# Patient Record
Sex: Female | Born: 1960
Health system: Southern US, Community
[De-identification: ages and names within clinical notes are randomized; demographics above are authoritative.]

## PROBLEM LIST (undated history)

## (undated) DIAGNOSIS — D219 Benign neoplasm of connective and other soft tissue, unspecified: Secondary | ICD-10-CM

## (undated) DIAGNOSIS — I10 Essential (primary) hypertension: Secondary | ICD-10-CM

## (undated) DIAGNOSIS — Z9289 Personal history of other medical treatment: Secondary | ICD-10-CM

## (undated) DIAGNOSIS — N182 Chronic kidney disease, stage 2 (mild): Secondary | ICD-10-CM

## (undated) DIAGNOSIS — K429 Umbilical hernia without obstruction or gangrene: Secondary | ICD-10-CM

## (undated) DIAGNOSIS — D473 Essential (hemorrhagic) thrombocythemia: Secondary | ICD-10-CM

## (undated) DIAGNOSIS — I5032 Chronic diastolic (congestive) heart failure: Secondary | ICD-10-CM

## (undated) DIAGNOSIS — Z923 Personal history of irradiation: Secondary | ICD-10-CM

## (undated) DIAGNOSIS — M25472 Effusion, left ankle: Secondary | ICD-10-CM

## (undated) DIAGNOSIS — I2510 Atherosclerotic heart disease of native coronary artery without angina pectoris: Secondary | ICD-10-CM

## (undated) DIAGNOSIS — I890 Lymphedema, not elsewhere classified: Secondary | ICD-10-CM

## (undated) DIAGNOSIS — M109 Gout, unspecified: Secondary | ICD-10-CM

## (undated) DIAGNOSIS — E785 Hyperlipidemia, unspecified: Secondary | ICD-10-CM

## (undated) DIAGNOSIS — I272 Pulmonary hypertension, unspecified: Secondary | ICD-10-CM

## (undated) DIAGNOSIS — I219 Acute myocardial infarction, unspecified: Secondary | ICD-10-CM

## (undated) DIAGNOSIS — I700 Atherosclerosis of aorta: Secondary | ICD-10-CM

## (undated) DIAGNOSIS — M79629 Pain in unspecified upper arm: Secondary | ICD-10-CM

## (undated) DIAGNOSIS — D75839 Thrombocytosis, unspecified: Secondary | ICD-10-CM

## (undated) DIAGNOSIS — D649 Anemia, unspecified: Secondary | ICD-10-CM

## (undated) DIAGNOSIS — M6530 Trigger finger, unspecified finger: Secondary | ICD-10-CM

## (undated) DIAGNOSIS — G8929 Other chronic pain: Secondary | ICD-10-CM

## (undated) DIAGNOSIS — C50919 Malignant neoplasm of unspecified site of unspecified female breast: Secondary | ICD-10-CM

## (undated) DIAGNOSIS — I8390 Asymptomatic varicose veins of unspecified lower extremity: Secondary | ICD-10-CM

## (undated) DIAGNOSIS — M1990 Unspecified osteoarthritis, unspecified site: Secondary | ICD-10-CM

## (undated) DIAGNOSIS — M25471 Effusion, right ankle: Secondary | ICD-10-CM

## (undated) DIAGNOSIS — I517 Cardiomegaly: Secondary | ICD-10-CM

## (undated) DIAGNOSIS — N179 Acute kidney failure, unspecified: Secondary | ICD-10-CM

## (undated) HISTORY — DX: Atherosclerotic heart disease of native coronary artery without angina pectoris: I25.10

## (undated) HISTORY — DX: Atherosclerosis of aorta: I70.0

## (undated) HISTORY — DX: Acute kidney failure, unspecified: N17.9

## (undated) HISTORY — PX: HERNIA REPAIR: SHX51

## (undated) HISTORY — DX: Chronic kidney disease, stage 2 (mild): N18.2

## (undated) HISTORY — DX: Malignant neoplasm of unspecified site of unspecified female breast: C50.919

## (undated) HISTORY — DX: Essential (primary) hypertension: I10

## (undated) HISTORY — DX: Hyperlipidemia, unspecified: E78.5

## (undated) HISTORY — DX: Chronic diastolic (congestive) heart failure: I50.32

## (undated) HISTORY — DX: Gout, unspecified: M10.9

## (undated) HISTORY — DX: Unspecified osteoarthritis, unspecified site: M19.90

## (undated) HISTORY — DX: Pain in unspecified upper arm: M79.629

## (undated) HISTORY — DX: Anemia, unspecified: D64.9

## (undated) HISTORY — DX: Pulmonary hypertension, unspecified: I27.20

## (undated) HISTORY — DX: Umbilical hernia without obstruction or gangrene: K42.9

## (undated) HISTORY — DX: Personal history of irradiation: Z92.3

## (undated) HISTORY — PX: OTHER SURGICAL HISTORY: SHX169

## (undated) HISTORY — DX: Personal history of other medical treatment: Z92.89

## (undated) HISTORY — DX: Acute myocardial infarction, unspecified: I21.9

---

## 1998-09-22 ENCOUNTER — Inpatient Hospital Stay (HOSPITAL_COMMUNITY): Admission: AD | Admit: 1998-09-22 | Discharge: 1998-09-22 | Payer: Self-pay | Admitting: Obstetrics

## 1998-09-24 ENCOUNTER — Ambulatory Visit (HOSPITAL_COMMUNITY): Admission: RE | Admit: 1998-09-24 | Discharge: 1998-09-24 | Payer: Self-pay | Admitting: *Deleted

## 1998-09-26 ENCOUNTER — Ambulatory Visit (HOSPITAL_COMMUNITY): Admission: RE | Admit: 1998-09-26 | Discharge: 1998-09-26 | Payer: Self-pay | Admitting: Obstetrics

## 1998-10-02 ENCOUNTER — Inpatient Hospital Stay (HOSPITAL_COMMUNITY): Admission: AD | Admit: 1998-10-02 | Discharge: 1998-10-02 | Payer: Self-pay | Admitting: Obstetrics

## 1999-05-03 ENCOUNTER — Emergency Department (HOSPITAL_COMMUNITY): Admission: EM | Admit: 1999-05-03 | Discharge: 1999-05-03 | Payer: Self-pay | Admitting: Emergency Medicine

## 2000-12-08 ENCOUNTER — Inpatient Hospital Stay (HOSPITAL_COMMUNITY): Admission: AD | Admit: 2000-12-08 | Discharge: 2000-12-08 | Payer: Self-pay | Admitting: Obstetrics & Gynecology

## 2003-01-20 ENCOUNTER — Emergency Department (HOSPITAL_COMMUNITY): Admission: EM | Admit: 2003-01-20 | Discharge: 2003-01-20 | Payer: Self-pay | Admitting: Emergency Medicine

## 2005-05-30 ENCOUNTER — Emergency Department (HOSPITAL_COMMUNITY): Admission: EM | Admit: 2005-05-30 | Discharge: 2005-05-30 | Payer: Self-pay | Admitting: Emergency Medicine

## 2005-09-27 ENCOUNTER — Emergency Department (HOSPITAL_COMMUNITY): Admission: EM | Admit: 2005-09-27 | Discharge: 2005-09-27 | Payer: Self-pay | Admitting: Emergency Medicine

## 2006-10-31 ENCOUNTER — Emergency Department (HOSPITAL_COMMUNITY): Admission: EM | Admit: 2006-10-31 | Discharge: 2006-10-31 | Payer: Self-pay | Admitting: Emergency Medicine

## 2007-01-12 ENCOUNTER — Emergency Department (HOSPITAL_COMMUNITY): Admission: EM | Admit: 2007-01-12 | Discharge: 2007-01-12 | Payer: Self-pay | Admitting: Emergency Medicine

## 2007-06-26 ENCOUNTER — Emergency Department (HOSPITAL_COMMUNITY): Admission: EM | Admit: 2007-06-26 | Discharge: 2007-06-26 | Payer: Self-pay | Admitting: Emergency Medicine

## 2007-09-08 ENCOUNTER — Emergency Department (HOSPITAL_COMMUNITY): Admission: EM | Admit: 2007-09-08 | Discharge: 2007-09-08 | Payer: Self-pay | Admitting: Emergency Medicine

## 2007-09-11 ENCOUNTER — Encounter (INDEPENDENT_AMBULATORY_CARE_PROVIDER_SITE_OTHER): Payer: Self-pay | Admitting: Gastroenterology

## 2007-09-11 ENCOUNTER — Inpatient Hospital Stay (HOSPITAL_COMMUNITY): Admission: EM | Admit: 2007-09-11 | Discharge: 2007-09-12 | Payer: Self-pay | Admitting: Emergency Medicine

## 2007-09-11 ENCOUNTER — Ambulatory Visit: Payer: Self-pay | Admitting: *Deleted

## 2007-10-03 ENCOUNTER — Ambulatory Visit: Payer: Self-pay | Admitting: Family Medicine

## 2007-10-04 ENCOUNTER — Ambulatory Visit: Payer: Self-pay | Admitting: *Deleted

## 2007-11-03 ENCOUNTER — Emergency Department (HOSPITAL_COMMUNITY): Admission: EM | Admit: 2007-11-03 | Discharge: 2007-11-03 | Payer: Self-pay | Admitting: Emergency Medicine

## 2007-11-06 ENCOUNTER — Ambulatory Visit: Payer: Self-pay | Admitting: Family Medicine

## 2007-11-06 ENCOUNTER — Encounter (INDEPENDENT_AMBULATORY_CARE_PROVIDER_SITE_OTHER): Payer: Self-pay | Admitting: Family Medicine

## 2007-11-06 LAB — CONVERTED CEMR LAB
Chlamydia, DNA Probe: NEGATIVE
GC Probe Amp, Genital: NEGATIVE

## 2007-11-08 ENCOUNTER — Inpatient Hospital Stay (HOSPITAL_COMMUNITY): Admission: EM | Admit: 2007-11-08 | Discharge: 2007-11-11 | Payer: Self-pay | Admitting: Emergency Medicine

## 2007-12-05 ENCOUNTER — Encounter: Admission: RE | Admit: 2007-12-05 | Discharge: 2007-12-05 | Payer: Self-pay | Admitting: Gastroenterology

## 2007-12-07 ENCOUNTER — Ambulatory Visit: Payer: Self-pay | Admitting: Family Medicine

## 2007-12-25 ENCOUNTER — Ambulatory Visit: Payer: Self-pay | Admitting: Family Medicine

## 2008-01-16 ENCOUNTER — Ambulatory Visit (HOSPITAL_COMMUNITY): Admission: RE | Admit: 2008-01-16 | Discharge: 2008-01-16 | Payer: Self-pay | Admitting: Family Medicine

## 2008-01-23 ENCOUNTER — Encounter: Admission: RE | Admit: 2008-01-23 | Discharge: 2008-01-23 | Payer: Self-pay | Admitting: Family Medicine

## 2008-02-16 ENCOUNTER — Ambulatory Visit: Payer: Self-pay | Admitting: Family Medicine

## 2008-03-07 ENCOUNTER — Ambulatory Visit (HOSPITAL_COMMUNITY): Admission: RE | Admit: 2008-03-07 | Discharge: 2008-03-07 | Payer: Self-pay | Admitting: Interventional Cardiology

## 2008-07-08 ENCOUNTER — Encounter (HOSPITAL_COMMUNITY): Admission: RE | Admit: 2008-07-08 | Discharge: 2008-09-19 | Payer: Self-pay | Admitting: Interventional Cardiology

## 2008-09-03 ENCOUNTER — Inpatient Hospital Stay (HOSPITAL_COMMUNITY): Admission: EM | Admit: 2008-09-03 | Discharge: 2008-09-06 | Payer: Self-pay | Admitting: Emergency Medicine

## 2008-09-03 ENCOUNTER — Ambulatory Visit: Payer: Self-pay | Admitting: *Deleted

## 2008-10-11 ENCOUNTER — Ambulatory Visit: Payer: Self-pay | Admitting: Family Medicine

## 2008-12-20 ENCOUNTER — Encounter (HOSPITAL_COMMUNITY): Admission: RE | Admit: 2008-12-20 | Discharge: 2009-03-20 | Payer: Self-pay | Admitting: Interventional Cardiology

## 2009-01-21 ENCOUNTER — Encounter (HOSPITAL_COMMUNITY): Admission: RE | Admit: 2009-01-21 | Discharge: 2009-04-21 | Payer: Self-pay | Admitting: Interventional Cardiology

## 2009-03-21 ENCOUNTER — Encounter (HOSPITAL_COMMUNITY): Admission: RE | Admit: 2009-03-21 | Discharge: 2009-04-26 | Payer: Self-pay | Admitting: Interventional Cardiology

## 2009-03-30 ENCOUNTER — Emergency Department (HOSPITAL_COMMUNITY): Admission: EM | Admit: 2009-03-30 | Discharge: 2009-03-30 | Payer: Self-pay | Admitting: Emergency Medicine

## 2009-07-31 ENCOUNTER — Ambulatory Visit: Payer: Self-pay | Admitting: Family Medicine

## 2009-07-31 LAB — CONVERTED CEMR LAB: Microalb, Ur: 0.51 mg/dL (ref 0.00–1.89)

## 2009-12-05 ENCOUNTER — Ambulatory Visit: Payer: Self-pay | Admitting: Family Medicine

## 2010-01-07 ENCOUNTER — Encounter (HOSPITAL_COMMUNITY): Admission: RE | Admit: 2010-01-07 | Discharge: 2010-04-07 | Payer: Self-pay | Admitting: Interventional Cardiology

## 2010-01-08 ENCOUNTER — Ambulatory Visit: Payer: Self-pay | Admitting: Family Medicine

## 2010-01-08 LAB — CONVERTED CEMR LAB
ALT: 17 units/L (ref 0–35)
CO2: 25 meq/L (ref 19–32)
Calcium: 9.5 mg/dL (ref 8.4–10.5)
Potassium: 4.1 meq/L (ref 3.5–5.3)
Rhuematoid fact SerPl-aCnc: <20 intl units/mL (ref 0–20)
Sed Rate: 14 mm/hr (ref 0–22)
Sodium: 138 meq/L (ref 135–145)
Total Bilirubin: 0.5 mg/dL (ref 0.3–1.2)
Total Protein: 7.3 g/dL (ref 6.0–8.3)
Uric Acid, Serum: 7.1 mg/dL — ABNORMAL HIGH (ref 2.4–7.0)
Vit D, 25-Hydroxy: 15 ng/mL — ABNORMAL LOW (ref 30–89)

## 2010-01-27 ENCOUNTER — Ambulatory Visit: Payer: Self-pay | Admitting: Family Medicine

## 2010-02-24 ENCOUNTER — Ambulatory Visit: Payer: Self-pay | Admitting: Internal Medicine

## 2010-02-26 ENCOUNTER — Ambulatory Visit: Payer: Self-pay | Admitting: Internal Medicine

## 2010-04-09 ENCOUNTER — Ambulatory Visit: Payer: Self-pay | Admitting: Family Medicine

## 2010-04-19 ENCOUNTER — Encounter (HOSPITAL_COMMUNITY): Admission: RE | Admit: 2010-04-19 | Discharge: 2010-07-18 | Payer: Self-pay | Admitting: Interventional Cardiology

## 2010-06-23 ENCOUNTER — Emergency Department (HOSPITAL_COMMUNITY): Admission: EM | Admit: 2010-06-23 | Discharge: 2010-06-23 | Payer: Self-pay | Admitting: Emergency Medicine

## 2010-12-20 HISTORY — PX: BREAST LUMPECTOMY: SHX2

## 2011-01-08 ENCOUNTER — Encounter (INDEPENDENT_AMBULATORY_CARE_PROVIDER_SITE_OTHER): Payer: Self-pay | Admitting: Family Medicine

## 2011-01-08 LAB — CONVERTED CEMR LAB
ALT: 25 units/L (ref 0–35)
AST: 22 units/L (ref 0–37)
Alkaline Phosphatase: 88 units/L (ref 39–117)
Creatinine, Ser: 1.13 mg/dL (ref 0.40–1.20)
Glucose, Bld: 84 mg/dL (ref 70–99)
HDL: 45 mg/dL (ref >39)
Potassium: 4.9 meq/L (ref 3.5–5.3)
Total Bilirubin: 0.6 mg/dL (ref 0.3–1.2)
Total CHOL/HDL Ratio: 3.4
Uric Acid, Serum: 7.1 mg/dL — ABNORMAL HIGH (ref 2.4–7.0)

## 2011-01-10 ENCOUNTER — Encounter: Payer: Self-pay | Admitting: Interventional Cardiology

## 2011-03-07 LAB — POCT I-STAT, CHEM 8
BUN: 10 mg/dL (ref 6–23)
Calcium, Ion: 1.13 mmol/L (ref 1.12–1.32)
Hemoglobin: 16.7 g/dL — ABNORMAL HIGH (ref 12.0–15.0)
Potassium: 3.1 mEq/L — ABNORMAL LOW (ref 3.5–5.1)
Sodium: 139 mEq/L (ref 135–145)
TCO2: 23 mmol/L (ref 0–100)

## 2011-03-07 LAB — URINALYSIS, ROUTINE W REFLEX MICROSCOPIC
Hgb urine dipstick: NEGATIVE
Ketones, ur: NEGATIVE mg/dL
Specific Gravity, Urine: 1.005 (ref 1.005–1.030)
pH: 6.5 (ref 5.0–8.0)

## 2011-03-07 LAB — CBC
MCH: 29 pg (ref 26.0–34.0)
MCHC: 33.4 g/dL (ref 30.0–36.0)
RDW: 17.4 % — ABNORMAL HIGH (ref 11.5–15.5)

## 2011-03-07 LAB — DIFFERENTIAL
Basophils Relative: 1 % (ref 0–1)
Lymphs Abs: 1.4 10*3/uL (ref 0.7–4.0)
Monocytes Absolute: 0.6 10*3/uL (ref 0.1–1.0)
Monocytes Relative: 8 % (ref 3–12)
Neutrophils Relative %: 70 % (ref 43–77)

## 2011-03-07 LAB — POCT CARDIAC MARKERS
Myoglobin, poc: 57.6 ng/mL (ref 12–200)
Troponin i, poc: <0.05 ng/mL (ref 0.00–0.09)

## 2011-05-04 NOTE — Cardiovascular Report (Signed)
NAMEDESTRY, BEZDEK               ACCOUNT NO.:  [id number]   MEDICAL RECORD NO.:  [id number]          PATIENT TYPE:  INP   LOCATION:  2907                         FACILITY:  [facility]   PHYSICIAN:  Lyn Records, M.D.   DATE OF BIRTH:  [birth year]   DATE OF PROCEDURE:  DATE OF DISCHARGE:                            CARDIAC CATHETERIZATION   REASON FOR PROCEDURE:  Acute inferior myocardial infarction.   PROCEDURES PERFORMED:  1. Left heart catheterization.  2. Selective coronary angiography.  3. Left ventriculography.  4. Right coronary artery bare-metal stent.  5. Angio-Seal.   DESCRIPTION:  After informed consent, the patient was brought direct to  the cath lab after stopping in the emergency room for several minutes.  She received 600 mg of Plavix in the emergency room.  She consented to  acute angioplasty.  A 6-French sheath was placed in the right femoral  artery after local Xylocaine infiltration.  A 6-French A2 multipurpose  catheter was used for hemodynamic recordings, left ventriculography by  hand injection, and selective left coronary angiography.  A Judkins 4 6-  French side-hole guide catheter was used for right coronary angiography.  We demonstrated that the right coronary was totally occluded and PCI was  then performed.  The patient had received a bolus followed by an  infusion of Angiomax after the sheath was placed.  ACT was documented to  be 381.   With some difficulty, we were able to get an [facility] wire across  the stenosis in the right coronary.  We then pre-dilated with 2.5 x 12  mm long Maverick balloon.  We then implanted a 2.75 x 24 mm long Liberty  stent.  We did not post-dilate but simply deployed the stent to 15  atmospheres, which was in effect a diameter of 3.0 mm.  On either end of  the stent, there was evidence of step up.  No flow limiting dissection  was noted.  The patient's symptoms completely resolved post-procedure.  ST segments came to  baseline.  Angio-Seal was used for arteriotomy  closure.   RESULTS:  1. Hemodynamic data:      a.     Aortic pressure 195/105.      b.     Left ventricular pressure 195/25.  2. Left ventriculography:  The left ventricle reveals inferior wall      akinesis.  EF is 60%.  No mitral regurgitation.  3. Coronary angiography.      a.     Left main coronary:  Left main is short.  Widely patent.      b.     Left anterior descending coronary:  Large vessel that wraps       around the apex, gives a diagonal.  The mid vessel has an       eccentric 70% stenosis.      c.     Circumflex artery:  Obtuse marginal branches widely patent.       No significant obstruction.      d.     Right coronary:  The right coronary is a dominant vessel,  gives a PDA.  The midvessel is 100% occluded.  Immediately       proximal to the 100% occlusion is an acute marginal branch that is       widely patent.  4. Percutaneous coronary intervention:  RCA midvessel 100% to 0% after      pre-dilatation, then deployment of a 2.75 x 24 Liberty that was      brought to 15 atmospheres, which was an effective diameter of 3.1      mm in diameter.  TIMI III flow was noted post-procedure.   CONCLUSION:  1. Acute inferior myocardial infarction successfully treated with      angioplasty and stenting of the mid right coronary from 100% to 0%.  2. 70% mid LAD.  3. Inferior wall hypokinesis/akinesis.  EF 60%.   PLAN:  1. Angiomax x2 hours.  2. Plavix daily.  3. Aspirin.  4. Beta-blocker therapy.  5. Statin therapy.      Lyn Records, M.D.  Electronically Signed     HWS/MEDQ  D:  11/08/2007  T:  11/08/2007  Job:  [id number]   cc:   Maurice March, M.D.

## 2011-05-04 NOTE — Discharge Summary (Signed)
[name], [name]               ACCOUNT NO.:  [id number]   MEDICAL RECORD NO.:  [id number]          PATIENT TYPE:  INP   LOCATION:  6704                         FACILITY:  [facility]   PHYSICIAN:  Jason Coop, MD DATE OF BIRTH:  [birth year]   DATE OF ADMISSION:  09/11/2007  DATE OF DISCHARGE:  09/12/2007                               DISCHARGE SUMMARY   DISCHARGE MEDICATIONS:  HCTZ 25 mg p.o. daily.   DISPOSITION:  [name] is discharged home.  She will see her physician  [facility] on September 26.  She needs to follow up with an EKG  for assessment a prolongation of QTC, as her QTC was borderline to  minimally elevated at the time of discharge.  She was to follow up for  any persistent symptoms of diarrhea and black stool.  Also note she has  been advised to avoid taking aspirin or any other nonsteroidal  antiinflammatory drugs without talking to a doctor or pharmacist.  She  also needs to follow up for any persisting symptoms of chest pain.   PROCEDURE:  Acute abdominal series.  This was done on September 11, 2007.  It was positive for colonic air-fluid levels consistent with  given history of diarrhea.   CONSULTATIONS:  None.   HISTORY OF PRESENT ILLNESS:  [name] is a 50 year old African  American lady with a past medical history of hypertension, tobacco  abuse, and significant family history for cardiac event who came for  diarrhea since 13 hours prior to admission.  It is 4-5 times since it  started and has been dark black when it started, but there is no frank  red blood.  Patient had nausea but no vomiting.  She had some crampy  pain in her flanks bilaterally.  She ran out of her Blood Pressure  medications for the last 3-4 days and she has been taking a regular  aspirin for her blood pressure, as well as for her headache.  She had  about 24 tablets of aspirin over the last 3-4 days.  She was having  chest pain below left breast and on the right side for  the last 3-4  days.  It is sharp and increased by deep breaths and movement.  It is  8/10 and intense.  It is not related to exertion and lasts a couple of  minutes and goes away and occurs many times in a day.  She was treated  in the ER 3 days prior to admission and discharged home for chest pain.  Her pain is decreased after taking morphine from 10 to 3/10.   PHYSICAL EXAMINATION:  VITAL SIGNS:  Temperature 98.2, pulse 76,  respirations 25, blood pressure 121/51, saturation 98% on 2 liters.  GENERAL:  She is sleepy.  CHEST:  Clear to auscultation bilaterally.  HEART:  S1 and S2 normal.  Regular rate and rhythm.  Grade I systolic  murmur.  ABDOMEN:  Bowel sounds increased.  Soft, nontender.  No organomegaly.  OROPHARYNX:  Very mild dehydration.   LABORATORY DATA:  WBC 10.9, hemoglobin 13.5, platelets 764, MCV  84.7.  Sodium 139, potassium 2.2, chloride 99, bicarb 33.6, BUN 14, creatinine  1.2, glucose 105.  FOBT positive.  Urinalysis - protein 30, magnesium  2.2, albumin 3.  Urine drug screen is positive for benzodiazepine and  cocaine.  Patient had some benzodiazepine in the ER.  Lipase 24.  EKG  when potassium was 2.2 was sinus rhythm with T inversion in VL and V1 to  V3 and corrected QTC prolonged at 695 msec.  Second EKG done on  September 22 when her potassium is 2.8 shows sinus rhythm with inverted  T in lead VL creating QTC normalizing at 425 msec.  Point of care  cardiac markers CK-MB 2.6, troponin I less than 0.05, and myoglobin 258.   HOSPITAL COURSE:  1. Chest pain and cardiac enzymes.  Repeat EKG was done, which was      negative for any change and the corrected QTc interal later      normalized when her potassium was normal.  Repeat cardiac enzymes      were negative except for one set in which CK-MB was elevated at      4.9.  Her chest pain improved.  I think the chest pain is more      likely gastrointestinal because of gastric irritation from overuse      of  aspirin.  2. Diarrhea and black stool.  Her diarrhea abated after she was in the      hospital.  She did not have any more black stool.  According to      nurse from the ER she had some green stool.  We sent the stool for      C. difficile studies and it was negative. She was treated with IV      fluids, Protonix b.i.d.  We also had a gastroenterology consult      concerning the history of black stool and FOBT positive.  Dr. Ewing Schlein      did upper GI endoscopy which was positive for antritis.  Biopsies      taken and are pending on coming Friday on September 26.  3. Hypokalemia secondary to diarrhea.  Her potassium has been      repleted, both IV and p.o. and her potassium at the time of      discharge is 4.4.  Her QTC, which was prolonged on the first EKG      was most probably due to hypokalemia and it is right now resolving,      but still borderline high.  4. Hypertension.  We hold her HCTZ because she was having diarrhea, so      we are starting her back on HCTZ at the time of discharge.   DISCHARGE LABORATORY:  WBC 8.8, hemoglobin 12.7, MCV 86.2, RDW 2.9.  Sodium 157, potassium 4.4, chloride 102, bicarb 22, glucose 102, BUN 2,  creatinine 0.78, and calcium 8.   VITAL SIGNS:  Temperature 98.2, pulse 88, respirations 20, blood  pressure 130/81, saturation 100% on room air.   CONDITION ON DISCHARGE:  No diarrhea, vomiting, or abdominal pain.  No  chest pain.      Jason Coop, MD  Electronically Signed     YP/MEDQ  D:  09/12/2007  T:  09/12/2007  Job:  [id number]   cc:   Melvern Banker

## 2011-05-04 NOTE — H&P (Signed)
[name], ZELLARS               ACCOUNT NO.:  [id number]   MEDICAL RECORD NO.:  [id number]          PATIENT TYPE:  INP   LOCATION:  1824                         FACILITY:  [facility]   PHYSICIAN:  Unice Cobble, MD     DATE OF BIRTH:  1961-05-04   DATE OF ADMISSION:  09/03/2008  DATE OF DISCHARGE:                              HISTORY & PHYSICAL   CARDIOLOGIST:  Dr. Verdis Prime from [facility].   CHIEF COMPLAINT:  Chest pain.   HISTORY OF PRESENT ILLNESS:  This is a 50 year old African American  female with a history of STEMI in November 2008, who presents with chest  pain.  The patient has had no angina over the last year and Sunday used  marijuana laced with cocaine.  She then had chest pain onset last night  at 2330, which radiated to her left arm and associated diaphoresis and  shortness of breath.  She had no presyncope and no nausea.  The pain  lasted intensely for several hours and with nitroglycerin diminished to  the point where it is now tolerable.  The pain, however, currently is  not absent.  She has no symptoms of congestive heart failure.   PAST MEDICAL HISTORY:  1. Coronary artery disease, status post STEMI on November 08, 2007,      with a PCI to the mid RCA with a Liberte stent.  At that time she      had 70% mid LAD lesion as well as a normal ejection fraction.  2. Hypertension.  3. Hyperlipidemia.   ALLERGIES:  No known drug allergies.   MEDICATIONS:  1. Plavix 75 mg daily.  2. Aspirin 81 mg daily.  3. Diovan/hydrochlorothiazide 320/25 mg daily.  4. Metoprolol ER 100 mg daily.  5. Potassium 20 mEq daily.  6. Sublingual nitroglycerin as needed.  7. Zantac 150 mg b.i.d.  8. Lipitor 20 mg daily.   SOCIAL HISTORY:  She lives in [place] with her fiance.  She owns a  cleaning business.  She is smoking five cigarettes per day and has cut  down significantly from her past extensive use.  No alcohol.  Drugs  include marijuana and cocaine.   FAMILY  HISTORY:  Her mother had a heart attack in her 50s and her father  had a heart attack in his 55s.  She has also had a sibling with an MI.   REVIEW OF SYSTEMS:  A complete review of systems was done and found to  be otherwise negative except as stated in the HPI.   PHYSICAL EXAMINATION:  Temperature is 96.3 with a pulse of 67,  respiratory rate is 22, blood pressure 128/89, O2 sat is 100% on 2 L.  GENERAL:  This is an obese African American female in no acute distress.  HEENT:  NCAT, PERRLA, EOMI, MMM, oropharynx without erythema or  exudates.  NECK:  Supple without lymphadenopathy, thyromegaly, bruits or jugular  venous distention.  HEART:  Regular rate and rhythm with a normal S1 and S2, without  murmurs, gallops or rubs.  Normal PMI.  Pulses 2+ and equal bilaterally  without bruits.  LUNGS:  Clear to auscultation bilaterally without wheezes, rhonchi or  rales.  ABDOMEN:  Soft and nontender with normal bowel sounds and no rebound or  guarding.  No hepatosplenomegaly.  EXTREMITIES:  No cyanosis, clubbing or edema.  MUSCULOSKELETAL:  No joint deformity, effusions or spine or CVA  tenderness.  NEUROLOGIC:  She is alert and oriented x3 with cranial nerves II-XII  grossly intact.  Strength is 5/5 all extremities and axial groups.  Normal sensation throughout.   RADIOLOGY:  A chest x-ray with no acute cardiopulmonary disease.   EKG has a rate of 70 with normal sinus rhythm.  She has anterior Q-waves  and nonspecific ST and T-wave changes.   Her labs show a platelet count of 785 but a white count of 10.8 and a  hemoglobin of 14.6.  Her potassium is 2.9 and her creatinine is 1.2.  CK-  MB and troponin are negative.   ASSESSMENT/PLAN:  This is a 50 year old African American female with a  history of ST elevation myocardial infarction in November 2008  presenting with chest pain in the setting of cocaine use.  The patient  states that the cocaine was a one-time thing.  She has never  used it  before and does not intend to use it in the future.  She will need to be  ruled out for myocardial infarction and catheterization will need to be  considered to evaluate her stent in the right coronary artery as well as  the 70% left anterior descending artery lesion known from prior  catheterization.  It is possible that this is all coronary vasospasm  from cocaine use, and that should be taken into account when determining  her invasive therapy.  I will not stop her beta blocker at this time, as  she has been taking it throughout the period and has not had any issues  so far.  Nitroglycerin drip will be started for her ongoing chest pain  and a heparin drip will be started for her unstable angina.  Otherwise,  her remaining meds will be continued.      Unice Cobble, MD  Electronically Signed     ACJ/MEDQ  D:  09/03/2008  T:  09/03/2008  Job:  [id number]

## 2011-05-04 NOTE — Discharge Summary (Signed)
[name], [name]               ACCOUNT NO.:  [id number]   MEDICAL RECORD NO.:  [id number]          PATIENT TYPE:  INP   LOCATION:  2005                         FACILITY:  [facility]   PHYSICIAN:  Lyn Records, M.D.   DATE OF BIRTH:  1961/10/11   DATE OF ADMISSION:  11/08/2007  DATE OF DISCHARGE:  11/11/2007                               DISCHARGE SUMMARY   DISCHARGE DIAGNOSIS:  1. Acute inferior myocardial infarction status post bare metal stent      to the right coronary artery November 08, 2007.  2. Hypertension.  3. Hyperlipidemia, treated.   HOSPITAL COURSE:  [name] is a 50 year old female who awoke around  5:00 a.m. on the day of admission with substernal chest pressure,  nausea, and vomiting.  She called EMS and en route she was noted to have  NST stent elevated myocardial infarction.  The cath lab team was  notified, and the patient was taken emergently to the cardiac  catheterization lab.   The patient was found to have a mid total right coronary artery lesion  and a bare metal stent was placed to this area resulting in 0%  postprocedure with TIMI III flow.  The LAD had mid-eccentric 70% focal  lesion all other vessels were patent.  She was kept on the floor over  the next several days with the plan for an additional stress Cardiolite  in 4-6 weeks to assess the LAD lesion.  She was discharged home on  November 11, 2007 in stable but improved condition.   LAB STUDIES:  During the patient's hospital stay include white count  10.1, hemoglobin 12.6 and 38.3, platelets 649.  Sodium 136, potassium  3.4 (potassium was repleted), BUN 8, creatinine 0.85, maximum CK 528  with MB fraction of 60.1, troponin 9.23, total cholesterol 175,  triglycerides 130, HDL 43, LDL 106.  TSH 1.320, CRP 8.8.  EKG post  demise showed Q-waves involving the inferior leads with T-wave inversion  inferolaterally.   The patient is to increase activity as tolerated.  Return to see Dr.  Katrinka Blazing on  November 22, 2007 at 11:30 a.m.  Follow up with Dr. Audria Nine in  1 week, call for that appointment.   MEDICATIONS:  1. Plavix 75 mg daily for at least 1 year.  2. Enteric-coated aspirin 81 mg daily.  3. Diovan/HCTZ 160/25 mg 1 tablet daily.  4. Protonix 40 mg a day.  5. Metoprolol ER 100 mg.  6. Potassium 20 mEq daily.  7. Simvastatin 40 mg daily.  8. Sublingual nitroglycerin p.r.n. chest pain.      Guy Franco, P.A.      Lyn Records, M.D.  Electronically Signed    LB/MEDQ  D:  11/30/2007  T:  12/01/2007  Job:  [id number]   cc:   Maurice March, M.D.  Lyn Records, M.D.

## 2011-05-04 NOTE — Op Note (Signed)
[name], [name]               ACCOUNT NO.:  [id number]   MEDICAL RECORD NO.:  [id number]          PATIENT TYPE:  INP   LOCATION:  6704                         FACILITY:  [facility]   PHYSICIAN:  Petra Kuba, M.D.    DATE OF BIRTH:  20-Oct-1961   DATE OF PROCEDURE:  09/11/2007  DATE OF DISCHARGE:                               OPERATIVE REPORT   PROCEDURE:  Esophagogastroduodenoscopy with biopsy.   INDICATION:  Black diarrhea, lots of aspirin.  Consent was signed after  risks, benefits, methods, options thoroughly discussed prior to any  sedation given.   MEDICINES USED:  Fentanyl 30 mcg, Versed 3 mg.   PROCEDURE:  The video endoscope was inserted by direct vision.  Esophagus was normal.  Scope was advanced into the stomach and advanced  to the antrum where a mild to moderate amount of antritis an few antral  erosions were seen.  Scope passed through a normal pylorus into a normal  duodenal bulb around the C-loop to a normal second portion of the  duodenum.  Scope was withdrawn back to the bulb and a good look there  ruled out ulcers in that location.  Scope was withdrawn back to stomach  and retroflexed.  Angularis, cardia, fundus, lesser and greater curve  were all normal on retroflex visualization.  Straight visualization of  the stomach confirmed the antritis, ruled out any other abnormalities.  Two biopsies of the antrum, two of the proximal stomach were obtained to  rule out Helicobacter.  Air was suctioned, scope slowly withdrawn.  Again a good look at the esophagus was normal.  Scope was removed.  The  patient tolerated the procedure well.  There was no obvious immediate  complication.   ENDOSCOPIC DIAGNOSES:  1. 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  2. Otherwise within normal limits esophagogastroduodenoscopy, status      post proximal gastric biopsy to rule out Helicobacter as well      without signs of active bleeding.   PLAN:  Await pathology, pump  inhibitors.  No aspirin or nonsteroidals.  Follow clinically and decide any further workup plans are needed.           ______________________________  Petra Kuba, M.D.     MEM/MEDQ  D:  09/11/2007  T:  09/12/2007  Job:  [id number]   cc:   Manning Charity, MD

## 2011-05-04 NOTE — Cardiovascular Report (Signed)
[name], [name]               ACCOUNT NO.:  [id number]   MEDICAL RECORD NO.:  [id number]          PATIENT TYPE:  INP   LOCATION:  6533                         FACILITY:  [facility]   PHYSICIAN:  Lyn Records, M.D.   DATE OF BIRTH:  07-17-1961   DATE OF PROCEDURE:  09/05/2008  DATE OF DISCHARGE:                            CARDIAC CATHETERIZATION   INDICATIONS FOR PROCEDURE:  Recurrent chest pain, possibly angina, prior  history of STEMI in November 2008, PCI with bare-metal stent, mid RCA.  Also, intrinsic LAD disease, not treated at the time.   PROCEDURES PERFORMED:  1. Left heart catheterization.  2. Selective coronary angiography.  3. Left ventriculography.  4. Drug-eluting stent, left anterior descending.  5. Angio-Seal.   DESCRIPTION:  After informed consent, a 6-French sheath was placed in  the right femoral artery using the modified Seldinger technique.  A 6-  [place] A2 multipurpose catheter was then used for hemodynamic  recordings, left ventriculography by hand injection, and left coronary  angiography.  We subsequently performed right coronary angiography with  JR-4 catheter.   After reviewing the angiograms, we identified moderate restenosis from  the bare-metal stent in the mid RCA and progression of disease in the  mid-LAD to high-grade segmental 90% stenosis.   After some discussion, we decided to proceed with LAD PCI.  Because of  restenosis in the right coronary, we felt that PCI with DES was  necessary.  We used an XB 3.5, 6-French guide system for the left  coronary.  An Clinical cytogeneticist guidewire was used.  Integrilin and IV  heparin was used for anticoagulation.  Integrilin was used by standard  protocol.  Heparin was 0.5 units/kg.   We predilated the LAD with a 2.5 x 15 mm long Voyager balloon and  deployed a 3.0 x 23 mm PROMUS stent to 9 atmospheres.  We then  postdilated with a 20-mm long 3.0 x 20 Disney Ranger to 12 atmospheres and  the proximal one-half  of the vessel was postdilated with a 3.5 x 12 [place]  Voyager to 15 atmospheres.  The patient had chest discomfort with each  balloon inflation, it was similar to any chest discomfort she has had.   Angio-Seal was used for hemostasis.   RESULTS:  1. Hemodynamic data:      a.     Aortic pressure 183/105.      b.     Left ventricular pressure 187/5.  2. Left ventriculography:  Left ventricle was normal in size.  EF 70%.      No wall motion abnormality.  3. Coronary angiography:      a.     Left main coronary:  Widely patent.      b.     Left anterior descending coronary:  The midvessel contains       segmental high-grade obstruction up to 90%.      c.     Circumflex artery:  With irregularities but no significant       obstructions.      d.     Right coronary:  The midvessel contains  segmental 50%       narrowing and diffuse pattern within the previously placed right       coronary stent, which at this point appears to be undersized for       the vessel which on either side appears to be 3.5.  The stent       placed was postdilated to 3.0.  4. PCI:  The LAD stent deployment as described above, reducing 90%      stenosis to 0% with TIMI grade 3 flow.  5. Angio-Seal with good hemostasis.   CONCLUSIONS:  1. Successful DES in the LAD 90-0%  2. Moderate in-stent restenosis in the bare-metal stent in the mid      right coronary up to 60% narrowing.  3. Normal LV function.   PLAN:  Aspirin and Plavix for a year.  Further evaluation pending.  If  any recurrent chest pain, may need to treat the right coronary with DES.      Lyn Records, M.D.  Electronically Signed     HWS/MEDQ  D:  09/05/2008  T:  09/05/2008  Job:  [id number]

## 2011-05-07 NOTE — Discharge Summary (Signed)
[name], [name]               ACCOUNT NO.:  [id number]   MEDICAL RECORD NO.:  [id number]          PATIENT TYPE:  INP   LOCATION:  6533                         FACILITY:  [facility]   PHYSICIAN:  Lyn Records, M.D.   DATE OF BIRTH:  November 07, 1961   DATE OF ADMISSION:  09/03/2008  DATE OF DISCHARGE:  09/06/2008                               DISCHARGE SUMMARY   DISCHARGE DIAGNOSES:  1. Coronary artery disease status post drug-eluting stent to the left      anterior descending, 60% diffuse in-stent restenosis of the mid      right coronary artery.  2. Hypertension.  3. Hyperlipidemia.  4. Smoker, smoking cessation counseling provided.   HOSPITAL COURSE:  [name] is a 50 year old female who had suffered an  ST-segment elevated myocardial infarction in November 2008.  She began  having substernal chest pain on Sunday prior to the September 03, 2008,  and used marijuana laced with cocaine.  She then had a sudden onset of  substernal chest pain at 11:30 p.m. that radiated into her left arm.  This was associated with diaphoresis and shortness of breath.  The pain  lasted intensely for several hours but improved with nitroglycerin.   By enzyme, she ruled out for myocardial infarction.  She had no  recurrent chest pain but then suddenly revealed to Dr. Katrinka Blazing that she  had taken more than 25 nitroglycerin tablets over the past 2 months.  For these reasons, a diagnostic cardiac catheterization was warranted.  Results are as follows:  Left main normal, LAD with a 90-95% mid  subsegmental stenosis, circumflex was showing only minor irregularities,  right coronary artery had a segmental 50-60% in-stent restenosis,  diffuse pattern.  She then underwent percutaneous intervention utilizing  a drug-eluting stent to the LAD.   She remained in the hospital overnight, we felt she will go home the  next day.  We did instruct her to stop smoking.  We also reinforced  phase II cardiac  rehabilitation.   Lab studies during her hospitalization included urine pregnancy which  was negative, cardiac isoenzymes which were normal, total cholesterol  127, triglycerides 138, HDL 43, LDL 56, potassium 3.1, sodium 142, BUN  14, creatinine 1.2.   DISCHARGE MEDICATIONS:  1. Plavix 75 mg a day indefinitely.  2. Enteric-coated aspirin 325 mg a day.  3. Diovan/hydrochlorothiazide 320/25 mg daily.  4. Metoprolol ER 100 mg a day.  5. Potassium 20 mEq a day.  6. Zantac daily.  7. Lipitor 20 mg daily.  8. Sublingual nitroglycerin p.r.n. chest pain.   ACTIVITY:  No restrictions.   Remain on a low-sodium heart-healthy diet.   Return to see Dr. Katrinka Blazing in 1-2 weeks, the office will call.  She was  instructed not to smoke or utilize any illicit drugs.      Guy Franco, P.A.      Lyn Records, M.D.  Electronically Signed    LB/MEDQ  D:  10/16/2008  T:  10/17/2008  Job:  [id number]   cc:   Lyn Records, M.D.

## 2011-06-14 ENCOUNTER — Other Ambulatory Visit: Payer: Self-pay | Admitting: Family Medicine

## 2011-06-14 DIAGNOSIS — N6320 Unspecified lump in the left breast, unspecified quadrant: Secondary | ICD-10-CM

## 2011-06-14 DIAGNOSIS — N644 Mastodynia: Secondary | ICD-10-CM

## 2011-06-18 ENCOUNTER — Other Ambulatory Visit: Payer: Self-pay | Admitting: Family Medicine

## 2011-06-18 ENCOUNTER — Ambulatory Visit
Admission: RE | Admit: 2011-06-18 | Discharge: 2011-06-18 | Disposition: A | Discharge status: Home / Self Care | Payer: Self-pay | Source: Ambulatory Visit | Attending: Family Medicine | Admitting: Family Medicine

## 2011-06-18 DIAGNOSIS — N6320 Unspecified lump in the left breast, unspecified quadrant: Secondary | ICD-10-CM

## 2011-06-18 DIAGNOSIS — N644 Mastodynia: Secondary | ICD-10-CM

## 2011-06-24 ENCOUNTER — Ambulatory Visit
Admission: RE | Admit: 2011-06-24 | Discharge: 2011-06-24 | Disposition: A | Discharge status: Home / Self Care | Payer: No Typology Code available for payment source | Source: Ambulatory Visit | Attending: Family Medicine | Admitting: Family Medicine

## 2011-06-24 ENCOUNTER — Other Ambulatory Visit: Payer: Self-pay | Admitting: Family Medicine

## 2011-06-24 ENCOUNTER — Ambulatory Visit
Admission: RE | Admit: 2011-06-24 | Discharge: 2011-06-24 | Disposition: A | Discharge status: Home / Self Care | Payer: Self-pay | Source: Ambulatory Visit | Attending: Family Medicine | Admitting: Family Medicine

## 2011-06-24 ENCOUNTER — Other Ambulatory Visit: Payer: Self-pay | Admitting: Diagnostic Radiology

## 2011-06-24 DIAGNOSIS — N644 Mastodynia: Secondary | ICD-10-CM

## 2011-06-24 DIAGNOSIS — N6320 Unspecified lump in the left breast, unspecified quadrant: Secondary | ICD-10-CM

## 2011-07-01 ENCOUNTER — Ambulatory Visit
Admission: RE | Admit: 2011-07-01 | Discharge: 2011-07-01 | Disposition: A | Discharge status: Home / Self Care | Payer: No Typology Code available for payment source | Source: Ambulatory Visit | Attending: Family Medicine | Admitting: Family Medicine

## 2011-07-01 ENCOUNTER — Other Ambulatory Visit: Payer: Self-pay | Admitting: Family Medicine

## 2011-07-01 ENCOUNTER — Ambulatory Visit (HOSPITAL_COMMUNITY): Payer: No Typology Code available for payment source

## 2011-07-01 ENCOUNTER — Other Ambulatory Visit (HOSPITAL_COMMUNITY): Payer: Self-pay | Admitting: Diagnostic Radiology

## 2011-07-01 ENCOUNTER — Other Ambulatory Visit: Payer: Self-pay | Admitting: Diagnostic Radiology

## 2011-07-01 DIAGNOSIS — R921 Mammographic calcification found on diagnostic imaging of breast: Secondary | ICD-10-CM

## 2011-07-01 DIAGNOSIS — N6320 Unspecified lump in the left breast, unspecified quadrant: Secondary | ICD-10-CM

## 2011-07-02 ENCOUNTER — Other Ambulatory Visit: Payer: Self-pay | Admitting: Family Medicine

## 2011-07-02 ENCOUNTER — Other Ambulatory Visit: Payer: Self-pay | Admitting: Internal Medicine

## 2011-07-02 DIAGNOSIS — C50912 Malignant neoplasm of unspecified site of left female breast: Secondary | ICD-10-CM

## 2011-07-05 ENCOUNTER — Ambulatory Visit (HOSPITAL_COMMUNITY)
Admission: RE | Admit: 2011-07-05 | Discharge: 2011-07-05 | Disposition: A | Discharge status: Home / Self Care | Payer: No Typology Code available for payment source | Source: Ambulatory Visit | Attending: Family Medicine | Admitting: Family Medicine

## 2011-07-05 ENCOUNTER — Other Ambulatory Visit (HOSPITAL_COMMUNITY): Payer: Self-pay | Admitting: Family Medicine

## 2011-07-05 DIAGNOSIS — C50912 Malignant neoplasm of unspecified site of left female breast: Secondary | ICD-10-CM

## 2011-07-05 DIAGNOSIS — C50919 Malignant neoplasm of unspecified site of unspecified female breast: Secondary | ICD-10-CM | POA: Insufficient documentation

## 2011-07-05 DIAGNOSIS — Z01818 Encounter for other preprocedural examination: Secondary | ICD-10-CM | POA: Insufficient documentation

## 2011-07-05 DIAGNOSIS — W311XXA Contact with metalworking machines, initial encounter: Secondary | ICD-10-CM

## 2011-07-05 MED ORDER — GADOBENATE DIMEGLUMINE 529 MG/ML IV SOLN
19.0000 mL | Freq: Once | INTRAVENOUS | Status: AC | PRN
  Administered 2011-07-05: 19 mL via INTRAVENOUS

## 2011-07-06 ENCOUNTER — Emergency Department (HOSPITAL_COMMUNITY)
Admission: EM | Admit: 2011-07-06 | Discharge: 2011-07-06 | Disposition: A | Discharge status: Home / Self Care | Payer: No Typology Code available for payment source | Attending: Emergency Medicine | Admitting: Emergency Medicine

## 2011-07-06 ENCOUNTER — Emergency Department (HOSPITAL_COMMUNITY): Payer: No Typology Code available for payment source

## 2011-07-06 DIAGNOSIS — S139XXA Sprain of joints and ligaments of unspecified parts of neck, initial encounter: Secondary | ICD-10-CM | POA: Insufficient documentation

## 2011-07-06 DIAGNOSIS — Y92410 Unspecified street and highway as the place of occurrence of the external cause: Secondary | ICD-10-CM | POA: Insufficient documentation

## 2011-07-06 DIAGNOSIS — M47812 Spondylosis without myelopathy or radiculopathy, cervical region: Secondary | ICD-10-CM | POA: Insufficient documentation

## 2011-07-06 DIAGNOSIS — I10 Essential (primary) hypertension: Secondary | ICD-10-CM | POA: Insufficient documentation

## 2011-07-06 DIAGNOSIS — I252 Old myocardial infarction: Secondary | ICD-10-CM | POA: Insufficient documentation

## 2011-07-06 DIAGNOSIS — T148XXA Other injury of unspecified body region, initial encounter: Secondary | ICD-10-CM | POA: Insufficient documentation

## 2011-07-07 ENCOUNTER — Encounter: Payer: No Typology Code available for payment source | Admitting: Oncology

## 2011-07-07 ENCOUNTER — Ambulatory Visit (HOSPITAL_BASED_OUTPATIENT_CLINIC_OR_DEPARTMENT_OTHER): Payer: No Typology Code available for payment source | Admitting: Surgery

## 2011-07-07 DIAGNOSIS — C50919 Malignant neoplasm of unspecified site of unspecified female breast: Secondary | ICD-10-CM

## 2011-07-07 NOTE — Progress Notes (Signed)
Subjective:     Patient ID: [name], female   DOB: [birth year], 50 y.o.   MRN: [id number]  HPI   Review of Systems     Objective:   Physical Exam     Assessment:     No show     Plan:     reschedule

## 2011-07-09 ENCOUNTER — Encounter: Payer: No Typology Code available for payment source | Admitting: Genetic Counselor

## 2011-07-12 ENCOUNTER — Encounter: Payer: No Typology Code available for payment source | Admitting: Genetic Counselor

## 2011-07-14 ENCOUNTER — Encounter (HOSPITAL_BASED_OUTPATIENT_CLINIC_OR_DEPARTMENT_OTHER): Payer: Self-pay | Admitting: Oncology

## 2011-07-14 ENCOUNTER — Other Ambulatory Visit: Payer: Self-pay | Admitting: Oncology

## 2011-07-14 ENCOUNTER — Ambulatory Visit (HOSPITAL_BASED_OUTPATIENT_CLINIC_OR_DEPARTMENT_OTHER): Payer: No Typology Code available for payment source | Admitting: General Surgery

## 2011-07-14 ENCOUNTER — Encounter (INDEPENDENT_AMBULATORY_CARE_PROVIDER_SITE_OTHER): Payer: Self-pay | Admitting: General Surgery

## 2011-07-14 VITALS — BP 138/87 | HR 69 | Temp 98.5°F | Resp 20 | Ht 65.0 in | Wt 222.0 lb

## 2011-07-14 DIAGNOSIS — C50919 Malignant neoplasm of unspecified site of unspecified female breast: Secondary | ICD-10-CM

## 2011-07-14 DIAGNOSIS — C50419 Malignant neoplasm of upper-outer quadrant of unspecified female breast: Secondary | ICD-10-CM

## 2011-07-14 DIAGNOSIS — R9430 Abnormal result of cardiovascular function study, unspecified: Secondary | ICD-10-CM

## 2011-07-14 LAB — CBC WITH DIFFERENTIAL/PLATELET
BASO%: 0.1 % (ref 0.0–2.0)
Eosinophils Absolute: 0.2 10*3/uL (ref 0.0–0.5)
HCT: 42.6 % (ref 34.8–46.6)
LYMPH%: 18 % (ref 14.0–49.7)
MCHC: 33.6 g/dL (ref 31.5–36.0)
MCV: 87.1 fL (ref 79.5–101.0)
MONO#: 0.6 10*3/uL (ref 0.1–0.9)
NEUT%: 74.8 % (ref 38.4–76.8)
Platelets: 667 10*3/uL — ABNORMAL HIGH (ref 145–400)
WBC: 10.8 10*3/uL — ABNORMAL HIGH (ref 3.9–10.3)

## 2011-07-14 LAB — COMPREHENSIVE METABOLIC PANEL

## 2011-07-14 NOTE — Patient Instructions (Signed)
Dr Harrold Fitchett's office will call you to schedule surgery

## 2011-07-14 NOTE — Progress Notes (Signed)
Subjective:   New Dx Cancer of the Breast  Patient ID: [name], female   DOB: 08-Jun-1961, 50 y.o.   MRN: [id number]  HPI Patient is a 50 year old female seen today in [facility] for a new Dx of breast cancer.  She states that 4-5 months ago she initially felt a lump in in her upper left breast just above the nipple.  She recently presented to the breast center for evaluation.she had last had a mammogram in 2009.  Initial mammogram showed a new area of ill-defined density in the superior left breast several centimeters in diameter. In addition to this there were numerous pleomorphic calcifications extending extensively into the upper outer quadrant of the breast. Ultrasound revealed a 3.4 cm hypoechoic mass in the area of the density. Enlarged axillary lymph nodes were also noted. The largest lymph node measured 5.4 cm in size. Subsequent large core needle biopsy was performed of the breast mass and the lymph node. The mass and the lymph node biopsy were positive for invasive mammary carcinoma, high-grade . A second left breast mass seen on ultrasound at the 2:00 position was also biopsied showing only fibrocystic disease. The extensive calcifications in the upper-outer quadrant of the left breast were not biopsied but are felt to be highly suspicious. Mammogram of the right breast showed 2 adjacent areas of pleomorphic calcifications about 3 cm apart. 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  Past Medical History  Diagnosis Date  . Hypertension   . Arthritis     knees  . Myocardial infarction    Past Surgical History  Procedure Date  . Stents   . Hernia repair Umbilical   Current Outpatient Prescriptions  Medication Sig Dispense Refill  . amLODipine (NORVASC) 10 MG tablet Take 10 mg by mouth daily.        . clopidogrel (PLAVIX) 75 MG tablet Take 75 mg by mouth daily.        . isosorbide dinitrate (ISORDIL) 30 MG tablet  Take 30 mg by mouth 4 (four) times daily.        . metoprolol (TOPROL-XL) 100 MG 24 hr tablet Take 100 mg by mouth daily.        Marland Kitchen POTASSIUM CHLORIDE CR PO Take by mouth daily.        Allergies no known allergies History  Substance Use Topics  . Smoking status: Former Smoker    Types: Cigarettes  . Smokeless tobacco: Not on file  . Alcohol Use: Yes   Review of Systems General: She has had some recent weight gain Eyes: She complains of some blurry vision HEENT: Some seasonal allergies Heart: No recent chest pain palpitations or swelling GI: No abdominal pain nausea vomiting change in bowel habits or blood in stools. She has not had a colonoscopy Breasts: As above Orifices she has some chronic knee pain Endocrine: No diabetes or thyroid problems. She is premenopausal.    Objective:   Physical Exam General: Overweight African American female in no acute distress Skin: No rash or infection HEENT: No palpable masses or thyromegaly. Sclera nonicteric. Pupils equal round and reactive. Oropharynx clear. Lymph nodes: No cervical or supraclavicular nodes palpable. See breast exam below. Breasts: Large breasts bilaterally. In the upper left breast at the 12:00 position just above the nipple is an approximately 5 cm firm freely movable palpable mass. There may be some thickening toward the upper outer quadrant of the breast but  this is Subtle there is palpable left axillary adenopathy with one particularly large node about 5 cm. This is freely movable. I cannot feel any masses in the right breast. No palpable right axillary adenopathy. No skin changes. Cardiac: Regular rate and rhythm without murmur. Peripheral pulses intact. No JVD or edema. Abdomen: Obese soft nontender without palpable masses or organomegaly. There is a well-healed. Umbilical incision. Extremities: No joint deformity or swelling Neurologic: Alert fully oriented motor and sensory exams grossly normal.    Assessment:       50 year old female with a new diagnosis of bilateral breast cancer. On the left she has locally advanced disease with a 7 cm mass and very worrisome extension of microcalcifications extending extensively into the upper outer quadrant on mammogram and extensive nodularity in this area on MRI. On the right she has 2 separate areas of microcalcifications. One of these has been proven to be lobular carcinoma in situ. She also has biopsy-proven left axillary adenopathy. With this constellation of findings I believe she would be best served with bilateral mastectomy. She could conceivably have breast conservation on the right but with multiple areas of malignancy as well as her family history she would be most comfortable with bilateral mastectomy.    Plan:     We will plan initial surgical therapy with bilateral mastectomy. She will have a modified mastectomy with lymph node dissection on the left and we will plan a sentinel lymph node biopsy on the right. We will place a Port-A-Cath at the time of her surgery. We discussed the surgery in detail including its indications, expected recovery, and risks of anesthetic complications, cardiopulmonary complications, bleeding infection and wound healing problems. Port problems including pneumothorax infection thrombosis and displacement were discussed. She understands and agrees to proceed.

## 2011-07-15 ENCOUNTER — Other Ambulatory Visit (INDEPENDENT_AMBULATORY_CARE_PROVIDER_SITE_OTHER): Payer: Self-pay | Admitting: General Surgery

## 2011-07-15 DIAGNOSIS — C50911 Malignant neoplasm of unspecified site of right female breast: Secondary | ICD-10-CM

## 2011-07-19 ENCOUNTER — Encounter: Payer: No Typology Code available for payment source | Admitting: Genetic Counselor

## 2011-07-21 ENCOUNTER — Other Ambulatory Visit (HOSPITAL_COMMUNITY): Payer: No Typology Code available for payment source

## 2011-07-21 HISTORY — PX: OTHER SURGICAL HISTORY: SHX169

## 2011-07-26 ENCOUNTER — Other Ambulatory Visit (HOSPITAL_COMMUNITY): Payer: No Typology Code available for payment source

## 2011-07-27 ENCOUNTER — Other Ambulatory Visit (HOSPITAL_COMMUNITY): Payer: No Typology Code available for payment source

## 2011-07-29 ENCOUNTER — Encounter (HOSPITAL_COMMUNITY)
Admission: RE | Admit: 2011-07-29 | Discharge: 2011-07-29 | Disposition: A | Discharge status: Home / Self Care | Payer: Medicaid Other | Source: Ambulatory Visit | Attending: General Surgery | Admitting: General Surgery

## 2011-07-29 ENCOUNTER — Other Ambulatory Visit (INDEPENDENT_AMBULATORY_CARE_PROVIDER_SITE_OTHER): Payer: Self-pay | Admitting: General Surgery

## 2011-07-29 DIAGNOSIS — C50919 Malignant neoplasm of unspecified site of unspecified female breast: Secondary | ICD-10-CM

## 2011-07-29 LAB — SURGICAL PCR SCREEN
MRSA, PCR: NEGATIVE
Staphylococcus aureus: NEGATIVE

## 2011-07-29 LAB — BASIC METABOLIC PANEL
CO2: 26 mEq/L (ref 19–32)
Chloride: 100 mEq/L (ref 96–112)
Glucose, Bld: 81 mg/dL (ref 70–99)
Potassium: 4 mEq/L (ref 3.5–5.1)
Sodium: 137 mEq/L (ref 135–145)

## 2011-07-29 LAB — CBC
Hemoglobin: 14.7 g/dL (ref 12.0–15.0)
MCH: 29.2 pg (ref 26.0–34.0)
MCHC: 34.9 g/dL (ref 30.0–36.0)
MCV: 83.5 fL (ref 78.0–100.0)
RBC: 5.04 MIL/uL (ref 3.87–5.11)

## 2011-07-29 LAB — HCG, SERUM, QUALITATIVE: Preg, Serum: NEGATIVE

## 2011-07-30 ENCOUNTER — Telehealth (INDEPENDENT_AMBULATORY_CARE_PROVIDER_SITE_OTHER): Payer: Self-pay | Admitting: General Surgery

## 2011-07-30 ENCOUNTER — Telehealth (INDEPENDENT_AMBULATORY_CARE_PROVIDER_SITE_OTHER): Payer: Self-pay

## 2011-07-30 NOTE — Telephone Encounter (Signed)
Per [facility]he[facility] at Short stay pt must have cardiac clearance per Dr Saverio Danker in anesthesia. I called Dr Michaelle Copas office to see if we could get cardiac clearance. I was informed by his nurse that pt is no longer seen by their office. Last ov was April 2011. I requested ov asap and was told they would not make this pt an appt with their office. I reviewed this with Dr Johna Sheriff and per his request am getting most recent note from Dr Katrinka Blazing faxed over for review. I have left msg for pt to call. Need to know if she has seen any other cardiologist since last ov with Dr Katrinka Blazing.

## 2011-07-30 NOTE — Telephone Encounter (Signed)
Cell cut off

## 2011-07-30 NOTE — Telephone Encounter (Signed)
Dr. Johna Sheriff spoke with Dr. Myrtis Ser today to try and get an urgent appointment for cardiac clearance.  Records were faxed to his office.

## 2011-08-02 ENCOUNTER — Encounter (HOSPITAL_COMMUNITY)
Admission: RE | Admit: 2011-08-02 | Discharge: 2011-08-02 | Disposition: A | Discharge status: Home / Self Care | Payer: Self-pay | Source: Ambulatory Visit | Attending: Oncology | Admitting: Oncology

## 2011-08-02 ENCOUNTER — Encounter (HOSPITAL_COMMUNITY): Payer: Self-pay

## 2011-08-02 DIAGNOSIS — I2510 Atherosclerotic heart disease of native coronary artery without angina pectoris: Secondary | ICD-10-CM | POA: Insufficient documentation

## 2011-08-02 DIAGNOSIS — R599 Enlarged lymph nodes, unspecified: Secondary | ICD-10-CM | POA: Insufficient documentation

## 2011-08-02 DIAGNOSIS — C773 Secondary and unspecified malignant neoplasm of axilla and upper limb lymph nodes: Secondary | ICD-10-CM | POA: Insufficient documentation

## 2011-08-02 DIAGNOSIS — C50919 Malignant neoplasm of unspecified site of unspecified female breast: Secondary | ICD-10-CM

## 2011-08-02 DIAGNOSIS — D259 Leiomyoma of uterus, unspecified: Secondary | ICD-10-CM | POA: Insufficient documentation

## 2011-08-02 MED ORDER — IOHEXOL 300 MG/ML  SOLN
80.0000 mL | Freq: Once | INTRAMUSCULAR | Status: AC | PRN
  Administered 2011-08-02: 80 mL via INTRAVENOUS

## 2011-08-03 ENCOUNTER — Ambulatory Visit (HOSPITAL_COMMUNITY): Admission: RE | Admit: 2011-08-03 | Payer: Self-pay | Source: Ambulatory Visit | Admitting: General Surgery

## 2011-08-03 ENCOUNTER — Other Ambulatory Visit (HOSPITAL_COMMUNITY): Payer: No Typology Code available for payment source

## 2011-08-03 ENCOUNTER — Encounter: Payer: Self-pay | Admitting: Cardiology

## 2011-08-03 ENCOUNTER — Ambulatory Visit (INDEPENDENT_AMBULATORY_CARE_PROVIDER_SITE_OTHER): Payer: Self-pay | Admitting: Cardiology

## 2011-08-03 DIAGNOSIS — I10 Essential (primary) hypertension: Secondary | ICD-10-CM

## 2011-08-03 DIAGNOSIS — I2510 Atherosclerotic heart disease of native coronary artery without angina pectoris: Secondary | ICD-10-CM

## 2011-08-03 DIAGNOSIS — Z01810 Encounter for preprocedural cardiovascular examination: Secondary | ICD-10-CM

## 2011-08-03 DIAGNOSIS — C50919 Malignant neoplasm of unspecified site of unspecified female breast: Secondary | ICD-10-CM | POA: Insufficient documentation

## 2011-08-03 DIAGNOSIS — I700 Atherosclerosis of aorta: Secondary | ICD-10-CM | POA: Insufficient documentation

## 2011-08-03 DIAGNOSIS — E785 Hyperlipidemia, unspecified: Secondary | ICD-10-CM

## 2011-08-03 DIAGNOSIS — M1990 Unspecified osteoarthritis, unspecified site: Secondary | ICD-10-CM | POA: Insufficient documentation

## 2011-08-03 DIAGNOSIS — R9430 Abnormal result of cardiovascular function study, unspecified: Secondary | ICD-10-CM | POA: Insufficient documentation

## 2011-08-03 MED ORDER — NITROGLYCERIN 0.4 MG SL SUBL: 0.4000 mg | SUBLINGUAL_TABLET | SUBLINGUAL | Status: DC | PRN

## 2011-08-03 NOTE — Assessment & Plan Note (Signed)
Lipids are being treated. No change in therapy. 

## 2011-08-03 NOTE — Patient Instructions (Signed)
Your physician recommends that you schedule a follow-up appointment in: as needed  

## 2011-08-03 NOTE — Assessment & Plan Note (Signed)
Blood pressure is controlled. No change in therapy. 

## 2011-08-03 NOTE — Assessment & Plan Note (Signed)
The patient's cardiac status is stable.  She does have a history of coronary disease.  She's not having symptoms.  She had a nuclear scan with no ischemia in 2010.  It is safe to proceed with her breast surgery.  It is okay to hold her Plavix.  My preference would be to keep her on aspirin if possible.  It is not possible or aspirin can be stopped.  I would plan to start both her medications back after the surgery when possible.
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
## 2011-08-05 ENCOUNTER — Encounter (HOSPITAL_COMMUNITY): Payer: Self-pay

## 2011-08-05 ENCOUNTER — Encounter (HOSPITAL_COMMUNITY)
Admission: RE | Admit: 2011-08-05 | Discharge: 2011-08-05 | Disposition: A | Discharge status: Home / Self Care | Payer: Medicaid Other | Source: Ambulatory Visit | Attending: Oncology | Admitting: Oncology

## 2011-08-05 DIAGNOSIS — D259 Leiomyoma of uterus, unspecified: Secondary | ICD-10-CM | POA: Insufficient documentation

## 2011-08-05 DIAGNOSIS — C50919 Malignant neoplasm of unspecified site of unspecified female breast: Secondary | ICD-10-CM | POA: Insufficient documentation

## 2011-08-05 DIAGNOSIS — C773 Secondary and unspecified malignant neoplasm of axilla and upper limb lymph nodes: Secondary | ICD-10-CM | POA: Insufficient documentation

## 2011-08-05 LAB — GLUCOSE, CAPILLARY: Glucose-Capillary: 94 mg/dL (ref 70–99)

## 2011-08-05 MED ORDER — FLUDEOXYGLUCOSE F - 18 (FDG) INJECTION
14.6000 | Freq: Once | INTRAVENOUS | Status: AC | PRN
  Administered 2011-08-05: 14.6 via INTRAVENOUS

## 2011-08-10 ENCOUNTER — Ambulatory Visit (HOSPITAL_COMMUNITY): Payer: Medicaid Other

## 2011-08-10 ENCOUNTER — Other Ambulatory Visit (INDEPENDENT_AMBULATORY_CARE_PROVIDER_SITE_OTHER): Payer: Self-pay | Admitting: General Surgery

## 2011-08-10 ENCOUNTER — Ambulatory Visit (HOSPITAL_COMMUNITY)
Admission: RE | Admit: 2011-08-10 | Discharge: 2011-08-10 | Disposition: A | Discharge status: Home / Self Care | Payer: Medicaid Other | Source: Ambulatory Visit | Attending: General Surgery | Admitting: General Surgery

## 2011-08-10 ENCOUNTER — Inpatient Hospital Stay (HOSPITAL_COMMUNITY)
Admission: RE | Admit: 2011-08-10 | Discharge: 2011-08-13 | DRG: 580 | Disposition: A | Discharge status: Home / Self Care | Payer: Medicaid Other | Source: Ambulatory Visit | Attending: General Surgery | Admitting: General Surgery

## 2011-08-10 DIAGNOSIS — C50919 Malignant neoplasm of unspecified site of unspecified female breast: Secondary | ICD-10-CM

## 2011-08-10 DIAGNOSIS — C50911 Malignant neoplasm of unspecified site of right female breast: Secondary | ICD-10-CM

## 2011-08-10 DIAGNOSIS — Z7902 Long term (current) use of antithrombotics/antiplatelets: Secondary | ICD-10-CM

## 2011-08-10 DIAGNOSIS — E669 Obesity, unspecified: Secondary | ICD-10-CM | POA: Diagnosis present

## 2011-08-10 DIAGNOSIS — M129 Arthropathy, unspecified: Secondary | ICD-10-CM | POA: Diagnosis present

## 2011-08-10 DIAGNOSIS — I10 Essential (primary) hypertension: Secondary | ICD-10-CM | POA: Diagnosis present

## 2011-08-10 DIAGNOSIS — Z9861 Coronary angioplasty status: Secondary | ICD-10-CM

## 2011-08-10 DIAGNOSIS — I2510 Atherosclerotic heart disease of native coronary artery without angina pectoris: Secondary | ICD-10-CM | POA: Diagnosis present

## 2011-08-10 DIAGNOSIS — Z01818 Encounter for other preprocedural examination: Secondary | ICD-10-CM

## 2011-08-10 DIAGNOSIS — D62 Acute posthemorrhagic anemia: Secondary | ICD-10-CM | POA: Diagnosis not present

## 2011-08-10 DIAGNOSIS — F17200 Nicotine dependence, unspecified, uncomplicated: Secondary | ICD-10-CM | POA: Diagnosis present

## 2011-08-10 DIAGNOSIS — I252 Old myocardial infarction: Secondary | ICD-10-CM

## 2011-08-10 DIAGNOSIS — Z79899 Other long term (current) drug therapy: Secondary | ICD-10-CM

## 2011-08-10 DIAGNOSIS — Z01812 Encounter for preprocedural laboratory examination: Secondary | ICD-10-CM

## 2011-08-10 MED ORDER — TECHNETIUM TC 99M SULFUR COLLOID FILTERED
1.0000 | Freq: Once | INTRAVENOUS | Status: AC | PRN
  Administered 2011-08-10: 1 via INTRADERMAL

## 2011-08-11 LAB — CBC
MCV: 85.5 fL (ref 78.0–100.0)
Platelets: 599 10*3/uL — ABNORMAL HIGH (ref 150–400)
RDW: 16 % — ABNORMAL HIGH (ref 11.5–15.5)
WBC: 11 10*3/uL — ABNORMAL HIGH (ref 4.0–10.5)

## 2011-08-11 LAB — HEMOGLOBIN AND HEMATOCRIT, BLOOD
HCT: 27.9 % — ABNORMAL LOW (ref 36.0–46.0)
Hemoglobin: 9.2 g/dL — ABNORMAL LOW (ref 12.0–15.0)

## 2011-08-12 LAB — HEMOGLOBIN AND HEMATOCRIT, BLOOD: Hemoglobin: 8.4 g/dL — ABNORMAL LOW (ref 12.0–15.0)

## 2011-08-13 LAB — HEMOGLOBIN AND HEMATOCRIT, BLOOD
HCT: 23.7 % — ABNORMAL LOW (ref 36.0–46.0)
Hemoglobin: 8 g/dL — ABNORMAL LOW (ref 12.0–15.0)

## 2011-08-16 NOTE — Discharge Summary (Signed)
[name], [name]               ACCOUNT NO.:  [id number]  MEDICAL RECORD NO.:  [id number]  LOCATION:  5120                         FACILITY:  [facility]  PHYSICIAN:  Lorne Skeens. Cloris Flippo, M.D.DATE OF BIRTH:  28-Jun-1961  DATE OF ADMISSION:  08/10/2011 DATE OF DISCHARGE:  08/13/2011                              DISCHARGE SUMMARY   DISCHARGE DIAGNOSIS:  Breast cancer.  OPERATIVE PROCEDURE:  Left modified mastectomy and right total mastectomy with sentinel lymph node biopsy and placement of Port-A-Cath on August 10, 2011.  HISTORY OF PRESENT ILLNESS:  This patient is a 50 year old female who has felt a lump in her upper left breast about 4-5 months.  She presented to the breast imaging center for evaluation.  Mammogram revealed an ill-defined mass in the upper left breast and extensive pleomorphic calcifications in the upper outer quadrant of the breast. Ultrasound showed approximately 4-cm hypoechoic mass in the upper breast and enlarged axillary lymph nodes up to 5.4 cm.  Biopsy of the mass and lymph node positive for high-grade mammary carcinoma.  Right breast mammogram also showed 2 adjacent areas of pleomorphic calcifications 3 cm apart.  One was biopsied revealing lobular carcinoma in situ.  After extensive discussion of options preoperatively, the patient has elected bilateral mastectomy.  She was admitted for these procedures.  PAST MEDICAL HISTORY:  She has history of myocardial infarction and stent placement.  Also, hypertension, arthritis, and umbilical hernia repair.  MEDICATIONS ON ADMISSION: 1. Norvasc 10 daily. 2. Plavix 75 daily. 3. Isosorbide 30 mg 4 times a day. 4. Metoprolol 100 mg daily. 5. Potassium chloride.  ALLERGIES:  No known drug allergies.  SOCIAL HISTORY:  Significant for continued cigarette abuse.  REVIEW OF SYSTEMS:  See detailed H and P.  PERTINENT PHYSICAL EXAM:  GENERAL:  Significant for obesity. BREASTS:  Revealed a firm, freely movable  mass in the upper left breast above the nipple and also a 4-5 cm palpable node in the left axilla, also freely movable.  HOSPITAL COURSE:  The patient was admitted on the morning of her procedure.  She underwent a left modified mastectomy and right total mastectomy with sentinel lymph node biopsy that was negative on frozen section and placement of a Port-A-Cath.  There was no unusual bleeding during the procedure.  However, noted postop on the first night, she had somewhat excessive bloody output from all 3 JP drains.  She remained stable, however.  There was no hematoma or swelling beneath the flaps. Hemoglobin fell from around 13 preop to 9.8 on the first postoperative day.  By the second postoperative day, the JP drainage had decreased significantly, but was still bloody and hemoglobin was down to 8.4.  She was feeling better.  Vital signs were all within normal limits.  On the third postoperative day, she has no complaints.  Heart rate is 81, blood pressure 173/70.  JP drainage has decreased significantly and is beginning to clear and hemoglobin is 8.0.  It appears that her bleeding has stopped.  Her Plavix was held preoperatively for 7 days and she will hold it for 5 days after discharge.  Medications are otherwise unchanged plus Tylox for pain and she will take an  iron supplement twice daily. She has been instructed on JP care.  Follow up in my office in 1 week.     Lorne Skeens. Zayquan Bogard, M.D.     [name]  D:  08/13/2011  T:  08/13/2011  Job:  [id number]  Electronically Signed by Glenna Fellows M.D. on 08/16/2011 11:26:20 AM

## 2011-08-16 NOTE — Op Note (Signed)
[place], MOSS               ACCOUNT NO.:  [id number]  MEDICAL RECORD NO.:  [id number]  LOCATION:  5125                         FACILITY:  [facility]  PHYSICIAN:  Lorne Skeens. Glory Graefe, M.D.DATE OF BIRTH:  02/02/1961  DATE OF PROCEDURE:  08/10/2011 DATE OF DISCHARGE:                              OPERATIVE REPORT   PREOPERATIVE DIAGNOSIS:  Bilateral breast cancer.  POSTOPERATIVE DIAGNOSIS:  Bilateral breast cancer.  SURGICAL PROCEDURES: 1. Left modified mastectomy. 2. Blue dye injection, right breast. 3. Right total mastectomy with sentinel lymph node biopsy. 4. Placement of subcutaneous venous port via right subclavian vein.  SURGEON:  Lorne Skeens. Teralyn Mullins, MD  ANESTHESIA:  General.  ASSISTANT:  Currie Paris, MD  BRIEF HISTORY:  The patient is a 50 year old female with a recent diagnosis of breast cancer.  She has been found to have a large, approximately 4-cm mass in the mid left breast with extensive microcalcifications as well in the upper breast on biopsy.  Also, she has a 5-cm left axillary lymph node.  Biopsy of both of these areas have revealed high-grade invasive mammary carcinoma.  On the right, she has 2 separate areas of microcalcifications that appear suspicious.  One has been biopsied showing lobular carcinoma in situ.  The patient additionally has a strong family history of breast cancer.  After extensive discussion regarding treatment options and multidisciplinary consultation with Medical and Radiation Oncology, we have elected with the patient's input to proceed with bilateral mastectomy which will include axillary dissection on the left and sentinel lymph node biopsy on the right as well as placement of Port-A-Cath for chemotherapy.  We discussed the indications, procedure, possible alternatives, risks of general anesthesia, cardiorespiratory complications, wound healing problems, bleeding, infection specific to the port,  thrombosis, pneumothorax, port dysfunction.  Following injection of 1 mCi of technetium sulfur colloid intradermally around the right nipple about an hour before the procedure.  The patient brought to the operating room for these procedures.  DESCRIPTION OF OPERATION:  The patient was brought to operating room, placed in supine position on the table and general orotracheal anesthesia was induced.  PAS were placed.  She was given preoperative IV antibiotics.  She was carefully position with both arms extended on arm boards and the entire chest, upper abdomen, shoulder, neck, and breast were widely sterilely prepped and draped.  Correct patient and procedure were verified.  The left side was approached initially.  An elliptical incision was used encompassing the central breast skin and nipple- areolar complex extending up toward the axilla.  Dissection was carried down to the subcutaneous tissue.  Skin and subcutaneous flaps were then raised superiorly toward the clavicle, inferiorly towards the edge of the rectus, medially to the edge of the sternum and laterally out to the anterior border latissimus dorsi which was dissected free and defined. The breast tissue was then dissected up off the chest wall with cautery, working medial to lateral.  This freed from the pectoralis major and the lateral border of pectoralis major.  This dissected off the serratus and then out laterally to the anterior inferior border of the latissimus. The dissection was then carried superiorly along the low serratus and latissimus up  toward the axilla.  The breast tissue was then reflected off the lateral edge of pectoralis major and the pectoralis minor was defined and the clavipectoral fascia incised just lateral to the pectoralis minor entering the axilla proper.  Pectoralis minor and major retracted laterally, and with careful blunt dissection, the axillary vein was identified.  Beginning at about the medial  edge of pectoralis minor, all fibrofatty tissue inferior to the axillary vein was swept laterally and inferiorly.  Perforating branches of the axillary artery and vein were divided between clips.  The intercostal nerve running to the axillary contents was divided.  Dissection was carried laterally along the axillary vein, and then laterally the thoracodorsal vessels and nerve were identified and were carefully protected.  Working back medial along the chest wall, the long thoracic nerve was also identified and protected.  All fibrofatty fatty tissue then between the two structures down to the subscapularis was swept inferiorly.  With the nerves protected, final attachments along the serratus and along the anterior border of the latissimus were divided.  The specimen removed. There was a large at least 5 or 6 cm node in the axilla, but it was low in the axilla and there was no gross disease up high around the axillary vein or in level I nodes.  I really did not see any other markedly abnormal lymph nodes in the axilla.  Very clean at the end of the dissection.  The wound was irrigated and complete hemostasis obtained. Closed suction drains were brought through two separate stab wounds in the inferior flap and left in the axilla and under the superior flap. The subcutaneous tissue was then closed with interrupted 3-0 Vicryl and the skin closed with staples.  Following this, attention was turned to the right side.  Initially, 5 mL of dilute methylene blue diluted with 5 mL of saline was injected subcutaneously around the right nipple and massaged for about a minute.  An incision was then made identical to the right side and flaps were raised in an identical fashion.  Shorter flap was raised over the axilla and then the NeoProbe was used to localize the definite hot area in the axilla.  Using cautery and blunt dissection with the NeoProbe for guidance, dissection was deepened into the  axilla and a hot blue lymph node was encountered.  This was completely dissected free with cautery.  Ex vivo of the node had counts about 3000, the background in the axilla was less than 30.  This was sent for frozen section evaluation.  While the lymph node was being evaluated, the breast tissue was reflected up off the chest wall, pectoralis major, and serratus working out laterally to the isthmus dorsi and was dissected off the anterior border latissimus dorsi and serratus working up toward the axilla until the breast was freed down to the junction of the axillary tail and the axillary contents.  The report on the lymph node returned as negative.  We then came across the low axilla with Kelly clamps tying this with 3-0 Vicryl and the specimen was removed, sent for permanent pathology.  The wound was thoroughly irrigated.  A single closed suction drains brought out through an inferior stab wound and left beneath the flaps.  The subcu was closed with interrupted 3-0 Vicryl and the skin was closed with staples.  Following this all, gloves and gowns were changed.  We also changed gloves, gowns, and instrument between the 2 mastectomies.  The patient was repositioned with arms  tucked and reprepped and draped sterilely prepping the anterior chest, neck, and shoulders.  The right subclavian vein was then cannulated without difficulty with the needle and guidewire confirmed by fluoroscopy.  The introducer was placed over the guidewire without difficulty and guidewire removed and then the flushed catheter placed via the introducer which was slipped away and its tip positioned at the cavoatrial junction confirmed by fluoroscopy.  A small transverse incision was made in the right anterior chest.  A subcutaneous pocket created.  The most inferior portion of this pocket did enter the mastectomy cavity.  However, there was a good secure subcutaneous pocket to place the port.  The catheter was  tunneled subcutaneously to this pocket, trimmed to length and attached the port which was then sutured to the deep subcutaneous tissue with 2 interrupted Prolene sutures. Fluoroscopy was then again performed showing good position of the catheter at the cavoatrial junction.  The port site wound was closed with subcutaneous interrupted 4-0 Monocryl, subcuticular Monocryl, and Dermabond, and the small intersect of subclavian closed with subcuticular, Monocryl, and Dermabond.  Sponges and needle counts correct.  Dressings were applied after the catheter was noted to flush and aspirate easily and was flushed with concentrated heparin.  The patient taken recovery in good condition.     Lorne Skeens. Ruhani Umland, M.D.     Tory Emerald  D:  08/10/2011  T:  08/11/2011  Job:  [id number]  Electronically Signed by Glenna Fellows M.D. on 08/16/2011 11:26:14 AM

## 2011-08-19 ENCOUNTER — Telehealth (INDEPENDENT_AMBULATORY_CARE_PROVIDER_SITE_OTHER): Payer: Self-pay

## 2011-08-19 NOTE — Telephone Encounter (Signed)
C/O numbness under both " arm pits"- Bilateral breast cancer  Surgery 08/10/2011. Patient has drains placed #1 & #2 has dark red drainage, #3 has light red drainage.  No fever, no warmth to the touch noted- no increase in the amount of swelling note by patient.  Post op appointment is tomorrow 08/20/2011.  I told patient numbness is uncommon after that type of surgery, however I will report this to Dr. Johna Sheriff. RMP

## 2011-08-20 ENCOUNTER — Ambulatory Visit (INDEPENDENT_AMBULATORY_CARE_PROVIDER_SITE_OTHER): Payer: PRIVATE HEALTH INSURANCE | Admitting: General Surgery

## 2011-08-20 ENCOUNTER — Encounter (INDEPENDENT_AMBULATORY_CARE_PROVIDER_SITE_OTHER): Payer: Self-pay | Admitting: General Surgery

## 2011-08-20 VITALS — HR 104 | Temp 97.0°F | Ht 65.0 in | Wt 214.4 lb

## 2011-08-20 DIAGNOSIS — IMO0002 Reserved for concepts with insufficient information to code with codable children: Secondary | ICD-10-CM

## 2011-08-20 DIAGNOSIS — Z9889 Other specified postprocedural states: Secondary | ICD-10-CM

## 2011-08-20 NOTE — Patient Instructions (Signed)
May leave bandage off.  Change band aid over drain sites daily

## 2011-08-20 NOTE — Progress Notes (Signed)
Patient returns approximately 10 days following bilateral mastectomy with lymph node dissection on the left and sentinel node biopsy on the right. She is having some pain. She feels a little weak and dizzy at times. Overall doing reasonably well.  On examination both wounds are healing very nicely and left the staples in for now. She has 3 JP drains in the still had somewhat bloody drainage and are draining 20-30 cc per day each. These are all left in.  We reviewed her pathology. On the left she had a 5.7 cm grade 3 invasive ductal cancer and there were 2 of 18 lymph nodes positive. On the right were multiple foci of invasive lobular carcinoma.  She had some drop in her hemoglobin during hospitalization and we will recheck a CBC. She is on iron. She has an appointment at the cancer Center next week. She will return here next week.

## 2011-08-20 NOTE — Progress Notes (Signed)
Addended by: Maryan Puls on: 08/20/2011 04:07 PM   Modules accepted: Orders

## 2011-08-21 LAB — CBC WITH DIFFERENTIAL/PLATELET
Basophils Absolute: 0.1 10*3/uL (ref 0.0–0.1)
Eosinophils Absolute: 0.3 10*3/uL (ref 0.0–0.7)
Eosinophils Relative: 2 % (ref 0–5)
HCT: 30.3 % — ABNORMAL LOW (ref 36.0–46.0)
Lymphocytes Relative: 16 % (ref 12–46)
MCH: 28.6 pg (ref 26.0–34.0)
MCV: 87.6 fL (ref 78.0–100.0)
Monocytes Absolute: 0.8 10*3/uL (ref 0.1–1.0)
RDW: 16.7 % — ABNORMAL HIGH (ref 11.5–15.5)
WBC: 16.4 10*3/uL — ABNORMAL HIGH (ref 4.0–10.5)

## 2011-08-22 ENCOUNTER — Inpatient Hospital Stay (HOSPITAL_COMMUNITY)
Admission: EM | Admit: 2011-08-22 | Discharge: 2011-08-25 | DRG: 683 | Disposition: A | Discharge status: Home / Self Care | Payer: Medicaid Other | Attending: Internal Medicine | Admitting: Internal Medicine

## 2011-08-22 ENCOUNTER — Emergency Department (HOSPITAL_COMMUNITY): Payer: Medicaid Other

## 2011-08-22 DIAGNOSIS — R55 Syncope and collapse: Secondary | ICD-10-CM | POA: Diagnosis present

## 2011-08-22 DIAGNOSIS — E876 Hypokalemia: Secondary | ICD-10-CM | POA: Diagnosis present

## 2011-08-22 DIAGNOSIS — Z7902 Long term (current) use of antithrombotics/antiplatelets: Secondary | ICD-10-CM

## 2011-08-22 DIAGNOSIS — K5289 Other specified noninfective gastroenteritis and colitis: Secondary | ICD-10-CM | POA: Diagnosis present

## 2011-08-22 DIAGNOSIS — I2510 Atherosclerotic heart disease of native coronary artery without angina pectoris: Secondary | ICD-10-CM | POA: Diagnosis present

## 2011-08-22 DIAGNOSIS — N179 Acute kidney failure, unspecified: Principal | ICD-10-CM | POA: Diagnosis present

## 2011-08-22 DIAGNOSIS — C50919 Malignant neoplasm of unspecified site of unspecified female breast: Secondary | ICD-10-CM | POA: Diagnosis present

## 2011-08-22 DIAGNOSIS — D649 Anemia, unspecified: Secondary | ICD-10-CM | POA: Diagnosis present

## 2011-08-22 DIAGNOSIS — Z7982 Long term (current) use of aspirin: Secondary | ICD-10-CM

## 2011-08-22 DIAGNOSIS — D72829 Elevated white blood cell count, unspecified: Secondary | ICD-10-CM | POA: Diagnosis present

## 2011-08-22 DIAGNOSIS — E871 Hypo-osmolality and hyponatremia: Secondary | ICD-10-CM | POA: Diagnosis present

## 2011-08-22 DIAGNOSIS — I10 Essential (primary) hypertension: Secondary | ICD-10-CM | POA: Diagnosis present

## 2011-08-22 DIAGNOSIS — F17200 Nicotine dependence, unspecified, uncomplicated: Secondary | ICD-10-CM | POA: Diagnosis present

## 2011-08-22 DIAGNOSIS — I252 Old myocardial infarction: Secondary | ICD-10-CM

## 2011-08-22 DIAGNOSIS — E7800 Pure hypercholesterolemia, unspecified: Secondary | ICD-10-CM | POA: Diagnosis present

## 2011-08-22 LAB — CBC
MCHC: 33.6 g/dL (ref 30.0–36.0)
Platelets: 799 10*3/uL — ABNORMAL HIGH (ref 150–400)
RDW: 16.2 % — ABNORMAL HIGH (ref 11.5–15.5)
WBC: 20.8 10*3/uL — ABNORMAL HIGH (ref 4.0–10.5)

## 2011-08-22 LAB — DIFFERENTIAL
Basophils Absolute: 0 10*3/uL (ref 0.0–0.1)
Basophils Relative: 0 % (ref 0–1)
Eosinophils Absolute: 0.2 10*3/uL (ref 0.0–0.7)
Eosinophils Relative: 1 % (ref 0–5)
Lymphocytes Relative: 9 % — ABNORMAL LOW (ref 12–46)
Monocytes Absolute: 1.4 10*3/uL — ABNORMAL HIGH (ref 0.1–1.0)

## 2011-08-22 LAB — POCT I-STAT, CHEM 8
BUN: 17 mg/dL (ref 6–23)
HCT: 30 % — ABNORMAL LOW (ref 36.0–46.0)
Sodium: 136 mEq/L (ref 135–145)
TCO2: 19 mmol/L (ref 0–100)

## 2011-08-22 LAB — GLUCOSE, CAPILLARY

## 2011-08-22 LAB — POCT I-STAT TROPONIN I: Troponin i, poc: 0 ng/mL (ref 0.00–0.08)

## 2011-08-22 MED ORDER — IOHEXOL 350 MG/ML SOLN
80.0000 mL | Freq: Once | INTRAVENOUS | Status: AC | PRN
  Administered 2011-08-22: 80 mL via INTRAVENOUS

## 2011-08-23 LAB — CBC
MCH: 28.4 pg (ref 26.0–34.0)
MCHC: 33.3 g/dL (ref 30.0–36.0)
MCV: 85.3 fL (ref 78.0–100.0)
Platelets: 710 10*3/uL — ABNORMAL HIGH (ref 150–400)
RDW: 16.3 % — ABNORMAL HIGH (ref 11.5–15.5)

## 2011-08-23 LAB — URINALYSIS, ROUTINE W REFLEX MICROSCOPIC
Nitrite: NEGATIVE
Protein, ur: NEGATIVE mg/dL
Specific Gravity, Urine: 1.046 — ABNORMAL HIGH (ref 1.005–1.030)
Urobilinogen, UA: 0.2 mg/dL (ref 0.0–1.0)

## 2011-08-23 LAB — PREGNANCY, URINE: Preg Test, Ur: NEGATIVE

## 2011-08-23 LAB — CARDIAC PANEL(CRET KIN+CKTOT+MB+TROPI): Total CK: 72 U/L (ref 7–177)

## 2011-08-23 LAB — COMPREHENSIVE METABOLIC PANEL
ALT: 31 U/L (ref 0–35)
AST: 19 U/L (ref 0–37)
Alkaline Phosphatase: 105 U/L (ref 39–117)
CO2: 21 mEq/L (ref 19–32)
Calcium: 8 mg/dL — ABNORMAL LOW (ref 8.4–10.5)
GFR calc Af Amer: >60 mL/min (ref >60)
GFR calc non Af Amer: 52 mL/min — ABNORMAL LOW (ref >60)
Glucose, Bld: 145 mg/dL — ABNORMAL HIGH (ref 70–99)
Potassium: 3 mEq/L — ABNORMAL LOW (ref 3.5–5.1)
Sodium: 133 mEq/L — ABNORMAL LOW (ref 135–145)
Total Protein: 5.9 g/dL — ABNORMAL LOW (ref 6.0–8.3)

## 2011-08-23 LAB — URINE MICROSCOPIC-ADD ON

## 2011-08-23 LAB — DIFFERENTIAL
Basophils Absolute: 0 10*3/uL (ref 0.0–0.1)
Basophils Relative: 0 % (ref 0–1)
Eosinophils Absolute: 0.2 10*3/uL (ref 0.0–0.7)
Monocytes Absolute: 0.9 10*3/uL (ref 0.1–1.0)
Neutro Abs: 15.2 10*3/uL — ABNORMAL HIGH (ref 1.7–7.7)

## 2011-08-23 LAB — RAPID URINE DRUG SCREEN, HOSP PERFORMED
Barbiturates: NOT DETECTED
Benzodiazepines: NOT DETECTED
Cocaine: POSITIVE — AB
Tetrahydrocannabinol: NOT DETECTED

## 2011-08-23 LAB — CK TOTAL AND CKMB (NOT AT ARMC): Total CK: 82 U/L (ref 7–177)

## 2011-08-23 LAB — TROPONIN I: Troponin I: <0.3 ng/mL (ref <0.30)

## 2011-08-23 LAB — TSH: TSH: 2.651 u[IU]/mL (ref 0.350–4.500)

## 2011-08-24 DIAGNOSIS — C50919 Malignant neoplasm of unspecified site of unspecified female breast: Secondary | ICD-10-CM

## 2011-08-24 LAB — CBC
HCT: 27.1 % — ABNORMAL LOW (ref 36.0–46.0)
MCH: 28.5 pg (ref 26.0–34.0)
MCV: 86.9 fL (ref 78.0–100.0)
RDW: 17 % — ABNORMAL HIGH (ref 11.5–15.5)
WBC: 13 10*3/uL — ABNORMAL HIGH (ref 4.0–10.5)

## 2011-08-24 LAB — BASIC METABOLIC PANEL
BUN: 8 mg/dL (ref 6–23)
CO2: 22 mEq/L (ref 19–32)
Chloride: 110 mEq/L (ref 96–112)
Creatinine, Ser: 0.85 mg/dL (ref 0.50–1.10)
GFR calc Af Amer: >60 mL/min (ref >60)

## 2011-08-24 LAB — GIARDIA/CRYPTOSPORIDIUM SCREEN(EIA)
Cryptosporidium Screen (EIA): NEGATIVE
Giardia Screen - EIA: NEGATIVE

## 2011-08-24 NOTE — H&P (Signed)
[name], [name]               ACCOUNT NO.:  [id number]  MEDICAL RECORD NO.:  [id number]  LOCATION:  [facility]                         FACILITY:  [facility]  PHYSICIAN:  Eduard Clos, MDDATE OF BIRTH:  1961-07-06  DATE OF ADMISSION:  08/22/2011 DATE OF DISCHARGE:                             HISTORY & PHYSICAL   PRIMARY CARE PHYSICIAN:  Maurice March, MD, at [facility].  PRIMARY CARDIOLOGIST:  Lyn Records, MD  PRIMARY ONCOLOGIST:  Valentino Hue. Magrinat, MD  PRIMARY SURGEON:  Sharlet Salina T. Hoxworth, MD  CHIEF COMPLAINT:  Nausea and diarrhea with weakness.  HISTORY OF PRESENTING ILLNESS:  A 50 year old female who has just had bilateral mastectomies on August 11, 2011, for breast cancer and had gone home on August 14, 2011, after which around last 2 days the patient has developed multiple episodes of diarrhea with nausea, had at least 1 or 2 episodes of vomiting.  The patient also noticed increasing pain in the site where she had the drain placed.  Denies any fever or chills. Denies any chest pain or shortness of breath.  Denies any loss of consciousness, though the patient felt weak and almost passed out but did not pass out.  Denies any visual symptoms, difficulty speaking or swallowing.  Denies any dysuria, discharges.  Had multiple episodes of diarrhea.  The patient states that in addition she has been recently started on iron pills for her anemia after her blood loss during surgery and has developed some dark black stools.  In the ER, the patient was found to be anemic.  Her hemoglobin is better than recent discharge and her creatinine has increased from normal to 1.5.  At this time, the patient had a CT angio chest which is not showing anything acute.  Negative for PE.  The patient has been admitted for further workup.  The ER physician, Dr. Hyman Hopes, who discussed with me stated that her stool guaiac was negative.  PAST MEDICAL HISTORY: 1. History of CAD, status  post stenting twice with a history of     cocaine abuse.  The last time the patient has stent was on     September 9. 2. History of hypertension. 3. History of hyperlipidemia. 4. History of polysubstance abuse including cocaine which the patient     stated she stopped using for long time now.  PAST SURGICAL HISTORY: 1. Bilateral mastectomy for breast cancer last week on August 11, 2011. 2. Stent placement twice .  MEDICATIONS PRIOR TO ADMISSION: 1. Percocet 5/325 one-two tablets q.6 p.r.n. 2. Klor-Con 20 mg p.o. daily. 3. Toprol-XL 100 mg p.o. daily. 4. Plavix 75 mg p.o. daily. 5. Norvasc 5 mg p.o. daily. 6. Imdur 30 mg p.o. daily. 7. Famotidine 20 mg twice daily. 8. Diovan HCT 320/25 p.o. daily. 9. Aspirin 325 p.o. daily. 10.Lipitor 20 mg p.o. daily. 11.Multivitamins 1 tablet p.o. daily.  ALLERGIES:  No known drug allergies.  FAMILY HISTORY:  Positive for breast cancer in her cousin and her mother had heart attack at age 56s.  Father had heart attack in 55s.  SOCIAL HISTORY:  The patient smokes cigarettes and I strongly advised to quit smoking.  Denies  any alcohol abuse.  Used to use cocaine.  The patient states she has been cocaine free for many months now.  REVIEW OF SYSTEMS:  As present in the history of presenting illness, nothing else significant.  PHYSICAL EXAMINATION:  GENERAL:  The patient examined at bedside, not in acute distress. VITAL SIGNS:  Blood pressure is 115/60, pulse 90 per minute, temperature 98.2, respiration is 20 per minute, O2 sat is 96%. HEENT:  Anicteric.  No pallor.  No discharge from ears, eyes, nose, or mouth. CHEST:  Bilateral air entry present.  Bilateral drains clean with some discolored fluid but not looking purulent. HEART:  S1 and S2 heard. ABDOMEN:  Soft, nontender.  Bowel sounds heard. CNS:  The patient alert, awake, oriented to time, place, and person. Moves upper and lower extremities 5/5. EXTREMITIES:  Peripheral pulses  are felt.  No edema.  LABORATORY DATA:  EKG has been ordered.  CT angio chest shows suboptimal evaluation of distal pulmonary arteries, but no significant central pulmonary embolism was demonstrated.  No focal airspace consolidation or pneumothorax in the lung.  Postoperative changes with bilateral mastectomies.  Surgical drain and postoperative fluid collections present.  CBC, WBCs 20.8, hemoglobin is 10.2, hematocrit is 30, platelets are 789. Basic metabolic panel, sodium 136, potassium 3.4, bicarb was 19, anion gap is 17, glucose 103, BUN 17, creatinine 1.5, troponin 0.  UA is negative for nitrites, leukocytes small, squamous cells few, wbc's 3-6, bacteria few.  Cultures are pending.  ASSESSMENT: 1. Nausea with diarrhea and dehydration. 2. Near syncope, probably from nausea with diarrhea and dehydration     reason. 3. Acute renal failure from dehydration. 4. Possible urinary tract infection. 5. Recent bilateral mastectomies for cancer breast with drains in     place. 6. Anemia. 7. History of coronary artery disease, status post stenting twice. 8. History of hypertension. 9. History of hyperlipidemia. 10.History of cocaine abuse. 11.Ongoing tobacco abuse.  PLAN: 1. At this time, admit the patient to telemetry. 2. For her nausea, dehydration, and acute renal failure, at this time     we will aggressively hydrate the patient.  We will keep the patient     on strict intake and output and daily weight, and the patient is     mildly in the low blood pressures side.  We will hold off her     Diovan HCT and Norvasc for now and also Imdur.  continue Toprol-XL     with holding parameters.  We will check orthostatics in a.m.  We     will check stool for C. diff PCR, ova and parasites, and also     culture and sensitivity.  At this time, we will keep the patient     empirically on Flagyl and Cipro.  The patient does have significant     leukocytosis.  Flagyl empirically I am starting  for possible C.     diff and also Cipro for possible UTI. 3. History of CAD, status post stenting.  Presently, the patient is     chest pain free.  We will cycle cardiac markers and get an EKG.  We     will continue aspirin and Plavix. 4. Anemia which is better than what she was discharged.  We will type     and crossmatch 2 units in whole.  I am going to recheck CBC stat     again in another few hours later to make sure there is no drop in  hemoglobin significantly.  The ER physician told me that her stool     for occult blood was negative.  The patient also was recently     started on iron pills after her surgery for blood loss anemia.  The     patient does have some significant increased platelet count which     has to be closely followed. 5. Bilateral mastectomies recently by Surgery.  At this time, the     drains do show some dusky-colored fluid but not looking purulent.     We need to consult with Surgery in a.m. 6. I am going to repeat a CMET, CBC, procalcitonin level, , lactic     acid, cardiac enzymes, and EKG stat. 7. Further recommendations based on test order and clinical course. 8. At this time, I am holding Diovan, Imdur, and Norvasc which may be     restarted again later on based on her blood pressures.     Eduard Clos, MD     ANK/MEDQ  D:  08/23/2011  T:  08/23/2011  Job:  [id number]  cc:   Maurice March, M.D. Lyn Records, M.D. Lowella Dell, M.D. Lorne Skeens. Hoxworth, M.D.  Electronically Signed by Midge Minium MD on 08/24/2011 09:49:15 AM

## 2011-08-25 LAB — BASIC METABOLIC PANEL
BUN: 5 mg/dL — ABNORMAL LOW (ref 6–23)
CO2: 21 mEq/L (ref 19–32)
Chloride: 109 mEq/L (ref 96–112)
Creatinine, Ser: 0.81 mg/dL (ref 0.50–1.10)
Glucose, Bld: 86 mg/dL (ref 70–99)

## 2011-08-25 LAB — CBC
HCT: 30.1 % — ABNORMAL LOW (ref 36.0–46.0)
Hemoglobin: 9.7 g/dL — ABNORMAL LOW (ref 12.0–15.0)
MCV: 86.2 fL (ref 78.0–100.0)
Platelets: 778 10*3/uL — ABNORMAL HIGH (ref 150–400)
RBC: 3.49 MIL/uL — ABNORMAL LOW (ref 3.87–5.11)
WBC: 10.5 10*3/uL (ref 4.0–10.5)

## 2011-08-26 LAB — CROSSMATCH
Antibody Screen: NEGATIVE
Unit division: 0

## 2011-08-27 ENCOUNTER — Encounter (INDEPENDENT_AMBULATORY_CARE_PROVIDER_SITE_OTHER): Payer: Self-pay | Admitting: General Surgery

## 2011-08-27 ENCOUNTER — Ambulatory Visit (INDEPENDENT_AMBULATORY_CARE_PROVIDER_SITE_OTHER): Payer: PRIVATE HEALTH INSURANCE | Admitting: General Surgery

## 2011-08-27 VITALS — BP 126/82 | HR 84

## 2011-08-27 DIAGNOSIS — Z9889 Other specified postprocedural states: Secondary | ICD-10-CM

## 2011-08-27 LAB — STOOL CULTURE

## 2011-08-27 NOTE — Progress Notes (Signed)
Patient returns now 2 weeks following bilateral mastectomy. She was hospitalized last weekend for nausea vomiting and dehydration. She is now feeling much better. She states she's had some problem with the wound under her arm on the right side.  On examination there is some superficial skin necrosis over a several centimeter area underneath the right axilla. There's been some very superficial skin loss under the left axilla but is clean and about healed. I removed the lateral drain on the left which had minimal drainage. The medial drain still has old bloody drainage. The drain on the right has a fair amount of sediment but the fluid is clear. A half her staples were removed. She will begin showering and change the bandage daily. She is to return in one week to check her drains and wound and probable staple removal.

## 2011-08-27 NOTE — Patient Instructions (Signed)
You may shower and change the bandage afterwards. Call for problems or questions.

## 2011-08-29 LAB — CULTURE, BLOOD (ROUTINE X 2)
Culture  Setup Time: 201209030831
Culture: NO GROWTH

## 2011-09-01 NOTE — Progress Notes (Signed)
Addended by: Judithe Modest D on: 09/01/2011 03:42 PM   Modules accepted: Orders

## 2011-09-02 ENCOUNTER — Ambulatory Visit (INDEPENDENT_AMBULATORY_CARE_PROVIDER_SITE_OTHER): Payer: PRIVATE HEALTH INSURANCE | Admitting: General Surgery

## 2011-09-02 ENCOUNTER — Other Ambulatory Visit (INDEPENDENT_AMBULATORY_CARE_PROVIDER_SITE_OTHER): Payer: Self-pay | Admitting: General Surgery

## 2011-09-02 VITALS — BP 144/88 | HR 70 | Temp 96.4°F

## 2011-09-02 DIAGNOSIS — C50919 Malignant neoplasm of unspecified site of unspecified female breast: Secondary | ICD-10-CM

## 2011-09-02 NOTE — Progress Notes (Signed)
[name] presents to the urgent office plating of a foul-smelling drainage from her left wound. Once she got to the office her right-sided drain fell out. She is status post left modified radical mastectomy for invasive ductal carcinoma and right simple mastectomy with sentinel lymph node biopsy for invasive lobular carcinoma August 10, 2011. She was noted to have some sloughing in the lateral aspect of her right chest wall incision at her last visit.  Exam: She is afebrile.  Chest: Left chest wall incision demonstrates lateral skin breakdown and exposed chest wall. Some staples were present here. A left-sided drain is present and was removed. Right chest wall incision demonstrates superficial lateral breakdown of skin but no exposure of chest wall. There is an open drain site present. The medial aspect of this incision is clean and intact and well healed.  Staples removed in the left lateral incision and the open wound was packed with saline moistened gauze followed by dry dressing. Staples were then removed from the medial aspect of the right chest wall followed by benzoin and Steri-Strips. 2 staples were removed from the medial aspect of the left chest wall incision followed by benzoin and Steri-Strip application.  Assessment: Bilateral skin breakdown of chest wall incisions left greater than right. Left wound is opened laterally and packed.  Plan: Home health to assist with daily wet to dry dressing changes. Return visit with Dr. Johna Sheriff next week.  Encourage smoking cessation.

## 2011-09-02 NOTE — Patient Instructions (Addendum)
We will arrange for home health nurses to come help you with your bandages.  Please stop smoking.

## 2011-09-03 ENCOUNTER — Encounter (INDEPENDENT_AMBULATORY_CARE_PROVIDER_SITE_OTHER): Payer: PRIVATE HEALTH INSURANCE | Admitting: General Surgery

## 2011-09-03 ENCOUNTER — Ambulatory Visit (INDEPENDENT_AMBULATORY_CARE_PROVIDER_SITE_OTHER): Payer: PRIVATE HEALTH INSURANCE

## 2011-09-03 NOTE — Progress Notes (Signed)
Pt here today for redressing and packing of her breast wound.  Her fiancee was with her and I showed him how to pack the wound and cover with a dressing.  He seemed to understand, but she will be back on Monday for a nurse visit since he is working.  The wound looks fine - still some serous drainage, but no signs of infection.

## 2011-09-06 ENCOUNTER — Encounter (INDEPENDENT_AMBULATORY_CARE_PROVIDER_SITE_OTHER): Payer: PRIVATE HEALTH INSURANCE

## 2011-09-07 ENCOUNTER — Ambulatory Visit (INDEPENDENT_AMBULATORY_CARE_PROVIDER_SITE_OTHER): Payer: PRIVATE HEALTH INSURANCE

## 2011-09-07 DIAGNOSIS — IMO0001 Reserved for inherently not codable concepts without codable children: Secondary | ICD-10-CM

## 2011-09-07 DIAGNOSIS — Z4800 Encounter for change or removal of nonsurgical wound dressing: Secondary | ICD-10-CM

## 2011-09-08 ENCOUNTER — Ambulatory Visit (INDEPENDENT_AMBULATORY_CARE_PROVIDER_SITE_OTHER): Payer: PRIVATE HEALTH INSURANCE

## 2011-09-08 VITALS — BP 104/68 | HR 98 | Temp 98.9°F | Resp 18

## 2011-09-08 DIAGNOSIS — Z4800 Encounter for change or removal of nonsurgical wound dressing: Secondary | ICD-10-CM

## 2011-09-08 DIAGNOSIS — IMO0001 Reserved for inherently not codable concepts without codable children: Secondary | ICD-10-CM

## 2011-09-08 NOTE — Progress Notes (Signed)
Redress & repack breast wounds, s/p  Bilateral mast. 08/10/11.  Placed dry gauze on right side incision, repacked left breast wound with wet to dry gauze.  Placed dry gauze over bilateral breast incisions, placed tape and then wrapped kerlix around chest at patients comfort. Both Incisions has some sloughing with moderate amount of serous drainage.  [name] will continue daily dressing changes at our office.

## 2011-09-08 NOTE — Progress Notes (Signed)
Remove gauze & packing, placed dry gauze at the right incision, placed wet to dry gauze in wound on left breast incision.  Left incision had moderate amount of serous drainage.  Placed kerlix around chest at patients request for comfort. Patient will return daily for dressing changes.

## 2011-09-09 ENCOUNTER — Encounter (INDEPENDENT_AMBULATORY_CARE_PROVIDER_SITE_OTHER): Payer: PRIVATE HEALTH INSURANCE

## 2011-09-09 ENCOUNTER — Encounter (HOSPITAL_BASED_OUTPATIENT_CLINIC_OR_DEPARTMENT_OTHER): Payer: Self-pay | Admitting: Oncology

## 2011-09-09 ENCOUNTER — Other Ambulatory Visit: Payer: Self-pay | Admitting: Oncology

## 2011-09-09 DIAGNOSIS — C50419 Malignant neoplasm of upper-outer quadrant of unspecified female breast: Secondary | ICD-10-CM

## 2011-09-09 DIAGNOSIS — C50919 Malignant neoplasm of unspecified site of unspecified female breast: Secondary | ICD-10-CM

## 2011-09-09 LAB — CBC WITH DIFFERENTIAL/PLATELET
BASO%: 0.1 % (ref 0.0–2.0)
Eosinophils Absolute: 0.2 10*3/uL (ref 0.0–0.5)
HCT: 30.9 % — ABNORMAL LOW (ref 34.8–46.6)
LYMPH%: 21.4 % (ref 14.0–49.7)
MCHC: 33.2 g/dL (ref 31.5–36.0)
MONO#: 0.6 10*3/uL (ref 0.1–0.9)
NEUT%: 69.6 % (ref 38.4–76.8)
Platelets: 711 10*3/uL — ABNORMAL HIGH (ref 145–400)
WBC: 9.1 10*3/uL (ref 3.9–10.3)

## 2011-09-10 ENCOUNTER — Encounter (INDEPENDENT_AMBULATORY_CARE_PROVIDER_SITE_OTHER): Payer: PRIVATE HEALTH INSURANCE

## 2011-09-16 ENCOUNTER — Encounter (INDEPENDENT_AMBULATORY_CARE_PROVIDER_SITE_OTHER): Payer: PRIVATE HEALTH INSURANCE

## 2011-09-16 ENCOUNTER — Ambulatory Visit (HOSPITAL_COMMUNITY): Admission: RE | Admit: 2011-09-16 | Payer: Self-pay | Source: Ambulatory Visit

## 2011-09-17 ENCOUNTER — Ambulatory Visit (INDEPENDENT_AMBULATORY_CARE_PROVIDER_SITE_OTHER): Payer: PRIVATE HEALTH INSURANCE | Admitting: General Surgery

## 2011-09-17 VITALS — BP 130/76 | HR 70 | Temp 98.2°F | Resp 16 | Ht 65.0 in | Wt 212.8 lb

## 2011-09-17 DIAGNOSIS — Z9889 Other specified postprocedural states: Secondary | ICD-10-CM

## 2011-09-17 NOTE — Patient Instructions (Signed)
Continue current wound care and call for questions or concerns

## 2011-09-17 NOTE — Progress Notes (Signed)
Patient returns for followup of her bilateral mastectomy. She developed a partial wound dehiscence near the left axilla and has been coming to the office for daily saline dressing changes. She feels it is steadily doing better.  On examination today the right mastectomy site and Port-A-Cath site are well healed. On the left there is an open wound at the lateral end of the mastectomy site now measuring about 4 x 3 cm x 1 cm deep. It is very clean and granulating.  Assessment plan: Status post bilateral mastectomy with partial wound dehiscence on the left which is closing in nicely and no evidence of active infection. I think this would be completely healed in about 3 weeks. I will see her at that time. Continue daily moist saline dressing changes.

## 2011-09-20 ENCOUNTER — Encounter (INDEPENDENT_AMBULATORY_CARE_PROVIDER_SITE_OTHER): Payer: PRIVATE HEALTH INSURANCE

## 2011-09-20 DIAGNOSIS — M79629 Pain in unspecified upper arm: Secondary | ICD-10-CM

## 2011-09-20 HISTORY — DX: Pain in unspecified upper arm: M79.629

## 2011-09-20 LAB — CBC
HCT: 38.3
HCT: 39.6
HCT: 40.5
HCT: 41.6
Hemoglobin: 12.9
Hemoglobin: 13.2
Hemoglobin: 13.3
MCHC: 32.5
MCHC: 32.8
MCV: 86.3
Platelets: 711 — ABNORMAL HIGH
Platelets: 785 — ABNORMAL HIGH
RBC: 4.54
RBC: 4.58
RDW: 16.4 — ABNORMAL HIGH
RDW: 16.7 — ABNORMAL HIGH
RDW: 17 — ABNORMAL HIGH
WBC: 10
WBC: 10.2

## 2011-09-20 LAB — BASIC METABOLIC PANEL
BUN: 6
CO2: 25
Calcium: 8.3 — ABNORMAL LOW
GFR calc non Af Amer: >60
Glucose, Bld: 134 — ABNORMAL HIGH
Potassium: 2.9 — ABNORMAL LOW

## 2011-09-20 LAB — CK TOTAL AND CKMB (NOT AT ARMC)
Total CK: 110
Total CK: 135

## 2011-09-20 LAB — LIPID PANEL
HDL: 43
Triglycerides: 138
VLDL: 28

## 2011-09-20 LAB — POCT CARDIAC MARKERS
Myoglobin, poc: 41
Troponin i, poc: <0.05
Troponin i, poc: <0.05

## 2011-09-20 LAB — POTASSIUM: Potassium: 3.1 — ABNORMAL LOW

## 2011-09-20 LAB — POCT I-STAT, CHEM 8
Calcium, Ion: 1.12
Glucose, Bld: 116 — ABNORMAL HIGH
HCT: 43
Hemoglobin: 14.6

## 2011-09-20 LAB — PROTIME-INR: Prothrombin Time: 13.1

## 2011-09-20 LAB — DIFFERENTIAL
Basophils Absolute: 0.1
Eosinophils Absolute: 0.2
Eosinophils Relative: 2

## 2011-09-20 LAB — PREGNANCY, URINE

## 2011-09-20 LAB — TROPONIN I: Troponin I: <0.01

## 2011-09-20 LAB — HEPARIN LEVEL (UNFRACTIONATED): Heparin Unfractionated: 0.51

## 2011-09-21 ENCOUNTER — Ambulatory Visit (HOSPITAL_COMMUNITY): Payer: Self-pay

## 2011-09-28 LAB — BASIC METABOLIC PANEL
BUN: 8
CO2: 23
CO2: 24
CO2: 25
Calcium: 8.2 — ABNORMAL LOW
Calcium: 8.2 — ABNORMAL LOW
Chloride: 102
Creatinine, Ser: 0.85
GFR calc Af Amer: >60
GFR calc Af Amer: >60
GFR calc non Af Amer: >60
Sodium: 135
Sodium: 136

## 2011-09-28 LAB — HIGH SENSITIVITY CRP: CRP, High Sensitivity: 8.8 — ABNORMAL HIGH

## 2011-09-28 LAB — CBC
HCT: 38.3
Hemoglobin: 12.6
Hemoglobin: 13.6
MCHC: 33.3
MCV: 85
RBC: 4.51
RDW: 15.2
WBC: 10.1

## 2011-09-28 LAB — DIFFERENTIAL
Basophils Absolute: 0
Basophils Relative: 0
Lymphocytes Relative: 32
Monocytes Absolute: 0.8
Neutro Abs: 7
Neutrophils Relative %: 60

## 2011-09-28 LAB — POCT I-STAT 3, ART BLOOD GAS (G3+)
Bicarbonate: 24.8 — ABNORMAL HIGH
Operator id: 282671
pH, Arterial: 7.374
pO2, Arterial: 206 — ABNORMAL HIGH

## 2011-09-28 LAB — LIPID PANEL
Cholesterol: 175
HDL: 42
HDL: 43
LDL Cholesterol: 106 — ABNORMAL HIGH
Total CHOL/HDL Ratio: 3.6
Triglycerides: 130
VLDL: 20

## 2011-09-28 LAB — I-STAT EC8
Bicarbonate: 23.1
Chloride: 100
Hemoglobin: 13.9
Operator id: 282671
Potassium: 2.5 — CL
Sodium: 136
pCO2 arterial: 42.7

## 2011-09-28 LAB — TSH
TSH: 0.69
TSH: 1.32

## 2011-09-28 LAB — APTT: aPTT: 32

## 2011-09-28 LAB — CK TOTAL AND CKMB (NOT AT ARMC)
CK, MB: 5.5 — ABNORMAL HIGH
CK, MB: 60.1 — ABNORMAL HIGH
Total CK: 528 — ABNORMAL HIGH

## 2011-09-28 LAB — B-NATRIURETIC PEPTIDE (CONVERTED LAB): Pro B Natriuretic peptide (BNP): 143 — ABNORMAL HIGH

## 2011-09-28 LAB — MAGNESIUM: Magnesium: 2.3

## 2011-09-29 NOTE — Discharge Summary (Signed)
[name], [name]               ACCOUNT NO.:  [id number]  MEDICAL RECORD NO.:  [id number]  LOCATION:  3001                         FACILITY:  [facility]  PHYSICIAN:  Baltazar Najjar, MD     DATE OF BIRTH:  [birth year]  DATE OF ADMISSION:  08/22/2011 DATE OF DISCHARGE:                              DISCHARGE SUMMARY   FINAL DISCHARGE DIAGNOSES: 1. Acute gastroenteritis, resolved. 2. Leukocytosis, resolved. 3. Dehydration, resolved. 4. Acute kidney injury secondary to prerenal azotemia, resolved. 5. Hyponatremia secondary to dehydration and diuretics use, resolved. 6. Hypokalemia probably secondary to diuretics, resolved. 7. Acute-on-chronic urinalysis with negative fecal occult blood,     stable. 8. Hypertension. 9. Recent bilateral mastectomy on August 10, 2011. 10.Smoking/cocaine use.  CONSULTATIONS DURING THIS HOSPITALIZATION: 1. [facility]. 2. Oncology.  The patient was seen by Dr. Darnelle Catalan.  RADIOLOGY/IMAGING STUDIES:  CT angiogram of the chest done on August 22, 2011 showed no significant central pulmonary embolus.  No focal airspace consolidation or pneumothorax in the lungs.  Postoperative changes with bilateral mastectomy.  Surgical drains and postoperative fluid collections present.  BRIEF ADMITTING HISTORY:  Please refer to H and P for more details on summary.  [name] is a 50 year old African American woman with recent bilateral mastectomies for breast cancer, presented to the ER on August 23, 2011, with chief complaint of nausea, diarrhea, and weakness.  Please refer to H and P for more details.  HOSPITAL COURSE:  The patient was admitted to the diagnosis of dehydration and gastroenteritis.  She was also found to have acute kidney injury and probable urinary tract infection. 1. Acute gastroenteritis.  Stool for Clostridium difficile was     negative.  The patient was hydrated with IV fluids and her symptom     is currently resolved.   She was initially covered empirically with     Flagyl, that was discontinued. 2. Probable urinary tract infection.  The patient has no symptoms to     suggest urinary tract infection.  Her urine culture showed     polymicrobial organisms.  She had leukocytosis on presentation;     however, with no fever.  The patient was given one dose of     ciprofloxacin on presentation that was subsequently discontinued     and the patient remained afebrile.  Her leukocytosis has resolved. 3. Recent bilateral mastectomy for breast cancer, status post     bilateral drains.  The patient was seen by [facility]     during this hospitalization.  There was no acute concerns noted.     Wound Care consult was consulted during this hospitalization and     there was no concerns found.  The patient did have bilateral drains     and she will follow with [facility] on Friday.  She     does have pains at the drain sites on and off.  The drains have     draining mildly bloody secretions. 4. Leukocytosis without absence of fever, no CD4 is identify.  Her CT     of chest did not show any evidence of pneumonia.  Her urinalysis on  presentation showed a small leukocyte with negative nitrates and 3-     6 wbc's.  The patient has no symptoms of dysuria.  Her urine     culture grew polymicrobial organisms, which is probably     contaminant.  Ciprofloxacin was discontinued after 1 day and the     patient remained afebrile and her white blood count had normalized. 5. Acute-on-chronic anemia with negative fecal occult blood on     presentation as commented in HPI.  Her hemoglobin is currently     stable.  The patient to follow with her PCP and oncologist on     discharge. 6. Bilateral breast cancer.  The patient to follow with Dr. Darnelle Catalan     as scheduled on September 09, 2011. 7. Acute renal failure secondary to dehydration, resolved with IV     fluid.  Her diuretics and ARB were held on  presentation. 8. Dehydration, resolved. 9. Transient hyponatremia and hypokalemia on presentation, resolved     that is probably secondary to hydrochlorothiazide dehydration.  I     will eliminate the hydrochlorothiazide from her antihypertensive     regimen. 10.Hypertension, not optimally controlled.  Hydrochlorothiazide will     be eliminated secondary to dehydration, hypokalemia and     hyponatremia.  We will keep her on Benicar, Norvasc and metoprolol.     The patient to follow with her PCP for any further adjustment of     her antihypertensive regimen. 11.Smoking/cocaine use.  The patient was counseled.  Social worker to     see prior to discharge to provide outpatient resources.  DISCHARGE MEDICATIONS: 1. Benicar 40 mg p.o. daily. 2. Zofran 4 mg p.o. q.6 h. p.r.n. 3. Aspirin 325 mg p.o. daily. 4. Famotidine 20 mg p.o. b.i.d. 5. Imdur ER one tablet p.o. daily 30 mg. 6. Lipitor 20 mg p.o. daily. 7. Multivitamin one tablet p.o. daily. 8. Norvasc 5 mg one tablet p.o. daily. 9. Percocet 5/325 mg one to two tablets p.o. q.6 h. p.r.n. 10.Plavix 75 mg p.o. daily. 11.Toprol-XL 100 mg one tablet p.o. daily.  DISCHARGE INSTRUCTIONS: 1. The patient to continue above medications as prescribed. 2. The patient to follow with [facility] Oncology as arranged and also to     follow with her PCP within 1 week of discharge. 3. The patient to report any worsening of symptoms or any new symptoms     to her PCP or come to the ER if needed.  CONDITION ON DISCHARGE:  Stable/improved.          ______________________________ Baltazar Najjar, MD     SA/MEDQ  D:  08/25/2011  T:  08/25/2011  Job:  [id number]  cc:   Maurice March, M.D. Lowella Dell, M.D. Lorne Skeens. Hoxworth, M.D.  Electronically Signed by Hannah Beat MD on 09/29/2011 07:58:03 PM

## 2011-09-29 NOTE — Discharge Summary (Signed)
  [name], [name]               ACCOUNT NO.:  [id number]  MEDICAL RECORD NO.:  [id number]  LOCATION:  3001                         FACILITY:  [facility]  PHYSICIAN:  Baltazar Najjar, MD     DATE OF BIRTH:  28-Jul-1961  DATE OF ADMISSION:  08/22/2011 DATE OF DISCHARGE:  08/25/2011                              DISCHARGE SUMMARY   ADDENDUM:  PENDING LAB RESULTS:  The patient had blood cultures sent that showed no growth so far.  However, those results are preliminary.  Final report still pending at the time of dictation.  Those  culture results to be followed by her PCP or her consultant.  At the time of discharge, the patient is stable.  There was no concern for any sepsis at this time, however, those cultures need to be followed.          ______________________________ Baltazar Najjar, MD     SA/MEDQ  D:  08/25/2011  T:  08/25/2011  Job:  [id number]  cc:   Maurice March, M.D. Lowella Dell, M.D. Lorne Skeens. Hoxworth, M.D.  Electronically Signed by Hannah Beat MD on 09/29/2011 07:58:29 PM

## 2011-09-30 ENCOUNTER — Other Ambulatory Visit (HOSPITAL_COMMUNITY): Payer: Self-pay

## 2011-09-30 LAB — TYPE AND SCREEN
ABO/RH(D): B POS
Antibody Screen: NEGATIVE

## 2011-09-30 LAB — CBC
HCT: 39
Hemoglobin: 13.2
MCHC: 32.8
MCHC: 32.8
MCHC: 32.9
MCHC: 32.9
MCHC: 33.3
MCV: 84.7
MCV: 85.8
MCV: 87
MCV: 87.1
Platelets: 663 — ABNORMAL HIGH
Platelets: 677 — ABNORMAL HIGH
Platelets: 690 — ABNORMAL HIGH
Platelets: 696 — ABNORMAL HIGH
Platelets: 764 — ABNORMAL HIGH
RBC: 4.32
RBC: 4.48
RBC: 4.55
RDW: 15.2 — ABNORMAL HIGH
RDW: 15.4 — ABNORMAL HIGH
WBC: 10.9 — ABNORMAL HIGH
WBC: 8.6
WBC: 8.8

## 2011-09-30 LAB — CLOSTRIDIUM DIFFICILE EIA: C difficile Toxins A+B, EIA: NEGATIVE

## 2011-09-30 LAB — I-STAT 8, (EC8 V) (CONVERTED LAB)
Bicarbonate: 33.6 — ABNORMAL HIGH
Glucose, Bld: 105 — ABNORMAL HIGH
Hemoglobin: 15
Sodium: 139
TCO2: 34
pCO2, Ven: 25.6 — ABNORMAL LOW

## 2011-09-30 LAB — POCT CARDIAC MARKERS
CKMB, poc: 3.2
Myoglobin, poc: 127
Myoglobin, poc: 258
Operator id: 151321
Operator id: 151321
Troponin i, poc: <0.05
Troponin i, poc: <0.05

## 2011-09-30 LAB — DIFFERENTIAL
Basophils Absolute: 0
Basophils Relative: 0
Basophils Relative: 1
Eosinophils Absolute: 0.2
Lymphocytes Relative: 27
Lymphs Abs: 2.9
Monocytes Absolute: 0.6
Monocytes Absolute: 0.7
Monocytes Relative: 7
Neutro Abs: 4.9
Neutro Abs: 6.5
Neutrophils Relative %: 57
Neutrophils Relative %: 64

## 2011-09-30 LAB — URINE MICROSCOPIC-ADD ON

## 2011-09-30 LAB — STOOL CULTURE

## 2011-09-30 LAB — ABO/RH: ABO/RH(D): B POS

## 2011-09-30 LAB — CK TOTAL AND CKMB (NOT AT ARMC)
CK, MB: 4.9 — ABNORMAL HIGH
Relative Index: 1.3
Total CK: 373 — ABNORMAL HIGH

## 2011-09-30 LAB — CARDIAC PANEL(CRET KIN+CKTOT+MB+TROPI)
CK, MB: 2.8
Relative Index: 1
Total CK: 283 — ABNORMAL HIGH
Total CK: 301 — ABNORMAL HIGH
Troponin I: 0.02
Troponin I: <0.01

## 2011-09-30 LAB — BASIC METABOLIC PANEL
BUN: 2 — ABNORMAL LOW
BUN: 6
CO2: 22
Chloride: 108
Chloride: 112
Creatinine, Ser: 0.78
Creatinine, Ser: 0.81
GFR calc Af Amer: >60
GFR calc non Af Amer: >60

## 2011-09-30 LAB — COMPREHENSIVE METABOLIC PANEL
Albumin: 3 — ABNORMAL LOW
BUN: 10
Calcium: 8.1 — ABNORMAL LOW
Creatinine, Ser: 0.95
Glucose, Bld: 102 — ABNORMAL HIGH
Total Protein: 5.6 — ABNORMAL LOW

## 2011-09-30 LAB — URINALYSIS, ROUTINE W REFLEX MICROSCOPIC
Hgb urine dipstick: NEGATIVE
Leukocytes, UA: NEGATIVE
Protein, ur: 30 — AB
Specific Gravity, Urine: 1.017
Urobilinogen, UA: 0.2

## 2011-09-30 LAB — RAPID URINE DRUG SCREEN, HOSP PERFORMED
Amphetamines: NOT DETECTED
Benzodiazepines: POSITIVE — AB
Tetrahydrocannabinol: NOT DETECTED

## 2011-09-30 LAB — POTASSIUM: Potassium: 2.6 — CL

## 2011-09-30 LAB — TROPONIN I: Troponin I: 0.02

## 2011-09-30 LAB — SALICYLATE LEVEL: Salicylate Lvl: <4

## 2011-09-30 LAB — MAGNESIUM: Magnesium: 2.2

## 2011-09-30 LAB — POCT I-STAT CREATININE: Operator id: 277751

## 2011-10-01 ENCOUNTER — Encounter (INDEPENDENT_AMBULATORY_CARE_PROVIDER_SITE_OTHER): Payer: Self-pay | Admitting: General Surgery

## 2011-10-01 ENCOUNTER — Ambulatory Visit (INDEPENDENT_AMBULATORY_CARE_PROVIDER_SITE_OTHER): Payer: PRIVATE HEALTH INSURANCE | Admitting: General Surgery

## 2011-10-01 VITALS — BP 128/78 | HR 72 | Temp 97.6°F | Resp 16 | Ht 65.0 in | Wt 208.8 lb

## 2011-10-01 DIAGNOSIS — Z9889 Other specified postprocedural states: Secondary | ICD-10-CM

## 2011-10-01 NOTE — Progress Notes (Signed)
Patient returns for more long-term followup status post bilateral mastectomy with some wound breakdown in the left axilla. She's been getting daily dressing changes here in the office. She reports no problems.  On examination the wound continues to close in. There is about a 1-1/2 cm area of granulation in the axilla and a tiny area of granulation the mid wound. The right side is completely healed.  Assessment and plan: Doing well with continued wound healing I suspect this will be completely healed within the next couple weeks. She is almost quit cigarettes. She has a followup in the near future with medical oncology.

## 2011-10-05 ENCOUNTER — Ambulatory Visit (HOSPITAL_COMMUNITY)
Admission: RE | Admit: 2011-10-05 | Discharge: 2011-10-05 | Disposition: A | Discharge status: Home / Self Care | Payer: Self-pay | Source: Ambulatory Visit | Attending: Oncology | Admitting: Oncology

## 2011-10-05 DIAGNOSIS — I10 Essential (primary) hypertension: Secondary | ICD-10-CM | POA: Insufficient documentation

## 2011-10-05 DIAGNOSIS — Z9010 Acquired absence of unspecified breast and nipple: Secondary | ICD-10-CM | POA: Insufficient documentation

## 2011-10-05 DIAGNOSIS — Z01818 Encounter for other preprocedural examination: Secondary | ICD-10-CM | POA: Insufficient documentation

## 2011-10-11 ENCOUNTER — Encounter (HOSPITAL_BASED_OUTPATIENT_CLINIC_OR_DEPARTMENT_OTHER): Payer: Self-pay | Admitting: Oncology

## 2011-10-11 DIAGNOSIS — C50919 Malignant neoplasm of unspecified site of unspecified female breast: Secondary | ICD-10-CM

## 2011-10-11 DIAGNOSIS — Z9010 Acquired absence of unspecified breast and nipple: Secondary | ICD-10-CM

## 2011-10-11 DIAGNOSIS — Z170 Estrogen receptor positive status [ER+]: Secondary | ICD-10-CM

## 2011-10-14 ENCOUNTER — Ambulatory Visit (INDEPENDENT_AMBULATORY_CARE_PROVIDER_SITE_OTHER): Payer: PRIVATE HEALTH INSURANCE | Admitting: General Surgery

## 2011-10-14 ENCOUNTER — Encounter (INDEPENDENT_AMBULATORY_CARE_PROVIDER_SITE_OTHER): Payer: Self-pay | Admitting: General Surgery

## 2011-10-14 VITALS — BP 128/90 | HR 68 | Temp 97.2°F | Resp 20 | Ht 65.0 in | Wt 212.4 lb

## 2011-10-14 DIAGNOSIS — Z09 Encounter for follow-up examination after completed treatment for conditions other than malignant neoplasm: Secondary | ICD-10-CM

## 2011-10-14 NOTE — Patient Instructions (Signed)
Wash wounds in shower and cover with a dry bandage or Band-Aid

## 2011-10-14 NOTE — Progress Notes (Signed)
Patient returns for a wound check post bilateral mastectomy with postop wound infection and open areas toward each axilla. She states they're still occasionally painful.  On examination both wounds are nearly completely healed with just 2 small areas less than 1 cm of clean granulation toward each axilla.  I think these wounds will be completely healed by early November when she starts her chemotherapy. I refilled her Percocet #40. She is to return in one month.

## 2011-10-21 ENCOUNTER — Encounter (INDEPENDENT_AMBULATORY_CARE_PROVIDER_SITE_OTHER): Payer: PRIVATE HEALTH INSURANCE | Admitting: General Surgery

## 2011-10-22 ENCOUNTER — Encounter (INDEPENDENT_AMBULATORY_CARE_PROVIDER_SITE_OTHER): Payer: PRIVATE HEALTH INSURANCE | Admitting: General Surgery

## 2011-10-25 ENCOUNTER — Telehealth (INDEPENDENT_AMBULATORY_CARE_PROVIDER_SITE_OTHER): Payer: Self-pay | Admitting: General Surgery

## 2011-10-25 ENCOUNTER — Other Ambulatory Visit: Payer: Self-pay | Admitting: *Deleted

## 2011-10-25 MED ORDER — PROCHLORPERAZINE MALEATE 10 MG PO TABS: 10.0000 mg | ORAL_TABLET | ORAL | Status: DC

## 2011-10-25 MED ORDER — LIDOCAINE-PRILOCAINE 2.5-2.5 % EX CREA: TOPICAL_CREAM | CUTANEOUS | Status: AC | PRN

## 2011-10-25 MED ORDER — DEXAMETHASONE 4 MG PO TABS: 4.0000 mg | ORAL_TABLET | ORAL | Status: AC

## 2011-10-25 MED ORDER — LIDOCAINE-PRILOCAINE 2.5-2.5 % EX CREA: TOPICAL_CREAM | CUTANEOUS | Status: DC | PRN

## 2011-10-25 MED ORDER — DEXAMETHASONE 4 MG PO TABS: 4.0000 mg | ORAL_TABLET | ORAL | Status: DC

## 2011-10-25 MED ORDER — ONDANSETRON HCL 8 MG PO TABS: 8.0000 mg | ORAL_TABLET | Freq: Three times a day (TID) | ORAL | Status: DC | PRN

## 2011-10-25 MED ORDER — ONDANSETRON HCL 8 MG PO TABS: 8.0000 mg | ORAL_TABLET | Freq: Three times a day (TID) | ORAL | Status: AC | PRN

## 2011-10-25 NOTE — Telephone Encounter (Signed)
[facility] CALLED TO REQUEST REFILL OF PERCOCET 5/325MG . I CONTACTED DR. HOXWORTH IN OPERATING ROOM AND HE APPROVED 1 REFILL ONLY FOR PERCOCET 5/325MG . I WILL HAVE URGENT OFFICE DR. TODAY WRITE RX. PT AWARE OF PICK-UP THIS PM.

## 2011-10-26 NOTE — Progress Notes (Signed)
CSW provided emergency assistance information. Pt plans to contact [employer] and follow up with CSW.

## 2011-10-28 ENCOUNTER — Encounter: Payer: Self-pay | Admitting: *Deleted

## 2011-10-28 NOTE — Progress Notes (Signed)
CSW met with pt in office today to discuss emergency assistance. The pt has been meeting with CSW to apply for financial assistance resources. Pt states she is still waiting to determine if she is eligible for SSDI and her partner Elita Quick is working, but unable to pay all bills. Pt's biggest concerns at this time is paying her rent and medications. CSW gave pt information on [employer] and [employer]. [employer] is not taking applicants at this time, however, [employer] may have openings next week.Pt states she is somewhat anxious regarding her finances but relies heavily on her faith. CSW contacted [employer] and pt's landlord on pt's behalf to coordinate possible assistance. [facility] called [facility] to determine how much pt's medications would cost: altogether [facility]. CSW contacted [facility] and determined pt can get medications for $67.55. CSW called pt and informed of reduced prices at [facility]. Pt plans to go to [facility] today to get scripts filled.Pt will follow with CSW for additional needs.

## 2011-11-01 ENCOUNTER — Ambulatory Visit: Payer: Self-pay

## 2011-11-01 ENCOUNTER — Ambulatory Visit (HOSPITAL_BASED_OUTPATIENT_CLINIC_OR_DEPARTMENT_OTHER): Payer: Self-pay | Admitting: Lab

## 2011-11-01 ENCOUNTER — Ambulatory Visit (HOSPITAL_BASED_OUTPATIENT_CLINIC_OR_DEPARTMENT_OTHER): Payer: Self-pay | Admitting: Physician Assistant

## 2011-11-01 ENCOUNTER — Telehealth: Payer: Self-pay | Admitting: *Deleted

## 2011-11-01 ENCOUNTER — Encounter: Payer: Self-pay | Admitting: Physician Assistant

## 2011-11-01 ENCOUNTER — Other Ambulatory Visit: Payer: Self-pay | Admitting: Oncology

## 2011-11-01 VITALS — BP 143/95 | HR 74 | Temp 97.5°F | Ht 65.0 in | Wt 220.6 lb

## 2011-11-01 DIAGNOSIS — C801 Malignant (primary) neoplasm, unspecified: Secondary | ICD-10-CM

## 2011-11-01 DIAGNOSIS — C50419 Malignant neoplasm of upper-outer quadrant of unspecified female breast: Secondary | ICD-10-CM

## 2011-11-01 DIAGNOSIS — C50919 Malignant neoplasm of unspecified site of unspecified female breast: Secondary | ICD-10-CM

## 2011-11-01 LAB — COMPREHENSIVE METABOLIC PANEL
ALT: 36 U/L — ABNORMAL HIGH (ref 0–35)
Albumin: 3.6 g/dL (ref 3.5–5.2)
CO2: 24 mEq/L (ref 19–32)
Calcium: 9.6 mg/dL (ref 8.4–10.5)
Chloride: 100 mEq/L (ref 96–112)
Creatinine, Ser: 0.81 mg/dL (ref 0.50–1.10)
Potassium: 3.8 mEq/L (ref 3.5–5.3)
Total Protein: 7.3 g/dL (ref 6.0–8.3)

## 2011-11-01 LAB — CBC WITH DIFFERENTIAL/PLATELET
Eosinophils Absolute: 0.1 10*3/uL (ref 0.0–0.5)
HCT: 38.7 % (ref 34.8–46.6)
LYMPH%: 18.8 % (ref 14.0–49.7)
MONO#: 0.5 10*3/uL (ref 0.1–0.9)
NEUT#: 7.7 10*3/uL — ABNORMAL HIGH (ref 1.5–6.5)
Platelets: 591 10*3/uL — ABNORMAL HIGH (ref 145–400)
RBC: 5.04 10*6/uL (ref 3.70–5.45)
WBC: 10.4 10*3/uL — ABNORMAL HIGH (ref 3.9–10.3)
lymph#: 2 10*3/uL (ref 0.9–3.3)
nRBC: 0 % (ref 0–0)

## 2011-11-01 MED ORDER — OXYCODONE-ACETAMINOPHEN 5-325 MG PO TABS: 1.0000 | ORAL_TABLET | Freq: Four times a day (QID) | ORAL | Status: AC | PRN

## 2011-11-01 NOTE — Telephone Encounter (Signed)
Gave patient appointments for 10-2011 thru 11-2011. Printed out calendar and gave to the patient in main lobby

## 2011-11-01 NOTE — Progress Notes (Signed)
Hematology and Oncology Follow Up Visit  [name] [id number] [birth year] 50 y.o. 11/01/2011 4:08 PM   Interim History:   The patient returns today for followup of her bilateral breast carcinomas, due to initiate adjuvant chemotherapy which will consist of docetaxel, carboplatin, and trastuzumab. The patient was actually scheduled to initiate chemotherapy today, but has had difficulty getting her anti-emetics filled due to financial reasons. She has met with our Child psychotherapist, and they have worked out a plan to help the patient in her medications. She hopes to have them by the middle of the week.  The patient is a little anxious about chemotherapy today, and we spent more than half of her 45 minute appointment today, once again going over possible side effects and reviewing her antinausea regimen. She has prescriptions for her dexamethasone, prochlorperazine, and ondansetron, in addition to EMLA cream. We reviewed the use of all of these medications with both the patient and her fianc who accompanies her here today.  Physically, the patient continues to have postsurgical pain in the chest wall, more so on the right today than the left. She is healing well, with a clean dry dressing intact. She has been following up regularly with Dr.Hoxworth who feels she is ready to proceed with chemotherapy. She does request a refill on her pain medication today, oxycodone with APAP, 5/325 mg. A detailed review of systems is otherwise noncontributory as noted below.  Review of Systems: Constitutional:  no weight loss, fever, night sweats Eyes: negative  WUJ:[id number] Cardiovascular: no chest pain or dyspnea on exertion Respiratory: no cough, shortness of breath, or wheezing Neurological: negative Dermatological: negative Gastrointestinal: no abdominal pain, change in bowel habits, or black or bloody stools Genito-Urinary: no dysuria, trouble voiding, or hematuria Hematological and Lymphatic:  negative Breast: positive for - recent meastectomy Musculoskeletal: positive for - pain in chest wall Remaining ROS negative.  Medications: I have reviewed the patient's current medications.  Allergies: No Known Allergies   Physical Exam:  Blood pressure 143/95, pulse 74, temperature 97.5 F (36.4 C), temperature source Oral, height 5\' 5"  (1.651 m), weight 220 lb 9.6 oz (100.064 kg). HEENT:  Sclerae anicteric, conjunctivae pink.  Oropharynx clear.  No mucositis or candidiasis.  Nodes:  No cervical, supraclavicular, or axillary lymphadenopathy palpated.  Breast Exam:  Patient is status post bilateral mastectomies, clean dry dressing is intact, with a nicely healing incisions. No redness, drainage, or evidence of infection. Lungs:  Clear to auscultation bilaterally.  No crackles, rhonchi, or wheezes.  Heart:  Regular rate and rhythm.  Abdomen:  Soft, nontender, obese.  Positive bowel sounds.  No organomegaly or masses palpated.  Musculoskeletal:  No focal spinal tenderness to palpation.  Extremities:  Benign.  No peripheral edema or cyanosis.  Skin:  Benign.  Neuro:  Nonfocal.   Lab Results: Lab Results  Component Value Date   WBC 10.5 08/25/2011   HGB 12.5 11/01/2011   HCT 38.7 11/01/2011   MCV 76.8* 11/01/2011   PLT 591* 11/01/2011   NEUTROABS 15.2* 08/23/2011     Chemistry      Component Value Date/Time   NA 134* 11/01/2011 0923   K 3.8 11/01/2011 0923   CL 100 11/01/2011 0923   CO2 24 11/01/2011 0923   BUN 10 11/01/2011 0923   CREATININE 0.81 11/01/2011 0923      Component Value Date/Time   CALCIUM 9.6 11/01/2011 0923   ALKPHOS 110 11/01/2011 0923   AST 35 11/01/2011 0923   ALT 36* 11/01/2011 2956  BILITOT 0.3 11/01/2011 1610         Impression and Plan: 50 year old [place] woman, status post bilateral mastectomies on 08/11/2011. 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 ER +90%, PR +30%, HER-2/neu positive with a ratio  of 2.53, MIB-1 of 60%. On the right, there were multiple foci of invasive lobular carcinoma. Patient is due to initiate adjuvant chemotherapy consisting of docetaxel, carboplatin, and trastuzumab, to be given every 3 weeks for a total of 6 cycles, with Neulasta given on day 2 for granulocyte support.  This case was reviewed with Dr. Darnelle Catalan. We will plan on initiating chemotherapy next Monday, November 19, with Neulasta given on day 2, November 20. I again reviewed Sunni's antinausea medications with her, and she and her husband were given written information on how to utilize all of these medications appropriately. They both voice their understanding and agreement with this plan, and the patient is ready to proceed to treatment next week as scheduled. I did refill her oxycodone and APAP, 5/325 mg, 1 by mouth every 6 hours as needed for pain. She was given 30 tablets, no refills.  Spent more than half the time coordinating care.    Janiya Millirons, PA-C 11/12/20124:08 PM

## 2011-11-02 ENCOUNTER — Ambulatory Visit: Payer: Self-pay

## 2011-11-04 ENCOUNTER — Other Ambulatory Visit: Payer: Self-pay | Admitting: Oncology

## 2011-11-05 ENCOUNTER — Encounter (INDEPENDENT_AMBULATORY_CARE_PROVIDER_SITE_OTHER): Payer: PRIVATE HEALTH INSURANCE | Admitting: General Surgery

## 2011-11-08 ENCOUNTER — Other Ambulatory Visit: Payer: Self-pay | Admitting: Lab

## 2011-11-08 ENCOUNTER — Other Ambulatory Visit: Payer: Self-pay | Admitting: Oncology

## 2011-11-08 ENCOUNTER — Telehealth: Payer: Self-pay | Admitting: *Deleted

## 2011-11-08 ENCOUNTER — Ambulatory Visit: Payer: Self-pay

## 2011-11-09 ENCOUNTER — Ambulatory Visit: Payer: Self-pay

## 2011-11-09 ENCOUNTER — Ambulatory Visit: Payer: Self-pay | Admitting: Oncology

## 2011-11-10 ENCOUNTER — Telehealth: Payer: Self-pay | Admitting: *Deleted

## 2011-11-15 ENCOUNTER — Telehealth: Payer: Self-pay | Admitting: *Deleted

## 2011-11-15 ENCOUNTER — Other Ambulatory Visit: Payer: Self-pay | Admitting: Oncology

## 2011-11-15 ENCOUNTER — Other Ambulatory Visit (HOSPITAL_BASED_OUTPATIENT_CLINIC_OR_DEPARTMENT_OTHER): Payer: Self-pay | Admitting: Lab

## 2011-11-15 ENCOUNTER — Other Ambulatory Visit: Payer: Self-pay | Admitting: Lab

## 2011-11-15 ENCOUNTER — Other Ambulatory Visit: Payer: Self-pay | Admitting: Physician Assistant

## 2011-11-15 ENCOUNTER — Ambulatory Visit (HOSPITAL_BASED_OUTPATIENT_CLINIC_OR_DEPARTMENT_OTHER): Payer: Self-pay

## 2011-11-15 ENCOUNTER — Ambulatory Visit: Payer: Self-pay

## 2011-11-15 ENCOUNTER — Encounter: Payer: Self-pay | Admitting: *Deleted

## 2011-11-15 ENCOUNTER — Ambulatory Visit: Payer: Self-pay | Admitting: Physician Assistant

## 2011-11-15 VITALS — BP 140/69 | HR 83 | Temp 98.4°F

## 2011-11-15 DIAGNOSIS — C50919 Malignant neoplasm of unspecified site of unspecified female breast: Secondary | ICD-10-CM

## 2011-11-15 DIAGNOSIS — C50419 Malignant neoplasm of upper-outer quadrant of unspecified female breast: Secondary | ICD-10-CM

## 2011-11-15 DIAGNOSIS — Z5111 Encounter for antineoplastic chemotherapy: Secondary | ICD-10-CM

## 2011-11-15 DIAGNOSIS — Z5112 Encounter for antineoplastic immunotherapy: Secondary | ICD-10-CM

## 2011-11-15 LAB — CBC WITH DIFFERENTIAL/PLATELET
BASO%: 0.1 % (ref 0.0–2.0)
EOS%: 0 % (ref 0.0–7.0)
HCT: 41.8 % (ref 34.8–46.6)
MCH: 25.6 pg (ref 25.1–34.0)
MCHC: 32.2 g/dL (ref 31.5–36.0)
MONO#: 0.6 10*3/uL (ref 0.1–0.9)
RBC: 5.27 10*6/uL (ref 3.70–5.45)
RDW: 19 % — ABNORMAL HIGH (ref 11.2–14.5)
WBC: 25.2 10*3/uL — ABNORMAL HIGH (ref 3.9–10.3)
lymph#: 1.7 10*3/uL (ref 0.9–3.3)

## 2011-11-15 MED ORDER — DEXTROSE 5 % IV SOLN
75.0000 mg/m2 | Freq: Once | INTRAVENOUS | Status: AC
  Administered 2011-11-15: 160 mg via INTRAVENOUS
  Filled 2011-11-15: qty 16

## 2011-11-15 MED ORDER — SODIUM CHLORIDE 0.9 % IV SOLN: Freq: Once | INTRAVENOUS | Status: DC

## 2011-11-15 MED ORDER — DEXAMETHASONE SODIUM PHOSPHATE 4 MG/ML IJ SOLN
20.0000 mg | Freq: Once | INTRAMUSCULAR | Status: AC
  Administered 2011-11-15: 20 mg via INTRAVENOUS

## 2011-11-15 MED ORDER — HEPARIN SOD (PORK) LOCK FLUSH 100 UNIT/ML IV SOLN
500.0000 [IU] | Freq: Once | INTRAVENOUS | Status: AC | PRN
  Administered 2011-11-15: 500 [IU]
  Filled 2011-11-15: qty 5

## 2011-11-15 MED ORDER — ONDANSETRON 16 MG/50ML IVPB (CHCC)
16.0000 mg | Freq: Once | INTRAVENOUS | Status: AC
  Administered 2011-11-15: 16 mg via INTRAVENOUS

## 2011-11-15 MED ORDER — ACETAMINOPHEN 325 MG PO TABS
650.0000 mg | ORAL_TABLET | Freq: Once | ORAL | Status: AC
  Administered 2011-11-15: 650 mg via ORAL

## 2011-11-15 MED ORDER — SODIUM CHLORIDE 0.9 % IV SOLN
750.0000 mg | Freq: Once | INTRAVENOUS | Status: AC
  Administered 2011-11-15: 750 mg via INTRAVENOUS
  Filled 2011-11-15: qty 75

## 2011-11-15 MED ORDER — DIPHENHYDRAMINE HCL 25 MG PO CAPS
50.0000 mg | ORAL_CAPSULE | Freq: Once | ORAL | Status: AC
  Administered 2011-11-15: 50 mg via ORAL

## 2011-11-15 MED ORDER — SODIUM CHLORIDE 0.9 % IJ SOLN
10.0000 mL | INTRAMUSCULAR | Status: DC | PRN
  Administered 2011-11-15: 10 mL
  Filled 2011-11-15: qty 10

## 2011-11-15 MED ORDER — TRASTUZUMAB CHEMO INJECTION 440 MG
6.0000 mg/kg | Freq: Once | INTRAVENOUS | Status: AC
  Administered 2011-11-15: 609 mg via INTRAVENOUS
  Filled 2011-11-15: qty 29

## 2011-11-15 NOTE — Telephone Encounter (Signed)
gave patient appointment for 11-2011 cancelled patient appointment for 11-29-2011 lab only that day gave patient appointment for 12-27-2011

## 2011-11-15 NOTE — Patient Instructions (Signed)
1921-Pt discharged ambulatory with next appointment confirmed.  Pt aware to call with any questions or concerns.

## 2011-11-15 NOTE — Progress Notes (Signed)
[name] [id number] 1961-11-02 11/15/2011  This patient came into the clinic today for labs and her first dose of chemotherapy.  She was not seen for an actual office visit.  Her orders and appointments have been adjusted following this delay in treatment.

## 2011-11-15 NOTE — Progress Notes (Signed)
Informed by Rona Ravens, RN that pt did not have prescriptions for anti-nausea medications d/t inability to pay for them.  Pt has been declared 100% indigent.  Call in anti-nausea meds to [facility].  Gave pt directions to [facility] and instructions for anti-nausea medications, as well as contact information.

## 2011-11-16 ENCOUNTER — Telehealth: Payer: Self-pay | Admitting: Oncology

## 2011-11-16 ENCOUNTER — Telehealth: Payer: Self-pay | Admitting: *Deleted

## 2011-11-16 ENCOUNTER — Ambulatory Visit (HOSPITAL_BASED_OUTPATIENT_CLINIC_OR_DEPARTMENT_OTHER): Payer: Self-pay

## 2011-11-16 VITALS — BP 177/104 | HR 86 | Temp 97.9°F

## 2011-11-16 DIAGNOSIS — C50419 Malignant neoplasm of upper-outer quadrant of unspecified female breast: Secondary | ICD-10-CM

## 2011-11-16 DIAGNOSIS — C50919 Malignant neoplasm of unspecified site of unspecified female breast: Secondary | ICD-10-CM

## 2011-11-16 DIAGNOSIS — Z5189 Encounter for other specified aftercare: Secondary | ICD-10-CM

## 2011-11-16 MED ORDER — PEGFILGRASTIM INJECTION 6 MG/0.6ML
6.0000 mg | Freq: Once | SUBCUTANEOUS | Status: AC
  Administered 2011-11-16: 6 mg via SUBCUTANEOUS
  Filled 2011-11-16: qty 0.6

## 2011-11-16 NOTE — Telephone Encounter (Signed)
Made a Note °

## 2011-11-16 NOTE — Telephone Encounter (Signed)
PT. WAS SEEN IN THE INJECTION ROOM BY JANICE BISHOP,LPN. NO PROBLEMS OR QUESTIONS AT THIS TIME. PT. WILL CALL THIS OFFICE IF THE NEED ARISES.

## 2011-11-17 ENCOUNTER — Telehealth (INDEPENDENT_AMBULATORY_CARE_PROVIDER_SITE_OTHER): Payer: Self-pay

## 2011-11-17 NOTE — Telephone Encounter (Signed)
Pt called to request pain med refill be sent to [facility] [address] if she cannot have percocet. Pt stating she wanted percocet.  Noted is system also [facility] as requesting pharmacies.. As I was reviewing request I noted pt has received oxycodone #30 11-15-11 by Dr Darnelle Catalan. Pt began chemo yesterday. Pt advised she needs to call his office for pain medication request since pharmacy stated pt should have 8 day supply as written and we cannot have multiple MDs prescribing narcotic meds. Pt states she will f/u with Dr Darnelle Catalan for pain meds.

## 2011-11-17 NOTE — Telephone Encounter (Signed)
Patient called while I was out of office during Thanksgiving Holiday, returning her call patient with new appointment date & time from [place]11 with Dr. Johna Sheriff.

## 2011-11-18 ENCOUNTER — Other Ambulatory Visit: Payer: Self-pay | Admitting: *Deleted

## 2011-11-18 ENCOUNTER — Telehealth: Payer: Self-pay | Admitting: Oncology

## 2011-11-18 ENCOUNTER — Other Ambulatory Visit: Payer: Self-pay | Admitting: Physician Assistant

## 2011-11-18 MED ORDER — OXYCODONE-ACETAMINOPHEN 5-325 MG PO TABS: 1.0000 | ORAL_TABLET | ORAL | Status: DC | PRN

## 2011-11-18 NOTE — Telephone Encounter (Signed)
called pt and informed her to p/u new appt for [id number]

## 2011-11-22 ENCOUNTER — Ambulatory Visit: Payer: Self-pay | Admitting: Physician Assistant

## 2011-11-22 ENCOUNTER — Telehealth: Payer: Self-pay | Admitting: *Deleted

## 2011-11-22 ENCOUNTER — Other Ambulatory Visit: Payer: Self-pay | Admitting: Lab

## 2011-11-22 ENCOUNTER — Other Ambulatory Visit: Payer: Self-pay

## 2011-11-22 NOTE — Telephone Encounter (Signed)
patient called in and changed requested to move her appointment to 11-23-2011 starting at 1:15pm

## 2011-11-23 ENCOUNTER — Ambulatory Visit: Payer: Self-pay | Admitting: Physician Assistant

## 2011-11-23 ENCOUNTER — Telehealth: Payer: Self-pay | Admitting: *Deleted

## 2011-11-23 ENCOUNTER — Other Ambulatory Visit: Payer: Self-pay | Admitting: Lab

## 2011-11-23 NOTE — Telephone Encounter (Signed)
patient called in not feeling good she will be in on 11-24-2011 at 1:15pm

## 2011-11-24 ENCOUNTER — Encounter: Payer: Self-pay | Admitting: Physician Assistant

## 2011-11-24 ENCOUNTER — Other Ambulatory Visit (HOSPITAL_BASED_OUTPATIENT_CLINIC_OR_DEPARTMENT_OTHER): Payer: Self-pay | Admitting: Lab

## 2011-11-24 ENCOUNTER — Ambulatory Visit (HOSPITAL_BASED_OUTPATIENT_CLINIC_OR_DEPARTMENT_OTHER): Payer: Self-pay | Admitting: Physician Assistant

## 2011-11-24 VITALS — BP 133/82 | HR 87 | Temp 98.0°F | Ht 65.0 in | Wt 217.2 lb

## 2011-11-24 DIAGNOSIS — C50919 Malignant neoplasm of unspecified site of unspecified female breast: Secondary | ICD-10-CM

## 2011-11-24 DIAGNOSIS — M949 Disorder of cartilage, unspecified: Secondary | ICD-10-CM

## 2011-11-24 DIAGNOSIS — C50419 Malignant neoplasm of upper-outer quadrant of unspecified female breast: Secondary | ICD-10-CM

## 2011-11-24 DIAGNOSIS — Z170 Estrogen receptor positive status [ER+]: Secondary | ICD-10-CM

## 2011-11-24 LAB — CBC WITH DIFFERENTIAL/PLATELET
BASO%: 0.2 % (ref 0.0–2.0)
Eosinophils Absolute: 0 10*3/uL (ref 0.0–0.5)
LYMPH%: 7.5 % — ABNORMAL LOW (ref 14.0–49.7)
MCHC: 32.3 g/dL (ref 31.5–36.0)
MONO#: 0.5 10*3/uL (ref 0.1–0.9)
NEUT#: 31.2 10*3/uL — ABNORMAL HIGH (ref 1.5–6.5)
Platelets: 333 10*3/uL (ref 145–400)
RBC: 4.61 10*6/uL (ref 3.70–5.45)
RDW: 19.3 % — ABNORMAL HIGH (ref 11.2–14.5)
WBC: 34.3 10*3/uL — ABNORMAL HIGH (ref 3.9–10.3)
lymph#: 2.6 10*3/uL (ref 0.9–3.3)

## 2011-11-24 MED ORDER — OXYCODONE-ACETAMINOPHEN 5-325 MG PO TABS: 1.0000 | ORAL_TABLET | Freq: Three times a day (TID) | ORAL | Status: AC | PRN

## 2011-11-24 NOTE — Progress Notes (Signed)
Hematology and Oncology Follow Up Visit  [name] [id number] April 11, 1961 50 y.o. 11/24/2011    HPI: At age of 27, [name] was referred by Dr. Johna Sheriff For treatment of left breast carcinoma.  The patient palpated a mass in her left breast and was scheduled by Dr. Audria Nine for mammogram at the breast Center. Mammogram was obtained on 06/18/2011, and was compared with prior mammogram in January of 2009. There was a new ill defined density in the superior portion of the left breast which was palpable. There were multiple pleomorphic microcalcifications extending throughout the upper outer quadrant worrisome for diffuse DCIS. Several markedly enlarged lymph nodes were also palpable. By exam, the breast mass was approximately 5 cm in size. Left breast ultrasound measured the mass at 3.4 cm, irregular and hypoechoic with adjacent satellite nodules. A second mass in the left breast measured 1.4 cm. There were several enlarged axillary lymph nodes on the left, the largest measuring 5.4 cm.  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  Breast MRI on 07/05/2011 showed patchy nodular enhancement in the left breast, measuring up to 7.5 cm, with a second area of patchy nodular enhancement measuring 2.6 cm in the same breast, a third measuring 1.1 cm. Multiple enlarged left axillary lymph nodes, the largest measuring 4.4 cm by MRI. There was also an area in the right breast which had been biopsied on 07/01/2011 and showed only lobular carcinoma in situ. The patient also had a needle core biopsy of a lymph node ([id number]) on 07/01/2011, confirming metastatic carcinoma.  The patient subsequently underwent bilateral mastectomies under the care of Dr. Johna Sheriff on 08/10/2011.  Pathology [phone]) confirmed invasive ductal carcinoma with calcifications, grade 3,  spanning 5.7 cm in the left breast. 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 There was evidence of lymphovascular invasion. 2 of 18 lymph nodes were involved on the left. Tumor was again ER +100%, PR +4%. HER-2/neu amplification was detected by CIS H, with a ratio of 2.53. Pathology of the right breast was positive for multiple foci of invasive lobular carcinoma, spanning 1.7 and 0.7 cm with lobular carcinoma in situ as well. Again there was lymphovascular invasion identified. 0 of one lymph node involved on the right side.  Adjuvant chemotherapy was delayed slightly due to to slow healing of the mastectomy incision. Once healing was complete, adjuvant chemotherapy was started on 11/15/2011, consisting of docetaxel, carboplatin, and trastuzumab. The plan will be to complete 6 q. three-week cycles of this regimen, continuing the trastuzumab for total of one year. The patient will also need postmastectomy radiation, in addition to anti-estrogen therapy.  Interim History:   Patient is currently day 10 cycle 1 of 6 planned every 3 week doses of docetaxel, carboplatin, and trastuzumab. She received Neulasta on day 2 for granulocyte support. Labs show evidence of leukocytosis today secondary to the Neulasta, and patient reports significant bony aches and pains over the last week.  She has also developed a "cold". She's had no fevers, chills, or night sweats. She has had some nasal congestion, postnasal drip, mild sore throat, and a cough productive of yellow phlegm. No hemoptysis. No pleurisy. No increased shortness of breath.  Patient denies any signs of nausea or emesis. She's had a couple episodes of diarrhea which has improved. She's had some tingling in her fingertips intermittently, nothing that is affecting her day-to-day activities. No signs of  neuropathy in the lower extremities. No peripheral swelling. No mouth ulcers or oral sensitivity. No excessive tearing. No skin changes, no bed changes, or nail bed  sensitivity.  A detailed review of systems is otherwise noncontributory as noted below.  Review of Systems: Constitutional:  Fatigue, no weight loss, fever, night sweats Eyes: negative OZH:YQMVH congestion, nasal discharge and sore throat Cardiovascular: no chest pain or dyspnea on exertion Respiratory: positive for - cough negative for - hemoptysis, pleuritic pain or shortness of breath Neurological: negative Dermatological: negative Gastrointestinal: no abdominal pain, change in bowel habits, or black or bloody stools Genito-Urinary: no dysuria, trouble voiding, or hematuria Hematological and Lymphatic: negative Breast: negative Musculoskeletal: positive for - joint pain and bone pain diffusely Remaining ROS negative.  Medications: I have reviewed the patient's current medications. Current Outpatient Prescriptions  Medication Sig Dispense Refill  . amLODipine (NORVASC) 5 MG tablet Take 5 mg by mouth daily.        Marland Kitchen aspirin 325 MG tablet Take 325 mg by mouth daily.        Marland Kitchen atorvastatin (LIPITOR) 20 MG tablet Take 20 mg by mouth daily.        . clopidogrel (PLAVIX) 75 MG tablet Take 75 mg by mouth daily.        Marland Kitchen dexamethasone (DECADRON) 4 MG tablet Take 4 mg by mouth 2 (two) times daily with a meal.        . famotidine (PEPCID) 20 MG tablet Take 20 mg by mouth 2 (two) times daily.        . folic acid (FOLVITE) 1 MG tablet Take 1 mg by mouth as needed.       . isosorbide dinitrate (ISORDIL) 30 MG tablet Take 30 mg by mouth 4 (four) times daily.        Marland Kitchen lidocaine-prilocaine (EMLA) cream Apply topically as needed.  30 g  0  . metoprolol (TOPROL-XL) 100 MG 24 hr tablet Take 100 mg by mouth daily.        . Multiple Vitamins-Minerals (MULTIVITAMIN WITH MINERALS) tablet Take 1 tablet by mouth daily.        . nitroGLYCERIN (NITROSTAT) 0.4 MG SL tablet Place 1 tablet (0.4 mg total) under the tongue every 5 (five) minutes as needed for chest pain.  25 tablet  8  . ondansetron (ZOFRAN) 8  MG tablet Take by mouth every 12 (twelve) hours as needed.        Marland Kitchen oxyCODONE-acetaminophen (PERCOCET) 5-325 MG per tablet Take 1 tablet by mouth every 8 (eight) hours as needed for pain.  30 tablet  0  . POTASSIUM CHLORIDE CR PO Take by mouth daily.        . prochlorperazine (COMPAZINE) 10 MG tablet Take 10 mg by mouth every 6 (six) hours as needed.        . valsartan (DIOVAN) 320 MG tablet Take 320 mg by mouth daily.          Allergies: No Known Allergies   Physical Exam: Filed Vitals:   11/24/11 1412  BP: 133/82  Pulse: 87  Temp: 98 F (36.7 C)   HEENT:  Sclerae anicteric, conjunctivae pink.  Oropharynx clear.  No mucositis or candidiasis.  Nodes:  No cervical, supraclavicular, or axillary lymphadenopathy palpated.  Breast Exam:  Deferred.  Lungs:  Clear to auscultation bilaterally.  No crackles, rhonchi, or wheezes.  Heart:  Regular rate and rhythm.  Abdomen:  Soft, obese, nontender.  Positive bowel sounds.  No organomegaly or masses palpated.  Musculoskeletal:  No focal spinal tenderness to palpation.  Extremities:  Benign.  No peripheral edema or cyanosis.  Skin:  Benign.  Neuro:  Nonfocal.   Lab Results: Lab Results  Component Value Date   WBC 34.3* 11/24/2011   HGB 11.7 11/24/2011   HCT 36.1 11/24/2011   MCV 78.3* 11/24/2011   PLT 333 11/24/2011   NEUTROABS 31.2* 11/24/2011     Chemistry      Component Value Date/Time   NA 134* 11/01/2011 0923   K 3.8 11/01/2011 0923   CL 100 11/01/2011 0923   CO2 24 11/01/2011 0923   BUN 10 11/01/2011 0923   CREATININE 0.81 11/01/2011 0923      Component Value Date/Time   CALCIUM 9.6 11/01/2011 0923   ALKPHOS 110 11/01/2011 0923   AST 35 11/01/2011 0923   ALT 36* 11/01/2011 0923   BILITOT 0.3 11/01/2011 0923            Radiological Studies:  No results found.   Impression and Plan: 50 year old [place] woman, status post bilateral mastectomies on 08/11/2011. On the left, a 5.7 cm, grade 3, invasive ductal carcinoma  with 2 of 18 nodes positive. ER +90%, PR +30%, HER-2/neu positive with a ratio of 2.53, MIB-1 of 60%. On the right, there were multiple foci of invasive lobular carcinoma. Patient is receiving adjuvant chemotherapy consisting of docetaxel, carboplatin, and trastuzumab, to be given every 3 weeks for a total of 6 cycles, with Neulasta given on day 2 for granulocyte support. Currently day 10, cycle 1 of 6 planned cycles. Also with bone pain and leukocytosis secondary to Neulasta injection.  Overall patient tolerated treatment extremely well. She did have bony pain associated with Neulasta injection which seems to be her biggest complaint today other than an apparent viral upper respiratory infection. She has requested a refill today on her oxycodone/APAP, and I have refilled this was 30 additional tablets, no refills.  I have asked the patient to contact us with any fevers, increased cough and/or phlegm production, or worsening symptoms of the upper respiratory infection. At this point appears to be viral. If symptoms are prolonged or worsened, we will consider an antibiotic, perhaps a Z-Pak.  The patient will return next week for labs alone on December 10, and we'll see Dr. Darnelle Catalan on December 17 anticipation of her second dose of adjuvant chemotherapy.   This plan was reviewed with the patient, who voices understanding and agreement.  She knows to call with any changes or problems.    Kimyetta Flott, PA-C 11/24/2011

## 2011-11-25 ENCOUNTER — Telehealth (INDEPENDENT_AMBULATORY_CARE_PROVIDER_SITE_OTHER): Payer: Self-pay

## 2011-11-25 ENCOUNTER — Encounter (INDEPENDENT_AMBULATORY_CARE_PROVIDER_SITE_OTHER): Payer: PRIVATE HEALTH INSURANCE | Admitting: General Surgery

## 2011-11-25 NOTE — Telephone Encounter (Signed)
[name] called to reschedule her appointment for 11/25/11, patient has a cold/flu.  [name] has been reschedule to 12/09/11 with Dr. Johna Sheriff.

## 2011-11-29 ENCOUNTER — Other Ambulatory Visit (HOSPITAL_BASED_OUTPATIENT_CLINIC_OR_DEPARTMENT_OTHER): Payer: Self-pay | Admitting: Lab

## 2011-11-29 ENCOUNTER — Ambulatory Visit: Payer: Self-pay

## 2011-11-29 ENCOUNTER — Ambulatory Visit: Payer: Self-pay | Admitting: Oncology

## 2011-11-29 DIAGNOSIS — C50919 Malignant neoplasm of unspecified site of unspecified female breast: Secondary | ICD-10-CM

## 2011-11-29 LAB — COMPREHENSIVE METABOLIC PANEL
ALT: 47 U/L — ABNORMAL HIGH (ref 0–35)
CO2: 25 mEq/L (ref 19–32)
Calcium: 9.5 mg/dL (ref 8.4–10.5)
Chloride: 104 mEq/L (ref 96–112)
Creatinine, Ser: 0.98 mg/dL (ref 0.50–1.10)
Glucose, Bld: 94 mg/dL (ref 70–99)
Total Bilirubin: 0.1 mg/dL — ABNORMAL LOW (ref 0.3–1.2)
Total Protein: 7.2 g/dL (ref 6.0–8.3)

## 2011-11-29 LAB — CBC WITH DIFFERENTIAL/PLATELET
BASO%: 0.3 % (ref 0.0–2.0)
Eosinophils Absolute: 0 10*3/uL (ref 0.0–0.5)
HCT: 36.7 % (ref 34.8–46.6)
HGB: 11.8 g/dL (ref 11.6–15.9)
LYMPH%: 13 % — ABNORMAL LOW (ref 14.0–49.7)
MONO#: 0.4 10*3/uL (ref 0.1–0.9)
NEUT#: 11.5 10*3/uL — ABNORMAL HIGH (ref 1.5–6.5)
NEUT%: 83.4 % — ABNORMAL HIGH (ref 38.4–76.8)
Platelets: 388 10*3/uL (ref 145–400)
WBC: 13.7 10*3/uL — ABNORMAL HIGH (ref 3.9–10.3)
lymph#: 1.8 10*3/uL (ref 0.9–3.3)

## 2011-11-30 ENCOUNTER — Ambulatory Visit: Payer: Self-pay

## 2011-12-06 ENCOUNTER — Ambulatory Visit (HOSPITAL_BASED_OUTPATIENT_CLINIC_OR_DEPARTMENT_OTHER): Payer: Self-pay

## 2011-12-06 ENCOUNTER — Other Ambulatory Visit (HOSPITAL_BASED_OUTPATIENT_CLINIC_OR_DEPARTMENT_OTHER): Payer: Self-pay | Admitting: Lab

## 2011-12-06 ENCOUNTER — Ambulatory Visit (HOSPITAL_BASED_OUTPATIENT_CLINIC_OR_DEPARTMENT_OTHER): Payer: Self-pay | Admitting: Oncology

## 2011-12-06 ENCOUNTER — Ambulatory Visit: Payer: Self-pay | Admitting: Physician Assistant

## 2011-12-06 VITALS — BP 150/97 | HR 88 | Temp 97.5°F | Ht 65.0 in | Wt 224.6 lb

## 2011-12-06 DIAGNOSIS — C50919 Malignant neoplasm of unspecified site of unspecified female breast: Secondary | ICD-10-CM

## 2011-12-06 DIAGNOSIS — R52 Pain, unspecified: Secondary | ICD-10-CM

## 2011-12-06 DIAGNOSIS — C50419 Malignant neoplasm of upper-outer quadrant of unspecified female breast: Secondary | ICD-10-CM

## 2011-12-06 DIAGNOSIS — Z5112 Encounter for antineoplastic immunotherapy: Secondary | ICD-10-CM

## 2011-12-06 DIAGNOSIS — Z170 Estrogen receptor positive status [ER+]: Secondary | ICD-10-CM

## 2011-12-06 DIAGNOSIS — Z5111 Encounter for antineoplastic chemotherapy: Secondary | ICD-10-CM

## 2011-12-06 LAB — COMPREHENSIVE METABOLIC PANEL
ALT: 30 U/L (ref 0–35)
AST: 20 U/L (ref 0–37)
Albumin: 3.7 g/dL (ref 3.5–5.2)
CO2: 27 mEq/L (ref 19–32)
Calcium: 9.9 mg/dL (ref 8.4–10.5)
Chloride: 104 mEq/L (ref 96–112)
Potassium: 3.1 mEq/L — ABNORMAL LOW (ref 3.5–5.3)
Total Protein: 7.5 g/dL (ref 6.0–8.3)

## 2011-12-06 LAB — CBC WITH DIFFERENTIAL/PLATELET
BASO%: 0.1 % (ref 0.0–2.0)
Eosinophils Absolute: 0 10*3/uL (ref 0.0–0.5)
HCT: 36.3 % (ref 34.8–46.6)
LYMPH%: 5.5 % — ABNORMAL LOW (ref 14.0–49.7)
MCHC: 33.1 g/dL (ref 31.5–36.0)
MONO#: 0.7 10*3/uL (ref 0.1–0.9)
NEUT#: 22 10*3/uL — ABNORMAL HIGH (ref 1.5–6.5)
Platelets: 680 10*3/uL — ABNORMAL HIGH (ref 145–400)
RBC: 4.83 10*6/uL (ref 3.70–5.45)
WBC: 24 10*3/uL — ABNORMAL HIGH (ref 3.9–10.3)
lymph#: 1.3 10*3/uL (ref 0.9–3.3)
nRBC: 0 % (ref 0–0)

## 2011-12-06 MED ORDER — DOCETAXEL CHEMO INJECTION 160 MG/16ML
75.0000 mg/m2 | Freq: Once | INTRAVENOUS | Status: AC
  Administered 2011-12-06: 160 mg via INTRAVENOUS
  Filled 2011-12-06: qty 16

## 2011-12-06 MED ORDER — ONDANSETRON 16 MG/50ML IVPB (CHCC)
16.0000 mg | Freq: Once | INTRAVENOUS | Status: AC
  Administered 2011-12-06: 16 mg via INTRAVENOUS

## 2011-12-06 MED ORDER — DIPHENHYDRAMINE HCL 25 MG PO CAPS
25.0000 mg | ORAL_CAPSULE | Freq: Once | ORAL | Status: AC
  Administered 2011-12-06: 25 mg via ORAL

## 2011-12-06 MED ORDER — ACETAMINOPHEN 325 MG PO TABS
650.0000 mg | ORAL_TABLET | Freq: Once | ORAL | Status: AC
  Administered 2011-12-06: 650 mg via ORAL

## 2011-12-06 MED ORDER — SODIUM CHLORIDE 0.9 % IV SOLN
750.0000 mg | Freq: Once | INTRAVENOUS | Status: AC
  Administered 2011-12-06: 750 mg via INTRAVENOUS
  Filled 2011-12-06: qty 75

## 2011-12-06 MED ORDER — HEPARIN SOD (PORK) LOCK FLUSH 100 UNIT/ML IV SOLN
500.0000 [IU] | Freq: Once | INTRAVENOUS | Status: AC | PRN
  Administered 2011-12-06: 500 [IU]
  Filled 2011-12-06: qty 5

## 2011-12-06 MED ORDER — DEXAMETHASONE SODIUM PHOSPHATE 4 MG/ML IJ SOLN
20.0000 mg | Freq: Once | INTRAMUSCULAR | Status: AC
  Administered 2011-12-06: 20 mg via INTRAVENOUS

## 2011-12-06 MED ORDER — TRASTUZUMAB CHEMO INJECTION 440 MG
6.0000 mg/kg | Freq: Once | INTRAVENOUS | Status: AC
  Administered 2011-12-06: 609 mg via INTRAVENOUS
  Filled 2011-12-06: qty 29

## 2011-12-06 MED ORDER — SODIUM CHLORIDE 0.9 % IJ SOLN
10.0000 mL | INTRAMUSCULAR | Status: DC | PRN
  Administered 2011-12-06: 10 mL
  Filled 2011-12-06: qty 10

## 2011-12-06 MED ORDER — SODIUM CHLORIDE 0.9 % IV SOLN
Freq: Once | INTRAVENOUS | Status: AC
  Administered 2011-12-06: 12:00:00 via INTRAVENOUS

## 2011-12-06 NOTE — Progress Notes (Signed)
Hematology and Oncology Follow Up Visit  [name] [id number] [birth year] 50 y.o. 12/06/2011    HPI: At age of 41, Kierah Goatley was referred by Dr. Leonard Schwartz. [place] for treatment of left breast carcinoma.  The patient palpated a mass in her left breast and was scheduled by Dr. Audria Nine for mammogram at the breast Center. Mammogram was obtained on 06/18/2011, and was compared with prior mammogram in January of 2009. There was a new ill defined density in the superior portion of the left breast which was palpable. There were multiple pleomorphic microcalcifications extending throughout the upper outer quadrant worrisome for diffuse DCIS. Several markedly enlarged lymph nodes were also palpable. By exam, the breast mass was approximately 5 cm in size. Left breast ultrasound measured the mass at 3.4 cm, irregular and hypoechoic with adjacent satellite nodules. A second mass in the left breast measured 1.4 cm. There were several enlarged axillary lymph nodes on the left, the largest measuring 5.4 cm.  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  Breast MRI on 07/05/2011 showed patchy nodular enhancement in the left breast, measuring up to 7.5 cm, with a second area of patchy nodular enhancement measuring 2.6 cm in the same breast, a third measuring 1.1 cm. Multiple enlarged left axillary lymph nodes, the largest measuring 4.4 cm by MRI. There was also an area in the right breast which had been biopsied on 07/01/2011 and showed only lobular carcinoma in situ. The patient also had a needle core biopsy of a lymph node ([id number]) on 07/01/2011, confirming metastatic carcinoma.  The patient subsequently underwent bilateral mastectomies under the care of Dr. Johna Sheriff on 08/10/2011.  Pathology [phone]) confirmed invasive ductal carcinoma with calcifications, grade  3, spanning 5.7 cm in the left breast. 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 There was evidence of lymphovascular invasion. 2 of 18 lymph nodes were involved on the left. Tumor was again ER +100%, PR +4%. HER-2/neu amplification was detected by CIS H, with a ratio of 2.53. Pathology of the right breast was positive for multiple foci of invasive lobular carcinoma, spanning 1.7 and 0.7 cm with lobular carcinoma in situ as well. Again there was lymphovascular invasion identified. 0 of one lymph node involved on the right side.  Adjuvant chemotherapy was delayed slightly due to to slow healing of the mastectomy incision. Once healing was complete, adjuvant chemotherapy was started on 11/15/2011, consisting of docetaxel, carboplatin, and trastuzumab.  Interim History:   The patient did generally well after her first cycle of chemotherapy. She was able to get all her antinausea medications paid for with some help from the cancer Center. She did vomit on day 4 after chemotherapy. She had stopped the dexamethasone by that point. She lost her hair and is wearing a very becoming wig. She is here today with her fianc, [place].   Review of Systems: Overall she is doing well with the chemotherapy. She does get achy after the Neulasta and requested Percocets to be written again today. This is discussed below. She is sleeping poorly; she describes herself as mildly fatigued. She is a little bit hoarse.She gets short of breath when walking upstairs or up a slope. Her stools are slightly darker in color but not black or tarry; she's noted a little bit of hyperpigmentation in her hands and nails. She feels a little bit depressed. She requested a living will which she thought  was a legal will. This was discussed in detail today. Otherwise the review of systems was noncontributory.  Medications: I have reviewed the patient's current medications. Current Outpatient Prescriptions  Medication Sig Dispense Refill  . amLODipine (NORVASC) 5  MG tablet Take 5 mg by mouth daily.        Marland Kitchen aspirin 325 MG tablet Take 325 mg by mouth daily.        Marland Kitchen atorvastatin (LIPITOR) 20 MG tablet Take 20 mg by mouth daily.        . clopidogrel (PLAVIX) 75 MG tablet Take 75 mg by mouth daily.        Marland Kitchen dexamethasone (DECADRON) 4 MG tablet Take 4 mg by mouth 2 (two) times daily with a meal.        . famotidine (PEPCID) 20 MG tablet Take 20 mg by mouth 2 (two) times daily.        . isosorbide dinitrate (ISORDIL) 30 MG tablet Take 30 mg by mouth 4 (four) times daily.        Marland Kitchen lidocaine-prilocaine (EMLA) cream Apply topically as needed.  30 g  0  . metoprolol (TOPROL-XL) 100 MG 24 hr tablet Take 100 mg by mouth daily.        . Multiple Vitamins-Minerals (MULTIVITAMIN WITH MINERALS) tablet Take 1 tablet by mouth daily.        . nitroGLYCERIN (NITROSTAT) 0.4 MG SL tablet Place 1 tablet (0.4 mg total) under the tongue every 5 (five) minutes as needed for chest pain.  25 tablet  8  . ondansetron (ZOFRAN) 8 MG tablet Take by mouth every 12 (twelve) hours as needed.        Marland Kitchen POTASSIUM CHLORIDE CR PO Take by mouth daily.        . prochlorperazine (COMPAZINE) 10 MG tablet Take 10 mg by mouth every 6 (six) hours as needed.        . valsartan (DIOVAN) 320 MG tablet Take 320 mg by mouth daily.        . folic acid (FOLVITE) 1 MG tablet Take 1 mg by mouth as needed.         Allergies: No Known Allergies   Physical Exam: Filed Vitals:   12/06/11 1046  BP: 150/97  Pulse: 88  Temp: 97.5 F (36.4 C)    HEENT:  Sclerae anicteric, conjunctivae pink.  Oropharynx clear.  No mucositis or candidiasis. Nodes:  No cervical, supraclavicular, or axillary lymphadenopathy palpated.   Breast Exam:  Status post bilateral mastectomies. No evidence of local recurrence.  Lungs:  Clear to auscultation bilaterally.  No crackles, rhonchi, or wheezes.   Heart:  Regular rate and rhythm.   Abdomen:  Soft, obese, nontender.  Positive bowel sounds.  No organomegaly or masses palpated.    Musculoskeletal:  No focal spinal tenderness to palpation.  Extremities:  Benign.  No peripheral edema or cyanosis.   Skin:  A boil in the left lower back is resolving, with no evidence of active infection at present.   Neuro:  Nonfocal.   Lab Results: Lab Results  Component Value Date   WBC 24.0* 12/06/2011   HGB 12.0 12/06/2011   HCT 36.3 12/06/2011   MCV 75.2* 12/06/2011   PLT 680* 12/06/2011   NEUTROABS 22.0* 12/06/2011     Chemistry      Component Value Date/Time   NA 139 11/29/2011 1024   K 3.1* 11/29/2011 1024   CL 104 11/29/2011 1024   CO2 25 11/29/2011 1024  BUN 5* 11/29/2011 1024   CREATININE 0.98 11/29/2011 1024      Component Value Date/Time   CALCIUM 9.5 11/29/2011 1024   ALKPHOS 121* 11/29/2011 1024   AST 28 11/29/2011 1024   ALT 47* 11/29/2011 1024   BILITOT 0.1* 11/29/2011 1024      Radiological Studies:  No new results found.   Impression: 50 year old [place] woman, status post bilateral mastectomies on 08/11/2011 for  (1) On the left, a 5.7 cm, grade 3, invasive ductal carcinoma with 2 of 18 nodes positive, and so T3N1 or Stage III; ER +90%, PR +30%, HER-2/neu positive with a ratio of 2.53, MIB-1 of 60%.   (2) On the right, there were multiple foci of invasive lobular carcinoma.   (3) Patient is receiving adjuvant chemotherapy consisting of docetaxel, carboplatin, and trastuzumab, to be given every 3 weeks for a total of 6 cycles, with Neulasta given on day 2 for granulocyte support, and trastuzumab to be continued to total one year..   Plan: Today is day 1 cycle 2 of her 6 planned cycles of chemotherapy with trastuzumab. I making no changes in the treatment. She will return to see [name] on December 31 for symptomatic followup, and then she will receive cycle #3 on January 7.  The patient gets aches and pains from the Neulasta. I wrote her for Percocet #40 to take up to 3 times a day as needed. Will continue to provide her with supportive care  as needed to facilitate her completing her treatments with node dose reductions or delays. She knows to call for any problems that may develop before the next visit.     Alizabeth Antonio C, PA-C 12/06/2011

## 2011-12-06 NOTE — Patient Instructions (Signed)
1520 Pt ambulatory upon discharge and verbalized understanding of next appt date/time and to call if any problems.

## 2011-12-07 ENCOUNTER — Ambulatory Visit (HOSPITAL_BASED_OUTPATIENT_CLINIC_OR_DEPARTMENT_OTHER): Payer: Self-pay

## 2011-12-07 VITALS — BP 159/99 | HR 79 | Temp 98.1°F

## 2011-12-07 DIAGNOSIS — C50919 Malignant neoplasm of unspecified site of unspecified female breast: Secondary | ICD-10-CM

## 2011-12-07 DIAGNOSIS — C50419 Malignant neoplasm of upper-outer quadrant of unspecified female breast: Secondary | ICD-10-CM

## 2011-12-07 MED ORDER — PEGFILGRASTIM INJECTION 6 MG/0.6ML
6.0000 mg | Freq: Once | SUBCUTANEOUS | Status: AC
  Administered 2011-12-07: 6 mg via SUBCUTANEOUS
  Filled 2011-12-07: qty 0.6

## 2011-12-09 ENCOUNTER — Encounter (INDEPENDENT_AMBULATORY_CARE_PROVIDER_SITE_OTHER): Payer: Self-pay | Admitting: General Surgery

## 2011-12-09 ENCOUNTER — Ambulatory Visit (INDEPENDENT_AMBULATORY_CARE_PROVIDER_SITE_OTHER): Payer: PRIVATE HEALTH INSURANCE | Admitting: General Surgery

## 2011-12-09 DIAGNOSIS — C50919 Malignant neoplasm of unspecified site of unspecified female breast: Secondary | ICD-10-CM

## 2011-12-09 NOTE — Progress Notes (Signed)
History: Patient returns for more long-term followup status post bilateral mastectomy with axillary dissection on the right for stage III cancer and total mastectomy on the left for multiple foci of invasive lobular carcinoma. She has been through 2 cycles of chemotherapy under the direction of Dr. Darnelle Catalan. She is tolerating this well. She seems upbeat and optimistic. She still has some pain at her mastectomy sites but this is gradually improving.  Exam: Both mastectomy wounds are well healed. Port site looks fine. She has some slight limitation of full extension of both arms. I encouraged her to continue stretching exercises and we gave her specific instructions.  Assessment and plan: Doing well following bilateral mastectomy. I will see her back in 6 months.

## 2011-12-09 NOTE — Patient Instructions (Signed)
Continue stretching exercises

## 2011-12-13 ENCOUNTER — Other Ambulatory Visit: Payer: Self-pay | Admitting: *Deleted

## 2011-12-13 ENCOUNTER — Other Ambulatory Visit: Payer: Self-pay | Admitting: Physician Assistant

## 2011-12-13 ENCOUNTER — Other Ambulatory Visit (HOSPITAL_BASED_OUTPATIENT_CLINIC_OR_DEPARTMENT_OTHER): Payer: Self-pay | Admitting: Lab

## 2011-12-13 ENCOUNTER — Encounter: Payer: Self-pay | Admitting: Oncology

## 2011-12-13 DIAGNOSIS — C50919 Malignant neoplasm of unspecified site of unspecified female breast: Secondary | ICD-10-CM

## 2011-12-13 LAB — CBC WITH DIFFERENTIAL/PLATELET
HCT: 37.2 % (ref 34.8–46.6)
MCHC: 32.8 g/dL (ref 31.5–36.0)
MCV: 75.5 fL — ABNORMAL LOW (ref 79.5–101.0)
Platelets: 424 10*3/uL — ABNORMAL HIGH (ref 145–400)
RBC: 4.93 10*6/uL (ref 3.70–5.45)

## 2011-12-13 LAB — MANUAL DIFFERENTIAL
EOS: 0 % (ref 0–7)
LYMPH: 10 % — ABNORMAL LOW (ref 14–49)
MONO: 5 % (ref 0–14)
Metamyelocytes: 6 % — ABNORMAL HIGH (ref 0–0)
Myelocytes: 10 % — ABNORMAL HIGH (ref 0–0)
Other Cell: 0 % (ref 0–0)
PLT EST: ADEQUATE

## 2011-12-13 MED ORDER — OXYCODONE-ACETAMINOPHEN 5-325 MG PO TABS: 1.0000 | ORAL_TABLET | ORAL | Status: AC | PRN

## 2011-12-17 ENCOUNTER — Encounter: Payer: Self-pay | Admitting: Oncology

## 2011-12-17 NOTE — Progress Notes (Signed)
Ms. Cotterill Received Medication from [facility] on 11/15/11 for $ 65.31 and on 12/13/11 Ms. Gutter received $50.00 gas card. New [facility] Balance is $284.69.

## 2011-12-20 ENCOUNTER — Ambulatory Visit: Payer: Self-pay

## 2011-12-20 ENCOUNTER — Other Ambulatory Visit: Payer: Self-pay | Admitting: Lab

## 2011-12-20 ENCOUNTER — Ambulatory Visit: Payer: Self-pay | Admitting: Physician Assistant

## 2011-12-22 ENCOUNTER — Other Ambulatory Visit: Payer: Self-pay | Admitting: Lab

## 2011-12-22 ENCOUNTER — Ambulatory Visit: Payer: Self-pay | Admitting: Physician Assistant

## 2011-12-27 ENCOUNTER — Other Ambulatory Visit (HOSPITAL_BASED_OUTPATIENT_CLINIC_OR_DEPARTMENT_OTHER): Payer: Self-pay | Admitting: Lab

## 2011-12-27 ENCOUNTER — Other Ambulatory Visit: Payer: Self-pay | Admitting: *Deleted

## 2011-12-27 ENCOUNTER — Ambulatory Visit (HOSPITAL_BASED_OUTPATIENT_CLINIC_OR_DEPARTMENT_OTHER): Payer: Self-pay

## 2011-12-27 ENCOUNTER — Encounter: Payer: Self-pay | Admitting: Oncology

## 2011-12-27 ENCOUNTER — Ambulatory Visit (HOSPITAL_BASED_OUTPATIENT_CLINIC_OR_DEPARTMENT_OTHER): Payer: Self-pay | Admitting: Oncology

## 2011-12-27 VITALS — BP 162/96 | HR 77 | Temp 97.7°F | Ht 65.0 in | Wt 222.6 lb

## 2011-12-27 DIAGNOSIS — C50919 Malignant neoplasm of unspecified site of unspecified female breast: Secondary | ICD-10-CM

## 2011-12-27 DIAGNOSIS — Z5112 Encounter for antineoplastic immunotherapy: Secondary | ICD-10-CM

## 2011-12-27 DIAGNOSIS — Z79899 Other long term (current) drug therapy: Secondary | ICD-10-CM

## 2011-12-27 DIAGNOSIS — Z170 Estrogen receptor positive status [ER+]: Secondary | ICD-10-CM

## 2011-12-27 DIAGNOSIS — Z5111 Encounter for antineoplastic chemotherapy: Secondary | ICD-10-CM

## 2011-12-27 DIAGNOSIS — C50419 Malignant neoplasm of upper-outer quadrant of unspecified female breast: Secondary | ICD-10-CM

## 2011-12-27 LAB — COMPREHENSIVE METABOLIC PANEL
AST: 19 U/L (ref 0–37)
Albumin: 3.8 g/dL (ref 3.5–5.2)
Alkaline Phosphatase: 99 U/L (ref 39–117)
BUN: 15 mg/dL (ref 6–23)
Creatinine, Ser: 0.87 mg/dL (ref 0.50–1.10)
Glucose, Bld: 126 mg/dL — ABNORMAL HIGH (ref 70–99)
Potassium: 3.1 mEq/L — ABNORMAL LOW (ref 3.5–5.3)
Total Bilirubin: 0.3 mg/dL (ref 0.3–1.2)

## 2011-12-27 LAB — CBC WITH DIFFERENTIAL/PLATELET
BASO%: 0.1 % (ref 0.0–2.0)
Basophils Absolute: 0 10*3/uL (ref 0.0–0.1)
EOS%: 0 % (ref 0.0–7.0)
HCT: 37 % (ref 34.8–46.6)
HGB: 12.2 g/dL (ref 11.6–15.9)
MCH: 25.4 pg (ref 25.1–34.0)
MCHC: 33 g/dL (ref 31.5–36.0)
MCV: 76.9 fL — ABNORMAL LOW (ref 79.5–101.0)
MONO%: 3.2 % (ref 0.0–14.0)
NEUT%: 89.3 % — ABNORMAL HIGH (ref 38.4–76.8)
lymph#: 0.9 10*3/uL (ref 0.9–3.3)

## 2011-12-27 MED ORDER — DEXAMETHASONE SODIUM PHOSPHATE 4 MG/ML IJ SOLN
20.0000 mg | Freq: Once | INTRAMUSCULAR | Status: AC
  Administered 2011-12-27: 20 mg via INTRAVENOUS

## 2011-12-27 MED ORDER — SODIUM CHLORIDE 0.9 % IV SOLN
Freq: Once | INTRAVENOUS | Status: AC
  Administered 2011-12-27: 11:00:00 via INTRAVENOUS

## 2011-12-27 MED ORDER — HEPARIN SOD (PORK) LOCK FLUSH 100 UNIT/ML IV SOLN
500.0000 [IU] | Freq: Once | INTRAVENOUS | Status: AC | PRN
  Administered 2011-12-27: 500 [IU]
  Filled 2011-12-27: qty 5

## 2011-12-27 MED ORDER — DOCETAXEL CHEMO INJECTION 160 MG/16ML
75.0000 mg/m2 | Freq: Once | INTRAVENOUS | Status: AC
  Administered 2011-12-27: 160 mg via INTRAVENOUS
  Filled 2011-12-27: qty 16

## 2011-12-27 MED ORDER — DIPHENHYDRAMINE HCL 25 MG PO CAPS
25.0000 mg | ORAL_CAPSULE | Freq: Once | ORAL | Status: AC
  Administered 2011-12-27: 25 mg via ORAL

## 2011-12-27 MED ORDER — ONDANSETRON 16 MG/50ML IVPB (CHCC)
16.0000 mg | Freq: Once | INTRAVENOUS | Status: AC
  Administered 2011-12-27: 16 mg via INTRAVENOUS

## 2011-12-27 MED ORDER — OXYCODONE-ACETAMINOPHEN 5-325 MG PO TABS: 1.0000 | ORAL_TABLET | ORAL | Status: AC | PRN

## 2011-12-27 MED ORDER — SODIUM CHLORIDE 0.9 % IJ SOLN
10.0000 mL | INTRAMUSCULAR | Status: DC | PRN
  Administered 2011-12-27: 10 mL
  Filled 2011-12-27: qty 10

## 2011-12-27 MED ORDER — ACETAMINOPHEN 325 MG PO TABS
650.0000 mg | ORAL_TABLET | Freq: Once | ORAL | Status: AC
  Administered 2011-12-27: 650 mg via ORAL

## 2011-12-27 MED ORDER — SODIUM CHLORIDE 0.9 % IV SOLN
750.0000 mg | Freq: Once | INTRAVENOUS | Status: AC
  Administered 2011-12-27: 750 mg via INTRAVENOUS
  Filled 2011-12-27: qty 75

## 2011-12-27 MED ORDER — TRASTUZUMAB CHEMO INJECTION 440 MG
6.0000 mg/kg | Freq: Once | INTRAVENOUS | Status: AC
  Administered 2011-12-27: 609 mg via INTRAVENOUS
  Filled 2011-12-27: qty 29

## 2011-12-27 NOTE — Patient Instructions (Signed)
Instructed to call if any problems, aware of next apt

## 2011-12-27 NOTE — Progress Notes (Signed)
Hematology and Oncology Follow Up Visit  [name] [id number] Sep 27, 1961 51 y.o. 12/27/2011    HPI: At age of 81, [name] was referred by Dr. Leonard Schwartz. [place] for treatment of left breast carcinoma.  The patient palpated a mass in her left breast and was scheduled by Dr. Audria Nine for mammogram at the [facility] Center. Mammogram was obtained on 06/18/2011, and was compared with prior mammogram in January of 2009. There was a new ill defined density in the superior portion of the left breast which was palpable. There were multiple pleomorphic microcalcifications extending throughout the upper outer quadrant worrisome for diffuse DCIS. Several markedly enlarged lymph nodes were also palpable. By exam, the breast mass was approximately 5 cm in size. Left breast ultrasound measured the mass at 3.4 cm, irregular and hypoechoic with adjacent satellite nodules. A second mass in the left breast measured 1.4 cm. There were several enlarged axillary lymph nodes on the left, the largest measuring 5.4 cm.  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  Breast MRI on 07/05/2011 showed patchy nodular enhancement in the left breast, measuring up to 7.5 cm, with a second area of patchy nodular enhancement measuring 2.6 cm in the same breast, a third measuring 1.1 cm. Multiple enlarged left axillary lymph nodes, the largest measuring 4.4 cm by MRI. There was also an area in the right breast which had been biopsied on 07/01/2011 and showed only lobular carcinoma in situ. The patient also had a needle core biopsy of a lymph node ([id number]) on 07/01/2011, confirming metastatic carcinoma.  The patient subsequently underwent bilateral mastectomies under the care of Dr. Johna Sheriff on 08/10/2011.  Pathology [phone]) confirmed invasive ductal carcinoma with calcifications, grade  3, spanning 5.7 cm in the left breast. 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 There was evidence of lymphovascular invasion. 2 of 18 lymph nodes were involved on the left. Tumor was again ER +100%, PR +4%. HER-2/neu amplification was detected by CIS H, with a ratio of 2.53. Pathology of the right breast was positive for multiple foci of invasive lobular carcinoma, spanning 1.7 and 0.7 cm with lobular carcinoma in situ as well. Again there was lymphovascular invasion identified. 0 of one lymph node involved on the right side.  Adjuvant chemotherapy was delayed slightly due to to slow healing of the mastectomy incision. Once healing was complete, adjuvant chemotherapy was started on 11/15/2011, consisting of docetaxel, carboplatin, and trastuzumab.  Interim History:   The patient is tolerating chemotherapy remarkably well. She is "wide-open" today because of the steroids she took yesterday. She knows not to take them today and then to resume them tomorrow and the day after. She's having no nausea, no vomiting, feels tired after the third day, and has pain for about a week or 2 she says after receiving the Neulasta. She received 40 Percocet for this last time and I wrote her a new prescription for this today as noted below. She sleeps "okay" is having some hot flashes but not enough to bother with, has had no change in bowel or bladder habits.  Review of Systems:  No unusual headaches, visual changes, gait imbalance or dizziness, cough phlegm production or pleurisy, chest pain or pressure, palpitations, shortness of breath, no dysuria or hematuria, no melena or bright red blood per rectum, mild hyperpigmentation which is not a major concern to her as she knows it will resolve.  She has some "boils" currently one in the right axilla she wanted me to look at. Otherwise a detailed review of systems was noncontributory.   Medications: I have reviewed the patient's current medications. Current Outpatient Prescriptions    Medication Sig Dispense Refill  . amLODipine (NORVASC) 5 MG tablet Take 5 mg by mouth daily.        Marland Kitchen aspirin 325 MG tablet Take 325 mg by mouth daily.        Marland Kitchen atorvastatin (LIPITOR) 20 MG tablet Take 20 mg by mouth daily.        . clopidogrel (PLAVIX) 75 MG tablet Take 75 mg by mouth daily.        Marland Kitchen dexamethasone (DECADRON) 4 MG tablet Take 4 mg by mouth 2 (two) times daily with a meal.        . famotidine (PEPCID) 20 MG tablet Take 20 mg by mouth 2 (two) times daily.        . folic acid (FOLVITE) 1 MG tablet Take 1 mg by mouth as needed.       . isosorbide dinitrate (ISORDIL) 30 MG tablet Take 30 mg by mouth 4 (four) times daily.        Marland Kitchen lidocaine-prilocaine (EMLA) cream Apply topically as needed.  30 g  0  . metoprolol (TOPROL-XL) 100 MG 24 hr tablet Take 100 mg by mouth daily.        . Multiple Vitamins-Minerals (MULTIVITAMIN WITH MINERALS) tablet Take 1 tablet by mouth daily.        . nitroGLYCERIN (NITROSTAT) 0.4 MG SL tablet Place 1 tablet (0.4 mg total) under the tongue every 5 (five) minutes as needed for chest pain.  25 tablet  8  . ondansetron (ZOFRAN) 8 MG tablet Take by mouth every 12 (twelve) hours as needed.        Marland Kitchen POTASSIUM CHLORIDE CR PO Take by mouth daily.        . prochlorperazine (COMPAZINE) 10 MG tablet Take 10 mg by mouth every 6 (six) hours as needed.        . valsartan (DIOVAN) 320 MG tablet Take 320 mg by mouth daily.          Allergies: No Known Allergies   Physical Exam: Filed Vitals:   12/27/11 0948  BP: 162/96  Pulse: 77  Temp: 97.7 F (36.5 C)    HEENT:  Sclerae anicteric, conjunctivae pink.  Oropharynx clear.  No mucositis or candidiasis. Nodes:  No cervical, supraclavicular, or axillary lymphadenopathy palpated.   Breast Exam:  Status post bilateral mastectomies. No evidence of local recurrence.  Lungs:  Clear to auscultation bilaterally.  No crackles, rhonchi, or wheezes.   Heart:  Regular rate and rhythm.   Abdomen:  Soft, obese, nontender.   Positive bowel sounds.  No organomegaly or masses palpated.   Musculoskeletal:  No focal spinal tenderness to palpation.  Extremities:  Benign.  No peripheral edema or cyanosis.   Skin:    firm movable subcutaneous mass in the right axilla which is I think clearly at a developing boil or cyst there there is no erythema or tenderness associated with this. She has a Neuro:  Nonfocal.   Lab Results: Lab Results  Component Value Date   WBC 11.5* 12/27/2011   HGB 12.2 12/27/2011   HCT 37.0 12/27/2011   MCV 76.9* 12/27/2011   PLT 332 12/27/2011   NEUTROABS 10.3* 12/27/2011     Chemistry      Component Value Date/Time  NA 141 12/06/2011 1018   K 3.1* 12/06/2011 1018   CL 104 12/06/2011 1018   CO2 27 12/06/2011 1018   BUN 12 12/06/2011 1018   CREATININE 0.74 12/06/2011 1018      Component Value Date/Time   CALCIUM 9.9 12/06/2011 1018   ALKPHOS 97 12/06/2011 1018   AST 20 12/06/2011 1018   ALT 30 12/06/2011 1018   BILITOT 0.1* 12/06/2011 1018      Radiological Studies:  No new results found.   Impression: 51 year old [place] woman, status post bilateral mastectomies on 08/11/2011 for  (1) On the left, a 5.7 cm, grade 3, invasive ductal carcinoma with 2 of 18 nodes positive, and so T3N1 or Stage III; ER +90%, PR +30%, HER-2/neu positive with a ratio of 2.53, MIB-1 of 60%.   (2) On the right, there were multiple foci of invasive lobular carcinoma.   (3) Patient is receiving adjuvant chemotherapy consisting of docetaxel, carboplatin, and trastuzumab, to be given every 3 weeks for a total of 6 cycles, with Neulasta given on day 2 for granulocyte support, and trastuzumab to be continued to total one year..   Plan: this is day 1 of 3 of 4 planned cycles of cyclophosphamide/docetaxel/trastuzumab. She is tolerating chemotherapy remarkably well. Today I refill her dexamethasone and also her oxycodone/APAP 5/325, 40 tablets. She will return to see [name] in about 10 days to make sure she does not  have any intercurrent problems, then receive her fourth and final cycle of chemotherapy in 3 weeks. She will see me shortly after that. The plan is to continue and trastuzumab for one year. She will need postmastectomy radiation to start sometime in February.     MAGRINAT,GUSTAV C, PA-C 12/27/2011

## 2011-12-27 NOTE — Progress Notes (Signed)
Patient received one gas card today $50.00,her remaning balance [facility] $234.69.

## 2011-12-28 ENCOUNTER — Telehealth: Payer: Self-pay | Admitting: *Deleted

## 2011-12-28 ENCOUNTER — Ambulatory Visit: Payer: Self-pay

## 2011-12-28 ENCOUNTER — Telehealth: Payer: Self-pay | Admitting: Oncology

## 2011-12-28 NOTE — Telephone Encounter (Signed)
Pt called to R/S injection appointment.  Having car trouble and unable to get here today.

## 2011-12-28 NOTE — Telephone Encounter (Signed)
called pt and informed her that she can p/u her scheduled for jan-feb2013

## 2011-12-29 ENCOUNTER — Ambulatory Visit (HOSPITAL_BASED_OUTPATIENT_CLINIC_OR_DEPARTMENT_OTHER): Payer: Self-pay

## 2011-12-29 VITALS — BP 131/85 | HR 78 | Temp 97.6°F

## 2011-12-29 DIAGNOSIS — C50419 Malignant neoplasm of upper-outer quadrant of unspecified female breast: Secondary | ICD-10-CM

## 2011-12-29 DIAGNOSIS — C50919 Malignant neoplasm of unspecified site of unspecified female breast: Secondary | ICD-10-CM

## 2011-12-29 MED ORDER — PEGFILGRASTIM INJECTION 6 MG/0.6ML
6.0000 mg | Freq: Once | SUBCUTANEOUS | Status: AC
  Administered 2011-12-29: 6 mg via SUBCUTANEOUS
  Filled 2011-12-29: qty 0.6

## 2012-01-05 ENCOUNTER — Telehealth: Payer: Self-pay | Admitting: *Deleted

## 2012-01-05 ENCOUNTER — Other Ambulatory Visit: Payer: Self-pay | Admitting: Lab

## 2012-01-05 ENCOUNTER — Telehealth: Payer: Self-pay | Admitting: Oncology

## 2012-01-05 ENCOUNTER — Ambulatory Visit: Payer: Self-pay | Admitting: Physician Assistant

## 2012-01-05 NOTE — Telephone Encounter (Signed)
Pt called to r/s cancel her appts for today. S/w amy berry and she is aware. Per amy she will call the pt back. Pt asked about some pain medication

## 2012-01-05 NOTE — Telephone Encounter (Signed)
This RN called pt per cancelled appt for chemo follow up and request for Percocet.  Spoke with pt and informed her above prescription cannot be called in but requires pick up.  Per conversation appt for follow up rescheduled to tomorrow when pt is able to get out of the house.

## 2012-01-06 ENCOUNTER — Other Ambulatory Visit: Payer: Self-pay | Admitting: *Deleted

## 2012-01-06 ENCOUNTER — Ambulatory Visit: Payer: Self-pay | Admitting: Physician Assistant

## 2012-01-06 ENCOUNTER — Other Ambulatory Visit: Payer: Self-pay

## 2012-01-06 ENCOUNTER — Telehealth: Payer: Self-pay | Admitting: *Deleted

## 2012-01-06 DIAGNOSIS — C50919 Malignant neoplasm of unspecified site of unspecified female breast: Secondary | ICD-10-CM

## 2012-01-06 MED ORDER — OXYCODONE-ACETAMINOPHEN 5-325 MG PO TABS: 2.0000 | ORAL_TABLET | Freq: Four times a day (QID) | ORAL | Status: DC | PRN

## 2012-01-06 NOTE — Telephone Encounter (Signed)
Pt called to this RN to follow up per not coming in for appointment today.  Mardella states she was on her way to this office when she received a call from her family informing her of death of her cousin.  Pt called to inform this office and proceeded to see family.  Per this call discussed medical concern for lab review and assessment post chemo.  Emmarie denies fevers or other concerns. Plan is for pt to obtain cbc tomorrow when she will pick up percocet prescription.

## 2012-01-07 ENCOUNTER — Encounter: Payer: Self-pay | Admitting: *Deleted

## 2012-01-07 ENCOUNTER — Ambulatory Visit: Payer: Self-pay

## 2012-01-07 NOTE — Progress Notes (Unsigned)
Pt reports that she is unable to come in to pick up RX today. Was unable to keep appt. With AB on 01/06/12 and needs to R/S. CB #[phone]

## 2012-01-10 ENCOUNTER — Other Ambulatory Visit: Payer: Self-pay | Admitting: Lab

## 2012-01-17 ENCOUNTER — Ambulatory Visit: Payer: Self-pay | Admitting: Physician Assistant

## 2012-01-17 ENCOUNTER — Other Ambulatory Visit: Payer: Self-pay | Admitting: Lab

## 2012-01-17 ENCOUNTER — Other Ambulatory Visit: Payer: Self-pay | Admitting: Oncology

## 2012-01-17 ENCOUNTER — Ambulatory Visit: Payer: Self-pay

## 2012-01-18 ENCOUNTER — Ambulatory Visit (HOSPITAL_BASED_OUTPATIENT_CLINIC_OR_DEPARTMENT_OTHER): Payer: Self-pay | Admitting: Physician Assistant

## 2012-01-18 ENCOUNTER — Telehealth: Payer: Self-pay | Admitting: *Deleted

## 2012-01-18 ENCOUNTER — Ambulatory Visit: Payer: Self-pay

## 2012-01-18 ENCOUNTER — Encounter: Payer: Self-pay | Admitting: Physician Assistant

## 2012-01-18 ENCOUNTER — Other Ambulatory Visit (HOSPITAL_BASED_OUTPATIENT_CLINIC_OR_DEPARTMENT_OTHER): Payer: Self-pay | Admitting: Lab

## 2012-01-18 VITALS — BP 147/83 | HR 85 | Temp 98.2°F | Ht 65.0 in | Wt 228.3 lb

## 2012-01-18 DIAGNOSIS — Z09 Encounter for follow-up examination after completed treatment for conditions other than malignant neoplasm: Secondary | ICD-10-CM

## 2012-01-18 DIAGNOSIS — C50919 Malignant neoplasm of unspecified site of unspecified female breast: Secondary | ICD-10-CM

## 2012-01-18 DIAGNOSIS — G5690 Unspecified mononeuropathy of unspecified upper limb: Secondary | ICD-10-CM

## 2012-01-18 DIAGNOSIS — G5790 Unspecified mononeuropathy of unspecified lower limb: Secondary | ICD-10-CM

## 2012-01-18 DIAGNOSIS — I10 Essential (primary) hypertension: Secondary | ICD-10-CM

## 2012-01-18 DIAGNOSIS — E876 Hypokalemia: Secondary | ICD-10-CM

## 2012-01-18 LAB — CBC WITH DIFFERENTIAL/PLATELET
Basophils Absolute: 0.1 10*3/uL (ref 0.0–0.1)
EOS%: 0.5 % (ref 0.0–7.0)
Eosinophils Absolute: 0.1 10*3/uL (ref 0.0–0.5)
HGB: 11.4 g/dL — ABNORMAL LOW (ref 11.6–15.9)
MONO#: 0.9 10*3/uL (ref 0.1–0.9)
NEUT#: 10.6 10*3/uL — ABNORMAL HIGH (ref 1.5–6.5)
RDW: 27.5 % — ABNORMAL HIGH (ref 11.2–14.5)
WBC: 15 10*3/uL — ABNORMAL HIGH (ref 3.9–10.3)
lymph#: 3.3 10*3/uL (ref 0.9–3.3)

## 2012-01-18 MED ORDER — POTASSIUM CHLORIDE CRYS ER 20 MEQ PO TBCR: 20.0000 meq | EXTENDED_RELEASE_TABLET | Freq: Two times a day (BID) | ORAL | Status: DC

## 2012-01-18 MED ORDER — OXYCODONE-ACETAMINOPHEN 5-325 MG PO TABS: 2.0000 | ORAL_TABLET | Freq: Four times a day (QID) | ORAL | Status: DC | PRN

## 2012-01-18 NOTE — Telephone Encounter (Signed)
gave patient appointment for 01-2012 thru 02-2012 printed out calendar and gave to the patient

## 2012-01-18 NOTE — Progress Notes (Signed)
Hematology and Oncology Follow Up Visit  [name] [id number] Aug 22, 1961 51 y.o. 01/18/2012    HPI: At age of 56, [name] was referred by Dr. Leonard Schwartz. [place] for treatment of left breast carcinoma.  The patient palpated a mass in her left breast and was scheduled by Dr. Audria Nine for mammogram at the breast Center. Mammogram was obtained on 06/18/2011, and was compared with prior mammogram in January of 2009. There was a new ill defined density in the superior portion of the left breast which was palpable. There were multiple pleomorphic microcalcifications extending throughout the upper outer quadrant worrisome for diffuse DCIS. Several markedly enlarged lymph nodes were also palpable. By exam, the breast mass was approximately 5 cm in size. Left breast ultrasound measured the mass at 3.4 cm, irregular and hypoechoic with adjacent satellite nodules. A second mass in the left breast measured 1.4 cm. There were several enlarged axillary lymph nodes on the left, the largest measuring 5.4 cm.  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  Breast MRI on 07/05/2011 showed patchy nodular enhancement in the left breast, measuring up to 7.5 cm, with a second area of patchy nodular enhancement measuring 2.6 cm in the same breast, a third measuring 1.1 cm. Multiple enlarged left axillary lymph nodes, the largest measuring 4.4 cm by MRI. There was also an area in the right breast which had been biopsied on 07/01/2011 and showed only lobular carcinoma in situ. The patient also had a needle core biopsy of a lymph node ([id number]) on 07/01/2011, confirming metastatic carcinoma.  The patient subsequently underwent bilateral mastectomies under the care of Dr. Johna Sheriff on 08/10/2011.  Pathology [phone]) confirmed invasive ductal carcinoma with calcifications, grade  3, spanning 5.7 cm in the left breast. 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 There was evidence of lymphovascular invasion. 2 of 18 lymph nodes were involved on the left. Tumor was again ER +100%, PR +4%. HER-2/neu amplification was detected by CIS H, with a ratio of 2.53. Pathology of the right breast was positive for multiple foci of invasive lobular carcinoma, spanning 1.7 and 0.7 cm with lobular carcinoma in situ as well. Again there was lymphovascular invasion identified. 0 of one lymph node involved on the right side.  Adjuvant chemotherapy was delayed slightly due to to slow healing of the mastectomy incision. Once healing was complete, adjuvant chemotherapy was started on 11/15/2011, consisting of 6 q. 3 week cycles of docetaxel, carboplatin, and trastuzumab.  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 She's been receiving adjuvant chemotherapy and is due for day 1 cycle 4 of 6 planned q. three-week doses of docetaxel/carboplatin/trastuzumab today. She receives Neulasta on day 2 for granulocyte support.  [name] has many concerns to address today. Most concerning is the fact that she has developed some significant numbness and tingling in both the upper and lower extremities. She tells me her toes feel numb and hurt almost all the time. Her fingertips also feel numb, consistently, and this is beginning to affect some of her day-to-day activities. For example, she is having a difficult time buttoning a blouse or zipping a coat. This is a significant change as compared to her visit here 3 weeks ago prior to cycle 3.  [name] also has a lot of bony pain and joint pain, worsening following the Neulasta injection. She takes oxycodone/APAP affectively for  this pain in addition to the neuropathic pain, and requests a refill today.  [name]'s knows his sore. She has some blood in the mucus when she blows her nose and is using a saline nasal spray which helps. She is  trying to exercise and walk as much as she can, but feels tired, fatigued, and has shortness of breath with exertion. She denies any mouth ulcers or oral sensitivity and has had no nausea or change in bowel habits. No signs of abnormal bleeding. No fevers, chills, or night sweats. No skin changes or rashes.  Finally I will mention that [name] needs a referral to her primary care physician for routine physical, and followup of her known hypertension. [facility] and [facility] at the [facility] family practice.  A detailed review of systems is otherwise noncontributory as noted below.  Review of Systems: Constitutional: fatigue, no weight loss, fever, night sweats Eyes: blurry vision [id number] congestion and nasal discharge Cardiovascular: positive for - dyspnea on exertion negative for - chest pain, edema or irregular heartbeat Respiratory: positive for - shortness of breath negative for - cough, hemoptysis, orthopnea or pleuritic pain Neurological: positive for - numbness/tingling Dermatological: negative Gastrointestinal: no abdominal pain, nausea, change in bowel habits, or black or bloody stools Genito-Urinary: no dysuria, trouble voiding, or hematuria Hematological and Lymphatic: negative Breast: negative Musculoskeletal: positive for - muscle pain and bone pain - diffuse Remaining ROS negative.     Medications: I have reviewed the patient's current medications. Current Outpatient Prescriptions  Medication Sig Dispense Refill  . amLODipine (NORVASC) 5 MG tablet Take 5 mg by mouth daily.        Marland Kitchen aspirin 325 MG tablet Take 325 mg by mouth daily.        Marland Kitchen atorvastatin (LIPITOR) 20 MG tablet Take 20 mg by mouth daily.        . clopidogrel (PLAVIX) 75 MG tablet Take 75 mg by mouth daily.        Marland Kitchen dexamethasone (DECADRON) 4 MG tablet Take 4 mg by mouth 2 (two) times daily with a meal.        . famotidine (PEPCID) 20 MG tablet Take 20 mg  by mouth 2 (two) times daily.        . folic acid (FOLVITE) 1 MG tablet Take 1 mg by mouth as needed.       . isosorbide dinitrate (ISORDIL) 30 MG tablet Take 30 mg by mouth 4 (four) times daily.        Marland Kitchen lidocaine-prilocaine (EMLA) cream Apply topically as needed.  30 g  0  . metoprolol (TOPROL-XL) 100 MG 24 hr tablet Take 100 mg by mouth daily.        . Multiple Vitamins-Minerals (MULTIVITAMIN WITH MINERALS) tablet Take 1 tablet by mouth daily.        . nitroGLYCERIN (NITROSTAT) 0.4 MG SL tablet Place 1 tablet (0.4 mg total) under the tongue every 5 (five) minutes as needed for chest pain.  25 tablet  8  . ondansetron (ZOFRAN) 8 MG tablet Take by mouth every 12 (twelve) hours as needed.        Marland Kitchen oxyCODONE-acetaminophen (PERCOCET) 5-325 MG per tablet Take 2 tablets by mouth every 6 (six) hours as needed for pain (maximum of 8 tablets daily).  60 tablet  0  . potassium chloride SA (K-DUR,KLOR-CON) 20 MEQ tablet Take 1 tablet (20 mEq total) by mouth 2 (two) times  daily.  30 tablet  1  . prochlorperazine (COMPAZINE) 10 MG tablet Take 10 mg by mouth every 6 (six) hours as needed.        . valsartan (DIOVAN) 320 MG tablet Take 320 mg by mouth daily.          Allergies: No Known Allergies   Physical Exam: Filed Vitals:   01/18/12 1459  BP: 147/83  Pulse: 85  Temp: 98.2 F (36.8 C)    HEENT:  Sclerae anicteric, conjunctivae pink.  Oropharynx clear.  No mucositis or candidiasis. Nodes:  No cervical, supraclavicular, or axillary lymphadenopathy palpated.   Breast Exam:  Deferred  Lungs:  Clear to auscultation bilaterally.  No crackles, rhonchi, or wheezes.   Heart:  Regular rate and rhythm.   Abdomen:  Soft, obese, nontender.  Positive bowel sounds.   Musculoskeletal:  No focal spinal tenderness to palpation.  Extremities:  Benign.  No peripheral edema or cyanosis.   Skin:   Benign Neuro:  Nonfocal.   Lab Results: Lab Results  Component Value Date   WBC 15.0* 01/18/2012   HGB 11.4*  01/18/2012   HCT 34.1* 01/18/2012   MCV 80.8 01/18/2012   PLT 467* 01/18/2012   NEUTROABS 10.6* 01/18/2012     Chemistry      Component Value Date/Time   NA 138 12/27/2011 0924   K 3.1* 12/27/2011 0924   CL 102 12/27/2011 0924   CO2 25 12/27/2011 0924   BUN 15 12/27/2011 0924   CREATININE 0.87 12/27/2011 0924      Component Value Date/Time   CALCIUM 9.8 12/27/2011 0924   ALKPHOS 99 12/27/2011 0924   AST 19 12/27/2011 0924   ALT 24 12/27/2011 0924   BILITOT 0.3 12/27/2011 0924      Radiological Studies:  No new results found.   Impression: 51 year old [place] woman, status post bilateral mastectomies on 08/11/2011 for  (1) On the left, a 5.7 cm, grade 3, invasive ductal carcinoma with 2 of 18 nodes positive, and so T3N1 or Stage III; ER +90%, PR +30%, HER-2/neu positive with a ratio of 2.53, MIB-1 of 60%.   (2) On the right, there were multiple foci of invasive lobular carcinoma.   (3) Patient is receiving adjuvant chemotherapy consisting of docetaxel, carboplatin, and trastuzumab, to be given every 3 weeks for a total of 6 cycles, with Neulasta given on day 2 for granulocyte support, and trastuzumab to be continued to total one year. Patient will also need postmastectomy radiation.  Plan:  This case was reviewed with Dr. Darnelle Catalan. In light of Iris's increasing peripheral neuropathy, we will discontinue docetaxel, and replace it with gemcitabine. We will hold chemotherapy today, and that will return next week on February 4 for reevaluation. We will plan on initiating carboplatin/gemcitabine with trastuzumab the same day. She received gemcitabine on days one and 8, and carboplatin/trastuzumab on day 1 of each 21 day cycle. She has 3 more cycles of chemotherapy to complete. She'll likely need Neulasta on day 9, and we will discuss this at her next appointment here.  Also, since we are continuing with chemotherapy for 3 more cycles, we will postpone her appointment with Dr. Sharlett Iles did in radiation  oncology. This will be rescheduled, likely in March, after the completion of all of her adjuvant chemotherapy.  I've refilled her pain medication today in addition to her potassium supplement. Hopefully, since we are discontinuing the docetaxel, she will have less pain in the future, and need less pain medication. I  have also suggested that she began on vitamin B complex for the neuropathy.  This case has been reviewed the patient who voices her agreement and her understanding. She will call any changes or problems prior to her next scheduled appointment here.    Festus Pursel, PA-C 01/18/2012

## 2012-01-19 ENCOUNTER — Ambulatory Visit: Payer: Self-pay

## 2012-01-19 NOTE — Progress Notes (Signed)
[facility] today.  Orders placed to reschedule lab and visit.  

## 2012-01-20 ENCOUNTER — Other Ambulatory Visit: Payer: Self-pay | Admitting: *Deleted

## 2012-01-20 ENCOUNTER — Telehealth: Payer: Self-pay | Admitting: *Deleted

## 2012-01-20 DIAGNOSIS — C50919 Malignant neoplasm of unspecified site of unspecified female breast: Secondary | ICD-10-CM

## 2012-01-20 MED ORDER — TRIAMTERENE-HCTZ 37.5-25 MG PO CAPS: 1.0000 | ORAL_CAPSULE | Freq: Every day | ORAL | Status: DC

## 2012-01-20 NOTE — Telephone Encounter (Signed)
Per MD review recommended for swelling dyazide.  Script called to pharmacy and to patient.

## 2012-01-21 ENCOUNTER — Telehealth: Payer: Self-pay | Admitting: Oncology

## 2012-01-21 NOTE — Telephone Encounter (Signed)
Returned a phone call to patient, left message

## 2012-01-24 ENCOUNTER — Ambulatory Visit: Payer: Self-pay | Admitting: Physician Assistant

## 2012-01-24 ENCOUNTER — Other Ambulatory Visit: Payer: Self-pay | Admitting: Lab

## 2012-01-24 ENCOUNTER — Ambulatory Visit: Payer: Self-pay

## 2012-01-24 ENCOUNTER — Ambulatory Visit: Payer: Self-pay | Admitting: Radiation Oncology

## 2012-01-24 ENCOUNTER — Telehealth: Payer: Self-pay | Admitting: Oncology

## 2012-01-24 NOTE — Telephone Encounter (Signed)
Returned a call to patient, left patient a message.

## 2012-01-25 NOTE — Progress Notes (Signed)
Patient canceled her appt on 01/24/12.

## 2012-01-27 ENCOUNTER — Ambulatory Visit: Payer: Self-pay | Admitting: Oncology

## 2012-01-27 ENCOUNTER — Other Ambulatory Visit: Payer: Self-pay | Admitting: Lab

## 2012-01-31 ENCOUNTER — Telehealth: Payer: Self-pay | Admitting: *Deleted

## 2012-01-31 ENCOUNTER — Other Ambulatory Visit: Payer: Self-pay | Admitting: Oncology

## 2012-01-31 ENCOUNTER — Other Ambulatory Visit: Payer: Self-pay | Admitting: Physician Assistant

## 2012-01-31 ENCOUNTER — Other Ambulatory Visit: Payer: Self-pay | Admitting: Lab

## 2012-01-31 ENCOUNTER — Other Ambulatory Visit: Payer: Self-pay | Admitting: *Deleted

## 2012-01-31 ENCOUNTER — Ambulatory Visit (HOSPITAL_BASED_OUTPATIENT_CLINIC_OR_DEPARTMENT_OTHER): Payer: Self-pay

## 2012-01-31 VITALS — BP 119/74 | HR 75 | Temp 97.4°F

## 2012-01-31 DIAGNOSIS — Z5111 Encounter for antineoplastic chemotherapy: Secondary | ICD-10-CM

## 2012-01-31 DIAGNOSIS — C50919 Malignant neoplasm of unspecified site of unspecified female breast: Secondary | ICD-10-CM

## 2012-01-31 DIAGNOSIS — E876 Hypokalemia: Secondary | ICD-10-CM

## 2012-01-31 LAB — CBC WITH DIFFERENTIAL/PLATELET
Eosinophils Absolute: 0.2 10*3/uL (ref 0.0–0.5)
HCT: 38.6 % (ref 34.8–46.6)
HGB: 13.1 g/dL (ref 11.6–15.9)
LYMPH%: 22.9 % (ref 14.0–49.7)
MONO#: 0.5 10*3/uL (ref 0.1–0.9)
NEUT%: 67.6 % (ref 38.4–76.8)
Platelets: 478 10*3/uL — ABNORMAL HIGH (ref 145–400)
WBC: 7.5 10*3/uL (ref 3.9–10.3)

## 2012-01-31 LAB — COMPREHENSIVE METABOLIC PANEL
BUN: 13 mg/dL (ref 6–23)
CO2: 26 mEq/L (ref 19–32)
Calcium: 10.1 mg/dL (ref 8.4–10.5)
Chloride: 100 mEq/L (ref 96–112)
Creatinine, Ser: 0.95 mg/dL (ref 0.50–1.10)
Glucose, Bld: 110 mg/dL — ABNORMAL HIGH (ref 70–99)

## 2012-01-31 MED ORDER — SODIUM CHLORIDE 0.9 % IV SOLN
800.0000 mg/m2 | Freq: Once | INTRAVENOUS | Status: AC
  Administered 2012-01-31: 1748 mg via INTRAVENOUS
  Filled 2012-01-31: qty 46

## 2012-01-31 MED ORDER — OXYCODONE-ACETAMINOPHEN 5-325 MG PO TABS: 2.0000 | ORAL_TABLET | Freq: Four times a day (QID) | ORAL | Status: DC | PRN

## 2012-01-31 MED ORDER — TRASTUZUMAB CHEMO INJECTION 440 MG
6.0000 mg/kg | Freq: Once | INTRAVENOUS | Status: AC
  Administered 2012-01-31: 609 mg via INTRAVENOUS
  Filled 2012-01-31: qty 29

## 2012-01-31 MED ORDER — DEXAMETHASONE SODIUM PHOSPHATE 4 MG/ML IJ SOLN
20.0000 mg | Freq: Once | INTRAMUSCULAR | Status: AC
  Administered 2012-01-31: 20 mg via INTRAVENOUS

## 2012-01-31 MED ORDER — DIPHENHYDRAMINE HCL 25 MG PO CAPS
25.0000 mg | ORAL_CAPSULE | Freq: Once | ORAL | Status: AC
  Administered 2012-01-31: 25 mg via ORAL

## 2012-01-31 MED ORDER — ACETAMINOPHEN 325 MG PO TABS
650.0000 mg | ORAL_TABLET | Freq: Once | ORAL | Status: AC
  Administered 2012-01-31: 650 mg via ORAL

## 2012-01-31 MED ORDER — ONDANSETRON 16 MG/50ML IVPB (CHCC)
16.0000 mg | Freq: Once | INTRAVENOUS | Status: AC
  Administered 2012-01-31: 16 mg via INTRAVENOUS

## 2012-01-31 MED ORDER — HEPARIN SOD (PORK) LOCK FLUSH 100 UNIT/ML IV SOLN
500.0000 [IU] | Freq: Once | INTRAVENOUS | Status: AC | PRN
  Administered 2012-01-31: 500 [IU]
  Filled 2012-01-31: qty 5

## 2012-01-31 MED ORDER — SODIUM CHLORIDE 0.9 % IV SOLN
Freq: Once | INTRAVENOUS | Status: AC
  Administered 2012-01-31: 12:00:00 via INTRAVENOUS

## 2012-01-31 MED ORDER — SODIUM CHLORIDE 0.9 % IV SOLN
750.0000 mg | Freq: Once | INTRAVENOUS | Status: AC
  Administered 2012-01-31: 750 mg via INTRAVENOUS
  Filled 2012-01-31: qty 75

## 2012-01-31 MED ORDER — SODIUM CHLORIDE 0.9 % IJ SOLN
10.0000 mL | INTRAMUSCULAR | Status: DC | PRN
  Administered 2012-01-31: 10 mL
  Filled 2012-01-31: qty 10

## 2012-01-31 MED ORDER — SODIUM CHLORIDE 0.9 % IV SOLN: Freq: Once | INTRAVENOUS | Status: DC

## 2012-01-31 NOTE — Telephone Encounter (Signed)
made [facility] appointment at [facility] at 2:15pm on 02-16-2012

## 2012-01-31 NOTE — Patient Instructions (Addendum)
[facility] Discharge Instructions for Patients Receiving Chemotherapy  Today you received the following chemotherapy agents gemzar,carboplatin, herceptin To help prevent nausea and vomiting after your treatment, we encourage you to take your nausea medication zofran  Every 8 hours  if needed If you develop nausea and vomiting that is not controlled by your nausea medication, call the clinic. If it is after clinic hours your family physician or the after hours number for the clinic or go to the Emergency Department.   BELOW ARE SYMPTOMS THAT SHOULD BE REPORTED IMMEDIATELY:  *FEVER GREATER THAN 100.5 F  *CHILLS WITH OR WITHOUT FEVER  NAUSEA AND VOMITING THAT IS NOT CONTROLLED WITH YOUR NAUSEA MEDICATION  *UNUSUAL SHORTNESS OF BREATH  *UNUSUAL BRUISING OR BLEEDING  TENDERNESS IN MOUTH AND THROAT WITH OR WITHOUT PRESENCE OF ULCERS  *URINARY PROBLEMS  *BOWEL PROBLEMS  UNUSUAL RASH Items with * indicate a potential emergency and should be followed up as soon as possible.  One of the nurses will contact you 24 hours after your treatment. Please let the nurse know about any problems that you may have experienced. Feel free to call the clinic you have any questions or concerns. The clinic phone number is [phone].   I have been informed and understand all the instructions given to me. I know to contact the clinic, my physician, or go to the Emergency Department if any problems should occur. I do not have any questions at this time, but understand that I may call the clinic during office hours   should I have any questions or need assistance in obtaining follow up care.    __________________________________________  _____________  __________ Signature of Patient or Authorized Representative            Date                   Time    __________________________________________ Nurse's Signature

## 2012-02-02 ENCOUNTER — Ambulatory Visit: Payer: Self-pay

## 2012-02-07 ENCOUNTER — Encounter: Payer: Self-pay | Admitting: Oncology

## 2012-02-07 ENCOUNTER — Other Ambulatory Visit: Payer: Self-pay | Admitting: Lab

## 2012-02-07 ENCOUNTER — Ambulatory Visit (HOSPITAL_BASED_OUTPATIENT_CLINIC_OR_DEPARTMENT_OTHER): Payer: Self-pay

## 2012-02-07 ENCOUNTER — Ambulatory Visit: Payer: Self-pay | Admitting: Oncology

## 2012-02-07 ENCOUNTER — Other Ambulatory Visit: Payer: Self-pay | Admitting: Physician Assistant

## 2012-02-07 VITALS — BP 114/63 | HR 68 | Temp 97.2°F

## 2012-02-07 DIAGNOSIS — C50919 Malignant neoplasm of unspecified site of unspecified female breast: Secondary | ICD-10-CM

## 2012-02-07 DIAGNOSIS — E876 Hypokalemia: Secondary | ICD-10-CM

## 2012-02-07 DIAGNOSIS — Z5111 Encounter for antineoplastic chemotherapy: Secondary | ICD-10-CM

## 2012-02-07 LAB — COMPREHENSIVE METABOLIC PANEL
ALT: 22 U/L (ref 0–35)
BUN: 13 mg/dL (ref 6–23)
CO2: 29 mEq/L (ref 19–32)
Creatinine, Ser: 1.14 mg/dL — ABNORMAL HIGH (ref 0.50–1.10)
Glucose, Bld: 102 mg/dL — ABNORMAL HIGH (ref 70–99)
Total Bilirubin: 0.4 mg/dL (ref 0.3–1.2)

## 2012-02-07 LAB — CBC WITH DIFFERENTIAL/PLATELET
Eosinophils Absolute: 0 10*3/uL (ref 0.0–0.5)
LYMPH%: 51.3 % — ABNORMAL HIGH (ref 14.0–49.7)
MCV: 83.5 fL (ref 79.5–101.0)
MONO%: 5.5 % (ref 0.0–14.0)
NEUT#: 1.1 10*3/uL — ABNORMAL LOW (ref 1.5–6.5)
NEUT%: 42.1 % (ref 38.4–76.8)
Platelets: 276 10*3/uL (ref 145–400)
RBC: 4.23 10*6/uL (ref 3.70–5.45)
nRBC: 0 % (ref 0–0)

## 2012-02-07 MED ORDER — SODIUM CHLORIDE 0.9 % IV SOLN
800.0000 mg/m2 | Freq: Once | INTRAVENOUS | Status: AC
  Administered 2012-02-07: 1748 mg via INTRAVENOUS
  Filled 2012-02-07: qty 46

## 2012-02-07 MED ORDER — SODIUM CHLORIDE 0.9 % IV SOLN
Freq: Once | INTRAVENOUS | Status: AC
  Administered 2012-02-07: 15:00:00 via INTRAVENOUS

## 2012-02-07 MED ORDER — OXYCODONE-ACETAMINOPHEN 5-325 MG PO TABS: 2.0000 | ORAL_TABLET | Freq: Four times a day (QID) | ORAL | Status: DC | PRN

## 2012-02-07 MED ORDER — PROCHLORPERAZINE MALEATE 10 MG PO TABS
10.0000 mg | ORAL_TABLET | Freq: Once | ORAL | Status: AC
  Administered 2012-02-07: 10 mg via ORAL

## 2012-02-07 MED ORDER — SODIUM CHLORIDE 0.9 % IJ SOLN
10.0000 mL | INTRAMUSCULAR | Status: DC | PRN
  Administered 2012-02-07: 10 mL
  Filled 2012-02-07: qty 10

## 2012-02-07 MED ORDER — HEPARIN SOD (PORK) LOCK FLUSH 100 UNIT/ML IV SOLN
500.0000 [IU] | Freq: Once | INTRAVENOUS | Status: AC | PRN
  Administered 2012-02-07: 500 [IU]
  Filled 2012-02-07: qty 5

## 2012-02-07 NOTE — Progress Notes (Signed)
1410-OK to treat despite CBC results from today-02/07/12 per Dr. Darnelle Catalan.

## 2012-02-07 NOTE — Progress Notes (Signed)
Hematology and Oncology Follow Up Visit  [name] [id number] 1961/06/15 51 y.o. 02/07/2012    HPI: At age of 5, [name] was referred by Dr. Leonard Schwartz. [place] for treatment of left breast carcinoma.  The patient palpated a mass in her left breast and was scheduled by Dr. Audria Nine for mammogram at the [facility] Center. Mammogram was obtained on 06/18/2011, and was compared with prior mammogram in January of 2009. There was a new ill defined density in the superior portion of the left breast which was palpable. There were multiple pleomorphic microcalcifications extending throughout the upper outer quadrant worrisome for diffuse DCIS. Several markedly enlarged lymph nodes were also palpable. By exam, the breast mass was approximately 5 cm in size. Left breast ultrasound measured the mass at 3.4 cm, irregular and hypoechoic with adjacent satellite nodules. A second mass in the left breast measured 1.4 cm. There were several enlarged axillary lymph nodes on the left, the largest measuring 5.4 cm.  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  Breast MRI on 07/05/2011 showed patchy nodular enhancement in the left breast, measuring up to 7.5 cm, with a second area of patchy nodular enhancement measuring 2.6 cm in the same breast, a third measuring 1.1 cm. Multiple enlarged left axillary lymph nodes, the largest measuring 4.4 cm by MRI. There was also an area in the right breast which had been biopsied on 07/01/2011 and showed only lobular carcinoma in situ. The patient also had a needle core biopsy of a lymph node ([id number]) on 07/01/2011, confirming metastatic carcinoma.  The patient subsequently underwent bilateral mastectomies under the care of Dr. Johna Sheriff on 08/10/2011.  Pathology [id number]) confirmed invasive ductal carcinoma with calcifications, grade  3, spanning 5.7 cm in the left breast. 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 There was evidence of lymphovascular invasion. 2 of 18 lymph nodes were involved on the left. Tumor was again ER +100%, PR +4%. HER-2/neu amplification was detected by CIS H, with a ratio of 2.53. Pathology of the right breast was positive for multiple foci of invasive lobular carcinoma, spanning 1.7 and 0.7 cm with lobular carcinoma in situ as well. Again there was lymphovascular invasion identified. 0 of one lymph node involved on the right side.  Adjuvant chemotherapy was delayed slightly due to to slow healing of the mastectomy incision. Once healing was complete, adjuvant chemotherapy was started on 11/15/2011, consisting of 6 q. 3 week cycles of docetaxel, carboplatin, and trastuzumab.  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 She's been receiving adjuvant chemotherapy and is status post 3 q. three-week cycles of docetaxel/carboplatin/trastuzumab. This was discontinued secondary to peripheral neuropathy which is gradually improving. Patient is now day 8 cycle 1 of 3 planned cycles of carboplatin/gemcitabine with trastuzumab. She receives gemcitabine on days one and 8, and carboplatin/trastuzumab on day 1 of each 21 day cycle. She receives Neulasta on day 9.   [name] has had a difficult time keeping many of her appointments for multiple reasons. In fact she was over 2 hours late for her appointment today with Dr. Darnelle Catalan, so I spoke with her briefly prior to her scheduled chemotherapy. We spent over half of her 20 minute visit today coordinating care and discussing the importance of keeping these followup appointments while receiving adjuvant chemotherapy. She does voice her understanding.  Overall, [name] tolerated her treatment last week  very well. She certainly feels that she tolerated this combination better. She had no problems with nausea or emesis. No change in bowel habits. No  fevers, chills, or night sweats. The peripheral neuropathy is gradually improving and both the fingers and toes.  Her biggest complaint continues to be generalized body pain. Her joints hurt. Her muscles and bones hurt. Her back hurts. This pain is worse following the Neulasta injection which she is scheduled to receive tomorrow on day 9. She takes oxycodone/APAP, no more than 8 tablets daily. She requests a refill today.   A detailed review of systems is otherwise noncontributory as noted below.  Review of Systems: Constitutional: fatigue, no weight loss, fever, night sweats Eyes: blurry vision [id number] Cardiovascular: positive for - dyspnea on exertion negative for - chest pain, edema or irregular heartbeat Respiratory: positive for - shortness of breath negative for - cough, hemoptysis, orthopnea or pleuritic pain Neurological: positive for - numbness/tingling Dermatological: negative Gastrointestinal: no abdominal pain, nausea, change in bowel habits, or black or bloody stools Genito-Urinary: no dysuria, trouble voiding, or hematuria Hematological and Lymphatic: negative Breast: negative Musculoskeletal: positive for - muscle pain and bone pain - diffuse Remaining ROS negative.     Medications: I have reviewed the patient's current medications. Current Outpatient Prescriptions  Medication Sig Dispense Refill  . amLODipine (NORVASC) 5 MG tablet Take 5 mg by mouth daily.        Marland Kitchen aspirin 325 MG tablet Take 325 mg by mouth daily.        Marland Kitchen atorvastatin (LIPITOR) 20 MG tablet Take 20 mg by mouth daily.        . clopidogrel (PLAVIX) 75 MG tablet Take 75 mg by mouth daily.        Marland Kitchen dexamethasone (DECADRON) 4 MG tablet Take 4 mg by mouth 2 (two) times daily with a meal.        . famotidine (PEPCID) 20 MG tablet Take 20 mg by mouth 2 (two) times daily.        . folic acid (FOLVITE) 1 MG tablet Take 1 mg by mouth as needed.       . isosorbide dinitrate (ISORDIL) 30 MG tablet Take 30  mg by mouth 4 (four) times daily.        Marland Kitchen lidocaine-prilocaine (EMLA) cream Apply topically as needed.  30 g  0  . metoprolol (TOPROL-XL) 100 MG 24 hr tablet Take 100 mg by mouth daily.        . Multiple Vitamins-Minerals (MULTIVITAMIN WITH MINERALS) tablet Take 1 tablet by mouth daily.        . nitroGLYCERIN (NITROSTAT) 0.4 MG SL tablet Place 1 tablet (0.4 mg total) under the tongue every 5 (five) minutes as needed for chest pain.  25 tablet  8  . ondansetron (ZOFRAN) 8 MG tablet Take by mouth every 12 (twelve) hours as needed.        Marland Kitchen oxyCODONE-acetaminophen (PERCOCET) 5-325 MG per tablet Take 2 tablets by mouth every 6 (six) hours as needed for pain (maximum of 8 tablets daily).  30 tablet  0  . potassium chloride SA (K-DUR,KLOR-CON) 20 MEQ tablet Take 1 tablet (20 mEq total) by mouth 2 (two) times daily.  30 tablet  1  . prochlorperazine (COMPAZINE) 10 MG tablet Take 10 mg by mouth every 6 (six) hours as needed.        . triamterene-hydrochlorothiazide (DYAZIDE) 37.5-25 MG per capsule Take 1 each (1 capsule total) by mouth daily.  30 capsule  1  . valsartan (DIOVAN) 320 MG tablet Take 320 mg by mouth daily.         No current facility-administered medications for this visit.   Facility-Administered Medications Ordered in Other Visits  Medication Dose Route Frequency Provider Last Rate Last Dose  . 0.9 %  sodium chloride infusion   Intravenous Once Zollie Scale, PA 20 mL/hr at 02/07/12 1443    . gemcitabine (GEMZAR) 1,748 mg in sodium chloride 0.9 % 100 mL chemo infusion  800 mg/m2 (Treatment Plan Actual) Intravenous Once Zollie Scale, PA 292 mL/hr at 02/07/12 1513 1,748 mg at 02/07/12 1513  . heparin lock flush 100 unit/mL  500 Units Intracatheter Once PRN Zollie Scale, PA      . prochlorperazine (COMPAZINE) tablet 10 mg  10 mg Oral Once Zollie Scale, PA   10 mg at 02/07/12 1443  . sodium chloride 0.9 % injection 10 mL  10 mL Intracatheter PRN Zollie Scale, PA        Allergies: No Known  Allergies   Physical Exam: Vitals:  Blood pressure 114/63; pulse 68; respirations 20; temp 97.2  Remainder of physical exam was deferred today.   Lab Results: Lab Results  Component Value Date   WBC 2.7* 02/07/2012   HGB 12.0 02/07/2012   HCT 35.3 02/07/2012   MCV 83.5 02/07/2012   PLT 276 02/07/2012   NEUTROABS 1.1* 02/07/2012     Chemistry      Component Value Date/Time   NA 137 01/31/2012 1139   K 3.1* 01/31/2012 1139   CL 100 01/31/2012 1139   CO2 26 01/31/2012 1139   BUN 13 01/31/2012 1139   CREATININE 0.95 01/31/2012 1139      Component Value Date/Time   CALCIUM 10.1 01/31/2012 1139   ALKPHOS 80 01/31/2012 1139   AST 21 01/31/2012 1139   ALT 19 01/31/2012 1139   BILITOT 0.4 01/31/2012 1139      Radiological Studies:  No new results found.   Impression: 50 year old [place] woman, status post bilateral mastectomies on 08/11/2011 for  (1) On the left, a 5.7 cm, grade 3, invasive ductal carcinoma with 2 of 18 nodes positive, and so T3N1 or Stage III; ER +90%, PR +30%, HER-2/neu positive with a ratio of 2.53, MIB-1 of 60%.   (2) On the right, there were multiple foci of invasive lobular carcinoma.   (3) Patient is status post 3 cycles of docetaxel, carboplatin, and trastuzumab, given every 3 weeks and discontinued in January due to peripheral neuropathy.  (4)  Now receiving gemcitabine/carboplatin with trastuzumab, the gemcitabine given on days one and 8, the carboplatin and trastuzumab given on day 1 of each 21 day cycle. The plan is to complete 3 additional cycles of this combination.   (5)  trastuzumab will be continued for a total of one year.   (6)  Patient will also need postmastectomy radiation.  Plan:  This case was reviewed with Dr. Darnelle Catalan and [name] will proceed to treatment today as scheduled for day 8 cycle 1 of carboplatin/gemcitabine with trastuzumab. She will return tomorrow for Neulasta on day 9.  Again, as noted above, has been quite bit of time  discussing with [name] the importance of keeping her scheduled appointments. I did refill her oxycodone/APAP with only 30 tablets, no refills. We will reassess her pain when she returns for followup visit next week on February 25. I explained that I would not be able to refill this medication unless she was seen for an office  visit.  This case has been reviewed the patient who voices her agreement and her understanding. She will call any changes or problems prior to her next scheduled appointment here.    Savannah Warth, PA-C 02/07/2012

## 2012-02-08 ENCOUNTER — Ambulatory Visit: Payer: Self-pay

## 2012-02-08 ENCOUNTER — Telehealth: Payer: Self-pay | Admitting: Oncology

## 2012-02-08 NOTE — Telephone Encounter (Signed)
lmonvm advising the pt to pick up her appt calendars for march and april

## 2012-02-10 ENCOUNTER — Ambulatory Visit (HOSPITAL_BASED_OUTPATIENT_CLINIC_OR_DEPARTMENT_OTHER): Payer: Self-pay

## 2012-02-10 VITALS — BP 115/73 | HR 91 | Temp 97.5°F

## 2012-02-10 DIAGNOSIS — Z5189 Encounter for other specified aftercare: Secondary | ICD-10-CM

## 2012-02-10 DIAGNOSIS — C50919 Malignant neoplasm of unspecified site of unspecified female breast: Secondary | ICD-10-CM

## 2012-02-10 MED ORDER — PEGFILGRASTIM INJECTION 6 MG/0.6ML
6.0000 mg | Freq: Once | SUBCUTANEOUS | Status: AC
  Administered 2012-02-10: 6 mg via SUBCUTANEOUS
  Filled 2012-02-10: qty 0.6

## 2012-02-14 ENCOUNTER — Ambulatory Visit (HOSPITAL_BASED_OUTPATIENT_CLINIC_OR_DEPARTMENT_OTHER): Payer: Self-pay | Admitting: Physician Assistant

## 2012-02-14 ENCOUNTER — Ambulatory Visit: Payer: Self-pay

## 2012-02-14 ENCOUNTER — Other Ambulatory Visit: Payer: Self-pay | Admitting: Lab

## 2012-02-14 ENCOUNTER — Encounter: Payer: Self-pay | Admitting: Physician Assistant

## 2012-02-14 VITALS — BP 152/87 | HR 92 | Temp 97.6°F | Ht 65.0 in | Wt 231.9 lb

## 2012-02-14 DIAGNOSIS — E876 Hypokalemia: Secondary | ICD-10-CM

## 2012-02-14 DIAGNOSIS — C50919 Malignant neoplasm of unspecified site of unspecified female breast: Secondary | ICD-10-CM

## 2012-02-14 DIAGNOSIS — I2510 Atherosclerotic heart disease of native coronary artery without angina pectoris: Secondary | ICD-10-CM

## 2012-02-14 LAB — COMPREHENSIVE METABOLIC PANEL
AST: 25 U/L (ref 0–37)
Albumin: 3.4 g/dL — ABNORMAL LOW (ref 3.5–5.2)
Alkaline Phosphatase: 96 U/L (ref 39–117)
BUN: 7 mg/dL (ref 6–23)
Creatinine, Ser: 1.02 mg/dL (ref 0.50–1.10)
Potassium: 3 mEq/L — ABNORMAL LOW (ref 3.5–5.3)

## 2012-02-14 LAB — CBC WITH DIFFERENTIAL/PLATELET
Basophils Absolute: 0 10*3/uL (ref 0.0–0.1)
EOS%: 0.1 % (ref 0.0–7.0)
HCT: 33.1 % — ABNORMAL LOW (ref 34.8–46.6)
HGB: 11 g/dL — ABNORMAL LOW (ref 11.6–15.9)
LYMPH%: 8.8 % — ABNORMAL LOW (ref 14.0–49.7)
MCH: 29.5 pg (ref 25.1–34.0)
MCV: 88.9 fL (ref 79.5–101.0)
MONO%: 1.7 % (ref 0.0–14.0)
NEUT%: 89.4 % — ABNORMAL HIGH (ref 38.4–76.8)

## 2012-02-14 MED ORDER — CLOPIDOGREL BISULFATE 75 MG PO TABS: 75.0000 mg | ORAL_TABLET | Freq: Every day | ORAL | Status: DC

## 2012-02-14 MED ORDER — OXYCODONE-ACETAMINOPHEN 5-325 MG PO TABS: 2.0000 | ORAL_TABLET | Freq: Four times a day (QID) | ORAL | Status: DC | PRN

## 2012-02-14 NOTE — Progress Notes (Signed)
Hematology and Oncology Follow Up Visit  [name] [id number] [birth year] 51 y.o. 02/14/2012    HPI: At age of 78, [name] was referred by Dr. Leonard Schwartz. [place] for treatment of left breast carcinoma.  The patient palpated a mass in her left breast and was scheduled by Dr. Audria Nine for mammogram at the breast Center. Mammogram was obtained on 06/18/2011, and was compared with prior mammogram in January of 2009. There was a new ill defined density in the superior portion of the left breast which was palpable. There were multiple pleomorphic microcalcifications extending throughout the upper outer quadrant worrisome for diffuse DCIS. Several markedly enlarged lymph nodes were also palpable. By exam, the breast mass was approximately 5 cm in size. Left breast ultrasound measured the mass at 3.4 cm, irregular and hypoechoic with adjacent satellite nodules. A second mass in the left breast measured 1.4 cm. There were several enlarged axillary lymph nodes on the left, the largest measuring 5.4 cm.  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  Breast MRI on 07/05/2011 showed patchy nodular enhancement in the left breast, measuring up to 7.5 cm, with a second area of patchy nodular enhancement measuring 2.6 cm in the same breast, a third measuring 1.1 cm. Multiple enlarged left axillary lymph nodes, the largest measuring 4.4 cm by MRI. There was also an area in the right breast which had been biopsied on 07/01/2011 and showed only lobular carcinoma in situ. The patient also had a needle core biopsy of a lymph node ([id number]) on 07/01/2011, confirming metastatic carcinoma.  The patient subsequently underwent bilateral mastectomies under the care of Dr. Johna Sheriff on 08/10/2011.  Pathology [phone]) confirmed invasive ductal carcinoma with calcifications, grade  3, spanning 5.7 cm in the left breast. 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 There was evidence of lymphovascular invasion. 2 of 18 lymph nodes were involved on the left. Tumor was again ER +100%, PR +4%. HER-2/neu amplification was detected by CIS H, with a ratio of 2.53. Pathology of the right breast was positive for multiple foci of invasive lobular carcinoma, spanning 1.7 and 0.7 cm with lobular carcinoma in situ as well. Again there was lymphovascular invasion identified. 0 of one lymph node involved on the right side.  Adjuvant chemotherapy was delayed slightly due to to slow healing of the mastectomy incision. Once healing was complete, adjuvant chemotherapy was started on 11/15/2011, consisting of 6 q. 3 week cycles of docetaxel, carboplatin, and trastuzumab.  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 She's been receiving adjuvant chemotherapy and is status post 3 q. three-week cycles of docetaxel/carboplatin/trastuzumab. This was discontinued secondary to peripheral neuropathy which is gradually improving. Patient is now day 15 cycle 1 of 3 planned cycles of carboplatin/gemcitabine with trastuzumab. She receives gemcitabine on days one and 8, and carboplatin/trastuzumab on day 1 of each 21 day cycle. She receives Neulasta on day 9.   [name] is gradually recovering from her first cycle of carbo/gemcitabine/trastuzumab. She tolerated treatment well, and has only a few complaints today. She did have bony aches and pains as well as joint pain following Neulasta injection last week. She has been taking her oxycodone/APAP appropriately, averaging 4 tablets daily. She does need a refill on this prescription today. She continues to have pain associated with peripheral neuropathy, secondary to her previous treatments with docetaxel. Her fingertips are still  numb. Her toes are "achy". Her nails are tender to touch, hyperpigmented, with a few of her fingernails loosening  from the nailbeds. There's no drainage or evidence of infection. Her skin is dark and dry, with some slight peeling on the soles of the feet. She's had no fevers, chills, or night sweats. No abnormal headaches. She feels a little forgetful at times.  [name] still trying to establish herself with a new primary care physician and would like another referral to the [facility]. She is out of her Plavix, and we discussed refilling this for a 30 day prescription.  A detailed review of systems is otherwise noncontributory as noted below.  Review of Systems: Constitutional: fatigue, no weight loss, fever, night sweats Eyes: blurry vision [id number] Cardiovascular: positive for - dyspnea on exertion negative for - chest pain, edema or irregular heartbeat Respiratory: positive for - shortness of breath negative for - cough, hemoptysis, orthopnea or pleuritic pain Neurological: positive for - numbness/tingling Dermatological: negative except for hyperpigmentation and nailbed changes Gastrointestinal: no abdominal pain, nausea, change in bowel habits, or black or bloody stools Genito-Urinary: no dysuria, trouble voiding, or hematuria Hematological and Lymphatic: negative Breast: negative Musculoskeletal: positive for - muscle pain and bone pain - diffuse Remaining ROS negative.     Medications: I have reviewed the patient's current medications. Current Outpatient Prescriptions  Medication Sig Dispense Refill  . amLODipine (NORVASC) 5 MG tablet Take 5 mg by mouth daily.        Marland Kitchen aspirin 325 MG tablet Take 325 mg by mouth daily.        Marland Kitchen atorvastatin (LIPITOR) 20 MG tablet Take 20 mg by mouth daily.        . clopidogrel (PLAVIX) 75 MG tablet Take 1 tablet (75 mg total) by mouth daily.  30 tablet  0  . dexamethasone (DECADRON) 4 MG tablet Take 4 mg by mouth 2 (two) times daily with a meal.        . famotidine (PEPCID) 20 MG tablet Take 20 mg by mouth 2 (two) times daily.         . folic acid (FOLVITE) 1 MG tablet Take 1 mg by mouth as needed.       . isosorbide dinitrate (ISORDIL) 30 MG tablet Take 30 mg by mouth 4 (four) times daily.        Marland Kitchen lidocaine-prilocaine (EMLA) cream Apply topically as needed.  30 g  0  . metoprolol (TOPROL-XL) 100 MG 24 hr tablet Take 100 mg by mouth daily.        . Multiple Vitamins-Minerals (MULTIVITAMIN WITH MINERALS) tablet Take 1 tablet by mouth daily.        . nitroGLYCERIN (NITROSTAT) 0.4 MG SL tablet Place 1 tablet (0.4 mg total) under the tongue every 5 (five) minutes as needed for chest pain.  25 tablet  8  . ondansetron (ZOFRAN) 8 MG tablet Take by mouth every 12 (twelve) hours as needed.        Marland Kitchen oxyCODONE-acetaminophen (PERCOCET) 5-325 MG per tablet Take 2 tablets by mouth every 6 (six) hours as needed for pain (maximum of 8 tablets daily).  30 tablet  0  . potassium chloride SA (K-DUR,KLOR-CON) 20 MEQ tablet Take 1 tablet (20 mEq total) by mouth 2 (two) times daily.  30 tablet  1  . prochlorperazine (COMPAZINE) 10 MG tablet Take 10 mg by mouth every 6 (six) hours as needed.        . triamterene-hydrochlorothiazide (DYAZIDE)  37.5-25 MG per capsule Take 1 each (1 capsule total) by mouth daily.  30 capsule  1  . valsartan (DIOVAN) 320 MG tablet Take 320 mg by mouth daily.          Allergies: No Known Allergies  Physical Exam: Filed Vitals:   02/14/12 0920  BP: 152/87  Pulse: 92  Temp: 97.6 F (36.4 C)   HEENT:  Sclerae anicteric, conjunctivae pink.  Oropharynx clear.  No mucositis or candidiasis.   Nodes:  No cervical, supraclavicular, or axillary lymphadenopathy palpated.  Breast Exam:  Deferred   Lungs:  Clear to auscultation bilaterally.  No crackles, rhonchi, or wheezes.   Heart:  Regular rate and rhythm.   Abdomen:  Soft, obese, nontender.  Positive bowel sounds.  No organomegaly or masses palpated.   Musculoskeletal:  No focal spinal tenderness to palpation.  Extremities:  Benign.  No peripheral edema or  cyanosis.   Skin: Hyperpigmented, with nail dyscrasia bilaterally on the upper extremities. No drainage, bleeding, or evidence of infection. Skin is otherwise benign. Neuro:  Nonfocal.   Lab Results: Lab Results  Component Value Date   WBC 16.9* 02/14/2012   HGB 11.0* 02/14/2012   HCT 33.1* 02/14/2012   MCV 88.9 02/14/2012   PLT 75* 02/14/2012   NEUTROABS 15.1* 02/14/2012     Chemistry      Component Value Date/Time   NA 139 02/14/2012 0906   K 3.0* 02/14/2012 0906   CL 103 02/14/2012 0906   CO2 27 02/14/2012 0906   BUN 7 02/14/2012 0906   CREATININE 1.02 02/14/2012 0906      Component Value Date/Time   CALCIUM 9.3 02/14/2012 0906   ALKPHOS 96 02/14/2012 0906   AST 25 02/14/2012 0906   ALT 24 02/14/2012 0906   BILITOT 0.2* 02/14/2012 0906      Radiological Studies:  No new results found.   Impression: 52 year old [place] woman, status post bilateral mastectomies on 08/11/2011 for  (1) On the left, a 5.7 cm, grade 3, invasive ductal carcinoma with 2 of 18 nodes positive, and so T3N1 or Stage III; ER +90%, PR +30%, HER-2/neu positive with a ratio of 2.53, MIB-1 of 60%.   (2) On the right, there were multiple foci of invasive lobular carcinoma.   (3) Patient is status post 3 cycles of docetaxel, carboplatin, and trastuzumab, given every 3 weeks and discontinued in January due to peripheral neuropathy.  (4)  Now receiving gemcitabine/carboplatin with trastuzumab, the gemcitabine given on days one and 8, the carboplatin and trastuzumab given on day 1 of each 21 day cycle. The plan is to complete 3 additional cycles of this combination.   (5)  trastuzumab will be continued for a total of one year.   (6)  Patient will also need postmastectomy radiation.  Plan:  [name] will return on Monday, March 4, for day 1 cycle 2 of carboplatin/gemcitabine/trastuzumab. She appears to be tolerating treatment well, but we will continue to follow her on a weekly basis. I have refilled her pain  medication, and also given her a 30 day prescription for her Plavix. We have requested our schedulers make an appointment for her at the [facility].  I have also advised [name] to increase her potassium to 20 mEq 3 times daily, and we will continue to follow her metabolic panel as well.   This case has been reviewed the patient who voices her agreement and her understanding. She will call any changes or problems prior  to her next scheduled appointment here.    Katherine Tout, PA-C 02/14/2012

## 2012-02-21 ENCOUNTER — Ambulatory Visit (HOSPITAL_BASED_OUTPATIENT_CLINIC_OR_DEPARTMENT_OTHER): Payer: Self-pay | Admitting: Physician Assistant

## 2012-02-21 ENCOUNTER — Other Ambulatory Visit: Payer: Self-pay | Admitting: *Deleted

## 2012-02-21 ENCOUNTER — Ambulatory Visit (HOSPITAL_COMMUNITY)
Admission: RE | Admit: 2012-02-21 | Discharge: 2012-02-21 | Disposition: A | Discharge status: Home / Self Care | Payer: Medicaid Other | Source: Ambulatory Visit | Attending: Oncology | Admitting: Oncology

## 2012-02-21 ENCOUNTER — Encounter: Payer: Self-pay | Admitting: Physician Assistant

## 2012-02-21 ENCOUNTER — Other Ambulatory Visit: Payer: Self-pay | Admitting: Lab

## 2012-02-21 ENCOUNTER — Ambulatory Visit: Payer: Self-pay

## 2012-02-21 VITALS — BP 120/72 | HR 68 | Temp 98.1°F | Ht 65.0 in | Wt 230.4 lb

## 2012-02-21 DIAGNOSIS — C50919 Malignant neoplasm of unspecified site of unspecified female breast: Secondary | ICD-10-CM

## 2012-02-21 DIAGNOSIS — E876 Hypokalemia: Secondary | ICD-10-CM

## 2012-02-21 DIAGNOSIS — M7989 Other specified soft tissue disorders: Secondary | ICD-10-CM

## 2012-02-21 DIAGNOSIS — I2510 Atherosclerotic heart disease of native coronary artery without angina pectoris: Secondary | ICD-10-CM

## 2012-02-21 DIAGNOSIS — M79609 Pain in unspecified limb: Secondary | ICD-10-CM | POA: Insufficient documentation

## 2012-02-21 LAB — CBC WITH DIFFERENTIAL/PLATELET
BASO%: 0.2 % (ref 0.0–2.0)
LYMPH%: 7.3 % — ABNORMAL LOW (ref 14.0–49.7)
MCHC: 33.5 g/dL (ref 31.5–36.0)
MCV: 90.4 fL (ref 79.5–101.0)
MONO%: 4.9 % (ref 0.0–14.0)
Platelets: 294 10*3/uL (ref 145–400)
RBC: 3.83 10*6/uL (ref 3.70–5.45)

## 2012-02-21 MED ORDER — POTASSIUM CHLORIDE CRYS ER 20 MEQ PO TBCR: 20.0000 meq | EXTENDED_RELEASE_TABLET | Freq: Three times a day (TID) | ORAL | Status: DC

## 2012-02-21 MED ORDER — SODIUM CHLORIDE 0.9 % IV SOLN
800.0000 mg/m2 | Freq: Once | INTRAVENOUS | Status: DC
  Filled 2012-02-21: qty 46

## 2012-02-21 MED ORDER — OXYCODONE-ACETAMINOPHEN 5-325 MG PO TABS: 2.0000 | ORAL_TABLET | Freq: Four times a day (QID) | ORAL | Status: DC | PRN

## 2012-02-21 MED ORDER — SODIUM CHLORIDE 0.9 % IV SOLN
750.0000 mg | Freq: Once | INTRAVENOUS | Status: DC
  Filled 2012-02-21: qty 75

## 2012-02-21 MED ORDER — HEPARIN SOD (PORK) LOCK FLUSH 100 UNIT/ML IV SOLN
500.0000 [IU] | Freq: Once | INTRAVENOUS | Status: AC | PRN
  Filled 2012-02-21: qty 5

## 2012-02-21 MED ORDER — SODIUM CHLORIDE 0.9 % IV SOLN: Freq: Once | INTRAVENOUS | Status: DC

## 2012-02-21 MED ORDER — ONDANSETRON 16 MG/50ML IVPB (CHCC): 16.0000 mg | Freq: Once | INTRAVENOUS | Status: DC

## 2012-02-21 MED ORDER — ISOSORBIDE DINITRATE 30 MG PO TABS: 30.0000 mg | ORAL_TABLET | Freq: Two times a day (BID) | ORAL | Status: DC

## 2012-02-21 MED ORDER — DEXAMETHASONE SODIUM PHOSPHATE 4 MG/ML IJ SOLN: 20.0000 mg | Freq: Once | INTRAMUSCULAR | Status: DC

## 2012-02-21 MED ORDER — SODIUM CHLORIDE 0.9 % IJ SOLN
10.0000 mL | INTRAMUSCULAR | Status: DC | PRN
  Filled 2012-02-21: qty 10

## 2012-02-21 NOTE — Progress Notes (Signed)
*  PRELIMINARY RESULTS* Vascular Ultrasound Right lower extremity venous duplex has been completed.  Preliminary findings: Right= No evidence of DVT or baker's cyst.  Farrel Demark RDMS 02/21/2012, 3:36 PM

## 2012-02-21 NOTE — Progress Notes (Signed)
Hematology and Oncology Follow Up Visit  [name] [id number] July 26, 1961 51 y.o. 02/21/2012    HPI: At age of 51, [name] was referred by Dr. Leonard Schwartz. [place] for treatment of left breast carcinoma.  The patient palpated a mass in her left breast and was scheduled by Dr. Audria Nine for mammogram at the [facility]. Mammogram was obtained on 06/18/2011, and was compared with prior mammogram in January of 2009. There was a new ill defined density in the superior portion of the left breast which was palpable. There were multiple pleomorphic microcalcifications extending throughout the upper outer quadrant worrisome for diffuse DCIS. Several markedly enlarged lymph nodes were also palpable. By exam, the breast mass was approximately 5 cm in size. Left breast ultrasound measured the mass at 3.4 cm, irregular and hypoechoic with adjacent satellite nodules. A second mass in the left breast measured 1.4 cm. There were several enlarged axillary lymph nodes on the left, the largest measuring 5.4 cm.  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  Breast MRI on 07/05/2011 showed patchy nodular enhancement in the left breast, measuring up to 7.5 cm, with a second area of patchy nodular enhancement measuring 2.6 cm in the same breast, a third measuring 1.1 cm. Multiple enlarged left axillary lymph nodes, the largest measuring 4.4 cm by MRI. There was also an area in the right breast which had been biopsied on 07/01/2011 and showed only lobular carcinoma in situ. The patient also had a needle core biopsy of a lymph node ([id number]) on 07/01/2011, confirming metastatic carcinoma.  The patient subsequently underwent bilateral mastectomies under the care of Dr. Johna Sheriff on 08/10/2011.  Pathology [phone]) confirmed invasive ductal carcinoma with calcifications, grade  3, spanning 5.7 cm in the left breast. 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 There was evidence of lymphovascular invasion. 2 of 18 lymph nodes were involved on the left. Tumor was again ER +100%, PR +4%. HER-2/neu amplification was detected by CIS H, with a ratio of 2.53. Pathology of the right breast was positive for multiple foci of invasive lobular carcinoma, spanning 1.7 and 0.7 cm with lobular carcinoma in situ as well. Again there was lymphovascular invasion identified. 0 of one lymph node involved on the right side.  Adjuvant chemotherapy was delayed slightly due to to slow healing of the mastectomy incision. Once healing was complete, adjuvant chemotherapy was started on 11/15/2011, consisting of 6 q. 3 week cycles of docetaxel, carboplatin, and trastuzumab.  Interim History:   [name] returns today for followup of her bilateral breast carcinomas for which she is receiving adjuvant chemotherapy.  She is status post 3 q. three-week cycles of docetaxel/carboplatin/trastuzumab. This was discontinued secondary to peripheral neuropathy which is gradually improving. Patient is now day 1 cycle 2 of 3 planned cycles of carboplatin/gemcitabine with trastuzumab. She receives gemcitabine on days 1 and 8, and carboplatin/trastuzumab on day 1 of each 21 day cycle. She receives Neulasta on day 9.   [name] is tired today. She has been increasingly tired over the past week. She's also had increased swelling in the right lower extremity with some pain in the calf. This is new in the last week. She's noticed no redness or palpable cords in the leg. There is no swelling in the left lower extremity.  [name] continues to have joint pain as well as pain associated with peripheral neuropathy in the lower extremities. She  takes Percocet which she finds helpful, and she needs a refill on this medication today. The pain does increase when she receives chemotherapy, especially with the Neulasta injections. She's eating and drinking  well denies nausea or change in bowel habits. No cough or increased shortness of breath. No orthopnea. No fevers, chills, or night sweats. No increased chest pain or pressure. She is due for an echocardiogram. Apparently that appointment was missed somewhere along the way, and she has not had an echo since November. Again she is asymptomatic of CHF.   [name] still trying to establish herself with a new primary care physician at [facility]. In the meanwhile she needs a refill on her isosorbide today, 30 mg by mouth twice a day. We refilled Plavix last week. Both of these are being filled for 30 days with no refills while she awaits her primary care appointment. They were also continues on her potassium, currently 20 mEq 3 times daily for hypokalemia, and needs a refill on that medication as well.  A detailed review of systems is otherwise noncontributory as noted below.  Review of Systems: Constitutional: fatigue, no weight loss, fever, night sweats Eyes: blurry vision [id number] Cardiovascular: positive for - dyspnea on exertion negative for - chest pain, irregular heartbeat Respiratory: positive for - shortness of breath negative for - cough, hemoptysis, orthopnea or pleuritic pain Neurological: positive for - numbness/tingling Dermatological: negative except for hyperpigmentation and nailbed changes Gastrointestinal: no abdominal pain, nausea, change in bowel habits, or black or bloody stools Genito-Urinary: no dysuria, trouble voiding, or hematuria Hematological and Lymphatic: negative Breast: negative Musculoskeletal: positive for - muscle pain and bone pain - diffuse. Swelling in right lower extremity with increased pain Remaining ROS negative.     Medications: I have reviewed the patient's current medications. Current Outpatient Prescriptions  Medication Sig Dispense Refill  . amLODipine (NORVASC) 5 MG tablet Take 5 mg by mouth daily.        Marland Kitchen aspirin  325 MG tablet Take 325 mg by mouth daily.        Marland Kitchen atorvastatin (LIPITOR) 20 MG tablet Take 20 mg by mouth daily.        . clopidogrel (PLAVIX) 75 MG tablet Take 1 tablet (75 mg total) by mouth daily.  30 tablet  0  . dexamethasone (DECADRON) 4 MG tablet Take 4 mg by mouth 2 (two) times daily with a meal.        . famotidine (PEPCID) 20 MG tablet Take 20 mg by mouth 2 (two) times daily.        . folic acid (FOLVITE) 1 MG tablet Take 1 mg by mouth as needed.       . isosorbide dinitrate (ISORDIL) 30 MG tablet Take 1 tablet (30 mg total) by mouth 2 (two) times daily.  60 tablet  0  . lidocaine-prilocaine (EMLA) cream Apply topically as needed.  30 g  0  . metoprolol (TOPROL-XL) 100 MG 24 hr tablet Take 100 mg by mouth daily.        . Multiple Vitamins-Minerals (MULTIVITAMIN WITH MINERALS) tablet Take 1 tablet by mouth daily.        . nitroGLYCERIN (NITROSTAT) 0.4 MG SL tablet Place 1 tablet (0.4 mg total) under the tongue every 5 (five) minutes as needed for chest pain.  25 tablet  8  . ondansetron (ZOFRAN) 8 MG tablet Take by mouth every 12 (twelve) hours as needed.        Marland Kitchen oxyCODONE-acetaminophen (PERCOCET)  5-325 MG per tablet Take 2 tablets by mouth every 6 (six) hours as needed for pain (maximum of 8 tablets daily).  30 tablet  0  . potassium chloride SA (K-DUR,KLOR-CON) 20 MEQ tablet Take 1 tablet (20 mEq total) by mouth 3 (three) times daily.  90 tablet  1  . prochlorperazine (COMPAZINE) 10 MG tablet Take 10 mg by mouth every 6 (six) hours as needed.        . triamterene-hydrochlorothiazide (DYAZIDE) 37.5-25 MG per capsule Take 1 each (1 capsule total) by mouth daily.  30 capsule  1  . valsartan (DIOVAN) 320 MG tablet Take 320 mg by mouth daily.          Allergies: No Known Allergies  Physical Exam: Filed Vitals:   02/21/12 1401  BP: 120/72  Pulse: 68  Temp: 98.1 F (36.7 C)   HEENT:  Sclerae anicteric, conjunctivae pink.  Oropharynx clear.  No mucositis or candidiasis.   Nodes:   No cervical, supraclavicular, or axillary lymphadenopathy palpated.  Breast Exam:  Deferred   Lungs:  Clear to auscultation bilaterally.  No crackles, rhonchi, or wheezes.   Heart:  Regular rate and rhythm.   Abdomen:  Soft, obese, nontender.  Positive bowel sounds.   Musculoskeletal:  No focal spinal tenderness to palpation.  Extremities: 1+ pitting edema isolated to the right lower extremity. Negative Homans sign. No palpable cords, no erythema. Left lower extremity is benign with no edema.  No peripheral cyanosis. Skin: Hyperpigmented, with nail dyscrasia bilaterally on the upper extremities. No drainage, bleeding, or evidence of infection. Skin is otherwise benign. Neuro:  Nonfocal.   Lab Results: Lab Results  Component Value Date   WBC 29.2* 02/21/2012   HGB 11.6 02/21/2012   HCT 34.7* 02/21/2012   MCV 90.4 02/21/2012   PLT 294 02/21/2012   NEUTROABS 25.5* 02/21/2012     Chemistry      Component Value Date/Time   NA 139 02/14/2012 0906   K 3.0* 02/14/2012 0906   CL 103 02/14/2012 0906   CO2 27 02/14/2012 0906   BUN 7 02/14/2012 0906   CREATININE 1.02 02/14/2012 0906      Component Value Date/Time   CALCIUM 9.3 02/14/2012 0906   ALKPHOS 96 02/14/2012 0906   AST 25 02/14/2012 0906   ALT 24 02/14/2012 0906   BILITOT 0.2* 02/14/2012 0906      Radiological Studies:  Both an echocardiogram and a Doppler of the right lower chest remedies are being scheduled for the patient today.   Impression: 51 year old [place] woman, status post bilateral mastectomies on 08/11/2011 for  (1) On the left, a 5.7 cm, grade 3, invasive ductal carcinoma with 2 of 18 nodes positive, and so T3N1 or Stage III; ER +90%, PR +30%, HER-2/neu positive with a ratio of 2.53, MIB-1 of 60%.   (2) On the right, there were multiple foci of invasive lobular carcinoma.   (3) Patient is status post 3 cycles of docetaxel, carboplatin, and trastuzumab, given every 3 weeks and discontinued in January due to peripheral  neuropathy.  (4)  Now receiving gemcitabine/carboplatin with trastuzumab, the gemcitabine given on days one and 8, the carboplatin and trastuzumab given on day 1 of each 21 day cycle. The plan is to complete 3 additional cycles of this combination.   (5)  trastuzumab will be continued for a total of one year.   (6)  Patient will also need postmastectomy radiation.  Plan:  Kaylana will proceed to treatment today as scheduled  for day 1, cycle 2 of carboplatin/gemcitabine. We will hold trastuzumab today, awaiting the results of her upcoming echocardiogram, especially in light of Treasa's cardiac history. In the meanwhile am requesting a Doppler of the right lower extremity to evaluate for possible DVT.  As noted above, I have refilled Nadiah is oxycodone/APAP, her potassium, and her isosorbide as requested.  She'll return to see me in one week on March 11, due for day 8 cycle 2 which will consist of gemcitabine alone.   This case has been reviewed the patient who voices her agreement and her understanding. She will call any changes or problems prior to her next scheduled appointment here.    Doni Widmer, PA-C 02/21/2012

## 2012-02-21 NOTE — Progress Notes (Unsigned)
Pt went to have dopplar before chemo.  Pt returned to [facility] and was arrived to chemo schedule.  Benetta Spar, RN paged patient x 3.  Lora Havens, RN paged patient x 3.  [facility]- Registration confirmed checking pt in for chemo appointment and looked for patient in lobby.  Lora Havens, RN went to lobby and looked and called for patient.  Also looked for patient in [facility].  Notified Amy Allyson Sabal, PA who also looked in lobby for patient.  No response from patient after looking for her for 30 minutes post her appearing arrived to infusion room schedule.  Will notify Dr. Darrall Dears RN and Zollie Scale, PA that pt will need to be rescheduled due to no showing for infusion appointment.

## 2012-02-21 NOTE — Telephone Encounter (Signed)
gve the pt her April 2013 appt calendar 

## 2012-02-22 ENCOUNTER — Other Ambulatory Visit: Payer: Self-pay | Admitting: Physician Assistant

## 2012-02-22 ENCOUNTER — Encounter: Payer: Self-pay | Admitting: *Deleted

## 2012-02-22 ENCOUNTER — Encounter: Payer: Self-pay | Admitting: Oncology

## 2012-02-22 ENCOUNTER — Ambulatory Visit (HOSPITAL_BASED_OUTPATIENT_CLINIC_OR_DEPARTMENT_OTHER): Payer: Self-pay

## 2012-02-22 DIAGNOSIS — C50919 Malignant neoplasm of unspecified site of unspecified female breast: Secondary | ICD-10-CM

## 2012-02-22 MED ORDER — ONDANSETRON 16 MG/50ML IVPB (CHCC)
16.0000 mg | Freq: Once | INTRAVENOUS | Status: AC
  Administered 2012-02-22: 16 mg via INTRAVENOUS

## 2012-02-22 MED ORDER — SODIUM CHLORIDE 0.9 % IJ SOLN
10.0000 mL | INTRAMUSCULAR | Status: DC | PRN
  Administered 2012-02-22: 10 mL via INTRAVENOUS
  Filled 2012-02-22: qty 10

## 2012-02-22 MED ORDER — SODIUM CHLORIDE 0.9 % IV SOLN
800.0000 mg/m2 | Freq: Once | INTRAVENOUS | Status: AC
  Administered 2012-02-22: 1748 mg via INTRAVENOUS
  Filled 2012-02-22: qty 46

## 2012-02-22 MED ORDER — HEPARIN SOD (PORK) LOCK FLUSH 100 UNIT/ML IV SOLN
500.0000 [IU] | Freq: Once | INTRAVENOUS | Status: AC
  Administered 2012-02-22: 500 [IU] via INTRAVENOUS
  Filled 2012-02-22: qty 5

## 2012-02-22 MED ORDER — SODIUM CHLORIDE 0.9 % IV SOLN
Freq: Once | INTRAVENOUS | Status: AC
  Administered 2012-02-22: 15:00:00 via INTRAVENOUS

## 2012-02-22 MED ORDER — DEXAMETHASONE SODIUM PHOSPHATE 4 MG/ML IJ SOLN
20.0000 mg | Freq: Once | INTRAMUSCULAR | Status: AC
  Administered 2012-02-22: 20 mg via INTRAVENOUS

## 2012-02-22 MED ORDER — SODIUM CHLORIDE 0.9 % IV SOLN
750.0000 mg | Freq: Once | INTRAVENOUS | Status: AC
  Administered 2012-02-22: 750 mg via INTRAVENOUS
  Filled 2012-02-22: qty 75

## 2012-02-22 NOTE — Progress Notes (Signed)
Patient received three prescriptions from [facility] on 02/21/12 $16.90,her remaning balance [facility] $217.79and [facility] $600.00

## 2012-02-22 NOTE — Patient Instructions (Signed)
Patient aware of next appointment; discharged home with no complaints. 

## 2012-02-22 NOTE — Progress Notes (Signed)
[name] give me a copy of her appeal letter for Medicaid from DSS. Her hearing is scheduled for February 28, 2012. [name] wanting to know who will represent her from [facility]. I notified [name] at [facility]. [name] stated this is an hospital account, so she forward the information to [facility]. [name] requested [name]. I forward request to Tiffany to contact [name]. Advised [name] is the person she needs to contact for further information regarding her hearing. Forward a copy of her hearing letter to medical records to attach to file.

## 2012-02-22 NOTE — Progress Notes (Unsigned)
This RN called to the [facility] facility at [facility]- and per automated system was directed to an MD VM line.  Message left stating this office wants pt care transferred from Dr Dow Adolph at the [facility] office to an MD at the [facility]. This RN name and return number given.

## 2012-02-23 ENCOUNTER — Ambulatory Visit: Payer: Self-pay

## 2012-02-28 ENCOUNTER — Ambulatory Visit: Payer: Self-pay | Admitting: Physician Assistant

## 2012-02-28 ENCOUNTER — Telehealth: Payer: Self-pay | Admitting: *Deleted

## 2012-02-28 ENCOUNTER — Ambulatory Visit (HOSPITAL_BASED_OUTPATIENT_CLINIC_OR_DEPARTMENT_OTHER): Payer: Self-pay

## 2012-02-28 ENCOUNTER — Other Ambulatory Visit (HOSPITAL_BASED_OUTPATIENT_CLINIC_OR_DEPARTMENT_OTHER): Payer: Self-pay | Admitting: Lab

## 2012-02-28 ENCOUNTER — Encounter: Payer: Self-pay | Admitting: Physician Assistant

## 2012-02-28 VITALS — BP 155/80 | HR 88 | Temp 98.0°F | Ht 65.0 in | Wt 233.3 lb

## 2012-02-28 DIAGNOSIS — Z5111 Encounter for antineoplastic chemotherapy: Secondary | ICD-10-CM

## 2012-02-28 DIAGNOSIS — C50919 Malignant neoplasm of unspecified site of unspecified female breast: Secondary | ICD-10-CM

## 2012-02-28 LAB — COMPREHENSIVE METABOLIC PANEL
ALT: 96 U/L — ABNORMAL HIGH (ref 0–35)
CO2: 28 mEq/L (ref 19–32)
Calcium: 9.4 mg/dL (ref 8.4–10.5)
Chloride: 102 mEq/L (ref 96–112)
Creatinine, Ser: 0.99 mg/dL (ref 0.50–1.10)
Glucose, Bld: 93 mg/dL (ref 70–99)
Sodium: 139 mEq/L (ref 135–145)
Total Protein: 6.7 g/dL (ref 6.0–8.3)

## 2012-02-28 LAB — CBC WITH DIFFERENTIAL/PLATELET
Basophils Absolute: 0 10*3/uL (ref 0.0–0.1)
Eosinophils Absolute: 0 10*3/uL (ref 0.0–0.5)
HGB: 10 g/dL — ABNORMAL LOW (ref 11.6–15.9)
LYMPH%: 28.4 % (ref 14.0–49.7)
MCV: 87.1 fL (ref 79.5–101.0)
MONO#: 0.1 10*3/uL (ref 0.1–0.9)
MONO%: 3.4 % (ref 0.0–14.0)
NEUT#: 2.8 10*3/uL (ref 1.5–6.5)
Platelets: 341 10*3/uL (ref 145–400)
RBC: 3.41 10*6/uL — ABNORMAL LOW (ref 3.70–5.45)
WBC: 4.1 10*3/uL (ref 3.9–10.3)
nRBC: 0 % (ref 0–0)

## 2012-02-28 MED ORDER — OXYCODONE-ACETAMINOPHEN 5-325 MG PO TABS: 2.0000 | ORAL_TABLET | Freq: Four times a day (QID) | ORAL | Status: DC | PRN

## 2012-02-28 MED ORDER — PROCHLORPERAZINE MALEATE 10 MG PO TABS
10.0000 mg | ORAL_TABLET | Freq: Once | ORAL | Status: AC
  Administered 2012-02-28: 10 mg via ORAL

## 2012-02-28 MED ORDER — SODIUM CHLORIDE 0.9 % IV SOLN
800.0000 mg/m2 | Freq: Once | INTRAVENOUS | Status: AC
  Administered 2012-02-28: 1748 mg via INTRAVENOUS
  Filled 2012-02-28: qty 46
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
## 2012-02-28 NOTE — Telephone Encounter (Signed)
made patient appointment for family practice at 03-08-2012 at 8:45am

## 2012-02-29 ENCOUNTER — Ambulatory Visit: Payer: Self-pay | Admitting: Oncology

## 2012-02-29 ENCOUNTER — Ambulatory Visit (HOSPITAL_BASED_OUTPATIENT_CLINIC_OR_DEPARTMENT_OTHER): Payer: Self-pay

## 2012-02-29 ENCOUNTER — Encounter: Payer: Self-pay | Admitting: Oncology

## 2012-02-29 VITALS — BP 100/67 | HR 82 | Temp 97.9°F

## 2012-02-29 DIAGNOSIS — C50419 Malignant neoplasm of upper-outer quadrant of unspecified female breast: Secondary | ICD-10-CM

## 2012-02-29 DIAGNOSIS — C50919 Malignant neoplasm of unspecified site of unspecified female breast: Secondary | ICD-10-CM

## 2012-02-29 MED ORDER — PEGFILGRASTIM INJECTION 6 MG/0.6ML
6.0000 mg | Freq: Once | SUBCUTANEOUS | Status: AC
  Administered 2012-02-29: 6 mg via SUBCUTANEOUS
  Filled 2012-02-29: qty 0.6

## 2012-02-29 NOTE — Progress Notes (Signed)
Patient stop me in the lobby on yesterday to let me know her medicaid hearing went really well.

## 2012-03-01 ENCOUNTER — Telehealth: Payer: Self-pay | Admitting: *Deleted

## 2012-03-01 NOTE — Telephone Encounter (Signed)
Message left by pt at 1507 with pt stating she is having some R ankle swelling but that she also is out of her fluid pill. Stanton Kidney also states she is taking the klor con. She is mildly dizzy and stated " I feel SOB ... No more kinda like limp on my right side ".  Return call number given as [phone].  Note pt speech is normal in cadence without distress per above conversation.  This RN returned call post retrieving message and obtained VM. Message left requesting a return call for appropriate help and also stated if pt is having symptoms she feels is stroke related she needs to proceed to the ER.

## 2012-03-02 ENCOUNTER — Telehealth: Payer: Self-pay | Admitting: *Deleted

## 2012-03-02 ENCOUNTER — Other Ambulatory Visit: Payer: Self-pay | Admitting: *Deleted

## 2012-03-02 ENCOUNTER — Ambulatory Visit (HOSPITAL_COMMUNITY): Payer: Self-pay

## 2012-03-02 DIAGNOSIS — C50919 Malignant neoplasm of unspecified site of unspecified female breast: Secondary | ICD-10-CM

## 2012-03-02 MED ORDER — TRIAMTERENE-HCTZ 37.5-25 MG PO CAPS: 1.0000 | ORAL_CAPSULE | Freq: Every day | ORAL | Status: DC

## 2012-03-02 NOTE — Telephone Encounter (Signed)
This RN was informed by [name] in financial that pt has medication coverage per [employer].  Call placed to pt and obtained identified VM.  Message left requesting pt to return call and give name of medication she needs refilled.

## 2012-03-02 NOTE — Telephone Encounter (Signed)
Pt returned call to this RN and stated medication needed is triamterene hctz.  This RN informed pt above will be called in to the pharmacy as well as discussed with pt noted increased liver enzymes probably related to use of pain medications. This RN informed pt to take the least amount possible due to levels.

## 2012-03-02 NOTE — Telephone Encounter (Signed)
Message left by pt stating " the symptoms are like a stroke ...but just my right ankle is swollen and I don't have the funds to get those pills so whatever you can do to get them called in would be appreciated "  [name] also states she rescheduled her echo to the 21st due to " I am treating a cold ".  Return call number given as [phone].  This RN called to [facility]ou[facility] and left message inquiring if pt has funds available for assistance.

## 2012-03-07 ENCOUNTER — Telehealth: Payer: Self-pay | Admitting: Family Medicine

## 2012-03-07 ENCOUNTER — Telehealth: Payer: Self-pay | Admitting: *Deleted

## 2012-03-07 ENCOUNTER — Other Ambulatory Visit: Payer: Self-pay | Admitting: *Deleted

## 2012-03-07 ENCOUNTER — Encounter: Payer: Self-pay | Admitting: Oncology

## 2012-03-07 DIAGNOSIS — C50919 Malignant neoplasm of unspecified site of unspecified female breast: Secondary | ICD-10-CM

## 2012-03-07 NOTE — Telephone Encounter (Signed)
This RN spoke with [name] at [facility] per referral for primary care.  [name] states post reviewing pt's status noted [name] is followed at [facility] currently. (above should have been noted and situation would have be resolved at that time ).  Per criteria for care by [facility] - pt would have to not been seen for 3 or more years at [facility] and/or have private insurance to become a patient under their services.  Pt needs to call [facility] for appointment ASAP for follow up.

## 2012-03-07 NOTE — Progress Notes (Signed)
This RN spoke with pt per her return call at 1430.  Per discussion plan is to obtain a doppler of Right leg to evaluate areas of tenderness.  Discussed above with pt and scheduled for 3/20 at 10 am at [facility].  [name] verbalized understanding of above back to this RN. She will also come to clinic to see this RN for evaluation of a "boil" under her arm.  This RN discussed with pt as well situation with the [facility] protocol and need for pt schedule an appt at [facility] ASAP to maintain primary medical care.  No other needs at this time.

## 2012-03-07 NOTE — Progress Notes (Signed)
Patient received one prescription from [place] [facility] pharmacy on 03/06/12 $4.51,her remaninig balance [facility] $213.28 and ALIGHT $600.00.

## 2012-03-07 NOTE — Telephone Encounter (Signed)
Pt left message requesting a return call ( states she called several times 3/18 ). Reena stated concern with appt at the [facility].  Per message Kirsti states she is still having swelling in her right leg and did restart the fluid pills but there are areas in the leg that are tender.  This RN returned call to Samanda and obtained verified VM- Message left for a return call ASAP per concern with leg.

## 2012-03-07 NOTE — Telephone Encounter (Signed)
Spoke with Vikki Ports with the [facility] regarding patient's referral to [facility].  Discussed the issue of patient not being able to sched an appt with [name] because she is an active patient at [facility].  Provider listed as her primary in Epic is no longer with [facility], but with [facility].  Hence, the reason for the confusion about where her medical home was.  Asked Ms. Dodd to have patient contact [facility] for an appt.  Coordinator agreed.

## 2012-03-07 NOTE — Telephone Encounter (Signed)
Called patient and informed of info from [place] at [facility].  Canceled appt with Dr. Earnest Bailey for 03/08/12 and patient informed to keep appt with [facility] for 03/28/12 @ 10:30.  Patient verbalized understanding.  Gaylene Brooks, RN

## 2012-03-08 ENCOUNTER — Ambulatory Visit (HOSPITAL_COMMUNITY): Payer: Medicaid Other | Attending: Oncology

## 2012-03-08 ENCOUNTER — Ambulatory Visit: Payer: Self-pay | Admitting: Family Medicine

## 2012-03-09 ENCOUNTER — Other Ambulatory Visit (HOSPITAL_COMMUNITY): Payer: Self-pay

## 2012-03-09 ENCOUNTER — Telehealth: Payer: Self-pay | Admitting: *Deleted

## 2012-03-09 NOTE — Telephone Encounter (Signed)
Pt left message late yesturday pm stating she needed to reschedule FTKA for doppler due to death in family.  This RN called this am and was able to obtain appt for 10am on 3/22 at [facility].  Called and discussed with pt.

## 2012-03-10 ENCOUNTER — Encounter (INDEPENDENT_AMBULATORY_CARE_PROVIDER_SITE_OTHER): Payer: Self-pay | Admitting: General Surgery

## 2012-03-10 ENCOUNTER — Ambulatory Visit (HOSPITAL_COMMUNITY)
Admission: RE | Admit: 2012-03-10 | Discharge: 2012-03-10 | Disposition: A | Discharge status: Home / Self Care | Payer: Medicaid Other | Source: Ambulatory Visit | Attending: Oncology | Admitting: Oncology

## 2012-03-10 ENCOUNTER — Encounter: Payer: Self-pay | Admitting: *Deleted

## 2012-03-10 ENCOUNTER — Ambulatory Visit (INDEPENDENT_AMBULATORY_CARE_PROVIDER_SITE_OTHER): Payer: Self-pay | Admitting: General Surgery

## 2012-03-10 ENCOUNTER — Other Ambulatory Visit (INDEPENDENT_AMBULATORY_CARE_PROVIDER_SITE_OTHER): Payer: Self-pay | Admitting: General Surgery

## 2012-03-10 ENCOUNTER — Telehealth: Payer: Self-pay | Admitting: *Deleted

## 2012-03-10 VITALS — BP 144/80 | HR 70 | Temp 97.9°F | Resp 18 | Ht 65.0 in | Wt 233.2 lb

## 2012-03-10 DIAGNOSIS — M79609 Pain in unspecified limb: Secondary | ICD-10-CM | POA: Insufficient documentation

## 2012-03-10 DIAGNOSIS — IMO0002 Reserved for concepts with insufficient information to code with codable children: Secondary | ICD-10-CM

## 2012-03-10 DIAGNOSIS — R599 Enlarged lymph nodes, unspecified: Secondary | ICD-10-CM | POA: Insufficient documentation

## 2012-03-10 DIAGNOSIS — C50919 Malignant neoplasm of unspecified site of unspecified female breast: Secondary | ICD-10-CM

## 2012-03-10 DIAGNOSIS — L02411 Cutaneous abscess of right axilla: Secondary | ICD-10-CM

## 2012-03-10 NOTE — Progress Notes (Signed)
Pt came over to this office post doppler study of R leg with negative reading.  Noted on R calf area of redness with increased warmth.  [place] also showed this RN boil under R arm ( axillary ) with drainage of yellow pus. Not odorous to this RN but pt states " I can smell it and it's awful". Countess states she feels a second boil coming up next to it.  Pt also states overall "pain.. In all my joints ".  Above reviewed with MD who recommended pt to see surgeon ASAP. If not today she should start doxycycline 100mg  bid x 7 days then daily for 3 weeks. Refill given for percocet to be used 1 tab bid due to recent increased liver enzymes. This RN called to Dr Johna Sheriff and made appt for today at 130pm.  All the above discussed with pt and given to her in writing.

## 2012-03-10 NOTE — Progress Notes (Signed)
Addended by: Maryan Puls on: 03/10/2012 02:33 PM   Modules accepted: Orders

## 2012-03-10 NOTE — Progress Notes (Signed)
Right lower extremity venous duplex completed.  Preliminary report is negative for DVT, SVT, or a Baker's cyst in the right leg.  Negative for DVT in the left common femoral vein. 

## 2012-03-10 NOTE — Telephone Encounter (Signed)
Previous note documented

## 2012-03-10 NOTE — Progress Notes (Signed)
Chief complaint: Pain and drainage right axilla  History: Patient comes into the office as an urgent work in today. She is of course status post bilateral mastectomy for breast cancer and undergoing chemotherapy at the cancer center. At her cancer center visit today she was found to have an area of drainage and swelling in the right axilla. This is been hurting her for several days. She has not had any previous history of anything similar.  Exam: BP 144/80  Pulse 70  Temp(Src) 97.9 F (36.6 C) (Temporal)  Resp 18  Ht 5\' 5"  (1.651 m)  Wt 233 lb 3.2 oz (105.779 kg)  BMI 38.81 kg/m2  Gen.: Alert and in no distress Breasts: Both mastectomy sites are well healed without infection or fluid. Port site looks fine. Axilla: In the right axilla well above the mastectomy incision is a several centimeter area of swelling and tenderness and purulent drainage.  Assessment and plan: Right axillary abscess.  Procedure: After explaining the procedure under local anesthesia I unroofed this area and removed a large amount of ruptured sebaceous cyst wall and purulent material. The cavity was cleaned and packed with gauze. She has a prescription for doxycycline and Percocet. Cultures were obtained. Wound care was explained and she will change the dressing daily and return to office if this does not progressively heal.  I will see her back in 4 months for cancer followup.

## 2012-03-13 ENCOUNTER — Encounter: Payer: Self-pay | Admitting: Oncology

## 2012-03-13 ENCOUNTER — Encounter: Payer: Self-pay | Admitting: Physician Assistant

## 2012-03-13 ENCOUNTER — Ambulatory Visit: Payer: Self-pay

## 2012-03-13 ENCOUNTER — Other Ambulatory Visit: Payer: Self-pay | Admitting: Physician Assistant

## 2012-03-13 ENCOUNTER — Ambulatory Visit (HOSPITAL_BASED_OUTPATIENT_CLINIC_OR_DEPARTMENT_OTHER): Payer: Self-pay | Admitting: Physician Assistant

## 2012-03-13 ENCOUNTER — Other Ambulatory Visit: Payer: Self-pay | Admitting: Lab

## 2012-03-13 ENCOUNTER — Ambulatory Visit (HOSPITAL_BASED_OUTPATIENT_CLINIC_OR_DEPARTMENT_OTHER): Payer: Self-pay

## 2012-03-13 ENCOUNTER — Telehealth: Payer: Self-pay | Admitting: Oncology

## 2012-03-13 VITALS — BP 158/104 | HR 109 | Temp 98.6°F | Ht 65.0 in | Wt 233.9 lb

## 2012-03-13 DIAGNOSIS — C50919 Malignant neoplasm of unspecified site of unspecified female breast: Secondary | ICD-10-CM

## 2012-03-13 DIAGNOSIS — Z5111 Encounter for antineoplastic chemotherapy: Secondary | ICD-10-CM

## 2012-03-13 LAB — CBC WITH DIFFERENTIAL/PLATELET
BASO%: 0.5 % (ref 0.0–2.0)
LYMPH%: 7.6 % — ABNORMAL LOW (ref 14.0–49.7)
MCHC: 33.1 g/dL (ref 31.5–36.0)
MCV: 88.5 fL (ref 79.5–101.0)
MONO%: 9.1 % (ref 0.0–14.0)
Platelets: 348 10*3/uL (ref 145–400)
RBC: 3.58 10*6/uL — ABNORMAL LOW (ref 3.70–5.45)
WBC: 21.2 10*3/uL — ABNORMAL HIGH (ref 3.9–10.3)
nRBC: 1 % — ABNORMAL HIGH (ref 0–0)

## 2012-03-13 LAB — COMPREHENSIVE METABOLIC PANEL
ALT: 50 U/L — ABNORMAL HIGH (ref 0–35)
AST: 48 U/L — ABNORMAL HIGH (ref 0–37)
Alkaline Phosphatase: 127 U/L — ABNORMAL HIGH (ref 39–117)
Creatinine, Ser: 1.07 mg/dL (ref 0.50–1.10)
Total Bilirubin: 0.3 mg/dL (ref 0.3–1.2)

## 2012-03-13 MED ORDER — ACETAMINOPHEN 325 MG PO TABS: 650.0000 mg | ORAL_TABLET | Freq: Once | ORAL | Status: DC

## 2012-03-13 MED ORDER — TRASTUZUMAB CHEMO INJECTION 440 MG: 6.0000 mg/kg | Freq: Once | INTRAVENOUS | Status: DC

## 2012-03-13 MED ORDER — DIPHENHYDRAMINE HCL 25 MG PO CAPS: 25.0000 mg | ORAL_CAPSULE | Freq: Once | ORAL | Status: DC

## 2012-03-13 MED ORDER — HEPARIN SOD (PORK) LOCK FLUSH 100 UNIT/ML IV SOLN
500.0000 [IU] | Freq: Once | INTRAVENOUS | Status: AC | PRN
  Administered 2012-03-13: 500 [IU]
  Filled 2012-03-13: qty 5

## 2012-03-13 MED ORDER — SODIUM CHLORIDE 0.9 % IV SOLN
800.0000 mg/m2 | Freq: Once | INTRAVENOUS | Status: AC
  Administered 2012-03-13: 1748 mg via INTRAVENOUS
  Filled 2012-03-13: qty 46

## 2012-03-13 MED ORDER — ONDANSETRON 16 MG/50ML IVPB (CHCC)
16.0000 mg | Freq: Once | INTRAVENOUS | Status: AC
  Administered 2012-03-13: 16 mg via INTRAVENOUS

## 2012-03-13 MED ORDER — SODIUM CHLORIDE 0.9 % IV SOLN
750.0000 mg | Freq: Once | INTRAVENOUS | Status: AC
  Administered 2012-03-13: 750 mg via INTRAVENOUS
  Filled 2012-03-13: qty 75

## 2012-03-13 MED ORDER — DEXAMETHASONE SODIUM PHOSPHATE 4 MG/ML IJ SOLN
20.0000 mg | Freq: Once | INTRAMUSCULAR | Status: AC
  Administered 2012-03-13: 20 mg via INTRAVENOUS

## 2012-03-13 MED ORDER — SODIUM CHLORIDE 0.9 % IV SOLN
Freq: Once | INTRAVENOUS | Status: AC
  Administered 2012-03-13: 12:00:00 via INTRAVENOUS

## 2012-03-13 MED ORDER — SODIUM CHLORIDE 0.9 % IJ SOLN
10.0000 mL | INTRAMUSCULAR | Status: DC | PRN
  Administered 2012-03-13: 10 mL
  Filled 2012-03-13: qty 10

## 2012-03-13 NOTE — Telephone Encounter (Signed)
gve the pt her appt with dr Dayton Scrape in april

## 2012-03-13 NOTE — Progress Notes (Signed)
Patient received two prescriptions from [place] op pharmacy on 03/13/12 $16.44,her remaninig balance [facility] $196.84 and ALIGHT $600.00.

## 2012-03-13 NOTE — Progress Notes (Signed)
Patient stop by today needing a gas card,I told her we have already given her two, she said she needed help with transportation,so I gave her the number to [facility],she said she would call them.
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
## 2012-03-14 ENCOUNTER — Other Ambulatory Visit: Payer: Self-pay | Admitting: *Deleted

## 2012-03-14 DIAGNOSIS — E876 Hypokalemia: Secondary | ICD-10-CM

## 2012-03-14 LAB — CULTURE, ROUTINE-ABSCESS

## 2012-03-14 MED ORDER — POTASSIUM CHLORIDE CRYS ER 20 MEQ PO TBCR: 20.0000 meq | EXTENDED_RELEASE_TABLET | Freq: Three times a day (TID) | ORAL | Status: DC

## 2012-03-14 NOTE — Telephone Encounter (Signed)
Called and left the patient a message to call me

## 2012-03-16 ENCOUNTER — Encounter (HOSPITAL_COMMUNITY): Payer: Self-pay | Admitting: *Deleted

## 2012-03-16 ENCOUNTER — Emergency Department (HOSPITAL_COMMUNITY)
Admission: EM | Admit: 2012-03-16 | Discharge: 2012-03-16 | Disposition: A | Discharge status: Home / Self Care | Payer: Medicaid Other | Attending: Emergency Medicine | Admitting: Emergency Medicine

## 2012-03-16 ENCOUNTER — Ambulatory Visit: Payer: Self-pay

## 2012-03-16 ENCOUNTER — Institutional Professional Consult (permissible substitution): Payer: Self-pay | Admitting: Radiation Oncology

## 2012-03-16 DIAGNOSIS — Z9861 Coronary angioplasty status: Secondary | ICD-10-CM | POA: Insufficient documentation

## 2012-03-16 DIAGNOSIS — Z853 Personal history of malignant neoplasm of breast: Secondary | ICD-10-CM | POA: Insufficient documentation

## 2012-03-16 DIAGNOSIS — I2510 Atherosclerotic heart disease of native coronary artery without angina pectoris: Secondary | ICD-10-CM | POA: Insufficient documentation

## 2012-03-16 DIAGNOSIS — F17200 Nicotine dependence, unspecified, uncomplicated: Secondary | ICD-10-CM | POA: Insufficient documentation

## 2012-03-16 DIAGNOSIS — E785 Hyperlipidemia, unspecified: Secondary | ICD-10-CM | POA: Insufficient documentation

## 2012-03-16 DIAGNOSIS — I10 Essential (primary) hypertension: Secondary | ICD-10-CM

## 2012-03-16 MED ORDER — VALSARTAN 320 MG PO TABS: 320.0000 mg | ORAL_TABLET | Freq: Every day | ORAL | Status: DC

## 2012-03-16 MED ORDER — METOPROLOL SUCCINATE ER 100 MG PO TB24: 100.0000 mg | ORAL_TABLET | Freq: Every day | ORAL | Status: DC

## 2012-03-16 MED ORDER — AMLODIPINE BESYLATE 5 MG PO TABS: 5.0000 mg | ORAL_TABLET | Freq: Every day | ORAL | Status: DC

## 2012-03-16 MED ORDER — ISOSORBIDE DINITRATE 30 MG PO TABS: 30.0000 mg | ORAL_TABLET | Freq: Two times a day (BID) | ORAL | Status: DC

## 2012-03-16 NOTE — Discharge Instructions (Signed)
Hypertension Information As your heart beats, it forces blood through your arteries. This force is your blood pressure. If the pressure is too high, it is called hypertension (HTN) or high blood pressure. HTN is dangerous because you may have it and not know it. High blood pressure may mean that your heart has to work harder to pump blood. Your arteries may be narrow or stiff. The extra work puts you at risk for heart disease, stroke, and other problems.  Blood pressure consists of two numbers, a higher number over a lower, 110/72, for example. It is stated as "110 over 72." The ideal is below 120 for the top number (systolic) and under 80 for the bottom (diastolic).  You should pay close attention to your blood pressure if you have certain conditions such as:  Heart failure.   Prior heart attack.   Diabetes   Chronic kidney disease.   Prior stroke.   Multiple risk factors for heart disease.  To see if you have HTN, your blood pressure should be measured while you are seated with your arm held at the level of the heart. It should be measured at least twice. A one-time elevated blood pressure reading (especially in the Emergency Department) does not mean that you need treatment. There may be conditions in which the blood pressure is different between your right and left arms. It is important to see your caregiver soon for a recheck. Most people have essential hypertension which means that there is not a specific cause. This type of high blood pressure may be lowered by changing lifestyle factors such as:  Stress.   Smoking.   Lack of exercise.   Excessive weight.   Drug/tobacco/alcohol use.   Eating less salt.  Most people do not have symptoms from high blood pressure until it has caused damage to the body. Effective treatment can often prevent, delay or reduce that damage. TREATMENT  Treatment for high blood pressure, when a cause has been identified, is directed at the cause. There  are a large number of medications to treat HTN. These fall into several categories, and your caregiver will help you select the medicines that are best for you. Medications may have side effects. You should review side effects with your caregiver. If your blood pressure stays high after you have made lifestyle changes or started on medicines,   Your medication(s) may need to be changed.   Other problems may need to be addressed.   Be certain you understand your prescriptions, and know how and when to take your medicine.   Be sure to follow up with your caregiver within the time frame advised (usually within two weeks) to have your blood pressure rechecked and to review your medications.   If you are taking more than one medicine to lower your blood pressure, make sure you know how and at what times they should be taken. Taking two medicines at the same time can result in blood pressure that is too low.  Document Released: 02/08/2006 Document Revised: 08/18/2011 Document Reviewed: 02/15/2008 [employer] Patient Information 2012 [employer]. 
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
## 2012-03-16 NOTE — ED Notes (Signed)
Pt reports she was sent here for [facility].  Pt reports elevated BP with h/a.  Pt reports ran out of her meds x 4 days.

## 2012-03-16 NOTE — ED Notes (Signed)
Pt assessed and discharged by Dr. Hosmer. 

## 2012-03-20 ENCOUNTER — Encounter: Payer: Self-pay | Admitting: *Deleted

## 2012-03-20 ENCOUNTER — Other Ambulatory Visit: Payer: Self-pay | Admitting: Lab

## 2012-03-20 ENCOUNTER — Other Ambulatory Visit: Payer: Self-pay | Admitting: Certified Registered Nurse Anesthetist

## 2012-03-20 ENCOUNTER — Encounter: Payer: Self-pay | Admitting: Oncology

## 2012-03-20 ENCOUNTER — Ambulatory Visit (HOSPITAL_BASED_OUTPATIENT_CLINIC_OR_DEPARTMENT_OTHER): Payer: Self-pay

## 2012-03-20 VITALS — BP 131/79 | HR 97 | Temp 98.8°F

## 2012-03-20 DIAGNOSIS — Z5111 Encounter for antineoplastic chemotherapy: Secondary | ICD-10-CM

## 2012-03-20 DIAGNOSIS — C50919 Malignant neoplasm of unspecified site of unspecified female breast: Secondary | ICD-10-CM

## 2012-03-20 LAB — COMPREHENSIVE METABOLIC PANEL
ALT: 39 U/L — ABNORMAL HIGH (ref 0–35)
AST: 43 U/L — ABNORMAL HIGH (ref 0–37)
Albumin: 3.5 g/dL (ref 3.5–5.2)
Alkaline Phosphatase: 101 U/L (ref 39–117)
Potassium: 2.7 mEq/L — CL (ref 3.5–5.3)
Sodium: 137 mEq/L (ref 135–145)
Total Bilirubin: 0.2 mg/dL — ABNORMAL LOW (ref 0.3–1.2)
Total Protein: 7.2 g/dL (ref 6.0–8.3)

## 2012-03-20 LAB — CBC WITH DIFFERENTIAL/PLATELET
BASO%: 0.4 % (ref 0.0–2.0)
EOS%: 0.9 % (ref 0.0–7.0)
HGB: 9.7 g/dL — ABNORMAL LOW (ref 11.6–15.9)
MCH: 30.6 pg (ref 25.1–34.0)
MCV: 89.9 fL (ref 79.5–101.0)
MONO%: 11.4 % (ref 0.0–14.0)
RBC: 3.17 10*6/uL — ABNORMAL LOW (ref 3.70–5.45)
RDW: 20.2 % — ABNORMAL HIGH (ref 11.2–14.5)
lymph#: 1 10*3/uL (ref 0.9–3.3)
nRBC: 0 % (ref 0–0)

## 2012-03-20 MED ORDER — PROCHLORPERAZINE MALEATE 10 MG PO TABS
10.0000 mg | ORAL_TABLET | Freq: Once | ORAL | Status: AC
  Administered 2012-03-20: 10 mg via ORAL

## 2012-03-20 MED ORDER — SODIUM CHLORIDE 0.9 % IV SOLN
800.0000 mg/m2 | Freq: Once | INTRAVENOUS | Status: AC
  Administered 2012-03-20: 1748 mg via INTRAVENOUS
  Filled 2012-03-20: qty 46

## 2012-03-20 NOTE — Progress Notes (Unsigned)
SPOKE WITH PT REGARDING K+. PT REPORTS THAT SHE HAS "JUST STARTED TAKING HER K+" AND IS CURRENTLY TAKING IT TWICE DAILY. PER AB , PT IS TO TAKE K+ TID AND WILL RECHECK HER LABS 03/27/12

## 2012-03-20 NOTE — Progress Notes (Signed)
Patient received four prescriptions from [facility] on 03/16/12,her remaninig balance [facility] $152.08 and [facility] $600.00.

## 2012-03-21 ENCOUNTER — Ambulatory Visit: Payer: Self-pay

## 2012-03-21 ENCOUNTER — Encounter: Payer: Self-pay | Admitting: Oncology

## 2012-03-21 NOTE — Progress Notes (Signed)
Patient received two prescriptions from [facility] on 03/20/12 $8.23,her remaninig balance [facility] $143.85 and [facility] $600.00.

## 2012-03-22 ENCOUNTER — Other Ambulatory Visit (HOSPITAL_COMMUNITY): Payer: Self-pay

## 2012-03-22 ENCOUNTER — Telehealth: Payer: Self-pay | Admitting: *Deleted

## 2012-03-22 NOTE — Telephone Encounter (Signed)
Returned the patient's phone call from Monday. Patient needs to reschedule her missed injection appointment. Appointment rescheduled to tomorrow. Patient also needs to reschedule her echo, patient transferred to the dept.  JMW

## 2012-03-23 ENCOUNTER — Ambulatory Visit (HOSPITAL_BASED_OUTPATIENT_CLINIC_OR_DEPARTMENT_OTHER): Payer: Self-pay

## 2012-03-23 VITALS — BP 90/62 | HR 89 | Temp 98.1°F

## 2012-03-23 DIAGNOSIS — C50919 Malignant neoplasm of unspecified site of unspecified female breast: Secondary | ICD-10-CM

## 2012-03-23 MED ORDER — PEGFILGRASTIM INJECTION 6 MG/0.6ML
6.0000 mg | Freq: Once | SUBCUTANEOUS | Status: AC
  Administered 2012-03-23: 6 mg via SUBCUTANEOUS
  Filled 2012-03-23: qty 0.6

## 2012-03-24 ENCOUNTER — Ambulatory Visit (HOSPITAL_COMMUNITY): Payer: Self-pay

## 2012-03-24 ENCOUNTER — Encounter: Payer: Self-pay | Admitting: Radiation Oncology

## 2012-03-24 NOTE — Progress Notes (Signed)
51 year old female.  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 2 of 18 nodes positive and so T3N1, stage III. ER, PR, and HER 2 positive. Underwent bilateral mastectomy on 08/10/2011.   In the right breast there were multiple foci of invasive lobular carcinoma. Patient received chemotherapy consisting of docetaxel, carboplatin, and trastuzumab every three weeks for a total of six cycles. Trastuzumab to continue for one year.   Dr. Darnelle Catalan referred patient to Dr. Dayton Scrape for postmastectomy radiation  NKDA No indication of a pacemaker No hx of radiation therapy

## 2012-03-27 ENCOUNTER — Other Ambulatory Visit: Payer: Self-pay | Admitting: Oncology

## 2012-03-27 ENCOUNTER — Inpatient Hospital Stay (HOSPITAL_COMMUNITY): Payer: Medicaid Other

## 2012-03-27 ENCOUNTER — Inpatient Hospital Stay (HOSPITAL_COMMUNITY)
Admission: AD | Admit: 2012-03-27 | Discharge: 2012-03-31 | DRG: 812 | Disposition: A | Discharge status: Home / Self Care | Payer: Medicaid Other | Source: Ambulatory Visit | Attending: Oncology | Admitting: Oncology

## 2012-03-27 ENCOUNTER — Encounter (HOSPITAL_COMMUNITY): Payer: Self-pay | Admitting: *Deleted

## 2012-03-27 ENCOUNTER — Other Ambulatory Visit (HOSPITAL_BASED_OUTPATIENT_CLINIC_OR_DEPARTMENT_OTHER): Payer: Self-pay | Admitting: Lab

## 2012-03-27 ENCOUNTER — Encounter (HOSPITAL_BASED_OUTPATIENT_CLINIC_OR_DEPARTMENT_OTHER): Payer: Self-pay | Admitting: Oncology

## 2012-03-27 VITALS — BP 138/98 | HR 84 | Temp 101.2°F | Ht 65.0 in | Wt 239.6 lb

## 2012-03-27 DIAGNOSIS — R5081 Fever presenting with conditions classified elsewhere: Secondary | ICD-10-CM

## 2012-03-27 DIAGNOSIS — E785 Hyperlipidemia, unspecified: Secondary | ICD-10-CM

## 2012-03-27 DIAGNOSIS — E876 Hypokalemia: Secondary | ICD-10-CM

## 2012-03-27 DIAGNOSIS — D696 Thrombocytopenia, unspecified: Secondary | ICD-10-CM

## 2012-03-27 DIAGNOSIS — M1990 Unspecified osteoarthritis, unspecified site: Secondary | ICD-10-CM

## 2012-03-27 DIAGNOSIS — R5381 Other malaise: Secondary | ICD-10-CM | POA: Diagnosis present

## 2012-03-27 DIAGNOSIS — R509 Fever, unspecified: Secondary | ICD-10-CM | POA: Diagnosis present

## 2012-03-27 DIAGNOSIS — D649 Anemia, unspecified: Secondary | ICD-10-CM

## 2012-03-27 DIAGNOSIS — D709 Neutropenia, unspecified: Secondary | ICD-10-CM

## 2012-03-27 DIAGNOSIS — C50919 Malignant neoplasm of unspecified site of unspecified female breast: Secondary | ICD-10-CM

## 2012-03-27 DIAGNOSIS — D6959 Other secondary thrombocytopenia: Secondary | ICD-10-CM | POA: Diagnosis present

## 2012-03-27 DIAGNOSIS — G8929 Other chronic pain: Secondary | ICD-10-CM

## 2012-03-27 DIAGNOSIS — T451X5A Adverse effect of antineoplastic and immunosuppressive drugs, initial encounter: Secondary | ICD-10-CM | POA: Diagnosis present

## 2012-03-27 DIAGNOSIS — J9819 Other pulmonary collapse: Secondary | ICD-10-CM | POA: Diagnosis present

## 2012-03-27 DIAGNOSIS — I10 Essential (primary) hypertension: Secondary | ICD-10-CM

## 2012-03-27 DIAGNOSIS — D72829 Elevated white blood cell count, unspecified: Secondary | ICD-10-CM | POA: Diagnosis present

## 2012-03-27 DIAGNOSIS — D630 Anemia in neoplastic disease: Principal | ICD-10-CM | POA: Diagnosis present

## 2012-03-27 DIAGNOSIS — I2510 Atherosclerotic heart disease of native coronary artery without angina pectoris: Secondary | ICD-10-CM

## 2012-03-27 LAB — COMPREHENSIVE METABOLIC PANEL
AST: 60 U/L — ABNORMAL HIGH (ref 0–37)
Albumin: 3.2 g/dL — ABNORMAL LOW (ref 3.5–5.2)
BUN: 12 mg/dL (ref 6–23)
Calcium: 8.8 mg/dL (ref 8.4–10.5)
Chloride: 98 mEq/L (ref 96–112)
Potassium: 4.2 mEq/L (ref 3.5–5.3)
Sodium: 131 mEq/L — ABNORMAL LOW (ref 135–145)
Total Protein: 7 g/dL (ref 6.0–8.3)

## 2012-03-27 LAB — CBC WITH DIFFERENTIAL/PLATELET
BASO%: 0.2 % (ref 0.0–2.0)
Eosinophils Absolute: 0 10*3/uL (ref 0.0–0.5)
LYMPH%: 3.5 % — ABNORMAL LOW (ref 14.0–49.7)
MCH: 31.3 pg (ref 25.1–34.0)
MCHC: 32.9 g/dL (ref 31.5–36.0)
MCV: 95 fL (ref 79.5–101.0)
NEUT#: 44.3 10*3/uL — ABNORMAL HIGH (ref 1.5–6.5)
Platelets: 40 10*3/uL — ABNORMAL LOW (ref 145–400)
RBC: 2.75 10*6/uL — ABNORMAL LOW (ref 3.70–5.45)

## 2012-03-27 MED ORDER — AMLODIPINE BESYLATE 5 MG PO TABS
5.0000 mg | ORAL_TABLET | Freq: Every day | ORAL | Status: DC
  Administered 2012-03-27 – 2012-03-31 (×5): 5 mg via ORAL
  Filled 2012-03-27 (×6): qty 1

## 2012-03-27 MED ORDER — ACETAMINOPHEN 325 MG PO TABS
650.0000 mg | ORAL_TABLET | ORAL | Status: DC | PRN
  Administered 2012-03-30 – 2012-03-31 (×4): 650 mg via ORAL
  Filled 2012-03-27 (×5): qty 2

## 2012-03-27 MED ORDER — HYDROCORTISONE 2.5 % RE CREA: 1.0000 "application " | TOPICAL_CREAM | Freq: Two times a day (BID) | RECTAL | Status: DC | PRN

## 2012-03-27 MED ORDER — GUAIFENESIN-DM 100-10 MG/5ML PO SYRP: 10.0000 mL | ORAL_SOLUTION | ORAL | Status: DC | PRN

## 2012-03-27 MED ORDER — DOCUSATE SODIUM 100 MG PO CAPS
100.0000 mg | ORAL_CAPSULE | Freq: Two times a day (BID) | ORAL | Status: DC
  Administered 2012-03-27 – 2012-03-31 (×8): 100 mg via ORAL
  Filled 2012-03-27 (×11): qty 1

## 2012-03-27 MED ORDER — ONDANSETRON 4 MG PO TBDP
4.0000 mg | ORAL_TABLET | Freq: Three times a day (TID) | ORAL | Status: DC
  Filled 2012-03-27 (×11): qty 2

## 2012-03-27 MED ORDER — SODIUM CHLORIDE 0.9 % IV SOLN
500.0000 mg | Freq: Three times a day (TID) | INTRAVENOUS | Status: DC
  Administered 2012-03-28 – 2012-03-30 (×7): 500 mg via INTRAVENOUS
  Filled 2012-03-27 (×9): qty 500

## 2012-03-27 MED ORDER — ASPIRIN 325 MG PO TABS
325.0000 mg | ORAL_TABLET | Freq: Every day | ORAL | Status: DC
  Administered 2012-03-27 – 2012-03-31 (×5): 325 mg via ORAL
  Filled 2012-03-27 (×6): qty 1

## 2012-03-27 MED ORDER — SODIUM CHLORIDE 0.9 % IV SOLN
500.0000 mg | Freq: Three times a day (TID) | INTRAVENOUS | Status: DC
  Filled 2012-03-27: qty 500

## 2012-03-27 MED ORDER — VANCOMYCIN HCL 1000 MG IV SOLR
1500.0000 mg | INTRAVENOUS | Status: DC
  Administered 2012-03-28 – 2012-03-29 (×2): 1500 mg via INTRAVENOUS
  Filled 2012-03-27 (×3): qty 1500

## 2012-03-27 MED ORDER — ONDANSETRON HCL 4 MG PO TABS
4.0000 mg | ORAL_TABLET | Freq: Three times a day (TID) | ORAL | Status: DC
  Filled 2012-03-27 (×9): qty 2

## 2012-03-27 MED ORDER — CLOPIDOGREL BISULFATE 75 MG PO TABS
75.0000 mg | ORAL_TABLET | Freq: Every day | ORAL | Status: DC
  Administered 2012-03-28 – 2012-03-31 (×4): 75 mg via ORAL
  Filled 2012-03-27 (×5): qty 1

## 2012-03-27 MED ORDER — PROCHLORPERAZINE MALEATE 10 MG PO TABS: 10.0000 mg | ORAL_TABLET | Freq: Four times a day (QID) | ORAL | Status: DC | PRN

## 2012-03-27 MED ORDER — PROMETHAZINE HCL 25 MG PO TABS: 12.5000 mg | ORAL_TABLET | Freq: Four times a day (QID) | ORAL | Status: DC | PRN

## 2012-03-27 MED ORDER — SODIUM CHLORIDE 0.9 % IV SOLN: 500.0000 mg | Freq: Four times a day (QID) | INTRAVENOUS | Status: DC

## 2012-03-27 MED ORDER — GUAIFENESIN-DM 100-10 MG/5ML PO SYRP: 5.0000 mL | ORAL_SOLUTION | ORAL | Status: DC | PRN

## 2012-03-27 MED ORDER — ONDANSETRON 8 MG/NS 50 ML IVPB
8.0000 mg | Freq: Three times a day (TID) | INTRAVENOUS | Status: DC
  Filled 2012-03-27 (×9): qty 8

## 2012-03-27 MED ORDER — ENOXAPARIN SODIUM 40 MG/0.4ML ~~LOC~~ SOLN
40.0000 mg | SUBCUTANEOUS | Status: DC
  Administered 2012-03-27: 40 mg via SUBCUTANEOUS
  Filled 2012-03-27 (×2): qty 0.4

## 2012-03-27 MED ORDER — SODIUM CHLORIDE 0.9 % IV SOLN
500.0000 mg | Freq: Four times a day (QID) | INTRAVENOUS | Status: DC
  Administered 2012-03-27: 500 mg via INTRAVENOUS
  Filled 2012-03-27 (×3): qty 500

## 2012-03-27 MED ORDER — SODIUM CHLORIDE 0.9 % IV SOLN
500.0000 mg | Freq: Three times a day (TID) | INTRAVENOUS | Status: DC
  Administered 2012-03-27: 500 mg via INTRAVENOUS
  Filled 2012-03-27: qty 500

## 2012-03-27 MED ORDER — ISOSORBIDE DINITRATE 30 MG PO TABS
30.0000 mg | ORAL_TABLET | Freq: Two times a day (BID) | ORAL | Status: DC
  Administered 2012-03-27: 30 mg via ORAL
  Filled 2012-03-27 (×3): qty 1

## 2012-03-27 MED ORDER — ALUM & MAG HYDROXIDE-SIMETH 200-200-20 MG/5ML PO SUSP: 60.0000 mL | ORAL | Status: DC | PRN

## 2012-03-27 MED ORDER — OXYCODONE-ACETAMINOPHEN 5-325 MG PO TABS
1.0000 | ORAL_TABLET | ORAL | Status: DC | PRN
  Administered 2012-03-27 – 2012-03-29 (×6): 1 via ORAL
  Filled 2012-03-27 (×6): qty 1

## 2012-03-27 MED ORDER — POTASSIUM CHLORIDE IN NACL 20-0.9 MEQ/L-% IV SOLN
INTRAVENOUS | Status: DC
  Administered 2012-03-27: 1000 mL via INTRAVENOUS
  Administered 2012-03-28 – 2012-03-30 (×2): via INTRAVENOUS
  Filled 2012-03-27 (×5): qty 1000

## 2012-03-27 MED ORDER — POTASSIUM CHLORIDE CRYS ER 20 MEQ PO TBCR
20.0000 meq | EXTENDED_RELEASE_TABLET | Freq: Two times a day (BID) | ORAL | Status: DC
  Administered 2012-03-27 – 2012-03-31 (×8): 20 meq via ORAL
  Filled 2012-03-27 (×11): qty 1

## 2012-03-27 MED ORDER — SENNOSIDES-DOCUSATE SODIUM 8.6-50 MG PO TABS
2.0000 | ORAL_TABLET | Freq: Two times a day (BID) | ORAL | Status: DC | PRN
  Filled 2012-03-27: qty 2

## 2012-03-27 MED ORDER — ONDANSETRON HCL 4 MG PO TABS: 4.0000 mg | ORAL_TABLET | Freq: Three times a day (TID) | ORAL | Status: DC | PRN

## 2012-03-27 MED ORDER — ACETAMINOPHEN 325 MG PO TABS: 650.0000 mg | ORAL_TABLET | ORAL | Status: DC | PRN

## 2012-03-27 MED ORDER — SENNOSIDES-DOCUSATE SODIUM 8.6-50 MG PO TABS: 2.0000 | ORAL_TABLET | Freq: Two times a day (BID) | ORAL | Status: DC | PRN

## 2012-03-27 MED ORDER — METOPROLOL TARTRATE 100 MG PO TABS
100.0000 mg | ORAL_TABLET | Freq: Every morning | ORAL | Status: DC
  Administered 2012-03-28 – 2012-03-31 (×4): 100 mg via ORAL
  Filled 2012-03-27 (×5): qty 1

## 2012-03-27 MED ORDER — ADULT MULTIVITAMIN W/MINERALS CH
1.0000 | ORAL_TABLET | Freq: Every day | ORAL | Status: DC
  Administered 2012-03-27 – 2012-03-31 (×5): 1 via ORAL
  Filled 2012-03-27 (×6): qty 1

## 2012-03-27 MED ORDER — VANCOMYCIN HCL 1000 MG IV SOLR
2000.0000 mg | Freq: Once | INTRAVENOUS | Status: AC
  Administered 2012-03-27: 2000 mg via INTRAVENOUS
  Filled 2012-03-27: qty 2000

## 2012-03-27 MED ORDER — ALUM & MAG HYDROXIDE-SIMETH 200-200-20 MG/5ML PO SUSP: 30.0000 mL | Freq: Four times a day (QID) | ORAL | Status: DC | PRN

## 2012-03-27 MED ORDER — ONDANSETRON HCL 4 MG/2ML IJ SOLN
4.0000 mg | Freq: Three times a day (TID) | INTRAMUSCULAR | Status: DC | PRN
  Filled 2012-03-27: qty 2

## 2012-03-27 MED ORDER — ONDANSETRON HCL 4 MG/2ML IJ SOLN
4.0000 mg | Freq: Three times a day (TID) | INTRAMUSCULAR | Status: DC
  Administered 2012-03-27 – 2012-03-28 (×2): 4 mg via INTRAVENOUS
  Filled 2012-03-27: qty 2

## 2012-03-27 MED ORDER — PROCHLORPERAZINE EDISYLATE 5 MG/ML IJ SOLN: 10.0000 mg | Freq: Four times a day (QID) | INTRAMUSCULAR | Status: DC | PRN

## 2012-03-27 MED ORDER — ONDANSETRON 4 MG PO TBDP
4.0000 mg | ORAL_TABLET | Freq: Three times a day (TID) | ORAL | Status: DC | PRN
  Administered 2012-03-28: 4 mg via ORAL
  Administered 2012-03-28: 8 mg via ORAL
  Filled 2012-03-27: qty 2

## 2012-03-27 MED ORDER — VANCOMYCIN HCL 1000 MG IV SOLR
2000.0000 mg | Freq: Once | INTRAVENOUS | Status: DC
  Filled 2012-03-27: qty 2000

## 2012-03-27 MED ORDER — ONDANSETRON 8 MG/NS 50 ML IVPB
8.0000 mg | Freq: Three times a day (TID) | INTRAVENOUS | Status: DC | PRN
  Filled 2012-03-27: qty 8
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
## 2012-03-28 ENCOUNTER — Ambulatory Visit: Payer: Self-pay

## 2012-03-28 ENCOUNTER — Ambulatory Visit: Payer: Self-pay | Admitting: Radiation Oncology

## 2012-03-28 LAB — COMPREHENSIVE METABOLIC PANEL
ALT: 33 U/L (ref 0–35)
AST: 45 U/L — ABNORMAL HIGH (ref 0–37)
Albumin: 2.8 g/dL — ABNORMAL LOW (ref 3.5–5.2)
Alkaline Phosphatase: 158 U/L — ABNORMAL HIGH (ref 39–117)
BUN: 11 mg/dL (ref 6–23)
Chloride: 100 mEq/L (ref 96–112)
Potassium: 3.8 mEq/L (ref 3.5–5.1)
Sodium: 131 mEq/L — ABNORMAL LOW (ref 135–145)
Total Bilirubin: 0.3 mg/dL (ref 0.3–1.2)
Total Protein: 6.1 g/dL (ref 6.0–8.3)

## 2012-03-28 LAB — CBC
MCHC: 34.1 g/dL (ref 30.0–36.0)
Platelets: 25 10*3/uL — CL (ref 150–400)
RDW: 19.6 % — ABNORMAL HIGH (ref 11.5–15.5)
WBC: 47.4 10*3/uL — ABNORMAL HIGH (ref 4.0–10.5)

## 2012-03-28 LAB — PREPARE RBC (CROSSMATCH)

## 2012-03-28 LAB — DIFFERENTIAL
Basophils Relative: 0 % (ref 0–1)
Eosinophils Relative: 0 % (ref 0–5)
Monocytes Absolute: 1.9 10*3/uL — ABNORMAL HIGH (ref 0.1–1.0)
Neutro Abs: 37.9 10*3/uL — ABNORMAL HIGH (ref 1.7–7.7)
Neutrophils Relative %: 80 % — ABNORMAL HIGH (ref 43–77)

## 2012-03-28 LAB — URINALYSIS, ROUTINE W REFLEX MICROSCOPIC
Glucose, UA: NEGATIVE mg/dL
Hgb urine dipstick: NEGATIVE
pH: 6 (ref 5.0–8.0)

## 2012-03-28 LAB — MAGNESIUM: Magnesium: 1.5 mg/dL (ref 1.5–2.5)

## 2012-03-28 LAB — ABO/RH: ABO/RH(D): B POS

## 2012-03-28 LAB — URINE MICROSCOPIC-ADD ON

## 2012-03-28 LAB — PATHOLOGIST SMEAR REVIEW

## 2012-03-28 LAB — TSH: TSH: 3.626 u[IU]/mL (ref 0.350–4.500)

## 2012-03-28 MED ORDER — POLYETHYLENE GLYCOL 3350 17 G PO PACK
17.0000 g | PACK | Freq: Every day | ORAL | Status: DC
  Administered 2012-03-28: 17 g via ORAL
  Filled 2012-03-28 (×2): qty 1

## 2012-03-28 MED ORDER — DIPHENHYDRAMINE HCL 25 MG PO CAPS
25.0000 mg | ORAL_CAPSULE | Freq: Once | ORAL | Status: AC
  Administered 2012-03-28: 25 mg via ORAL
  Filled 2012-03-28: qty 1

## 2012-03-28 MED ORDER — ACETAMINOPHEN 325 MG PO TABS
650.0000 mg | ORAL_TABLET | Freq: Once | ORAL | Status: AC
  Administered 2012-03-28: 650 mg via ORAL

## 2012-03-28 MED ORDER — FUROSEMIDE 10 MG/ML IJ SOLN
10.0000 mg | Freq: Once | INTRAMUSCULAR | Status: AC
  Administered 2012-03-28: 10 mg via INTRAVENOUS
  Filled 2012-03-28: qty 1

## 2012-03-28 MED ORDER — ACETAMINOPHEN 325 MG PO TABS
650.0000 mg | ORAL_TABLET | Freq: Once | ORAL | Status: AC
  Administered 2012-03-28: 650 mg via ORAL
  Filled 2012-03-28: qty 2

## 2012-03-28 NOTE — Progress Notes (Signed)
03/28/12 0230 Dr. Mechele Collin (MD on call) notified of pt's temp of 102.5.  Orders received. Will cont to monitor pt. Sidney Ace

## 2012-03-28 NOTE — Progress Notes (Signed)
CRITICAL VALUE ALERT  Critical value received: 25  Date of notification:  03/28/12  Time of notification:  0506  Critical value read back:yes  Nurse who received alert:  GYates,RN  MD notified (1st page):  Dr. Mechele Collin Time of first page:  0508  MD notified (2nd page):  Time of second page:  Responding MD:  Dr. Mechele Collin  Time MD responded: [phone]

## 2012-03-28 NOTE — Progress Notes (Signed)
  Echocardiogram 2D Echocardiogram has been performed.  Cathie Beams Deneen 03/28/2012, 1:56 PM

## 2012-03-28 NOTE — Progress Notes (Signed)
[name]   DOB:21-Oct-1961   ZO#:[id number]   JWJ#:[id number]  Subjective: She feels "a little better;" still a lot of coughing; pain in legs as before; no BM x2 days; no overt bleeding   Objective:  Filed Vitals:   03/28/12 0624  BP: 103/62  Pulse: 111  Temp: 100.2 F (37.9 [name])  Resp: 20    Body mass index is 39.83 kg/(m^2).  Intake/Output Summary (Last 24 hours) at 03/28/12 0820 Last data filed at 03/28/12 6213  Gross per 24 hour  Intake    840 ml  Output    750 ml  Net     90 ml     Sclerae unicteric  Oropharynx clear  No peripheral adenopathy  Lungs clear -- no rales or wheezes  Heart regular rate and rhythm  Abdomen obese, benign  MSK no focal spinal tenderness, no peripheral edema, no tenderness over calves  Neuro nonfocal    CBG (last 3)  No results found for this basename: GLUCAP:3 in the last 72 hours   Labs:  Lab Results  Component Value Date   WBC 47.4* 03/28/2012   HGB 7.1* 03/28/2012   HCT 20.8* 03/28/2012   MCV 93.3 03/28/2012   PLT 25* 03/28/2012   NEUTROABS 37.9* 03/28/2012     Lab 03/28/12 0358 03/27/12 1530  NA 131* 131*  K 3.8 4.2  CL 100 98  CO2 20 22  GLUCOSE 121* 99  BUN 11 12  CREATININE 1.30* 1.31*  CALCIUM 8.5 8.8  MG 1.5 --    liver function tests  Urine Studies No results found for this basename: UACOL:2,UAPR:2,USPG:2,UPH:2,UTP:2,UGL:2,UKET:2,UBIL:2,UHGB:2,UNIT:2,UROB:2,ULEU:2,UEPI:2,UWBC:2,URBC:2,UBAC:2,CAST:2,CRYS:2,UCOM:2,BILUA:2 in the last 72 hours     Studies:  X-ray Chest Pa And Lateral   03/27/2012  *RADIOLOGY REPORT*  Clinical Data: Chest pain.  Fever.  Nausea.  Breast cancer.  CHEST - 2 VIEW  Comparison: 08/22/2011  Findings: Right-sided power port catheter noted with tip projecting over the SVC.  Mild cardiomegaly noted with linear subsegmental atelectasis or scar in the lingula mildly prominent epicardial adipose tissue noted.  Clips from prior left axillary dissection noted. Thoracic aortic tortuosity noted.  The  patient is rotated to the right on today's exam, resulting in reduced diagnostic sensitivity and specificity.  IMPRESSION:  1.  Mild cardiomegaly and mild prominent epicardial adipose tissue. 2.  Subsegmental atelectasis or scar in the lingula. 3.  Tortuous aorta.  Original Report Authenticated By: Dellia Cloud, M.D.    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  (2) On the right, there were multiple foci of invasive lobular carcinoma.  (3) Patient is status post 3 cycles of docetaxel, carboplatin, and trastuzumab, given every 3 weeks and discontinued in January due to peripheral neuropathy.  (4) Now receiving gemcitabine/carboplatin with trastuzumab, the gemcitabine given on days one and 8, the carboplatin and trastuzumab given on day 1 of each 21 day cycle. The plan is to complete 3 additional cycles of this combination.  (5) trastuzumab will be continued for a total of one year.  (6) Patient will also need postmastectomy radiation.  (7) she is now day 9 cycle 3 of her chemotherapy, and is being admitted for evaluation and management of febrile in an immunocompromised host (8) anemia requiring transfusion (  9) thrombocytopenia  Plan: I am going to hold her lovenox until platelets recover; will transfuse 2 units PRBCs today; she was due for an outpatient echo today, will do tomorrow (after Hb improved); will start to mobilize; hope for d/[name] 4/11 if cultures remain negative by then   [name] 03/28/2012

## 2012-03-28 NOTE — Progress Notes (Signed)
Nutrition Brief Noted  - Pt screened on admission and stated she had some difficulty chewing/swallowing foods and/or liquids. Met with pt who c/o sore throat which has prevented her from swallowing foods/liquids without pain, however this is improved, but pt could likely used a chloraseptic spray to help with this. Pt denies any difficulty chewing. Pt reports great appetite during admission and PTA, no changes in weight. Nursing reports pt consumed 100% of meals today. No nutrition diagnosis at this time, will continue to monitor intake.   Dietitian # [phone]

## 2012-03-28 NOTE — Progress Notes (Signed)
CARE MANAGEMENT NOTE 03/28/2012  Patient:  [name], [name]   Account Number:  [id number]  Date Initiated:  03/28/2012  Documentation initiated by:  Trang Bouse  Subjective/Objective Assessment:   51 yo female admitted 03/27/12 with fever     Action/Plan:   D/C when medically stable   Anticipated DC Date:  03/31/2012   Anticipated DC Plan:  HOME/SELF CARE      DC Planning Services  CM consult              Status of service:  Completed, signed off  Discharge Disposition:  HOME/SELF CARE   Comments:  03/28/12, Kathi Der RNC-MNN, BSN, [phone]   CM received referral from admissions nurse for trouble affording meds.  CM received phone call from CSW stating pt qualifies for financial assistance from hospital and is currently receiving medications from outpatient pharmacy at [facility].  CM spoke with pt concerning referral and she stated she is currently receiving her medications from the outpatient pharmacy and doesn't have any needs or problems getting medications.  CM instructed pt to let us know if something changes and she needs to speak with [name] again.

## 2012-03-29 ENCOUNTER — Ambulatory Visit (HOSPITAL_COMMUNITY): Payer: Medicaid Other | Attending: Physician Assistant

## 2012-03-29 LAB — COMPREHENSIVE METABOLIC PANEL
ALT: 27 U/L (ref 0–35)
AST: 47 U/L — ABNORMAL HIGH (ref 0–37)
Alkaline Phosphatase: 174 U/L — ABNORMAL HIGH (ref 39–117)
CO2: 22 mEq/L (ref 19–32)
Calcium: 8.1 mg/dL — ABNORMAL LOW (ref 8.4–10.5)
GFR calc non Af Amer: 58 mL/min — ABNORMAL LOW (ref >90)
Potassium: 3.4 mEq/L — ABNORMAL LOW (ref 3.5–5.1)
Sodium: 130 mEq/L — ABNORMAL LOW (ref 135–145)
Total Protein: 6.1 g/dL (ref 6.0–8.3)

## 2012-03-29 LAB — DIFFERENTIAL
Basophils Absolute: 0 10*3/uL (ref 0.0–0.1)
Basophils Relative: 0 % (ref 0–1)
Blasts: 0 %
Lymphocytes Relative: 5 % — ABNORMAL LOW (ref 12–46)
Lymphs Abs: 2.5 10*3/uL (ref 0.7–4.0)
Myelocytes: 0 %
Neutro Abs: 46.5 10*3/uL — ABNORMAL HIGH (ref 1.7–7.7)
Neutrophils Relative %: 92 % — ABNORMAL HIGH (ref 43–77)
Promyelocytes Absolute: 0 %

## 2012-03-29 LAB — URINE CULTURE: Culture  Setup Time: 201304090846

## 2012-03-29 LAB — TYPE AND SCREEN
Unit division: 0
Unit division: 0

## 2012-03-29 LAB — CBC
Hemoglobin: 9.4 g/dL — ABNORMAL LOW (ref 12.0–15.0)
MCH: 31.1 pg (ref 26.0–34.0)
MCHC: 35.1 g/dL (ref 30.0–36.0)
Platelets: 23 10*3/uL — CL (ref 150–400)
RDW: 18.9 % — ABNORMAL HIGH (ref 11.5–15.5)

## 2012-03-29 LAB — TRANSFUSION REACTION
DAT C3: NEGATIVE
Post RXN DAT IgG: NEGATIVE

## 2012-03-29 MED ORDER — GABAPENTIN 100 MG PO CAPS
100.0000 mg | ORAL_CAPSULE | Freq: Three times a day (TID) | ORAL | Status: DC | PRN
  Administered 2012-03-29: 100 mg via ORAL
  Filled 2012-03-29: qty 1

## 2012-03-29 MED ORDER — POLYETHYLENE GLYCOL 3350 17 G PO PACK
17.0000 g | PACK | Freq: Every day | ORAL | Status: DC
  Administered 2012-03-29 – 2012-03-31 (×3): 17 g via ORAL
  Filled 2012-03-29 (×4): qty 1

## 2012-03-29 MED ORDER — ISOSORBIDE DINITRATE 30 MG PO TABS
30.0000 mg | ORAL_TABLET | Freq: Three times a day (TID) | ORAL | Status: DC
  Administered 2012-03-29 – 2012-03-31 (×8): 30 mg via ORAL
  Filled 2012-03-29 (×13): qty 1

## 2012-03-29 MED ORDER — TRIAMTERENE-HCTZ 37.5-25 MG PO TABS
1.0000 | ORAL_TABLET | Freq: Every day | ORAL | Status: DC
  Administered 2012-03-29 – 2012-03-31 (×3): 1 via ORAL
  Filled 2012-03-29 (×4): qty 1

## 2012-03-29 MED ORDER — OXYCODONE HCL 5 MG PO TABS
5.0000 mg | ORAL_TABLET | Freq: Every evening | ORAL | Status: DC | PRN
  Administered 2012-03-29: 5 mg via ORAL
  Filled 2012-03-29: qty 1

## 2012-03-29 MED ORDER — DEXTROMETHORPHAN-GUAIFENESIN 10-100 MG/5ML PO LIQD
5.0000 mL | Freq: Four times a day (QID) | ORAL | Status: DC | PRN
  Filled 2012-03-29: qty 5

## 2012-03-29 MED ORDER — VITAMINS A & D EX OINT
TOPICAL_OINTMENT | CUTANEOUS | Status: AC
  Administered 2012-03-29: 10
  Filled 2012-03-29: qty 10

## 2012-03-29 NOTE — Progress Notes (Signed)
CRITICAL VALUE ALERT  Critical value received: WBC 50.5 Platelets: 23  Date of notification: 03/29/12  Time of notification:  0840  Critical value read back:yes  Nurse who received alert:  Jaci Standard  MD notified (1st page):  Dr. Darnelle Catalan  Time of first page:  0845  MD notified (2nd page): Dr. Darnelle Catalan  Time of second page:  0930  Responding MD:  Attempted to page a third time, with no response. Called physician's nurse and left voicemail regarding results. Still waiting on callback.   Time MD responded:

## 2012-03-29 NOTE — Progress Notes (Signed)
Pt spiked a temperature of 100.75F (37.8C) which was 1 degree C higher than the baseline temperature of 98.84F (36.8C).  Alerted Blood Bank and initiated transfusion reaction protocol. Notified MD on call and received no new orders.  Will continue to monitor pt. Daphene Calamity [facility] 03/29/2012 1:41 AM

## 2012-03-29 NOTE — Progress Notes (Signed)
[name]   DOB:1961-01-07   ZO#:[id number]   JWJ#:[id number]  Subjective: .Tolerated her transfusion without complications--not aware her temperature went up slightly. Did not get OOB much yesterday--"but will today." No BM since admission. Worried about BP medications. Feels a little dizzy, still has pain in her legs, keeps a cough. Not sure she'll be ready for d/c tomorrow--shooting for friday  Objective: [name] sitting up in bed eating breakfast Filed Vitals:   03/29/12 0558  BP: 145/93  Pulse: 111  Temp: 99.5 F (37.5 C)  Resp: 20    Body mass index is 39.83 kg/(m^2).  Intake/Output Summary (Last 24 hours) at 03/29/12 0738 Last data filed at 03/29/12 0559  Gross per 24 hour  Intake 4528.17 ml  Output   1600 ml  Net 2928.17 ml     Sclerae unicteric  Oropharynx clear  No peripheral adenopathy  Lungs clear -- no rales or wheezes  Heart regular rate and rhythm  Abdomen obese, benign  MSK no focal spinal tenderness, no peripheral edema, no tenderness over calves by palpation  Neuro nonfocal, A&OX3    CBG (last 3)  No results found for this basename: GLUCAP:3 in the last 72 hours   Labs:  Lab Results  Component Value Date   WBC 47.4* 03/28/2012   HGB 7.1* 03/28/2012   HCT 20.8* 03/28/2012   MCV 93.3 03/28/2012   PLT 25* 03/28/2012   NEUTROABS 37.9* 03/28/2012     Lab 03/28/12 0358 03/27/12 1530  NA 131* 131*  K 3.8 4.2  CL 100 98  CO2 20 22  GLUCOSE 121* 99  BUN 11 12  CREATININE 1.30* 1.31*  CALCIUM 8.5 8.8  MG 1.5 --    Urine Studies No results found for this basename: UACOL:2,UAPR:2,USPG:2,UPH:2,UTP:2,UGL:2,UKET:2,UBIL:2,UHGB:2,UNIT:2,UROB:2,ULEU:2,UEPI:2,UWBC:2,URBC:2,UBAC:2,CAST:2,CRYS:2,UCOM:2,BILUA:2 in the last 72 hours     Studies:  X-ray Chest Pa And Lateral   03/27/2012  *RADIOLOGY REPORT*  Clinical Data: Chest pain.  Fever.  Nausea.  Breast cancer.  CHEST - 2 VIEW  Comparison: 08/22/2011  Findings: Right-sided power  port catheter noted with tip projecting over the SVC.  Mild cardiomegaly noted with linear subsegmental atelectasis or scar in the lingula mildly prominent epicardial adipose tissue noted.  Clips from prior left axillary dissection noted. Thoracic aortic tortuosity noted.  The patient is rotated to the right on today's exam, resulting in reduced diagnostic sensitivity and specificity.  IMPRESSION:  1.  Mild cardiomegaly and mild prominent epicardial adipose tissue. 2.  Subsegmental atelectasis or scar in the lingula. 3.  Tortuous aorta.  Original Report Authenticated By: [name], M.D.    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  (2) On the right, there were multiple foci of invasive lobular carcinoma.  (3) Patient is status post 3 cycles of docetaxel, carboplatin, and trastuzumab, given every 3 weeks and discontinued in January due to peripheral neuropathy.  (4) Now receiving gemcitabine/carboplatin with trastuzumab, the gemcitabine given on days one and 8, the carboplatin and trastuzumab given on day 1 of each 21 day cycle. The plan is to complete 3 additional cycles of this combination.  (5) trastuzumab will be continued for a total of one year.  (6) Patient will also need postmastectomy radiation.  (7) she is  now day 9 cycle 3 of her chemotherapy, and is being admitted for evaluation and management of febrile in an immunocompromised host (8) anemia requiring transfusion (9) thrombocytopenia (10) HTN (11) DVT prophylaxis  Plan: not yet afebrile/24h; cultures negative to date; tolerating antibiotics w/o complications; BP back to baseline (high) and will resume isosorbide today; will check labs and consider resuming lovenox; encouraged ambulation;  hoping for discharge 24-48 hours   Jannett Schmall C 03/29/2012

## 2012-03-30 ENCOUNTER — Inpatient Hospital Stay (HOSPITAL_COMMUNITY): Payer: Medicaid Other

## 2012-03-30 LAB — COMPREHENSIVE METABOLIC PANEL
ALT: 23 U/L (ref 0–35)
AST: 46 U/L — ABNORMAL HIGH (ref 0–37)
Albumin: 2.8 g/dL — ABNORMAL LOW (ref 3.5–5.2)
Calcium: 8.5 mg/dL (ref 8.4–10.5)
Glucose, Bld: 107 mg/dL — ABNORMAL HIGH (ref 70–99)
Sodium: 131 mEq/L — ABNORMAL LOW (ref 135–145)
Total Protein: 6.2 g/dL (ref 6.0–8.3)

## 2012-03-30 LAB — DIFFERENTIAL
Band Neutrophils: 0 % (ref 0–10)
Basophils Absolute: 0 10*3/uL (ref 0.0–0.1)
Basophils Relative: 0 % (ref 0–1)
Blasts: 0 %
Myelocytes: 0 %
Promyelocytes Absolute: 0 %

## 2012-03-30 LAB — CBC
HCT: 26.3 % — ABNORMAL LOW (ref 36.0–46.0)
Hemoglobin: 9.2 g/dL — ABNORMAL LOW (ref 12.0–15.0)
MCH: 31.5 pg (ref 26.0–34.0)
MCHC: 35 g/dL (ref 30.0–36.0)
RDW: 19 % — ABNORMAL HIGH (ref 11.5–15.5)

## 2012-03-30 MED ORDER — TRAMADOL HCL 50 MG PO TABS: 50.0000 mg | ORAL_TABLET | Freq: Four times a day (QID) | ORAL | Status: DC | PRN

## 2012-03-30 MED ORDER — GABAPENTIN 100 MG PO CAPS
100.0000 mg | ORAL_CAPSULE | Freq: Three times a day (TID) | ORAL | Status: DC
  Administered 2012-03-30 – 2012-03-31 (×5): 100 mg via ORAL
  Filled 2012-03-30 (×9): qty 1

## 2012-03-30 MED ORDER — IMIPENEM-CILASTATIN 500 MG IV SOLR
500.0000 mg | Freq: Four times a day (QID) | INTRAVENOUS | Status: DC
  Administered 2012-03-30 – 2012-03-31 (×6): 500 mg via INTRAVENOUS
  Filled 2012-03-30 (×9): qty 500

## 2012-03-30 NOTE — Progress Notes (Signed)
PT Cancellation Note  Treatment cancelled today due to patient receiving procedure or test Per RN, pt is in xray at this time.  She has walked in the hall with nursing earlier today. Will ask nursing to walk pt again tonight and PT will check in with pt in the AM  Donnetta Hail 03/30/2012, 4:56 PM

## 2012-03-30 NOTE — Progress Notes (Signed)
[name]   DOB:04-25-1961   ZO#:[id number]   JWJ#:[id number]  Subjective: Still dizzy and SOB with ambulation, but "it gets better as I keep going;" had a "good" BM yesterday; less cough; eating OK. Pain in legs not significantly changed despite change in pain meds  Objective: [name] examined sitting up in bed Filed Vitals:   03/29/12 2130  BP: 125/71  Pulse: 93  Temp: 99.8 F (37.7 C)  Resp: 20    Body mass index is 39.83 kg/(m^2).  Intake/Output Summary (Last 24 hours) at 03/30/12 0729 Last data filed at 03/30/12 0553  Gross per 24 hour  Intake 2958.34 ml  Output    401 ml  Net 2557.34 ml  Pulse OX at rest >92%, drops to 89% per RN with activity   Sclerae unicteric  Oropharynx clear  No peripheral adenopathy  Lungs clear -- no rales or wheezes  Heart regular rate and rhythm  Abdomen obese, benign, +BS  MSK no focal spinal tenderness, no peripheral edema; the area that hurts her is in the lower R leg a few cm above the ankle; there is a slight discoloration over this area suggesting old trauma, but there is nothing palpable  Neuro nonfocal, A&OX3    CBG (last 3)  No results found for this basename: GLUCAP:3 in the last 72 hours   Labs:  Lab Results  Component Value Date   WBC 55.1* 03/30/2012   HGB 9.2* 03/30/2012   HCT 26.3* 03/30/2012   MCV 90.1 03/30/2012   PLT 25* 03/30/2012   NEUTROABS 48.5* 03/30/2012     Lab 03/30/12 0445 03/29/12 0825 03/28/12 0358 03/27/12 1530  NA 131* 130* 131* 131*  K 3.5 3.4* 3.8 4.2  CL 99 98 100 98  CO2 21 22 20 22   GLUCOSE 107* 113* 121* 99  BUN 7 8 11 12   CREATININE 0.99 1.11* 1.30* 1.31*  CALCIUM 8.5 8.1* 8.5 8.8  MG -- -- 1.5 --    Urine Studies No results found for this basename: UACOL:2,UAPR:2,USPG:2,UPH:2,UTP:2,UGL:2,UKET:2,UBIL:2,UHGB:2,UNIT:2,UROB:2,ULEU:2,UEPI:2,UWBC:2,URBC:2,UBAC:2,CAST:2,CRYS:2,UCOM:2,BILUA:2 in the last 72 hours     Studies:  Patient: Savannah, [name] MR #:  [id number] Study Date: 03/28/2012 Gender: F Age: 51 Height: 165.1cm Weight: 108.6kg BSA: 2.58m^2 Pt. Status: Room: 1308  PERFORMING [facility] ADMITTING Pilot Prindle, [place] ATTENDING Shawntae Lowy, [place] Duck ORDERING Analissa Bayless, [place] SONOGRAPHER Cathie Beams, RCS cc:  ------------------------------------------------------------ LV EF: 60% - 65%  ------------------------------------------------------------ Indications: Previous study 10/05/2011. CHF - 428.0.  ------------------------------------------------------------ History: PMH: Breast cancer. Coronary artery disease. Risk factors: Hypertension. Dyslipidemia.  ------------------------------------------------------------ Study Conclusions  - Left ventricle: The cavity size was normal. There was moderate concentric hypertrophy. Systolic function was normal. The estimated ejection fraction was in the range of 60% to 65%. Wall motion was normal; there were no regional wall motion abnormalities. Doppler parameters are consistent with abnormal left ventricular relaxation (grade 1 diastolic dysfunction). - Pulmonary arteries: Systolic pressure was mildly increased. PA peak pressure: 42mm Hg (S). Transthoracic echocardiography. M-mode, complete 2D, spectral Doppler, and color Doppler. Height: Height: 165.1cm. Height: 65in. Weight: Weight: 108.6kg. Weight: 238.9lb. Body mass index: BMI: 39.8kg/m^2. Body surface area: BSA: 2.62m^2. Blood pressure: 103/62. Patient status: Inpatient. Location: Bedside.    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  (2) On the right, there were multiple foci of invasive lobular carcinoma.  (3) Patient is status post 3 cycles of  docetaxel, carboplatin, and trastuzumab, given every 3 weeks and discontinued in January due to peripheral neuropathy.  (4) Now receiving gemcitabine/carboplatin with trastuzumab, the gemcitabine given on days one and 8, the carboplatin and trastuzumab given on day 1 of each 21 day cycle. The plan is to complete 3 additional cycles of this combination.  (5) trastuzumab will be continued for a total of one year.  (6) Patient will also need postmastectomy radiation.  (7) she is now day 10 cycle 3 of her chemotherapy, and is being admitted for evaluation and management of fever in an immunocompromised host (8) anemia requiring transfusion (9) thrombocytopenia secondary to chemotherapy (10) HTN (11) DVT prophylaxis (12) leukocytosis s/p recent neulasta 4/4  Plan: cultures remain negative; I think her SOB is largely secondary to anemia and deconditioning, as she had similar Sxs with a negative CT-angio last Sept--will continue to ambulate, obtain CXR; not clear why she has the LE pain--it seems too high for neuropathy, and she had a negative doppler 03/10/2012; will continue to transition to neurontin from narcotics; if she continues to become more functional hope to d/c in AM; will ask PT to help [name] mobilize   [name] C 03/30/2012

## 2012-03-31 ENCOUNTER — Telehealth: Payer: Self-pay | Admitting: Oncology

## 2012-03-31 ENCOUNTER — Other Ambulatory Visit: Payer: Self-pay | Admitting: Oncology

## 2012-03-31 DIAGNOSIS — D649 Anemia, unspecified: Secondary | ICD-10-CM

## 2012-03-31 DIAGNOSIS — D696 Thrombocytopenia, unspecified: Secondary | ICD-10-CM

## 2012-03-31 DIAGNOSIS — R509 Fever, unspecified: Secondary | ICD-10-CM

## 2012-03-31 DIAGNOSIS — E876 Hypokalemia: Secondary | ICD-10-CM

## 2012-03-31 DIAGNOSIS — C50919 Malignant neoplasm of unspecified site of unspecified female breast: Secondary | ICD-10-CM

## 2012-03-31 DIAGNOSIS — G8929 Other chronic pain: Secondary | ICD-10-CM

## 2012-03-31 DIAGNOSIS — R5381 Other malaise: Secondary | ICD-10-CM

## 2012-03-31 LAB — COMPREHENSIVE METABOLIC PANEL
ALT: 23 U/L (ref 0–35)
AST: 53 U/L — ABNORMAL HIGH (ref 0–37)
Alkaline Phosphatase: 201 U/L — ABNORMAL HIGH (ref 39–117)
CO2: 21 mEq/L (ref 19–32)
Calcium: 8.8 mg/dL (ref 8.4–10.5)
Chloride: 100 mEq/L (ref 96–112)
GFR calc Af Amer: 84 mL/min — ABNORMAL LOW (ref >90)
GFR calc non Af Amer: 72 mL/min — ABNORMAL LOW (ref >90)
Glucose, Bld: 111 mg/dL — ABNORMAL HIGH (ref 70–99)
Sodium: 132 mEq/L — ABNORMAL LOW (ref 135–145)
Total Bilirubin: 0.3 mg/dL (ref 0.3–1.2)

## 2012-03-31 LAB — DIFFERENTIAL
Blasts: 0 %
Eosinophils Absolute: 0 10*3/uL (ref 0.0–0.7)
Eosinophils Relative: 0 % (ref 0–5)
Metamyelocytes Relative: 0 %
Monocytes Relative: 5 % (ref 3–12)
Myelocytes: 0 %
Neutro Abs: 48.4 10*3/uL — ABNORMAL HIGH (ref 1.7–7.7)
Neutrophils Relative %: 87 % — ABNORMAL HIGH (ref 43–77)
nRBC: 0 /100 WBC

## 2012-03-31 LAB — CBC
MCH: 31.7 pg (ref 26.0–34.0)
Platelets: 45 10*3/uL — ABNORMAL LOW (ref 150–400)
RBC: 2.84 MIL/uL — ABNORMAL LOW (ref 3.87–5.11)
RDW: 18.7 % — ABNORMAL HIGH (ref 11.5–15.5)
WBC: 55.6 10*3/uL (ref 4.0–10.5)

## 2012-03-31 MED ORDER — HEPARIN SOD (PORK) LOCK FLUSH 100 UNIT/ML IV SOLN
INTRAVENOUS | Status: AC
  Filled 2012-03-31: qty 5

## 2012-03-31 MED ORDER — GABAPENTIN 100 MG PO CAPS: 100.0000 mg | ORAL_CAPSULE | Freq: Three times a day (TID) | ORAL | Status: DC

## 2012-03-31 MED ORDER — VALSARTAN 320 MG PO TABS: 320.0000 mg | ORAL_TABLET | Freq: Every day | ORAL | Status: DC

## 2012-03-31 MED ORDER — PROCHLORPERAZINE MALEATE 10 MG PO TABS: 10.0000 mg | ORAL_TABLET | Freq: Four times a day (QID) | ORAL | Status: DC | PRN

## 2012-03-31 MED ORDER — CLOPIDOGREL BISULFATE 75 MG PO TABS: 75.0000 mg | ORAL_TABLET | Freq: Every day | ORAL | Status: DC

## 2012-03-31 MED ORDER — POTASSIUM CHLORIDE CRYS ER 20 MEQ PO TBCR: 20.0000 meq | EXTENDED_RELEASE_TABLET | Freq: Two times a day (BID) | ORAL | Status: DC

## 2012-03-31 MED ORDER — TRAMADOL HCL 50 MG PO TABS: 50.0000 mg | ORAL_TABLET | Freq: Four times a day (QID) | ORAL | Status: AC | PRN

## 2012-03-31 MED ORDER — VITAMINS A & D EX OINT
TOPICAL_OINTMENT | CUTANEOUS | Status: AC
  Administered 2012-03-31: 10
  Filled 2012-03-31: qty 10

## 2012-03-31 MED ORDER — AMLODIPINE BESYLATE 5 MG PO TABS: 5.0000 mg | ORAL_TABLET | Freq: Every day | ORAL | Status: DC

## 2012-03-31 MED ORDER — ATORVASTATIN CALCIUM 20 MG PO TABS: 20.0000 mg | ORAL_TABLET | Freq: Every day | ORAL | Status: DC

## 2012-03-31 MED ORDER — TRIAMTERENE-HCTZ 37.5-25 MG PO TABS: 1.0000 | ORAL_TABLET | Freq: Every day | ORAL | Status: DC

## 2012-03-31 MED ORDER — METOPROLOL TARTRATE 100 MG PO TABS: 100.0000 mg | ORAL_TABLET | Freq: Every morning | ORAL | Status: DC

## 2012-03-31 MED ORDER — ISOSORBIDE DINITRATE 30 MG PO TABS: 30.0000 mg | ORAL_TABLET | Freq: Two times a day (BID) | ORAL | Status: DC

## 2012-03-31 NOTE — Telephone Encounter (Signed)
S/w pt re appt for 4/22 @ 9:15 am. Pt also aware 4/15 appt cx'd.

## 2012-03-31 NOTE — Discharge Summary (Signed)
Physician Discharge Summary  Patient ID: [name] MRN: [id number] DOB/AGE: 22-Jan-1961 51 y.o.  Admit date: 03/27/2012 Discharge date: 03/31/2012  Primary Care Physician:  Dow Adolph, MD, MD   Discharge Diagnoses:  Fever; symptomatic anemia; severe thrombocytopenia; leukocytosis; shortness of breath; atelectasis; deconditioning; hypokalemia;breast cancer; hypertension  Present on Admission:  all  Discharge Medications:  Medication List  As of 03/31/2012  8:41 AM   STOP taking these medications         doxycycline 100 MG DR capsule      oxyCODONE-acetaminophen 5-325 MG per tablet         TAKE these medications         amLODipine 5 MG tablet   Commonly known as: NORVASC   Take 5 mg by mouth daily.      amLODipine 5 MG tablet   Commonly known as: NORVASC   Take 1 tablet (5 mg total) by mouth daily.      aspirin 325 MG tablet   Take 325 mg by mouth daily.      atorvastatin 20 MG tablet   Commonly known as: LIPITOR   Take 1 tablet (20 mg total) by mouth daily.      clopidogrel 75 MG tablet   Commonly known as: PLAVIX   Take 1 tablet (75 mg total) by mouth daily.      clopidogrel 75 MG tablet   Commonly known as: PLAVIX   Take 1 tablet (75 mg total) by mouth daily with breakfast.      gabapentin 100 MG capsule   Commonly known as: NEURONTIN   Take 1 capsule (100 mg total) by mouth 3 (three) times daily.      isosorbide dinitrate 30 MG tablet   Commonly known as: ISORDIL   Take 1 tablet (30 mg total) by mouth 2 (two) times daily.      isosorbide dinitrate 30 MG tablet   Commonly known as: ISORDIL   Take 1 tablet (30 mg total) by mouth 2 (two) times daily.      lidocaine-prilocaine cream   Commonly known as: EMLA   Apply topically as needed.      metoprolol 100 MG tablet   Commonly known as: LOPRESSOR   Take 1 tablet (100 mg total) by mouth every morning.      metoprolol succinate 100 MG 24 hr tablet   Commonly known as: TOPROL-XL   Take 1 tablet (100 mg total) by mouth daily. Take with or immediately following a meal.      multivitamin with minerals tablet   Take 1 tablet by mouth daily.      nitroGLYCERIN 0.4 MG SL tablet   Commonly known as: NITROSTAT   Place 1 tablet (0.4 mg total) under the tongue every 5 (five) minutes as needed for chest pain.      potassium chloride SA 20 MEQ tablet   Commonly known as: K-DUR,KLOR-CON   Take 1 tablet (20 mEq total) by mouth 3 (three) times daily.      potassium chloride SA 20 MEQ tablet   Commonly known as: K-DUR,KLOR-CON   Take 1 tablet (20 mEq total) by mouth 2 (two) times daily.      prochlorperazine 10 MG tablet   Commonly known as: COMPAZINE   Take 1 tablet (10 mg total) by mouth as directed.      prochlorperazine 10 MG tablet   Commonly known as: COMPAZINE   Take 1 tablet (10 mg total) by mouth every 6 (six) hours  as needed.      pyridOXINE 50 MG tablet   Commonly known as: VITAMIN B-6   Take 50 mg by mouth daily.      traMADol 50 MG tablet   Commonly known as: ULTRAM   Take 1 tablet (50 mg total) by mouth every 6 (six) hours as needed.      triamterene-hydrochlorothiazide 37.5-25 MG per tablet   Commonly known as: MAXZIDE-25   Take 1 tablet by mouth daily.      triamterene-hydrochlorothiazide 37.5-25 MG per tablet   Commonly known as: MAXZIDE-25   Take 1 each (1 tablet total) by mouth daily.      valsartan 320 MG tablet   Commonly known as: DIOVAN   Take 1 tablet (320 mg total) by mouth daily.      valsartan 320 MG tablet   Commonly known as: DIOVAN   Take 1 tablet (320 mg total) by mouth daily.             Disposition and Follow-up: to cancer clinic 04/10/2012  Significant Diagnostic Studies:  Dg Chest 2 View  03/30/2012  *RADIOLOGY REPORT*  Clinical Data: Shortness of breath, history of breast cancer  CHEST - 2 VIEW  Comparison: 03/27/2012  Findings: Mild linear/patchy right midlung opacity, new, atelectasis versus pneumonia. No pleural  effusion or pneumothorax.  The heart is top normal in size.  Right chest power port.  Mild degenerative changes of the visualized thoracolumbar spine.  IMPRESSION: Mild right midlung opacity, new, atelectasis versus pneumonia.  Original Report Authenticated By: Charline Bills, M.D.   X-ray Chest Pa And Lateral   03/27/2012  *RADIOLOGY REPORT*  Clinical Data: Chest pain.  Fever.  Nausea.  Breast cancer.  CHEST - 2 VIEW  Comparison: 08/22/2011  Findings: Right-sided power port catheter noted with tip projecting over the SVC.  Mild cardiomegaly noted with linear subsegmental atelectasis or scar in the lingula mildly prominent epicardial adipose tissue noted.  Clips from prior left axillary dissection noted. Thoracic aortic tortuosity noted.  The patient is rotated to the right on today's exam, resulting in reduced diagnostic sensitivity and specificity.  IMPRESSION:  1.  Mild cardiomegaly and mild prominent epicardial adipose tissue. 2.  Subsegmental atelectasis or scar in the lingula. 3.  Tortuous aorta.  Original Report Authenticated By: Dellia Cloud, M.D.  Transthoracic Echocardiography  Patient: [name], [name] MR #: [id number] Study Date: 03/28/2012 Gender: F Age: 51 Height: 165.1cm Weight: 108.6kg BSA: 2.85m^2 Pt. Status: Room: 1308  PERFORMING [facility] ADMITTING Aubrianna Orchard, [place] ATTENDING Desani Sprung, [place] ORDERING Nathan Stallworth, [place] SONOGRAPHER Cathie Beams, RCS cc:  ------------------------------------------------------------ LV EF: 60% - 65%  ------------------------------------------------------------ Indications: Previous study 10/05/2011. CHF - 428.0.  ------------------------------------------------------------ History: PMH: Breast cancer. Coronary artery disease. Risk factors: Hypertension. Dyslipidemia.  ------------------------------------------------------------ Study Conclusions  - Left ventricle: The cavity size was  normal. There was moderate concentric hypertrophy. Systolic function was normal. The estimated ejection fraction was in the range of 60% to 65%. Wall motion was normal; there were no regional wall motion abnormalities. Doppler parameters are consistent with abnormal left ventricular relaxation (grade 1 diastolic dysfunction). - Pulmonary arteries: Systolic pressure was mildly increased. PA peak pressure: 42mm Hg (S). Transthoracic echocardiography. M-mode, complete 2D, spectral Doppler, and color Doppler. Height: Height: 165.1cm. Height: 65in. Weight: Weight: 108.6kg. Weight: 238.9lb. Body mass index: BMI: 39.8kg/m^2. Body surface area: BSA: 2.65m^2. Blood pressure: 103/62. Patient status: Inpatient. Location: Bedside.  Discharge Laboratory Values: Lab Results  Component Value Date  WBC 55.6* 03/31/2012   HGB 9.0* 03/31/2012   HCT 25.8* 03/31/2012   PLT 45* 03/31/2012   GLUCOSE 111* 03/31/2012   CHOL 154 01/08/2011   TRIG 117 01/08/2011   HDL 45 01/08/2011   LDLCALC 86 01/08/2011   ALT 23 03/31/2012   AST 53* 03/31/2012   NA 132* 03/31/2012   K 3.5 03/31/2012   CL 100 03/31/2012   CREATININE 0.92 03/31/2012   BUN 6 03/31/2012   CO2 21 03/31/2012   TSH 3.626 03/28/2012   INR 1.25 03/28/2012   MICROALBUR 0.51 07/31/2009    Brief H and P: For complete details please refer to admission H and P, but in brief, this patient being actively treated with chemotherapy for breast cancer was admitted with fever, shortness of breath, and severe anemia and thrombocytopenia.  Physical Exam at Discharge: BP 135/79  Pulse 98  Temp(Src) 98.1 F (36.7 C) (Oral)  Resp 18  Ht 5\' 5"  (1.651 m)  Wt 239 lb 6 oz (108.58 kg)  BMI 39.83 kg/m2  SpO2 95% ZOX:[id number] but ambulatory Cardiovascular RRR, no murmur appreciated Respiratory:no rales or rhonchi, fair excursion bilatrally Gastrointestinal:benign Extremities:no edema    Hospital Course:  Active Problems:  Anemia  Thrombocytopenia  Chronic  pain  Hypokalemia  Physical deconditioning  Fever of undetermined origin The patient was pan-cultured and started on broad-spectrum antibiotics (imipenem, vancomycin); her blood and urine cultures remain negative; she had significant leukocytosis secondary to her recent neulasta dose; chest X-ray showed no PNA. She slowly defervesced, and has remained afebrile >24h before discharge. The patient remains deconditioned and that is likely a significant contributor to her SOB. She had mild improvement in her SOB with transfusion of 2 units PRBCs. Echocardiogram showed a good EF. Patient has had CT-angiogram and lower-extremity dopplers recently for evaluation of similar symptoms and these have been negative. At the time of discharge she is ambulatory but tires easily. He platelet count was <30K secondary to chemotherapy but by the time of discharge was improving as noted in labs above.He potassium was corrected both IV and po. The patient will require further chemotherapy for her breast cancer but her regimen will have to be adjusted to prevent similar episodes in the future. This will be discussed further at her next clinic visit.  Diet:  As before  Activity:  As tolerated; to ambulate as mucha as possible  Condition at Discharge:   improved  Signed: Dr. Ruthann Cancer [phone]  03/31/2012, 8:41 AM
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
## 2012-03-31 NOTE — Progress Notes (Signed)
Patient discharged home, all discharge medications and instructions reviewed and questions answered.  Patient sates will not take the diovan that was ordered at discharge.  Patient to be assisted to vehicle when ride arrives.

## 2012-04-03 ENCOUNTER — Other Ambulatory Visit: Payer: Self-pay | Admitting: Lab

## 2012-04-03 ENCOUNTER — Encounter: Payer: Self-pay | Admitting: Oncology

## 2012-04-03 ENCOUNTER — Ambulatory Visit: Payer: Self-pay

## 2012-04-03 LAB — CULTURE, BLOOD (ROUTINE X 2)
Culture  Setup Time: 201304090826
Culture: NO GROWTH

## 2012-04-03 LAB — CULTURE, BLOOD (SINGLE)

## 2012-04-03 NOTE — Progress Notes (Signed)
Patient received nine prescriptions from [place] [facility] on 03/31/12 $49.11,her remaninig balance [facility] $94.74 and ALIGHT $600.00

## 2012-04-04 ENCOUNTER — Telehealth (INDEPENDENT_AMBULATORY_CARE_PROVIDER_SITE_OTHER): Payer: Self-pay | Admitting: General Surgery

## 2012-04-04 NOTE — Telephone Encounter (Signed)
Pt calling to report new onset pain in Lt axilla; pain started 03/30/12.  She describes throbbing and constant pain, sometimes sharp. Denies radiation treatments, but is still on chemo.  Needs appt to see Dr. Johna Sheriff to evaluate pain.

## 2012-04-05 ENCOUNTER — Telehealth (INDEPENDENT_AMBULATORY_CARE_PROVIDER_SITE_OTHER): Payer: Self-pay | Admitting: General Surgery

## 2012-04-05 ENCOUNTER — Ambulatory Visit (INDEPENDENT_AMBULATORY_CARE_PROVIDER_SITE_OTHER): Payer: Self-pay | Admitting: General Surgery

## 2012-04-05 ENCOUNTER — Other Ambulatory Visit: Payer: Self-pay | Admitting: Oncology

## 2012-04-05 ENCOUNTER — Encounter (INDEPENDENT_AMBULATORY_CARE_PROVIDER_SITE_OTHER): Payer: Self-pay | Admitting: General Surgery

## 2012-04-05 ENCOUNTER — Telehealth: Payer: Self-pay | Admitting: *Deleted

## 2012-04-05 VITALS — BP 124/87 | HR 88 | Temp 97.4°F | Ht 65.0 in | Wt 241.0 lb

## 2012-04-05 DIAGNOSIS — C50919 Malignant neoplasm of unspecified site of unspecified female breast: Secondary | ICD-10-CM

## 2012-04-05 MED ORDER — HYDROCODONE-ACETAMINOPHEN 5-325 MG PO TABS: 1.0000 | ORAL_TABLET | ORAL | Status: AC | PRN
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
## 2012-04-05 NOTE — Progress Notes (Signed)
Chief complaint: Followup bilateral mastectomy with chest wall nodule and axillary pain  History: Patient returns for followup status post bilateral mastectomy. She has completed her chemotherapy. She states that about 6 days ago she got fairly severe sharp pain in her left axilla at the site of some scar tissue. It required her to go to the emergency room. She also notices a little nodule in the skin more medially on the left side she is concerned about. No problems on the right.  Exam: Gen.: In no distress Chest wall: There is a area of scar at the lateral aspect of her incision under the left axilla at the little more firm where she had an open wound from infection. However there is no mass here and no inflammation or open wound for drainage. At the medial aspect of the incision just above the incision overlying a rib is a soft rounded about 1 cm nodule that I think is entirely consistent with scar tissue.  I reassured the patient that I don't see any evidence of infection or major problem. I think we should keep it on both these areas. I did give her some pain medication she requested. I asked her to return in 2 months to recheck both of these spots

## 2012-04-05 NOTE — Telephone Encounter (Signed)
Message left by pt requesting a return call post noon today due to continued swelling of her right leg with pain. " Dr Darnelle Catalan gave me some pain meds and they help some but I really need you to call me "  This RN will call pt post noon today.

## 2012-04-05 NOTE — Telephone Encounter (Signed)
This RN spoke with pt per call- [name] states R leg continues to swell . Swelling seems to be related to activity and decreases with rest and elevation. Noted areas of tenderness have not changed and are dependent on amount of swelling.  [name] inquiry is relating to why leg continues to swell ( pt has had doppler with negative reading )- this RN discussed with pt possible reasons of swelling including bad circulation-  Note pt is on multiple BP medications which this RN reviewed with her as well as reinterated need to be consistant in taking for best control.  [name] also mentioned concern over area at surgical site she showed to [name] which he mentioned could be bx. Pt is going to [name] today due to axillary pain and this RN informed pt to show area to him - due to he may be able to do biopsy.  [name]'s last inquiry was when could she have a pedicure with this RN informing her best to wait 4 weeks.  No other needs at this time.

## 2012-04-05 NOTE — Progress Notes (Signed)
Follow up appointment given to patient for 04/27/12 @ 12:50 pm w/Dr. Johna Sheriff

## 2012-04-05 NOTE — Telephone Encounter (Signed)
[name] scheduled (work in) for follow up appointment 04/05/12 with Dr. Johna Sheriff

## 2012-04-05 NOTE — Telephone Encounter (Signed)
Patient calls complaining of left axilla pain x6 days per patient. She describes as a throbbing pain under arm. Does not travel anywhere else. No swelling arm. No redness or drainage. No fevers or chills. Patient states no heavy lifting or strain to the arm. Patient was transferred to speak with Neysa Bonito re: an appt.

## 2012-04-07 ENCOUNTER — Telehealth: Payer: Self-pay | Admitting: *Deleted

## 2012-04-07 NOTE — Telephone Encounter (Signed)
Message left

## 2012-04-07 NOTE — Telephone Encounter (Signed)
Pt left message requesting a return call due to ongoing leg swelling.  Per message Miriya states R ankle " swolled up last night " " looked like and elephant leg "  Pt states L leg is now developing tender spots as well.  Message reviewed with MD with recommendation pt should follow up with primary care due to negative work up in past. Concerns may be more BP or cardiac related.  This RN returned call and obtain identified VM.  Detailed message left per above with this RN's name and return call number given.

## 2012-04-10 ENCOUNTER — Telehealth: Payer: Self-pay | Admitting: *Deleted

## 2012-04-10 ENCOUNTER — Encounter: Payer: Self-pay | Admitting: Physician Assistant

## 2012-04-10 ENCOUNTER — Other Ambulatory Visit (HOSPITAL_BASED_OUTPATIENT_CLINIC_OR_DEPARTMENT_OTHER): Payer: Self-pay | Admitting: Lab

## 2012-04-10 ENCOUNTER — Ambulatory Visit (HOSPITAL_BASED_OUTPATIENT_CLINIC_OR_DEPARTMENT_OTHER): Payer: Self-pay | Admitting: Physician Assistant

## 2012-04-10 ENCOUNTER — Ambulatory Visit (HOSPITAL_BASED_OUTPATIENT_CLINIC_OR_DEPARTMENT_OTHER): Payer: Self-pay

## 2012-04-10 VITALS — BP 129/86 | HR 77 | Temp 97.9°F | Ht 65.0 in | Wt 233.7 lb

## 2012-04-10 DIAGNOSIS — C773 Secondary and unspecified malignant neoplasm of axilla and upper limb lymph nodes: Secondary | ICD-10-CM

## 2012-04-10 DIAGNOSIS — C50419 Malignant neoplasm of upper-outer quadrant of unspecified female breast: Secondary | ICD-10-CM

## 2012-04-10 DIAGNOSIS — E785 Hyperlipidemia, unspecified: Secondary | ICD-10-CM

## 2012-04-10 DIAGNOSIS — C50919 Malignant neoplasm of unspecified site of unspecified female breast: Secondary | ICD-10-CM

## 2012-04-10 DIAGNOSIS — M79609 Pain in unspecified limb: Secondary | ICD-10-CM

## 2012-04-10 DIAGNOSIS — Z5112 Encounter for antineoplastic immunotherapy: Secondary | ICD-10-CM

## 2012-04-10 DIAGNOSIS — E876 Hypokalemia: Secondary | ICD-10-CM

## 2012-04-10 DIAGNOSIS — I10 Essential (primary) hypertension: Secondary | ICD-10-CM

## 2012-04-10 DIAGNOSIS — I2510 Atherosclerotic heart disease of native coronary artery without angina pectoris: Secondary | ICD-10-CM

## 2012-04-10 DIAGNOSIS — M79606 Pain in leg, unspecified: Secondary | ICD-10-CM

## 2012-04-10 LAB — CBC WITH DIFFERENTIAL/PLATELET
BASO%: 1.1 % (ref 0.0–2.0)
Basophils Absolute: 0.3 10*3/uL — ABNORMAL HIGH (ref 0.0–0.1)
EOS%: 1 % (ref 0.0–7.0)
Eosinophils Absolute: 0.3 10*3/uL (ref 0.0–0.5)
HCT: 34.9 % (ref 34.8–46.6)
HGB: 11.5 g/dL — ABNORMAL LOW (ref 11.6–15.9)
MCV: 94.1 fL (ref 79.5–101.0)
MONO#: 2.5 10*3/uL — ABNORMAL HIGH (ref 0.1–0.9)
NEUT#: 18.8 10*3/uL — ABNORMAL HIGH (ref 1.5–6.5)
NEUT%: 69 % (ref 38.4–76.8)
Platelets: 320 10*3/uL (ref 145–400)
WBC: 27.2 10*3/uL — ABNORMAL HIGH (ref 3.9–10.3)
nRBC: 1 % — ABNORMAL HIGH (ref 0–0)

## 2012-04-10 LAB — COMPREHENSIVE METABOLIC PANEL
AST: 58 U/L — ABNORMAL HIGH (ref 0–37)
Albumin: 3.3 g/dL — ABNORMAL LOW (ref 3.5–5.2)
Alkaline Phosphatase: 174 U/L — ABNORMAL HIGH (ref 39–117)
BUN: 10 mg/dL (ref 6–23)
Potassium: 3.5 mEq/L (ref 3.5–5.3)
Sodium: 136 mEq/L (ref 135–145)

## 2012-04-10 MED ORDER — SODIUM CHLORIDE 0.9 % IV SOLN
Freq: Once | INTRAVENOUS | Status: AC
  Administered 2012-04-10: 11:00:00 via INTRAVENOUS

## 2012-04-10 MED ORDER — SODIUM CHLORIDE 0.9 % IJ SOLN
10.0000 mL | INTRAMUSCULAR | Status: DC | PRN
  Administered 2012-04-10: 10 mL
  Filled 2012-04-10: qty 10

## 2012-04-10 MED ORDER — TRASTUZUMAB CHEMO INJECTION 440 MG
6.0000 mg/kg | Freq: Once | INTRAVENOUS | Status: AC
  Administered 2012-04-10: 609 mg via INTRAVENOUS
  Filled 2012-04-10: qty 29

## 2012-04-10 MED ORDER — HEPARIN SOD (PORK) LOCK FLUSH 100 UNIT/ML IV SOLN
500.0000 [IU] | Freq: Once | INTRAVENOUS | Status: AC | PRN
  Administered 2012-04-10: 500 [IU]
  Filled 2012-04-10: qty 5

## 2012-04-10 MED ORDER — METHADONE HCL 5 MG PO TABS: 5.0000 mg | ORAL_TABLET | Freq: Two times a day (BID) | ORAL | Status: DC | PRN

## 2012-04-10 MED ORDER — ACETAMINOPHEN 325 MG PO TABS
650.0000 mg | ORAL_TABLET | Freq: Once | ORAL | Status: AC
  Administered 2012-04-10: 650 mg via ORAL
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
## 2012-04-10 NOTE — Patient Instructions (Signed)
Patient aware of next appointment; discharged home with no complaints. 

## 2012-04-10 NOTE — Telephone Encounter (Signed)
made patient appointment for echo on 04-13-2012 at [place] and with dr.bensinhom on 04-21-2012 at 10:00am  

## 2012-04-10 NOTE — Progress Notes (Signed)
Patient refused Benadryl this treatment

## 2012-04-10 NOTE — Telephone Encounter (Signed)
made patient appointment for echo on 04-13-2012 at [facility] and with dr.bensinhom on 04-21-2012 at 10:00am

## 2012-04-13 ENCOUNTER — Other Ambulatory Visit (HOSPITAL_COMMUNITY): Payer: Self-pay

## 2012-04-17 ENCOUNTER — Encounter: Payer: Self-pay | Admitting: Radiation Oncology

## 2012-04-17 ENCOUNTER — Telehealth: Payer: Self-pay | Admitting: *Deleted

## 2012-04-17 NOTE — Telephone Encounter (Signed)
gave patient appointment for [facility] on 06-09-2012 at 9:15am patient has to be recertificated on 05-08-2012 at 10:30am

## 2012-04-17 NOTE — Progress Notes (Signed)
51 year old female.   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 2 of 18 nodes positive and so T3N1, stage III. ER, PR, and HER 2 positive. Underwent bilateral mastectomy on 08/10/2011. In the right breast there were multiple foci of invasive lobular carcinoma. Patient received chemotherapy consisting of docetaxel, carboplatin, and trastuzumab every three weeks for a total of six cycles. Trastuzumab to continue for one year. Per Zollie Scale, PA note patient had trastuzumab on 04/10/2012 with next round to be given in three weeks. Also, note read she is being referred back for further cardiac evaluation, given a script for methadone 5 mg, and missed her initial appt with Dr. Dayton Scrape due to hospitalization.  Dr. Darnelle Catalan referred patient to Dr. Dayton Scrape for postmastectomy radiation   NKDA  No indication of a pacemaker  No hx of radiation therapy

## 2012-04-18 ENCOUNTER — Other Ambulatory Visit: Payer: Self-pay | Admitting: *Deleted

## 2012-04-18 ENCOUNTER — Ambulatory Visit
Admission: RE | Admit: 2012-04-18 | Discharge: 2012-04-18 | Disposition: A | Discharge status: Home / Self Care | Payer: Medicaid Other | Source: Ambulatory Visit | Attending: Radiation Oncology | Admitting: Radiation Oncology

## 2012-04-18 VITALS — BP 116/74 | HR 87 | Temp 97.6°F | Resp 18 | Ht 65.0 in | Wt 228.6 lb

## 2012-04-18 DIAGNOSIS — Z170 Estrogen receptor positive status [ER+]: Secondary | ICD-10-CM | POA: Insufficient documentation

## 2012-04-18 DIAGNOSIS — C50919 Malignant neoplasm of unspecified site of unspecified female breast: Secondary | ICD-10-CM

## 2012-04-18 DIAGNOSIS — C773 Secondary and unspecified malignant neoplasm of axilla and upper limb lymph nodes: Secondary | ICD-10-CM | POA: Insufficient documentation

## 2012-04-18 DIAGNOSIS — L989 Disorder of the skin and subcutaneous tissue, unspecified: Secondary | ICD-10-CM | POA: Insufficient documentation

## 2012-04-18 DIAGNOSIS — I890 Lymphedema, not elsewhere classified: Secondary | ICD-10-CM | POA: Insufficient documentation

## 2012-04-18 DIAGNOSIS — Z9221 Personal history of antineoplastic chemotherapy: Secondary | ICD-10-CM | POA: Insufficient documentation

## 2012-04-18 DIAGNOSIS — Z9010 Acquired absence of unspecified breast and nipple: Secondary | ICD-10-CM | POA: Insufficient documentation

## 2012-04-18 DIAGNOSIS — Z510 Encounter for antineoplastic radiation therapy: Secondary | ICD-10-CM | POA: Insufficient documentation

## 2012-04-18 NOTE — Progress Notes (Signed)
Please see progress note under physician encounter 

## 2012-04-18 NOTE — Progress Notes (Signed)
Complete NUTRITION RISK SCREEN worksheet without concerns submitted to Zenovia Jarred, RD. Also, complete PATIENT MEASURE OF DISTRESS worksheet submitted to social work. Patient marked 10 on the distress thermometer but, later confirmed that was her pain level.

## 2012-04-18 NOTE — Progress Notes (Signed)
Followup note:  Diagnosis: Pathologic stage IIIA (T3, N1, M0) invasive ductal carcinoma of the left breast. Also history of multiple foci of invasive lobular carcinoma (T1 N0) involving the right breast.  History: [name] is a 51 year old female who is seen today for review and scheduling of her left chest wall and regional node irradiation in the management of her stage IIIA  (T1, N1, M0) invasive lobular carcinoma of the left breast. I first saw patient consultation at the [facility] on 07/14/2011. She will onto have a modified radical mastectomy in the left and a right simple mastectomy. On review of her pathology she is found to have a 5.7 cm invasive ductal carcinoma with extensive LV I. along with extensive intraductal carcinoma. 2 of 18 lymph nodes containing metastatic disease. Her tumor was ER positive at 100% and weakly PR positive at 4%. Ki-67 was 81% and she was HER-2/neu positive. On the right she had 2 foci of invasive lobular carcinoma measuring 1.7 and 0.7 cm. A single lymph node was free of metastatic disease. Her carcinoma was ER positive at 81% and PR negative at 0%. She will onto receive adjuvant chemotherapy beginning in late November and finishing almost one month ago. She received 6 q. 3 week cycles of Taxol, carboplatin and trastuzumab. She'll continue with her trastuzumab for a total of one year. She is without complaints except for a possible vaginal yeast infection.  Physical examination: Alert and oriented. Wt Readings from Last 3 Encounters:  04/18/12 228 lb 9.6 oz (103.692 kg)  04/10/12 233 lb 11.2 oz (106.006 kg)  04/05/12 241 lb (109.317 kg)   Temp Readings from Last 3 Encounters:  04/18/12 97.6 F (36.4 C) Oral  04/10/12 97.9 F (36.6 C) Oral  04/05/12 97.4 F (36.3 C) Temporal   BP Readings from Last 3 Encounters:  04/18/12 116/74  04/10/12 129/86  04/05/12 124/87   Pulse Readings from Last 3 Encounters:  04/18/12 87  04/10/12 77  04/05/12 88    Head and  neck examination: Remarkable for alopecia. Nodes: Without palpable cervical, supraclavicular, or axillary lymphadenopathy. Chest: Bilateral mastectomies without palpable or visible evidence for recurrent disease. Lungs clear. Heart: Regular. Abdomen: Without masses organomegaly. Extremities: Without edema.  Labs:  Lab Results  Component Value Date   WBC 27.2* 04/10/2012   HGB 11.5* 04/10/2012   HCT 34.9 04/10/2012   MCV 94.1 04/10/2012   PLT 320 04/10/2012   Impression: Pathologic stage III A (T3, N1, M0) invasive ductal carcinoma of left breast. She also has pathologic stage I A. (T1, N0, M0) invasive lobular carcinoma of the right breast. She should receive post mastectomy radiation therapy to her left chest wall and regional lymph nodes. We discussed risk factors for local regional recurrence including the size of her primary tumor, presence of lymphovascular space invasion, and lymph node positivity. An estimate her risk for local regional failure without radiation to be at least 25-30%, and decreased to less than 10% with post mastectomy radiation directed to her left chest wall and regional lymph nodes. I discussed the potential acute and late toxicities of radiation therapy including both pulmonary and cardiac toxicity. She also understands that there is an increased risk for left upper extremity lymphedema. All of her questions were answered. I'll have her return for treatment planning in the near future. Consent is signed today. Lastly, she'll contact Savannah Heckle, PA for management of her possible vaginal yeast infection.  45 minutes was spent face-to-face with the patient, primarily counseling  the patient.

## 2012-04-18 NOTE — Progress Notes (Signed)
Encounter addended by: Agnes Lawrence, RN on: 04/18/2012  4:43 PM<BR>     Documentation filed: Notes Section

## 2012-04-18 NOTE — Progress Notes (Signed)
Patient presents to the clinic today unaccompanied for a follow up new consult with Dr. Dayton Scrape reference postmastectomy radiation. Patient is alert and oriented to person, place, and time. No distress noted. Steady gait noted. Pleasant affect noted. Patient reports burning and pressure like pain in her perineal area 10 on a scale of 0-10. Patient reports two weeks ago she had what she felt like was a yeast infection and began using Monistat. Patient reports her yeast infection went away but is back. Patient reports now that she is using A and D ointment for the rash in her perineal area and vagisil to relieve the itch. Patient reports taking Doxycycline 100 mg bid for boil under arm prior to hospitalization 03/27/2012. Patient concerned she may have blood clots in her right leg. Patient requested this writer feel two raised areas of the right lower leg. Patient reports that Dr. Darnelle Catalan "has been sending her for dopplers." Patient concerned with non pitting bilateral lower leg edema. Patient to follow up with [facility] on May 20th and obtain new PCP. Patient reports that she has no insurance at this time. Patient denies nausea or vomiting. Patient reports occasional headaches at the base of her skull but denies dizziness. Patient denies diarrhea. Ten pound weight loss noted since hospitalization but patient reports eating healthier. Reported all findings to Dr. Dayton Scrape.

## 2012-04-19 NOTE — Progress Notes (Signed)
Encounter addended by: Agnes Lawrence, RN on: 04/19/2012 10:26 AM<BR>     Documentation filed: Charges VN, Inpatient Patient Education

## 2012-04-20 ENCOUNTER — Encounter: Payer: Self-pay | Admitting: Oncology

## 2012-04-20 NOTE — Progress Notes (Signed)
Patient received one prescription from [facility] on 04/18/12 $3.85,her remaninig balance [facility] $90.89 and ALIGHT $600.00.

## 2012-04-21 ENCOUNTER — Other Ambulatory Visit: Payer: Self-pay | Admitting: *Deleted

## 2012-04-21 ENCOUNTER — Ambulatory Visit (HOSPITAL_COMMUNITY): Admission: RE | Admit: 2012-04-21 | Payer: Self-pay | Source: Ambulatory Visit

## 2012-04-21 DIAGNOSIS — C50919 Malignant neoplasm of unspecified site of unspecified female breast: Secondary | ICD-10-CM

## 2012-04-21 DIAGNOSIS — M79606 Pain in leg, unspecified: Secondary | ICD-10-CM

## 2012-04-21 MED ORDER — METHADONE HCL 5 MG PO TABS: 10.0000 mg | ORAL_TABLET | Freq: Two times a day (BID) | ORAL | Status: DC | PRN

## 2012-04-24 ENCOUNTER — Ambulatory Visit: Payer: Self-pay

## 2012-04-25 ENCOUNTER — Ambulatory Visit: Payer: Medicaid Other

## 2012-04-25 ENCOUNTER — Encounter: Payer: Self-pay | Admitting: Oncology

## 2012-04-25 ENCOUNTER — Other Ambulatory Visit: Payer: Self-pay | Admitting: Radiation Oncology

## 2012-04-25 ENCOUNTER — Ambulatory Visit: Payer: Medicaid Other | Admitting: Radiation Oncology

## 2012-04-25 NOTE — Progress Notes (Signed)
Patient received one prescription from [facility] on 04/21/12 $4.33,her remaninig balance [facility] $86.56 and ALIGHT $600.00.

## 2012-04-26 ENCOUNTER — Ambulatory Visit: Payer: Medicaid Other

## 2012-04-26 ENCOUNTER — Ambulatory Visit: Payer: Medicaid Other | Admitting: Radiation Oncology

## 2012-04-27 ENCOUNTER — Telehealth (INDEPENDENT_AMBULATORY_CARE_PROVIDER_SITE_OTHER): Payer: Self-pay | Admitting: General Surgery

## 2012-04-27 ENCOUNTER — Encounter (INDEPENDENT_AMBULATORY_CARE_PROVIDER_SITE_OTHER): Payer: Self-pay | Admitting: General Surgery

## 2012-05-01 ENCOUNTER — Ambulatory Visit (HOSPITAL_BASED_OUTPATIENT_CLINIC_OR_DEPARTMENT_OTHER): Payer: Self-pay | Admitting: Lab

## 2012-05-01 ENCOUNTER — Other Ambulatory Visit: Payer: Self-pay | Admitting: *Deleted

## 2012-05-01 ENCOUNTER — Ambulatory Visit (HOSPITAL_BASED_OUTPATIENT_CLINIC_OR_DEPARTMENT_OTHER): Payer: Self-pay

## 2012-05-01 VITALS — BP 135/86 | HR 75 | Temp 97.5°F

## 2012-05-01 DIAGNOSIS — C50919 Malignant neoplasm of unspecified site of unspecified female breast: Secondary | ICD-10-CM

## 2012-05-01 DIAGNOSIS — Z5112 Encounter for antineoplastic immunotherapy: Secondary | ICD-10-CM

## 2012-05-01 DIAGNOSIS — R102 Pelvic and perineal pain: Secondary | ICD-10-CM

## 2012-05-01 LAB — URINALYSIS, MICROSCOPIC - CHCC
Ketones: NEGATIVE mg/dL
Protein: NEGATIVE mg/dL
pH: 6 (ref 4.6–8.0)

## 2012-05-01 MED ORDER — SODIUM CHLORIDE 0.9 % IV SOLN
Freq: Once | INTRAVENOUS | Status: AC
  Administered 2012-05-01: 10:00:00 via INTRAVENOUS

## 2012-05-01 MED ORDER — HEPARIN SOD (PORK) LOCK FLUSH 100 UNIT/ML IV SOLN
500.0000 [IU] | Freq: Once | INTRAVENOUS | Status: AC | PRN
  Administered 2012-05-01: 500 [IU]
  Filled 2012-05-01: qty 5

## 2012-05-01 MED ORDER — METRONIDAZOLE 500 MG PO TABS: 2000.0000 mg | ORAL_TABLET | Freq: Once | ORAL | Status: AC

## 2012-05-01 MED ORDER — TRASTUZUMAB CHEMO INJECTION 440 MG
6.0000 mg/kg | Freq: Once | INTRAVENOUS | Status: AC
  Administered 2012-05-01: 609 mg via INTRAVENOUS
  Filled 2012-05-01: qty 29

## 2012-05-01 MED ORDER — SODIUM CHLORIDE 0.9 % IJ SOLN
10.0000 mL | INTRAMUSCULAR | Status: DC | PRN
  Administered 2012-05-01: 10 mL
  Filled 2012-05-01: qty 10

## 2012-05-01 MED ORDER — ACETAMINOPHEN 325 MG PO TABS
650.0000 mg | ORAL_TABLET | Freq: Once | ORAL | Status: AC
  Administered 2012-05-01: 650 mg via ORAL

## 2012-05-01 NOTE — Telephone Encounter (Signed)
This RN spoke with pt per her concern due to ongoing vaginal/labia discomfort with noted burning with urination.  U/A obtained with noted infection including trichomonas.  Per MD review Flagyl prescribed.  Informed pt nature of infection is sexually transmitted and she needs to inform sexual partner appropriately for therapy.  [name] verbalized understanding.

## 2012-05-01 NOTE — Patient Instructions (Signed)
[facility] Discharge Instructions for Patients Receiving Chemotherapy  Today you received the following chemotherapy agents Herceptin  To help prevent nausea and vomiting after your treatment, we encourage you to take your nausea medication Begin taking it at 7 pm and take it as often as prescribed for the next 24 to 72 hours.   If you develop nausea and vomiting that is not controlled by your nausea medication, call the clinic. If it is after clinic hours your family physician or the after hours number for the clinic or go to the Emergency Department.   BELOW ARE SYMPTOMS THAT SHOULD BE REPORTED IMMEDIATELY:  *FEVER GREATER THAN 100.5 F  *CHILLS WITH OR WITHOUT FEVER  NAUSEA AND VOMITING THAT IS NOT CONTROLLED WITH YOUR NAUSEA MEDICATION  *UNUSUAL SHORTNESS OF BREATH  *UNUSUAL BRUISING OR BLEEDING  TENDERNESS IN MOUTH AND THROAT WITH OR WITHOUT PRESENCE OF ULCERS  *URINARY PROBLEMS  *BOWEL PROBLEMS  UNUSUAL RASH Items with * indicate a potential emergency and should be followed up as soon as possible.  One of the nurses will contact you 24 hours after your treatment. Please let the nurse know about any problems that you may have experienced. Feel free to call the clinic you have any questions or concerns. The clinic phone number is [phone].   I have been informed and understand all the instructions given to me. I know to contact the clinic, my physician, or go to the Emergency Department if any problems should occur. I do not have any questions at this time, but understand that I may call the clinic during office hours   should I have any questions or need assistance in obtaining follow up care.    __________________________________________  _____________  __________ Signature of Patient or Authorized Representative            Date                   Time    __________________________________________ Nurse's Signature    

## 2012-05-02 ENCOUNTER — Ambulatory Visit
Admission: RE | Admit: 2012-05-02 | Discharge: 2012-05-02 | Disposition: A | Discharge status: Home / Self Care | Payer: Medicaid Other | Source: Ambulatory Visit | Attending: Radiation Oncology | Admitting: Radiation Oncology

## 2012-05-02 DIAGNOSIS — C50919 Malignant neoplasm of unspecified site of unspecified female breast: Secondary | ICD-10-CM

## 2012-05-02 NOTE — Telephone Encounter (Signed)
Patient scheduled for 05/18/12 @ 12:40 w/Dr.Hoxworth, pt need's to arrive 30 min prior to appt.  No other time available until July.

## 2012-05-02 NOTE — Progress Notes (Signed)
Met with patient to discuss RO billing. Pt has no insurance and was given MCD and EPP appl to complete. Pt also states she has an appt with [facility] program (orange card) to update her status, due to it expired on 02/03/2012. Pt has also advised, she has a SSD case pending with a lawyer at this time.   Rad Tx: [id number] Extrl Beam  Attending Rad: Dr. Dayton Scrape  Dx: 174.9 Female Breast, NOS (excludes skin of breast T-173.5)

## 2012-05-03 ENCOUNTER — Encounter: Payer: Self-pay | Admitting: Oncology

## 2012-05-03 LAB — URINE CULTURE

## 2012-05-03 NOTE — Progress Notes (Signed)
Patient received one prescription from [facility] on 05/02/12 $3.00,her remaninig balance [facility] $83.56 and ALIGHT $600.00.

## 2012-05-04 NOTE — Progress Notes (Signed)
Simulation/treatment planning note:  The patient underwent simulation/treatment planning on May 02, 2012. She was taken to the CT simulator. A custom neck mold was constructed on a breast board. Her left partial mastectomy scar was marked with a radiopaque wire. A BB was placed along an inferior left chest wall drain site. The field borders were marked medially, laterally, superiorly, and inferiorly. She was setup to medial, and lateral left chest wall tangents. 2 separate multileaf collimators were designed to conform the field. She was then set up to her left axilla/supraclavicular region, RAO and a separate multileaf,  was designed. Lastly, she was set up PA for her axillary boost and a fourth multileaf collimator was designed for a total of 5 complex treatment. I'm prescribing 5000 cGy in 28 sessions to her left chest wall with 1.0 cm custom bolus applied to her chest wall every other day. This be followed by electron beam boost to her left chest wall for a further 1000 cGy in 5 sessions utilizing 6 MEV electrons with 0.5 cm bolus. The left supraclavicular/axillary region will receive 5040 cGy in 28 sessions with a PA field to bring the mid axillary dose up to  4600 cGy in 28 sessions. An isodose plan and dosimetry are requested.

## 2012-05-08 ENCOUNTER — Ambulatory Visit (HOSPITAL_COMMUNITY): Payer: Medicaid Other | Attending: Internal Medicine

## 2012-05-09 ENCOUNTER — Ambulatory Visit: Payer: Medicaid Other | Admitting: Radiation Oncology

## 2012-05-10 ENCOUNTER — Other Ambulatory Visit: Payer: Self-pay | Admitting: Oncology

## 2012-05-10 ENCOUNTER — Other Ambulatory Visit: Payer: Self-pay | Admitting: *Deleted

## 2012-05-10 ENCOUNTER — Ambulatory Visit: Payer: Medicaid Other

## 2012-05-10 ENCOUNTER — Encounter: Payer: Self-pay | Admitting: Radiation Oncology

## 2012-05-10 ENCOUNTER — Ambulatory Visit
Admission: RE | Admit: 2012-05-10 | Discharge: 2012-05-10 | Disposition: A | Discharge status: Home / Self Care | Payer: Medicaid Other | Source: Ambulatory Visit | Attending: Radiation Oncology | Admitting: Radiation Oncology

## 2012-05-10 DIAGNOSIS — I10 Essential (primary) hypertension: Secondary | ICD-10-CM

## 2012-05-10 MED ORDER — VALSARTAN 320 MG PO TABS: 320.0000 mg | ORAL_TABLET | Freq: Every day | ORAL | Status: DC

## 2012-05-10 NOTE — Progress Notes (Signed)
Simulation verification note: The patient underwent similar to verification for treatment to her left chest wall and regional lymph nodes. Her isocenter is in good position and the multileaf collimators contoured the treatment volume appropriately.
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
## 2012-05-11 ENCOUNTER — Encounter: Payer: Self-pay | Admitting: Oncology

## 2012-05-11 ENCOUNTER — Ambulatory Visit
Admission: RE | Admit: 2012-05-11 | Discharge: 2012-05-11 | Disposition: A | Discharge status: Home / Self Care | Payer: Medicaid Other | Source: Ambulatory Visit | Attending: Radiation Oncology | Admitting: Radiation Oncology

## 2012-05-11 NOTE — Progress Notes (Signed)
Patient received five prescriptions from [place] [facility] pharmacy on 05/10/12 $38.82,her remaninig balance [facility] $44.74 and ALIGHT $600.00

## 2012-05-12 ENCOUNTER — Ambulatory Visit: Admission: RE | Admit: 2012-05-12 | Payer: Medicaid Other | Source: Ambulatory Visit

## 2012-05-12 ENCOUNTER — Telehealth: Payer: Self-pay | Admitting: *Deleted

## 2012-05-12 ENCOUNTER — Ambulatory Visit
Admission: RE | Admit: 2012-05-12 | Discharge: 2012-05-12 | Disposition: A | Discharge status: Home / Self Care | Payer: Medicaid Other | Source: Ambulatory Visit | Attending: Radiation Oncology | Admitting: Radiation Oncology

## 2012-05-12 ENCOUNTER — Other Ambulatory Visit: Payer: Self-pay | Admitting: Oncology

## 2012-05-12 ENCOUNTER — Encounter (HOSPITAL_COMMUNITY): Payer: Self-pay | Admitting: *Deleted

## 2012-05-12 ENCOUNTER — Emergency Department (HOSPITAL_COMMUNITY)
Admission: EM | Admit: 2012-05-12 | Discharge: 2012-05-12 | Disposition: A | Discharge status: Home / Self Care | Payer: Medicaid Other | Attending: Emergency Medicine | Admitting: Emergency Medicine

## 2012-05-12 DIAGNOSIS — Z79899 Other long term (current) drug therapy: Secondary | ICD-10-CM | POA: Insufficient documentation

## 2012-05-12 DIAGNOSIS — M7989 Other specified soft tissue disorders: Secondary | ICD-10-CM

## 2012-05-12 DIAGNOSIS — F17200 Nicotine dependence, unspecified, uncomplicated: Secondary | ICD-10-CM | POA: Insufficient documentation

## 2012-05-12 DIAGNOSIS — Z9861 Coronary angioplasty status: Secondary | ICD-10-CM | POA: Insufficient documentation

## 2012-05-12 DIAGNOSIS — M79609 Pain in unspecified limb: Secondary | ICD-10-CM | POA: Insufficient documentation

## 2012-05-12 DIAGNOSIS — H9209 Otalgia, unspecified ear: Secondary | ICD-10-CM | POA: Insufficient documentation

## 2012-05-12 DIAGNOSIS — C50919 Malignant neoplasm of unspecified site of unspecified female breast: Secondary | ICD-10-CM | POA: Insufficient documentation

## 2012-05-12 DIAGNOSIS — E785 Hyperlipidemia, unspecified: Secondary | ICD-10-CM | POA: Insufficient documentation

## 2012-05-12 DIAGNOSIS — I10 Essential (primary) hypertension: Secondary | ICD-10-CM | POA: Insufficient documentation

## 2012-05-12 DIAGNOSIS — I2510 Atherosclerotic heart disease of native coronary artery without angina pectoris: Secondary | ICD-10-CM | POA: Insufficient documentation

## 2012-05-12 MED ORDER — OXYCODONE-ACETAMINOPHEN 5-325 MG PO TABS: 1.0000 | ORAL_TABLET | Freq: Four times a day (QID) | ORAL | Status: AC | PRN

## 2012-05-12 MED ORDER — GUAIFENESIN ER 1200 MG PO TB12: 1.0000 | ORAL_TABLET | Freq: Two times a day (BID) | ORAL | Status: DC

## 2012-05-12 MED ORDER — HYDROCODONE-ACETAMINOPHEN 5-325 MG PO TABS: 1.0000 | ORAL_TABLET | Freq: Four times a day (QID) | ORAL | Status: DC | PRN

## 2012-05-12 NOTE — Discharge Instructions (Signed)
Return here as needed. Follow up with your PCP for a recheck.

## 2012-05-12 NOTE — ED Provider Notes (Signed)
Received pt at sign out from [facility], PA-C.  Pt was awaiting venous doppler study of LUE to r/o DVT.  Doppler study is negative.  Reassurance given.  Pt initially given prescription of norco for pain, but sts she's unable to tolerates norco and prefers percocet instead.  Prescription changed.     [name], [name]  Female  [birth year]  [id number]            Progress Notes signed by Kerrin Champagne at 05/12/12 1538     Author:  Kerrin Champagne  Service:  (none)  Author Type:  Technician   Filed:  05/12/12 1538  Note Time:  05/12/12 1536          VASCULAR LAB  PRELIMINARY PRELIMINARY PRELIMINARY PRELIMINARY  Left upper extremity venous duplex completed.  Preliminary report: Left : No evidence of DVT or superficial thrombosis.  SLAUGHTER, VIR[place] D, RVS  05/12/2012, 3:36 PM     Savannah Helper, PA-C 05/12/12 1549

## 2012-05-12 NOTE — ED Provider Notes (Signed)
Medical screening examination/treatment/procedure(s) were conducted as a shared visit with non-physician practitioner(s) and myself.  I personally evaluated the patient during the encounter  Given the patient's status post mastectomy and current radiation or her left upper chest an ultrasound will be obtained to evaluate for possible DVT in her left arm swelling  Lyanne Co, MD 05/12/12 1726

## 2012-05-12 NOTE — Telephone Encounter (Signed)
Pt. Here to pick up rx for methadone per her conversation with Mena Pauls yesterday.  Dr. Darnelle Catalan and Amy Allyson Sabal are both out of the office this afternoon.  Pt. States she has enough to last until Tuesday, when she will be here for radiation and she will pick up script then.  Also had call from our pharmacist.  Pt. Is on list to have herceptin this Tuesday 5/28 and it is too early.  Had patient go to schedulers and have all her appts redone to reflect a Q 3week herceptin schedule.
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
## 2012-05-12 NOTE — Telephone Encounter (Signed)
send email to [facility] about patient needing 05-01-2012 every three weeks for the patient's herceptin

## 2012-05-12 NOTE — ED Notes (Signed)
Pt states "I'm a breast cancer survivor, had a double mastectomy last August, was cancer free in February, my left arm started swelling on Wednesday and right ear has been hurting for like a week"

## 2012-05-12 NOTE — Progress Notes (Signed)
VASCULAR LAB PRELIMINARY  PRELIMINARY  PRELIMINARY  PRELIMINARY  Left upper extremity venous duplex completed.    Preliminary report:  Left :  No evidence of DVT or superficial thrombosis.    Leza Apsey D, RVS 05/12/2012, 3:36 PM

## 2012-05-12 NOTE — Telephone Encounter (Signed)
Per e-mail from [place], patient needs to be scheduled for herceptin every three weeks for a year. I have scheduled seven treatment, due to the fact no MD visit.  JMW

## 2012-05-12 NOTE — ED Provider Notes (Signed)
Medical screening examination/treatment/procedure(s) were conducted as a shared visit with non-physician practitioner(s) and myself.  I personally evaluated the patient during the encounter   Lyanne Co, MD 05/12/12 1726

## 2012-05-15 ENCOUNTER — Ambulatory Visit: Payer: Self-pay

## 2012-05-16 ENCOUNTER — Ambulatory Visit: Payer: Medicaid Other

## 2012-05-16 ENCOUNTER — Encounter: Payer: Self-pay | Admitting: Oncology

## 2012-05-16 ENCOUNTER — Ambulatory Visit: Payer: Self-pay

## 2012-05-16 ENCOUNTER — Telehealth: Payer: Self-pay | Admitting: Radiation Oncology

## 2012-05-16 ENCOUNTER — Other Ambulatory Visit: Payer: Self-pay | Admitting: Lab

## 2012-05-16 ENCOUNTER — Other Ambulatory Visit: Payer: Self-pay | Admitting: *Deleted

## 2012-05-16 DIAGNOSIS — M79606 Pain in leg, unspecified: Secondary | ICD-10-CM

## 2012-05-16 DIAGNOSIS — C50919 Malignant neoplasm of unspecified site of unspecified female breast: Secondary | ICD-10-CM

## 2012-05-16 MED ORDER — METHADONE HCL 5 MG PO TABS: 10.0000 mg | ORAL_TABLET | Freq: Two times a day (BID) | ORAL | Status: DC | PRN

## 2012-05-16 NOTE — Telephone Encounter (Signed)
Pt was referred by [employer] to get a gas card. Pt was given and EPP & MCD appl on a later date. However, today, she told me that she will bring it back with a letter of support on her next visit. Pt also advised she did received the f/u letter from [place], to bring back necessary updated information, in order to complete EPP & MCD appl's. Pt has NO insurance at this time.  $50 Gas Card given 05/16/2012  $44.74 [facility] $5.26 ALIGHT  EPP BAL'S [facility]  $0 ALIGHT $594.74

## 2012-05-16 NOTE — Progress Notes (Signed)
Patient received one prescription from [place] [facility] pharmacy on 05/12/12 $3.00,her remaninig balance [facility] -0-and ALIGHT $591.74.

## 2012-05-16 NOTE — Telephone Encounter (Signed)
Called pt to inquire why she cancelled her radiation appt today. Pt states "I think I'm getting a little cold." Informed the patient that she could still receive radiation if she had a cold. Advised her that it was very important she keep all her appts for radiation and be consistent with her treatment. She stated she would be here for tx tomorrow and knew her treatment time.

## 2012-05-17 ENCOUNTER — Ambulatory Visit: Payer: Medicaid Other

## 2012-05-18 ENCOUNTER — Telehealth: Payer: Self-pay | Admitting: *Deleted

## 2012-05-18 ENCOUNTER — Ambulatory Visit: Payer: Medicaid Other

## 2012-05-18 ENCOUNTER — Encounter (INDEPENDENT_AMBULATORY_CARE_PROVIDER_SITE_OTHER): Payer: Self-pay | Admitting: General Surgery

## 2012-05-18 NOTE — Telephone Encounter (Signed)
Spoke w/pt re: missed radiation appt today. She states she is taking Coricidin for her cold symptoms and "is feeling better". She also states her diarrhea is resolving. Advised pt to take Imodium if diarrhea doesn't continue to resolve. She states she "spoke w/Val about the swelling she had in her arm earlier this week and is being sent to lymphedema clinic". Per epic chart, pt had doppler of left arm 05/12/12; results negative for DVT. She states she plans to be in for radiation tomorrow, 4:20 pm. Confirmed her tx time of 4:20 pm.

## 2012-05-19 ENCOUNTER — Ambulatory Visit: Payer: Medicaid Other

## 2012-05-22 ENCOUNTER — Other Ambulatory Visit: Payer: Self-pay | Admitting: Lab

## 2012-05-22 ENCOUNTER — Ambulatory Visit: Payer: Medicaid Other

## 2012-05-22 ENCOUNTER — Ambulatory Visit: Payer: Self-pay

## 2012-05-23 ENCOUNTER — Ambulatory Visit
Admission: RE | Admit: 2012-05-23 | Discharge: 2012-05-23 | Disposition: A | Discharge status: Home / Self Care | Payer: Medicaid Other | Source: Ambulatory Visit | Attending: Radiation Oncology | Admitting: Radiation Oncology

## 2012-05-23 ENCOUNTER — Encounter: Payer: Self-pay | Admitting: Radiation Oncology

## 2012-05-23 ENCOUNTER — Ambulatory Visit: Payer: Medicaid Other

## 2012-05-23 ENCOUNTER — Telehealth: Payer: Self-pay | Admitting: *Deleted

## 2012-05-23 VITALS — BP 110/68 | HR 72 | Resp 20 | Wt 219.1 lb

## 2012-05-23 DIAGNOSIS — C50919 Malignant neoplasm of unspecified site of unspecified female breast: Secondary | ICD-10-CM

## 2012-05-23 MED ORDER — ALRA NON-METALLIC DEODORANT (RAD-ONC)
1.0000 "application " | Freq: Once | TOPICAL | Status: AC
  Administered 2012-05-23: 1 via TOPICAL

## 2012-05-23 MED ORDER — RADIAPLEXRX EX GEL
Freq: Once | CUTANEOUS | Status: AC
  Administered 2012-05-23: 15:00:00 via TOPICAL
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
## 2012-05-23 NOTE — Progress Notes (Unsigned)
Issued gas card for RO tx $50.00 - Alight balance $541.74

## 2012-05-23 NOTE — Progress Notes (Signed)
Post sim teaching, radiation therapy and you book, alra and radiaplex gel, teach back given, patient gave verbal understanading and repeated how to use skin products and when 2:38 PM
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
## 2012-05-23 NOTE — Progress Notes (Signed)
Encounter addended by: Maryln Gottron, MD on: 05/23/2012  4:13 PM<BR>     Documentation filed: Notes Section

## 2012-05-24 ENCOUNTER — Ambulatory Visit: Payer: Medicaid Other

## 2012-05-24 ENCOUNTER — Ambulatory Visit: Payer: Self-pay

## 2012-05-24 ENCOUNTER — Other Ambulatory Visit: Payer: Self-pay | Admitting: Lab

## 2012-05-25 ENCOUNTER — Ambulatory Visit
Admission: RE | Admit: 2012-05-25 | Discharge: 2012-05-25 | Disposition: A | Discharge status: Home / Self Care | Payer: Medicaid Other | Source: Ambulatory Visit | Attending: Radiation Oncology | Admitting: Radiation Oncology

## 2012-05-25 ENCOUNTER — Encounter (INDEPENDENT_AMBULATORY_CARE_PROVIDER_SITE_OTHER): Payer: Self-pay | Admitting: General Surgery

## 2012-05-26 ENCOUNTER — Ambulatory Visit
Admission: RE | Admit: 2012-05-26 | Discharge: 2012-05-26 | Disposition: A | Discharge status: Home / Self Care | Payer: Medicaid Other | Source: Ambulatory Visit | Attending: Radiation Oncology | Admitting: Radiation Oncology

## 2012-05-29 ENCOUNTER — Ambulatory Visit: Payer: Medicaid Other

## 2012-05-29 ENCOUNTER — Telehealth: Payer: Self-pay | Admitting: *Deleted

## 2012-05-29 NOTE — Telephone Encounter (Signed)
Per patient call, I have rescheduled her appts.  JMW

## 2012-05-30 ENCOUNTER — Ambulatory Visit
Admission: RE | Admit: 2012-05-30 | Discharge: 2012-05-30 | Disposition: A | Discharge status: Home / Self Care | Payer: Medicaid Other | Source: Ambulatory Visit | Attending: Radiation Oncology | Admitting: Radiation Oncology

## 2012-05-30 ENCOUNTER — Other Ambulatory Visit: Payer: Self-pay | Admitting: Oncology

## 2012-05-30 ENCOUNTER — Ambulatory Visit
Admission: RE | Admit: 2012-05-30 | Discharge: 2012-05-30 | Disposition: A | Discharge status: Home / Self Care | Payer: Medicaid Other | Source: Ambulatory Visit | Admitting: Radiation Oncology

## 2012-05-30 ENCOUNTER — Encounter: Payer: Self-pay | Admitting: Radiation Oncology

## 2012-05-30 ENCOUNTER — Other Ambulatory Visit: Payer: Self-pay | Admitting: Physician Assistant

## 2012-05-30 VITALS — BP 123/84 | HR 81 | Temp 98.1°F | Wt 215.2 lb

## 2012-05-30 DIAGNOSIS — C50919 Malignant neoplasm of unspecified site of unspecified female breast: Secondary | ICD-10-CM

## 2012-05-30 DIAGNOSIS — I2510 Atherosclerotic heart disease of native coronary artery without angina pectoris: Secondary | ICD-10-CM
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
## 2012-05-30 NOTE — Progress Notes (Signed)
6 fractions to Left chest Wall.  No voiced complaints of pain or fatigue.  Note swelling in left and and arm.  Waiting for the lymphedema clinic to call, but will check on her appt.  Skin intact and soft. Reiterated applying radiaplex to left upper shoulder  Blade, left upper chest and left chest wall.

## 2012-05-31 ENCOUNTER — Ambulatory Visit
Admission: RE | Admit: 2012-05-31 | Discharge: 2012-05-31 | Disposition: A | Discharge status: Home / Self Care | Payer: Medicaid Other | Source: Ambulatory Visit | Attending: Radiation Oncology | Admitting: Radiation Oncology

## 2012-05-31 ENCOUNTER — Telehealth: Payer: Self-pay | Admitting: Oncology

## 2012-05-31 ENCOUNTER — Other Ambulatory Visit: Payer: Self-pay | Admitting: Radiation Oncology

## 2012-05-31 ENCOUNTER — Other Ambulatory Visit: Payer: Self-pay | Admitting: Oncology

## 2012-05-31 ENCOUNTER — Telehealth: Payer: Self-pay | Admitting: Radiation Oncology

## 2012-05-31 ENCOUNTER — Encounter: Payer: Self-pay | Admitting: *Deleted

## 2012-05-31 DIAGNOSIS — I890 Lymphedema, not elsewhere classified: Secondary | ICD-10-CM

## 2012-05-31 NOTE — Telephone Encounter (Signed)
Patient left me a message 5:27pm 05/30/12 stating she needed another gas card and would be by on Wednesday to pick up.  However, she has already received two gas cards within the last two weeks and has exhausted her resources to receive additional gas cards.  Also, her [employer] has expired as of 02/13/12 and I have been waiting for her to return the paperwork in order to process for additional bills to be paid in the future.  Have talked with Leotis Shames, CSW, and she is in agreement that resources have been exhausted and Leotis Shames has been in contact with the patient as well.  Patient stopped by around 10:00am on 05/31/12 and I let her know that no additional gas cards could be issued to her and that she has been given the maximum.  She expressed understanding.

## 2012-05-31 NOTE — Progress Notes (Signed)
PROGRESS NOTE FROM CHERYL MINTZ [place]ON, RN HAS BEEN PRINTED ALONG WITH REFERRAL GIVEN TO MARTI/ DONNA DURING [facility] TODAY

## 2012-05-31 NOTE — Telephone Encounter (Signed)
lmonvm adviisng the pt of her revised appt time on 06/05/2012

## 2012-06-01 ENCOUNTER — Encounter: Payer: Self-pay | Admitting: *Deleted

## 2012-06-01 ENCOUNTER — Ambulatory Visit
Admission: RE | Admit: 2012-06-01 | Discharge: 2012-06-01 | Disposition: A | Discharge status: Home / Self Care | Payer: Medicaid Other | Source: Ambulatory Visit | Attending: Radiation Oncology | Admitting: Radiation Oncology

## 2012-06-01 ENCOUNTER — Encounter: Payer: Self-pay | Admitting: Oncology

## 2012-06-01 ENCOUNTER — Ambulatory Visit (HOSPITAL_COMMUNITY): Payer: Self-pay

## 2012-06-01 NOTE — Progress Notes (Signed)
[facility] Psychosocial Distress Screening Clinical Social Work  Clinical Social Work was referred by distress screening protocol.  The patient scored a 9 on the Psychosocial Distress Thermometer which indicates severe distress. Clinical Social Worker has met with patient multiple times in CSW office to provide support.  [name] is very familiar with available resources and has begun utilizing yoga/taichi classes. CSW explained role of patient and family support team and encouraged her to call if she has any questions or concerns.   Clinical Social Worker follow up needed: no  If yes, follow up plan:  Kathrin Penner, MSW, [facility] Clinical Social Worker [facility] [phone]

## 2012-06-01 NOTE — Progress Notes (Signed)
Patient came by yesterday needing a gas card but I gave her the number to [facility] to call to help with transportation for her.

## 2012-06-02 ENCOUNTER — Ambulatory Visit
Admission: RE | Admit: 2012-06-02 | Discharge: 2012-06-02 | Disposition: A | Discharge status: Home / Self Care | Payer: Medicaid Other | Source: Ambulatory Visit | Attending: Radiation Oncology | Admitting: Radiation Oncology

## 2012-06-02 ENCOUNTER — Encounter: Payer: Self-pay | Admitting: Oncology

## 2012-06-02 ENCOUNTER — Other Ambulatory Visit: Payer: Self-pay | Admitting: Physician Assistant

## 2012-06-02 DIAGNOSIS — C50919 Malignant neoplasm of unspecified site of unspecified female breast: Secondary | ICD-10-CM

## 2012-06-02 NOTE — Progress Notes (Signed)
PATIENT RECEIVED MEDICATIONS FROM [facility] 24.14 REMAINING BALANCE 509.32

## 2012-06-05 ENCOUNTER — Encounter: Payer: Self-pay | Admitting: Radiation Oncology

## 2012-06-05 ENCOUNTER — Telehealth: Payer: Self-pay | Admitting: *Deleted

## 2012-06-05 ENCOUNTER — Encounter: Payer: Self-pay | Admitting: Physician Assistant

## 2012-06-05 ENCOUNTER — Other Ambulatory Visit (HOSPITAL_BASED_OUTPATIENT_CLINIC_OR_DEPARTMENT_OTHER): Payer: Self-pay | Admitting: Lab

## 2012-06-05 ENCOUNTER — Ambulatory Visit (HOSPITAL_BASED_OUTPATIENT_CLINIC_OR_DEPARTMENT_OTHER): Payer: Self-pay | Admitting: Physician Assistant

## 2012-06-05 ENCOUNTER — Other Ambulatory Visit: Payer: Self-pay | Admitting: *Deleted

## 2012-06-05 ENCOUNTER — Other Ambulatory Visit: Payer: Self-pay | Admitting: Oncology

## 2012-06-05 ENCOUNTER — Ambulatory Visit (HOSPITAL_BASED_OUTPATIENT_CLINIC_OR_DEPARTMENT_OTHER): Payer: Self-pay

## 2012-06-05 ENCOUNTER — Ambulatory Visit
Admission: RE | Admit: 2012-06-05 | Discharge: 2012-06-05 | Disposition: A | Discharge status: Home / Self Care | Payer: Medicaid Other | Source: Ambulatory Visit | Attending: Radiation Oncology | Admitting: Radiation Oncology

## 2012-06-05 VITALS — BP 130/79 | HR 64 | Temp 98.1°F

## 2012-06-05 VITALS — BP 132/83 | HR 68 | Temp 97.4°F | Resp 20 | Wt 219.9 lb

## 2012-06-05 DIAGNOSIS — Z5112 Encounter for antineoplastic immunotherapy: Secondary | ICD-10-CM

## 2012-06-05 DIAGNOSIS — M79606 Pain in leg, unspecified: Secondary | ICD-10-CM

## 2012-06-05 DIAGNOSIS — Z170 Estrogen receptor positive status [ER+]: Secondary | ICD-10-CM

## 2012-06-05 DIAGNOSIS — C50919 Malignant neoplasm of unspecified site of unspecified female breast: Secondary | ICD-10-CM

## 2012-06-05 DIAGNOSIS — C773 Secondary and unspecified malignant neoplasm of axilla and upper limb lymph nodes: Secondary | ICD-10-CM

## 2012-06-05 DIAGNOSIS — I2510 Atherosclerotic heart disease of native coronary artery without angina pectoris: Secondary | ICD-10-CM

## 2012-06-05 DIAGNOSIS — Z9010 Acquired absence of unspecified breast and nipple: Secondary | ICD-10-CM

## 2012-06-05 LAB — CBC WITH DIFFERENTIAL/PLATELET
Basophils Absolute: 0 10*3/uL (ref 0.0–0.1)
EOS%: 1.5 % (ref 0.0–7.0)
Eosinophils Absolute: 0.1 10*3/uL (ref 0.0–0.5)
HGB: 13.7 g/dL (ref 11.6–15.9)
LYMPH%: 30.4 % (ref 14.0–49.7)
MCH: 31.1 pg (ref 25.1–34.0)
MCV: 90.7 fL (ref 79.5–101.0)
MONO%: 8 % (ref 0.0–14.0)
Platelets: 301 10*3/uL (ref 145–400)
RBC: 4.41 10*6/uL (ref 3.70–5.45)
RDW: 13.9 % (ref 11.2–14.5)

## 2012-06-05 LAB — COMPREHENSIVE METABOLIC PANEL
AST: 20 U/L (ref 0–37)
Albumin: 3.5 g/dL (ref 3.5–5.2)
Alkaline Phosphatase: 106 U/L (ref 39–117)
Potassium: 3.2 mEq/L — ABNORMAL LOW (ref 3.5–5.3)
Sodium: 137 mEq/L (ref 135–145)
Total Bilirubin: 0.3 mg/dL (ref 0.3–1.2)
Total Protein: 7.5 g/dL (ref 6.0–8.3)

## 2012-06-05 LAB — URINALYSIS, MICROSCOPIC - CHCC
Blood: NEGATIVE
Glucose: NEGATIVE g/dL
Leukocyte Esterase: NEGATIVE
Nitrite: NEGATIVE
Protein: NEGATIVE mg/dL
Specific Gravity, Urine: 1.02 (ref 1.003–1.035)

## 2012-06-05 MED ORDER — ACETAMINOPHEN 325 MG PO TABS
650.0000 mg | ORAL_TABLET | Freq: Once | ORAL | Status: AC
  Administered 2012-06-05: 650 mg via ORAL

## 2012-06-05 MED ORDER — DIPHENHYDRAMINE HCL 25 MG PO CAPS: 25.0000 mg | ORAL_CAPSULE | Freq: Once | ORAL | Status: DC

## 2012-06-05 MED ORDER — METHADONE HCL 5 MG PO TABS: ORAL_TABLET | ORAL | Status: DC

## 2012-06-05 MED ORDER — SODIUM CHLORIDE 0.9 % IJ SOLN
10.0000 mL | INTRAMUSCULAR | Status: DC | PRN
  Administered 2012-06-05: 10 mL
  Filled 2012-06-05: qty 10

## 2012-06-05 MED ORDER — SODIUM CHLORIDE 0.9 % IV SOLN: Freq: Once | INTRAVENOUS | Status: DC

## 2012-06-05 MED ORDER — HEPARIN SOD (PORK) LOCK FLUSH 100 UNIT/ML IV SOLN
500.0000 [IU] | Freq: Once | INTRAVENOUS | Status: AC | PRN
  Administered 2012-06-05: 500 [IU]
  Filled 2012-06-05: qty 5

## 2012-06-05 MED ORDER — TRASTUZUMAB CHEMO INJECTION 440 MG
6.0000 mg/kg | Freq: Once | INTRAVENOUS | Status: AC
  Administered 2012-06-05: 609 mg via INTRAVENOUS
  Filled 2012-06-05: qty 29

## 2012-06-05 NOTE — Telephone Encounter (Signed)
Gave patient appointments for 06-26-2012  07-17-2012   08-07-2012  08-28-2012 printed out calendar and gave to the patient in the chemo room emailed [facility] and [facility]

## 2012-06-05 NOTE — Progress Notes (Signed)
06/02/12 submitted [place] utility bill to pay from [place] for $331.75 - balance $185.85

## 2012-06-05 NOTE — Progress Notes (Signed)
Weekly Management Note:  Site:L chestwall/LNs Current Dose:  900  cGy Projected Dose: 5040  CGy plus boost   Narrative: The patient is seen today for routine under treatment assessment. CBCT/MVCT images/port films were reviewed. The chart was reviewed.   She is without new complaints today. She still has "sinus" and plans to address this with Dr. Aleda Grana who she will see at [facility] next week. She has a slight right earache. She uses Radioplex gel along her left chest wall. Physical Examination:  Filed Vitals:   06/05/12 1410  BP: 132/83  Pulse: 68  Temp: 97.4 F (36.3 C)  Resp: 20  .  Weight: 219 lb 14.4 oz (99.746 kg). Right ear canal is clear and tympanic membrane has normal light reflex. No significant skin changes on inspection of the left chest wall.  Impression: Tolerating radiation therapy well.  Plan: Continue radiation therapy as planned.

## 2012-06-05 NOTE — Telephone Encounter (Signed)
gave patient 06-26-2012 07-17-2012 08-07-2012 08-28-2012 printed out calendar and gave to the patient in the chemo room
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
## 2012-06-05 NOTE — Patient Instructions (Signed)
[facility] Discharge Instructions for Patients Receiving Chemotherapy  Today you received the following chemotherapy agent Herceptin.   To help prevent nausea and vomiting after your treatment, we encourage you to take your nausea medication.  Begin taking it as often as prescribed for by Dr. Magrinat.    If you develop nausea and vomiting that is not controlled by your nausea medication, call the clinic. If it is after clinic hours your family physician or the after hours number for the clinic or go to the Emergency Department.   BELOW ARE SYMPTOMS THAT SHOULD BE REPORTED IMMEDIATELY:  *FEVER GREATER THAN 100.5 F  *CHILLS WITH OR WITHOUT FEVER  NAUSEA AND VOMITING THAT IS NOT CONTROLLED WITH YOUR NAUSEA MEDICATION  *UNUSUAL SHORTNESS OF BREATH  *UNUSUAL BRUISING OR BLEEDING  TENDERNESS IN MOUTH AND THROAT WITH OR WITHOUT PRESENCE OF ULCERS  *URINARY PROBLEMS  *BOWEL PROBLEMS  UNUSUAL RASH Items with * indicate a potential emergency and should be followed up as soon as possible.  One of the nurses will contact you 24 hours after your treatment. Please let the nurse know about any problems that you may have experienced. Feel free to call the clinic you have any questions or concerns. The clinic phone number is [phone].   I have been informed and understand all the instructions given to me. I know to contact the clinic, my physician, or go to the Emergency Department if any problems should occur. I do not have any questions at this time, but understand that I may call the clinic during office hours   should I have any questions or need assistance in obtaining follow up care.    __________________________________________  _____________  __________ Signature of Patient or Authorized Representative            Date                   Time    __________________________________________ Nurse's Signature    

## 2012-06-05 NOTE — Progress Notes (Addendum)
Pt states she has hx sinus problems, was coughing up thick yellow phlegm but it is now white, not as thick, has constant right earache. She has taken Coricidin HBP w/some relief. She did take Methadone prn but is no longer taking. It did relieve her earache.  Applying Radiaplex to L chest wall tx area.

## 2012-06-06 ENCOUNTER — Telehealth: Payer: Self-pay | Admitting: *Deleted

## 2012-06-06 ENCOUNTER — Ambulatory Visit
Admission: RE | Admit: 2012-06-06 | Discharge: 2012-06-06 | Disposition: A | Discharge status: Home / Self Care | Payer: Medicaid Other | Source: Ambulatory Visit | Attending: Radiation Oncology | Admitting: Radiation Oncology

## 2012-06-06 ENCOUNTER — Encounter: Payer: Self-pay | Admitting: Radiation Oncology

## 2012-06-06 NOTE — Telephone Encounter (Signed)
Per staff message I have scheduled apts. JMW

## 2012-06-06 NOTE — Progress Notes (Signed)
9:55am - spoke to [facility] of [address] with promise to pay past due bill of $331.75 for patient.  Funds coming from [employer].

## 2012-06-07 ENCOUNTER — Ambulatory Visit
Admission: RE | Admit: 2012-06-07 | Discharge: 2012-06-07 | Disposition: A | Discharge status: Home / Self Care | Payer: Medicaid Other | Source: Ambulatory Visit | Attending: Radiation Oncology | Admitting: Radiation Oncology

## 2012-06-07 ENCOUNTER — Encounter: Payer: Self-pay | Admitting: Oncology

## 2012-06-07 NOTE — Progress Notes (Signed)
[facility] on 06/[facility]/12 the remaining balance should have been 567.60 correction is made. Also on 06/06/12 patient received medication from [facility] 7.18 remaining alight fund [phone]

## 2012-06-08 ENCOUNTER — Ambulatory Visit
Admission: RE | Admit: 2012-06-08 | Discharge: 2012-06-08 | Disposition: A | Discharge status: Home / Self Care | Payer: Medicaid Other | Source: Ambulatory Visit | Attending: Radiation Oncology | Admitting: Radiation Oncology

## 2012-06-08 ENCOUNTER — Telehealth: Payer: Self-pay | Admitting: Oncology

## 2012-06-08 ENCOUNTER — Encounter: Payer: Self-pay | Admitting: Radiation Oncology

## 2012-06-08 NOTE — Progress Notes (Signed)
UPDATE/correction:  [facility] balance $0.00   Alight Balance [id number]

## 2012-06-08 NOTE — Telephone Encounter (Signed)
Ms. Soltero called wanting to know if Oretha Milch submitted her [place] [employer] to [facility] for payment. Advised Marylene Land made a promise to pay call to  the [place] [employer] on 06/06/12 and spoke with Jasmine December and she forward the statement  to [facility] for payment.

## 2012-06-09 ENCOUNTER — Ambulatory Visit: Payer: Medicaid Other

## 2012-06-12 ENCOUNTER — Ambulatory Visit: Payer: Self-pay | Admitting: Physical Therapy

## 2012-06-12 ENCOUNTER — Ambulatory Visit
Admission: RE | Admit: 2012-06-12 | Discharge: 2012-06-12 | Disposition: A | Discharge status: Home / Self Care | Payer: Medicaid Other | Source: Ambulatory Visit | Attending: Radiation Oncology | Admitting: Radiation Oncology

## 2012-06-12 ENCOUNTER — Other Ambulatory Visit: Payer: Self-pay | Admitting: Lab

## 2012-06-12 ENCOUNTER — Other Ambulatory Visit: Payer: Self-pay | Admitting: Oncology

## 2012-06-12 ENCOUNTER — Ambulatory Visit: Payer: Self-pay

## 2012-06-12 ENCOUNTER — Encounter: Payer: Self-pay | Admitting: Radiation Oncology

## 2012-06-12 VITALS — BP 145/87 | HR 73 | Resp 18 | Wt 222.5 lb

## 2012-06-12 DIAGNOSIS — C50919 Malignant neoplasm of unspecified site of unspecified female breast: Secondary | ICD-10-CM

## 2012-06-12 NOTE — Progress Notes (Signed)
Patient request that this writer note in her chart that she "does not feel comfortable getting her medication from [facility] and would like to get it from [facility]."  Patient presents to the clinic today for PUT with Dr. Basilio Cairo. Patient is alert and oriented to person, place, and time. No distress noted. Steady gait noted. Pleasant affect noted. Patient denies pain at this time. Hyperpigmentation without desquamation of left neck and left chest wall noted. Patient reports using Radiaplex bid as directed. Patient has no complaints at this time. Reported all findings to Dr. Basilio Cairo.

## 2012-06-12 NOTE — Progress Notes (Signed)
   Weekly Management Note: Left Breast Cancer Current Dose:   2340cGy  Projected Dose: 6040 cGy   Narrative:  The patient presents for routine under treatment assessment.  CBCT/MVCT images/Port film x-rays were reviewed.  The chart was checked. She has no complaints.  Physical Findings:  weight is 222 lb 8 oz (100.925 kg). Her blood pressure is 145/87 and her pulse is 73. Her respiration is 18.  left chest wall is notable for diffuse hyperpigmentation. Left low neck shows hyperpigmentation as well. No desquamation.  Impression:  The patient is tolerating radiotherapy.  Plan:  Continue radiotherapy as planned. Continue radiaplex.  ________________________________   Lonie Peak, M.D.

## 2012-06-13 ENCOUNTER — Ambulatory Visit
Admission: RE | Admit: 2012-06-13 | Discharge: 2012-06-13 | Disposition: A | Discharge status: Home / Self Care | Payer: Medicaid Other | Source: Ambulatory Visit | Attending: Radiation Oncology | Admitting: Radiation Oncology

## 2012-06-14 ENCOUNTER — Ambulatory Visit: Payer: Medicaid Other

## 2012-06-15 ENCOUNTER — Ambulatory Visit: Payer: Medicaid Other

## 2012-06-15 ENCOUNTER — Ambulatory Visit: Payer: Self-pay | Admitting: Physical Therapy

## 2012-06-16 ENCOUNTER — Ambulatory Visit: Payer: Medicaid Other

## 2012-06-16 ENCOUNTER — Ambulatory Visit: Payer: Self-pay | Admitting: Physical Therapy

## 2012-06-19 ENCOUNTER — Ambulatory Visit: Payer: Medicaid Other

## 2012-06-19 ENCOUNTER — Encounter: Payer: Self-pay | Admitting: Radiation Oncology

## 2012-06-19 NOTE — Progress Notes (Signed)
Chart note:  I spoke with the patient today. She tells me that she has a family situation at [facility] that she needs to attend to. She is missed numerous treatments, and I told her that her treatment was becoming ineffective. She promises to visit [name] tomorrow for her treatment. I made it clear that she continues to miss treatments that we should probably discontinue her therapy altogether. She tells that she understands the seriousness of her situation and the need for her to come in as scheduled for her daily treatments. (Contact phone number [phone], [phone])

## 2012-06-20 ENCOUNTER — Other Ambulatory Visit (HOSPITAL_COMMUNITY): Payer: Self-pay

## 2012-06-20 ENCOUNTER — Ambulatory Visit
Admission: RE | Admit: 2012-06-20 | Discharge: 2012-06-20 | Disposition: A | Discharge status: Home / Self Care | Payer: Medicaid Other | Source: Ambulatory Visit | Attending: Radiation Oncology | Admitting: Radiation Oncology

## 2012-06-20 ENCOUNTER — Ambulatory Visit (HOSPITAL_COMMUNITY): Payer: Self-pay

## 2012-06-20 ENCOUNTER — Encounter: Payer: Self-pay | Admitting: Radiation Oncology

## 2012-06-20 VITALS — BP 160/96 | HR 71 | Temp 97.0°F | Resp 20 | Wt 218.2 lb

## 2012-06-20 DIAGNOSIS — C50919 Malignant neoplasm of unspecified site of unspecified female breast: Secondary | ICD-10-CM

## 2012-06-20 NOTE — Progress Notes (Signed)
Pt denies pain, loss of appetite, fatigue. She has very small open area in left axilla, no drainage, she noticed yesterday; advised she apply antibiotic ointment. Advised her no to apply deodorant. Will show Dr Dayton Scrape today.

## 2012-06-20 NOTE — Progress Notes (Signed)
Weekly Management Note:  Site:L Chestwall/regional LNs Current Dose:  2880  cGy Projected Dose: 6040  cGy  Narrative: The patient is seen today for routine under treatment assessment. CBCT/MVCT images/port films were reviewed. The chart was reviewed.   [name] continues to miss numerous treatments for family obligations. She tells me that a relative was ill yesterday and she had been [facility]. She had a recent right ear infection which was treated with antibiotics. She promises not miss any more treatments.  Physical Examination:  Filed Vitals:   06/20/12 1408  BP: 160/96  Pulse: 71  Temp: 97 F (36.1 C)  Resp: 20  .  Weight: 218 lb 3.2 oz (98.975 kg). There is mild hyperpigmentation the skin along the left chest wall/axilla with a 1 cm crusted area of moist desquamation along the left axilla which would not be expected considering a dose of radiation that she has received. I did not previously appreciate a tumor nodule this region.  Impression: Tolerating radiation therapy well, although her treatment has been intermittent and less than ideal from a radiobiological standpoint. I'm not sure what to make out of the small area of crusted desquamation along the axilla and adjacent to her mastectomy scar. I will follow this for the time being. I will have her apply antibiotic ointment.  Plan: Continue radiation therapy as planned.

## 2012-06-21 ENCOUNTER — Ambulatory Visit
Admission: RE | Admit: 2012-06-21 | Discharge: 2012-06-21 | Disposition: A | Discharge status: Home / Self Care | Payer: Medicaid Other | Source: Ambulatory Visit | Attending: Radiation Oncology | Admitting: Radiation Oncology

## 2012-06-23 ENCOUNTER — Other Ambulatory Visit: Payer: Self-pay | Admitting: *Deleted

## 2012-06-23 ENCOUNTER — Ambulatory Visit: Payer: Medicaid Other

## 2012-06-23 ENCOUNTER — Telehealth: Payer: Self-pay | Admitting: Oncology

## 2012-06-23 NOTE — Telephone Encounter (Signed)
Patient called left message about getting gas card. I returned patients call there was no answer left message on patient phone that there were no more gas cards that we could give her because she has already reached her limit. If she has any question I told her to give me call.

## 2012-06-26 ENCOUNTER — Ambulatory Visit
Admission: RE | Admit: 2012-06-26 | Discharge: 2012-06-26 | Disposition: A | Discharge status: Home / Self Care | Payer: Medicaid Other | Source: Ambulatory Visit | Attending: Radiation Oncology | Admitting: Radiation Oncology

## 2012-06-26 ENCOUNTER — Ambulatory Visit (HOSPITAL_BASED_OUTPATIENT_CLINIC_OR_DEPARTMENT_OTHER): Payer: Self-pay

## 2012-06-26 ENCOUNTER — Other Ambulatory Visit (HOSPITAL_BASED_OUTPATIENT_CLINIC_OR_DEPARTMENT_OTHER): Payer: Self-pay | Admitting: Lab

## 2012-06-26 ENCOUNTER — Other Ambulatory Visit: Payer: Self-pay | Admitting: *Deleted

## 2012-06-26 DIAGNOSIS — C50919 Malignant neoplasm of unspecified site of unspecified female breast: Secondary | ICD-10-CM

## 2012-06-26 DIAGNOSIS — C773 Secondary and unspecified malignant neoplasm of axilla and upper limb lymph nodes: Secondary | ICD-10-CM

## 2012-06-26 DIAGNOSIS — Z5112 Encounter for antineoplastic immunotherapy: Secondary | ICD-10-CM

## 2012-06-26 DIAGNOSIS — M79606 Pain in leg, unspecified: Secondary | ICD-10-CM

## 2012-06-26 DIAGNOSIS — C50419 Malignant neoplasm of upper-outer quadrant of unspecified female breast: Secondary | ICD-10-CM

## 2012-06-26 DIAGNOSIS — I2510 Atherosclerotic heart disease of native coronary artery without angina pectoris: Secondary | ICD-10-CM

## 2012-06-26 LAB — CBC WITH DIFFERENTIAL/PLATELET
Basophils Absolute: 0 10*3/uL (ref 0.0–0.1)
EOS%: 1.6 % (ref 0.0–7.0)
Eosinophils Absolute: 0.1 10*3/uL (ref 0.0–0.5)
HCT: 39.9 % (ref 34.8–46.6)
HGB: 13.7 g/dL (ref 11.6–15.9)
LYMPH%: 23.9 % (ref 14.0–49.7)
MCH: 30.5 pg (ref 25.1–34.0)
MCV: 88.9 fL (ref 79.5–101.0)
MONO%: 7.4 % (ref 0.0–14.0)
NEUT#: 2.4 10*3/uL (ref 1.5–6.5)
NEUT%: 66.8 % (ref 38.4–76.8)
Platelets: 269 10*3/uL (ref 145–400)

## 2012-06-26 MED ORDER — SODIUM CHLORIDE 0.9 % IV SOLN
Freq: Once | INTRAVENOUS | Status: AC
  Administered 2012-06-26: 13:00:00 via INTRAVENOUS

## 2012-06-26 MED ORDER — TRASTUZUMAB CHEMO INJECTION 440 MG
6.0000 mg/kg | Freq: Once | INTRAVENOUS | Status: AC
  Administered 2012-06-26: 609 mg via INTRAVENOUS
  Filled 2012-06-26: qty 29

## 2012-06-26 MED ORDER — ACETAMINOPHEN 325 MG PO TABS
650.0000 mg | ORAL_TABLET | Freq: Once | ORAL | Status: AC
  Administered 2012-06-26: 650 mg via ORAL

## 2012-06-26 MED ORDER — DIPHENHYDRAMINE HCL 25 MG PO CAPS
25.0000 mg | ORAL_CAPSULE | Freq: Once | ORAL | Status: AC
  Administered 2012-06-26: 25 mg via ORAL

## 2012-06-26 MED ORDER — METHADONE HCL 5 MG PO TABS: 15.0000 mg | ORAL_TABLET | Freq: Three times a day (TID) | ORAL | Status: DC

## 2012-06-26 MED ORDER — HEPARIN SOD (PORK) LOCK FLUSH 100 UNIT/ML IV SOLN
500.0000 [IU] | Freq: Once | INTRAVENOUS | Status: AC | PRN
  Administered 2012-06-26: 500 [IU]
  Filled 2012-06-26: qty 5

## 2012-06-26 MED ORDER — SODIUM CHLORIDE 0.9 % IJ SOLN
10.0000 mL | INTRAMUSCULAR | Status: DC | PRN
  Administered 2012-06-26: 10 mL
  Filled 2012-06-26: qty 10

## 2012-06-26 NOTE — Progress Notes (Signed)
Weekly Management Note:  Site:L Chestwall/Regional LNs Current Dose:  3240  cGy Projected Dose: 6040  cGy  Narrative: The patient is seen today for routine under treatment assessment. CBCT/MVCT images/port films were reviewed. The chart was reviewed.   She is without complaints today. She continues with her Radioplex gel. She missed yesterday his treatment because of work.  Physical Examination: There were no vitals filed for this visit..  Weight:  . There is hyperpigmentation of the skin along the left chest wall. The area of crusting along her lateral left mastectomy scar is no longer present. The scar is thickened.  Impression: Tolerating radiation therapy well. He previously noted crusting along her left mastectomy scar was probably from trauma.  Plan: Continue radiation therapy as planned. I again emphasized the need for her to come in for her treatments daily.

## 2012-06-26 NOTE — Patient Instructions (Signed)
[facility] Discharge Instructions for Patients Receiving Chemotherapy  Today you received the following chemotherapy agents Herceptin.  To help prevent nausea and vomiting after your treatment, we encourage you to take your nausea medication.   If you develop nausea and vomiting that is not controlled by your nausea medication, call the clinic. If it is after clinic hours your family physician or the after hours number for the clinic or go to the Emergency Department.   BELOW ARE SYMPTOMS THAT SHOULD BE REPORTED IMMEDIATELY:  *FEVER GREATER THAN 100.5 F  *CHILLS WITH OR WITHOUT FEVER  NAUSEA AND VOMITING THAT IS NOT CONTROLLED WITH YOUR NAUSEA MEDICATION  *UNUSUAL SHORTNESS OF BREATH  *UNUSUAL BRUISING OR BLEEDING  TENDERNESS IN MOUTH AND THROAT WITH OR WITHOUT PRESENCE OF ULCERS  *URINARY PROBLEMS  *BOWEL PROBLEMS  UNUSUAL RASH Items with * indicate a potential emergency and should be followed up as soon as possible.  One of the nurses will contact you 24 hours after your treatment. Please let the nurse know about any problems that you may have experienced. Feel free to call the clinic you have any questions or concerns. The clinic phone number is [phone].   I have been informed and understand all the instructions given to me. I know to contact the clinic, my physician, or go to the Emergency Department if any problems should occur. I do not have any questions at this time, but understand that I may call the clinic during office hours   should I have any questions or need assistance in obtaining follow up care.    __________________________________________  _____________  __________ Signature of Patient or Authorized Representative            Date                   Time    __________________________________________ Nurse's Signature    

## 2012-06-26 NOTE — Progress Notes (Signed)
Pt was seen in treatment area by Dr Dayton Scrape. Pt had appt, could not come into nursing dept.

## 2012-06-27 ENCOUNTER — Ambulatory Visit
Admission: RE | Admit: 2012-06-27 | Discharge: 2012-06-27 | Disposition: A | Discharge status: Home / Self Care | Payer: Medicaid Other | Source: Ambulatory Visit | Attending: Radiation Oncology | Admitting: Radiation Oncology

## 2012-06-27 ENCOUNTER — Encounter: Payer: Self-pay | Admitting: Oncology

## 2012-06-27 ENCOUNTER — Telehealth: Payer: Self-pay | Admitting: *Deleted

## 2012-06-27 NOTE — Progress Notes (Signed)
Patient came in today for an appt,she seen me in the lobby talking with a patient,and when I got done she wanted to see me.I did pull her to the side and I talk to her about the [facility] fund.I told her that we have already given her 4 gas cards,and I told her that that's it,and then she ask me about the fund I told her its a one time fund so once your funds are gone that's it,and she understood.I told her to call [facility] she has the number she said that she will give them a call.

## 2012-06-27 NOTE — Telephone Encounter (Signed)
This RN left VM on [name]'s nimber per pt's message stating need to pick up her medical records tomorrow to use for her disability case.

## 2012-06-28 ENCOUNTER — Ambulatory Visit: Payer: Medicaid Other | Attending: Radiation Oncology | Admitting: Physical Therapy

## 2012-06-28 ENCOUNTER — Ambulatory Visit
Admission: RE | Admit: 2012-06-28 | Discharge: 2012-06-28 | Disposition: A | Discharge status: Home / Self Care | Payer: Medicaid Other | Source: Ambulatory Visit | Attending: Radiation Oncology | Admitting: Radiation Oncology

## 2012-06-28 DIAGNOSIS — IMO0001 Reserved for inherently not codable concepts without codable children: Secondary | ICD-10-CM | POA: Insufficient documentation

## 2012-06-28 DIAGNOSIS — I890 Lymphedema, not elsewhere classified: Secondary | ICD-10-CM | POA: Insufficient documentation

## 2012-06-28 DIAGNOSIS — M24519 Contracture, unspecified shoulder: Secondary | ICD-10-CM | POA: Insufficient documentation

## 2012-06-28 DIAGNOSIS — M6281 Muscle weakness (generalized): Secondary | ICD-10-CM | POA: Insufficient documentation

## 2012-06-28 DIAGNOSIS — C50919 Malignant neoplasm of unspecified site of unspecified female breast: Secondary | ICD-10-CM | POA: Insufficient documentation

## 2012-06-29 ENCOUNTER — Ambulatory Visit: Payer: Medicaid Other

## 2012-06-30 ENCOUNTER — Ambulatory Visit: Payer: Medicaid Other

## 2012-06-30 ENCOUNTER — Encounter (INDEPENDENT_AMBULATORY_CARE_PROVIDER_SITE_OTHER): Payer: Self-pay | Admitting: General Surgery

## 2012-07-03 ENCOUNTER — Other Ambulatory Visit: Payer: Self-pay | Admitting: Lab

## 2012-07-03 ENCOUNTER — Ambulatory Visit: Payer: Medicaid Other

## 2012-07-03 ENCOUNTER — Ambulatory Visit: Payer: Self-pay

## 2012-07-04 ENCOUNTER — Ambulatory Visit
Admission: RE | Admit: 2012-07-04 | Discharge: 2012-07-04 | Disposition: A | Discharge status: Home / Self Care | Payer: Medicaid Other | Source: Ambulatory Visit | Attending: Radiation Oncology | Admitting: Radiation Oncology

## 2012-07-04 ENCOUNTER — Encounter: Payer: Self-pay | Admitting: Radiation Oncology

## 2012-07-04 VITALS — BP 111/76 | HR 69 | Temp 97.4°F | Resp 20 | Wt 218.7 lb

## 2012-07-04 DIAGNOSIS — C50919 Malignant neoplasm of unspecified site of unspecified female breast: Secondary | ICD-10-CM

## 2012-07-04 NOTE — Progress Notes (Signed)
Weekly Management Note Current Dose: 37.8  Gy  Projected Dose: 60.4 Gy   Narrative:  The patient presents for routine under treatment assessment.  CBCT/MVCT images/Port film x-rays were reviewed.  The chart was checked. Reports for treatment having missed several.  Needs to reschedule PT for lymphedema.  Moving and unstable transportation. No pain.   Physical Findings: Weight: 218 lb 11.2 oz (99.202 kg). Slightly dark left chest wall. Left hand/arm lymphedema  Impression:  The patient is tolerating radiation.  Plan:  Continue treatment as planned. SW referral for resources. Continue radiaplex. Encouraged her to f/u with PT

## 2012-07-04 NOTE — Progress Notes (Signed)
Pt denies pain except occasionally in her left hand due to lymphedema. She sees therapist regularly for this, last seen 06/28/12. Pt does have some swelling of left hand today.  Beginning to experience fatigue, denies loss of appetite. Pt applying Radiaplex to left chest wall/axilla, has few areas of dry desquamation, hyperpigmentation. Pt request referral for smoking cessation information; will make referral to SW today.

## 2012-07-05 ENCOUNTER — Ambulatory Visit
Admission: RE | Admit: 2012-07-05 | Discharge: 2012-07-05 | Disposition: A | Discharge status: Home / Self Care | Payer: Medicaid Other | Source: Ambulatory Visit | Attending: Radiation Oncology | Admitting: Radiation Oncology

## 2012-07-05 ENCOUNTER — Other Ambulatory Visit (HOSPITAL_COMMUNITY): Payer: Self-pay

## 2012-07-06 ENCOUNTER — Ambulatory Visit: Payer: Medicaid Other

## 2012-07-07 ENCOUNTER — Ambulatory Visit: Payer: Medicaid Other

## 2012-07-07 ENCOUNTER — Encounter (INDEPENDENT_AMBULATORY_CARE_PROVIDER_SITE_OTHER): Payer: Self-pay | Admitting: General Surgery

## 2012-07-10 ENCOUNTER — Ambulatory Visit: Payer: Medicaid Other | Admitting: Radiation Oncology

## 2012-07-10 ENCOUNTER — Ambulatory Visit: Payer: Medicaid Other

## 2012-07-11 ENCOUNTER — Encounter: Payer: Self-pay | Admitting: Radiation Oncology

## 2012-07-11 ENCOUNTER — Ambulatory Visit
Admission: RE | Admit: 2012-07-11 | Discharge: 2012-07-11 | Disposition: A | Discharge status: Home / Self Care | Payer: Medicaid Other | Source: Ambulatory Visit | Admitting: Radiation Oncology

## 2012-07-11 ENCOUNTER — Ambulatory Visit
Admission: RE | Admit: 2012-07-11 | Discharge: 2012-07-11 | Disposition: A | Discharge status: Home / Self Care | Payer: Medicaid Other | Source: Ambulatory Visit | Attending: Radiation Oncology | Admitting: Radiation Oncology

## 2012-07-11 VITALS — BP 126/73 | HR 78 | Temp 97.0°F | Resp 18 | Wt 223.5 lb

## 2012-07-11 DIAGNOSIS — C50919 Malignant neoplasm of unspecified site of unspecified female breast: Secondary | ICD-10-CM

## 2012-07-11 NOTE — Progress Notes (Signed)
Patient presents to the clinic today unaccompanied for an under treat visit with Dr. Dayton Scrape. Patient is alert and oriented to person, place, and time. No distress noted. Steady gait noted. Pleasant affect noted. Patient denies pain at this time. Hyperpigmentation without desquamation to left chest wall and axilla noted. Patient reports using Radiaplex as directed. Lymphedema of left arm and hand noted but, patient reports she is scheduled to begin PT tomorrow twice a week for 4-6 weeks. Reported all findings to Dr. Dayton Scrape.

## 2012-07-11 NOTE — Progress Notes (Signed)
Weekly Management Note:  Site:L Chestwall/regional LNs Current Dose:  4140  cGy Projected Dose: 6140  cGy  Narrative: The patient is seen today for routine under treatment assessment. CBCT/MVCT images/port films were reviewed. The chart was reviewed.   She continues to miss treatments because of personal issues. Transportation does not seem to be a problem. She uses his Radioplex gel. She is finally getting scheduled for her lymphedema therapy which has been postponed by the patient. Physical Examination:  Filed Vitals:   07/11/12 1908  BP: 126/73  Pulse: 78  Temp: 97 F (36.1 C)  Resp: 18  .  Weight: 223 lb 8 oz (101.379 kg). There is marked hyperpigmentation the skin along the left chest wall with patchy dry desquamation. She is 1+ lymphedema of the left upper extremity .  Impression: Tolerating radiation therapy well. I again emphasized the need for her to come in for daily treatments to maximize the benefit of radiation therapy.  Plan: Continue radiation therapy as planned. She may need additional treatments based on the number of visits that she missed.

## 2012-07-12 ENCOUNTER — Ambulatory Visit: Payer: Medicaid Other

## 2012-07-13 ENCOUNTER — Other Ambulatory Visit: Payer: Self-pay | Admitting: *Deleted

## 2012-07-13 ENCOUNTER — Ambulatory Visit: Payer: Medicaid Other

## 2012-07-14 ENCOUNTER — Other Ambulatory Visit: Payer: Self-pay | Admitting: Physician Assistant

## 2012-07-14 ENCOUNTER — Ambulatory Visit
Admission: RE | Admit: 2012-07-14 | Discharge: 2012-07-14 | Disposition: A | Discharge status: Home / Self Care | Payer: Medicaid Other | Source: Ambulatory Visit | Attending: Radiation Oncology | Admitting: Radiation Oncology

## 2012-07-14 ENCOUNTER — Other Ambulatory Visit: Payer: Self-pay | Admitting: Oncology

## 2012-07-14 DIAGNOSIS — C50419 Malignant neoplasm of upper-outer quadrant of unspecified female breast: Secondary | ICD-10-CM

## 2012-07-17 ENCOUNTER — Encounter: Payer: Self-pay | Admitting: Radiation Oncology

## 2012-07-17 ENCOUNTER — Ambulatory Visit (HOSPITAL_BASED_OUTPATIENT_CLINIC_OR_DEPARTMENT_OTHER): Payer: Self-pay | Admitting: Oncology

## 2012-07-17 ENCOUNTER — Ambulatory Visit: Payer: Self-pay

## 2012-07-17 ENCOUNTER — Ambulatory Visit
Admission: RE | Admit: 2012-07-17 | Discharge: 2012-07-17 | Disposition: A | Discharge status: Home / Self Care | Payer: Medicaid Other | Source: Ambulatory Visit | Attending: Radiation Oncology | Admitting: Radiation Oncology

## 2012-07-17 ENCOUNTER — Other Ambulatory Visit (HOSPITAL_BASED_OUTPATIENT_CLINIC_OR_DEPARTMENT_OTHER): Payer: Self-pay | Admitting: Lab

## 2012-07-17 VITALS — BP 114/75 | HR 64 | Temp 97.0°F | Resp 20 | Wt 224.3 lb

## 2012-07-17 DIAGNOSIS — Z170 Estrogen receptor positive status [ER+]: Secondary | ICD-10-CM

## 2012-07-17 DIAGNOSIS — C50919 Malignant neoplasm of unspecified site of unspecified female breast: Secondary | ICD-10-CM

## 2012-07-17 DIAGNOSIS — M79609 Pain in unspecified limb: Secondary | ICD-10-CM

## 2012-07-17 DIAGNOSIS — I2510 Atherosclerotic heart disease of native coronary artery without angina pectoris: Secondary | ICD-10-CM

## 2012-07-17 DIAGNOSIS — C773 Secondary and unspecified malignant neoplasm of axilla and upper limb lymph nodes: Secondary | ICD-10-CM

## 2012-07-17 LAB — CBC WITH DIFFERENTIAL/PLATELET
BASO%: 0.2 % (ref 0.0–2.0)
HCT: 39.3 % (ref 34.8–46.6)
LYMPH%: 21.6 % (ref 14.0–49.7)
MCH: 30.2 pg (ref 25.1–34.0)
MCHC: 34.1 g/dL (ref 31.5–36.0)
MCV: 88.7 fL (ref 79.5–101.0)
MONO#: 0.3 10*3/uL (ref 0.1–0.9)
MONO%: 7 % (ref 0.0–14.0)
NEUT%: 69 % (ref 38.4–76.8)
Platelets: 333 10*3/uL (ref 145–400)
RBC: 4.43 10*6/uL (ref 3.70–5.45)
WBC: 4.2 10*3/uL (ref 3.9–10.3)
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
## 2012-07-17 NOTE — Progress Notes (Signed)
At around 2pm, Radiation oncology called, pt.  has to be out by 3:15 pm today. Pt. Scheduled at 2:30pm. Pt. Unable to stay. Will reschedule pt. For Wednesday.  Michelle Wheat aware to call pt. Once schedule is done and [employer], notified of above to have Dr. Darnelle Catalan changed date of order.  HL

## 2012-07-17 NOTE — Progress Notes (Signed)
Weekly Management Note:  Site:L Chestwall/Regional LNs Current Dose:  4360  cGy Projected Dose: 6040  cGy  Narrative: The patient is seen today for routine under treatment assessment. CBCT/MVCT images/port films were reviewed. The chart was reviewed.   She continues to miss numerous scheduled treatments because of her sleeping and been preoccupied with her job. She uses Radioplex gel. She states that her treatment is less effective because of her missed treatments. She is scheduled for more chemotherapy with Dr. Darnelle Catalan this Wednesday.  Physical Examination:  Filed Vitals:   07/17/12 1426  BP: 114/75  Pulse: 64  Temp: 97 F (36.1 C)  Resp: 20  .  Weight: 224 lb 4.8 oz (101.742 kg). There is hyperpigmentation of the skin along with patchy dry desquamation along her left chest wall/axilla.  Labs:  Lab Results  Component Value Date   WBC 4.2 07/17/2012   HGB 13.4 07/17/2012   HCT 39.3 07/17/2012   MCV 88.7 07/17/2012   PLT 333 07/17/2012    Impression: Tolerating radiation therapy well, however, her treatment is less effective because of her poor compliance.  Plan: Continue radiation therapy as planned. We may have to add extra treatments because of her treatment delay/duration.

## 2012-07-17 NOTE — Progress Notes (Signed)
Pt c/o bilat arthritic knee pain today, slight discomfort/itchiness in left axilla. Gave pt Biafine lotion to apply BID. Hyperpigmentation, dry desquamation left chest wall, axilla. Pt applying Neosporin to very small area left chest wall where she'd had open area in past. Pt states it is not draining. Pt begins PT for left arm lymphedema on 07/19/12. Has moderate swelling of left arm/hand today. Fatigued; pt has been moving, unpacking.  Not treated yet today.

## 2012-07-18 ENCOUNTER — Encounter: Payer: Self-pay | Admitting: Oncology

## 2012-07-18 ENCOUNTER — Ambulatory Visit (HOSPITAL_BASED_OUTPATIENT_CLINIC_OR_DEPARTMENT_OTHER): Payer: Self-pay | Admitting: Oncology

## 2012-07-18 ENCOUNTER — Other Ambulatory Visit: Payer: Self-pay | Admitting: Oncology

## 2012-07-18 ENCOUNTER — Ambulatory Visit
Admission: RE | Admit: 2012-07-18 | Discharge: 2012-07-18 | Disposition: A | Discharge status: Home / Self Care | Payer: Medicaid Other | Source: Ambulatory Visit | Attending: Radiation Oncology | Admitting: Radiation Oncology

## 2012-07-18 VITALS — BP 131/84 | HR 74 | Temp 98.3°F | Ht 65.0 in | Wt 223.5 lb

## 2012-07-18 DIAGNOSIS — Z170 Estrogen receptor positive status [ER+]: Secondary | ICD-10-CM

## 2012-07-18 DIAGNOSIS — M79606 Pain in leg, unspecified: Secondary | ICD-10-CM

## 2012-07-18 DIAGNOSIS — C50919 Malignant neoplasm of unspecified site of unspecified female breast: Secondary | ICD-10-CM

## 2012-07-18 DIAGNOSIS — F17200 Nicotine dependence, unspecified, uncomplicated: Secondary | ICD-10-CM

## 2012-07-18 DIAGNOSIS — C773 Secondary and unspecified malignant neoplasm of axilla and upper limb lymph nodes: Secondary | ICD-10-CM

## 2012-07-18 MED ORDER — TAMOXIFEN CITRATE 20 MG PO TABS
20.0000 mg | ORAL_TABLET | Freq: Every day | ORAL | Status: DC
Start: 2012-07-18 — End: 2012-07-19

## 2012-07-18 MED ORDER — METHADONE HCL 5 MG PO TABS: 15.0000 mg | ORAL_TABLET | Freq: Three times a day (TID) | ORAL | Status: DC

## 2012-07-18 NOTE — Progress Notes (Signed)
Patient came by today asking about how much she had left from funds. [name] and I talked with patient letting her know that her medical bills would be taken care of but when she runs out of funs that she will have to pay for medications. Patient stated that she has a hearing for disability. Told patient that as of right now she is okay she has about 100.00 left in funds for medicine.
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
## 2012-07-19 ENCOUNTER — Other Ambulatory Visit: Payer: Self-pay

## 2012-07-19 ENCOUNTER — Encounter (HOSPITAL_COMMUNITY): Payer: Self-pay

## 2012-07-19 ENCOUNTER — Telehealth: Payer: Self-pay | Admitting: *Deleted

## 2012-07-19 ENCOUNTER — Ambulatory Visit: Payer: Medicaid Other

## 2012-07-19 ENCOUNTER — Observation Stay (HOSPITAL_COMMUNITY)
Admission: EM | Admit: 2012-07-19 | Discharge: 2012-07-21 | Disposition: A | Discharge status: Home / Self Care | Payer: Medicaid Other | Attending: Internal Medicine | Admitting: Internal Medicine

## 2012-07-19 ENCOUNTER — Emergency Department (HOSPITAL_COMMUNITY): Payer: Medicaid Other

## 2012-07-19 ENCOUNTER — Ambulatory Visit: Payer: Self-pay

## 2012-07-19 DIAGNOSIS — D696 Thrombocytopenia, unspecified: Secondary | ICD-10-CM

## 2012-07-19 DIAGNOSIS — D709 Neutropenia, unspecified: Secondary | ICD-10-CM | POA: Insufficient documentation

## 2012-07-19 DIAGNOSIS — M79606 Pain in leg, unspecified: Secondary | ICD-10-CM

## 2012-07-19 DIAGNOSIS — Z79899 Other long term (current) drug therapy: Secondary | ICD-10-CM | POA: Insufficient documentation

## 2012-07-19 DIAGNOSIS — R509 Fever, unspecified: Secondary | ICD-10-CM | POA: Insufficient documentation

## 2012-07-19 DIAGNOSIS — I700 Atherosclerosis of aorta: Secondary | ICD-10-CM | POA: Diagnosis present

## 2012-07-19 DIAGNOSIS — R197 Diarrhea, unspecified: Secondary | ICD-10-CM | POA: Insufficient documentation

## 2012-07-19 DIAGNOSIS — I2510 Atherosclerotic heart disease of native coronary artery without angina pectoris: Principal | ICD-10-CM | POA: Insufficient documentation

## 2012-07-19 DIAGNOSIS — R0789 Other chest pain: Secondary | ICD-10-CM | POA: Insufficient documentation

## 2012-07-19 DIAGNOSIS — M1990 Unspecified osteoarthritis, unspecified site: Secondary | ICD-10-CM

## 2012-07-19 DIAGNOSIS — D649 Anemia, unspecified: Secondary | ICD-10-CM | POA: Insufficient documentation

## 2012-07-19 DIAGNOSIS — E785 Hyperlipidemia, unspecified: Secondary | ICD-10-CM | POA: Insufficient documentation

## 2012-07-19 DIAGNOSIS — R079 Chest pain, unspecified: Secondary | ICD-10-CM | POA: Diagnosis present

## 2012-07-19 DIAGNOSIS — I10 Essential (primary) hypertension: Secondary | ICD-10-CM | POA: Diagnosis present

## 2012-07-19 DIAGNOSIS — Z01810 Encounter for preprocedural cardiovascular examination: Secondary | ICD-10-CM

## 2012-07-19 DIAGNOSIS — C50919 Malignant neoplasm of unspecified site of unspecified female breast: Secondary | ICD-10-CM | POA: Diagnosis present

## 2012-07-19 DIAGNOSIS — E876 Hypokalemia: Secondary | ICD-10-CM | POA: Diagnosis present

## 2012-07-19 DIAGNOSIS — IMO0002 Reserved for concepts with insufficient information to code with codable children: Secondary | ICD-10-CM | POA: Diagnosis present

## 2012-07-19 DIAGNOSIS — I059 Rheumatic mitral valve disease, unspecified: Secondary | ICD-10-CM | POA: Insufficient documentation

## 2012-07-19 DIAGNOSIS — I517 Cardiomegaly: Secondary | ICD-10-CM | POA: Insufficient documentation

## 2012-07-19 DIAGNOSIS — M79609 Pain in unspecified limb: Secondary | ICD-10-CM | POA: Insufficient documentation

## 2012-07-19 DIAGNOSIS — R9430 Abnormal result of cardiovascular function study, unspecified: Secondary | ICD-10-CM

## 2012-07-19 DIAGNOSIS — R5381 Other malaise: Secondary | ICD-10-CM

## 2012-07-19 DIAGNOSIS — G8929 Other chronic pain: Secondary | ICD-10-CM | POA: Insufficient documentation

## 2012-07-19 HISTORY — DX: Other chronic pain: G89.29

## 2012-07-19 LAB — POCT I-STAT TROPONIN I: Troponin i, poc: 0 ng/mL (ref 0.00–0.08)

## 2012-07-19 LAB — BASIC METABOLIC PANEL
CO2: 24 mEq/L (ref 19–32)
Calcium: 8.9 mg/dL (ref 8.4–10.5)
Creatinine, Ser: 1.07 mg/dL (ref 0.50–1.10)
Glucose, Bld: 103 mg/dL — ABNORMAL HIGH (ref 70–99)

## 2012-07-19 LAB — CARDIAC PANEL(CRET KIN+CKTOT+MB+TROPI)
CK, MB: 1.4 ng/mL (ref 0.3–4.0)
Total CK: 88 U/L (ref 7–177)

## 2012-07-19 LAB — CBC
Hemoglobin: 13 g/dL (ref 12.0–15.0)
MCH: 30.5 pg (ref 26.0–34.0)
MCV: 89 fL (ref 78.0–100.0)
Platelets: 283 10*3/uL (ref 150–400)
RBC: 4.26 MIL/uL (ref 3.87–5.11)

## 2012-07-19 MED ORDER — NITROGLYCERIN 2 % TD OINT
0.5000 [in_us] | TOPICAL_OINTMENT | Freq: Four times a day (QID) | TRANSDERMAL | Status: DC
  Administered 2012-07-19 – 2012-07-21 (×7): 0.5 [in_us] via TOPICAL
  Filled 2012-07-19: qty 30

## 2012-07-19 MED ORDER — AMLODIPINE BESYLATE 5 MG PO TABS
5.0000 mg | ORAL_TABLET | Freq: Every day | ORAL | Status: DC
  Administered 2012-07-20 – 2012-07-21 (×2): 5 mg via ORAL
  Filled 2012-07-19 (×2): qty 1

## 2012-07-19 MED ORDER — ADULT MULTIVITAMIN W/MINERALS CH
1.0000 | ORAL_TABLET | Freq: Every day | ORAL | Status: DC
  Administered 2012-07-19 – 2012-07-21 (×3): 1 via ORAL
  Filled 2012-07-19 (×3): qty 1

## 2012-07-19 MED ORDER — ONDANSETRON HCL 4 MG PO TABS: 4.0000 mg | ORAL_TABLET | Freq: Four times a day (QID) | ORAL | Status: DC | PRN

## 2012-07-19 MED ORDER — MULTI-VITAMIN/MINERALS PO TABS: 1.0000 | ORAL_TABLET | Freq: Every day | ORAL | Status: DC

## 2012-07-19 MED ORDER — TRIAMTERENE-HCTZ 37.5-25 MG PO TABS
1.0000 | ORAL_TABLET | Freq: Every day | ORAL | Status: DC
  Administered 2012-07-20 – 2012-07-21 (×2): 1 via ORAL
  Filled 2012-07-19 (×2): qty 1

## 2012-07-19 MED ORDER — CLOPIDOGREL BISULFATE 75 MG PO TABS
75.0000 mg | ORAL_TABLET | Freq: Every day | ORAL | Status: DC
  Administered 2012-07-20: 75 mg via ORAL
  Filled 2012-07-19 (×2): qty 1

## 2012-07-19 MED ORDER — VITAMIN B-6 50 MG PO TABS
50.0000 mg | ORAL_TABLET | Freq: Every day | ORAL | Status: DC
  Administered 2012-07-19 – 2012-07-21 (×3): 50 mg via ORAL
  Filled 2012-07-19 (×3): qty 1

## 2012-07-19 MED ORDER — GUAIFENESIN-DM 100-10 MG/5ML PO SYRP
5.0000 mL | ORAL_SOLUTION | ORAL | Status: DC | PRN
  Administered 2012-07-20: 5 mL via ORAL
  Filled 2012-07-19: qty 10

## 2012-07-19 MED ORDER — METOPROLOL TARTRATE 100 MG PO TABS
100.0000 mg | ORAL_TABLET | Freq: Every morning | ORAL | Status: DC
  Administered 2012-07-20 – 2012-07-21 (×2): 100 mg via ORAL
  Filled 2012-07-19 (×2): qty 1

## 2012-07-19 MED ORDER — SODIUM CHLORIDE 0.9 % IJ SOLN: 3.0000 mL | Freq: Two times a day (BID) | INTRAMUSCULAR | Status: DC

## 2012-07-19 MED ORDER — NITROGLYCERIN 2 % TD OINT
1.0000 [in_us] | TOPICAL_OINTMENT | Freq: Once | TRANSDERMAL | Status: AC
  Administered 2012-07-19: 1 [in_us] via TOPICAL
  Filled 2012-07-19: qty 30

## 2012-07-19 MED ORDER — LIDOCAINE-PRILOCAINE 2.5-2.5 % EX CREA
TOPICAL_CREAM | CUTANEOUS | Status: DC | PRN
  Filled 2012-07-19: qty 5

## 2012-07-19 MED ORDER — IRBESARTAN 75 MG PO TABS
75.0000 mg | ORAL_TABLET | Freq: Every day | ORAL | Status: DC
  Administered 2012-07-20 – 2012-07-21 (×2): 75 mg via ORAL
  Filled 2012-07-19 (×2): qty 1

## 2012-07-19 MED ORDER — POTASSIUM CHLORIDE CRYS ER 20 MEQ PO TBCR
20.0000 meq | EXTENDED_RELEASE_TABLET | Freq: Two times a day (BID) | ORAL | Status: DC
  Administered 2012-07-19: 20 meq via ORAL
  Filled 2012-07-19 (×4): qty 1

## 2012-07-19 MED ORDER — ALBUTEROL SULFATE (5 MG/ML) 0.5% IN NEBU: 2.5000 mg | INHALATION_SOLUTION | RESPIRATORY_TRACT | Status: DC | PRN

## 2012-07-19 MED ORDER — HEPARIN SODIUM (PORCINE) 5000 UNIT/ML IJ SOLN
5000.0000 [IU] | Freq: Three times a day (TID) | INTRAMUSCULAR | Status: DC
  Administered 2012-07-19 – 2012-07-21 (×5): 5000 [IU] via SUBCUTANEOUS
  Filled 2012-07-19 (×8): qty 1

## 2012-07-19 MED ORDER — METHADONE HCL 5 MG PO TABS
15.0000 mg | ORAL_TABLET | Freq: Three times a day (TID) | ORAL | Status: DC
  Filled 2012-07-19: qty 1

## 2012-07-19 MED ORDER — ASPIRIN 81 MG PO CHEW
324.0000 mg | CHEWABLE_TABLET | Freq: Once | ORAL | Status: AC
  Administered 2012-07-19: 81 mg via ORAL
  Filled 2012-07-19: qty 4

## 2012-07-19 MED ORDER — NITROGLYCERIN 0.4 MG SL SUBL: 0.4000 mg | SUBLINGUAL_TABLET | SUBLINGUAL | Status: DC | PRN

## 2012-07-19 MED ORDER — POTASSIUM CHLORIDE CRYS ER 20 MEQ PO TBCR
40.0000 meq | EXTENDED_RELEASE_TABLET | Freq: Once | ORAL | Status: AC
  Administered 2012-07-19: 40 meq via ORAL
  Filled 2012-07-19: qty 2

## 2012-07-19 MED ORDER — MORPHINE SULFATE 4 MG/ML IJ SOLN
4.0000 mg | Freq: Once | INTRAMUSCULAR | Status: AC
  Administered 2012-07-19: 4 mg via INTRAVENOUS
  Filled 2012-07-19: qty 1

## 2012-07-19 MED ORDER — ATORVASTATIN CALCIUM 20 MG PO TABS
20.0000 mg | ORAL_TABLET | Freq: Every evening | ORAL | Status: DC
  Administered 2012-07-20: 20 mg via ORAL
  Filled 2012-07-19 (×2): qty 1

## 2012-07-19 MED ORDER — SODIUM CHLORIDE 0.9 % IJ SOLN
10.0000 mL | INTRAMUSCULAR | Status: DC | PRN
  Administered 2012-07-21: 10 mL

## 2012-07-19 MED ORDER — ONDANSETRON HCL 4 MG/2ML IJ SOLN: 4.0000 mg | Freq: Four times a day (QID) | INTRAMUSCULAR | Status: DC | PRN

## 2012-07-19 MED ORDER — POTASSIUM CHLORIDE 10 MEQ/100ML IV SOLN
10.0000 meq | INTRAVENOUS | Status: AC
  Administered 2012-07-19 (×4): 10 meq via INTRAVENOUS
  Filled 2012-07-19 (×4): qty 100

## 2012-07-19 MED ORDER — ASPIRIN 325 MG PO TABS
325.0000 mg | ORAL_TABLET | Freq: Every day | ORAL | Status: DC
  Administered 2012-07-20 – 2012-07-21 (×2): 325 mg via ORAL
  Filled 2012-07-19 (×2): qty 1

## 2012-07-19 MED ORDER — HYDROCODONE-ACETAMINOPHEN 5-325 MG PO TABS: 1.0000 | ORAL_TABLET | ORAL | Status: DC | PRN

## 2012-07-19 MED ORDER — SODIUM CHLORIDE 0.9 % IV SOLN
1000.0000 mL | INTRAVENOUS | Status: DC
  Administered 2012-07-19: 1000 mL via INTRAVENOUS

## 2012-07-19 NOTE — Progress Notes (Signed)
[name], is a 51 y.o. female,   MRN: [id number]  -  DOB - 21-Feb-1961  Outpatient Primary MD for the patient is Dow Adolph, MD  in for    Chief Complaint  Patient presents with  . Chest Pain     Blood pressure 108/62, pulse 68, temperature 98.3 F (36.8 C), SpO2 95.00%.  Principal Problem:  *Chest pain Active Problems:  CAD (coronary artery disease)  Hypertension  Breast cancer  Hypokalemia   51 yo hx CAD, HTN, breast ca presents ED with CP with exertion. HX MI in 2009 with stents also undergoing radiation treatment for breast ca. Pain is left chest with radiating to arm with sOB . Trop neg in ED. EKG with non specific t wave no prolonged QT.   VSS, will admit to tele  Lesle Chris. Black, NP

## 2012-07-19 NOTE — ED Notes (Signed)
Pt states around midnight she vomited x 1 and had slight chest pain, then today went to rehab for lymphedema in L arm, once got to rehab broke out in sweat, became weak, light headed and chest pain. Chest pain 7/10. Pt does have a cardiac hx, MI in 2009 w/ 2 stents placed. Pt states she does feel short of breath and having numbness/tingling in feet. Pt also states had diarrhea x 1 at rehab. Pt has a hx of L breast CA, had a double mastectomy done. Pt placed on 2L [place] for sob. Pt on monitor.
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
## 2012-07-19 NOTE — ED Notes (Signed)
Patient reports that she was on her way to rehab and broke out in a cold sweat, became weak, nauseated and dizzy. Patient had a pervious MI 2009 and 2 stents placed.

## 2012-07-19 NOTE — ED Notes (Signed)
Patient arrived via triage with chest pain complaint. EKG done given to edp. EKG not automatically showing in Epic. EKG showing in muse. EKG office contacted. EKG manually filed in chart.

## 2012-07-19 NOTE — Telephone Encounter (Signed)
Patient's fiance called and canceled her appt for today. Patient in the ER. Desk RN notified..  JMW

## 2012-07-19 NOTE — Telephone Encounter (Signed)
Left voice message to inform the patient of the echo appointment 07-27-2012 at 11:00am
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
## 2012-07-20 ENCOUNTER — Ambulatory Visit
Admission: RE | Admit: 2012-07-20 | Discharge: 2012-07-20 | Disposition: A | Discharge status: Home / Self Care | Payer: Medicaid Other | Source: Ambulatory Visit | Attending: Radiation Oncology | Admitting: Radiation Oncology

## 2012-07-20 ENCOUNTER — Telehealth: Payer: Self-pay | Admitting: *Deleted

## 2012-07-20 ENCOUNTER — Encounter (HOSPITAL_COMMUNITY): Payer: Self-pay | Admitting: Nurse Practitioner

## 2012-07-20 DIAGNOSIS — I059 Rheumatic mitral valve disease, unspecified: Secondary | ICD-10-CM

## 2012-07-20 LAB — CBC
Hemoglobin: 12.5 g/dL (ref 12.0–15.0)
MCV: 90 fL (ref 78.0–100.0)
Platelets: 281 10*3/uL (ref 150–400)
RBC: 4.1 MIL/uL (ref 3.87–5.11)
WBC: 4 10*3/uL (ref 4.0–10.5)

## 2012-07-20 LAB — CARDIAC PANEL(CRET KIN+CKTOT+MB+TROPI)
Relative Index: INVALID (ref 0.0–2.5)
Relative Index: INVALID (ref 0.0–2.5)
Total CK: 83 U/L (ref 7–177)
Total CK: 80 U/L (ref 7–177)
Troponin I: <0.3 ng/mL (ref <0.30)

## 2012-07-20 LAB — POTASSIUM: Potassium: 3.8 meq/L (ref 3.5–5.1)

## 2012-07-20 LAB — BASIC METABOLIC PANEL
CO2: 24 mEq/L (ref 19–32)
Calcium: 8.8 mg/dL (ref 8.4–10.5)
Glucose, Bld: 111 mg/dL — ABNORMAL HIGH (ref 70–99)
Potassium: 3.3 mEq/L — ABNORMAL LOW (ref 3.5–5.1)
Sodium: 136 mEq/L (ref 135–145)

## 2012-07-20 MED ORDER — NITROGLYCERIN 0.4 MG SL SUBL: 0.4000 mg | SUBLINGUAL_TABLET | SUBLINGUAL | Status: DC | PRN

## 2012-07-20 MED ORDER — REGADENOSON 0.4 MG/5ML IV SOLN
0.4000 mg | Freq: Once | INTRAVENOUS | Status: AC
  Administered 2012-07-21: 0.4 mg via INTRAVENOUS
  Filled 2012-07-20: qty 5

## 2012-07-20 MED ORDER — POTASSIUM CHLORIDE CRYS ER 20 MEQ PO TBCR: 20.0000 meq | EXTENDED_RELEASE_TABLET | Freq: Three times a day (TID) | ORAL | Status: DC

## 2012-07-20 MED ORDER — POTASSIUM CHLORIDE 10 MEQ/100ML IV SOLN
10.0000 meq | INTRAVENOUS | Status: AC
  Administered 2012-07-20 (×2): 10 meq via INTRAVENOUS
  Filled 2012-07-20 (×2): qty 100

## 2012-07-20 MED ORDER — POTASSIUM CHLORIDE CRYS ER 20 MEQ PO TBCR
20.0000 meq | EXTENDED_RELEASE_TABLET | Freq: Two times a day (BID) | ORAL | Status: DC
  Administered 2012-07-20 – 2012-07-21 (×3): 20 meq via ORAL
  Filled 2012-07-20 (×4): qty 1

## 2012-07-20 MED ORDER — OXYCODONE-ACETAMINOPHEN 5-325 MG PO TABS
1.0000 | ORAL_TABLET | ORAL | Status: DC | PRN
  Administered 2012-07-20 – 2012-07-21 (×3): 1 via ORAL
  Filled 2012-07-20 (×3): qty 1

## 2012-07-20 MED ORDER — POTASSIUM CHLORIDE CRYS ER 20 MEQ PO TBCR
40.0000 meq | EXTENDED_RELEASE_TABLET | Freq: Once | ORAL | Status: AC
  Administered 2012-07-20: 40 meq via ORAL
  Filled 2012-07-20: qty 2

## 2012-07-20 NOTE — Progress Notes (Signed)
Talked to patient about follow up medical care; patient goes to [facility]/ Dr Norberto Sorenson, Dr Orson Ape - [facility] will be closing its service to the community July 26, 2012- information given to patient about other Health Care Providers such as the [facility], [facility], [facility], [facility] and the number for [facility] was given to the patient also; patient will decide where to go for medical care. Prescriptions are filled with the [facility]; patient has her green card from the [facility] and is able to purchase her medication anywhere from 0 - $3.00; Patient stated that she is doing ok at this time; B Lobbyist, BSN, [place].

## 2012-07-20 NOTE — Progress Notes (Signed)
CSW provided patient with Advance Directives packet as CSW received referral stating patient is requesting information on HCPOA/Living Will. CSW provided card as well for her to call when ready to complete.   Unice Bailey, LCSW [facility] Clinical Social Worker cell #: [phone]

## 2012-07-20 NOTE — Telephone Encounter (Signed)
Spoke w/Amanda RN for pt in room [phone] re: if pt can receive radiation tx today. RN states pt is for ECHO today, time unknown at this time and possible discharge. Informed RN pt's current radiation tx time is 1:20 pm today but may be able to be altered depending on pt's ECHO, d/c status. Marchelle Folks stated she would call this RN back as soon as she has information. Miranda RT, linac #3 notified.

## 2012-07-20 NOTE — Progress Notes (Signed)
Initial visit with pt in response to spiritual care consult.  Pt requesting prayer.    Chaplain provided support around pt's hospitalization and series of illnesses (heart attack, breast cancer).   Pt moving into new home with fiance.   Chaplain prayed with pt.   Follow up with pt tomorrow if she returns to [facility] after stress test.    Will continue to follow during admission as needed.  Please page for further support needs.    07/20/12 1800  Clinical Encounter Type  Visited With Patient  Visit Type Initial;Psychological support;Spiritual support;Social support  Referral From Nurse  Consult/Referral To Chaplain  Recommendations Page for further needs  Spiritual Encounters  Spiritual Needs Emotional;Prayer

## 2012-07-20 NOTE — Progress Notes (Signed)
*  PRELIMINARY RESULTS* Echocardiogram 2D Echocardiogram has been performed.  [name] 07/20/2012, 2:58 PM

## 2012-07-20 NOTE — Progress Notes (Signed)
Patient refused am scheduled Methadone.  Will continue to monitor patient. Manson Passey, Paloma [place] Cherie

## 2012-07-20 NOTE — Progress Notes (Signed)
Called [facility] at [phone], spoke with [facility].  Pt is scheduled for her Lexiscan at 0900 on 07/21/12. She is to remain NPO after midnight tonight.  Branch, [phone].  Pt is scheduled to be picked up at 0830 tomorrow morning for transport to [facility].  Pt and family notified.  Ardyth Gal, RN 07/20/2012

## 2012-07-20 NOTE — Progress Notes (Signed)
[facility] Radiation Oncology Dept Therapy Treatment Record Phone [phone]   Radiation Therapy was administered to [name] on: 07/20/2012  3:26 PM and was treatment #27 out of a planned course of 33 treatments.

## 2012-07-20 NOTE — Progress Notes (Signed)
[facility]                                                                                Patient Demographics  [name], is a 51 y.o. female  UJW:[id number]  FAO:[id number]  DOB - 1961/06/01  Admit date - 07/19/2012  Admitting Physician Zannie Cove, MD  Outpatient Primary MD for the patient is Dow Adolph, MD  LOS - 1   Chief Complaint  Patient presents with  . Chest Pain        Assessment & Plan    1. Left-sided chest pain in a patient with history of CAD status post STEMI, bare-metal stent to RCA - she is currently chest pain-free, her aspirin Plavix beta blocker and statin will be continued, will place her on nitro paste to keep her pain-free, she will be monitored on telemetry bed, troponins have been negative, echo is pending ( she is a breast cancer patient is undergoing radiation) to rule out wall motion abnormalities along with pericardial effusion , discussed the case with [facility] cardiologist on call Dr. Eldridge Dace who suggested to call [facility] cardiology as patient had recently switched to Dr. Toma Aran cardiology called. Doubt this is acute coronary syndrome however patient wants to see a cardiologist.   2. Hypokalemia - potassium will be replaced by mouth and IV and recheck again in the afternoon.    3. History of breast cancer- postmastectomy patient undergoing radiation treatments, have informed radiation oncology department about patient's inpatient status, theywill see the patient and take care of future radiation treatments.     4. Chronic pain- home methadone along with narcotics will be continued.     5. Hypertension and dyslipidemia - no acute issues home medications will be continued with outpatient risk factor modulation.     6. Chronic left arm lymphedema due to breast cancer - no acute issues patient will follow outpatient with lymphedema clinic as before.      DVT Prophylaxis  Heparin     Leroy Sea M.D on 07/20/2012 at 8:46 AM  Between 7am to 7pm - Pager - [phone]  After 7pm go to [email or web address] - password [email or web address]  And look for the night coverage person covering for me after hours  [facility] Office  [phone]    Subjective:   [name] today has, No headache, No chest pain, No abdominal pain - No Nausea, No new weakness tingling or numbness, No Cough - SOB.    Objective:   Filed Vitals:   07/19/12 1500 07/19/12 1740 07/19/12 2111 07/20/12 0614  BP: 108/62 117/85 124/79 155/94  Pulse: 68 68 64 74  Temp:  97.8 F (36.6 C) 97.4 F (36.3 C) 97.7 F (36.5 C)  TempSrc:  Oral Oral Oral  Resp:  18 18 18   Height:  5\' 5"  (1.651 m)    Weight:  100.6 kg (221 lb 12.5 oz)  100.29 kg (221 lb 1.6 oz)  SpO2: 95% 98% 97% 96%    Wt Readings from Last 3 Encounters:  07/20/12 100.29 kg (221 lb 1.6 oz)  07/18/12 101.379 kg (223 lb 8 oz)  07/17/12 101.742 kg (224 lb 4.8 oz)  Intake/Output Summary (Last 24 hours) at 07/20/12 0846 Last data filed at 07/20/12 0807  Gross per 24 hour  Intake    371 ml  Output    700 ml  Net   -329 ml    Exam Awake Alert, Oriented X 3, No new F.N deficits, Normal affect [place].AT,PERRAL Supple Neck,No JVD, No cervical lymphadenopathy appriciated.  Symmetrical Chest wall movement, Good air movement bilaterally, CTAB RRR,No Gallops,Rubs or new Murmurs, No Parasternal Heave +ve B.Sounds, Abd Soft, Non tender, No organomegaly appriciated, No rebound - guarding or rigidity. No Cyanosis, Clubbing or edema, No new Rash or bruise , chronic left arm lymphedema, right subclavian Port-A-Cath in place.   Data Review   CBC  Lab 07/20/12 0252 07/19/12 1200 07/17/12 1325  WBC 4.0 4.9 4.2  HGB 12.5 13.0 13.4  HCT 36.9 37.9 39.3  PLT 281 283 333  MCV 90.0 89.0 88.7  MCH 30.5 30.5 30.2  MCHC 33.9 34.3 34.1  RDW 13.9 13.7 13.6  LYMPHSABS -- -- 0.9  MONOABS -- -- 0.3  EOSABS -- -- 0.1  BASOSABS -- -- 0.0  BANDABS -- -- --     Chemistries   Lab 07/20/12 0252 07/19/12 1200  NA 136 136  K 3.3* 2.9*  CL 102 100  CO2 24 24  GLUCOSE 111* 103*  BUN 10 12  CREATININE 1.01 1.07  CALCIUM 8.8 8.9  MG -- --  AST -- --  ALT -- --  ALKPHOS -- --  BILITOT -- --   ------------------------------------------------------------------------------------------------------------------ estimated creatinine clearance is 79.9 ml/min (by C-G formula based on Cr of 1.01). ------------------------------------------------------------------------------------------------------------------ No results found for this basename: HGBA1C:2 in the last 72 hours ------------------------------------------------------------------------------------------------------------------ No results found for this basename: CHOL:2,HDL:2,LDLCALC:2,TRIG:2,CHOLHDL:2,LDLDIRECT:2 in the last 72 hours ------------------------------------------------------------------------------------------------------------------ No results found for this basename: TSH,T4TOTAL,FREET3,T3FREE,THYROIDAB in the last 72 hours ------------------------------------------------------------------------------------------------------------------ No results found for this basename: VITAMINB12:2,FOLATE:2,FERRITIN:2,TIBC:2,IRON:2,RETICCTPCT:2 in the last 72 hours  Coagulation profile  Lab 07/19/12 1200  INR 0.97  PROTIME --    No results found for this basename: DDIMER:2 in the last 72 hours  Cardiac Enzymes  Lab 07/20/12 0252 07/19/12 1900  CKMB 1.3 1.4  TROPONINI <0.30 <0.30  MYOGLOBIN -- --   ------------------------------------------------------------------------------------------------------------------ No components found with this basename: POCBNP:3  Micro Results No results found for this or any previous visit (from the past 240 hour(s)).  Radiology Reports Dg Chest Portable 1 View  07/19/2012  *RADIOLOGY REPORT*  Clinical Data: Chest pain and left arm pain.   History of breast carcinoma.  PORTABLE CHEST - 1 VIEW  Comparison: 03/30/2012  Findings: There is stable positioning of a right subclavian Port-A- Cath with the catheter tip located in the upper SVC.  The lungs show no evidence of focal consolidation or overt edema.  No pleural effusions are identified.  Stable mild cardiomegaly.  No bony lesions are seen.  IMPRESSION: No acute findings.  Stable mild cardiomegaly.  Original Report Authenticated By: Reola Calkins, M.D.    Scheduled Meds:   . amLODipine  5 mg Oral Daily  . aspirin  324 mg Oral Once  . aspirin  325 mg Oral Daily  . atorvastatin  20 mg Oral QPM  . clopidogrel  75 mg Oral Daily  . heparin  5,000 Units Subcutaneous Q8H  . irbesartan  75 mg Oral Daily  . methadone  15 mg Oral Q8H  . metoprolol  100 mg Oral q morning - 10a  .  morphine injection  4 mg Intravenous Once  .  multivitamin with minerals  1 tablet Oral Daily  . nitroGLYCERIN  0.5 inch Topical Q6H  . nitroGLYCERIN  1 inch Topical Once  . potassium chloride  10 mEq Intravenous Q1 Hr x 4  . potassium chloride  10 mEq Intravenous Q1 Hr x 2  . potassium chloride SA  20 mEq Oral BID  . potassium chloride  40 mEq Oral Once  . potassium chloride  40 mEq Oral Once  . pyridOXINE  50 mg Oral Daily  . sodium chloride  3 mL Intravenous Q12H  . triamterene-hydrochlorothiazide  1 each Oral Daily  . DISCONTD: multivitamin with minerals  1 tablet Oral Daily  . DISCONTD: potassium chloride SA  20 mEq Oral BID   Continuous Infusions:   . sodium chloride 1,000 mL (07/19/12 1227)   PRN Meds:.albuterol, guaiFENesin-dextromethorphan, HYDROcodone-acetaminophen, lidocaine-prilocaine, nitroGLYCERIN, ondansetron (ZOFRAN) IV, ondansetron, sodium chloride
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
## 2012-07-21 ENCOUNTER — Other Ambulatory Visit: Payer: Self-pay

## 2012-07-21 ENCOUNTER — Encounter (HOSPITAL_COMMUNITY)
Admit: 2012-07-21 | Discharge: 2012-07-21 | Disposition: A | Discharge status: Home / Self Care | Payer: Medicaid Other | Attending: Nurse Practitioner | Admitting: Nurse Practitioner

## 2012-07-21 ENCOUNTER — Ambulatory Visit (HOSPITAL_COMMUNITY)
Admit: 2012-07-21 | Discharge: 2012-07-21 | Disposition: A | Discharge status: Home / Self Care | Payer: Medicaid Other | Attending: Nurse Practitioner | Admitting: Nurse Practitioner

## 2012-07-21 ENCOUNTER — Telehealth: Payer: Self-pay | Admitting: Oncology

## 2012-07-21 ENCOUNTER — Ambulatory Visit
Admission: RE | Admit: 2012-07-21 | Discharge: 2012-07-21 | Disposition: A | Discharge status: Home / Self Care | Payer: Medicaid Other | Source: Ambulatory Visit | Attending: Radiation Oncology | Admitting: Radiation Oncology

## 2012-07-21 ENCOUNTER — Observation Stay (HOSPITAL_COMMUNITY)
Admit: 2012-07-21 | Discharge: 2012-07-21 | Disposition: A | Discharge status: Home / Self Care | Payer: Medicaid Other | Attending: Nurse Practitioner | Admitting: Nurse Practitioner

## 2012-07-21 DIAGNOSIS — R079 Chest pain, unspecified: Secondary | ICD-10-CM

## 2012-07-21 MED ORDER — TECHNETIUM TC 99M TETROFOSMIN IV KIT
10.0000 | PACK | Freq: Once | INTRAVENOUS | Status: AC | PRN
  Administered 2012-07-21: 10 via INTRAVENOUS

## 2012-07-21 MED ORDER — HEPARIN SOD (PORK) LOCK FLUSH 100 UNIT/ML IV SOLN
500.0000 [IU] | Freq: Once | INTRAVENOUS | Status: AC
  Administered 2012-07-21: 500 [IU] via INTRAVENOUS

## 2012-07-21 MED ORDER — TECHNETIUM TC 99M TETROFOSMIN IV KIT
30.0000 | PACK | Freq: Once | INTRAVENOUS | Status: AC | PRN
  Administered 2012-07-21: 30 via INTRAVENOUS

## 2012-07-21 NOTE — Progress Notes (Signed)
@   Subjective:  Denies CP or dyspnea   Objective:  Filed Vitals:   07/20/12 0614 07/20/12 1325 07/20/12 2200 07/21/12 0519  BP: 155/94 154/97 148/81 134/82  Pulse: 74 70 71 84  Temp: 97.7 F (36.5 C) 97.8 F (36.6 C) 98.3 F (36.8 C) 98.5 F (36.9 C)  TempSrc: Oral Oral Oral Oral  Resp: 18 18 20 20   Height:      Weight: 100.29 kg (221 lb 1.6 oz)   99.7 kg (219 lb 12.8 oz)  SpO2: 96% 91% 96% 95%    Intake/Output from previous day:  Intake/Output Summary (Last 24 hours) at 07/21/12 0618 Last data filed at 07/21/12 0524  Gross per 24 hour  Intake   1155 ml  Output   1750 ml  Net   -595 ml    Physical Exam: Physical exam: Well-developed well-nourished in no acute distress.  Skin is warm and dry.  HEENT is normal.  Neck is supple.  Chest is clear to auscultation with normal expansion.  Cardiovascular exam is regular rate and rhythm.  Abdominal exam nontender or distended. No masses palpated. Extremities show no edema. neuro grossly intact    Lab Results: Basic Metabolic Panel:  Basename 07/20/12 1220 07/20/12 0252 07/19/12 1200  NA -- 136 136  K 3.8 3.3* --  CL -- 102 100  CO2 -- 24 24  GLUCOSE -- 111* 103*  BUN -- 10 12  CREATININE -- 1.01 1.07  CALCIUM -- 8.8 8.9  MG -- -- --  PHOS -- -- --   CBC:  Basename 07/20/12 0252 07/19/12 1200  WBC 4.0 4.9  NEUTROABS -- --  HGB 12.5 13.0  HCT 36.9 37.9  MCV 90.0 89.0  PLT 281 283   Cardiac Enzymes:  Basename 07/20/12 1220 07/20/12 0252 07/19/12 1900  CKTOTAL 80 83 88  CKMB 1.4 1.3 1.4  CKMBINDEX -- -- --  TROPONINI <0.30 <0.30 <0.30     Assessment/Plan:  1) Chest pain - enzymes negative; for myoview today; if negative, patient can be dced from a cardiac standpoint and fu with Dr Myrtis Ser for cardiac issues. 2) CAD - Continue ASA but DC plavix; continue beta blocker and statin. 3) Hypertension - continue preadmission BP meds at DC 4) Hyperlipidemia - continue statin. 5) Hypokalemia - improved;  continue KCL at DC; she will need close fu of K with her primary care physician.  Olga Millers 07/21/2012, 6:18 AM
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
## 2012-07-21 NOTE — Telephone Encounter (Signed)
Returned pt's call re being d/c from hosp and wanting to r/s missed chemo. Pt informed that message was sent to desk nurse. And when orders are given for appt info we will contact her w/appt. Pt states she also lm for VAL re being d/c and needing to get back on schedule.

## 2012-07-24 ENCOUNTER — Ambulatory Visit: Payer: Self-pay

## 2012-07-24 ENCOUNTER — Ambulatory Visit: Payer: Medicaid Other

## 2012-07-24 NOTE — Telephone Encounter (Addendum)
Received Friday's voicemail from scheduler.  Noted patient received herceptin every three weeks.  Fiance cancelled the 07-19-2012 appointment because she was hospitalized.  Next herceptin is due and scheduled for [phone].  Will ask if she needs appointment needs to be rescheduled now or added later for make up.  Last herceprin received was on 06-26-2012.    Patient has been released from the hospital today.  Her cell number is [phone].

## 2012-07-25 ENCOUNTER — Encounter: Payer: Self-pay | Admitting: Radiation Oncology

## 2012-07-25 ENCOUNTER — Ambulatory Visit
Admission: RE | Admit: 2012-07-25 | Discharge: 2012-07-25 | Disposition: A | Discharge status: Home / Self Care | Payer: Medicaid Other | Source: Ambulatory Visit | Attending: Radiation Oncology | Admitting: Radiation Oncology

## 2012-07-25 VITALS — BP 103/67 | HR 70 | Temp 97.7°F | Resp 20 | Wt 222.3 lb

## 2012-07-25 DIAGNOSIS — R079 Chest pain, unspecified: Secondary | ICD-10-CM

## 2012-07-25 DIAGNOSIS — C50919 Malignant neoplasm of unspecified site of unspecified female breast: Secondary | ICD-10-CM

## 2012-07-25 NOTE — Progress Notes (Signed)
Pt has no c/o today, continues to apply Radiaplex to chest wall tx area. Denies loss of appetite, fatigue.

## 2012-07-25 NOTE — Progress Notes (Signed)
Weekly Management Note:  Site:L Chestwall/mastectomy scar boost Current Dose:  5240  cGy Projected Dose: 6040  cGy  Narrative: The patient is seen today for routine under treatment assessment. CBCT/MVCT images/port films were reviewed. The chart was reviewed.   No new complaints today. She continues to Miss treatments intermittently.  Physical Examination: There were no vitals filed for this visit..  Weight:  . There is hyperpigmentation the skin with patchy dry desquamation but no areas of moist desquamation.  Impression: Tolerating radiation therapy well.  Plan: Continue radiation therapy as planned. I will not add any additional treatments onto her prescription, and thus she will finish her treatment on Monday, August 12. I'll have her return for a one-month followup visit.

## 2012-07-26 ENCOUNTER — Telehealth: Payer: Self-pay | Admitting: Radiation Oncology

## 2012-07-26 ENCOUNTER — Ambulatory Visit: Payer: Medicaid Other | Attending: Radiation Oncology | Admitting: Physical Therapy

## 2012-07-26 ENCOUNTER — Ambulatory Visit
Admission: RE | Admit: 2012-07-26 | Discharge: 2012-07-26 | Disposition: A | Discharge status: Home / Self Care | Payer: Medicaid Other | Source: Ambulatory Visit | Attending: Radiation Oncology | Admitting: Radiation Oncology

## 2012-07-26 DIAGNOSIS — I890 Lymphedema, not elsewhere classified: Secondary | ICD-10-CM | POA: Insufficient documentation

## 2012-07-26 DIAGNOSIS — M6281 Muscle weakness (generalized): Secondary | ICD-10-CM | POA: Insufficient documentation

## 2012-07-26 DIAGNOSIS — C50919 Malignant neoplasm of unspecified site of unspecified female breast: Secondary | ICD-10-CM | POA: Insufficient documentation

## 2012-07-26 DIAGNOSIS — M24519 Contracture, unspecified shoulder: Secondary | ICD-10-CM | POA: Insufficient documentation

## 2012-07-26 DIAGNOSIS — IMO0001 Reserved for inherently not codable concepts without codable children: Secondary | ICD-10-CM | POA: Insufficient documentation

## 2012-07-26 NOTE — Telephone Encounter (Signed)
Pt just rtn'd MCD application today.  Sent via mail to:  [facility] [place] [address], [place], [place] [address] [phone]

## 2012-07-27 ENCOUNTER — Encounter (HOSPITAL_COMMUNITY): Payer: Self-pay

## 2012-07-27 ENCOUNTER — Ambulatory Visit (HOSPITAL_COMMUNITY): Payer: Medicaid Other

## 2012-07-27 ENCOUNTER — Encounter: Payer: Self-pay | Admitting: Radiation Oncology

## 2012-07-27 ENCOUNTER — Ambulatory Visit
Admission: RE | Admit: 2012-07-27 | Discharge: 2012-07-27 | Disposition: A | Discharge status: Home / Self Care | Payer: Medicaid Other | Source: Ambulatory Visit | Attending: Radiation Oncology | Admitting: Radiation Oncology

## 2012-07-27 NOTE — Progress Notes (Signed)
Electron beam simulation note: On July 25 the patient underwent electron beam simulation with virtual software. The patient was set up en face and one custom block was constructed to conform the field. A special port plan was requested. I prescribed 1000 cGy in 5 sessions utilizing 6 MEV electrons. 0.5 cm custom bolus was applied to the skin on the first day of her treatment on 07/25/2012.

## 2012-07-28 ENCOUNTER — Ambulatory Visit
Admission: RE | Admit: 2012-07-28 | Discharge: 2012-07-28 | Disposition: A | Discharge status: Home / Self Care | Payer: Medicaid Other | Source: Ambulatory Visit | Attending: Radiation Oncology | Admitting: Radiation Oncology

## 2012-07-28 ENCOUNTER — Encounter: Payer: Self-pay | Admitting: Physical Therapy

## 2012-07-31 ENCOUNTER — Ambulatory Visit: Payer: Medicaid Other

## 2012-07-31 ENCOUNTER — Encounter: Payer: Self-pay | Admitting: Radiation Oncology

## 2012-07-31 ENCOUNTER — Other Ambulatory Visit: Payer: Self-pay | Admitting: Oncology

## 2012-07-31 ENCOUNTER — Ambulatory Visit
Admission: RE | Admit: 2012-07-31 | Discharge: 2012-07-31 | Disposition: A | Discharge status: Home / Self Care | Payer: Medicaid Other | Source: Ambulatory Visit | Attending: Radiation Oncology | Admitting: Radiation Oncology

## 2012-07-31 VITALS — Temp 98.1°F | Resp 20 | Wt 224.5 lb

## 2012-07-31 DIAGNOSIS — C50919 Malignant neoplasm of unspecified site of unspecified female breast: Secondary | ICD-10-CM

## 2012-07-31 DIAGNOSIS — E876 Hypokalemia: Secondary | ICD-10-CM

## 2012-07-31 MED ORDER — RADIAPLEXRX EX GEL
Freq: Once | CUTANEOUS | Status: AC
  Administered 2012-07-31: 15:00:00 via TOPICAL

## 2012-07-31 NOTE — Progress Notes (Signed)
Pt going to lymphedema clinic 3 x/week for left arm therapy. Denies pain, fatigue, loss of appetite. Applying Radiaplex to  Left chest wall.

## 2012-07-31 NOTE — Progress Notes (Signed)
   Weekly Management Note Completed Radiotherapy. Total Dose: 60.4Gy   Narrative:  The patient presents for routine under treatment assessment on last day of radiotherapy.  CBCT/MVCT images/Port film x-rays were reviewed.  The chart was checked. She is doing relatively well. She is using radiaplex.  Physical Findings:  weight is 224 lb 8 oz (101.833 kg). Her oral temperature is 98.1 F (36.7 C). Her respiration is 20.  left chest wall is diffusely hyperpigmented with patchy dry desquamation but no moist desquamation.  Impression:  The patient has tolerated radiotherapy.  Plan:  Routine follow-up in one month. ________________________________   Lonie Peak, M.D.

## 2012-08-01 ENCOUNTER — Ambulatory Visit (HOSPITAL_COMMUNITY): Payer: Self-pay

## 2012-08-02 ENCOUNTER — Ambulatory Visit: Payer: Medicaid Other

## 2012-08-07 ENCOUNTER — Encounter: Payer: Self-pay | Admitting: Radiation Oncology

## 2012-08-07 ENCOUNTER — Ambulatory Visit (HOSPITAL_BASED_OUTPATIENT_CLINIC_OR_DEPARTMENT_OTHER): Payer: Self-pay

## 2012-08-07 ENCOUNTER — Encounter: Payer: Self-pay | Admitting: Physical Therapy

## 2012-08-07 ENCOUNTER — Other Ambulatory Visit (HOSPITAL_BASED_OUTPATIENT_CLINIC_OR_DEPARTMENT_OTHER): Payer: Self-pay | Admitting: Lab

## 2012-08-07 ENCOUNTER — Telehealth: Payer: Self-pay | Admitting: Emergency Medicine

## 2012-08-07 VITALS — BP 108/71 | HR 64 | Temp 97.2°F

## 2012-08-07 DIAGNOSIS — C50919 Malignant neoplasm of unspecified site of unspecified female breast: Secondary | ICD-10-CM

## 2012-08-07 DIAGNOSIS — C50119 Malignant neoplasm of central portion of unspecified female breast: Secondary | ICD-10-CM

## 2012-08-07 DIAGNOSIS — C773 Secondary and unspecified malignant neoplasm of axilla and upper limb lymph nodes: Secondary | ICD-10-CM

## 2012-08-07 DIAGNOSIS — Z5112 Encounter for antineoplastic immunotherapy: Secondary | ICD-10-CM

## 2012-08-07 DIAGNOSIS — E876 Hypokalemia: Secondary | ICD-10-CM

## 2012-08-07 DIAGNOSIS — Z170 Estrogen receptor positive status [ER+]: Secondary | ICD-10-CM

## 2012-08-07 DIAGNOSIS — I2510 Atherosclerotic heart disease of native coronary artery without angina pectoris: Secondary | ICD-10-CM

## 2012-08-07 LAB — CBC WITH DIFFERENTIAL/PLATELET
BASO%: 0.2 % (ref 0.0–2.0)
LYMPH%: 25 % (ref 14.0–49.7)
MCHC: 34.8 g/dL (ref 31.5–36.0)
MONO#: 0.4 10*3/uL (ref 0.1–0.9)
NEUT#: 3 10*3/uL (ref 1.5–6.5)
RBC: 4.74 10*6/uL (ref 3.70–5.45)
RDW: 14.3 % (ref 11.2–14.5)
WBC: 4.7 10*3/uL (ref 3.9–10.3)
lymph#: 1.2 10*3/uL (ref 0.9–3.3)
nRBC: 0 % (ref 0–0)

## 2012-08-07 MED ORDER — DIPHENHYDRAMINE HCL 25 MG PO CAPS
25.0000 mg | ORAL_CAPSULE | Freq: Once | ORAL | Status: AC
  Administered 2012-08-07: 25 mg via ORAL

## 2012-08-07 MED ORDER — ACETAMINOPHEN 325 MG PO TABS
650.0000 mg | ORAL_TABLET | Freq: Once | ORAL | Status: AC
  Administered 2012-08-07: 650 mg via ORAL

## 2012-08-07 MED ORDER — HEPARIN SOD (PORK) LOCK FLUSH 100 UNIT/ML IV SOLN
500.0000 [IU] | Freq: Once | INTRAVENOUS | Status: AC | PRN
  Administered 2012-08-07: 500 [IU]
  Filled 2012-08-07: qty 5

## 2012-08-07 MED ORDER — SODIUM CHLORIDE 0.9 % IV SOLN
Freq: Once | INTRAVENOUS | Status: AC
  Administered 2012-08-07: 14:00:00 via INTRAVENOUS

## 2012-08-07 MED ORDER — SODIUM CHLORIDE 0.9 % IJ SOLN
10.0000 mL | INTRAMUSCULAR | Status: DC | PRN
  Administered 2012-08-07: 10 mL
  Filled 2012-08-07: qty 10

## 2012-08-07 MED ORDER — TRASTUZUMAB CHEMO INJECTION 440 MG
6.0000 mg/kg | Freq: Once | INTRAVENOUS | Status: AC
  Administered 2012-08-07: 609 mg via INTRAVENOUS
  Filled 2012-08-07: qty 29

## 2012-08-07 NOTE — Progress Notes (Signed)
[facility] Radiation Oncology End of Treatment Note  Name:[name]  Date: 08/07/2012 WUJ:[id number] DOB:[birth year]   Status:outpatient    CC: Dow Adolph, MD    REFERRING PHYSICIAN: Dr. [name], [facility]    DIAGNOSIS: Pathologic stage IIIA (T3, N1, M0) invasive ductal carcinoma of the left breast , history of multiple foci of invasive lobular carcinoma (T1 N0) involving the right breast  INDICATION FOR TREATMENT: Curative   TREATMENT DATES: 05/11/2012 through 07/31/2012                          SITE/DOSE:  Left chest wall 5040 cGy 28 sessions, left supraclavicular/axillary region, 5040 cGy 28 sessions, left chest wall mastectomy scar boost 1000 cGy 5 sessions                          BEAMS/ENERGY:  Tangential fields to the left chest wall  utilizing 6 MV photons with forward planning. 1.0 cm custom bolus applied to her skin every other day to maximize the dose to the skin surface.    6 MV photons, RAO to the left supraclavicular/axillary region with a PA supplemental field to bring the mid axillary dose up to 4006 her centigray in 28 sessions.        6 MEV electrons, left mastectomy scar boost   NARRATIVE:  The patient tolerated treatment well, however she missed  treatment on numerous days for a variety of reasons, primarily conflicts with work and family..   It took 81 days to deliver 33 fractions of radiation therapy, clearly suboptimal.   I counseled the patient on numerous occasions regarding importance of her coming in for her scheduled treatments., but she still missed numerous treatments. By completion of therapy she had patchy dry desquamation the skin with no areas of moist desquamation.                   PLAN: Routine followup in one month. Patient instructed to call if questions or worsening complaints in interim.

## 2012-08-07 NOTE — Telephone Encounter (Signed)
While in the chemo area patient asking when to start her Tamoxifen.  This nurse called patient and instructed her to started the Tamoxifen today 08/07/12 per GM note from last office visit. Patient verbalized understanding.

## 2012-08-07 NOTE — Patient Instructions (Signed)
[facility] Discharge Instructions for Patients Receiving Chemotherapy  Today you received the following chemotherapy agents Herceptin.  To help prevent nausea and vomiting after your treatment, we encourage you to take your nausea medication.   If you develop nausea and vomiting that is not controlled by your nausea medication, call the clinic. If it is after clinic hours your family physician or the after hours number for the clinic or go to the Emergency Department.   BELOW ARE SYMPTOMS THAT SHOULD BE REPORTED IMMEDIATELY:  *FEVER GREATER THAN 100.5 F  *CHILLS WITH OR WITHOUT FEVER  NAUSEA AND VOMITING THAT IS NOT CONTROLLED WITH YOUR NAUSEA MEDICATION  *UNUSUAL SHORTNESS OF BREATH  *UNUSUAL BRUISING OR BLEEDING  TENDERNESS IN MOUTH AND THROAT WITH OR WITHOUT PRESENCE OF ULCERS  *URINARY PROBLEMS  *BOWEL PROBLEMS  UNUSUAL RASH Items with * indicate a potential emergency and should be followed up as soon as possible.  One of the nurses will contact you 24 hours after your treatment. Please let the nurse know about any problems that you may have experienced. Feel free to call the clinic you have any questions or concerns. The clinic phone number is [phone].   I have been informed and understand all the instructions given to me. I know to contact the clinic, my physician, or go to the Emergency Department if any problems should occur. I do not have any questions at this time, but understand that I may call the clinic during office hours   should I have any questions or need assistance in obtaining follow up care.    __________________________________________  _____________  __________ Signature of Patient or Authorized Representative            Date                   Time    __________________________________________ Nurse's Signature    

## 2012-08-10 ENCOUNTER — Ambulatory Visit: Payer: Medicaid Other | Admitting: Physical Therapy

## 2012-08-11 ENCOUNTER — Ambulatory Visit: Payer: Medicaid Other | Admitting: Physical Therapy

## 2012-08-14 ENCOUNTER — Ambulatory Visit: Payer: Medicaid Other | Admitting: Physical Therapy

## 2012-08-14 ENCOUNTER — Ambulatory Visit: Payer: Self-pay

## 2012-08-16 ENCOUNTER — Ambulatory Visit: Payer: Medicaid Other

## 2012-08-16 ENCOUNTER — Other Ambulatory Visit: Payer: Self-pay | Admitting: *Deleted

## 2012-08-16 MED ORDER — METHADONE HCL 5 MG PO TABS: 15.0000 mg | ORAL_TABLET | Freq: Three times a day (TID) | ORAL | Status: DC | PRN

## 2012-08-17 ENCOUNTER — Ambulatory Visit: Payer: Medicaid Other | Admitting: Physical Therapy

## 2012-08-18 ENCOUNTER — Ambulatory Visit: Payer: Medicaid Other | Admitting: Physical Therapy

## 2012-08-22 ENCOUNTER — Ambulatory Visit: Payer: Medicaid Other | Admitting: Physical Therapy

## 2012-08-23 ENCOUNTER — Telehealth: Payer: Self-pay | Admitting: *Deleted

## 2012-08-23 ENCOUNTER — Ambulatory Visit (HOSPITAL_BASED_OUTPATIENT_CLINIC_OR_DEPARTMENT_OTHER): Payer: Self-pay | Admitting: Oncology

## 2012-08-23 ENCOUNTER — Ambulatory Visit: Payer: Medicaid Other | Attending: Radiation Oncology

## 2012-08-23 VITALS — BP 127/79 | HR 76 | Temp 98.5°F | Resp 20 | Ht 65.0 in | Wt 225.5 lb

## 2012-08-23 DIAGNOSIS — IMO0001 Reserved for inherently not codable concepts without codable children: Secondary | ICD-10-CM | POA: Insufficient documentation

## 2012-08-23 DIAGNOSIS — C50919 Malignant neoplasm of unspecified site of unspecified female breast: Secondary | ICD-10-CM | POA: Insufficient documentation

## 2012-08-23 DIAGNOSIS — C50119 Malignant neoplasm of central portion of unspecified female breast: Secondary | ICD-10-CM

## 2012-08-23 DIAGNOSIS — I890 Lymphedema, not elsewhere classified: Secondary | ICD-10-CM

## 2012-08-23 DIAGNOSIS — M24519 Contracture, unspecified shoulder: Secondary | ICD-10-CM | POA: Insufficient documentation

## 2012-08-23 DIAGNOSIS — G893 Neoplasm related pain (acute) (chronic): Secondary | ICD-10-CM

## 2012-08-23 DIAGNOSIS — C773 Secondary and unspecified malignant neoplasm of axilla and upper limb lymph nodes: Secondary | ICD-10-CM

## 2012-08-23 DIAGNOSIS — M6281 Muscle weakness (generalized): Secondary | ICD-10-CM | POA: Insufficient documentation
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
## 2012-08-23 NOTE — Telephone Encounter (Signed)
Gave patient appointment for 09-18-2012  10-09-2012  10-30-2012   Sent michelle email to set up patient's treatment

## 2012-08-24 ENCOUNTER — Telehealth: Payer: Self-pay | Admitting: *Deleted

## 2012-08-24 NOTE — Telephone Encounter (Signed)
Per staff message and POF I have scheduled appts.  JMW  

## 2012-08-28 ENCOUNTER — Ambulatory Visit: Payer: Medicaid Other | Admitting: Physical Therapy

## 2012-08-28 ENCOUNTER — Ambulatory Visit: Payer: Self-pay

## 2012-08-28 ENCOUNTER — Other Ambulatory Visit: Payer: Self-pay | Admitting: Lab

## 2012-08-28 ENCOUNTER — Telehealth: Payer: Self-pay | Admitting: *Deleted

## 2012-08-28 NOTE — Telephone Encounter (Signed)
Per patient call, she is not feeling well. I have moved her appts from today to tomorrow.  JMW

## 2012-08-29 ENCOUNTER — Other Ambulatory Visit: Payer: Self-pay | Admitting: Lab

## 2012-08-29 ENCOUNTER — Telehealth: Payer: Self-pay | Admitting: *Deleted

## 2012-08-29 ENCOUNTER — Other Ambulatory Visit: Payer: Self-pay | Admitting: Physician Assistant

## 2012-08-29 ENCOUNTER — Ambulatory Visit: Payer: Self-pay

## 2012-08-29 NOTE — Telephone Encounter (Signed)
Patient called and canceled appts for today. Appts moved to Thrusday.  [name]

## 2012-08-30 ENCOUNTER — Ambulatory Visit: Payer: Medicaid Other | Admitting: Physical Therapy

## 2012-08-31 ENCOUNTER — Other Ambulatory Visit (HOSPITAL_BASED_OUTPATIENT_CLINIC_OR_DEPARTMENT_OTHER): Payer: Self-pay | Admitting: Lab

## 2012-08-31 ENCOUNTER — Ambulatory Visit (HOSPITAL_BASED_OUTPATIENT_CLINIC_OR_DEPARTMENT_OTHER): Payer: Self-pay

## 2012-08-31 VITALS — BP 142/82 | HR 87 | Temp 97.8°F | Resp 20

## 2012-08-31 DIAGNOSIS — C50919 Malignant neoplasm of unspecified site of unspecified female breast: Secondary | ICD-10-CM

## 2012-08-31 DIAGNOSIS — Z5112 Encounter for antineoplastic immunotherapy: Secondary | ICD-10-CM

## 2012-08-31 LAB — CBC WITH DIFFERENTIAL/PLATELET
BASO%: 0.2 % (ref 0.0–2.0)
Basophils Absolute: 0 10*3/uL (ref 0.0–0.1)
EOS%: 1.6 % (ref 0.0–7.0)
HGB: 13.6 g/dL (ref 11.6–15.9)
MCH: 29.8 pg (ref 25.1–34.0)
MONO#: 0.3 10*3/uL (ref 0.1–0.9)
RDW: 15.5 % — ABNORMAL HIGH (ref 11.2–14.5)
WBC: 4.4 10*3/uL (ref 3.9–10.3)
lymph#: 1 10*3/uL (ref 0.9–3.3)

## 2012-08-31 MED ORDER — TRASTUZUMAB CHEMO INJECTION 440 MG
6.0000 mg/kg | Freq: Once | INTRAVENOUS | Status: AC
  Administered 2012-08-31: 609 mg via INTRAVENOUS
  Filled 2012-08-31: qty 29

## 2012-08-31 MED ORDER — SODIUM CHLORIDE 0.9 % IV SOLN
Freq: Once | INTRAVENOUS | Status: AC
  Administered 2012-08-31: 12:00:00 via INTRAVENOUS

## 2012-08-31 MED ORDER — SODIUM CHLORIDE 0.9 % IJ SOLN
10.0000 mL | INTRAMUSCULAR | Status: DC | PRN
  Filled 2012-08-31: qty 10

## 2012-08-31 MED ORDER — DIPHENHYDRAMINE HCL 25 MG PO CAPS
25.0000 mg | ORAL_CAPSULE | Freq: Once | ORAL | Status: AC
  Administered 2012-08-31: 25 mg via ORAL

## 2012-08-31 MED ORDER — HEPARIN SOD (PORK) LOCK FLUSH 100 UNIT/ML IV SOLN
500.0000 [IU] | Freq: Once | INTRAVENOUS | Status: DC | PRN
  Filled 2012-08-31: qty 5

## 2012-08-31 MED ORDER — ACETAMINOPHEN 325 MG PO TABS
650.0000 mg | ORAL_TABLET | Freq: Once | ORAL | Status: AC
  Administered 2012-08-31: 650 mg via ORAL

## 2012-08-31 NOTE — Patient Instructions (Signed)
Patient aware of next appointment; discharged home with no complaints. 

## 2012-09-01 ENCOUNTER — Encounter: Payer: Self-pay | Admitting: Radiation Oncology

## 2012-09-04 ENCOUNTER — Ambulatory Visit: Payer: Medicaid Other | Admitting: Physical Therapy

## 2012-09-04 ENCOUNTER — Other Ambulatory Visit: Payer: Self-pay | Admitting: *Deleted

## 2012-09-04 ENCOUNTER — Ambulatory Visit: Payer: Self-pay

## 2012-09-04 NOTE — Telephone Encounter (Signed)
Pt left message stating she needs a refill on methadone.  Return call number left.  Per med list last refill was given on 8/28 for 270 tabs with dosage of 3 tabs q 8 hours. Script dispense should allow for 30 day supply. Request for refill is 10 days early.  This RN called pt and obtain identified VM- message left informing pt prescription request is early and would need to speak with pt per use and dosage.

## 2012-09-05 ENCOUNTER — Ambulatory Visit: Payer: Self-pay | Admitting: Radiation Oncology

## 2012-09-05 ENCOUNTER — Telehealth: Payer: Self-pay | Admitting: *Deleted

## 2012-09-08 ENCOUNTER — Encounter (INDEPENDENT_AMBULATORY_CARE_PROVIDER_SITE_OTHER): Payer: Self-pay | Admitting: General Surgery

## 2012-09-11 ENCOUNTER — Other Ambulatory Visit: Payer: Self-pay | Admitting: *Deleted

## 2012-09-11 MED ORDER — METHADONE HCL 5 MG PO TABS: 15.0000 mg | ORAL_TABLET | Freq: Three times a day (TID) | ORAL | Status: DC | PRN

## 2012-09-12 ENCOUNTER — Other Ambulatory Visit: Payer: Self-pay | Admitting: Physician Assistant

## 2012-09-12 DIAGNOSIS — I10 Essential (primary) hypertension: Secondary | ICD-10-CM

## 2012-09-12 DIAGNOSIS — C50919 Malignant neoplasm of unspecified site of unspecified female breast: Secondary | ICD-10-CM

## 2012-09-18 ENCOUNTER — Ambulatory Visit (HOSPITAL_BASED_OUTPATIENT_CLINIC_OR_DEPARTMENT_OTHER): Payer: Self-pay

## 2012-09-18 ENCOUNTER — Other Ambulatory Visit: Payer: Self-pay | Admitting: Oncology

## 2012-09-18 ENCOUNTER — Telehealth: Payer: Self-pay | Admitting: *Deleted

## 2012-09-18 ENCOUNTER — Other Ambulatory Visit (HOSPITAL_BASED_OUTPATIENT_CLINIC_OR_DEPARTMENT_OTHER): Payer: Self-pay | Admitting: Lab

## 2012-09-18 ENCOUNTER — Other Ambulatory Visit: Payer: Self-pay | Admitting: *Deleted

## 2012-09-18 ENCOUNTER — Other Ambulatory Visit: Payer: Self-pay | Admitting: Physician Assistant

## 2012-09-18 VITALS — BP 127/84 | HR 66 | Temp 97.0°F

## 2012-09-18 DIAGNOSIS — R309 Painful micturition, unspecified: Secondary | ICD-10-CM | POA: Insufficient documentation

## 2012-09-18 DIAGNOSIS — Z5112 Encounter for antineoplastic immunotherapy: Secondary | ICD-10-CM

## 2012-09-18 DIAGNOSIS — C50919 Malignant neoplasm of unspecified site of unspecified female breast: Secondary | ICD-10-CM

## 2012-09-18 DIAGNOSIS — N23 Unspecified renal colic: Secondary | ICD-10-CM

## 2012-09-18 DIAGNOSIS — I2510 Atherosclerotic heart disease of native coronary artery without angina pectoris: Secondary | ICD-10-CM

## 2012-09-18 LAB — URINALYSIS, MICROSCOPIC - CHCC
Nitrite: NEGATIVE
Protein: NEGATIVE mg/dL
RBC / HPF: NEGATIVE (ref 0–2)

## 2012-09-18 LAB — CBC WITH DIFFERENTIAL/PLATELET
Basophils Absolute: 0 10*3/uL (ref 0.0–0.1)
Eosinophils Absolute: 0.1 10*3/uL (ref 0.0–0.5)
HGB: 13 g/dL (ref 11.6–15.9)
MCV: 90.3 fL (ref 79.5–101.0)
NEUT#: 3.4 10*3/uL (ref 1.5–6.5)
RDW: 15.7 % — ABNORMAL HIGH (ref 11.2–14.5)
lymph#: 1.1 10*3/uL (ref 0.9–3.3)

## 2012-09-18 LAB — COMPREHENSIVE METABOLIC PANEL (CC13)
Albumin: 3.4 g/dL — ABNORMAL LOW (ref 3.5–5.0)
CO2: 21 mEq/L — ABNORMAL LOW (ref 22–29)
Calcium: 9 mg/dL (ref 8.4–10.4)
Glucose: 110 mg/dl — ABNORMAL HIGH (ref 70–99)
Potassium: 3.4 mEq/L — ABNORMAL LOW (ref 3.5–5.1)
Sodium: 141 mEq/L (ref 136–145)
Total Protein: 6.5 g/dL (ref 6.4–8.3)

## 2012-09-18 MED ORDER — SODIUM CHLORIDE 0.9 % IV SOLN
6.0000 mg/kg | Freq: Once | INTRAVENOUS | Status: AC
  Administered 2012-09-18: 609 mg via INTRAVENOUS
  Filled 2012-09-18: qty 29

## 2012-09-18 MED ORDER — DIPHENHYDRAMINE HCL 25 MG PO CAPS
25.0000 mg | ORAL_CAPSULE | Freq: Once | ORAL | Status: AC
  Administered 2012-09-18: 25 mg via ORAL

## 2012-09-18 MED ORDER — HEPARIN SOD (PORK) LOCK FLUSH 100 UNIT/ML IV SOLN
500.0000 [IU] | Freq: Once | INTRAVENOUS | Status: AC | PRN
  Administered 2012-09-18: 500 [IU]
  Filled 2012-09-18: qty 5

## 2012-09-18 MED ORDER — SODIUM CHLORIDE 0.9 % IV SOLN
Freq: Once | INTRAVENOUS | Status: AC
  Administered 2012-09-18: 14:00:00 via INTRAVENOUS

## 2012-09-18 MED ORDER — ACETAMINOPHEN 325 MG PO TABS
650.0000 mg | ORAL_TABLET | Freq: Once | ORAL | Status: AC
  Administered 2012-09-18: 650 mg via ORAL

## 2012-09-18 MED ORDER — SODIUM CHLORIDE 0.9 % IJ SOLN
10.0000 mL | INTRAMUSCULAR | Status: DC | PRN
  Administered 2012-09-18: 10 mL
  Filled 2012-09-18: qty 10

## 2012-09-18 NOTE — Patient Instructions (Addendum)
[facility] Discharge Instructions for Patients Receiving Chemotherapy  Today you received the following chemotherapy agents :  Herceptin.  To help prevent nausea and vomiting after your treatment, we encourage you to take your nausea medication as instructed by your physician, and take meds as needed for nausea.    If you develop nausea and vomiting that is not controlled by your nausea medication, call the clinic. If it is after clinic hours your family physician or the after hours number for the clinic or go to the Emergency Department.   BELOW ARE SYMPTOMS THAT SHOULD BE REPORTED IMMEDIATELY:  *FEVER GREATER THAN 100.5 F  *CHILLS WITH OR WITHOUT FEVER  NAUSEA AND VOMITING THAT IS NOT CONTROLLED WITH YOUR NAUSEA MEDICATION  *UNUSUAL SHORTNESS OF BREATH  *UNUSUAL BRUISING OR BLEEDING  TENDERNESS IN MOUTH AND THROAT WITH OR WITHOUT PRESENCE OF ULCERS  *URINARY PROBLEMS  *BOWEL PROBLEMS  UNUSUAL RASH Items with * indicate a potential emergency and should be followed up as soon as possible.  One of the nurses will contact you 24 hours after your treatment. Please let the nurse know about any problems that you may have experienced. Feel free to call the clinic you have any questions or concerns. The clinic phone number is [phone].   I have been informed and understand all the instructions given to me. I know to contact the clinic, my physician, or go to the Emergency Department if any problems should occur. I do not have any questions at this time, but understand that I may call the clinic during office hours   should I have any questions or need assistance in obtaining follow up care.    __________________________________________  _____________  __________ Signature of Patient or Authorized Representative            Date                   Time    __________________________________________ Nurse's Signature

## 2012-09-18 NOTE — Telephone Encounter (Signed)
Gave patient appointment for echo on 10-24-2012 at 10:00am in [place]

## 2012-09-19 ENCOUNTER — Ambulatory Visit
Admission: RE | Admit: 2012-09-19 | Discharge: 2012-09-19 | Disposition: A | Discharge status: Home / Self Care | Payer: Medicaid Other | Source: Ambulatory Visit | Attending: Radiation Oncology | Admitting: Radiation Oncology

## 2012-09-19 ENCOUNTER — Encounter: Payer: Self-pay | Admitting: Radiation Oncology

## 2012-09-19 VITALS — BP 134/86 | HR 81 | Temp 97.3°F | Resp 20 | Wt 227.5 lb

## 2012-09-19 DIAGNOSIS — C50919 Malignant neoplasm of unspecified site of unspecified female breast: Secondary | ICD-10-CM

## 2012-09-19 NOTE — Progress Notes (Signed)
Follow up left breast s/p rad txs:05/11/12-07/31/12, patient has her lymphedema sleve left arm, has had 2 weeks now, states"It does help with the swelling", alert,oriented x3, left chest wall still has hyperpigmentation(tanning)., skin intact, well healed, meds updated, no c/o pain in chest area, sometimes has numbness in right hand.,., gets herceptin Iv q 3 weeks, next due 10/09/12,  Echocardiogram scheduled for 10/24/12 Taking tamoxifen 20mg  daily  Started day after rad tx completed

## 2012-09-19 NOTE — Progress Notes (Signed)
Followup note:  [name] returns today approximately 6 weeks following completion of post mastectomy radiotherapy to her left chest wall and regional lymph nodes the management of her pathologic stage IIIA  (T3, N1, M0) invasive ductal carcinoma of the left breast. She also had multi-focal invasive lobular carcinoma (T1 N0) involving her right breast. Unfortunately, she has been dealing with lymphedema and has completed her physical therapy. She now wears a left arm sleeve and glove. She remains active. She continues with her Herceptin and tamoxifen. She does report some "tingling" along her right hand which is unexplained. She denies neck pain. She sees Dr. Darnelle Catalan again in November.  Physical examination: Alert and oriented. She is in good spirits. Wt Readings from Last 3 Encounters:  09/19/12 227 lb 8 oz (103.193 kg)  08/23/12 225 lb 8 oz (102.286 kg)  07/31/12 224 lb 8 oz (101.833 kg)   Temp Readings from Last 3 Encounters:  09/19/12 97.3 F (36.3 C) Oral  09/18/12 97 F (36.1 C) Oral  08/31/12 97.8 F (36.6 C) Oral   BP Readings from Last 3 Encounters:  09/19/12 134/86  09/18/12 127/84  08/31/12 142/82   Pulse Readings from Last 3 Encounters:  09/19/12 81  09/18/12 66  08/31/12 87   Head and neck examination: Grossly unremarkable. Nodes: Without palpable cervical, supraclavicular, or axillary lymphadenopathy. Chest: Bilateral mastectomies. There is marked hyperpigmentation of the skin along her left chest wall and axilla with no visible or palpable evidence for recurrent disease. Along her left mastectomy scar is an 8 mm area of hyperkeratosis which I believe is related to healing from her focal moist desquamation. I do not feel this represents recurrent disease. Right chest wall without masses or lesions. Lungs clear. Abdomen without hepatomegaly. Extremities 1-2+ left upper extremity lymphedema with elastic sleeve and glove. Neurologic examination: Grossly nonfocal.  Laboratory  data: Lab Results  Component Value Date   WBC 4.7 09/18/2012   HGB 13.0 09/18/2012   HCT 39.1 09/18/2012   MCV 90.3 09/18/2012   PLT 336 09/18/2012   Impression: Satisfactory progress with no evidence for recurrent disease. It should be recalled that her treatment was suboptimal because of numerous missed treatments secondary to poor patient compliance.  Plan: Followup through Dr. Darnelle Catalan.

## 2012-09-20 LAB — URINE CULTURE

## 2012-10-04 ENCOUNTER — Other Ambulatory Visit: Payer: Self-pay | Admitting: Emergency Medicine

## 2012-10-04 MED ORDER — METHADONE HCL 5 MG PO TABS: 15.0000 mg | ORAL_TABLET | Freq: Three times a day (TID) | ORAL | Status: DC | PRN

## 2012-10-04 NOTE — Telephone Encounter (Signed)
Pharmacist called about today's methadone prescription.  Verbal order received and read back from dr. Darnelle Catalan to fill today.  Pharmacist notified to proceed with this refill.

## 2012-10-09 ENCOUNTER — Ambulatory Visit: Payer: Self-pay

## 2012-10-09 ENCOUNTER — Other Ambulatory Visit: Payer: Self-pay | Admitting: Lab

## 2012-10-09 ENCOUNTER — Telehealth: Payer: Self-pay | Admitting: *Deleted

## 2012-10-09 NOTE — Telephone Encounter (Signed)
Per desk RN I have rescheduled appts for patient to next week. Patient notified. JMW

## 2012-10-09 NOTE — Telephone Encounter (Signed)
Per patient call she canceled her appts for tomorrow. Patient not feeling well. Message forwarded to the desk RN.  JMW

## 2012-10-13 ENCOUNTER — Encounter: Payer: Self-pay | Admitting: Pharmacist

## 2012-10-16 ENCOUNTER — Other Ambulatory Visit (HOSPITAL_BASED_OUTPATIENT_CLINIC_OR_DEPARTMENT_OTHER): Payer: Self-pay | Admitting: Lab

## 2012-10-16 ENCOUNTER — Ambulatory Visit (HOSPITAL_BASED_OUTPATIENT_CLINIC_OR_DEPARTMENT_OTHER): Payer: Self-pay

## 2012-10-16 VITALS — BP 111/67 | HR 69 | Temp 97.7°F

## 2012-10-16 DIAGNOSIS — Z5112 Encounter for antineoplastic immunotherapy: Secondary | ICD-10-CM

## 2012-10-16 DIAGNOSIS — C50919 Malignant neoplasm of unspecified site of unspecified female breast: Secondary | ICD-10-CM

## 2012-10-16 DIAGNOSIS — C773 Secondary and unspecified malignant neoplasm of axilla and upper limb lymph nodes: Secondary | ICD-10-CM

## 2012-10-16 LAB — CBC WITH DIFFERENTIAL/PLATELET
Basophils Absolute: 0 10*3/uL (ref 0.0–0.1)
EOS%: 1.5 % (ref 0.0–7.0)
Eosinophils Absolute: 0.1 10*3/uL (ref 0.0–0.5)
HCT: 39.3 % (ref 34.8–46.6)
HGB: 13.3 g/dL (ref 11.6–15.9)
MONO#: 0.5 10*3/uL (ref 0.1–0.9)
NEUT#: 4.6 10*3/uL (ref 1.5–6.5)
NEUT%: 70.7 % (ref 38.4–76.8)
RDW: 14.6 % — ABNORMAL HIGH (ref 11.2–14.5)
WBC: 6.6 10*3/uL (ref 3.9–10.3)
lymph#: 1.3 10*3/uL (ref 0.9–3.3)

## 2012-10-16 MED ORDER — SODIUM CHLORIDE 0.9 % IJ SOLN
10.0000 mL | INTRAMUSCULAR | Status: DC | PRN
  Administered 2012-10-16: 10 mL
  Filled 2012-10-16: qty 10

## 2012-10-16 MED ORDER — TRASTUZUMAB CHEMO INJECTION 440 MG
6.0000 mg/kg | Freq: Once | INTRAVENOUS | Status: AC
  Administered 2012-10-16: 609 mg via INTRAVENOUS
  Filled 2012-10-16: qty 29

## 2012-10-16 MED ORDER — HEPARIN SOD (PORK) LOCK FLUSH 100 UNIT/ML IV SOLN
500.0000 [IU] | Freq: Once | INTRAVENOUS | Status: AC | PRN
  Administered 2012-10-16: 500 [IU]
  Filled 2012-10-16: qty 5

## 2012-10-16 MED ORDER — ACETAMINOPHEN 325 MG PO TABS
650.0000 mg | ORAL_TABLET | Freq: Once | ORAL | Status: AC
  Administered 2012-10-16: 650 mg via ORAL

## 2012-10-16 MED ORDER — DIPHENHYDRAMINE HCL 25 MG PO CAPS
25.0000 mg | ORAL_CAPSULE | Freq: Once | ORAL | Status: AC
  Administered 2012-10-16: 25 mg via ORAL

## 2012-10-16 MED ORDER — SODIUM CHLORIDE 0.9 % IV SOLN
Freq: Once | INTRAVENOUS | Status: AC
  Administered 2012-10-16: 12:00:00 via INTRAVENOUS

## 2012-10-16 NOTE — Patient Instructions (Signed)
[facility] Discharge Instructions for Patients Receiving Chemotherapy  Today you received the following chemotherapy agent Herceptin  To help prevent nausea and vomiting after your treatment, we encourage you to take your nausea medication. Begin taking your nausea medication as often as prescribed for by Dr. Magrinat.    If you develop nausea and vomiting that is not controlled by your nausea medication, call the clinic. If it is after clinic hours your family physician or the after hours number for the clinic or go to the Emergency Department.   BELOW ARE SYMPTOMS THAT SHOULD BE REPORTED IMMEDIATELY:  *FEVER GREATER THAN 100.5 F  *CHILLS WITH OR WITHOUT FEVER  NAUSEA AND VOMITING THAT IS NOT CONTROLLED WITH YOUR NAUSEA MEDICATION  *UNUSUAL SHORTNESS OF BREATH  *UNUSUAL BRUISING OR BLEEDING  TENDERNESS IN MOUTH AND THROAT WITH OR WITHOUT PRESENCE OF ULCERS  *URINARY PROBLEMS  *BOWEL PROBLEMS  UNUSUAL RASH Items with * indicate a potential emergency and should be followed up as soon as possible.  One of the nurses will contact you 24 hours after your treatment. Please let the nurse know about any problems that you may have experienced. Feel free to call the clinic you have any questions or concerns. The clinic phone number is [phone].   I have been informed and understand all the instructions given to me. I know to contact the clinic, my physician, or go to the Emergency Department if any problems should occur. I do not have any questions at this time, but understand that I may call the clinic during office hours   should I have any questions or need assistance in obtaining follow up care.    __________________________________________  _____________  __________ Signature of Patient or Authorized Representative            Date                   Time    __________________________________________ Nurse's Signature    

## 2012-10-17 ENCOUNTER — Encounter: Payer: Self-pay | Admitting: Oncology

## 2012-10-17 NOTE — Progress Notes (Signed)
Patient used a letter of support for financial assistance, unable to get herceptin replaced without proof of income.  She applied for Medicaid.

## 2012-10-23 ENCOUNTER — Other Ambulatory Visit: Payer: Self-pay | Admitting: *Deleted

## 2012-10-24 ENCOUNTER — Ambulatory Visit (HOSPITAL_COMMUNITY): Payer: Self-pay

## 2012-10-25 ENCOUNTER — Ambulatory Visit (HOSPITAL_COMMUNITY): Payer: Self-pay

## 2012-10-27 ENCOUNTER — Encounter (INDEPENDENT_AMBULATORY_CARE_PROVIDER_SITE_OTHER): Payer: Self-pay | Admitting: General Surgery

## 2012-10-27 ENCOUNTER — Other Ambulatory Visit: Payer: Self-pay | Admitting: *Deleted

## 2012-10-27 ENCOUNTER — Ambulatory Visit (HOSPITAL_COMMUNITY)
Admission: RE | Admit: 2012-10-27 | Discharge: 2012-10-27 | Disposition: A | Discharge status: Home / Self Care | Payer: Medicaid Other | Source: Ambulatory Visit | Attending: Physician Assistant | Admitting: Physician Assistant

## 2012-10-27 ENCOUNTER — Telehealth: Payer: Self-pay | Admitting: Oncology

## 2012-10-27 DIAGNOSIS — R0609 Other forms of dyspnea: Secondary | ICD-10-CM | POA: Insufficient documentation

## 2012-10-27 DIAGNOSIS — R0989 Other specified symptoms and signs involving the circulatory and respiratory systems: Secondary | ICD-10-CM | POA: Insufficient documentation

## 2012-10-27 DIAGNOSIS — F17200 Nicotine dependence, unspecified, uncomplicated: Secondary | ICD-10-CM | POA: Insufficient documentation

## 2012-10-27 DIAGNOSIS — I10 Essential (primary) hypertension: Secondary | ICD-10-CM | POA: Insufficient documentation

## 2012-10-27 DIAGNOSIS — C50919 Malignant neoplasm of unspecified site of unspecified female breast: Secondary | ICD-10-CM | POA: Insufficient documentation

## 2012-10-27 DIAGNOSIS — Z8249 Family history of ischemic heart disease and other diseases of the circulatory system: Secondary | ICD-10-CM | POA: Insufficient documentation

## 2012-10-27 DIAGNOSIS — E785 Hyperlipidemia, unspecified: Secondary | ICD-10-CM | POA: Insufficient documentation

## 2012-10-27 NOTE — Progress Notes (Signed)
  Echocardiogram 2D Echocardiogram limited has been performed.  Cathie Beams 10/27/2012, 2:37 PM

## 2012-10-27 NOTE — Telephone Encounter (Signed)
S/w michelle brown regarding this pt's herceptin appt needs to be moved to the following Friday.

## 2012-10-30 ENCOUNTER — Telehealth: Payer: Self-pay | Admitting: Oncology

## 2012-10-30 ENCOUNTER — Ambulatory Visit (HOSPITAL_BASED_OUTPATIENT_CLINIC_OR_DEPARTMENT_OTHER): Payer: Self-pay | Admitting: Physician Assistant

## 2012-10-30 ENCOUNTER — Ambulatory Visit (HOSPITAL_BASED_OUTPATIENT_CLINIC_OR_DEPARTMENT_OTHER): Payer: Self-pay

## 2012-10-30 ENCOUNTER — Encounter: Payer: Self-pay | Admitting: Physician Assistant

## 2012-10-30 ENCOUNTER — Other Ambulatory Visit (HOSPITAL_BASED_OUTPATIENT_CLINIC_OR_DEPARTMENT_OTHER): Payer: Self-pay | Admitting: Lab

## 2012-10-30 VITALS — BP 121/74 | HR 66 | Temp 98.2°F | Resp 20 | Ht 65.0 in | Wt 230.4 lb

## 2012-10-30 DIAGNOSIS — Z5112 Encounter for antineoplastic immunotherapy: Secondary | ICD-10-CM

## 2012-10-30 DIAGNOSIS — C50919 Malignant neoplasm of unspecified site of unspecified female breast: Secondary | ICD-10-CM

## 2012-10-30 DIAGNOSIS — C773 Secondary and unspecified malignant neoplasm of axilla and upper limb lymph nodes: Secondary | ICD-10-CM

## 2012-10-30 DIAGNOSIS — E876 Hypokalemia: Secondary | ICD-10-CM

## 2012-10-30 DIAGNOSIS — D649 Anemia, unspecified: Secondary | ICD-10-CM

## 2012-10-30 DIAGNOSIS — I890 Lymphedema, not elsewhere classified: Secondary | ICD-10-CM

## 2012-10-30 DIAGNOSIS — G893 Neoplasm related pain (acute) (chronic): Secondary | ICD-10-CM

## 2012-10-30 LAB — CBC WITH DIFFERENTIAL/PLATELET
Basophils Absolute: 0 10*3/uL (ref 0.0–0.1)
Eosinophils Absolute: 0.1 10*3/uL (ref 0.0–0.5)
HCT: 40.1 % (ref 34.8–46.6)
HGB: 13.4 g/dL (ref 11.6–15.9)
LYMPH%: 23.2 % (ref 14.0–49.7)
MONO#: 0.6 10*3/uL (ref 0.1–0.9)
NEUT#: 3.8 10*3/uL (ref 1.5–6.5)
Platelets: 325 10*3/uL (ref 145–400)
RBC: 4.41 10*6/uL (ref 3.70–5.45)
WBC: 5.9 10*3/uL (ref 3.9–10.3)

## 2012-10-30 MED ORDER — SODIUM CHLORIDE 0.9 % IJ SOLN
10.0000 mL | INTRAMUSCULAR | Status: DC | PRN
  Administered 2012-10-30: 10 mL via INTRAVENOUS
  Filled 2012-10-30: qty 10

## 2012-10-30 MED ORDER — HEPARIN SOD (PORK) LOCK FLUSH 100 UNIT/ML IV SOLN
500.0000 [IU] | Freq: Once | INTRAVENOUS | Status: AC | PRN
  Administered 2012-10-30: 500 [IU]
  Filled 2012-10-30: qty 5

## 2012-10-30 MED ORDER — ACETAMINOPHEN 325 MG PO TABS
650.0000 mg | ORAL_TABLET | Freq: Once | ORAL | Status: AC
  Administered 2012-10-30: 650 mg via ORAL

## 2012-10-30 MED ORDER — TRASTUZUMAB CHEMO INJECTION 440 MG
6.0000 mg/kg | Freq: Once | INTRAVENOUS | Status: AC
  Administered 2012-10-30: 609 mg via INTRAVENOUS
  Filled 2012-10-30: qty 29

## 2012-10-30 MED ORDER — DIPHENHYDRAMINE HCL 25 MG PO CAPS
25.0000 mg | ORAL_CAPSULE | Freq: Once | ORAL | Status: AC
  Administered 2012-10-30: 25 mg via ORAL

## 2012-10-30 MED ORDER — SODIUM CHLORIDE 0.9 % IV SOLN
Freq: Once | INTRAVENOUS | Status: AC
  Administered 2012-10-30: 15:00:00 via INTRAVENOUS

## 2012-10-30 NOTE — Patient Instructions (Signed)
Continue on tamoxifen, 20 mg daily.  Can have port removed at your and Dr. Jamse Mead convenience --  See him on 11/30/12 as scheduled.  Return in 3 months (Feb 2014)  for labs and follow up.

## 2012-10-30 NOTE — Patient Instructions (Signed)
Patient aware of next appointment; discharged home with no complaints. 

## 2012-10-30 NOTE — Telephone Encounter (Signed)
gve the pt her feb 2014 appt calendar. Pt is aware that she wiill be called with the port removal appt from [facility]
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
## 2012-10-31 NOTE — Telephone Encounter (Signed)
Duplicate entry

## 2012-11-02 ENCOUNTER — Other Ambulatory Visit: Payer: Self-pay | Admitting: *Deleted

## 2012-11-02 MED ORDER — METHADONE HCL 5 MG PO TABS: 15.0000 mg | ORAL_TABLET | Freq: Three times a day (TID) | ORAL | Status: DC | PRN

## 2012-11-08 ENCOUNTER — Telehealth (INDEPENDENT_AMBULATORY_CARE_PROVIDER_SITE_OTHER): Payer: Self-pay | Admitting: General Surgery

## 2012-11-08 ENCOUNTER — Encounter: Payer: Self-pay | Admitting: Oncology

## 2012-11-08 NOTE — Progress Notes (Signed)
[name] called 11/07/12 advising her new mailing address [address] [address] [facility] [address].

## 2012-11-08 NOTE — Telephone Encounter (Signed)
LMOM to let patient know to hold her ASA 5 days prior to her PAC removal once schd.Marland Kitchen

## 2012-11-10 ENCOUNTER — Telehealth (INDEPENDENT_AMBULATORY_CARE_PROVIDER_SITE_OTHER): Payer: Self-pay

## 2012-11-10 ENCOUNTER — Telehealth (INDEPENDENT_AMBULATORY_CARE_PROVIDER_SITE_OTHER): Payer: Self-pay | Admitting: General Surgery

## 2012-11-10 NOTE — Telephone Encounter (Signed)
Spoke with pt from surgery sched dept transfer for [facility]. I reviewed hx with pt and she stated she takes aspirin 325mg  for heart stents. Pt states she is not sure she should stop aspirin.  I gave copy of PAC form to [facility] for Dr Johna Sheriff to review and advise pt.

## 2012-11-10 NOTE — Telephone Encounter (Signed)
Called and spoke with patient to let her know to hold her ASA 5 days prior to [facility] removal...patient is aware and stated that she wanted to talk to Dr.Hoxworth at her next appt in December about plastic surgery.Marland Kitchen

## 2012-11-11 ENCOUNTER — Emergency Department (HOSPITAL_COMMUNITY): Payer: Medicaid Other

## 2012-11-11 ENCOUNTER — Encounter (HOSPITAL_COMMUNITY): Payer: Self-pay | Admitting: *Deleted

## 2012-11-11 ENCOUNTER — Emergency Department (HOSPITAL_COMMUNITY)
Admission: EM | Admit: 2012-11-11 | Discharge: 2012-11-12 | Disposition: A | Discharge status: Home / Self Care | Payer: Medicaid Other | Attending: Emergency Medicine | Admitting: Emergency Medicine

## 2012-11-11 DIAGNOSIS — G8929 Other chronic pain: Secondary | ICD-10-CM | POA: Insufficient documentation

## 2012-11-11 DIAGNOSIS — Z8739 Personal history of other diseases of the musculoskeletal system and connective tissue: Secondary | ICD-10-CM | POA: Insufficient documentation

## 2012-11-11 DIAGNOSIS — Z853 Personal history of malignant neoplasm of breast: Secondary | ICD-10-CM | POA: Insufficient documentation

## 2012-11-11 DIAGNOSIS — R0602 Shortness of breath: Secondary | ICD-10-CM | POA: Insufficient documentation

## 2012-11-11 DIAGNOSIS — Z87828 Personal history of other (healed) physical injury and trauma: Secondary | ICD-10-CM | POA: Insufficient documentation

## 2012-11-11 DIAGNOSIS — R079 Chest pain, unspecified: Secondary | ICD-10-CM | POA: Insufficient documentation

## 2012-11-11 DIAGNOSIS — I10 Essential (primary) hypertension: Secondary | ICD-10-CM | POA: Insufficient documentation

## 2012-11-11 DIAGNOSIS — I2510 Atherosclerotic heart disease of native coronary artery without angina pectoris: Secondary | ICD-10-CM | POA: Insufficient documentation

## 2012-11-11 DIAGNOSIS — E785 Hyperlipidemia, unspecified: Secondary | ICD-10-CM | POA: Insufficient documentation

## 2012-11-11 DIAGNOSIS — F17200 Nicotine dependence, unspecified, uncomplicated: Secondary | ICD-10-CM | POA: Insufficient documentation

## 2012-11-11 DIAGNOSIS — Z79899 Other long term (current) drug therapy: Secondary | ICD-10-CM | POA: Insufficient documentation

## 2012-11-11 DIAGNOSIS — Z7982 Long term (current) use of aspirin: Secondary | ICD-10-CM | POA: Insufficient documentation

## 2012-11-11 LAB — COMPREHENSIVE METABOLIC PANEL
CO2: 25 mEq/L (ref 19–32)
Calcium: 9.2 mg/dL (ref 8.4–10.5)
Creatinine, Ser: 1.2 mg/dL — ABNORMAL HIGH (ref 0.50–1.10)
GFR calc Af Amer: 60 mL/min — ABNORMAL LOW (ref >90)
GFR calc non Af Amer: 52 mL/min — ABNORMAL LOW (ref >90)
Glucose, Bld: 107 mg/dL — ABNORMAL HIGH (ref 70–99)

## 2012-11-11 LAB — PRO B NATRIURETIC PEPTIDE: Pro B Natriuretic peptide (BNP): 48.4 pg/mL (ref 0–125)

## 2012-11-11 LAB — CBC WITH DIFFERENTIAL/PLATELET
Basophils Absolute: 0 10*3/uL (ref 0.0–0.1)
Eosinophils Relative: 2 % (ref 0–5)
HCT: 37.9 % (ref 36.0–46.0)
Lymphocytes Relative: 26 % (ref 12–46)
Lymphs Abs: 1.7 10*3/uL (ref 0.7–4.0)
MCH: 30.3 pg (ref 26.0–34.0)
MCV: 90.5 fL (ref 78.0–100.0)
Monocytes Absolute: 0.5 10*3/uL (ref 0.1–1.0)
RDW: 14.1 % (ref 11.5–15.5)
WBC: 6.5 10*3/uL (ref 4.0–10.5)

## 2012-11-11 LAB — MAGNESIUM: Magnesium: 2 mg/dL (ref 1.5–2.5)

## 2012-11-11 MED ORDER — HYDROMORPHONE HCL PF 1 MG/ML IJ SOLN
1.0000 mg | Freq: Once | INTRAMUSCULAR | Status: AC
  Administered 2012-11-11: 1 mg via INTRAVENOUS
  Filled 2012-11-11: qty 1

## 2012-11-11 MED ORDER — ONDANSETRON HCL 4 MG/2ML IJ SOLN
4.0000 mg | Freq: Once | INTRAMUSCULAR | Status: AC
  Administered 2012-11-11: 4 mg via INTRAVENOUS
  Filled 2012-11-11: qty 2

## 2012-11-11 NOTE — ED Notes (Signed)
Pt finished her last chemo treatment November 11 .  States short of breath,

## 2012-11-12 LAB — D-DIMER, QUANTITATIVE: D-Dimer, Quant: 0.43 ug/mL-FEU (ref 0.00–0.48)

## 2012-11-12 MED ORDER — HEPARIN SOD (PORK) LOCK FLUSH 100 UNIT/ML IV SOLN
INTRAVENOUS | Status: AC
  Filled 2012-11-12: qty 5

## 2012-11-12 MED ORDER — PROMETHAZINE HCL 25 MG PO TABS: 25.0000 mg | ORAL_TABLET | Freq: Four times a day (QID) | ORAL | Status: DC | PRN

## 2012-11-12 MED ORDER — OXYCODONE-ACETAMINOPHEN 5-325 MG PO TABS: 1.0000 | ORAL_TABLET | Freq: Four times a day (QID) | ORAL | Status: DC | PRN
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
## 2012-11-30 ENCOUNTER — Ambulatory Visit (INDEPENDENT_AMBULATORY_CARE_PROVIDER_SITE_OTHER): Payer: Self-pay | Admitting: General Surgery

## 2012-11-30 ENCOUNTER — Encounter (INDEPENDENT_AMBULATORY_CARE_PROVIDER_SITE_OTHER): Payer: Self-pay | Admitting: General Surgery

## 2012-11-30 VITALS — BP 126/70 | HR 77 | Temp 98.0°F | Resp 18 | Ht 65.0 in | Wt 228.0 lb

## 2012-11-30 DIAGNOSIS — C50919 Malignant neoplasm of unspecified site of unspecified female breast: Secondary | ICD-10-CM

## 2012-11-30 NOTE — Progress Notes (Signed)
Chief complaint: Followup breast cancer  History: Patient returns for long-term followup status post bilateral mastectomy with axillary lymph node dissection on the left and sentinel node biopsy on the right for stage IIB invasive ductal carcinoma. She had 2 subcutaneous centimeter invasive cancers on the right. She underwent postoperative chemotherapy and radiation on the left and now on adjuvant tamoxifen. She has moderate lymphedema which is followed in the lymphedema clinic. She states she has some occasional soreness around her mastectomy sites and discomfort in her arm but overall is doing pretty well. She is interested in reconstruction.  Exam: BP 126/70  Pulse 77  Temp 98 F (36.7 C)  Resp 18  Ht 5\' 5"  (1.651 m)  Wt 228 lb (103.42 kg)  BMI 37.94 kg/m2  LMP 07/16/2011 General: Moderately obese African American female no distress Lymph nodes: No cervical, supraclavicular or axillary nodes palpable Lungs: Clear equal breath sounds bilaterally Chest wall: Well-healed mastectomy sites bilaterally. Moderate post radiation changes on the left. No masses or skin changes. Extremities: Moderate lymphedema on the left the sleeve in place  Assessment and plan: Doing well with no clinical evidence of recurrent disease. We will for plastic surgery. She is given a prescription for mastectomy bras and prosthesis.

## 2012-12-01 ENCOUNTER — Other Ambulatory Visit: Payer: Self-pay | Admitting: *Deleted

## 2012-12-01 MED ORDER — METHADONE HCL 5 MG PO TABS: 15.0000 mg | ORAL_TABLET | Freq: Three times a day (TID) | ORAL | Status: DC

## 2012-12-11 ENCOUNTER — Telehealth: Payer: Self-pay | Admitting: Radiation Oncology

## 2012-12-11 NOTE — Telephone Encounter (Signed)
Pt called to advise her MCD is active, efc 01/20/2010 to current. And that she has been approved for SSI & SSD.  MID: [id number]

## 2012-12-21 ENCOUNTER — Other Ambulatory Visit (INDEPENDENT_AMBULATORY_CARE_PROVIDER_SITE_OTHER): Payer: Self-pay

## 2012-12-21 ENCOUNTER — Telehealth (INDEPENDENT_AMBULATORY_CARE_PROVIDER_SITE_OTHER): Payer: Self-pay

## 2012-12-21 NOTE — Telephone Encounter (Signed)
Medical records faxed to Dr. Odis Luster [phone] for upcoming appointment on Monday 12/25/12 @ 1:30pm.

## 2012-12-26 DIAGNOSIS — Z452 Encounter for adjustment and management of vascular access device: Secondary | ICD-10-CM

## 2012-12-29 ENCOUNTER — Other Ambulatory Visit: Payer: Self-pay | Admitting: *Deleted

## 2012-12-29 MED ORDER — METHADONE HCL 5 MG PO TABS: 15.0000 mg | ORAL_TABLET | Freq: Three times a day (TID) | ORAL | Status: DC

## 2013-01-12 ENCOUNTER — Telehealth: Payer: Self-pay | Admitting: *Deleted

## 2013-01-12 NOTE — Telephone Encounter (Signed)
Pt left message stating concern due to " weight gain " despite change in diet and exercise.  This RN returned call and obtained pt's identified VM - message left informing weight gain needs to be addressed by cardiac or primary MD due to her history of cardiac issues.

## 2013-01-29 ENCOUNTER — Other Ambulatory Visit: Payer: Self-pay | Admitting: *Deleted

## 2013-01-29 MED ORDER — METHADONE HCL 5 MG PO TABS: 15.0000 mg | ORAL_TABLET | Freq: Three times a day (TID) | ORAL | Status: DC

## 2013-02-06 ENCOUNTER — Other Ambulatory Visit: Payer: Self-pay | Admitting: Lab

## 2013-02-06 ENCOUNTER — Encounter: Payer: Self-pay | Admitting: Oncology

## 2013-02-06 ENCOUNTER — Telehealth: Payer: Self-pay | Admitting: Oncology

## 2013-02-06 NOTE — Telephone Encounter (Signed)
gv pt appt schedule for February. Pt aware she will be contacted w/echo appt. Echo to linda for preauth.

## 2013-02-09 ENCOUNTER — Telehealth: Payer: Self-pay | Admitting: Oncology

## 2013-02-09 NOTE — Telephone Encounter (Signed)
S/w pt re appt for echo 2/24 @ 10am @ [facility]. Pt has f/u appt.

## 2013-02-12 ENCOUNTER — Telehealth (INDEPENDENT_AMBULATORY_CARE_PROVIDER_SITE_OTHER): Payer: Self-pay | Admitting: General Surgery

## 2013-02-12 ENCOUNTER — Ambulatory Visit (HOSPITAL_COMMUNITY)
Admission: RE | Admit: 2013-02-12 | Discharge: 2013-02-12 | Disposition: A | Discharge status: Home / Self Care | Payer: Medicaid Other | Source: Ambulatory Visit | Attending: Oncology | Admitting: Oncology

## 2013-02-12 DIAGNOSIS — Z79899 Other long term (current) drug therapy: Secondary | ICD-10-CM | POA: Insufficient documentation

## 2013-02-12 DIAGNOSIS — M129 Arthropathy, unspecified: Secondary | ICD-10-CM | POA: Insufficient documentation

## 2013-02-12 DIAGNOSIS — I2510 Atherosclerotic heart disease of native coronary artery without angina pectoris: Secondary | ICD-10-CM | POA: Insufficient documentation

## 2013-02-12 DIAGNOSIS — C50919 Malignant neoplasm of unspecified site of unspecified female breast: Secondary | ICD-10-CM | POA: Insufficient documentation

## 2013-02-12 DIAGNOSIS — I10 Essential (primary) hypertension: Secondary | ICD-10-CM | POA: Insufficient documentation

## 2013-02-12 DIAGNOSIS — F17200 Nicotine dependence, unspecified, uncomplicated: Secondary | ICD-10-CM | POA: Insufficient documentation

## 2013-02-12 DIAGNOSIS — E785 Hyperlipidemia, unspecified: Secondary | ICD-10-CM | POA: Insufficient documentation

## 2013-02-12 NOTE — Telephone Encounter (Signed)
Pt called to state she is having pain at site of PAC removal.  Requesting more Norco.  Paged and updated Dr. Johna Sheriff for this, as PAC removal was done > 6 weeks ago.  Dr. Johna Sheriff returned page and denied to order any further narcotics at this point.  Called pt and related this; advised her to use heat to site and NSAIDs for pain. She understands and will try this.

## 2013-02-12 NOTE — Progress Notes (Signed)
  Echocardiogram 2D Echocardiogram has been performed.  [name] 02/12/2013, 11:03 AM

## 2013-02-13 ENCOUNTER — Encounter: Payer: Self-pay | Admitting: Physician Assistant

## 2013-02-13 ENCOUNTER — Other Ambulatory Visit (HOSPITAL_BASED_OUTPATIENT_CLINIC_OR_DEPARTMENT_OTHER): Payer: Medicaid Other | Admitting: Lab

## 2013-02-13 ENCOUNTER — Ambulatory Visit (HOSPITAL_BASED_OUTPATIENT_CLINIC_OR_DEPARTMENT_OTHER): Payer: Medicaid Other | Admitting: Physician Assistant

## 2013-02-13 VITALS — BP 96/65 | HR 77 | Temp 97.6°F | Resp 20 | Ht 65.0 in | Wt 236.0 lb

## 2013-02-13 DIAGNOSIS — C50919 Malignant neoplasm of unspecified site of unspecified female breast: Secondary | ICD-10-CM

## 2013-02-13 DIAGNOSIS — D649 Anemia, unspecified: Secondary | ICD-10-CM

## 2013-02-13 DIAGNOSIS — C50912 Malignant neoplasm of unspecified site of left female breast: Secondary | ICD-10-CM

## 2013-02-13 DIAGNOSIS — R51 Headache: Secondary | ICD-10-CM

## 2013-02-13 DIAGNOSIS — M25531 Pain in right wrist: Secondary | ICD-10-CM

## 2013-02-13 DIAGNOSIS — M25539 Pain in unspecified wrist: Secondary | ICD-10-CM

## 2013-02-13 DIAGNOSIS — E876 Hypokalemia: Secondary | ICD-10-CM

## 2013-02-13 LAB — COMPREHENSIVE METABOLIC PANEL (CC13)
ALT: 20 U/L (ref 0–55)
AST: 19 U/L (ref 5–34)
Albumin: 3.4 g/dL — ABNORMAL LOW (ref 3.5–5.0)
BUN: 20.7 mg/dL (ref 7.0–26.0)
CO2: 22 mEq/L (ref 22–29)
Calcium: 9.4 mg/dL (ref 8.4–10.4)
Chloride: 106 mEq/L (ref 98–107)
Creatinine: 1.6 mg/dL — ABNORMAL HIGH (ref 0.6–1.1)
Potassium: 3.4 mEq/L — ABNORMAL LOW (ref 3.5–5.1)

## 2013-02-13 LAB — CBC WITH DIFFERENTIAL/PLATELET
BASO%: 0.5 % (ref 0.0–2.0)
Eosinophils Absolute: 0.1 10*3/uL (ref 0.0–0.5)
HCT: 42.1 % (ref 34.8–46.6)
HGB: 14.2 g/dL (ref 11.6–15.9)
LYMPH%: 20.4 % (ref 14.0–49.7)
MCHC: 33.8 g/dL (ref 31.5–36.0)
MONO#: 0.4 10*3/uL (ref 0.1–0.9)
NEUT#: 5.2 10*3/uL (ref 1.5–6.5)
NEUT%: 72 % (ref 38.4–76.8)
Platelets: 369 10*3/uL (ref 145–400)
WBC: 7.3 10*3/uL (ref 3.9–10.3)
lymph#: 1.5 10*3/uL (ref 0.9–3.3)
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
## 2013-02-16 ENCOUNTER — Telehealth: Payer: Self-pay | Admitting: Oncology

## 2013-02-16 ENCOUNTER — Ambulatory Visit (HOSPITAL_COMMUNITY)
Admission: RE | Admit: 2013-02-16 | Discharge: 2013-02-16 | Disposition: A | Discharge status: Home / Self Care | Payer: Medicaid Other | Source: Ambulatory Visit | Attending: Physician Assistant | Admitting: Physician Assistant

## 2013-02-16 DIAGNOSIS — G319 Degenerative disease of nervous system, unspecified: Secondary | ICD-10-CM | POA: Insufficient documentation

## 2013-02-16 DIAGNOSIS — H538 Other visual disturbances: Secondary | ICD-10-CM | POA: Insufficient documentation

## 2013-02-16 DIAGNOSIS — R51 Headache: Secondary | ICD-10-CM | POA: Insufficient documentation

## 2013-02-16 DIAGNOSIS — R42 Dizziness and giddiness: Secondary | ICD-10-CM | POA: Insufficient documentation

## 2013-02-16 DIAGNOSIS — C50912 Malignant neoplasm of unspecified site of left female breast: Secondary | ICD-10-CM

## 2013-02-16 DIAGNOSIS — C50919 Malignant neoplasm of unspecified site of unspecified female breast: Secondary | ICD-10-CM | POA: Insufficient documentation

## 2013-02-16 MED ORDER — GADOBENATE DIMEGLUMINE 529 MG/ML IV SOLN
10.0000 mL | Freq: Once | INTRAVENOUS | Status: AC | PRN
  Administered 2013-02-16: 10 mL via INTRAVENOUS

## 2013-02-16 NOTE — Telephone Encounter (Signed)
Late documentation. Prior to leaving 2/25 pt was given appt schedule for May and appt w/Dr. Odis Luster for 3/5 @ 1:30pm (s/w Melissa). S/w Alvino Chapel @ [facility] re appt w/Dr. Teressa Senter. Dr. Teressa Senter is out on sabbatical and pt can see Dr. Mina Marble but due to pt's ins notes will need to be reviewed before office will schedule appt. Pt made aware and understands office will contact re appt if provider can see her. Pt also has info for office and will follow up on appt. Copy of referral sent to HIM w/fax coversheet to send notes to Dr. Mina Marble for review today.

## 2013-02-19 ENCOUNTER — Other Ambulatory Visit: Payer: Self-pay | Admitting: *Deleted

## 2013-02-19 MED ORDER — LORATADINE-PSEUDOEPHEDRINE ER 10-240 MG PO TB24: 1.0000 | ORAL_TABLET | Freq: Every day | ORAL | Status: DC

## 2013-02-21 ENCOUNTER — Other Ambulatory Visit: Payer: Self-pay | Admitting: *Deleted

## 2013-02-21 MED ORDER — NITROGLYCERIN 0.4 MG SL SUBL: 0.4000 mg | SUBLINGUAL_TABLET | SUBLINGUAL | Status: DC | PRN

## 2013-02-22 ENCOUNTER — Emergency Department (HOSPITAL_COMMUNITY): Payer: Medicaid Other

## 2013-02-22 ENCOUNTER — Emergency Department (HOSPITAL_COMMUNITY)
Admission: EM | Admit: 2013-02-22 | Discharge: 2013-02-22 | Disposition: A | Discharge status: Home / Self Care | Payer: Medicaid Other | Attending: Emergency Medicine | Admitting: Emergency Medicine

## 2013-02-22 ENCOUNTER — Encounter (HOSPITAL_COMMUNITY): Payer: Self-pay | Admitting: *Deleted

## 2013-02-22 DIAGNOSIS — Z9861 Coronary angioplasty status: Secondary | ICD-10-CM | POA: Insufficient documentation

## 2013-02-22 DIAGNOSIS — Z7982 Long term (current) use of aspirin: Secondary | ICD-10-CM | POA: Insufficient documentation

## 2013-02-22 DIAGNOSIS — Z87828 Personal history of other (healed) physical injury and trauma: Secondary | ICD-10-CM | POA: Insufficient documentation

## 2013-02-22 DIAGNOSIS — Z79899 Other long term (current) drug therapy: Secondary | ICD-10-CM | POA: Insufficient documentation

## 2013-02-22 DIAGNOSIS — I10 Essential (primary) hypertension: Secondary | ICD-10-CM | POA: Insufficient documentation

## 2013-02-22 DIAGNOSIS — Z923 Personal history of irradiation: Secondary | ICD-10-CM | POA: Insufficient documentation

## 2013-02-22 DIAGNOSIS — Z853 Personal history of malignant neoplasm of breast: Secondary | ICD-10-CM | POA: Insufficient documentation

## 2013-02-22 DIAGNOSIS — I252 Old myocardial infarction: Secondary | ICD-10-CM | POA: Insufficient documentation

## 2013-02-22 DIAGNOSIS — F17200 Nicotine dependence, unspecified, uncomplicated: Secondary | ICD-10-CM | POA: Insufficient documentation

## 2013-02-22 DIAGNOSIS — R079 Chest pain, unspecified: Secondary | ICD-10-CM | POA: Insufficient documentation

## 2013-02-22 DIAGNOSIS — E785 Hyperlipidemia, unspecified: Secondary | ICD-10-CM | POA: Insufficient documentation

## 2013-02-22 DIAGNOSIS — G8929 Other chronic pain: Secondary | ICD-10-CM | POA: Insufficient documentation

## 2013-02-22 DIAGNOSIS — Z8739 Personal history of other diseases of the musculoskeletal system and connective tissue: Secondary | ICD-10-CM | POA: Insufficient documentation

## 2013-02-22 DIAGNOSIS — R0602 Shortness of breath: Secondary | ICD-10-CM | POA: Insufficient documentation

## 2013-02-22 DIAGNOSIS — I2510 Atherosclerotic heart disease of native coronary artery without angina pectoris: Secondary | ICD-10-CM | POA: Insufficient documentation

## 2013-02-22 DIAGNOSIS — R110 Nausea: Secondary | ICD-10-CM | POA: Insufficient documentation

## 2013-02-22 LAB — COMPREHENSIVE METABOLIC PANEL
ALT: 21 U/L (ref 0–35)
Albumin: 3.4 g/dL — ABNORMAL LOW (ref 3.5–5.2)
Alkaline Phosphatase: 84 U/L (ref 39–117)
Chloride: 102 mEq/L (ref 96–112)
Potassium: 3 mEq/L — ABNORMAL LOW (ref 3.5–5.1)
Sodium: 141 mEq/L (ref 135–145)
Total Bilirubin: 0.2 mg/dL — ABNORMAL LOW (ref 0.3–1.2)
Total Protein: 7.1 g/dL (ref 6.0–8.3)

## 2013-02-22 LAB — POCT I-STAT TROPONIN I

## 2013-02-22 LAB — CBC
Hemoglobin: 14 g/dL (ref 12.0–15.0)
MCHC: 34.1 g/dL (ref 30.0–36.0)
RDW: 14.3 % (ref 11.5–15.5)
WBC: 6.9 10*3/uL (ref 4.0–10.5)

## 2013-02-22 MED ORDER — ASPIRIN 81 MG PO CHEW: 324.0000 mg | CHEWABLE_TABLET | Freq: Once | ORAL | Status: DC

## 2013-02-22 MED ORDER — POTASSIUM CHLORIDE CRYS ER 20 MEQ PO TBCR
40.0000 meq | EXTENDED_RELEASE_TABLET | Freq: Once | ORAL | Status: AC
  Administered 2013-02-22: 40 meq via ORAL
  Filled 2013-02-22: qty 2

## 2013-02-22 MED ORDER — SODIUM CHLORIDE 0.9 % IV SOLN: 1000.0000 mL | INTRAVENOUS | Status: DC

## 2013-02-22 MED ORDER — SALINE SPRAY 0.65 % NA SOLN
1.0000 | Freq: Once | NASAL | Status: AC
  Administered 2013-02-22: 1 via NASAL
  Filled 2013-02-22: qty 44

## 2013-02-22 MED ORDER — NITROGLYCERIN 2 % TD OINT
1.0000 [in_us] | TOPICAL_OINTMENT | Freq: Once | TRANSDERMAL | Status: AC
  Administered 2013-02-22: 1 [in_us] via TOPICAL
  Filled 2013-02-22: qty 30

## 2013-02-22 NOTE — ED Notes (Signed)
Pt alert and oriented in NAD,  Pt request ginger ale and this was given,  Pt also request nasal saline spray and I advised her I would ask the doctor for an order.

## 2013-02-22 NOTE — ED Notes (Signed)
Pt reports lymphedema in L arm, s/p double mastectomy and lymph node removal. Did not wear sleeve today due to swelling. Began having L sided chest pain this am 0600. Slight sob. Denies n/v.
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
## 2013-02-22 NOTE — Progress Notes (Signed)
Pt confirmed pcp as [facility] at [facility]

## 2013-02-24 ENCOUNTER — Emergency Department (HOSPITAL_COMMUNITY): Payer: Medicaid Other

## 2013-02-24 ENCOUNTER — Emergency Department (HOSPITAL_COMMUNITY)
Admission: EM | Admit: 2013-02-24 | Discharge: 2013-02-24 | Disposition: A | Discharge status: Home / Self Care | Payer: Medicaid Other | Attending: Emergency Medicine | Admitting: Emergency Medicine

## 2013-02-24 ENCOUNTER — Encounter (HOSPITAL_COMMUNITY): Payer: Self-pay | Admitting: Emergency Medicine

## 2013-02-24 DIAGNOSIS — Z853 Personal history of malignant neoplasm of breast: Secondary | ICD-10-CM | POA: Insufficient documentation

## 2013-02-24 DIAGNOSIS — E785 Hyperlipidemia, unspecified: Secondary | ICD-10-CM | POA: Insufficient documentation

## 2013-02-24 DIAGNOSIS — R51 Headache: Secondary | ICD-10-CM | POA: Insufficient documentation

## 2013-02-24 DIAGNOSIS — Z8739 Personal history of other diseases of the musculoskeletal system and connective tissue: Secondary | ICD-10-CM | POA: Insufficient documentation

## 2013-02-24 DIAGNOSIS — I10 Essential (primary) hypertension: Secondary | ICD-10-CM | POA: Insufficient documentation

## 2013-02-24 DIAGNOSIS — R0602 Shortness of breath: Secondary | ICD-10-CM | POA: Insufficient documentation

## 2013-02-24 DIAGNOSIS — F17200 Nicotine dependence, unspecified, uncomplicated: Secondary | ICD-10-CM | POA: Insufficient documentation

## 2013-02-24 DIAGNOSIS — Z79899 Other long term (current) drug therapy: Secondary | ICD-10-CM | POA: Insufficient documentation

## 2013-02-24 DIAGNOSIS — Z7982 Long term (current) use of aspirin: Secondary | ICD-10-CM | POA: Insufficient documentation

## 2013-02-24 DIAGNOSIS — G8929 Other chronic pain: Secondary | ICD-10-CM | POA: Insufficient documentation

## 2013-02-24 DIAGNOSIS — Z923 Personal history of irradiation: Secondary | ICD-10-CM | POA: Insufficient documentation

## 2013-02-24 DIAGNOSIS — I2510 Atherosclerotic heart disease of native coronary artery without angina pectoris: Secondary | ICD-10-CM | POA: Insufficient documentation

## 2013-02-24 DIAGNOSIS — Z87828 Personal history of other (healed) physical injury and trauma: Secondary | ICD-10-CM | POA: Insufficient documentation

## 2013-02-24 LAB — BASIC METABOLIC PANEL
CO2: 23 mEq/L (ref 19–32)
Calcium: 8.6 mg/dL (ref 8.4–10.5)
Chloride: 105 mEq/L (ref 96–112)
Glucose, Bld: 85 mg/dL (ref 70–99)
Sodium: 139 mEq/L (ref 135–145)

## 2013-02-24 LAB — CBC WITH DIFFERENTIAL/PLATELET
Basophils Absolute: 0 10*3/uL (ref 0.0–0.1)
Eosinophils Relative: 1 % (ref 0–5)
HCT: 38 % (ref 36.0–46.0)
Lymphocytes Relative: 26 % (ref 12–46)
Lymphs Abs: 1.9 10*3/uL (ref 0.7–4.0)
MCV: 88 fL (ref 78.0–100.0)
Monocytes Absolute: 0.4 10*3/uL (ref 0.1–1.0)
Neutro Abs: 5.1 10*3/uL (ref 1.7–7.7)
Platelets: 394 10*3/uL (ref 150–400)
RBC: 4.32 MIL/uL (ref 3.87–5.11)
WBC: 7.5 10*3/uL (ref 4.0–10.5)

## 2013-02-24 LAB — TROPONIN I: Troponin I: <0.3 ng/mL (ref <0.30)

## 2013-02-24 MED ORDER — IRBESARTAN 300 MG PO TABS
300.0000 mg | ORAL_TABLET | Freq: Every day | ORAL | Status: DC
  Administered 2013-02-24: 300 mg via ORAL
  Filled 2013-02-24: qty 1

## 2013-02-24 MED ORDER — METOPROLOL SUCCINATE ER 100 MG PO TB24
100.0000 mg | ORAL_TABLET | Freq: Every day | ORAL | Status: DC
  Administered 2013-02-24: 100 mg via ORAL
  Filled 2013-02-24: qty 1

## 2013-02-24 MED ORDER — TRIAMTERENE-HCTZ 75-50 MG PO TABS
1.0000 | ORAL_TABLET | Freq: Every day | ORAL | Status: DC
  Administered 2013-02-24: 1 via ORAL
  Filled 2013-02-24: qty 1

## 2013-02-24 MED ORDER — ACETAMINOPHEN 325 MG PO TABS
650.0000 mg | ORAL_TABLET | Freq: Once | ORAL | Status: AC
  Administered 2013-02-24: 650 mg via ORAL
  Filled 2013-02-24: qty 2
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
## 2013-02-24 NOTE — ED Notes (Signed)
Pt alert, arrives from home, c/o head ache, sod, htn, onset several days ago, pt states medications have run out, resp even unlabored, skin pwd, speech clear, ambulates to triage

## 2013-02-24 NOTE — ED Notes (Signed)
Patient transported to X-ray 

## 2013-02-26 ENCOUNTER — Other Ambulatory Visit: Payer: Self-pay | Admitting: *Deleted

## 2013-02-26 MED ORDER — METHADONE HCL 5 MG PO TABS: 15.0000 mg | ORAL_TABLET | Freq: Three times a day (TID) | ORAL | Status: DC | PRN

## 2013-02-27 NOTE — ED Provider Notes (Signed)
Medical screening examination/treatment/procedure(s) were performed by non-physician practitioner and as supervising physician I was immediately available for consultation/collaboration.   Laray Anger, DO 02/27/13 1352

## 2013-03-26 ENCOUNTER — Ambulatory Visit: Payer: Medicaid Other | Admitting: Physical Therapy

## 2013-03-27 ENCOUNTER — Other Ambulatory Visit: Payer: Self-pay | Admitting: *Deleted

## 2013-03-27 DIAGNOSIS — I2510 Atherosclerotic heart disease of native coronary artery without angina pectoris: Secondary | ICD-10-CM

## 2013-03-27 DIAGNOSIS — C50919 Malignant neoplasm of unspecified site of unspecified female breast: Secondary | ICD-10-CM

## 2013-03-27 DIAGNOSIS — I10 Essential (primary) hypertension: Secondary | ICD-10-CM

## 2013-03-27 MED ORDER — METHADONE HCL 5 MG PO TABS: 15.0000 mg | ORAL_TABLET | Freq: Three times a day (TID) | ORAL | Status: DC | PRN

## 2013-03-27 NOTE — Telephone Encounter (Signed)
Pt called to this RN to inquire about obtaining a consult with dietician for weight loss secondary to upcoming surgery for reconstruction.  Per discussion weight concern is also related to pt's known CAD and hypertension.  This RN placed referral for ambulatory consult at [facility] per comorbid diagnosis.

## 2013-03-29 ENCOUNTER — Ambulatory Visit: Payer: Medicaid Other | Attending: Oncology | Admitting: Physical Therapy

## 2013-03-29 ENCOUNTER — Telehealth: Payer: Self-pay | Admitting: *Deleted

## 2013-03-29 DIAGNOSIS — IMO0001 Reserved for inherently not codable concepts without codable children: Secondary | ICD-10-CM | POA: Insufficient documentation

## 2013-03-29 DIAGNOSIS — I890 Lymphedema, not elsewhere classified: Secondary | ICD-10-CM | POA: Insufficient documentation

## 2013-03-29 NOTE — Telephone Encounter (Signed)
sw pt gv aptt d/t for the dietician w/ Pincus Large on 04/17/13@ 9:15am. Pt is aware.

## 2013-04-02 ENCOUNTER — Ambulatory Visit: Payer: Medicaid Other | Admitting: Physical Therapy

## 2013-04-05 ENCOUNTER — Ambulatory Visit: Payer: Medicaid Other

## 2013-04-09 ENCOUNTER — Ambulatory Visit: Payer: Medicaid Other

## 2013-04-11 ENCOUNTER — Ambulatory Visit: Payer: Medicaid Other

## 2013-04-13 ENCOUNTER — Ambulatory Visit: Payer: Medicaid Other

## 2013-04-16 ENCOUNTER — Ambulatory Visit: Payer: Medicaid Other | Admitting: Physical Therapy

## 2013-04-17 ENCOUNTER — Ambulatory Visit: Payer: Medicaid Other | Admitting: *Deleted

## 2013-04-18 ENCOUNTER — Ambulatory Visit: Payer: Medicaid Other

## 2013-04-20 ENCOUNTER — Ambulatory Visit: Payer: Medicaid Other | Attending: Oncology

## 2013-04-20 DIAGNOSIS — IMO0001 Reserved for inherently not codable concepts without codable children: Secondary | ICD-10-CM | POA: Insufficient documentation

## 2013-04-20 DIAGNOSIS — I890 Lymphedema, not elsewhere classified: Secondary | ICD-10-CM | POA: Insufficient documentation

## 2013-04-23 ENCOUNTER — Ambulatory Visit: Payer: Medicaid Other

## 2013-04-25 ENCOUNTER — Ambulatory Visit: Payer: Medicaid Other

## 2013-04-26 ENCOUNTER — Other Ambulatory Visit: Payer: Self-pay | Admitting: *Deleted

## 2013-04-26 DIAGNOSIS — I10 Essential (primary) hypertension: Secondary | ICD-10-CM

## 2013-04-26 DIAGNOSIS — C50919 Malignant neoplasm of unspecified site of unspecified female breast: Secondary | ICD-10-CM

## 2013-04-26 DIAGNOSIS — I2510 Atherosclerotic heart disease of native coronary artery without angina pectoris: Secondary | ICD-10-CM

## 2013-04-26 MED ORDER — METHADONE HCL 5 MG PO TABS: 15.0000 mg | ORAL_TABLET | Freq: Three times a day (TID) | ORAL | Status: DC | PRN

## 2013-04-27 ENCOUNTER — Ambulatory Visit: Payer: Medicaid Other

## 2013-04-30 ENCOUNTER — Ambulatory Visit: Payer: Medicaid Other

## 2013-05-01 ENCOUNTER — Ambulatory Visit: Payer: Medicaid Other

## 2013-05-02 ENCOUNTER — Ambulatory Visit: Payer: Medicaid Other

## 2013-05-04 ENCOUNTER — Ambulatory Visit: Payer: Medicaid Other

## 2013-05-07 ENCOUNTER — Ambulatory Visit: Payer: Medicaid Other | Admitting: Physical Therapy

## 2013-05-09 ENCOUNTER — Ambulatory Visit: Payer: Medicaid Other

## 2013-05-10 ENCOUNTER — Other Ambulatory Visit: Payer: Medicaid Other | Admitting: Lab

## 2013-05-10 ENCOUNTER — Encounter: Payer: Medicaid Other | Attending: Oncology | Admitting: *Deleted

## 2013-05-10 ENCOUNTER — Encounter: Payer: Self-pay | Admitting: *Deleted

## 2013-05-10 VITALS — Ht 65.0 in | Wt 247.3 lb

## 2013-05-10 DIAGNOSIS — I2510 Atherosclerotic heart disease of native coronary artery without angina pectoris: Secondary | ICD-10-CM | POA: Insufficient documentation

## 2013-05-10 DIAGNOSIS — Z713 Dietary counseling and surveillance: Secondary | ICD-10-CM | POA: Insufficient documentation

## 2013-05-10 DIAGNOSIS — Z853 Personal history of malignant neoplasm of breast: Secondary | ICD-10-CM | POA: Insufficient documentation

## 2013-05-10 DIAGNOSIS — I10 Essential (primary) hypertension: Secondary | ICD-10-CM | POA: Insufficient documentation

## 2013-05-10 NOTE — Patient Instructions (Addendum)
Plan:  Aim for 4 Carb Choices per meal (60 grams) +/- 1 either way  Aim for 0-2 Carbs per snack if hungry  Every 15 grams is 1 Carb Choice Consider reading food labels for Serving Size and  Total Carbohydrate of foods Consider ways of increasing your activity daily as tolerated

## 2013-05-10 NOTE — Progress Notes (Signed)
  Medical Nutrition Therapy:  Appt start time: 1500 end time:  1600.  Assessment:  Primary concerns today: patient here for CAD, HTN and s/p breast cancer. Lives with fiancee she shops and prepares the meals. No set schedule, has many MD appointments. Has gotten a 14 day pass to gym near her home so plans to increase her activity level by going there. Wants to lose weight.   MEDICATIONS: see list   DIETARY INTAKE: Usual eating pattern includes 3 meals and 3 snacks per day.  Everyday foods include fair variety of all food groups.  Avoided foods include: none stated.    24-hr recall:  B ( AM): sweetened cereal with 2%milk OR oatmeal with 1-3 tsp honey, fruit juice or water to drink Snk ( AM): 5-6 cookies, (chocolate or lemon) OR [employer] bar L ( PM): left overs and then had 2 [employer], fruit punch Snk ( PM): occasional, same as AM D ( PM): lean meat, starch and always vegetables, uses no salt in cooking Snk ( PM): same as AM Beverages: fruit punch, water, sweet tea occasionally  Usual physical activity: busy with MD appointments lately, does own cleaning at home including sweeping floors, hopes to try the gym  Estimated energy needs: 2000 calories 225 g carbohydrates 150 g protein 56 g fat  Progress Towards Goal(s):  In progress.   Nutritional Diagnosis:  NI-1.5 Excessive energy intake As related to activity level.  As evidenced by BMI of 41.2 .    Intervention:  Nutrition counseling for weight loss  initiated. Discussed Carb Counting and reading food labels as method of portion control and benefits of increased activity. She expressed good understanding of 4 Carb Meal Plan and appears motivated to get started. Asked her to consider ways to increase her activity level to help with weight loss for next visitt. . Plan:  Aim for 4 Carb Choices per meal (60 grams) +/- 1 either way  Aim for 0-2 Carbs per snack if hungry  Every 15 grams is 1 Carb Choice Consider reading food labels  for Serving Size and  Total Carbohydrate of foods Consider ways of increasing your activity daily as tolerated   Handouts given during visit include: Carb Counting and Food Label handouts Meal Plan Card  Monitoring/Evaluation:  Dietary intake, exercise, reading food labels, and body weight in 4 week(s).

## 2013-05-11 ENCOUNTER — Ambulatory Visit: Payer: Medicaid Other

## 2013-05-16 ENCOUNTER — Ambulatory Visit: Payer: Medicaid Other | Admitting: Physical Therapy

## 2013-05-17 ENCOUNTER — Ambulatory Visit (HOSPITAL_BASED_OUTPATIENT_CLINIC_OR_DEPARTMENT_OTHER): Payer: Medicaid Other | Admitting: Oncology

## 2013-05-17 ENCOUNTER — Telehealth: Payer: Self-pay | Admitting: Oncology

## 2013-05-17 VITALS — BP 110/75 | HR 88 | Temp 98.2°F | Resp 20 | Ht 65.0 in | Wt 245.4 lb

## 2013-05-17 DIAGNOSIS — I2510 Atherosclerotic heart disease of native coronary artery without angina pectoris: Secondary | ICD-10-CM

## 2013-05-17 DIAGNOSIS — G893 Neoplasm related pain (acute) (chronic): Secondary | ICD-10-CM

## 2013-05-17 DIAGNOSIS — C50919 Malignant neoplasm of unspecified site of unspecified female breast: Secondary | ICD-10-CM

## 2013-05-17 DIAGNOSIS — R05 Cough: Secondary | ICD-10-CM

## 2013-05-17 DIAGNOSIS — C50912 Malignant neoplasm of unspecified site of left female breast: Secondary | ICD-10-CM

## 2013-05-17 DIAGNOSIS — I10 Essential (primary) hypertension: Secondary | ICD-10-CM

## 2013-05-17 DIAGNOSIS — R51 Headache: Secondary | ICD-10-CM

## 2013-05-17 DIAGNOSIS — F17200 Nicotine dependence, unspecified, uncomplicated: Secondary | ICD-10-CM

## 2013-05-17 MED ORDER — METHADONE HCL 5 MG PO TABS: 15.0000 mg | ORAL_TABLET | Freq: Three times a day (TID) | ORAL | Status: DC

## 2013-05-17 NOTE — Addendum Note (Signed)
Addended by: Billey Co on: 05/17/2013 05:04 PM   Modules accepted: Orders
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
## 2013-05-21 ENCOUNTER — Ambulatory Visit: Payer: Medicaid Other | Attending: Oncology | Admitting: Physical Therapy

## 2013-05-21 DIAGNOSIS — IMO0001 Reserved for inherently not codable concepts without codable children: Secondary | ICD-10-CM | POA: Insufficient documentation

## 2013-05-21 DIAGNOSIS — I890 Lymphedema, not elsewhere classified: Secondary | ICD-10-CM | POA: Insufficient documentation

## 2013-05-23 ENCOUNTER — Ambulatory Visit: Payer: Medicaid Other

## 2013-05-25 ENCOUNTER — Ambulatory Visit: Payer: Medicaid Other

## 2013-05-30 ENCOUNTER — Ambulatory Visit: Payer: Medicaid Other

## 2013-05-31 ENCOUNTER — Ambulatory Visit: Payer: Medicaid Other

## 2013-06-07 ENCOUNTER — Ambulatory Visit: Payer: Medicaid Other

## 2013-06-07 ENCOUNTER — Ambulatory Visit (INDEPENDENT_AMBULATORY_CARE_PROVIDER_SITE_OTHER): Payer: Self-pay | Admitting: General Surgery

## 2013-06-11 ENCOUNTER — Ambulatory Visit: Payer: Medicaid Other | Admitting: Physical Therapy

## 2013-06-12 ENCOUNTER — Ambulatory Visit: Payer: Medicaid Other | Admitting: *Deleted

## 2013-06-13 ENCOUNTER — Ambulatory Visit: Payer: Medicaid Other | Admitting: Physical Therapy

## 2013-06-14 ENCOUNTER — Encounter: Payer: Medicaid Other | Admitting: Physical Therapy

## 2013-06-15 ENCOUNTER — Ambulatory Visit: Payer: Medicaid Other

## 2013-06-18 ENCOUNTER — Ambulatory Visit: Payer: Medicaid Other

## 2013-06-19 ENCOUNTER — Other Ambulatory Visit: Payer: Self-pay | Admitting: *Deleted

## 2013-06-19 DIAGNOSIS — C50912 Malignant neoplasm of unspecified site of left female breast: Secondary | ICD-10-CM

## 2013-06-19 DIAGNOSIS — I10 Essential (primary) hypertension: Secondary | ICD-10-CM

## 2013-06-19 DIAGNOSIS — I2510 Atherosclerotic heart disease of native coronary artery without angina pectoris: Secondary | ICD-10-CM

## 2013-06-19 DIAGNOSIS — C50919 Malignant neoplasm of unspecified site of unspecified female breast: Secondary | ICD-10-CM

## 2013-06-19 MED ORDER — METHADONE HCL 5 MG PO TABS: 15.0000 mg | ORAL_TABLET | Freq: Three times a day (TID) | ORAL | Status: DC

## 2013-06-20 ENCOUNTER — Ambulatory Visit: Payer: Medicaid Other | Admitting: Physical Therapy

## 2013-06-21 ENCOUNTER — Ambulatory Visit: Payer: Medicaid Other | Attending: Oncology | Admitting: Physical Therapy

## 2013-06-21 DIAGNOSIS — IMO0001 Reserved for inherently not codable concepts without codable children: Secondary | ICD-10-CM | POA: Insufficient documentation

## 2013-06-21 DIAGNOSIS — I890 Lymphedema, not elsewhere classified: Secondary | ICD-10-CM | POA: Insufficient documentation

## 2013-06-27 ENCOUNTER — Ambulatory Visit: Payer: Medicaid Other

## 2013-06-27 ENCOUNTER — Ambulatory Visit: Payer: Medicaid Other | Admitting: *Deleted

## 2013-07-08 ENCOUNTER — Emergency Department (HOSPITAL_COMMUNITY): Payer: Medicaid Other

## 2013-07-08 ENCOUNTER — Encounter (HOSPITAL_COMMUNITY): Payer: Self-pay

## 2013-07-08 ENCOUNTER — Inpatient Hospital Stay (HOSPITAL_COMMUNITY)
Admission: EM | Admit: 2013-07-08 | Discharge: 2013-07-10 | DRG: 287 | Disposition: A | Discharge status: Home / Self Care | Payer: Medicaid Other | Attending: Internal Medicine | Admitting: Internal Medicine

## 2013-07-08 DIAGNOSIS — I2510 Atherosclerotic heart disease of native coronary artery without angina pectoris: Principal | ICD-10-CM

## 2013-07-08 DIAGNOSIS — Z9010 Acquired absence of unspecified breast and nipple: Secondary | ICD-10-CM

## 2013-07-08 DIAGNOSIS — Z7982 Long term (current) use of aspirin: Secondary | ICD-10-CM

## 2013-07-08 DIAGNOSIS — E785 Hyperlipidemia, unspecified: Secondary | ICD-10-CM | POA: Diagnosis present

## 2013-07-08 DIAGNOSIS — E876 Hypokalemia: Secondary | ICD-10-CM | POA: Diagnosis present

## 2013-07-08 DIAGNOSIS — R079 Chest pain, unspecified: Secondary | ICD-10-CM | POA: Diagnosis present

## 2013-07-08 DIAGNOSIS — F1120 Opioid dependence, uncomplicated: Secondary | ICD-10-CM | POA: Diagnosis present

## 2013-07-08 DIAGNOSIS — Z9861 Coronary angioplasty status: Secondary | ICD-10-CM

## 2013-07-08 DIAGNOSIS — R9430 Abnormal result of cardiovascular function study, unspecified: Secondary | ICD-10-CM

## 2013-07-08 DIAGNOSIS — C50919 Malignant neoplasm of unspecified site of unspecified female breast: Secondary | ICD-10-CM

## 2013-07-08 DIAGNOSIS — R5381 Other malaise: Secondary | ICD-10-CM | POA: Diagnosis present

## 2013-07-08 DIAGNOSIS — I200 Unstable angina: Secondary | ICD-10-CM

## 2013-07-08 DIAGNOSIS — I10 Essential (primary) hypertension: Secondary | ICD-10-CM | POA: Diagnosis present

## 2013-07-08 DIAGNOSIS — G894 Chronic pain syndrome: Secondary | ICD-10-CM | POA: Diagnosis present

## 2013-07-08 DIAGNOSIS — I249 Acute ischemic heart disease, unspecified: Secondary | ICD-10-CM | POA: Diagnosis present

## 2013-07-08 DIAGNOSIS — R309 Painful micturition, unspecified: Secondary | ICD-10-CM

## 2013-07-08 DIAGNOSIS — M1990 Unspecified osteoarthritis, unspecified site: Secondary | ICD-10-CM

## 2013-07-08 DIAGNOSIS — I252 Old myocardial infarction: Secondary | ICD-10-CM

## 2013-07-08 DIAGNOSIS — Z01810 Encounter for preprocedural cardiovascular examination: Secondary | ICD-10-CM

## 2013-07-08 DIAGNOSIS — R509 Fever, unspecified: Secondary | ICD-10-CM

## 2013-07-08 DIAGNOSIS — D649 Anemia, unspecified: Secondary | ICD-10-CM

## 2013-07-08 DIAGNOSIS — I509 Heart failure, unspecified: Secondary | ICD-10-CM | POA: Diagnosis present

## 2013-07-08 DIAGNOSIS — D696 Thrombocytopenia, unspecified: Secondary | ICD-10-CM

## 2013-07-08 DIAGNOSIS — G8929 Other chronic pain: Secondary | ICD-10-CM

## 2013-07-08 DIAGNOSIS — Z853 Personal history of malignant neoplasm of breast: Secondary | ICD-10-CM

## 2013-07-08 DIAGNOSIS — F17200 Nicotine dependence, unspecified, uncomplicated: Secondary | ICD-10-CM | POA: Diagnosis present

## 2013-07-08 DIAGNOSIS — R0602 Shortness of breath: Secondary | ICD-10-CM

## 2013-07-08 LAB — CBC WITH DIFFERENTIAL/PLATELET
Basophils Absolute: 0 10*3/uL (ref 0.0–0.1)
Basophils Relative: 0 % (ref 0–1)
HCT: 38.8 % (ref 36.0–46.0)
Hemoglobin: 13.2 g/dL (ref 12.0–15.0)
Lymphocytes Relative: 26 % (ref 12–46)
MCHC: 34 g/dL (ref 30.0–36.0)
Monocytes Relative: 5 % (ref 3–12)
Neutro Abs: 5.1 10*3/uL (ref 1.7–7.7)
Neutrophils Relative %: 67 % (ref 43–77)
RDW: 15 % (ref 11.5–15.5)
WBC: 7.6 10*3/uL (ref 4.0–10.5)

## 2013-07-08 LAB — BASIC METABOLIC PANEL
CO2: 25 mEq/L (ref 19–32)
Chloride: 103 mEq/L (ref 96–112)
GFR calc Af Amer: 62 mL/min — ABNORMAL LOW (ref >90)
Potassium: 3.1 mEq/L — ABNORMAL LOW (ref 3.5–5.1)

## 2013-07-08 LAB — POCT I-STAT TROPONIN I: Troponin i, poc: 0.01 ng/mL (ref 0.00–0.08)

## 2013-07-08 LAB — PRO B NATRIURETIC PEPTIDE: Pro B Natriuretic peptide (BNP): 116.1 pg/mL (ref 0–125)

## 2013-07-08 MED ORDER — TRIAMTERENE-HCTZ 75-50 MG PO TABS
1.0000 | ORAL_TABLET | Freq: Every day | ORAL | Status: DC
  Administered 2013-07-08: 1 via ORAL
  Filled 2013-07-08 (×2): qty 1

## 2013-07-08 MED ORDER — FUROSEMIDE 10 MG/ML IJ SOLN
40.0000 mg | Freq: Once | INTRAMUSCULAR | Status: AC
  Administered 2013-07-08: 40 mg via INTRAVENOUS
  Filled 2013-07-08: qty 4

## 2013-07-08 MED ORDER — NITROGLYCERIN IN D5W 200-5 MCG/ML-% IV SOLN
5.0000 ug/min | Freq: Once | INTRAVENOUS | Status: AC
  Administered 2013-07-09: 5 ug/min via INTRAVENOUS
  Filled 2013-07-08: qty 250

## 2013-07-08 MED ORDER — POTASSIUM CHLORIDE CRYS ER 20 MEQ PO TBCR
40.0000 meq | EXTENDED_RELEASE_TABLET | Freq: Once | ORAL | Status: AC
  Administered 2013-07-08: 40 meq via ORAL
  Filled 2013-07-08: qty 2
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
## 2013-07-08 NOTE — ED Provider Notes (Signed)
Medical screening examination/treatment/procedure(s) were performed by non-physician practitioner and as supervising physician I was immediately available for consultation/collaboration.    Nelia Shi, MD 07/08/13 2036

## 2013-07-08 NOTE — ED Notes (Signed)
Patient ambulated to bathroom.  On return patient stated she started having same chest pain.  Dr Nedra Hai at bedside.

## 2013-07-08 NOTE — ED Notes (Addendum)
Patient woke up out of her sleep with palpitations. Had chest pain (7/10) with SOB. Took an aspirin and 1 nitroglycerin.  Pain eased up after the nitro.patient had the same episode about 1 year ago. Had another episode about 7 months prior to this one. Had an MI in 2009. Patient feels a little better now but still feels as though her heart is fluttering. Has not taken her BP medication for 2 days. She says that she feels full in her chest. Has been diaphoretic, nauseous and 1 episode of emesis.
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
## 2013-07-08 NOTE — ED Provider Notes (Signed)
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 female complaining with past medical history significant for ACS, 2 stents, CHF (on imdur, Myoview in 08/08/2012 shows ejection fraction of 57% with wall motion abnormality), active breast cancer chemotherapy with tamoxifen, smoker with hyperlipidemia and hypertension complaining of acute onset of left-sided chest pain associated with nausea and shortness of breath at 3 AM last night. Pain was relieved by nitroglycerin, patient went back to sleep and woke up with similar pain. EKG is nonischemic, troponin her negative, chest x-ray shows pulmonary vascular congestion with no edema or effusion. Labs are unremarkable for mild hypokalemia 3.0 which is been repleted orally. She has not seen her cardiologist in one year.  This is a shared visit with attending Dr. Radford Pax.   Cardiology consult from Dr. Mayford Knife appreciated: She recommends a medical admission.  Patient will be [facility] to [facility] Dr. Conley Rolls.    Joni Reining Manson [place], PA-C 07/08/13 2353

## 2013-07-09 ENCOUNTER — Encounter (HOSPITAL_COMMUNITY): Payer: Self-pay

## 2013-07-09 ENCOUNTER — Emergency Department (HOSPITAL_COMMUNITY): Payer: Medicaid Other

## 2013-07-09 ENCOUNTER — Encounter (HOSPITAL_COMMUNITY)
Admission: EM | Disposition: A | Discharge status: Home / Self Care | Payer: Self-pay | Source: Home / Self Care | Attending: Internal Medicine

## 2013-07-09 ENCOUNTER — Inpatient Hospital Stay (HOSPITAL_COMMUNITY): Payer: Medicaid Other

## 2013-07-09 DIAGNOSIS — I2510 Atherosclerotic heart disease of native coronary artery without angina pectoris: Secondary | ICD-10-CM

## 2013-07-09 DIAGNOSIS — R079 Chest pain, unspecified: Secondary | ICD-10-CM

## 2013-07-09 HISTORY — PX: LEFT HEART CATHETERIZATION WITH CORONARY ANGIOGRAM: SHX5451

## 2013-07-09 LAB — BASIC METABOLIC PANEL
BUN: 16 mg/dL (ref 6–23)
CO2: 22 mEq/L (ref 19–32)
Calcium: 9.2 mg/dL (ref 8.4–10.5)
Creatinine, Ser: 1.16 mg/dL — ABNORMAL HIGH (ref 0.50–1.10)
Glucose, Bld: 85 mg/dL (ref 70–99)

## 2013-07-09 LAB — MRSA PCR SCREENING: MRSA by PCR: NEGATIVE

## 2013-07-09 LAB — POCT ACTIVATED CLOTTING TIME: Activated Clotting Time: 145 seconds

## 2013-07-09 SURGERY — LEFT HEART CATHETERIZATION WITH CORONARY ANGIOGRAM
Anesthesia: LOCAL

## 2013-07-09 MED ORDER — TRIAMTERENE-HCTZ 75-50 MG PO TABS
1.0000 | ORAL_TABLET | Freq: Every day | ORAL | Status: DC
  Administered 2013-07-09 – 2013-07-10 (×2): 1 via ORAL
  Filled 2013-07-09 (×2): qty 1

## 2013-07-09 MED ORDER — SODIUM CHLORIDE 0.9 % IV SOLN
250.0000 mL | INTRAVENOUS | Status: DC | PRN
  Administered 2013-07-09: 250 mL via INTRAVENOUS

## 2013-07-09 MED ORDER — GI COCKTAIL ~~LOC~~
30.0000 mL | Freq: Three times a day (TID) | ORAL | Status: DC | PRN
  Filled 2013-07-09: qty 30

## 2013-07-09 MED ORDER — SODIUM CHLORIDE 0.9 % IV SOLN: 250.0000 mL | INTRAVENOUS | Status: DC | PRN

## 2013-07-09 MED ORDER — ASPIRIN 81 MG PO CHEW: 324.0000 mg | CHEWABLE_TABLET | ORAL | Status: DC

## 2013-07-09 MED ORDER — NITROGLYCERIN 0.2 MG/ML ON CALL CATH LAB
INTRAVENOUS | Status: AC
  Filled 2013-07-09: qty 1

## 2013-07-09 MED ORDER — SODIUM CHLORIDE 0.9 % IJ SOLN
3.0000 mL | Freq: Two times a day (BID) | INTRAMUSCULAR | Status: DC
  Administered 2013-07-09: 3 mL via INTRAVENOUS

## 2013-07-09 MED ORDER — ASPIRIN 325 MG PO TABS
325.0000 mg | ORAL_TABLET | Freq: Every day | ORAL | Status: DC
  Administered 2013-07-09 – 2013-07-10 (×2): 325 mg via ORAL
  Filled 2013-07-09 (×2): qty 1

## 2013-07-09 MED ORDER — IOHEXOL 350 MG/ML SOLN
100.0000 mL | Freq: Once | INTRAVENOUS | Status: AC | PRN
  Administered 2013-07-09: 100 mL via INTRAVENOUS

## 2013-07-09 MED ORDER — METOPROLOL SUCCINATE ER 100 MG PO TB24
100.0000 mg | ORAL_TABLET | Freq: Every day | ORAL | Status: DC
  Administered 2013-07-09 – 2013-07-10 (×2): 100 mg via ORAL
  Filled 2013-07-09 (×2): qty 1

## 2013-07-09 MED ORDER — ONDANSETRON HCL 4 MG/2ML IJ SOLN
4.0000 mg | Freq: Four times a day (QID) | INTRAMUSCULAR | Status: DC | PRN
  Filled 2013-07-09: qty 2

## 2013-07-09 MED ORDER — HEPARIN (PORCINE) IN NACL 2-0.9 UNIT/ML-% IJ SOLN
INTRAMUSCULAR | Status: AC
  Filled 2013-07-09: qty 1000

## 2013-07-09 MED ORDER — SODIUM CHLORIDE 0.9 % IJ SOLN
3.0000 mL | Freq: Two times a day (BID) | INTRAMUSCULAR | Status: DC
Start: 2013-07-09 — End: 2013-07-09
  Administered 2013-07-09: 3 mL via INTRAVENOUS

## 2013-07-09 MED ORDER — ONDANSETRON HCL 4 MG PO TABS: 4.0000 mg | ORAL_TABLET | Freq: Four times a day (QID) | ORAL | Status: DC | PRN

## 2013-07-09 MED ORDER — LIDOCAINE HCL (PF) 1 % IJ SOLN
INTRAMUSCULAR | Status: AC
  Filled 2013-07-09: qty 30

## 2013-07-09 MED ORDER — SODIUM CHLORIDE 0.9 % IJ SOLN: 3.0000 mL | INTRAMUSCULAR | Status: DC | PRN

## 2013-07-09 MED ORDER — METOPROLOL SUCCINATE ER 100 MG PO TB24
100.0000 mg | ORAL_TABLET | ORAL | Status: AC
  Administered 2013-07-09: 100 mg via ORAL
  Filled 2013-07-09 (×2): qty 1

## 2013-07-09 MED ORDER — FENTANYL CITRATE 0.05 MG/ML IJ SOLN
INTRAMUSCULAR | Status: AC
  Filled 2013-07-09: qty 2

## 2013-07-09 MED ORDER — ACETAMINOPHEN 325 MG PO TABS
650.0000 mg | ORAL_TABLET | ORAL | Status: DC | PRN
  Administered 2013-07-09: 650 mg via ORAL
  Filled 2013-07-09: qty 2

## 2013-07-09 MED ORDER — DIAZEPAM 5 MG PO TABS
5.0000 mg | ORAL_TABLET | ORAL | Status: AC
  Administered 2013-07-09: 5 mg via ORAL
  Filled 2013-07-09: qty 1

## 2013-07-09 MED ORDER — ISOSORBIDE MONONITRATE ER 60 MG PO TB24
60.0000 mg | ORAL_TABLET | Freq: Every morning | ORAL | Status: DC
  Administered 2013-07-09 – 2013-07-10 (×2): 60 mg via ORAL
  Filled 2013-07-09 (×2): qty 1

## 2013-07-09 MED ORDER — POTASSIUM CHLORIDE CRYS ER 20 MEQ PO TBCR
20.0000 meq | EXTENDED_RELEASE_TABLET | Freq: Two times a day (BID) | ORAL | Status: DC
  Administered 2013-07-09 – 2013-07-10 (×4): 20 meq via ORAL
  Filled 2013-07-09 (×6): qty 1

## 2013-07-09 MED ORDER — SODIUM CHLORIDE 0.9 % IV SOLN: INTRAVENOUS | Status: AC

## 2013-07-09 MED ORDER — HEPARIN (PORCINE) IN NACL 100-0.45 UNIT/ML-% IJ SOLN
1100.0000 [IU]/h | INTRAMUSCULAR | Status: DC
  Administered 2013-07-09: 1100 [IU]/h via INTRAVENOUS
  Filled 2013-07-09 (×3): qty 250

## 2013-07-09 MED ORDER — IRBESARTAN 300 MG PO TABS
300.0000 mg | ORAL_TABLET | Freq: Every day | ORAL | Status: DC
  Administered 2013-07-09 – 2013-07-10 (×2): 300 mg via ORAL
  Filled 2013-07-09 (×2): qty 1

## 2013-07-09 MED ORDER — MORPHINE SULFATE 2 MG/ML IJ SOLN
2.0000 mg | INTRAMUSCULAR | Status: DC | PRN
  Administered 2013-07-09: 18:00:00 2 mg via INTRAVENOUS
  Filled 2013-07-09: qty 1

## 2013-07-09 MED ORDER — HEPARIN SODIUM (PORCINE) 5000 UNIT/ML IJ SOLN
5000.0000 [IU] | Freq: Three times a day (TID) | INTRAMUSCULAR | Status: DC
  Administered 2013-07-09 – 2013-07-10 (×2): 5000 [IU] via SUBCUTANEOUS
  Filled 2013-07-09 (×5): qty 1

## 2013-07-09 MED ORDER — HEPARIN BOLUS VIA INFUSION
4000.0000 [IU] | Freq: Once | INTRAVENOUS | Status: AC
  Administered 2013-07-09: 4000 [IU] via INTRAVENOUS

## 2013-07-09 MED ORDER — MIDAZOLAM HCL 2 MG/2ML IJ SOLN
INTRAMUSCULAR | Status: AC
  Filled 2013-07-09: qty 2

## 2013-07-09 MED ORDER — PANTOPRAZOLE SODIUM 40 MG PO TBEC
40.0000 mg | DELAYED_RELEASE_TABLET | Freq: Every day | ORAL | Status: DC
  Administered 2013-07-09: 21:00:00 40 mg via ORAL
  Filled 2013-07-09: qty 1

## 2013-07-09 MED ORDER — ATORVASTATIN CALCIUM 20 MG PO TABS
20.0000 mg | ORAL_TABLET | Freq: Every day | ORAL | Status: DC
  Administered 2013-07-09 – 2013-07-10 (×2): 20 mg via ORAL
  Filled 2013-07-09 (×2): qty 1

## 2013-07-09 MED ORDER — DOCUSATE SODIUM 100 MG PO CAPS
100.0000 mg | ORAL_CAPSULE | Freq: Two times a day (BID) | ORAL | Status: DC
  Administered 2013-07-09 – 2013-07-10 (×3): 100 mg via ORAL
  Filled 2013-07-09 (×6): qty 1

## 2013-07-09 NOTE — CV Procedure (Signed)
  Cardiac Catheterization Procedure Note  Name: [name] MRN: [id number] DOB: 11-Mar-1961  Procedure: Left Heart Cath, Selective Coronary Angiography, LV angiography  Indication:   Chest pain suggestive of unstable angina with previous LAD stent.   Procedural details: The right groin was prepped, draped, and anesthetized with 1% lidocaine. Using modified Seldinger technique, a 5 French sheath was introduced into the right femoral artery. Standard Judkins catheters were used for coronary angiography and left ventriculography. Catheter exchanges were performed over a guidewire. There were no immediate procedural complications. The patient was transferred to the post catheterization recovery area for further monitoring.  Procedural Findings:   Hemodynamics:     AO 107/69    LV 107/6   Coronary angiography:   Coronary dominance: Right  Left mainstem:   Short and normal  Left anterior descending (LAD):   Large vessel wrapping the apex with mid stent widely patent. D1 small and normal.  Mid diagonal proximal to the stent widely patent.    Left circumflex (LCx):  AV groove proximal 25%. Distal AV groove long 60% before the PLs.   MOM1 moderate sized with long proximal 25% stenosis.  MOM2 large and normal.  PL x 2 small and normal.    Right coronary artery (RCA):  Long mid 50% stenosis unchanged from previous.  PDA moderate sized and normal.  PL small and normal.   Left ventriculography: Left ventricular systolic function is normal, LVEF is estimated at 65%, there is no significant mitral regurgitation   Final Conclusions:  Nonobstructive CAD.  Preserved EF  Recommendations:   Medical management.   Artie Takayama 07/09/2013, 3:03 PM

## 2013-07-09 NOTE — Progress Notes (Signed)
[facility] progress note. Chief complaint. Transfer note. History of present illness. This 52 year old female presented to [facility] long emergency room with complaints of shortness of breath and chest pain with pain into the left arm. ER evaluation was unremarkable with EKG and first troponin negative. The patient was felt to require admission for chest pain and to rule out acute coronary syndrome. She has known coronary artery disease with RCA restenosis. She was initiated on IV heparin and IV nitroglycerin. She was felt to require transfer to [facility] in case the patient should require cardiac cath. The patient was discussed with Doctor Mayford Knife of cardiology and plan is for cardiology consult later this a.m. I'm seeing the patient at bedside to ensure she remains stable post transfer and her orders transferred appropriately. She is currently not experiencing any chest pain or shortness of breath. Vital signs. Temperature 98.7, pulse 92, respiration 22, blood pressure 158/100. O2 sats 98%. General appearance. This is an obese middle-aged female who is alert, cooperative, and in no distress. Cardiac. Rate and rhythm regular. No jugular venous distention or edema. Lungs. Breath sounds somewhat reduced in the bases but clear without distress. Abdomen. Soft obese with positive bowel sounds. No pain. Extremities. No significant edema. No calf pain and negative Homans. Impression/plan. Problem #1 patient initiated on IV heparin and IV nitroglycerin as well as beta blocker and statin. Home aspirin also continued. Patient currently n.p.o. in case there is a need for cardiac catheterization. Cardiology will see him formal consult later this a.m. Problem #2. Hypertension. Patient continues on her home medications including beta blocker Toprol-XL. Problem #3. Hyperlipidemia. Patient continues on her home statin. The patient appears clinically stable post transfer to [facility] All orders  appear to of transferred appropriately.

## 2013-07-09 NOTE — Progress Notes (Signed)
[facility] progress note. Chief complaint. Transfer note. History of present illness. This 53 year old female presented to [facility] long emergency room with complaints of shortness of breath and chest pain with pain into the left arm. ER evaluation was unremarkable with EKG and first troponin negative. The patient was felt to require admission for chest pain and to rule out acute coronary syndrome. She has known coronary artery disease with RCA restenosis. She was initiated on IV heparin and IV nitroglycerin. She was felt to require transfer to [facility] in case the patient should require cardiac cath. The patient was discussed with Doctor Mayford Knife of cardiology and plan is for cardiology consult later this a.m. I'm seeing the patient at bedside to ensure she remains stable post transfer and her orders transferred appropriately. She is currently not experiencing any chest pain or shortness of breath. Vital signs. Temperature 98.7, pulse 92, respiration 22, blood pressure 158/100. O2 sats 98%. General appearance. This is an obese middle-aged female who is alert, cooperative, and in no distress. Cardiac. Rate and rhythm regular. No jugular venous distention or edema. Lungs. Breath sounds somewhat reduced in the bases but clear without distress. Abdomen. Soft obese with positive bowel sounds. No pain. Extremities. No significant edema. No calf pain and negative Homans. Impression/plan. Problem #1 patient initiated on IV heparin and IV nitroglycerin as well as beta blocker and statin. Home aspirin also continued. Patient currently n.p.o. in case there is a need for cardiac catheterization. Cardiology will see him formal consult later this a.m. Problem #2. Hypertension. Patient continues on her home medications including beta blocker Toprol-XL. Problem #3. Hyperlipidemia. Patient continues on her home statin. The patient appears clinically stable post transfer to [facility]. All orders  appear to of transferred appropriately.   Agree with above assessment and plan. Author: Lynden Oxford, MD [facility] Pager: [phone] 07/09/2013 3:17 AM

## 2013-07-09 NOTE — Care Management Note (Signed)
    Page 1 of 1   07/09/2013     2:50:45 PM   CARE MANAGEMENT NOTE 07/09/2013  Patient:  [name], [name]   Account Number:  [id number]  Date Initiated:  07/09/2013  Documentation initiated by:  Junius Creamer  Subjective/Objective Assessment:   adm w ch pain     Action/Plan:   lives w fam, pcp dr Leane Para   Anticipated DC Date:     Anticipated DC Plan:        DC Planning Services  CM consult      Choice offered to / List presented to:             Status of service:   Medicare Important Message given?   (If response is "NO", the following Medicare IM given date fields will be blank) Date Medicare IM given:   Date Additional Medicare IM given:    Discharge Disposition:    Per UR Regulation:  Reviewed for med. necessity/level of care/duration of stay  If discussed at Long Length of Stay Meetings, dates discussed:    Comments:

## 2013-07-09 NOTE — ED Provider Notes (Signed)
Medical screening examination/treatment/procedure(s) were conducted as a shared visit with non-physician practitioner(s) and myself.  I personally evaluated the patient during the encounter   Nelia Shi, MD 07/09/13 2351

## 2013-07-09 NOTE — Progress Notes (Signed)
Subjective:  Patient is still having mild chest pain.  CT angio negative for PE. Troponins negative.  Objective:  Vital Signs in the last 24 hours: Temp:  [98 F (36.7 C)-98.7 F (37.1 C)] 98 F (36.7 C) (07/21 0735) Pulse Rate:  [73-100] 86 (07/21 0735) Resp:  [16-24] 18 (07/21 0735) BP: (106-165)/(62-110) 120/73 mmHg (07/21 0735) SpO2:  [94 %-99 %] 98 % (07/21 0735) Weight:  [247 lb 2.2 oz (112.1 kg)-252 lb 11.2 oz (114.624 kg)] 247 lb 2.2 oz (112.1 kg) (07/21 0222)  Intake/Output from previous day: 07/20 0701 - 07/21 0700 In: 201.5 [P.O.:120; I.V.:81.5] Out: 1300 [Urine:1300] Intake/Output from this shift:    . aspirin  325 mg Oral Daily  . atorvastatin  20 mg Oral Daily  . docusate sodium  100 mg Oral BID  . irbesartan  300 mg Oral Daily  . isosorbide mononitrate  60 mg Oral q morning - 10a  . metoprolol succinate  100 mg Oral Daily  . potassium chloride SA  20 mEq Oral BID  . sodium chloride  3 mL Intravenous Q12H  . sodium chloride  3 mL Intravenous Q12H  . triamterene-hydrochlorothiazide  1 tablet Oral Daily  . triamterene-hydrochlorothiazide  1 tablet Oral Daily   . heparin 1,100 Units/hr (07/09/13 0700)    Physical Exam: The patient appears to be in no distress.  Head and neck exam reveals that the pupils are equal and reactive.  The extraocular movements are full.  There is no scleral icterus.  Mouth and pharynx are benign.  No lymphadenopathy.  No carotid bruits.  The jugular venous pressure is normal.  Thyroid is not enlarged or tender.  Chest is clear to percussion and auscultation.  No rales or rhonchi.  Expansion of the chest is symmetrical.  Heart reveals no abnormal lift or heave.  First and second heart sounds are normal.  There is no murmur gallop rub or click.  The abdomen is soft and nontender.  Bowel sounds are normoactive.  There is no hepatosplenomegaly or mass.  There are no abdominal bruits.  Extremities reveal no phlebitis or edema.   Pedal pulses are good.  There is no cyanosis or clubbing.  Neurologic exam is normal strength and no lateralizing weakness.  No sensory deficits.  Integument reveals no rash  Lab Results:  Recent Labs  07/08/13 1852  WBC 7.6  HGB 13.2  PLT 368    Recent Labs  07/08/13 1852  NA 138  K 3.1*  CL 103  CO2 25  GLUCOSE 85  BUN 17  CREATININE 1.17*    Recent Labs  07/09/13 0234  TROPONINI <0.30   Hepatic Function Panel No results found for this basename: PROT, ALBUMIN, AST, ALT, ALKPHOS, BILITOT, BILIDIR, IBILI,  in the last 72 hours No results found for this basename: CHOL,  in the last 72 hours No results found for this basename: PROTIME,  in the last 72 hours  Imaging: Dg Chest 2 View  07/08/2013   *RADIOLOGY REPORT*  Clinical Data: Shortness of breath.  CHEST - 2 VIEW  Comparison: PA and lateral chest 02/24/2013 and 11/11/2012.  Findings: Heart size is upper normal and there is some vascular congestion without frank edema.  No consolidative process, pneumothorax or effusion is identified.  Surgical clips left axilla are noted.  IMPRESSION: Pulmonary vascular congestion without frank edema.   Original Report Authenticated By: Holley Dexter, M.D.   Ct Angio Chest Pe W/cm &/or Wo Cm  07/09/2013   *RADIOLOGY  REPORT*  Clinical Data: Left-sided chest pain and shortness of breath. Palpitations and nausea.  CT ANGIOGRAPHY CHEST  Technique:  Multidetector CT imaging of the chest using the standard protocol during bolus administration of intravenous contrast. Multiplanar reconstructed images including MIPs were obtained and reviewed to evaluate the vascular anatomy.  Contrast: OMNIPAQUE IOHEXOL 350 MG/ML SOLN  Comparison: 08/22/2011  Findings: Technically adequate study with moderately good opacification of the central and segmental pulmonary arteries. Peripheral vessels are not well opacified and smaller emboli may be obscured.  No significant central filling defects  demonstrated.  No evidence of significant pulmonary embolus.  Normal heart size.  Normal caliber thoracic aorta.  Calcification in the aorta and coronary arteries.  Esophagus is decompressed.  No significant lymphadenopathy in the chest.  Thyroid gland appears homogeneous.  Minimal right pleural effusion.  Visualized portions of the upper abdominal organs are grossly unremarkable. Postoperative  changes with bilateral mastectomies.  Mild atelectasis in the lung bases.  This is likely to be dependent.  No focal consolidation or airspace disease.  No pneumothorax.  Airways appear patent.  Degenerative changes in the thoracic spine.  No significant change since previous study.  IMPRESSION: No evidence of significant pulmonary embolus.  No evidence of active pulmonary disease.   Original Report Authenticated By: Burman Nieves, M.D.    Cardiac Studies: Telemetry shows NSR Assessment/Plan:  1. Chest pain - atypical intermittent for 24 hours. Sharper in nature than what she had with her MI. EKG is nonischemic and initial enzymes are normal. 2. CAD s/p remote STEMI with PCI of RCA and evidence of 60% instent restenosis at time of most recent cath. Also PCI of LAD.  3. Dyslipidemia  4. Hypertensive urgency due to missing BP meds for 3 days  5. Hypokalemia  Plan: Keep NPO for cath today. Recheck BMET and replete K as necessary.  LOS: 1 day    Cassell Clement 07/09/2013, 8:19 AM
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
## 2013-07-09 NOTE — Progress Notes (Signed)
[facility] Progress Note [place] TEAM 1 - Stepdown/ICU TEAM   [name]:[id number] DOB: 07/22/1961 DOA: 07/08/2013 PCP: Jaclyn Shaggy, MD  Brief narrative: 52 y.o. female with hx of breast cancer, s/p mastectomy and chemotherapy, known CAD with STEMI 2008, with baremetal stent to RCA, DES to LAD 2009 ( with 60% in-stent restenosis of mid RCA), HTN, hyperlipidemia, chronic pain syndrome on PRN methadone, who woke up at 0300 hours with substernal chest pain similar to her STEMI, radiating to her left arm, with associated shortness of breath and diaphoresis. She took NTG with some improvement of her CP, but it persisted, thus she presented to the ER.   Evaluation in the ER included an EKG which was unremarkable and negative troponin. She had normal CBC with Hb of 13, and creatinine of 1.17. CXR showed mild vascular congestion. In the ER, she continued to have chest pain 6/10, and her BP was 150/125. She missed about 3 days of her medications.    Assessment/Plan:  Chest pain CT angio negative for PE - Troponins negative - EKG is nonischemic - cath w/ non-occlusive disease  Hypertension w/ HTN urgency BP much improved  Dyslipidemia  Breast cancer  Physical deconditioning  Chronic pain on methadone   Code Status: FULL Family Communication: no family present at time of exam Disposition Plan: post-cath SDU  Consultants: Cardiology  Procedures: 7/21 - cardiac cath - nonobstructive CAD - preserved EF  Antibiotics: none  DVT prophylaxis: SQ heparin  HPI/Subjective: Pt seen for f/u visit.  Objective: Blood pressure 117/82, pulse 68, temperature 97.7 F (36.5 C), temperature source Oral, resp. rate 18, height 5\' 5"  (1.651 m), weight 112.1 kg (247 lb 2.2 oz), last menstrual period 07/16/2011, SpO2 98.00%.  Intake/Output Summary (Last 24 hours) at 07/09/13 1640 Last data filed at 07/09/13 1415  Gross per 24 hour  Intake  351.2 ml  Output   1550 ml  Net  -1198.8 ml   Exam: F/U exam completed  Data Reviewed: Basic Metabolic Panel:  Recent Labs Lab 07/08/13 1852 07/09/13 0820  NA 138 136  K 3.1* 3.3*  CL 103 98  CO2 25 22  GLUCOSE 85 85  BUN 17 16  CREATININE 1.17* 1.16*  CALCIUM 8.8 9.2   CBC:  Recent Labs Lab 07/08/13 1852  WBC 7.6  NEUTROABS 5.1  HGB 13.2  HCT 38.8  MCV 88.6  PLT 368   Cardiac Enzymes:  Recent Labs Lab 07/09/13 0234 07/09/13 0820  TROPONINI <0.30 <0.30   BNP (last 3 results)  Recent Labs  11/11/12 2125 07/08/13 1852  PROBNP 48.4 116.1   CBG: No results found for this basename: GLUCAP,  in the last 168 hours  Recent Results (from the past 240 hour(s))  MRSA PCR SCREENING     Status: None   Collection Time    07/09/13  2:21 AM      Result Value Range Status   MRSA by PCR NEGATIVE  NEGATIVE Final   Comment:            The GeneXpert MRSA Assay (FDA     approved for NASAL specimens     only), is one component of a     comprehensive MRSA colonization     surveillance program. It is not     intended to diagnose MRSA     infection nor to guide or     monitor treatment for     MRSA infections.     Studies:  Recent  x-ray studies have been reviewed in detail by the Attending Physician  Scheduled Meds:  Scheduled Meds: . aspirin  325 mg Oral Daily  . atorvastatin  20 mg Oral Daily  . docusate sodium  100 mg Oral BID  . heparin  5,000 Units Subcutaneous Q8H  . irbesartan  300 mg Oral Daily  . isosorbide mononitrate  60 mg Oral q morning - 10a  . metoprolol succinate  100 mg Oral Daily  . potassium chloride SA  20 mEq Oral BID  . sodium chloride  3 mL Intravenous Q12H  . sodium chloride  3 mL Intravenous Q12H  . triamterene-hydrochlorothiazide  1 tablet Oral Daily   Time spent on care of this patient: 25+ mins   [facility] T  [facility] Office  [phone] Pager - Text Page per Loretha Stapler as per below:  On-Call/Text Page:      [email or web address]      password  [email or web address]  If 7PM-7AM, please contact night-coverage [email or web address] Password [email or web address] 07/09/2013, 4:40 PM   LOS: 1 day

## 2013-07-10 DIAGNOSIS — C50919 Malignant neoplasm of unspecified site of unspecified female breast: Secondary | ICD-10-CM

## 2013-07-10 DIAGNOSIS — D649 Anemia, unspecified: Secondary | ICD-10-CM

## 2013-07-10 NOTE — Progress Notes (Signed)
Subjective:  Patient doing well. No further chest pain.  Cath yesterday showed nonobstructive disease.  Objective:  Vital Signs in the last 24 hours: Temp:  [97.7 F (36.5 C)-99 F (37.2 C)] 98.2 F (36.8 C) (07/22 0626) Pulse Rate:  [66-77] 66 (07/22 0626) Resp:  [15-20] 16 (07/22 0626) BP: (96-164)/(47-82) 106/57 mmHg (07/22 0626) SpO2:  [90 %-99 %] 96 % (07/22 0626) Weight:  [248 lb 0.3 oz (112.5 kg)] 248 lb 0.3 oz (112.5 kg) (07/22 0020)  Intake/Output from previous day: 07/21 0701 - 07/22 0700 In: 1057.3 [P.O.:600; I.V.:457.3] Out: 1550 [Urine:1550] Intake/Output from this shift:    . aspirin  325 mg Oral Daily  . atorvastatin  20 mg Oral Daily  . docusate sodium  100 mg Oral BID  . heparin  5,000 Units Subcutaneous Q8H  . irbesartan  300 mg Oral Daily  . isosorbide mononitrate  60 mg Oral q morning - 10a  . metoprolol succinate  100 mg Oral Daily  . pantoprazole  40 mg Oral QHS  . potassium chloride SA  20 mEq Oral BID  . triamterene-hydrochlorothiazide  1 tablet Oral Daily      Physical Exam: The patient appears to be in no distress.  Head and neck exam reveals that the pupils are equal and reactive.  The extraocular movements are full.  There is no scleral icterus.  Mouth and pharynx are benign.  No lymphadenopathy.  No carotid bruits.  The jugular venous pressure is normal.  Thyroid is not enlarged or tender.  Chest is clear to percussion and auscultation.  No rales or rhonchi.  Expansion of the chest is symmetrical.  Heart reveals no abnormal lift or heave.  First and second heart sounds are normal.  There is no murmur gallop rub or click.  The abdomen is soft and nontender.  Bowel sounds are normoactive.  There is no hepatosplenomegaly or mass.  There are no abdominal bruits. Groin reveals no hematoma.  Extremities reveal no phlebitis or edema.  Pedal pulses are good.  There is no cyanosis or clubbing.  Neurologic exam is normal strength and no  lateralizing weakness.  No sensory deficits.  Integument reveals no rash  Lab Results:  Recent Labs  07/08/13 1852  WBC 7.6  HGB 13.2  PLT 368    Recent Labs  07/08/13 1852 07/09/13 0820  NA 138 136  K 3.1* 3.3*  CL 103 98  CO2 25 22  GLUCOSE 85 85  BUN 17 16  CREATININE 1.17* 1.16*    Recent Labs  07/09/13 0234 07/09/13 0820  TROPONINI <0.30 <0.30   Hepatic Function Panel No results found for this basename: PROT, ALBUMIN, AST, ALT, ALKPHOS, BILITOT, BILIDIR, IBILI,  in the last 72 hours No results found for this basename: CHOL,  in the last 72 hours No results found for this basename: PROTIME,  in the last 72 hours  Imaging: Dg Chest 2 View  07/08/2013   *RADIOLOGY REPORT*  Clinical Data: Shortness of breath.  CHEST - 2 VIEW  Comparison: PA and lateral chest 02/24/2013 and 11/11/2012.  Findings: Heart size is upper normal and there is some vascular congestion without frank edema.  No consolidative process, pneumothorax or effusion is identified.  Surgical clips left axilla are noted.  IMPRESSION: Pulmonary vascular congestion without frank edema.   Original Report Authenticated By: Holley Dexter, M.D.   Ct Angio Chest Pe W/cm &/or Wo Cm  07/09/2013   *RADIOLOGY REPORT*  Clinical Data: Left-sided chest pain  and shortness of breath. Palpitations and nausea.  CT ANGIOGRAPHY CHEST  Technique:  Multidetector CT imaging of the chest using the standard protocol during bolus administration of intravenous contrast. Multiplanar reconstructed images including MIPs were obtained and reviewed to evaluate the vascular anatomy.  Contrast: OMNIPAQUE IOHEXOL 350 MG/ML SOLN  Comparison: 08/22/2011  Findings: Technically adequate study with moderately good opacification of the central and segmental pulmonary arteries. Peripheral vessels are not well opacified and smaller emboli may be obscured.  No significant central filling defects demonstrated.  No evidence of significant  pulmonary embolus.  Normal heart size.  Normal caliber thoracic aorta.  Calcification in the aorta and coronary arteries.  Esophagus is decompressed.  No significant lymphadenopathy in the chest.  Thyroid gland appears homogeneous.  Minimal right pleural effusion.  Visualized portions of the upper abdominal organs are grossly unremarkable. Postoperative  changes with bilateral mastectomies.  Mild atelectasis in the lung bases.  This is likely to be dependent.  No focal consolidation or airspace disease.  No pneumothorax.  Airways appear patent.  Degenerative changes in the thoracic spine.  No significant change since previous study.  IMPRESSION: No evidence of significant pulmonary embolus.  No evidence of active pulmonary disease.   Original Report Authenticated By: Burman Nieves, M.D.    Cardiac Studies: Telemetry shows NSR Assessment/Plan:  1. Chest pain - atypical  2. CAD s/p remote STEMI with PCI of RCA and evidence of 60% instent restenosis at time of most recent cath. Also PCI of LAD. Cath yesterday shows patent stent and nonobstructive disease. 3. Dyslipidemia  4. Hypertensive urgency due to missing BP meds for 3 days  5. Hypokalemia  Plan: No further cardiac tests needed this hospitalization. Okay for discharge. Cardiology followup with Dr. Myrtis Ser.   LOS: 2 days    Cassell Clement 07/10/2013, 8:12 AM

## 2013-07-10 NOTE — Discharge Summary (Signed)
Physician Discharge Summary  [name]:[id number] DOB: Sep 18, 1961 DOA: 07/08/2013  PCP: Jaclyn Shaggy, MD  Admit date: 07/08/2013 Discharge date: 07/10/2013  Time spent: 40 minutes  Discharge Diagnoses:  Principal Problem:   Chest pain Active Problems:   Hypertension   Dyslipidemia   Breast cancer   Physical deconditioning   ACS (acute coronary syndrome)   Discharge Condition: Stable  Diet recommendation: Heart healthy  Filed Weights   07/08/13 2333 07/09/13 0222 07/10/13 0020  Weight: 114.624 kg (252 lb 11.2 oz) 112.1 kg (247 lb 2.2 oz) 112.5 kg (248 lb 0.3 oz)    History of present illness:  52 y.o. female with hx of breast cancer, s/p mastectomy and chemotherapy, known CAD with STEMI 2008, with baremetal stent to RCA, DES to LAD 2009 ( with 60% in-stent restenosis of mid RCA), HTN, hyperlipidemia, chronic pain syndrome on PRN methadone, who woke up at 0300 hours with substernal chest pain similar to her STEMI, radiating to her left arm, with associated shortness of breath and diaphoresis. She took NTG with some improvement of her CP, but it persisted, thus she presented to the ER.    She had also missed 3 days of her home medications.   Hospital Course:  Chest pain  CT angio negative for PE - Troponins negative - EKG is nonischemic - cath w/ non-occlusive disease -okay to discharge from per cardiology service  Hypertension w/ HTN urgency  BP much improved after home meds resumed-patient states that she will go and pick up her medications from the pharmacy today  Dyslipidemia  Continue Lipitor  Breast cancer  Continue tamoxifen  Chronic pain on methadone   Procedures:  Cardiac cath 7/21  Consultations:  Cardiology  Discharge Exam: Filed Vitals:   07/09/13 2001 07/10/13 0020 07/10/13 0626 07/10/13 0814  BP: 149/54 96/47 106/57 143/80  Pulse: 71 73 66 66  Temp: 98.5 F (36.9 C) 98.7 F (37.1 C) 98.2 F (36.8 C) 98 F (36.7 C)  TempSrc: Oral  Oral Oral Oral  Resp: 15 16 16 16   Height:      Weight:  112.5 kg (248 lb 0.3 oz)    SpO2: 93% 90% 96%     General: Awake alert oriented x3 Cardiovascular: Regular rate and rhythm no murmurs rubs or gallop Respiratory: Clear to auscultation  Discharge Instructions      Discharge Orders   Future Appointments Provider Department Dept Phone   07/11/2013 12:00 PM Sylvester Harder [place], [place] [facility] [phone]   08/08/2013 3:15 PM Mariella Saa, MD [facility], [place] [phone]   11/01/2013 9:00 AM Delcie Roch [facility] [phone]   11/01/2013 10:00 AM Wl-Dg 4 (Chest) [facility] [phone]   11/08/2013 9:15 AM Amy Allegra Grana, PA-C [facility] [phone]   Future Orders Complete By Expires     Diet - low sodium heart healthy  As directed     Increase activity slowly  As directed         Medication List         aspirin 325 MG tablet  Take 325 mg by mouth daily.     atorvastatin 20 MG tablet  Commonly known as:  LIPITOR  Take 1 tablet (20 mg total) by mouth daily.     isosorbide mononitrate 60 MG 24 hr tablet  Commonly known as:  IMDUR  Take 60 mg by mouth every morning.  MAGNESIUM-ZINC PO  Take 1 tablet by mouth daily.     methadone 5 MG tablet  Commonly known as:  DOLOPHINE  Take 3 tablets (15 mg total) by mouth every 8 (eight) hours.     metoprolol succinate 100 MG 24 hr tablet  Commonly known as:  TOPROL-XL  Take 100 mg by mouth daily. Take with or immediately following a meal.     multivitamin with minerals Tabs  Take 1 tablet by mouth daily.     nitroGLYCERIN 0.4 MG SL tablet  Commonly known as:  NITROSTAT  Place 1 tablet (0.4 mg total) under the tongue every 5 (five) minutes as needed for chest pain.     potassium chloride SA 20 MEQ tablet  Commonly known as:  K-DUR,KLOR-CON  Take 20 mEq  by mouth 2 (two) times daily.     tamoxifen 20 MG tablet  Commonly known as:  NOLVADEX  Take 20 mg by mouth daily.     triamterene-hydrochlorothiazide 75-50 MG per tablet  Commonly known as:  MAXZIDE  Take 1 tablet by mouth daily.     valsartan 320 MG tablet  Commonly known as:  DIOVAN  Take 320 mg by mouth daily.       No Known Allergies    The results of significant diagnostics from this hospitalization (including imaging, microbiology, ancillary and laboratory) are listed below for reference.    Significant Diagnostic Studies: Dg Chest 2 View  07/08/2013   *RADIOLOGY REPORT*  Clinical Data: Shortness of breath.  CHEST - 2 VIEW  Comparison: PA and lateral chest 02/24/2013 and 11/11/2012.  Findings: Heart size is upper normal and there is some vascular congestion without frank edema.  No consolidative process, pneumothorax or effusion is identified.  Surgical clips left axilla are noted.  IMPRESSION: Pulmonary vascular congestion without frank edema.   Original Report Authenticated By: Holley Dexter, M.D.   Ct Angio Chest Pe W/cm &/or Wo Cm  07/09/2013   *RADIOLOGY REPORT*  Clinical Data: Left-sided chest pain and shortness of breath. Palpitations and nausea.  CT ANGIOGRAPHY CHEST  Technique:  Multidetector CT imaging of the chest using the standard protocol during bolus administration of intravenous contrast. Multiplanar reconstructed images including MIPs were obtained and reviewed to evaluate the vascular anatomy.  Contrast: OMNIPAQUE IOHEXOL 350 MG/ML SOLN  Comparison: 08/22/2011  Findings: Technically adequate study with moderately good opacification of the central and segmental pulmonary arteries. Peripheral vessels are not well opacified and smaller emboli may be obscured.  No significant central filling defects demonstrated.  No evidence of significant pulmonary embolus.  Normal heart size.  Normal caliber thoracic aorta.  Calcification in the aorta and coronary arteries.   Esophagus is decompressed.  No significant lymphadenopathy in the chest.  Thyroid gland appears homogeneous.  Minimal right pleural effusion.  Visualized portions of the upper abdominal organs are grossly unremarkable. Postoperative  changes with bilateral mastectomies.  Mild atelectasis in the lung bases.  This is likely to be dependent.  No focal consolidation or airspace disease.  No pneumothorax.  Airways appear patent.  Degenerative changes in the thoracic spine.  No significant change since previous study.  IMPRESSION: No evidence of significant pulmonary embolus.  No evidence of active pulmonary disease.   Original Report Authenticated By: Burman Nieves, M.D.    Microbiology: Recent Results (from the past 240 hour(s))  MRSA PCR SCREENING     Status: None   Collection Time    07/09/13  2:21 AM  Result Value Range Status   MRSA by PCR NEGATIVE  NEGATIVE Final   Comment:            The GeneXpert MRSA Assay (FDA     approved for NASAL specimens     only), is one component of a     comprehensive MRSA colonization     surveillance program. It is not     intended to diagnose MRSA     infection nor to guide or     monitor treatment for     MRSA infections.     Labs: Basic Metabolic Panel:  Recent Labs Lab 07/08/13 1852 07/09/13 0820  NA 138 136  K 3.1* 3.3*  CL 103 98  CO2 25 22  GLUCOSE 85 85  BUN 17 16  CREATININE 1.17* 1.16*  CALCIUM 8.8 9.2   Liver Function Tests: No results found for this basename: AST, ALT, ALKPHOS, BILITOT, PROT, ALBUMIN,  in the last 168 hours No results found for this basename: LIPASE, AMYLASE,  in the last 168 hours No results found for this basename: AMMONIA,  in the last 168 hours CBC:  Recent Labs Lab 07/08/13 1852  WBC 7.6  NEUTROABS 5.1  HGB 13.2  HCT 38.8  MCV 88.6  PLT 368   Cardiac Enzymes:  Recent Labs Lab 07/09/13 0234 07/09/13 0820  TROPONINI <0.30 <0.30   BNP: BNP (last 3 results)  Recent Labs   11/11/12 2125 07/08/13 1852  PROBNP 48.4 116.1   CBG: No results found for this basename: GLUCAP,  in the last 168 hours     Signed:  Calvert Cantor, MD  [facility] 07/10/2013, 3:00 PM

## 2013-07-11 ENCOUNTER — Ambulatory Visit: Payer: Self-pay | Admitting: *Deleted

## 2013-07-18 ENCOUNTER — Other Ambulatory Visit: Payer: Self-pay | Admitting: *Deleted

## 2013-07-18 DIAGNOSIS — C50919 Malignant neoplasm of unspecified site of unspecified female breast: Secondary | ICD-10-CM

## 2013-07-18 DIAGNOSIS — C50912 Malignant neoplasm of unspecified site of left female breast: Secondary | ICD-10-CM

## 2013-07-18 DIAGNOSIS — I10 Essential (primary) hypertension: Secondary | ICD-10-CM

## 2013-07-18 DIAGNOSIS — I2510 Atherosclerotic heart disease of native coronary artery without angina pectoris: Secondary | ICD-10-CM

## 2013-07-18 MED ORDER — METHADONE HCL 5 MG PO TABS: 15.0000 mg | ORAL_TABLET | Freq: Three times a day (TID) | ORAL | Status: DC

## 2013-07-18 NOTE — Telephone Encounter (Signed)
Patient calling for refill, will have available for patient pickup on 7/31, awaiting signature

## 2013-08-08 ENCOUNTER — Ambulatory Visit (INDEPENDENT_AMBULATORY_CARE_PROVIDER_SITE_OTHER): Payer: Self-pay | Admitting: General Surgery

## 2013-08-13 ENCOUNTER — Ambulatory Visit: Payer: Self-pay | Admitting: *Deleted

## 2013-08-15 ENCOUNTER — Telehealth: Payer: Self-pay | Admitting: *Deleted

## 2013-08-15 ENCOUNTER — Other Ambulatory Visit: Payer: Self-pay | Admitting: Oncology

## 2013-08-15 DIAGNOSIS — C50912 Malignant neoplasm of unspecified site of left female breast: Secondary | ICD-10-CM

## 2013-08-15 DIAGNOSIS — I10 Essential (primary) hypertension: Secondary | ICD-10-CM

## 2013-08-15 DIAGNOSIS — I2510 Atherosclerotic heart disease of native coronary artery without angina pectoris: Secondary | ICD-10-CM

## 2013-08-15 DIAGNOSIS — C50919 Malignant neoplasm of unspecified site of unspecified female breast: Secondary | ICD-10-CM

## 2013-08-15 MED ORDER — METHADONE HCL 5 MG PO TABS: 15.0000 mg | ORAL_TABLET | Freq: Three times a day (TID) | ORAL | Status: DC

## 2013-08-15 NOTE — Telephone Encounter (Signed)
Patient calling to request Rx for pickup on 08/17/13. Will leave for Dr Princella Pellegrini approval.

## 2013-09-02 ENCOUNTER — Emergency Department (HOSPITAL_COMMUNITY)
Admission: EM | Admit: 2013-09-02 | Discharge: 2013-09-02 | Disposition: A | Discharge status: Home / Self Care | Payer: Medicaid Other | Attending: Emergency Medicine | Admitting: Emergency Medicine

## 2013-09-02 ENCOUNTER — Emergency Department (HOSPITAL_COMMUNITY): Payer: Medicaid Other

## 2013-09-02 DIAGNOSIS — F17200 Nicotine dependence, unspecified, uncomplicated: Secondary | ICD-10-CM | POA: Insufficient documentation

## 2013-09-02 DIAGNOSIS — Z79899 Other long term (current) drug therapy: Secondary | ICD-10-CM | POA: Insufficient documentation

## 2013-09-02 DIAGNOSIS — G8929 Other chronic pain: Secondary | ICD-10-CM | POA: Insufficient documentation

## 2013-09-02 DIAGNOSIS — R05 Cough: Secondary | ICD-10-CM | POA: Insufficient documentation

## 2013-09-02 DIAGNOSIS — Z7982 Long term (current) use of aspirin: Secondary | ICD-10-CM | POA: Insufficient documentation

## 2013-09-02 DIAGNOSIS — Z8739 Personal history of other diseases of the musculoskeletal system and connective tissue: Secondary | ICD-10-CM | POA: Insufficient documentation

## 2013-09-02 DIAGNOSIS — I10 Essential (primary) hypertension: Secondary | ICD-10-CM | POA: Insufficient documentation

## 2013-09-02 DIAGNOSIS — R059 Cough, unspecified: Secondary | ICD-10-CM | POA: Insufficient documentation

## 2013-09-02 DIAGNOSIS — E785 Hyperlipidemia, unspecified: Secondary | ICD-10-CM | POA: Insufficient documentation

## 2013-09-02 DIAGNOSIS — I2510 Atherosclerotic heart disease of native coronary artery without angina pectoris: Secondary | ICD-10-CM | POA: Insufficient documentation

## 2013-09-02 DIAGNOSIS — J069 Acute upper respiratory infection, unspecified: Secondary | ICD-10-CM | POA: Insufficient documentation

## 2013-09-02 DIAGNOSIS — Z853 Personal history of malignant neoplasm of breast: Secondary | ICD-10-CM | POA: Insufficient documentation

## 2013-09-02 DIAGNOSIS — Z923 Personal history of irradiation: Secondary | ICD-10-CM | POA: Insufficient documentation

## 2013-09-02 MED ORDER — AEROCHAMBER PLUS W/MASK MISC
1.0000 | Freq: Once | Status: AC
  Administered 2013-09-02: 1
  Filled 2013-09-02: qty 1

## 2013-09-02 MED ORDER — OXYMETAZOLINE HCL 0.05 % NA SOLN
1.0000 | Freq: Once | NASAL | Status: AC
  Administered 2013-09-02: 1 via NASAL
  Filled 2013-09-02: qty 15

## 2013-09-02 MED ORDER — ALBUTEROL SULFATE HFA 108 (90 BASE) MCG/ACT IN AERS
2.0000 | INHALATION_SPRAY | RESPIRATORY_TRACT | Status: DC | PRN
  Administered 2013-09-02: 2 via RESPIRATORY_TRACT
  Filled 2013-09-02: qty 6.7

## 2013-09-02 MED ORDER — HYDROCODONE-HOMATROPINE 5-1.5 MG/5ML PO SYRP: 5.0000 mL | ORAL_SOLUTION | Freq: Four times a day (QID) | ORAL | Status: DC | PRN

## 2013-09-02 MED ORDER — AZITHROMYCIN 250 MG PO TABS: 250.0000 mg | ORAL_TABLET | Freq: Every day | ORAL | Status: DC
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
## 2013-09-02 NOTE — ED Notes (Addendum)
Pt states that her fiance is here w/ possible PNA and she has had a sore throat and productive cough with gray, white and green phelm. . States that she is a breast CA survivor and that she wanted to check in to be seen to be sure she didn't have anything. Also states that she is wheezing.

## 2013-09-03 NOTE — ED Provider Notes (Signed)
Medical screening examination/treatment/procedure(s) were performed by non-physician practitioner and as supervising physician I was immediately available for consultation/collaboration.   Gwyneth Sprout, MD 09/03/13 [phone]

## 2013-09-12 ENCOUNTER — Telehealth: Payer: Self-pay | Admitting: *Deleted

## 2013-09-12 NOTE — Telephone Encounter (Signed)
Patient calling in to see if she can take weight loss pills such as Lipozene and Hydroxyzine. According to limited studies on WebMD, these drugs are not FDA approved and therefore not suggested. It has been found to cause anticholinergic effects and can exacerbate existing problems with COPD or CVD, both of which patient has. It was advised that patient continue with her exercise program with the [facility] in addition to diet and increase water intake. Patient verbalized understanding. She will be calling back on Friday/monday for her Methadone refill.

## 2013-09-17 ENCOUNTER — Other Ambulatory Visit: Payer: Self-pay | Admitting: *Deleted

## 2013-09-17 DIAGNOSIS — I2510 Atherosclerotic heart disease of native coronary artery without angina pectoris: Secondary | ICD-10-CM

## 2013-09-17 DIAGNOSIS — C50919 Malignant neoplasm of unspecified site of unspecified female breast: Secondary | ICD-10-CM

## 2013-09-17 DIAGNOSIS — I10 Essential (primary) hypertension: Secondary | ICD-10-CM

## 2013-09-17 DIAGNOSIS — C50912 Malignant neoplasm of unspecified site of left female breast: Secondary | ICD-10-CM

## 2013-09-17 MED ORDER — METHADONE HCL 5 MG PO TABS: 15.0000 mg | ORAL_TABLET | Freq: Three times a day (TID) | ORAL | Status: DC

## 2013-09-20 ENCOUNTER — Ambulatory Visit (INDEPENDENT_AMBULATORY_CARE_PROVIDER_SITE_OTHER): Payer: Self-pay | Admitting: General Surgery

## 2013-09-23 NOTE — Progress Notes (Signed)
This encounter was created in error - please disregard.

## 2013-10-16 ENCOUNTER — Other Ambulatory Visit: Payer: Self-pay | Admitting: *Deleted

## 2013-10-16 DIAGNOSIS — C50912 Malignant neoplasm of unspecified site of left female breast: Secondary | ICD-10-CM

## 2013-10-16 DIAGNOSIS — I2510 Atherosclerotic heart disease of native coronary artery without angina pectoris: Secondary | ICD-10-CM

## 2013-10-16 DIAGNOSIS — I10 Essential (primary) hypertension: Secondary | ICD-10-CM

## 2013-10-16 DIAGNOSIS — C50919 Malignant neoplasm of unspecified site of unspecified female breast: Secondary | ICD-10-CM

## 2013-10-16 MED ORDER — METHADONE HCL 5 MG PO TABS: 15.0000 mg | ORAL_TABLET | Freq: Three times a day (TID) | ORAL | Status: DC

## 2013-11-01 ENCOUNTER — Other Ambulatory Visit: Payer: Medicaid Other | Admitting: Lab

## 2013-11-01 ENCOUNTER — Inpatient Hospital Stay (HOSPITAL_COMMUNITY): Admission: RE | Admit: 2013-11-01 | Payer: Medicaid Other | Source: Ambulatory Visit

## 2013-11-01 NOTE — Telephone Encounter (Signed)
Pt called to cancel her labs for today and to rs for 11/05/13. gv appt for labs on 11/05/13 @ 9:30am...td

## 2013-11-05 ENCOUNTER — Other Ambulatory Visit (HOSPITAL_BASED_OUTPATIENT_CLINIC_OR_DEPARTMENT_OTHER): Payer: Medicaid Other | Admitting: Lab

## 2013-11-05 DIAGNOSIS — C50912 Malignant neoplasm of unspecified site of left female breast: Secondary | ICD-10-CM

## 2013-11-05 DIAGNOSIS — C50919 Malignant neoplasm of unspecified site of unspecified female breast: Secondary | ICD-10-CM

## 2013-11-05 LAB — CBC WITH DIFFERENTIAL/PLATELET
Basophils Absolute: 0.1 10*3/uL (ref 0.0–0.1)
Eosinophils Absolute: 0.2 10*3/uL (ref 0.0–0.5)
HGB: 12.8 g/dL (ref 11.6–15.9)
MCV: 88.4 fL (ref 79.5–101.0)
MONO#: 0.6 10*3/uL (ref 0.1–0.9)
MONO%: 7.6 % (ref 0.0–14.0)
NEUT#: 5.5 10*3/uL (ref 1.5–6.5)
RBC: 4.45 10*6/uL (ref 3.70–5.45)
RDW: 13.8 % (ref 11.2–14.5)
WBC: 8 10*3/uL (ref 3.9–10.3)
lymph#: 1.7 10*3/uL (ref 0.9–3.3)

## 2013-11-05 LAB — COMPREHENSIVE METABOLIC PANEL (CC13)
Albumin: 3.4 g/dL — ABNORMAL LOW (ref 3.5–5.0)
Alkaline Phosphatase: 68 U/L (ref 40–150)
Calcium: 9.4 mg/dL (ref 8.4–10.4)
Chloride: 108 mEq/L (ref 98–109)
Glucose: 131 mg/dl (ref 70–140)
Potassium: 3.1 mEq/L — ABNORMAL LOW (ref 3.5–5.1)
Sodium: 140 mEq/L (ref 136–145)
Total Protein: 6.8 g/dL (ref 6.4–8.3)

## 2013-11-08 ENCOUNTER — Other Ambulatory Visit: Payer: Medicaid Other | Admitting: Lab

## 2013-11-08 ENCOUNTER — Ambulatory Visit (HOSPITAL_BASED_OUTPATIENT_CLINIC_OR_DEPARTMENT_OTHER): Payer: Medicaid Other | Admitting: Physician Assistant

## 2013-11-08 ENCOUNTER — Telehealth: Payer: Self-pay | Admitting: Oncology

## 2013-11-08 ENCOUNTER — Encounter: Payer: Self-pay | Admitting: Physician Assistant

## 2013-11-08 VITALS — BP 109/62 | HR 71 | Temp 97.8°F | Resp 20 | Ht 65.0 in | Wt 247.6 lb

## 2013-11-08 DIAGNOSIS — C50912 Malignant neoplasm of unspecified site of left female breast: Secondary | ICD-10-CM

## 2013-11-08 DIAGNOSIS — G8929 Other chronic pain: Secondary | ICD-10-CM

## 2013-11-08 DIAGNOSIS — C50919 Malignant neoplasm of unspecified site of unspecified female breast: Secondary | ICD-10-CM

## 2013-11-08 DIAGNOSIS — H938X9 Other specified disorders of ear, unspecified ear: Secondary | ICD-10-CM

## 2013-11-08 DIAGNOSIS — R7989 Other specified abnormal findings of blood chemistry: Secondary | ICD-10-CM | POA: Insufficient documentation

## 2013-11-08 DIAGNOSIS — J3489 Other specified disorders of nose and nasal sinuses: Secondary | ICD-10-CM

## 2013-11-08 DIAGNOSIS — E876 Hypokalemia: Secondary | ICD-10-CM

## 2013-11-08 DIAGNOSIS — R0981 Nasal congestion: Secondary | ICD-10-CM

## 2013-11-08 DIAGNOSIS — H938X2 Other specified disorders of left ear: Secondary | ICD-10-CM | POA: Insufficient documentation

## 2013-11-08 DIAGNOSIS — C773 Secondary and unspecified malignant neoplasm of axilla and upper limb lymph nodes: Secondary | ICD-10-CM

## 2013-11-08 DIAGNOSIS — I890 Lymphedema, not elsewhere classified: Secondary | ICD-10-CM

## 2013-11-08 MED ORDER — TAMOXIFEN CITRATE 20 MG PO TABS: 20.0000 mg | ORAL_TABLET | Freq: Every day | ORAL | Status: DC

## 2013-11-08 MED ORDER — FLUTICASONE PROPIONATE 50 MCG/ACT NA SUSP: 2.0000 | Freq: Every day | NASAL | Status: DC
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
## 2013-11-12 ENCOUNTER — Other Ambulatory Visit: Payer: Self-pay | Admitting: *Deleted

## 2013-11-12 DIAGNOSIS — I10 Essential (primary) hypertension: Secondary | ICD-10-CM

## 2013-11-12 DIAGNOSIS — C50912 Malignant neoplasm of unspecified site of left female breast: Secondary | ICD-10-CM

## 2013-11-12 DIAGNOSIS — C50919 Malignant neoplasm of unspecified site of unspecified female breast: Secondary | ICD-10-CM

## 2013-11-12 DIAGNOSIS — I2510 Atherosclerotic heart disease of native coronary artery without angina pectoris: Secondary | ICD-10-CM

## 2013-11-12 MED ORDER — METHADONE HCL 5 MG PO TABS: 15.0000 mg | ORAL_TABLET | Freq: Three times a day (TID) | ORAL | Status: DC

## 2013-11-14 ENCOUNTER — Ambulatory Visit (INDEPENDENT_AMBULATORY_CARE_PROVIDER_SITE_OTHER): Payer: Self-pay | Admitting: General Surgery

## 2013-11-22 ENCOUNTER — Encounter: Payer: Self-pay | Admitting: Oncology

## 2013-11-22 NOTE — Progress Notes (Signed)
Patient left message she needs asst with meds and getting car out of shop. She is not active and has no more funds. I advised her to see if social workers knows of places that could help even though not in treatment.

## 2013-11-29 ENCOUNTER — Encounter: Payer: Self-pay | Admitting: *Deleted

## 2013-11-29 NOTE — Progress Notes (Signed)
This RN received message from pt stating she was changed by per primary MD from Diovan to Lisinopril 40mg .  She is requesting update per our records.  This RN abstracted above information.

## 2013-12-17 ENCOUNTER — Other Ambulatory Visit: Payer: Self-pay | Admitting: Physician Assistant

## 2013-12-17 DIAGNOSIS — C50919 Malignant neoplasm of unspecified site of unspecified female breast: Secondary | ICD-10-CM

## 2013-12-17 DIAGNOSIS — I10 Essential (primary) hypertension: Secondary | ICD-10-CM

## 2013-12-17 DIAGNOSIS — I2510 Atherosclerotic heart disease of native coronary artery without angina pectoris: Secondary | ICD-10-CM

## 2013-12-17 DIAGNOSIS — I890 Lymphedema, not elsewhere classified: Secondary | ICD-10-CM

## 2013-12-17 DIAGNOSIS — C50912 Malignant neoplasm of unspecified site of left female breast: Secondary | ICD-10-CM

## 2013-12-17 DIAGNOSIS — G8929 Other chronic pain: Secondary | ICD-10-CM

## 2013-12-17 MED ORDER — METHADONE HCL 5 MG PO TABS: 15.0000 mg | ORAL_TABLET | Freq: Three times a day (TID) | ORAL | Status: DC

## 2013-12-27 ENCOUNTER — Ambulatory Visit (INDEPENDENT_AMBULATORY_CARE_PROVIDER_SITE_OTHER): Payer: Self-pay | Admitting: General Surgery

## 2014-01-02 ENCOUNTER — Telehealth: Payer: Self-pay | Admitting: Oncology

## 2014-01-02 NOTE — Telephone Encounter (Signed)
, °

## 2014-01-17 ENCOUNTER — Other Ambulatory Visit: Payer: Self-pay | Admitting: *Deleted

## 2014-01-17 DIAGNOSIS — G8929 Other chronic pain: Secondary | ICD-10-CM

## 2014-01-17 MED ORDER — METHADONE HCL 5 MG PO TABS: 15.0000 mg | ORAL_TABLET | Freq: Three times a day (TID) | ORAL | Status: DC

## 2014-01-25 ENCOUNTER — Ambulatory Visit (INDEPENDENT_AMBULATORY_CARE_PROVIDER_SITE_OTHER): Payer: Self-pay | Admitting: General Surgery

## 2014-02-13 ENCOUNTER — Other Ambulatory Visit: Payer: Self-pay | Admitting: *Deleted

## 2014-02-13 DIAGNOSIS — G8929 Other chronic pain: Secondary | ICD-10-CM

## 2014-02-13 MED ORDER — METHADONE HCL 5 MG PO TABS: 15.0000 mg | ORAL_TABLET | Freq: Three times a day (TID) | ORAL | Status: DC

## 2014-03-06 ENCOUNTER — Other Ambulatory Visit (INDEPENDENT_AMBULATORY_CARE_PROVIDER_SITE_OTHER): Payer: Self-pay

## 2014-03-06 MED ORDER — UNABLE TO FIND: Status: DC

## 2014-03-14 ENCOUNTER — Other Ambulatory Visit: Payer: Self-pay | Admitting: *Deleted

## 2014-03-14 DIAGNOSIS — G8929 Other chronic pain: Secondary | ICD-10-CM

## 2014-03-14 MED ORDER — METHADONE HCL 5 MG PO TABS: 15.0000 mg | ORAL_TABLET | Freq: Three times a day (TID) | ORAL | Status: DC

## 2014-03-22 ENCOUNTER — Telehealth: Payer: Self-pay | Admitting: *Deleted

## 2014-03-22 ENCOUNTER — Other Ambulatory Visit: Payer: Self-pay | Admitting: Oncology

## 2014-03-22 DIAGNOSIS — C50919 Malignant neoplasm of unspecified site of unspecified female breast: Secondary | ICD-10-CM

## 2014-03-22 NOTE — Telephone Encounter (Signed)
Patient calling in to report she has been having severe headaches and not "feeling right" She states the left side of her head has been hurting, mild vision changes, some memory issues and dizziness. She would like to know if Dr [name] thinks she needs to have some type of CT or MRI for further evaluation. After discussion, MRI with pre-labs have been ordered.

## 2014-03-25 ENCOUNTER — Telehealth: Payer: Self-pay | Admitting: Oncology

## 2014-03-25 NOTE — Telephone Encounter (Signed)
, °

## 2014-03-27 ENCOUNTER — Other Ambulatory Visit: Payer: Self-pay | Admitting: Oncology

## 2014-03-27 DIAGNOSIS — C50919 Malignant neoplasm of unspecified site of unspecified female breast: Secondary | ICD-10-CM

## 2014-03-28 ENCOUNTER — Telehealth: Payer: Self-pay | Admitting: Oncology

## 2014-03-28 ENCOUNTER — Other Ambulatory Visit: Payer: Self-pay | Admitting: *Deleted

## 2014-03-28 DIAGNOSIS — C50919 Malignant neoplasm of unspecified site of unspecified female breast: Secondary | ICD-10-CM

## 2014-03-28 DIAGNOSIS — I2510 Atherosclerotic heart disease of native coronary artery without angina pectoris: Secondary | ICD-10-CM

## 2014-03-28 DIAGNOSIS — I10 Essential (primary) hypertension: Secondary | ICD-10-CM

## 2014-03-28 NOTE — Telephone Encounter (Signed)
, °

## 2014-03-29 ENCOUNTER — Ambulatory Visit (HOSPITAL_COMMUNITY): Payer: Medicaid Other

## 2014-03-29 ENCOUNTER — Other Ambulatory Visit (HOSPITAL_BASED_OUTPATIENT_CLINIC_OR_DEPARTMENT_OTHER): Payer: Medicaid Other

## 2014-03-29 ENCOUNTER — Other Ambulatory Visit: Payer: Medicaid Other

## 2014-03-29 ENCOUNTER — Ambulatory Visit (HOSPITAL_COMMUNITY)
Admission: RE | Admit: 2014-03-29 | Discharge: 2014-03-29 | Disposition: A | Discharge status: Home / Self Care | Payer: Medicaid Other | Source: Ambulatory Visit | Attending: Oncology | Admitting: Oncology

## 2014-03-29 ENCOUNTER — Telehealth: Payer: Self-pay

## 2014-03-29 DIAGNOSIS — C50919 Malignant neoplasm of unspecified site of unspecified female breast: Secondary | ICD-10-CM

## 2014-03-29 LAB — COMPREHENSIVE METABOLIC PANEL (CC13)
ALBUMIN: 3.6 g/dL (ref 3.5–5.0)
ALT: 13 U/L (ref 0–55)
ANION GAP: 12 meq/L — AB (ref 3–11)
AST: 19 U/L (ref 5–34)
Alkaline Phosphatase: 71 U/L (ref 40–150)
BUN: 14.8 mg/dL (ref 7.0–26.0)
CALCIUM: 9.6 mg/dL (ref 8.4–10.4)
CHLORIDE: 107 meq/L (ref 98–109)
CO2: 22 meq/L (ref 22–29)
CREATININE: 1.1 mg/dL (ref 0.6–1.1)
Glucose: 105 mg/dl (ref 70–140)
POTASSIUM: 3.6 meq/L (ref 3.5–5.1)
Sodium: 141 mEq/L (ref 136–145)
Total Bilirubin: 0.24 mg/dL (ref 0.20–1.20)
Total Protein: 7 g/dL (ref 6.4–8.3)

## 2014-03-29 NOTE — Telephone Encounter (Signed)
Patient called  to clarify when her lab and ct scan appointments are scheduled. Told her that the lab appointment is for 4 pm today.  Told her that a Ct scan of her head was ordered to evaluate her headaches yesterday by Dr. [name].  Central scheduling will call her with the appointment time and date.  The scans need to be pre authorized  Before scheduling.  She should hear from central scheduling if not today early next week.  Patient verbalized understanding.  She will be here for lab at 4 pm today.

## 2014-03-31 ENCOUNTER — Other Ambulatory Visit: Payer: Self-pay | Admitting: Oncology

## 2014-04-01 ENCOUNTER — Other Ambulatory Visit: Payer: Self-pay | Admitting: *Deleted

## 2014-04-01 DIAGNOSIS — I890 Lymphedema, not elsewhere classified: Secondary | ICD-10-CM

## 2014-04-01 DIAGNOSIS — C50919 Malignant neoplasm of unspecified site of unspecified female breast: Secondary | ICD-10-CM

## 2014-04-01 DIAGNOSIS — R5381 Other malaise: Secondary | ICD-10-CM

## 2014-04-01 NOTE — Progress Notes (Signed)
This RN spoke with per her call for review of labs.  [name] also states she is having continued concerns for lymphedema in her arm- with noted gauntlet very loose.  This RN placed referral for re-consult at the lymphedema clinic per pt's concerns.

## 2014-04-14 ENCOUNTER — Other Ambulatory Visit: Payer: Self-pay | Admitting: *Deleted

## 2014-04-14 DIAGNOSIS — G8929 Other chronic pain: Secondary | ICD-10-CM

## 2014-04-14 MED ORDER — METHADONE HCL 5 MG PO TABS: 15.0000 mg | ORAL_TABLET | Freq: Three times a day (TID) | ORAL | Status: DC

## 2014-04-14 NOTE — Telephone Encounter (Signed)
Rx left for MD signature for pickup on Monday 4/27

## 2014-04-16 ENCOUNTER — Ambulatory Visit: Payer: Medicaid Other | Admitting: Physical Therapy

## 2014-04-19 ENCOUNTER — Telehealth: Payer: Self-pay | Admitting: *Deleted

## 2014-04-19 NOTE — Telephone Encounter (Signed)
Pt called to this RN to state onset of " vaginal spotting ".  Per discussion Rhetta verified uterus and ovaries are intact. Last menses was " when I was getting chemo and before I started the tamoxifen"  Per further inquiry [name] states she has not had a GYN exam " in years ".  This RN discussed with [name] above may be her body's natural resuming of menses, but since she is also on tamoxifen and has not had a recent GYN exam, she needs to be seen ASAP.  [name] states she does not have a GYN.  This RN contacted [facility] office and was informed they do accept medicaid for " non family planning " patients. Pt would need to obtain referral through her PCP per medicaid card and call for appt once obtained.  Above called to patient and discussed with [name] verbalizing understanding.

## 2014-04-29 ENCOUNTER — Ambulatory Visit: Payer: Medicaid Other | Attending: Oncology | Admitting: Physical Therapy

## 2014-04-29 DIAGNOSIS — I890 Lymphedema, not elsewhere classified: Secondary | ICD-10-CM | POA: Insufficient documentation

## 2014-04-29 DIAGNOSIS — R5381 Other malaise: Secondary | ICD-10-CM | POA: Diagnosis not present

## 2014-04-29 DIAGNOSIS — C50919 Malignant neoplasm of unspecified site of unspecified female breast: Secondary | ICD-10-CM | POA: Diagnosis not present

## 2014-04-29 DIAGNOSIS — IMO0001 Reserved for inherently not codable concepts without codable children: Secondary | ICD-10-CM | POA: Insufficient documentation

## 2014-05-02 ENCOUNTER — Other Ambulatory Visit: Payer: Self-pay | Admitting: Physician Assistant

## 2014-05-02 ENCOUNTER — Telehealth: Payer: Self-pay | Admitting: *Deleted

## 2014-05-02 ENCOUNTER — Other Ambulatory Visit (HOSPITAL_BASED_OUTPATIENT_CLINIC_OR_DEPARTMENT_OTHER): Payer: Medicaid Other

## 2014-05-02 DIAGNOSIS — C50919 Malignant neoplasm of unspecified site of unspecified female breast: Secondary | ICD-10-CM

## 2014-05-02 DIAGNOSIS — C50912 Malignant neoplasm of unspecified site of left female breast: Secondary | ICD-10-CM

## 2014-05-02 DIAGNOSIS — R7989 Other specified abnormal findings of blood chemistry: Secondary | ICD-10-CM

## 2014-05-02 DIAGNOSIS — E876 Hypokalemia: Secondary | ICD-10-CM

## 2014-05-02 LAB — CBC WITH DIFFERENTIAL/PLATELET
BASO%: 1 % (ref 0.0–2.0)
Basophils Absolute: 0.1 10*3/uL (ref 0.0–0.1)
EOS%: 1.5 % (ref 0.0–7.0)
Eosinophils Absolute: 0.1 10*3/uL (ref 0.0–0.5)
HCT: 40.6 % (ref 34.8–46.6)
HEMOGLOBIN: 13.1 g/dL (ref 11.6–15.9)
LYMPH#: 1.8 10*3/uL (ref 0.9–3.3)
LYMPH%: 21.8 % (ref 14.0–49.7)
MCH: 29.1 pg (ref 25.1–34.0)
MCHC: 32.4 g/dL (ref 31.5–36.0)
MCV: 89.8 fL (ref 79.5–101.0)
MONO#: 0.5 10*3/uL (ref 0.1–0.9)
MONO%: 5.8 % (ref 0.0–14.0)
NEUT#: 5.8 10*3/uL (ref 1.5–6.5)
NEUT%: 69.9 % (ref 38.4–76.8)
Platelets: 440 10*3/uL — ABNORMAL HIGH (ref 145–400)
RBC: 4.52 10*6/uL (ref 3.70–5.45)
RDW: 14.7 % — AB (ref 11.2–14.5)
WBC: 8.3 10*3/uL (ref 3.9–10.3)

## 2014-05-02 LAB — COMPREHENSIVE METABOLIC PANEL (CC13)
ALBUMIN: 3.5 g/dL (ref 3.5–5.0)
ALK PHOS: 63 U/L (ref 40–150)
ALT: 17 U/L (ref 0–55)
AST: 19 U/L (ref 5–34)
Anion Gap: 14 mEq/L — ABNORMAL HIGH (ref 3–11)
BUN: 14.1 mg/dL (ref 7.0–26.0)
CALCIUM: 9.3 mg/dL (ref 8.4–10.4)
CHLORIDE: 110 meq/L — AB (ref 98–109)
CO2: 20 mEq/L — ABNORMAL LOW (ref 22–29)
Creatinine: 1.2 mg/dL — ABNORMAL HIGH (ref 0.6–1.1)
Glucose: 123 mg/dl (ref 70–140)
POTASSIUM: 3.2 meq/L — AB (ref 3.5–5.1)
SODIUM: 144 meq/L (ref 136–145)
Total Bilirubin: 0.33 mg/dL (ref 0.20–1.20)
Total Protein: 6.8 g/dL (ref 6.4–8.3)

## 2014-05-02 NOTE — Telephone Encounter (Signed)
Called pt to inform her of Potassium level(3.2). Explained to pt that her potassium level was slightly low. Asked pt if she was taking her Potassium as prescribed which is twice a day. Pt said she has only been taking 1 tablet a day. I advised pt to start taking as Rx , Potassium 20 mEq - 1 tab twice a day. Pt agreed. I told her labs have been added on 5/21@ 11:30 before she sees Dr. [name]. Pt verbalized understanding. Message to be forwarded to [employer], PA-C.

## 2014-05-04 ENCOUNTER — Telehealth: Payer: Self-pay | Admitting: Oncology

## 2014-05-04 NOTE — Telephone Encounter (Signed)
s.w pt and advised on May appt.....pt ok and aware °

## 2014-05-06 ENCOUNTER — Encounter: Payer: Self-pay | Admitting: Oncology

## 2014-05-06 NOTE — Progress Notes (Signed)
Patient called and left a message. She said something about medicare?????  I called and no answer at [phone]

## 2014-05-07 ENCOUNTER — Ambulatory Visit: Payer: Medicaid Other | Admitting: Physical Therapy

## 2014-05-07 DIAGNOSIS — IMO0001 Reserved for inherently not codable concepts without codable children: Secondary | ICD-10-CM | POA: Diagnosis not present

## 2014-05-09 ENCOUNTER — Encounter: Payer: Self-pay | Admitting: Physician Assistant

## 2014-05-09 ENCOUNTER — Ambulatory Visit (HOSPITAL_BASED_OUTPATIENT_CLINIC_OR_DEPARTMENT_OTHER): Payer: Medicaid Other | Admitting: Physician Assistant

## 2014-05-09 ENCOUNTER — Telehealth: Payer: Self-pay | Admitting: Physician Assistant

## 2014-05-09 ENCOUNTER — Ambulatory Visit: Payer: Medicaid Other | Admitting: Oncology

## 2014-05-09 ENCOUNTER — Other Ambulatory Visit (HOSPITAL_BASED_OUTPATIENT_CLINIC_OR_DEPARTMENT_OTHER): Payer: Medicaid Other

## 2014-05-09 VITALS — BP 102/70 | HR 76 | Temp 98.0°F | Resp 18 | Ht 65.0 in | Wt 238.7 lb

## 2014-05-09 DIAGNOSIS — C773 Secondary and unspecified malignant neoplasm of axilla and upper limb lymph nodes: Secondary | ICD-10-CM

## 2014-05-09 DIAGNOSIS — C50919 Malignant neoplasm of unspecified site of unspecified female breast: Secondary | ICD-10-CM

## 2014-05-09 DIAGNOSIS — R7989 Other specified abnormal findings of blood chemistry: Secondary | ICD-10-CM

## 2014-05-09 DIAGNOSIS — Z853 Personal history of malignant neoplasm of breast: Secondary | ICD-10-CM

## 2014-05-09 DIAGNOSIS — T451X5A Adverse effect of antineoplastic and immunosuppressive drugs, initial encounter: Secondary | ICD-10-CM

## 2014-05-09 DIAGNOSIS — I890 Lymphedema, not elsewhere classified: Secondary | ICD-10-CM

## 2014-05-09 DIAGNOSIS — E876 Hypokalemia: Secondary | ICD-10-CM

## 2014-05-09 DIAGNOSIS — M792 Neuralgia and neuritis, unspecified: Secondary | ICD-10-CM | POA: Insufficient documentation

## 2014-05-09 DIAGNOSIS — R232 Flushing: Secondary | ICD-10-CM | POA: Insufficient documentation

## 2014-05-09 DIAGNOSIS — G8929 Other chronic pain: Secondary | ICD-10-CM

## 2014-05-09 LAB — BASIC METABOLIC PANEL (CC13)
ANION GAP: 11 meq/L (ref 3–11)
BUN: 11.5 mg/dL (ref 7.0–26.0)
CO2: 21 mEq/L — ABNORMAL LOW (ref 22–29)
CREATININE: 1.2 mg/dL — AB (ref 0.6–1.1)
Calcium: 9.3 mg/dL (ref 8.4–10.4)
Chloride: 111 mEq/L — ABNORMAL HIGH (ref 98–109)
GLUCOSE: 102 mg/dL (ref 70–140)
POTASSIUM: 3.5 meq/L (ref 3.5–5.1)
Sodium: 143 mEq/L (ref 136–145)

## 2014-05-09 MED ORDER — GABAPENTIN 100 MG PO CAPS: 100.0000 mg | ORAL_CAPSULE | Freq: Every evening | ORAL | Status: DC | PRN

## 2014-05-09 NOTE — Telephone Encounter (Signed)
per pof to sch pt appt-cld CSS to sch stared will put on recall for Aug-gave pt copy of sch-pt understood
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
## 2014-05-14 ENCOUNTER — Other Ambulatory Visit: Payer: Self-pay | Admitting: Physician Assistant

## 2014-05-14 DIAGNOSIS — G8929 Other chronic pain: Secondary | ICD-10-CM

## 2014-05-14 DIAGNOSIS — Z853 Personal history of malignant neoplasm of breast: Secondary | ICD-10-CM

## 2014-05-14 DIAGNOSIS — M792 Neuralgia and neuritis, unspecified: Secondary | ICD-10-CM

## 2014-05-14 MED ORDER — METHADONE HCL 5 MG PO TABS: 15.0000 mg | ORAL_TABLET | Freq: Three times a day (TID) | ORAL | Status: DC

## 2014-05-16 ENCOUNTER — Ambulatory Visit: Payer: Medicaid Other | Admitting: Physical Therapy

## 2014-05-16 DIAGNOSIS — IMO0001 Reserved for inherently not codable concepts without codable children: Secondary | ICD-10-CM | POA: Diagnosis not present

## 2014-05-22 ENCOUNTER — Ambulatory Visit: Payer: Medicaid Other | Attending: Oncology

## 2014-05-22 DIAGNOSIS — C50919 Malignant neoplasm of unspecified site of unspecified female breast: Secondary | ICD-10-CM | POA: Insufficient documentation

## 2014-05-22 DIAGNOSIS — R5381 Other malaise: Secondary | ICD-10-CM | POA: Insufficient documentation

## 2014-05-22 DIAGNOSIS — I890 Lymphedema, not elsewhere classified: Secondary | ICD-10-CM | POA: Diagnosis not present

## 2014-05-22 DIAGNOSIS — IMO0001 Reserved for inherently not codable concepts without codable children: Secondary | ICD-10-CM | POA: Diagnosis present

## 2014-05-29 ENCOUNTER — Ambulatory Visit: Payer: Medicaid Other

## 2014-06-06 ENCOUNTER — Encounter: Payer: Medicaid Other | Admitting: Physical Therapy

## 2014-06-07 ENCOUNTER — Telehealth: Payer: Self-pay | Admitting: Oncology

## 2014-06-07 NOTE — Telephone Encounter (Signed)
Faxed pt medical records to  [facility]

## 2014-06-12 ENCOUNTER — Other Ambulatory Visit: Payer: Self-pay | Admitting: *Deleted

## 2014-06-12 DIAGNOSIS — Z853 Personal history of malignant neoplasm of breast: Secondary | ICD-10-CM

## 2014-06-12 DIAGNOSIS — G8929 Other chronic pain: Secondary | ICD-10-CM

## 2014-06-12 DIAGNOSIS — M792 Neuralgia and neuritis, unspecified: Secondary | ICD-10-CM

## 2014-06-12 MED ORDER — METHADONE HCL 5 MG PO TABS
15.0000 mg | ORAL_TABLET | Freq: Three times a day (TID) | ORAL | Status: DC
Start: 2014-06-12 — End: 2014-07-12

## 2014-06-17 ENCOUNTER — Encounter: Payer: Medicaid Other | Admitting: Physical Therapy

## 2014-06-24 ENCOUNTER — Telehealth: Payer: Self-pay

## 2014-06-24 NOTE — Telephone Encounter (Signed)
Faxed medicaid approval for compression garments to [facility].

## 2014-07-04 ENCOUNTER — Other Ambulatory Visit: Payer: Self-pay | Admitting: Physician Assistant

## 2014-07-04 DIAGNOSIS — C50919 Malignant neoplasm of unspecified site of unspecified female breast: Secondary | ICD-10-CM

## 2014-07-12 ENCOUNTER — Other Ambulatory Visit: Payer: Self-pay | Admitting: *Deleted

## 2014-07-12 DIAGNOSIS — Z853 Personal history of malignant neoplasm of breast: Secondary | ICD-10-CM

## 2014-07-12 DIAGNOSIS — M792 Neuralgia and neuritis, unspecified: Secondary | ICD-10-CM

## 2014-07-12 DIAGNOSIS — G8929 Other chronic pain: Secondary | ICD-10-CM

## 2014-07-12 MED ORDER — METHADONE HCL 5 MG PO TABS: 15.0000 mg | ORAL_TABLET | Freq: Three times a day (TID) | ORAL | Status: DC

## 2014-08-01 ENCOUNTER — Ambulatory Visit (INDEPENDENT_AMBULATORY_CARE_PROVIDER_SITE_OTHER): Payer: Medicaid Other | Admitting: General Surgery

## 2014-08-01 VITALS — BP 132/78 | HR 77 | Temp 98.1°F | Ht 65.0 in | Wt 244.0 lb

## 2014-08-01 DIAGNOSIS — C50919 Malignant neoplasm of unspecified site of unspecified female breast: Secondary | ICD-10-CM

## 2014-08-01 DIAGNOSIS — R635 Abnormal weight gain: Secondary | ICD-10-CM

## 2014-08-01 NOTE — Progress Notes (Signed)
Chief complaint: Followup breast cancer  History: Patient returns for long-term followup status post bilateral mastectomy with axillary lymph node dissection on the left and sentinel node biopsy on the right for stage IIB invasive ductal carcinoma. She had 2 less than i centimeter invasive cancers on the right. She underwent postoperative chemotherapy and radiation on the left and now on adjuvant tamoxifen.  Tolerating her tamoxifen okay.   She has moderate lymphedema which is followed in the lymphedema clinic. She uses a sleeve and a pump daily and says this is stable.She states she has some occasional soreness around her mastectomy sites and discomfort in her arm but overall is doing pretty well.  She has been on quite a bit of weight over the last year and is concerned about this.   Exam: BP 132/78  Pulse 77  Temp(Src) 98.1 F (36.7 C) (Oral)  Ht 5\' 5"  (1.651 m)  Wt 244 lb (110.678 kg)  BMI 40.60 kg/m2  LMP 07/16/2011 General: Moderately obese African American female no distress Lymph nodes: No cervical, supraclavicular or axillary nodes palpable Lungs: Clear equal breath sounds bilaterally Chest wall: Well-healed mastectomy sites bilaterally. Moderate post radiation changes on the left. Some thickening under the left mastectomy scar but no dominant masses. No worrisome skin changes. Extremities: Moderate lymphedema on the left the sleeve in place  Assessment and plan: Doing well with no clinical evidence of recurrent disease. Stable lymphedema. She has been on quite a bit of weight which is contributing to her knee pain. I will refer her to the diabetes and nutrition center for a weight loss plan. I asked her to call as needed and will otherwise see her in one year.

## 2014-08-05 ENCOUNTER — Telehealth: Payer: Self-pay | Admitting: *Deleted

## 2014-08-05 NOTE — Telephone Encounter (Signed)
This RN received VM from pt stating " I need to speak with you - my bones and arm feel like a weight in them "  Call returned to pt at [phone]- no answer.  Pt left a second message stating " my call got disconnected "  This RN returned call and obtained identified VM- message left to return call to this RN to discuss her symptoms further and if needed to request to speak with a triage nurse.

## 2014-08-06 ENCOUNTER — Telehealth: Payer: Self-pay | Admitting: *Deleted

## 2014-08-06 NOTE — Telephone Encounter (Signed)
This RN was able to speak with pt today per her multiple calls with concerns - [name] states she is " just worried - because I am having ongoing symptoms like I keep coughing,and my bones ache and my skin is itching especially my arms "  " I just feel if I could be seen I would know not to worry "  This RN scheduled pt appropriately with CP2.

## 2014-08-07 ENCOUNTER — Ambulatory Visit: Payer: Medicaid Other

## 2014-08-13 ENCOUNTER — Other Ambulatory Visit: Payer: Self-pay | Admitting: *Deleted

## 2014-08-13 ENCOUNTER — Ambulatory Visit (HOSPITAL_BASED_OUTPATIENT_CLINIC_OR_DEPARTMENT_OTHER): Payer: Medicaid Other

## 2014-08-13 ENCOUNTER — Telehealth: Payer: Self-pay | Admitting: *Deleted

## 2014-08-13 ENCOUNTER — Ambulatory Visit (HOSPITAL_BASED_OUTPATIENT_CLINIC_OR_DEPARTMENT_OTHER): Payer: Medicaid Other | Admitting: Hematology

## 2014-08-13 VITALS — BP 121/75 | HR 70 | Temp 98.3°F | Resp 18 | Ht 65.0 in | Wt 243.5 lb

## 2014-08-13 DIAGNOSIS — E876 Hypokalemia: Secondary | ICD-10-CM

## 2014-08-13 DIAGNOSIS — Z853 Personal history of malignant neoplasm of breast: Secondary | ICD-10-CM

## 2014-08-13 DIAGNOSIS — R52 Pain, unspecified: Secondary | ICD-10-CM

## 2014-08-13 DIAGNOSIS — I890 Lymphedema, not elsewhere classified: Secondary | ICD-10-CM

## 2014-08-13 DIAGNOSIS — M792 Neuralgia and neuritis, unspecified: Secondary | ICD-10-CM

## 2014-08-13 DIAGNOSIS — C50919 Malignant neoplasm of unspecified site of unspecified female breast: Secondary | ICD-10-CM

## 2014-08-13 DIAGNOSIS — L299 Pruritus, unspecified: Secondary | ICD-10-CM

## 2014-08-13 DIAGNOSIS — G8929 Other chronic pain: Secondary | ICD-10-CM

## 2014-08-13 DIAGNOSIS — C773 Secondary and unspecified malignant neoplasm of axilla and upper limb lymph nodes: Secondary | ICD-10-CM

## 2014-08-13 LAB — CBC WITH DIFFERENTIAL/PLATELET
BASO%: 1 % (ref 0.0–2.0)
Basophils Absolute: 0.1 10*3/uL (ref 0.0–0.1)
EOS ABS: 0.2 10*3/uL (ref 0.0–0.5)
EOS%: 1.6 % (ref 0.0–7.0)
HCT: 43.7 % (ref 34.8–46.6)
HGB: 14.2 g/dL (ref 11.6–15.9)
LYMPH%: 18.9 % (ref 14.0–49.7)
MCH: 28.8 pg (ref 25.1–34.0)
MCHC: 32.5 g/dL (ref 31.5–36.0)
MCV: 88.4 fL (ref 79.5–101.0)
MONO#: 0.6 10*3/uL (ref 0.1–0.9)
MONO%: 5.5 % (ref 0.0–14.0)
NEUT%: 73 % (ref 38.4–76.8)
NEUTROS ABS: 7.7 10*3/uL — AB (ref 1.5–6.5)
PLATELETS: 499 10*3/uL — AB (ref 145–400)
RBC: 4.95 10*6/uL (ref 3.70–5.45)
RDW: 14.7 % — AB (ref 11.2–14.5)
WBC: 10.5 10*3/uL — ABNORMAL HIGH (ref 3.9–10.3)
lymph#: 2 10*3/uL (ref 0.9–3.3)

## 2014-08-13 LAB — COMPREHENSIVE METABOLIC PANEL (CC13)
ALK PHOS: 81 U/L (ref 40–150)
ALT: 21 U/L (ref 0–55)
AST: 22 U/L (ref 5–34)
Albumin: 3.7 g/dL (ref 3.5–5.0)
Anion Gap: 11 mEq/L (ref 3–11)
BILIRUBIN TOTAL: 0.29 mg/dL (ref 0.20–1.20)
BUN: 15.6 mg/dL (ref 7.0–26.0)
CO2: 25 mEq/L (ref 22–29)
Calcium: 9.5 mg/dL (ref 8.4–10.4)
Chloride: 103 mEq/L (ref 98–109)
Creatinine: 1.1 mg/dL (ref 0.6–1.1)
Glucose: 120 mg/dl (ref 70–140)
POTASSIUM: 2.8 meq/L — AB (ref 3.5–5.1)
SODIUM: 139 meq/L (ref 136–145)
Total Protein: 7.2 g/dL (ref 6.4–8.3)

## 2014-08-13 MED ORDER — METHADONE HCL 5 MG PO TABS: 15.0000 mg | ORAL_TABLET | Freq: Three times a day (TID) | ORAL | Status: DC

## 2014-08-13 MED ORDER — POTASSIUM CHLORIDE CRYS ER 20 MEQ PO TBCR: 40.0000 meq | EXTENDED_RELEASE_TABLET | Freq: Every day | ORAL | Status: DC

## 2014-08-13 NOTE — Telephone Encounter (Signed)
Pt returned my call and I confirmed her to see Dr. Lona Kettle at 1:30.  Changed appt in schedule.  Made Erline Levine and NT aware.

## 2014-08-14 LAB — CANCER ANTIGEN 15-3: Cancer Antigen-Breast 15-3: 5 U/mL (ref <32)

## 2014-08-14 LAB — CANCER ANTIGEN 27.29: CA 27.29: 9 U/mL (ref 0–39)

## 2014-08-15 ENCOUNTER — Encounter: Payer: Self-pay | Admitting: Hematology
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
## 2014-08-18 ENCOUNTER — Encounter: Payer: Self-pay | Admitting: Hematology

## 2014-08-18 NOTE — Patient Instructions (Signed)
4 pages of written notes given to patient.

## 2014-08-21 ENCOUNTER — Telehealth: Payer: Self-pay | Admitting: Emergency Medicine

## 2014-08-21 ENCOUNTER — Other Ambulatory Visit: Payer: Self-pay | Admitting: Oncology

## 2014-08-21 DIAGNOSIS — C50919 Malignant neoplasm of unspecified site of unspecified female breast: Secondary | ICD-10-CM

## 2014-08-21 NOTE — Telephone Encounter (Signed)
Patient called stating that she has been having intermittent headaches that are relieved with ibuprofen. Denies dizziness. Denies any other complaints other than the headaches. Patient does state that she has "hit her head" several times recently. Denies any LOC after events. Patient scheduled to see Dr [name] on 11/16 for follow visit.

## 2014-09-03 ENCOUNTER — Ambulatory Visit (HOSPITAL_COMMUNITY)
Admission: RE | Admit: 2014-09-03 | Discharge: 2014-09-03 | Disposition: A | Discharge status: Home / Self Care | Payer: Medicaid Other | Source: Ambulatory Visit | Attending: Oncology | Admitting: Oncology

## 2014-09-03 DIAGNOSIS — H538 Other visual disturbances: Secondary | ICD-10-CM | POA: Diagnosis not present

## 2014-09-03 DIAGNOSIS — R51 Headache: Secondary | ICD-10-CM | POA: Insufficient documentation

## 2014-09-03 DIAGNOSIS — C50919 Malignant neoplasm of unspecified site of unspecified female breast: Secondary | ICD-10-CM | POA: Insufficient documentation

## 2014-09-03 DIAGNOSIS — R413 Other amnesia: Secondary | ICD-10-CM | POA: Diagnosis not present

## 2014-09-03 MED ORDER — GADOBENATE DIMEGLUMINE 529 MG/ML IV SOLN
20.0000 mL | Freq: Once | INTRAVENOUS | Status: AC | PRN
  Administered 2014-09-03: 20 mL via INTRAVENOUS

## 2014-09-13 ENCOUNTER — Other Ambulatory Visit: Payer: Self-pay | Admitting: *Deleted

## 2014-09-13 ENCOUNTER — Encounter: Payer: Self-pay | Admitting: Oncology

## 2014-09-13 DIAGNOSIS — Z853 Personal history of malignant neoplasm of breast: Secondary | ICD-10-CM

## 2014-09-13 DIAGNOSIS — G8929 Other chronic pain: Secondary | ICD-10-CM

## 2014-09-13 DIAGNOSIS — M792 Neuralgia and neuritis, unspecified: Secondary | ICD-10-CM

## 2014-09-13 MED ORDER — METHADONE HCL 5 MG PO TABS: 15.0000 mg | ORAL_TABLET | Freq: Three times a day (TID) | ORAL | Status: DC

## 2014-09-13 NOTE — Progress Notes (Signed)
Faxed methadone pa form to [facility]

## 2014-09-16 ENCOUNTER — Ambulatory Visit: Payer: Medicaid Other | Admitting: Dietician

## 2014-09-16 ENCOUNTER — Ambulatory Visit (HOSPITAL_BASED_OUTPATIENT_CLINIC_OR_DEPARTMENT_OTHER): Payer: Medicaid Other | Admitting: Oncology

## 2014-09-16 ENCOUNTER — Telehealth: Payer: Self-pay | Admitting: Oncology

## 2014-09-16 VITALS — BP 106/56 | HR 76 | Temp 98.5°F | Resp 18 | Ht 65.0 in | Wt 247.0 lb

## 2014-09-16 DIAGNOSIS — C50919 Malignant neoplasm of unspecified site of unspecified female breast: Secondary | ICD-10-CM

## 2014-09-16 DIAGNOSIS — C50811 Malignant neoplasm of overlapping sites of right female breast: Secondary | ICD-10-CM | POA: Insufficient documentation

## 2014-09-16 DIAGNOSIS — G894 Chronic pain syndrome: Secondary | ICD-10-CM

## 2014-09-16 DIAGNOSIS — C773 Secondary and unspecified malignant neoplasm of axilla and upper limb lymph nodes: Secondary | ICD-10-CM

## 2014-09-16 DIAGNOSIS — C50912 Malignant neoplasm of unspecified site of left female breast: Secondary | ICD-10-CM

## 2014-09-16 DIAGNOSIS — Z170 Estrogen receptor positive status [ER+]: Secondary | ICD-10-CM

## 2014-09-16 DIAGNOSIS — C50911 Malignant neoplasm of unspecified site of right female breast: Secondary | ICD-10-CM

## 2014-09-16 DIAGNOSIS — C50412 Malignant neoplasm of upper-outer quadrant of left female breast: Secondary | ICD-10-CM | POA: Insufficient documentation
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
## 2014-09-16 NOTE — Telephone Encounter (Signed)
, °

## 2014-09-30 ENCOUNTER — Other Ambulatory Visit: Payer: Self-pay | Admitting: *Deleted

## 2014-10-01 ENCOUNTER — Other Ambulatory Visit: Payer: Self-pay | Admitting: *Deleted

## 2014-10-01 DIAGNOSIS — C50919 Malignant neoplasm of unspecified site of unspecified female breast: Secondary | ICD-10-CM

## 2014-10-01 DIAGNOSIS — E876 Hypokalemia: Secondary | ICD-10-CM

## 2014-10-01 MED ORDER — POTASSIUM CHLORIDE CRYS ER 20 MEQ PO TBCR: 40.0000 meq | EXTENDED_RELEASE_TABLET | Freq: Every day | ORAL | Status: DC

## 2014-10-03 ENCOUNTER — Encounter: Payer: Self-pay | Admitting: *Deleted

## 2014-10-07 ENCOUNTER — Other Ambulatory Visit: Payer: Self-pay | Admitting: *Deleted

## 2014-10-07 DIAGNOSIS — Z853 Personal history of malignant neoplasm of breast: Secondary | ICD-10-CM

## 2014-10-07 DIAGNOSIS — M792 Neuralgia and neuritis, unspecified: Secondary | ICD-10-CM

## 2014-10-07 DIAGNOSIS — G8929 Other chronic pain: Secondary | ICD-10-CM

## 2014-10-07 MED ORDER — METHADONE HCL 5 MG PO TABS: 15.0000 mg | ORAL_TABLET | Freq: Three times a day (TID) | ORAL | Status: DC

## 2014-10-08 ENCOUNTER — Other Ambulatory Visit: Payer: Self-pay | Admitting: *Deleted

## 2014-10-16 ENCOUNTER — Other Ambulatory Visit: Payer: Self-pay | Admitting: Nurse Practitioner

## 2014-10-25 ENCOUNTER — Other Ambulatory Visit: Payer: Self-pay | Admitting: Family Medicine

## 2014-10-25 DIAGNOSIS — N63 Unspecified lump in unspecified breast: Secondary | ICD-10-CM

## 2014-10-28 ENCOUNTER — Other Ambulatory Visit: Payer: Self-pay | Admitting: Emergency Medicine

## 2014-10-28 ENCOUNTER — Other Ambulatory Visit: Payer: Medicaid Other

## 2014-10-28 ENCOUNTER — Telehealth: Payer: Self-pay | Admitting: Oncology

## 2014-10-28 NOTE — Telephone Encounter (Signed)
per pof to r/s pt lab-r/s & cld 7 spoke to pt and gave pt time & date

## 2014-10-30 ENCOUNTER — Ambulatory Visit: Payer: Medicaid Other | Admitting: Dietician

## 2014-11-01 ENCOUNTER — Other Ambulatory Visit: Payer: Self-pay | Admitting: *Deleted

## 2014-11-01 DIAGNOSIS — C50912 Malignant neoplasm of unspecified site of left female breast: Secondary | ICD-10-CM

## 2014-11-01 DIAGNOSIS — C50911 Malignant neoplasm of unspecified site of right female breast: Secondary | ICD-10-CM

## 2014-11-04 ENCOUNTER — Ambulatory Visit (HOSPITAL_BASED_OUTPATIENT_CLINIC_OR_DEPARTMENT_OTHER): Payer: Medicaid Other | Admitting: Oncology

## 2014-11-04 ENCOUNTER — Telehealth: Payer: Self-pay | Admitting: Oncology

## 2014-11-04 ENCOUNTER — Other Ambulatory Visit (HOSPITAL_BASED_OUTPATIENT_CLINIC_OR_DEPARTMENT_OTHER): Payer: Medicaid Other

## 2014-11-04 VITALS — BP 121/58 | HR 72 | Temp 98.1°F | Resp 18 | Ht 65.0 in | Wt 249.9 lb

## 2014-11-04 DIAGNOSIS — C50812 Malignant neoplasm of overlapping sites of left female breast: Secondary | ICD-10-CM

## 2014-11-04 DIAGNOSIS — Z853 Personal history of malignant neoplasm of breast: Secondary | ICD-10-CM

## 2014-11-04 DIAGNOSIS — C50911 Malignant neoplasm of unspecified site of right female breast: Secondary | ICD-10-CM

## 2014-11-04 DIAGNOSIS — R0602 Shortness of breath: Secondary | ICD-10-CM

## 2014-11-04 DIAGNOSIS — C50912 Malignant neoplasm of unspecified site of left female breast: Secondary | ICD-10-CM

## 2014-11-04 DIAGNOSIS — I890 Lymphedema, not elsewhere classified: Secondary | ICD-10-CM

## 2014-11-04 DIAGNOSIS — R05 Cough: Secondary | ICD-10-CM

## 2014-11-04 DIAGNOSIS — G8929 Other chronic pain: Secondary | ICD-10-CM

## 2014-11-04 DIAGNOSIS — M792 Neuralgia and neuritis, unspecified: Secondary | ICD-10-CM

## 2014-11-04 DIAGNOSIS — R52 Pain, unspecified: Secondary | ICD-10-CM

## 2014-11-04 DIAGNOSIS — Z170 Estrogen receptor positive status [ER+]: Secondary | ICD-10-CM

## 2014-11-04 DIAGNOSIS — C773 Secondary and unspecified malignant neoplasm of axilla and upper limb lymph nodes: Secondary | ICD-10-CM

## 2014-11-04 DIAGNOSIS — Z720 Tobacco use: Secondary | ICD-10-CM

## 2014-11-04 LAB — CBC WITH DIFFERENTIAL/PLATELET
BASO%: 0.8 % (ref 0.0–2.0)
Basophils Absolute: 0.1 10*3/uL (ref 0.0–0.1)
EOS%: 1.7 % (ref 0.0–7.0)
Eosinophils Absolute: 0.2 10*3/uL (ref 0.0–0.5)
HCT: 43.7 % (ref 34.8–46.6)
HGB: 13.9 g/dL (ref 11.6–15.9)
LYMPH%: 19.1 % (ref 14.0–49.7)
MCH: 27.9 pg (ref 25.1–34.0)
MCHC: 31.9 g/dL (ref 31.5–36.0)
MCV: 87.6 fL (ref 79.5–101.0)
MONO#: 0.6 10*3/uL (ref 0.1–0.9)
MONO%: 6.4 % (ref 0.0–14.0)
NEUT#: 7.2 10*3/uL — ABNORMAL HIGH (ref 1.5–6.5)
NEUT%: 72 % (ref 38.4–76.8)
Platelets: 519 10*3/uL — ABNORMAL HIGH (ref 145–400)
RBC: 4.99 10*6/uL (ref 3.70–5.45)
RDW: 15 % — ABNORMAL HIGH (ref 11.2–14.5)
WBC: 10 10*3/uL (ref 3.9–10.3)
lymph#: 1.9 10*3/uL (ref 0.9–3.3)

## 2014-11-04 LAB — COMPREHENSIVE METABOLIC PANEL (CC13)
ALK PHOS: 70 U/L (ref 40–150)
ALT: 22 U/L (ref 0–55)
ANION GAP: 8 meq/L (ref 3–11)
AST: 23 U/L (ref 5–34)
Albumin: 3.6 g/dL (ref 3.5–5.0)
BUN: 12.7 mg/dL (ref 7.0–26.0)
CO2: 28 meq/L (ref 22–29)
CREATININE: 1.1 mg/dL (ref 0.6–1.1)
Calcium: 9.6 mg/dL (ref 8.4–10.4)
Chloride: 103 mEq/L (ref 98–109)
Glucose: 103 mg/dl (ref 70–140)
Potassium: 3.3 mEq/L — ABNORMAL LOW (ref 3.5–5.1)
SODIUM: 140 meq/L (ref 136–145)
Total Bilirubin: 0.49 mg/dL (ref 0.20–1.20)
Total Protein: 6.8 g/dL (ref 6.4–8.3)

## 2014-11-04 MED ORDER — METRONIDAZOLE 500 MG PO TABS: 2000.0000 mg | ORAL_TABLET | Freq: Once | ORAL | Status: DC

## 2014-11-04 MED ORDER — METHADONE HCL 5 MG PO TABS: 15.0000 mg | ORAL_TABLET | Freq: Three times a day (TID) | ORAL | Status: DC

## 2014-11-04 NOTE — Telephone Encounter (Signed)
per pof to sch pt appt-gave pt copy of sch-adv pt CS will call to sch pt CT-pt understood
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
## 2014-11-06 ENCOUNTER — Ambulatory Visit
Admission: RE | Admit: 2014-11-06 | Discharge: 2014-11-06 | Disposition: A | Discharge status: Home / Self Care | Payer: Medicaid Other | Source: Ambulatory Visit | Attending: Family Medicine | Admitting: Family Medicine

## 2014-11-06 ENCOUNTER — Other Ambulatory Visit: Payer: Medicaid Other

## 2014-11-06 ENCOUNTER — Other Ambulatory Visit: Payer: Self-pay | Admitting: Family Medicine

## 2014-11-06 DIAGNOSIS — N63 Unspecified lump in unspecified breast: Secondary | ICD-10-CM

## 2014-11-11 ENCOUNTER — Ambulatory Visit (HOSPITAL_COMMUNITY): Payer: Medicaid Other

## 2014-11-12 ENCOUNTER — Telehealth: Payer: Self-pay | Admitting: Emergency Medicine

## 2014-11-12 ENCOUNTER — Ambulatory Visit (HOSPITAL_COMMUNITY): Payer: Medicaid Other

## 2014-11-12 NOTE — Telephone Encounter (Signed)
Patient called regarding upcoming CT chest scan scheduled for 12/9; Patient is requesting that her abd be scanned as well. Patient states that her abd "isnt right" and states that "one side is bigger than the other" in regards to her abd. This message will be given for Dr [name] for further direction.

## 2014-11-15 ENCOUNTER — Other Ambulatory Visit: Payer: Self-pay | Admitting: *Deleted

## 2014-11-15 DIAGNOSIS — R7989 Other specified abnormal findings of blood chemistry: Secondary | ICD-10-CM

## 2014-11-15 DIAGNOSIS — C50911 Malignant neoplasm of unspecified site of right female breast: Secondary | ICD-10-CM

## 2014-11-15 DIAGNOSIS — C50912 Malignant neoplasm of unspecified site of left female breast: Secondary | ICD-10-CM

## 2014-11-22 ENCOUNTER — Telehealth: Payer: Self-pay | Admitting: *Deleted

## 2014-11-22 NOTE — Telephone Encounter (Signed)
Per Dr. [name] - pt called with results of breast ultrasound. Told pt it was not cancer - it is benign cysts. Pt appreciative of call.

## 2014-11-27 ENCOUNTER — Other Ambulatory Visit: Payer: Self-pay | Admitting: Oncology

## 2014-11-27 ENCOUNTER — Encounter (HOSPITAL_COMMUNITY): Payer: Self-pay

## 2014-11-27 ENCOUNTER — Ambulatory Visit (HOSPITAL_COMMUNITY)
Admission: RE | Admit: 2014-11-27 | Discharge: 2014-11-27 | Disposition: A | Discharge status: Home / Self Care | Payer: Medicaid Other | Source: Ambulatory Visit | Attending: Oncology | Admitting: Oncology

## 2014-11-27 DIAGNOSIS — R7989 Other specified abnormal findings of blood chemistry: Secondary | ICD-10-CM

## 2014-11-27 DIAGNOSIS — Z9013 Acquired absence of bilateral breasts and nipples: Secondary | ICD-10-CM | POA: Insufficient documentation

## 2014-11-27 DIAGNOSIS — C50912 Malignant neoplasm of unspecified site of left female breast: Secondary | ICD-10-CM

## 2014-11-27 DIAGNOSIS — C50911 Malignant neoplasm of unspecified site of right female breast: Secondary | ICD-10-CM

## 2014-11-27 MED ORDER — IOHEXOL 300 MG/ML  SOLN
100.0000 mL | Freq: Once | INTRAMUSCULAR | Status: AC | PRN
  Administered 2014-11-27: 100 mL via INTRAVENOUS

## 2014-11-28 ENCOUNTER — Encounter (HOSPITAL_COMMUNITY): Payer: Self-pay | Admitting: Cardiology

## 2014-11-29 ENCOUNTER — Other Ambulatory Visit: Payer: Self-pay | Admitting: *Deleted

## 2014-11-29 DIAGNOSIS — C50911 Malignant neoplasm of unspecified site of right female breast: Secondary | ICD-10-CM

## 2014-11-30 NOTE — Telephone Encounter (Signed)
No entry 

## 2014-12-02 ENCOUNTER — Other Ambulatory Visit: Payer: Self-pay | Admitting: *Deleted

## 2014-12-02 ENCOUNTER — Other Ambulatory Visit (HOSPITAL_BASED_OUTPATIENT_CLINIC_OR_DEPARTMENT_OTHER): Payer: Medicaid Other

## 2014-12-02 ENCOUNTER — Encounter: Payer: Self-pay | Admitting: Nurse Practitioner

## 2014-12-02 ENCOUNTER — Telehealth: Payer: Self-pay | Admitting: Nurse Practitioner

## 2014-12-02 ENCOUNTER — Ambulatory Visit (HOSPITAL_BASED_OUTPATIENT_CLINIC_OR_DEPARTMENT_OTHER): Payer: Medicaid Other | Admitting: Nurse Practitioner

## 2014-12-02 VITALS — BP 120/78 | HR 78 | Temp 98.3°F | Resp 19 | Ht 65.0 in | Wt 242.3 lb

## 2014-12-02 DIAGNOSIS — Z720 Tobacco use: Secondary | ICD-10-CM | POA: Insufficient documentation

## 2014-12-02 DIAGNOSIS — C773 Secondary and unspecified malignant neoplasm of axilla and upper limb lymph nodes: Secondary | ICD-10-CM

## 2014-12-02 DIAGNOSIS — C50912 Malignant neoplasm of unspecified site of left female breast: Secondary | ICD-10-CM

## 2014-12-02 DIAGNOSIS — Z853 Personal history of malignant neoplasm of breast: Secondary | ICD-10-CM

## 2014-12-02 DIAGNOSIS — R52 Pain, unspecified: Secondary | ICD-10-CM

## 2014-12-02 DIAGNOSIS — C50911 Malignant neoplasm of unspecified site of right female breast: Secondary | ICD-10-CM

## 2014-12-02 DIAGNOSIS — G893 Neoplasm related pain (acute) (chronic): Secondary | ICD-10-CM

## 2014-12-02 DIAGNOSIS — M792 Neuralgia and neuritis, unspecified: Secondary | ICD-10-CM

## 2014-12-02 DIAGNOSIS — I890 Lymphedema, not elsewhere classified: Secondary | ICD-10-CM

## 2014-12-02 DIAGNOSIS — G8929 Other chronic pain: Secondary | ICD-10-CM

## 2014-12-02 DIAGNOSIS — N898 Other specified noninflammatory disorders of vagina: Secondary | ICD-10-CM | POA: Insufficient documentation

## 2014-12-02 DIAGNOSIS — C50812 Malignant neoplasm of overlapping sites of left female breast: Secondary | ICD-10-CM

## 2014-12-02 DIAGNOSIS — F17200 Nicotine dependence, unspecified, uncomplicated: Secondary | ICD-10-CM

## 2014-12-02 LAB — CBC WITH DIFFERENTIAL/PLATELET
BASO%: 0.3 % (ref 0.0–2.0)
Basophils Absolute: 0 10*3/uL (ref 0.0–0.1)
EOS ABS: 0.1 10*3/uL (ref 0.0–0.5)
EOS%: 1.7 % (ref 0.0–7.0)
HEMATOCRIT: 43.5 % (ref 34.8–46.6)
HGB: 14 g/dL (ref 11.6–15.9)
LYMPH#: 2 10*3/uL (ref 0.9–3.3)
LYMPH%: 24.6 % (ref 14.0–49.7)
MCH: 28.1 pg (ref 25.1–34.0)
MCHC: 32.1 g/dL (ref 31.5–36.0)
MCV: 87.4 fL (ref 79.5–101.0)
MONO#: 0.6 10*3/uL (ref 0.1–0.9)
MONO%: 6.9 % (ref 0.0–14.0)
NEUT#: 5.3 10*3/uL (ref 1.5–6.5)
NEUT%: 66.5 % (ref 38.4–76.8)
Platelets: 505 10*3/uL — ABNORMAL HIGH (ref 145–400)
RBC: 4.98 10*6/uL (ref 3.70–5.45)
RDW: 15 % — AB (ref 11.2–14.5)
WBC: 8 10*3/uL (ref 3.9–10.3)

## 2014-12-02 LAB — COMPREHENSIVE METABOLIC PANEL (CC13)
ALBUMIN: 3.6 g/dL (ref 3.5–5.0)
ALT: 22 U/L (ref 0–55)
ANION GAP: 12 meq/L — AB (ref 3–11)
AST: 21 U/L (ref 5–34)
Alkaline Phosphatase: 77 U/L (ref 40–150)
BUN: 13 mg/dL (ref 7.0–26.0)
CALCIUM: 9.5 mg/dL (ref 8.4–10.4)
CHLORIDE: 105 meq/L (ref 98–109)
CO2: 25 mEq/L (ref 22–29)
Creatinine: 1.2 mg/dL — ABNORMAL HIGH (ref 0.6–1.1)
EGFR: 61 mL/min/{1.73_m2} — AB (ref >90)
GLUCOSE: 109 mg/dL (ref 70–140)
POTASSIUM: 3.2 meq/L — AB (ref 3.5–5.1)
Sodium: 142 mEq/L (ref 136–145)
Total Bilirubin: 0.29 mg/dL (ref 0.20–1.20)
Total Protein: 6.8 g/dL (ref 6.4–8.3)

## 2014-12-02 MED ORDER — ESTRADIOL 10 MCG VA TABS: 1.0000 | ORAL_TABLET | VAGINAL | Status: DC

## 2014-12-02 MED ORDER — METHADONE HCL 5 MG PO TABS: 15.0000 mg | ORAL_TABLET | Freq: Three times a day (TID) | ORAL | Status: DC

## 2014-12-02 NOTE — Telephone Encounter (Signed)
gave pt copy of sch °
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
## 2014-12-03 ENCOUNTER — Other Ambulatory Visit: Payer: Self-pay | Admitting: *Deleted

## 2014-12-03 ENCOUNTER — Telehealth: Payer: Self-pay | Admitting: *Deleted

## 2014-12-03 DIAGNOSIS — E876 Hypokalemia: Secondary | ICD-10-CM

## 2014-12-03 DIAGNOSIS — C50919 Malignant neoplasm of unspecified site of unspecified female breast: Secondary | ICD-10-CM

## 2014-12-03 MED ORDER — TAMOXIFEN CITRATE 20 MG PO TABS: 20.0000 mg | ORAL_TABLET | Freq: Every day | ORAL | Status: DC

## 2014-12-03 MED ORDER — POTASSIUM CHLORIDE CRYS ER 20 MEQ PO TBCR: 40.0000 meq | EXTENDED_RELEASE_TABLET | Freq: Every day | ORAL | Status: DC

## 2014-12-03 NOTE — Telephone Encounter (Signed)
Called pt to inform her of potassium level (3.2L). Asked pt if she had been taking her potassium supplement. Pt stated, " I have been taking just 1 pill but started taking 2 pills yesterday because I felt tired and legs were cramping". Communicated with pt that NP wants her to take two 20 mEq tablets of potassium daily. I will send a refill for potassium to her pharmacy per Christeen Douglas, also I will refill the tamoxifen as well. Pt verbalized understanding. No further concerns.

## 2014-12-03 NOTE — Telephone Encounter (Signed)
-----   Message from Marcelino Duster, NP sent at 12/03/2014  1:15 PM EST ----- Please call patient and find out if she is taking her potassium supplement as directed. If she is, direct her to increase it to BID.

## 2014-12-05 ENCOUNTER — Ambulatory Visit: Payer: Medicaid Other | Admitting: Dietician

## 2014-12-10 ENCOUNTER — Ambulatory Visit: Payer: Medicaid Other | Admitting: Physical Therapy

## 2014-12-31 ENCOUNTER — Telehealth: Payer: Self-pay | Admitting: *Deleted

## 2014-12-31 ENCOUNTER — Ambulatory Visit: Payer: Medicaid Other | Admitting: Physical Therapy

## 2014-12-31 NOTE — Telephone Encounter (Signed)
Pt called with complaints that she has a cold . T-Max is 99.2 which was her temp last night. While I had pt on phone, she took  her temp. It  is 98.9 orally. Pt has no complaints of any aches and pains. She has been taking Coricidin HBP for 2-3 days, but seen little results. I suggested that she take Mucinex for cough and congestion. She said she feels nasal stuffiness but nothing is coming out. Pt has a non productive cough also. Pt is drinking plenty of fluids and I encouraged her to keep doing so. Appetite is diminished some but still eating. I will f/u and call pt on Friday to check on her. I told pt if conditions worsens to give [name] a call @ [phone] or if she should start to have a fever. Message to be forwarded to [facility].

## 2015-01-03 ENCOUNTER — Telehealth: Payer: Self-pay | Admitting: *Deleted

## 2015-01-03 NOTE — Telephone Encounter (Signed)
F/u with pt from earlier this week. Pt had called with cold-like symptoms that had been going on for about 4 days. She complained of a cough and congestion but no fever or aches. I suggested to pt to take Mucinex C/C formula and to increase her fluid intake. Pt tells me she is feeling much better for the past two days. No further concerns. Message to be forwarded to [facility].

## 2015-01-06 ENCOUNTER — Telehealth: Payer: Self-pay | Admitting: *Deleted

## 2015-01-06 ENCOUNTER — Telehealth: Payer: Self-pay | Admitting: Oncology

## 2015-01-06 ENCOUNTER — Other Ambulatory Visit: Payer: Self-pay | Admitting: *Deleted

## 2015-01-06 NOTE — Telephone Encounter (Signed)
s.w pt and advised of her new appt with Dr. Jannifer Rodney on 1.21 ok and aware

## 2015-01-06 NOTE — Telephone Encounter (Signed)
Pt called to ask for Rx for Methadone. I will have it ready for pickup on 01/08/2015. Pt also requested her next appt visit  to be with her oncologist, Dr. [name]. Pt was very nice about it but said she just need to see her doctor next time. Pt said she has specific questions for him.

## 2015-01-07 ENCOUNTER — Other Ambulatory Visit: Payer: Self-pay | Admitting: *Deleted

## 2015-01-07 DIAGNOSIS — M792 Neuralgia and neuritis, unspecified: Secondary | ICD-10-CM

## 2015-01-07 DIAGNOSIS — G8929 Other chronic pain: Secondary | ICD-10-CM

## 2015-01-07 DIAGNOSIS — Z853 Personal history of malignant neoplasm of breast: Secondary | ICD-10-CM

## 2015-01-07 MED ORDER — METHADONE HCL 5 MG PO TABS: 15.0000 mg | ORAL_TABLET | Freq: Three times a day (TID) | ORAL | Status: DC

## 2015-01-08 ENCOUNTER — Ambulatory Visit: Payer: Medicaid Other | Admitting: Dietician

## 2015-01-15 ENCOUNTER — Ambulatory Visit: Payer: Medicaid Other | Attending: Oncology | Admitting: Physical Therapy

## 2015-01-15 DIAGNOSIS — I890 Lymphedema, not elsewhere classified: Secondary | ICD-10-CM | POA: Diagnosis present

## 2015-01-15 DIAGNOSIS — C50912 Malignant neoplasm of unspecified site of left female breast: Secondary | ICD-10-CM | POA: Diagnosis not present
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
## 2015-01-23 ENCOUNTER — Ambulatory Visit: Payer: Medicaid Other | Admitting: Physical Therapy

## 2015-01-29 ENCOUNTER — Ambulatory Visit: Payer: Medicaid Other | Attending: Oncology | Admitting: Physical Therapy

## 2015-01-29 DIAGNOSIS — I890 Lymphedema, not elsewhere classified: Secondary | ICD-10-CM

## 2015-01-29 DIAGNOSIS — C50912 Malignant neoplasm of unspecified site of left female breast: Secondary | ICD-10-CM | POA: Diagnosis not present
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
## 2015-01-29 NOTE — Patient Instructions (Signed)
Deep Effective Breath   Standing, sitting, or laying down, place both hands on the belly. Take a deep breath IN, expanding the belly; then breath OUT, contracting the belly. Repeat __5__ times. Do __2-3__ sessions per day and before your self massage.  http://gt2.exer.us/866   Copyright  VHI. All rights reserved.  Axilla to Axilla - Sweep   On uninvolved side make 5 circles in the armpit, then pump _5__ times from involved armpit across chest to uninvolved armpit, making a pathway. Do _1__ time per day.  Copyright  VHI. All rights reserved.  Axilla to Inguinal Nodes - Sweep   On involved side, make 5 circles at groin at panty line, then pump _5__ times from armpit along side of trunk to outer hip, making your other pathway. Do __1_ time per day.  Copyright  VHI. All rights reserved.  Arm Posterior: Elbow to Shoulder - Sweep   Pump _5__ times from back of elbow to top of shoulder. Then inner to outer upper arm _5_ times, then outer arm again _5_ times. Then back to the pathways _2-3_ times. Do _1__ time per day.  Copyright  VHI. All rights reserved.  ARM: [place] Wrist to Elbow - Sweep   Pump or stationary circles _5__ times from wrist to elbow making sure to do both sides of the forearm. Then retrace your steps to the outer arm, and the pathways _2-3_ times each. Do _1__ time per day.  Copyright  VHI. All rights reserved.  ARM: Dorsum of Hand to Shoulder - Sweep   Pump or stationary circles _5__ times on back of hand including knuckle spaces and individual fingers if needed working up towards the wrist, then retrace all your steps working back up the forearm, doing both sides; upper outer arm and back to your pathways _2-3_ times each. Then do 5 circles again at uninvolved armpit and involved groin where you started! Good job!! Do __1_ time per day.  Copyright  VHI. All rights reserved.      

## 2015-02-03 ENCOUNTER — Other Ambulatory Visit: Payer: Self-pay | Admitting: *Deleted

## 2015-02-03 DIAGNOSIS — M792 Neuralgia and neuritis, unspecified: Secondary | ICD-10-CM

## 2015-02-03 DIAGNOSIS — Z853 Personal history of malignant neoplasm of breast: Secondary | ICD-10-CM

## 2015-02-03 DIAGNOSIS — G8929 Other chronic pain: Secondary | ICD-10-CM

## 2015-02-03 MED ORDER — METHADONE HCL 5 MG PO TABS: 15.0000 mg | ORAL_TABLET | Freq: Three times a day (TID) | ORAL | Status: DC

## 2015-02-05 ENCOUNTER — Ambulatory Visit: Payer: Medicaid Other | Admitting: Physical Therapy

## 2015-02-13 ENCOUNTER — Encounter: Payer: Medicaid Other | Admitting: Physical Therapy

## 2015-02-14 ENCOUNTER — Telehealth: Payer: Self-pay | Admitting: *Deleted

## 2015-02-14 NOTE — Telephone Encounter (Signed)
Returned pt's call  to inform her we can make a referral to [facility] for a PCP. I also asked pt to describe the pain she is having to her chest area where the benign cyst is. Pt says it really doesn't hurt but is tender to touch and it comes and go. Pt said her PCP called her and says it's good to keep appt with Dr. [name] as scheduled and to let him know about the discomfort.

## 2015-02-14 NOTE — Telephone Encounter (Signed)
Returned pt's call concerning a referral for a new PCP. Dr. Jarold Song will be leaving her practice to join [facility]. Pt saw her PCP yesterday. Dr. Jarold Song was concerned that the benign cyst that pt has to chest is now getting bigger and pt expressed having pain. Pt feels like she can wait until 03/10/15 when she sees Dr. [name] concerning this. Pt was also dx with gout and is being treated. I will call pt back after I consult with provider. Message to be forwarded to [facility].

## 2015-02-20 ENCOUNTER — Telehealth: Payer: Self-pay | Admitting: Adult Health

## 2015-02-20 ENCOUNTER — Telehealth: Payer: Self-pay | Admitting: *Deleted

## 2015-02-20 NOTE — Telephone Encounter (Signed)
Returned pt's phone call to confirm her upcoming appts  so she can arrange her transportation. Communicated to pt that she  will have labs on Mar 14@12 :00p and doctor's appt on Mar 21@ 10:00a. Pt verbalized understanding. No further concerns.

## 2015-02-20 NOTE — Telephone Encounter (Signed)
I received a call from Ms. Cherne regarding her hope to be involved with survivorship and/or marketing for the cancer center.  She expressed how grateful she has been to everyone who helped care for her during her cancer experience and wants to share her story with others.  I encouraged her to reach out to our cancer center marketing representative on opportunities for her to be involved as needed.   With regards to survivorship, I encouraged her to save-the-date for our Cancer Survivor Day celebration that will be happening in June 2016.  She expressed excitement about continuing to be involved with things going on at the cancer center. I thanked her for calling and encouraged her to reach out again in the future, if needed.   Mike Craze, NP [place] [facility] [phone]

## 2015-02-25 ENCOUNTER — Ambulatory Visit: Payer: Medicaid Other | Admitting: Physical Therapy

## 2015-02-25 ENCOUNTER — Telehealth: Payer: Self-pay | Admitting: Physical Therapy

## 2015-02-27 ENCOUNTER — Telehealth: Payer: Self-pay | Admitting: *Deleted

## 2015-02-27 NOTE — Telephone Encounter (Signed)
PT. IS COMING FOR LAB ON Monday, 03/03/15, AT 12 NOON. SHE WOULD LIKE TO PICK UP HER METHADONE PRESCRIPTION. THIS NOTE WAS SENT TO VAL DODD,RN.

## 2015-02-28 ENCOUNTER — Other Ambulatory Visit: Payer: Self-pay | Admitting: *Deleted

## 2015-02-28 DIAGNOSIS — C50912 Malignant neoplasm of unspecified site of left female breast: Secondary | ICD-10-CM

## 2015-02-28 DIAGNOSIS — C50911 Malignant neoplasm of unspecified site of right female breast: Secondary | ICD-10-CM

## 2015-03-03 ENCOUNTER — Other Ambulatory Visit (HOSPITAL_BASED_OUTPATIENT_CLINIC_OR_DEPARTMENT_OTHER): Payer: Medicaid Other

## 2015-03-03 ENCOUNTER — Other Ambulatory Visit: Payer: Self-pay | Admitting: Emergency Medicine

## 2015-03-03 DIAGNOSIS — M792 Neuralgia and neuritis, unspecified: Secondary | ICD-10-CM

## 2015-03-03 DIAGNOSIS — C50912 Malignant neoplasm of unspecified site of left female breast: Secondary | ICD-10-CM

## 2015-03-03 DIAGNOSIS — C50911 Malignant neoplasm of unspecified site of right female breast: Secondary | ICD-10-CM

## 2015-03-03 DIAGNOSIS — Z853 Personal history of malignant neoplasm of breast: Secondary | ICD-10-CM

## 2015-03-03 DIAGNOSIS — C50812 Malignant neoplasm of overlapping sites of left female breast: Secondary | ICD-10-CM

## 2015-03-03 DIAGNOSIS — C773 Secondary and unspecified malignant neoplasm of axilla and upper limb lymph nodes: Secondary | ICD-10-CM

## 2015-03-03 DIAGNOSIS — G8929 Other chronic pain: Secondary | ICD-10-CM

## 2015-03-03 LAB — COMPREHENSIVE METABOLIC PANEL (CC13)
ALT: 28 U/L (ref 0–55)
ANION GAP: 10 meq/L (ref 3–11)
AST: 25 U/L (ref 5–34)
Albumin: 3.5 g/dL (ref 3.5–5.0)
Alkaline Phosphatase: 70 U/L (ref 40–150)
BUN: 13 mg/dL (ref 7.0–26.0)
CO2: 23 meq/L (ref 22–29)
Calcium: 9.4 mg/dL (ref 8.4–10.4)
Chloride: 107 mEq/L (ref 98–109)
Creatinine: 1 mg/dL (ref 0.6–1.1)
EGFR: 79 mL/min/{1.73_m2} — ABNORMAL LOW (ref >90)
Glucose: 97 mg/dl (ref 70–140)
POTASSIUM: 3.7 meq/L (ref 3.5–5.1)
SODIUM: 141 meq/L (ref 136–145)
Total Bilirubin: 0.45 mg/dL (ref 0.20–1.20)
Total Protein: 6.6 g/dL (ref 6.4–8.3)

## 2015-03-03 LAB — CBC WITH DIFFERENTIAL/PLATELET
BASO%: 1.3 % (ref 0.0–2.0)
Basophils Absolute: 0.1 10*3/uL (ref 0.0–0.1)
EOS%: 1.5 % (ref 0.0–7.0)
Eosinophils Absolute: 0.1 10*3/uL (ref 0.0–0.5)
HCT: 42.4 % (ref 34.8–46.6)
HEMOGLOBIN: 13.6 g/dL (ref 11.6–15.9)
LYMPH#: 2 10*3/uL (ref 0.9–3.3)
LYMPH%: 23 % (ref 14.0–49.7)
MCH: 28 pg (ref 25.1–34.0)
MCHC: 32 g/dL (ref 31.5–36.0)
MCV: 87.3 fL (ref 79.5–101.0)
MONO#: 0.6 10*3/uL (ref 0.1–0.9)
MONO%: 6.6 % (ref 0.0–14.0)
NEUT%: 67.6 % (ref 38.4–76.8)
NEUTROS ABS: 5.8 10*3/uL (ref 1.5–6.5)
Platelets: 523 10*3/uL — ABNORMAL HIGH (ref 145–400)
RBC: 4.86 10*6/uL (ref 3.70–5.45)
RDW: 15.2 % — AB (ref 11.2–14.5)
WBC: 8.6 10*3/uL (ref 3.9–10.3)

## 2015-03-03 MED ORDER — METHADONE HCL 5 MG PO TABS: 15.0000 mg | ORAL_TABLET | Freq: Three times a day (TID) | ORAL | Status: DC

## 2015-03-06 ENCOUNTER — Telehealth: Payer: Self-pay | Admitting: Adult Health
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
## 2015-03-10 ENCOUNTER — Ambulatory Visit: Payer: Medicaid Other | Admitting: Nurse Practitioner

## 2015-03-10 ENCOUNTER — Other Ambulatory Visit: Payer: Self-pay | Admitting: *Deleted

## 2015-03-10 ENCOUNTER — Telehealth: Payer: Self-pay | Admitting: Oncology

## 2015-03-10 ENCOUNTER — Ambulatory Visit (HOSPITAL_BASED_OUTPATIENT_CLINIC_OR_DEPARTMENT_OTHER): Payer: Medicaid Other | Admitting: Oncology

## 2015-03-10 VITALS — BP 125/74 | HR 85 | Temp 98.1°F | Resp 18 | Ht 65.0 in | Wt 242.0 lb

## 2015-03-10 DIAGNOSIS — C50919 Malignant neoplasm of unspecified site of unspecified female breast: Secondary | ICD-10-CM

## 2015-03-10 DIAGNOSIS — G893 Neoplasm related pain (acute) (chronic): Secondary | ICD-10-CM

## 2015-03-10 DIAGNOSIS — D473 Essential (hemorrhagic) thrombocythemia: Secondary | ICD-10-CM | POA: Insufficient documentation

## 2015-03-10 DIAGNOSIS — C773 Secondary and unspecified malignant neoplasm of axilla and upper limb lymph nodes: Secondary | ICD-10-CM

## 2015-03-10 DIAGNOSIS — E876 Hypokalemia: Secondary | ICD-10-CM

## 2015-03-10 DIAGNOSIS — C50912 Malignant neoplasm of unspecified site of left female breast: Secondary | ICD-10-CM

## 2015-03-10 DIAGNOSIS — I890 Lymphedema, not elsewhere classified: Secondary | ICD-10-CM

## 2015-03-10 DIAGNOSIS — R9430 Abnormal result of cardiovascular function study, unspecified: Secondary | ICD-10-CM

## 2015-03-10 DIAGNOSIS — I10 Essential (primary) hypertension: Secondary | ICD-10-CM

## 2015-03-10 DIAGNOSIS — E785 Hyperlipidemia, unspecified: Secondary | ICD-10-CM

## 2015-03-10 DIAGNOSIS — M792 Neuralgia and neuritis, unspecified: Secondary | ICD-10-CM

## 2015-03-10 DIAGNOSIS — G8929 Other chronic pain: Secondary | ICD-10-CM

## 2015-03-10 DIAGNOSIS — M109 Gout, unspecified: Secondary | ICD-10-CM

## 2015-03-10 DIAGNOSIS — Z720 Tobacco use: Secondary | ICD-10-CM

## 2015-03-10 DIAGNOSIS — D75839 Thrombocytosis, unspecified: Secondary | ICD-10-CM

## 2015-03-10 DIAGNOSIS — C50911 Malignant neoplasm of unspecified site of right female breast: Secondary | ICD-10-CM

## 2015-03-10 DIAGNOSIS — M1990 Unspecified osteoarthritis, unspecified site: Secondary | ICD-10-CM

## 2015-03-10 DIAGNOSIS — C50812 Malignant neoplasm of overlapping sites of left female breast: Secondary | ICD-10-CM

## 2015-03-10 DIAGNOSIS — R7989 Other specified abnormal findings of blood chemistry: Secondary | ICD-10-CM

## 2015-03-10 MED ORDER — POTASSIUM CHLORIDE CRYS ER 20 MEQ PO TBCR: 40.0000 meq | EXTENDED_RELEASE_TABLET | Freq: Every day | ORAL | Status: DC

## 2015-03-10 NOTE — Telephone Encounter (Signed)
per pof ot sch pt appt-gave pt copy of sch °
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
## 2015-03-17 ENCOUNTER — Other Ambulatory Visit: Payer: Self-pay | Admitting: *Deleted

## 2015-03-17 ENCOUNTER — Telehealth: Payer: Self-pay | Admitting: *Deleted

## 2015-03-17 NOTE — Telephone Encounter (Signed)
Val I wonder if you can help your friend with this.Marland KitchenMarland KitchenMarland Kitchen

## 2015-03-17 NOTE — Telephone Encounter (Signed)
This RN called pt per her inquiry to inform her of need to contact the 1-800 number on the back of her medicaid card for who is accepting medicaid patients at this time- she can then call and make appointment.  [name] verbalized understanding.

## 2015-03-17 NOTE — Telephone Encounter (Signed)
HAS DR.MAGRINAT FOUND PT. A PRIMARY CARE PHYSICIAN?

## 2015-03-25 ENCOUNTER — Telehealth: Payer: Self-pay | Admitting: *Deleted

## 2015-03-25 NOTE — Telephone Encounter (Signed)
Patient called stating that she is having left leg pain that began yesterday. She currently is taking methadone for other source of pain. Instructed patient to call Primary Care Physician. Patient verbalized understanding.

## 2015-03-31 ENCOUNTER — Other Ambulatory Visit: Payer: Self-pay | Admitting: *Deleted

## 2015-03-31 DIAGNOSIS — M792 Neuralgia and neuritis, unspecified: Secondary | ICD-10-CM

## 2015-03-31 DIAGNOSIS — G8929 Other chronic pain: Secondary | ICD-10-CM

## 2015-03-31 DIAGNOSIS — Z853 Personal history of malignant neoplasm of breast: Secondary | ICD-10-CM

## 2015-03-31 MED ORDER — METHADONE HCL 5 MG PO TABS: 15.0000 mg | ORAL_TABLET | Freq: Three times a day (TID) | ORAL | Status: DC

## 2015-03-31 NOTE — Telephone Encounter (Signed)
Refill: Methadone. Patient notified and verbalized understaning. Will pick up on 04/04/15

## 2015-04-01 ENCOUNTER — Telehealth: Payer: Self-pay | Admitting: *Deleted

## 2015-04-01 NOTE — Telephone Encounter (Signed)
NOTIFIED PT. THAT PRESCRIPTION IS READY. SHE WILL PICK IT UP Friday.

## 2015-04-03 ENCOUNTER — Encounter: Payer: Self-pay | Admitting: Physical Therapy

## 2015-04-03 ENCOUNTER — Ambulatory Visit: Payer: Medicaid Other | Attending: Oncology | Admitting: Physical Therapy

## 2015-04-03 DIAGNOSIS — C50912 Malignant neoplasm of unspecified site of left female breast: Secondary | ICD-10-CM

## 2015-04-03 DIAGNOSIS — I890 Lymphedema, not elsewhere classified: Secondary | ICD-10-CM | POA: Insufficient documentation
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
## 2015-04-03 NOTE — Patient Instructions (Signed)

## 2015-04-15 ENCOUNTER — Encounter: Payer: Self-pay | Admitting: *Deleted

## 2015-04-15 NOTE — [facility] Oncology Navigator Note (Unsigned)
I received a referral from [place].  She asked that I follow up with patient due to smoking history and she is wanting to quit.  I called and spoke with patient.  She stated she is ready to quit.  She asked me questions about quitting and I answered.  I have her numbers to call and resources to help her quit.  I asked that she call me at the cancer center if she needs help.  She stated she would.

## 2015-04-28 ENCOUNTER — Ambulatory Visit: Payer: Medicaid Other | Attending: Oncology | Admitting: Physical Therapy

## 2015-04-28 DIAGNOSIS — C50912 Malignant neoplasm of unspecified site of left female breast: Secondary | ICD-10-CM | POA: Diagnosis not present

## 2015-04-28 DIAGNOSIS — I890 Lymphedema, not elsewhere classified: Secondary | ICD-10-CM | POA: Insufficient documentation
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
## 2015-04-28 NOTE — Patient Instructions (Addendum)
Deep Effective Breath   Standing, sitting, or laying down, place both hands on the belly. Take a deep breath IN, expanding the belly; then breath OUT, contracting the belly. Repeat __5__ times. Do __2-3__ sessions per day and before your self massage.  http://gt2.exer.us/866   Copyright  VHI. All rights reserved.  Axilla to Axilla - Sweep   On uninvolved side make 5 circles in the armpit, then pump _5__ times from involved armpit across chest to uninvolved armpit, making a pathway. Do _1__ time per day.  Copyright  VHI. All rights reserved.  Axilla to Inguinal Nodes - Sweep   On involved side, make 5 circles at groin at panty line, then pump _5__ times from armpit along side of trunk to outer hip, making your other pathway. Do __1_ time per day.  Copyright  VHI. All rights reserved.  Arm Posterior: Elbow to Shoulder - Sweep   Pump _5__ times from back of elbow to top of shoulder. Then inner to outer upper arm _5_ times, then outer arm again _5_ times. Then back to the pathways _2-3_ times. Do _1__ time per day.  Copyright  VHI. All rights reserved.  ARM: Volar Wrist to Elbow - Sweep   Pump or stationary circles _5__ times from wrist to elbow making sure to do both sides of the forearm. Then retrace your steps to the outer arm, and the pathways _2-3_ times each. Do _1__ time per day.  Copyright  VHI. All rights reserved.  ARM: Dorsum of Hand to Shoulder - Sweep   Pump or stationary circles _5__ times on back of hand including knuckle spaces and individual fingers if needed working up towards the wrist, then retrace all your steps working back up the forearm, doing both sides; upper outer arm and back to your pathways _2-3_ times each. Then do 5 circles again at uninvolved armpit and involved groin where you started! Good job!! Do __1_ time per day.  Copyright  VHI. All rights reserved.     Discussed need for patient to continue her walking program for exercise as  well as the core strengthening program she was given last session and which was reprinted for her today.  Reviewed manual lymph drainage technique as well as advised to continue use of pump daily, use of nighttime compression garment, and daytime sleeves.  Pt. Was wearing a sleeve and glove today that appeared to fit well, though her had looked a bit puffy at dorsal surface when garments were removed.

## 2015-04-30 ENCOUNTER — Telehealth: Payer: Self-pay | Admitting: *Deleted

## 2015-04-30 NOTE — Telephone Encounter (Signed)
TC from patient requesting call back from [facility] RN with Dr. [name]. Pt. States she wants to quit smoking and has gotten some nicoderm patches. She wants to know if it is safe to take these from Dr. Virgie Dad perspective.  Also, she will need her Methadone refilled by 05/06/15

## 2015-05-01 NOTE — Telephone Encounter (Signed)
It is safe to use  Patches. She will have to drop in for methadone scripts.

## 2015-05-02 NOTE — Telephone Encounter (Signed)
Call made to patient and relayed Dr. Virgie Dad message to her-that it is ok to use the nicotine patches, following the directions listed on the package.  Pt. States she will come by [facility] on Tuesday, 05/06/15 to pick up her Methodone prescription.

## 2015-05-05 ENCOUNTER — Other Ambulatory Visit: Payer: Self-pay | Admitting: *Deleted

## 2015-05-05 DIAGNOSIS — Z853 Personal history of malignant neoplasm of breast: Secondary | ICD-10-CM

## 2015-05-05 DIAGNOSIS — M792 Neuralgia and neuritis, unspecified: Secondary | ICD-10-CM

## 2015-05-05 DIAGNOSIS — G8929 Other chronic pain: Secondary | ICD-10-CM

## 2015-05-05 MED ORDER — METHADONE HCL 5 MG PO TABS: 15.0000 mg | ORAL_TABLET | Freq: Three times a day (TID) | ORAL | Status: DC

## 2015-05-08 ENCOUNTER — Other Ambulatory Visit: Payer: Self-pay | Admitting: Nurse Practitioner

## 2015-05-13 ENCOUNTER — Telehealth: Payer: Self-pay | Admitting: *Deleted

## 2015-05-13 NOTE — Telephone Encounter (Signed)
Patient states that she has noticed dark mucosa on both sides of mouth. Patient denies any mouth swelling or pain. Please advise. Message sent to MD [facility] and RN Val.

## 2015-05-15 ENCOUNTER — Other Ambulatory Visit: Payer: Self-pay | Admitting: *Deleted

## 2015-05-16 ENCOUNTER — Telehealth: Payer: Self-pay | Admitting: *Deleted

## 2015-05-16 NOTE — Telephone Encounter (Signed)
VM message from patient re: dark er mucosa inside her mouth. She states it is now a little bit irritated, though she would not classify it as pain. She would like appt to see Dr. [name] sometime in the next 2 weeks.

## 2015-05-16 NOTE — Telephone Encounter (Signed)
See other note

## 2015-05-16 NOTE — Telephone Encounter (Signed)
This RN spoke with pt per mouth concern- pt was very vague in description except that is was not relating" to any tooth area that could be irritating it." " it is more like in the middle of my mouth "  " it doesn't hurt and I have to bring some papers up there too for Dr Jannifer Rodney to fill out because I want to go back to school so I was hoping he could look at it then "  [name] is requesting an appointment " in the next few weeks is ok "  Appointment will be requested.

## 2015-05-20 ENCOUNTER — Other Ambulatory Visit: Payer: Self-pay | Admitting: *Deleted

## 2015-05-20 ENCOUNTER — Telehealth: Payer: Self-pay | Admitting: Oncology

## 2015-05-20 NOTE — Telephone Encounter (Signed)
Noted P.O.F. and patient has been scheduled.  Declined the 05-26-2015 appointment that was offered.

## 2015-05-20 NOTE — Telephone Encounter (Addendum)
"  I need an appointment in June to check mouth out.  I have a dark area inside my mouth that hurts.  It hurts all the time.  Dr. [name] always checks my mouth.  I  Need at least a weeks notice because I use [employer] for transportation." Advised she contact PCP.  "My PCP left so I left.  I will see a new doctor 06-16-2015 and I can tell them about the headaches.  Dr. [name] takes care of my mouth.  Would you have val or Natro call me.  I'd like first available appointment."  Return number [phone].

## 2015-05-20 NOTE — Telephone Encounter (Signed)
Called patient and she declines the 6/6 appointment that was available with dr [name] and was given next avail

## 2015-05-20 NOTE — Telephone Encounter (Signed)
POF sent to scheduling

## 2015-05-26 ENCOUNTER — Other Ambulatory Visit: Payer: Medicaid Other

## 2015-05-26 ENCOUNTER — Ambulatory Visit: Payer: Medicaid Other | Admitting: Oncology

## 2015-05-29 ENCOUNTER — Telehealth: Payer: Self-pay | Admitting: *Deleted

## 2015-05-29 NOTE — Telephone Encounter (Signed)
Please send all similar notes to my desk nurse-- thanks!

## 2015-05-29 NOTE — Telephone Encounter (Signed)
TC from patient this am requesting refill on Methadone.  She states it is usually filled on the 19th of the month but the 19th is on a Sunday for June. Last filled on 05/05/15. She states she can pick up prescription on Wednesday of next week (06/04/15)

## 2015-06-03 ENCOUNTER — Other Ambulatory Visit: Payer: Self-pay | Admitting: *Deleted

## 2015-06-03 DIAGNOSIS — Z853 Personal history of malignant neoplasm of breast: Secondary | ICD-10-CM

## 2015-06-03 DIAGNOSIS — M792 Neuralgia and neuritis, unspecified: Secondary | ICD-10-CM

## 2015-06-03 DIAGNOSIS — G8929 Other chronic pain: Secondary | ICD-10-CM

## 2015-06-03 MED ORDER — METHADONE HCL 5 MG PO TABS: 15.0000 mg | ORAL_TABLET | Freq: Three times a day (TID) | ORAL | Status: DC

## 2015-06-03 NOTE — Telephone Encounter (Signed)
Called patient to let her know that rx was ready to be picked up.

## 2015-06-03 NOTE — Telephone Encounter (Addendum)
Patient called asking "if prescription is ready for pick up.  I depend on others for transportation. Will come tomorrow or Friday to pick up methadone script I called for last week."   No new order observed.  Will notify staff.  Asked that she not come for pick up until called for pick.  "Will you start on this immediately.  Tell Val, Orson Eva and call before Friday because you all close at 4:30."

## 2015-06-04 ENCOUNTER — Other Ambulatory Visit: Payer: Self-pay | Admitting: *Deleted

## 2015-06-19 ENCOUNTER — Telehealth: Payer: Self-pay | Admitting: *Deleted

## 2015-06-19 NOTE — Telephone Encounter (Signed)
Received notification that Dr. [name] will need to cover for another MD.  Called pt and explained and confirmed rescheduled appt for 06/27/15 w/ pt.  Pt request if we could have her prescription for Methadone ready for pick up due to transportation.  I informed her that I'm not a nurse, but would pass this message to her.  Karna Christmas is the nurse for GM today - sent her an inbasket message.

## 2015-06-25 ENCOUNTER — Ambulatory Visit: Payer: Medicaid Other | Admitting: Oncology

## 2015-06-25 ENCOUNTER — Other Ambulatory Visit: Payer: Medicaid Other

## 2015-06-26 ENCOUNTER — Other Ambulatory Visit: Payer: Self-pay | Admitting: *Deleted

## 2015-06-26 DIAGNOSIS — C50912 Malignant neoplasm of unspecified site of left female breast: Secondary | ICD-10-CM

## 2015-06-26 DIAGNOSIS — C50911 Malignant neoplasm of unspecified site of right female breast: Secondary | ICD-10-CM

## 2015-06-27 ENCOUNTER — Encounter: Payer: Medicaid Other | Admitting: Oncology

## 2015-06-27 ENCOUNTER — Other Ambulatory Visit: Payer: Medicaid Other

## 2015-06-30 ENCOUNTER — Telehealth: Payer: Self-pay | Admitting: *Deleted

## 2015-06-30 ENCOUNTER — Other Ambulatory Visit: Payer: Self-pay | Admitting: Oncology

## 2015-06-30 DIAGNOSIS — M792 Neuralgia and neuritis, unspecified: Secondary | ICD-10-CM

## 2015-06-30 DIAGNOSIS — G8929 Other chronic pain: Secondary | ICD-10-CM

## 2015-06-30 DIAGNOSIS — Z853 Personal history of malignant neoplasm of breast: Secondary | ICD-10-CM

## 2015-06-30 MED ORDER — METHADONE HCL 5 MG PO TABS: 15.0000 mg | ORAL_TABLET | Freq: Three times a day (TID) | ORAL | Status: DC

## 2015-06-30 NOTE — Telephone Encounter (Signed)
VM message received @ 8:24 am. Pt requesting refill of her methadone. Last filled on 06/03/15. She can pick up the prescription Friday. Please call patient when the prescription is available.

## 2015-06-30 NOTE — Telephone Encounter (Signed)
It's available.  Thanks!

## 2015-07-01 ENCOUNTER — Telehealth: Payer: Self-pay | Admitting: *Deleted

## 2015-07-01 NOTE — Telephone Encounter (Signed)
Thank you-- you can reassure her I am a hematologist! Nira Conn sees her 8/4 and we can discuss then  Thanks!

## 2015-07-01 NOTE — Telephone Encounter (Signed)
TC to patient to let her know that her prescription is ready for pick up on Friday, 07/04/15

## 2015-07-01 NOTE — Telephone Encounter (Signed)
TC from pt requesting an appt with a 'hematologist'. She states she had lab work @ her pcp office last week and was told her platelets are very high. She states it is >600K.  Her PCP is Dr. Altha Harm @ [facility] # [phone]. Attempted to call pcp office to get fax'd copy of labs. Office is closed for lunch. Will try again after 2 pm.

## 2015-07-02 NOTE — Telephone Encounter (Signed)
TC to patient and informed her that Dr. [name] is an oncologist/hematologist and will handle her platelet issue. Pt voiced relief and understanding.  No further needs identified. She will see Dr. [name] on 07/24/15

## 2015-07-03 ENCOUNTER — Telehealth: Payer: Self-pay

## 2015-07-03 NOTE — Telephone Encounter (Signed)
Pt called to let us know she will be in between 4 and 415 to pick up her prescription

## 2015-07-15 ENCOUNTER — Ambulatory Visit: Payer: Medicaid Other | Admitting: Family Medicine

## 2015-07-19 ENCOUNTER — Other Ambulatory Visit: Payer: Self-pay | Admitting: Oncology

## 2015-07-22 ENCOUNTER — Other Ambulatory Visit: Payer: Self-pay

## 2015-07-23 ENCOUNTER — Other Ambulatory Visit: Payer: Self-pay | Admitting: *Deleted

## 2015-07-23 DIAGNOSIS — M792 Neuralgia and neuritis, unspecified: Secondary | ICD-10-CM

## 2015-07-23 DIAGNOSIS — G8929 Other chronic pain: Secondary | ICD-10-CM

## 2015-07-23 DIAGNOSIS — I10 Essential (primary) hypertension: Secondary | ICD-10-CM

## 2015-07-23 DIAGNOSIS — Z853 Personal history of malignant neoplasm of breast: Secondary | ICD-10-CM

## 2015-07-23 MED ORDER — METHADONE HCL 5 MG PO TABS: 15.0000 mg | ORAL_TABLET | Freq: Three times a day (TID) | ORAL | Status: DC

## 2015-07-23 MED ORDER — METOPROLOL SUCCINATE ER 100 MG PO TB24: 100.0000 mg | ORAL_TABLET | Freq: Every day | ORAL | Status: DC

## 2015-07-23 MED ORDER — ISOSORBIDE MONONITRATE ER 60 MG PO TB24: 60.0000 mg | ORAL_TABLET | Freq: Every morning | ORAL | Status: DC

## 2015-07-23 NOTE — Telephone Encounter (Signed)
Stacy, if you can refill her chronic pain meds, that would be great!  Thanks!

## 2015-07-24 ENCOUNTER — Ambulatory Visit (HOSPITAL_BASED_OUTPATIENT_CLINIC_OR_DEPARTMENT_OTHER): Payer: Medicaid Other | Admitting: Nurse Practitioner

## 2015-07-24 ENCOUNTER — Encounter: Payer: Self-pay | Admitting: Nurse Practitioner

## 2015-07-24 ENCOUNTER — Telehealth: Payer: Self-pay | Admitting: Nurse Practitioner

## 2015-07-24 ENCOUNTER — Other Ambulatory Visit (HOSPITAL_BASED_OUTPATIENT_CLINIC_OR_DEPARTMENT_OTHER): Payer: Medicaid Other

## 2015-07-24 VITALS — BP 118/73 | HR 117 | Temp 98.7°F | Resp 18 | Ht 65.0 in | Wt 235.4 lb

## 2015-07-24 DIAGNOSIS — C773 Secondary and unspecified malignant neoplasm of axilla and upper limb lymph nodes: Secondary | ICD-10-CM | POA: Diagnosis not present

## 2015-07-24 DIAGNOSIS — C50812 Malignant neoplasm of overlapping sites of left female breast: Secondary | ICD-10-CM

## 2015-07-24 DIAGNOSIS — C50911 Malignant neoplasm of unspecified site of right female breast: Secondary | ICD-10-CM

## 2015-07-24 DIAGNOSIS — C50912 Malignant neoplasm of unspecified site of left female breast: Secondary | ICD-10-CM

## 2015-07-24 DIAGNOSIS — D75839 Thrombocytosis, unspecified: Secondary | ICD-10-CM

## 2015-07-24 DIAGNOSIS — Z720 Tobacco use: Secondary | ICD-10-CM | POA: Diagnosis not present

## 2015-07-24 DIAGNOSIS — G8929 Other chronic pain: Secondary | ICD-10-CM

## 2015-07-24 DIAGNOSIS — D473 Essential (hemorrhagic) thrombocythemia: Secondary | ICD-10-CM | POA: Diagnosis not present

## 2015-07-24 LAB — CBC WITH DIFFERENTIAL/PLATELET
BASO%: 0.9 % (ref 0.0–2.0)
BASOS ABS: 0.1 10*3/uL (ref 0.0–0.1)
EOS%: 1.5 % (ref 0.0–7.0)
Eosinophils Absolute: 0.1 10*3/uL (ref 0.0–0.5)
HCT: 42.7 % (ref 34.8–46.6)
HGB: 14.1 g/dL (ref 11.6–15.9)
LYMPH%: 23 % (ref 14.0–49.7)
MCH: 28.6 pg (ref 25.1–34.0)
MCHC: 33.1 g/dL (ref 31.5–36.0)
MCV: 86.5 fL (ref 79.5–101.0)
MONO#: 0.6 10*3/uL (ref 0.1–0.9)
MONO%: 6.2 % (ref 0.0–14.0)
NEUT%: 68.4 % (ref 38.4–76.8)
NEUTROS ABS: 6.3 10*3/uL (ref 1.5–6.5)
Platelets: 561 10*3/uL — ABNORMAL HIGH (ref 145–400)
RBC: 4.94 10*6/uL (ref 3.70–5.45)
RDW: 15.1 % — AB (ref 11.2–14.5)
WBC: 9.2 10*3/uL (ref 3.9–10.3)
lymph#: 2.1 10*3/uL (ref 0.9–3.3)

## 2015-07-24 LAB — COMPREHENSIVE METABOLIC PANEL (CC13)
ALT: 20 U/L (ref 0–55)
ANION GAP: 8 meq/L (ref 3–11)
AST: 21 U/L (ref 5–34)
Albumin: 3.6 g/dL (ref 3.5–5.0)
Alkaline Phosphatase: 78 U/L (ref 40–150)
BILIRUBIN TOTAL: 0.33 mg/dL (ref 0.20–1.20)
BUN: 10.8 mg/dL (ref 7.0–26.0)
CALCIUM: 9.1 mg/dL (ref 8.4–10.4)
CO2: 26 meq/L (ref 22–29)
Chloride: 109 mEq/L (ref 98–109)
Creatinine: 0.9 mg/dL (ref 0.6–1.1)
EGFR: 82 mL/min/{1.73_m2} — AB (ref >90)
Glucose: 98 mg/dl (ref 70–140)
POTASSIUM: 3.2 meq/L — AB (ref 3.5–5.1)
SODIUM: 143 meq/L (ref 136–145)
Total Protein: 6.7 g/dL (ref 6.4–8.3)
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
## 2015-07-24 NOTE — Telephone Encounter (Signed)
Patient did not stop by scheduling,appointments have been made and avs mailed to patient

## 2015-07-26 ENCOUNTER — Inpatient Hospital Stay (HOSPITAL_COMMUNITY)
Admission: EM | Admit: 2015-07-26 | Discharge: 2015-07-29 | DRG: 603 | Disposition: A | Discharge status: Home / Self Care | Payer: Medicaid Other | Attending: Family Medicine | Admitting: Family Medicine

## 2015-07-26 ENCOUNTER — Encounter (HOSPITAL_COMMUNITY): Payer: Self-pay | Admitting: Emergency Medicine

## 2015-07-26 ENCOUNTER — Other Ambulatory Visit: Payer: Self-pay

## 2015-07-26 ENCOUNTER — Emergency Department (HOSPITAL_COMMUNITY): Payer: Medicaid Other

## 2015-07-26 DIAGNOSIS — I890 Lymphedema, not elsewhere classified: Secondary | ICD-10-CM | POA: Diagnosis present

## 2015-07-26 DIAGNOSIS — E785 Hyperlipidemia, unspecified: Secondary | ICD-10-CM | POA: Diagnosis present

## 2015-07-26 DIAGNOSIS — Z923 Personal history of irradiation: Secondary | ICD-10-CM

## 2015-07-26 DIAGNOSIS — Z9221 Personal history of antineoplastic chemotherapy: Secondary | ICD-10-CM

## 2015-07-26 DIAGNOSIS — G8929 Other chronic pain: Secondary | ICD-10-CM | POA: Diagnosis present

## 2015-07-26 DIAGNOSIS — I700 Atherosclerosis of aorta: Secondary | ICD-10-CM | POA: Diagnosis present

## 2015-07-26 DIAGNOSIS — E876 Hypokalemia: Secondary | ICD-10-CM | POA: Diagnosis present

## 2015-07-26 DIAGNOSIS — C50911 Malignant neoplasm of unspecified site of right female breast: Secondary | ICD-10-CM | POA: Diagnosis present

## 2015-07-26 DIAGNOSIS — Z79899 Other long term (current) drug therapy: Secondary | ICD-10-CM | POA: Diagnosis not present

## 2015-07-26 DIAGNOSIS — M7989 Other specified soft tissue disorders: Secondary | ICD-10-CM | POA: Diagnosis not present

## 2015-07-26 DIAGNOSIS — F17210 Nicotine dependence, cigarettes, uncomplicated: Secondary | ICD-10-CM | POA: Diagnosis present

## 2015-07-26 DIAGNOSIS — Z79810 Long term (current) use of selective estrogen receptor modulators (SERMs): Secondary | ICD-10-CM

## 2015-07-26 DIAGNOSIS — F1920 Other psychoactive substance dependence, uncomplicated: Secondary | ICD-10-CM

## 2015-07-26 DIAGNOSIS — L03114 Cellulitis of left upper limb: Secondary | ICD-10-CM | POA: Diagnosis present

## 2015-07-26 DIAGNOSIS — Z8249 Family history of ischemic heart disease and other diseases of the circulatory system: Secondary | ICD-10-CM | POA: Diagnosis not present

## 2015-07-26 DIAGNOSIS — Z7982 Long term (current) use of aspirin: Secondary | ICD-10-CM

## 2015-07-26 DIAGNOSIS — D75839 Thrombocytosis, unspecified: Secondary | ICD-10-CM | POA: Diagnosis present

## 2015-07-26 DIAGNOSIS — D473 Essential (hemorrhagic) thrombocythemia: Secondary | ICD-10-CM | POA: Diagnosis present

## 2015-07-26 DIAGNOSIS — I252 Old myocardial infarction: Secondary | ICD-10-CM | POA: Diagnosis not present

## 2015-07-26 DIAGNOSIS — L03119 Cellulitis of unspecified part of limb: Secondary | ICD-10-CM

## 2015-07-26 DIAGNOSIS — M1990 Unspecified osteoarthritis, unspecified site: Secondary | ICD-10-CM | POA: Diagnosis present

## 2015-07-26 DIAGNOSIS — Z79891 Long term (current) use of opiate analgesic: Secondary | ICD-10-CM | POA: Diagnosis not present

## 2015-07-26 DIAGNOSIS — Z791 Long term (current) use of non-steroidal anti-inflammatories (NSAID): Secondary | ICD-10-CM

## 2015-07-26 DIAGNOSIS — I25119 Atherosclerotic heart disease of native coronary artery with unspecified angina pectoris: Secondary | ICD-10-CM | POA: Diagnosis not present

## 2015-07-26 DIAGNOSIS — Z9013 Acquired absence of bilateral breasts and nipples: Secondary | ICD-10-CM | POA: Diagnosis present

## 2015-07-26 DIAGNOSIS — I2510 Atherosclerotic heart disease of native coronary artery without angina pectoris: Secondary | ICD-10-CM | POA: Diagnosis present

## 2015-07-26 DIAGNOSIS — C50811 Malignant neoplasm of overlapping sites of right female breast: Secondary | ICD-10-CM | POA: Diagnosis present

## 2015-07-26 DIAGNOSIS — I10 Essential (primary) hypertension: Secondary | ICD-10-CM | POA: Diagnosis present

## 2015-07-26 DIAGNOSIS — R079 Chest pain, unspecified: Secondary | ICD-10-CM | POA: Diagnosis present

## 2015-07-26 LAB — CBC
HCT: 45 % (ref 36.0–46.0)
HEMATOCRIT: 40.1 % (ref 36.0–46.0)
Hemoglobin: 13.9 g/dL (ref 12.0–15.0)
Hemoglobin: 15.5 g/dL — ABNORMAL HIGH (ref 12.0–15.0)
MCH: 29.1 pg (ref 26.0–34.0)
MCH: 29.2 pg (ref 26.0–34.0)
MCHC: 34.4 g/dL (ref 30.0–36.0)
MCHC: 34.7 g/dL (ref 30.0–36.0)
MCV: 84.1 fL (ref 78.0–100.0)
MCV: 84.9 fL (ref 78.0–100.0)
Platelets: 527 10*3/uL — ABNORMAL HIGH (ref 150–400)
Platelets: 553 10*3/uL — ABNORMAL HIGH (ref 150–400)
RBC: 4.77 MIL/uL (ref 3.87–5.11)
RBC: 5.3 MIL/uL — ABNORMAL HIGH (ref 3.87–5.11)
RDW: 15 % (ref 11.5–15.5)
RDW: 15.1 % (ref 11.5–15.5)
WBC: 21.1 10*3/uL — ABNORMAL HIGH (ref 4.0–10.5)
WBC: 22.2 10*3/uL — ABNORMAL HIGH (ref 4.0–10.5)

## 2015-07-26 LAB — BASIC METABOLIC PANEL
Anion gap: 10 (ref 5–15)
Anion gap: 12 (ref 5–15)
BUN: 12 mg/dL (ref 6–20)
BUN: 11 mg/dL (ref 6–20)
CHLORIDE: 102 mmol/L (ref 101–111)
CO2: 24 mmol/L (ref 22–32)
CO2: 23 mmol/L (ref 22–32)
CREATININE: 0.96 mg/dL (ref 0.44–1.00)
Calcium: 9.2 mg/dL (ref 8.9–10.3)
Calcium: 8.7 mg/dL — ABNORMAL LOW (ref 8.9–10.3)
Chloride: 103 mmol/L (ref 101–111)
Creatinine, Ser: 1.04 mg/dL — ABNORMAL HIGH (ref 0.44–1.00)
GFR calc Af Amer: >60 mL/min (ref >60)
GFR calc Af Amer: >60 mL/min (ref >60)
GFR calc non Af Amer: >60 mL/min (ref >60)
Glucose, Bld: 88 mg/dL (ref 65–99)
Glucose, Bld: 100 mg/dL — ABNORMAL HIGH (ref 65–99)
POTASSIUM: 2.8 mmol/L — AB (ref 3.5–5.1)
Potassium: 2.8 mmol/L — ABNORMAL LOW (ref 3.5–5.1)
SODIUM: 137 mmol/L (ref 135–145)
Sodium: 137 mmol/L (ref 135–145)

## 2015-07-26 LAB — I-STAT TROPONIN, ED: Troponin i, poc: 0.01 ng/mL (ref 0.00–0.08)

## 2015-07-26 LAB — I-STAT CG4 LACTIC ACID, ED: Lactic Acid, Venous: 3.06 mmol/L (ref 0.5–2.0)

## 2015-07-26 LAB — TROPONIN I
Troponin I: <0.03 ng/mL (ref <0.031)
Troponin I: <0.03 ng/mL (ref <0.031)

## 2015-07-26 LAB — MAGNESIUM: Magnesium: 1.6 mg/dL — ABNORMAL LOW (ref 1.7–2.4)

## 2015-07-26 MED ORDER — MAGNESIUM CHLORIDE 64 MG PO TBEC
3.0000 | DELAYED_RELEASE_TABLET | Freq: Every day | ORAL | Status: DC
  Administered 2015-07-26 – 2015-07-29 (×4): 192 mg via ORAL
  Filled 2015-07-26 (×4): qty 3

## 2015-07-26 MED ORDER — SODIUM CHLORIDE 0.9 % IV BOLUS (SEPSIS)
1000.0000 mL | Freq: Once | INTRAVENOUS | Status: AC
  Administered 2015-07-26: 1000 mL via INTRAVENOUS

## 2015-07-26 MED ORDER — SODIUM CHLORIDE 0.9 % IJ SOLN
3.0000 mL | Freq: Two times a day (BID) | INTRAMUSCULAR | Status: DC
  Administered 2015-07-26 – 2015-07-29 (×6): 3 mL via INTRAVENOUS

## 2015-07-26 MED ORDER — CLINDAMYCIN PHOSPHATE 600 MG/50ML IV SOLN
600.0000 mg | Freq: Once | INTRAVENOUS | Status: AC
  Administered 2015-07-26: 600 mg via INTRAVENOUS
  Filled 2015-07-26: qty 50

## 2015-07-26 MED ORDER — TRIAMTERENE-HCTZ 75-50 MG PO TABS
1.0000 | ORAL_TABLET | Freq: Every day | ORAL | Status: DC
  Administered 2015-07-26 – 2015-07-29 (×4): 1 via ORAL
  Filled 2015-07-26 (×4): qty 1

## 2015-07-26 MED ORDER — POTASSIUM CHLORIDE CRYS ER 20 MEQ PO TBCR
40.0000 meq | EXTENDED_RELEASE_TABLET | Freq: Every day | ORAL | Status: DC
  Administered 2015-07-26 – 2015-07-29 (×4): 40 meq via ORAL
  Filled 2015-07-26 (×4): qty 2

## 2015-07-26 MED ORDER — MAGNESIUM-ZINC 133.33-5 MG PO TABS: 1.0000 | ORAL_TABLET | Freq: Every day | ORAL | Status: DC

## 2015-07-26 MED ORDER — ONDANSETRON HCL 4 MG/2ML IJ SOLN: 4.0000 mg | Freq: Four times a day (QID) | INTRAMUSCULAR | Status: DC | PRN

## 2015-07-26 MED ORDER — ASPIRIN 325 MG PO TABS
325.0000 mg | ORAL_TABLET | Freq: Every day | ORAL | Status: DC
  Administered 2015-07-26 – 2015-07-29 (×4): 325 mg via ORAL
  Filled 2015-07-26 (×4): qty 1

## 2015-07-26 MED ORDER — ONDANSETRON HCL 4 MG/2ML IJ SOLN
4.0000 mg | Freq: Once | INTRAMUSCULAR | Status: AC
  Administered 2015-07-26: 4 mg via INTRAVENOUS
  Filled 2015-07-26: qty 2

## 2015-07-26 MED ORDER — ONDANSETRON HCL 4 MG PO TABS: 4.0000 mg | ORAL_TABLET | Freq: Four times a day (QID) | ORAL | Status: DC | PRN

## 2015-07-26 MED ORDER — HYDROMORPHONE HCL 1 MG/ML IJ SOLN
0.5000 mg | INTRAMUSCULAR | Status: DC | PRN
  Administered 2015-07-26 – 2015-07-27 (×5): 1 mg via INTRAVENOUS
  Filled 2015-07-26 (×6): qty 1

## 2015-07-26 MED ORDER — ENOXAPARIN SODIUM 40 MG/0.4ML ~~LOC~~ SOLN
40.0000 mg | SUBCUTANEOUS | Status: DC
  Administered 2015-07-26 – 2015-07-29 (×4): 40 mg via SUBCUTANEOUS
  Filled 2015-07-26 (×4): qty 0.4

## 2015-07-26 MED ORDER — OXYCODONE-ACETAMINOPHEN 5-325 MG PO TABS
1.0000 | ORAL_TABLET | ORAL | Status: DC | PRN
  Administered 2015-07-27 – 2015-07-29 (×11): 2 via ORAL
  Filled 2015-07-26 (×11): qty 2

## 2015-07-26 MED ORDER — ACETAMINOPHEN 650 MG RE SUPP: 650.0000 mg | Freq: Four times a day (QID) | RECTAL | Status: DC | PRN

## 2015-07-26 MED ORDER — METHADONE HCL 5 MG PO TABS: 15.0000 mg | ORAL_TABLET | Freq: Three times a day (TID) | ORAL | Status: DC | PRN

## 2015-07-26 MED ORDER — ACETAMINOPHEN 325 MG PO TABS
650.0000 mg | ORAL_TABLET | Freq: Four times a day (QID) | ORAL | Status: DC | PRN
Start: 2015-07-26 — End: 2015-07-26

## 2015-07-26 MED ORDER — MAGNESIUM LACTATE 84 MG (7MEQ) PO TBCR: 168.0000 mg | EXTENDED_RELEASE_TABLET | Freq: Every day | ORAL | Status: DC

## 2015-07-26 MED ORDER — NITROGLYCERIN 2 % TD OINT
0.5000 [in_us] | TOPICAL_OINTMENT | Freq: Four times a day (QID) | TRANSDERMAL | Status: DC
  Administered 2015-07-26 – 2015-07-27 (×7): 0.5 [in_us] via TOPICAL
  Filled 2015-07-26: qty 30

## 2015-07-26 MED ORDER — IBUPROFEN 600 MG PO TABS
600.0000 mg | ORAL_TABLET | Freq: Three times a day (TID) | ORAL | Status: DC
  Administered 2015-07-26 – 2015-07-29 (×8): 600 mg via ORAL
  Filled 2015-07-26 (×2): qty 1
  Filled 2015-07-26: qty 3
  Filled 2015-07-26 (×8): qty 1

## 2015-07-26 MED ORDER — ATORVASTATIN CALCIUM 20 MG PO TABS
20.0000 mg | ORAL_TABLET | Freq: Every day | ORAL | Status: DC
  Administered 2015-07-26 – 2015-07-28 (×3): 20 mg via ORAL
  Filled 2015-07-26 (×4): qty 1

## 2015-07-26 MED ORDER — MORPHINE SULFATE 4 MG/ML IJ SOLN
4.0000 mg | Freq: Once | INTRAMUSCULAR | Status: AC
  Administered 2015-07-26: 4 mg via INTRAVENOUS
  Filled 2015-07-26: qty 1

## 2015-07-26 MED ORDER — OXYCODONE HCL 5 MG PO TABS
5.0000 mg | ORAL_TABLET | ORAL | Status: DC | PRN
  Administered 2015-07-26: 5 mg via ORAL
  Filled 2015-07-26: qty 1

## 2015-07-26 MED ORDER — SODIUM CHLORIDE 0.9 % IV SOLN
INTRAVENOUS | Status: AC
  Administered 2015-07-26 (×2): via INTRAVENOUS

## 2015-07-26 MED ORDER — CLINDAMYCIN PHOSPHATE 600 MG/50ML IV SOLN
600.0000 mg | Freq: Three times a day (TID) | INTRAVENOUS | Status: DC
  Administered 2015-07-26 – 2015-07-27 (×5): 600 mg via INTRAVENOUS
  Filled 2015-07-26 (×7): qty 50

## 2015-07-26 MED ORDER — POTASSIUM CHLORIDE 10 MEQ/100ML IV SOLN
10.0000 meq | INTRAVENOUS | Status: AC
  Administered 2015-07-26 (×4): 10 meq via INTRAVENOUS
  Filled 2015-07-26 (×4): qty 100

## 2015-07-26 MED ORDER — ALLOPURINOL 300 MG PO TABS
300.0000 mg | ORAL_TABLET | Freq: Every day | ORAL | Status: DC
  Administered 2015-07-26 – 2015-07-29 (×4): 300 mg via ORAL
  Filled 2015-07-26 (×4): qty 1

## 2015-07-26 MED ORDER — ALUM & MAG HYDROXIDE-SIMETH 200-200-20 MG/5ML PO SUSP: 30.0000 mL | Freq: Four times a day (QID) | ORAL | Status: DC | PRN

## 2015-07-26 MED ORDER — METOPROLOL SUCCINATE ER 100 MG PO TB24
100.0000 mg | ORAL_TABLET | Freq: Every day | ORAL | Status: DC
  Administered 2015-07-26 – 2015-07-29 (×4): 100 mg via ORAL
  Filled 2015-07-26 (×4): qty 1

## 2015-07-26 MED ORDER — VITAMIN D (ERGOCALCIFEROL) 1.25 MG (50000 UNIT) PO CAPS
50000.0000 [IU] | ORAL_CAPSULE | Freq: Every morning | ORAL | Status: DC
  Administered 2015-07-26 – 2015-07-29 (×4): 50000 [IU] via ORAL
  Filled 2015-07-26 (×4): qty 1

## 2015-07-26 MED ORDER — ADULT MULTIVITAMIN W/MINERALS CH
1.0000 | ORAL_TABLET | Freq: Every day | ORAL | Status: DC
  Administered 2015-07-26 – 2015-07-29 (×4): 1 via ORAL
  Filled 2015-07-26 (×4): qty 1

## 2015-07-26 NOTE — Progress Notes (Signed)
[name] MOL:[id number] DOB: [birth year] DOA: 07/26/2015 PCP: Arnoldo Morale, MD  Brief narrative: 54 y/o ? 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 No animal or tick bite No other exposures   Objective    Interim History:   Telemetry:    Objective: Filed Vitals:   07/26/15 0142 07/26/15 0240 07/26/15 0651 07/26/15 0942  BP: 139/80 155/72 135/63 97/78  Pulse:  91 87 88  Temp:  100 F (37.8 C) 99.6 F (37.6 C) 98.2 F (36.8 C)  TempSrc:  Oral Oral Oral  Resp:  '20 20 20  ' Height:  '5\' 5"'  (1.651 m)    Weight:  106.777 kg (235 lb 6.4 oz)    SpO2:  97% 93% 95%    Intake/Output Summary (Last 24 hours) at 07/26/15 1131 Last data filed at 07/26/15 1117  Gross per 24 hour  Intake  597.5 ml  Output      0 ml  Net  597.5 ml    Exam:  General: eomi ncat Cardiovascular: s1 s 2no m/r/g Respiratory: clear no added sound Abdomen: soft, Nt Nd no rebound Skin no LE edema-significant rubor/calor to L Arm-Peau d'orange-no Abcess, NO LAN Neuro nad  Data Reviewed: Basic Metabolic Panel:  Recent Labs Lab 07/24/15 1320 07/26/15 0035 07/26/15 0305  NA 143 137 137  K 3.2* 2.8* 2.8*  CL  --  103 102  CO2 '26 24 23  ' GLUCOSE 98 88 100*  BUN 10.'8 12 11  ' CREATININE 0.9 1.04* 0.96  CALCIUM 9.1 9.2 8.7*  MG  --   --  1.6*   Liver Function Tests:  Recent Labs Lab 07/24/15 1320  AST 21  ALT 20  ALKPHOS 78  BILITOT 0.33    PROT 6.7  ALBUMIN 3.6   No results for input(s): LIPASE, AMYLASE in the last 168 hours. No results for input(s): AMMONIA in the last 168 hours. CBC:  Recent Labs Lab 07/24/15 1320 07/26/15 0035 07/26/15 0305  WBC 9.2 21.1* 22.2*  NEUTROABS 6.3  --   --   HGB 14.1 15.5* 13.9  HCT 42.7 45.0 40.1  MCV 86.5 84.9 84.1  PLT 561* 553* 527*   Cardiac Enzymes:  Recent Labs Lab 07/26/15 0305 07/26/15 0910  TROPONINI <0.03 <0.03   BNP: Invalid input(s): POCBNP CBG: No results for input(s): GLUCAP in the last 168 hours.  No results found for this or any previous visit (from the past 240 hour(s)).   Studies:              All Imaging reviewed and is as per above notation   Scheduled Meds: . allopurinol  300 mg Oral Daily  . aspirin  325 mg Oral Daily  . atorvastatin  20 mg Oral q1800  . clindamycin (CLEOCIN) IV  600 mg Intravenous 3 times per day  . enoxaparin (LOVENOX) injection  40 mg Subcutaneous Q24H  . metoprolol succinate  100 mg Oral Daily  .  multivitamin with minerals  1 tablet Oral Daily  . nitroGLYCERIN  0.5 inch Topical 4 times per day  . potassium chloride SA  40 mEq Oral Daily  . sodium chloride  3 mL Intravenous Q12H  . triamterene-hydrochlorothiazide  1 tablet Oral Daily  . Vitamin D (Ergocalciferol)  50,000 Units Oral q morning - 10a   Continuous Infusions: . sodium chloride 75 mL/hr at 07/26/15 1126     Assessment/Plan:  Principal Problem:   Cellulitis of arm, left with h/o Breeast cacner s/p bilat mastectomies Patient will need IV antibiotics with IV clindamycin at least 24-48 hours to document resolution I will add in addition to this potentially Keflex for group B strep coverage as well if she does not get better in the next 24 hours She should elevate the arm above her head as best possible I have low suspicion to think that there is any venous stasis phenomenon in her arm  As pyrexia + WBc etc etc At this time hold off on US doppler  UE Ibuprofen 1st choice for pain PErcocet 2nd choice Try not to use short acting Dilaudid contionue Tamoxifen 20 qd  Active Problems:   CAD (coronary artery disease) c last cardiac cath 07/09/13=non obst CAD, preserved LVEF Continue Metoprolol 100 daily XL Continue ASA 325 daily [dual indication-see below]    Dyslipidemia    Chronic pain Needs to continue Methadone 15 mg tid See above    Hypokalemia Magnesium 1.2 Replace slo-Mag  Replace K c  kdur 40 daily and d/c IV K     Breast cancer, right breast -will alert her oncologist to her being here    Thrombocytosis -ASA 325 Consideration for Anagrelide at some point per Oncologist   Appt with PCP:not yet Code Status: Full code Family Communication: no family + Disposition Plan: home? DVT prophylaxis: lovenox Consultants: none  Verneita Griffes, MD  [facility] Pager [phone] 07/26/2015, 11:31 AM    LOS: 0 days

## 2015-07-26 NOTE — Progress Notes (Signed)
Lymphedema sleeve ( provided by patient) placed on pt. Glove placed on as well.

## 2015-07-26 NOTE — Progress Notes (Signed)
PT Cancellation Note  Patient Details Name: OLETTA BUEHRING MRN: [id number] DOB: [birth year]   Cancelled Treatment:    Reason Eval/Treat Not Completed: Patient declined, stating she felt dizzy from the medicine, but she would like to try tomorrow.  Nursing did state they had ambulated with pt to bathroom earlier.  Will check back tomorrow to complete evaluation.   Takuya Lariccia LUBECK 07/26/2015, 2:29 PM
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
## 2015-07-26 NOTE — Plan of Care (Signed)
Problem: Phase I Progression Outcomes Goal: Wound assessment- dressing change as appropriate Outcome: Completed/Met Date Met:  07/26/15 No dressing

## 2015-07-26 NOTE — ED Notes (Signed)
One unsuccessful attempt a lab collection. Patent IV site  Present. Bre at bedside to draw labs however, pt needed to use restroom first.

## 2015-07-26 NOTE — ED Notes (Signed)
Patient transported to X-ray 

## 2015-07-26 NOTE — ED Notes (Signed)
Pt from home c/o central chest pain and shortness of breath since 1900. HX of MI.  Pt has redness and swelling to left arm from lymphedema. Pt took 325 mg ASA
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
## 2015-07-26 NOTE — ED Notes (Signed)
Pt's left arm is swollen and red, states very painful, history of lymphoma edema

## 2015-07-27 LAB — BASIC METABOLIC PANEL
ANION GAP: 5 (ref 5–15)
BUN: 10 mg/dL (ref 6–20)
CHLORIDE: 109 mmol/L (ref 101–111)
CO2: 23 mmol/L (ref 22–32)
CREATININE: 0.98 mg/dL (ref 0.44–1.00)
Calcium: 8.2 mg/dL — ABNORMAL LOW (ref 8.9–10.3)
GFR calc Af Amer: >60 mL/min (ref >60)
GFR calc non Af Amer: >60 mL/min (ref >60)
GLUCOSE: 102 mg/dL — AB (ref 65–99)
Potassium: 3.5 mmol/L (ref 3.5–5.1)
SODIUM: 137 mmol/L (ref 135–145)

## 2015-07-27 LAB — CBC WITH DIFFERENTIAL/PLATELET
Basophils Absolute: 0 10*3/uL (ref 0.0–0.1)
Basophils Relative: 0 % (ref 0–1)
EOS PCT: 1 % (ref 0–5)
Eosinophils Absolute: 0.2 10*3/uL (ref 0.0–0.7)
HEMATOCRIT: 39.4 % (ref 36.0–46.0)
Hemoglobin: 13.1 g/dL (ref 12.0–15.0)
LYMPHS ABS: 1.6 10*3/uL (ref 0.7–4.0)
LYMPHS PCT: 11 % — AB (ref 12–46)
MCH: 28.7 pg (ref 26.0–34.0)
MCHC: 33.2 g/dL (ref 30.0–36.0)
MCV: 86.2 fL (ref 78.0–100.0)
Monocytes Absolute: 0.6 10*3/uL (ref 0.1–1.0)
Monocytes Relative: 4 % (ref 3–12)
Neutro Abs: 12.7 10*3/uL — ABNORMAL HIGH (ref 1.7–7.7)
Neutrophils Relative %: 84 % — ABNORMAL HIGH (ref 43–77)
PLATELETS: 512 10*3/uL — AB (ref 150–400)
RBC: 4.57 MIL/uL (ref 3.87–5.11)
RDW: 15.6 % — ABNORMAL HIGH (ref 11.5–15.5)
WBC: 15.1 10*3/uL — ABNORMAL HIGH (ref 4.0–10.5)

## 2015-07-27 MED ORDER — CLINDAMYCIN HCL 300 MG PO CAPS
300.0000 mg | ORAL_CAPSULE | Freq: Three times a day (TID) | ORAL | Status: DC
  Administered 2015-07-27 – 2015-07-29 (×6): 300 mg via ORAL
  Filled 2015-07-27 (×6): qty 1

## 2015-07-27 NOTE — Progress Notes (Signed)
[name] TJQ:[id number] DOB: June 26, 1961 DOA: 07/26/2015 PCP: Arnoldo Morale, MD  Brief narrative: 54 y/o ? 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, smoker  Past medical history-As per Problem list Chart reviewed as below-   Consultants:  non  Procedures:    Antibiotics:  CLindamycin   Subjective   Pain a little better Wearing sleeve and elevating it Has a lymphedema machine at home No fever nor chills   Objective    Interim History:   Telemetry:    Objective: Filed Vitals:   07/27/15 0123 07/27/15 0539 07/27/15 0952 07/27/15 1319  BP: 119/68 135/70 104/71 116/69  Pulse: 82 71 78 82  Temp: 98.5 F (36.9 C) 98.6 F (37 C)  98.4 F (36.9 C)  TempSrc: Oral Oral  Oral  Resp: '20 20  20  ' Height:      Weight:      SpO2: 92% 96%  96%    Intake/Output Summary (Last 24 hours) at 07/27/15 1452 Last data filed at 07/27/15 1320  Gross per 24 hour  Intake 2858.75 ml  Output   2000 ml  Net 858.75 ml    Exam:  General: eomi ncat Cardiovascular: s1 s 2no m/r/g Respiratory: clear no added sound Abdomen: soft, Nt Nd no rebound Skin no LE edema-significant rubor/calor to L Arm-Peau d'orange-no Abcess, NO LAN Neuro nad  Data Reviewed: Basic Metabolic Panel:  Recent Labs Lab 07/24/15 1320  07/26/15 0035 07/26/15 0305 07/27/15 0942  NA 143  --  137 137 137  K 3.2*  < > 2.8* 2.8* 3.5  CL  --   --  103 102 109  CO2 26  --  '24 23 23  ' GLUCOSE 98  --  88 100* 102*  BUN 10.8  --  '12 11 10  ' CREATININE 0.9  --  1.04* 0.96 0.98  CALCIUM 9.1  --  9.2 8.7* 8.2*  MG  --   --   --  1.6*  --   < > = values in this interval not displayed. Liver Function  Tests:  Recent Labs Lab 07/24/15 1320  AST 21  ALT 20  ALKPHOS 78  BILITOT 0.33  PROT 6.7  ALBUMIN 3.6   No results for input(s): LIPASE, AMYLASE in the last 168 hours. No results for input(s): AMMONIA in the last 168 hours. CBC:  Recent Labs Lab 07/24/15 1320 07/26/15 0035 07/26/15 0305 07/27/15 0942  WBC 9.2 21.1* 22.2* 15.1*  NEUTROABS 6.3  --   --  12.7*  HGB 14.1 15.5* 13.9 13.1  HCT 42.7 45.0 40.1 39.4  MCV 86.5 84.9 84.1 86.2  PLT 561* 553* 527* 512*   Cardiac Enzymes:  Recent Labs Lab 07/26/15 0305 07/26/15 0910 07/26/15 1514  TROPONINI <0.03 <0.03 <0.03   BNP: Invalid input(s): POCBNP CBG: No results for input(s): GLUCAP in the last 168 hours.  Recent Results (from the past 240 hour(s))  Blood culture (routine x 2)     Status: None (Preliminary result)   Collection Time: 07/26/15  1:12 AM  Result Value Ref Range Status   Specimen Description BLOOD RIGHT HAND  Final   Special Requests BOTTLES DRAWN AEROBIC ONLY 3ML  Final   Culture   Final  NO GROWTH 1 DAY Performed at [facility]    Report Status PENDING  Incomplete  Blood culture (routine x 2)     Status: None (Preliminary result)   Collection Time: 07/26/15  1:25 AM  Result Value Ref Range Status   Specimen Description BLOOD BLOOD RIGHT FOREARM  Final   Special Requests BOTTLES DRAWN AEROBIC AND ANAEROBIC 5ML AND 3ML  Final   Culture   Final    NO GROWTH 1 DAY Performed at [facility]    Report Status PENDING  Incomplete     Studies:              All Imaging reviewed and is as per above notation   Scheduled Meds: . allopurinol  300 mg Oral Daily  . aspirin  325 mg Oral Daily  . atorvastatin  20 mg Oral q1800  . clindamycin (CLEOCIN) IV  600 mg Intravenous 3 times per day  . enoxaparin (LOVENOX) injection  40 mg Subcutaneous Q24H  . ibuprofen  600 mg Oral TID  . magnesium chloride  3 tablet Oral Daily  . metoprolol succinate  100 mg Oral Daily  . multivitamin  with minerals  1 tablet Oral Daily  . nitroGLYCERIN  0.5 inch Topical 4 times per day  . potassium chloride SA  40 mEq Oral Daily  . sodium chloride  3 mL Intravenous Q12H  . triamterene-hydrochlorothiazide  1 tablet Oral Daily  . Vitamin D (Ergocalciferol)  50,000 Units Oral q morning - 10a   Continuous Infusions:     Assessment/Plan:  Principal Problem:   Cellulitis of arm, left with h/o Breeast cacner s/p bilat mastectomies IV antibiotics with IV clindamycin -->PO clinda 8/7 elevate the arm above her head as best possible I have low suspicion to think that there is any venous stasis phenomenon in her arm  As pyrexia + WBc etc etc Ibuprofen 1st choice for pain PErcocet 2nd choice Try not to use short acting Dilaudid continue Tamoxifen 20 qd  Active Problems:   CAD (coronary artery disease) c last cardiac cath 07/09/13=non obst CAD, preserved LVEF Continue Metoprolol 100 daily XL Continue ASA 325 daily [dual indication-see below]    Chronic pain Needs to continue Methadone 15 mg tid See above    Hypokalemia-resolved 8/7 Magnesium 1.2 Replace slo-Mag -rechekc am Replace K c  kdur 40 daily and d/c IV K    Breast cancer, right breast -will alert her oncologist to her being here    Thrombocytosis -ASA 325 Consideration for Anagrelide at some point per Oncologist   Appt with PCP:not yet Code Status: Full code Family Communication: no family + Disposition Plan: home 24-48 horus DVT prophylaxis: lovenox Consultants: none  Verneita Griffes, MD  [facility] Pager [phone] 07/27/2015, 2:52 PM    LOS: 1 day

## 2015-07-27 NOTE — Evaluation (Signed)
Physical Therapy Evaluation and discharge Patient Details Name: [name] MRN: [id number] DOB: 1961/01/31 Today's Date: 07/27/2015   History of Present Illness  Pt with hx of breast CA and L UE lymphedema admitted with L UE cellulitis   Clinical Impression  Pt with initial PT orders for lymphedema management, however lymphedema therapy cannot be performed with pt til active infection is cleared.  If she still needs lymphedema treatment at that time, then recommend outpatient PT.  Pt was concerned about her ability to climb steps as well and MD did write order for mobility assessment.  Pt was able to manage steps with step through and step to pattern with 2 rails and 2/4 dyspnea with o2 97% and MOD I with all mobility.  Pt encouraged to ambulate the hallways and will d/c from PT at this time due to no acute skilled PT needs.     Follow Up Recommendations Outpatient PT;Other (comment) (lymphedema treatment once infection clears)    Equipment Recommendations  None recommended by PT    Recommendations for Other Services       Precautions / Restrictions Precautions Precaution Comments: L UE lymphedema Restrictions Weight Bearing Restrictions: No      Mobility  Bed Mobility Overal bed mobility: Independent                Transfers Overall transfer level: Independent                  Ambulation/Gait Ambulation/Gait assistance: Independent Ambulation Distance (Feet): 480 Feet Assistive device: None Gait Pattern/deviations: Step-through pattern     General Gait Details: 2/4 dyspnea with o2 97% on RA  Stairs Stairs: Yes Stairs assistance: Modified independent (Device/Increase time) Stair Management: Two rails;Step to pattern;Alternating pattern Number of Stairs: 20 General stair comments: Step through pattern with ascent and step to pattern with descent  Wheelchair Mobility    Modified Rankin (Stroke Patients Only)       Balance Overall balance  assessment: Independent                                           Pertinent Vitals/Pain Pain Assessment: 0-10 Pain Score: 3  Pain Location: L arm Pain Descriptors / Indicators: Aching;Burning Pain Intervention(s): Premedicated before session;Repositioned    Home Living Family/patient expects to be discharged to:: Private residence Living Arrangements: Spouse/significant other Available Help at Discharge: Family;Available PRN/intermittently Type of Home: Apartment Home Access: Stairs to enter Entrance Stairs-Rails: Chemical engineer of Steps: 12 Home Layout: One level Home Equipment: Cane - single point      Prior Function Level of Independence: Independent with assistive device(s)         Comments: Amb with cane at times     Hand Dominance   Dominant Hand: Right    Extremity/Trunk Assessment   Upper Extremity Assessment: LUE deficits/detail;Overall WFL for tasks assessed       LUE Deficits / Details: L UE lymphedema with sleeve   Lower Extremity Assessment: Overall WFL for tasks assessed      Cervical / Trunk Assessment: Normal  Communication   Communication: No difficulties  Cognition Arousal/Alertness: Awake/alert Behavior During Therapy: WFL for tasks assessed/performed Overall Cognitive Status: Within Functional Limits for tasks assessed                      General Comments  Exercises        Assessment/Plan    PT Assessment Patent does not need any further PT services  PT Diagnosis Other (comment) (lymphedema)   PT Problem List    PT Treatment Interventions     PT Goals (Current goals can be found in the Care Plan section) Acute Rehab PT Goals Patient Stated Goal: to quit smoking PT Goal Formulation: All assessment and education complete, DC therapy    Frequency     Barriers to discharge        Co-evaluation               End of Session   Activity Tolerance: Patient tolerated  treatment well Patient left: Other (comment) (in bathroom) Nurse Communication: Mobility status         Time: 7591-6384 PT Time Calculation (min) (ACUTE ONLY): 15 min   Charges:         PT G Codes:        Eian Vandervelden LUBECK 07/27/2015, 5:01 PM

## 2015-07-27 NOTE — Progress Notes (Signed)
Utilization review completed.  

## 2015-07-28 LAB — CBC WITH DIFFERENTIAL/PLATELET
BASOS ABS: 0 10*3/uL (ref 0.0–0.1)
Basophils Relative: 0 % (ref 0–1)
Eosinophils Absolute: 0.3 10*3/uL (ref 0.0–0.7)
Eosinophils Relative: 2 % (ref 0–5)
HCT: 40.6 % (ref 36.0–46.0)
Hemoglobin: 13.4 g/dL (ref 12.0–15.0)
LYMPHS ABS: 2.5 10*3/uL (ref 0.7–4.0)
Lymphocytes Relative: 19 % (ref 12–46)
MCH: 28.6 pg (ref 26.0–34.0)
MCHC: 33 g/dL (ref 30.0–36.0)
MCV: 86.6 fL (ref 78.0–100.0)
Monocytes Absolute: 0.7 10*3/uL (ref 0.1–1.0)
Monocytes Relative: 5 % (ref 3–12)
NEUTROS ABS: 9.8 10*3/uL — AB (ref 1.7–7.7)
NEUTROS PCT: 74 % (ref 43–77)
Platelets: 529 10*3/uL — ABNORMAL HIGH (ref 150–400)
RBC: 4.69 MIL/uL (ref 3.87–5.11)
RDW: 15.5 % (ref 11.5–15.5)
WBC: 13.3 10*3/uL — ABNORMAL HIGH (ref 4.0–10.5)

## 2015-07-28 NOTE — Care Management Note (Signed)
Case Management Note  Patient Details  Name: [name] MRN: [id number] Date of Birth: [birth year]  Subjective/Objective:   54 y/o f admitted w/L arm cellulitis. From home.PT-recc otpt PT-lymphedema.Will need manual script for otpt PT.                 Action/Plan:d/c plan home. No further d/c needs.   Expected Discharge Date:                 Expected Discharge Plan:  Home/Self Care  In-House Referral:     Discharge planning Services  CM Consult  Post Acute Care Choice:    Choice offered to:     DME Arranged:    DME Agency:     HH Arranged:    HH Agency:     Status of Service:  In process, will continue to follow  Medicare Important Message Given:    Date Medicare IM Given:    Medicare IM give by:    Date Additional Medicare IM Given:    Additional Medicare Important Message give by:     If discussed at [place] of Stay Meetings, dates discussed:    Additional Comments:  Dessa Phi, RN 07/28/2015, 1:44 PM

## 2015-07-28 NOTE — Progress Notes (Signed)
[name] DZH:[id number] DOB: [birth year] DOA: 07/26/2015 PCP: Arnoldo Morale, MD  Brief narrative: 54 y/o ? 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, smoker  Past medical history-As per Problem list Chart reviewed as below-   Consultants:  non  Procedures:    Antibiotics:  CLindamycin   Subjective      Objective    Interim History:   Telemetry:    Objective: Filed Vitals:   07/28/15 0446 07/28/15 0941 07/28/15 0956 07/28/15 1328  BP: 119/71 120/72 114/57 147/78  Pulse: 62 65 73 73  Temp: 98.3 F (36.8 C)  98.1 F (36.7 C) 98.7 F (37.1 C)  TempSrc: Oral  Oral Oral  Resp: _0 Height:      Weight:      SpO2: 98%  97% 97%    Intake/Output Summary (Last 24 hours) at 07/28/15 1422 Last data filed at 07/28/15 1334  Gross per 24 hour  Intake   2860 ml  Output    650 ml  Net   2210 ml    Exam:  General: eomi ncat Cardiovascular: s1 s 2no m/r/g Respiratory: clear no added sound Abdomen: soft, Nt Nd no rebound Skin no LE edema-significant rubor/calor to L Arm-Peau d'orange-no Abcess, NO LAN Neuro nad  Data Reviewed: Basic Metabolic Panel:  Recent Labs Lab 07/24/15 1320  07/26/15 0035 07/26/15 0305 07/27/15 0942  NA 143  --  137 137 137  K 3.2*  < > 2.8* 2.8* 3.5  CL  --   --  103 102 109  CO2 26  --  _1 GLUCOSE 98  --  88 100* 102*  BUN 10.8  --  _2 CREATININE 0.9  --  1.04* 0.96 0.98  CALCIUM 9.1  --  9.2 8.7* 8.2*  MG  --   --   --  1.6*  --   < > = values in this interval not displayed. Liver Function Tests:  Recent Labs Lab 07/24/15 1320  AST 21  ALT 20  ALKPHOS 78  BILITOT 0.33  PROT 6.7  ALBUMIN 3.6   No  results for input(s): LIPASE, AMYLASE in the last 168 hours. No results for input(s): AMMONIA in the last 168 hours. CBC:  Recent Labs Lab 07/24/15 1320 07/26/15 0035 07/26/15 0305 07/27/15 0942 07/28/15 0730  WBC 9.2 21.1* 22.2* 15.1* 13.3*  NEUTROABS 6.3  --   --  12.7* 9.8*  HGB 14.1 15.5* 13.9 13.1 13.4  HCT 42.7 45.0 40.1 39.4 40.6  MCV 86.5 84.9 84.1 86.2 86.6  PLT 561* 553* 527* 512* 529*   Cardiac Enzymes:  Recent Labs Lab 07/26/15 0305 07/26/15 0910 07/26/15 1514  TROPONINI <0.03 <0.03 <0.03   BNP: Invalid input(s): POCBNP CBG: No results for input(s): GLUCAP in the last 168 hours.  Recent Results (from the past 240 hour(s))  Blood culture (routine x 2)     Status: None (Preliminary result)   Collection Time: 07/26/15  1:12 AM  Result Value Ref Range Status   Specimen Description BLOOD RIGHT HAND  Final   Special Requests BOTTLES DRAWN AEROBIC ONLY 3ML  Final   Culture   Final    NO GROWTH  1 DAY Performed at [facility]    Report Status PENDING  Incomplete  Blood culture (routine x 2)     Status: None (Preliminary result)   Collection Time: 07/26/15  1:25 AM  Result Value Ref Range Status   Specimen Description BLOOD BLOOD RIGHT FOREARM  Final   Special Requests BOTTLES DRAWN AEROBIC AND ANAEROBIC 5ML AND 3ML  Final   Culture   Final    NO GROWTH 1 DAY Performed at [facility]    Report Status PENDING  Incomplete     Studies:              All Imaging reviewed and is as per above notation   Scheduled Meds: . allopurinol  300 mg Oral Daily  . aspirin  325 mg Oral Daily  . atorvastatin  20 mg Oral q1800  . clindamycin  300 mg Oral 3 times per day  . enoxaparin (LOVENOX) injection  40 mg Subcutaneous Q24H  . ibuprofen  600 mg Oral TID  . magnesium chloride  3 tablet Oral Daily  . metoprolol succinate  100 mg Oral Daily  . multivitamin with minerals  1 tablet Oral Daily  . potassium chloride SA  40 mEq Oral Daily  . sodium  chloride  3 mL Intravenous Q12H  . triamterene-hydrochlorothiazide  1 tablet Oral Daily  . Vitamin D (Ergocalciferol)  50,000 Units Oral q morning - 10a   Continuous Infusions:     Assessment/Plan:   Cellulitis of arm, left with h/o Breeast cancer s/p bilat mastectomies IV antibiotics with IV clindamycin -->PO clinda 8/7 elevate the arm above head Ibuprofen 1st choice for pain PErcocet 2nd choice Try not to use short acting Dilaudid continue Tamoxifen 20 qd Cbc + diff am  Active Problems:   CAD (coronary artery disease) c last cardiac cath 07/09/13=non obst CAD, preserved LVEF Continue Metoprolol 100 daily XL Continue ASA 325 daily [dual indication-see below]    Chronic pain Needs to continue Methadone 15 mg tid on d/c home Will give 2-3 days short acting opiate meds on d/c home See above    Hypokalemia-resolved 8/7 Magnesium 1.2 Replace slo-Mag -rechekc am Replace K c  kdur 40 daily and d/c IV K    Breast cancer, right breast -will alert her oncologist to her being here    Thrombocytosis -ASA 325 Consideration for Anagrelide at some point per Oncologist   Appt with PCP: scheduling  Code Status: Full code Family Communication: no family + Disposition Plan: home 24  DVT prophylaxis: lovenox Consultants: none  Verneita Griffes, MD  [facility] Pager [phone] 07/28/2015, 2:22 PM    LOS: 2 days

## 2015-07-29 LAB — CBC WITH DIFFERENTIAL/PLATELET
BASOS ABS: 0 10*3/uL (ref 0.0–0.1)
Basophils Relative: 0 % (ref 0–1)
Eosinophils Absolute: 0.2 10*3/uL (ref 0.0–0.7)
Eosinophils Relative: 2 % (ref 0–5)
HCT: 39.6 % (ref 36.0–46.0)
HEMOGLOBIN: 13.3 g/dL (ref 12.0–15.0)
Lymphocytes Relative: 20 % (ref 12–46)
Lymphs Abs: 1.9 10*3/uL (ref 0.7–4.0)
MCH: 28.7 pg (ref 26.0–34.0)
MCHC: 33.6 g/dL (ref 30.0–36.0)
MCV: 85.5 fL (ref 78.0–100.0)
MONOS PCT: 6 % (ref 3–12)
Monocytes Absolute: 0.6 10*3/uL (ref 0.1–1.0)
NEUTROS PCT: 72 % (ref 43–77)
Neutro Abs: 6.8 10*3/uL (ref 1.7–7.7)
Platelets: 528 10*3/uL — ABNORMAL HIGH (ref 150–400)
RBC: 4.63 MIL/uL (ref 3.87–5.11)
RDW: 15.5 % (ref 11.5–15.5)
WBC: 9.6 10*3/uL (ref 4.0–10.5)

## 2015-07-29 MED ORDER — OXYCODONE HCL 5 MG PO TABS: 5.0000 mg | ORAL_TABLET | ORAL | Status: DC | PRN

## 2015-07-29 MED ORDER — CLINDAMYCIN HCL 300 MG PO CAPS: 300.0000 mg | ORAL_CAPSULE | Freq: Three times a day (TID) | ORAL | Status: DC

## 2015-07-29 MED ORDER — MAGNESIUM CHLORIDE 64 MG PO TBEC: 3.0000 | DELAYED_RELEASE_TABLET | Freq: Every day | ORAL | Status: DC

## 2015-07-29 NOTE — Care Management Note (Signed)
Case Management Note  Patient Details  Name: [name] MRN: [id number] Date of Birth: [birth year]  Subjective/Objective:                    Action/Plan:d/c home no further d/c needs.   Expected Discharge Date:                 Expected Discharge Plan:  Home/Self Care  In-House Referral:     Discharge planning Services  CM Consult (ptpt PT-resource list given.)  Post Acute Care Choice:    Choice offered to:     DME Arranged:    DME Agency:     HH Arranged:    HH Agency:     Status of Service:  Completed, signed off  Medicare Important Message Given:    Date Medicare IM Given:    Medicare IM give by:    Date Additional Medicare IM Given:    Additional Medicare Important Message give by:     If discussed at [place] of Stay Meetings, dates discussed:    Additional Comments:  Dessa Phi, RN 07/29/2015, 10:21 AM

## 2015-07-29 NOTE — Discharge Summary (Signed)
Physician Discharge Summary  [name] [id number]:[id number] DOB: 09/21/1961 DOA: 07/26/2015  PCP: Arnoldo Morale, MD  Admit date: 07/26/2015 Discharge date: 07/29/2015  Time spent: 35 minutes  Recommendations for Outpatient Follow-up:  1. Get cbc + cmet 1 week 2. NO LYMPHEDEMA therapy till area on arm completely healed  3. Consideration for ASA 325 /anagrelide or maybe hydrea [?] as OP dependant on PLT counts as OP-Dr. [name] already aware 4. HH PT ordered 5. Smoking cessation to be continually and gently recommended as OP 6. Please recheck magnesium/cmet in 1 week 7. Consider A1c as OP-sugars low 100's here   Discharge Diagnoses:  Principal Problem:   Cellulitis of arm, left Active Problems:   CAD (coronary artery disease)   Dyslipidemia   Chronic pain   Hypokalemia   Chest pain   Lymphedema of arm   Breast cancer, right breast   Thrombocytosis   Discharge Condition: fair  Diet recommendation: hh low salt  Filed Weights   07/26/15 0240  Weight: 106.777 kg (235 lb 6.4 oz)    History of present illness:   Cellulitis of arm, left with h/o Breeast cancer s/p bilat mastectomies IV antibiotics with IV clindamycin -->PO clinda 8/7 elevate the arm above head Ibuprofen 1st choice for pain PErcocet 2nd choice Try not to use short acting Dilaudid continue Tamoxifen 20 qd Cbc + diff am  Active Problems:   CAD (coronary artery disease) c last cardiac cath 07/09/13=non obst CAD, preserved LVEF Continue Metoprolol 100 daily XL Continue ASA 325 daily [dual indication-see below]    Chronic pain Needs to continue Methadone 15 mg tid on d/c home-patient has enough on discharge to last till next OV See above    Hypokalemia-resolved 8/7 Magnesium 1.2 Replace slo-Mag-Rx on d/c home Replace K c  kdur 40 daily  As op Was given IV replacement while in hospital    Breast cancer, right breast -will alert her oncologist to her being here    Thrombocytosis -ASA  325 Consideration for Anagrelide at some point per Oncologist   Discharge Exam: Filed Vitals:   07/29/15 0439  BP: 131/82  Pulse: 70  Temp: 98 F (36.7 C)  Resp: 18    General: eomi ncat cheerful Promising to quit smoking No cp No n/v tol diet Cardiovascular:  s1 s 2no m/r/g Respiratory:  clear no added sound  Discharge Instructions   Discharge Instructions    Diet - low sodium heart healthy    Complete by:  As directed      Discharge instructions    Complete by:  As directed   complete entire course of clindamycin Re-start your methadone at home Get some blood-work done within 1 week We will ask therapy to evalaute you after your arm pain is resolved--do not start the lymphedema Rx until the infection is completely improved Get lab work done at either primary MD office or Dr. [name] office 1 week     Increase activity slowly    Complete by:  As directed           Current Discharge Medication List    START taking these medications   Details  clindamycin (CLEOCIN) 300 MG capsule Take 1 capsule (300 mg total) by mouth every 8 (eight) hours. Qty: 21 capsule, Refills: 0    magnesium chloride (SLOW-MAG) 64 MG TBEC SR tablet Take 3 tablets (192 mg total) by mouth daily. Qty: 60 tablet, Refills: 0    oxyCODONE (OXY IR/ROXICODONE) 5 MG immediate release tablet Take  1 tablet (5 mg total) by mouth every 4 (four) hours as needed for moderate pain. Qty: 30 tablet, Refills: 0      CONTINUE these medications which have NOT CHANGED   Details  allopurinol (ZYLOPRIM) 300 MG tablet Take 1 tablet (300 mg total) by mouth daily.    aspirin 325 MG tablet Take 325 mg by mouth daily.      atorvastatin (LIPITOR) 20 MG tablet Take 1 tablet (20 mg total) by mouth daily. Qty: 30 tablet, Refills: 12    Cholecalciferol (VITAMIN D3) 5000 UNITS CAPS USE 1 CAPSULE ONCE DAILY Refills: 1    ibuprofen (ADVIL,MOTRIN) 200 MG tablet Take 400 mg by mouth every 6 (six) hours as needed  (for pain.).    isosorbide mononitrate (IMDUR) 60 MG 24 hr tablet Take 1 tablet (60 mg total) by mouth every morning. Qty: 30 tablet, Refills: 1   Associated Diagnoses: Essential hypertension; Chronic pain; History of breast cancer; Neuropathic pain    MAGNESIUM-ZINC PO Take 1 tablet by mouth daily.     methadone (DOLOPHINE) 5 MG tablet Take 3 tablets (15 mg total) by mouth every 8 (eight) hours. Qty: 270 tablet, Refills: 0   Associated Diagnoses: Chronic pain; History of breast cancer; Neuropathic pain; Essential hypertension    metoprolol succinate (TOPROL-XL) 100 MG 24 hr tablet Take 1 tablet (100 mg total) by mouth daily. Take with or immediately following a meal. Qty: 30 tablet, Refills: 1   Associated Diagnoses: Essential hypertension; Chronic pain; History of breast cancer; Neuropathic pain    Multiple Vitamin (MULTIVITAMIN WITH MINERALS) TABS Take 1 tablet by mouth daily.    potassium chloride SA (K-DUR,KLOR-CON) 20 MEQ tablet Take 2 tablets (40 mEq total) by mouth daily. Qty: 60 tablet, Refills: 4   Associated Diagnoses: Cancer of breast, unspecified laterality; Hypokalemia    tamoxifen (NOLVADEX) 20 MG tablet TAKE 1 TABLET (20 MG TOTAL) BY MOUTH DAILY. Qty: 30 tablet, Refills: 4    triamterene-hydrochlorothiazide (MAXZIDE) 75-50 MG per tablet Take 1 tablet by mouth daily. Refills: 3       No Known Allergies    The results of significant diagnostics from this hospitalization (including imaging, microbiology, ancillary and laboratory) are listed below for reference.    Significant Diagnostic Studies: Dg Chest 2 View  07/26/2015   CLINICAL DATA:  Left-sided chest pain and left upper extremity swelling, onset this morning. Some dyspnea.  EXAM: CHEST  2 VIEW  COMPARISON:  09/02/2013  FINDINGS: There is mild hyperinflation and cardiomegaly. Slight central vascular congestion. No alveolar consolidation. No effusion.  IMPRESSION: Minimal central vascular congestion. Mild  cardiomegaly and hyperinflation.   Electronically Signed   By: Andreas Newport M.D.   On: 07/26/2015 01:38    Microbiology: Recent Results (from the past 240 hour(s))  Blood culture (routine x 2)     Status: None (Preliminary result)   Collection Time: 07/26/15  1:12 AM  Result Value Ref Range Status   Specimen Description BLOOD RIGHT HAND  Final   Special Requests BOTTLES DRAWN AEROBIC ONLY 3ML  Final   Culture   Final    NO GROWTH 2 DAYS Performed at [facility]    Report Status PENDING  Incomplete  Blood culture (routine x 2)     Status: None (Preliminary result)   Collection Time: 07/26/15  1:25 AM  Result Value Ref Range Status   Specimen Description BLOOD BLOOD RIGHT FOREARM  Final   Special Requests BOTTLES DRAWN AEROBIC AND ANAEROBIC 5ML AND  3ML  Final   Culture   Final    NO GROWTH 2 DAYS Performed at [facility]    Report Status PENDING  Incomplete     Labs: Basic Metabolic Panel:  Recent Labs Lab 07/24/15 1320 07/26/15 0035 07/26/15 0305 07/27/15 0942  NA 143 137 137 137  K 3.2* 2.8* 2.8* 3.5  CL  --  103 102 109  CO2 26 24 23 23   GLUCOSE 98 88 100* 102*  BUN 10.8 12 11 10   CREATININE 0.9 1.04* 0.96 0.98  CALCIUM 9.1 9.2 8.7* 8.2*  MG  --   --  1.6*  --    Liver Function Tests:  Recent Labs Lab 07/24/15 1320  AST 21  ALT 20  ALKPHOS 78  BILITOT 0.33  PROT 6.7  ALBUMIN 3.6   No results for input(s): LIPASE, AMYLASE in the last 168 hours. No results for input(s): AMMONIA in the last 168 hours. CBC:  Recent Labs Lab 07/24/15 1320 07/26/15 0035 07/26/15 0305 07/27/15 0942 07/28/15 0730 07/29/15 0524  WBC 9.2 21.1* 22.2* 15.1* 13.3* 9.6  NEUTROABS 6.3  --   --  12.7* 9.8* 6.8  HGB 14.1 15.5* 13.9 13.1 13.4 13.3  HCT 42.7 45.0 40.1 39.4 40.6 39.6  MCV 86.5 84.9 84.1 86.2 86.6 85.5  PLT 561* 553* 527* 512* 529* 528*   Cardiac Enzymes:  Recent Labs Lab 07/26/15 0305 07/26/15 0910 07/26/15 1514  TROPONINI <0.03  <0.03 <0.03   BNP: BNP (last 3 results) No results for input(s): BNP in the last 8760 hours.  ProBNP (last 3 results) No results for input(s): PROBNP in the last 8760 hours.  CBG: No results for input(s): GLUCAP in the last 168 hours.     SignedNita Sells  [facility] 07/29/2015, 9:36 AM

## 2015-07-31 LAB — CULTURE, BLOOD (ROUTINE X 2)
CULTURE: NO GROWTH
Culture: NO GROWTH

## 2015-08-11 ENCOUNTER — Ambulatory Visit (INDEPENDENT_AMBULATORY_CARE_PROVIDER_SITE_OTHER): Payer: Medicaid Other | Admitting: Family Medicine

## 2015-08-11 VITALS — BP 117/73 | HR 62 | Temp 98.2°F | Ht 65.0 in | Wt 233.6 lb

## 2015-08-11 DIAGNOSIS — M792 Neuralgia and neuritis, unspecified: Secondary | ICD-10-CM | POA: Diagnosis not present

## 2015-08-11 DIAGNOSIS — E878 Other disorders of electrolyte and fluid balance, not elsewhere classified: Secondary | ICD-10-CM | POA: Diagnosis not present

## 2015-08-11 DIAGNOSIS — E876 Hypokalemia: Secondary | ICD-10-CM

## 2015-08-11 DIAGNOSIS — R7989 Other specified abnormal findings of blood chemistry: Secondary | ICD-10-CM

## 2015-08-11 DIAGNOSIS — D473 Essential (hemorrhagic) thrombocythemia: Secondary | ICD-10-CM

## 2015-08-11 DIAGNOSIS — R748 Abnormal levels of other serum enzymes: Secondary | ICD-10-CM | POA: Diagnosis not present

## 2015-08-11 DIAGNOSIS — Z853 Personal history of malignant neoplasm of breast: Secondary | ICD-10-CM | POA: Diagnosis not present

## 2015-08-11 DIAGNOSIS — Z0000 Encounter for general adult medical examination without abnormal findings: Secondary | ICD-10-CM | POA: Diagnosis not present

## 2015-08-11 DIAGNOSIS — G8929 Other chronic pain: Secondary | ICD-10-CM | POA: Diagnosis not present

## 2015-08-11 DIAGNOSIS — I10 Essential (primary) hypertension: Secondary | ICD-10-CM | POA: Diagnosis not present

## 2015-08-11 DIAGNOSIS — Z720 Tobacco use: Secondary | ICD-10-CM | POA: Diagnosis not present

## 2015-08-11 DIAGNOSIS — D75839 Thrombocytosis, unspecified: Secondary | ICD-10-CM

## 2015-08-11 DIAGNOSIS — L03114 Cellulitis of left upper limb: Secondary | ICD-10-CM

## 2015-08-11 MED ORDER — METOPROLOL SUCCINATE ER 100 MG PO TB24: 100.0000 mg | ORAL_TABLET | Freq: Every day | ORAL | Status: DC

## 2015-08-11 NOTE — Patient Instructions (Signed)
Thank you so much for coming to visit me today! I have refilled your Metoprolol. Please let me know if there are any other medications you need refills on. I will check your CMP and CBC today to see how they are doing since your discharge from the hospital. I will mail you a letter with the results or call you if a result is more urgent. I will also give you information concerning colonoscopy. Please schedule an appointment to get up to date on your pap smear and to consider scheduling a colonoscopy.  It was a pleasure meeting you! Dr. Gerlean Ren  Colonoscopy A colonoscopy is an exam to look at the entire large intestine (colon). This exam can help find problems such as tumors, polyps, inflammation, and areas of bleeding. The exam takes about 1 hour.  LET [facility] CARE PROVIDER KNOW ABOUT:   Any allergies you have.  All medicines you are taking, including vitamins, herbs, eye drops, creams, and over-the-counter medicines.  Previous problems you or members of your family have had with the use of anesthetics.  Any blood disorders you have.  Previous surgeries you have had.  Medical conditions you have. RISKS AND COMPLICATIONS  Generally, this is a safe procedure. However, as with any procedure, complications can occur. Possible complications include:  Bleeding.  Tearing or rupture of the colon wall.  Reaction to medicines given during the exam.  Infection (rare). BEFORE THE PROCEDURE   Ask your health care provider about changing or stopping your regular medicines.  You may be prescribed an oral bowel prep. This involves drinking a large amount of medicated liquid, starting the day before your procedure. The liquid will cause you to have multiple loose stools until your stool is almost clear or light green. This cleans out your colon in preparation for the procedure.  Do not eat or drink anything else once you have started the bowel prep, unless your health care provider tells you  it is safe to do so.  Arrange for someone to drive you home after the procedure. PROCEDURE   You will be given medicine to help you relax (sedative).  You will lie on your side with your knees bent.  A long, flexible tube with a light and camera on the end (colonoscope) will be inserted through the rectum and into the colon. The camera sends video back to a computer screen as it moves through the colon. The colonoscope also releases carbon dioxide gas to inflate the colon. This helps your health care provider see the area better.  During the exam, your health care provider may take a small tissue sample (biopsy) to be examined under a microscope if any abnormalities are found.  The exam is finished when the entire colon has been viewed. AFTER THE PROCEDURE   Do not drive for 24 hours after the exam.  You may have a small amount of blood in your stool.  You may pass moderate amounts of gas and have mild abdominal cramping or bloating. This is caused by the gas used to inflate your colon during the exam.  Ask when your test results will be ready and how you will get your results. Make sure you get your test results. Document Released: 12/03/2000 Document Revised: 09/26/2013 Document Reviewed: 08/13/2013 [facility] Patient Information 2015 [place], [place]. This information is not intended to replace advice given to you by your health care provider. Make sure you discuss any questions you have with your health care provider.

## 2015-08-12 LAB — COMPREHENSIVE METABOLIC PANEL
ALK PHOS: 75 U/L (ref 33–130)
ALT: 18 U/L (ref 6–29)
AST: 23 U/L (ref 10–35)
Albumin: 4.2 g/dL (ref 3.6–5.1)
BILIRUBIN TOTAL: 0.3 mg/dL (ref 0.2–1.2)
BUN: 16 mg/dL (ref 7–25)
CO2: 27 mmol/L (ref 20–31)
CREATININE: 1.12 mg/dL — AB (ref 0.50–1.05)
Calcium: 9.8 mg/dL (ref 8.6–10.4)
Chloride: 102 mmol/L (ref 98–110)
GLUCOSE: 91 mg/dL (ref 65–99)
POTASSIUM: 4 mmol/L (ref 3.5–5.3)
Sodium: 138 mmol/L (ref 135–146)
TOTAL PROTEIN: 7.2 g/dL (ref 6.1–8.1)

## 2015-08-12 LAB — CBC
HCT: 42.4 % (ref 36.0–46.0)
Hemoglobin: 14.2 g/dL (ref 12.0–15.0)
MCH: 28.3 pg (ref 26.0–34.0)
MCHC: 33.5 g/dL (ref 30.0–36.0)
MCV: 84.5 fL (ref 78.0–100.0)
MPV: 10.3 fL (ref 8.6–12.4)
PLATELETS: 631 10*3/uL — AB (ref 150–400)
RBC: 5.02 MIL/uL (ref 3.87–5.11)
RDW: 16.1 % — ABNORMAL HIGH (ref 11.5–15.5)
WBC: 9.7 10*3/uL (ref 4.0–10.5)

## 2015-08-15 ENCOUNTER — Other Ambulatory Visit: Payer: Self-pay | Admitting: *Deleted

## 2015-08-15 NOTE — Progress Notes (Signed)
Subjective:     Patient ID: [name], female   DOB: Jan 07, 1961, 54 y.o.   MRN: [id number]  HPI [name] is a 54yo female presenting today to establish care. - Care Team:  - Cardiologist- Olena Heckle  - Oncologist- [place]  - General Surgeon- [place]  - Radiologist- Valere Dross - Past Medical History: Sinus Problems, h/o Bilateral Breast cancer, HTN, Hyperlipidemia MI in 2008 s/p stent placement, chronic pain in right knee and bilateral hands due to arthritis  - Medications: Metoprolol Succinate 50mg  daily, Allopurinol 300mg  daily, Isosorbid 60mg  daily, Triamterene HCTZ 75-50mg  daily, Atorvastatin 20mg  daily, Tamoxifen 20mg  daily, Klor-Con 40mg  BID, B12, Probioslim, Vitamin D3  - OB History: has been pregnant one time and had miscarriage, had to have d&c  - Surgical History: Hernia surgery at 54yo and 54yo, Stent placement 2008, bilateral mastectomies in 2012, NOTE should not have BP or sticks in left arm   - Social History: Engaged to [place], Retired, Chiropractor by walking and doing situps, Smokes approximately 7 cigarettes in three days, started smoking at age 61, interested in quitting smoking, denies other illicit drug use or alcohol use  - Family History: Heart Disease (mother, father, sister), Breast Cancer (cousins), HTN (mother, sister)  - Health Maintenance: Mammogram in 2012 (received diagnosis of breast cancer), Pap Smear in 2012, no history of colonoscopy  Review of Systems  Chronic edema of left arm, all other negative    Objective:   Physical Exam  Constitutional: She appears well-developed and well-nourished. No distress.  HENT:  Head: Normocephalic and atraumatic.  Right Ear: External ear normal.  Left Ear: External ear normal.  Eyes: Pupils are equal, round, and reactive to light. Right eye exhibits no discharge. Left eye exhibits no discharge.  Cardiovascular: Normal rate and regular rhythm.  Exam reveals no gallop and no friction rub.   No murmur  heard. Pulmonary/Chest: Effort normal. No respiratory distress. She has no wheezes. She has no rales.  Abdominal: Soft. She exhibits no distension. There is no tenderness. There is no rebound.  Musculoskeletal: Normal range of motion.  Chronic edema of left arm, sleeve in place  Neurological: She is alert.  Skin: Skin is warm. No rash noted. No erythema.  Psychiatric: She has a normal mood and affect. Her behavior is normal.      Assessment:     Please refer to Problem List for Assessment.    Plan:     Please refer to Problem List for Plan.

## 2015-08-17 ENCOUNTER — Encounter: Payer: Self-pay | Admitting: Family Medicine

## 2015-08-17 DIAGNOSIS — Z0000 Encounter for general adult medical examination without abnormal findings: Secondary | ICD-10-CM | POA: Insufficient documentation

## 2015-08-17 NOTE — Assessment & Plan Note (Signed)
-   Improved with no erythema noted today. Will recheck WBC on CBC today.

## 2015-08-17 NOTE — Assessment & Plan Note (Signed)
-   BMP today to follow potassium

## 2015-08-17 NOTE — Assessment & Plan Note (Signed)
-   Smoking status discussed. Counseling given.

## 2015-08-17 NOTE — Assessment & Plan Note (Signed)
-   Due for pap smear and colonoscopy. Wishes to return for visit to discuss these in further detail.

## 2015-08-18 ENCOUNTER — Telehealth: Payer: Self-pay | Admitting: *Deleted

## 2015-08-18 NOTE — Telephone Encounter (Signed)
ENCOURAGED PT. TO FORCE FLUIDS TO 64 OUNCES IN A 24 HOUR PERIOD. SHE WILL ALSO TRY WALKING. PT. IS PASSING A LOT OF GAS. SHE HAS TAKEN AN HERBAL LAXATIVE LAST NIGHT AND TODAY WITH SOME RESULTS. THE HERBAL LAXATIVE CAN BE TAKEN TWICE A DAY. INSTRUCTED PT. TO TAKE ANOTHER DOSE OF THE HERBAL LAXATIVE TODAY AND TO TAKE TWICE DAILY. ALSO SUGGESTED STOOL SOFTER AND MIRALAX PARTICULARLY SINCE PT. TAKES PAIN MEDICATION. PT. STATES SHE IS FEELING SOME BETTER AND WILL FOLLOW THE ABOVE INSTRUCTIONS. PT. WILL CALL TOMORROW IS SHE DOES NOT HAVE GOOD RESULTS TONIGHT.

## 2015-08-19 ENCOUNTER — Telehealth: Payer: Self-pay | Admitting: Family Medicine

## 2015-08-19 ENCOUNTER — Other Ambulatory Visit: Payer: Self-pay | Admitting: Oncology

## 2015-08-19 NOTE — Telephone Encounter (Signed)
Has problems with stools. Was told she might need a stool sofhner. She has done colon cleanse and been drinking water Please advise

## 2015-08-19 NOTE — Telephone Encounter (Signed)
PT. CALLED TODAY. SHE HAS NOT HAD ANY MORE RESULTS WITH THE HERBAL LAXATIVE. ENCOURAGED PT. TO PICK UP SOME MIRALAX AND STOOL SOFTER AT HER PHARMACY. ALSO INSTRUCTED PT. TO CONTACT HER PRIMARY CARE PHYSICIAN FOR FURTHER DIRECTIONS. PT. VOICES UNDERSTANDING.

## 2015-08-21 ENCOUNTER — Encounter: Payer: Self-pay | Admitting: Family Medicine

## 2015-08-26 ENCOUNTER — Telehealth: Payer: Self-pay | Admitting: *Deleted

## 2015-08-26 NOTE — Telephone Encounter (Signed)
Call received from patient "requesting return call.  Important questions about arm with lymphedema."  Called [phone].  No answer.  Message left requesting return call to [facility].

## 2015-08-27 ENCOUNTER — Other Ambulatory Visit: Payer: Self-pay | Admitting: *Deleted

## 2015-08-27 DIAGNOSIS — C50912 Malignant neoplasm of unspecified site of left female breast: Secondary | ICD-10-CM

## 2015-08-27 DIAGNOSIS — C50911 Malignant neoplasm of unspecified site of right female breast: Secondary | ICD-10-CM

## 2015-08-28 MED ORDER — POLYETHYLENE GLYCOL 3350 17 GM/SCOOP PO POWD: 17.0000 g | Freq: Two times a day (BID) | ORAL | Status: DC | PRN

## 2015-08-28 NOTE — Telephone Encounter (Signed)
Prescription for Miralax sent to pharmacy.

## 2015-08-28 NOTE — Telephone Encounter (Signed)
Ambulatory referral made on 08-27-2015 and appointment has been made for lymphedema clinic.

## 2015-08-28 NOTE — Telephone Encounter (Signed)
LVM for pt to return call to inform of below. Katharina Caper, April D, [place]

## 2015-08-29 ENCOUNTER — Other Ambulatory Visit: Payer: Self-pay | Admitting: *Deleted

## 2015-08-29 DIAGNOSIS — I10 Essential (primary) hypertension: Secondary | ICD-10-CM

## 2015-08-29 DIAGNOSIS — Z853 Personal history of malignant neoplasm of breast: Secondary | ICD-10-CM

## 2015-08-29 DIAGNOSIS — M792 Neuralgia and neuritis, unspecified: Secondary | ICD-10-CM

## 2015-08-29 DIAGNOSIS — G8929 Other chronic pain: Secondary | ICD-10-CM

## 2015-08-29 MED ORDER — METHADONE HCL 5 MG PO TABS: 15.0000 mg | ORAL_TABLET | Freq: Three times a day (TID) | ORAL | Status: DC

## 2015-09-08 ENCOUNTER — Other Ambulatory Visit: Payer: Self-pay | Admitting: Family Medicine

## 2015-09-10 ENCOUNTER — Ambulatory Visit (HOSPITAL_BASED_OUTPATIENT_CLINIC_OR_DEPARTMENT_OTHER): Payer: Medicaid Other

## 2015-09-10 ENCOUNTER — Ambulatory Visit: Payer: Medicaid Other | Admitting: Physical Therapy

## 2015-09-10 NOTE — Telephone Encounter (Signed)
Please contact patient to inform what her last test results were.

## 2015-09-11 NOTE — Telephone Encounter (Signed)
Contacted concerning lab results. Will repeat BMP and CBC at next visit on 9/28 to check improvement of creatinine and platelets. No further questions at this time.

## 2015-09-17 ENCOUNTER — Other Ambulatory Visit (HOSPITAL_COMMUNITY)
Admission: RE | Admit: 2015-09-17 | Discharge: 2015-09-17 | Disposition: A | Discharge status: Home / Self Care | Payer: Medicaid Other | Source: Ambulatory Visit | Attending: Family Medicine | Admitting: Family Medicine

## 2015-09-17 ENCOUNTER — Ambulatory Visit (INDEPENDENT_AMBULATORY_CARE_PROVIDER_SITE_OTHER): Payer: Medicaid Other | Admitting: Family Medicine

## 2015-09-17 VITALS — BP 138/85 | HR 72 | Temp 98.1°F | Wt 239.1 lb

## 2015-09-17 DIAGNOSIS — Z01419 Encounter for gynecological examination (general) (routine) without abnormal findings: Secondary | ICD-10-CM | POA: Diagnosis present

## 2015-09-17 DIAGNOSIS — G8929 Other chronic pain: Secondary | ICD-10-CM | POA: Diagnosis not present

## 2015-09-17 DIAGNOSIS — N179 Acute kidney failure, unspecified: Secondary | ICD-10-CM | POA: Diagnosis present

## 2015-09-17 DIAGNOSIS — D473 Essential (hemorrhagic) thrombocythemia: Secondary | ICD-10-CM | POA: Diagnosis not present

## 2015-09-17 DIAGNOSIS — Z0000 Encounter for general adult medical examination without abnormal findings: Secondary | ICD-10-CM

## 2015-09-17 DIAGNOSIS — D72829 Elevated white blood cell count, unspecified: Secondary | ICD-10-CM

## 2015-09-17 DIAGNOSIS — Z853 Personal history of malignant neoplasm of breast: Secondary | ICD-10-CM

## 2015-09-17 DIAGNOSIS — I10 Essential (primary) hypertension: Secondary | ICD-10-CM | POA: Diagnosis not present

## 2015-09-17 DIAGNOSIS — M792 Neuralgia and neuritis, unspecified: Secondary | ICD-10-CM | POA: Diagnosis not present

## 2015-09-17 DIAGNOSIS — Z1151 Encounter for screening for human papillomavirus (HPV): Secondary | ICD-10-CM | POA: Insufficient documentation

## 2015-09-17 DIAGNOSIS — D75839 Thrombocytosis, unspecified: Secondary | ICD-10-CM

## 2015-09-17 MED ORDER — TRIAMTERENE-HCTZ 75-50 MG PO TABS: 1.0000 | ORAL_TABLET | Freq: Every day | ORAL | Status: DC

## 2015-09-17 MED ORDER — METOPROLOL SUCCINATE ER 100 MG PO TB24: 100.0000 mg | ORAL_TABLET | Freq: Every day | ORAL | Status: DC

## 2015-09-17 MED ORDER — ATORVASTATIN CALCIUM 20 MG PO TABS: 20.0000 mg | ORAL_TABLET | Freq: Every day | ORAL | Status: DC

## 2015-09-17 MED ORDER — MAGNESIUM CHLORIDE 64 MG PO TBEC
3.0000 | DELAYED_RELEASE_TABLET | Freq: Every day | ORAL | Status: DC
Start: 2015-09-17 — End: 2015-11-17

## 2015-09-17 MED ORDER — TAMOXIFEN CITRATE 20 MG PO TABS: ORAL_TABLET | ORAL | Status: DC

## 2015-09-17 MED ORDER — VITAMIN D3 125 MCG (5000 UT) PO CAPS: ORAL_CAPSULE | ORAL | Status: DC

## 2015-09-17 MED ORDER — POTASSIUM CHLORIDE CRYS ER 20 MEQ PO TBCR: EXTENDED_RELEASE_TABLET | ORAL | Status: DC

## 2015-09-17 MED ORDER — ISOSORBIDE MONONITRATE ER 60 MG PO TB24: 60.0000 mg | ORAL_TABLET | Freq: Every morning | ORAL | Status: DC

## 2015-09-17 MED ORDER — POLYETHYLENE GLYCOL 3350 17 GM/SCOOP PO POWD: 17.0000 g | Freq: Two times a day (BID) | ORAL | Status: DC | PRN

## 2015-09-17 MED ORDER — ALLOPURINOL 300 MG PO TABS: 300.0000 mg | ORAL_TABLET | Freq: Every day | ORAL | Status: DC

## 2015-09-17 NOTE — Patient Instructions (Signed)
Thank you so much for coming to visit me today! I will let you know the results of your pap smear and lab results. Please let us know if there's anything else we can do for you!  Thanks again! Dr. Gerlean Ren

## 2015-09-18 LAB — CBC
HCT: 44.1 % (ref 36.0–46.0)
Hemoglobin: 14.7 g/dL (ref 12.0–15.0)
MCH: 28.1 pg (ref 26.0–34.0)
MCHC: 33.3 g/dL (ref 30.0–36.0)
MCV: 84.2 fL (ref 78.0–100.0)
MPV: 10.3 fL (ref 8.6–12.4)
Platelets: 603 10*3/uL — ABNORMAL HIGH (ref 150–400)
RBC: 5.24 MIL/uL — ABNORMAL HIGH (ref 3.87–5.11)
RDW: 16.5 % — ABNORMAL HIGH (ref 11.5–15.5)
WBC: 9.4 10*3/uL (ref 4.0–10.5)

## 2015-09-18 LAB — BASIC METABOLIC PANEL
BUN: 16 mg/dL (ref 7–25)
CALCIUM: 10 mg/dL (ref 8.6–10.4)
CO2: 27 mmol/L (ref 20–31)
CREATININE: 1.07 mg/dL — AB (ref 0.50–1.05)
Chloride: 103 mmol/L (ref 98–110)
GLUCOSE: 93 mg/dL (ref 65–99)
Potassium: 3.9 mmol/L (ref 3.5–5.3)
SODIUM: 141 mmol/L (ref 135–146)

## 2015-09-18 LAB — CYTOLOGY - PAP

## 2015-09-19 ENCOUNTER — Encounter: Payer: Self-pay | Admitting: Family Medicine

## 2015-09-19 DIAGNOSIS — N179 Acute kidney failure, unspecified: Secondary | ICD-10-CM | POA: Insufficient documentation

## 2015-09-19 HISTORY — DX: Acute kidney failure, unspecified: N17.9

## 2015-09-19 NOTE — Assessment & Plan Note (Signed)
-   Suspect reactive due to recent hospitalization - Will check CBC today. If continues to worsen, consider further workup.

## 2015-09-19 NOTE — Assessment & Plan Note (Signed)
-   Pap smear obtained today. Will contact with results.

## 2015-09-19 NOTE — Progress Notes (Signed)
Subjective:     Patient ID: [name], female   DOB: 10-12-1961, 54 y.o.   MRN: [id number]  HPI Mrs. Poch is a 54yo female presenting today for pap smear and follow up of lab work. - CMP on 8/22 with elevated creatinine of 1.12. - CBC on 8/22 with elevated platelets of 631. - Last mammogram in 2012 - Needs refills on all of her medications today. Has - BP well controlled. - Denies any acute symptoms at this time. - Smoking status reviewed.  Review of Systems Per HPI    Objective:   Physical Exam  Constitutional: She is oriented to person, place, and time. She appears well-developed and well-nourished. No distress.  HENT:  Head: Normocephalic and atraumatic.  Mouth/Throat: No oropharyngeal exudate.  No oral lesions noted  Eyes: Pupils are equal, round, and reactive to light. Right eye exhibits no discharge. Left eye exhibits no discharge.  Neck: Normal range of motion. Neck supple.  Cardiovascular: Normal rate and regular rhythm.  Exam reveals no gallop and no friction rub.   No murmur heard. Pulmonary/Chest: Breath sounds normal. No respiratory distress. She has no wheezes.  Abdominal: Soft. Bowel sounds are normal. She exhibits no distension. There is no tenderness.  Musculoskeletal: She exhibits no edema.  Neurological: She is alert and oriented to person, place, and time.  Psychiatric: She has a normal mood and affect. Her behavior is normal.       Assessment and Plan:     Thrombocytosis - Suspect reactive due to recent hospitalization - Will check CBC today. If continues to worsen, consider further workup.  Acute kidney injury - Recheck BMP for improvement   Healthcare maintenance - Pap smear obtained today. Will contact with results.

## 2015-09-19 NOTE — Assessment & Plan Note (Signed)
-   Recheck BMP for improvement

## 2015-09-22 ENCOUNTER — Telehealth: Payer: Self-pay | Admitting: *Deleted

## 2015-09-22 DIAGNOSIS — G8929 Other chronic pain: Secondary | ICD-10-CM

## 2015-09-22 DIAGNOSIS — I10 Essential (primary) hypertension: Secondary | ICD-10-CM

## 2015-09-22 DIAGNOSIS — M792 Neuralgia and neuritis, unspecified: Secondary | ICD-10-CM

## 2015-09-22 DIAGNOSIS — Z853 Personal history of malignant neoplasm of breast: Secondary | ICD-10-CM

## 2015-09-22 NOTE — Telephone Encounter (Signed)
FYI [id number] voicemail retrieved at 1712, forwarded at 1713 " I need Dr. [name] to sign a paper for my schooling.  Nira Conn did but it must be signed by the doctor, not the PA.  I also need my methadone refilled.  Call me at [phone]."

## 2015-09-23 ENCOUNTER — Telehealth: Payer: Self-pay | Admitting: Family Medicine

## 2015-09-23 ENCOUNTER — Encounter: Payer: Self-pay | Admitting: Family Medicine

## 2015-09-23 ENCOUNTER — Ambulatory Visit: Payer: Medicaid Other | Attending: Oncology | Admitting: Physical Therapy

## 2015-09-23 DIAGNOSIS — I972 Postmastectomy lymphedema syndrome: Secondary | ICD-10-CM

## 2015-09-23 DIAGNOSIS — D473 Essential (hemorrhagic) thrombocythemia: Secondary | ICD-10-CM

## 2015-09-23 DIAGNOSIS — D75839 Thrombocytosis, unspecified: Secondary | ICD-10-CM

## 2015-09-23 MED ORDER — METHADONE HCL 5 MG PO TABS: 15.0000 mg | ORAL_TABLET | Freq: Three times a day (TID) | ORAL | Status: DC

## 2015-09-23 MED ORDER — FLUCONAZOLE 150 MG PO TABS: 150.0000 mg | ORAL_TABLET | Freq: Once | ORAL | Status: DC
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
## 2015-09-23 NOTE — Telephone Encounter (Signed)
Spoke with patient this morning regarding paperwork that Gentry Fitz, NP filled out for her to "get back into college"- patient is requesting that Dr. [name] fill paperwork out and sign it because it was not accepted with and NP's signature.  Patient will drop off paperwork on Friday - RN will fill it out and have Dr. [name] sign it next Monday.  Patient is requesting it be mailed back to her because she does not have transportation at this time. Patient is also requesting a prescription for methadone and states she knows she cannot fill the prescription until the 14th of October but will be here on Friday and will be unable to come back due to transportation.

## 2015-09-23 NOTE — Telephone Encounter (Signed)
Contacted concerning lab results. Thrombocytosis improving but still noted. Suspect to be secondary to recent hospitalization and infection. Return in three months for lab visit to recheck CBC. Pap smear with signs of yeast infection, prescription for Diflucan sent to pharmacy.

## 2015-09-25 ENCOUNTER — Telehealth: Payer: Self-pay | Admitting: Family Medicine

## 2015-09-25 NOTE — Telephone Encounter (Signed)
Pt called and would like a referral if possible because she is having issues with memory. She is very forgetful and just doesn't know what is going on. jw

## 2015-09-26 NOTE — Telephone Encounter (Signed)
She needs to schedule appointment so this can be discussed and further evaluated.

## 2015-09-29 ENCOUNTER — Telehealth: Payer: Self-pay

## 2015-09-29 ENCOUNTER — Other Ambulatory Visit: Payer: Self-pay | Admitting: Nurse Practitioner

## 2015-09-29 NOTE — Telephone Encounter (Signed)
Needs Dr [name] to sign letter to start college in the next 30 days. She has a cyst on her chest that Dr [name] has seen before. She wants [name] to know it is still there and she wants an appt before February - she said "next month would be great".

## 2015-09-29 NOTE — Telephone Encounter (Signed)
Pt is agreeable with plan and appt made for 10/15/15 @ 9:30. Mahmud Keithly, Salome Spotted, CMA

## 2015-10-02 ENCOUNTER — Telehealth: Payer: Self-pay

## 2015-10-02 ENCOUNTER — Encounter: Payer: Self-pay | Admitting: Oncology

## 2015-10-02 NOTE — Progress Notes (Signed)
Placed nctracks form on desk of nurse for dr. [name].

## 2015-10-02 NOTE — Telephone Encounter (Signed)
Patient called regarding the letter she requested for school.  Per Gentry Fitz, NP, Raquel is working on it and it will be left up front with Wilma on Friday for patient to pick up.  Patient stated understanding.

## 2015-10-03 ENCOUNTER — Encounter: Payer: Self-pay | Admitting: Oncology

## 2015-10-03 NOTE — Progress Notes (Signed)
I placed disabl form on desk on nurse of dr. [name]

## 2015-10-06 ENCOUNTER — Telehealth: Payer: Self-pay | Admitting: *Deleted

## 2015-10-06 NOTE — Telephone Encounter (Signed)
Patient left a voice message stating,"I need Val or Zigmund Daniel to call me back some time today. I don't want to talk to any other nurse. I need Val or Zigmund Daniel. My number is [phone].

## 2015-10-08 ENCOUNTER — Encounter: Payer: Self-pay | Admitting: Oncology

## 2015-10-08 ENCOUNTER — Other Ambulatory Visit: Payer: Self-pay | Admitting: Nurse Practitioner

## 2015-10-08 NOTE — Progress Notes (Signed)
Form was faxed to [phone] from dr. [name] nurse. Form on file

## 2015-10-13 ENCOUNTER — Other Ambulatory Visit: Payer: Medicaid Other

## 2015-10-13 ENCOUNTER — Ambulatory Visit: Payer: Medicaid Other | Admitting: Nurse Practitioner

## 2015-10-15 ENCOUNTER — Telehealth: Payer: Self-pay

## 2015-10-15 ENCOUNTER — Ambulatory Visit (INDEPENDENT_AMBULATORY_CARE_PROVIDER_SITE_OTHER): Payer: Medicaid Other | Admitting: Family Medicine

## 2015-10-15 VITALS — BP 138/90 | Temp 98.2°F | Ht 64.0 in | Wt 236.5 lb

## 2015-10-15 DIAGNOSIS — L509 Urticaria, unspecified: Secondary | ICD-10-CM | POA: Diagnosis not present

## 2015-10-15 DIAGNOSIS — R531 Weakness: Secondary | ICD-10-CM

## 2015-10-15 DIAGNOSIS — R413 Other amnesia: Secondary | ICD-10-CM | POA: Diagnosis not present

## 2015-10-15 DIAGNOSIS — Z0000 Encounter for general adult medical examination without abnormal findings: Secondary | ICD-10-CM

## 2015-10-15 DIAGNOSIS — Z1211 Encounter for screening for malignant neoplasm of colon: Secondary | ICD-10-CM

## 2015-10-15 LAB — VITAMIN B12: VITAMIN B 12: 768 pg/mL (ref 211–911)

## 2015-10-15 LAB — TSH: TSH: 1.719 u[IU]/mL (ref 0.350–4.500)

## 2015-10-15 MED ORDER — DIPHENHYDRAMINE HCL 50 MG PO CAPS
50.0000 mg | ORAL_CAPSULE | Freq: Once | ORAL | Status: AC
  Administered 2015-10-15: 50 mg via ORAL

## 2015-10-15 MED ORDER — DIPHENHYDRAMINE HCL 50 MG PO TABS: 50.0000 mg | ORAL_TABLET | Freq: Every evening | ORAL | Status: DC | PRN

## 2015-10-15 NOTE — Telephone Encounter (Signed)
Called patient and let her know that her appointment for her MR Brain w/wo is October 30, 2015 at [facility] with an arrival time at 43.  She is to be NPO 4 hours prior to test. Patient seemed to understand appointment time.[name]

## 2015-10-15 NOTE — Progress Notes (Signed)
Patient was given 1 50 mg while in office and was sent home with three 50 mg diphenhydramine HCI per Dr. Gerlean Ren. Ozella Almond

## 2015-10-15 NOTE — Patient Instructions (Signed)
Thank you so much for coming to visit today! We will check your thyroid and vitamin B12 levels today. We will also get an MRI of your brain. They should call to schedule this. I will let you know if any of these come back abnormal.  I will contact your Aid and discuss lengthening the amount of time you have her each day.  I have placed a referral to GI for your colonoscopy. They should contact you to schedule an appointment.  If you develop any numbness, weakness, severe headache, or changes in speech or vision, please call 911.  Thanks again! It's always a pleasure seeing you! Dr. Gerlean Ren

## 2015-10-18 DIAGNOSIS — R413 Other amnesia: Secondary | ICD-10-CM | POA: Insufficient documentation

## 2015-10-18 NOTE — Assessment & Plan Note (Signed)
GI referral for colonoscopy

## 2015-10-18 NOTE — Progress Notes (Signed)
Subjective:     Patient ID: [name], female   DOB: 19-Oct-1961, 54 y.o.   MRN: [id number]  HPI [name] is a 54yo female presenting today for memory loss. - Reports difficulty with memory since August. Also notes bilateral blurred vision for same length of time - Blurred vision is constant - Initially reported only forgetting names or what she was doing. Then went on to report she would forget where she was and who she was talking to on the phone.  - Has cousin with dementia, unknown what type. Unsure if mother, father, or other direct family members had memory problems. - States she believes she has history of thyroid problems a long time ago, but she has not had any problems recently - Notes occasional headache - Denies numbness, weakness - Last MRI brain 08/2014. In chart, states this was for blurred vision and memory loss, however [name] denies that these symptoms were present at that time and that it was instead obtained for headache. States she has never had memory loss or blurred vision before and that this is completely new since August 2016. - Reports she has a nurse aid to help her at home with her medicine, cooking, dressing, bathing  Would like aid present for longer hours in week. Currently have them for 2.5 hours daily. Would like for 4-5hr each day.  Reports [employer] is the company that provides the aids  Requested that we contact aids:  [name] [phone] responded; note that online it is reported as a scam number  Kennyth Lose [phone] similar to what [name] stated. Requires help with bathing, dressing, going to bathroom. Notes memory is worsening and concerning her. States [name] is able to do little for herself. Can't remember to take her medications without her aids.  [place]. Located in [place], [place]. Does not provide services to [place], [place], which is where [name] lives. Does not have any nurses that visit  [place].  Note that this visit was video precepted and [name] was noted saying that the Aids were "like her little maids" and that "they took care of her and she took care of them." Concern that opinion of Aid might be biased by financial benefit of taking care of [name]. - Reports mild rash on forehead x1 day. Has not tried any medications yet. Itches. Denies difficulty breathing, swelling, rash elsewhere - Agreeable to colonoscopy for colon cancer screening.  - Smokes, but working on quitting  Review of Systems Per HPI    Objective:   Physical Exam  Constitutional: She is oriented to person, place, and time. She appears well-developed and well-nourished. No distress.  HENT:  Head: Normocephalic and atraumatic.  Cardiovascular: Normal rate and regular rhythm.  Exam reveals no gallop and no friction rub.   No murmur heard. Pulmonary/Chest: Effort normal. No respiratory distress. She has no wheezes.  Abdominal: Soft. She exhibits no distension. There is no tenderness.  Musculoskeletal: She exhibits no edema.  Neurological: She is alert and oriented to person, place, and time. No cranial nerve deficit.  Muscle strength 5/5 in upper and lower extremities, sensation intact bilaterally in upper and lower extremities, able to draw appropriate clock, short term recall 3/3, delayed recall 2/3  Skin:  Mild papules noted on forehead without erythema or warmth  Psychiatric: She has a normal mood and affect. Her behavior is normal.       Assessment and Plan:     Memory loss - Given  memory loss and blurred vision since August with abrupt onset, will obtain MRI of brain with and without contrast. Note history of malignancy, increasing risk for stroke and risk of possible metastasis - Counseled on smoking cessation - Will obtain vitamin B12 and TSH today.   - Requests increased time with home health. Request somewhat suspicious given agency given is in [place], [place] and does not  provide services to [place], [place]. Also suspect Aids reporting further help needed benefiting financially from services. Will refer Occupational Therapy for objective assessment of needs.  Healthcare maintenance - GI referral for colonoscopy

## 2015-10-18 NOTE — Assessment & Plan Note (Addendum)
-   Given memory loss and blurred vision since August with abrupt onset, will obtain MRI of brain with and without contrast. Note history of malignancy, increasing risk for stroke and risk of possible metastasis - Counseled on smoking cessation - Will obtain vitamin B12 and TSH today.   - Requests increased time with home health. Request somewhat suspicious given agency given is in [place], [place] and does not provide services to [place], [place]. Also suspect Aids reporting further help needed benefiting financially from services. Will refer Occupational Therapy for objective assessment of needs.

## 2015-10-20 ENCOUNTER — Telehealth: Payer: Self-pay | Admitting: *Deleted

## 2015-10-20 NOTE — Telephone Encounter (Signed)
Message forwarded from Yoe from pt requesting a return call "about this nodule on my chest ". " this is serious "  Pt is specifically asking for " Zigmund Daniel or Mateo Flow ".  This RN was then notified by Darryll Capers at front pt lobby desk stating pt was transferred to her to request to speak to this RN.  This RN returned call to pt and obtained number identified VM- message left informing to return call and may speak with [facility] nurse for more urgent concerns.  Pt returned call - she states nodule is not new and has been evaluated "but can I get an appointment before December ?"  This RN inquired further with pt including if she has a more urgent concerns relating to cancer she could be seen with the symptom management.  Brylan stated " no- no I really don't have anything urgent "  Pt did ask if she could " pick up my medication next Monday so I can get it filled by the the 11th."  This RN informed pt she may pick up medication on the 9th of November.

## 2015-10-22 ENCOUNTER — Telehealth: Payer: Self-pay | Admitting: Family Medicine

## 2015-10-22 ENCOUNTER — Telehealth: Payer: Self-pay | Admitting: *Deleted

## 2015-10-22 NOTE — Telephone Encounter (Signed)
Pt called and would like her lab results. jw °

## 2015-10-22 NOTE — Telephone Encounter (Signed)
"  My face is breaking out with small bumps that itch really bad.  Started two weeks ago.  My PCP checked labs and gave me benadryl script for the rash.  I want Dr. [name] to look at this further when I come in on 12-02-2015.  I'll call if I need an earlier appointment.  I also need to pick up my refill for Methadone.  I'll pick this up next Monday through Wednesday."  Return number [phone].  "I grow facial hair and shave my face which I've done for a long time.  No new medications, soaps.  I have a vanilla lotion I've used the past two weeks."  This nurse advised she stop using this lotion to determine if it caused the rash.

## 2015-10-23 NOTE — Telephone Encounter (Signed)
Spoke with pt

## 2015-10-23 NOTE — Telephone Encounter (Signed)
Labs normal so far. Please go to MRI as scheduled.

## 2015-10-29 ENCOUNTER — Other Ambulatory Visit: Payer: Self-pay

## 2015-10-29 DIAGNOSIS — M792 Neuralgia and neuritis, unspecified: Secondary | ICD-10-CM

## 2015-10-29 DIAGNOSIS — Z853 Personal history of malignant neoplasm of breast: Secondary | ICD-10-CM

## 2015-10-29 DIAGNOSIS — I10 Essential (primary) hypertension: Secondary | ICD-10-CM

## 2015-10-29 DIAGNOSIS — G8929 Other chronic pain: Secondary | ICD-10-CM

## 2015-10-29 MED ORDER — METHADONE HCL 5 MG PO TABS
15.0000 mg | ORAL_TABLET | Freq: Three times a day (TID) | ORAL | Status: DC
Start: 2015-10-29 — End: 2015-11-25

## 2015-10-30 ENCOUNTER — Ambulatory Visit (HOSPITAL_COMMUNITY): Admission: RE | Admit: 2015-10-30 | Payer: Medicaid Other | Source: Ambulatory Visit

## 2015-11-05 ENCOUNTER — Encounter: Payer: Medicaid Other | Admitting: Family Medicine

## 2015-11-12 ENCOUNTER — Ambulatory Visit (HOSPITAL_COMMUNITY)
Admission: RE | Admit: 2015-11-12 | Discharge: 2015-11-12 | Disposition: A | Discharge status: Home / Self Care | Payer: Medicaid Other | Source: Ambulatory Visit | Attending: Family Medicine | Admitting: Family Medicine

## 2015-11-12 DIAGNOSIS — Z853 Personal history of malignant neoplasm of breast: Secondary | ICD-10-CM | POA: Insufficient documentation

## 2015-11-12 DIAGNOSIS — H538 Other visual disturbances: Secondary | ICD-10-CM | POA: Insufficient documentation

## 2015-11-12 DIAGNOSIS — R413 Other amnesia: Secondary | ICD-10-CM | POA: Insufficient documentation

## 2015-11-12 LAB — CREATININE, SERUM
Creatinine, Ser: 1.05 mg/dL — ABNORMAL HIGH (ref 0.44–1.00)
GFR, EST NON AFRICAN AMERICAN: 60 mL/min — AB (ref >60)

## 2015-11-12 MED ORDER — GADOBENATE DIMEGLUMINE 529 MG/ML IV SOLN
20.0000 mL | Freq: Once | INTRAVENOUS | Status: AC
  Administered 2015-11-12: 20 mL via INTRAVENOUS

## 2015-11-17 ENCOUNTER — Inpatient Hospital Stay (HOSPITAL_COMMUNITY): Admit: 2015-11-17 | Payer: Medicaid Other

## 2015-11-17 ENCOUNTER — Emergency Department (HOSPITAL_COMMUNITY): Payer: Medicaid Other

## 2015-11-17 ENCOUNTER — Inpatient Hospital Stay (HOSPITAL_COMMUNITY)
Admission: EM | Admit: 2015-11-17 | Discharge: 2015-11-19 | DRG: 603 | Disposition: A | Discharge status: Home / Self Care | Payer: Medicaid Other | Attending: Family Medicine | Admitting: Family Medicine

## 2015-11-17 ENCOUNTER — Encounter (HOSPITAL_COMMUNITY): Payer: Self-pay | Admitting: Emergency Medicine

## 2015-11-17 DIAGNOSIS — I890 Lymphedema, not elsewhere classified: Secondary | ICD-10-CM | POA: Diagnosis present

## 2015-11-17 DIAGNOSIS — I25119 Atherosclerotic heart disease of native coronary artery with unspecified angina pectoris: Secondary | ICD-10-CM | POA: Diagnosis not present

## 2015-11-17 DIAGNOSIS — Z79899 Other long term (current) drug therapy: Secondary | ICD-10-CM | POA: Diagnosis not present

## 2015-11-17 DIAGNOSIS — F1920 Other psychoactive substance dependence, uncomplicated: Secondary | ICD-10-CM

## 2015-11-17 DIAGNOSIS — G8929 Other chronic pain: Secondary | ICD-10-CM | POA: Diagnosis present

## 2015-11-17 DIAGNOSIS — Z923 Personal history of irradiation: Secondary | ICD-10-CM

## 2015-11-17 DIAGNOSIS — E785 Hyperlipidemia, unspecified: Secondary | ICD-10-CM | POA: Diagnosis present

## 2015-11-17 DIAGNOSIS — R079 Chest pain, unspecified: Secondary | ICD-10-CM

## 2015-11-17 DIAGNOSIS — Z720 Tobacco use: Secondary | ICD-10-CM

## 2015-11-17 DIAGNOSIS — M7989 Other specified soft tissue disorders: Secondary | ICD-10-CM | POA: Diagnosis present

## 2015-11-17 DIAGNOSIS — I2510 Atherosclerotic heart disease of native coronary artery without angina pectoris: Secondary | ICD-10-CM | POA: Diagnosis present

## 2015-11-17 DIAGNOSIS — L0390 Cellulitis, unspecified: Secondary | ICD-10-CM | POA: Diagnosis present

## 2015-11-17 DIAGNOSIS — M1990 Unspecified osteoarthritis, unspecified site: Secondary | ICD-10-CM | POA: Diagnosis present

## 2015-11-17 DIAGNOSIS — I252 Old myocardial infarction: Secondary | ICD-10-CM | POA: Diagnosis not present

## 2015-11-17 DIAGNOSIS — Z853 Personal history of malignant neoplasm of breast: Secondary | ICD-10-CM

## 2015-11-17 DIAGNOSIS — Z9013 Acquired absence of bilateral breasts and nipples: Secondary | ICD-10-CM | POA: Diagnosis not present

## 2015-11-17 DIAGNOSIS — E876 Hypokalemia: Secondary | ICD-10-CM | POA: Diagnosis not present

## 2015-11-17 DIAGNOSIS — F17200 Nicotine dependence, unspecified, uncomplicated: Secondary | ICD-10-CM

## 2015-11-17 DIAGNOSIS — F17210 Nicotine dependence, cigarettes, uncomplicated: Secondary | ICD-10-CM | POA: Diagnosis present

## 2015-11-17 DIAGNOSIS — Z7982 Long term (current) use of aspirin: Secondary | ICD-10-CM

## 2015-11-17 DIAGNOSIS — Z955 Presence of coronary angioplasty implant and graft: Secondary | ICD-10-CM | POA: Diagnosis not present

## 2015-11-17 DIAGNOSIS — M109 Gout, unspecified: Secondary | ICD-10-CM | POA: Diagnosis present

## 2015-11-17 DIAGNOSIS — R0602 Shortness of breath: Secondary | ICD-10-CM | POA: Insufficient documentation

## 2015-11-17 DIAGNOSIS — L03114 Cellulitis of left upper limb: Secondary | ICD-10-CM | POA: Diagnosis not present

## 2015-11-17 DIAGNOSIS — E872 Acidosis: Secondary | ICD-10-CM | POA: Diagnosis present

## 2015-11-17 DIAGNOSIS — Z9889 Other specified postprocedural states: Secondary | ICD-10-CM | POA: Diagnosis not present

## 2015-11-17 DIAGNOSIS — I700 Atherosclerosis of aorta: Secondary | ICD-10-CM | POA: Diagnosis present

## 2015-11-17 DIAGNOSIS — I10 Essential (primary) hypertension: Secondary | ICD-10-CM | POA: Diagnosis present

## 2015-11-17 DIAGNOSIS — R7989 Other specified abnormal findings of blood chemistry: Secondary | ICD-10-CM | POA: Diagnosis present

## 2015-11-17 DIAGNOSIS — R748 Abnormal levels of other serum enzymes: Secondary | ICD-10-CM

## 2015-11-17 LAB — I-STAT CG4 LACTIC ACID, ED
LACTIC ACID, VENOUS: 1.18 mmol/L (ref 0.5–2.0)
Lactic Acid, Venous: 2.56 mmol/L (ref 0.5–2.0)

## 2015-11-17 LAB — INFLUENZA PANEL BY PCR (TYPE A & B)
H1N1 flu by pcr: NOT DETECTED
Influenza A By PCR: NEGATIVE
Influenza B By PCR: NEGATIVE

## 2015-11-17 LAB — CBC
HEMATOCRIT: 41.5 % (ref 36.0–46.0)
Hemoglobin: 13.9 g/dL (ref 12.0–15.0)
MCH: 29 pg (ref 26.0–34.0)
MCHC: 33.5 g/dL (ref 30.0–36.0)
MCV: 86.5 fL (ref 78.0–100.0)
Platelets: 455 10*3/uL — ABNORMAL HIGH (ref 150–400)
RBC: 4.8 MIL/uL (ref 3.87–5.11)
RDW: 15.7 % — ABNORMAL HIGH (ref 11.5–15.5)
WBC: 13.5 10*3/uL — AB (ref 4.0–10.5)

## 2015-11-17 LAB — I-STAT TROPONIN, ED
Troponin i, poc: 0.01 ng/mL (ref 0.00–0.08)
Troponin i, poc: 0 ng/mL (ref 0.00–0.08)

## 2015-11-17 LAB — TROPONIN I

## 2015-11-17 LAB — BASIC METABOLIC PANEL
Anion gap: 10 (ref 5–15)
BUN: 12 mg/dL (ref 6–20)
CHLORIDE: 103 mmol/L (ref 101–111)
CO2: 24 mmol/L (ref 22–32)
Calcium: 9.1 mg/dL (ref 8.9–10.3)
Creatinine, Ser: 1.13 mg/dL — ABNORMAL HIGH (ref 0.44–1.00)
GFR calc non Af Amer: 55 mL/min — ABNORMAL LOW (ref >60)
Glucose, Bld: 101 mg/dL — ABNORMAL HIGH (ref 65–99)
POTASSIUM: 3.6 mmol/L (ref 3.5–5.1)
SODIUM: 137 mmol/L (ref 135–145)

## 2015-11-17 LAB — D-DIMER, QUANTITATIVE (NOT AT ARMC): D DIMER QUANT: 0.65 ug{FEU}/mL — AB (ref 0.00–0.50)

## 2015-11-17 LAB — BRAIN NATRIURETIC PEPTIDE: B NATRIURETIC PEPTIDE 5: 125.1 pg/mL — AB (ref 0.0–100.0)

## 2015-11-17 MED ORDER — OSELTAMIVIR PHOSPHATE 75 MG PO CAPS: 75.0000 mg | ORAL_CAPSULE | Freq: Once | ORAL | Status: DC

## 2015-11-17 MED ORDER — SODIUM CHLORIDE 0.9 % IV BOLUS (SEPSIS): 1000.0000 mL | Freq: Once | INTRAVENOUS | Status: DC

## 2015-11-17 MED ORDER — METOPROLOL SUCCINATE ER 100 MG PO TB24
100.0000 mg | ORAL_TABLET | Freq: Every day | ORAL | Status: DC
  Administered 2015-11-18 – 2015-11-19 (×2): 100 mg via ORAL
  Filled 2015-11-17 (×2): qty 1

## 2015-11-17 MED ORDER — OSELTAMIVIR PHOSPHATE 75 MG PO CAPS
75.0000 mg | ORAL_CAPSULE | Freq: Once | ORAL | Status: AC
  Administered 2015-11-17: 75 mg via ORAL
  Filled 2015-11-17: qty 1

## 2015-11-17 MED ORDER — SODIUM CHLORIDE 0.9 % IJ SOLN
3.0000 mL | Freq: Two times a day (BID) | INTRAMUSCULAR | Status: DC
  Administered 2015-11-17 – 2015-11-18 (×2): 3 mL via INTRAVENOUS

## 2015-11-17 MED ORDER — SODIUM CHLORIDE 0.9 % IV BOLUS (SEPSIS)
500.0000 mL | INTRAVENOUS | Status: AC
  Administered 2015-11-17: 500 mL via INTRAVENOUS

## 2015-11-17 MED ORDER — SODIUM CHLORIDE 0.9 % IV SOLN
INTRAVENOUS | Status: DC
  Administered 2015-11-17 – 2015-11-19 (×5): via INTRAVENOUS

## 2015-11-17 MED ORDER — POTASSIUM CHLORIDE CRYS ER 20 MEQ PO TBCR
40.0000 meq | EXTENDED_RELEASE_TABLET | Freq: Every day | ORAL | Status: DC
  Administered 2015-11-18 – 2015-11-19 (×2): 40 meq via ORAL
  Filled 2015-11-17 (×2): qty 2

## 2015-11-17 MED ORDER — PIPERACILLIN-TAZOBACTAM 3.375 G IVPB
3.3750 g | Freq: Three times a day (TID) | INTRAVENOUS | Status: DC
  Administered 2015-11-18 (×2): 3.375 g via INTRAVENOUS
  Filled 2015-11-17 (×3): qty 50

## 2015-11-17 MED ORDER — MAGNESIUM-ZINC 133.33-5 MG PO TABS: 1.0000 | ORAL_TABLET | Freq: Every day | ORAL | Status: DC

## 2015-11-17 MED ORDER — ATORVASTATIN CALCIUM 20 MG PO TABS
20.0000 mg | ORAL_TABLET | Freq: Every day | ORAL | Status: DC
  Administered 2015-11-17: 20 mg via ORAL
  Filled 2015-11-17: qty 1

## 2015-11-17 MED ORDER — ONDANSETRON HCL 4 MG PO TABS: 4.0000 mg | ORAL_TABLET | Freq: Four times a day (QID) | ORAL | Status: DC | PRN

## 2015-11-17 MED ORDER — ALLOPURINOL 300 MG PO TABS
300.0000 mg | ORAL_TABLET | Freq: Every day | ORAL | Status: DC
  Administered 2015-11-18 – 2015-11-19 (×2): 300 mg via ORAL
  Filled 2015-11-17 (×2): qty 1

## 2015-11-17 MED ORDER — ACETAMINOPHEN 325 MG PO TABS
650.0000 mg | ORAL_TABLET | Freq: Once | ORAL | Status: AC
  Administered 2015-11-17: 650 mg via ORAL
  Filled 2015-11-17: qty 2

## 2015-11-17 MED ORDER — OXYCODONE-ACETAMINOPHEN 5-325 MG PO TABS
1.0000 | ORAL_TABLET | Freq: Once | ORAL | Status: AC
  Administered 2015-11-17: 1 via ORAL
  Filled 2015-11-17: qty 1

## 2015-11-17 MED ORDER — ADULT MULTIVITAMIN W/MINERALS CH
1.0000 | ORAL_TABLET | Freq: Every day | ORAL | Status: DC
  Administered 2015-11-18 – 2015-11-19 (×2): 1 via ORAL
  Filled 2015-11-17 (×2): qty 1

## 2015-11-17 MED ORDER — ASPIRIN 325 MG PO TABS
325.0000 mg | ORAL_TABLET | Freq: Every day | ORAL | Status: DC
  Administered 2015-11-18 – 2015-11-19 (×2): 325 mg via ORAL
  Filled 2015-11-17 (×2): qty 1

## 2015-11-17 MED ORDER — VANCOMYCIN HCL IN DEXTROSE 750-5 MG/150ML-% IV SOLN
750.0000 mg | Freq: Two times a day (BID) | INTRAVENOUS | Status: DC
  Administered 2015-11-18: 750 mg via INTRAVENOUS
  Filled 2015-11-17 (×2): qty 150

## 2015-11-17 MED ORDER — PIPERACILLIN-TAZOBACTAM 3.375 G IVPB 30 MIN
3.3750 g | Freq: Once | INTRAVENOUS | Status: AC
  Administered 2015-11-17: 3.375 g via INTRAVENOUS
  Filled 2015-11-17: qty 50

## 2015-11-17 MED ORDER — ACETAMINOPHEN 325 MG PO TABS
650.0000 mg | ORAL_TABLET | Freq: Four times a day (QID) | ORAL | Status: DC | PRN
  Administered 2015-11-18 (×2): 650 mg via ORAL
  Filled 2015-11-17 (×2): qty 2

## 2015-11-17 MED ORDER — ONDANSETRON HCL 4 MG/2ML IJ SOLN: 4.0000 mg | Freq: Four times a day (QID) | INTRAMUSCULAR | Status: DC | PRN

## 2015-11-17 MED ORDER — OXYCODONE HCL 5 MG PO TABS
5.0000 mg | ORAL_TABLET | ORAL | Status: DC | PRN
  Administered 2015-11-17 – 2015-11-18 (×3): 5 mg via ORAL
  Administered 2015-11-18 – 2015-11-19 (×6): 10 mg via ORAL
  Filled 2015-11-17 (×2): qty 1
  Filled 2015-11-17: qty 2
  Filled 2015-11-17: qty 1
  Filled 2015-11-17 (×5): qty 2

## 2015-11-17 MED ORDER — IOHEXOL 350 MG/ML SOLN
100.0000 mL | Freq: Once | INTRAVENOUS | Status: AC | PRN
  Administered 2015-11-17: 80 mL via INTRAVENOUS

## 2015-11-17 MED ORDER — ISOSORBIDE MONONITRATE ER 60 MG PO TB24
60.0000 mg | ORAL_TABLET | Freq: Every morning | ORAL | Status: DC
  Administered 2015-11-18 – 2015-11-19 (×2): 60 mg via ORAL
  Filled 2015-11-17 (×2): qty 1

## 2015-11-17 MED ORDER — SENNOSIDES-DOCUSATE SODIUM 8.6-50 MG PO TABS
1.0000 | ORAL_TABLET | Freq: Two times a day (BID) | ORAL | Status: DC
  Administered 2015-11-18 – 2015-11-19 (×3): 1 via ORAL
  Filled 2015-11-17 (×3): qty 1

## 2015-11-17 MED ORDER — METOPROLOL SUCCINATE ER 100 MG PO TB24: 100.0000 mg | ORAL_TABLET | Freq: Every day | ORAL | Status: DC

## 2015-11-17 MED ORDER — SODIUM CHLORIDE 0.9 % IV BOLUS (SEPSIS)
1000.0000 mL | INTRAVENOUS | Status: AC
  Administered 2015-11-17 (×3): 1000 mL via INTRAVENOUS

## 2015-11-17 MED ORDER — SODIUM CHLORIDE 0.9 % IV BOLUS (SEPSIS): 500.0000 mL | Freq: Once | INTRAVENOUS | Status: DC

## 2015-11-17 MED ORDER — TRIAMTERENE-HCTZ 75-50 MG PO TABS: 1.0000 | ORAL_TABLET | Freq: Every day | ORAL | Status: DC

## 2015-11-17 MED ORDER — ENOXAPARIN SODIUM 40 MG/0.4ML ~~LOC~~ SOLN
40.0000 mg | SUBCUTANEOUS | Status: DC
  Administered 2015-11-17 – 2015-11-18 (×2): 40 mg via SUBCUTANEOUS
  Filled 2015-11-17 (×2): qty 0.4

## 2015-11-17 MED ORDER — ACETAMINOPHEN 650 MG RE SUPP: 650.0000 mg | Freq: Four times a day (QID) | RECTAL | Status: DC | PRN

## 2015-11-17 MED ORDER — SODIUM CHLORIDE 0.9 % IV SOLN
2000.0000 mg | Freq: Once | INTRAVENOUS | Status: AC
  Administered 2015-11-17: 2000 mg via INTRAVENOUS
  Filled 2015-11-17: qty 2000

## 2015-11-17 NOTE — ED Notes (Signed)
Pt ambulatory to restroom with steady gait. Oxygen removed. O2 sats remain 96%
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
## 2015-11-17 NOTE — Consult Note (Signed)
Pharmacy: [facility] consulted to add zosyn to vancomycin for cellulitis. Plan: zosyn 3.375 gm IV x 1 dose over 30 minutes as ordered by MD, then zosyn 3.375 gm IV q8h, infuse each dose over 4 hours  Eudelia Bunch, Pharm.D. BP:[id number] 11/17/2015 7:42 PM

## 2015-11-17 NOTE — ED Notes (Signed)
Pt stating that her arm is getting more red. EDP aware.

## 2015-11-17 NOTE — ED Notes (Signed)
Pt ambulatory to restroom
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
## 2015-11-17 NOTE — Progress Notes (Signed)
Patient arrived to unit. Alert and oriented. No signs of acute distress. MD paged to make aware of arrival to unit. Left arm red and swollen.

## 2015-11-17 NOTE — ED Notes (Signed)
Pt ambulated to restroom with slow steady gait, NAD. Pt verbalizes concern about her left arm. She states she knows she has cellulitis and wants the MD to look at it. Dr. Eulis Foster informed.

## 2015-11-17 NOTE — Progress Notes (Signed)
Report received from [place], [place] for admission to [phone]

## 2015-11-17 NOTE — ED Notes (Signed)
EDP at bedside  

## 2015-11-17 NOTE — ED Provider Notes (Signed)
[name] is a 54 y.o. female    Face-to-face evaluation   History: Patient presents for evaluation of intermittent chest pain, without provocation, for the last 24 hours. Each time the pain has been sharp in nature, and lasting about 45 minutes. During transport today, by EMS, she was treated with nitroglycerin and aspirin. She uses nitroglycerin about once a month, at home. She had a cardiac catheterization in 2014 that showed stable coronary status. She is on chronic treatment at home with a "lymphedema machine". She has pain and swelling in her left arm which may be somewhat worse today. She has had a mild cough recently that is minimally productive. She did not know that she had a fever. She did not have an immunization for influenza this year.  Physical exam: Alert, obese, somewhat anxious. Chest is tender in the left anterior portion, without crepitation. Lungs are clear anteriorly. Heart regular rate and rhythm, no murmur.  Medical screening examination/treatment/procedure(s) were conducted as a shared visit with non-physician practitioner(s) and myself.  I personally evaluated the patient during the encounter  Daleen Bo, MD 11/19/15 [phone]

## 2015-11-17 NOTE — ED Notes (Signed)
From home via [facility], CP onset yesterday - improved with rest, left arm pain today with swelling, also c/o CP today, VSS  2 nitros 325 ASA at home 20g RAC

## 2015-11-18 ENCOUNTER — Ambulatory Visit (HOSPITAL_COMMUNITY): Payer: Medicaid Other

## 2015-11-18 DIAGNOSIS — Z9889 Other specified postprocedural states: Secondary | ICD-10-CM

## 2015-11-18 LAB — TROPONIN I
Troponin I: 0.07 ng/mL — ABNORMAL HIGH (ref <0.031)
Troponin I: <0.03 ng/mL (ref <0.031)

## 2015-11-18 LAB — BASIC METABOLIC PANEL
Anion gap: 7 (ref 5–15)
BUN: 8 mg/dL (ref 6–20)
CO2: 23 mmol/L (ref 22–32)
Calcium: 8.4 mg/dL — ABNORMAL LOW (ref 8.9–10.3)
Chloride: 110 mmol/L (ref 101–111)
Creatinine, Ser: 0.91 mg/dL (ref 0.44–1.00)
GFR calc Af Amer: >60 mL/min (ref >60)
GFR calc non Af Amer: >60 mL/min (ref >60)
Glucose, Bld: 111 mg/dL — ABNORMAL HIGH (ref 65–99)
Potassium: 3.4 mmol/L — ABNORMAL LOW (ref 3.5–5.1)
Sodium: 140 mmol/L (ref 135–145)

## 2015-11-18 LAB — CBC
HEMATOCRIT: 38.9 % (ref 36.0–46.0)
Hemoglobin: 12.8 g/dL (ref 12.0–15.0)
MCH: 28.4 pg (ref 26.0–34.0)
MCHC: 32.9 g/dL (ref 30.0–36.0)
MCV: 86.3 fL (ref 78.0–100.0)
Platelets: 433 10*3/uL — ABNORMAL HIGH (ref 150–400)
RBC: 4.51 MIL/uL (ref 3.87–5.11)
RDW: 15.7 % — AB (ref 11.5–15.5)
WBC: 7.6 10*3/uL (ref 4.0–10.5)

## 2015-11-18 LAB — LIPID PANEL
Cholesterol: 131 mg/dL (ref 0–200)
HDL: 34 mg/dL — ABNORMAL LOW (ref >40)
LDL CALC: 47 mg/dL (ref 0–99)
TRIGLYCERIDES: 248 mg/dL — AB (ref <150)
Total CHOL/HDL Ratio: 3.9 RATIO
VLDL: 50 mg/dL — ABNORMAL HIGH (ref 0–40)

## 2015-11-18 LAB — TSH: TSH: 1.425 u[IU]/mL (ref 0.350–4.500)

## 2015-11-18 MED ORDER — ATORVASTATIN CALCIUM 40 MG PO TABS: 40.0000 mg | ORAL_TABLET | Freq: Every day | ORAL | Status: DC

## 2015-11-18 MED ORDER — TAMOXIFEN CITRATE 10 MG PO TABS
20.0000 mg | ORAL_TABLET | Freq: Every day | ORAL | Status: DC
  Administered 2015-11-18 – 2015-11-19 (×2): 20 mg via ORAL
  Filled 2015-11-18 (×2): qty 2

## 2015-11-18 MED ORDER — CLINDAMYCIN HCL 300 MG PO CAPS
300.0000 mg | ORAL_CAPSULE | Freq: Three times a day (TID) | ORAL | Status: DC
  Administered 2015-11-18 – 2015-11-19 (×3): 300 mg via ORAL
  Filled 2015-11-18 (×3): qty 1

## 2015-11-18 MED ORDER — CLINDAMYCIN HCL 300 MG PO CAPS: 300.0000 mg | ORAL_CAPSULE | Freq: Three times a day (TID) | ORAL | Status: DC

## 2015-11-18 MED ORDER — ATORVASTATIN CALCIUM 40 MG PO TABS
40.0000 mg | ORAL_TABLET | Freq: Every day | ORAL | Status: DC
  Administered 2015-11-18: 40 mg via ORAL
  Filled 2015-11-18: qty 1

## 2015-11-18 NOTE — Progress Notes (Signed)
OT Cancellation Note  Patient Details Name: [name] MRN: [id number] DOB: Sep 07, 1961   Cancelled Treatment:    Reason Eval/Treat Not Completed: OT screened, no needs identified, will sign off. According to PT, pt is independent and at baseline. Please send text page to OT services if any questions, concerns, or with new orders: [phone] OR call office at [phone]. Thank you for the order.    Shondell Fabel , MS, OTR/L, CLT Pager: [phone]  11/18/2015, 2:31 PM

## 2015-11-18 NOTE — Care Management Note (Addendum)
Case Management Note  Patient Details  Name: [name] MRN: [id number] Date of Birth: Apr 07, 1961  Subjective/Objective:     Date:  11/18/15 Spoke with patient at the bedside.  Introduced self as Tourist information centre manager and explained role in discharge planning and how to be reached.  Verified patient lives in town,  with fiance.  Has DME cane at home.  Expressed potential need for no other DME.  Verified patient anticipates to go home with  Fiance at time of discharge and will have part-time supervision at this time to best of their knowledge.  Patient  denied needing help with their medication.  Patient  is driven by  fiance to MD appointments.  Verified patient has PCP Dr. Gerlean Ren.  Await pt/ot eval.  Patient has an aide that is with here 2-4 hrs per day Monday thru Sunday.    Plan: CM will continue to follow for discharge planning and [facility] resources.                Action/Plan:   Expected Discharge Date:                  Expected Discharge Plan:  [place]  In-House Referral:     Discharge planning Services  CM Consult  Post Acute Care Choice:    Choice offered to:     DME Arranged:    DME Agency:     HH Arranged:    [place] Agency:     Status of Service:  In process, will continue to follow  Medicare Important Message Given:    Date Medicare IM Given:    Medicare IM give by:    Date Additional Medicare IM Given:    Additional Medicare Important Message give by:     If discussed at [place] of Stay Meetings, dates discussed:    Additional Comments:  Wondra, Scurry, RN 11/18/2015, 11:51 AM

## 2015-11-18 NOTE — Evaluation (Signed)
Physical Therapy Evaluation Patient Details Name: [name] MRN: [id number] DOB: [birth year] Today's Date: 11/18/2015   History of Present Illness  Pt adm with LUE cellulitis. PMH - lymphadema, bil mastectomy, htn, cad, STEMI  Clinical Impression  Pt doing well with mobility and no further PT needed.  Ready for dc from PT standpoint.      Follow Up Recommendations No PT follow up    Equipment Recommendations  None recommended by PT    Recommendations for Other Services       Precautions / Restrictions Precautions Precautions: None      Mobility  Bed Mobility Overal bed mobility: Independent                Transfers Overall transfer level: Independent                  Ambulation/Gait Ambulation/Gait assistance: Independent Ambulation Distance (Feet): 700 Feet Assistive device: None Gait Pattern/deviations: WFL(Within Functional Limits)   Gait velocity interpretation: at or above normal speed for age/gender General Gait Details: Steady gait  Stairs            Wheelchair Mobility    Modified Rankin (Stroke Patients Only)       Balance Overall balance assessment: No apparent balance deficits (not formally assessed)                                           Pertinent Vitals/Pain      Home Living Family/patient expects to be discharged to:: Private residence Living Arrangements: Spouse/significant other Available Help at Discharge: Family;Available PRN/intermittently Type of Home: Apartment Home Access: Stairs to enter Entrance Stairs-Rails: Chemical engineer of Steps: 12 Home Layout: One level Home Equipment: Cane - single point      Prior Function Level of Independence: Independent               Hand Dominance   Dominant Hand: Right    Extremity/Trunk Assessment   Upper Extremity Assessment: Overall WFL for tasks assessed (LUE guarded due to cellulitis)           Lower  Extremity Assessment: Overall WFL for tasks assessed         Communication   Communication: No difficulties  Cognition Arousal/Alertness: Awake/alert Behavior During Therapy: WFL for tasks assessed/performed Overall Cognitive Status: Within Functional Limits for tasks assessed                      General Comments      Exercises        Assessment/Plan    PT Assessment Patent does not need any further PT services  PT Diagnosis Difficulty walking   PT Problem List    PT Treatment Interventions     PT Goals (Current goals can be found in the Care Plan section) Acute Rehab PT Goals PT Goal Formulation: All assessment and education complete, DC therapy    Frequency     Barriers to discharge        Co-evaluation               End of Session   Activity Tolerance: Patient tolerated treatment well Patient left: in bed;with call bell/phone within reach Nurse Communication: Mobility status         Time: 1112-1127 PT Time Calculation (min) (ACUTE ONLY): 15 min   Charges:   PT Evaluation $  Initial PT Evaluation Tier I: 1 Procedure     PT G Codes:        Joon Pohle 2015-12-13, 2:01 PM [facility] PT [phone]

## 2015-11-18 NOTE — Progress Notes (Signed)
Preliminary results by tech - Left Upper Ext. Venous Duplex Completed. Negative for deep or superficial vein thrombosis in the left upper extremity. Oda Cogan, BS, RDMS, RVT

## 2015-11-18 NOTE — Progress Notes (Signed)
Family Medicine Teaching Service Daily Progress Note Intern Pager: [phone]  Patient name: [name] Medical record number: [id number] Date of birth: 1961/01/20 Age: 54 y.o. Gender: female  Primary Care Provider: Junie Panning, DO Consultants: None Code Status: Full  Pt Overview and Major Events to Date:  11/28: Admitted to the FMTS  Assessment and Plan: [name] is a 54 y.o. female presenting with swelling and erythema of left arm. 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   Swelling/erythema of left arm, improved: History and exam are most consistent with cellulitis. Met SIRS criteria on admission with a fever to 101.7 and a WBC count of 13.5. Lactic acidosis normalized with IVF. Some concern for left upper extremity DVT, given her unilateral swelling in setting of h/o cancer and chronic lymphedema. Could also be thrombophlebitis. Pt was afebrile overnight. WBC 7.6. Erythema has markedly improved this morning. - Vanc/Zosyn per cellulitis protocol, will transition to PO Clindamycin this morning. Pt was on this for her last cellulitis and she states that it worked really well for her. - LUE duplex u/s to rule out LUE DVT - will need to initiate treatment dose anticoagulation if positive - Blood cultures are pending. - Tylenol and OxyIR prn for pain control. - Will keep arm elevated as much as possible. - PT/OT consults  Atypical chest pain: Associated with SOB. Pt has CAD with a history of MI s/p 2 stents in 2014. Chest pain is dull and radiates to her left scapula. Concern for ACS, given her cardiac history. Heart score is a 4. EKG showing sinus rhythm with a negative i-stat troponin. Concern for PE in the ED. D-dimer was elevated at 0.65. CTA chest was negative for PE. Pneumonia is less likely, as there are no infiltrates on CXR or CTA and lungs are clear on exam. Possibly  MSK-related given increased swelling of LUE. - Continue home meds: Imdur 57m daily, Metoprolol 1035mdaily - Troponins: <0.03 > 0.07 > <0.03 - EKG this morning is unchanged from previous. - Risk stratification labs in the am- TSH 1.425; lipid panel: Chol 131, TG 248, HDL 34, LDL 47; HgbA1c pending. - Cardiac monitoring  Breast cancer s/p bilateral mastectomy with residual lymphedema: Diagnosed in 2012. - Will hold Tamoxifen in the setting of possible DVT, as it can increase the risk of blood clots. Will resume pending doppler.  Hypokalemia, mild: K 3.4 this morning.  - Will continue home K-Dur 2052m - Monitor with daily BMETs  HTN: Pt had a BP down to 80/55 in the ED, improved to the [id number]. - Continue home Metoprolol  - Holding home Triamterene-HCTZ - will restart if her blood pressures increase.  HLD: Last lipid panel in 2012: Chol 154, TG 117, HDL 45, LDL 86. - Repeat lipid panel: Chol 131, TG 248, HDL 34, LDL 47 - ASCVD risk score is 8.1%.  - Will increase Lipitor 64m108mily to 40mg24mly.  Tobacco abuse: Pt currently smokes 4-7 cigarettes per day. She states she is trying to quit. - Pt declined nicotine patch  History of gout: Stable. No evidence of gout flare currently. - Continue home med: Allopurinol 300mg 24my  Chronic pain: Pt takes Methadone at home for chronic pain. She only uses this when she needs it and has been out for several days - Will hold home Methadone - Watch for signs of Methadone withdrawal - suspect there  is low likelihood of this happening as patient only takes prn and not in last several days - Oxy IR as above  FEN/GI: On MIVFs: NS @ 169m/hr overnight, will SLIV today, Senokot-S for bowel regimen Prophylaxis: Lovenox  Disposition: Anticipate discharge home in 2-3 days, pending clinical improvement and transition to PO abx.  Subjective:  Pt is doing really well this morning. She denies any chest pain. She feels like her cellulitis  has gotten much better. No other concerns.  Objective: Temp:  [98.2 F (36.8 C)-101.7 F (38.7 C)] 99.1 F (37.3 C) (11/29 0550) Pulse Rate:  [76-90] 82 (11/29 0550) Resp:  [12-32] 20 (11/29 0550) BP: (80-137)/(55-96) 123/56 mmHg (11/29 0550) SpO2:  [91 %-100 %] 100 % (11/29 0550) Weight:  [228 lb (103.42 kg)-236 lb 8.9 oz (107.3 kg)] 228 lb (103.42 kg) (11/28 1852) Physical Exam: General: Well-appearing, pleasant and conversational, in NAD HEENT: [place]/AT, EOMI, MMM, OP clear, MMM Neck: Supple Cardiovascular: RRR, no murmurs, 2+ DP pulses  Respiratory: CTAB, no wheezes, normal work of breathing Abdomen: +BS, soft, non-tender, non-distended MSK: Moves all 4 extremities spontaneously, no edema of the lower extremities; left upper extremity is edematous, but erythema is markedly improved. Left upper extremity is no longer warm to touch. Skin: No rashes or lesions Neuro: Awake, alert, oriented, CN 2-12 grossly intact, no focal deficits Psych: Normal mood and affect  Laboratory:  Recent Labs Lab 11/17/15 1058  WBC 13.5*  HGB 13.9  HCT 41.5  PLT 455*    Recent Labs Lab 11/12/15 1009 11/17/15 1058  NA  --  137  K  --  3.6  CL  --  103  CO2  --  24  BUN  --  12  CREATININE 1.05* 1.13*  CALCIUM  --  9.1  GLUCOSE  --  101*   I-stat troponin 0.01 > 0.00 BNP 125.1 Lactic acid 2.56 > 1.18 D-dimer 0.65 Blood cultures pending Influenza negative  Imaging/Diagnostic Tests: CXR (11/28): Stable cardiomegaly, pulmonary vascular congestion and mild chronic interstitial lung disease   CTA chest (11/28): No evidence of PE.  EKG (11/28): NSR, nonischemic, unchanged from previous  KSela Hua MD 11/18/2015, 7:16 AM PGY-1, [facility] pager: [phone] text pages welcome

## 2015-11-18 NOTE — Discharge Summary (Signed)
[facility] Discharge Summary  Patient name: [name] Medical record number: [id number] Date of birth: 05-04-1961 Age: 54 y.o. Gender: female Date of Admission: 11/17/2015  Date of Discharge: 11/19/15 Admitting Physician: Lupita Dawn, MD  Primary Care Provider: Junie Panning, DO Consultants: None  Indication for Hospitalization: Left upper extremity cellulitis  Discharge Diagnoses/Problem List:  LUE cellulitis, resolved CAD HTN HLD Tobacco abuse Hx of breast cancer s/p bilateral mastectomies with residual lymphedema  Disposition: Home  Discharge Condition: Stable, improved  Discharge Exam:  General: Well-appearing, sitting up in bed, pleasant and conversational, in NAD HEENT: [place]/AT, EOMI, MMM Neck: Supple Cardiovascular: RRR, no murmurs, 2+ radial pulse in the LUE.  Respiratory: CTAB, no wheezes, normal work of breathing Abdomen: +BS, soft, non-tender, non-distended MSK: Moves all 4 extremities spontaneously, no edema of the lower extremities; left upper extremity is edematous, but erythema has resolved. Left upper extremity is no longer warm to touch. Skin: No rashes or lesions Neuro: Awake, alert, oriented, CN 2-12 grossly intact, no focal deficits Psych: Normal mood and affect  Brief Hospital Course:  [name] is a 54 year old female with a PMH of breast cancer s/p bilateral mastectomies with residual left upper extremity lymphedema who presented to the ED with left upper extremity cellulitis and dull chest pain that radiated to her left scapula. The chest pain was worse with deep breaths and was not associated exertion. Hospital course is described by problem list below.   1. Left upper extremity cellulitis: In the ED, Pt was meeting SIRS criteria with a fever to 101.7 and a WBC count of 13.5. She had initial lactic acidosis, which resolved with IVFs. Blood cultures were drawn and ultimately showed no growth. On admission, she  was started on Vanc/Zosyn and treated for pain with Tylenol and OxyIR. There was some concern for left upper extremity DVT, given her unilateral swelling and her history of cancer and chronic lymphedema. DVT was ruled out with a negative duplex ultrasound. She received IV Vanc/Zosyn for 16 hours and her cellulitis markedly improved. She was then switched to PO Clindamycin, which she tolerated without any issues. She was discharged to finish out a 10 day antibiotic course. She also requested a few days worth of Vicodin for pain, so we gave her 12 pills.  2. Atypical chest pain, resolved: Pt has a history of MI s/p 2 stents. Her chest pain radiated to her left scapula and was worse with deep inspirations. In the ED, she had a negative i-stat troponin and her EKG showed sinus rhythm. Her D-dimer was elevated at 0.65 and CTA chest was negative for PE. We trended her troponins, which were <0.03, 0.07, <0.03. We also got risk stratification labs. TSH was normal. HgbA1c was 6.4%. Lipid panel showed Chol 131, TG 248, HDL 34, and LDL 47. We increased her Lipitor from 20mg  daily to 40mg  daily.  3. History of breast cancer: She was diagnosed in 2012. We initially held her Tamoxifen in the setting of possible DVT, as it can increase the risk of blood clots. We resumed this medication after duplex u/s was negative for DVT.  4. HTN: In the ED, she initially had a BP down to 80/55, which improved to 130/92 after IVFs. We initially held her home Triamterene-HCTZ, but this was restarted on discharge because her blood pressures improved.  Issues for Follow Up:  1. We increased her Lipitor from 20mg  daily to 40mg  daily, given her history of MI. Please make  sure she is tolerating this.  2. Pt is currently smoking 4-7 cigarettes per day and states she wants to quit. Recommend continued smoking cessation counseling as a outpatient. 3. Pt's HgbA1c was 6.4%. Recommend counseling on lifestyle modifications. Can consider starting  Metformin as an outpatient.  Significant Procedures: None  Significant Labs and Imaging:   Recent Labs Lab 11/17/15 1058 11/18/15 0815  WBC 13.5* 7.6  HGB 13.9 12.8  HCT 41.5 38.9  PLT 455* 433*    Recent Labs Lab 11/12/15 1009 11/17/15 1058 11/18/15 0815  NA  --  137 140  K  --  3.6 3.4*  CL  --  103 110  CO2  --  24 23  GLUCOSE  --  101* 111*  BUN  --  12 8  CREATININE 1.05* 1.13* 0.91  CALCIUM  --  9.1 8.4*   I-stat troponin 0.01 > 0.00 BNP 125.1 Lactic acid 2.56 > 1.18 D-dimer 0.65 Blood cultures pending Influenza negative  CXR (11/28): Stable cardiomegaly, pulmonary vascular congestion and mild chronic interstitial lung disease  CTA chest (11/28): No evidence of PE. EKG (11/28): NSR, nonischemic, unchanged from previous  Results/Tests Pending at Time of Discharge: None  Discharge Medications:    Medication List    TAKE these medications        allopurinol 300 MG tablet  Commonly known as:  ZYLOPRIM  Take 1 tablet (300 mg total) by mouth daily.     aspirin 325 MG tablet  Take 325 mg by mouth daily.     atorvastatin 40 MG tablet  Commonly known as:  LIPITOR  Take 1 tablet (40 mg total) by mouth daily at 6 PM.     clindamycin 300 MG capsule  Commonly known as:  CLEOCIN  Take 1 capsule (300 mg total) by mouth 3 (three) times daily.     ibuprofen 200 MG tablet  Commonly known as:  ADVIL,MOTRIN  Take 400 mg by mouth every 6 (six) hours as needed (for pain.).     isosorbide mononitrate 60 MG 24 hr tablet  Commonly known as:  IMDUR  Take 1 tablet (60 mg total) by mouth every morning.     MAGNESIUM-ZINC PO  Take 1 tablet by mouth daily.     methadone 5 MG tablet  Commonly known as:  DOLOPHINE  Take 3 tablets (15 mg total) by mouth every 8 (eight) hours. This prescription can be refilled on 10/01/15.     metoprolol succinate 100 MG 24 hr tablet  Commonly known as:  TOPROL-XL  Take 1 tablet (100 mg total) by mouth daily. Take with or  immediately following a meal.     multivitamin with minerals Tabs tablet  Take 1 tablet by mouth daily.     potassium chloride SA 20 MEQ tablet  Commonly known as:  K-DUR,KLOR-CON  TAKE 2 TABLETS (40 MEQ TOTAL) BY MOUTH DAILY.     tamoxifen 20 MG tablet  Commonly known as:  NOLVADEX  TAKE 1 TABLET (20 MG TOTAL) BY MOUTH DAILY.     triamterene-hydrochlorothiazide 75-50 MG tablet  Commonly known as:  MAXZIDE  Take 1 tablet by mouth daily.        Discharge Instructions: Please refer to Patient Instructions section of EMR for full details.  Patient was counseled important signs and symptoms that should prompt return to medical care, changes in medications, dietary instructions, activity restrictions, and follow up appointments.   Follow-Up Appointments: Follow-up Information    Follow up with [facility],  DO On 11/21/2015.   Specialty:  Family Medicine   Why:  hospital follow-up appointment at 9:30am.   Contact information:   [id number] [place] [address] [place], MD 11/18/2015, 9:01 PM PGY-1, [place]

## 2015-11-18 NOTE — Discharge Instructions (Signed)
You were hospitalized for a skin infection of your left arm. We started you on IV antibiotics and your cellulitis improved. We then switched you over to antibiotics in the form of a pill (Clindamycin). Please continue to take the antibiotics three times a day for 8 more days. You should finish the antibiotic course on December 7th.   You were also having some chest pain. We did some labs and imaging to make sure your heart was not being injured. All of the labs and imaging was normal. We have increased your Lipitor from 20mg  daily to 40mg  daily to help prevent heart attacks and strokes in the future.

## 2015-11-19 LAB — HEMOGLOBIN A1C
Hgb A1c MFr Bld: 6.4 % — ABNORMAL HIGH (ref 4.8–5.6)
Mean Plasma Glucose: 137 mg/dL

## 2015-11-19 MED ORDER — NICOTINE POLACRILEX 2 MG MT LOZG: 2.0000 mg | LOZENGE | OROMUCOSAL | Status: DC | PRN

## 2015-11-19 MED ORDER — NITROGLYCERIN 0.4 MG SL SUBL: 0.4000 mg | SUBLINGUAL_TABLET | SUBLINGUAL | Status: DC | PRN

## 2015-11-19 MED ORDER — HYDROCODONE-ACETAMINOPHEN 10-300 MG PO TABS: 1.0000 | ORAL_TABLET | Freq: Four times a day (QID) | ORAL | Status: DC | PRN

## 2015-11-19 NOTE — Progress Notes (Signed)
Family Medicine Teaching Service Daily Progress Note Intern Pager: [phone]  Patient name: RAYONA LOVEDAY Medical record number: [id number] Date of birth: 07-31-1961 Age: 54 y.o. Gender: female  Primary Care Provider: Junie Panning, DO Consultants: None Code Status: Full  Pt Overview and Major Events to Date:  11/28: Admitted to the FMTS  Assessment and Plan: SHRONDA OLDFATHER is a 54 y.o. female presenting with swelling and erythema of left arm. 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   LUE cellulitis: Chronic lymphedema is a likely contributor. Pt was afebrile overnight and is feeling well overall. No erythema is present this morning. - Pt received Vanc/Zosyn x 16 hours, now on PO Clindamycin tid for total 10 day course. - Blood cultures show no growth x 1 day. - Tylenol and OxyIR prn for pain control. Requesting Vicodin for the next few days at home. - Will keep arm elevated as much as possible. - PT/OT recommending no additional follow-up.  Atypical chest pain, resolved: Pt has CAD with a history of MI s/p 2 stents in 2014. EKG showing sinus rhythm with a negative i-stat troponin. CTA chest was negative for PE. Pt has not had any more chest pain since admission. - Continue home meds: Imdur 60mg  daily, Metoprolol 100mg  daily - Troponins: <0.03 > 0.07 > <0.03 - Risk stratification labs- TSH 1.425; lipid panel: Chol 131, TG 248, HDL 34, LDL 47; HgbA1c is 6.4%.  Breast cancer s/p bilateral mastectomy with residual lymphedema: Diagnosed in 2012. - Continue home med: Tamoxifen  Hypokalemia, mild: K 3.4 yesterday morning. - Will continue home K-Dur 64mEq. - Monitor with daily BMETs  HTN: BPs ranging from 120-162/67-89. - Continue home Metoprolol  - Have been holding home Triamterene-HCTZ for low blood pressures in the ED, will resume on discharge.  HLD: Last lipid panel in  2012: Chol 154, TG 117, HDL 45, LDL 86. - Repeat lipid panel: Chol 131, TG 248, HDL 34, LDL 47 - Lipitor 40mg  daily.  Tobacco abuse: Pt currently smokes 4-7 cigarettes per day. She states she would like to quit and does not plan on continuing to smoke when she goes home. Her and her fiance have decided to quit together. - Pt declined nicotine patch  History of gout: Stable. No evidence of gout flare currently. - Continue home med: Allopurinol 300mg  daily  Chronic pain: Pt takes Methadone at home for chronic pain, which is prescribed by her oncologist. She only uses this when she needs it and has been out for several days - Will hold home Methadone - Watch for signs of Methadone withdrawal - suspect there is low likelihood of this happening as patient only takes prn and not in last several days - Oxy IR as above  FEN/GI: Heart healthy diet, SLIV today, Senokot-S for bowel regimen Prophylaxis: Lovenox  Disposition: Discharge home today.  Subjective:  Pt is doing well this morning. She feels completely back to normal. She would like to quit smoking and does not plan on continuing to smoke on discharge. Pt and her fiance have decided to quit together.   Objective: Temp:  [98 F (36.7 C)-98.5 F (36.9 C)] 98 F (36.7 C) (11/30 0508) Pulse Rate:  [73-76] 73 (11/30 0508) Resp:  [16-20] 16 (11/30 0508) BP: (120-162)/(67-89) 146/79 mmHg (11/30 0508) SpO2:  [96 %-100 %] 96 % (11/30 0508) Physical Exam: General: Well-appearing, sitting up in bed, pleasant and  conversational, in NAD HEENT: [place]/AT, EOMI, MMM Neck: Supple Cardiovascular: RRR, no murmurs, 2+ radial pulse in the LUE.  Respiratory: CTAB, no wheezes, normal work of breathing Abdomen: +BS, soft, non-tender, non-distended MSK: Moves all 4 extremities spontaneously, no edema of the lower extremities; left upper extremity is edematous, but erythema has resolved. Left upper extremity is no longer warm to touch. Skin: No rashes or  lesions Neuro: Awake, alert, oriented, CN 2-12 grossly intact, no focal deficits Psych: Normal mood and affect  Laboratory:  Recent Labs Lab 11/17/15 1058 11/18/15 0815  WBC 13.5* 7.6  HGB 13.9 12.8  HCT 41.5 38.9  PLT 455* 433*    Recent Labs Lab 11/12/15 1009 11/17/15 1058 11/18/15 0815  NA  --  137 140  K  --  3.6 3.4*  CL  --  103 110  CO2  --  24 23  BUN  --  12 8  CREATININE 1.05* 1.13* 0.91  CALCIUM  --  9.1 8.4*  GLUCOSE  --  101* 111*   I-stat troponin 0.01 > 0.00 BNP 125.1 Lactic acid 2.56 > 1.18 D-dimer 0.65 Blood cultures pending Influenza negative HgbA1c: 6.4% Lipid panel: Chol 131, TG 248, HDL 34, LDL 47  Imaging/Diagnostic Tests: CXR (11/28): Stable cardiomegaly, pulmonary vascular congestion and mild chronic interstitial lung disease   CTA chest (11/28): No evidence of PE.  EKG (11/28): NSR, nonischemic, unchanged from previous  Sela Hua, MD 11/19/2015, 7:20 AM PGY-1, [place] Intern pager: [phone], text pages welcome

## 2015-11-19 NOTE — Care Management Note (Signed)
Case Management Note  Patient Details  Name: [name] MRN: [id number] Date of Birth: 18-Jan-1961  Subjective/Objective:   Patient for dc today, no needs.                 Action/Plan:   Expected Discharge Date:                  Expected Discharge Plan:  Home/Self Care  In-House Referral:     Discharge planning Services  CM Consult  Post Acute Care Choice:    Choice offered to:     DME Arranged:    DME Agency:     HH Arranged:    [place] Agency:     Status of Service:  Completed, signed off  Medicare Important Message Given:    Date Medicare IM Given:    Medicare IM give by:    Date Additional Medicare IM Given:    Additional Medicare Important Message give by:     If discussed at [place] of Stay Meetings, dates discussed:    Additional Comments:  Zenon Mayo, RN 11/19/2015, 3:05 PM

## 2015-11-21 ENCOUNTER — Inpatient Hospital Stay: Payer: Medicaid Other | Admitting: Family Medicine

## 2015-11-22 LAB — CULTURE, BLOOD (ROUTINE X 2)
CULTURE: NO GROWTH
CULTURE: NO GROWTH

## 2015-11-24 ENCOUNTER — Inpatient Hospital Stay: Payer: Medicaid Other | Admitting: Student

## 2015-11-24 ENCOUNTER — Telehealth: Payer: Self-pay | Admitting: *Deleted

## 2015-11-24 NOTE — Telephone Encounter (Signed)
Patient would like to pick up her methadone refill on 11/27/15. Message sent to [employer].

## 2015-11-25 ENCOUNTER — Other Ambulatory Visit: Payer: Self-pay

## 2015-11-25 ENCOUNTER — Ambulatory Visit (HOSPITAL_BASED_OUTPATIENT_CLINIC_OR_DEPARTMENT_OTHER): Payer: Medicaid Other | Attending: Internal Medicine

## 2015-11-25 DIAGNOSIS — M792 Neuralgia and neuritis, unspecified: Secondary | ICD-10-CM

## 2015-11-25 DIAGNOSIS — Z853 Personal history of malignant neoplasm of breast: Secondary | ICD-10-CM

## 2015-11-25 DIAGNOSIS — I10 Essential (primary) hypertension: Secondary | ICD-10-CM

## 2015-11-25 DIAGNOSIS — G8929 Other chronic pain: Secondary | ICD-10-CM

## 2015-11-25 MED ORDER — METHADONE HCL 5 MG PO TABS: 15.0000 mg | ORAL_TABLET | Freq: Three times a day (TID) | ORAL | Status: DC

## 2015-11-27 ENCOUNTER — Other Ambulatory Visit: Payer: Medicaid Other

## 2015-11-28 ENCOUNTER — Encounter: Payer: Self-pay | Admitting: Family Medicine

## 2015-11-28 ENCOUNTER — Ambulatory Visit (INDEPENDENT_AMBULATORY_CARE_PROVIDER_SITE_OTHER): Payer: Medicaid Other | Admitting: Family Medicine

## 2015-11-28 VITALS — BP 100/60 | HR 74 | Temp 98.7°F | Ht 65.0 in | Wt 232.0 lb

## 2015-11-28 DIAGNOSIS — D75839 Thrombocytosis, unspecified: Secondary | ICD-10-CM

## 2015-11-28 DIAGNOSIS — K0889 Other specified disorders of teeth and supporting structures: Secondary | ICD-10-CM | POA: Diagnosis not present

## 2015-11-28 DIAGNOSIS — L03114 Cellulitis of left upper limb: Secondary | ICD-10-CM | POA: Diagnosis not present

## 2015-11-28 DIAGNOSIS — R413 Other amnesia: Secondary | ICD-10-CM

## 2015-11-28 DIAGNOSIS — D473 Essential (hemorrhagic) thrombocythemia: Secondary | ICD-10-CM | POA: Diagnosis not present

## 2015-11-28 LAB — CBC
HCT: 44.3 % (ref 36.0–46.0)
Hemoglobin: 14.8 g/dL (ref 12.0–15.0)
MCH: 28.5 pg (ref 26.0–34.0)
MCHC: 33.4 g/dL (ref 30.0–36.0)
MCV: 85.2 fL (ref 78.0–100.0)
MPV: 9.9 fL (ref 8.6–12.4)
PLATELETS: 648 10*3/uL — AB (ref 150–400)
RBC: 5.2 MIL/uL — AB (ref 3.87–5.11)
RDW: 15.9 % — ABNORMAL HIGH (ref 11.5–15.5)
WBC: 10.9 10*3/uL — ABNORMAL HIGH (ref 4.0–10.5)

## 2015-11-28 MED ORDER — ACETAMINOPHEN 500 MG PO TABS
1000.0000 mg | ORAL_TABLET | Freq: Once | ORAL | Status: AC
  Administered 2015-11-28: 1000 mg via ORAL

## 2015-11-28 MED ORDER — CLINDAMYCIN HCL 300 MG PO CAPS: 300.0000 mg | ORAL_CAPSULE | Freq: Three times a day (TID) | ORAL | Status: DC

## 2015-11-28 NOTE — Patient Instructions (Addendum)
Thank you so much for coming to visit me today! I have extended your course of antibiotics for another five days. Please ask your oncologist about memory loss associated with chemotherapy. Please follow up in two months so we can recheck your A1C, which is an estimate of what your blood sugar has been over the last three months. This was slightly elevated in the hospital, but not enough to be diagnostic of diabetes. Please continue to work on your diet and exercise to lower this.  Gastroenterology should contact you again for colonoscopy.  Thanks again! Dr. Gerlean Ren

## 2015-12-01 ENCOUNTER — Other Ambulatory Visit: Payer: Self-pay

## 2015-12-01 ENCOUNTER — Encounter: Payer: Self-pay | Admitting: Family Medicine

## 2015-12-01 DIAGNOSIS — C50912 Malignant neoplasm of unspecified site of left female breast: Secondary | ICD-10-CM

## 2015-12-01 DIAGNOSIS — C50911 Malignant neoplasm of unspecified site of right female breast: Secondary | ICD-10-CM

## 2015-12-01 NOTE — Assessment & Plan Note (Addendum)
-   Workup normal - Suspect component of hypochondriasis - Discuss with oncology given history of chemotherapy

## 2015-12-01 NOTE — Assessment & Plan Note (Signed)
-   Will extend course of Clindamycin by an additional five days given acute worsening reported yesterday - Will recheck CBC today

## 2015-12-01 NOTE — Progress Notes (Signed)
Subjective:     Patient ID: [name], female   DOB: Aug 16, 1961, 54 y.o.   MRN: [id number]  HPI [name] is a 54yo female presenting today for hospital follow up. - Hospitalized from 11/28-11/30 for left upper extremity cellulitis. Initially prescribed vancomycin/zosyn, but transitioned to clindamycin to complete ten day total course of antibiotics. Discharged with Vicodin #12. - Note takes methadone per Oncology. Requests refill of Vicodin today. - Notes some improvement of cellulitis. States she noted acute worsening of redness yesterday, however this has improved today. - Would like CBC to be rechecked today - Denies fever - Continues to note concerns with memory loss  Review of Systems Per HPI    Objective:   Physical Exam  Constitutional: She appears well-developed and well-nourished. No distress.  Cardiovascular: Normal rate and regular rhythm.  Exam reveals no gallop and no friction rub.   No murmur heard. Pulmonary/Chest: Effort normal. No respiratory distress. She has no wheezes. She has no rales.  Skin:  No erythema or warmth noted       Assessment and Plan:     Cellulitis of left upper extremity - Will extend course of Clindamycin by an additional five days given acute worsening reported yesterday - Will recheck CBC today  Memory loss - Workup normal - Suspect component of hypochondriasis - Discuss with oncology given history of chemotherapy

## 2015-12-02 ENCOUNTER — Other Ambulatory Visit: Payer: Medicaid Other

## 2015-12-02 ENCOUNTER — Ambulatory Visit: Payer: Medicaid Other | Admitting: Oncology

## 2015-12-02 ENCOUNTER — Telehealth: Payer: Self-pay | Admitting: *Deleted

## 2015-12-02 ENCOUNTER — Telehealth: Payer: Self-pay | Admitting: Oncology

## 2015-12-02 NOTE — Telephone Encounter (Signed)
Patient called in to cancel todays  appt as she has a tooth ache,her appt has been rescheduled

## 2015-12-02 NOTE — Telephone Encounter (Signed)
Patient called reporting "Face is breaking out.  I wonder if it's because my body isn't getting the fluid out.  When can I resume my body machine that massages for lymphedema?  Currently on antibiotic Clindamycin.  Call [phone].  I cancelled today's appointment after recent discharged from hospital, Cellulitis, can't go outside.  I do have a dentist appointment Thursday."

## 2015-12-04 NOTE — Telephone Encounter (Signed)
Val, she cna come in and see Retta Mac or see her primary-- can't evaluate a rash by phone  Thanks!

## 2015-12-04 NOTE — Telephone Encounter (Signed)
Second request for advice with swelling and rash.  Has completed antibiotic.

## 2015-12-04 NOTE — Telephone Encounter (Signed)
This RN attempted to contact pt and obtained identified VM.  Detailed message left informing pt of need to follow up with primary MD per rash due to recent visit and their office prescription clindamycin.  Per concern with lymphedema and use of her pneumatic sleeve pump- order will be placed for PT consult for evaluation and appropriate recommendations.  This RN's name and office return call number left.

## 2015-12-08 ENCOUNTER — Telehealth: Payer: Self-pay | Admitting: Family Medicine

## 2015-12-08 DIAGNOSIS — G8929 Other chronic pain: Secondary | ICD-10-CM

## 2015-12-08 DIAGNOSIS — Z853 Personal history of malignant neoplasm of breast: Secondary | ICD-10-CM

## 2015-12-08 DIAGNOSIS — M792 Neuralgia and neuritis, unspecified: Secondary | ICD-10-CM

## 2015-12-08 DIAGNOSIS — I10 Essential (primary) hypertension: Secondary | ICD-10-CM

## 2015-12-08 MED ORDER — ISOSORBIDE MONONITRATE ER 60 MG PO TB24
60.0000 mg | ORAL_TABLET | Freq: Every morning | ORAL | Status: DC
Start: 2015-12-08 — End: 2016-12-09

## 2015-12-08 NOTE — Telephone Encounter (Signed)
Needs refill on her isorosbide. She has been out for 4 days and her chest is feeling funny. [facility] four days to get [facility] on [address]

## 2015-12-08 NOTE — Telephone Encounter (Signed)
Refill given. Denies chest pain or shortness of breath. States she was more anxious this morning since she did not have her medication and that was why she called. Lab results given verbally and letter containing results already mailed on 12/02/15.

## 2015-12-08 NOTE — Telephone Encounter (Signed)
Would like results from last blood work. Is it ok?

## 2015-12-09 ENCOUNTER — Other Ambulatory Visit: Payer: Self-pay | Admitting: Family Medicine

## 2015-12-09 NOTE — Telephone Encounter (Signed)
Pt called and would like a refill on her Clindamycin called in. jw

## 2015-12-09 NOTE — Telephone Encounter (Signed)
She will need to be seen for Clindamycin to be given. We don't normally refill antibiotics over the phone and should be evaluated if she thinks her cellulitis has returned.

## 2015-12-10 ENCOUNTER — Other Ambulatory Visit: Payer: Self-pay | Admitting: Family Medicine

## 2015-12-10 MED ORDER — MAGNESIUM-ZINC 133.33-5 MG PO TABS: 1.0000 | ORAL_TABLET | Freq: Every day | ORAL | Status: DC

## 2015-12-10 NOTE — Telephone Encounter (Signed)
Refill sent. I have place a future order to check her Magnesium. She may start it, but I would like for her to schedule a lab appointment at her convenience over the next 2-4 weeks so we can check where her magnesium is at.

## 2015-12-10 NOTE — Telephone Encounter (Signed)
Slo-Mag is for low magnesium and will not help her platelets. Her elevated platelets is suspected to be due to stress reaction from previous hospitalization. This will take time to come back down. She has had this reaction in the past due to stress and they were in the process of trending down nicely before she was recently hospitalized again. We will monitor for now like we did the last time this happened.

## 2015-12-10 NOTE — Telephone Encounter (Signed)
LVM for pt to call back to inform her of below. Zimmerman Rumple, April D, CMA  

## 2015-12-10 NOTE — Telephone Encounter (Signed)
Contacted pt and she stated that she had been given the information below and then she said that she was wondering about getting SlowMag.  She stated that when she took it it really helped and made her feel good.  Told her I would send message to the PCP and see if that was something that they could do or not. Katharina Caper, April D, [place]

## 2015-12-10 NOTE — Addendum Note (Signed)
Addended by: Lorna Few on: 12/10/2015 04:03 PM   Modules accepted: Orders

## 2015-12-10 NOTE — Telephone Encounter (Signed)
Pt goes to the dentist jan 7.   Slo-mag---does it help your blood like increasing your platelets?  Please advise. Could she refill that Rx for that?

## 2015-12-11 ENCOUNTER — Telehealth: Payer: Self-pay | Admitting: Family Medicine

## 2015-12-11 MED ORDER — MAGNESIUM CHLORIDE 64 MG PO TBEC: 1.0000 | DELAYED_RELEASE_TABLET | Freq: Every day | ORAL | Status: DC

## 2015-12-11 NOTE — Telephone Encounter (Signed)
Pt called and said that she has Magnesium-Zinc and doesn't need this. She would however like to have a prescription of Slow-Mag since she feels this works so much better. jw

## 2015-12-11 NOTE — Telephone Encounter (Signed)
Done

## 2015-12-29 ENCOUNTER — Other Ambulatory Visit: Payer: Self-pay | Admitting: *Deleted

## 2015-12-29 ENCOUNTER — Other Ambulatory Visit (HOSPITAL_BASED_OUTPATIENT_CLINIC_OR_DEPARTMENT_OTHER): Payer: Medicaid Other

## 2015-12-29 ENCOUNTER — Telehealth: Payer: Self-pay | Admitting: Oncology

## 2015-12-29 ENCOUNTER — Ambulatory Visit (HOSPITAL_BASED_OUTPATIENT_CLINIC_OR_DEPARTMENT_OTHER): Payer: Medicaid Other | Admitting: Oncology

## 2015-12-29 VITALS — BP 125/76 | HR 68 | Temp 97.6°F | Resp 18 | Ht 65.0 in | Wt 236.6 lb

## 2015-12-29 DIAGNOSIS — C50911 Malignant neoplasm of unspecified site of right female breast: Secondary | ICD-10-CM

## 2015-12-29 DIAGNOSIS — Z170 Estrogen receptor positive status [ER+]: Secondary | ICD-10-CM

## 2015-12-29 DIAGNOSIS — Z853 Personal history of malignant neoplasm of breast: Secondary | ICD-10-CM

## 2015-12-29 DIAGNOSIS — C50912 Malignant neoplasm of unspecified site of left female breast: Secondary | ICD-10-CM

## 2015-12-29 DIAGNOSIS — G8929 Other chronic pain: Secondary | ICD-10-CM

## 2015-12-29 DIAGNOSIS — I10 Essential (primary) hypertension: Secondary | ICD-10-CM

## 2015-12-29 DIAGNOSIS — C50412 Malignant neoplasm of upper-outer quadrant of left female breast: Secondary | ICD-10-CM

## 2015-12-29 DIAGNOSIS — C773 Secondary and unspecified malignant neoplasm of axilla and upper limb lymph nodes: Secondary | ICD-10-CM

## 2015-12-29 DIAGNOSIS — Z720 Tobacco use: Secondary | ICD-10-CM | POA: Diagnosis not present

## 2015-12-29 DIAGNOSIS — D75839 Thrombocytosis, unspecified: Secondary | ICD-10-CM

## 2015-12-29 DIAGNOSIS — D473 Essential (hemorrhagic) thrombocythemia: Secondary | ICD-10-CM | POA: Diagnosis not present

## 2015-12-29 DIAGNOSIS — M792 Neuralgia and neuritis, unspecified: Secondary | ICD-10-CM

## 2015-12-29 DIAGNOSIS — G894 Chronic pain syndrome: Secondary | ICD-10-CM | POA: Diagnosis not present

## 2015-12-29 DIAGNOSIS — I700 Atherosclerosis of aorta: Secondary | ICD-10-CM

## 2015-12-29 DIAGNOSIS — I890 Lymphedema, not elsewhere classified: Secondary | ICD-10-CM | POA: Diagnosis not present

## 2015-12-29 DIAGNOSIS — F1920 Other psychoactive substance dependence, uncomplicated: Secondary | ICD-10-CM

## 2015-12-29 DIAGNOSIS — C50811 Malignant neoplasm of overlapping sites of right female breast: Secondary | ICD-10-CM

## 2015-12-29 LAB — COMPREHENSIVE METABOLIC PANEL
ALBUMIN: 3.5 g/dL (ref 3.5–5.0)
ALT: 33 U/L (ref 0–55)
ANION GAP: 10 meq/L (ref 3–11)
AST: 25 U/L (ref 5–34)
Alkaline Phosphatase: 77 U/L (ref 40–150)
BILIRUBIN TOTAL: 0.43 mg/dL (ref 0.20–1.20)
BUN: 10.1 mg/dL (ref 7.0–26.0)
CO2: 23 meq/L (ref 22–29)
CREATININE: 1 mg/dL (ref 0.6–1.1)
Calcium: 9.2 mg/dL (ref 8.4–10.4)
Chloride: 106 mEq/L (ref 98–109)
EGFR: 74 mL/min/{1.73_m2} — AB (ref >90)
Glucose: 95 mg/dl (ref 70–140)
Potassium: 3.4 mEq/L — ABNORMAL LOW (ref 3.5–5.1)
Sodium: 139 mEq/L (ref 136–145)
TOTAL PROTEIN: 6.9 g/dL (ref 6.4–8.3)

## 2015-12-29 LAB — CBC WITH DIFFERENTIAL/PLATELET
BASO%: 0.5 % (ref 0.0–2.0)
Basophils Absolute: 0 10*3/uL (ref 0.0–0.1)
EOS ABS: 0.2 10*3/uL (ref 0.0–0.5)
EOS%: 3 % (ref 0.0–7.0)
HCT: 42 % (ref 34.8–46.6)
HEMOGLOBIN: 14.2 g/dL (ref 11.6–15.9)
LYMPH#: 2 10*3/uL (ref 0.9–3.3)
LYMPH%: 36 % (ref 14.0–49.7)
MCH: 28.9 pg (ref 25.1–34.0)
MCHC: 33.8 g/dL (ref 31.5–36.0)
MCV: 85.4 fL (ref 79.5–101.0)
MONO#: 0.4 10*3/uL (ref 0.1–0.9)
MONO%: 7.1 % (ref 0.0–14.0)
NEUT%: 53.4 % (ref 38.4–76.8)
NEUTROS ABS: 3 10*3/uL (ref 1.5–6.5)
PLATELETS: 460 10*3/uL — AB (ref 145–400)
RBC: 4.92 10*6/uL (ref 3.70–5.45)
RDW: 16.1 % — AB (ref 11.2–14.5)
WBC: 5.6 10*3/uL (ref 3.9–10.3)

## 2015-12-29 MED ORDER — METHADONE HCL 5 MG PO TABS: ORAL_TABLET | ORAL | Status: DC
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
## 2015-12-29 NOTE — Telephone Encounter (Signed)
Appointments made and avs pritned for patient °

## 2016-01-07 ENCOUNTER — Ambulatory Visit: Payer: Medicaid Other | Admitting: Physical Therapy

## 2016-01-14 ENCOUNTER — Ambulatory Visit: Payer: Medicaid Other | Admitting: Physical Therapy

## 2016-01-20 ENCOUNTER — Ambulatory Visit (INDEPENDENT_AMBULATORY_CARE_PROVIDER_SITE_OTHER): Payer: Medicaid Other | Admitting: Family Medicine

## 2016-01-20 ENCOUNTER — Encounter: Payer: Self-pay | Admitting: Family Medicine

## 2016-01-20 VITALS — BP 111/78 | HR 78 | Temp 97.6°F | Wt 228.2 lb

## 2016-01-20 DIAGNOSIS — M79605 Pain in left leg: Secondary | ICD-10-CM | POA: Diagnosis present
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
## 2016-01-20 NOTE — Patient Instructions (Signed)
We will get an ultrasound to make sure that you don't have a blood clot in your legs. I think you probably have a small contusion. This takes a couple weeks to heal.  If your ultrasound is negative and your leg is not improved in 1-2 weeks, please let us know.  Please schedule an appointment to see Dr Gerlean Ren soon.  Take care,  Dr Jerline Pain

## 2016-01-22 ENCOUNTER — Ambulatory Visit (HOSPITAL_COMMUNITY): Admission: RE | Admit: 2016-01-22 | Payer: Medicaid Other | Source: Ambulatory Visit

## 2016-01-23 ENCOUNTER — Telehealth: Payer: Self-pay | Admitting: *Deleted

## 2016-01-23 DIAGNOSIS — I10 Essential (primary) hypertension: Secondary | ICD-10-CM

## 2016-01-23 DIAGNOSIS — Z853 Personal history of malignant neoplasm of breast: Secondary | ICD-10-CM

## 2016-01-23 DIAGNOSIS — G8929 Other chronic pain: Secondary | ICD-10-CM

## 2016-01-23 DIAGNOSIS — M792 Neuralgia and neuritis, unspecified: Secondary | ICD-10-CM

## 2016-01-23 NOTE — Telephone Encounter (Signed)
"  I need an appointment this month with Dr.Magrinat or Heather.  There is a spot om my chest were watching since my last visit I need to make sure it's not changed.  Area to left side is changing and more noticeable to me.  May be larger and did hurt a little yesterday."   "I also need a refill on Methadone.  Will pick this upFebruary 9, 2017.  Please have this ready on February 9."

## 2016-01-26 ENCOUNTER — Ambulatory Visit: Payer: Medicaid Other | Admitting: Physical Therapy

## 2016-01-27 ENCOUNTER — Ambulatory Visit: Payer: Medicaid Other | Admitting: Oncology

## 2016-01-27 ENCOUNTER — Other Ambulatory Visit: Payer: Self-pay

## 2016-01-27 ENCOUNTER — Telehealth: Payer: Self-pay | Admitting: Nurse Practitioner

## 2016-01-27 ENCOUNTER — Ambulatory Visit (HOSPITAL_COMMUNITY)
Admission: RE | Admit: 2016-01-27 | Discharge: 2016-01-27 | Disposition: A | Discharge status: Home / Self Care | Payer: Medicaid Other | Source: Ambulatory Visit | Attending: Cardiology | Admitting: Cardiology

## 2016-01-27 ENCOUNTER — Other Ambulatory Visit: Payer: Medicaid Other

## 2016-01-27 DIAGNOSIS — E785 Hyperlipidemia, unspecified: Secondary | ICD-10-CM | POA: Diagnosis not present

## 2016-01-27 DIAGNOSIS — I10 Essential (primary) hypertension: Secondary | ICD-10-CM | POA: Diagnosis not present

## 2016-01-27 DIAGNOSIS — M79605 Pain in left leg: Secondary | ICD-10-CM | POA: Insufficient documentation

## 2016-01-27 DIAGNOSIS — M7989 Other specified soft tissue disorders: Secondary | ICD-10-CM | POA: Insufficient documentation

## 2016-01-27 MED ORDER — METHADONE HCL 5 MG PO TABS: ORAL_TABLET | ORAL | Status: DC

## 2016-01-27 NOTE — Telephone Encounter (Signed)
Methadone prescription taken over to injection nurse.  Writer also spoke with Nira Conn, NP who will see patient when she stops in tomorrow to pick up her prescription.

## 2016-01-27 NOTE — Telephone Encounter (Signed)
per pof to sch pt appt-cld & spoke to pt and gave pt time & dateof appt 2/8

## 2016-01-27 NOTE — Telephone Encounter (Signed)
"  Calling in reference to script  For methadone.  I'll pick this up tomorrow.  I also still concerned about this area on my chest."

## 2016-01-28 ENCOUNTER — Telehealth: Payer: Self-pay | Admitting: *Deleted

## 2016-01-28 ENCOUNTER — Other Ambulatory Visit: Payer: Medicaid Other

## 2016-01-28 ENCOUNTER — Encounter: Payer: Self-pay | Admitting: Family Medicine

## 2016-01-28 ENCOUNTER — Ambulatory Visit: Payer: Medicaid Other | Admitting: Physical Therapy

## 2016-01-28 ENCOUNTER — Ambulatory Visit: Payer: Medicaid Other | Admitting: Nurse Practitioner
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
## 2016-01-29 ENCOUNTER — Other Ambulatory Visit: Payer: Self-pay | Admitting: *Deleted

## 2016-01-29 ENCOUNTER — Telehealth: Payer: Self-pay | Admitting: Oncology

## 2016-01-29 ENCOUNTER — Telehealth: Payer: Self-pay | Admitting: *Deleted

## 2016-01-29 DIAGNOSIS — C50811 Malignant neoplasm of overlapping sites of right female breast: Secondary | ICD-10-CM

## 2016-01-29 NOTE — Telephone Encounter (Signed)
Left message for patient re 2/10 lab/fu.

## 2016-01-29 NOTE — Telephone Encounter (Signed)
Called pt back. Pt came to pick up her pain medication script today, but was not able to stay for examination by NP. She had another appt that put her on a time constraint. Pt was a no show yesterday for this appt. I will put pt back on schedule for tomorrow 2/10 to be seen by [name]. Pt agrees with this plan. No further concerns.Message to be fwd to [employer]

## 2016-01-30 ENCOUNTER — Other Ambulatory Visit: Payer: Medicaid Other

## 2016-01-30 ENCOUNTER — Ambulatory Visit: Payer: Medicaid Other | Admitting: Nurse Practitioner

## 2016-02-02 ENCOUNTER — Other Ambulatory Visit: Payer: Self-pay | Admitting: *Deleted

## 2016-02-02 ENCOUNTER — Telehealth: Payer: Self-pay | Admitting: Nurse Practitioner

## 2016-02-02 DIAGNOSIS — C50412 Malignant neoplasm of upper-outer quadrant of left female breast: Secondary | ICD-10-CM

## 2016-02-02 NOTE — Telephone Encounter (Signed)
S/w pt confirming labs/ov per 02/13 POF with NP/CB.... [name]

## 2016-02-03 ENCOUNTER — Encounter: Payer: Self-pay | Admitting: Nurse Practitioner

## 2016-02-03 ENCOUNTER — Telehealth: Payer: Self-pay | Admitting: *Deleted

## 2016-02-03 ENCOUNTER — Other Ambulatory Visit (HOSPITAL_BASED_OUTPATIENT_CLINIC_OR_DEPARTMENT_OTHER): Payer: Medicaid Other

## 2016-02-03 ENCOUNTER — Ambulatory Visit (HOSPITAL_BASED_OUTPATIENT_CLINIC_OR_DEPARTMENT_OTHER): Payer: Medicaid Other | Admitting: Nurse Practitioner

## 2016-02-03 VITALS — BP 124/72 | HR 70 | Temp 97.6°F | Resp 18 | Ht 65.0 in | Wt 229.8 lb

## 2016-02-03 DIAGNOSIS — C50412 Malignant neoplasm of upper-outer quadrant of left female breast: Secondary | ICD-10-CM

## 2016-02-03 DIAGNOSIS — R222 Localized swelling, mass and lump, trunk: Secondary | ICD-10-CM | POA: Diagnosis not present

## 2016-02-03 DIAGNOSIS — E876 Hypokalemia: Secondary | ICD-10-CM | POA: Diagnosis not present

## 2016-02-03 DIAGNOSIS — L989 Disorder of the skin and subcutaneous tissue, unspecified: Secondary | ICD-10-CM

## 2016-02-03 LAB — COMPREHENSIVE METABOLIC PANEL
ALT: 21 U/L (ref 0–55)
AST: 24 U/L (ref 5–34)
Albumin: 3.8 g/dL (ref 3.5–5.0)
Alkaline Phosphatase: 98 U/L (ref 40–150)
Anion Gap: 13 mEq/L — ABNORMAL HIGH (ref 3–11)
BUN: 14 mg/dL (ref 7.0–26.0)
CHLORIDE: 105 meq/L (ref 98–109)
CO2: 22 mEq/L (ref 22–29)
Calcium: 9.5 mg/dL (ref 8.4–10.4)
Creatinine: 1.2 mg/dL — ABNORMAL HIGH (ref 0.6–1.1)
EGFR: 61 mL/min/{1.73_m2} — ABNORMAL LOW (ref >90)
GLUCOSE: 95 mg/dL (ref 70–140)
POTASSIUM: 3.1 meq/L — AB (ref 3.5–5.1)
SODIUM: 141 meq/L (ref 136–145)
Total Bilirubin: 0.34 mg/dL (ref 0.20–1.20)
Total Protein: 7.4 g/dL (ref 6.4–8.3)

## 2016-02-03 LAB — CBC WITH DIFFERENTIAL/PLATELET
BASO%: 0.2 % (ref 0.0–2.0)
BASOS ABS: 0 10*3/uL (ref 0.0–0.1)
EOS%: 1.6 % (ref 0.0–7.0)
Eosinophils Absolute: 0.2 10*3/uL (ref 0.0–0.5)
HCT: 42.5 % (ref 34.8–46.6)
HGB: 14.5 g/dL (ref 11.6–15.9)
LYMPH#: 2.3 10*3/uL (ref 0.9–3.3)
LYMPH%: 23.6 % (ref 14.0–49.7)
MCH: 29.3 pg (ref 25.1–34.0)
MCHC: 34.1 g/dL (ref 31.5–36.0)
MCV: 85.9 fL (ref 79.5–101.0)
MONO#: 0.7 10*3/uL (ref 0.1–0.9)
MONO%: 7.1 % (ref 0.0–14.0)
NEUT#: 6.5 10*3/uL (ref 1.5–6.5)
NEUT%: 67.5 % (ref 38.4–76.8)
Platelets: 569 10*3/uL — ABNORMAL HIGH (ref 145–400)
RBC: 4.95 10*6/uL (ref 3.70–5.45)
RDW: 16.2 % — AB (ref 11.2–14.5)
WBC: 9.6 10*3/uL (ref 3.9–10.3)

## 2016-02-03 NOTE — Assessment & Plan Note (Signed)
Patient is status post bilateral mastectomy; as well as both chemotherapy and radiation treatments.  She is currently undergoing tamoxifen oral therapy only.  Patient has had a chronic left chest wall lesion/mass that has been concerning to her for quite some time.  She feels that this lesion has now grown in size; and is occasionally tender for her.  Also, patient has a questionable nodule to her left lower leg.  Directly below her left knee.  Exam today reveals bilateral mastectomy scars well healed.  Patient does have an area at her left mastectomy scar that appears to be scar tissue approximately 2-3 cm in diameter.  There is no erythema, warmth, tenderness, or edema to the site.  There is also no red streaks to the site.  Patient also has what appears to be a questionable varicose vein to the left inner lower leg.  There is no erythema, edema, warmth, or tenderness to that site as well.  Confirmed that patient just underwent a Doppler ultrasound of the left lower extremity to rule out DVT last week on 01/27/2016.  Doppler ultrasound of that time was negative.  Patient is scheduled to follow-up at the lymphedema clinic on 02/04/2016.  She was noted to be wearing a lymphedema sleeve to the left upper extremity today.  We'll arrange for patient to undergo a diagnostic left chest wall/axilla ultrasound to further investigate this lesion since patient feels that the lesion has increased in size.  Also, per Dr. [name] recent notes-patient is scheduled to return in August 2017 for her follow-up.  Patient also knows to call in the interim with any new worries or concerns.
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
## 2016-02-03 NOTE — Assessment & Plan Note (Signed)
Potassium was down to 3.1 today.  Confirmed the patient is taking potassium 20 mEq twice daily.  Patient states that she was previously taking 60 mEq of potassium a day.  Advised patient to increase her potassium to 60 mEq per day for one week; then to decrease down to 40 mEq of potassium per day.  Will need to continue monitoring closely; since patient has a history of chronic hypokalemia.

## 2016-02-03 NOTE — Telephone Encounter (Signed)
TC to pt regarding low potassium. Pt recently changed from 2 tablets in the morning and one tablet at night to 1 tablet BID. Pt will go back to taking 2 tablets in the morning and 1 tablet at night for a week.

## 2016-02-03 NOTE — Telephone Encounter (Signed)
Pt called requesting to be seen. Pt has missed 2 prior appt with [name] last week. Pt has an area she is concerned about to her chest. Dr. [name] has been following this area and everything seemed to be fine the last time he saw her.Pt feels like she's seen a change and it seems to be more noticeable. I am setting this pt an appt to see [name],NP on 02/03/16. Pt is agreeable to this plan. I stressed the importance that she needs to come to appt.

## 2016-02-04 ENCOUNTER — Ambulatory Visit: Payer: Medicaid Other | Admitting: Physical Therapy

## 2016-02-04 ENCOUNTER — Other Ambulatory Visit: Payer: Self-pay | Admitting: Nurse Practitioner

## 2016-02-04 ENCOUNTER — Telehealth: Payer: Self-pay | Admitting: Oncology

## 2016-02-04 DIAGNOSIS — C50412 Malignant neoplasm of upper-outer quadrant of left female breast: Secondary | ICD-10-CM

## 2016-02-04 NOTE — Telephone Encounter (Signed)
Attempted to schedule US Breast per 2/14 pof with Breast center but due to screening questions Laticha at the breast center will contact patient to schedule appointment.

## 2016-02-09 ENCOUNTER — Other Ambulatory Visit: Payer: Self-pay | Admitting: Nurse Practitioner

## 2016-02-09 ENCOUNTER — Telehealth: Payer: Self-pay | Admitting: *Deleted

## 2016-02-09 NOTE — Telephone Encounter (Signed)
Pt called concerned about upcoming ultrasound that she will have done on Feb.22 to left breast area. Pt was concerned whether the right breast should be looked at as well. I explained to pt we are looking at the area of concern and right now the right breast is not the area of concern , if it should become we will look into this. Pt verbalized understanding. No further concerns.

## 2016-02-10 ENCOUNTER — Ambulatory Visit: Payer: Medicaid Other | Attending: Oncology | Admitting: Physical Therapy

## 2016-02-10 DIAGNOSIS — I972 Postmastectomy lymphedema syndrome: Secondary | ICD-10-CM | POA: Diagnosis present

## 2016-02-10 DIAGNOSIS — I890 Lymphedema, not elsewhere classified: Secondary | ICD-10-CM | POA: Diagnosis present
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
## 2016-02-11 ENCOUNTER — Other Ambulatory Visit: Payer: Medicaid Other

## 2016-02-12 ENCOUNTER — Other Ambulatory Visit: Payer: Self-pay | Admitting: Nurse Practitioner

## 2016-02-12 DIAGNOSIS — C50412 Malignant neoplasm of upper-outer quadrant of left female breast: Secondary | ICD-10-CM

## 2016-02-17 ENCOUNTER — Telehealth: Payer: Self-pay | Admitting: *Deleted

## 2016-02-17 ENCOUNTER — Other Ambulatory Visit: Payer: Self-pay | Admitting: *Deleted

## 2016-02-17 NOTE — Telephone Encounter (Signed)
This RN was contacted by Vaughan Basta in managed care stating CT of chest denied by insurance per " need for biopsy of nodule "  Noted U/S was also denied ( reason not documented ) -  Above will be given to MD for review and appropriate recommendations for area of concern.  Presently scans have not been scheduled.

## 2016-02-20 ENCOUNTER — Ambulatory Visit: Payer: Medicaid Other | Admitting: Family Medicine

## 2016-02-20 ENCOUNTER — Ambulatory Visit (HOSPITAL_COMMUNITY): Payer: Medicaid Other

## 2016-02-22 ENCOUNTER — Encounter (HOSPITAL_COMMUNITY): Payer: Self-pay | Admitting: Nurse Practitioner

## 2016-02-22 ENCOUNTER — Emergency Department (HOSPITAL_COMMUNITY): Payer: Medicaid Other

## 2016-02-22 ENCOUNTER — Emergency Department (HOSPITAL_COMMUNITY)
Admission: EM | Admit: 2016-02-22 | Discharge: 2016-02-22 | Disposition: A | Discharge status: Home / Self Care | Payer: Medicaid Other | Attending: Emergency Medicine | Admitting: Emergency Medicine

## 2016-02-22 DIAGNOSIS — Z862 Personal history of diseases of the blood and blood-forming organs and certain disorders involving the immune mechanism: Secondary | ICD-10-CM | POA: Insufficient documentation

## 2016-02-22 DIAGNOSIS — J069 Acute upper respiratory infection, unspecified: Secondary | ICD-10-CM | POA: Insufficient documentation

## 2016-02-22 DIAGNOSIS — I10 Essential (primary) hypertension: Secondary | ICD-10-CM | POA: Insufficient documentation

## 2016-02-22 DIAGNOSIS — Z853 Personal history of malignant neoplasm of breast: Secondary | ICD-10-CM | POA: Insufficient documentation

## 2016-02-22 DIAGNOSIS — E785 Hyperlipidemia, unspecified: Secondary | ICD-10-CM | POA: Diagnosis not present

## 2016-02-22 DIAGNOSIS — R0602 Shortness of breath: Secondary | ICD-10-CM | POA: Diagnosis present

## 2016-02-22 DIAGNOSIS — F17210 Nicotine dependence, cigarettes, uncomplicated: Secondary | ICD-10-CM | POA: Insufficient documentation

## 2016-02-22 DIAGNOSIS — M170 Bilateral primary osteoarthritis of knee: Secondary | ICD-10-CM | POA: Insufficient documentation

## 2016-02-22 DIAGNOSIS — Z9889 Other specified postprocedural states: Secondary | ICD-10-CM | POA: Diagnosis not present

## 2016-02-22 DIAGNOSIS — Z87448 Personal history of other diseases of urinary system: Secondary | ICD-10-CM | POA: Insufficient documentation

## 2016-02-22 DIAGNOSIS — I2510 Atherosclerotic heart disease of native coronary artery without angina pectoris: Secondary | ICD-10-CM | POA: Insufficient documentation

## 2016-02-22 DIAGNOSIS — Z79899 Other long term (current) drug therapy: Secondary | ICD-10-CM | POA: Diagnosis not present

## 2016-02-22 DIAGNOSIS — Z7982 Long term (current) use of aspirin: Secondary | ICD-10-CM | POA: Insufficient documentation

## 2016-02-22 DIAGNOSIS — G8929 Other chronic pain: Secondary | ICD-10-CM | POA: Insufficient documentation

## 2016-02-22 LAB — CBC WITH DIFFERENTIAL/PLATELET
BASOS ABS: 0 10*3/uL (ref 0.0–0.1)
Basophils Relative: 0 %
EOS PCT: 3 %
Eosinophils Absolute: 0.2 10*3/uL (ref 0.0–0.7)
HCT: 42.4 % (ref 36.0–46.0)
Hemoglobin: 14.4 g/dL (ref 12.0–15.0)
Lymphocytes Relative: 28 %
Lymphs Abs: 2.5 10*3/uL (ref 0.7–4.0)
MCH: 29.5 pg (ref 26.0–34.0)
MCHC: 34 g/dL (ref 30.0–36.0)
MCV: 86.9 fL (ref 78.0–100.0)
MONO ABS: 0.6 10*3/uL (ref 0.1–1.0)
MONOS PCT: 6 %
Neutro Abs: 5.7 10*3/uL (ref 1.7–7.7)
Neutrophils Relative %: 63 %
PLATELETS: 545 10*3/uL — AB (ref 150–400)
RBC: 4.88 MIL/uL (ref 3.87–5.11)
RDW: 15.5 % (ref 11.5–15.5)
WBC: 9.1 10*3/uL (ref 4.0–10.5)

## 2016-02-22 LAB — BASIC METABOLIC PANEL
Anion gap: 10 (ref 5–15)
BUN: 18 mg/dL (ref 6–20)
CHLORIDE: 107 mmol/L (ref 101–111)
CO2: 23 mmol/L (ref 22–32)
Calcium: 8.9 mg/dL (ref 8.9–10.3)
Creatinine, Ser: 1.17 mg/dL — ABNORMAL HIGH (ref 0.44–1.00)
GFR calc Af Amer: >60 mL/min (ref >60)
GFR calc non Af Amer: 53 mL/min — ABNORMAL LOW (ref >60)
GLUCOSE: 123 mg/dL — AB (ref 65–99)
POTASSIUM: 3.4 mmol/L — AB (ref 3.5–5.1)
Sodium: 140 mmol/L (ref 135–145)

## 2016-02-22 LAB — I-STAT TROPONIN, ED: TROPONIN I, POC: 0 ng/mL (ref 0.00–0.08)

## 2016-02-22 MED ORDER — BENZONATATE 100 MG PO CAPS
100.0000 mg | ORAL_CAPSULE | Freq: Once | ORAL | Status: AC
Start: 2016-02-22 — End: 2016-02-22
  Administered 2016-02-22: 100 mg via ORAL
  Filled 2016-02-22: qty 1

## 2016-02-22 MED ORDER — ACETAMINOPHEN 500 MG PO TABS
1000.0000 mg | ORAL_TABLET | Freq: Once | ORAL | Status: AC
  Administered 2016-02-22: 1000 mg via ORAL
  Filled 2016-02-22: qty 2

## 2016-02-22 MED ORDER — BENZONATATE 100 MG PO CAPS: 100.0000 mg | ORAL_CAPSULE | Freq: Three times a day (TID) | ORAL | Status: DC

## 2016-02-22 NOTE — ED Notes (Signed)
Pt states she chest pain radiating to her upper back and shortness of breath especially when she cough, worried that she may have pneumonia or the flu. Symptoms onset 2 days ago.

## 2016-02-22 NOTE — Discharge Instructions (Signed)
Take tylenol 2 pills 4 times a day and motrin 4 pills 3 times a day.  Drink plenty of fluids.  Return for worsening shortness of breath, headache, confusion. Follow up with your family doctor.  ° °Upper Respiratory Infection, Adult °Most upper respiratory infections (URIs) are a viral infection of the air passages leading to the lungs. A URI affects the nose, throat, and upper air passages. The most common type of URI is nasopharyngitis and is typically referred to as "the common cold." °URIs run their course and usually go away on their own. Most of the time, a URI does not require medical attention, but sometimes a bacterial infection in the upper airways can follow a viral infection. This is called a secondary infection. Sinus and middle ear infections are common types of secondary upper respiratory infections. °Bacterial pneumonia can also complicate a URI. A URI can worsen asthma and chronic obstructive pulmonary disease (COPD). Sometimes, these complications can require emergency medical care and may be life threatening.  °CAUSES °Almost all URIs are caused by viruses. A virus is a type of germ and can spread from one person to another.  °RISKS FACTORS °You may be at risk for a URI if:  °· You smoke.   °· You have chronic heart or lung disease. °· You have a weakened defense (immune) system.   °· You are very young or very old.   °· You have nasal allergies or asthma. °· You work in crowded or poorly ventilated areas. °· You work in health care facilities or schools. °SIGNS AND SYMPTOMS  °Symptoms typically develop 2-3 days after you come in contact with a cold virus. Most viral URIs last 7-10 days. However, viral URIs from the influenza virus (flu virus) can last 14-18 days and are typically more severe. Symptoms may include:  °· Runny or stuffy (congested) nose.   °· Sneezing.   °· Cough.   °· Sore throat.   °· Headache.   °· Fatigue.   °· Fever.   °· Loss of appetite.   °· Pain in your forehead, behind your  eyes, and over your cheekbones (sinus pain). °· Muscle aches.   °DIAGNOSIS  °Your health care provider may diagnose a URI by: °· Physical exam. °· Tests to check that your symptoms are not due to another condition such as: °¨ Strep throat. °¨ Sinusitis. °¨ Pneumonia. °¨ Asthma. °TREATMENT  °A URI goes away on its own with time. It cannot be cured with medicines, but medicines may be prescribed or recommended to relieve symptoms. Medicines may help: °· Reduce your fever. °· Reduce your cough. °· Relieve nasal congestion. °HOME CARE INSTRUCTIONS  °· Take medicines only as directed by your health care provider.   °· Gargle warm saltwater or take cough drops to comfort your throat as directed by your health care provider. °· Use a warm mist humidifier or inhale steam from a shower to increase air moisture. This may make it easier to breathe. °· Drink enough fluid to keep your urine clear or pale yellow.   °· Eat soups and other clear broths and maintain good nutrition.   °· Rest as needed.   °· Return to work when your temperature has returned to normal or as your health care provider advises. You may need to stay home longer to avoid infecting others. You can also use a face mask and careful hand washing to prevent spread of the virus. °· Increase the usage of your inhaler if you have asthma.   °· Do not use any tobacco products, including cigarettes, chewing tobacco, or electronic cigarettes.   If you need help quitting, ask your health care provider. °PREVENTION  °The best way to protect yourself from getting a cold is to practice good hygiene.  °· Avoid oral or hand contact with people with cold symptoms.   °· Wash your hands often if contact occurs.   °There is no clear evidence that vitamin C, vitamin E, echinacea, or exercise reduces the chance of developing a cold. However, it is always recommended to get plenty of rest, exercise, and practice good nutrition.  °SEEK MEDICAL CARE IF:  °· You are getting worse  rather than better.   °· Your symptoms are not controlled by medicine.   °· You have chills. °· You have worsening shortness of breath. °· You have brown or red mucus. °· You have yellow or brown nasal discharge. °· You have pain in your face, especially when you bend forward. °· You have a fever. °· You have swollen neck glands. °· You have pain while swallowing. °· You have white areas in the back of your throat. °SEEK IMMEDIATE MEDICAL CARE IF:  °· You have severe or persistent: °¨ Headache. °¨ Ear pain. °¨ Sinus pain. °¨ Chest pain. °· You have chronic lung disease and any of the following: °¨ Wheezing. °¨ Prolonged cough. °¨ Coughing up blood. °¨ A change in your usual mucus. °· You have a stiff neck. °· You have changes in your: °¨ Vision. °¨ Hearing. °¨ Thinking. °¨ Mood. °MAKE SURE YOU:  °· Understand these instructions. °· Will watch your condition. °· Will get help right away if you are not doing well or get worse. °  °This information is not intended to replace advice given to you by your health care provider. Make sure you discuss any questions you have with your health care provider. °  °Document Released: 06/01/2001 Document Revised: 04/22/2015 Document Reviewed: 03/13/2014 °Elsevier Interactive Patient Education ©2016 [employer] ° °
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
## 2016-02-23 ENCOUNTER — Other Ambulatory Visit: Payer: Self-pay | Admitting: *Deleted

## 2016-02-23 ENCOUNTER — Telehealth: Payer: Self-pay | Admitting: *Deleted

## 2016-02-23 NOTE — Telephone Encounter (Signed)
Called pt to inform her that a follow up appt has been made for her to see Dr. Excell Seltzer on March 16 @ 8:45a to evaluate the left chest wall lesion which pt feels has increased in size. Pt understands and agrees with appt. Also I will refill Rx for methadone for Wed. Pt will come to p/u.Message to be fwd to [employer].

## 2016-02-24 ENCOUNTER — Other Ambulatory Visit: Payer: Self-pay | Admitting: *Deleted

## 2016-02-24 DIAGNOSIS — Z853 Personal history of malignant neoplasm of breast: Secondary | ICD-10-CM

## 2016-02-24 DIAGNOSIS — M792 Neuralgia and neuritis, unspecified: Secondary | ICD-10-CM

## 2016-02-24 DIAGNOSIS — I10 Essential (primary) hypertension: Secondary | ICD-10-CM

## 2016-02-24 DIAGNOSIS — G8929 Other chronic pain: Secondary | ICD-10-CM

## 2016-02-24 MED ORDER — METHADONE HCL 5 MG PO TABS: ORAL_TABLET | ORAL | Status: DC

## 2016-02-26 ENCOUNTER — Ambulatory Visit: Payer: Medicaid Other | Admitting: Physical Therapy

## 2016-03-02 ENCOUNTER — Encounter: Payer: Self-pay | Admitting: Physical Therapy

## 2016-03-02 ENCOUNTER — Ambulatory Visit: Payer: Medicaid Other | Attending: Oncology | Admitting: Physical Therapy

## 2016-03-02 DIAGNOSIS — I972 Postmastectomy lymphedema syndrome: Secondary | ICD-10-CM

## 2016-03-02 DIAGNOSIS — I890 Lymphedema, not elsewhere classified: Secondary | ICD-10-CM
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
## 2016-03-05 ENCOUNTER — Telehealth: Payer: Self-pay | Admitting: *Deleted

## 2016-03-05 NOTE — Telephone Encounter (Signed)
Called pt to f/u about her appt with Dr. Excell Seltzer on 03/04/16. Pt informed this nurse that her appt was moved to 4/11/17at 2:30p. I will f/u after this appt. Message to be fwd to [employer].

## 2016-03-11 ENCOUNTER — Ambulatory Visit: Payer: Medicaid Other | Admitting: Family Medicine

## 2016-03-19 ENCOUNTER — Ambulatory Visit: Payer: Medicaid Other | Admitting: Family Medicine

## 2016-03-21 ENCOUNTER — Other Ambulatory Visit: Payer: Self-pay | Admitting: Family Medicine

## 2016-03-23 ENCOUNTER — Encounter: Payer: Self-pay | Admitting: Oncology

## 2016-03-23 ENCOUNTER — Ambulatory Visit: Payer: Medicaid Other | Admitting: Physical Therapy

## 2016-03-23 NOTE — Progress Notes (Signed)
Fax done 10/07/16 I sent to medical records

## 2016-03-24 ENCOUNTER — Other Ambulatory Visit: Payer: Self-pay | Admitting: *Deleted

## 2016-03-24 DIAGNOSIS — Z853 Personal history of malignant neoplasm of breast: Secondary | ICD-10-CM

## 2016-03-24 DIAGNOSIS — I10 Essential (primary) hypertension: Secondary | ICD-10-CM

## 2016-03-24 DIAGNOSIS — G8929 Other chronic pain: Secondary | ICD-10-CM

## 2016-03-24 DIAGNOSIS — M792 Neuralgia and neuritis, unspecified: Secondary | ICD-10-CM

## 2016-03-24 MED ORDER — METHADONE HCL 5 MG PO TABS: ORAL_TABLET | ORAL | Status: DC

## 2016-03-29 ENCOUNTER — Telehealth: Payer: Self-pay | Admitting: *Deleted

## 2016-03-29 NOTE — Telephone Encounter (Signed)
Called pt to remind her of appt with Dr. Excell Seltzer for 03/30/16 @ 3:15p. Pt knows to arrive at 2:30p. No further concerns. Message to be fwd to H. Boelter,NP.

## 2016-03-31 ENCOUNTER — Ambulatory Visit: Payer: Medicaid Other | Admitting: Family Medicine

## 2016-04-06 ENCOUNTER — Encounter: Payer: Self-pay | Admitting: Family Medicine

## 2016-04-06 ENCOUNTER — Ambulatory Visit (INDEPENDENT_AMBULATORY_CARE_PROVIDER_SITE_OTHER): Payer: Medicaid Other | Admitting: Family Medicine

## 2016-04-06 ENCOUNTER — Other Ambulatory Visit: Payer: Self-pay | Admitting: Family Medicine

## 2016-04-06 VITALS — BP 121/75 | HR 75 | Temp 98.7°F | Wt 230.0 lb

## 2016-04-06 DIAGNOSIS — M25561 Pain in right knee: Secondary | ICD-10-CM

## 2016-04-06 DIAGNOSIS — N289 Disorder of kidney and ureter, unspecified: Secondary | ICD-10-CM | POA: Diagnosis present

## 2016-04-06 LAB — POCT URINALYSIS DIPSTICK
BILIRUBIN UA: NEGATIVE
Glucose, UA: NEGATIVE
Leukocytes, UA: NEGATIVE
Nitrite, UA: NEGATIVE
PH UA: 6
PROTEIN UA: NEGATIVE
RBC UA: NEGATIVE
SPEC GRAV UA: 1.02
Urobilinogen, UA: 0.2

## 2016-04-06 MED ORDER — HYDROCODONE-ACETAMINOPHEN 10-300 MG PO TABS: 1.0000 | ORAL_TABLET | Freq: Four times a day (QID) | ORAL | Status: DC | PRN

## 2016-04-06 NOTE — Patient Instructions (Addendum)
Thank you so much for coming to visit today! I believe your knee pain is due to a meniscal tear and that a change in gait due to pain resulted in your back pain. No signs of urine infection on labs today. We will check several other labs. I have given you a prescription for a knee brace, which you may take to a medical supply store. I will give you a prescription for pain medication which will not be refilled.  Please return if no relief. Thanks again! Dr. Gerlean Ren  Meniscus Tear A meniscus tear is a knee injury in which a piece of the meniscus is torn. The meniscus is a thick, rubbery, wedge-shaped cartilage in the knee. Two menisci are located in each knee. They sit between the upper bone (femur) and lower bone (tibia) that make up the knee joint. Each meniscus acts as a shock absorber for the knee. A torn meniscus is one of the most common types of knee injuries. This injury can range from mild to severe. Surgery may be needed for a severe tear. CAUSES This injury may be caused by any squatting, twisting, or pivoting movement. Sports-related injuries are the most common cause. These often occur from:  Running and stopping suddenly.  Changing direction.  Being tackled or knocked off your feet. As people get older, their meniscus gets thinner and weaker. In these people, tears can happen more easily, such as from climbing stairs.  RISK FACTORS This injury is more likely to happen to:  People who play contact sports.  Males.  People who are 74-44 years of age. SYMPTOMS  Symptoms of this injury include:  Knee pain, especially at the side of the knee joint. You may feel pain when the injury occurs, or you may only hear a pop and feel pain later.  A feeling that your knee is clicking, catching, locking, or giving way.  Not being able to fully bend or extend your knee.  Bruising or swelling in your knee. DIAGNOSIS  This injury may be diagnosed based on your symptoms and a physical  exam. The physical exam may include:  Moving your knee in different ways.  Feeling for tenderness.  Listening for a clicking sound.  Checking if your knee locks or catches. You may also have tests, such as:  X-rays.  MRI.  A procedure to look inside your knee with a narrow surgical telescope (arthroscopy). You may be referred to a knee specialist (orthopedic surgeon). TREATMENT  Treatment for this injury depends on the severity of the tear. Treatment for a mild tear may include:  Rest.  Medicine to reduce pain and swelling. This is usually a nonsteroidal anti-inflammatory drug (NSAID).  A knee brace or an elastic sleeve or wrap.  Using crutches or a walker to keep weight off your knee and to help you walk.  Exercises to strengthen your knee (physical therapy). You may need surgery if you have a severe tear or if other treatments are not working.  HOME CARE INSTRUCTIONS Managing Pain and Swelling  Take over-the-counter and prescription medicines only as told by your health care provider.  If directed, apply ice to the injured area:  Put ice in a plastic bag.  Place a towel between your skin and the bag.  Leave the ice on for 20 minutes, 2-3 times per day.  Raise (elevate) the injured area above the level of your heart while you are sitting or lying down. Activity  Do not use the injured limb to  support your body weight until your health care provider says that you can. Use crutches or a walker as told by your health care provider.  Return to your normal activities as told by your health care provider. Ask your health care provider what activities are safe for you.  Perform range-of-motion exercises only as told by your health care provider.  Begin doing exercises to strengthen your knee and leg muscles only as told by your health care provider. After you recover, your health care provider may recommend these exercises to help prevent another injury. General  Instructions  Use a knee brace or elastic wrap as told by your health care provider.  Keep all follow-up visits as told by your health care provider. This is important. SEEK MEDICAL CARE IF:  You have a fever.  Your knee becomes red, tender, or swollen.  Your pain medicine is not helping.  Your symptoms get worse or do not improve after 2 weeks of home care.   This information is not intended to replace advice given to you by your health care provider. Make sure you discuss any questions you have with your health care provider.   Document Released: 02/26/2003 Document Revised: 08/27/2015 Document Reviewed: 03/31/2015 Elsevier Interactive Patient Education [employer].

## 2016-04-07 ENCOUNTER — Ambulatory Visit: Payer: Medicaid Other | Attending: Oncology | Admitting: Physical Therapy

## 2016-04-07 ENCOUNTER — Other Ambulatory Visit: Payer: Medicaid Other

## 2016-04-07 DIAGNOSIS — I972 Postmastectomy lymphedema syndrome: Secondary | ICD-10-CM | POA: Diagnosis not present

## 2016-04-07 LAB — COMPLETE METABOLIC PANEL WITH GFR
ALBUMIN: 3.9 g/dL (ref 3.6–5.1)
ALK PHOS: 75 U/L (ref 33–130)
ALT: 19 U/L (ref 6–29)
AST: 23 U/L (ref 10–35)
BUN: 16 mg/dL (ref 7–25)
CHLORIDE: 104 mmol/L (ref 98–110)
CO2: 27 mmol/L (ref 20–31)
Calcium: 9.4 mg/dL (ref 8.6–10.4)
Creat: 1.07 mg/dL — ABNORMAL HIGH (ref 0.50–1.05)
GFR, EST NON AFRICAN AMERICAN: 60 mL/min (ref >=60)
GFR, Est African American: 69 mL/min (ref >=60)
GLUCOSE: 74 mg/dL (ref 65–99)
POTASSIUM: 4.1 mmol/L (ref 3.5–5.3)
SODIUM: 141 mmol/L (ref 135–146)
Total Bilirubin: 0.4 mg/dL (ref 0.2–1.2)
Total Protein: 6.9 g/dL (ref 6.1–8.1)
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
## 2016-04-08 LAB — VITAMIN D 25 HYDROXY (VIT D DEFICIENCY, FRACTURES): Vit D, 25-Hydroxy: 36 ng/mL (ref 30–100)

## 2016-04-08 NOTE — Progress Notes (Signed)
Subjective:     Patient ID: [name], female   DOB: 1961/07/22, 55 y.o.   MRN: [id number]  HPI Mrs. Goldman is a 55yo female presenting today for right knee pain. - Denies history of trauma or injury - Present for the past several days. No improvement. - Pain located over lateral right knee with extension down into shin. - Reports feeling of instability, feeling like her knee is going to collapse - Pain not improved with Ibuprofen. History of Methadone use for chronic pain, but she is almost out of medication. Denies pain contract prohibiting prescription of opioids from other providers. - Reports occasional left flank tenderness for last two weeks. Worried about her kidney. Requests labs to check kidneys. - Denies dysuria, hematuria, increased frequency, or increased urgency. Denies fever.  - Also reports history of vitamin D deficiency. Requests recheck today.  Review of Systems Per HPI. Other systems negative.     Objective:   Physical Exam  Constitutional: She appears well-developed and well-nourished. No distress.  Cardiovascular: Normal rate and regular rhythm.  Exam reveals no gallop and no friction rub.   No murmur heard. Pulmonary/Chest: Effort normal. No respiratory distress. She has no wheezes.  Abdominal: Soft. She exhibits no distension. There is no tenderness.  No CVA tenderness noted.  Musculoskeletal:  ROM in knees symmetrical bilaterally. No effusion bilaterally in knees.. Tenderness over lateral joint line of right knee. Anterior and posterior drawer symmetrical bilaterally. Lateral and medial collateral ligaments intact bilaterally. Positive Thessaly's on right. Antalgic gait noted with difficulty getting onto examining table.  Psychiatric: She has a normal mood and affect. Her behavior is normal.      Assessment and Plan:     1. Kidney dysfunction - History of kidney dysfunction, however occasional pain most likely musculoskeletal given changes in gait  secondary to right knee pain. - Urinalysis without signs of infection or hematuria - Will check CMP today  2. Right knee pain - Suspect secondary to meniscal tear given joint line tenderness and positive Thessaly's.  - Not interested in surgery. Will hold imaging or referral at this time - Rest knee if able - Prescription for brace given due to feelings of instability - Knee exercises given - Prescription for Vicodin given. Discussed that only 7 days worth of medication will be given and that no refills will be prescribed per clinic policy. - Follow up if symptoms worsen or fail to improve

## 2016-04-17 DIAGNOSIS — C50919 Malignant neoplasm of unspecified site of unspecified female breast: Secondary | ICD-10-CM | POA: Insufficient documentation

## 2016-04-17 DIAGNOSIS — E785 Hyperlipidemia, unspecified: Secondary | ICD-10-CM | POA: Insufficient documentation

## 2016-04-17 DIAGNOSIS — I10 Essential (primary) hypertension: Secondary | ICD-10-CM | POA: Insufficient documentation

## 2016-04-17 DIAGNOSIS — I2510 Atherosclerotic heart disease of native coronary artery without angina pectoris: Secondary | ICD-10-CM | POA: Insufficient documentation

## 2016-04-17 HISTORY — DX: Essential (primary) hypertension: I10

## 2016-04-21 ENCOUNTER — Ambulatory Visit: Payer: Medicaid Other

## 2016-04-22 ENCOUNTER — Other Ambulatory Visit (HOSPITAL_COMMUNITY): Payer: Self-pay | Admitting: General Surgery

## 2016-04-22 ENCOUNTER — Other Ambulatory Visit: Payer: Self-pay | Admitting: *Deleted

## 2016-04-22 DIAGNOSIS — I10 Essential (primary) hypertension: Secondary | ICD-10-CM

## 2016-04-22 DIAGNOSIS — R222 Localized swelling, mass and lump, trunk: Secondary | ICD-10-CM

## 2016-04-22 DIAGNOSIS — G8929 Other chronic pain: Secondary | ICD-10-CM

## 2016-04-22 DIAGNOSIS — Z853 Personal history of malignant neoplasm of breast: Secondary | ICD-10-CM

## 2016-04-22 DIAGNOSIS — M792 Neuralgia and neuritis, unspecified: Secondary | ICD-10-CM

## 2016-04-22 MED ORDER — METHADONE HCL 5 MG PO TABS: ORAL_TABLET | ORAL | Status: DC

## 2016-04-22 MED FILL — METHADONE HCL 5 MG TABLET: 5 | 30 days supply | Qty: 270 | Fill #0

## 2016-04-23 ENCOUNTER — Other Ambulatory Visit: Payer: Self-pay | Admitting: General Surgery

## 2016-04-23 DIAGNOSIS — R222 Localized swelling, mass and lump, trunk: Secondary | ICD-10-CM

## 2016-04-27 ENCOUNTER — Other Ambulatory Visit: Payer: Self-pay | Admitting: Nurse Practitioner

## 2016-04-27 ENCOUNTER — Other Ambulatory Visit: Payer: Self-pay | Admitting: General Surgery

## 2016-04-28 ENCOUNTER — Ambulatory Visit: Payer: Medicaid Other | Admitting: Physical Therapy

## 2016-05-05 ENCOUNTER — Ambulatory Visit: Payer: Medicaid Other | Admitting: Physical Therapy

## 2016-05-06 ENCOUNTER — Other Ambulatory Visit: Payer: Medicaid Other

## 2016-05-06 ENCOUNTER — Other Ambulatory Visit: Payer: Self-pay | Admitting: Nurse Practitioner

## 2016-05-06 ENCOUNTER — Telehealth: Payer: Self-pay | Admitting: *Deleted

## 2016-05-06 ENCOUNTER — Other Ambulatory Visit: Payer: Self-pay | Admitting: General Surgery

## 2016-05-06 DIAGNOSIS — R222 Localized swelling, mass and lump, trunk: Secondary | ICD-10-CM

## 2016-05-06 NOTE — Telephone Encounter (Signed)
Pt called with concerns about her Ultrasound/ Chest has been denied by her insurance and wants to know how to fix this. I will check into this matter and give pt a call back.

## 2016-05-07 NOTE — Telephone Encounter (Signed)
Pt was informed yesterday 5/18 by Barth Kirks to inform her that U/S of chest is scheduled for 5/24 and it has been approved.

## 2016-05-11 ENCOUNTER — Telehealth: Payer: Self-pay | Admitting: *Deleted

## 2016-05-11 NOTE — Telephone Encounter (Signed)
Pt informed and wants to see another MD to be evaluated.  Insist on early AM appointment [name] @ 9am). Charlise Giovanetti, Salome Spotted, CMA

## 2016-05-11 NOTE — Telephone Encounter (Signed)
The ibuprofen can be refilled. If she wants to be evaluated to see if any additional treatment is necessary she will need to be seen.

## 2016-05-11 NOTE — Telephone Encounter (Signed)
Patient calling asking if MD could refill her pain medication for her knee pain.

## 2016-05-12 ENCOUNTER — Ambulatory Visit (INDEPENDENT_AMBULATORY_CARE_PROVIDER_SITE_OTHER): Payer: Medicaid Other | Admitting: Family Medicine

## 2016-05-12 ENCOUNTER — Other Ambulatory Visit: Payer: Medicaid Other

## 2016-05-12 ENCOUNTER — Ambulatory Visit: Payer: Medicaid Other

## 2016-05-12 ENCOUNTER — Encounter: Payer: Self-pay | Admitting: Family Medicine

## 2016-05-12 ENCOUNTER — Ambulatory Visit: Payer: Medicaid Other | Admitting: Family Medicine

## 2016-05-12 VITALS — BP 114/77 | HR 68 | Temp 98.1°F | Ht 65.0 in | Wt 228.0 lb

## 2016-05-12 DIAGNOSIS — M25561 Pain in right knee: Secondary | ICD-10-CM

## 2016-05-12 MED ORDER — HYDROCODONE-ACETAMINOPHEN 10-300 MG PO TABS: 1.0000 | ORAL_TABLET | Freq: Four times a day (QID) | ORAL | Status: DC | PRN

## 2016-05-12 NOTE — Assessment & Plan Note (Signed)
Diffuse right knee pain greatest on lateral aspect with severe crepitus.  Most consistent with knee OA with possible degenerative meniscal injury is given bilateral joint line tenderness - Recommended x-rays to assess severity; patient declines - Recommended steroid injection to help with pain; patient declines - Provided refill of Vicodin # 30.  Long discussion about not continuing this chronically, but would provide her short-term relief to allow her to exercise and attempts to strengthen muscles and lose weight.  She is in agreement with this plan and states "this is the last time I will ask for a refill".  Also reports that she has joined a Biomedical engineer.  I encouraged her to do bicycling as well as water aerobics to help with her knee OA and pain.  - She reports she will work on weight loss - Recommend smoking cessation - Follow-up with PCP in one month to reassess

## 2016-05-12 NOTE — Patient Instructions (Signed)
Right knee pain is most likely due to osteoarthritis.  The 2 most effective treatments to help with this are exercise to strengthen the muscles around the knee and weight loss.  - I'm glad you will be joining the [facility]; consider doing recumbent bicycle as well as water aerobics on water walking- as these to exercise, have shown to be beneficial with people with knee osteoarthritis  - Follow-up with Dr. Gerlean Ren in one month

## 2016-05-12 NOTE — Progress Notes (Signed)
   Subjective:    Patient ID: [name], female    DOB: Jan 26, 1961, 55 y.o.   MRN: [id number]  Seen for Same day visit for   CC: right knee pian  She comes in today with residual right knee pain.  Reports that she has had arthritis in this knee for several years.  Was recently seen in clinic a month ago and given a short-term prescription of Vicodin, which have helped.  She reports right lateral knee pain radiating to right shin.  Has some feelings of unsteadiness; the knee has not given way and does not have locking.  She denies any fevers, chills.  Denies any recent trauma or remote surgeries.  She denies any swelling.  She is current smoker.  Reports long history of cheerleading as well as been in [employer], which she associates with return of her knee.  She does report a history of cocaine use.  Reports she no longer uses any illicit drugs other than marijuana.  Has used ibuprofen in the past her knee but this causes stomach pain.  Denies history of GI bleed.  Denies history of blood clots.   Smoking history noted  Review of Systems   See HPI for ROS. Objective:  BP 114/77 mmHg  Pulse 68  Temp(Src) 98.1 F (36.7 C) (Oral)  Ht 5\' 5"  (1.651 m)  Wt 228 lb (103.42 kg)  BMI 37.94 kg/m2  SpO2 97%  LMP 07/16/2011  General: NAD Cardiac: RRR, normal heart sounds, no murmurs Respiratory: CTAB, normal effort Right Knee: Inspection: No erythema; No Bruising; No posterior sag Palpation:  No warmth; mild effusion; Patellar glide witt severe crepitus; Diffuse Tenderness anterior, lateral and medial knee with bilateral joint line tenderness ROM: Extension limited to 175 degree  Ligaments: Solid with consistent endpoints including ACL, PCL, LCL, MCL (Negative Lachman's / Anterior drawer; Posterior drawer) Patellar and quadriceps tendons unremarkable. Hamstring and quadriceps strength is normal.  Dorsalis pedis pulse 2+ No Calf tenderness or swelling Assessment & Plan:   Right knee  pain Diffuse right knee pain greatest on lateral aspect with severe crepitus.  Most consistent with knee OA with possible degenerative meniscal injury is given bilateral joint line tenderness - Recommended x-rays to assess severity; patient declines - Recommended steroid injection to help with pain; patient declines - Provided refill of Vicodin # 30.  Long discussion about not continuing this chronically, but would provide her short-term relief to allow her to exercise and attempts to strengthen muscles and lose weight.  She is in agreement with this plan and states "this is the last time I will ask for a refill".  Also reports that she has joined a Biomedical engineer.  I encouraged her to do bicycling as well as water aerobics to help with her knee OA and pain.  - She reports she will work on weight loss - Recommend smoking cessation - Follow-up with PCP in one month to reassess

## 2016-05-18 ENCOUNTER — Other Ambulatory Visit: Payer: Medicaid Other

## 2016-05-19 ENCOUNTER — Other Ambulatory Visit: Payer: Self-pay | Admitting: Family Medicine

## 2016-05-19 ENCOUNTER — Other Ambulatory Visit: Payer: Self-pay | Admitting: *Deleted

## 2016-05-19 DIAGNOSIS — Z853 Personal history of malignant neoplasm of breast: Secondary | ICD-10-CM

## 2016-05-19 DIAGNOSIS — M792 Neuralgia and neuritis, unspecified: Secondary | ICD-10-CM

## 2016-05-19 DIAGNOSIS — I10 Essential (primary) hypertension: Secondary | ICD-10-CM

## 2016-05-19 DIAGNOSIS — G8929 Other chronic pain: Secondary | ICD-10-CM

## 2016-05-19 MED ORDER — METHADONE HCL 5 MG PO TABS: ORAL_TABLET | ORAL | Status: DC

## 2016-05-20 MED FILL — METHADONE HCL 5 MG TABLET: 5 | 30 days supply | Qty: 270 | Fill #0

## 2016-05-24 ENCOUNTER — Other Ambulatory Visit: Payer: Medicaid Other

## 2016-05-24 ENCOUNTER — Ambulatory Visit
Admission: RE | Admit: 2016-05-24 | Discharge: 2016-05-24 | Disposition: A | Discharge status: Home / Self Care | Payer: Medicaid Other | Source: Ambulatory Visit | Attending: General Surgery | Admitting: General Surgery

## 2016-05-24 DIAGNOSIS — R222 Localized swelling, mass and lump, trunk: Secondary | ICD-10-CM

## 2016-05-26 ENCOUNTER — Ambulatory Visit: Payer: Medicaid Other | Admitting: Family Medicine

## 2016-06-07 ENCOUNTER — Encounter: Payer: Self-pay | Admitting: Family Medicine

## 2016-06-07 ENCOUNTER — Ambulatory Visit (INDEPENDENT_AMBULATORY_CARE_PROVIDER_SITE_OTHER): Payer: Medicaid Other | Admitting: Family Medicine

## 2016-06-07 VITALS — BP 110/65 | HR 74 | Temp 98.5°F | Wt 226.0 lb

## 2016-06-07 DIAGNOSIS — M25561 Pain in right knee: Secondary | ICD-10-CM | POA: Diagnosis present

## 2016-06-07 DIAGNOSIS — R519 Headache, unspecified: Secondary | ICD-10-CM

## 2016-06-07 DIAGNOSIS — R51 Headache: Secondary | ICD-10-CM

## 2016-06-07 MED ORDER — MELOXICAM 15 MG PO TABS: 15.0000 mg | ORAL_TABLET | Freq: Every day | ORAL | Status: DC

## 2016-06-07 NOTE — Progress Notes (Signed)
Subjective:     Patient ID: [name], female   DOB: [birth year], 55 y.o.   MRN: [id number]  HPI [name] is a 55yo female presenting today for knee pain follow up and headache.  # Knee Pain: - Recently seen on 5/24 for right knee pain. Most likely Knee Osteoarthritis with possible meniscal injury. Declined Xrays and Steroid Injection. Refill of Vicodin #30 given with discussion that medication would not be refilled. - Reports mild improvement since last office visit - Pain located over anterior right knee - Notes some unsteadiness and occasional giving out. Denies locking. - Has been using Vicodin with some relief of pain. States she knows that this cannot be refilled, but requests Percocet instead. - Has previously refused Injection and Imaging. Amenable to Imaging today.  # Headache: - Reports sharp pain over bilateral occipital bone daily for a few weeks - Reports it wakes her up at night, even without movement - Worse with breathing and when laying on her left side - Denies numbness, tingling, weakness - Has tried Aspirin and Tylenol, which do no help - History of breast cancer. She is concerned about possible metastasis. - MRI Brain without metastasis noted in 10/2015  - Smoker  Review of Systems Per HPI. Other systems negative.    Objective:   Physical Exam  Constitutional: She appears well-developed and well-nourished. No distress.  HENT:  Head: Normocephalic and atraumatic.  Cardiovascular: Normal rate and regular rhythm.   No murmur heard. Pulmonary/Chest: Effort normal. No respiratory distress. She has no wheezes.  Musculoskeletal: She exhibits no edema.  ROM in knees symmetrical bilaterally. Mild effusion in right knee. Tenderness over lateral joint line of right knee. Severe crepitus in right knee. Anterior and posterior drawer symmetrical bilaterally. Lateral and medial collateral ligaments intact bilaterally. Positive Thessaly's on right. Muscle strength 5/5  in upper and lower extremities.  Neurological: No cranial nerve deficit.  Sensation intact over upper and lower extremities.  Psychiatric: Her behavior is normal.      Assessment and Plan:     1. Right knee pain - Suspect osteoarthritis with meniscal tear. - Agreeable to Xrays today - Counseled against chronic opioids. Agreeable to initiating Mobic instead.  2. Acute nonintractable headache, unspecified headache type - Given history of malignancy and patient concern of metastasis as well as red flag symptoms (waking from sleep, positional dependent), will obtain MRI with and without contrast. - Return and ED precautions given - Return pending results of imaging

## 2016-06-07 NOTE — Patient Instructions (Signed)
Thank you so much for coming to visit me today! I have sent a prescription for your knee pain to the pharmacy. Please do not take Ibuprofen or Aleve with this. Please let us know if you experience any abdominal pain or dark stools with this medication. I have also ordered xrays. Follow up in 65month.  We will get an MRI to evaluate your headache.  Please call [phone] if you develop any weakness or numbness.  Thanks again! Dr. Gerlean Ren

## 2016-06-10 ENCOUNTER — Telehealth: Payer: Self-pay | Admitting: *Deleted

## 2016-06-10 NOTE — Telephone Encounter (Signed)
Patient called wanting to thank Dr. Gerlean Ren for the prescribing her Mobic for her pain.  She stated she is not having any pain right now.  Her brace for her will be available soon.  Patient was very appreciative of the services at [facility].  Derl Barrow, RN

## 2016-06-11 ENCOUNTER — Ambulatory Visit (HOSPITAL_COMMUNITY): Admission: RE | Admit: 2016-06-11 | Payer: Medicaid Other | Source: Ambulatory Visit

## 2016-06-14 ENCOUNTER — Ambulatory Visit (HOSPITAL_COMMUNITY)
Admission: RE | Admit: 2016-06-14 | Discharge: 2016-06-14 | Disposition: A | Discharge status: Home / Self Care | Payer: Medicaid Other | Source: Ambulatory Visit | Attending: Family Medicine | Admitting: Family Medicine

## 2016-06-14 ENCOUNTER — Ambulatory Visit (HOSPITAL_COMMUNITY): Payer: Medicaid Other

## 2016-06-14 DIAGNOSIS — R519 Headache, unspecified: Secondary | ICD-10-CM

## 2016-06-14 DIAGNOSIS — R51 Headache: Secondary | ICD-10-CM | POA: Diagnosis present

## 2016-06-14 LAB — POCT I-STAT CREATININE: Creatinine, Ser: 0.9 mg/dL (ref 0.44–1.00)

## 2016-06-14 MED ORDER — GADOBENATE DIMEGLUMINE 529 MG/ML IV SOLN
20.0000 mL | Freq: Once | INTRAVENOUS | Status: AC | PRN
  Administered 2016-06-14: 20 mL via INTRAVENOUS

## 2016-06-15 ENCOUNTER — Telehealth: Payer: Self-pay | Admitting: *Deleted

## 2016-06-15 NOTE — Telephone Encounter (Signed)
Pt left voice message with request for Methadone refill. Pt will pick up on Thursday  06/18/16. Returned call to pt, notified pt rx will be available for pick up Friday 6/30. Desk RN notified, rx will be printed for pt upon her arrival. No further concerns.

## 2016-06-16 ENCOUNTER — Ambulatory Visit: Payer: Medicaid Other | Admitting: Physical Therapy

## 2016-06-17 ENCOUNTER — Other Ambulatory Visit: Payer: Self-pay | Admitting: *Deleted

## 2016-06-17 DIAGNOSIS — I10 Essential (primary) hypertension: Secondary | ICD-10-CM

## 2016-06-17 DIAGNOSIS — M792 Neuralgia and neuritis, unspecified: Secondary | ICD-10-CM

## 2016-06-17 DIAGNOSIS — Z853 Personal history of malignant neoplasm of breast: Secondary | ICD-10-CM

## 2016-06-17 DIAGNOSIS — G8929 Other chronic pain: Secondary | ICD-10-CM

## 2016-06-17 MED ORDER — METHADONE HCL 5 MG PO TABS: ORAL_TABLET | ORAL | Status: DC

## 2016-06-18 MED FILL — METHADONE HCL 5 MG TABLET: 5 | 30 days supply | Qty: 270 | Fill #0

## 2016-07-07 ENCOUNTER — Ambulatory Visit: Payer: Medicaid Other | Admitting: Physical Therapy

## 2016-07-11 NOTE — Progress Notes (Deleted)
Subjective:     Patient ID: [name], female   DOB: 30-Dec-1960, 55 y.o.   MRN: [id number]  HPI [name] is a 55yo female presenting today for knee pain. - Previously seen for same complaint on 06/07/16. Suspected right knee osteoarthritis with possible meniscal tear. History of requesting Vicodin and Percocet. Refuses Injection. Prescription for Mobic 15mg  given. - Knee films ordered. Patient has not had these done. - Telephone call on 06/10/16 reporting Mobic is working very well with no pain in right knee. - Now stating that Mobic does not relieve pain.  Review of Systems     Objective:   Physical Exam     Assessment and Plan:     ***

## 2016-07-12 ENCOUNTER — Ambulatory Visit: Payer: Medicaid Other | Admitting: Family Medicine

## 2016-07-13 ENCOUNTER — Ambulatory Visit: Payer: Medicaid Other | Admitting: Obstetrics and Gynecology

## 2016-07-15 ENCOUNTER — Telehealth: Payer: Self-pay | Admitting: *Deleted

## 2016-07-15 ENCOUNTER — Other Ambulatory Visit: Payer: Self-pay | Admitting: *Deleted

## 2016-07-15 DIAGNOSIS — I10 Essential (primary) hypertension: Secondary | ICD-10-CM

## 2016-07-15 DIAGNOSIS — G8929 Other chronic pain: Secondary | ICD-10-CM

## 2016-07-15 DIAGNOSIS — Z853 Personal history of malignant neoplasm of breast: Secondary | ICD-10-CM

## 2016-07-15 DIAGNOSIS — M792 Neuralgia and neuritis, unspecified: Secondary | ICD-10-CM

## 2016-07-15 MED ORDER — METHADONE HCL 5 MG PO TABS: ORAL_TABLET | ORAL | 0 refills | Status: DC

## 2016-07-15 NOTE — Telephone Encounter (Signed)
TC from patient stating that she is on her way to pick up her prescription for Methadone.  Val, RN with Dr. [name] aware.

## 2016-07-16 NOTE — Telephone Encounter (Signed)
Script obtained and given to pt

## 2016-07-26 ENCOUNTER — Encounter: Payer: Self-pay | Admitting: Adult Health

## 2016-07-26 ENCOUNTER — Telehealth: Payer: Self-pay | Admitting: Adult Health

## 2016-07-26 ENCOUNTER — Other Ambulatory Visit (HOSPITAL_BASED_OUTPATIENT_CLINIC_OR_DEPARTMENT_OTHER): Payer: Medicaid Other

## 2016-07-26 ENCOUNTER — Ambulatory Visit (HOSPITAL_BASED_OUTPATIENT_CLINIC_OR_DEPARTMENT_OTHER): Payer: Medicaid Other | Admitting: Adult Health

## 2016-07-26 VITALS — BP 148/96 | HR 58 | Temp 98.5°F | Resp 18 | Ht 65.0 in | Wt 222.8 lb

## 2016-07-26 DIAGNOSIS — D473 Essential (hemorrhagic) thrombocythemia: Secondary | ICD-10-CM

## 2016-07-26 DIAGNOSIS — R51 Headache: Secondary | ICD-10-CM | POA: Diagnosis not present

## 2016-07-26 DIAGNOSIS — C50912 Malignant neoplasm of unspecified site of left female breast: Principal | ICD-10-CM

## 2016-07-26 DIAGNOSIS — I10 Essential (primary) hypertension: Secondary | ICD-10-CM

## 2016-07-26 DIAGNOSIS — C50412 Malignant neoplasm of upper-outer quadrant of left female breast: Secondary | ICD-10-CM

## 2016-07-26 DIAGNOSIS — E876 Hypokalemia: Secondary | ICD-10-CM

## 2016-07-26 DIAGNOSIS — Z170 Estrogen receptor positive status [ER+]: Secondary | ICD-10-CM | POA: Diagnosis not present

## 2016-07-26 DIAGNOSIS — D75839 Thrombocytosis, unspecified: Secondary | ICD-10-CM

## 2016-07-26 DIAGNOSIS — C50911 Malignant neoplasm of unspecified site of right female breast: Secondary | ICD-10-CM

## 2016-07-26 DIAGNOSIS — Z720 Tobacco use: Secondary | ICD-10-CM

## 2016-07-26 LAB — COMPREHENSIVE METABOLIC PANEL
ALBUMIN: 3.5 g/dL (ref 3.5–5.0)
ALK PHOS: 74 U/L (ref 40–150)
ALT: 19 U/L (ref 0–55)
ANION GAP: 11 meq/L (ref 3–11)
AST: 21 U/L (ref 5–34)
BUN: 12 mg/dL (ref 7.0–26.0)
CALCIUM: 9.6 mg/dL (ref 8.4–10.4)
CHLORIDE: 109 meq/L (ref 98–109)
CO2: 23 mEq/L (ref 22–29)
CREATININE: 0.9 mg/dL (ref 0.6–1.1)
EGFR: 84 mL/min/{1.73_m2} — ABNORMAL LOW (ref >90)
Glucose: 105 mg/dl (ref 70–140)
POTASSIUM: 3.2 meq/L — AB (ref 3.5–5.1)
Sodium: 142 mEq/L (ref 136–145)
Total Bilirubin: 0.44 mg/dL (ref 0.20–1.20)
Total Protein: 6.8 g/dL (ref 6.4–8.3)

## 2016-07-26 LAB — CBC WITH DIFFERENTIAL/PLATELET
BASO%: 0.3 % (ref 0.0–2.0)
BASOS ABS: 0 10*3/uL (ref 0.0–0.1)
EOS%: 1.9 % (ref 0.0–7.0)
Eosinophils Absolute: 0.1 10*3/uL (ref 0.0–0.5)
HEMATOCRIT: 41.9 % (ref 34.8–46.6)
HGB: 14.1 g/dL (ref 11.6–15.9)
LYMPH#: 1.6 10*3/uL (ref 0.9–3.3)
LYMPH%: 24.1 % (ref 14.0–49.7)
MCH: 28.9 pg (ref 25.1–34.0)
MCHC: 33.7 g/dL (ref 31.5–36.0)
MCV: 85.9 fL (ref 79.5–101.0)
MONO#: 0.5 10*3/uL (ref 0.1–0.9)
MONO%: 7.8 % (ref 0.0–14.0)
NEUT#: 4.5 10*3/uL (ref 1.5–6.5)
NEUT%: 65.9 % (ref 38.4–76.8)
PLATELETS: 505 10*3/uL — AB (ref 145–400)
RBC: 4.88 10*6/uL (ref 3.70–5.45)
RDW: 15.9 % — ABNORMAL HIGH (ref 11.2–14.5)
WBC: 6.8 10*3/uL (ref 3.9–10.3)

## 2016-07-26 LAB — FERRITIN: Ferritin: 93 ng/ml (ref 9–269)

## 2016-07-26 NOTE — Telephone Encounter (Signed)
Gv apt appts for 08/09/16 + 07/25/17 + 08/01/17.
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
## 2016-07-27 ENCOUNTER — Ambulatory Visit: Payer: Medicaid Other | Attending: Oncology | Admitting: Physical Therapy

## 2016-07-27 DIAGNOSIS — I972 Postmastectomy lymphedema syndrome: Secondary | ICD-10-CM | POA: Diagnosis not present

## 2016-07-27 LAB — C-REACTIVE PROTEIN: CRP: 2.7 mg/L (ref 0.0–4.9)
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
## 2016-07-29 ENCOUNTER — Ambulatory Visit: Payer: Medicaid Other | Admitting: Adult Health

## 2016-07-29 ENCOUNTER — Ambulatory Visit: Payer: Medicaid Other | Admitting: Obstetrics and Gynecology

## 2016-08-02 ENCOUNTER — Other Ambulatory Visit: Payer: Self-pay | Admitting: Internal Medicine

## 2016-08-02 ENCOUNTER — Ambulatory Visit: Payer: Medicaid Other | Admitting: Nurse Practitioner

## 2016-08-05 NOTE — Progress Notes (Deleted)
Subjective:     Patient ID: [name], female   DOB: March 18, 1961, 55 y.o.   MRN: [id number]  HPI [name] is a 55yo female presenting for knee pain follow up. - Knee films ordered, but patient has not gone to have these done. - Previously seen on 06/07/16. Mobic prescribed, which patient reported in telephone call on 06/10/16 to have completely resolved her pain.  Review of Systems     Objective:   Physical Exam     Assessment and Plan:     ***

## 2016-08-06 ENCOUNTER — Ambulatory Visit: Payer: Medicaid Other | Admitting: Family Medicine

## 2016-08-09 ENCOUNTER — Other Ambulatory Visit: Payer: Medicaid Other

## 2016-08-09 ENCOUNTER — Telehealth: Payer: Self-pay | Admitting: Oncology

## 2016-08-09 ENCOUNTER — Telehealth: Payer: Self-pay | Admitting: *Deleted

## 2016-08-09 DIAGNOSIS — Z853 Personal history of malignant neoplasm of breast: Secondary | ICD-10-CM

## 2016-08-09 DIAGNOSIS — M792 Neuralgia and neuritis, unspecified: Secondary | ICD-10-CM

## 2016-08-09 DIAGNOSIS — G8929 Other chronic pain: Secondary | ICD-10-CM

## 2016-08-09 DIAGNOSIS — I10 Essential (primary) hypertension: Secondary | ICD-10-CM

## 2016-08-09 MED ORDER — METHADONE HCL 5 MG PO TABS: ORAL_TABLET | ORAL | 0 refills | Status: DC

## 2016-08-09 NOTE — Telephone Encounter (Signed)
08/09/2016 Appointment rescheduled to 08/13/2016 per patient request. Patient could not attend original scheduled appointment and left a voicemail to reschedule.

## 2016-08-09 NOTE — Telephone Encounter (Signed)
Received vm call from pt stating that she would like a refill on her methadone to p/u this fri.  Called pt to remind her of lab appt today & she wants to r/s to this fri.  Message left with [facility] to r/s.  Will get script ready for p/u on fri.

## 2016-08-13 ENCOUNTER — Other Ambulatory Visit (HOSPITAL_BASED_OUTPATIENT_CLINIC_OR_DEPARTMENT_OTHER): Payer: Medicaid Other

## 2016-08-13 ENCOUNTER — Ambulatory Visit: Payer: Medicaid Other | Admitting: Family Medicine

## 2016-08-13 DIAGNOSIS — E876 Hypokalemia: Secondary | ICD-10-CM

## 2016-08-13 DIAGNOSIS — C50911 Malignant neoplasm of unspecified site of right female breast: Secondary | ICD-10-CM

## 2016-08-13 DIAGNOSIS — C50912 Malignant neoplasm of unspecified site of left female breast: Secondary | ICD-10-CM

## 2016-08-13 DIAGNOSIS — C50412 Malignant neoplasm of upper-outer quadrant of left female breast: Secondary | ICD-10-CM

## 2016-08-13 LAB — CBC WITH DIFFERENTIAL/PLATELET
BASO%: 0.2 % (ref 0.0–2.0)
Basophils Absolute: 0 10*3/uL (ref 0.0–0.1)
EOS%: 1.1 % (ref 0.0–7.0)
Eosinophils Absolute: 0.1 10*3/uL (ref 0.0–0.5)
HEMATOCRIT: 41.1 % (ref 34.8–46.6)
HGB: 14.2 g/dL (ref 11.6–15.9)
LYMPH#: 1.8 10*3/uL (ref 0.9–3.3)
LYMPH%: 18.8 % (ref 14.0–49.7)
MCH: 29.5 pg (ref 25.1–34.0)
MCHC: 34.5 g/dL (ref 31.5–36.0)
MCV: 85.3 fL (ref 79.5–101.0)
MONO#: 0.7 10*3/uL (ref 0.1–0.9)
MONO%: 6.7 % (ref 0.0–14.0)
NEUT#: 7.1 10*3/uL — ABNORMAL HIGH (ref 1.5–6.5)
NEUT%: 73.2 % (ref 38.4–76.8)
Platelets: 529 10*3/uL — ABNORMAL HIGH (ref 145–400)
RBC: 4.82 10*6/uL (ref 3.70–5.45)
RDW: 15.9 % — AB (ref 11.2–14.5)
WBC: 9.7 10*3/uL (ref 3.9–10.3)

## 2016-08-13 LAB — COMPREHENSIVE METABOLIC PANEL
ALT: 18 U/L (ref 0–55)
AST: 22 U/L (ref 5–34)
Albumin: 3.5 g/dL (ref 3.5–5.0)
Alkaline Phosphatase: 74 U/L (ref 40–150)
Anion Gap: 10 mEq/L (ref 3–11)
BUN: 13.1 mg/dL (ref 7.0–26.0)
CALCIUM: 9.5 mg/dL (ref 8.4–10.4)
CHLORIDE: 107 meq/L (ref 98–109)
CO2: 22 meq/L (ref 22–29)
CREATININE: 0.9 mg/dL (ref 0.6–1.1)
EGFR: 87 mL/min/{1.73_m2} — ABNORMAL LOW (ref >90)
GLUCOSE: 116 mg/dL (ref 70–140)
POTASSIUM: 3.2 meq/L — AB (ref 3.5–5.1)
SODIUM: 139 meq/L (ref 136–145)
Total Bilirubin: 0.47 mg/dL (ref 0.20–1.20)
Total Protein: 7.1 g/dL (ref 6.4–8.3)

## 2016-08-13 MED FILL — METHADONE HCL 5 MG TABLET: 5 | 30 days supply | Qty: 270 | Fill #0

## 2016-08-19 NOTE — Progress Notes (Deleted)
Subjective:     Patient ID: [name], female   DOB: 1961-03-25, 55 y.o.   MRN: [id number]  HPI Mrs. Thaggard is a 55yo female presenting today for knee pain.  Review of Systems     Objective:   Physical Exam     Assessment:     ***    Plan:     ***

## 2016-08-20 ENCOUNTER — Ambulatory Visit: Payer: Medicaid Other | Admitting: Family Medicine

## 2016-08-20 ENCOUNTER — Emergency Department (HOSPITAL_COMMUNITY)
Admission: EM | Admit: 2016-08-20 | Discharge: 2016-08-21 | Disposition: A | Discharge status: Home / Self Care | Payer: Medicaid Other | Attending: Emergency Medicine | Admitting: Emergency Medicine

## 2016-08-20 ENCOUNTER — Encounter (HOSPITAL_COMMUNITY): Payer: Self-pay | Admitting: Emergency Medicine

## 2016-08-20 ENCOUNTER — Other Ambulatory Visit: Payer: Self-pay | Admitting: Internal Medicine

## 2016-08-20 ENCOUNTER — Emergency Department (HOSPITAL_COMMUNITY): Payer: Medicaid Other

## 2016-08-20 DIAGNOSIS — Y999 Unspecified external cause status: Secondary | ICD-10-CM | POA: Diagnosis not present

## 2016-08-20 DIAGNOSIS — Y92410 Unspecified street and highway as the place of occurrence of the external cause: Secondary | ICD-10-CM | POA: Insufficient documentation

## 2016-08-20 DIAGNOSIS — M79622 Pain in left upper arm: Secondary | ICD-10-CM | POA: Diagnosis not present

## 2016-08-20 DIAGNOSIS — R079 Chest pain, unspecified: Secondary | ICD-10-CM | POA: Insufficient documentation

## 2016-08-20 DIAGNOSIS — Z7982 Long term (current) use of aspirin: Secondary | ICD-10-CM | POA: Diagnosis not present

## 2016-08-20 DIAGNOSIS — Y9389 Activity, other specified: Secondary | ICD-10-CM | POA: Insufficient documentation

## 2016-08-20 DIAGNOSIS — F17210 Nicotine dependence, cigarettes, uncomplicated: Secondary | ICD-10-CM | POA: Insufficient documentation

## 2016-08-20 DIAGNOSIS — M79602 Pain in left arm: Secondary | ICD-10-CM

## 2016-08-20 DIAGNOSIS — Z853 Personal history of malignant neoplasm of breast: Secondary | ICD-10-CM | POA: Diagnosis not present

## 2016-08-20 DIAGNOSIS — I10 Essential (primary) hypertension: Secondary | ICD-10-CM | POA: Insufficient documentation

## 2016-08-20 DIAGNOSIS — I2510 Atherosclerotic heart disease of native coronary artery without angina pectoris: Secondary | ICD-10-CM | POA: Diagnosis not present

## 2016-08-20 DIAGNOSIS — Z79899 Other long term (current) drug therapy: Secondary | ICD-10-CM | POA: Insufficient documentation

## 2016-08-20 LAB — I-STAT TROPONIN, ED: TROPONIN I, POC: 0 ng/mL (ref 0.00–0.08)

## 2016-08-20 LAB — BASIC METABOLIC PANEL
ANION GAP: 8 (ref 5–15)
BUN: 12 mg/dL (ref 6–20)
CO2: 23 mmol/L (ref 22–32)
Calcium: 9.4 mg/dL (ref 8.9–10.3)
Chloride: 104 mmol/L (ref 101–111)
Creatinine, Ser: 0.92 mg/dL (ref 0.44–1.00)
GFR calc Af Amer: >60 mL/min (ref >60)
Glucose, Bld: 99 mg/dL (ref 65–99)
POTASSIUM: 3.1 mmol/L — AB (ref 3.5–5.1)
SODIUM: 135 mmol/L (ref 135–145)

## 2016-08-20 LAB — CBC
HEMATOCRIT: 42.1 % (ref 36.0–46.0)
Hemoglobin: 15 g/dL (ref 12.0–15.0)
MCH: 29.5 pg (ref 26.0–34.0)
MCHC: 35.6 g/dL (ref 30.0–36.0)
MCV: 82.7 fL (ref 78.0–100.0)
PLATELETS: 493 10*3/uL — AB (ref 150–400)
RBC: 5.09 MIL/uL (ref 3.87–5.11)
RDW: 15.5 % (ref 11.5–15.5)
WBC: 11.4 10*3/uL — AB (ref 4.0–10.5)

## 2016-08-20 MED ORDER — MORPHINE SULFATE (PF) 4 MG/ML IV SOLN
4.0000 mg | Freq: Once | INTRAVENOUS | Status: AC
  Administered 2016-08-20: 4 mg via INTRAVENOUS
  Filled 2016-08-20: qty 1

## 2016-08-20 MED ORDER — POTASSIUM CHLORIDE CRYS ER 20 MEQ PO TBCR
40.0000 meq | EXTENDED_RELEASE_TABLET | Freq: Once | ORAL | Status: AC
  Administered 2016-08-21: 40 meq via ORAL
  Filled 2016-08-20: qty 2

## 2016-08-20 MED ORDER — SODIUM CHLORIDE 0.9 % IV SOLN
Freq: Once | INTRAVENOUS | Status: AC
  Administered 2016-08-20: via INTRAVENOUS

## 2016-08-20 MED ORDER — ONDANSETRON HCL 4 MG/2ML IJ SOLN
4.0000 mg | Freq: Once | INTRAMUSCULAR | Status: AC
  Administered 2016-08-20: 4 mg via INTRAVENOUS
  Filled 2016-08-20: qty 2

## 2016-08-20 NOTE — ED Triage Notes (Signed)
Pt c/o L sided CP onset 1 hour ago, +shob, denies n/v. Pt also c/o pain to back and L arm. Pt states she does have cellulitis to L forearm.
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
## 2016-08-21 ENCOUNTER — Emergency Department (HOSPITAL_COMMUNITY): Payer: Medicaid Other

## 2016-08-21 LAB — I-STAT TROPONIN, ED: Troponin i, poc: 0 ng/mL (ref 0.00–0.08)

## 2016-08-21 MED ORDER — CEPHALEXIN 500 MG PO CAPS: 500.0000 mg | ORAL_CAPSULE | Freq: Four times a day (QID) | ORAL | 0 refills | Status: AC

## 2016-08-21 MED ORDER — CEFAZOLIN IN D5W 1 GM/50ML IV SOLN
1.0000 g | Freq: Once | INTRAVENOUS | Status: AC
Start: 2016-08-21 — End: 2016-08-21
  Administered 2016-08-21: 1 g via INTRAVENOUS
  Filled 2016-08-21: qty 50
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
## 2016-09-01 ENCOUNTER — Ambulatory Visit (INDEPENDENT_AMBULATORY_CARE_PROVIDER_SITE_OTHER): Payer: Medicaid Other | Admitting: Family Medicine

## 2016-09-01 ENCOUNTER — Encounter: Payer: Self-pay | Admitting: Family Medicine

## 2016-09-01 VITALS — BP 106/75 | HR 63 | Temp 98.2°F | Ht 65.0 in | Wt 216.8 lb

## 2016-09-01 DIAGNOSIS — R7309 Other abnormal glucose: Secondary | ICD-10-CM

## 2016-09-01 DIAGNOSIS — E876 Hypokalemia: Secondary | ICD-10-CM | POA: Diagnosis not present

## 2016-09-01 DIAGNOSIS — Z1211 Encounter for screening for malignant neoplasm of colon: Secondary | ICD-10-CM

## 2016-09-01 DIAGNOSIS — M25561 Pain in right knee: Secondary | ICD-10-CM | POA: Diagnosis present

## 2016-09-01 DIAGNOSIS — F17200 Nicotine dependence, unspecified, uncomplicated: Secondary | ICD-10-CM | POA: Diagnosis not present

## 2016-09-01 DIAGNOSIS — R739 Hyperglycemia, unspecified: Secondary | ICD-10-CM

## 2016-09-01 LAB — BASIC METABOLIC PANEL WITH GFR
BUN: 15 mg/dL (ref 7–25)
CHLORIDE: 105 mmol/L (ref 98–110)
CO2: 28 mmol/L (ref 20–31)
Calcium: 9.8 mg/dL (ref 8.6–10.4)
Creat: 1 mg/dL (ref 0.50–1.05)
GFR, EST NON AFRICAN AMERICAN: 65 mL/min (ref >=60)
GFR, Est African American: 75 mL/min (ref >=60)
Glucose, Bld: 91 mg/dL (ref 65–99)
POTASSIUM: 4.3 mmol/L (ref 3.5–5.3)
SODIUM: 141 mmol/L (ref 135–146)

## 2016-09-01 LAB — POCT GLYCOSYLATED HEMOGLOBIN (HGB A1C): HEMOGLOBIN A1C: 5.8

## 2016-09-01 NOTE — Patient Instructions (Signed)
Thank you so much for coming to visit today! We will check several labs today including A1C and BMP to check your potassium. I have placed a referral to Orthopedics for your knee pain. Please go for your knee xrays. I have placed a referral to GI for your colonoscopy. Please keep your appointment with Cardiology on 9/27 Please wear Nicotine patches and continue to work on smoking cessation. Follow up pending lab results.  Dr. Gerlean Ren
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
## 2016-09-02 ENCOUNTER — Encounter: Payer: Self-pay | Admitting: Gastroenterology

## 2016-09-03 ENCOUNTER — Other Ambulatory Visit: Payer: Self-pay | Admitting: Family Medicine

## 2016-09-04 ENCOUNTER — Other Ambulatory Visit: Payer: Self-pay | Admitting: Internal Medicine

## 2016-09-04 ENCOUNTER — Telehealth: Payer: Self-pay | Admitting: Internal Medicine

## 2016-09-04 MED ORDER — POTASSIUM CHLORIDE CRYS ER 20 MEQ PO TBCR: EXTENDED_RELEASE_TABLET | ORAL | 3 refills | Status: DC

## 2016-09-04 NOTE — Telephone Encounter (Signed)
I received an incoming telephone call from [name] at 1:37 PM on 09/04/2016 requesting a refill of her Klor-Con 68mEq daily to be sent to [place].  Office Notes from her PCP on 9/13 reviewed with potassium of 4.3 and previously 3.1 on 08/20/2016.   I have sent in a refill for her Klor-Con but I have instructed patient to hold off on taking this medication until she speaks with her PCP on Monday 9/18 for further instruction about dosing or stopping the supplementation.  She expressed understanding and states she will call Dr. Gerlean Ren on Monday.  [name], DO 09/04/2016, 1:43 PM

## 2016-09-04 NOTE — Progress Notes (Signed)
I received an incoming telephone call from [name] at 1:37 PM on 09/04/2016 requesting a refill of her Klor-Con 18mEq daily to be sent to [place].  Office Notes from her PCP on 9/13 reviewed with potassium of 4.3 and previously 3.1 on 08/20/2016.   I have sent in a refill for her Klor-Con but I have instructed patient to hold off on taking this medication until she speaks with her PCP on Monday 9/18 for further instruction about dosing or stopping the supplementation.  She expressed understanding and states she will call Dr. Gerlean Ren on Monday.  [name], DO 09/04/2016, 1:43 PM

## 2016-09-08 ENCOUNTER — Telehealth: Payer: Self-pay | Admitting: *Deleted

## 2016-09-08 ENCOUNTER — Other Ambulatory Visit: Payer: Self-pay | Admitting: *Deleted
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
## 2016-09-09 ENCOUNTER — Other Ambulatory Visit: Payer: Self-pay | Admitting: *Deleted

## 2016-09-09 DIAGNOSIS — M792 Neuralgia and neuritis, unspecified: Secondary | ICD-10-CM

## 2016-09-09 DIAGNOSIS — G8929 Other chronic pain: Secondary | ICD-10-CM

## 2016-09-09 DIAGNOSIS — I10 Essential (primary) hypertension: Secondary | ICD-10-CM

## 2016-09-09 DIAGNOSIS — Z853 Personal history of malignant neoplasm of breast: Secondary | ICD-10-CM

## 2016-09-09 MED ORDER — METHADONE HCL 5 MG PO TABS: 15.0000 mg | ORAL_TABLET | Freq: Three times a day (TID) | ORAL | 0 refills | Status: DC | PRN

## 2016-09-10 ENCOUNTER — Telehealth: Payer: Self-pay | Admitting: *Deleted

## 2016-09-10 MED FILL — METHADONE HCL 5 MG TABLET: 5 | 20 days supply | Qty: 180 | Fill #0

## 2016-09-10 NOTE — Telephone Encounter (Signed)
Patient called stating that her methadone prescription that was recently filled was for 180 tablets and it should be 270. Patient states she did get the 180 filled and she can come on Monday to get the rest.

## 2016-09-13 ENCOUNTER — Other Ambulatory Visit: Payer: Self-pay | Admitting: *Deleted

## 2016-09-13 NOTE — Telephone Encounter (Signed)
Val, she can see Retta Mac PRN

## 2016-09-20 ENCOUNTER — Other Ambulatory Visit: Payer: Self-pay | Admitting: Family Medicine

## 2016-09-21 ENCOUNTER — Telehealth: Payer: Self-pay | Admitting: *Deleted

## 2016-09-21 NOTE — Telephone Encounter (Signed)
"  I need to pick up a prescription for refill on Methadone and the cream I use that begins with a "C" for the rash on my bottom.  I need to pick these up next Tuesday 09-28-2016."  Will notify Dr. [name].

## 2016-09-27 ENCOUNTER — Telehealth: Payer: Self-pay | Admitting: *Deleted

## 2016-09-27 ENCOUNTER — Other Ambulatory Visit: Payer: Self-pay | Admitting: *Deleted

## 2016-09-27 DIAGNOSIS — M792 Neuralgia and neuritis, unspecified: Secondary | ICD-10-CM

## 2016-09-27 DIAGNOSIS — Z853 Personal history of malignant neoplasm of breast: Secondary | ICD-10-CM

## 2016-09-27 DIAGNOSIS — I10 Essential (primary) hypertension: Secondary | ICD-10-CM

## 2016-09-27 DIAGNOSIS — G8929 Other chronic pain: Secondary | ICD-10-CM

## 2016-09-27 MED ORDER — METHADONE HCL 5 MG PO TABS: 15.0000 mg | ORAL_TABLET | Freq: Three times a day (TID) | ORAL | 0 refills | Status: DC | PRN

## 2016-09-27 NOTE — Telephone Encounter (Signed)
"  I talked with you last week about my prescription I need today.  Is my prescription is ready for pick up.  I talked with them and was told it was changed to tomorrow.  Tell them I will come in tomorrow and to have my prescription ready tomorrow."  Asked if she spoke with the nurse or doctor today.  "I've been on this four years, I don't need to talk with them."

## 2016-09-27 NOTE — Telephone Encounter (Signed)
Prescription printed - awaiting sig from MD.

## 2016-09-27 NOTE — Telephone Encounter (Signed)
Pt. Called in regards to her Methodone refill. Pt. Stated that she's already spoken with the nurse. Call was transferred to vmail

## 2016-09-29 MED FILL — METHADONE HCL 5 MG TABLET: 5 | 30 days supply | Qty: 270 | Fill #0

## 2016-10-11 ENCOUNTER — Telehealth: Payer: Self-pay | Admitting: Gastroenterology

## 2016-10-11 NOTE — Telephone Encounter (Signed)
Returned pt's call.  Pt confirmed that she has never had a colonoscopy

## 2016-10-25 ENCOUNTER — Other Ambulatory Visit: Payer: Self-pay

## 2016-10-25 ENCOUNTER — Telehealth: Payer: Self-pay | Admitting: *Deleted

## 2016-10-25 DIAGNOSIS — I10 Essential (primary) hypertension: Secondary | ICD-10-CM

## 2016-10-25 DIAGNOSIS — Z853 Personal history of malignant neoplasm of breast: Secondary | ICD-10-CM

## 2016-10-25 DIAGNOSIS — M792 Neuralgia and neuritis, unspecified: Secondary | ICD-10-CM

## 2016-10-25 DIAGNOSIS — G8929 Other chronic pain: Secondary | ICD-10-CM

## 2016-10-25 MED ORDER — METHADONE HCL 5 MG PO TABS: 15.0000 mg | ORAL_TABLET | Freq: Three times a day (TID) | ORAL | 0 refills | Status: DC | PRN

## 2016-10-25 NOTE — Telephone Encounter (Signed)
'  Please tell the nurse I need my Methadone refill.  I will pick up the prescription on Wednesday.  [phone]."

## 2016-10-27 ENCOUNTER — Encounter: Payer: Medicaid Other | Admitting: Gastroenterology

## 2016-10-27 MED FILL — METHADONE HCL 5 MG TABLET: 5 | 30 days supply | Qty: 270 | Fill #0

## 2016-11-12 ENCOUNTER — Other Ambulatory Visit: Payer: Self-pay | Admitting: Family Medicine

## 2016-11-19 ENCOUNTER — Other Ambulatory Visit: Payer: Self-pay | Admitting: *Deleted

## 2016-11-22 ENCOUNTER — Telehealth: Payer: Self-pay | Admitting: *Deleted

## 2016-11-22 NOTE — Telephone Encounter (Signed)
"  I am due for the Methadone 5 mg number 270 pills on Wednesday.  Will you also let Val know I need Calmoseptin cream as well.  Will pick up prescription on Wednesday."

## 2016-11-24 ENCOUNTER — Other Ambulatory Visit: Payer: Self-pay | Admitting: *Deleted

## 2016-11-24 ENCOUNTER — Ambulatory Visit: Payer: Medicaid Other | Admitting: Family Medicine

## 2016-11-24 DIAGNOSIS — Z853 Personal history of malignant neoplasm of breast: Secondary | ICD-10-CM

## 2016-11-24 DIAGNOSIS — G8929 Other chronic pain: Secondary | ICD-10-CM

## 2016-11-24 DIAGNOSIS — I10 Essential (primary) hypertension: Secondary | ICD-10-CM

## 2016-11-24 DIAGNOSIS — M792 Neuralgia and neuritis, unspecified: Secondary | ICD-10-CM

## 2016-11-24 MED ORDER — METHADONE HCL 5 MG PO TABS: 15.0000 mg | ORAL_TABLET | Freq: Three times a day (TID) | ORAL | 0 refills | Status: DC | PRN

## 2016-11-24 MED FILL — METHADONE HCL 5 MG TABLET: 5 | 30 days supply | Qty: 270 | Fill #0

## 2016-11-24 NOTE — Telephone Encounter (Signed)
States she is on her way for rx

## 2016-12-03 IMAGING — MR MR HEAD WO/W CM
9 of 13 series · 35 of 48 positions shown · IV contrast (20    MULTIHANCE)
Comparison: 11/12/2015.

CLINICAL DATA: History of breast cancer with intractable headaches.

EXAM:
MRI HEAD WITHOUT AND WITH CONTRAST
TECHNIQUE: Multiplanar, multiecho pulse sequences of the brain and surrounding
structures were obtained without and with intravenous contrast.
CONTRAST:  20mL MULTIHANCE GADOBENATE DIMEGLUMINE 529 MG/ML IV SOLN

[Series 5: DWI · axial · 3.0mm · 1.09mm/px · z∈[-87,+60]mm · 9 of 100 slices shown (1 of 4)]
[im 1/100]
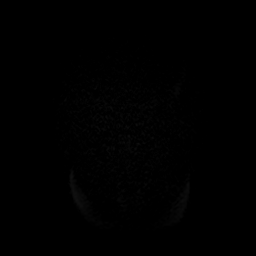
[im 13/100]
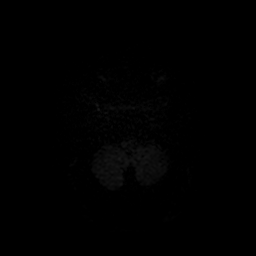
[im 25/100]
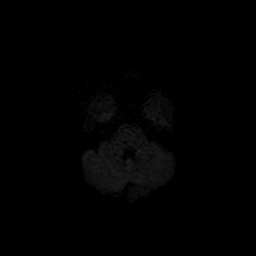
[im 38/100]
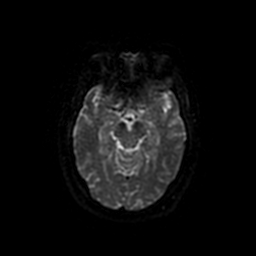
[im 50/100]
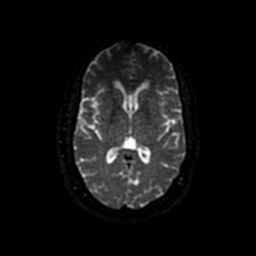
[im 62/100]
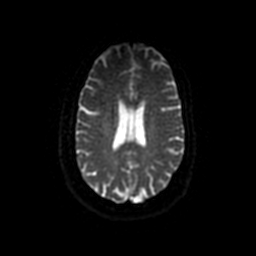
[im 75/100]
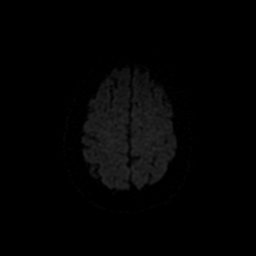
[im 87/100]
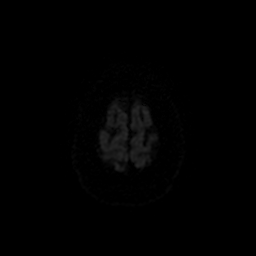
[im 100/100]
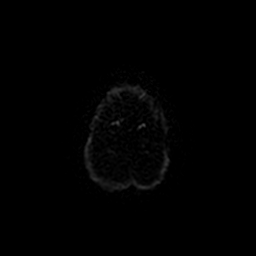

[Series 6: DWI · coronal · 5.0mm · 1.09mm/px · 7 of 80 slices shown (2 of 4)]
[im 1/80]
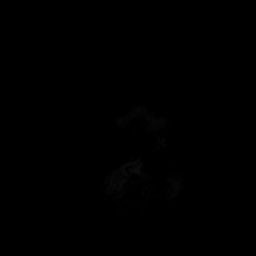
[im 14/80]
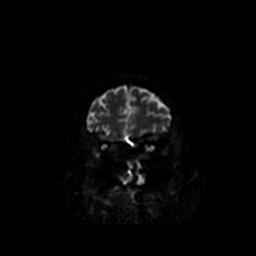
[im 27/80]
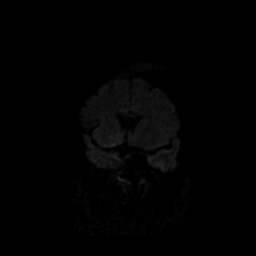
[im 40/80]
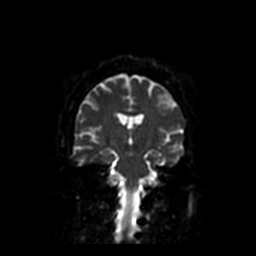
[im 53/80]
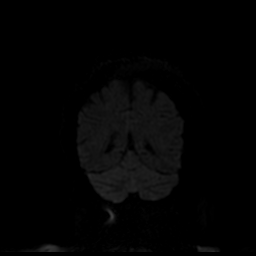
[im 66/80]
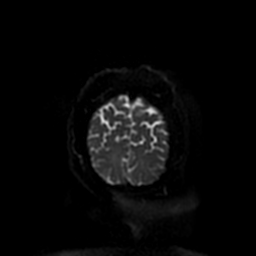
[im 80/80]
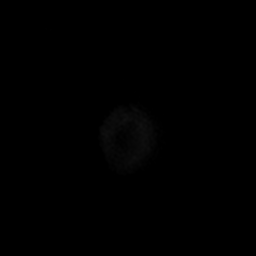

[Series 7: T2 · axial · 5.0mm · 0.43mm/px · z∈[-86,+64]mm · 2 of 24 slices shown]
[im 1/24]
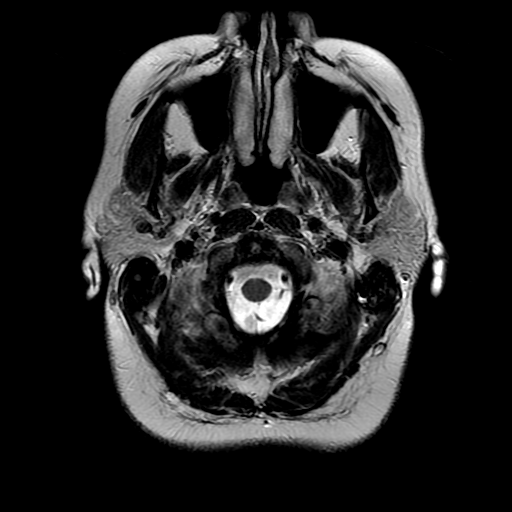
[im 24/24]
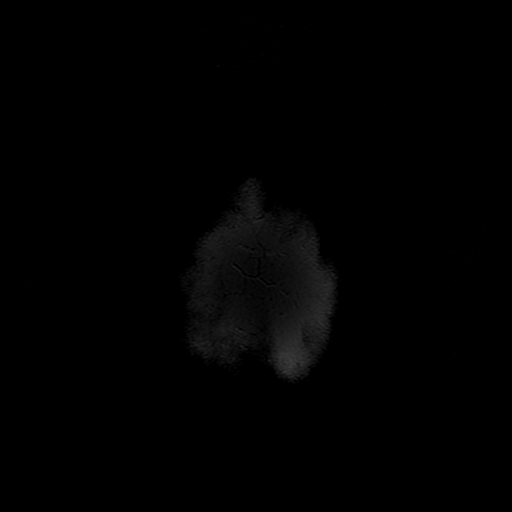

[Series 10: FLAIR · axial · 5.0mm · 0.43mm/px · z∈[-92,+69]mm · 2 of 24 slices shown]
[im 1/24]
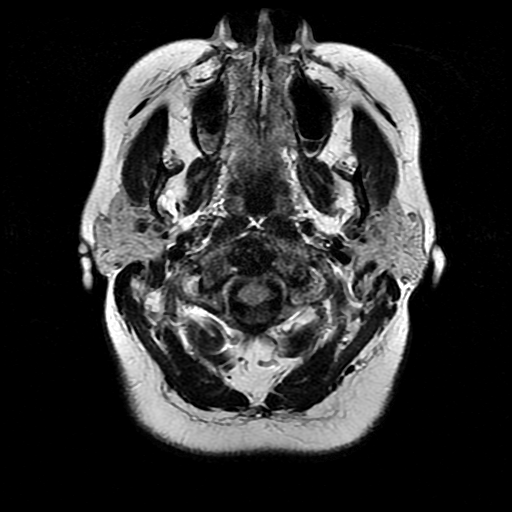
[im 24/24]
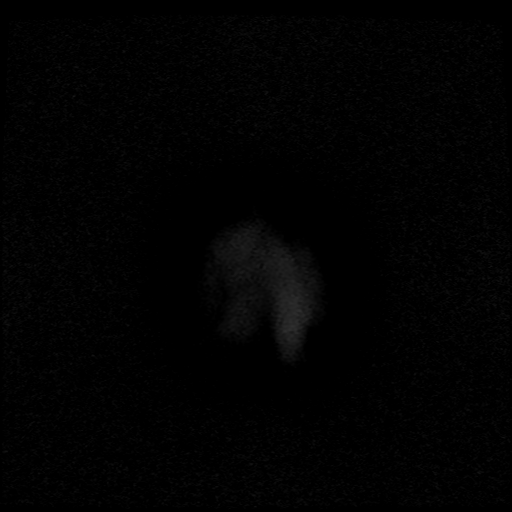

[Series 12: T2 post-contrast · coronal · 5.0mm · 0.45mm/px · 3 of 30 slices shown]
[im 1/30]
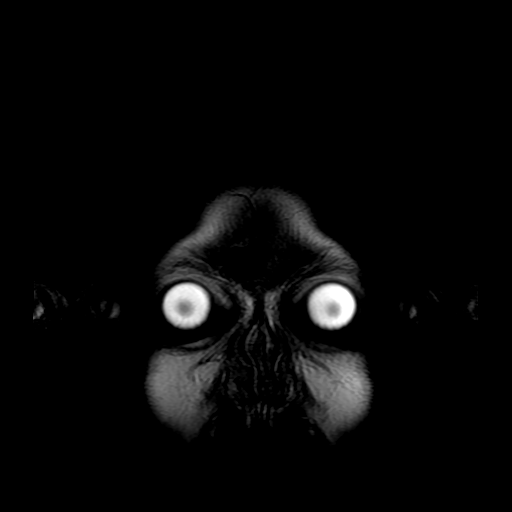
[im 15/30]
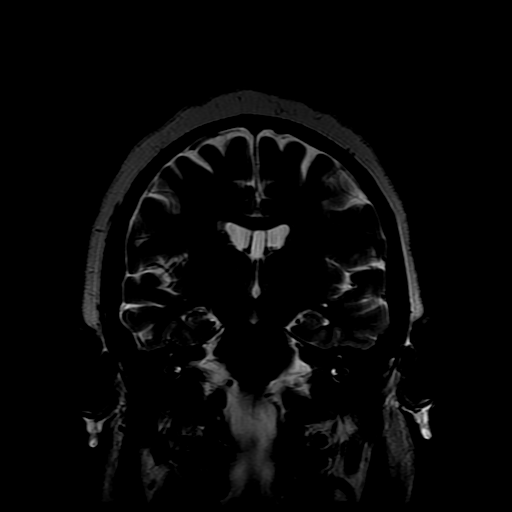
[im 30/30]
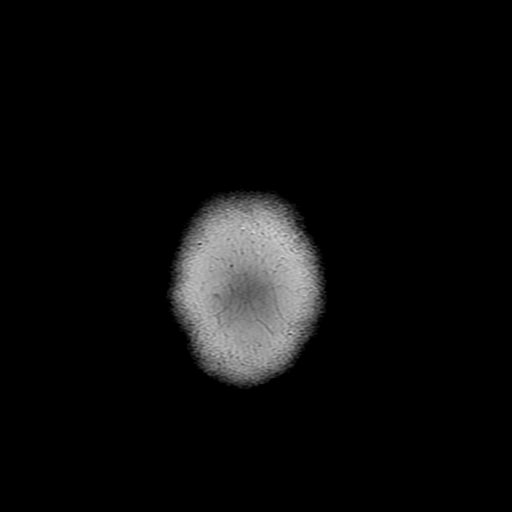

[Series 14: T1 post-contrast · coronal · 5.0mm · 0.45mm/px · 3 of 30 slices shown (1 of 2)]
[im 1/30]
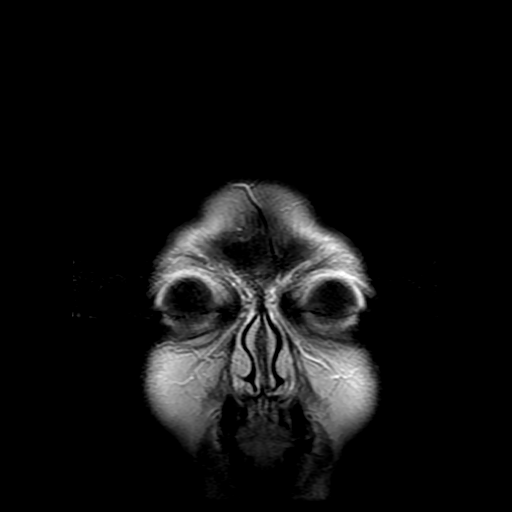
[im 15/30]
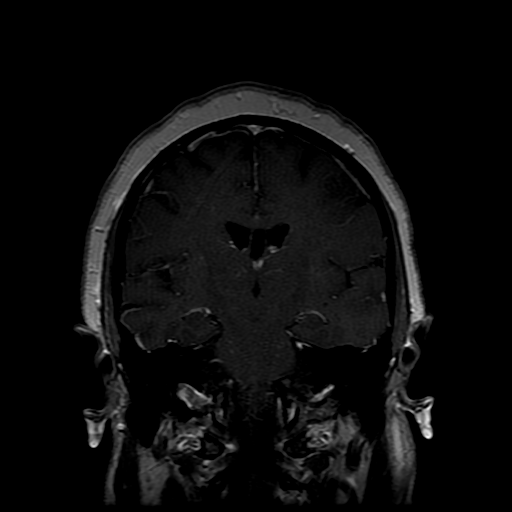
[im 30/30]
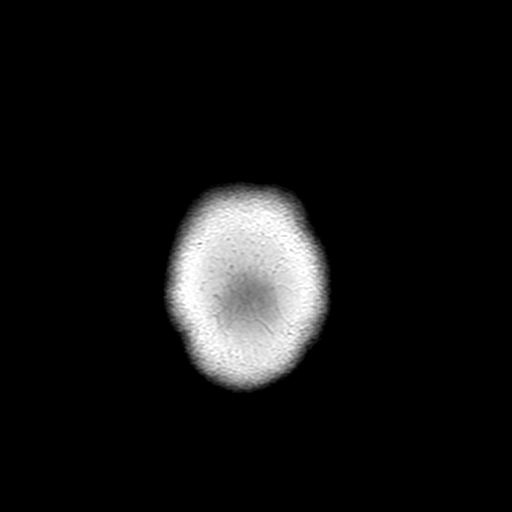

[Series 15: T1 post-contrast · sagittal · 5.0mm · 0.47mm/px · 2 of 24 slices shown (2 of 2)]
[im 1/24]
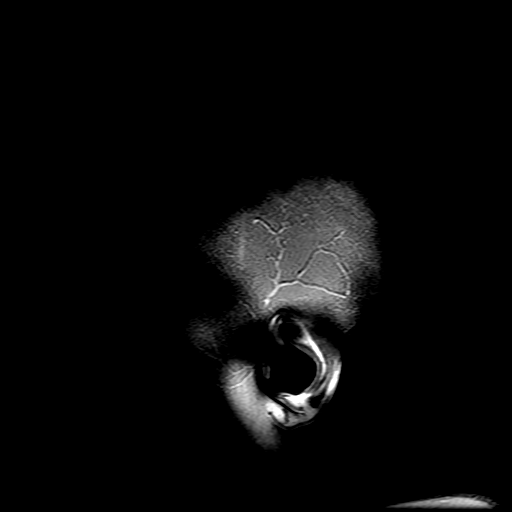
[im 24/24]
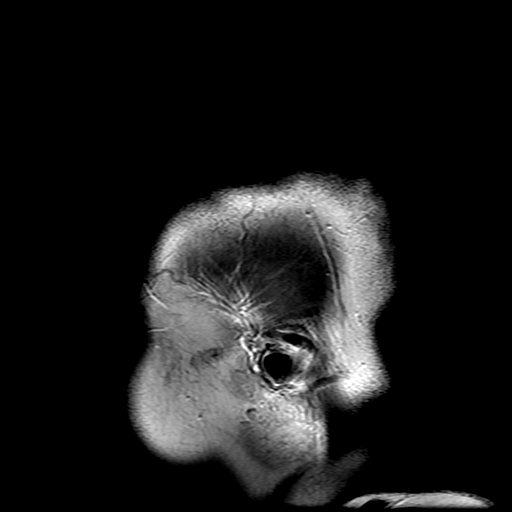

[Series 500: DWI · axial · 3.0mm · 1.09mm/px · z∈[-87,+60]mm · 4 of 50 slices shown (3 of 4)]
[im 1/50]
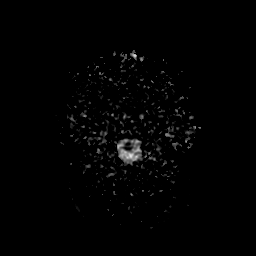
[im 17/50]
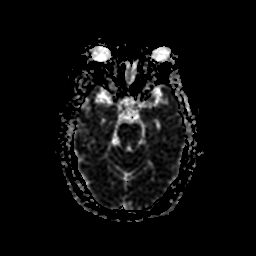
[im 33/50]
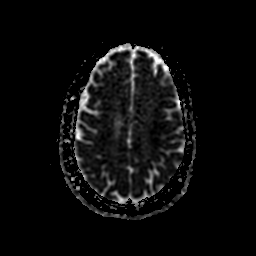
[im 50/50]
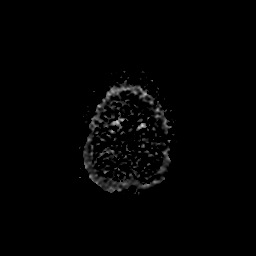

[Series 600: DWI · coronal · 5.0mm · 1.09mm/px · 3 of 40 slices shown (4 of 4)]
[im 1/40]
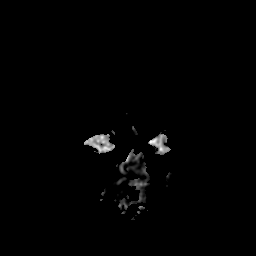
[im 20/40]
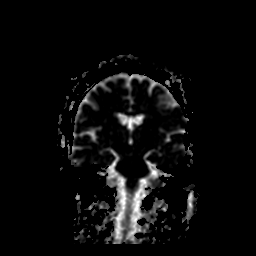
[im 40/40]
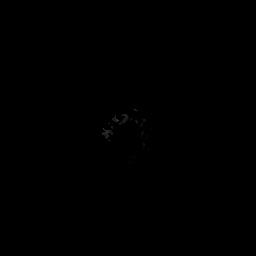

[35 of 48 positions shown; findings below may reference images not displayed]

FINDINGS: Premature for age atrophy. Mild white matter changes, either post
treatment effect or chronic microvascular ischemic change, frontal
predominant, stable.

No acute stroke, acute hemorrhage, mass lesion, hydrocephalus, or
extra-axial fluid. Post infusion, no abnormal enhancement of the
brain or meninges.

Incidental cavum septum pellucidum and vergae.

Extracranial soft tissues unremarkable.  No osseous findings.
IMPRESSION: Stable chronic findings.  No acute intracranial abnormality.

No evidence for intracranial metastatic disease.

## 2016-12-08 ENCOUNTER — Encounter: Payer: Medicaid Other | Admitting: Gastroenterology

## 2016-12-09 ENCOUNTER — Other Ambulatory Visit: Payer: Self-pay | Admitting: Family Medicine

## 2016-12-09 DIAGNOSIS — G8929 Other chronic pain: Secondary | ICD-10-CM

## 2016-12-09 DIAGNOSIS — M792 Neuralgia and neuritis, unspecified: Secondary | ICD-10-CM

## 2016-12-09 DIAGNOSIS — Z853 Personal history of malignant neoplasm of breast: Secondary | ICD-10-CM

## 2016-12-09 DIAGNOSIS — I10 Essential (primary) hypertension: Secondary | ICD-10-CM

## 2016-12-10 ENCOUNTER — Ambulatory Visit: Payer: Medicaid Other | Admitting: Family Medicine

## 2016-12-21 ENCOUNTER — Other Ambulatory Visit: Payer: Self-pay | Admitting: *Deleted

## 2016-12-21 DIAGNOSIS — G8929 Other chronic pain: Secondary | ICD-10-CM

## 2016-12-21 DIAGNOSIS — I10 Essential (primary) hypertension: Secondary | ICD-10-CM

## 2016-12-21 DIAGNOSIS — Z853 Personal history of malignant neoplasm of breast: Secondary | ICD-10-CM

## 2016-12-21 DIAGNOSIS — M792 Neuralgia and neuritis, unspecified: Secondary | ICD-10-CM

## 2016-12-21 MED ORDER — METHADONE HCL 5 MG PO TABS: 15.0000 mg | ORAL_TABLET | Freq: Three times a day (TID) | ORAL | 0 refills | Status: DC | PRN

## 2016-12-22 MED FILL — METHADONE HCL 5 MG TABLET: 5 | 30 days supply | Qty: 270 | Fill #0

## 2016-12-24 ENCOUNTER — Ambulatory Visit: Payer: Medicaid Other | Admitting: Family Medicine

## 2016-12-24 DIAGNOSIS — M1711 Unilateral primary osteoarthritis, right knee: Secondary | ICD-10-CM | POA: Diagnosis not present

## 2016-12-24 DIAGNOSIS — M25461 Effusion, right knee: Secondary | ICD-10-CM | POA: Diagnosis not present

## 2016-12-24 DIAGNOSIS — G8929 Other chronic pain: Secondary | ICD-10-CM | POA: Diagnosis not present

## 2016-12-24 DIAGNOSIS — M25561 Pain in right knee: Secondary | ICD-10-CM | POA: Diagnosis not present

## 2016-12-31 ENCOUNTER — Ambulatory Visit: Payer: Medicaid Other | Admitting: Family Medicine

## 2017-01-04 ENCOUNTER — Ambulatory Visit: Payer: Medicaid Other | Admitting: Family Medicine

## 2017-01-07 ENCOUNTER — Ambulatory Visit: Payer: Medicaid Other | Admitting: Family Medicine

## 2017-01-09 ENCOUNTER — Other Ambulatory Visit: Payer: Self-pay | Admitting: Family Medicine

## 2017-01-10 ENCOUNTER — Other Ambulatory Visit: Payer: Self-pay | Admitting: Oncology

## 2017-01-10 DIAGNOSIS — C50412 Malignant neoplasm of upper-outer quadrant of left female breast: Secondary | ICD-10-CM

## 2017-01-10 NOTE — Progress Notes (Unsigned)
Refer phys

## 2017-01-11 ENCOUNTER — Telehealth: Payer: Self-pay

## 2017-01-11 NOTE — Telephone Encounter (Signed)
Pt called to let us know she will be able to pick up her methadone prescription on Friday 2/2. Please have it ready.

## 2017-01-12 ENCOUNTER — Ambulatory Visit: Payer: No Typology Code available for payment source | Admitting: Physical Therapy

## 2017-01-14 ENCOUNTER — Other Ambulatory Visit: Payer: Self-pay | Admitting: Family Medicine

## 2017-01-17 ENCOUNTER — Other Ambulatory Visit: Payer: Self-pay | Admitting: *Deleted

## 2017-01-17 ENCOUNTER — Telehealth: Payer: Self-pay

## 2017-01-17 ENCOUNTER — Telehealth: Payer: Self-pay | Admitting: *Deleted

## 2017-01-17 DIAGNOSIS — M792 Neuralgia and neuritis, unspecified: Secondary | ICD-10-CM

## 2017-01-17 DIAGNOSIS — Z853 Personal history of malignant neoplasm of breast: Secondary | ICD-10-CM

## 2017-01-17 DIAGNOSIS — G8929 Other chronic pain: Secondary | ICD-10-CM

## 2017-01-17 DIAGNOSIS — I10 Essential (primary) hypertension: Secondary | ICD-10-CM

## 2017-01-17 MED ORDER — METHADONE HCL 5 MG PO TABS: 15.0000 mg | ORAL_TABLET | Freq: Three times a day (TID) | ORAL | 0 refills | Status: DC | PRN

## 2017-01-17 NOTE — Telephone Encounter (Signed)
"  I need an order faxed to [facility] for four new sleeves and gloves.  Fax order to [phone].  Is the Methadone prescription ready.  I asked for this to be ready on Friday but I'll be in the area tomorrow and will pick it up tomorrow."  Will notify staff of these requests.

## 2017-01-18 ENCOUNTER — Ambulatory Visit: Payer: Medicaid Other | Admitting: Family Medicine

## 2017-01-19 MED FILL — METHADONE HCL 5 MG TABLET: 5 | 30 days supply | Qty: 270 | Fill #0

## 2017-01-19 NOTE — Telephone Encounter (Signed)
Error

## 2017-01-21 ENCOUNTER — Ambulatory Visit: Payer: Medicaid Other | Admitting: Family Medicine

## 2017-01-24 ENCOUNTER — Encounter: Payer: Self-pay | Admitting: Family Medicine

## 2017-01-24 ENCOUNTER — Ambulatory Visit (INDEPENDENT_AMBULATORY_CARE_PROVIDER_SITE_OTHER): Payer: Medicaid Other | Admitting: Family Medicine

## 2017-01-24 VITALS — BP 126/88 | HR 76 | Temp 97.7°F | Ht 65.0 in | Wt 216.6 lb

## 2017-01-24 DIAGNOSIS — L853 Xerosis cutis: Secondary | ICD-10-CM

## 2017-01-24 DIAGNOSIS — Z23 Encounter for immunization: Secondary | ICD-10-CM

## 2017-01-24 LAB — POCT SKIN KOH: SKIN KOH, POC: NEGATIVE

## 2017-01-24 MED ORDER — HYDROXYZINE HCL 25 MG PO TABS: 25.0000 mg | ORAL_TABLET | Freq: Three times a day (TID) | ORAL | 0 refills | Status: DC | PRN

## 2017-01-24 MED ORDER — EUCERIN EX CREA: TOPICAL_CREAM | CUTANEOUS | 0 refills | Status: DC | PRN

## 2017-01-24 NOTE — Patient Instructions (Addendum)
Thank you so much for coming to visit today! The test came back with NO fungus present. The rash does not look infectious, but instead looks like dry skin. Given the itching and onset after a new medication, I will prescribe Hydroxyzine to take up to three times a day for itching. This medication may make you sleepy, so don't drive until you know how it will affect you.  I recommend a thick ointment, such as Vaseline or Eucerin Ointment, to help with the dryness and irritation. A prescription for Eucerin has been sent to your pharmacy. If no improvement over the next two weeks, please let me know.  Please return for a general exam. Please do not cancel same day and avoid no shows if possible--we cannot allow you to keep doing this.  Dr. Gerlean Ren

## 2017-01-25 ENCOUNTER — Ambulatory Visit: Payer: Medicaid Other | Attending: Oncology | Admitting: Physical Therapy

## 2017-01-25 ENCOUNTER — Ambulatory Visit: Payer: No Typology Code available for payment source | Admitting: Physical Therapy

## 2017-01-25 DIAGNOSIS — R293 Abnormal posture: Secondary | ICD-10-CM | POA: Insufficient documentation

## 2017-01-25 DIAGNOSIS — M6281 Muscle weakness (generalized): Secondary | ICD-10-CM | POA: Diagnosis present

## 2017-01-25 DIAGNOSIS — I972 Postmastectomy lymphedema syndrome: Secondary | ICD-10-CM | POA: Insufficient documentation
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
## 2017-01-25 NOTE — Progress Notes (Signed)
Subjective:     Patient ID: [name], female   DOB: June 19, 1961, 56 y.o.   MRN: [id number]  HPI Mrs. Stradling is a 56yo female presenting today for rash on face. Notes her face is dry and seems swollen to her for three days. Denies any new lotions, soaps, shampoos. Did try a new Hair Skin and Nails Vitamin last week, before symptoms started. Has not taken any more since symptoms started. Denies any other recent medication changes. Denies history of prior. Denies any increased exposure to cold. Has been using coco lotion without relief. Itches--has been using Benadryl with some improvement. Continues to smoke.  Also of note, has had 7 no shows or same day cancellations since December 2017. States she knows this is unacceptable behavior and plans to work on this. Remembers this behavior was also discussed at last office visit.  Review of Systems Per HPI.    Objective:   Physical Exam  Constitutional: She appears well-developed and well-nourished. No distress.  Cardiovascular: Normal rate and regular rhythm.   No murmur heard. Pulmonary/Chest: Effort normal. No respiratory distress. She has no wheezes.  Lymphadenopathy:    She has no cervical adenopathy.  Skin:  Dry skin noted on face, no erythema or warmth, no other rash present, no edema  Psychiatric: She has a normal mood and affect. Her behavior is normal.      Assessment and Plan:     1. Dry skin KOH obtained given reports of itching--negative. Hydroxyzine prescribed for itching. Recommend Eucerin and/or Vaseline for dryness. Avoid Hair Skin and Nails vitamin she took prior to onset of symptoms. Return if no improvement. Consider mild topical steroid if no improvement.  Frequent no show/same day cancellation. Patient being submitted to [facility] committee to be reviewed and future no shows/cancellations will be closely monitored.

## 2017-01-26 ENCOUNTER — Ambulatory Visit (INDEPENDENT_AMBULATORY_CARE_PROVIDER_SITE_OTHER): Payer: Medicaid Other | Admitting: Family Medicine

## 2017-01-26 VITALS — BP 135/90 | HR 71 | Temp 97.7°F | Ht 65.0 in | Wt 216.6 lb

## 2017-01-26 DIAGNOSIS — R601 Generalized edema: Secondary | ICD-10-CM

## 2017-01-26 LAB — POCT URINALYSIS DIPSTICK
Bilirubin, UA: NEGATIVE
Blood, UA: NEGATIVE
Glucose, UA: NEGATIVE
Ketones, UA: NEGATIVE
LEUKOCYTES UA: NEGATIVE
NITRITE UA: NEGATIVE
PROTEIN UA: NEGATIVE
SPEC GRAV UA: 1.02
UROBILINOGEN UA: 0.2
pH, UA: 6

## 2017-01-26 LAB — COMPLETE METABOLIC PANEL WITH GFR
ALT: 13 U/L (ref 6–29)
AST: 19 U/L (ref 10–35)
Albumin: 3.9 g/dL (ref 3.6–5.1)
Alkaline Phosphatase: 76 U/L (ref 33–130)
BUN: 15 mg/dL (ref 7–25)
CHLORIDE: 106 mmol/L (ref 98–110)
CO2: 26 mmol/L (ref 20–31)
CREATININE: 1.16 mg/dL — AB (ref 0.50–1.05)
Calcium: 9.5 mg/dL (ref 8.6–10.4)
GFR, EST AFRICAN AMERICAN: 62 mL/min (ref >=60)
GFR, Est Non African American: 54 mL/min — ABNORMAL LOW (ref >=60)
GLUCOSE: 85 mg/dL (ref 65–99)
Potassium: 4.1 mmol/L (ref 3.5–5.3)
SODIUM: 142 mmol/L (ref 135–146)
Total Bilirubin: 0.3 mg/dL (ref 0.2–1.2)
Total Protein: 6.8 g/dL (ref 6.1–8.1)

## 2017-01-26 LAB — TSH: TSH: 1.24 mIU/L

## 2017-01-26 NOTE — Patient Instructions (Signed)
Thank you so much for coming to visit today! We will obtain some lab work to evaluate your fatigue and swelling. I will mail you a letter with the results. Please follow up with your Orthopedic physician to discuss your knee pain.  Dr. Gerlean Ren

## 2017-01-27 ENCOUNTER — Other Ambulatory Visit: Payer: Self-pay | Admitting: Sports Medicine

## 2017-01-27 DIAGNOSIS — M1711 Unilateral primary osteoarthritis, right knee: Secondary | ICD-10-CM

## 2017-01-27 LAB — BRAIN NATRIURETIC PEPTIDE: BRAIN NATRIURETIC PEPTIDE: 15.5 pg/mL (ref <100)

## 2017-01-31 NOTE — Progress Notes (Signed)
Subjective:     Patient ID: [name], female   DOB: 23-May-1961, 56 y.o.   MRN: [id number]  HPI [name] is a 56yo female presenting today for generalized swelling and fatigue. These issues have been chronic, however she feels they are worsening. Notes baseline edema of left arm due to history of mastectomy; usually controlled with sleeve which she is not wearing today. Swelling occurs in bilateral arms and legs periodically, not present today. Denies worsening shortness of breath, orthopnea, paroxysmal nocturnal dyspnea, chest pain. Also notes history of right knee pain and swelling, which she follows with orthopedics for. Reports she has an MRI scheduled later today--note she also reported at her last office visit on 2/5 that an MRI was scheduled on her right knee later that day. Smoker.   Review of Systems Per HPI    Objective:   Physical Exam  Constitutional: She appears well-developed and well-nourished. No distress.  Neck: No thyromegaly present.  Cardiovascular: Normal rate and regular rhythm.   No murmur heard. Pulmonary/Chest: Effort normal. No respiratory distress. She has no wheezes.  Musculoskeletal:  Left upper extremity edema consistent with prior exams. No lower extremity edema noted. Brace present on right knee.  Psychiatric: She has a normal mood and affect. Her behavior is normal.      Assessment and Plan:     1. Generalized edema and fatigue Will check Urinalysis, BNP, CMP, and TSH. Suspect some component of somatization as she often presents with vague symptoms and requests workup. Follow up with Orthopedics for right knee pain. Follow up as needed.

## 2017-02-01 ENCOUNTER — Telehealth: Payer: Self-pay

## 2017-02-01 NOTE — Telephone Encounter (Signed)
Would like to have Vitamin D refilled. Would like Dr. Gerlean Ren to look at blood work and go over that on her appt on 2/26. Ottis Stain, CMA

## 2017-02-04 ENCOUNTER — Ambulatory Visit
Admission: RE | Admit: 2017-02-04 | Discharge: 2017-02-04 | Disposition: A | Discharge status: Home / Self Care | Payer: Medicaid Other | Source: Ambulatory Visit | Attending: Sports Medicine | Admitting: Sports Medicine

## 2017-02-04 DIAGNOSIS — M1711 Unilateral primary osteoarthritis, right knee: Secondary | ICD-10-CM

## 2017-02-07 ENCOUNTER — Telehealth: Payer: Self-pay | Admitting: *Deleted

## 2017-02-07 NOTE — Telephone Encounter (Signed)
Patient called stating that she needs a refill for her methadone by the 02/16/17 (She will pick up) and a special cream (calmosettine)

## 2017-02-09 ENCOUNTER — Encounter: Payer: Medicaid Other | Admitting: Gastroenterology

## 2017-02-11 ENCOUNTER — Ambulatory Visit: Payer: Medicaid Other | Admitting: Physical Therapy

## 2017-02-11 ENCOUNTER — Other Ambulatory Visit: Payer: Self-pay | Admitting: *Deleted

## 2017-02-11 DIAGNOSIS — M792 Neuralgia and neuritis, unspecified: Secondary | ICD-10-CM

## 2017-02-11 DIAGNOSIS — I10 Essential (primary) hypertension: Secondary | ICD-10-CM

## 2017-02-11 DIAGNOSIS — Z853 Personal history of malignant neoplasm of breast: Secondary | ICD-10-CM

## 2017-02-11 DIAGNOSIS — G8929 Other chronic pain: Secondary | ICD-10-CM

## 2017-02-11 MED ORDER — METHADONE HCL 5 MG PO TABS: 15.0000 mg | ORAL_TABLET | Freq: Three times a day (TID) | ORAL | 0 refills | Status: DC | PRN

## 2017-02-14 ENCOUNTER — Ambulatory Visit: Payer: Medicaid Other | Admitting: Family Medicine

## 2017-02-24 ENCOUNTER — Telehealth: Payer: Self-pay | Admitting: Family Medicine

## 2017-02-24 ENCOUNTER — Ambulatory Visit: Payer: Medicaid Other | Admitting: Family Medicine

## 2017-02-24 NOTE — Telephone Encounter (Signed)
Pt would like someone to call her about her labs, pt just wants to know if everything is ok. ep

## 2017-02-25 ENCOUNTER — Ambulatory Visit: Payer: Medicaid Other | Attending: Oncology | Admitting: Physical Therapy

## 2017-02-25 DIAGNOSIS — R293 Abnormal posture: Secondary | ICD-10-CM | POA: Diagnosis present

## 2017-02-25 DIAGNOSIS — I972 Postmastectomy lymphedema syndrome: Secondary | ICD-10-CM | POA: Diagnosis present
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
## 2017-02-26 ENCOUNTER — Other Ambulatory Visit: Payer: Self-pay | Admitting: Family Medicine

## 2017-03-01 ENCOUNTER — Ambulatory Visit: Payer: Medicaid Other | Admitting: Family Medicine

## 2017-03-04 ENCOUNTER — Encounter: Payer: Self-pay | Admitting: Family Medicine

## 2017-03-04 NOTE — Telephone Encounter (Signed)
Labs were fine. Did not contact her to notify results initially since patient wanted to come in person and discuss her results--patient had multiple appointments with same day cancellations for this.

## 2017-03-04 NOTE — Telephone Encounter (Signed)
Contacted pt to give her below message and she said that she has appointment on Monday to discuss this. [name], [name], [place]

## 2017-03-07 ENCOUNTER — Ambulatory Visit (INDEPENDENT_AMBULATORY_CARE_PROVIDER_SITE_OTHER): Payer: Medicaid Other | Admitting: Family Medicine

## 2017-03-07 ENCOUNTER — Encounter: Payer: Self-pay | Admitting: Family Medicine

## 2017-03-07 VITALS — BP 150/98 | HR 66 | Temp 98.1°F | Ht 65.0 in | Wt 218.2 lb

## 2017-03-07 DIAGNOSIS — Z91199 Patient's noncompliance with other medical treatment and regimen due to unspecified reason: Secondary | ICD-10-CM | POA: Insufficient documentation

## 2017-03-07 DIAGNOSIS — Z5329 Procedure and treatment not carried out because of patient's decision for other reasons: Secondary | ICD-10-CM | POA: Insufficient documentation

## 2017-03-07 DIAGNOSIS — J0100 Acute maxillary sinusitis, unspecified: Secondary | ICD-10-CM | POA: Diagnosis present

## 2017-03-07 DIAGNOSIS — M6530 Trigger finger, unspecified finger: Secondary | ICD-10-CM

## 2017-03-07 MED ORDER — ATORVASTATIN CALCIUM 40 MG PO TABS: 40.0000 mg | ORAL_TABLET | Freq: Every day | ORAL | 3 refills | Status: DC

## 2017-03-07 NOTE — Patient Instructions (Signed)
Thank you so much for coming to visit today! You have trigger finger. We will show you how to buddy tape your fingers today. Please follow up with orthopedics for possible injection. I have refilled your statin. If you are still having symptoms of facial pain and congestion Monday, please let me know. Please return in 77months.  Dr. Gerlean Ren

## 2017-03-08 NOTE — Progress Notes (Signed)
Subjective:     Patient ID: [name], female   DOB: 05-Nov-1961, 56 y.o.   MRN: [id number]  HPI Mrs. Dimascio is a 56yo female presenting today for sinus problems and trigger finger.  # Sinus Problems: Notes increased congestion and right facial tenderness for the last several days, symptoms starting on 3/15. Requests antibiotics. Denies fever. Denies cough. Denies shortness of breath or wheezing. Has not tried any medication so far.  # Trigger Finger: Notes fingers popping and getting stuck for the last several months. Initially involved only her 4th digit on her right hand, now notes similar symptoms in 3rd digit. Notes popping is painful. Notes improvement with ibuprofen. Follows with orthopedics with appointment in a few weeks.  Review of Systems Per HPI    Objective:   Physical Exam  Constitutional: She appears well-developed and well-nourished. No distress.  HENT:  Head: Normocephalic and atraumatic.  Mouth/Throat: Oropharynx is clear and moist.  Left and right tympanic membrane normal with normal aeration of middle ear bilaterally. Tenderness over right maxillary sinus. Frontal and maxillary sinuses symmetric to illumination.  Cardiovascular: Normal rate and regular rhythm.   No murmur heard. Pulmonary/Chest: Effort normal. No respiratory distress. She has no wheezes.  Musculoskeletal: She exhibits no edema.  Popping of 3rd and 4th digit of right hand noted with flexion of digits  Skin: No rash noted.  Psychiatric: She has a normal mood and affect. Her behavior is normal.      Assessment and Plan:     1. Acute non-recurrent maxillary sinusitis Antibiotics not recommended. Discussed if symptoms present at 10 days, may consider treatment with antibiotics--patient to call on 3/26 and notify office if she is still experiencing facial pain and congestion. Otherwise recommend OTC treatment.  2. Trigger finger, acquired Buddy taping. Continue OTC treatment. Follow up with  Orthopedics for possible injection.

## 2017-03-11 ENCOUNTER — Other Ambulatory Visit: Payer: Self-pay

## 2017-03-11 ENCOUNTER — Telehealth: Payer: Self-pay

## 2017-03-11 DIAGNOSIS — M792 Neuralgia and neuritis, unspecified: Secondary | ICD-10-CM

## 2017-03-11 DIAGNOSIS — I10 Essential (primary) hypertension: Secondary | ICD-10-CM

## 2017-03-11 DIAGNOSIS — Z853 Personal history of malignant neoplasm of breast: Secondary | ICD-10-CM

## 2017-03-11 DIAGNOSIS — G8929 Other chronic pain: Secondary | ICD-10-CM

## 2017-03-11 MED ORDER — METHADONE HCL 5 MG PO TABS: 15.0000 mg | ORAL_TABLET | Freq: Three times a day (TID) | ORAL | 0 refills | Status: DC | PRN

## 2017-03-11 MED FILL — METHADONE HCL 5 MG TABLET: 5 | 30 days supply | Qty: 270 | Fill #0

## 2017-03-11 NOTE — Telephone Encounter (Signed)
error 

## 2017-03-11 NOTE — Progress Notes (Signed)
Pt came in this morning to pick up her methadone prescription refill. Script signed by Dr.Magrinat today and handed to pt, along with calmoseptin ointment per pt request.

## 2017-03-13 ENCOUNTER — Other Ambulatory Visit: Payer: Self-pay | Admitting: Family Medicine

## 2017-03-13 ENCOUNTER — Other Ambulatory Visit: Payer: Self-pay | Admitting: Internal Medicine

## 2017-03-13 DIAGNOSIS — I10 Essential (primary) hypertension: Secondary | ICD-10-CM

## 2017-03-13 DIAGNOSIS — G8929 Other chronic pain: Secondary | ICD-10-CM

## 2017-03-13 DIAGNOSIS — Z853 Personal history of malignant neoplasm of breast: Secondary | ICD-10-CM

## 2017-03-13 DIAGNOSIS — M792 Neuralgia and neuritis, unspecified: Secondary | ICD-10-CM

## 2017-03-14 ENCOUNTER — Other Ambulatory Visit: Payer: Self-pay | Admitting: Family Medicine

## 2017-03-14 NOTE — Telephone Encounter (Signed)
Pt would like this medication filled ASAP, pt was upset. ep

## 2017-03-14 NOTE — Telephone Encounter (Signed)
Pt wants to get this filled ASAP. ep

## 2017-04-04 ENCOUNTER — Encounter (HOSPITAL_COMMUNITY): Payer: Self-pay

## 2017-04-04 ENCOUNTER — Emergency Department (HOSPITAL_COMMUNITY)
Admission: EM | Admit: 2017-04-04 | Discharge: 2017-04-05 | Disposition: A | Discharge status: Home / Self Care | Payer: Medicaid Other | Attending: Emergency Medicine | Admitting: Emergency Medicine

## 2017-04-04 ENCOUNTER — Emergency Department (HOSPITAL_COMMUNITY): Payer: Medicaid Other

## 2017-04-04 DIAGNOSIS — Z955 Presence of coronary angioplasty implant and graft: Secondary | ICD-10-CM | POA: Diagnosis not present

## 2017-04-04 DIAGNOSIS — Y939 Activity, unspecified: Secondary | ICD-10-CM | POA: Diagnosis not present

## 2017-04-04 DIAGNOSIS — W19XXXA Unspecified fall, initial encounter: Secondary | ICD-10-CM | POA: Diagnosis not present

## 2017-04-04 DIAGNOSIS — Y929 Unspecified place or not applicable: Secondary | ICD-10-CM | POA: Insufficient documentation

## 2017-04-04 DIAGNOSIS — Z7982 Long term (current) use of aspirin: Secondary | ICD-10-CM | POA: Diagnosis not present

## 2017-04-04 DIAGNOSIS — M25561 Pain in right knee: Secondary | ICD-10-CM | POA: Diagnosis not present

## 2017-04-04 DIAGNOSIS — I252 Old myocardial infarction: Secondary | ICD-10-CM | POA: Diagnosis not present

## 2017-04-04 DIAGNOSIS — S80211A Abrasion, right knee, initial encounter: Secondary | ICD-10-CM | POA: Diagnosis not present

## 2017-04-04 DIAGNOSIS — Z853 Personal history of malignant neoplasm of breast: Secondary | ICD-10-CM | POA: Insufficient documentation

## 2017-04-04 DIAGNOSIS — Y999 Unspecified external cause status: Secondary | ICD-10-CM | POA: Diagnosis not present

## 2017-04-04 DIAGNOSIS — M25562 Pain in left knee: Secondary | ICD-10-CM | POA: Diagnosis not present

## 2017-04-04 DIAGNOSIS — S8991XA Unspecified injury of right lower leg, initial encounter: Secondary | ICD-10-CM | POA: Diagnosis present

## 2017-04-04 DIAGNOSIS — F17210 Nicotine dependence, cigarettes, uncomplicated: Secondary | ICD-10-CM | POA: Diagnosis not present

## 2017-04-04 DIAGNOSIS — I2510 Atherosclerotic heart disease of native coronary artery without angina pectoris: Secondary | ICD-10-CM | POA: Insufficient documentation

## 2017-04-04 DIAGNOSIS — I10 Essential (primary) hypertension: Secondary | ICD-10-CM | POA: Diagnosis not present

## 2017-04-04 DIAGNOSIS — Z79899 Other long term (current) drug therapy: Secondary | ICD-10-CM | POA: Insufficient documentation

## 2017-04-04 LAB — CBC
HCT: 40.9 % (ref 36.0–46.0)
Hemoglobin: 13.6 g/dL (ref 12.0–15.0)
MCH: 28.9 pg (ref 26.0–34.0)
MCHC: 33.3 g/dL (ref 30.0–36.0)
MCV: 86.8 fL (ref 78.0–100.0)
PLATELETS: 525 10*3/uL — AB (ref 150–400)
RBC: 4.71 MIL/uL (ref 3.87–5.11)
RDW: 15.4 % (ref 11.5–15.5)
WBC: 10 10*3/uL (ref 4.0–10.5)

## 2017-04-04 LAB — COMPREHENSIVE METABOLIC PANEL
ALT: 21 U/L (ref 14–54)
ANION GAP: 8 (ref 5–15)
AST: 28 U/L (ref 15–41)
Albumin: 3.6 g/dL (ref 3.5–5.0)
Alkaline Phosphatase: 87 U/L (ref 38–126)
BILIRUBIN TOTAL: 0.1 mg/dL — AB (ref 0.3–1.2)
BUN: 13 mg/dL (ref 6–20)
CHLORIDE: 108 mmol/L (ref 101–111)
CO2: 25 mmol/L (ref 22–32)
Calcium: 8.8 mg/dL — ABNORMAL LOW (ref 8.9–10.3)
Creatinine, Ser: 0.89 mg/dL (ref 0.44–1.00)
Glucose, Bld: 105 mg/dL — ABNORMAL HIGH (ref 65–99)
POTASSIUM: 3.1 mmol/L — AB (ref 3.5–5.1)
Sodium: 141 mmol/L (ref 135–145)
TOTAL PROTEIN: 6.9 g/dL (ref 6.5–8.1)

## 2017-04-04 LAB — I-STAT TROPONIN, ED: TROPONIN I, POC: 0 ng/mL (ref 0.00–0.08)

## 2017-04-04 LAB — I-STAT CG4 LACTIC ACID, ED: Lactic Acid, Venous: 1.2 mmol/L (ref 0.5–1.9)

## 2017-04-04 MED ORDER — CEFTRIAXONE SODIUM 1 G IJ SOLR
1.0000 g | Freq: Once | INTRAMUSCULAR | Status: AC
  Administered 2017-04-04: 1 g via INTRAMUSCULAR
  Filled 2017-04-04: qty 10

## 2017-04-04 MED ORDER — HYDROCODONE-ACETAMINOPHEN 5-325 MG PO TABS
1.0000 | ORAL_TABLET | Freq: Once | ORAL | Status: AC
  Administered 2017-04-04: 1 via ORAL
  Filled 2017-04-04: qty 1

## 2017-04-04 MED ORDER — LIDOCAINE HCL 1 % IJ SOLN
INTRAMUSCULAR | Status: AC
  Administered 2017-04-04: 2.1 mL
  Filled 2017-04-04: qty 20

## 2017-04-04 MED ORDER — DOXYCYCLINE HYCLATE 100 MG PO TABS
100.0000 mg | ORAL_TABLET | Freq: Once | ORAL | Status: AC
  Administered 2017-04-04: 100 mg via ORAL
  Filled 2017-04-04: qty 1

## 2017-04-04 NOTE — ED Notes (Signed)
Patient transported to X-ray 

## 2017-04-04 NOTE — ED Triage Notes (Signed)
PT C/O RIGHT KNEE PAIN AND SWELLING X2 DAYS, WHICH IS CHRONIC. PT ALSO C/O A RASH, ITCHING, AND SWELLING TO THE LEFT ARM SINCE LAST NIGHT. PT STS SHE HAS A HX OF LYMPH EDEMA TO THAT ARM, AND THE SWELLING IS CAUSING CHEST PAIN AND SOB. DENIES FEVER.
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
## 2017-04-05 ENCOUNTER — Encounter (HOSPITAL_COMMUNITY): Payer: Self-pay | Admitting: Emergency Medicine

## 2017-04-05 LAB — I-STAT CG4 LACTIC ACID, ED: LACTIC ACID, VENOUS: 1.1 mmol/L (ref 0.5–1.9)

## 2017-04-05 MED ORDER — DICLOFENAC SODIUM ER 100 MG PO TB24: 100.0000 mg | ORAL_TABLET | Freq: Every day | ORAL | 0 refills | Status: DC

## 2017-04-05 MED ORDER — DOXYCYCLINE HYCLATE 100 MG PO CAPS: 100.0000 mg | ORAL_CAPSULE | Freq: Two times a day (BID) | ORAL | 0 refills | Status: DC

## 2017-04-08 ENCOUNTER — Other Ambulatory Visit: Payer: Self-pay | Admitting: *Deleted

## 2017-04-08 ENCOUNTER — Other Ambulatory Visit: Payer: Self-pay | Admitting: Family Medicine

## 2017-04-08 DIAGNOSIS — I10 Essential (primary) hypertension: Secondary | ICD-10-CM

## 2017-04-08 DIAGNOSIS — Z853 Personal history of malignant neoplasm of breast: Secondary | ICD-10-CM

## 2017-04-08 DIAGNOSIS — M792 Neuralgia and neuritis, unspecified: Secondary | ICD-10-CM

## 2017-04-08 DIAGNOSIS — G8929 Other chronic pain: Secondary | ICD-10-CM

## 2017-04-08 MED ORDER — METHADONE HCL 5 MG PO TABS: 15.0000 mg | ORAL_TABLET | Freq: Three times a day (TID) | ORAL | 0 refills | Status: DC | PRN

## 2017-04-08 MED FILL — METHADONE HCL 5 MG TABLET: 5 | 30 days supply | Qty: 270 | Fill #0

## 2017-04-13 ENCOUNTER — Encounter: Payer: Medicaid Other | Admitting: Gastroenterology

## 2017-04-15 ENCOUNTER — Ambulatory Visit: Payer: Medicaid Other | Admitting: Physical Therapy

## 2017-04-18 ENCOUNTER — Ambulatory Visit: Payer: Medicaid Other | Attending: Oncology

## 2017-04-18 DIAGNOSIS — R293 Abnormal posture: Secondary | ICD-10-CM | POA: Insufficient documentation

## 2017-04-18 DIAGNOSIS — I972 Postmastectomy lymphedema syndrome: Secondary | ICD-10-CM | POA: Insufficient documentation
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
## 2017-04-18 NOTE — Patient Instructions (Addendum)
Cancer Rehab [phone]    On involved side, make circles _5-10__ times in direction of hollow of neck towards upper back only stretching the skin, never sliding. Then do other side same. Can do both sides at same time as if giving yourself a hug.  Do _1__ times per day.   Deep Effective Breath   Standing, sitting, or laying down, place both hands on the belly. Take a deep breath IN, expanding the belly; then breath OUT, contracting the belly. Repeat __5__ times. Do __2-3__ sessions per day and before your self massage.  Axilla to Inguinal Nodes - Sweep   On LEFT side, make 5 circles at groin at panty line, then pump _5__ times from armpit along side of trunk to outer hip, making your highway  . Do __1_ time per day.  Arm Posterior: Elbow to Shoulder - Sweep   Pump _5__ times from back of elbow to top of shoulder. Then inner to outer upper arm _5_ times, then outer arm again _5_ times. Then back to the highway _2-3_ times. Do _1__ time per day.  ARM: [place] Wrist to Elbow - Sweep   Pump or stationary circles _5__ times from wrist to elbow making sure to do both sides of the forearm. Then retrace your steps to the outer arm, and the highway _2-3_ times. Do _1__ time per day.  ARM: Dorsum of Hand to Shoulder - Sweep   Pump or stationary circles _5__ times on back of hand including knuckle spaces and individual fingers if needed working up towards the wrist, then retrace all your steps working back up the forearm, doing both sides; upper outer arm and back to your highway _2-3_ times each. Then do 5 circles again at LEFT groin where you started! Good job!! Do __1_ time per day.

## 2017-04-27 ENCOUNTER — Telehealth: Payer: Self-pay

## 2017-04-27 NOTE — Telephone Encounter (Signed)
Spoke with pt by phone regarding symptoms she's having:  Pt c/o abdominal pain after eating with some constipation.  Last BM 5/8.  C/o a bump underneath right arm "about the size of my thumb", no redness or drainage that pt can see or feel.  No fevers, or other sx's.  Pt also states that her lymphedema is better in left arm but she still wants "someone to look at it:.  I advised pt to continue to monitor sx and call if anything worsens or bumps doesn't seem to go away.  This RN advised pt that we will call her on Friday [phone] to see how she is doing.  Pt verbalizes understanding

## 2017-05-02 ENCOUNTER — Telehealth: Payer: Self-pay

## 2017-05-02 NOTE — Telephone Encounter (Signed)
Returned pt's call regarding concerns she had last week over abdominal pain and a knot on her arm.  Pt states the knot is no longer palpable and that stomach issues have resolved for now.  Pt advised to call this office for any other concerns that may arise.

## 2017-05-05 ENCOUNTER — Other Ambulatory Visit: Payer: Self-pay | Admitting: *Deleted

## 2017-05-05 DIAGNOSIS — I10 Essential (primary) hypertension: Secondary | ICD-10-CM

## 2017-05-05 DIAGNOSIS — G8929 Other chronic pain: Secondary | ICD-10-CM

## 2017-05-05 DIAGNOSIS — Z853 Personal history of malignant neoplasm of breast: Secondary | ICD-10-CM

## 2017-05-05 DIAGNOSIS — M792 Neuralgia and neuritis, unspecified: Secondary | ICD-10-CM

## 2017-05-05 MED ORDER — METHADONE HCL 5 MG PO TABS: 15.0000 mg | ORAL_TABLET | Freq: Three times a day (TID) | ORAL | 0 refills | Status: DC | PRN

## 2017-05-06 MED FILL — METHADONE HCL 5 MG TABLET: 5 | 30 days supply | Qty: 270 | Fill #0

## 2017-05-09 ENCOUNTER — Ambulatory Visit: Payer: Medicaid Other | Admitting: Family Medicine

## 2017-05-17 ENCOUNTER — Ambulatory Visit: Payer: Medicaid Other | Admitting: Family Medicine

## 2017-06-01 ENCOUNTER — Ambulatory Visit (INDEPENDENT_AMBULATORY_CARE_PROVIDER_SITE_OTHER): Payer: Medicaid Other | Admitting: Family Medicine

## 2017-06-01 ENCOUNTER — Encounter: Payer: Self-pay | Admitting: Family Medicine

## 2017-06-01 VITALS — BP 92/58 | HR 61 | Temp 97.9°F | Ht 65.0 in | Wt 206.0 lb

## 2017-06-01 DIAGNOSIS — L03114 Cellulitis of left upper limb: Secondary | ICD-10-CM

## 2017-06-01 DIAGNOSIS — E876 Hypokalemia: Secondary | ICD-10-CM | POA: Diagnosis not present

## 2017-06-01 DIAGNOSIS — G8929 Other chronic pain: Secondary | ICD-10-CM

## 2017-06-01 DIAGNOSIS — K429 Umbilical hernia without obstruction or gangrene: Secondary | ICD-10-CM | POA: Diagnosis not present

## 2017-06-01 DIAGNOSIS — M25561 Pain in right knee: Secondary | ICD-10-CM

## 2017-06-01 DIAGNOSIS — I952 Hypotension due to drugs: Secondary | ICD-10-CM

## 2017-06-01 MED ORDER — HYDROCODONE-ACETAMINOPHEN 10-325 MG PO TABS: 1.0000 | ORAL_TABLET | Freq: Four times a day (QID) | ORAL | 0 refills | Status: DC | PRN

## 2017-06-01 NOTE — Patient Instructions (Addendum)
It was a pleasure to see you today! We will check your potassium and magnesium levels today. I have placed a referral to General Surgery for your hernia. I have given you a 5 day prescription for Vicodin for your knee and arm pain. Please follow up with Oncology. Your blood pressure was low today, most likely due to too much Metoprolol. Your blood pressure was 84/64 today, but you did not have symptoms. Please return tomorrow for a nurse visit to recheck your blood pressure. If your blood pressure is below 120/80 tonight, please skip tonight dose of Metoprolol. If you skip tonights dose of Metoprolol and your blood pressure is normal tomorrow, we may have you return for another nurse visit the next day to recheck your blood pressure on the normal dose of Metoprolol. If you develop symptoms, please go to the Emergency Department. It was a pleasure having you for a patient for the last several years!  Dr. Gerlean Ren   Hypotension Hypotension, commonly called low blood pressure, is when the force of blood pumping through your arteries is too weak. Arteries are blood vessels that carry blood from the heart throughout the body. When blood pressure is too low, you may not get enough blood to your brain or to the rest of your organs. This can cause weakness, light-headedness, rapid heartbeat, and fainting. Depending on the cause and severity, hypotension may be harmless (benign) or cause serious problems (critical).  What are the signs or symptoms? Symptoms of this condition may include:  Weakness.  Light-headedness.  Dizziness.  Blurred vision.  Fatigue.  Rapid heartbeat.  Fainting, in severe cases.  How is this diagnosed? This condition is diagnosed based on:  Your medical history.  Your symptoms.  Your blood pressure measurement. Your health care provider will check your blood pressure when you are: ? Lying down. ? Sitting. ? Standing.  A blood pressure reading is recorded as two  numbers, such as "120 over 80" (or 120/80). The first ("top") number is called the systolic pressure. It is a measure of the pressure in your arteries as your heart beats. The second ("bottom") number is called the diastolic pressure. It is a measure of the pressure in your arteries when your heart relaxes between beats. Blood pressure is measured in a unit called mm Hg. Healthy blood pressure for adults is 120/80. If your blood pressure is below 90/60, you may be diagnosed with hypotension. Other information or tests that may be used to diagnose hypotension include:  Your other vital signs, such as your heart rate and temperature.  Blood tests.  Tilt table test. For this test, you will be safely secured to a table that moves you from a lying position to an upright position. Your heart rhythm and blood pressure will be monitored during the test.  How is this treated? Treatment for this condition may include:  Changing your diet. This may involve eating more salt (sodium) or drinking more water.  Taking medicines to raise your blood pressure.  Changing the dosage of certain medicines you are taking that might be lowering your blood pressure.  Wearing compression stockings. These stockings help to prevent blood clots and reduce swelling in your legs.  In some cases, you may need to go to the hospital for:  Fluid replacement. This means you will receive fluids through an IV tube.  Blood replacement. This means you will receive donated blood through an IV tube (transfusion).  Treating an infection or heart problems, if this applies.  Monitoring. You may need to be monitored while medicines that you are taking wear off.  Follow these instructions at home: Eating and drinking   Drink enough fluid to keep your urine clear or pale yellow.  Eat a healthy diet and follow instructions from your health care provider about eating or drinking restrictions. A healthy diet includes: ? Fresh  fruits and vegetables. ? Whole grains. ? Lean meats. ? Low-fat dairy products.  Eat extra salt only as directed. Do not add extra salt to your diet unless your health care provider told you to do that.  Eat frequent, small meals.  Avoid standing up suddenly after eating. Medicines  Take over-the-counter and prescription medicines only as told by your health care provider. ? Follow instructions from your health care provider about changing the dosage of your current medicines, if this applies. ? Do not stop or adjust any of your medicines on your own. General instructions  Wear compression stockings as told by your health care provider.  Get up slowly from lying down or sitting positions. This gives your blood pressure a chance to adjust.  Avoid hot showers and excessive heat as directed by your health care provider.  Return to your normal activities as told by your health care provider. Ask your health care provider what activities are safe for you.  Do not use any products that contain nicotine or tobacco, such as cigarettes and e-cigarettes. If you need help quitting, ask your health care provider.  Keep all follow-up visits as told by your health care provider. This is important. Contact a health care provider if:  You vomit.  You have diarrhea.  You have a fever for more than 2-3 days.  You feel more thirsty than usual.  You feel weak and tired. Get help right away if:  You have chest pain.  You have a fast or irregular heartbeat.  You develop numbness in any part of your body.  You cannot move your arms or your legs.  You have trouble speaking.  You become sweaty or feel light-headed.  You faint.  You feel short of breath.  You have trouble staying awake.  You feel confused. This information is not intended to replace advice given to you by your health care provider. Make sure you discuss any questions you have with your health care provider. Document  Released: 12/06/2005 Document Revised: 06/25/2016 Document Reviewed: 05/27/2016 Elsevier Interactive Patient Education  2018 [employer].

## 2017-06-01 NOTE — Progress Notes (Signed)
Subjective:     Patient ID: [name], female   DOB: 10/30/1961, 56 y.o.   MRN: [id number]  HPI [name] is a 56 year old female presenting today for multiple concerns.  Presented to ED on 04/04/2017 for left arm pain and right knee pain. Diagnosed with left arm cellulitis prescription for doxycycline given. Prescription for Voltaren gel given, however patient was unable to fill this. Since ED visit, reports improvement of left arm rash. Denies fever. Swelling of left arm at baseline, with history of lymphedema. Continues to note left arm pain and right knee pain and requests pain medicine for this. Currently on methadone however she is not due for another refill until June 20. Modic, which is previously relieved her pain, causes stomach pain. Reports pain is previously relieved by Vicodin. Denies any pain contracts with other providers. [place] narcotics database reviewed with no red flags; last methadone refill on May 15 for 30 days.  Patient also has concerns for a hernia over her abdomen. Notes bulging knot over bellybutton at site of previous hernia repair. Reports history of 3 hernia repairs, most recently at 56 years old. Denies any pain at site. Wishes to follow-up with general surgery to see if further surgery is needed.  Also noted to be hypotensive at today's visit. Reports she actually took too many metoprolol last night because she forgot she had already taken it. When counting her pills this morning she realized she had one pure than normal. Denies dizziness, blurred vision, syncope. Has blood pressure cuff at home.  Review of Systems Per history of present illness    Objective:   Physical Exam  Constitutional: She appears well-developed and well-nourished. No distress.  Cardiovascular: Normal rate and regular rhythm.   No murmur heard. Pulmonary/Chest: Effort normal. No respiratory distress. She has no wheezes.  Abdominal: Soft. She exhibits no distension. There is no  tenderness.  Small hernia noted at site of scar from previous hernia surgery  Musculoskeletal: She exhibits no edema.  Lymphedema of left arm at baseline. Lateral joint line tenderness of right knee. Positive grind test of right knee  Skin: No rash noted. No erythema.  Psychiatric: She has a normal mood and affect. Her behavior is normal.      Assessment and Plan:      1. Hypotension Initially 92/58, 84/64 on recheck. Asymptomatic. Currently on metoprolol and suspected to be secondary to accidental doubling of dose. To schedule nurse's visit for recheck tomorrow. Discussed holding metoprolol if her blood pressure remains low tonight. If she holds her metoprolol and blood pressure is normal at nurse's visit, may require a second nurse's visit to see what her blood pressure is on her normal dose of metoprolol. To go to ED if symptoms develop. Handout on hypotension given.  2. Chronic pain of right knee Unchanged. Prescription for Vicodin given for 5 day course. [place] narcotics database reviewed without red flags. Follow-up with oncologist on 6/20 for refill of pain medication. Follows with orthopedics. Refuses surgery. Consider physical therapy for known right lateral meniscal tear (demonstrated on MRI from February 2018).  3. Cellulitis of left upper extremity Resolved. No further antibodies needed.  4. Umbilical hernia without obstruction and without gangrene Referral to general surgery for further evaluation.  5. Hypokalemia Noted on chart review, BMP from April 2018 with hypokalemia to 3.1. Will recheck CMP as well as magnesium level.

## 2017-06-02 ENCOUNTER — Telehealth: Payer: Self-pay | Admitting: Family Medicine

## 2017-06-02 LAB — CMP14+EGFR
A/G RATIO: 1.8 (ref 1.2–2.2)
ALBUMIN: 4.2 g/dL (ref 3.5–5.5)
ALT: 21 IU/L (ref 0–32)
AST: 23 IU/L (ref 0–40)
Alkaline Phosphatase: 84 IU/L (ref 39–117)
BILIRUBIN TOTAL: 0.4 mg/dL (ref 0.0–1.2)
BUN / CREAT RATIO: 13 (ref 9–23)
BUN: 12 mg/dL (ref 6–24)
CHLORIDE: 100 mmol/L (ref 96–106)
CO2: 24 mmol/L (ref 20–29)
Calcium: 9.5 mg/dL (ref 8.7–10.2)
Creatinine, Ser: 0.95 mg/dL (ref 0.57–1.00)
GFR calc non Af Amer: 69 mL/min/{1.73_m2} (ref >59)
GFR, EST AFRICAN AMERICAN: 79 mL/min/{1.73_m2} (ref >59)
GLOBULIN, TOTAL: 2.3 g/dL (ref 1.5–4.5)
GLUCOSE: 90 mg/dL (ref 65–99)
Potassium: 4.2 mmol/L (ref 3.5–5.2)
SODIUM: 142 mmol/L (ref 134–144)
TOTAL PROTEIN: 6.5 g/dL (ref 6.0–8.5)

## 2017-06-02 LAB — MAGNESIUM: Magnesium: 2.3 mg/dL (ref 1.6–2.3)

## 2017-06-02 NOTE — Telephone Encounter (Signed)
Please let [name] know her labs obtained 6/13 are normal.

## 2017-06-02 NOTE — Telephone Encounter (Signed)
LVM for pt to call the office. If pt calls, please inform her of the info below.  Ottis Stain, CMA

## 2017-06-03 ENCOUNTER — Telehealth: Payer: Self-pay

## 2017-06-03 DIAGNOSIS — M792 Neuralgia and neuritis, unspecified: Secondary | ICD-10-CM

## 2017-06-03 DIAGNOSIS — G8929 Other chronic pain: Secondary | ICD-10-CM

## 2017-06-03 DIAGNOSIS — I10 Essential (primary) hypertension: Secondary | ICD-10-CM

## 2017-06-03 DIAGNOSIS — Z853 Personal history of malignant neoplasm of breast: Secondary | ICD-10-CM

## 2017-06-03 MED ORDER — METHADONE HCL 5 MG PO TABS: 15.0000 mg | ORAL_TABLET | Freq: Three times a day (TID) | ORAL | 0 refills | Status: DC | PRN

## 2017-06-03 MED FILL — METHADONE HCL 5 MG TABLET: 5 | 30 days supply | Qty: 270 | Fill #0

## 2017-06-03 NOTE — Telephone Encounter (Signed)
Pt called that she has family emergency and wants to pick up her methadone rx in about 1 hour.  rx prepared and placed in pick up book

## 2017-06-09 ENCOUNTER — Encounter: Payer: Self-pay | Admitting: *Deleted

## 2017-06-09 NOTE — Telephone Encounter (Signed)
Mailed letter. Kacee Koren, Salome Spotted, CMA

## 2017-06-16 ENCOUNTER — Ambulatory Visit: Payer: Medicaid Other | Attending: Oncology | Admitting: Physical Therapy

## 2017-06-16 ENCOUNTER — Encounter: Payer: Self-pay | Admitting: Physical Therapy

## 2017-06-16 DIAGNOSIS — R293 Abnormal posture: Secondary | ICD-10-CM | POA: Diagnosis present

## 2017-06-16 DIAGNOSIS — I972 Postmastectomy lymphedema syndrome: Secondary | ICD-10-CM | POA: Diagnosis not present
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
## 2017-06-22 ENCOUNTER — Other Ambulatory Visit: Payer: Self-pay | Admitting: Family Medicine

## 2017-06-27 ENCOUNTER — Telehealth: Payer: Self-pay | Admitting: *Deleted

## 2017-06-27 NOTE — Telephone Encounter (Signed)
Patient stated that the pharmacy only has the 100 mg allopurinol tablets.  She also wanted to let her PCP know that she had a painful knot in her left leg. She think is it going away, but will schedule an appointment.  Derl Barrow, RN

## 2017-06-27 NOTE — Telephone Encounter (Signed)
Please call patient and ask if she can go to another pharmacy. This is the best medicine we can do for her gout. Thanks.

## 2017-06-27 NOTE — Telephone Encounter (Signed)
[facility] stating [facility] is on backorder.  Patient is still in pain.  Please send in a different medication.  Derl Barrow, RN

## 2017-06-28 NOTE — Telephone Encounter (Signed)
[facility]. [facility] have the 100mg  tablets and will give her 3 100mg  at once. They will notify her that refill is ready.

## 2017-06-30 ENCOUNTER — Other Ambulatory Visit: Payer: Self-pay | Admitting: General Surgery

## 2017-06-30 ENCOUNTER — Encounter: Payer: Self-pay | Admitting: Internal Medicine

## 2017-06-30 DIAGNOSIS — R590 Localized enlarged lymph nodes: Secondary | ICD-10-CM

## 2017-07-01 ENCOUNTER — Other Ambulatory Visit: Payer: Self-pay | Admitting: *Deleted

## 2017-07-01 DIAGNOSIS — M792 Neuralgia and neuritis, unspecified: Secondary | ICD-10-CM

## 2017-07-01 DIAGNOSIS — G8929 Other chronic pain: Secondary | ICD-10-CM

## 2017-07-01 DIAGNOSIS — I10 Essential (primary) hypertension: Secondary | ICD-10-CM

## 2017-07-01 DIAGNOSIS — Z853 Personal history of malignant neoplasm of breast: Secondary | ICD-10-CM

## 2017-07-01 MED ORDER — METHADONE HCL 5 MG PO TABS: 15.0000 mg | ORAL_TABLET | Freq: Three times a day (TID) | ORAL | 0 refills | Status: DC | PRN

## 2017-07-01 MED FILL — METHADONE HCL 5 MG TABLET: 5 | 30 days supply | Qty: 270 | Fill #0

## 2017-07-12 ENCOUNTER — Inpatient Hospital Stay: Admission: RE | Admit: 2017-07-12 | Payer: Medicaid Other | Source: Ambulatory Visit

## 2017-07-21 ENCOUNTER — Other Ambulatory Visit: Payer: Medicaid Other

## 2017-07-22 ENCOUNTER — Other Ambulatory Visit: Payer: Self-pay | Admitting: Adult Health

## 2017-07-22 DIAGNOSIS — C50811 Malignant neoplasm of overlapping sites of right female breast: Secondary | ICD-10-CM

## 2017-07-25 ENCOUNTER — Other Ambulatory Visit: Payer: Self-pay | Admitting: General Surgery

## 2017-07-25 ENCOUNTER — Other Ambulatory Visit: Payer: Medicaid Other

## 2017-07-25 ENCOUNTER — Other Ambulatory Visit (HOSPITAL_BASED_OUTPATIENT_CLINIC_OR_DEPARTMENT_OTHER): Payer: Medicaid Other

## 2017-07-25 ENCOUNTER — Ambulatory Visit
Admission: RE | Admit: 2017-07-25 | Discharge: 2017-07-25 | Disposition: A | Discharge status: Home / Self Care | Payer: Medicaid Other | Source: Ambulatory Visit | Attending: General Surgery | Admitting: General Surgery

## 2017-07-25 ENCOUNTER — Inpatient Hospital Stay: Admission: RE | Admit: 2017-07-25 | Payer: Medicaid Other | Source: Ambulatory Visit

## 2017-07-25 DIAGNOSIS — C50412 Malignant neoplasm of upper-outer quadrant of left female breast: Secondary | ICD-10-CM

## 2017-07-25 DIAGNOSIS — R590 Localized enlarged lymph nodes: Secondary | ICD-10-CM

## 2017-07-25 DIAGNOSIS — N6320 Unspecified lump in the left breast, unspecified quadrant: Secondary | ICD-10-CM

## 2017-07-25 DIAGNOSIS — C50811 Malignant neoplasm of overlapping sites of right female breast: Secondary | ICD-10-CM

## 2017-07-25 LAB — COMPREHENSIVE METABOLIC PANEL
ALBUMIN: 3.7 g/dL (ref 3.5–5.0)
ALK PHOS: 83 U/L (ref 40–150)
ALT: 15 U/L (ref 0–55)
AST: 19 U/L (ref 5–34)
Anion Gap: 8 mEq/L (ref 3–11)
BILIRUBIN TOTAL: 0.37 mg/dL (ref 0.20–1.20)
BUN: 13.4 mg/dL (ref 7.0–26.0)
CALCIUM: 9.6 mg/dL (ref 8.4–10.4)
CO2: 28 mEq/L (ref 22–29)
Chloride: 103 mEq/L (ref 98–109)
Creatinine: 0.9 mg/dL (ref 0.6–1.1)
EGFR: 89 mL/min/{1.73_m2} — AB (ref >90)
Glucose: 97 mg/dl (ref 70–140)
POTASSIUM: 3.3 meq/L — AB (ref 3.5–5.1)
Sodium: 140 mEq/L (ref 136–145)
TOTAL PROTEIN: 6.9 g/dL (ref 6.4–8.3)

## 2017-07-25 LAB — CBC WITH DIFFERENTIAL/PLATELET
BASO%: 0.7 % (ref 0.0–2.0)
BASOS ABS: 0.1 10*3/uL (ref 0.0–0.1)
EOS ABS: 0.2 10*3/uL (ref 0.0–0.5)
EOS%: 2.1 % (ref 0.0–7.0)
HEMATOCRIT: 43.3 % (ref 34.8–46.6)
HEMOGLOBIN: 14.4 g/dL (ref 11.6–15.9)
LYMPH#: 2.5 10*3/uL (ref 0.9–3.3)
LYMPH%: 25 % (ref 14.0–49.7)
MCH: 29.1 pg (ref 25.1–34.0)
MCHC: 33.3 g/dL (ref 31.5–36.0)
MCV: 87.4 fL (ref 79.5–101.0)
MONO#: 0.7 10*3/uL (ref 0.1–0.9)
MONO%: 6.9 % (ref 0.0–14.0)
NEUT%: 65.3 % (ref 38.4–76.8)
NEUTROS ABS: 6.5 10*3/uL (ref 1.5–6.5)
Platelets: 538 10*3/uL — ABNORMAL HIGH (ref 145–400)
RBC: 4.96 10*6/uL (ref 3.70–5.45)
RDW: 15.2 % — AB (ref 11.2–14.5)
WBC: 10 10*3/uL (ref 3.9–10.3)

## 2017-07-26 ENCOUNTER — Other Ambulatory Visit: Payer: Medicaid Other

## 2017-07-27 ENCOUNTER — Telehealth: Payer: Self-pay | Admitting: Emergency Medicine

## 2017-07-27 NOTE — Telephone Encounter (Signed)
Patient returned this nurses call. Read her K+ results and recommended per Jamelle Haring, NP that the patient eat K+ rich foods to help elevate K+.   Patient understands and stated "I will eat foods with more potassium in them"  Patient also requested to pick up methadone RX 3 days early. Dr.Magrinats nurse made aware.

## 2017-07-27 NOTE — Telephone Encounter (Signed)
Left VM with patient to call this nurse back regarding low K+

## 2017-07-28 ENCOUNTER — Telehealth: Payer: Self-pay | Admitting: Emergency Medicine

## 2017-07-28 NOTE — Telephone Encounter (Signed)
Spoke with patient this morning. This nurse told her she could pick up her rx for methadone after lunch tomorrow 8/10. Patient verbalized understanding. Nurse Elmyra Ricks, RN made aware.

## 2017-07-29 ENCOUNTER — Other Ambulatory Visit: Payer: Self-pay

## 2017-07-29 DIAGNOSIS — I10 Essential (primary) hypertension: Secondary | ICD-10-CM

## 2017-07-29 DIAGNOSIS — G8929 Other chronic pain: Secondary | ICD-10-CM

## 2017-07-29 DIAGNOSIS — Z853 Personal history of malignant neoplasm of breast: Secondary | ICD-10-CM

## 2017-07-29 DIAGNOSIS — M792 Neuralgia and neuritis, unspecified: Secondary | ICD-10-CM

## 2017-07-29 MED ORDER — METHADONE HCL 5 MG PO TABS: 15.0000 mg | ORAL_TABLET | Freq: Three times a day (TID) | ORAL | 0 refills | Status: DC | PRN

## 2017-08-01 ENCOUNTER — Telehealth: Payer: Self-pay | Admitting: *Deleted

## 2017-08-01 ENCOUNTER — Ambulatory Visit (HOSPITAL_BASED_OUTPATIENT_CLINIC_OR_DEPARTMENT_OTHER): Payer: Medicaid Other | Admitting: Oncology

## 2017-08-01 VITALS — BP 132/70 | HR 60 | Temp 98.2°F | Resp 18 | Ht 65.0 in | Wt 210.9 lb

## 2017-08-01 DIAGNOSIS — D473 Essential (hemorrhagic) thrombocythemia: Secondary | ICD-10-CM

## 2017-08-01 DIAGNOSIS — C50412 Malignant neoplasm of upper-outer quadrant of left female breast: Secondary | ICD-10-CM

## 2017-08-01 DIAGNOSIS — C50811 Malignant neoplasm of overlapping sites of right female breast: Secondary | ICD-10-CM

## 2017-08-01 DIAGNOSIS — C773 Secondary and unspecified malignant neoplasm of axilla and upper limb lymph nodes: Secondary | ICD-10-CM

## 2017-08-01 DIAGNOSIS — C50911 Malignant neoplasm of unspecified site of right female breast: Secondary | ICD-10-CM

## 2017-08-01 DIAGNOSIS — M792 Neuralgia and neuritis, unspecified: Secondary | ICD-10-CM

## 2017-08-01 DIAGNOSIS — Z170 Estrogen receptor positive status [ER+]: Secondary | ICD-10-CM

## 2017-08-01 DIAGNOSIS — Z853 Personal history of malignant neoplasm of breast: Secondary | ICD-10-CM

## 2017-08-01 DIAGNOSIS — Z79810 Long term (current) use of selective estrogen receptor modulators (SERMs): Secondary | ICD-10-CM | POA: Diagnosis not present

## 2017-08-01 DIAGNOSIS — G8929 Other chronic pain: Secondary | ICD-10-CM | POA: Diagnosis not present

## 2017-08-01 DIAGNOSIS — I10 Essential (primary) hypertension: Secondary | ICD-10-CM

## 2017-08-01 MED ORDER — TAMOXIFEN CITRATE 20 MG PO TABS: 20.0000 mg | ORAL_TABLET | Freq: Every day | ORAL | 4 refills | Status: DC

## 2017-08-01 MED ORDER — METHADONE HCL 5 MG PO TABS: 15.0000 mg | ORAL_TABLET | Freq: Three times a day (TID) | ORAL | 0 refills | Status: DC | PRN
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
## 2017-08-02 ENCOUNTER — Telehealth: Payer: Self-pay | Admitting: *Deleted

## 2017-08-02 NOTE — Telephone Encounter (Signed)
"  Did you all send the authorization?  Has this been approved yet?  I am in pain."   Confirmed request for authorization from insurance with [facility] office information for need has been faxed.  Office is awaiting authorization from insurance to [facility].

## 2017-08-02 NOTE — Telephone Encounter (Signed)
Call received this morning in reference to 08-01-2017 methadone prescription prior authorization.  Patient called yesterday.  No request received from Pharmacy.  [facility] [place].  Pharmacist Ebony Hail reports high dose and quantity is reason for denial.  One hundred-twenty pills is allowed every 30-days.  Will notify [facility].  Patient notified of this report from [facility].

## 2017-08-02 NOTE — Telephone Encounter (Signed)
This RN received PA for completion of data for methadone.  Completed and faxed with confirmation as received at 1054 am

## 2017-08-04 ENCOUNTER — Emergency Department (HOSPITAL_COMMUNITY)
Admission: EM | Admit: 2017-08-04 | Discharge: 2017-08-04 | Discharge status: Against Medical Advice | Payer: Medicaid Other

## 2017-08-04 ENCOUNTER — Other Ambulatory Visit: Payer: Self-pay | Admitting: *Deleted

## 2017-08-04 ENCOUNTER — Encounter: Payer: Self-pay | Admitting: Oncology

## 2017-08-04 NOTE — ED Notes (Signed)
Looked for pt outside, in lobby restroom and called again in lobby. Without response

## 2017-08-04 NOTE — Progress Notes (Signed)
Pt's Methadone was approved from 08/02/17 to 07/28/18.

## 2017-08-04 NOTE — ED Notes (Signed)
Pt called for v/s x2, no response from lobby 

## 2017-08-05 ENCOUNTER — Other Ambulatory Visit: Payer: Self-pay | Admitting: *Deleted

## 2017-08-05 MED ORDER — NITROGLYCERIN 0.4 MG SL SUBL: 0.4000 mg | SUBLINGUAL_TABLET | SUBLINGUAL | 0 refills | Status: DC | PRN

## 2017-08-05 MED ORDER — ALLOPURINOL 300 MG PO TABS: 300.0000 mg | ORAL_TABLET | Freq: Every day | ORAL | 2 refills | Status: DC

## 2017-08-08 ENCOUNTER — Encounter: Payer: Self-pay | Admitting: Family Medicine

## 2017-08-08 ENCOUNTER — Ambulatory Visit (INDEPENDENT_AMBULATORY_CARE_PROVIDER_SITE_OTHER): Payer: Medicaid Other | Admitting: Family Medicine

## 2017-08-08 VITALS — BP 140/94 | HR 69 | Temp 97.7°F | Ht 65.0 in | Wt 212.0 lb

## 2017-08-08 DIAGNOSIS — G8929 Other chronic pain: Secondary | ICD-10-CM

## 2017-08-08 DIAGNOSIS — M25561 Pain in right knee: Secondary | ICD-10-CM

## 2017-08-08 MED ORDER — HYDROCODONE-ACETAMINOPHEN 10-325 MG PO TABS: 1.0000 | ORAL_TABLET | Freq: Four times a day (QID) | ORAL | 0 refills | Status: DC | PRN

## 2017-08-08 NOTE — Patient Instructions (Signed)
[name], you were seen today for right knee pain with concern for a possible clot in your leg.  Fortunately I do not believe that you have a clot in your leg.  As you been told in the past you probably do need the knee replacement and I would recommend following up with [facility] orthopedics at your next appointment.  This is probably going to be your best option.  I have refilled your pain medication and will be giving you 20 pills. You can take 1 pill every 6 hours as needed.  Remember that this medication is sedating and I would avoid any driving, alcohol use or additional use of methadone while using this medication.   In between using this medication you can also use Tylenol or ibuprofen as needed and you can rest her leg and ice it as needed.   It was very nice meeting you today. Please follow-up with Dr. Juanito Doom as needed.   Take care, Glorya Bartley L. Rosalyn Gess, [facility] Resident PGY-2 08/08/2017 2:28 PM

## 2017-08-08 NOTE — Progress Notes (Signed)
    Subjective:  [name] is a 56 y.o. female who presents to the [facility] today with a chief complaint of leg pain  HPI:  Right leg and knee pain: Reporting one week of leg pain worse with standing and bending knee. Improved with vicodin, which she has been on consistently since at least 5/17.  Has a history of cancer and had bilateral mastectomy in the past. Also smokes. No other risk factors for hypercoagulability. She is concerned that this might be a clot in her leg.  Has never had a clot before or PE.  Does not take any blood thinners. Does take aspirin 325mg  daily.  Right knee pain is chronic and she has been told she will require a knee replacement in the future.  She reports that she has an appointment next month at [facility].   Tobacco use reviewed Medication: reviewed and updated ROS: see HPI   Objective:  Physical Exam: BP (!) 140/94   Pulse 69   Temp 97.7 F (36.5 C) (Oral)   Ht 5\' 5"  (1.651 m)   Wt 212 lb (96.2 kg)   LMP 08/07/2011   SpO2 98%   BMI 35.28 kg/m   Gen: 56yo F in NAD, resting comfortably CV: RRR with no murmurs appreciated Pulm: NWOB, CTAB with no crackles, wheezes, or rhonchi GI: Normal bowel sounds present. Soft, Nontender, Nondistended. MSK: no edema, cyanosis, or clubbing noted, no palpable cords on right lower extremity, no erythema, warmth or TTP, knee with medial and lateral joint-line tenderness Skin: warm, dry Neuro: grossly normal, moves all extremities Psych: Normal affect and thought content  No results found. However, due to the size of the patient record, not all encounters were searched. Please check Results Review for a complete set of results.   Assessment/Plan:  Leg pain: No palpable cords, erythema, warmth or tenderness.  No CP, SOB, satting appropriately on room air. Very unlikely to have DVT. Knee pain is chronic. No red flags. Needs replacement per ortho.  - refilled hydrocodone-acetaminophen 10-325mg  1 tab q8hrs  PRN (dispensed 20) - follow up with ortho

## 2017-08-09 ENCOUNTER — Ambulatory Visit (HOSPITAL_COMMUNITY): Payer: Medicaid Other

## 2017-08-10 ENCOUNTER — Ambulatory Visit (HOSPITAL_COMMUNITY): Payer: Medicaid Other

## 2017-08-15 ENCOUNTER — Other Ambulatory Visit: Payer: Self-pay | Admitting: *Deleted

## 2017-08-15 ENCOUNTER — Other Ambulatory Visit: Payer: Self-pay | Admitting: Oncology

## 2017-08-15 ENCOUNTER — Ambulatory Visit (HOSPITAL_COMMUNITY)
Admission: RE | Admit: 2017-08-15 | Discharge: 2017-08-15 | Disposition: A | Discharge status: Home / Self Care | Payer: Medicaid Other | Source: Ambulatory Visit | Attending: Oncology | Admitting: Oncology

## 2017-08-15 DIAGNOSIS — R1905 Periumbilic swelling, mass or lump: Secondary | ICD-10-CM

## 2017-08-15 DIAGNOSIS — Z170 Estrogen receptor positive status [ER+]: Secondary | ICD-10-CM | POA: Diagnosis not present

## 2017-08-15 DIAGNOSIS — C50912 Malignant neoplasm of unspecified site of left female breast: Principal | ICD-10-CM

## 2017-08-15 DIAGNOSIS — R1900 Intra-abdominal and pelvic swelling, mass and lump, unspecified site: Secondary | ICD-10-CM | POA: Insufficient documentation

## 2017-08-15 DIAGNOSIS — C50412 Malignant neoplasm of upper-outer quadrant of left female breast: Secondary | ICD-10-CM | POA: Diagnosis present

## 2017-08-15 DIAGNOSIS — C50911 Malignant neoplasm of unspecified site of right female breast: Secondary | ICD-10-CM

## 2017-08-15 DIAGNOSIS — C50811 Malignant neoplasm of overlapping sites of right female breast: Secondary | ICD-10-CM

## 2017-08-16 ENCOUNTER — Other Ambulatory Visit: Payer: Self-pay | Admitting: *Deleted

## 2017-08-17 ENCOUNTER — Other Ambulatory Visit: Payer: Self-pay | Admitting: *Deleted

## 2017-08-17 MED ORDER — TAMOXIFEN CITRATE 20 MG PO TABS: 20.0000 mg | ORAL_TABLET | Freq: Every day | ORAL | 4 refills | Status: DC

## 2017-08-23 ENCOUNTER — Other Ambulatory Visit: Payer: Self-pay

## 2017-08-23 ENCOUNTER — Telehealth: Payer: Self-pay

## 2017-08-23 DIAGNOSIS — C50412 Malignant neoplasm of upper-outer quadrant of left female breast: Secondary | ICD-10-CM

## 2017-08-23 DIAGNOSIS — C50811 Malignant neoplasm of overlapping sites of right female breast: Secondary | ICD-10-CM

## 2017-08-23 DIAGNOSIS — Z170 Estrogen receptor positive status [ER+]: Principal | ICD-10-CM

## 2017-08-23 NOTE — Telephone Encounter (Signed)
Spoke with pt by phone and informed her of ultrasound appt scheduled for 9/10 at 1pm.  Pt verbalizes understanding.

## 2017-08-24 ENCOUNTER — Other Ambulatory Visit: Payer: Self-pay

## 2017-08-24 MED ORDER — TAMOXIFEN CITRATE 20 MG PO TABS: 20.0000 mg | ORAL_TABLET | Freq: Every day | ORAL | 4 refills | Status: DC
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
## 2017-08-26 ENCOUNTER — Encounter: Payer: Self-pay | Admitting: Family Medicine

## 2017-08-26 ENCOUNTER — Ambulatory Visit (INDEPENDENT_AMBULATORY_CARE_PROVIDER_SITE_OTHER): Payer: Medicaid Other | Admitting: Family Medicine

## 2017-08-26 DIAGNOSIS — M25561 Pain in right knee: Secondary | ICD-10-CM | POA: Diagnosis not present

## 2017-08-26 DIAGNOSIS — H9311 Tinnitus, right ear: Secondary | ICD-10-CM

## 2017-08-26 DIAGNOSIS — G8929 Other chronic pain: Secondary | ICD-10-CM | POA: Diagnosis not present

## 2017-08-26 MED ORDER — HYDROCODONE-ACETAMINOPHEN 10-325 MG PO TABS: 1.0000 | ORAL_TABLET | Freq: Four times a day (QID) | ORAL | 0 refills | Status: DC | PRN

## 2017-08-26 NOTE — Patient Instructions (Signed)
Thank you for coming in today, it was so nice to see you! Today we talked about:    Knee pain: We have given you a refill of your pain medication. Please only take when you are in severe pain as this medications has side effects including respiratory and cardiac depression.   Follow up with orthopedics   Cut down aspirin to 81 mg daily   If you have any questions or concerns, please do not hesitate to call the office at [phone]. You can also message me directly via MyChart.   Sincerely,  Smitty Cords, MD

## 2017-08-28 ENCOUNTER — Other Ambulatory Visit: Payer: Self-pay | Admitting: Oncology

## 2017-08-28 DIAGNOSIS — C50412 Malignant neoplasm of upper-outer quadrant of left female breast: Secondary | ICD-10-CM

## 2017-08-28 DIAGNOSIS — C50911 Malignant neoplasm of unspecified site of right female breast: Secondary | ICD-10-CM

## 2017-08-28 DIAGNOSIS — C50811 Malignant neoplasm of overlapping sites of right female breast: Secondary | ICD-10-CM

## 2017-08-28 DIAGNOSIS — C50912 Malignant neoplasm of unspecified site of left female breast: Secondary | ICD-10-CM

## 2017-08-28 DIAGNOSIS — Z170 Estrogen receptor positive status [ER+]: Principal | ICD-10-CM

## 2017-08-28 DIAGNOSIS — R1905 Periumbilic swelling, mass or lump: Secondary | ICD-10-CM

## 2017-08-29 ENCOUNTER — Ambulatory Visit: Payer: Medicaid Other | Admitting: Internal Medicine

## 2017-08-29 ENCOUNTER — Ambulatory Visit (HOSPITAL_COMMUNITY): Payer: Medicaid Other

## 2017-08-29 ENCOUNTER — Telehealth: Payer: Self-pay | Admitting: Internal Medicine

## 2017-08-29 DIAGNOSIS — H9319 Tinnitus, unspecified ear: Secondary | ICD-10-CM | POA: Insufficient documentation

## 2017-08-29 NOTE — Assessment & Plan Note (Addendum)
Chronic right knee pain. Exam showing diffuse tenderness, mostly on joint lines with effusions. MRI of knee in Feb 2018 showing numerous things including; 1. patellofemoral and lateral femorotibial chondromalacia with degenerative subchondral cystic change involving the lateral patellar facet and lateral tibial plateau with ?reactive bone marrow edema involving the lateral femorotibial compartment. 2. Macerated appearance of the anterior horn of the lateral meniscus with laterally subluxed body of the meniscus. Mucoid degeneration of the medial meniscus without surfacing tear. 3. Intact cruciate and collateral ligaments. 4. Large suprapatellar joint effusion. Patient notes that the only thing that has been helping her is the Norco that she was given last month. She notes that she has an orthopedic appointment coming up in mid October to address her knee.  - [place] database reviewed, no red flags - Discussed that I would provide her ONE refill for her Norco only to last her until her orthopedic appointment in mid October, then no more refills - Patient has hx of drug abuse and is also on methadone so we will need to be extra cautious with narcotics for pain - Avoid NSAIDs due to CAD - Tylenol PRN - Patient will need to work on losing weight to take pressure off her knees, discussed this today

## 2017-08-29 NOTE — Assessment & Plan Note (Addendum)
Right ear tinnitus. Differentials include medications (on ASA 325 mg daily), hypertension, chemotherapy (s/p bilateral breast cancer and mastectomy). Tried to look in Epic and see why she is on full dose ASA, could not find a reason. Could also be meniere's. ENT exam normal.  - Will decrease ASA to 81 mg daily  - Red flag symptoms discussed - Return in 2-4 weeks if symptoms persist with decreased dose of ASA

## 2017-08-29 NOTE — Telephone Encounter (Signed)
No charge. 

## 2017-08-30 ENCOUNTER — Telehealth: Payer: Self-pay | Admitting: *Deleted

## 2017-08-30 ENCOUNTER — Other Ambulatory Visit: Payer: Self-pay

## 2017-08-30 DIAGNOSIS — I10 Essential (primary) hypertension: Secondary | ICD-10-CM

## 2017-08-30 DIAGNOSIS — G8929 Other chronic pain: Secondary | ICD-10-CM

## 2017-08-30 DIAGNOSIS — Z853 Personal history of malignant neoplasm of breast: Secondary | ICD-10-CM

## 2017-08-30 DIAGNOSIS — M792 Neuralgia and neuritis, unspecified: Secondary | ICD-10-CM

## 2017-08-30 MED ORDER — METHADONE HCL 5 MG PO TABS: 15.0000 mg | ORAL_TABLET | Freq: Three times a day (TID) | ORAL | 0 refills | Status: DC | PRN

## 2017-08-30 NOTE — Telephone Encounter (Signed)
Call from pt requesting Methadone refill to be ready on 09/01/17. She reports last month she had to wait 3 days for it to "clear". Message to pod for 9/13 refill. (Last filled 8/13)

## 2017-09-01 ENCOUNTER — Ambulatory Visit (HOSPITAL_COMMUNITY): Admission: RE | Admit: 2017-09-01 | Payer: Medicaid Other | Source: Ambulatory Visit

## 2017-09-01 ENCOUNTER — Ambulatory Visit (HOSPITAL_COMMUNITY): Payer: Medicaid Other

## 2017-09-01 ENCOUNTER — Other Ambulatory Visit: Payer: Self-pay | Admitting: Oncology
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
## 2017-09-07 ENCOUNTER — Ambulatory Visit (HOSPITAL_COMMUNITY): Payer: Medicaid Other

## 2017-09-12 ENCOUNTER — Ambulatory Visit (HOSPITAL_COMMUNITY)
Admission: RE | Admit: 2017-09-12 | Discharge: 2017-09-12 | Disposition: A | Discharge status: Home / Self Care | Payer: Medicaid Other | Source: Ambulatory Visit | Attending: Oncology | Admitting: Oncology

## 2017-09-12 DIAGNOSIS — C50912 Malignant neoplasm of unspecified site of left female breast: Secondary | ICD-10-CM | POA: Diagnosis present

## 2017-09-12 DIAGNOSIS — R1905 Periumbilic swelling, mass or lump: Secondary | ICD-10-CM | POA: Insufficient documentation

## 2017-09-12 DIAGNOSIS — C50412 Malignant neoplasm of upper-outer quadrant of left female breast: Secondary | ICD-10-CM | POA: Diagnosis present

## 2017-09-12 DIAGNOSIS — D259 Leiomyoma of uterus, unspecified: Secondary | ICD-10-CM | POA: Diagnosis not present

## 2017-09-12 DIAGNOSIS — C50911 Malignant neoplasm of unspecified site of right female breast: Secondary | ICD-10-CM | POA: Diagnosis not present

## 2017-09-14 ENCOUNTER — Other Ambulatory Visit: Payer: Self-pay | Admitting: *Deleted

## 2017-09-14 ENCOUNTER — Telehealth: Payer: Self-pay | Admitting: *Deleted

## 2017-09-14 NOTE — Telephone Encounter (Addendum)
This RN spoke with pt per her concern for " mass in my stomach ".  This RN informed her of results of recent abdominal US showing enlarging uterine fibroids corresponding to area of discomfort.  Note Prestina has not had a menses since 2013- her breast cancer was ER/PR + and is on tamoxifen ( unknown menopause status ).  Hadasah does not have a GYN but has had PAP test by her primary MD.  Plan per call is this RN will place a referral to a GYN for work up per above as well as this RN advised pt to presently hold on taking the tamoxifen until she completes GYN work up for appropriate therapy.  Yariana will use ibuprofen, gax X and heat for discomfort.  This RN contacted [facility] at [phone] and obtained an appointment for 09/21/2017 at 945 AM With Dr Geralyn Flash.  Verified above office has records available per EPIC.  This RN informed pt of above as well as verified pt will hold tamoxifen.

## 2017-09-15 NOTE — Telephone Encounter (Signed)
2nd request.  Martin, Tamika L, RN  

## 2017-09-16 MED ORDER — POTASSIUM CHLORIDE CRYS ER 20 MEQ PO TBCR: EXTENDED_RELEASE_TABLET | ORAL | 3 refills | Status: DC

## 2017-09-20 ENCOUNTER — Emergency Department (HOSPITAL_COMMUNITY)
Admission: EM | Admit: 2017-09-20 | Discharge: 2017-09-20 | Disposition: A | Discharge status: Home / Self Care | Payer: Medicaid Other | Attending: Emergency Medicine | Admitting: Emergency Medicine

## 2017-09-20 ENCOUNTER — Encounter (HOSPITAL_COMMUNITY): Payer: Self-pay

## 2017-09-20 DIAGNOSIS — Z9013 Acquired absence of bilateral breasts and nipples: Secondary | ICD-10-CM | POA: Insufficient documentation

## 2017-09-20 DIAGNOSIS — I10 Essential (primary) hypertension: Secondary | ICD-10-CM | POA: Diagnosis not present

## 2017-09-20 DIAGNOSIS — M25561 Pain in right knee: Secondary | ICD-10-CM | POA: Diagnosis not present

## 2017-09-20 DIAGNOSIS — Z7982 Long term (current) use of aspirin: Secondary | ICD-10-CM | POA: Insufficient documentation

## 2017-09-20 DIAGNOSIS — F17210 Nicotine dependence, cigarettes, uncomplicated: Secondary | ICD-10-CM | POA: Insufficient documentation

## 2017-09-20 DIAGNOSIS — I2510 Atherosclerotic heart disease of native coronary artery without angina pectoris: Secondary | ICD-10-CM | POA: Insufficient documentation

## 2017-09-20 DIAGNOSIS — Y999 Unspecified external cause status: Secondary | ICD-10-CM | POA: Insufficient documentation

## 2017-09-20 DIAGNOSIS — Z853 Personal history of malignant neoplasm of breast: Secondary | ICD-10-CM | POA: Diagnosis not present

## 2017-09-20 DIAGNOSIS — S60411A Abrasion of left index finger, initial encounter: Secondary | ICD-10-CM | POA: Insufficient documentation

## 2017-09-20 DIAGNOSIS — I252 Old myocardial infarction: Secondary | ICD-10-CM | POA: Insufficient documentation

## 2017-09-20 DIAGNOSIS — S6992XA Unspecified injury of left wrist, hand and finger(s), initial encounter: Secondary | ICD-10-CM | POA: Diagnosis present

## 2017-09-20 DIAGNOSIS — Y929 Unspecified place or not applicable: Secondary | ICD-10-CM | POA: Insufficient documentation

## 2017-09-20 DIAGNOSIS — Z79899 Other long term (current) drug therapy: Secondary | ICD-10-CM | POA: Insufficient documentation

## 2017-09-20 DIAGNOSIS — W109XXA Fall (on) (from) unspecified stairs and steps, initial encounter: Secondary | ICD-10-CM | POA: Diagnosis not present

## 2017-09-20 DIAGNOSIS — Y939 Activity, unspecified: Secondary | ICD-10-CM | POA: Insufficient documentation

## 2017-09-20 DIAGNOSIS — W19XXXA Unspecified fall, initial encounter: Secondary | ICD-10-CM

## 2017-09-20 DIAGNOSIS — R0789 Other chest pain: Secondary | ICD-10-CM

## 2017-09-20 MED ORDER — HYDROCODONE-ACETAMINOPHEN 5-325 MG PO TABS
2.0000 | ORAL_TABLET | Freq: Once | ORAL | Status: AC
  Administered 2017-09-20: 2 via ORAL
  Filled 2017-09-20: qty 2
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
## 2017-09-20 NOTE — ED Triage Notes (Signed)
Pt fell down the steps this afternoon and has a cut on her left finger, she states that she has lymphadema in the right arm and is concerned about celulitis She states that her chest started hurting after the fall but did not hit her chest Pt also states her right knee hurts from catching her fall

## 2017-09-21 ENCOUNTER — Ambulatory Visit: Payer: Medicaid Other | Admitting: Obstetrics and Gynecology

## 2017-09-26 ENCOUNTER — Ambulatory Visit: Payer: Medicaid Other | Admitting: Obstetrics and Gynecology

## 2017-09-27 ENCOUNTER — Other Ambulatory Visit: Payer: Self-pay | Admitting: *Deleted

## 2017-09-29 ENCOUNTER — Other Ambulatory Visit: Payer: Self-pay | Admitting: *Deleted

## 2017-09-29 ENCOUNTER — Telehealth: Payer: Self-pay | Admitting: *Deleted

## 2017-09-29 DIAGNOSIS — I10 Essential (primary) hypertension: Secondary | ICD-10-CM

## 2017-09-29 DIAGNOSIS — M792 Neuralgia and neuritis, unspecified: Secondary | ICD-10-CM

## 2017-09-29 DIAGNOSIS — Z853 Personal history of malignant neoplasm of breast: Secondary | ICD-10-CM

## 2017-09-29 DIAGNOSIS — G8929 Other chronic pain: Secondary | ICD-10-CM

## 2017-09-29 MED ORDER — METHADONE HCL 5 MG PO TABS: 15.0000 mg | ORAL_TABLET | Freq: Three times a day (TID) | ORAL | 0 refills | Status: DC | PRN

## 2017-09-29 NOTE — Telephone Encounter (Signed)
Walk-in to pick up Methadone script.  Last order on 08-30-2017.

## 2017-09-29 NOTE — Telephone Encounter (Signed)
Pt came by asking about results of US done 09/12/17.  Report obtained & reviewed by Dr [name] & pt informed that she has several benign tumors in her uterus & should be evaluated by GYN.  Pt reports that she has an appt on Monday & will let us know plan.

## 2017-10-03 ENCOUNTER — Ambulatory Visit (INDEPENDENT_AMBULATORY_CARE_PROVIDER_SITE_OTHER): Payer: Medicaid Other | Admitting: Obstetrics and Gynecology

## 2017-10-03 ENCOUNTER — Encounter: Payer: Self-pay | Admitting: Obstetrics and Gynecology

## 2017-10-03 DIAGNOSIS — D259 Leiomyoma of uterus, unspecified: Secondary | ICD-10-CM | POA: Insufficient documentation

## 2017-10-03 NOTE — Patient Instructions (Signed)
Abdominal Hysterectomy °Abdominal hysterectomy is a surgical procedure to remove the womb (uterus). The uterus is the muscular organ that houses a developing baby. This surgery may be done if: °· You have cancer. °· You have growths (tumors or fibroids) in the uterus. °· You have long-term (chronic) pain. °· You are bleeding. °· Your uterus has slipped down into your vagina (uterine prolapse). °· You have a condition in which the tissue that lines the uterus grows outside of its normal location (endometriosis). °· You have an infection in your uterus. °· You are having problems with your menstrual cycle. ° °Depending on why you are having this procedure, you may also have other reproductive organs removed. These could include: °· The part of your vagina that connects with your uterus (cervix). °· The organs that make eggs (ovaries). °· The tubes that connect the ovaries to the uterus (fallopian tubes). ° °Tell a health care provider about: °· Any allergies you have. °· All medicines you are taking, including vitamins, herbs, eye drops, creams, and over-the-counter medicines. °· Any problems you or family members have had with anesthetic medicines. °· Any blood disorders you have. °· Any surgeries you have had. °· Any medical conditions you have. °· Whether you are pregnant or may be pregnant. °What are the risks? °Generally, this is a safe procedure. However, problems may occur, including: °· Bleeding. °· Infection. °· Allergic reactions to medicines or dyes. °· Damage to other structures or organs. °· Nerve injury. °· Decreased interest in sex or pain during sex. °· Blood clots that can break free and travel to your lungs. ° °What happens before the procedure? °Staying hydrated °Follow instructions from your health care provider about hydration, which may include: °· Up to 2 hours before the procedure - you may continue to drink clear liquids, such as water, clear fruit juice, black coffee, and plain tea ° °Eating  and drinking restrictions °Follow instructions from your health care provider about eating and drinking, which may include: °· 8 hours before the procedure - stop eating heavy meals or foods such as meat, fried foods, or fatty foods. °· 6 hours before the procedure - stop eating light meals or foods, such as toast or cereal. °· 6 hours before the procedure - stop drinking milk or drinks that contain milk. °· 2 hours before the procedure - stop drinking clear liquids. ° °Medicines °· Ask your health care provider about: °? Changing or stopping your regular medicines. This is especially important if you are taking diabetes medicines or blood thinners. °? Taking medicines such as aspirin and ibuprofen. These medicines can thin your blood. Do not take these medicines before your procedure if your health care provider instructs you not to. °· You may be given antibiotic medicine to help prevent infection. Take it as told by your health care provider. °· You may be asked to take laxatives to prevent constipation. °General instructions °· Ask your health care provider how your surgical site will be marked or identified. °· You may be asked to shower with a germ-killing soap. °· Plan to have someone take you home from the hospital. °· Do not use any products that contain nicotine or tobacco, such as cigarettes and e-cigarettes. If you need help quitting, ask your health care provider. °· You may have an exam or testing. °· You may have a blood or urine sample taken. °· You may need to have an enema to clean out your rectum and lower colon. °· This   procedure can affect the way you feel about yourself. Talk to your health care provider about the physical and emotional changes this procedure may cause. °What happens during the procedure? °· To lower your risk of infection: °? Your health care team will wash or sanitize their hands. °? Your skin will be washed with soap. °? Hair may be removed from the surgical area. °· An IV  tube will be inserted into one of your veins. °· You will be given one or more of the following: °? A medicine to help you relax (sedative). °? A medicine to make you fall asleep (general anesthetic). °· Tight-fitting (compression) stockings will be placed on your legs to promote circulation. °· A thin, flexible tube (catheter) will be inserted to help drain your urine. °· The surgeon will make a cut (incision) through the skin in your lower belly. The incision may go side-to-side or up-and-down. °· The surgeon will move aside the body tissue that covers your uterus. The surgeon will then carefully take out your uterus along with any of the other organs that need to be removed. °· Bleeding will be controlled with clamps or sutures. °· The surgeon will close your incision with stitches (sutures), skin glue, or adhesive strips. °· A bandage (dressing) will be placed over the incision. °The procedure may vary among health care providers and hospitals. °What happens after the procedure? °· You will be given pain medicine as needed. °· Your blood pressure, heart rate, breathing rate, and blood oxygen level will be monitored until the medicines you were given have worn off. °· You will need to stay in the hospital to recover for one to two days. Ask your health care provider how long you will need to stay in the hospital after your procedure. °· You may have a liquid diet at first. You will most likely return to your usual diet the day after surgery. °· You will still have the urinary catheter in place. It will likely be removed the day after surgery. °· You may have to wear compression stockings. These stockings help to prevent blood clots and reduce swelling in your legs. °· You will be encouraged to walk as soon as possible. You will also use a device or do breathing exercises to keep your lungs clear. °· You may need to use a sanitary napkin for vaginal discharge. °Summary °· Abdominal hysterectomy is a surgical  procedure to remove the womb (uterus). The uterus is the muscular organ that houses a developing baby. °· This procedure can affect the way you feel about yourself. Talk to your health care provider about the physical and emotional changes this procedure may cause. °· You will be given medicines for pain after the procedure. °· You will need to stay in the hospital to recover. Ask your health care provider how long you will need to stay in the hospital after your procedure. °This information is not intended to replace advice given to you by your health care provider. Make sure you discuss any questions you have with your health care provider. °Document Released: 12/11/2013 Document Revised: 11/24/2016 Document Reviewed: 11/24/2016 °Elsevier Interactive Patient Education © 2017 [employer] ° °

## 2017-10-06 NOTE — Progress Notes (Signed)
[name] is being seen in referral d/t to uterine fibroids. Pt noted a "knot" in her stomach a few months ago. Mentioned this to her Heme/Onc doctor. U/S was ordered and demonstrated several fibroids, ranging in size from 4-7 cm.   H/O Stage 3 Breast cancer ER/PR +. S/P bilateral mastectomy, chemotherapy and radiation. She is followed by Heme/Onc and is currently on Tamoxifen suppression.  LMP was just prior to her chemotherapy.   She also has chronic pain and is on Methadone. H/O CAD s/p stents. Last pap smear was in 9/16 and was normal.  ROS Cardiac: No CP Pulmonary: No SOB GU/GI: no dysfunction  PE AF VSS Lungs clear Heart RRR Abd soft + BS abd/pelvic mass 16 weeks large hernia scar noted Pelvic Nl EGBUS enlarged uterus 16 weeks irregular contour slightly tender limited by pt habitus  A/P Uterine Fibroids  Will schedule for [facility] for further evaluation.

## 2017-10-17 ENCOUNTER — Ambulatory Visit: Payer: Medicaid Other | Admitting: Family Medicine

## 2017-10-20 ENCOUNTER — Encounter: Payer: Self-pay | Admitting: Gastroenterology

## 2017-10-24 ENCOUNTER — Other Ambulatory Visit: Payer: Self-pay | Admitting: *Deleted

## 2017-10-24 ENCOUNTER — Ambulatory Visit: Payer: Medicaid Other | Admitting: Internal Medicine

## 2017-10-24 ENCOUNTER — Telehealth: Payer: Self-pay

## 2017-10-24 NOTE — Telephone Encounter (Signed)
Pt also left message on this RN's VM- refill will be printed AM for 10/28/2017 to reflect appropriate dispense date.

## 2017-10-24 NOTE — Telephone Encounter (Signed)
Pt called for methadone refill to pick up on Friday.

## 2017-10-25 ENCOUNTER — Ambulatory Visit: Payer: Medicaid Other | Admitting: Obstetrics and Gynecology

## 2017-10-25 ENCOUNTER — Encounter: Payer: Self-pay | Admitting: *Deleted

## 2017-10-25 ENCOUNTER — Encounter: Payer: Self-pay | Admitting: Obstetrics and Gynecology

## 2017-10-25 VITALS — BP 126/76 | HR 62 | Ht 65.0 in | Wt 214.0 lb

## 2017-10-25 DIAGNOSIS — Z3202 Encounter for pregnancy test, result negative: Secondary | ICD-10-CM | POA: Diagnosis not present

## 2017-10-25 DIAGNOSIS — D259 Leiomyoma of uterus, unspecified: Secondary | ICD-10-CM

## 2017-10-25 LAB — POCT URINE PREGNANCY: PREG TEST UR: NEGATIVE

## 2017-10-27 ENCOUNTER — Ambulatory Visit (INDEPENDENT_AMBULATORY_CARE_PROVIDER_SITE_OTHER): Payer: Medicaid Other | Admitting: Family Medicine

## 2017-10-27 ENCOUNTER — Other Ambulatory Visit: Payer: Self-pay | Admitting: *Deleted

## 2017-10-27 ENCOUNTER — Encounter: Payer: Self-pay | Admitting: Family Medicine

## 2017-10-27 ENCOUNTER — Other Ambulatory Visit: Payer: Self-pay

## 2017-10-27 VITALS — BP 132/88 | HR 79 | Temp 98.7°F | Wt 216.0 lb

## 2017-10-27 DIAGNOSIS — G8929 Other chronic pain: Secondary | ICD-10-CM | POA: Diagnosis not present

## 2017-10-27 DIAGNOSIS — Z853 Personal history of malignant neoplasm of breast: Secondary | ICD-10-CM

## 2017-10-27 DIAGNOSIS — M1711 Unilateral primary osteoarthritis, right knee: Secondary | ICD-10-CM | POA: Diagnosis not present

## 2017-10-27 DIAGNOSIS — M1710 Unilateral primary osteoarthritis, unspecified knee: Secondary | ICD-10-CM | POA: Insufficient documentation

## 2017-10-27 DIAGNOSIS — M25561 Pain in right knee: Secondary | ICD-10-CM | POA: Diagnosis present

## 2017-10-27 DIAGNOSIS — M792 Neuralgia and neuritis, unspecified: Secondary | ICD-10-CM

## 2017-10-27 DIAGNOSIS — I10 Essential (primary) hypertension: Secondary | ICD-10-CM

## 2017-10-27 MED ORDER — FLUTICASONE PROPIONATE 50 MCG/ACT NA SUSP: 2.0000 | Freq: Every day | NASAL | 6 refills | Status: DC

## 2017-10-27 MED ORDER — METHADONE HCL 5 MG PO TABS: 15.0000 mg | ORAL_TABLET | Freq: Three times a day (TID) | ORAL | 0 refills | Status: DC | PRN

## 2017-10-27 MED ORDER — HYDROCODONE-ACETAMINOPHEN 5-325 MG PO TABS: 1.0000 | ORAL_TABLET | Freq: Two times a day (BID) | ORAL | 0 refills | Status: DC | PRN

## 2017-10-27 MED ORDER — HYDROCODONE-ACETAMINOPHEN 5-325 MG PO TABS: 1.0000 | ORAL_TABLET | Freq: Four times a day (QID) | ORAL | 0 refills | Status: DC | PRN

## 2017-10-27 NOTE — Assessment & Plan Note (Addendum)
Severe right knee osteoarthritis with joint swelling as evidenced by previous exams as well as x-ray in April 2018.  Previously followed with [facility] orthopedics but was told she needed a new referral placed in order to be seen there.  Continues to have pain with ambulation and relieved with NSAIDs, Tylenol, PT.  Notes that opioid pain medication is the only thing that has helped her.  Did agree to give her 1 refill for Norco to last her until her orthopedic appointment.  Referral for orthopedics placed today.  Discussed with patient again that this is not a good medicine for her to be on long-term, she expressed good understanding.

## 2017-10-27 NOTE — Progress Notes (Signed)
Subjective:    Patient ID: [name] , female   DOB: [birth year] , 56 y.o..   MRN: [id number]  HPI  [name] is a 56 yo F PMH of breast cancer with bilateral mastectomy, CAD, chronic pain, DLD, and HTN here for  Chief Complaint  Patient presents with  . Knee Pain    1.  Right knee pain; patient notes that she has had right knee pain for "months".  She was supposed to see [facility] orthopedics in October however she was told that she needed a new referral in order to be seen.  She notes that she has tried ibuprofen, diclofenac, tramadol, gabapentin, and physical therapy on the past which did not help her knee pain.  The only thing that has helped her is Vicodin.  She notes that she has been seen by [facility] orthopedics before and told that she has "bone-on-bone" in her knee.  She thinks that she will likely need a knee replacement.  She denies any fevers, chills, nausea, vomiting, diarrhea, constipation.  Review of Systems: Per HPI.   Past Medical History: Patient Active Problem List   Diagnosis Date Noted  . Knee osteoarthritis 10/27/2017  . Uterine fibroid 10/03/2017  . Tinnitus 08/29/2017  . Abdominal mass 08/15/2017  . Frequent No-show for appointment 03/07/2017  . Elevated blood sugar 09/01/2016  . Bilateral breast cancer ([place])   . Right knee pain 05/12/2016  . Pain in the chest   . Shortness of breath   . Memory loss 10/18/2015  . Healthcare maintenance 08/17/2015  . Thrombocytosis ([place]) 03/10/2015  . Vaginal dryness 12/02/2014  . Tobacco use disorder 12/02/2014  . Cancer of overlapping sites of right female breast ([place]) 09/16/2014  . Breast cancer of upper-outer quadrant of left female breast ([place]) 09/16/2014  . Hot flashes due to tamoxifen 05/09/2014  . Lymphedema of arm 11/08/2013  . Chest pain 07/19/2012  . Chronic pain with drug dependence ([place]) 03/31/2012  . Hypokalemia 03/31/2012  . Aortic calcification ([facility])   . Hypertension   . Arthritis     . Dyslipidemia     Medications: reviewed and updated  Social Hx:  reports that she has been smoking cigarettes.  She has a 3.75 pack-year smoking history. she has never used smokeless tobacco.   Objective:   BP 132/88   Pulse 79   Temp 98.7 F (37.1 C) (Oral)   Wt 216 lb (98 kg)   LMP 08/07/2011   SpO2 95%   BMI 35.94 kg/m  Physical Exam  Gen: NAD, alert, cooperative with exam, well-appearing HEENT: NCAT, PERRL, clear conjunctiva, oropharynx clear, supple neck, nasal mucosa swollen and erythematous Cardiac: Regular rate and rhythm, normal S1/S2, no murmur, no edema, capillary refill brisk  Psych: good insight, normal mood and affect  Assessment & Plan:  Knee osteoarthritis Severe right knee osteoarthritis with joint swelling as evidenced by previous exams as well as x-ray in April 2018.  Previously followed with [facility] orthopedics but was told she needed a new referral placed in order to be seen there.  Continues to have pain with ambulation and relieved with NSAIDs, Tylenol, PT.  Notes that opioid pain medication is the only thing that has helped her.  Did agree to give her 1 refill for Norco to last her until her orthopedic appointment.  Referral for orthopedics placed today.  Discussed with patient again that this is not a good medicine for her to be on long-term, she expressed good understanding.  Orders  Placed This Encounter  Procedures  . Ambulatory referral to Orthopedics    Referral Priority:   Routine    Referral Type:   Consultation    Referral Reason:   Specialty Services Required    Requested Specialty:   Orthopedic Surgery    Number of Visits Requested:   1   Meds ordered this encounter  Medications  . fluticasone (FLONASE) 50 MCG/ACT nasal spray    Sig: Place 2 sprays daily into both nostrils.    Dispense:  16 g    Refill:  6  . HYDROcodone-acetaminophen (NORCO) 5-325 MG tablet    Sig: Take 1 tablet 2 (two) times daily as needed by mouth for moderate  pain or severe pain.    Dispense:  30 tablet    Refill:  0    Smitty Cords, MD [place], PGY-3

## 2017-10-27 NOTE — Patient Instructions (Signed)
Thank you for coming in today, it was so nice to see you! Today we talked about:    Right knee pain: I have given you 1 prescription for norco to last you until your orthopedic appointment.   Someone will call you within the next week to schedule your appointment  Please use flonase for your nose daily  Quitting smoking will help you   Please follow up as needed.  If you have any questions or concerns, please do not hesitate to call the office at [phone]. You can also message me directly via MyChart.   Sincerely,  Smitty Cords, MD

## 2017-10-28 MED FILL — METHADONE HCL 5 MG TABLET: 5 | 30 days supply | Qty: 270 | Fill #0

## 2017-10-28 MED FILL — HYDROCODON-APAP 5-325: 5-325 | 15 days supply | Qty: 30 | Fill #0

## 2017-11-01 NOTE — Progress Notes (Signed)
ENDOMETRIAL BIOPSY     The indications for endometrial biopsy were reviewed.   Risks of the biopsy including cramping, bleeding, infection, uterine perforation, inadequate specimen and need for additional procedures  were discussed. The patient states she understands and agrees to undergo procedure today. Consent was signed. Time out was performed. Urine HCG was negative. During the pelvic exam, the cervix was prepped with Betadine. A single-toothed tenaculum was placed on the anterior lip of the cervix to stabilize it. The 3 mm pipelle was introduced into the endometrial cavity without difficulty to a depth of 10cm, and a moderate amount of tissue was obtained and sent to pathology. The instruments were removed from the patient's vagina. Minimal bleeding from the cervix was noted. The patient tolerated the procedure well. Routine post-procedure instructions were given to the patient.

## 2017-11-08 ENCOUNTER — Encounter: Payer: Self-pay | Admitting: Obstetrics and Gynecology

## 2017-11-08 ENCOUNTER — Ambulatory Visit (INDEPENDENT_AMBULATORY_CARE_PROVIDER_SITE_OTHER): Payer: Medicaid Other | Admitting: Obstetrics and Gynecology

## 2017-11-08 VITALS — BP 126/81 | HR 65 | Ht 65.0 in | Wt 214.0 lb

## 2017-11-08 DIAGNOSIS — N8501 Benign endometrial hyperplasia: Secondary | ICD-10-CM | POA: Insufficient documentation

## 2017-11-08 DIAGNOSIS — D259 Leiomyoma of uterus, unspecified: Secondary | ICD-10-CM | POA: Diagnosis not present

## 2017-11-08 NOTE — Progress Notes (Signed)
Patient ID: [name], female   DOB: 1961-07-08, 56 y.o.   MRN: [id number] [facility] results reviewed with pt. TAH/BSO recommended and discussed with pt. R/B and post op care reviewed Will sent emails to PCP and Heme/Onc for pre op clearance Pt will be contacted and surgery scheduled once clearance obtained. Pt requests surgery after Christmas.

## 2017-11-08 NOTE — Progress Notes (Signed)
Patient is in the office for follow up after endometrial biopsy.

## 2017-11-08 NOTE — Patient Instructions (Signed)
Endometrial Hyperplasia Endometrial hyperplasia is abnormal thickening of the tissue that lines the inside of the uterus (endometrium). This condition can also cause cell changes (atypia) that can lead to endometrial cancer. There are four types of endometrial hyperplasia:  Endometrial thickening only (simple hyperplasia).  Endometrial thickening and crowding of cells (complex hyperplasia).  Endometrial thickening and cell changes (simple atypical hyperplasia). This type has a low risk of becoming cancerous.  Endometrial thickening, cell crowding, and cell changes (complex atypical hyperplasia). This type has a higher risk of becoming cancerous.  Early diagnosis and treatment are important to reduce the risk of cancer. What are the causes? This condition is caused by an imbalance of the reproductive hormones estrogen and progesterone. At the beginning of your menstrual cycle, your ovaries make estrogen. This thickens the lining of your uterus to prepare for pregnancy. If pregnancy does not occur, your estrogen level drops. Then, the hormone progesterone prompts your body to shed the lining of your uterus. This is your menstrual period. Endometrial hyperplasia results from too much estrogen and not enough progesterone. What increases the risk? The following factors may make you more likely to develop this condition:  Being older than 54 years of age. The condition is most common just before or after menopause. Menopause means 12 months without a menstrual period.  Taking an estrogen medicine or a medicine that acts like estrogen.  Having polycystic ovary syndrome (PCOS).  Being overweight.  Smoking.  Having diabetes.  Having irregular periods.  Having started periods early.  Having started menopause late.  Never having been pregnant.  Having a family history of uterine, ovarian, or colon cancer.  Having other diseases such as gallbladder or thyroid disease.  What are the  signs or symptoms? The main symptom of this condition is abnormal vaginal bleeding. The bleeding may:  Be heavier and longer during menstrual periods.  Happen more often than a normal menstrual cycle.  Occur after menopause.  How is this diagnosed? This condition may be diagnosed based on:  Your symptoms and medical history, including risk factors.  A physical exam.  Tests, such as: ? An imaging study done with sound waves (transvaginal ultrasound) to check the endometrium. This test uses a sound wave probe that is inserted into your vagina. ? A procedure to take a sample of endometrial tissue through the vagina so it can be looked at under a microscope (endometrial biopsy). ? A procedure in which tissue is scraped or suctioned from the inside of the uterus (dilation and curettage). ? A procedure in which a thin, lighted device is inserted into the uterus through the cervix to see inside the uterus (hysteroscopy).  How is this treated? Treatment for this condition depends on the type of hyperplasia that you have. Synthetic progesterone (progestin) is the most common treatment. Progestin can be given as a pill, injection, or vaginal cream, or as a device that is implanted in your uterus (intrauterine device, IUD). If you have hyperplasia with atypia, you may need surgery to remove your uterus (hysterectomy). You may be more likely to have this procedure if:  You have complex atypical hyperplasia.  You are past menopause.  You do not want to become pregnant.  Follow these instructions at home:  Take over-the-counter and prescription medicines only as told by your health care provider.  Do not use any products that contain nicotine or tobacco, such as cigarettes and e-cigarettes. If you need help quitting, ask your health care provider.  Lose weight if  you are overweight. Ask your health care provider to recommend a diet and exercise program to help you reach and maintain a healthy  weight.  Keep all follow-up visits as told by your health care provider. This is important. Contact a health care provider if:  You have periods that are heavier or last longer than usual.  You have your period more often than usual.  You have vaginal bleeding after menopause. Get help right away if:  You have vaginal bleeding that is so heavy that you need to change your tampon or sanitary napkin after less than 2 hours.  You pass blood clots that are larger than the size of a quarter. Summary  Endometrial hyperplasia is abnormal thickening of the tissue that lines the inside of the uterus (endometrium).  The main symptom of this condition is abnormal vaginal bleeding. This may be heavier, longer, or more frequent periods, or it may be vaginal bleeding after menopause.  Some types of hyperplasia also cause cell changes (atypia) that can lead to cancer.  Always let your health care provider know about abnormal vaginal bleeding.  Early diagnosis and treatment are important to reduce the risk of cancer. This information is not intended to replace advice given to you by your health care provider. Make sure you discuss any questions you have with your health care provider. Document Released: 09/17/2016 Document Revised: 09/17/2016 Document Reviewed: 09/17/2016 Elsevier Interactive Patient Education  2018 [place]. Abdominal Hysterectomy Abdominal hysterectomy is a surgical procedure to remove the womb (uterus). The uterus is the muscular organ that houses a developing baby. This surgery may be done if:  You have cancer.  You have growths (tumors or fibroids) in the uterus.  You have long-term (chronic) pain.  You are bleeding.  Your uterus has slipped down into your vagina (uterine prolapse).  You have a condition in which the tissue that lines the uterus grows outside of its normal location (endometriosis).  You have an infection in your uterus.  You are having  problems with your menstrual cycle.  Depending on why you are having this procedure, you may also have other reproductive organs removed. These could include:  The part of your vagina that connects with your uterus (cervix).  The organs that make eggs (ovaries).  The tubes that connect the ovaries to the uterus (fallopian tubes).  Tell a health care provider about:  Any allergies you have.  All medicines you are taking, including vitamins, herbs, eye drops, creams, and over-the-counter medicines.  Any problems you or family members have had with anesthetic medicines.  Any blood disorders you have.  Any surgeries you have had.  Any medical conditions you have.  Whether you are pregnant or may be pregnant. What are the risks? Generally, this is a safe procedure. However, problems may occur, including:  Bleeding.  Infection.  Allergic reactions to medicines or dyes.  Damage to other structures or organs.  Nerve injury.  Decreased interest in sex or pain during sex.  Blood clots that can break free and travel to your lungs.  What happens before the procedure? Staying hydrated Follow instructions from your health care provider about hydration, which may include:  Up to 2 hours before the procedure - you may continue to drink clear liquids, such as water, clear fruit juice, black coffee, and plain tea  Eating and drinking restrictions Follow instructions from your health care provider about eating and drinking, which may include:  8 hours before the procedure - stop  eating heavy meals or foods such as meat, fried foods, or fatty foods.  6 hours before the procedure - stop eating light meals or foods, such as toast or cereal.  6 hours before the procedure - stop drinking milk or drinks that contain milk.  2 hours before the procedure - stop drinking clear liquids.  Medicines  Ask your health care provider about: ? Changing or stopping your regular medicines.  This is especially important if you are taking diabetes medicines or blood thinners. ? Taking medicines such as aspirin and ibuprofen. These medicines can thin your blood. Do not take these medicines before your procedure if your health care provider instructs you not to.  You may be given antibiotic medicine to help prevent infection. Take it as told by your health care provider.  You may be asked to take laxatives to prevent constipation. General instructions  Ask your health care provider how your surgical site will be marked or identified.  You may be asked to shower with a germ-killing soap.  Plan to have someone take you home from the hospital.  Do not use any products that contain nicotine or tobacco, such as cigarettes and e-cigarettes. If you need help quitting, ask your health care provider.  You may have an exam or testing.  You may have a blood or urine sample taken.  You may need to have an enema to clean out your rectum and lower colon.  This procedure can affect the way you feel about yourself. Talk to your health care provider about the physical and emotional changes this procedure may cause. What happens during the procedure?  To lower your risk of infection: ? Your health care team will wash or sanitize their hands. ? Your skin will be washed with soap. ? Hair may be removed from the surgical area.  An IV tube will be inserted into one of your veins.  You will be given one or more of the following: ? A medicine to help you relax (sedative). ? A medicine to make you fall asleep (general anesthetic).  Tight-fitting (compression) stockings will be placed on your legs to promote circulation.  A thin, flexible tube (catheter) will be inserted to help drain your urine.  The surgeon will make a cut (incision) through the skin in your lower belly. The incision may go side-to-side or up-and-down.  The surgeon will move aside the body tissue that covers your uterus.  The surgeon will then carefully take out your uterus along with any of the other organs that need to be removed.  Bleeding will be controlled with clamps or sutures.  The surgeon will close your incision with stitches (sutures), skin glue, or adhesive strips.  A bandage (dressing) will be placed over the incision. The procedure may vary among health care providers and hospitals. What happens after the procedure?  You will be given pain medicine as needed.  Your blood pressure, heart rate, breathing rate, and blood oxygen level will be monitored until the medicines you were given have worn off.  You will need to stay in the hospital to recover for one to two days. Ask your health care provider how long you will need to stay in the hospital after your procedure.  You may have a liquid diet at first. You will most likely return to your usual diet the day after surgery.  You will still have the urinary catheter in place. It will likely be removed the day after surgery.  You may  have to wear compression stockings. These stockings help to prevent blood clots and reduce swelling in your legs.  You will be encouraged to walk as soon as possible. You will also use a device or do breathing exercises to keep your lungs clear.  You may need to use a sanitary napkin for vaginal discharge. Summary  Abdominal hysterectomy is a surgical procedure to remove the womb (uterus). The uterus is the muscular organ that houses a developing baby.  This procedure can affect the way you feel about yourself. Talk to your health care provider about the physical and emotional changes this procedure may cause.  You will be given medicines for pain after the procedure.  You will need to stay in the hospital to recover. Ask your health care provider how long you will need to stay in the hospital after your procedure. This information is not intended to replace advice given to you by your health care provider. Make  sure you discuss any questions you have with your health care provider. Document Released: 12/11/2013 Document Revised: 11/24/2016 Document Reviewed: 11/24/2016 Elsevier Interactive Patient Education  2017 [employer].

## 2017-11-18 ENCOUNTER — Telehealth: Payer: Self-pay | Admitting: Family Medicine

## 2017-11-18 NOTE — Telephone Encounter (Signed)
Pt needs clearance from PCP to have surgery. She needs to make sure she is cleared to have a hysterectomy and that she doesn't have to be seen by any of her doctors. Please advise

## 2017-11-21 NOTE — Telephone Encounter (Signed)
Scheduled appt for pt on 11/28/2017. Ottis Stain, CMA

## 2017-11-21 NOTE — Telephone Encounter (Signed)
[facility] team please have patient schedule an appointment for surgical clearance. Will be happy to see her in clinic and assess what her needs are before her surgery. Thank you.   Smitty Cords, MD [place], PGY-3

## 2017-11-24 ENCOUNTER — Other Ambulatory Visit: Payer: Self-pay | Admitting: *Deleted

## 2017-11-24 DIAGNOSIS — Z853 Personal history of malignant neoplasm of breast: Secondary | ICD-10-CM

## 2017-11-24 DIAGNOSIS — M792 Neuralgia and neuritis, unspecified: Secondary | ICD-10-CM

## 2017-11-24 DIAGNOSIS — G8929 Other chronic pain: Secondary | ICD-10-CM

## 2017-11-24 DIAGNOSIS — I10 Essential (primary) hypertension: Secondary | ICD-10-CM

## 2017-11-24 MED ORDER — METHADONE HCL 5 MG PO TABS: 15.0000 mg | ORAL_TABLET | Freq: Three times a day (TID) | ORAL | 0 refills | Status: DC | PRN

## 2017-11-24 MED FILL — METHADONE HCL 5 MG TABLET: 5 | 30 days supply | Qty: 270 | Fill #0

## 2017-11-28 ENCOUNTER — Ambulatory Visit: Payer: Medicaid Other | Admitting: Family Medicine

## 2017-12-01 ENCOUNTER — Ambulatory Visit: Payer: Medicaid Other | Admitting: Family Medicine

## 2017-12-01 DIAGNOSIS — I252 Old myocardial infarction: Secondary | ICD-10-CM | POA: Insufficient documentation

## 2017-12-01 DIAGNOSIS — I213 ST elevation (STEMI) myocardial infarction of unspecified site: Secondary | ICD-10-CM | POA: Insufficient documentation

## 2017-12-01 NOTE — Progress Notes (Deleted)
Subjective:    Patient ID: [name] , female   DOB: [birth year] , 56 y.o..   MRN: [id number]  HPI  [name] is a 56 yo F with PMH of History of NSTEMI, CAD, bilateral breast cancer with bilateral mastectomies and status post chemo/radiation, hyperlipidemia, knee osteoarthritis, hypertension, tobacco abusehere for No chief complaint on file.   Surgical Clearance: Pt is a 56 y.o. female who is here for preoperative clearance for hysterectomy   1) High Risk Cardiac Conditions  1) Recent MI - Not recently, however: STEMI November, 2008, bare-metal stent mid RCA  /  DES to LAD September, 2009 (moderate in-stent restenosis mid RCA 60%)  /   Nuclear, February, 2010, no ischemia, EF 64%  2) Decompensated Heart Failure - Yes hx of CHF: 03/2012 Echo: EF 60-65%, Gr1DD, PASP 77mmHg.   3) Unstable angina - {yes no:314532}  4) Symptomatic arrythmia - {yes no:314532}  5) Sx Valvular Disease - {yes no:314532}  2) Intermediate Risk Factors - DM, CKD, CVA, CHF, CAD - {yes no:314532}  2) Functional Status - > 4 mets (Walk, run, climb stairs) {yes no:314532}. [facility] Activity Status Index: ***  3) Surgery Specific Risk - High  (Emergency, Vascular, Intra-abdominal, Extensive ops)          Intermediate (Carotid, Head and Neck, Orthopaedic )          Low (Endoscopic, Cataract, Breast )  4) Further Noninvasive evaluation -   1) EKG - {yes no:314532}   1) Hx of CVA, CAD, DM, CKD  2) Echo - {yes no:314532}   1) Worsening dyspnea   3) Stress Testing - Active Cardiac Disease - {yes no:314532}  5) Need for medical therapy - Beta Blocker, Statins indicated ? {yes RW:[id number]}  PE: There were no vitals filed for this visit. Physical Examination: {female adult master:310785}  No problem-specific Assessment & Plan notes found for this encounter.    I have independently evaluated patient.  [name] is a 56 y.o. female who is *** risk for a *** risk surgery.  There are/ are not ***  modifiable risk factors (smoking, etc). Altamese Cabal Lewis's RCRI/NSQIP calculation for MACE is: ***.    ***CXR (if asx/healthy/no resp issues don't need) ***EKG (?h/o MI, CAD, etc; usually don't need for low risk sx) ***PFTs (significant cardiopulm hx? OSA/OHS) ***Echo (get if CHF and has not had in >1 year OR if worse HF symptoms) ***Review meds: NO ACE-I/ARB day of surgery. OK to do BB or statin day of esp if vascular sx)  Ashly M. Lajuana Ripple, DO PGY-3, [facility]         Review of Systems: Per HPI. All other systems reviewed and are negative.  Health Maintenance Due  Topic Date Due  . Hepatitis C Screening  Apr 26, 1961  . HIV Screening  10/02/1978  . MAMMOGRAM  10/02/2013  . COLONOSCOPY  10/02/2013  . INFLUENZA VACCINE  07/20/2017    Past Medical History: Patient Active Problem List   Diagnosis Date Noted  . Endometrial hyperplasia, simple 11/08/2017  . Knee osteoarthritis 10/27/2017  . Uterine fibroid 10/03/2017  . Tinnitus 08/29/2017  . Abdominal mass 08/15/2017  . Frequent No-show for appointment 03/07/2017  . Elevated blood sugar 09/01/2016  . Bilateral breast cancer ([place])   . Right knee pain 05/12/2016  . Memory loss 10/18/2015  . Healthcare maintenance 08/17/2015  . Thrombocytosis ([place]) 03/10/2015  . Vaginal dryness 12/02/2014  . Tobacco use disorder 12/02/2014  . Cancer of  overlapping sites of right female breast ([place]) 09/16/2014  . Breast cancer of upper-outer quadrant of left female breast ([place]) 09/16/2014  . Hot flashes due to tamoxifen 05/09/2014  . Lymphedema of arm 11/08/2013  . Chest pain 07/19/2012  . Chronic pain with drug dependence ([place]) 03/31/2012  . Hypokalemia 03/31/2012  . Aortic calcification ([facility])   . Hypertension   . Dyslipidemia     Medications: reviewed and updated Current Outpatient Medications  Medication Sig Dispense Refill  . allopurinol (ZYLOPRIM) 300 MG tablet Take 1 tablet (300 mg total) by mouth daily. 90  tablet 2  . aspirin EC 81 MG tablet Take 81 mg by mouth daily.    Marland Kitchen atorvastatin (LIPITOR) 40 MG tablet Take 1 tablet (40 mg total) by mouth daily at 6 PM. 90 tablet 3  . cholecalciferol (VITAMIN D) 1000 units tablet Take 1,000 Units by mouth daily.    . fluticasone (FLONASE) 50 MCG/ACT nasal spray Place 2 sprays daily into both nostrils. 16 g 6  . HYDROcodone-acetaminophen (NORCO) 5-325 MG tablet Take 1 tablet 2 (two) times daily as needed by mouth for moderate pain or severe pain. 30 tablet 0  . isosorbide mononitrate (IMDUR) 60 MG 24 hr tablet TAKE 1 TABLET BY MOUTH EVERY MORNING 90 tablet 3  . methadone (DOLOPHINE) 5 MG tablet Take 3 tablets (15 mg total) by mouth every 8 (eight) hours as needed for severe pain. Take 3 tablets 3 times a day. 270 tablet 0  . metoprolol succinate (TOPROL-XL) 100 MG 24 hr tablet TAKE 1 TABLET (100 MG TOTAL) BY MOUTH DAILY. TAKE WITH OR IMMEDIATELY FOLLOWING A MEAL. 90 tablet 2  . Multiple Vitamin (MULTIVITAMIN WITH MINERALS) TABS Take 1 tablet by mouth daily.    . nitroGLYCERIN (NITROSTAT) 0.4 MG SL tablet Place 1 tablet (0.4 mg total) under the tongue every 5 (five) minutes as needed for chest pain. 5 tablet 0  . potassium chloride SA (KLOR-CON M20) 20 MEQ tablet TAKE 2 TABLETS (40 MEQ TOTAL) BY MOUTH DAILY. 90 tablet 3  . Skin Protectants, Misc. (EUCERIN) cream Apply topically as needed for dry skin. 454 g 0  . tamoxifen (NOLVADEX) 20 MG tablet Take 1 tablet (20 mg total) by mouth daily. (Patient not taking: Reported on 11/08/2017) 90 tablet 4  . triamterene-hydrochlorothiazide (MAXZIDE) 75-50 MG tablet TAKE 1 TABLET BY MOUTH DAILY. 90 tablet 2   No current facility-administered medications for this visit.     Social Hx:  reports that she has been smoking cigarettes.  She has a 3.75 pack-year smoking history. she has never used smokeless tobacco.   Objective:   LMP 08/07/2011  Physical Exam  Gen: NAD, alert, cooperative with exam, well-appearing HEENT:  NCAT, PERRL, clear conjunctiva, oropharynx clear, supple neck Cardiac: Regular rate and rhythm, normal S1/S2, no murmur, no edema, capillary refill brisk  Respiratory: Clear to auscultation bilaterally, no wheezes, non-labored breathing Gastrointestinal: soft, non tender, non distended, bowel sounds present Skin: no rashes, normal turgor  Neurological: no gross deficits.  Psych: good insight, normal mood and affect  Assessment & Plan:  No problem-specific Assessment & Plan notes found for this encounter.  No orders of the defined types were placed in this encounter.  No orders of the defined types were placed in this encounter.   Smitty Cords, MD [place], PGY-3

## 2017-12-14 ENCOUNTER — Other Ambulatory Visit: Payer: Self-pay

## 2017-12-14 ENCOUNTER — Emergency Department (HOSPITAL_COMMUNITY)
Admission: EM | Admit: 2017-12-14 | Discharge: 2017-12-14 | Disposition: A | Discharge status: Home / Self Care | Payer: Medicaid Other | Attending: Emergency Medicine | Admitting: Emergency Medicine

## 2017-12-14 ENCOUNTER — Emergency Department (HOSPITAL_COMMUNITY): Payer: Medicaid Other

## 2017-12-14 ENCOUNTER — Encounter (HOSPITAL_COMMUNITY): Payer: Self-pay | Admitting: Nurse Practitioner

## 2017-12-14 DIAGNOSIS — I10 Essential (primary) hypertension: Secondary | ICD-10-CM | POA: Insufficient documentation

## 2017-12-14 DIAGNOSIS — Z853 Personal history of malignant neoplasm of breast: Secondary | ICD-10-CM | POA: Insufficient documentation

## 2017-12-14 DIAGNOSIS — M25561 Pain in right knee: Secondary | ICD-10-CM | POA: Diagnosis present

## 2017-12-14 DIAGNOSIS — Z7982 Long term (current) use of aspirin: Secondary | ICD-10-CM | POA: Diagnosis not present

## 2017-12-14 DIAGNOSIS — Z79899 Other long term (current) drug therapy: Secondary | ICD-10-CM | POA: Insufficient documentation

## 2017-12-14 DIAGNOSIS — E876 Hypokalemia: Secondary | ICD-10-CM | POA: Diagnosis not present

## 2017-12-14 DIAGNOSIS — M1711 Unilateral primary osteoarthritis, right knee: Secondary | ICD-10-CM | POA: Diagnosis not present

## 2017-12-14 DIAGNOSIS — R079 Chest pain, unspecified: Secondary | ICD-10-CM | POA: Insufficient documentation

## 2017-12-14 DIAGNOSIS — I2510 Atherosclerotic heart disease of native coronary artery without angina pectoris: Secondary | ICD-10-CM | POA: Diagnosis not present

## 2017-12-14 DIAGNOSIS — F17210 Nicotine dependence, cigarettes, uncomplicated: Secondary | ICD-10-CM | POA: Insufficient documentation

## 2017-12-14 DIAGNOSIS — I252 Old myocardial infarction: Secondary | ICD-10-CM | POA: Insufficient documentation

## 2017-12-14 DIAGNOSIS — R0789 Other chest pain: Secondary | ICD-10-CM | POA: Diagnosis not present

## 2017-12-14 LAB — I-STAT BETA HCG BLOOD, ED (MC, WL, AP ONLY): I-stat hCG, quantitative: <5 m[IU]/mL (ref <5)

## 2017-12-14 LAB — CBC
HEMATOCRIT: 39.9 % (ref 36.0–46.0)
HEMOGLOBIN: 13.5 g/dL (ref 12.0–15.0)
MCH: 29.2 pg (ref 26.0–34.0)
MCHC: 33.8 g/dL (ref 30.0–36.0)
MCV: 86.2 fL (ref 78.0–100.0)
Platelets: 498 10*3/uL — ABNORMAL HIGH (ref 150–400)
RBC: 4.63 MIL/uL (ref 3.87–5.11)
RDW: 15.4 % (ref 11.5–15.5)
WBC: 8.9 10*3/uL (ref 4.0–10.5)

## 2017-12-14 LAB — BASIC METABOLIC PANEL
Anion gap: 8 (ref 5–15)
BUN: 15 mg/dL (ref 6–20)
CHLORIDE: 106 mmol/L (ref 101–111)
CO2: 23 mmol/L (ref 22–32)
Calcium: 8.9 mg/dL (ref 8.9–10.3)
Creatinine, Ser: 0.79 mg/dL (ref 0.44–1.00)
GFR calc Af Amer: >60 mL/min (ref >60)
GFR calc non Af Amer: >60 mL/min (ref >60)
GLUCOSE: 94 mg/dL (ref 65–99)
POTASSIUM: 3.1 mmol/L — AB (ref 3.5–5.1)
Sodium: 137 mmol/L (ref 135–145)

## 2017-12-14 LAB — I-STAT TROPONIN, ED: Troponin i, poc: 0 ng/mL (ref 0.00–0.08)

## 2017-12-14 MED ORDER — OXYCODONE-ACETAMINOPHEN 5-325 MG PO TABS
1.0000 | ORAL_TABLET | Freq: Once | ORAL | Status: AC
  Administered 2017-12-14: 1 via ORAL
  Filled 2017-12-14: qty 1

## 2017-12-14 MED ORDER — OXYCODONE-ACETAMINOPHEN 10-325 MG PO TABS: 1.0000 | ORAL_TABLET | ORAL | 0 refills | Status: DC | PRN
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
## 2017-12-14 NOTE — ED Triage Notes (Signed)
Patient states she fell on Christmas eve and is having right knee pain. Patient is also having chest tightness and left arm pain. She also feels SOB and has been coughing up thick mucus.

## 2017-12-14 NOTE — Discharge Instructions (Addendum)
Use the knee immobilizer as needed for pain, to help you walk.  Might help to use a heating pad on the sore area of your knee.   Your potassium level was low.  Continue to take your potassium supplementation as prescribed, and try to eat foods which contain plenty of potassium.  Your primary care doctor in 1 week for a repeat potassium test.

## 2017-12-22 ENCOUNTER — Other Ambulatory Visit: Payer: Self-pay | Admitting: *Deleted

## 2017-12-23 ENCOUNTER — Other Ambulatory Visit: Payer: Self-pay | Admitting: *Deleted

## 2017-12-23 DIAGNOSIS — I10 Essential (primary) hypertension: Secondary | ICD-10-CM

## 2017-12-23 DIAGNOSIS — Z853 Personal history of malignant neoplasm of breast: Secondary | ICD-10-CM

## 2017-12-23 DIAGNOSIS — M792 Neuralgia and neuritis, unspecified: Secondary | ICD-10-CM

## 2017-12-23 DIAGNOSIS — G8929 Other chronic pain: Secondary | ICD-10-CM

## 2017-12-23 MED ORDER — METHADONE HCL 5 MG PO TABS: 15.0000 mg | ORAL_TABLET | Freq: Three times a day (TID) | ORAL | 0 refills | Status: DC | PRN

## 2017-12-23 MED FILL — METHADONE HCL 5 MG TABLET: 5 | 30 days supply | Qty: 270 | Fill #0

## 2017-12-25 NOTE — Progress Notes (Signed)
Subjective:    Patient ID: [name] , female   DOB: 1961-09-21 , 57 y.o..   MRN: [id number]  HPI  [name] is here for  Chief Complaint  Patient presents with  . Surgical clearance   Patient here today for surgical clearance for a total hysterectomy however she notes that she has other issues that she would like to discuss today instead of receive surgical clearance and these include:  1.  ED follow-up for right knee pain: Patient recently fell on her right knee and seen in the emergency department on December 26.  She notes that she fell after a slip on the tile.  She notes that the knee had been very swollen and she could hardly walk so she went to the emergency department.  She notes that she was given Percocet 10 which "did wonders".  She does have chronic knee pain secondary to osteoarthritis.  She was given a knee immobilizer which she states did help but she is not wearing it today.  She has been out of her Percocet for couple days and notes that the pain has returned.  She has previously seen [facility] and is planning on seeing them again on January 31.  2.  Left arm lymphedema: Patient notes that since she has had breast cancer she has had lymphedema in her left arm.  She notes that she has had cellulitis before in this left arm and she is concerned that it may be returning.  She notes that there are 2 red marks on her elbow that are getting more warm.  She denies any elbow pain, limitation in range of motion, change in muscle strength.  Denies any fevers or chills.  Admits that she does wear compression sleeve at times on her arm.  Admits that she has been seen at the lymphedema clinic previously.  Patient states that her insurance will only cover 3 lymphedema clinic visits all year.  Review of Systems: Per HPI.   Past Medical History: Patient Active Problem List   Diagnosis Date Noted  . History of ST elevation myocardial infarction (STEMI) 12/01/2017    . Endometrial hyperplasia, simple 11/08/2017  . Knee osteoarthritis 10/27/2017  . Uterine fibroid 10/03/2017  . Tinnitus 08/29/2017  . Abdominal mass 08/15/2017  . Frequent No-show for appointment 03/07/2017  . Elevated blood sugar 09/01/2016  . Bilateral breast cancer ([place])   . Right knee pain 05/12/2016  . Memory loss 10/18/2015  . Healthcare maintenance 08/17/2015  . Thrombocytosis ([place]) 03/10/2015  . Tobacco use disorder 12/02/2014  . Cancer of overlapping sites of right female breast ([place]) 09/16/2014  . Breast cancer of upper-outer quadrant of left female breast ([place]) 09/16/2014  . Hot flashes due to tamoxifen 05/09/2014  . Lymphedema of arm 11/08/2013  . Chronic pain with drug dependence ([place]) 03/31/2012  . Hypokalemia 03/31/2012  . Aortic calcification ([facility])   . Hypertension   . Dyslipidemia     Medications: reviewed and updated Current Outpatient Medications  Medication Sig Dispense Refill  . allopurinol (ZYLOPRIM) 300 MG tablet Take 1 tablet (300 mg total) by mouth daily. 90 tablet 2  . aspirin EC 325 MG tablet Take 325 mg by mouth daily.    Marland Kitchen atorvastatin (LIPITOR) 40 MG tablet Take 1 tablet (40 mg total) by mouth daily at 6 PM. 90 tablet 3  . clindamycin (CLEOCIN) 300 MG capsule Take 1 capsule (300 mg total) by mouth 3 (three) times daily for 7 days. 21  capsule 0  . fluticasone (FLONASE) 50 MCG/ACT nasal spray Place 2 sprays daily into both nostrils. (Patient not taking: Reported on 12/14/2017) 16 g 6  . isosorbide mononitrate (IMDUR) 60 MG 24 hr tablet TAKE 1 TABLET BY MOUTH EVERY MORNING 90 tablet 3  . methadone (DOLOPHINE) 5 MG tablet Take 3 tablets (15 mg total) by mouth every 8 (eight) hours as needed for severe pain. Take 3 tablets 3 times a day. 270 tablet 0  . metoprolol succinate (TOPROL-XL) 100 MG 24 hr tablet TAKE 1 TABLET (100 MG TOTAL) BY MOUTH DAILY. TAKE WITH OR IMMEDIATELY FOLLOWING A MEAL. 90 tablet 2  . Multiple Vitamin (MULTIVITAMIN WITH MINERALS)  TABS Take 1 tablet by mouth daily.    . nitroGLYCERIN (NITROSTAT) 0.4 MG SL tablet Place 1 tablet (0.4 mg total) under the tongue every 5 (five) minutes as needed for chest pain. 5 tablet 0  . oxyCODONE-acetaminophen (PERCOCET) 10-325 MG tablet Take 1 tablet by mouth 2 (two) times daily as needed for pain. 60 tablet 0  . potassium chloride SA (KLOR-CON M20) 20 MEQ tablet TAKE 2 TABLETS (40 MEQ TOTAL) BY MOUTH DAILY. 90 tablet 3  . Skin Protectants, Misc. (EUCERIN) cream Apply topically as needed for dry skin. 454 g 0  . tamoxifen (NOLVADEX) 20 MG tablet Take 1 tablet (20 mg total) by mouth daily. 90 tablet 4  . triamterene-hydrochlorothiazide (MAXZIDE) 75-50 MG tablet TAKE 1 TABLET BY MOUTH DAILY. 90 tablet 2   No current facility-administered medications for this visit.     Social Hx:  reports that she has been smoking cigarettes.  She has a 3.75 pack-year smoking history. she has never used smokeless tobacco.   Objective:   BP 110/72   Pulse 73   Temp 97.8 F (36.6 C) (Oral)   Ht 5\' 5"  (1.651 m)   Wt 214 lb 6.4 oz (97.3 kg)   LMP 08/07/2011   SpO2 99%   BMI 35.68 kg/m  Physical Exam  Gen: NAD, alert, cooperative with exam, well-appearing Skin: Lymphedema in the entire left arm, 2 1 inch in circumference areas of erythema and warmth on posterior elbow area, no distinct fluctuance or induration palpated MSK; right knee showing suprapatellar effusion with diffuse joint tenderness, range of motion intact, unable to assess strength secondary to pain  Assessment & Plan:  Right knee pain Recently seen in the ED after a slip and fall on her knee and worsening of her right knee pain.  Knee is very tender and she does have significant suprapatellar effusion.  No knee fracture seen on most recent x-ray in December after her fall.  X-ray shows continued signs of osteoarthritis which she has chronically. - Patient taking 50 mg of methadone every 8 hours as needed for severe pain, she states  this is prescribed from her oncologist -Cannot take NSAIDs due to CAD -We will give 1 refill for Percocet 10 mg twice daily #30 pills as she had relief with this compared to oxycodone -Discussed harmful side effects of chronic opioid use and that this is not the best option for her in the long run.  She expresses good understanding and is insistent that this is the only thing that is helping her until she can see the specialist -She has an appointment with her orthopedic doctor on January 31 (she was supposed to see them in October but was not able to). -Discussed knee brace will likely make things worse as it will cause muscle atrophy and to avoid  using it -Discussed importance of weight loss   Lymphedema of arm Chronic lymphedema of left arm.  There are concerns for very early stage of cellulitis on the superior part of the elbow.  Reassuringly patient is afebrile and does not have any tenderness or fluctuance palpated.  She does have a history of being admitted for sepsis secondary to cellulitis of her arm so we will need to be extra cautious -Begin clindamycin 300 mg 3 times daily x 7-day course - Continue compression sleeve -Patient will call the lymphedema clinic to schedule appointment so she can be seen after antibiotics course is over   Smitty Cords, MD [place], PGY-3

## 2017-12-26 ENCOUNTER — Ambulatory Visit (INDEPENDENT_AMBULATORY_CARE_PROVIDER_SITE_OTHER): Payer: Medicaid Other | Admitting: Family Medicine

## 2017-12-26 ENCOUNTER — Encounter: Payer: Self-pay | Admitting: Family Medicine

## 2017-12-26 ENCOUNTER — Other Ambulatory Visit: Payer: Self-pay

## 2017-12-26 DIAGNOSIS — M25561 Pain in right knee: Secondary | ICD-10-CM

## 2017-12-26 DIAGNOSIS — I890 Lymphedema, not elsewhere classified: Secondary | ICD-10-CM | POA: Diagnosis not present

## 2017-12-26 MED ORDER — CLINDAMYCIN HCL 300 MG PO CAPS: 300.0000 mg | ORAL_CAPSULE | Freq: Three times a day (TID) | ORAL | 0 refills | Status: AC

## 2017-12-26 MED ORDER — CLINDAMYCIN HCL 300 MG PO CAPS: 300.0000 mg | ORAL_CAPSULE | Freq: Three times a day (TID) | ORAL | 0 refills | Status: DC

## 2017-12-26 MED ORDER — OXYCODONE-ACETAMINOPHEN 10-325 MG PO TABS: 1.0000 | ORAL_TABLET | Freq: Two times a day (BID) | ORAL | 0 refills | Status: DC | PRN

## 2017-12-26 MED FILL — CLINDAMYCIN HCL 300 MG CAPS: 300 | 7 days supply | Qty: 21 | Fill #0

## 2017-12-26 NOTE — Patient Instructions (Signed)
Thank you for coming in today, it was so nice to see you! Today we talked about:    Right knee pain: The most important thing will be for you to see the orthopedic doctor. Please keep your appointment with him. I have refilled your Percocet to hold you over until you can see the orthopedic.  Lymphedema; I have given you a prescription for clindamycin to take for 1 week course.  He take 1 pill 3 times a day.  This will take care of any infection brewing in her left arm.  Please continue to wear a compression sleeve and call the lymphedema clinic today to get an appointment as soon as possible.  Reasons to go to the emergency department as if you have fever, chills, redness and swelling get worse in your left arm.  If you have any questions or concerns, please do not hesitate to call the office at [phone]. You can also message me directly via MyChart.   Sincerely,  Smitty Cords, MD

## 2017-12-27 ENCOUNTER — Ambulatory Visit: Payer: Medicaid Other | Admitting: Internal Medicine

## 2017-12-27 ENCOUNTER — Other Ambulatory Visit: Payer: Self-pay | Admitting: *Deleted

## 2017-12-27 ENCOUNTER — Telehealth: Payer: Self-pay

## 2017-12-27 DIAGNOSIS — I890 Lymphedema, not elsewhere classified: Secondary | ICD-10-CM

## 2017-12-27 DIAGNOSIS — C50412 Malignant neoplasm of upper-outer quadrant of left female breast: Secondary | ICD-10-CM

## 2017-12-27 DIAGNOSIS — Z171 Estrogen receptor negative status [ER-]: Secondary | ICD-10-CM

## 2017-12-27 NOTE — Telephone Encounter (Signed)
No show letter mailed to patient and [facility] informed as they referred her here,( Dr Excell Seltzer)

## 2017-12-27 NOTE — Assessment & Plan Note (Addendum)
Recently seen in the ED after a slip and fall on her knee and worsening of her right knee pain.  Knee is very tender and she does have significant suprapatellar effusion.  No knee fracture seen on most recent x-ray in December after her fall.  X-ray shows continued signs of osteoarthritis which she has chronically. - Patient taking 50 mg of methadone every 8 hours as needed for severe pain, she states this is prescribed from her oncologist -Cannot take NSAIDs due to CAD -We will give 1 refill for Percocet 10 mg twice daily #30 pills as she had relief with this compared to oxycodone -Discussed harmful side effects of chronic opioid use and that this is not the best option for her in the long run.  She expresses good understanding and is insistent that this is the only thing that is helping her until she can see the specialist -She has an appointment with her orthopedic doctor on January 31 (she was supposed to see them in October but was not able to). -Discussed knee brace will likely make things worse as it will cause muscle atrophy and to avoid using it -Discussed importance of weight loss

## 2017-12-27 NOTE — Assessment & Plan Note (Signed)
Chronic lymphedema of left arm.  There are concerns for very early stage of cellulitis on the superior part of the elbow.  Reassuringly patient is afebrile and does not have any tenderness or fluctuance palpated.  She does have a history of being admitted for sepsis secondary to cellulitis of her arm so we will need to be extra cautious -Begin clindamycin 300 mg 3 times daily x 7-day course - Continue compression sleeve -Patient will call the lymphedema clinic to schedule appointment so she can be seen after antibiotics course is over

## 2017-12-28 ENCOUNTER — Ambulatory Visit: Payer: Medicaid Other | Admitting: Obstetrics and Gynecology

## 2017-12-28 ENCOUNTER — Telehealth: Payer: Self-pay | Admitting: *Deleted

## 2017-12-28 ENCOUNTER — Telehealth: Payer: Self-pay | Admitting: Internal Medicine

## 2017-12-28 NOTE — Telephone Encounter (Signed)
After Hours Call:  Received a call from patient stating that she has been having itching and red bumps on her left arm since starting Clindamycin 3 days ago. Per chart review, she was started on Clindamycin 1/7 for concerns for early cellulitis in the setting of chronic lymphedema of the left arm. She has been taking the Clindamycin as prescribed. She states she was reading the side effects of Clindamycin this evening and she read that a rash can be a sign of a life threatening allergic reaction and that she should notify her doctor immediately if this happens. I explained that allergic reactions from medications typically cause hives and itching throughout the entire body, rather than just localized to one location. I also explained that severe life threatening allergic reactions include shortness of breath and feeling like your throat is closing up. Patient then stated that she thinks she is feeling short of breath, so I recommended that she go to the emergency department for evaluation. Patient voiced understanding.  Will forward to PCP.  Hyman Bible, MD PGY-3

## 2017-12-28 NOTE — Telephone Encounter (Signed)
Pt wanted Dr Rip Harbour to be informed that she was not cleared for surgery by her primary care Dr for surgery do to an infection in her arm.

## 2017-12-29 ENCOUNTER — Ambulatory Visit (INDEPENDENT_AMBULATORY_CARE_PROVIDER_SITE_OTHER): Payer: Medicaid Other | Admitting: *Deleted

## 2017-12-29 DIAGNOSIS — I890 Lymphedema, not elsewhere classified: Secondary | ICD-10-CM

## 2017-12-29 NOTE — Progress Notes (Signed)
   Patient walked in at closing. Left arm and hand is red and swollen . Has hx of left arm lymphedema following bilat mastectomy in 2012. Discussed with Preceptor, Dr. Erin Hearing, and patient sent to Urgent Care. Hubbard Hartshorn, RN, BSN

## 2018-01-03 ENCOUNTER — Encounter: Payer: Self-pay | Admitting: Obstetrics & Gynecology

## 2018-01-03 ENCOUNTER — Ambulatory Visit (INDEPENDENT_AMBULATORY_CARE_PROVIDER_SITE_OTHER): Payer: Medicaid Other | Admitting: Obstetrics & Gynecology

## 2018-01-03 VITALS — BP 129/87 | HR 74 | Wt 215.7 lb

## 2018-01-03 DIAGNOSIS — M25561 Pain in right knee: Secondary | ICD-10-CM

## 2018-01-03 NOTE — Patient Instructions (Signed)
Hysterectomy Information A hysterectomy is a surgery to remove your uterus. After surgery, you will no longer have periods. Also, you will not be able to get pregnant. Reasons for this surgery  You have bleeding that is not normal and keeps coming back.  You have lasting (chronic) lower belly (pelvic) pain.  You have a lasting infection.  The lining of your uterus grows outside your uterus.  The lining of your uterus grows in the muscle of your uterus.  Your uterus falls down into your vagina.  You have a growth in your uterus that causes problems.  You have cells that could turn into cancer (precancerous cells).  You have cancer of the uterus or cervix. Types There are 3 types of hysterectomies. Depending on the type, the surgery will:  Remove the top part of the uterus only.  Remove the uterus and the cervix.  Remove the uterus, cervix, and tissue that holds the uterus in place in the lower belly.  Ways a hysterectomy can be performed There are 5 ways this surgery can be performed.  A cut (incision) is made in the belly (abdomen). The uterus is taken out through the cut.  A cut is made in the vagina. The uterus is taken out through the cut.  Three or four cuts are made in the belly. A surgical device with a camera is put through one of the cuts. The uterus is cut into small pieces. The uterus is taken out through the cuts or the vagina.  Three or four cuts are made in the belly. A surgical device with a camera is put through one of the cuts. The uterus is taken out through the vagina.  Three or four cuts are made in the belly. A surgical device that is controlled by a computer makes a visual image. The device helps the surgeon control the surgical tools. The uterus is cut into small pieces. The pieces are taken out through the cuts or through the vagina.  What can I expect after the surgery?  You will be given pain medicine.  You will need help at home for 3-5 days  after surgery.  You will need to see your doctor in 2-4 weeks after surgery.  You may get hot flashes, have night sweats, and have trouble sleeping.  You may need to have Pap tests in the future if your surgery was related to cancer. Talk to your doctor. It is still good to have regular exams. This information is not intended to replace advice given to you by your health care provider. Make sure you discuss any questions you have with your health care provider. Document Released: 02/28/2012 Document Revised: 05/13/2016 Document Reviewed: 08/13/2013 Elsevier Interactive Patient Education  2018 Elsevier Inc.  

## 2018-01-03 NOTE — Progress Notes (Signed)
Pt is in the office for follow up. Pt previously had appt on 11-08-17 and discussed surgery. Pt reports she got an infection in her arm and then had a reaction to antibiotics prescribed. Pt is following up to see how to proceed with surgery. She has a f/u at the [facility]. She was told to have CBC here and then will get medical clearance for hysterectomy. Dr Rip Harbour will schedule surgery once clearance is confirmed.  Woodroe Mode, MD 01/03/2018

## 2018-01-04 LAB — CBC
HEMATOCRIT: 42.5 % (ref 34.0–46.6)
HEMOGLOBIN: 14.4 g/dL (ref 11.1–15.9)
MCH: 29.1 pg (ref 26.6–33.0)
MCHC: 33.9 g/dL (ref 31.5–35.7)
MCV: 86 fL (ref 79–97)
Platelets: 571 10*3/uL — ABNORMAL HIGH (ref 150–379)
RBC: 4.95 x10E6/uL (ref 3.77–5.28)
RDW: 16.1 % — ABNORMAL HIGH (ref 12.3–15.4)
WBC: 12.6 10*3/uL — ABNORMAL HIGH (ref 3.4–10.8)

## 2018-01-05 ENCOUNTER — Telehealth: Payer: Self-pay

## 2018-01-05 NOTE — Telephone Encounter (Signed)
Left VM message.

## 2018-01-05 NOTE — Telephone Encounter (Signed)
-----   Message from Woodroe Mode, MD sent at 01/05/2018 10:22 AM EST ----- CBC normal

## 2018-01-16 ENCOUNTER — Other Ambulatory Visit: Payer: Self-pay

## 2018-01-16 ENCOUNTER — Ambulatory Visit (INDEPENDENT_AMBULATORY_CARE_PROVIDER_SITE_OTHER): Payer: Medicaid Other | Admitting: Family Medicine

## 2018-01-16 ENCOUNTER — Encounter: Payer: Self-pay | Admitting: Family Medicine

## 2018-01-16 VITALS — BP 122/78 | HR 76 | Temp 97.9°F | Ht 65.0 in | Wt 211.0 lb

## 2018-01-16 DIAGNOSIS — Z01810 Encounter for preprocedural cardiovascular examination: Secondary | ICD-10-CM | POA: Diagnosis not present

## 2018-01-16 MED ORDER — OXYCODONE-ACETAMINOPHEN 5-325 MG PO TABS: 1.0000 | ORAL_TABLET | Freq: Two times a day (BID) | ORAL | 0 refills | Status: DC | PRN

## 2018-01-16 NOTE — Progress Notes (Signed)
Subjective:    Patient ID: [name] , female   DOB: Oct 04, 1961 , 57 y.o..   MRN: [id number]  HPI  [name] is a 57 y.o. female with PMH of Breast Cancer, CAD, HTN, HLD, Chronic Pain with B/L knee arthritis, who is here for preoperative clearance for total abdominal hysterectomy (TAH) and bilateral salpingo-oophorectomy here for  Chief Complaint  Patient presents with  . Surgery Clearance    1) High Risk Cardiac Conditions Recent MI - No. Had STEMI November, 2008, bare-metal stent mid RCA; 08/2008 DES to LAD (moderate in-stent restenosis mid RCA 60%); 01/2009 MV: No ischemia. Decompensated Heart Failure - No. 03/2012 Echo: EF 60-65%, Gr1DD, PASP 41mmHg. Had repeat Echo on 01/2013 showing normal findings including a LV EF: 60%-65% and no signs of CHF.  Unstable angina - No.  Symptomatic arrythmia - No.  Sx Valvular Disease - No. Intermediate Risk Factors - DM, CKD, CVA, CHF, CAD - Yes.    Functional Status - Sometimes gets fatigued when walking to mail box and going up stairs 3) Surgery Specific Risk - Low (Endoscopic)  Review of Systems: Per HPI.   Past Medical History: Patient Active Problem List   Diagnosis Date Noted  . History of ST elevation myocardial infarction (STEMI) 12/01/2017  . Endometrial hyperplasia, simple 11/08/2017  . Knee osteoarthritis 10/27/2017  . Uterine fibroid 10/03/2017  . Tinnitus 08/29/2017  . Abdominal mass 08/15/2017  . Frequent No-show for appointment 03/07/2017  . Elevated blood sugar 09/01/2016  . Bilateral breast cancer ([place])   . Right knee pain 05/12/2016  . Memory loss 10/18/2015  . Healthcare maintenance 08/17/2015  . Thrombocytosis ([place]) 03/10/2015  . Tobacco use disorder 12/02/2014  . Cancer of overlapping sites of right female breast ([place]) 09/16/2014  . Breast cancer of upper-outer quadrant of left female breast ([place]) 09/16/2014  . Hot flashes due to tamoxifen 05/09/2014  . Lymphedema of arm 11/08/2013  . Chronic pain  with drug dependence ([place]) 03/31/2012  . Hypokalemia 03/31/2012  . Aortic calcification ([facility])   . Hypertension   . Dyslipidemia     Medications: reviewed and updated Current Outpatient Medications  Medication Sig Dispense Refill  . allopurinol (ZYLOPRIM) 300 MG tablet Take 1 tablet (300 mg total) by mouth daily. 90 tablet 2  . aspirin EC 325 MG tablet Take 325 mg by mouth daily.    Marland Kitchen atorvastatin (LIPITOR) 40 MG tablet Take 1 tablet (40 mg total) by mouth daily at 6 PM. 90 tablet 3  . isosorbide mononitrate (IMDUR) 60 MG 24 hr tablet TAKE 1 TABLET BY MOUTH EVERY MORNING 90 tablet 3  . methadone (DOLOPHINE) 5 MG tablet Take 3 tablets (15 mg total) by mouth every 8 (eight) hours as needed for severe pain. Take 3 tablets 3 times a day. 270 tablet 0  . metoprolol succinate (TOPROL-XL) 100 MG 24 hr tablet TAKE 1 TABLET (100 MG TOTAL) BY MOUTH DAILY. TAKE WITH OR IMMEDIATELY FOLLOWING A MEAL. 90 tablet 2  . potassium chloride SA (KLOR-CON M20) 20 MEQ tablet TAKE 2 TABLETS (40 MEQ TOTAL) BY MOUTH DAILY. 90 tablet 3  . tamoxifen (NOLVADEX) 20 MG tablet Take 1 tablet (20 mg total) by mouth daily. 90 tablet 4  . triamterene-hydrochlorothiazide (MAXZIDE) 75-50 MG tablet TAKE 1 TABLET BY MOUTH DAILY. 90 tablet 2  . fluticasone (FLONASE) 50 MCG/ACT nasal spray Place 2 sprays daily into both nostrils. (Patient not taking: Reported on 12/14/2017) 16 g 6  . Multiple Vitamin (MULTIVITAMIN  WITH MINERALS) TABS Take 1 tablet by mouth daily.    . nitroGLYCERIN (NITROSTAT) 0.4 MG SL tablet Place 1 tablet (0.4 mg total) under the tongue every 5 (five) minutes as needed for chest pain. (Patient not taking: Reported on 01/16/2018) 5 tablet 0  . oxyCODONE-acetaminophen (PERCOCET) 5-325 MG tablet Take 1 tablet by mouth 2 (two) times daily as needed for severe pain. 30 tablet 0  . Skin Protectants, Misc. (EUCERIN) cream Apply topically as needed for dry skin. (Patient not taking: Reported on 01/16/2018) 454 g 0   No  current facility-administered medications for this visit.     Social Hx:  reports that she has been smoking cigarettes.  She has a 3.75 pack-year smoking history. she has never used smokeless tobacco.   Objective:   BP 122/78   Pulse 76   Temp 97.9 F (36.6 C) (Oral)   Ht 5\' 5"  (1.651 m)   Wt 211 lb (95.7 kg)   LMP 08/07/2011   SpO2 99%   BMI 35.11 kg/m  Physical Exam  Gen: NAD, alert, cooperative with exam, well-appearing HEENT: NCAT, PERRL, clear conjunctiva, oropharynx clear, supple neck Cardiac: Regular rate and rhythm, normal S1/S2, no murmur, no edema, capillary refill brisk  Respiratory: Clear to auscultation bilaterally, no wheezes, non-labored breathing Gastrointestinal: soft, non tender, non distended, bowel sounds present Skin: no rashes, normal turgor  Neurological: no gross deficits.  Psych: good insight, normal mood and affect  Assessment & Plan:   1. Encounter for pre-operative clearance: I have independently evaluated patient. [name] is a 57 y.o. female who is being evaluated for total abdominal hysterectomy (TAH) and bilateral salpingo-oophorectomy. Although surgery is technically low risk if endoscopic, I feel that a cardiologic evaluation is warranted given her hx of worsening fatigue with climbing stairs and walking to the mailbox. Her last echo in 2014 was normal but she does have a history of MI.  - Ambulatory referral to Cardiology  2. Chronic knee pain: Patient endorsed that she continues to have knee pain. Has hx OA in knees. Discussed that percocet is not a good long term option. Discussed that if she continues to have pain she will need to take Tylenol, see PT, and continue to follow with orthopedics. She is already on methadone from her oncologist. - Will taper percocet, prescribing 5 mg BID PRN (was previously on 10mg ) and discussed this was the LAST opioid prescription she would be prescribed for her knee pain  Orders Placed This Encounter    Procedures  . Ambulatory referral to Cardiology    Referral Priority:   Routine    Referral Type:   Consultation    Referral Reason:   Specialty Services Required    Requested Specialty:   Cardiology    Number of Visits Requested:   1   Meds ordered this encounter  Medications  . oxyCODONE-acetaminophen (PERCOCET) 5-325 MG tablet    Sig: Take 1 tablet by mouth 2 (two) times daily as needed for severe pain.    Dispense:  30 tablet    Refill:  0    Do not fill before 12/26/17    Smitty Cords, MD [place], PGY-3

## 2018-01-16 NOTE — Patient Instructions (Addendum)
Thank you for coming in today, it was so nice to see you! Today we talked about:    Surgical clearance: we have discussed surgical clearance today. I think it would be safe for you to pursue the surgery. I have referred you to a cardiologist per your request. Someone will call you to schedule this.   You have 1 more refill on your percocet and that is it. Please continue to follow with your oncologist and your orthopedic specialist.   If we ordered any tests today, you will be notified via telephone of any abnormalities. If everything is normal you will get a letter in the mail.   If you have any questions or concerns, please do not hesitate to call the office at [phone]. You can also message me directly via MyChart.   Sincerely,  Smitty Cords, MD

## 2018-01-19 ENCOUNTER — Other Ambulatory Visit: Payer: Self-pay | Admitting: *Deleted

## 2018-01-19 DIAGNOSIS — M792 Neuralgia and neuritis, unspecified: Secondary | ICD-10-CM

## 2018-01-19 DIAGNOSIS — Z853 Personal history of malignant neoplasm of breast: Secondary | ICD-10-CM

## 2018-01-19 DIAGNOSIS — G8929 Other chronic pain: Secondary | ICD-10-CM

## 2018-01-19 DIAGNOSIS — I10 Essential (primary) hypertension: Secondary | ICD-10-CM

## 2018-01-19 MED ORDER — METHADONE HCL 5 MG PO TABS: 15.0000 mg | ORAL_TABLET | Freq: Three times a day (TID) | ORAL | 0 refills | Status: DC | PRN

## 2018-01-20 MED FILL — METHADONE HCL 5 MG TABS: 5 | 30 days supply | Qty: 270 | Fill #0

## 2018-01-25 ENCOUNTER — Ambulatory Visit: Payer: Medicaid Other | Admitting: Physical Therapy

## 2018-02-08 ENCOUNTER — Telehealth: Payer: Self-pay | Admitting: *Deleted

## 2018-02-08 ENCOUNTER — Encounter: Payer: Self-pay | Admitting: Physician Assistant

## 2018-02-08 ENCOUNTER — Ambulatory Visit (INDEPENDENT_AMBULATORY_CARE_PROVIDER_SITE_OTHER): Payer: Medicaid Other | Admitting: Physician Assistant

## 2018-02-08 ENCOUNTER — Other Ambulatory Visit: Payer: Self-pay | Admitting: *Deleted

## 2018-02-08 ENCOUNTER — Encounter (INDEPENDENT_AMBULATORY_CARE_PROVIDER_SITE_OTHER): Payer: Self-pay

## 2018-02-08 VITALS — BP 118/68 | HR 67 | Ht 65.0 in | Wt 217.8 lb

## 2018-02-08 DIAGNOSIS — I2510 Atherosclerotic heart disease of native coronary artery without angina pectoris: Secondary | ICD-10-CM | POA: Diagnosis not present

## 2018-02-08 DIAGNOSIS — Z720 Tobacco use: Secondary | ICD-10-CM | POA: Diagnosis not present

## 2018-02-08 DIAGNOSIS — E785 Hyperlipidemia, unspecified: Secondary | ICD-10-CM | POA: Diagnosis not present

## 2018-02-08 DIAGNOSIS — I700 Atherosclerosis of aorta: Secondary | ICD-10-CM | POA: Diagnosis not present

## 2018-02-08 DIAGNOSIS — Z01810 Encounter for preprocedural cardiovascular examination: Secondary | ICD-10-CM

## 2018-02-08 DIAGNOSIS — F1410 Cocaine abuse, uncomplicated: Secondary | ICD-10-CM

## 2018-02-08 DIAGNOSIS — I10 Essential (primary) hypertension: Secondary | ICD-10-CM | POA: Diagnosis not present

## 2018-02-08 MED ORDER — NITROGLYCERIN 0.4 MG SL SUBL: 0.4000 mg | SUBLINGUAL_TABLET | SUBLINGUAL | 6 refills | Status: DC | PRN

## 2018-02-08 NOTE — Addendum Note (Signed)
Addended by: Briant Cedar on: 02/08/2018 05:13 PM   Modules accepted: Orders
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
## 2018-02-08 NOTE — Progress Notes (Signed)
I have no strong feeling either way. Don't remember her.

## 2018-02-08 NOTE — Telephone Encounter (Signed)
Pt called to this RN to request contact with [employer] for new compression sleeve.  Eneida left number for [place] at above [phone].  Per communication with Sharyn Lull - she inquired when was last visit with H&P noting lymphedema within the last 6 months ( required per billing insurance ). Once above is available - contact them for further ordering.  ( pt was seen greater then 6 months ago ).  Above discussed with pt - who verbalized understanding as well and agreement.  She is also in the process of being scheduled for surgery per GYN - fibroids.  Request for appointment sent to scheduling.

## 2018-02-08 NOTE — Patient Instructions (Signed)
Medication Instructions:  1. A REFILL WAS SENT IN FOR YOUR NITROGLYCERIN  Labwork: YOU WILL NEED FASTING LIPID AND LIVER , BMET TO BE DONE AT YOUR CONVENIENCE; CALL THE OFFICE TO SCHEDULE YOUR LAB APPT WHEN YOU ARE READY. [phone]   Testing/Procedures: NONE ORDERED TODAY  Follow-Up: Your physician wants you to follow-up in: [place], [facility] You will receive a reminder letter in the mail two months in advance. If you don't receive a letter, please call our office to schedule the follow-up appointment.   Any Other Special Instructions Will Be Listed Below (If Applicable).     If you need a refill on your cardiac medications before your next appointment, please call your pharmacy.

## 2018-02-09 ENCOUNTER — Telehealth: Payer: Self-pay | Admitting: Oncology

## 2018-02-09 NOTE — Telephone Encounter (Signed)
Scheduled appt per 2/20 sch message - left message with appt date and time.  

## 2018-02-13 ENCOUNTER — Ambulatory Visit: Payer: Medicaid Other | Attending: Oncology | Admitting: Physical Therapy

## 2018-02-13 ENCOUNTER — Ambulatory Visit: Payer: Medicaid Other | Admitting: Adult Health

## 2018-02-14 ENCOUNTER — Inpatient Hospital Stay: Payer: Medicaid Other | Attending: Adult Health | Admitting: Adult Health

## 2018-02-14 ENCOUNTER — Encounter: Payer: Self-pay | Admitting: Adult Health

## 2018-02-14 VITALS — BP 110/78 | HR 70 | Temp 97.8°F | Resp 18 | Ht 65.0 in | Wt 216.2 lb

## 2018-02-14 DIAGNOSIS — I890 Lymphedema, not elsewhere classified: Secondary | ICD-10-CM | POA: Diagnosis not present

## 2018-02-14 DIAGNOSIS — M792 Neuralgia and neuritis, unspecified: Secondary | ICD-10-CM

## 2018-02-14 DIAGNOSIS — D473 Essential (hemorrhagic) thrombocythemia: Secondary | ICD-10-CM | POA: Diagnosis not present

## 2018-02-14 DIAGNOSIS — Z170 Estrogen receptor positive status [ER+]: Secondary | ICD-10-CM | POA: Diagnosis not present

## 2018-02-14 DIAGNOSIS — Z171 Estrogen receptor negative status [ER-]: Secondary | ICD-10-CM

## 2018-02-14 DIAGNOSIS — C50412 Malignant neoplasm of upper-outer quadrant of left female breast: Secondary | ICD-10-CM | POA: Insufficient documentation

## 2018-02-14 DIAGNOSIS — R52 Pain, unspecified: Secondary | ICD-10-CM | POA: Diagnosis not present

## 2018-02-14 DIAGNOSIS — I10 Essential (primary) hypertension: Secondary | ICD-10-CM | POA: Insufficient documentation

## 2018-02-14 DIAGNOSIS — C773 Secondary and unspecified malignant neoplasm of axilla and upper limb lymph nodes: Secondary | ICD-10-CM

## 2018-02-14 DIAGNOSIS — G8929 Other chronic pain: Secondary | ICD-10-CM

## 2018-02-14 DIAGNOSIS — Z79810 Long term (current) use of selective estrogen receptor modulators (SERMs): Secondary | ICD-10-CM | POA: Insufficient documentation

## 2018-02-14 DIAGNOSIS — F17210 Nicotine dependence, cigarettes, uncomplicated: Secondary | ICD-10-CM

## 2018-02-14 DIAGNOSIS — Z853 Personal history of malignant neoplasm of breast: Secondary | ICD-10-CM

## 2018-02-14 DIAGNOSIS — C50911 Malignant neoplasm of unspecified site of right female breast: Secondary | ICD-10-CM | POA: Diagnosis not present

## 2018-02-14 DIAGNOSIS — C50811 Malignant neoplasm of overlapping sites of right female breast: Secondary | ICD-10-CM

## 2018-02-14 MED ORDER — METHADONE HCL 5 MG PO TABS: 15.0000 mg | ORAL_TABLET | Freq: Three times a day (TID) | ORAL | 0 refills | Status: DC | PRN
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
## 2018-02-17 ENCOUNTER — Ambulatory Visit: Payer: Medicaid Other | Admitting: Obstetrics and Gynecology

## 2018-02-17 MED FILL — METHADONE HCL 5 MG TABS: 5 | 30 days supply | Qty: 270 | Fill #0

## 2018-02-22 ENCOUNTER — Ambulatory Visit: Payer: Medicaid Other | Admitting: Obstetrics and Gynecology

## 2018-02-22 ENCOUNTER — Encounter: Payer: Self-pay | Admitting: Obstetrics and Gynecology

## 2018-02-22 DIAGNOSIS — D259 Leiomyoma of uterus, unspecified: Secondary | ICD-10-CM

## 2018-02-23 ENCOUNTER — Encounter: Payer: Self-pay | Admitting: Obstetrics and Gynecology

## 2018-02-23 NOTE — Progress Notes (Signed)
[name] presents to discuss surgery.  Pt with known enlarged uterus consistent with uterine fibroids EMBX small fragment suggestive of simple hyperplasia remainder of BX no hyperplasia. She has been cleared by PCP and cardiology. Heme/Onc agrees with BSO at time of surgery as well.   Discussed with [name] the possibility of having procedure completed via [facility] with my partner Dr Lavonia Drafts. She is interested.  D/T H/O Stage 3 Breast cancer will schedule CT scan of Abd/Pelvis. Last scan was in 2015 and was unremarkable.   A/P Uterine fibroids        ? Endometrial simple hyperplasia        Stage 3 Breast Ca  Will schedule CT scan and sent records for Dr. Ihor Dow to review.

## 2018-03-06 ENCOUNTER — Other Ambulatory Visit: Payer: Self-pay

## 2018-03-06 DIAGNOSIS — G8929 Other chronic pain: Secondary | ICD-10-CM

## 2018-03-06 DIAGNOSIS — I10 Essential (primary) hypertension: Secondary | ICD-10-CM

## 2018-03-06 DIAGNOSIS — M792 Neuralgia and neuritis, unspecified: Secondary | ICD-10-CM

## 2018-03-06 DIAGNOSIS — Z853 Personal history of malignant neoplasm of breast: Secondary | ICD-10-CM

## 2018-03-07 MED ORDER — ISOSORBIDE MONONITRATE ER 60 MG PO TB24: 60.0000 mg | ORAL_TABLET | Freq: Every morning | ORAL | 0 refills | Status: DC

## 2018-03-14 ENCOUNTER — Other Ambulatory Visit: Payer: Self-pay

## 2018-03-14 ENCOUNTER — Ambulatory Visit (INDEPENDENT_AMBULATORY_CARE_PROVIDER_SITE_OTHER): Payer: Medicaid Other | Admitting: Family Medicine

## 2018-03-14 ENCOUNTER — Encounter: Payer: Self-pay | Admitting: Family Medicine

## 2018-03-14 VITALS — BP 112/74 | HR 68 | Temp 98.2°F | Ht 65.0 in | Wt 214.0 lb

## 2018-03-14 DIAGNOSIS — E785 Hyperlipidemia, unspecified: Secondary | ICD-10-CM

## 2018-03-14 DIAGNOSIS — Z1159 Encounter for screening for other viral diseases: Secondary | ICD-10-CM

## 2018-03-14 DIAGNOSIS — D75839 Thrombocytosis, unspecified: Secondary | ICD-10-CM

## 2018-03-14 DIAGNOSIS — M1711 Unilateral primary osteoarthritis, right knee: Secondary | ICD-10-CM

## 2018-03-14 DIAGNOSIS — Z0000 Encounter for general adult medical examination without abnormal findings: Secondary | ICD-10-CM

## 2018-03-14 DIAGNOSIS — M25561 Pain in right knee: Secondary | ICD-10-CM

## 2018-03-14 DIAGNOSIS — Z114 Encounter for screening for human immunodeficiency virus [HIV]: Secondary | ICD-10-CM

## 2018-03-14 DIAGNOSIS — I10 Essential (primary) hypertension: Secondary | ICD-10-CM

## 2018-03-14 DIAGNOSIS — D473 Essential (hemorrhagic) thrombocythemia: Secondary | ICD-10-CM

## 2018-03-14 DIAGNOSIS — G8929 Other chronic pain: Secondary | ICD-10-CM

## 2018-03-14 MED ORDER — ACETAMINOPHEN 500 MG PO TABS: 1000.0000 mg | ORAL_TABLET | Freq: Three times a day (TID) | ORAL | 1 refills | Status: DC | PRN

## 2018-03-14 NOTE — Patient Instructions (Signed)
Thank you for coming in today, it was so nice to see you! Today we talked about:    Leg pain: The bumps you are feeling are likely from varicose veins. I do not see any signs of a blood clot. You can take extra strength tylenol for pain  We are going to check your blood work today  Please follow up in 3 months. You can schedule this appointment at the front desk before you leave or call the clinic.  Bring in all your medications or supplements to each appointment for review.   If we ordered any tests today, you will be notified via telephone of any abnormalities. If everything is normal you will get a letter in the mail.   If you have any questions or concerns, please do not hesitate to call the office at [phone]. You can also message me directly via MyChart.   Sincerely,  Smitty Cords, MD

## 2018-03-14 NOTE — Assessment & Plan Note (Signed)
Chronic knee pain particularly in her right knee.  She is also concerned about having blood clots in her legs that they are painful, no signs of blood clots but she does have varicose veins.  She is following with orthopedics and states that she plans on having a knee replacement in August.  She pleaded to have a refill on her Percocet despite discussing at the last visit that we will not be continuing that medication.  Discussed thoroughly with patient the risks of being on chronic opioids especially when she is already taking methadone (oncology prescribing this).  She also has a history of cocaine abuse and -1000 mg of Tylenol every 8 hours as needed for pain - We will not prescribe any opioids -Continue to follow with orthopedics -Offered pain management referral, patient declined
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
## 2018-03-15 LAB — BASIC METABOLIC PANEL
BUN / CREAT RATIO: 17 (ref 9–23)
BUN: 15 mg/dL (ref 6–24)
CO2: 23 mmol/L (ref 20–29)
Calcium: 9.8 mg/dL (ref 8.7–10.2)
Chloride: 103 mmol/L (ref 96–106)
Creatinine, Ser: 0.9 mg/dL (ref 0.57–1.00)
GFR calc Af Amer: 84 mL/min/{1.73_m2} (ref >59)
GFR, EST NON AFRICAN AMERICAN: 73 mL/min/{1.73_m2} (ref >59)
Glucose: 83 mg/dL (ref 65–99)
POTASSIUM: 4.1 mmol/L (ref 3.5–5.2)
Sodium: 143 mmol/L (ref 134–144)

## 2018-03-15 LAB — CBC
HEMATOCRIT: 43.8 % (ref 34.0–46.6)
Hemoglobin: 14.8 g/dL (ref 11.1–15.9)
MCH: 29.1 pg (ref 26.6–33.0)
MCHC: 33.8 g/dL (ref 31.5–35.7)
MCV: 86 fL (ref 79–97)
Platelets: 539 10*3/uL — ABNORMAL HIGH (ref 150–379)
RBC: 5.08 x10E6/uL (ref 3.77–5.28)
RDW: 16.5 % — AB (ref 12.3–15.4)
WBC: 8.6 10*3/uL (ref 3.4–10.8)

## 2018-03-15 LAB — LIPID PANEL
CHOL/HDL RATIO: 2.7 ratio (ref 0.0–4.4)
Cholesterol, Total: 122 mg/dL (ref 100–199)
HDL: 46 mg/dL (ref >39)
LDL CALC: 50 mg/dL (ref 0–99)
Triglycerides: 132 mg/dL (ref 0–149)
VLDL Cholesterol Cal: 26 mg/dL (ref 5–40)

## 2018-03-15 LAB — HEPATITIS C ANTIBODY

## 2018-03-15 LAB — HIV ANTIBODY (ROUTINE TESTING W REFLEX): HIV Screen 4th Generation wRfx: NONREACTIVE

## 2018-03-17 ENCOUNTER — Encounter: Payer: Self-pay | Admitting: Family Medicine

## 2018-03-20 ENCOUNTER — Other Ambulatory Visit: Payer: Self-pay | Admitting: Oncology

## 2018-03-20 ENCOUNTER — Other Ambulatory Visit: Payer: Self-pay

## 2018-03-20 DIAGNOSIS — G8929 Other chronic pain: Secondary | ICD-10-CM

## 2018-03-20 DIAGNOSIS — Z853 Personal history of malignant neoplasm of breast: Secondary | ICD-10-CM

## 2018-03-20 DIAGNOSIS — M792 Neuralgia and neuritis, unspecified: Secondary | ICD-10-CM

## 2018-03-20 DIAGNOSIS — I10 Essential (primary) hypertension: Secondary | ICD-10-CM

## 2018-03-20 MED ORDER — METHADONE HCL 5 MG PO TABS: 15.0000 mg | ORAL_TABLET | Freq: Three times a day (TID) | ORAL | 0 refills | Status: DC | PRN

## 2018-03-20 NOTE — Telephone Encounter (Signed)
Received VM from pt in regards to a refill for Methadone.  Dr [name] completed prescription and I notified pt via phone, she will pick up prescription here at [facility]. No other needs at this time.

## 2018-03-21 MED FILL — METHADONE HCL 5 MG TABS: 5 | 30 days supply | Qty: 270 | Fill #0

## 2018-03-24 ENCOUNTER — Other Ambulatory Visit: Payer: Self-pay | Admitting: Family Medicine

## 2018-04-05 ENCOUNTER — Ambulatory Visit: Payer: Medicaid Other | Admitting: Obstetrics and Gynecology

## 2018-04-12 ENCOUNTER — Ambulatory Visit: Payer: Medicaid Other | Admitting: Obstetrics & Gynecology

## 2018-04-13 ENCOUNTER — Ambulatory Visit: Payer: Medicaid Other | Admitting: Obstetrics & Gynecology

## 2018-04-14 ENCOUNTER — Ambulatory Visit: Payer: Medicaid Other | Admitting: Obstetrics & Gynecology

## 2018-04-17 ENCOUNTER — Telehealth: Payer: Self-pay | Admitting: *Deleted

## 2018-04-17 ENCOUNTER — Other Ambulatory Visit: Payer: Self-pay | Admitting: *Deleted

## 2018-04-17 DIAGNOSIS — Z853 Personal history of malignant neoplasm of breast: Secondary | ICD-10-CM

## 2018-04-17 DIAGNOSIS — G8929 Other chronic pain: Secondary | ICD-10-CM

## 2018-04-17 DIAGNOSIS — I10 Essential (primary) hypertension: Secondary | ICD-10-CM

## 2018-04-17 DIAGNOSIS — M792 Neuralgia and neuritis, unspecified: Secondary | ICD-10-CM

## 2018-04-17 MED ORDER — METHADONE HCL 5 MG PO TABS: 15.0000 mg | ORAL_TABLET | Freq: Three times a day (TID) | ORAL | 0 refills | Status: DC | PRN

## 2018-04-17 NOTE — Telephone Encounter (Signed)
Call received from [name] on provider line option one.  "I need to speak with Dr. Delight Stare nurse."    No signs or symptoms requiring triage at this time.  Call transferred to collaborative per patient request.

## 2018-04-19 MED FILL — METHADONE HCL 5 MG TABS: 5 | 30 days supply | Qty: 270 | Fill #0

## 2018-04-24 ENCOUNTER — Ambulatory Visit (INDEPENDENT_AMBULATORY_CARE_PROVIDER_SITE_OTHER): Payer: Medicaid Other | Admitting: Obstetrics & Gynecology

## 2018-04-24 ENCOUNTER — Encounter (HOSPITAL_COMMUNITY): Payer: Self-pay

## 2018-04-24 ENCOUNTER — Encounter: Payer: Self-pay | Admitting: Obstetrics & Gynecology

## 2018-04-24 VITALS — BP 113/87 | HR 75 | Ht 65.0 in | Wt 216.0 lb

## 2018-04-24 DIAGNOSIS — Z853 Personal history of malignant neoplasm of breast: Secondary | ICD-10-CM

## 2018-04-24 DIAGNOSIS — D219 Benign neoplasm of connective and other soft tissue, unspecified: Secondary | ICD-10-CM | POA: Diagnosis not present

## 2018-04-24 NOTE — Progress Notes (Signed)
History:  57 y.o. G1P0010 here today for eval fro [place]. LMP 2011 Pt was seen by Dr. Rip [name] on 02/22/2018: Pt with known enlarged uterus consistent with uterine fibroids EMBX small fragment suggestive of simple hyperplasia remainder of BX no hyperplasia. She has been cleared by PCP and cardiology. Heme/Onc agrees with BSO at time of surgery as well.   Pt and Dr. Rip [name] desired a pelvic CT prior to her surgery but, this was declined by the ins co. I reviewed her chart as well.  I did a peer to peer with her ins co and understand their declining the study.   The following portions of the patient's history were reviewed and updated as appropriate: allergies, current medications, past family history, past medical history, past social history, past surgical history and problem list.  Review of Systems:  Pertinent items are noted in HPI.    Objective:  Physical Exam Last menstrual period 08/07/2011. BP 113/87   Pulse 75   Ht 5\' 5"  (1.651 m)   Wt 216 lb (98 kg)   LMP 08/07/2011   BMI 35.94 kg/m   CONSTITUTIONAL: Well-developed, well-nourished female in no acute distress.  HENT:  Normocephalic, atraumatic EYES: Conjunctivae and EOM are normal. No scleral icterus.  NECK: Normal range of motion SKIN: Skin is warm and dry. No rash noted. Not diaphoretic.No pallor. [place]: Alert and oriented to person, place, and time. Normal coordination.  Abd: Soft, nontender and nondistended; obese. Well healed unbiblical incision Pelvic: Normal appearing external genitalia; enlarged uterus. Pt does not have good decensus.  Uterus feel long and farily narrow. No CMT, No other palpable masses, no uterine or adnexal tenderness  Labs and Imaging 09/12/2017 CLINICAL DATA:  Paraumbilical mass on exam. Upper pelvic soft tissue mass on recent abdomen ultrasound. Personal history of left breast cancer .  EXAM: TRANSABDOMINAL AND TRANSVAGINAL ULTRASOUND OF PELVIS  TECHNIQUE: Both transabdominal and  transvaginal ultrasound examinations of the pelvis were performed. Transabdominal technique was performed for global imaging of the pelvis including uterus, ovaries, adnexal regions, and pelvic cul-de-sac. It was necessary to proceed with endovaginal exam following the transabdominal exam to visualize the endometrium and ovaries.  COMPARISON:  Abdomen ultrasound on 08/15/2017 and CT on 11/27/2014  FINDINGS: Uterus  Measurements: 15.0 x 8.5 x 12.1 cm. Several uterine fibroids are seen which range in size from 3.9 cm to 7.3 cm in maximum diameter. This corresponds to the mass seen in the upper pelvis on recent abdomen ultrasound.  Endometrium  Thickness: 13 mm.  No focal abnormality visualized.  Right ovary  Measurements: Not directly visualized, however no adnexal mass identified.  Left ovary  Measurements: 3.5 x 2.2 x 2.1 cm. Normal appearance/no adnexal mass.  Other findings  No abnormal free fluid.  IMPRESSION: Enlarged uterus with several fibroids measuring up to 7.3 cm. This corresponds to the mass seen on recent abdomen ultrasound.  No adnexal mass or free fluid identified.  Assessment & Plan:  Sx uterine fibroid causing pain in a PMP pt with h/o breast CA. She desires a robot assisted total laparoscopic hysterectomy with BSO and her oncologist concurs.   Patient desires surgical management with RATH BSO.  The risks of surgery were discussed in detail with the patient including but not limited to: bleeding which may require transfusion or reoperation; infection which may require prolonged hospitalization or re-hospitalization and antibiotic therapy; injury to bowel, bladder, ureters and major vessels or other surrounding organs; need for additional procedures including laparotomy; thromboembolic phenomenon, incisional problems and  other postoperative or anesthesia complications.  Patient was told that the likelihood that her condition and symptoms will be  treated effectively with this surgical management was very high; the postoperative expectations were also discussed in detail. The patient also understands the alternative treatment options which were discussed in full. All questions were answered.  She was told that she will be contacted by our surgical scheduler regarding the time and date of her surgery; routine preoperative instructions of having nothing to eat or drink after midnight on the day prior to surgery and also coming to the hospital 1 1/2 hours prior to her time of surgery were also emphasized.  She was told she may be called for a preoperative appointment about a week prior to surgery and will be given further preoperative instructions at that visit. Printed patient education handouts about the procedure were given to the patient to review at home.  [name], M.D., [name]

## 2018-04-24 NOTE — Patient Instructions (Signed)
Total Laparoscopic Hysterectomy °A total laparoscopic hysterectomy is a minimally invasive surgery to remove your uterus and cervix. This surgery is performed by making several small cuts (incisions) in your abdomen. It can also be done with a thin, lighted tube (laparoscope) inserted into two small incisions in your lower abdomen. Your fallopian tubes and ovaries can be removed (bilateral salpingo-oophorectomy) during this surgery as well. Benefits of minimally invasive surgery include: °· Less pain. °· Less risk of blood loss. °· Less risk of infection. °· Quicker return to normal activities. ° °Tell a health care provider about: °· Any allergies you have. °· All medicines you are taking, including vitamins, herbs, eye drops, creams, and over-the-counter medicines. °· Any problems you or family members have had with anesthetic medicines. °· Any blood disorders you have. °· Any surgeries you have had. °· Any medical conditions you have. °What are the risks? °Generally, this is a safe procedure. However, as with any procedure, complications can occur. Possible complications include: °· Bleeding. °· Blood clots in the legs or lung. °· Infection. °· Injury to surrounding organs. °· Problems with anesthesia. °· Early menopause symptoms (hot flashes, night sweats, insomnia). °· Risk of conversion to an open abdominal incision. ° °What happens before the procedure? °· Ask your health care provider about changing or stopping your regular medicines. °· Do not take aspirin or blood thinners (anticoagulants) for 1 week before the surgery or as told by your health care provider. °· Do not eat or drink anything for 8 hours before the surgery or as told by your health care provider. °· Quit smoking if you smoke. °· Arrange for a ride home after surgery and for someone to help you at home during recovery. °What happens during the procedure? °· You will be given antibiotic medicine. °· An IV tube will be placed in your arm. You  will be given medicine to make you sleep (general anesthetic). °· A gas (carbon dioxide) will be used to inflate your abdomen. This will allow your surgeon to look inside your abdomen, perform your surgery, and treat any other problems found if necessary. °· Three or four small incisions (often less than 1/2 inch) will be made in your abdomen. One of these incisions will be made in the area of your belly button (navel). The laparoscope will be inserted into the incision. Your surgeon will look through the laparoscope while doing your procedure. °· Other surgical instruments will be inserted through the other incisions. °· Your uterus may be removed through your vagina or cut into small pieces and removed through the small incisions. °· Your incisions will be closed. °What happens after the procedure? °· The gas will be released from inside your abdomen. °· You will be taken to the recovery area where a nurse will watch and check your progress. Once you are awake, stable, and taking fluids well, without other problems, you will return to your room or be allowed to go home. °· There is usually minimal discomfort following the surgery because the incisions are so small. °· You will be given pain medicine while you are in the hospital and for when you go home. °This information is not intended to replace advice given to you by your health care provider. Make sure you discuss any questions you have with your health care provider. °Document Released: 10/03/2007 Document Revised: 05/13/2016 Document Reviewed: 06/26/2013 °Elsevier Interactive Patient Education © 2017 [employer] ° °

## 2018-04-29 ENCOUNTER — Other Ambulatory Visit: Payer: Self-pay | Admitting: Family Medicine

## 2018-05-11 ENCOUNTER — Ambulatory Visit: Payer: Medicaid Other | Admitting: Physical Therapy

## 2018-05-12 ENCOUNTER — Encounter (HOSPITAL_COMMUNITY): Payer: Self-pay

## 2018-05-12 NOTE — Pre-Procedure Instructions (Signed)
The following are in epic: Cardiac Clearance Dr. Kathlen Mody 02/08/2018 EKG 02/08/2018 CXR 12/14/2017

## 2018-05-12 NOTE — Patient Instructions (Signed)
Your procedure is scheduled on: Tuesday, May 23, 2018   Surgery Time:  7:30AM-10:40AM   Report to [facility] Main  Entrance    Report to admitting at 5:30 AM   Call this number if you have problems the morning of surgery [phone]   Do not eat food or drink liquids :After Midnight.   Do NOT smoke after Midnight   Take these medicines the morning of surgery with A SIP OF WATER: Allopurinol, Isosorbide, Metoprolol, Methadone if needed                               You may not have any metal on your body including hair pins, jewelry, and body piercings             Do not wear make-up, lotions, powders, perfumes/cologne, or deodorant             Do not wear nail polish.  Do not shave  48 hours prior to surgery.                Do not bring valuables to the hospital. [place].   Contacts, dentures or bridgework may not be worn into surgery.   Leave suitcase in the car. After surgery it may be brought to your room.   Special Instructions: Bring a copy of your healthcare power of attorney and living will documents         the day of surgery if you haven't scanned them in before.              Please read over the following fact sheets you were given:  [facility] - Preparing for Surgery Before surgery, you can play an important role.  Because skin is not sterile, your skin needs to be as free of germs as possible.  You can reduce the number of germs on your skin by washing with CHG (chlorahexidine gluconate) soap before surgery.  CHG is an antiseptic cleaner which kills germs and bonds with the skin to continue killing germs even after washing. Please DO NOT use if you have an allergy to CHG or antibacterial soaps.  If your skin becomes reddened/irritated stop using the CHG and inform your nurse when you arrive at Short Stay. Do not shave (including legs and underarms) for at least 48 hours prior to the first CHG shower.   You may shave your face/neck.  Please follow these instructions carefully:  1.  Shower with CHG Soap the night before surgery and the  morning of surgery.  2.  If you choose to wash your hair, wash your hair first as usual with your normal  shampoo.  3.  After you shampoo, rinse your hair and body thoroughly to remove the shampoo.                             4.  Use CHG as you would any other liquid soap.  You can apply chg directly to the skin and wash.  Gently with a scrungie or clean washcloth.  5.  Apply the CHG Soap to your body ONLY FROM THE NECK DOWN.   Do   not use on face/ open  Wound or open sores. Avoid contact with eyes, ears mouth and   genitals (private parts).                       Wash face,  Genitals (private parts) with your normal soap.             6.  Wash thoroughly, paying special attention to the area where your    surgery  will be performed.  7.  Thoroughly rinse your body with warm water from the neck down.  8.  DO NOT shower/wash with your normal soap after using and rinsing off the CHG Soap.                9.  Pat yourself dry with a clean towel.            10.  Wear clean pajamas.            11.  Place clean sheets on your bed the night of your first shower and do not  sleep with pets. Day of Surgery : Do not apply any lotions/deodorants the morning of surgery.  Please wear clean clothes to the hospital/surgery center.  FAILURE TO FOLLOW THESE INSTRUCTIONS MAY RESULT IN THE CANCELLATION OF YOUR SURGERY  PATIENT SIGNATURE_________________________________  NURSE SIGNATURE__________________________________  ________________________________________________________________________  WHAT IS A BLOOD TRANSFUSION? Blood Transfusion Information  A transfusion is the replacement of blood or some of its parts. Blood is made up of multiple cells which provide different functions.  Red blood cells carry oxygen and are used for blood loss  replacement.  White blood cells fight against infection.  Platelets control bleeding.  Plasma helps clot blood.  Other blood products are available for specialized needs, such as hemophilia or other clotting disorders. BEFORE THE TRANSFUSION  Who gives blood for transfusions?   Healthy volunteers who are fully evaluated to make sure their blood is safe. This is blood bank blood. Transfusion therapy is the safest it has ever been in the practice of medicine. Before blood is taken from a donor, a complete history is taken to make sure that person has no history of diseases nor engages in risky social behavior (examples are intravenous drug use or sexual activity with multiple partners). The donor's travel history is screened to minimize risk of transmitting infections, such as malaria. The donated blood is tested for signs of infectious diseases, such as HIV and hepatitis. The blood is then tested to be sure it is compatible with you in order to minimize the chance of a transfusion reaction. If you or a relative donates blood, this is often done in anticipation of surgery and is not appropriate for emergency situations. It takes many days to process the donated blood. RISKS AND COMPLICATIONS Although transfusion therapy is very safe and saves many lives, the main dangers of transfusion include:   Getting an infectious disease.  Developing a transfusion reaction. This is an allergic reaction to something in the blood you were given. Every precaution is taken to prevent this. The decision to have a blood transfusion has been considered carefully by your caregiver before blood is given. Blood is not given unless the benefits outweigh the risks. AFTER THE TRANSFUSION  Right after receiving a blood transfusion, you will usually feel much better and more energetic. This is especially true if your red blood cells have gotten low (anemic). The transfusion raises the level of the red blood cells which  carry oxygen, and this usually  causes an energy increase.  The nurse administering the transfusion will monitor you carefully for complications. HOME CARE INSTRUCTIONS  No special instructions are needed after a transfusion. You may find your energy is better. Speak with your caregiver about any limitations on activity for underlying diseases you may have. SEEK MEDICAL CARE IF:   Your condition is not improving after your transfusion.  You develop redness or irritation at the intravenous (IV) site. SEEK IMMEDIATE MEDICAL CARE IF:  Any of the following symptoms occur over the next 12 hours:  Shaking chills.  You have a temperature by mouth above 102 F (38.9 C), not controlled by medicine.  Chest, back, or muscle pain.  People around you feel you are not acting correctly or are confused.  Shortness of breath or difficulty breathing.  Dizziness and fainting.  You get a rash or develop hives.  You have a decrease in urine output.  Your urine turns a dark color or changes to pink, red, or brown. Any of the following symptoms occur over the next 10 days:  You have a temperature by mouth above 102 F (38.9 C), not controlled by medicine.  Shortness of breath.  Weakness after normal activity.  The white part of the eye turns yellow (jaundice).  You have a decrease in the amount of urine or are urinating less often.  Your urine turns a dark color or changes to pink, red, or brown. Document Released: 12/03/2000 Document Revised: 02/28/2012 Document Reviewed: 07/22/2008 [facility] Patient Information 2014 [place], [place].  _______________________________________________________________________

## 2018-05-16 ENCOUNTER — Encounter (HOSPITAL_COMMUNITY): Payer: Self-pay

## 2018-05-18 ENCOUNTER — Other Ambulatory Visit: Payer: Self-pay | Admitting: *Deleted

## 2018-05-18 ENCOUNTER — Inpatient Hospital Stay (HOSPITAL_COMMUNITY)
Admission: RE | Admit: 2018-05-18 | Discharge: 2018-05-18 | Disposition: A | Discharge status: Home / Self Care | Payer: Medicaid Other | Source: Ambulatory Visit

## 2018-05-18 ENCOUNTER — Ambulatory Visit: Payer: Medicaid Other | Admitting: Obstetrics & Gynecology

## 2018-05-18 DIAGNOSIS — M792 Neuralgia and neuritis, unspecified: Secondary | ICD-10-CM

## 2018-05-18 DIAGNOSIS — Z853 Personal history of malignant neoplasm of breast: Secondary | ICD-10-CM

## 2018-05-18 DIAGNOSIS — G8929 Other chronic pain: Secondary | ICD-10-CM

## 2018-05-18 DIAGNOSIS — I10 Essential (primary) hypertension: Secondary | ICD-10-CM

## 2018-05-18 HISTORY — DX: Benign neoplasm of connective and other soft tissue, unspecified: D21.9

## 2018-05-18 HISTORY — DX: Cardiomegaly: I51.7

## 2018-05-18 HISTORY — DX: Thrombocytosis, unspecified: D75.839

## 2018-05-18 HISTORY — DX: Essential (hemorrhagic) thrombocythemia: D47.3

## 2018-05-18 MED ORDER — METHADONE HCL 5 MG PO TABS: 15.0000 mg | ORAL_TABLET | Freq: Three times a day (TID) | ORAL | 0 refills | Status: DC | PRN

## 2018-05-29 DIAGNOSIS — M65341 Trigger finger, right ring finger: Secondary | ICD-10-CM | POA: Insufficient documentation

## 2018-05-30 ENCOUNTER — Other Ambulatory Visit: Payer: Self-pay | Admitting: Family Medicine

## 2018-06-05 ENCOUNTER — Ambulatory Visit: Payer: Medicaid Other | Admitting: Physical Therapy

## 2018-06-09 ENCOUNTER — Ambulatory Visit: Payer: Medicaid Other | Admitting: Rehabilitation

## 2018-06-12 NOTE — Progress Notes (Signed)
02-08-18 (Epic) Cardiac Clearance with note that pt should remain on Aspirin. However, if needed Aspirin can be discontinued x 7 days.   02-08-18 (Epic) EKG  12-14-18 (Epic) CXR

## 2018-06-12 NOTE — Patient Instructions (Signed)
DISA RIEDLINGER  06/12/2018   Your procedure is scheduled on:06-20-18    Report to [facility] Main  Entrance    Report to Admitting at 5:30 AM    Call this number if you have problems the morning of surgery [phone]   Remember: Do not eat food or drink liquids :After Midnight.     Take these medicines the morning of surgery with A SIP OF WATER: Tamoxifen, Allopurinol, Isosorbide, Metoprolol, and Methadone, prn. You may also bring and use your eyedrops as needed.                                 You may not have any metal on your body including hair pins and              piercings  Do not wear jewelry, make-up, lotions, powders or perfumes, deodorant             Do not wear nail polish.  Do not shave  48 hours prior to surgery.                 Do not bring valuables to the hospital. [place].  Contacts, dentures or bridgework may not be worn into surgery.  Leave suitcase in the car. After surgery it may be brought to your room.     Special Instructions: N/A              Please read over the following fact sheets you were given: _____________________________________________________________________             [facility] - Preparing for Surgery Before surgery, you can play an important role.  Because skin is not sterile, your skin needs to be as free of germs as possible.  You can reduce the number of germs on your skin by washing with CHG (chlorahexidine gluconate) soap before surgery.  CHG is an antiseptic cleaner which kills germs and bonds with the skin to continue killing germs even after washing. Please DO NOT use if you have an allergy to CHG or antibacterial soaps.  If your skin becomes reddened/irritated stop using the CHG and inform your nurse when you arrive at Short Stay. Do not shave (including legs and underarms) for at least 48 hours prior to the first CHG shower.  You may shave your  face/neck. Please follow these instructions carefully:  1.  Shower with CHG Soap the night before surgery and the  morning of Surgery.  2.  If you choose to wash your hair, wash your hair first as usual with your  normal  shampoo.  3.  After you shampoo, rinse your hair and body thoroughly to remove the  shampoo.                           4.  Use CHG as you would any other liquid soap.  You can apply chg directly  to the skin and wash                       Gently with a scrungie or clean washcloth.  5.  Apply the CHG Soap to your body ONLY FROM THE NECK  DOWN.   Do not use on face/ open                           Wound or open sores. Avoid contact with eyes, ears mouth and genitals (private parts).                       Wash face,  Genitals (private parts) with your normal soap.             6.  Wash thoroughly, paying special attention to the area where your surgery  will be performed.  7.  Thoroughly rinse your body with warm water from the neck down.  8.  DO NOT shower/wash with your normal soap after using and rinsing off  the CHG Soap.                9.  Pat yourself dry with a clean towel.            10.  Wear clean pajamas.            11.  Place clean sheets on your bed the night of your first shower and do not  sleep with pets. Day of Surgery : Do not apply any lotions/deodorants the morning of surgery.  Please wear clean clothes to the hospital/surgery center.  FAILURE TO FOLLOW THESE INSTRUCTIONS MAY RESULT IN THE CANCELLATION OF YOUR SURGERY PATIENT SIGNATURE_________________________________  NURSE SIGNATURE__________________________________  ________________________________________________________________________  WHAT IS A BLOOD TRANSFUSION? Blood Transfusion Information  A transfusion is the replacement of blood or some of its parts. Blood is made up of multiple cells which provide different functions.  Red blood cells carry oxygen and are used for blood loss  replacement.  White blood cells fight against infection.  Platelets control bleeding.  Plasma helps clot blood.  Other blood products are available for specialized needs, such as hemophilia or other clotting disorders. BEFORE THE TRANSFUSION  Who gives blood for transfusions?   Healthy volunteers who are fully evaluated to make sure their blood is safe. This is blood bank blood. Transfusion therapy is the safest it has ever been in the practice of medicine. Before blood is taken from a donor, a complete history is taken to make sure that person has no history of diseases nor engages in risky social behavior (examples are intravenous drug use or sexual activity with multiple partners). The donor's travel history is screened to minimize risk of transmitting infections, such as malaria. The donated blood is tested for signs of infectious diseases, such as HIV and hepatitis. The blood is then tested to be sure it is compatible with you in order to minimize the chance of a transfusion reaction. If you or a relative donates blood, this is often done in anticipation of surgery and is not appropriate for emergency situations. It takes many days to process the donated blood. RISKS AND COMPLICATIONS Although transfusion therapy is very safe and saves many lives, the main dangers of transfusion include:   Getting an infectious disease.  Developing a transfusion reaction. This is an allergic reaction to something in the blood you were given. Every precaution is taken to prevent this. The decision to have a blood transfusion has been considered carefully by your caregiver before blood is given. Blood is not given unless the benefits outweigh the risks. AFTER THE TRANSFUSION  Right after receiving a blood transfusion, you will usually feel much better and more energetic.  This is especially true if your red blood cells have gotten low (anemic). The transfusion raises the level of the red blood cells which  carry oxygen, and this usually causes an energy increase.  The nurse administering the transfusion will monitor you carefully for complications. HOME CARE INSTRUCTIONS  No special instructions are needed after a transfusion. You may find your energy is better. Speak with your caregiver about any limitations on activity for underlying diseases you may have. SEEK MEDICAL CARE IF:   Your condition is not improving after your transfusion.  You develop redness or irritation at the intravenous (IV) site. SEEK IMMEDIATE MEDICAL CARE IF:  Any of the following symptoms occur over the next 12 hours:  Shaking chills.  You have a temperature by mouth above 102 F (38.9 C), not controlled by medicine.  Chest, back, or muscle pain.  People around you feel you are not acting correctly or are confused.  Shortness of breath or difficulty breathing.  Dizziness and fainting.  You get a rash or develop hives.  You have a decrease in urine output.  Your urine turns a dark color or changes to pink, red, or brown. Any of the following symptoms occur over the next 10 days:  You have a temperature by mouth above 102 F (38.9 C), not controlled by medicine.  Shortness of breath.  Weakness after normal activity.  The white part of the eye turns yellow (jaundice).  You have a decrease in the amount of urine or are urinating less often.  Your urine turns a dark color or changes to pink, red, or brown. Document Released: 12/03/2000 Document Revised: 02/28/2012 Document Reviewed: 07/22/2008 [facility] Patient Information 2014 [place], [place].  _______________________________________________________________________

## 2018-06-13 ENCOUNTER — Telehealth: Payer: Self-pay

## 2018-06-13 NOTE — Telephone Encounter (Signed)
Received call from pt that she will be due for refill for her Methadone on 06/18/18. She will be in town on Thursday 6/27 and would like to pick it up then. Will notify Dr.Magrinat's nurse that day and told pt to call prior to pick up, to make sure it is ready.

## 2018-06-14 ENCOUNTER — Encounter (HOSPITAL_COMMUNITY)
Admission: RE | Admit: 2018-06-14 | Discharge: 2018-06-14 | Disposition: A | Discharge status: Home / Self Care | Payer: Medicaid Other | Source: Ambulatory Visit | Attending: Family Medicine | Admitting: Family Medicine

## 2018-06-15 ENCOUNTER — Other Ambulatory Visit: Payer: Self-pay | Admitting: Obstetrics & Gynecology

## 2018-06-15 ENCOUNTER — Encounter (HOSPITAL_COMMUNITY): Payer: Self-pay

## 2018-06-15 ENCOUNTER — Other Ambulatory Visit: Payer: Self-pay

## 2018-06-15 ENCOUNTER — Encounter (INDEPENDENT_AMBULATORY_CARE_PROVIDER_SITE_OTHER): Payer: Self-pay

## 2018-06-15 ENCOUNTER — Other Ambulatory Visit: Payer: Self-pay | Admitting: *Deleted

## 2018-06-15 ENCOUNTER — Encounter (HOSPITAL_COMMUNITY)
Admission: RE | Admit: 2018-06-15 | Discharge: 2018-06-15 | Disposition: A | Discharge status: Home / Self Care | Payer: Medicaid Other | Source: Ambulatory Visit | Attending: Obstetrics & Gynecology | Admitting: Obstetrics & Gynecology

## 2018-06-15 DIAGNOSIS — D259 Leiomyoma of uterus, unspecified: Secondary | ICD-10-CM | POA: Diagnosis not present

## 2018-06-15 DIAGNOSIS — G8929 Other chronic pain: Secondary | ICD-10-CM

## 2018-06-15 DIAGNOSIS — Z01812 Encounter for preprocedural laboratory examination: Secondary | ICD-10-CM | POA: Insufficient documentation

## 2018-06-15 DIAGNOSIS — Z853 Personal history of malignant neoplasm of breast: Secondary | ICD-10-CM | POA: Diagnosis not present

## 2018-06-15 DIAGNOSIS — N939 Abnormal uterine and vaginal bleeding, unspecified: Secondary | ICD-10-CM | POA: Insufficient documentation

## 2018-06-15 DIAGNOSIS — M792 Neuralgia and neuritis, unspecified: Secondary | ICD-10-CM

## 2018-06-15 DIAGNOSIS — E876 Hypokalemia: Secondary | ICD-10-CM

## 2018-06-15 DIAGNOSIS — I10 Essential (primary) hypertension: Secondary | ICD-10-CM

## 2018-06-15 HISTORY — DX: Effusion, left ankle: M25.472

## 2018-06-15 HISTORY — DX: Effusion, left ankle: M25.471

## 2018-06-15 HISTORY — DX: Asymptomatic varicose veins of unspecified lower extremity: I83.90

## 2018-06-15 HISTORY — DX: Trigger finger, unspecified finger: M65.30

## 2018-06-15 LAB — BASIC METABOLIC PANEL
Anion gap: 9 (ref 5–15)
BUN: 16 mg/dL (ref 6–20)
CHLORIDE: 107 mmol/L (ref 98–111)
CO2: 26 mmol/L (ref 22–32)
CREATININE: 0.99 mg/dL (ref 0.44–1.00)
Calcium: 9.2 mg/dL (ref 8.9–10.3)
GFR calc Af Amer: >60 mL/min (ref >60)
Glucose, Bld: 130 mg/dL — ABNORMAL HIGH (ref 70–99)
Potassium: 2.8 mmol/L — ABNORMAL LOW (ref 3.5–5.1)
SODIUM: 142 mmol/L (ref 135–145)

## 2018-06-15 LAB — CBC
HCT: 42 % (ref 36.0–46.0)
Hemoglobin: 14 g/dL (ref 12.0–15.0)
MCH: 29.5 pg (ref 26.0–34.0)
MCHC: 33.3 g/dL (ref 30.0–36.0)
MCV: 88.4 fL (ref 78.0–100.0)
PLATELETS: 434 10*3/uL — AB (ref 150–400)
RBC: 4.75 MIL/uL (ref 3.87–5.11)
RDW: 15.9 % — AB (ref 11.5–15.5)
WBC: 8.1 10*3/uL (ref 4.0–10.5)

## 2018-06-15 LAB — PREGNANCY, URINE: Preg Test, Ur: NEGATIVE

## 2018-06-15 MED ORDER — METHADONE HCL 5 MG PO TABS: 15.0000 mg | ORAL_TABLET | Freq: Three times a day (TID) | ORAL | 0 refills | Status: DC | PRN

## 2018-06-15 MED FILL — METHADONE HCL 5 MG TABLET: 5 | 30 days supply | Qty: 270 | Fill #0

## 2018-06-15 NOTE — Patient Instructions (Addendum)
[name]  06/15/2018   Your procedure is scheduled on: 06-20-18   Report to [facility] Main  Entrance    Report to admitting at 11:15AM    Call this number if you have problems the morning of surgery [phone]     Remember: Do not eat food After Midnight. You may have clear liquids 7:15am day of surgery . Nothing after 7:15am !       CLEAR LIQUID DIET   Foods Allowed                                                                     Foods Excluded  Coffee and tea, regular and decaf                             liquids that you cannot  Plain Jell-O in any flavor                                             see through such as: Fruit ices (not with fruit pulp)                                     milk, soups, orange juice  Iced Popsicles                                    All solid food Carbonated beverages, regular and diet                                    Cranberry, grape and apple juices Sports drinks like Gatorade Lightly seasoned clear broth or consume(fat free) Sugar, honey syrup  Sample Menu Breakfast                                Lunch                                     Supper Cranberry juice                    Beef broth                            Chicken broth Jell-O                                     Grape juice                           Apple juice Coffee  or tea                        Jell-O                                      Popsicle                                                Coffee or tea                        Coffee or tea  _____________________________________________________________________     Take these medicines the morning of surgery with A SIP OF WATER: none                                  You may not have any metal on your body including hair pins and              piercings  Do not wear jewelry, make-up, lotions, powders or perfumes, deodorant             Do not wear nail polish.  Do not shave  48 hours  prior to surgery.              Men may shave face and neck.   Do not bring valuables to the hospital. [place].  Contacts, dentures or bridgework may not be worn into surgery.  Leave suitcase in the car. After surgery it may be brought to your room.                Please read over the following fact sheets you were given: _____________________________________________________________________             [facility] - Preparing for Surgery Before surgery, you can play an important role.  Because skin is not sterile, your skin needs to be as free of germs as possible.  You can reduce the number of germs on your skin by washing with CHG (chlorahexidine gluconate) soap before surgery.  CHG is an antiseptic cleaner which kills germs and bonds with the skin to continue killing germs even after washing. Please DO NOT use if you have an allergy to CHG or antibacterial soaps.  If your skin becomes reddened/irritated stop using the CHG and inform your nurse when you arrive at Short Stay. Do not shave (including legs and underarms) for at least 48 hours prior to the first CHG shower.  You may shave your face/neck. Please follow these instructions carefully:  1.  Shower with CHG Soap the night before surgery and the  morning of Surgery.  2.  If you choose to wash your hair, wash your hair first as usual with your  normal  shampoo.  3.  After you shampoo, rinse your hair and body thoroughly to remove the  shampoo.                           4.  Use CHG as you would any other liquid soap.  You can apply chg directly  to the skin and wash                       Gently with a scrungie or clean washcloth.  5.  Apply the CHG Soap to your body ONLY FROM THE NECK DOWN.   Do not use on face/ open                           Wound or open sores. Avoid contact with eyes, ears mouth and genitals (private parts).                       Wash face,  Genitals (private parts)  with your normal soap.             6.  Wash thoroughly, paying special attention to the area where your surgery  will be performed.  7.  Thoroughly rinse your body with warm water from the neck down.  8.  DO NOT shower/wash with your normal soap after using and rinsing off  the CHG Soap.                9.  Pat yourself dry with a clean towel.            10.  Wear clean pajamas.            11.  Place clean sheets on your bed the night of your first shower and do not  sleep with pets. Day of Surgery : Do not apply any lotions/deodorants the morning of surgery.  Please wear clean clothes to the hospital/surgery center.  FAILURE TO FOLLOW THESE INSTRUCTIONS MAY RESULT IN THE CANCELLATION OF YOUR SURGERY PATIENT SIGNATURE_________________________________  NURSE SIGNATURE__________________________________  ________________________________________________________________________

## 2018-06-15 NOTE — Progress Notes (Signed)
Pt has low potassium.  Need magnesium level.  Will add on to specimen drawn today.

## 2018-06-15 NOTE — Progress Notes (Signed)
Bmp routed via epic to dr Ihor Dow

## 2018-06-16 ENCOUNTER — Telehealth: Payer: Self-pay | Admitting: *Deleted

## 2018-06-16 ENCOUNTER — Other Ambulatory Visit: Payer: Self-pay | Admitting: Obstetrics & Gynecology

## 2018-06-16 LAB — MAGNESIUM: MAGNESIUM: 2 mg/dL (ref 1.7–2.4)

## 2018-06-16 NOTE — Progress Notes (Signed)
Pt K+ is 2.6.  She has taken her potassium every day except once earlier in the month.   Increase potassium to 40 meq bid and recheck day of surgery.  Spoke with patient and she will use existing medication to increase potassium.  Also, she was recently diagnosed with varicose veins.  She would like to dr. Ihor Dow to examine them on day of surgery.

## 2018-06-16 NOTE — Telephone Encounter (Signed)
Per Dr. Roseanne Kaufman request called patient to inquire about whether she is taking her potassium as directed.  Patient states she has taken it appropriately except on 06/10/18 she ran out and had only 1 pill to take which she did.  On 06/11/18 she wasn't able to go to the pharmacy and did not take medication this day.  She went to pharmacy on 06/12/18 picked up medication and is back to taking medication as directed.  I told patient I would let the provider know.  I encouraged her to be certain she takes the medication daily as directed.

## 2018-06-19 DIAGNOSIS — I220 Subsequent ST elevation (STEMI) myocardial infarction of anterior wall: Secondary | ICD-10-CM | POA: Diagnosis not present

## 2018-06-20 ENCOUNTER — Ambulatory Visit (HOSPITAL_COMMUNITY)
Admission: RE | Admit: 2018-06-20 | Payer: Medicaid Other | Source: Ambulatory Visit | Admitting: Obstetrics & Gynecology

## 2018-06-20 ENCOUNTER — Encounter (HOSPITAL_COMMUNITY): Payer: Self-pay

## 2018-06-20 ENCOUNTER — Encounter (HOSPITAL_COMMUNITY): Admission: RE | Payer: Self-pay | Source: Ambulatory Visit

## 2018-06-20 DIAGNOSIS — I220 Subsequent ST elevation (STEMI) myocardial infarction of anterior wall: Secondary | ICD-10-CM | POA: Diagnosis not present

## 2018-06-20 LAB — TYPE AND SCREEN
ABO/RH(D): B POS
ANTIBODY SCREEN: NEGATIVE

## 2018-06-20 SURGERY — ROBOTIC ASSISTED TOTAL HYSTERECTOMY WITH BILATERAL SALPINGO OOPHORECTOMY
Anesthesia: Choice | Laterality: Bilateral

## 2018-06-21 DIAGNOSIS — I220 Subsequent ST elevation (STEMI) myocardial infarction of anterior wall: Secondary | ICD-10-CM | POA: Diagnosis not present

## 2018-06-22 DIAGNOSIS — I220 Subsequent ST elevation (STEMI) myocardial infarction of anterior wall: Secondary | ICD-10-CM | POA: Diagnosis not present

## 2018-06-23 ENCOUNTER — Other Ambulatory Visit: Payer: Self-pay | Admitting: Family Medicine

## 2018-06-23 DIAGNOSIS — G8929 Other chronic pain: Secondary | ICD-10-CM

## 2018-06-23 DIAGNOSIS — Z853 Personal history of malignant neoplasm of breast: Secondary | ICD-10-CM

## 2018-06-23 DIAGNOSIS — M792 Neuralgia and neuritis, unspecified: Secondary | ICD-10-CM

## 2018-06-23 DIAGNOSIS — I10 Essential (primary) hypertension: Secondary | ICD-10-CM

## 2018-06-23 DIAGNOSIS — I220 Subsequent ST elevation (STEMI) myocardial infarction of anterior wall: Secondary | ICD-10-CM | POA: Diagnosis not present

## 2018-06-24 DIAGNOSIS — I220 Subsequent ST elevation (STEMI) myocardial infarction of anterior wall: Secondary | ICD-10-CM | POA: Diagnosis not present

## 2018-06-25 DIAGNOSIS — I220 Subsequent ST elevation (STEMI) myocardial infarction of anterior wall: Secondary | ICD-10-CM | POA: Diagnosis not present

## 2018-06-26 ENCOUNTER — Other Ambulatory Visit: Payer: Self-pay | Admitting: *Deleted

## 2018-06-26 ENCOUNTER — Telehealth: Payer: Self-pay | Admitting: *Deleted

## 2018-06-26 DIAGNOSIS — I890 Lymphedema, not elsewhere classified: Secondary | ICD-10-CM

## 2018-06-26 DIAGNOSIS — I220 Subsequent ST elevation (STEMI) myocardial infarction of anterior wall: Secondary | ICD-10-CM | POA: Diagnosis not present

## 2018-06-26 DIAGNOSIS — C50412 Malignant neoplasm of upper-outer quadrant of left female breast: Secondary | ICD-10-CM

## 2018-06-26 DIAGNOSIS — Z171 Estrogen receptor negative status [ER-]: Secondary | ICD-10-CM

## 2018-06-26 NOTE — Telephone Encounter (Signed)
This RN spoke with pt per VM - with pt requesting an appointment with MD due to ongoing headaches not relieved with tylenol or antihistamines.  Erabella states she headaches started several weeks ago and are unrelieved with use of above ( note pt is also on methadone for chronic pain ).  She states she is having increased fatigue but denies any lightheadedness or dizziness.  Migdalia did state " my girlfriend had these kind of headaches and they found she has a tumor in her brain "  Gabryel stated she also needs a referral for the lymphedema clinic to follow up with prior missed appointments.  This RN will review pt's concern regarding headaches with MD for further recommendation- referral placed for lymphedema PT.

## 2018-06-27 DIAGNOSIS — I220 Subsequent ST elevation (STEMI) myocardial infarction of anterior wall: Secondary | ICD-10-CM | POA: Diagnosis not present
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
## 2018-06-28 ENCOUNTER — Telehealth: Payer: Self-pay | Admitting: Oncology

## 2018-06-28 ENCOUNTER — Inpatient Hospital Stay: Payer: Medicaid Other | Attending: Oncology | Admitting: Oncology

## 2018-06-28 ENCOUNTER — Other Ambulatory Visit: Payer: Self-pay | Admitting: Internal Medicine

## 2018-06-28 VITALS — BP 141/83 | HR 66 | Temp 97.3°F | Resp 18 | Ht 65.0 in | Wt 219.3 lb

## 2018-06-28 DIAGNOSIS — C50812 Malignant neoplasm of overlapping sites of left female breast: Secondary | ICD-10-CM | POA: Diagnosis not present

## 2018-06-28 DIAGNOSIS — I220 Subsequent ST elevation (STEMI) myocardial infarction of anterior wall: Secondary | ICD-10-CM | POA: Diagnosis not present

## 2018-06-28 DIAGNOSIS — C50911 Malignant neoplasm of unspecified site of right female breast: Secondary | ICD-10-CM | POA: Diagnosis not present

## 2018-06-28 DIAGNOSIS — R51 Headache: Secondary | ICD-10-CM | POA: Insufficient documentation

## 2018-06-28 DIAGNOSIS — Z9221 Personal history of antineoplastic chemotherapy: Secondary | ICD-10-CM | POA: Insufficient documentation

## 2018-06-28 DIAGNOSIS — C773 Secondary and unspecified malignant neoplasm of axilla and upper limb lymph nodes: Secondary | ICD-10-CM | POA: Insufficient documentation

## 2018-06-28 DIAGNOSIS — Z9013 Acquired absence of bilateral breasts and nipples: Secondary | ICD-10-CM | POA: Diagnosis not present

## 2018-06-28 DIAGNOSIS — Z170 Estrogen receptor positive status [ER+]: Secondary | ICD-10-CM | POA: Insufficient documentation

## 2018-06-28 DIAGNOSIS — Z79810 Long term (current) use of selective estrogen receptor modulators (SERMs): Secondary | ICD-10-CM | POA: Diagnosis not present

## 2018-06-28 DIAGNOSIS — Z923 Personal history of irradiation: Secondary | ICD-10-CM | POA: Insufficient documentation

## 2018-06-28 DIAGNOSIS — D473 Essential (hemorrhagic) thrombocythemia: Secondary | ICD-10-CM | POA: Diagnosis not present

## 2018-06-28 DIAGNOSIS — C50811 Malignant neoplasm of overlapping sites of right female breast: Secondary | ICD-10-CM

## 2018-06-28 MED ORDER — TAMOXIFEN CITRATE 20 MG PO TABS: 20.0000 mg | ORAL_TABLET | Freq: Every day | ORAL | 4 refills | Status: DC

## 2018-06-28 NOTE — Telephone Encounter (Signed)
Gave patient avs and calendar of upcoming nov and may appts.

## 2018-06-29 DIAGNOSIS — I220 Subsequent ST elevation (STEMI) myocardial infarction of anterior wall: Secondary | ICD-10-CM | POA: Diagnosis not present

## 2018-06-30 DIAGNOSIS — I220 Subsequent ST elevation (STEMI) myocardial infarction of anterior wall: Secondary | ICD-10-CM | POA: Diagnosis not present

## 2018-07-01 DIAGNOSIS — I220 Subsequent ST elevation (STEMI) myocardial infarction of anterior wall: Secondary | ICD-10-CM | POA: Diagnosis not present

## 2018-07-02 DIAGNOSIS — I220 Subsequent ST elevation (STEMI) myocardial infarction of anterior wall: Secondary | ICD-10-CM | POA: Diagnosis not present

## 2018-07-03 ENCOUNTER — Ambulatory Visit (HOSPITAL_COMMUNITY)
Admission: RE | Admit: 2018-07-03 | Discharge: 2018-07-03 | Disposition: A | Discharge status: Home / Self Care | Payer: Medicaid Other | Source: Ambulatory Visit | Attending: Oncology | Admitting: Oncology

## 2018-07-03 DIAGNOSIS — I220 Subsequent ST elevation (STEMI) myocardial infarction of anterior wall: Secondary | ICD-10-CM | POA: Diagnosis not present

## 2018-07-03 DIAGNOSIS — C50919 Malignant neoplasm of unspecified site of unspecified female breast: Secondary | ICD-10-CM | POA: Diagnosis not present

## 2018-07-03 DIAGNOSIS — R51 Headache: Secondary | ICD-10-CM | POA: Diagnosis not present

## 2018-07-03 DIAGNOSIS — C50811 Malignant neoplasm of overlapping sites of right female breast: Secondary | ICD-10-CM | POA: Insufficient documentation

## 2018-07-03 MED ORDER — GADOBENATE DIMEGLUMINE 529 MG/ML IV SOLN
20.0000 mL | Freq: Once | INTRAVENOUS | Status: AC | PRN
  Administered 2018-07-03: 20 mL via INTRAVENOUS

## 2018-07-04 DIAGNOSIS — I220 Subsequent ST elevation (STEMI) myocardial infarction of anterior wall: Secondary | ICD-10-CM | POA: Diagnosis not present

## 2018-07-05 DIAGNOSIS — I220 Subsequent ST elevation (STEMI) myocardial infarction of anterior wall: Secondary | ICD-10-CM | POA: Diagnosis not present

## 2018-07-06 DIAGNOSIS — I220 Subsequent ST elevation (STEMI) myocardial infarction of anterior wall: Secondary | ICD-10-CM | POA: Diagnosis not present

## 2018-07-07 DIAGNOSIS — I220 Subsequent ST elevation (STEMI) myocardial infarction of anterior wall: Secondary | ICD-10-CM | POA: Diagnosis not present

## 2018-07-08 DIAGNOSIS — I220 Subsequent ST elevation (STEMI) myocardial infarction of anterior wall: Secondary | ICD-10-CM | POA: Diagnosis not present

## 2018-07-09 DIAGNOSIS — I220 Subsequent ST elevation (STEMI) myocardial infarction of anterior wall: Secondary | ICD-10-CM | POA: Diagnosis not present

## 2018-07-10 DIAGNOSIS — I220 Subsequent ST elevation (STEMI) myocardial infarction of anterior wall: Secondary | ICD-10-CM | POA: Diagnosis not present

## 2018-07-11 DIAGNOSIS — I220 Subsequent ST elevation (STEMI) myocardial infarction of anterior wall: Secondary | ICD-10-CM | POA: Diagnosis not present

## 2018-07-12 DIAGNOSIS — I220 Subsequent ST elevation (STEMI) myocardial infarction of anterior wall: Secondary | ICD-10-CM | POA: Diagnosis not present

## 2018-07-13 ENCOUNTER — Other Ambulatory Visit: Payer: Self-pay | Admitting: *Deleted

## 2018-07-13 DIAGNOSIS — G8929 Other chronic pain: Secondary | ICD-10-CM

## 2018-07-13 DIAGNOSIS — Z853 Personal history of malignant neoplasm of breast: Secondary | ICD-10-CM

## 2018-07-13 DIAGNOSIS — M792 Neuralgia and neuritis, unspecified: Secondary | ICD-10-CM

## 2018-07-13 DIAGNOSIS — I220 Subsequent ST elevation (STEMI) myocardial infarction of anterior wall: Secondary | ICD-10-CM | POA: Diagnosis not present

## 2018-07-13 DIAGNOSIS — I10 Essential (primary) hypertension: Secondary | ICD-10-CM

## 2018-07-13 MED ORDER — METHADONE HCL 5 MG PO TABS: 15.0000 mg | ORAL_TABLET | Freq: Three times a day (TID) | ORAL | 0 refills | Status: DC | PRN

## 2018-07-14 DIAGNOSIS — I220 Subsequent ST elevation (STEMI) myocardial infarction of anterior wall: Secondary | ICD-10-CM | POA: Diagnosis not present

## 2018-07-14 MED FILL — METHADONE HCL 5 MG TABLET: 5 | 30 days supply | Qty: 270 | Fill #0

## 2018-07-15 DIAGNOSIS — I220 Subsequent ST elevation (STEMI) myocardial infarction of anterior wall: Secondary | ICD-10-CM | POA: Diagnosis not present

## 2018-07-16 DIAGNOSIS — I220 Subsequent ST elevation (STEMI) myocardial infarction of anterior wall: Secondary | ICD-10-CM | POA: Diagnosis not present

## 2018-07-17 DIAGNOSIS — I220 Subsequent ST elevation (STEMI) myocardial infarction of anterior wall: Secondary | ICD-10-CM | POA: Diagnosis not present

## 2018-07-18 DIAGNOSIS — I220 Subsequent ST elevation (STEMI) myocardial infarction of anterior wall: Secondary | ICD-10-CM | POA: Diagnosis not present

## 2018-07-19 ENCOUNTER — Inpatient Hospital Stay (HOSPITAL_COMMUNITY)
Admission: RE | Admit: 2018-07-19 | Discharge: 2018-07-19 | Disposition: A | Discharge status: Home / Self Care | Payer: Medicaid Other | Source: Ambulatory Visit

## 2018-07-19 DIAGNOSIS — I220 Subsequent ST elevation (STEMI) myocardial infarction of anterior wall: Secondary | ICD-10-CM | POA: Diagnosis not present

## 2018-07-20 DIAGNOSIS — I220 Subsequent ST elevation (STEMI) myocardial infarction of anterior wall: Secondary | ICD-10-CM | POA: Diagnosis not present

## 2018-07-21 DIAGNOSIS — I220 Subsequent ST elevation (STEMI) myocardial infarction of anterior wall: Secondary | ICD-10-CM | POA: Diagnosis not present

## 2018-07-21 NOTE — Patient Instructions (Signed)
BLANCH STANG  07/21/2018   Your procedure is scheduled on: 07-25-18   Report to [facility] Main  Entrance    Report to admitting at 5:30AM    Call this number if you have problems the morning of surgery [phone]     Remember: Do not eat food or drink liquids :After Midnight.     Take these medicines the morning of surgery with A SIP OF WATER: METHADONE                                 You may not have any metal on your body including hair pins and              piercings  Do not wear jewelry, make-up, lotions, powders or perfumes, deodorant             Do not wear nail polish.  Do not shave  48 hours prior to surgery.             Do not bring valuables to the hospital. [place].  Contacts, dentures or bridgework may not be worn into surgery.  Leave suitcase in the car. After surgery it may be brought to your room.                 Please read over the following fact sheets you were given: _____________________________________________________________________             [facility] - Preparing for Surgery Before surgery, you can play an important role.  Because skin is not sterile, your skin needs to be as free of germs as possible.  You can reduce the number of germs on your skin by washing with CHG (chlorahexidine gluconate) soap before surgery.  CHG is an antiseptic cleaner which kills germs and bonds with the skin to continue killing germs even after washing. Please DO NOT use if you have an allergy to CHG or antibacterial soaps.  If your skin becomes reddened/irritated stop using the CHG and inform your nurse when you arrive at Short Stay. Do not shave (including legs and underarms) for at least 48 hours prior to the first CHG shower.  You may shave your face/neck. Please follow these instructions carefully:  1.  Shower with CHG Soap the night before surgery and the  morning of Surgery.  2.  If  you choose to wash your hair, wash your hair first as usual with your  normal  shampoo.  3.  After you shampoo, rinse your hair and body thoroughly to remove the  shampoo.                           4.  Use CHG as you would any other liquid soap.  You can apply chg directly  to the skin and wash                       Gently with a scrungie or clean washcloth.  5.  Apply the CHG Soap to your body ONLY FROM THE NECK DOWN.   Do not use on face/ open  Wound or open sores. Avoid contact with eyes, ears mouth and genitals (private parts).                       Wash face,  Genitals (private parts) with your normal soap.             6.  Wash thoroughly, paying special attention to the area where your surgery  will be performed.  7.  Thoroughly rinse your body with warm water from the neck down.  8.  DO NOT shower/wash with your normal soap after using and rinsing off  the CHG Soap.                9.  Pat yourself dry with a clean towel.            10.  Wear clean pajamas.            11.  Place clean sheets on your bed the night of your first shower and do not  sleep with pets. Day of Surgery : Do not apply any lotions/deodorants the morning of surgery.  Please wear clean clothes to the hospital/surgery center.  FAILURE TO FOLLOW THESE INSTRUCTIONS MAY RESULT IN THE CANCELLATION OF YOUR SURGERY PATIENT SIGNATURE_________________________________  NURSE SIGNATURE__________________________________  ________________________________________________________________________

## 2018-07-21 NOTE — Progress Notes (Signed)
EKG 02-08-18 Epic   CXR 12-14-17 Epic

## 2018-07-22 DIAGNOSIS — I220 Subsequent ST elevation (STEMI) myocardial infarction of anterior wall: Secondary | ICD-10-CM | POA: Diagnosis not present

## 2018-07-23 DIAGNOSIS — I220 Subsequent ST elevation (STEMI) myocardial infarction of anterior wall: Secondary | ICD-10-CM | POA: Diagnosis not present

## 2018-07-24 ENCOUNTER — Encounter (HOSPITAL_COMMUNITY)
Admission: RE | Admit: 2018-07-24 | Discharge: 2018-07-24 | Disposition: A | Discharge status: Home / Self Care | Payer: Medicaid Other | Source: Ambulatory Visit | Attending: Obstetrics & Gynecology | Admitting: Obstetrics & Gynecology

## 2018-07-24 DIAGNOSIS — I220 Subsequent ST elevation (STEMI) myocardial infarction of anterior wall: Secondary | ICD-10-CM | POA: Diagnosis not present

## 2018-07-25 ENCOUNTER — Ambulatory Visit (HOSPITAL_COMMUNITY)
Admission: RE | Admit: 2018-07-25 | Payer: Medicaid Other | Source: Ambulatory Visit | Admitting: Obstetrics & Gynecology

## 2018-07-25 ENCOUNTER — Encounter (HOSPITAL_COMMUNITY): Admission: RE | Payer: Self-pay | Source: Ambulatory Visit

## 2018-07-25 DIAGNOSIS — I220 Subsequent ST elevation (STEMI) myocardial infarction of anterior wall: Secondary | ICD-10-CM | POA: Diagnosis not present

## 2018-07-25 SURGERY — HYSTERECTOMY, TOTAL, ROBOT-ASSISTED, LAPAROSCOPIC, WITH BILATERAL SALPINGO-OOPHORECTOMY
Anesthesia: Choice | Laterality: Bilateral

## 2018-07-26 DIAGNOSIS — I220 Subsequent ST elevation (STEMI) myocardial infarction of anterior wall: Secondary | ICD-10-CM | POA: Diagnosis not present

## 2018-07-27 DIAGNOSIS — I220 Subsequent ST elevation (STEMI) myocardial infarction of anterior wall: Secondary | ICD-10-CM | POA: Diagnosis not present

## 2018-07-28 DIAGNOSIS — I220 Subsequent ST elevation (STEMI) myocardial infarction of anterior wall: Secondary | ICD-10-CM | POA: Diagnosis not present

## 2018-07-29 DIAGNOSIS — I220 Subsequent ST elevation (STEMI) myocardial infarction of anterior wall: Secondary | ICD-10-CM | POA: Diagnosis not present

## 2018-07-30 DIAGNOSIS — I220 Subsequent ST elevation (STEMI) myocardial infarction of anterior wall: Secondary | ICD-10-CM | POA: Diagnosis not present

## 2018-07-31 DIAGNOSIS — I220 Subsequent ST elevation (STEMI) myocardial infarction of anterior wall: Secondary | ICD-10-CM | POA: Diagnosis not present

## 2018-08-01 DIAGNOSIS — I220 Subsequent ST elevation (STEMI) myocardial infarction of anterior wall: Secondary | ICD-10-CM | POA: Diagnosis not present

## 2018-08-02 DIAGNOSIS — I220 Subsequent ST elevation (STEMI) myocardial infarction of anterior wall: Secondary | ICD-10-CM | POA: Diagnosis not present

## 2018-08-03 DIAGNOSIS — I220 Subsequent ST elevation (STEMI) myocardial infarction of anterior wall: Secondary | ICD-10-CM | POA: Diagnosis not present

## 2018-08-04 DIAGNOSIS — I220 Subsequent ST elevation (STEMI) myocardial infarction of anterior wall: Secondary | ICD-10-CM | POA: Diagnosis not present

## 2018-08-05 DIAGNOSIS — I220 Subsequent ST elevation (STEMI) myocardial infarction of anterior wall: Secondary | ICD-10-CM | POA: Diagnosis not present

## 2018-08-06 DIAGNOSIS — I220 Subsequent ST elevation (STEMI) myocardial infarction of anterior wall: Secondary | ICD-10-CM | POA: Diagnosis not present

## 2018-08-07 ENCOUNTER — Ambulatory Visit: Payer: Medicaid Other | Admitting: Oncology

## 2018-08-07 DIAGNOSIS — I220 Subsequent ST elevation (STEMI) myocardial infarction of anterior wall: Secondary | ICD-10-CM | POA: Diagnosis not present

## 2018-08-08 ENCOUNTER — Ambulatory Visit: Payer: Medicaid Other | Attending: Oncology | Admitting: Physical Therapy

## 2018-08-08 DIAGNOSIS — R293 Abnormal posture: Secondary | ICD-10-CM | POA: Diagnosis not present

## 2018-08-08 DIAGNOSIS — M6281 Muscle weakness (generalized): Secondary | ICD-10-CM

## 2018-08-08 DIAGNOSIS — I972 Postmastectomy lymphedema syndrome: Secondary | ICD-10-CM | POA: Insufficient documentation

## 2018-08-08 DIAGNOSIS — I220 Subsequent ST elevation (STEMI) myocardial infarction of anterior wall: Secondary | ICD-10-CM | POA: Diagnosis not present
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
## 2018-08-09 DIAGNOSIS — I220 Subsequent ST elevation (STEMI) myocardial infarction of anterior wall: Secondary | ICD-10-CM | POA: Diagnosis not present

## 2018-08-10 ENCOUNTER — Other Ambulatory Visit: Payer: Self-pay | Admitting: *Deleted

## 2018-08-10 DIAGNOSIS — I220 Subsequent ST elevation (STEMI) myocardial infarction of anterior wall: Secondary | ICD-10-CM | POA: Diagnosis not present

## 2018-08-10 DIAGNOSIS — G8929 Other chronic pain: Secondary | ICD-10-CM

## 2018-08-10 DIAGNOSIS — Z853 Personal history of malignant neoplasm of breast: Secondary | ICD-10-CM

## 2018-08-10 DIAGNOSIS — M792 Neuralgia and neuritis, unspecified: Secondary | ICD-10-CM

## 2018-08-10 DIAGNOSIS — I10 Essential (primary) hypertension: Secondary | ICD-10-CM

## 2018-08-10 MED ORDER — METHADONE HCL 5 MG PO TABS: 15.0000 mg | ORAL_TABLET | Freq: Three times a day (TID) | ORAL | 0 refills | Status: DC | PRN

## 2018-08-10 MED FILL — METHADONE HCL 5 MG TABLET: 5 | 30 days supply | Qty: 270 | Fill #0

## 2018-08-11 DIAGNOSIS — I220 Subsequent ST elevation (STEMI) myocardial infarction of anterior wall: Secondary | ICD-10-CM | POA: Diagnosis not present

## 2018-08-12 DIAGNOSIS — I220 Subsequent ST elevation (STEMI) myocardial infarction of anterior wall: Secondary | ICD-10-CM | POA: Diagnosis not present

## 2018-08-13 DIAGNOSIS — I220 Subsequent ST elevation (STEMI) myocardial infarction of anterior wall: Secondary | ICD-10-CM | POA: Diagnosis not present

## 2018-08-14 DIAGNOSIS — I220 Subsequent ST elevation (STEMI) myocardial infarction of anterior wall: Secondary | ICD-10-CM | POA: Diagnosis not present

## 2018-08-15 ENCOUNTER — Ambulatory Visit: Payer: Medicaid Other

## 2018-08-15 DIAGNOSIS — I220 Subsequent ST elevation (STEMI) myocardial infarction of anterior wall: Secondary | ICD-10-CM | POA: Diagnosis not present

## 2018-08-16 DIAGNOSIS — I220 Subsequent ST elevation (STEMI) myocardial infarction of anterior wall: Secondary | ICD-10-CM | POA: Diagnosis not present

## 2018-08-16 DIAGNOSIS — C50912 Malignant neoplasm of unspecified site of left female breast: Secondary | ICD-10-CM | POA: Diagnosis not present

## 2018-08-16 DIAGNOSIS — Z9011 Acquired absence of right breast and nipple: Secondary | ICD-10-CM | POA: Diagnosis not present

## 2018-08-17 DIAGNOSIS — I220 Subsequent ST elevation (STEMI) myocardial infarction of anterior wall: Secondary | ICD-10-CM | POA: Diagnosis not present

## 2018-08-18 DIAGNOSIS — I220 Subsequent ST elevation (STEMI) myocardial infarction of anterior wall: Secondary | ICD-10-CM | POA: Diagnosis not present

## 2018-08-19 DIAGNOSIS — I220 Subsequent ST elevation (STEMI) myocardial infarction of anterior wall: Secondary | ICD-10-CM | POA: Diagnosis not present

## 2018-08-20 DIAGNOSIS — I220 Subsequent ST elevation (STEMI) myocardial infarction of anterior wall: Secondary | ICD-10-CM | POA: Diagnosis not present

## 2018-08-21 DIAGNOSIS — I220 Subsequent ST elevation (STEMI) myocardial infarction of anterior wall: Secondary | ICD-10-CM | POA: Diagnosis not present

## 2018-08-22 ENCOUNTER — Ambulatory Visit: Payer: Medicaid Other | Attending: Oncology

## 2018-08-22 DIAGNOSIS — R293 Abnormal posture: Secondary | ICD-10-CM | POA: Diagnosis not present

## 2018-08-22 DIAGNOSIS — I220 Subsequent ST elevation (STEMI) myocardial infarction of anterior wall: Secondary | ICD-10-CM | POA: Diagnosis not present

## 2018-08-22 DIAGNOSIS — M6281 Muscle weakness (generalized): Secondary | ICD-10-CM | POA: Diagnosis not present

## 2018-08-22 DIAGNOSIS — I972 Postmastectomy lymphedema syndrome: Secondary | ICD-10-CM | POA: Insufficient documentation
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
## 2018-08-23 DIAGNOSIS — I220 Subsequent ST elevation (STEMI) myocardial infarction of anterior wall: Secondary | ICD-10-CM | POA: Diagnosis not present

## 2018-08-24 DIAGNOSIS — I220 Subsequent ST elevation (STEMI) myocardial infarction of anterior wall: Secondary | ICD-10-CM | POA: Diagnosis not present

## 2018-08-25 ENCOUNTER — Encounter: Payer: Self-pay | Admitting: Licensed Clinical Social Worker

## 2018-08-25 ENCOUNTER — Ambulatory Visit: Payer: Medicaid Other | Admitting: Family Medicine

## 2018-08-25 DIAGNOSIS — I220 Subsequent ST elevation (STEMI) myocardial infarction of anterior wall: Secondary | ICD-10-CM | POA: Diagnosis not present

## 2018-08-26 DIAGNOSIS — I220 Subsequent ST elevation (STEMI) myocardial infarction of anterior wall: Secondary | ICD-10-CM | POA: Diagnosis not present

## 2018-08-27 DIAGNOSIS — I220 Subsequent ST elevation (STEMI) myocardial infarction of anterior wall: Secondary | ICD-10-CM | POA: Diagnosis not present

## 2018-08-28 DIAGNOSIS — I220 Subsequent ST elevation (STEMI) myocardial infarction of anterior wall: Secondary | ICD-10-CM | POA: Diagnosis not present

## 2018-08-29 ENCOUNTER — Ambulatory Visit: Payer: Medicaid Other

## 2018-08-29 DIAGNOSIS — I220 Subsequent ST elevation (STEMI) myocardial infarction of anterior wall: Secondary | ICD-10-CM | POA: Diagnosis not present

## 2018-08-30 ENCOUNTER — Ambulatory Visit: Payer: Medicaid Other | Admitting: Family Medicine

## 2018-08-30 DIAGNOSIS — I220 Subsequent ST elevation (STEMI) myocardial infarction of anterior wall: Secondary | ICD-10-CM | POA: Diagnosis not present

## 2018-08-31 ENCOUNTER — Other Ambulatory Visit: Payer: Self-pay

## 2018-08-31 DIAGNOSIS — I220 Subsequent ST elevation (STEMI) myocardial infarction of anterior wall: Secondary | ICD-10-CM | POA: Diagnosis not present

## 2018-08-31 MED ORDER — TRIAMTERENE-HCTZ 75-50 MG PO TABS: 1.0000 | ORAL_TABLET | Freq: Every day | ORAL | 3 refills | Status: DC

## 2018-09-01 DIAGNOSIS — I220 Subsequent ST elevation (STEMI) myocardial infarction of anterior wall: Secondary | ICD-10-CM | POA: Diagnosis not present

## 2018-09-02 DIAGNOSIS — I220 Subsequent ST elevation (STEMI) myocardial infarction of anterior wall: Secondary | ICD-10-CM | POA: Diagnosis not present

## 2018-09-03 DIAGNOSIS — I220 Subsequent ST elevation (STEMI) myocardial infarction of anterior wall: Secondary | ICD-10-CM | POA: Diagnosis not present

## 2018-09-04 ENCOUNTER — Ambulatory Visit: Payer: Medicaid Other | Admitting: Family Medicine

## 2018-09-04 ENCOUNTER — Encounter: Payer: Self-pay | Admitting: Obstetrics & Gynecology

## 2018-09-04 ENCOUNTER — Ambulatory Visit (INDEPENDENT_AMBULATORY_CARE_PROVIDER_SITE_OTHER): Payer: Medicaid Other | Admitting: Obstetrics & Gynecology

## 2018-09-04 VITALS — BP 112/62 | HR 70 | Wt 221.6 lb

## 2018-09-04 DIAGNOSIS — I220 Subsequent ST elevation (STEMI) myocardial infarction of anterior wall: Secondary | ICD-10-CM | POA: Diagnosis not present

## 2018-09-04 DIAGNOSIS — Z716 Tobacco abuse counseling: Secondary | ICD-10-CM

## 2018-09-04 DIAGNOSIS — N8501 Benign endometrial hyperplasia: Secondary | ICD-10-CM

## 2018-09-04 DIAGNOSIS — D219 Benign neoplasm of connective and other soft tissue, unspecified: Secondary | ICD-10-CM | POA: Diagnosis not present

## 2018-09-04 NOTE — Progress Notes (Signed)
History:  57 y.o. G0P0000 here today for follow up. Pt was prev scheduled for a RATH with BSO. She has adjusted the schedule several times and cancelled twice. Pt was dx'd with simple hyperplasia and uterine fibroids. She has a h/o breast cancer and is on Tamoxifen. She had a bx due to abd pain. She reports no bleeding since 2012 and the pain has completely resolved. She is scared ot ahv ethe surgery. She reports new onset 'knots in her leg.'  She is scheduled to see a 'specialist' for this issue on this coming WED.     The following portions of the patient's history were reviewed and updated as appropriate: allergies, current medications, past family history, past medical history, past social history, past surgical history and problem list.  Review of Systems:  Pertinent items are noted in HPI.    Objective:  Physical Exam Blood pressure 112/62, pulse 70, weight 221 lb 9.6 oz (100.5 kg), last menstrual period 08/07/2011.  CONSTITUTIONAL: Well-developed, well-nourished female in no acute distress.  HENT:  Normocephalic, atraumatic EYES: Conjunctivae and EOM are normal. No scleral icterus.  NECK: Normal range of motion SKIN: Skin is warm and dry. No rash noted. Not diaphoretic.No pallor. [place]: Alert and oriented to person, place, and time. Normal coordination.    Labs and Imaging 10/25/2017 Diagnosis Endometrium, biopsy - SMALL FRAGMENT OF ENDOMETRIUM SHOWING FOCAL GLANDULAR CROWDING, SUSPICIOUS FOR SIMPLE HYPERPLASIA. - FRAGMENTS OF BENIGN ENDOMETRIAL GLANDULAR TISSUE WITH SQUAMOUS METAPLASIA AND TUBAL METAPLASIA. - FRAGMENTS OF BENIGN ENDOCERVICAL-TYPE GLANDULAR EPITHELIUM AND SQUAMOUS EPITHELIUM. - NO MALIGNANCY IDENTIFIED.  Assessment & Plan:  Simple hyperplasia on Tamoxifen; no active bleeding since 2012    rec repeat bx given the dx  Pt declined bx today but, agrees to f/u for bx in 2-4 weeks  Fibroids  Pt is asymptomatic   Given that she is PMP without sx if is  reasonable to allow fibrodis to gradually shrink on their  Own  Breast cancer  Dr. Rip Harbour had previously discussed BSO with pts GYN ONC however, this is elective and pt  is still deciding whether to proceed with surgery.   tob use  rec complete TOb cessation  Pt commended for decreasing the amount of tob use.   Total face-to-face time with patient was 52min.  Greater than 50% was spent in counseling and coordination of care with the patient.   Jamon Hayhurst L. Harraway-Smith, M.D., [place]lynn June

## 2018-09-05 DIAGNOSIS — I220 Subsequent ST elevation (STEMI) myocardial infarction of anterior wall: Secondary | ICD-10-CM | POA: Diagnosis not present

## 2018-09-06 ENCOUNTER — Ambulatory Visit: Payer: Medicaid Other | Admitting: Family Medicine

## 2018-09-06 VITALS — BP 130/80 | HR 80 | Temp 98.0°F | Wt 220.0 lb

## 2018-09-06 DIAGNOSIS — I220 Subsequent ST elevation (STEMI) myocardial infarction of anterior wall: Secondary | ICD-10-CM | POA: Diagnosis not present

## 2018-09-06 DIAGNOSIS — M79604 Pain in right leg: Secondary | ICD-10-CM

## 2018-09-06 DIAGNOSIS — M79661 Pain in right lower leg: Secondary | ICD-10-CM | POA: Diagnosis not present

## 2018-09-06 NOTE — Progress Notes (Signed)
   Subjective:   Patient ID: [name]    DOB: [birth year], 57 y.o. female   MRN: [id number]  CC: right leg pain  HPI: [name] is a 57 y.o. female who presents to clinic today for the following issue.  Right leg pain   Pain started 1 month ago.  She has known arthritis in her knees and states she sometimes feels like there are knots in her legs.  Mild calf pain and swelling. She is concerned she may have a clot. No fevers, chills, nausea, vomiting. No prior history of DVT or PE.  She is a current daily smoker, 7-10 cigarettes a day, has been smoking ~20+ years. Was told by last PCP that she has varicose veins.  No CP, SOB at this time.  ROS: No fever, chills, nausea, vomiting.  No CP, SOB. +Calf pain.    Social: Patient is a current daily smoker. Medications reviewed. Objective:   BP 130/80 (BP Location: Right Arm, Patient Position: Sitting, Cuff Size: Large)   Pulse 80   Temp 98 F (36.7 C) (Oral)   Wt 220 lb (99.8 kg)   LMP 08/07/2011   SpO2 96%   BMI 36.61 kg/m  Vitals and nursing note reviewed.  General: 57 year old female, NAD CV: RRR no MRG  Lungs: CTAB, normal effort on room air Skin: warm, dry, no rash  Right leg: Warm and well perfused, mild swelling and tenderness noted at calf, no erythema or edema, normal tone  Neuro: alert, oriented x3, no focal deficits   Assessment & Plan:   Right leg pain Negative Homans sign however  concern for DVT given + calf tenderness and swelling on exam and longstanding history of tobacco use.  Advised patient to go to the ER however she declined given long wait times.  I have scheduled her for US doppler RLE scheduled for Monday 1PM at [place].  Red flags reviewed.  Orders Placed This Encounter  Procedures  . US Venous Img Lower Unilateral Right    Epic order - sent for co-sign R/O DVT Wt 220 / cane Ins - mcd Bl/shannon @ [facility]    Standing Status:   Future    Standing Expiration Date:   12/07/2019   Order Specific Question:   Reason for Exam (SYMPTOM  OR DIAGNOSIS REQUIRED)    Answer:   right calf pain    Order Specific Question:   Preferred imaging location?    Answer:   [facility]   Lovenia Kim, MD [place] PGY-3

## 2018-09-06 NOTE — Patient Instructions (Signed)
It was nice meeting you today.  You are seen in clinic for right calf pain concerning for a possible blood clot.  I have scheduling you for a Doppler ultrasound for evaluation of this leg.  We will call you once we have the results of this.  If you have any new or worsening symptoms, or any shortness of breath, chest pain or leg swelling, I would like you to be seen in the ER.  Please call clinic if you have any questions.  Lovenia Kim MD

## 2018-09-07 ENCOUNTER — Other Ambulatory Visit: Payer: Self-pay | Admitting: Family Medicine

## 2018-09-07 ENCOUNTER — Ambulatory Visit: Payer: Medicaid Other

## 2018-09-07 ENCOUNTER — Other Ambulatory Visit: Payer: Self-pay | Admitting: *Deleted

## 2018-09-07 DIAGNOSIS — I220 Subsequent ST elevation (STEMI) myocardial infarction of anterior wall: Secondary | ICD-10-CM | POA: Diagnosis not present

## 2018-09-07 DIAGNOSIS — Z853 Personal history of malignant neoplasm of breast: Secondary | ICD-10-CM

## 2018-09-07 DIAGNOSIS — I10 Essential (primary) hypertension: Secondary | ICD-10-CM

## 2018-09-07 DIAGNOSIS — M792 Neuralgia and neuritis, unspecified: Secondary | ICD-10-CM

## 2018-09-07 DIAGNOSIS — G8929 Other chronic pain: Secondary | ICD-10-CM

## 2018-09-07 MED ORDER — METHADONE HCL 5 MG PO TABS: 15.0000 mg | ORAL_TABLET | Freq: Three times a day (TID) | ORAL | 0 refills | Status: DC | PRN

## 2018-09-07 MED FILL — METHADONE HCL 5 MG TABLET: 5 | 30 days supply | Qty: 270 | Fill #0

## 2018-09-08 DIAGNOSIS — I220 Subsequent ST elevation (STEMI) myocardial infarction of anterior wall: Secondary | ICD-10-CM | POA: Diagnosis not present

## 2018-09-09 ENCOUNTER — Other Ambulatory Visit: Payer: Self-pay | Admitting: Family Medicine

## 2018-09-09 DIAGNOSIS — I220 Subsequent ST elevation (STEMI) myocardial infarction of anterior wall: Secondary | ICD-10-CM | POA: Diagnosis not present

## 2018-09-10 DIAGNOSIS — I220 Subsequent ST elevation (STEMI) myocardial infarction of anterior wall: Secondary | ICD-10-CM | POA: Diagnosis not present

## 2018-09-11 ENCOUNTER — Other Ambulatory Visit: Payer: Medicaid Other

## 2018-09-11 DIAGNOSIS — I220 Subsequent ST elevation (STEMI) myocardial infarction of anterior wall: Secondary | ICD-10-CM | POA: Diagnosis not present

## 2018-09-12 DIAGNOSIS — I220 Subsequent ST elevation (STEMI) myocardial infarction of anterior wall: Secondary | ICD-10-CM | POA: Diagnosis not present

## 2018-09-13 ENCOUNTER — Ambulatory Visit: Payer: Medicaid Other

## 2018-09-13 ENCOUNTER — Other Ambulatory Visit: Payer: Self-pay | Admitting: Family Medicine

## 2018-09-13 DIAGNOSIS — I220 Subsequent ST elevation (STEMI) myocardial infarction of anterior wall: Secondary | ICD-10-CM | POA: Diagnosis not present

## 2018-09-13 DIAGNOSIS — M79604 Pain in right leg: Secondary | ICD-10-CM | POA: Insufficient documentation

## 2018-09-13 NOTE — Assessment & Plan Note (Addendum)
Negative Homans sign however  concern for DVT given + calf tenderness and swelling on exam and longstanding history of tobacco use.  Advised patient to go to the ER however she declined given long wait times.  I have scheduled her for US doppler RLE scheduled for Monday 1PM at [place].  Red flags reviewed.

## 2018-09-14 ENCOUNTER — Telehealth: Payer: Self-pay | Admitting: Family Medicine

## 2018-09-14 ENCOUNTER — Other Ambulatory Visit: Payer: Self-pay | Admitting: Family Medicine

## 2018-09-14 DIAGNOSIS — I220 Subsequent ST elevation (STEMI) myocardial infarction of anterior wall: Secondary | ICD-10-CM | POA: Diagnosis not present

## 2018-09-14 DIAGNOSIS — E876 Hypokalemia: Secondary | ICD-10-CM

## 2018-09-14 NOTE — Telephone Encounter (Signed)
Entered in error

## 2018-09-15 ENCOUNTER — Telehealth: Payer: Self-pay

## 2018-09-15 ENCOUNTER — Other Ambulatory Visit: Payer: Self-pay | Admitting: Family Medicine

## 2018-09-15 DIAGNOSIS — I220 Subsequent ST elevation (STEMI) myocardial infarction of anterior wall: Secondary | ICD-10-CM | POA: Diagnosis not present

## 2018-09-15 NOTE — Telephone Encounter (Signed)
I called patient and scheduled her for a lab appt Monday at 9:30.  Patient is aware of this appointment.

## 2018-09-15 NOTE — Telephone Encounter (Signed)
Patient calling about potassium refill. I can see refill was refused for reason of needs appt. Lab orders were entered. Does patient need OV or lab visit only.  Please call her with what she needs to do for refill.  Call back is [phone]  Danley Danker, RN [facility])

## 2018-09-16 DIAGNOSIS — I220 Subsequent ST elevation (STEMI) myocardial infarction of anterior wall: Secondary | ICD-10-CM | POA: Diagnosis not present

## 2018-09-17 ENCOUNTER — Other Ambulatory Visit: Payer: Self-pay | Admitting: Family Medicine

## 2018-09-17 DIAGNOSIS — I220 Subsequent ST elevation (STEMI) myocardial infarction of anterior wall: Secondary | ICD-10-CM | POA: Diagnosis not present

## 2018-09-17 DIAGNOSIS — Z853 Personal history of malignant neoplasm of breast: Secondary | ICD-10-CM

## 2018-09-17 DIAGNOSIS — G8929 Other chronic pain: Secondary | ICD-10-CM

## 2018-09-17 DIAGNOSIS — I10 Essential (primary) hypertension: Secondary | ICD-10-CM

## 2018-09-17 DIAGNOSIS — M792 Neuralgia and neuritis, unspecified: Secondary | ICD-10-CM

## 2018-09-18 ENCOUNTER — Other Ambulatory Visit: Payer: Medicaid Other

## 2018-09-18 ENCOUNTER — Telehealth: Payer: Self-pay | Admitting: *Deleted

## 2018-09-18 DIAGNOSIS — I220 Subsequent ST elevation (STEMI) myocardial infarction of anterior wall: Secondary | ICD-10-CM | POA: Diagnosis not present

## 2018-09-18 NOTE — Telephone Encounter (Signed)
Pt has appointment scheduled for this on 09/22/2018. Looks like it was rescheduled for then. Katharina Caper, April D, [place]

## 2018-09-18 NOTE — Telephone Encounter (Signed)
-----   Message from [name], MD sent at 09/14/2018  6:03 PM EDT ----- Regarding: BMP for patient before refilling K Please let the patient know that since she is taking a potassium-sparing diuretic (triamterene) and taking K supplementation, I would like for her to get a BMP before refilling her potassium to make sure it's at a safe level.  I placed the lab order today as a future order.  Thank you!

## 2018-09-19 DIAGNOSIS — I220 Subsequent ST elevation (STEMI) myocardial infarction of anterior wall: Secondary | ICD-10-CM | POA: Diagnosis not present

## 2018-09-20 DIAGNOSIS — I220 Subsequent ST elevation (STEMI) myocardial infarction of anterior wall: Secondary | ICD-10-CM | POA: Diagnosis not present

## 2018-09-21 DIAGNOSIS — I220 Subsequent ST elevation (STEMI) myocardial infarction of anterior wall: Secondary | ICD-10-CM | POA: Diagnosis not present

## 2018-09-22 ENCOUNTER — Other Ambulatory Visit: Payer: Medicaid Other

## 2018-09-22 DIAGNOSIS — I220 Subsequent ST elevation (STEMI) myocardial infarction of anterior wall: Secondary | ICD-10-CM | POA: Diagnosis not present

## 2018-09-23 DIAGNOSIS — I220 Subsequent ST elevation (STEMI) myocardial infarction of anterior wall: Secondary | ICD-10-CM | POA: Diagnosis not present

## 2018-09-24 DIAGNOSIS — I220 Subsequent ST elevation (STEMI) myocardial infarction of anterior wall: Secondary | ICD-10-CM | POA: Diagnosis not present

## 2018-09-25 DIAGNOSIS — I220 Subsequent ST elevation (STEMI) myocardial infarction of anterior wall: Secondary | ICD-10-CM | POA: Diagnosis not present

## 2018-09-26 DIAGNOSIS — I220 Subsequent ST elevation (STEMI) myocardial infarction of anterior wall: Secondary | ICD-10-CM | POA: Diagnosis not present

## 2018-09-27 DIAGNOSIS — I220 Subsequent ST elevation (STEMI) myocardial infarction of anterior wall: Secondary | ICD-10-CM | POA: Diagnosis not present

## 2018-09-28 DIAGNOSIS — I220 Subsequent ST elevation (STEMI) myocardial infarction of anterior wall: Secondary | ICD-10-CM | POA: Diagnosis not present

## 2018-09-29 ENCOUNTER — Other Ambulatory Visit: Payer: Medicaid Other

## 2018-09-29 DIAGNOSIS — I220 Subsequent ST elevation (STEMI) myocardial infarction of anterior wall: Secondary | ICD-10-CM | POA: Diagnosis not present

## 2018-09-30 DIAGNOSIS — I220 Subsequent ST elevation (STEMI) myocardial infarction of anterior wall: Secondary | ICD-10-CM | POA: Diagnosis not present

## 2018-10-01 DIAGNOSIS — I220 Subsequent ST elevation (STEMI) myocardial infarction of anterior wall: Secondary | ICD-10-CM | POA: Diagnosis not present

## 2018-10-02 DIAGNOSIS — I220 Subsequent ST elevation (STEMI) myocardial infarction of anterior wall: Secondary | ICD-10-CM | POA: Diagnosis not present

## 2018-10-03 DIAGNOSIS — I220 Subsequent ST elevation (STEMI) myocardial infarction of anterior wall: Secondary | ICD-10-CM | POA: Diagnosis not present

## 2018-10-04 ENCOUNTER — Ambulatory Visit: Payer: Medicaid Other | Admitting: Physical Therapy

## 2018-10-04 DIAGNOSIS — I220 Subsequent ST elevation (STEMI) myocardial infarction of anterior wall: Secondary | ICD-10-CM | POA: Diagnosis not present

## 2018-10-05 ENCOUNTER — Ambulatory Visit: Payer: Medicaid Other | Attending: Oncology

## 2018-10-05 ENCOUNTER — Other Ambulatory Visit: Payer: Self-pay | Admitting: *Deleted

## 2018-10-05 DIAGNOSIS — Z853 Personal history of malignant neoplasm of breast: Secondary | ICD-10-CM

## 2018-10-05 DIAGNOSIS — I220 Subsequent ST elevation (STEMI) myocardial infarction of anterior wall: Secondary | ICD-10-CM | POA: Diagnosis not present

## 2018-10-05 DIAGNOSIS — I972 Postmastectomy lymphedema syndrome: Secondary | ICD-10-CM | POA: Insufficient documentation

## 2018-10-05 DIAGNOSIS — M6281 Muscle weakness (generalized): Secondary | ICD-10-CM | POA: Insufficient documentation

## 2018-10-05 DIAGNOSIS — M792 Neuralgia and neuritis, unspecified: Secondary | ICD-10-CM

## 2018-10-05 DIAGNOSIS — I10 Essential (primary) hypertension: Secondary | ICD-10-CM

## 2018-10-05 DIAGNOSIS — G8929 Other chronic pain: Secondary | ICD-10-CM

## 2018-10-05 DIAGNOSIS — R293 Abnormal posture: Secondary | ICD-10-CM | POA: Insufficient documentation

## 2018-10-05 MED ORDER — METHADONE HCL 5 MG PO TABS: 15.0000 mg | ORAL_TABLET | Freq: Three times a day (TID) | ORAL | 0 refills | Status: DC | PRN

## 2018-10-05 MED FILL — METHADONE HCL 5 MG TABLET: 5 | 30 days supply | Qty: 270 | Fill #0

## 2018-10-06 ENCOUNTER — Ambulatory Visit: Payer: Medicaid Other | Admitting: Obstetrics & Gynecology

## 2018-10-06 ENCOUNTER — Other Ambulatory Visit: Payer: Medicaid Other

## 2018-10-06 DIAGNOSIS — I220 Subsequent ST elevation (STEMI) myocardial infarction of anterior wall: Secondary | ICD-10-CM | POA: Diagnosis not present

## 2018-10-07 DIAGNOSIS — I220 Subsequent ST elevation (STEMI) myocardial infarction of anterior wall: Secondary | ICD-10-CM | POA: Diagnosis not present

## 2018-10-08 DIAGNOSIS — I220 Subsequent ST elevation (STEMI) myocardial infarction of anterior wall: Secondary | ICD-10-CM | POA: Diagnosis not present

## 2018-10-09 DIAGNOSIS — I220 Subsequent ST elevation (STEMI) myocardial infarction of anterior wall: Secondary | ICD-10-CM | POA: Diagnosis not present

## 2018-10-10 DIAGNOSIS — I220 Subsequent ST elevation (STEMI) myocardial infarction of anterior wall: Secondary | ICD-10-CM | POA: Diagnosis not present

## 2018-10-11 ENCOUNTER — Other Ambulatory Visit: Payer: Self-pay | Admitting: Family Medicine

## 2018-10-11 ENCOUNTER — Ambulatory Visit: Payer: Medicaid Other

## 2018-10-11 DIAGNOSIS — R293 Abnormal posture: Secondary | ICD-10-CM

## 2018-10-11 DIAGNOSIS — I972 Postmastectomy lymphedema syndrome: Secondary | ICD-10-CM | POA: Diagnosis not present

## 2018-10-11 DIAGNOSIS — M6281 Muscle weakness (generalized): Secondary | ICD-10-CM

## 2018-10-11 DIAGNOSIS — I220 Subsequent ST elevation (STEMI) myocardial infarction of anterior wall: Secondary | ICD-10-CM | POA: Diagnosis not present
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
## 2018-10-11 NOTE — Patient Instructions (Signed)
3 Way Raises:      Starting Position:  Leaning against wall, walk feet a few inches away from the wall and make tummy tight (tuck hips underneath you) Press back/shoulders/head against wall as much as possible. Keep thumbs up to ceiling, elbows straight and shoulders relaxed/down throughout.  1. Lift arms in front to shoulder height 2. Lift arms a little wider into a "V" to shoulder height 3. Lift arms out to sides in a "T" to shoulder height  Perform 10 times in each direction. Hold 1-2 lbs to start with and work up to 2-3 sets of 10/day. Perform 3-4 times/week. Increase weight as able, decreasing sets of 10 each time you increase weights, then slowly working your way back up to 2-3 sets each time.    Cancer Rehab [phone]    

## 2018-10-12 ENCOUNTER — Other Ambulatory Visit: Payer: Medicaid Other

## 2018-10-12 DIAGNOSIS — I220 Subsequent ST elevation (STEMI) myocardial infarction of anterior wall: Secondary | ICD-10-CM | POA: Diagnosis not present

## 2018-10-13 DIAGNOSIS — I220 Subsequent ST elevation (STEMI) myocardial infarction of anterior wall: Secondary | ICD-10-CM | POA: Diagnosis not present

## 2018-10-14 DIAGNOSIS — I220 Subsequent ST elevation (STEMI) myocardial infarction of anterior wall: Secondary | ICD-10-CM | POA: Diagnosis not present

## 2018-10-15 DIAGNOSIS — I220 Subsequent ST elevation (STEMI) myocardial infarction of anterior wall: Secondary | ICD-10-CM | POA: Diagnosis not present

## 2018-10-16 ENCOUNTER — Ambulatory Visit: Payer: Medicaid Other | Admitting: Family Medicine

## 2018-10-16 DIAGNOSIS — I220 Subsequent ST elevation (STEMI) myocardial infarction of anterior wall: Secondary | ICD-10-CM | POA: Diagnosis not present

## 2018-10-17 ENCOUNTER — Other Ambulatory Visit: Payer: Medicaid Other

## 2018-10-17 DIAGNOSIS — I220 Subsequent ST elevation (STEMI) myocardial infarction of anterior wall: Secondary | ICD-10-CM | POA: Diagnosis not present

## 2018-10-18 DIAGNOSIS — I220 Subsequent ST elevation (STEMI) myocardial infarction of anterior wall: Secondary | ICD-10-CM | POA: Diagnosis not present

## 2018-10-19 ENCOUNTER — Ambulatory Visit: Payer: Medicaid Other | Admitting: Physical Therapy

## 2018-10-19 ENCOUNTER — Other Ambulatory Visit: Payer: Medicaid Other

## 2018-10-19 DIAGNOSIS — E876 Hypokalemia: Secondary | ICD-10-CM | POA: Diagnosis not present

## 2018-10-19 DIAGNOSIS — I220 Subsequent ST elevation (STEMI) myocardial infarction of anterior wall: Secondary | ICD-10-CM | POA: Diagnosis not present

## 2018-10-20 ENCOUNTER — Ambulatory Visit: Payer: Medicaid Other | Attending: Oncology | Admitting: Physical Therapy

## 2018-10-20 ENCOUNTER — Other Ambulatory Visit: Payer: Medicaid Other

## 2018-10-20 DIAGNOSIS — I220 Subsequent ST elevation (STEMI) myocardial infarction of anterior wall: Secondary | ICD-10-CM | POA: Diagnosis not present

## 2018-10-20 LAB — BASIC METABOLIC PANEL
BUN/Creatinine Ratio: 14 (ref 9–23)
BUN: 13 mg/dL (ref 6–24)
CHLORIDE: 100 mmol/L (ref 96–106)
CO2: 25 mmol/L (ref 20–29)
Calcium: 9.5 mg/dL (ref 8.7–10.2)
Creatinine, Ser: 0.91 mg/dL (ref 0.57–1.00)
GFR, EST AFRICAN AMERICAN: 82 mL/min/{1.73_m2} (ref >59)
GFR, EST NON AFRICAN AMERICAN: 71 mL/min/{1.73_m2} (ref >59)
Glucose: 101 mg/dL — ABNORMAL HIGH (ref 65–99)
Potassium: 3.4 mmol/L — ABNORMAL LOW (ref 3.5–5.2)
SODIUM: 140 mmol/L (ref 134–144)

## 2018-10-21 DIAGNOSIS — I220 Subsequent ST elevation (STEMI) myocardial infarction of anterior wall: Secondary | ICD-10-CM | POA: Diagnosis not present

## 2018-10-22 DIAGNOSIS — I220 Subsequent ST elevation (STEMI) myocardial infarction of anterior wall: Secondary | ICD-10-CM | POA: Diagnosis not present

## 2018-10-23 DIAGNOSIS — I220 Subsequent ST elevation (STEMI) myocardial infarction of anterior wall: Secondary | ICD-10-CM | POA: Diagnosis not present

## 2018-10-24 ENCOUNTER — Other Ambulatory Visit: Payer: Self-pay | Admitting: Family Medicine

## 2018-10-24 DIAGNOSIS — I220 Subsequent ST elevation (STEMI) myocardial infarction of anterior wall: Secondary | ICD-10-CM | POA: Diagnosis not present

## 2018-10-25 ENCOUNTER — Inpatient Hospital Stay (HOSPITAL_BASED_OUTPATIENT_CLINIC_OR_DEPARTMENT_OTHER): Payer: Medicaid Other | Admitting: Adult Health

## 2018-10-25 ENCOUNTER — Telehealth: Payer: Self-pay | Admitting: Adult Health

## 2018-10-25 ENCOUNTER — Inpatient Hospital Stay: Payer: Medicaid Other | Attending: Adult Health

## 2018-10-25 ENCOUNTER — Encounter: Payer: Self-pay | Admitting: Adult Health

## 2018-10-25 VITALS — BP 124/67 | HR 63 | Temp 97.9°F | Resp 18 | Ht 65.0 in | Wt 221.5 lb

## 2018-10-25 DIAGNOSIS — Z923 Personal history of irradiation: Secondary | ICD-10-CM

## 2018-10-25 DIAGNOSIS — Z79810 Long term (current) use of selective estrogen receptor modulators (SERMs): Secondary | ICD-10-CM | POA: Insufficient documentation

## 2018-10-25 DIAGNOSIS — Z7982 Long term (current) use of aspirin: Secondary | ICD-10-CM | POA: Insufficient documentation

## 2018-10-25 DIAGNOSIS — C50911 Malignant neoplasm of unspecified site of right female breast: Secondary | ICD-10-CM | POA: Insufficient documentation

## 2018-10-25 DIAGNOSIS — R7989 Other specified abnormal findings of blood chemistry: Secondary | ICD-10-CM | POA: Insufficient documentation

## 2018-10-25 DIAGNOSIS — G893 Neoplasm related pain (acute) (chronic): Secondary | ICD-10-CM

## 2018-10-25 DIAGNOSIS — C50412 Malignant neoplasm of upper-outer quadrant of left female breast: Secondary | ICD-10-CM

## 2018-10-25 DIAGNOSIS — Z9221 Personal history of antineoplastic chemotherapy: Secondary | ICD-10-CM | POA: Insufficient documentation

## 2018-10-25 DIAGNOSIS — I220 Subsequent ST elevation (STEMI) myocardial infarction of anterior wall: Secondary | ICD-10-CM | POA: Diagnosis not present

## 2018-10-25 DIAGNOSIS — Z170 Estrogen receptor positive status [ER+]: Secondary | ICD-10-CM

## 2018-10-25 DIAGNOSIS — F17210 Nicotine dependence, cigarettes, uncomplicated: Secondary | ICD-10-CM | POA: Insufficient documentation

## 2018-10-25 DIAGNOSIS — Z9013 Acquired absence of bilateral breasts and nipples: Secondary | ICD-10-CM

## 2018-10-25 DIAGNOSIS — C50811 Malignant neoplasm of overlapping sites of right female breast: Secondary | ICD-10-CM

## 2018-10-25 DIAGNOSIS — Z171 Estrogen receptor negative status [ER-]: Principal | ICD-10-CM

## 2018-10-25 LAB — COMPREHENSIVE METABOLIC PANEL
ALK PHOS: 80 U/L (ref 38–126)
ALT: 18 U/L (ref 0–44)
AST: 23 U/L (ref 15–41)
Albumin: 3.5 g/dL (ref 3.5–5.0)
Anion gap: 10 (ref 5–15)
BUN: 13 mg/dL (ref 6–20)
CALCIUM: 9.3 mg/dL (ref 8.9–10.3)
CHLORIDE: 103 mmol/L (ref 98–111)
CO2: 29 mmol/L (ref 22–32)
CREATININE: 1.03 mg/dL — AB (ref 0.44–1.00)
GFR calc Af Amer: >60 mL/min (ref >60)
GFR calc non Af Amer: >60 mL/min (ref >60)
Glucose, Bld: 121 mg/dL — ABNORMAL HIGH (ref 70–99)
Potassium: 2.9 mmol/L — CL (ref 3.5–5.1)
Sodium: 142 mmol/L (ref 135–145)
Total Bilirubin: 0.5 mg/dL (ref 0.3–1.2)
Total Protein: 6.7 g/dL (ref 6.5–8.1)

## 2018-10-25 LAB — CBC WITH DIFFERENTIAL/PLATELET
Abs Immature Granulocytes: 0.04 10*3/uL (ref 0.00–0.07)
BASOS ABS: 0 10*3/uL (ref 0.0–0.1)
Basophils Relative: 1 %
EOS ABS: 0.1 10*3/uL (ref 0.0–0.5)
EOS PCT: 2 %
HEMATOCRIT: 40.7 % (ref 36.0–46.0)
Hemoglobin: 13.6 g/dL (ref 12.0–15.0)
Immature Granulocytes: 1 %
LYMPHS ABS: 1.8 10*3/uL (ref 0.7–4.0)
LYMPHS PCT: 21 %
MCH: 28.9 pg (ref 26.0–34.0)
MCHC: 33.4 g/dL (ref 30.0–36.0)
MCV: 86.4 fL (ref 80.0–100.0)
MONO ABS: 0.5 10*3/uL (ref 0.1–1.0)
Monocytes Relative: 6 %
Neutro Abs: 5.9 10*3/uL (ref 1.7–7.7)
Neutrophils Relative %: 69 %
Platelets: 435 10*3/uL — ABNORMAL HIGH (ref 150–400)
RBC: 4.71 MIL/uL (ref 3.87–5.11)
RDW: 15.3 % (ref 11.5–15.5)
WBC: 8.5 10*3/uL (ref 4.0–10.5)
nRBC: 0 % (ref 0.0–0.2)
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
## 2018-10-25 NOTE — Telephone Encounter (Signed)
Gave patient avs and calendar.   °

## 2018-10-26 ENCOUNTER — Other Ambulatory Visit: Payer: Self-pay | Admitting: Adult Health

## 2018-10-26 ENCOUNTER — Ambulatory Visit: Payer: Medicaid Other | Admitting: Physical Therapy

## 2018-10-26 ENCOUNTER — Encounter: Payer: Self-pay | Admitting: Adult Health

## 2018-10-26 ENCOUNTER — Telehealth: Payer: Self-pay

## 2018-10-26 DIAGNOSIS — E876 Hypokalemia: Secondary | ICD-10-CM

## 2018-10-26 DIAGNOSIS — I220 Subsequent ST elevation (STEMI) myocardial infarction of anterior wall: Secondary | ICD-10-CM | POA: Diagnosis not present

## 2018-10-26 MED ORDER — POTASSIUM CHLORIDE CRYS ER 20 MEQ PO TBCR: 20.0000 meq | EXTENDED_RELEASE_TABLET | Freq: Two times a day (BID) | ORAL | 0 refills | Status: DC

## 2018-10-26 NOTE — Telephone Encounter (Signed)
Spoke with patient about low potassium results (2.9).  Made patient aware that NP sent Klor Con 20 meq po bid x 1 week (pt recently on this medication) to her pharmacy and that she is to f/u with her PCP in 1 week to have potassium checked again.  Patient voiced understanding and had no questions or concerns at this time.

## 2018-10-27 ENCOUNTER — Other Ambulatory Visit: Payer: Medicaid Other

## 2018-10-27 ENCOUNTER — Ambulatory Visit: Payer: Medicaid Other | Admitting: Rehabilitation

## 2018-10-27 DIAGNOSIS — I220 Subsequent ST elevation (STEMI) myocardial infarction of anterior wall: Secondary | ICD-10-CM | POA: Diagnosis not present

## 2018-10-28 DIAGNOSIS — I220 Subsequent ST elevation (STEMI) myocardial infarction of anterior wall: Secondary | ICD-10-CM | POA: Diagnosis not present

## 2018-10-29 DIAGNOSIS — I220 Subsequent ST elevation (STEMI) myocardial infarction of anterior wall: Secondary | ICD-10-CM | POA: Diagnosis not present

## 2018-10-30 DIAGNOSIS — I220 Subsequent ST elevation (STEMI) myocardial infarction of anterior wall: Secondary | ICD-10-CM | POA: Diagnosis not present

## 2018-10-31 ENCOUNTER — Encounter: Payer: Self-pay | Admitting: Obstetrics & Gynecology

## 2018-10-31 ENCOUNTER — Ambulatory Visit (INDEPENDENT_AMBULATORY_CARE_PROVIDER_SITE_OTHER): Payer: Medicaid Other | Admitting: Obstetrics & Gynecology

## 2018-10-31 ENCOUNTER — Other Ambulatory Visit (HOSPITAL_COMMUNITY)
Admission: RE | Admit: 2018-10-31 | Discharge: 2018-10-31 | Disposition: A | Discharge status: Home / Self Care | Payer: Medicaid Other | Source: Ambulatory Visit | Attending: Obstetrics & Gynecology | Admitting: Obstetrics & Gynecology

## 2018-10-31 VITALS — BP 109/67 | HR 71 | Wt 222.1 lb

## 2018-10-31 DIAGNOSIS — N8501 Benign endometrial hyperplasia: Secondary | ICD-10-CM | POA: Diagnosis not present

## 2018-10-31 DIAGNOSIS — I220 Subsequent ST elevation (STEMI) myocardial infarction of anterior wall: Secondary | ICD-10-CM | POA: Diagnosis not present

## 2018-10-31 DIAGNOSIS — N858 Other specified noninflammatory disorders of uterus: Secondary | ICD-10-CM | POA: Diagnosis not present

## 2018-10-31 NOTE — Patient Instructions (Signed)

## 2018-10-31 NOTE — Addendum Note (Signed)
Addended by: Fidela Juneau A on: 10/31/2018 12:06 PM   Modules accepted: Orders

## 2018-10-31 NOTE — Progress Notes (Signed)
Pt denies bleeding since 2012. She reports that she is not having any pain. She wants to avoid surgery. She is here to have the endo bx repeated to follow the prev simple hyperplasia. She remains on tamoxifen.  The indications for endometrial biopsy were reviewed.   Risks of the biopsy including cramping, bleeding, infection, uterine perforation, inadequate specimen and need for additional procedures  were discussed. The patient states she understands and agrees to undergo procedure today. Consent was signed. Time out was performed. Urine HCG was negative. A sterile speculum was placed in the patient's vagina and the cervix was prepped with Betadine. A single-toothed tenaculum was placed on the anterior lip of the cervix to stabilize it. The 3 mm pipelle was introduced into the endometrial cavity without difficulty to a depth of 10 cm, and a moderate amount of tissue was obtained and sent to pathology. The instruments were removed from the patient's vagina. Minimal bleeding from the cervix was noted. The patient tolerated the procedure well. Routine post-procedure instructions were given to the patient. The patient will follow up to review the results and for further management.     [place] Frieden L. Harraway-Smith, M.D., Cherlynn June

## 2018-11-01 DIAGNOSIS — I220 Subsequent ST elevation (STEMI) myocardial infarction of anterior wall: Secondary | ICD-10-CM | POA: Diagnosis not present

## 2018-11-02 ENCOUNTER — Telehealth: Payer: Self-pay

## 2018-11-02 ENCOUNTER — Other Ambulatory Visit: Payer: Self-pay

## 2018-11-02 ENCOUNTER — Other Ambulatory Visit: Payer: Self-pay | Admitting: *Deleted

## 2018-11-02 ENCOUNTER — Telehealth: Payer: Self-pay | Admitting: *Deleted

## 2018-11-02 DIAGNOSIS — I220 Subsequent ST elevation (STEMI) myocardial infarction of anterior wall: Secondary | ICD-10-CM | POA: Diagnosis not present

## 2018-11-02 DIAGNOSIS — M792 Neuralgia and neuritis, unspecified: Secondary | ICD-10-CM

## 2018-11-02 DIAGNOSIS — G8929 Other chronic pain: Secondary | ICD-10-CM

## 2018-11-02 DIAGNOSIS — I10 Essential (primary) hypertension: Secondary | ICD-10-CM

## 2018-11-02 DIAGNOSIS — Z853 Personal history of malignant neoplasm of breast: Secondary | ICD-10-CM

## 2018-11-02 MED ORDER — METHADONE HCL 5 MG PO TABS: 15.0000 mg | ORAL_TABLET | Freq: Three times a day (TID) | ORAL | 0 refills | Status: DC | PRN

## 2018-11-02 MED FILL — METHADONE HCL 5 MG TABLET: 5 | 30 days supply | Qty: 270 | Fill #0

## 2018-11-02 NOTE — Telephone Encounter (Signed)
Pt left VM stating she needed a refill on potassium ordered by [facility]/NP.  Note prescription was for limited amount.   This RN will forward request to above provider for further recommendations.

## 2018-11-02 NOTE — Telephone Encounter (Signed)
Called pt to let her know test results where WNL for her Endometrial Biopsy. Pt verbalized understanding.

## 2018-11-03 ENCOUNTER — Ambulatory Visit: Payer: Medicaid Other | Admitting: Physical Therapy

## 2018-11-03 ENCOUNTER — Other Ambulatory Visit: Payer: Self-pay | Admitting: Adult Health

## 2018-11-03 ENCOUNTER — Telehealth: Payer: Self-pay

## 2018-11-03 ENCOUNTER — Other Ambulatory Visit: Payer: Medicaid Other

## 2018-11-03 DIAGNOSIS — I220 Subsequent ST elevation (STEMI) myocardial infarction of anterior wall: Secondary | ICD-10-CM | POA: Diagnosis not present

## 2018-11-03 DIAGNOSIS — E876 Hypokalemia: Secondary | ICD-10-CM

## 2018-11-03 MED ORDER — POTASSIUM CHLORIDE CRYS ER 20 MEQ PO TBCR: 20.0000 meq | EXTENDED_RELEASE_TABLET | Freq: Every day | ORAL | 0 refills | Status: DC

## 2018-11-03 NOTE — Telephone Encounter (Signed)
It would be helpful to know: the initial prescription and when she ran out, if she has had any labs since, and some basic background information.  Cecille Rubin, can you look into this and let me know?  Thanks,   Mendel Ryder

## 2018-11-03 NOTE — Telephone Encounter (Signed)
Returned patient call regarding refill on potassium.  Per NP, patient needs to come in next week for lab to check potassium.  Potassium 20 Meq x 5 days sent to [facility].  Patient voiced understanding of above but did not schedule a lab appt, as she seemed rushed to get off the phone.

## 2018-11-04 ENCOUNTER — Other Ambulatory Visit: Payer: Self-pay | Admitting: Family Medicine

## 2018-11-04 DIAGNOSIS — I220 Subsequent ST elevation (STEMI) myocardial infarction of anterior wall: Secondary | ICD-10-CM | POA: Diagnosis not present

## 2018-11-05 DIAGNOSIS — I220 Subsequent ST elevation (STEMI) myocardial infarction of anterior wall: Secondary | ICD-10-CM | POA: Diagnosis not present

## 2018-11-06 DIAGNOSIS — I220 Subsequent ST elevation (STEMI) myocardial infarction of anterior wall: Secondary | ICD-10-CM | POA: Diagnosis not present

## 2018-11-07 DIAGNOSIS — I220 Subsequent ST elevation (STEMI) myocardial infarction of anterior wall: Secondary | ICD-10-CM | POA: Diagnosis not present

## 2018-11-08 DIAGNOSIS — I220 Subsequent ST elevation (STEMI) myocardial infarction of anterior wall: Secondary | ICD-10-CM | POA: Diagnosis not present

## 2018-11-09 ENCOUNTER — Inpatient Hospital Stay: Payer: Medicaid Other

## 2018-11-09 DIAGNOSIS — I220 Subsequent ST elevation (STEMI) myocardial infarction of anterior wall: Secondary | ICD-10-CM | POA: Diagnosis not present

## 2018-11-10 ENCOUNTER — Other Ambulatory Visit: Payer: Medicaid Other

## 2018-11-10 ENCOUNTER — Other Ambulatory Visit: Payer: Self-pay | Admitting: *Deleted

## 2018-11-10 DIAGNOSIS — I220 Subsequent ST elevation (STEMI) myocardial infarction of anterior wall: Secondary | ICD-10-CM | POA: Diagnosis not present

## 2018-11-11 DIAGNOSIS — I220 Subsequent ST elevation (STEMI) myocardial infarction of anterior wall: Secondary | ICD-10-CM | POA: Diagnosis not present

## 2018-11-12 DIAGNOSIS — I220 Subsequent ST elevation (STEMI) myocardial infarction of anterior wall: Secondary | ICD-10-CM | POA: Diagnosis not present

## 2018-11-13 DIAGNOSIS — I220 Subsequent ST elevation (STEMI) myocardial infarction of anterior wall: Secondary | ICD-10-CM | POA: Diagnosis not present

## 2018-11-14 ENCOUNTER — Inpatient Hospital Stay: Payer: Medicaid Other

## 2018-11-14 ENCOUNTER — Inpatient Hospital Stay: Payer: Medicaid Other | Admitting: Genetics

## 2018-11-14 ENCOUNTER — Telehealth: Payer: Self-pay | Admitting: Genetics

## 2018-11-14 DIAGNOSIS — I220 Subsequent ST elevation (STEMI) myocardial infarction of anterior wall: Secondary | ICD-10-CM | POA: Diagnosis not present

## 2018-11-14 NOTE — Telephone Encounter (Signed)
Called patient regarding missed appointment for genetic counseling/genetic testing on 11/14/2018.  Left my contact information if [name] would like to reschedule.

## 2018-11-15 ENCOUNTER — Other Ambulatory Visit: Payer: Medicaid Other

## 2018-11-15 ENCOUNTER — Encounter: Payer: Self-pay | Admitting: Genetics

## 2018-11-15 DIAGNOSIS — I220 Subsequent ST elevation (STEMI) myocardial infarction of anterior wall: Secondary | ICD-10-CM | POA: Diagnosis not present

## 2018-11-16 DIAGNOSIS — I220 Subsequent ST elevation (STEMI) myocardial infarction of anterior wall: Secondary | ICD-10-CM | POA: Diagnosis not present

## 2018-11-17 DIAGNOSIS — I220 Subsequent ST elevation (STEMI) myocardial infarction of anterior wall: Secondary | ICD-10-CM | POA: Diagnosis not present

## 2018-11-18 DIAGNOSIS — I220 Subsequent ST elevation (STEMI) myocardial infarction of anterior wall: Secondary | ICD-10-CM | POA: Diagnosis not present

## 2018-11-19 DIAGNOSIS — I220 Subsequent ST elevation (STEMI) myocardial infarction of anterior wall: Secondary | ICD-10-CM | POA: Diagnosis not present

## 2018-11-20 DIAGNOSIS — I220 Subsequent ST elevation (STEMI) myocardial infarction of anterior wall: Secondary | ICD-10-CM | POA: Diagnosis not present

## 2018-11-21 DIAGNOSIS — I220 Subsequent ST elevation (STEMI) myocardial infarction of anterior wall: Secondary | ICD-10-CM | POA: Diagnosis not present

## 2018-11-22 ENCOUNTER — Ambulatory Visit
Admission: RE | Admit: 2018-11-22 | Discharge: 2018-11-22 | Disposition: A | Discharge status: Home / Self Care | Payer: Medicaid Other | Source: Ambulatory Visit | Attending: Family Medicine | Admitting: Family Medicine

## 2018-11-22 DIAGNOSIS — M79661 Pain in right lower leg: Secondary | ICD-10-CM

## 2018-11-22 DIAGNOSIS — I220 Subsequent ST elevation (STEMI) myocardial infarction of anterior wall: Secondary | ICD-10-CM | POA: Diagnosis not present

## 2018-11-22 DIAGNOSIS — M79662 Pain in left lower leg: Secondary | ICD-10-CM | POA: Diagnosis not present

## 2018-11-23 DIAGNOSIS — I220 Subsequent ST elevation (STEMI) myocardial infarction of anterior wall: Secondary | ICD-10-CM | POA: Diagnosis not present

## 2018-11-24 DIAGNOSIS — I220 Subsequent ST elevation (STEMI) myocardial infarction of anterior wall: Secondary | ICD-10-CM | POA: Diagnosis not present

## 2018-11-25 DIAGNOSIS — I220 Subsequent ST elevation (STEMI) myocardial infarction of anterior wall: Secondary | ICD-10-CM | POA: Diagnosis not present

## 2018-11-26 DIAGNOSIS — I220 Subsequent ST elevation (STEMI) myocardial infarction of anterior wall: Secondary | ICD-10-CM | POA: Diagnosis not present

## 2018-11-27 DIAGNOSIS — I220 Subsequent ST elevation (STEMI) myocardial infarction of anterior wall: Secondary | ICD-10-CM | POA: Diagnosis not present

## 2018-11-28 ENCOUNTER — Telehealth: Payer: Self-pay | Admitting: Oncology

## 2018-11-28 DIAGNOSIS — M65341 Trigger finger, right ring finger: Secondary | ICD-10-CM | POA: Diagnosis not present

## 2018-11-28 DIAGNOSIS — I220 Subsequent ST elevation (STEMI) myocardial infarction of anterior wall: Secondary | ICD-10-CM | POA: Diagnosis not present

## 2018-11-28 DIAGNOSIS — G5603 Carpal tunnel syndrome, bilateral upper limbs: Secondary | ICD-10-CM | POA: Diagnosis not present

## 2018-11-28 NOTE — Telephone Encounter (Signed)
R/s appt per 12/9 sch message - pt is aware of new appt per 12/09 sch message

## 2018-11-29 DIAGNOSIS — I220 Subsequent ST elevation (STEMI) myocardial infarction of anterior wall: Secondary | ICD-10-CM | POA: Diagnosis not present

## 2018-11-30 ENCOUNTER — Inpatient Hospital Stay: Payer: Medicaid Other | Attending: Adult Health

## 2018-11-30 ENCOUNTER — Encounter: Payer: Self-pay | Admitting: Family Medicine

## 2018-11-30 ENCOUNTER — Ambulatory Visit (INDEPENDENT_AMBULATORY_CARE_PROVIDER_SITE_OTHER): Payer: Medicaid Other | Admitting: Family Medicine

## 2018-11-30 ENCOUNTER — Other Ambulatory Visit: Payer: Self-pay

## 2018-11-30 ENCOUNTER — Other Ambulatory Visit: Payer: Self-pay | Admitting: *Deleted

## 2018-11-30 ENCOUNTER — Other Ambulatory Visit: Payer: Self-pay | Admitting: Family Medicine

## 2018-11-30 VITALS — BP 112/76 | HR 69 | Temp 98.1°F | Ht 65.0 in | Wt 227.2 lb

## 2018-11-30 DIAGNOSIS — I10 Essential (primary) hypertension: Secondary | ICD-10-CM

## 2018-11-30 DIAGNOSIS — E876 Hypokalemia: Secondary | ICD-10-CM

## 2018-11-30 DIAGNOSIS — C50812 Malignant neoplasm of overlapping sites of left female breast: Secondary | ICD-10-CM | POA: Insufficient documentation

## 2018-11-30 DIAGNOSIS — Z853 Personal history of malignant neoplasm of breast: Secondary | ICD-10-CM

## 2018-11-30 DIAGNOSIS — C50911 Malignant neoplasm of unspecified site of right female breast: Secondary | ICD-10-CM | POA: Diagnosis not present

## 2018-11-30 DIAGNOSIS — M792 Neuralgia and neuritis, unspecified: Secondary | ICD-10-CM

## 2018-11-30 DIAGNOSIS — G8929 Other chronic pain: Secondary | ICD-10-CM

## 2018-11-30 DIAGNOSIS — I972 Postmastectomy lymphedema syndrome: Secondary | ICD-10-CM | POA: Diagnosis not present

## 2018-11-30 DIAGNOSIS — I220 Subsequent ST elevation (STEMI) myocardial infarction of anterior wall: Secondary | ICD-10-CM | POA: Diagnosis not present

## 2018-11-30 DIAGNOSIS — M1711 Unilateral primary osteoarthritis, right knee: Secondary | ICD-10-CM

## 2018-11-30 DIAGNOSIS — D473 Essential (hemorrhagic) thrombocythemia: Secondary | ICD-10-CM

## 2018-11-30 DIAGNOSIS — C50811 Malignant neoplasm of overlapping sites of right female breast: Secondary | ICD-10-CM

## 2018-11-30 DIAGNOSIS — D75839 Thrombocytosis, unspecified: Secondary | ICD-10-CM

## 2018-11-30 LAB — CBC WITH DIFFERENTIAL/PLATELET
Abs Immature Granulocytes: 0.07 10*3/uL (ref 0.00–0.07)
Basophils Absolute: 0 10*3/uL (ref 0.0–0.1)
Basophils Relative: 0 %
Eosinophils Absolute: 0.3 10*3/uL (ref 0.0–0.5)
Eosinophils Relative: 3 %
HCT: 40.7 % (ref 36.0–46.0)
Hemoglobin: 13.6 g/dL (ref 12.0–15.0)
Immature Granulocytes: 1 %
Lymphocytes Relative: 18 %
Lymphs Abs: 1.7 10*3/uL (ref 0.7–4.0)
MCH: 28.9 pg (ref 26.0–34.0)
MCHC: 33.4 g/dL (ref 30.0–36.0)
MCV: 86.4 fL (ref 80.0–100.0)
Monocytes Absolute: 0.5 10*3/uL (ref 0.1–1.0)
Monocytes Relative: 6 %
Neutro Abs: 6.5 10*3/uL (ref 1.7–7.7)
Neutrophils Relative %: 72 %
Platelets: 460 10*3/uL — ABNORMAL HIGH (ref 150–400)
RBC: 4.71 MIL/uL (ref 3.87–5.11)
RDW: 15.3 % (ref 11.5–15.5)
WBC: 9.1 10*3/uL (ref 4.0–10.5)
nRBC: 0 % (ref 0.0–0.2)

## 2018-11-30 LAB — COMPREHENSIVE METABOLIC PANEL
ALBUMIN: 3.5 g/dL (ref 3.5–5.0)
ALT: 17 U/L (ref 0–44)
AST: 19 U/L (ref 15–41)
Alkaline Phosphatase: 78 U/L (ref 38–126)
Anion gap: 13 (ref 5–15)
BUN: 15 mg/dL (ref 6–20)
CO2: 23 mmol/L (ref 22–32)
CREATININE: 0.86 mg/dL (ref 0.44–1.00)
Calcium: 9.1 mg/dL (ref 8.9–10.3)
Chloride: 107 mmol/L (ref 98–111)
GFR calc Af Amer: >60 mL/min (ref >60)
GFR calc non Af Amer: >60 mL/min (ref >60)
Glucose, Bld: 144 mg/dL — ABNORMAL HIGH (ref 70–99)
Potassium: 2.9 mmol/L — CL (ref 3.5–5.1)
Sodium: 143 mmol/L (ref 135–145)
Total Bilirubin: 0.7 mg/dL (ref 0.3–1.2)
Total Protein: 6.8 g/dL (ref 6.5–8.1)

## 2018-11-30 MED ORDER — ATORVASTATIN CALCIUM 40 MG PO TABS: 40.0000 mg | ORAL_TABLET | Freq: Every day | ORAL | 3 refills | Status: DC

## 2018-11-30 MED ORDER — POTASSIUM CHLORIDE CRYS ER 20 MEQ PO TBCR: 20.0000 meq | EXTENDED_RELEASE_TABLET | Freq: Two times a day (BID) | ORAL | 1 refills | Status: DC

## 2018-11-30 MED ORDER — METHADONE HCL 5 MG PO TABS: 15.0000 mg | ORAL_TABLET | Freq: Three times a day (TID) | ORAL | 0 refills | Status: DC | PRN

## 2018-11-30 MED FILL — METHADONE HCL 5 MG TABLET: 5 | 30 days supply | Qty: 270 | Fill #0

## 2018-11-30 NOTE — Patient Instructions (Signed)
It was nice meeting you today Ms. Paulding!  For your knee pain, please take Tylenol daily and ibuprofen only when your pain flares up.  For your hypokalemia, I am sending Klor-Con to your pharmacy.  Please take 2 pills at night with the rest of your medications.  You will also need to get your potassium checked by visiting our lab in about 1 month.  Please make this appointment as you exit clinic today.  Your elevated platelets have been in your chart for a while and are not getting worse, which is reassuring, but I am sending him my note from today to see if he would like to look into this further.  If you have any questions or concerns, please feel free to call the clinic.   Be well,  Dr. Shan Levans

## 2018-11-30 NOTE — Progress Notes (Signed)
Subjective:    [name] - 57 y.o. female MRN [id number]  Date of birth: 1961/02/25  CC: hypokalemia, R knee pain  HPI  [name] is here for follow-up of her hypokalemia and right knee pain.   Hypokalemia - triamterene-HCTZ is working well and would prefer to continue this medication - not taking potassium currently, and potassium has been low ever since stopping supplemental potassium -Patient sometimes feels fatigued, but she does not know if this is due to her potassium or not  R knee pain - follows with ortho and will see them next week - takes ibuprofen three times daily for pain and says that Tylenol does work for her as well - ultrasound of right leg on 12/4 negative for blood clots - might get a knee replacement next year -Says that she is motivated to work out at [employer], but would like a letter from me to help obtain financial assistance for a membership.  She received financial assistance in 2017 but did not take advantage of it, which she regrets  Chronic thrombocytosis -Noted increased platelet count that has been stable since at least 2016 -Says that she discussed this with 1 of the doctors at our clinic recently who said that they would get in touch with her oncologist, Dr. Jana [name], but she does not think that this was done -She sees Dr. Jana [name] for follow-up of her breast cancer, but she does not think he has discussed her platelet count with her -She would like for me to follow-up with him regarding this  Health Maintenance:   Health Maintenance Due  Topic Date Due  . MAMMOGRAM  10/02/2013  . COLONOSCOPY  10/02/2013  . PAP SMEAR-Modifier  09/16/2018    -  reports that she has been smoking cigarettes. She has never used smokeless tobacco. - Review of Systems: Per HPI. - Past Medical History: Patient Active Problem List   Diagnosis Date Noted  . Right leg pain 09/13/2018  . CAD (coronary artery disease) 02/08/2018  . Hyperlipidemia 02/08/2018   . History of ST elevation myocardial infarction (STEMI) 12/01/2017  . Endometrial hyperplasia, simple 11/08/2017  . Knee osteoarthritis 10/27/2017  . Uterine fibroid 10/03/2017  . Tinnitus 08/29/2017  . Abdominal mass 08/15/2017  . Frequent No-show for appointment 03/07/2017  . Elevated blood sugar 09/01/2016  . Bilateral breast cancer ([place])   . Memory loss 10/18/2015  . Thrombocytosis ([place]) 03/10/2015  . Tobacco use disorder 12/02/2014  . Cancer of overlapping sites of right female breast ([place]) 09/16/2014  . Breast cancer of upper-outer quadrant of left female breast ([place]) 09/16/2014  . Hot flashes due to tamoxifen 05/09/2014  . Lymphedema of arm 11/08/2013  . Chronic pain with drug dependence ([place]) 03/31/2012  . Hypokalemia 03/31/2012  . Atherosclerosis of aorta ([place])   . Essential hypertension   . Dyslipidemia    - Medications: reviewed and updated   Objective:   Physical Exam BP 112/76   Pulse 69   Temp 98.1 F (36.7 C) (Oral)   Ht 5\' 5"  (1.651 m)   Wt 227 lb 3.2 oz (103.1 kg)   LMP 08/07/2011   SpO2 93%   BMI 37.81 kg/m  Gen: NAD, alert, cooperative with exam, well-appearing, pleasant CV: RRR, good S1/S2, no murmur Resp: CTABL, no wheezes, non-labored Musculoskeletal: Right knee nontender to palpation, but patient walks slowly due to pain Skin: no rashes, normal turgor  Neuro: no gross deficits.  Psych: good insight, alert and oriented  Assessment & Plan:   Hypokalemia Since patient would like to continue triamterene-HCTZ and potassium was 2.9 today, will re-prescribe potassium supplementation 40 mEq daily.  Will need to remeasure a BMP in 1 month to see how she is responding to this dose of potassium.  Knee osteoarthritis Advised patient to take Tylenol daily rather than ibuprofen daily, since prolonged daily use of NSAIDs can lead to kidney disease and GI issues.  Recommended that patient take no more than 4 g of Tylenol per day and to use  ibuprofen only when she is having breakthrough pain.  Encouraged her to follow-up with orthopedics.  W[facility] and requesting [facility] to allow patient to have a membership there.  Thrombocytosis It appears that patient's thrombocytosis has been noted since at least 2016, but documentation is limited in her chart.  While her thrombocytosis has been reassuringly stable, she does have a history of malignancy, so her thrombocytosis may warrant further work-up.  Since she currently sees an oncologist, I will send him this note to see if he is interested in further work-up for this or has any advice for when to start work-up.    Maia Breslow, M.D. 12/01/2018, 7:48 AM PGY-2, [facility]

## 2018-12-01 DIAGNOSIS — I220 Subsequent ST elevation (STEMI) myocardial infarction of anterior wall: Secondary | ICD-10-CM | POA: Diagnosis not present

## 2018-12-01 NOTE — Assessment & Plan Note (Addendum)
It appears that patient's thrombocytosis has been noted since at least 2016, but documentation is limited in her chart.  While her thrombocytosis has been reassuringly stable, she does have a history of malignancy, so her thrombocytosis may warrant further work-up.  Since she currently sees an oncologist, I will send him this note to see if he is interested in further work-up for this or has any advice for when to start work-up.

## 2018-12-01 NOTE — Assessment & Plan Note (Addendum)
Since patient would like to continue triamterene-HCTZ and potassium was 2.9 today, will re-prescribe potassium supplementation 40 mEq daily.  Will need to remeasure a BMP in 1 month to see how she is responding to this dose of potassium.

## 2018-12-01 NOTE — Assessment & Plan Note (Signed)
Advised patient to take Tylenol daily rather than ibuprofen daily, since prolonged daily use of NSAIDs can lead to kidney disease and GI issues.  Recommended that patient take no more than 4 g of Tylenol per day and to use ibuprofen only when she is having breakthrough pain.  Encouraged her to follow-up with orthopedics.  Wrote patient a letter [facility] and requesting financial assistance to allow patient to have a membership there.

## 2018-12-02 DIAGNOSIS — I220 Subsequent ST elevation (STEMI) myocardial infarction of anterior wall: Secondary | ICD-10-CM | POA: Diagnosis not present

## 2018-12-03 DIAGNOSIS — I220 Subsequent ST elevation (STEMI) myocardial infarction of anterior wall: Secondary | ICD-10-CM | POA: Diagnosis not present

## 2018-12-04 DIAGNOSIS — I220 Subsequent ST elevation (STEMI) myocardial infarction of anterior wall: Secondary | ICD-10-CM | POA: Diagnosis not present

## 2018-12-05 DIAGNOSIS — I220 Subsequent ST elevation (STEMI) myocardial infarction of anterior wall: Secondary | ICD-10-CM | POA: Diagnosis not present

## 2018-12-06 ENCOUNTER — Other Ambulatory Visit: Payer: Self-pay | Admitting: Oncology

## 2018-12-06 DIAGNOSIS — I220 Subsequent ST elevation (STEMI) myocardial infarction of anterior wall: Secondary | ICD-10-CM | POA: Diagnosis not present

## 2018-12-07 ENCOUNTER — Emergency Department (HOSPITAL_COMMUNITY): Payer: Medicaid Other

## 2018-12-07 ENCOUNTER — Emergency Department (HOSPITAL_COMMUNITY)
Admission: EM | Admit: 2018-12-07 | Discharge: 2018-12-08 | Disposition: A | Discharge status: Home / Self Care | Payer: Medicaid Other | Attending: Emergency Medicine | Admitting: Emergency Medicine

## 2018-12-07 ENCOUNTER — Encounter (HOSPITAL_COMMUNITY): Payer: Self-pay

## 2018-12-07 ENCOUNTER — Other Ambulatory Visit: Payer: Self-pay

## 2018-12-07 DIAGNOSIS — Z7982 Long term (current) use of aspirin: Secondary | ICD-10-CM | POA: Diagnosis not present

## 2018-12-07 DIAGNOSIS — R42 Dizziness and giddiness: Secondary | ICD-10-CM | POA: Insufficient documentation

## 2018-12-07 DIAGNOSIS — M67431 Ganglion, right wrist: Secondary | ICD-10-CM | POA: Diagnosis not present

## 2018-12-07 DIAGNOSIS — I10 Essential (primary) hypertension: Secondary | ICD-10-CM | POA: Insufficient documentation

## 2018-12-07 DIAGNOSIS — F17210 Nicotine dependence, cigarettes, uncomplicated: Secondary | ICD-10-CM | POA: Insufficient documentation

## 2018-12-07 DIAGNOSIS — I220 Subsequent ST elevation (STEMI) myocardial infarction of anterior wall: Secondary | ICD-10-CM | POA: Diagnosis not present

## 2018-12-07 DIAGNOSIS — Z853 Personal history of malignant neoplasm of breast: Secondary | ICD-10-CM | POA: Insufficient documentation

## 2018-12-07 DIAGNOSIS — I2510 Atherosclerotic heart disease of native coronary artery without angina pectoris: Secondary | ICD-10-CM | POA: Diagnosis not present

## 2018-12-07 DIAGNOSIS — E876 Hypokalemia: Secondary | ICD-10-CM | POA: Insufficient documentation

## 2018-12-07 DIAGNOSIS — M79641 Pain in right hand: Secondary | ICD-10-CM | POA: Diagnosis present

## 2018-12-07 DIAGNOSIS — M7989 Other specified soft tissue disorders: Secondary | ICD-10-CM | POA: Diagnosis not present

## 2018-12-07 DIAGNOSIS — Z79899 Other long term (current) drug therapy: Secondary | ICD-10-CM | POA: Diagnosis not present

## 2018-12-07 LAB — CBC WITH DIFFERENTIAL/PLATELET
Abs Immature Granulocytes: 0.06 10*3/uL (ref 0.00–0.07)
BASOS ABS: 0.1 10*3/uL (ref 0.0–0.1)
Basophils Relative: 1 %
Eosinophils Absolute: 0.1 10*3/uL (ref 0.0–0.5)
Eosinophils Relative: 1 %
HCT: 43.3 % (ref 36.0–46.0)
Hemoglobin: 13.9 g/dL (ref 12.0–15.0)
Immature Granulocytes: 1 %
Lymphocytes Relative: 23 %
Lymphs Abs: 2.4 10*3/uL (ref 0.7–4.0)
MCH: 29 pg (ref 26.0–34.0)
MCHC: 32.1 g/dL (ref 30.0–36.0)
MCV: 90.4 fL (ref 80.0–100.0)
Monocytes Absolute: 0.6 10*3/uL (ref 0.1–1.0)
Monocytes Relative: 6 %
NRBC: 0 % (ref 0.0–0.2)
Neutro Abs: 7 10*3/uL (ref 1.7–7.7)
Neutrophils Relative %: 68 %
Platelets: 460 10*3/uL — ABNORMAL HIGH (ref 150–400)
RBC: 4.79 MIL/uL (ref 3.87–5.11)
RDW: 15.3 % (ref 11.5–15.5)
WBC: 10.2 10*3/uL (ref 4.0–10.5)

## 2018-12-07 LAB — BASIC METABOLIC PANEL
Anion gap: 14 (ref 5–15)
BUN: 13 mg/dL (ref 6–20)
CO2: 20 mmol/L — AB (ref 22–32)
Calcium: 9.2 mg/dL (ref 8.9–10.3)
Chloride: 106 mmol/L (ref 98–111)
Creatinine, Ser: 0.75 mg/dL (ref 0.44–1.00)
GFR calc non Af Amer: >60 mL/min (ref >60)
Glucose, Bld: 120 mg/dL — ABNORMAL HIGH (ref 70–99)
Potassium: 2.6 mmol/L — CL (ref 3.5–5.1)
Sodium: 140 mmol/L (ref 135–145)

## 2018-12-07 LAB — MAGNESIUM: Magnesium: 1.8 mg/dL (ref 1.7–2.4)

## 2018-12-07 MED ORDER — POTASSIUM CHLORIDE CRYS ER 20 MEQ PO TBCR
40.0000 meq | EXTENDED_RELEASE_TABLET | Freq: Once | ORAL | Status: AC
  Administered 2018-12-07: 40 meq via ORAL
  Filled 2018-12-07: qty 2

## 2018-12-07 MED ORDER — POTASSIUM CHLORIDE 10 MEQ/100ML IV SOLN
10.0000 meq | INTRAVENOUS | Status: AC
  Administered 2018-12-07 (×2): 10 meq via INTRAVENOUS
  Filled 2018-12-07 (×2): qty 100
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
## 2018-12-07 NOTE — Discharge Instructions (Addendum)
You were evaluated in the emergency department for new swelling on the back of your right wrist.  You had blood work and an x-ray.  This is likely a ganglion cyst which is of fluid pocket in the tendons.  It usually will improve with just a hand splint and some anti-inflammatories.  Please follow-up with your hand surgeon as scheduled on Monday.  Your potassium was also very low and we have given you some IV and oral potassium.  You should continue your supplement and contact your primary care doctor to schedule a repeat blood test soon.  Return if any concerns.

## 2018-12-07 NOTE — ED Triage Notes (Signed)
Pt states that she is having right hand cramping. Pt states that she is scheduled for trigger finger surgery. Pt states that the cramping is making her dizzy.

## 2018-12-08 DIAGNOSIS — I220 Subsequent ST elevation (STEMI) myocardial infarction of anterior wall: Secondary | ICD-10-CM | POA: Diagnosis not present

## 2018-12-09 DIAGNOSIS — I220 Subsequent ST elevation (STEMI) myocardial infarction of anterior wall: Secondary | ICD-10-CM | POA: Diagnosis not present

## 2018-12-10 DIAGNOSIS — I220 Subsequent ST elevation (STEMI) myocardial infarction of anterior wall: Secondary | ICD-10-CM | POA: Diagnosis not present

## 2018-12-11 DIAGNOSIS — I220 Subsequent ST elevation (STEMI) myocardial infarction of anterior wall: Secondary | ICD-10-CM | POA: Diagnosis not present

## 2018-12-12 DIAGNOSIS — I220 Subsequent ST elevation (STEMI) myocardial infarction of anterior wall: Secondary | ICD-10-CM | POA: Diagnosis not present

## 2018-12-13 DIAGNOSIS — I220 Subsequent ST elevation (STEMI) myocardial infarction of anterior wall: Secondary | ICD-10-CM | POA: Diagnosis not present

## 2018-12-14 ENCOUNTER — Other Ambulatory Visit: Payer: Self-pay | Admitting: Family Medicine

## 2018-12-14 DIAGNOSIS — I220 Subsequent ST elevation (STEMI) myocardial infarction of anterior wall: Secondary | ICD-10-CM | POA: Diagnosis not present

## 2018-12-15 ENCOUNTER — Other Ambulatory Visit: Payer: Self-pay | Admitting: Family Medicine

## 2018-12-15 DIAGNOSIS — M792 Neuralgia and neuritis, unspecified: Secondary | ICD-10-CM

## 2018-12-15 DIAGNOSIS — E876 Hypokalemia: Secondary | ICD-10-CM

## 2018-12-15 DIAGNOSIS — I220 Subsequent ST elevation (STEMI) myocardial infarction of anterior wall: Secondary | ICD-10-CM | POA: Diagnosis not present

## 2018-12-15 DIAGNOSIS — I10 Essential (primary) hypertension: Secondary | ICD-10-CM

## 2018-12-15 DIAGNOSIS — G8929 Other chronic pain: Secondary | ICD-10-CM

## 2018-12-15 DIAGNOSIS — Z853 Personal history of malignant neoplasm of breast: Secondary | ICD-10-CM

## 2018-12-15 MED ORDER — POTASSIUM CHLORIDE CRYS ER 20 MEQ PO TBCR: 20.0000 meq | EXTENDED_RELEASE_TABLET | Freq: Three times a day (TID) | ORAL | 1 refills | Status: DC

## 2018-12-15 MED ORDER — AMLODIPINE BESYLATE 10 MG PO TABS: 10.0000 mg | ORAL_TABLET | Freq: Every day | ORAL | 3 refills | Status: DC

## 2018-12-15 NOTE — Progress Notes (Signed)
BMP ordered

## 2018-12-15 NOTE — Telephone Encounter (Signed)
Called patient to let her know that I have been discussing the plan for work-up of her thrombocytosis with her oncologist, Dr. [name].  When we have a plan in place, I will let her know what that is.  She told me while on the phone that she recently went to the emergency room for a nodule on her hand but was found to have a potassium of 2.5 during that visit.  We decided to stop her triamterene-HCTZ and start amlodipine instead.  We will also increase her potassium supplement to 20 mEq 3 times daily.  I have made a lab appointment for her on Monday, December 30 to get a BMP.

## 2018-12-16 DIAGNOSIS — I220 Subsequent ST elevation (STEMI) myocardial infarction of anterior wall: Secondary | ICD-10-CM | POA: Diagnosis not present

## 2018-12-17 DIAGNOSIS — I220 Subsequent ST elevation (STEMI) myocardial infarction of anterior wall: Secondary | ICD-10-CM | POA: Diagnosis not present

## 2018-12-18 ENCOUNTER — Other Ambulatory Visit: Payer: Self-pay | Admitting: Oncology

## 2018-12-18 ENCOUNTER — Other Ambulatory Visit: Payer: Medicaid Other

## 2018-12-18 DIAGNOSIS — D75839 Thrombocytosis, unspecified: Secondary | ICD-10-CM

## 2018-12-18 DIAGNOSIS — E876 Hypokalemia: Secondary | ICD-10-CM

## 2018-12-18 DIAGNOSIS — D473 Essential (hemorrhagic) thrombocythemia: Secondary | ICD-10-CM

## 2018-12-18 DIAGNOSIS — I220 Subsequent ST elevation (STEMI) myocardial infarction of anterior wall: Secondary | ICD-10-CM | POA: Diagnosis not present

## 2018-12-19 DIAGNOSIS — I220 Subsequent ST elevation (STEMI) myocardial infarction of anterior wall: Secondary | ICD-10-CM | POA: Diagnosis not present

## 2018-12-19 LAB — BASIC METABOLIC PANEL
BUN/Creatinine Ratio: 14 (ref 9–23)
BUN: 11 mg/dL (ref 6–24)
CO2: 19 mmol/L — ABNORMAL LOW (ref 20–29)
Calcium: 8.5 mg/dL — ABNORMAL LOW (ref 8.7–10.2)
Chloride: 110 mmol/L — ABNORMAL HIGH (ref 96–106)
Creatinine, Ser: 0.77 mg/dL (ref 0.57–1.00)
GFR calc Af Amer: 101 mL/min/{1.73_m2} (ref >59)
GFR calc non Af Amer: 87 mL/min/{1.73_m2} (ref >59)
Glucose: 125 mg/dL — ABNORMAL HIGH (ref 65–99)
POTASSIUM: 3.8 mmol/L (ref 3.5–5.2)
Sodium: 145 mmol/L — ABNORMAL HIGH (ref 134–144)

## 2018-12-20 DIAGNOSIS — I220 Subsequent ST elevation (STEMI) myocardial infarction of anterior wall: Secondary | ICD-10-CM | POA: Diagnosis not present

## 2018-12-21 ENCOUNTER — Telehealth: Payer: Self-pay

## 2018-12-21 ENCOUNTER — Other Ambulatory Visit: Payer: Self-pay | Admitting: Family Medicine

## 2018-12-21 DIAGNOSIS — I220 Subsequent ST elevation (STEMI) myocardial infarction of anterior wall: Secondary | ICD-10-CM | POA: Diagnosis not present

## 2018-12-21 MED ORDER — RAISED TOILET SEAT MISC: 1.0000 | 0 refills | Status: DC | PRN

## 2018-12-21 NOTE — Telephone Encounter (Signed)
Rx printed and placed up front for patient/family pickup

## 2018-12-21 NOTE — Telephone Encounter (Signed)
Marolyn Haller, nurse care manager with [facility] [place], left message requesting an order be sent to a DME company for an elevated toilet seat with handles that patient has requested.  [facility] call back is [phone]  Danley Danker, RN [facility] RN)

## 2018-12-22 DIAGNOSIS — I220 Subsequent ST elevation (STEMI) myocardial infarction of anterior wall: Secondary | ICD-10-CM | POA: Diagnosis not present

## 2018-12-22 NOTE — Telephone Encounter (Signed)
Patient informed. Pt will be here Monday to get the Rx. Ottis Stain, CMA

## 2018-12-23 DIAGNOSIS — I220 Subsequent ST elevation (STEMI) myocardial infarction of anterior wall: Secondary | ICD-10-CM | POA: Diagnosis not present

## 2018-12-24 DIAGNOSIS — I220 Subsequent ST elevation (STEMI) myocardial infarction of anterior wall: Secondary | ICD-10-CM | POA: Diagnosis not present

## 2018-12-25 DIAGNOSIS — G5603 Carpal tunnel syndrome, bilateral upper limbs: Secondary | ICD-10-CM | POA: Diagnosis not present

## 2018-12-25 DIAGNOSIS — I220 Subsequent ST elevation (STEMI) myocardial infarction of anterior wall: Secondary | ICD-10-CM | POA: Diagnosis not present

## 2018-12-26 DIAGNOSIS — I220 Subsequent ST elevation (STEMI) myocardial infarction of anterior wall: Secondary | ICD-10-CM | POA: Diagnosis not present

## 2018-12-26 DIAGNOSIS — M65341 Trigger finger, right ring finger: Secondary | ICD-10-CM | POA: Diagnosis not present

## 2018-12-26 DIAGNOSIS — G5601 Carpal tunnel syndrome, right upper limb: Secondary | ICD-10-CM | POA: Diagnosis not present

## 2018-12-26 DIAGNOSIS — M65332 Trigger finger, left middle finger: Secondary | ICD-10-CM | POA: Diagnosis not present

## 2018-12-26 DIAGNOSIS — G5602 Carpal tunnel syndrome, left upper limb: Secondary | ICD-10-CM | POA: Diagnosis not present

## 2018-12-27 ENCOUNTER — Telehealth: Payer: Self-pay

## 2018-12-27 ENCOUNTER — Telehealth: Payer: Self-pay | Admitting: *Deleted

## 2018-12-27 ENCOUNTER — Other Ambulatory Visit: Payer: Self-pay | Admitting: Oncology

## 2018-12-27 ENCOUNTER — Inpatient Hospital Stay: Payer: Medicaid Other | Admitting: Genetic Counselor

## 2018-12-27 ENCOUNTER — Inpatient Hospital Stay: Payer: Medicaid Other

## 2018-12-27 DIAGNOSIS — I10 Essential (primary) hypertension: Secondary | ICD-10-CM

## 2018-12-27 DIAGNOSIS — G8929 Other chronic pain: Secondary | ICD-10-CM

## 2018-12-27 DIAGNOSIS — Z853 Personal history of malignant neoplasm of breast: Secondary | ICD-10-CM

## 2018-12-27 DIAGNOSIS — I220 Subsequent ST elevation (STEMI) myocardial infarction of anterior wall: Secondary | ICD-10-CM | POA: Diagnosis not present

## 2018-12-27 DIAGNOSIS — M792 Neuralgia and neuritis, unspecified: Secondary | ICD-10-CM

## 2018-12-27 MED ORDER — METHADONE HCL 5 MG PO TABS: 15.0000 mg | ORAL_TABLET | Freq: Three times a day (TID) | ORAL | 0 refills | Status: DC | PRN

## 2018-12-27 NOTE — Telephone Encounter (Signed)
Returned patient's call.  Pt needs Rx for Methadone cancelled at [facility] and resent to [facility] due to availability.  Dr. [name] will resent to [facility].  Nurse called to have Rx cancelled at [facility].  Pt prefers all Rx for Methadone be sent to [place].

## 2018-12-27 NOTE — Telephone Encounter (Signed)
Informed pt of below after clarifying with Dr. Shan Levans that the medication mentioned in the previous note was not on current med list.  Dr. Shan Levans said that they had discontinued this medication.  Informed pt to continue doing what she is doing per Dr. Shan Levans. [name], April D, [place]

## 2018-12-27 NOTE — Telephone Encounter (Signed)
-----   Message from Rory Percy, DO sent at 12/19/2018  4:34 PM EST ----- Please let patient know her potassium is improved, she should continue to take her triamterene-HCTZ and f/u with Dr. Shan Levans as scheduled.

## 2018-12-28 ENCOUNTER — Inpatient Hospital Stay: Payer: Medicaid Other

## 2018-12-28 ENCOUNTER — Inpatient Hospital Stay: Payer: Medicaid Other | Admitting: Licensed Clinical Social Worker

## 2018-12-28 DIAGNOSIS — I220 Subsequent ST elevation (STEMI) myocardial infarction of anterior wall: Secondary | ICD-10-CM | POA: Diagnosis not present

## 2018-12-28 MED FILL — METHADONE HCL 5 MG TABLET: 5 | 30 days supply | Qty: 270 | Fill #0

## 2018-12-29 DIAGNOSIS — M1711 Unilateral primary osteoarthritis, right knee: Secondary | ICD-10-CM | POA: Diagnosis not present

## 2018-12-29 DIAGNOSIS — I220 Subsequent ST elevation (STEMI) myocardial infarction of anterior wall: Secondary | ICD-10-CM | POA: Diagnosis not present

## 2018-12-29 DIAGNOSIS — M1712 Unilateral primary osteoarthritis, left knee: Secondary | ICD-10-CM | POA: Diagnosis not present

## 2018-12-30 DIAGNOSIS — I220 Subsequent ST elevation (STEMI) myocardial infarction of anterior wall: Secondary | ICD-10-CM | POA: Diagnosis not present

## 2018-12-31 DIAGNOSIS — I220 Subsequent ST elevation (STEMI) myocardial infarction of anterior wall: Secondary | ICD-10-CM | POA: Diagnosis not present

## 2019-01-01 ENCOUNTER — Ambulatory Visit: Payer: Medicaid Other

## 2019-01-01 ENCOUNTER — Other Ambulatory Visit: Payer: Self-pay | Admitting: Family Medicine

## 2019-01-01 DIAGNOSIS — E876 Hypokalemia: Secondary | ICD-10-CM

## 2019-01-01 DIAGNOSIS — I220 Subsequent ST elevation (STEMI) myocardial infarction of anterior wall: Secondary | ICD-10-CM | POA: Diagnosis not present

## 2019-01-02 ENCOUNTER — Other Ambulatory Visit: Payer: Medicaid Other

## 2019-01-02 DIAGNOSIS — I220 Subsequent ST elevation (STEMI) myocardial infarction of anterior wall: Secondary | ICD-10-CM | POA: Diagnosis not present

## 2019-01-03 ENCOUNTER — Other Ambulatory Visit: Payer: Medicaid Other

## 2019-01-03 DIAGNOSIS — I220 Subsequent ST elevation (STEMI) myocardial infarction of anterior wall: Secondary | ICD-10-CM | POA: Diagnosis not present

## 2019-01-04 DIAGNOSIS — I220 Subsequent ST elevation (STEMI) myocardial infarction of anterior wall: Secondary | ICD-10-CM | POA: Diagnosis not present

## 2019-01-05 DIAGNOSIS — I220 Subsequent ST elevation (STEMI) myocardial infarction of anterior wall: Secondary | ICD-10-CM | POA: Diagnosis not present

## 2019-01-06 DIAGNOSIS — I220 Subsequent ST elevation (STEMI) myocardial infarction of anterior wall: Secondary | ICD-10-CM | POA: Diagnosis not present

## 2019-01-07 DIAGNOSIS — I220 Subsequent ST elevation (STEMI) myocardial infarction of anterior wall: Secondary | ICD-10-CM | POA: Diagnosis not present

## 2019-01-08 DIAGNOSIS — I220 Subsequent ST elevation (STEMI) myocardial infarction of anterior wall: Secondary | ICD-10-CM | POA: Diagnosis not present

## 2019-01-09 DIAGNOSIS — I220 Subsequent ST elevation (STEMI) myocardial infarction of anterior wall: Secondary | ICD-10-CM | POA: Diagnosis not present

## 2019-01-10 DIAGNOSIS — I220 Subsequent ST elevation (STEMI) myocardial infarction of anterior wall: Secondary | ICD-10-CM | POA: Diagnosis not present

## 2019-01-11 DIAGNOSIS — I220 Subsequent ST elevation (STEMI) myocardial infarction of anterior wall: Secondary | ICD-10-CM | POA: Diagnosis not present

## 2019-01-12 DIAGNOSIS — I220 Subsequent ST elevation (STEMI) myocardial infarction of anterior wall: Secondary | ICD-10-CM | POA: Diagnosis not present

## 2019-01-13 DIAGNOSIS — I220 Subsequent ST elevation (STEMI) myocardial infarction of anterior wall: Secondary | ICD-10-CM | POA: Diagnosis not present

## 2019-01-14 DIAGNOSIS — I220 Subsequent ST elevation (STEMI) myocardial infarction of anterior wall: Secondary | ICD-10-CM | POA: Diagnosis not present

## 2019-01-15 ENCOUNTER — Telehealth: Payer: Self-pay | Admitting: *Deleted

## 2019-01-15 DIAGNOSIS — I220 Subsequent ST elevation (STEMI) myocardial infarction of anterior wall: Secondary | ICD-10-CM | POA: Diagnosis not present

## 2019-01-15 NOTE — Telephone Encounter (Signed)
Medical records faxed to [facility]; RI# [id number]

## 2019-01-16 DIAGNOSIS — I220 Subsequent ST elevation (STEMI) myocardial infarction of anterior wall: Secondary | ICD-10-CM | POA: Diagnosis not present

## 2019-01-17 DIAGNOSIS — I220 Subsequent ST elevation (STEMI) myocardial infarction of anterior wall: Secondary | ICD-10-CM | POA: Diagnosis not present

## 2019-01-18 DIAGNOSIS — I220 Subsequent ST elevation (STEMI) myocardial infarction of anterior wall: Secondary | ICD-10-CM | POA: Diagnosis not present

## 2019-01-19 ENCOUNTER — Ambulatory Visit: Payer: Medicaid Other | Admitting: Family Medicine

## 2019-01-19 DIAGNOSIS — I220 Subsequent ST elevation (STEMI) myocardial infarction of anterior wall: Secondary | ICD-10-CM | POA: Diagnosis not present

## 2019-01-20 DIAGNOSIS — I220 Subsequent ST elevation (STEMI) myocardial infarction of anterior wall: Secondary | ICD-10-CM | POA: Diagnosis not present

## 2019-01-21 DIAGNOSIS — I220 Subsequent ST elevation (STEMI) myocardial infarction of anterior wall: Secondary | ICD-10-CM | POA: Diagnosis not present

## 2019-01-22 DIAGNOSIS — I220 Subsequent ST elevation (STEMI) myocardial infarction of anterior wall: Secondary | ICD-10-CM | POA: Diagnosis not present

## 2019-01-23 DIAGNOSIS — I220 Subsequent ST elevation (STEMI) myocardial infarction of anterior wall: Secondary | ICD-10-CM | POA: Diagnosis not present

## 2019-01-24 ENCOUNTER — Other Ambulatory Visit: Payer: Self-pay | Admitting: Oncology

## 2019-01-24 ENCOUNTER — Telehealth: Payer: Self-pay

## 2019-01-24 DIAGNOSIS — G8929 Other chronic pain: Secondary | ICD-10-CM

## 2019-01-24 DIAGNOSIS — I890 Lymphedema, not elsewhere classified: Secondary | ICD-10-CM

## 2019-01-24 DIAGNOSIS — I220 Subsequent ST elevation (STEMI) myocardial infarction of anterior wall: Secondary | ICD-10-CM | POA: Diagnosis not present

## 2019-01-24 DIAGNOSIS — I10 Essential (primary) hypertension: Secondary | ICD-10-CM

## 2019-01-24 DIAGNOSIS — Z853 Personal history of malignant neoplasm of breast: Secondary | ICD-10-CM

## 2019-01-24 DIAGNOSIS — M792 Neuralgia and neuritis, unspecified: Secondary | ICD-10-CM

## 2019-01-24 MED ORDER — METHADONE HCL 5 MG PO TABS: 15.0000 mg | ORAL_TABLET | Freq: Three times a day (TID) | ORAL | 0 refills | Status: DC | PRN

## 2019-01-24 NOTE — Telephone Encounter (Signed)
Pt voiced concerns with area to left breast/axillary.  Pt stated site where she had the lumpectomy prior has changed in appearance and scabbing with bleeding.    Pt denies any redness, streaking, or swelling to area.  Voiced mild discomfort when pressure is applied.  No fevers.  Pt is unable to feel any lumps or changes.  Requesting to see Dr. [name].  Apt scheduled.    Pt requesting refill on Methadone as well.  Will forward to provider for review/approval.    Pt requesting new referral placed for lymphedema clinic due to insurance reasons.  Will place referral.  Pt voiced no further needs.

## 2019-01-25 DIAGNOSIS — I220 Subsequent ST elevation (STEMI) myocardial infarction of anterior wall: Secondary | ICD-10-CM | POA: Diagnosis not present

## 2019-01-25 MED FILL — METHADONE HCL 5 MG TABLET: 5 | 30 days supply | Qty: 270 | Fill #0

## 2019-01-26 ENCOUNTER — Ambulatory Visit: Payer: Medicaid Other | Admitting: Family Medicine

## 2019-01-26 DIAGNOSIS — I220 Subsequent ST elevation (STEMI) myocardial infarction of anterior wall: Secondary | ICD-10-CM | POA: Diagnosis not present
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
## 2019-01-27 DIAGNOSIS — I220 Subsequent ST elevation (STEMI) myocardial infarction of anterior wall: Secondary | ICD-10-CM | POA: Diagnosis not present

## 2019-01-28 DIAGNOSIS — I220 Subsequent ST elevation (STEMI) myocardial infarction of anterior wall: Secondary | ICD-10-CM | POA: Diagnosis not present

## 2019-01-29 ENCOUNTER — Inpatient Hospital Stay: Payer: Medicaid Other | Admitting: Oncology

## 2019-01-29 DIAGNOSIS — I220 Subsequent ST elevation (STEMI) myocardial infarction of anterior wall: Secondary | ICD-10-CM | POA: Diagnosis not present

## 2019-01-30 DIAGNOSIS — I220 Subsequent ST elevation (STEMI) myocardial infarction of anterior wall: Secondary | ICD-10-CM | POA: Diagnosis not present

## 2019-01-31 DIAGNOSIS — I220 Subsequent ST elevation (STEMI) myocardial infarction of anterior wall: Secondary | ICD-10-CM | POA: Diagnosis not present

## 2019-02-01 ENCOUNTER — Other Ambulatory Visit: Payer: Self-pay | Admitting: Family Medicine

## 2019-02-01 DIAGNOSIS — E876 Hypokalemia: Secondary | ICD-10-CM

## 2019-02-01 DIAGNOSIS — I220 Subsequent ST elevation (STEMI) myocardial infarction of anterior wall: Secondary | ICD-10-CM | POA: Diagnosis not present

## 2019-02-02 DIAGNOSIS — I220 Subsequent ST elevation (STEMI) myocardial infarction of anterior wall: Secondary | ICD-10-CM | POA: Diagnosis not present

## 2019-02-03 DIAGNOSIS — I220 Subsequent ST elevation (STEMI) myocardial infarction of anterior wall: Secondary | ICD-10-CM | POA: Diagnosis not present

## 2019-02-04 DIAGNOSIS — I220 Subsequent ST elevation (STEMI) myocardial infarction of anterior wall: Secondary | ICD-10-CM | POA: Diagnosis not present

## 2019-02-05 DIAGNOSIS — I220 Subsequent ST elevation (STEMI) myocardial infarction of anterior wall: Secondary | ICD-10-CM | POA: Diagnosis not present

## 2019-02-06 ENCOUNTER — Ambulatory Visit: Payer: Medicaid Other | Admitting: Physical Therapy

## 2019-02-06 DIAGNOSIS — I220 Subsequent ST elevation (STEMI) myocardial infarction of anterior wall: Secondary | ICD-10-CM | POA: Diagnosis not present

## 2019-02-07 ENCOUNTER — Telehealth: Payer: Self-pay

## 2019-02-07 ENCOUNTER — Telehealth: Payer: Self-pay | Admitting: Adult Health

## 2019-02-07 ENCOUNTER — Inpatient Hospital Stay: Payer: Medicaid Other | Admitting: Adult Health

## 2019-02-07 DIAGNOSIS — I220 Subsequent ST elevation (STEMI) myocardial infarction of anterior wall: Secondary | ICD-10-CM | POA: Diagnosis not present

## 2019-02-07 NOTE — Telephone Encounter (Signed)
Pt left voicemail to requesting to reschedule appointment d/t cold like symptoms.    Nurse returned call.  Left voicemail informing pt scheduling message would be sent.

## 2019-02-07 NOTE — Telephone Encounter (Signed)
R/s appt per 2/19 sch message - pt is aware of appt date and time
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
## 2019-02-08 ENCOUNTER — Encounter: Payer: Self-pay | Admitting: Physical Therapy

## 2019-02-08 ENCOUNTER — Ambulatory Visit: Payer: Medicaid Other | Attending: Oncology | Admitting: Physical Therapy

## 2019-02-08 ENCOUNTER — Other Ambulatory Visit: Payer: Self-pay

## 2019-02-08 DIAGNOSIS — R293 Abnormal posture: Secondary | ICD-10-CM | POA: Insufficient documentation

## 2019-02-08 DIAGNOSIS — M6281 Muscle weakness (generalized): Secondary | ICD-10-CM | POA: Diagnosis not present

## 2019-02-08 DIAGNOSIS — I220 Subsequent ST elevation (STEMI) myocardial infarction of anterior wall: Secondary | ICD-10-CM | POA: Diagnosis not present

## 2019-02-08 DIAGNOSIS — I972 Postmastectomy lymphedema syndrome: Secondary | ICD-10-CM
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
## 2019-02-09 DIAGNOSIS — I220 Subsequent ST elevation (STEMI) myocardial infarction of anterior wall: Secondary | ICD-10-CM | POA: Diagnosis not present

## 2019-02-10 DIAGNOSIS — I220 Subsequent ST elevation (STEMI) myocardial infarction of anterior wall: Secondary | ICD-10-CM | POA: Diagnosis not present

## 2019-02-11 DIAGNOSIS — I220 Subsequent ST elevation (STEMI) myocardial infarction of anterior wall: Secondary | ICD-10-CM | POA: Diagnosis not present

## 2019-02-12 DIAGNOSIS — I220 Subsequent ST elevation (STEMI) myocardial infarction of anterior wall: Secondary | ICD-10-CM | POA: Diagnosis not present

## 2019-02-13 ENCOUNTER — Inpatient Hospital Stay: Payer: Medicaid Other | Attending: Adult Health | Admitting: Adult Health

## 2019-02-13 ENCOUNTER — Encounter: Payer: Self-pay | Admitting: Adult Health

## 2019-02-13 VITALS — BP 103/60 | HR 77 | Temp 97.7°F | Resp 18 | Ht 65.0 in | Wt 228.8 lb

## 2019-02-13 DIAGNOSIS — Z7982 Long term (current) use of aspirin: Secondary | ICD-10-CM | POA: Diagnosis not present

## 2019-02-13 DIAGNOSIS — Z170 Estrogen receptor positive status [ER+]: Secondary | ICD-10-CM | POA: Diagnosis not present

## 2019-02-13 DIAGNOSIS — Z79899 Other long term (current) drug therapy: Secondary | ICD-10-CM | POA: Diagnosis not present

## 2019-02-13 DIAGNOSIS — C50412 Malignant neoplasm of upper-outer quadrant of left female breast: Secondary | ICD-10-CM

## 2019-02-13 DIAGNOSIS — C50811 Malignant neoplasm of overlapping sites of right female breast: Secondary | ICD-10-CM

## 2019-02-13 DIAGNOSIS — Z79810 Long term (current) use of selective estrogen receptor modulators (SERMs): Secondary | ICD-10-CM | POA: Diagnosis not present

## 2019-02-13 DIAGNOSIS — F17210 Nicotine dependence, cigarettes, uncomplicated: Secondary | ICD-10-CM | POA: Insufficient documentation

## 2019-02-13 DIAGNOSIS — I220 Subsequent ST elevation (STEMI) myocardial infarction of anterior wall: Secondary | ICD-10-CM | POA: Diagnosis not present

## 2019-02-13 DIAGNOSIS — C50911 Malignant neoplasm of unspecified site of right female breast: Secondary | ICD-10-CM | POA: Diagnosis present

## 2019-02-13 DIAGNOSIS — Z9013 Acquired absence of bilateral breasts and nipples: Secondary | ICD-10-CM | POA: Diagnosis not present

## 2019-02-13 DIAGNOSIS — I10 Essential (primary) hypertension: Secondary | ICD-10-CM | POA: Diagnosis not present

## 2019-02-13 DIAGNOSIS — R7989 Other specified abnormal findings of blood chemistry: Secondary | ICD-10-CM | POA: Diagnosis not present

## 2019-02-13 DIAGNOSIS — C50912 Malignant neoplasm of unspecified site of left female breast: Secondary | ICD-10-CM | POA: Diagnosis not present
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
## 2019-02-14 DIAGNOSIS — I220 Subsequent ST elevation (STEMI) myocardial infarction of anterior wall: Secondary | ICD-10-CM | POA: Diagnosis not present

## 2019-02-15 DIAGNOSIS — I220 Subsequent ST elevation (STEMI) myocardial infarction of anterior wall: Secondary | ICD-10-CM | POA: Diagnosis not present

## 2019-02-16 DIAGNOSIS — I220 Subsequent ST elevation (STEMI) myocardial infarction of anterior wall: Secondary | ICD-10-CM | POA: Diagnosis not present

## 2019-02-17 DIAGNOSIS — I220 Subsequent ST elevation (STEMI) myocardial infarction of anterior wall: Secondary | ICD-10-CM | POA: Diagnosis not present

## 2019-02-18 DIAGNOSIS — I220 Subsequent ST elevation (STEMI) myocardial infarction of anterior wall: Secondary | ICD-10-CM | POA: Diagnosis not present

## 2019-02-19 DIAGNOSIS — I220 Subsequent ST elevation (STEMI) myocardial infarction of anterior wall: Secondary | ICD-10-CM | POA: Diagnosis not present

## 2019-02-20 DIAGNOSIS — I220 Subsequent ST elevation (STEMI) myocardial infarction of anterior wall: Secondary | ICD-10-CM | POA: Diagnosis not present

## 2019-02-21 ENCOUNTER — Ambulatory Visit: Payer: Medicaid Other | Admitting: Physical Therapy

## 2019-02-21 ENCOUNTER — Other Ambulatory Visit: Payer: Self-pay | Admitting: Oncology

## 2019-02-21 DIAGNOSIS — I10 Essential (primary) hypertension: Secondary | ICD-10-CM

## 2019-02-21 DIAGNOSIS — G8929 Other chronic pain: Secondary | ICD-10-CM

## 2019-02-21 DIAGNOSIS — I220 Subsequent ST elevation (STEMI) myocardial infarction of anterior wall: Secondary | ICD-10-CM | POA: Diagnosis not present

## 2019-02-21 DIAGNOSIS — Z853 Personal history of malignant neoplasm of breast: Secondary | ICD-10-CM

## 2019-02-21 DIAGNOSIS — M792 Neuralgia and neuritis, unspecified: Secondary | ICD-10-CM

## 2019-02-21 MED ORDER — METHADONE HCL 5 MG PO TABS: 15.0000 mg | ORAL_TABLET | Freq: Three times a day (TID) | ORAL | 0 refills | Status: DC | PRN

## 2019-02-22 DIAGNOSIS — I220 Subsequent ST elevation (STEMI) myocardial infarction of anterior wall: Secondary | ICD-10-CM | POA: Diagnosis not present

## 2019-02-22 MED FILL — METHADONE HCL 5 MG TABLET: 5 | 30 days supply | Qty: 270 | Fill #0

## 2019-02-23 DIAGNOSIS — I220 Subsequent ST elevation (STEMI) myocardial infarction of anterior wall: Secondary | ICD-10-CM | POA: Diagnosis not present

## 2019-02-24 DIAGNOSIS — I220 Subsequent ST elevation (STEMI) myocardial infarction of anterior wall: Secondary | ICD-10-CM | POA: Diagnosis not present

## 2019-02-25 DIAGNOSIS — I220 Subsequent ST elevation (STEMI) myocardial infarction of anterior wall: Secondary | ICD-10-CM | POA: Diagnosis not present

## 2019-02-26 DIAGNOSIS — I220 Subsequent ST elevation (STEMI) myocardial infarction of anterior wall: Secondary | ICD-10-CM | POA: Diagnosis not present

## 2019-02-27 DIAGNOSIS — I220 Subsequent ST elevation (STEMI) myocardial infarction of anterior wall: Secondary | ICD-10-CM | POA: Diagnosis not present

## 2019-02-28 ENCOUNTER — Encounter: Payer: Medicaid Other | Admitting: Physical Therapy

## 2019-02-28 DIAGNOSIS — I220 Subsequent ST elevation (STEMI) myocardial infarction of anterior wall: Secondary | ICD-10-CM | POA: Diagnosis not present

## 2019-03-01 DIAGNOSIS — I220 Subsequent ST elevation (STEMI) myocardial infarction of anterior wall: Secondary | ICD-10-CM | POA: Diagnosis not present

## 2019-03-02 DIAGNOSIS — I220 Subsequent ST elevation (STEMI) myocardial infarction of anterior wall: Secondary | ICD-10-CM | POA: Diagnosis not present

## 2019-03-03 DIAGNOSIS — I220 Subsequent ST elevation (STEMI) myocardial infarction of anterior wall: Secondary | ICD-10-CM | POA: Diagnosis not present

## 2019-03-04 DIAGNOSIS — I220 Subsequent ST elevation (STEMI) myocardial infarction of anterior wall: Secondary | ICD-10-CM | POA: Diagnosis not present

## 2019-03-05 DIAGNOSIS — I220 Subsequent ST elevation (STEMI) myocardial infarction of anterior wall: Secondary | ICD-10-CM | POA: Diagnosis not present

## 2019-03-06 ENCOUNTER — Ambulatory Visit: Payer: Medicaid Other | Attending: Oncology

## 2019-03-06 DIAGNOSIS — I972 Postmastectomy lymphedema syndrome: Secondary | ICD-10-CM | POA: Insufficient documentation

## 2019-03-06 DIAGNOSIS — R293 Abnormal posture: Secondary | ICD-10-CM | POA: Insufficient documentation

## 2019-03-06 DIAGNOSIS — I220 Subsequent ST elevation (STEMI) myocardial infarction of anterior wall: Secondary | ICD-10-CM | POA: Diagnosis not present

## 2019-03-06 DIAGNOSIS — M6281 Muscle weakness (generalized): Secondary | ICD-10-CM | POA: Insufficient documentation

## 2019-03-07 ENCOUNTER — Other Ambulatory Visit: Payer: Self-pay

## 2019-03-07 ENCOUNTER — Ambulatory Visit: Payer: Medicaid Other

## 2019-03-07 DIAGNOSIS — I220 Subsequent ST elevation (STEMI) myocardial infarction of anterior wall: Secondary | ICD-10-CM | POA: Diagnosis not present

## 2019-03-07 DIAGNOSIS — M6281 Muscle weakness (generalized): Secondary | ICD-10-CM | POA: Diagnosis not present

## 2019-03-07 DIAGNOSIS — I972 Postmastectomy lymphedema syndrome: Secondary | ICD-10-CM | POA: Diagnosis not present

## 2019-03-07 DIAGNOSIS — R293 Abnormal posture: Secondary | ICD-10-CM

## 2019-03-07 NOTE — Patient Instructions (Signed)
Over Head Pull: Narrow and Wide Grip   Cancer Rehab [phone]   On back, knees bent, feet flat, band across thighs, elbows straight but relaxed. Pull hands apart (start). Keeping elbows straight, bring arms up and over head, hands toward floor. Keep pull steady on band. Hold momentarily. Return slowly, keeping pull steady, back to start. Then do same with a wider grip on the band (past shoulder width) Repeat _5-10__ times. Band color __red____   Side Pull: Double Arm   On back, knees bent, feet flat. Arms perpendicular to body, shoulder level, elbows straight but relaxed. Pull arms out to sides, elbows straight. Resistance band comes across collarbones, hands toward floor. Hold momentarily. Slowly return to starting position. Repeat _5-10__ times. Band color _red____   Sword   On back, knees bent, feet flat, left hand on left hip, right hand above left. Pull right arm DIAGONALLY (hip to shoulder) across chest. Bring right arm along head toward floor. Hold momentarily. Slowly return to starting position. Repeat _5-10__ times. Do with left arm. Band color _red_____   Shoulder Rotation: Double Arm   On back, knees bent, feet flat, elbows tucked at sides, bent 90, hands palms up. Pull hands apart and down toward floor, keeping elbows near sides. Hold momentarily. Slowly return to starting position. Repeat _5-10__ times. Band color __red____  Manual lymph drainage:  Start with circles near neck, above collarbones 10 times each side. Deep Effective Breath   Standing, sitting, or laying down, place both hands on the belly. Take a deep breath IN, expanding the belly; then breath OUT, contracting the belly. Repeat __5__ times. Do __2-3__ sessions per day and before your self massage.  Axilla to Axilla - Sweep   On uninvolved side make 5 circles in the armpit, then pump _5__ times from involved armpit across chest to uninvolved armpit, making a pathway. Do _1__ time per day.  Copyright   VHI. All rights reserved.  Axilla to Inguinal Nodes - Sweep   On involved side, make 5 circles at groin at panty line, then pump _5__ times from armpit along side of trunk to outer hip, making your other pathway. Do __1_ time per day.  Copyright  VHI. All rights reserved.  Arm Posterior: Elbow to Shoulder - Sweep   Pump _5__ times from back of elbow to top of shoulder. Then inner to outer upper arm _5_ times, then outer arm again _5_ times. Then back to the pathways _2-3_ times. Do _1__ time per day.  Copyright  VHI. All rights reserved.  ARM: Volar Wrist to Elbow - Sweep   Pump or stationary circles _5__ times from wrist to elbow making sure to do both sides of the forearm. Then retrace your steps to the outer arm, and the pathways _2-3_ times each. Do _1__ time per day.  Copyright  VHI. All rights reserved.  ARM: Dorsum of Hand to Shoulder - Sweep   Pump or stationary circles _5__ times on back of hand including knuckle spaces and individual fingers if needed working up towards the wrist, then retrace all your steps working back up the forearm, doing both sides; upper outer arm and back to your pathways _2-3_ times each. Then do 5 circles again at uninvolved armpit and involved groin where you started! Good job!! Do __1_ time per day.
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
## 2019-03-08 DIAGNOSIS — I220 Subsequent ST elevation (STEMI) myocardial infarction of anterior wall: Secondary | ICD-10-CM | POA: Diagnosis not present

## 2019-03-09 DIAGNOSIS — I220 Subsequent ST elevation (STEMI) myocardial infarction of anterior wall: Secondary | ICD-10-CM | POA: Diagnosis not present

## 2019-03-10 DIAGNOSIS — I220 Subsequent ST elevation (STEMI) myocardial infarction of anterior wall: Secondary | ICD-10-CM | POA: Diagnosis not present

## 2019-03-11 DIAGNOSIS — I220 Subsequent ST elevation (STEMI) myocardial infarction of anterior wall: Secondary | ICD-10-CM | POA: Diagnosis not present

## 2019-03-12 ENCOUNTER — Telehealth: Payer: Self-pay | Admitting: Family Medicine

## 2019-03-12 DIAGNOSIS — Z1211 Encounter for screening for malignant neoplasm of colon: Secondary | ICD-10-CM

## 2019-03-12 DIAGNOSIS — I220 Subsequent ST elevation (STEMI) myocardial infarction of anterior wall: Secondary | ICD-10-CM | POA: Diagnosis not present

## 2019-03-12 NOTE — Telephone Encounter (Signed)
Patient with history of breast cancer uterine fibroids causing complaining that she is "bumps all over her stomach".  She has been tolerating p.o. intake with no problems, and actually says that she has been eating more than usual.  She has not had any significant weight changes that she knows about although she says that her belly feels bigger to her.  She has not had any bowel changes.  She denies urinary symptoms.  She has not any shortness of breath, fever symptoms, other sick symptoms.  She does say that her known cyst on her hand has been causing her more pain lately but she has been following with Ortho for this and she is suggested to call them to continue that work-up.  We discussed the risk versus reward of coming in for these mild symptomatic complaints and based off her description over the phone suggest that she consider waiting until the corona outbreak had calm down.  She was insistent that she wanted to be seen and per current clinic policy we scheduled her.  Dr. Criss Rosales

## 2019-03-13 ENCOUNTER — Telehealth: Payer: Self-pay | Admitting: Family Medicine

## 2019-03-13 ENCOUNTER — Ambulatory Visit: Payer: Medicaid Other | Admitting: Family Medicine

## 2019-03-13 DIAGNOSIS — I220 Subsequent ST elevation (STEMI) myocardial infarction of anterior wall: Secondary | ICD-10-CM | POA: Diagnosis not present

## 2019-03-14 DIAGNOSIS — I220 Subsequent ST elevation (STEMI) myocardial infarction of anterior wall: Secondary | ICD-10-CM | POA: Diagnosis not present

## 2019-03-14 NOTE — Telephone Encounter (Signed)
Patient called to reschedule her appointment to sometime in late April.  She needs to be seen because she needs an annual physical at that time.  Made this appointment with me on 4/20 but told her that this is tentative since appointment availabilities may change depending on the coronavirus epidemic.

## 2019-03-15 ENCOUNTER — Other Ambulatory Visit: Payer: Self-pay

## 2019-03-15 DIAGNOSIS — E876 Hypokalemia: Secondary | ICD-10-CM

## 2019-03-15 DIAGNOSIS — I220 Subsequent ST elevation (STEMI) myocardial infarction of anterior wall: Secondary | ICD-10-CM | POA: Diagnosis not present

## 2019-03-16 DIAGNOSIS — I220 Subsequent ST elevation (STEMI) myocardial infarction of anterior wall: Secondary | ICD-10-CM | POA: Diagnosis not present

## 2019-03-16 MED ORDER — POTASSIUM CHLORIDE CRYS ER 20 MEQ PO TBCR: EXTENDED_RELEASE_TABLET | ORAL | 1 refills | Status: DC

## 2019-03-16 MED ORDER — ALLOPURINOL 300 MG PO TABS: 300.0000 mg | ORAL_TABLET | Freq: Every day | ORAL | 2 refills | Status: DC

## 2019-03-17 DIAGNOSIS — I220 Subsequent ST elevation (STEMI) myocardial infarction of anterior wall: Secondary | ICD-10-CM | POA: Diagnosis not present

## 2019-03-18 DIAGNOSIS — I220 Subsequent ST elevation (STEMI) myocardial infarction of anterior wall: Secondary | ICD-10-CM | POA: Diagnosis not present

## 2019-03-19 DIAGNOSIS — I220 Subsequent ST elevation (STEMI) myocardial infarction of anterior wall: Secondary | ICD-10-CM | POA: Diagnosis not present

## 2019-03-20 DIAGNOSIS — I220 Subsequent ST elevation (STEMI) myocardial infarction of anterior wall: Secondary | ICD-10-CM | POA: Diagnosis not present

## 2019-03-21 ENCOUNTER — Other Ambulatory Visit: Payer: Self-pay | Admitting: Oncology

## 2019-03-21 DIAGNOSIS — M792 Neuralgia and neuritis, unspecified: Secondary | ICD-10-CM

## 2019-03-21 DIAGNOSIS — I220 Subsequent ST elevation (STEMI) myocardial infarction of anterior wall: Secondary | ICD-10-CM | POA: Diagnosis not present

## 2019-03-21 DIAGNOSIS — I10 Essential (primary) hypertension: Secondary | ICD-10-CM

## 2019-03-21 DIAGNOSIS — G8929 Other chronic pain: Secondary | ICD-10-CM

## 2019-03-21 DIAGNOSIS — Z853 Personal history of malignant neoplasm of breast: Secondary | ICD-10-CM

## 2019-03-21 MED ORDER — METHADONE HCL 5 MG PO TABS: 15.0000 mg | ORAL_TABLET | Freq: Three times a day (TID) | ORAL | 0 refills | Status: DC | PRN

## 2019-03-22 DIAGNOSIS — I220 Subsequent ST elevation (STEMI) myocardial infarction of anterior wall: Secondary | ICD-10-CM | POA: Diagnosis not present

## 2019-03-22 MED FILL — METHADONE HCL 5 MG TABLET: 5 | 30 days supply | Qty: 270 | Fill #0

## 2019-03-23 ENCOUNTER — Other Ambulatory Visit: Payer: Self-pay | Admitting: Family Medicine

## 2019-03-23 DIAGNOSIS — Z853 Personal history of malignant neoplasm of breast: Secondary | ICD-10-CM

## 2019-03-23 DIAGNOSIS — G8929 Other chronic pain: Secondary | ICD-10-CM

## 2019-03-23 DIAGNOSIS — I220 Subsequent ST elevation (STEMI) myocardial infarction of anterior wall: Secondary | ICD-10-CM | POA: Diagnosis not present

## 2019-03-23 DIAGNOSIS — M792 Neuralgia and neuritis, unspecified: Secondary | ICD-10-CM

## 2019-03-23 DIAGNOSIS — I10 Essential (primary) hypertension: Secondary | ICD-10-CM

## 2019-03-24 DIAGNOSIS — I220 Subsequent ST elevation (STEMI) myocardial infarction of anterior wall: Secondary | ICD-10-CM | POA: Diagnosis not present

## 2019-03-25 DIAGNOSIS — I220 Subsequent ST elevation (STEMI) myocardial infarction of anterior wall: Secondary | ICD-10-CM | POA: Diagnosis not present

## 2019-03-26 DIAGNOSIS — I220 Subsequent ST elevation (STEMI) myocardial infarction of anterior wall: Secondary | ICD-10-CM | POA: Diagnosis not present

## 2019-03-27 DIAGNOSIS — I220 Subsequent ST elevation (STEMI) myocardial infarction of anterior wall: Secondary | ICD-10-CM | POA: Diagnosis not present

## 2019-03-28 DIAGNOSIS — I220 Subsequent ST elevation (STEMI) myocardial infarction of anterior wall: Secondary | ICD-10-CM | POA: Diagnosis not present

## 2019-03-29 DIAGNOSIS — I220 Subsequent ST elevation (STEMI) myocardial infarction of anterior wall: Secondary | ICD-10-CM | POA: Diagnosis not present

## 2019-03-30 DIAGNOSIS — I220 Subsequent ST elevation (STEMI) myocardial infarction of anterior wall: Secondary | ICD-10-CM | POA: Diagnosis not present

## 2019-03-31 DIAGNOSIS — I220 Subsequent ST elevation (STEMI) myocardial infarction of anterior wall: Secondary | ICD-10-CM | POA: Diagnosis not present

## 2019-04-01 DIAGNOSIS — I220 Subsequent ST elevation (STEMI) myocardial infarction of anterior wall: Secondary | ICD-10-CM | POA: Diagnosis not present

## 2019-04-02 DIAGNOSIS — I220 Subsequent ST elevation (STEMI) myocardial infarction of anterior wall: Secondary | ICD-10-CM | POA: Diagnosis not present

## 2019-04-03 DIAGNOSIS — I220 Subsequent ST elevation (STEMI) myocardial infarction of anterior wall: Secondary | ICD-10-CM | POA: Diagnosis not present

## 2019-04-04 DIAGNOSIS — I220 Subsequent ST elevation (STEMI) myocardial infarction of anterior wall: Secondary | ICD-10-CM | POA: Diagnosis not present

## 2019-04-05 DIAGNOSIS — I220 Subsequent ST elevation (STEMI) myocardial infarction of anterior wall: Secondary | ICD-10-CM | POA: Diagnosis not present

## 2019-04-06 DIAGNOSIS — I220 Subsequent ST elevation (STEMI) myocardial infarction of anterior wall: Secondary | ICD-10-CM | POA: Diagnosis not present

## 2019-04-07 DIAGNOSIS — I220 Subsequent ST elevation (STEMI) myocardial infarction of anterior wall: Secondary | ICD-10-CM | POA: Diagnosis not present

## 2019-04-08 DIAGNOSIS — I220 Subsequent ST elevation (STEMI) myocardial infarction of anterior wall: Secondary | ICD-10-CM | POA: Diagnosis not present

## 2019-04-09 ENCOUNTER — Ambulatory Visit: Payer: Medicaid Other | Admitting: Family Medicine

## 2019-04-09 DIAGNOSIS — I220 Subsequent ST elevation (STEMI) myocardial infarction of anterior wall: Secondary | ICD-10-CM | POA: Diagnosis not present

## 2019-04-10 DIAGNOSIS — I220 Subsequent ST elevation (STEMI) myocardial infarction of anterior wall: Secondary | ICD-10-CM | POA: Diagnosis not present

## 2019-04-11 DIAGNOSIS — I220 Subsequent ST elevation (STEMI) myocardial infarction of anterior wall: Secondary | ICD-10-CM | POA: Diagnosis not present

## 2019-04-12 DIAGNOSIS — I220 Subsequent ST elevation (STEMI) myocardial infarction of anterior wall: Secondary | ICD-10-CM | POA: Diagnosis not present

## 2019-04-13 DIAGNOSIS — I220 Subsequent ST elevation (STEMI) myocardial infarction of anterior wall: Secondary | ICD-10-CM | POA: Diagnosis not present

## 2019-04-14 DIAGNOSIS — I220 Subsequent ST elevation (STEMI) myocardial infarction of anterior wall: Secondary | ICD-10-CM | POA: Diagnosis not present

## 2019-04-15 DIAGNOSIS — I220 Subsequent ST elevation (STEMI) myocardial infarction of anterior wall: Secondary | ICD-10-CM | POA: Diagnosis not present

## 2019-04-16 DIAGNOSIS — I220 Subsequent ST elevation (STEMI) myocardial infarction of anterior wall: Secondary | ICD-10-CM | POA: Diagnosis not present

## 2019-04-17 ENCOUNTER — Ambulatory Visit: Payer: Medicaid Other

## 2019-04-17 DIAGNOSIS — I220 Subsequent ST elevation (STEMI) myocardial infarction of anterior wall: Secondary | ICD-10-CM | POA: Diagnosis not present

## 2019-04-18 DIAGNOSIS — I220 Subsequent ST elevation (STEMI) myocardial infarction of anterior wall: Secondary | ICD-10-CM | POA: Diagnosis not present

## 2019-04-19 ENCOUNTER — Other Ambulatory Visit: Payer: Self-pay | Admitting: Oncology

## 2019-04-19 ENCOUNTER — Telehealth: Payer: Self-pay | Admitting: Oncology

## 2019-04-19 DIAGNOSIS — M792 Neuralgia and neuritis, unspecified: Secondary | ICD-10-CM

## 2019-04-19 DIAGNOSIS — I10 Essential (primary) hypertension: Secondary | ICD-10-CM

## 2019-04-19 DIAGNOSIS — I220 Subsequent ST elevation (STEMI) myocardial infarction of anterior wall: Secondary | ICD-10-CM | POA: Diagnosis not present

## 2019-04-19 DIAGNOSIS — Z853 Personal history of malignant neoplasm of breast: Secondary | ICD-10-CM

## 2019-04-19 DIAGNOSIS — G8929 Other chronic pain: Secondary | ICD-10-CM

## 2019-04-19 MED ORDER — METHADONE HCL 5 MG PO TABS: 15.0000 mg | ORAL_TABLET | Freq: Three times a day (TID) | ORAL | 0 refills | Status: DC | PRN

## 2019-04-19 MED FILL — METHADONE HCL 5 MG TABLET: 5 | 30 days supply | Qty: 270 | Fill #0

## 2019-04-19 NOTE — Telephone Encounter (Signed)
Left vm for patient re: appt on 5/420 to let them know it has been changed to [facility].  Emailed them with step-by-step instructions on how to log on.

## 2019-04-20 DIAGNOSIS — I220 Subsequent ST elevation (STEMI) myocardial infarction of anterior wall: Secondary | ICD-10-CM | POA: Diagnosis not present

## 2019-04-20 NOTE — Progress Notes (Signed)
Patient had a WebEx meeting with me today but she was not feeling well.  She wants to be seen in person.  She specifically requested 05/10/2019.  I will be able to see her that day even though it is a call day.  She knows to call for any other issues that may develop before then.

## 2019-04-21 DIAGNOSIS — I220 Subsequent ST elevation (STEMI) myocardial infarction of anterior wall: Secondary | ICD-10-CM | POA: Diagnosis not present

## 2019-04-22 DIAGNOSIS — I220 Subsequent ST elevation (STEMI) myocardial infarction of anterior wall: Secondary | ICD-10-CM | POA: Diagnosis not present

## 2019-04-23 ENCOUNTER — Other Ambulatory Visit: Payer: Medicaid Other

## 2019-04-23 ENCOUNTER — Inpatient Hospital Stay: Payer: Medicaid Other | Attending: Adult Health | Admitting: Oncology

## 2019-04-23 DIAGNOSIS — E785 Hyperlipidemia, unspecified: Secondary | ICD-10-CM

## 2019-04-23 DIAGNOSIS — I700 Atherosclerosis of aorta: Secondary | ICD-10-CM

## 2019-04-23 DIAGNOSIS — C50412 Malignant neoplasm of upper-outer quadrant of left female breast: Secondary | ICD-10-CM

## 2019-04-23 DIAGNOSIS — C50811 Malignant neoplasm of overlapping sites of right female breast: Secondary | ICD-10-CM

## 2019-04-23 DIAGNOSIS — I220 Subsequent ST elevation (STEMI) myocardial infarction of anterior wall: Secondary | ICD-10-CM | POA: Diagnosis not present

## 2019-04-24 ENCOUNTER — Telehealth: Payer: Self-pay | Admitting: Oncology

## 2019-04-24 DIAGNOSIS — I220 Subsequent ST elevation (STEMI) myocardial infarction of anterior wall: Secondary | ICD-10-CM | POA: Diagnosis not present

## 2019-04-24 NOTE — Telephone Encounter (Signed)
Called patient with schedule and she said to cancel because she had already scheduled with the nurse.

## 2019-04-25 DIAGNOSIS — I220 Subsequent ST elevation (STEMI) myocardial infarction of anterior wall: Secondary | ICD-10-CM | POA: Diagnosis not present

## 2019-04-26 DIAGNOSIS — I220 Subsequent ST elevation (STEMI) myocardial infarction of anterior wall: Secondary | ICD-10-CM | POA: Diagnosis not present

## 2019-04-27 DIAGNOSIS — I220 Subsequent ST elevation (STEMI) myocardial infarction of anterior wall: Secondary | ICD-10-CM | POA: Diagnosis not present

## 2019-04-28 DIAGNOSIS — I220 Subsequent ST elevation (STEMI) myocardial infarction of anterior wall: Secondary | ICD-10-CM | POA: Diagnosis not present

## 2019-04-29 DIAGNOSIS — I220 Subsequent ST elevation (STEMI) myocardial infarction of anterior wall: Secondary | ICD-10-CM | POA: Diagnosis not present

## 2019-04-30 ENCOUNTER — Other Ambulatory Visit: Payer: Medicaid Other

## 2019-04-30 ENCOUNTER — Ambulatory Visit: Payer: Medicaid Other

## 2019-04-30 ENCOUNTER — Ambulatory Visit: Payer: Medicaid Other | Admitting: Family Medicine

## 2019-04-30 ENCOUNTER — Ambulatory Visit: Payer: Medicaid Other | Admitting: Oncology

## 2019-04-30 DIAGNOSIS — N938 Other specified abnormal uterine and vaginal bleeding: Secondary | ICD-10-CM | POA: Insufficient documentation

## 2019-04-30 DIAGNOSIS — I220 Subsequent ST elevation (STEMI) myocardial infarction of anterior wall: Secondary | ICD-10-CM | POA: Diagnosis not present

## 2019-04-30 NOTE — Progress Notes (Deleted)
  Subjective:   Patient ID: [name]    DOB: November 29, 1961, 58 y.o. female   MRN: [id number]  [name] is a 58 y.o. female with a history of HTN, CAD, OA, DUB, HLD, tobacco use, h/o breast ca s/p mastectomy here for well woman/preventative visit and annual GYN examination  Acute Concerns: *** - Previously with DUB 2/2 simple hyperplasia and uterine fibroids, scheduled for RATH with BSO  - h/o malignant L breast cancer s/p bilateral mastectomy, on tamoxifen.  Diet: ***  Exercise: ***  Sexual/Birth History: G0P0, *** currently sexually active with *** partner  Birth Control: post-menopausal  POA/Living Will: ***  Review of Systems: Per HPI.    PMH, PSH, Medications, Allergies, and FmHx reviewed and updated in EMR.  Social History:  - *** smoker - ID use: cocaine?***  Immunization:  Tdap/TD: UTD  Influenza: n/a  Pneumococcal: n/a  Herpes Zoster: ***  Cancer Screening:  Pap Smear: UTD, next due 2021  Mammogram: ***  Colonoscopy: due  Dexa: ***  Review of Systems:  Per HPI.  [place], medications and smoking status reviewed.  Objective:   LMP 08/07/2011  Vitals and nursing note reviewed.  General: well nourished, well developed, in no acute distress with non-toxic appearance HEENT: normocephalic, atraumatic, moist mucous membranes Neck: supple, non-tender without lymphadenopathy CV: regular rate and rhythm without murmurs, rubs, or gallops, no lower extremity edema Lungs: clear to auscultation bilaterally with normal work of breathing Abdomen: soft, non-tender, non-distended, no masses or organomegaly palpable, normoactive bowel sounds Skin: warm, dry, no rashes or lesions Extremities: warm and well perfused, normal tone MSK: ROM grossly intact, strength intact, gait normal Neuro: Alert and oriented, speech normal  Assessment & Plan:   No problem-specific Assessment & Plan notes found for this encounter.  No orders of the defined types were placed in  this encounter.  No orders of the defined types were placed in this encounter.   Rory Percy, DO PGY-2, [facility] 04/30/2019 8:11 AM

## 2019-05-01 DIAGNOSIS — I220 Subsequent ST elevation (STEMI) myocardial infarction of anterior wall: Secondary | ICD-10-CM | POA: Diagnosis not present

## 2019-05-02 DIAGNOSIS — I220 Subsequent ST elevation (STEMI) myocardial infarction of anterior wall: Secondary | ICD-10-CM | POA: Diagnosis not present

## 2019-05-03 DIAGNOSIS — I220 Subsequent ST elevation (STEMI) myocardial infarction of anterior wall: Secondary | ICD-10-CM | POA: Diagnosis not present

## 2019-05-04 DIAGNOSIS — I220 Subsequent ST elevation (STEMI) myocardial infarction of anterior wall: Secondary | ICD-10-CM | POA: Diagnosis not present

## 2019-05-05 DIAGNOSIS — I220 Subsequent ST elevation (STEMI) myocardial infarction of anterior wall: Secondary | ICD-10-CM | POA: Diagnosis not present

## 2019-05-06 DIAGNOSIS — I220 Subsequent ST elevation (STEMI) myocardial infarction of anterior wall: Secondary | ICD-10-CM | POA: Diagnosis not present

## 2019-05-07 DIAGNOSIS — I220 Subsequent ST elevation (STEMI) myocardial infarction of anterior wall: Secondary | ICD-10-CM | POA: Diagnosis not present

## 2019-05-08 DIAGNOSIS — I220 Subsequent ST elevation (STEMI) myocardial infarction of anterior wall: Secondary | ICD-10-CM | POA: Diagnosis not present

## 2019-05-09 DIAGNOSIS — I220 Subsequent ST elevation (STEMI) myocardial infarction of anterior wall: Secondary | ICD-10-CM | POA: Diagnosis not present

## 2019-05-10 ENCOUNTER — Telehealth: Payer: Self-pay

## 2019-05-10 ENCOUNTER — Inpatient Hospital Stay: Payer: Medicaid Other | Admitting: Oncology

## 2019-05-10 DIAGNOSIS — I220 Subsequent ST elevation (STEMI) myocardial infarction of anterior wall: Secondary | ICD-10-CM | POA: Diagnosis not present

## 2019-05-10 NOTE — Telephone Encounter (Signed)
Patient wanted to cancel today's MD follow up due to a "sinus headache". She declined any concerns "just don't want to leave the house today". Appt cancelled by me and told patient she would here from scheduling the next day or two, I sent them a message already.

## 2019-05-11 ENCOUNTER — Encounter: Payer: Medicaid Other | Admitting: Family Medicine

## 2019-05-11 DIAGNOSIS — I220 Subsequent ST elevation (STEMI) myocardial infarction of anterior wall: Secondary | ICD-10-CM | POA: Diagnosis not present

## 2019-05-12 DIAGNOSIS — I220 Subsequent ST elevation (STEMI) myocardial infarction of anterior wall: Secondary | ICD-10-CM | POA: Diagnosis not present

## 2019-05-13 DIAGNOSIS — I220 Subsequent ST elevation (STEMI) myocardial infarction of anterior wall: Secondary | ICD-10-CM | POA: Diagnosis not present

## 2019-05-14 DIAGNOSIS — I220 Subsequent ST elevation (STEMI) myocardial infarction of anterior wall: Secondary | ICD-10-CM | POA: Diagnosis not present

## 2019-05-15 ENCOUNTER — Encounter: Payer: Medicaid Other | Admitting: Family Medicine

## 2019-05-15 DIAGNOSIS — I220 Subsequent ST elevation (STEMI) myocardial infarction of anterior wall: Secondary | ICD-10-CM | POA: Diagnosis not present

## 2019-05-16 ENCOUNTER — Inpatient Hospital Stay: Payer: Medicaid Other | Admitting: Oncology

## 2019-05-16 DIAGNOSIS — I220 Subsequent ST elevation (STEMI) myocardial infarction of anterior wall: Secondary | ICD-10-CM | POA: Diagnosis not present

## 2019-05-17 ENCOUNTER — Other Ambulatory Visit: Payer: Self-pay | Admitting: *Deleted

## 2019-05-17 ENCOUNTER — Other Ambulatory Visit: Payer: Self-pay | Admitting: Oncology

## 2019-05-17 DIAGNOSIS — I220 Subsequent ST elevation (STEMI) myocardial infarction of anterior wall: Secondary | ICD-10-CM | POA: Diagnosis not present

## 2019-05-17 DIAGNOSIS — I10 Essential (primary) hypertension: Secondary | ICD-10-CM

## 2019-05-17 DIAGNOSIS — Z853 Personal history of malignant neoplasm of breast: Secondary | ICD-10-CM

## 2019-05-17 DIAGNOSIS — G8929 Other chronic pain: Secondary | ICD-10-CM

## 2019-05-17 DIAGNOSIS — M792 Neuralgia and neuritis, unspecified: Secondary | ICD-10-CM

## 2019-05-17 MED ORDER — METHADONE HCL 5 MG PO TABS: 15.0000 mg | ORAL_TABLET | Freq: Three times a day (TID) | ORAL | 0 refills | Status: DC | PRN

## 2019-05-17 MED FILL — METHADONE HCL 5 MG TABLET: 5 | 30 days supply | Qty: 270 | Fill #0

## 2019-05-18 ENCOUNTER — Encounter: Payer: Medicaid Other | Admitting: Family Medicine

## 2019-05-18 DIAGNOSIS — I220 Subsequent ST elevation (STEMI) myocardial infarction of anterior wall: Secondary | ICD-10-CM | POA: Diagnosis not present

## 2019-05-19 DIAGNOSIS — I220 Subsequent ST elevation (STEMI) myocardial infarction of anterior wall: Secondary | ICD-10-CM | POA: Diagnosis not present

## 2019-05-20 DIAGNOSIS — I220 Subsequent ST elevation (STEMI) myocardial infarction of anterior wall: Secondary | ICD-10-CM | POA: Diagnosis not present

## 2019-05-21 DIAGNOSIS — I220 Subsequent ST elevation (STEMI) myocardial infarction of anterior wall: Secondary | ICD-10-CM | POA: Diagnosis not present

## 2019-05-22 ENCOUNTER — Encounter: Payer: Medicaid Other | Admitting: Family Medicine

## 2019-05-22 DIAGNOSIS — I220 Subsequent ST elevation (STEMI) myocardial infarction of anterior wall: Secondary | ICD-10-CM | POA: Diagnosis not present

## 2019-05-23 DIAGNOSIS — I220 Subsequent ST elevation (STEMI) myocardial infarction of anterior wall: Secondary | ICD-10-CM | POA: Diagnosis not present

## 2019-05-24 DIAGNOSIS — I220 Subsequent ST elevation (STEMI) myocardial infarction of anterior wall: Secondary | ICD-10-CM | POA: Diagnosis not present

## 2019-05-25 DIAGNOSIS — I220 Subsequent ST elevation (STEMI) myocardial infarction of anterior wall: Secondary | ICD-10-CM | POA: Diagnosis not present

## 2019-05-26 DIAGNOSIS — I220 Subsequent ST elevation (STEMI) myocardial infarction of anterior wall: Secondary | ICD-10-CM | POA: Diagnosis not present

## 2019-05-27 DIAGNOSIS — I220 Subsequent ST elevation (STEMI) myocardial infarction of anterior wall: Secondary | ICD-10-CM | POA: Diagnosis not present

## 2019-05-28 DIAGNOSIS — I220 Subsequent ST elevation (STEMI) myocardial infarction of anterior wall: Secondary | ICD-10-CM | POA: Diagnosis not present

## 2019-05-29 DIAGNOSIS — I220 Subsequent ST elevation (STEMI) myocardial infarction of anterior wall: Secondary | ICD-10-CM | POA: Diagnosis not present

## 2019-05-30 DIAGNOSIS — I220 Subsequent ST elevation (STEMI) myocardial infarction of anterior wall: Secondary | ICD-10-CM | POA: Diagnosis not present

## 2019-05-31 DIAGNOSIS — I220 Subsequent ST elevation (STEMI) myocardial infarction of anterior wall: Secondary | ICD-10-CM | POA: Diagnosis not present

## 2019-06-01 DIAGNOSIS — I220 Subsequent ST elevation (STEMI) myocardial infarction of anterior wall: Secondary | ICD-10-CM | POA: Diagnosis not present

## 2019-06-02 DIAGNOSIS — I220 Subsequent ST elevation (STEMI) myocardial infarction of anterior wall: Secondary | ICD-10-CM | POA: Diagnosis not present

## 2019-06-03 DIAGNOSIS — I220 Subsequent ST elevation (STEMI) myocardial infarction of anterior wall: Secondary | ICD-10-CM | POA: Diagnosis not present

## 2019-06-04 ENCOUNTER — Other Ambulatory Visit: Payer: Self-pay | Admitting: Family Medicine

## 2019-06-04 DIAGNOSIS — I220 Subsequent ST elevation (STEMI) myocardial infarction of anterior wall: Secondary | ICD-10-CM | POA: Diagnosis not present

## 2019-06-04 DIAGNOSIS — E876 Hypokalemia: Secondary | ICD-10-CM

## 2019-06-05 ENCOUNTER — Encounter: Payer: Medicaid Other | Admitting: Family Medicine

## 2019-06-05 DIAGNOSIS — I220 Subsequent ST elevation (STEMI) myocardial infarction of anterior wall: Secondary | ICD-10-CM | POA: Diagnosis not present

## 2019-06-06 DIAGNOSIS — I220 Subsequent ST elevation (STEMI) myocardial infarction of anterior wall: Secondary | ICD-10-CM | POA: Diagnosis not present

## 2019-06-07 DIAGNOSIS — I220 Subsequent ST elevation (STEMI) myocardial infarction of anterior wall: Secondary | ICD-10-CM | POA: Diagnosis not present

## 2019-06-08 DIAGNOSIS — I220 Subsequent ST elevation (STEMI) myocardial infarction of anterior wall: Secondary | ICD-10-CM | POA: Diagnosis not present

## 2019-06-09 DIAGNOSIS — I220 Subsequent ST elevation (STEMI) myocardial infarction of anterior wall: Secondary | ICD-10-CM | POA: Diagnosis not present

## 2019-06-10 DIAGNOSIS — I220 Subsequent ST elevation (STEMI) myocardial infarction of anterior wall: Secondary | ICD-10-CM | POA: Diagnosis not present

## 2019-06-11 ENCOUNTER — Other Ambulatory Visit: Payer: Self-pay

## 2019-06-11 DIAGNOSIS — I220 Subsequent ST elevation (STEMI) myocardial infarction of anterior wall: Secondary | ICD-10-CM | POA: Diagnosis not present

## 2019-06-11 MED ORDER — METOPROLOL SUCCINATE ER 50 MG PO TB24: 100.0000 mg | ORAL_TABLET | Freq: Every day | ORAL | 2 refills | Status: DC

## 2019-06-12 ENCOUNTER — Other Ambulatory Visit: Payer: Self-pay | Admitting: *Deleted

## 2019-06-12 ENCOUNTER — Other Ambulatory Visit: Payer: Self-pay | Admitting: Oncology

## 2019-06-12 DIAGNOSIS — G8929 Other chronic pain: Secondary | ICD-10-CM

## 2019-06-12 DIAGNOSIS — Z853 Personal history of malignant neoplasm of breast: Secondary | ICD-10-CM

## 2019-06-12 DIAGNOSIS — M792 Neuralgia and neuritis, unspecified: Secondary | ICD-10-CM

## 2019-06-12 DIAGNOSIS — I220 Subsequent ST elevation (STEMI) myocardial infarction of anterior wall: Secondary | ICD-10-CM | POA: Diagnosis not present

## 2019-06-12 DIAGNOSIS — I10 Essential (primary) hypertension: Secondary | ICD-10-CM

## 2019-06-12 MED ORDER — METHADONE HCL 5 MG PO TABS: 15.0000 mg | ORAL_TABLET | Freq: Three times a day (TID) | ORAL | 0 refills | Status: DC | PRN

## 2019-06-13 DIAGNOSIS — I220 Subsequent ST elevation (STEMI) myocardial infarction of anterior wall: Secondary | ICD-10-CM | POA: Diagnosis not present

## 2019-06-14 DIAGNOSIS — I220 Subsequent ST elevation (STEMI) myocardial infarction of anterior wall: Secondary | ICD-10-CM | POA: Diagnosis not present

## 2019-06-15 DIAGNOSIS — I220 Subsequent ST elevation (STEMI) myocardial infarction of anterior wall: Secondary | ICD-10-CM | POA: Diagnosis not present

## 2019-06-15 MED FILL — METHADONE HCL 5 MG TABLET: 5 | 30 days supply | Qty: 270 | Fill #0

## 2019-06-16 ENCOUNTER — Other Ambulatory Visit: Payer: Self-pay | Admitting: Family Medicine

## 2019-06-16 DIAGNOSIS — I220 Subsequent ST elevation (STEMI) myocardial infarction of anterior wall: Secondary | ICD-10-CM | POA: Diagnosis not present

## 2019-06-17 DIAGNOSIS — I220 Subsequent ST elevation (STEMI) myocardial infarction of anterior wall: Secondary | ICD-10-CM | POA: Diagnosis not present

## 2019-06-18 DIAGNOSIS — I220 Subsequent ST elevation (STEMI) myocardial infarction of anterior wall: Secondary | ICD-10-CM | POA: Diagnosis not present

## 2019-06-19 ENCOUNTER — Encounter: Payer: Medicaid Other | Admitting: Family Medicine

## 2019-06-19 ENCOUNTER — Other Ambulatory Visit: Payer: Self-pay | Admitting: Family Medicine

## 2019-06-19 DIAGNOSIS — Z853 Personal history of malignant neoplasm of breast: Secondary | ICD-10-CM

## 2019-06-19 DIAGNOSIS — G8929 Other chronic pain: Secondary | ICD-10-CM

## 2019-06-19 DIAGNOSIS — I220 Subsequent ST elevation (STEMI) myocardial infarction of anterior wall: Secondary | ICD-10-CM | POA: Diagnosis not present

## 2019-06-19 DIAGNOSIS — I10 Essential (primary) hypertension: Secondary | ICD-10-CM

## 2019-06-19 DIAGNOSIS — M792 Neuralgia and neuritis, unspecified: Secondary | ICD-10-CM

## 2019-06-20 DIAGNOSIS — I220 Subsequent ST elevation (STEMI) myocardial infarction of anterior wall: Secondary | ICD-10-CM | POA: Diagnosis not present

## 2019-06-20 NOTE — Progress Notes (Signed)
No show

## 2019-06-21 ENCOUNTER — Telehealth: Payer: Self-pay | Admitting: Oncology

## 2019-06-21 ENCOUNTER — Inpatient Hospital Stay: Payer: Medicaid Other | Attending: Adult Health | Admitting: Oncology

## 2019-06-21 ENCOUNTER — Encounter: Payer: Self-pay | Admitting: Oncology

## 2019-06-21 ENCOUNTER — Telehealth (INDEPENDENT_AMBULATORY_CARE_PROVIDER_SITE_OTHER): Payer: Medicaid Other | Admitting: Family Medicine

## 2019-06-21 ENCOUNTER — Other Ambulatory Visit: Payer: Self-pay

## 2019-06-21 DIAGNOSIS — R600 Localized edema: Secondary | ICD-10-CM | POA: Diagnosis not present

## 2019-06-21 DIAGNOSIS — C50811 Malignant neoplasm of overlapping sites of right female breast: Secondary | ICD-10-CM

## 2019-06-21 DIAGNOSIS — C50412 Malignant neoplasm of upper-outer quadrant of left female breast: Secondary | ICD-10-CM

## 2019-06-21 DIAGNOSIS — I220 Subsequent ST elevation (STEMI) myocardial infarction of anterior wall: Secondary | ICD-10-CM | POA: Diagnosis not present

## 2019-06-21 MED ORDER — VITAMIN D (ERGOCALCIFEROL) 1.25 MG (50000 UNIT) PO CAPS: 50000.0000 [IU] | ORAL_CAPSULE | ORAL | 0 refills | Status: DC

## 2019-06-21 NOTE — Assessment & Plan Note (Addendum)
Patient with intermittent BLLE for 1 month. Patient not currently on any diuretics, and reports she has been told she has CHF. Last Echo in 01/2013 showed: EF 60-65%, G1DD. -Patient would not like any new medications before being seen in the office and reports she is due for her physical. Will not start lasix at this time.  -Scheduled her for follow up with PCP on 7/72020.  -In the meantime, she is to reduce her sodium intake, elevate her legs, and wear compression stockings.  -She is to call/ go to the ED if she develops any SOB or worsening edema.

## 2019-06-21 NOTE — Progress Notes (Signed)
[facility] Telemedicine Visit  Patient consented to have virtual visit. Method of visit: Telephone  Encounter participants: Patient: [name] - located at home Provider: [place] Elle Vezina - located at home Others (if applicable): n/a  Chief Complaint: patient reports ankles swelling  HPI: Patient reports her ankles have been swelling for about 1 month. They do not swell everyday. Reports that it is worse during the day, but she notices it is worse during the day when she is sitting in a chair.  Patient reports hx of CHF and she is on imdur, metoprolol and norvasc. Reports she was on HCTZ for fluid but had to stop due to low potassium. Patient does not wear any compression stockings. Denies any redness in her legs. Denies any fever or chills. Does not endorse orthopnea, uses 2 pillows nightly which is her norm. Denies any chest pain, SOB. Swelling makes it hard for her to get up and do things during the day, but it is not everyday.  She would like to be seen in the office before starting any new medications and is due for her physical. She denies any contacts to covid19, fevers, cough.   ROS: per HPI  Pertinent PMHx: HTN, CAD  Exam:  Respiratory: able to talk in complete sentences comfortably  Assessment/Plan:  Bilateral leg edema Patient with intermittent BLLE for 1 month. Patient not currently on any diuretics, and reports she has been told she has CHF. Last Echo in 01/2013 showed: EF 60-65%, G1DD. -Patient would not like any new medications before being seen in the office and reports she is due for her physical. Will not start lasix at this time.  -Scheduled her for follow up with PCP on 7/72020.  -In the meantime, she is to reduce her sodium intake, elevate her legs, and wear compression stockings.  -She is to call/ go to the ED if she develops any SOB or worsening edema.     Time spent during visit with patient: 11 minutes

## 2019-06-21 NOTE — Telephone Encounter (Signed)
Called pt per 7/2 sch message - unable to reach pt . Left message for pt to call back to reschedule

## 2019-06-22 DIAGNOSIS — I220 Subsequent ST elevation (STEMI) myocardial infarction of anterior wall: Secondary | ICD-10-CM | POA: Diagnosis not present

## 2019-06-23 DIAGNOSIS — I220 Subsequent ST elevation (STEMI) myocardial infarction of anterior wall: Secondary | ICD-10-CM | POA: Diagnosis not present

## 2019-06-24 DIAGNOSIS — I220 Subsequent ST elevation (STEMI) myocardial infarction of anterior wall: Secondary | ICD-10-CM | POA: Diagnosis not present

## 2019-06-25 DIAGNOSIS — I220 Subsequent ST elevation (STEMI) myocardial infarction of anterior wall: Secondary | ICD-10-CM | POA: Diagnosis not present

## 2019-06-26 ENCOUNTER — Ambulatory Visit: Payer: Medicaid Other | Admitting: Family Medicine

## 2019-06-26 DIAGNOSIS — I220 Subsequent ST elevation (STEMI) myocardial infarction of anterior wall: Secondary | ICD-10-CM | POA: Diagnosis not present

## 2019-06-27 DIAGNOSIS — I220 Subsequent ST elevation (STEMI) myocardial infarction of anterior wall: Secondary | ICD-10-CM | POA: Diagnosis not present

## 2019-06-28 DIAGNOSIS — I220 Subsequent ST elevation (STEMI) myocardial infarction of anterior wall: Secondary | ICD-10-CM | POA: Diagnosis not present

## 2019-06-29 DIAGNOSIS — I220 Subsequent ST elevation (STEMI) myocardial infarction of anterior wall: Secondary | ICD-10-CM | POA: Diagnosis not present

## 2019-06-30 DIAGNOSIS — I220 Subsequent ST elevation (STEMI) myocardial infarction of anterior wall: Secondary | ICD-10-CM | POA: Diagnosis not present

## 2019-07-01 DIAGNOSIS — I220 Subsequent ST elevation (STEMI) myocardial infarction of anterior wall: Secondary | ICD-10-CM | POA: Diagnosis not present

## 2019-07-02 DIAGNOSIS — I220 Subsequent ST elevation (STEMI) myocardial infarction of anterior wall: Secondary | ICD-10-CM | POA: Diagnosis not present

## 2019-07-03 ENCOUNTER — Ambulatory Visit: Payer: Medicaid Other | Admitting: Family Medicine

## 2019-07-03 DIAGNOSIS — I220 Subsequent ST elevation (STEMI) myocardial infarction of anterior wall: Secondary | ICD-10-CM | POA: Diagnosis not present

## 2019-07-04 DIAGNOSIS — I220 Subsequent ST elevation (STEMI) myocardial infarction of anterior wall: Secondary | ICD-10-CM | POA: Diagnosis not present

## 2019-07-05 DIAGNOSIS — I220 Subsequent ST elevation (STEMI) myocardial infarction of anterior wall: Secondary | ICD-10-CM | POA: Diagnosis not present

## 2019-07-06 DIAGNOSIS — I220 Subsequent ST elevation (STEMI) myocardial infarction of anterior wall: Secondary | ICD-10-CM | POA: Diagnosis not present

## 2019-07-07 DIAGNOSIS — Z20828 Contact with and (suspected) exposure to other viral communicable diseases: Secondary | ICD-10-CM | POA: Insufficient documentation

## 2019-07-07 DIAGNOSIS — I10 Essential (primary) hypertension: Secondary | ICD-10-CM | POA: Insufficient documentation

## 2019-07-07 DIAGNOSIS — I2510 Atherosclerotic heart disease of native coronary artery without angina pectoris: Secondary | ICD-10-CM | POA: Diagnosis not present

## 2019-07-07 DIAGNOSIS — E876 Hypokalemia: Secondary | ICD-10-CM | POA: Diagnosis not present

## 2019-07-07 DIAGNOSIS — R0789 Other chest pain: Secondary | ICD-10-CM | POA: Diagnosis present

## 2019-07-07 DIAGNOSIS — F17210 Nicotine dependence, cigarettes, uncomplicated: Secondary | ICD-10-CM | POA: Insufficient documentation

## 2019-07-07 DIAGNOSIS — Z7982 Long term (current) use of aspirin: Secondary | ICD-10-CM | POA: Insufficient documentation

## 2019-07-07 DIAGNOSIS — I220 Subsequent ST elevation (STEMI) myocardial infarction of anterior wall: Secondary | ICD-10-CM | POA: Diagnosis not present

## 2019-07-07 DIAGNOSIS — Z79899 Other long term (current) drug therapy: Secondary | ICD-10-CM | POA: Insufficient documentation

## 2019-07-07 DIAGNOSIS — J111 Influenza due to unidentified influenza virus with other respiratory manifestations: Secondary | ICD-10-CM | POA: Diagnosis not present

## 2019-07-07 DIAGNOSIS — R079 Chest pain, unspecified: Secondary | ICD-10-CM | POA: Diagnosis not present

## 2019-07-07 DIAGNOSIS — Z853 Personal history of malignant neoplasm of breast: Secondary | ICD-10-CM | POA: Insufficient documentation

## 2019-07-08 ENCOUNTER — Emergency Department (HOSPITAL_COMMUNITY): Payer: Medicaid Other

## 2019-07-08 ENCOUNTER — Other Ambulatory Visit: Payer: Self-pay

## 2019-07-08 ENCOUNTER — Emergency Department (HOSPITAL_COMMUNITY)
Admission: EM | Admit: 2019-07-08 | Discharge: 2019-07-08 | Disposition: A | Discharge status: Home / Self Care | Payer: Medicaid Other | Attending: Emergency Medicine | Admitting: Emergency Medicine

## 2019-07-08 ENCOUNTER — Encounter (HOSPITAL_COMMUNITY): Payer: Self-pay

## 2019-07-08 DIAGNOSIS — R079 Chest pain, unspecified: Secondary | ICD-10-CM | POA: Diagnosis not present

## 2019-07-08 DIAGNOSIS — J111 Influenza due to unidentified influenza virus with other respiratory manifestations: Secondary | ICD-10-CM

## 2019-07-08 DIAGNOSIS — I220 Subsequent ST elevation (STEMI) myocardial infarction of anterior wall: Secondary | ICD-10-CM | POA: Diagnosis not present

## 2019-07-08 DIAGNOSIS — E876 Hypokalemia: Secondary | ICD-10-CM

## 2019-07-08 LAB — COMPREHENSIVE METABOLIC PANEL
ALT: 17 U/L (ref 0–44)
AST: 20 U/L (ref 15–41)
Albumin: 3.6 g/dL (ref 3.5–5.0)
Alkaline Phosphatase: 100 U/L (ref 38–126)
Anion gap: 12 (ref 5–15)
BUN: 10 mg/dL (ref 6–20)
CO2: 25 mmol/L (ref 22–32)
Calcium: 8.2 mg/dL — ABNORMAL LOW (ref 8.9–10.3)
Chloride: 104 mmol/L (ref 98–111)
Creatinine, Ser: 0.94 mg/dL (ref 0.44–1.00)
GFR calc Af Amer: >60 mL/min (ref >60)
GFR calc non Af Amer: >60 mL/min (ref >60)
Glucose, Bld: 98 mg/dL (ref 70–99)
Potassium: 2.6 mmol/L — CL (ref 3.5–5.1)
Sodium: 141 mmol/L (ref 135–145)
Total Bilirubin: 0.3 mg/dL (ref 0.3–1.2)
Total Protein: 6.5 g/dL (ref 6.5–8.1)

## 2019-07-08 LAB — CBC WITH DIFFERENTIAL/PLATELET
Abs Immature Granulocytes: 0.08 10*3/uL — ABNORMAL HIGH (ref 0.00–0.07)
Basophils Absolute: 0 10*3/uL (ref 0.0–0.1)
Basophils Relative: 0 %
Eosinophils Absolute: 0.1 10*3/uL (ref 0.0–0.5)
Eosinophils Relative: 1 %
HCT: 41.7 % (ref 36.0–46.0)
Hemoglobin: 13.6 g/dL (ref 12.0–15.0)
Immature Granulocytes: 1 %
Lymphocytes Relative: 22 %
Lymphs Abs: 2.5 10*3/uL (ref 0.7–4.0)
MCH: 28.6 pg (ref 26.0–34.0)
MCHC: 32.6 g/dL (ref 30.0–36.0)
MCV: 87.8 fL (ref 80.0–100.0)
Monocytes Absolute: 0.9 10*3/uL (ref 0.1–1.0)
Monocytes Relative: 8 %
Neutro Abs: 7.8 10*3/uL — ABNORMAL HIGH (ref 1.7–7.7)
Neutrophils Relative %: 68 %
Platelets: 447 10*3/uL — ABNORMAL HIGH (ref 150–400)
RBC: 4.75 MIL/uL (ref 3.87–5.11)
RDW: 15.2 % (ref 11.5–15.5)
WBC: 11.3 10*3/uL — ABNORMAL HIGH (ref 4.0–10.5)
nRBC: 0 % (ref 0.0–0.2)

## 2019-07-08 LAB — LACTIC ACID, PLASMA
Lactic Acid, Venous: 1.3 mmol/L (ref 0.5–1.9)
Lactic Acid, Venous: 1.5 mmol/L (ref 0.5–1.9)

## 2019-07-08 LAB — I-STAT BETA HCG BLOOD, ED (NOT ORDERABLE): I-stat hCG, quantitative: <5 m[IU]/mL (ref <5)

## 2019-07-08 LAB — TROPONIN I (HIGH SENSITIVITY)
Troponin I (High Sensitivity): 9 ng/L (ref <18)
Troponin I (High Sensitivity): 7 ng/L (ref <18)

## 2019-07-08 MED ORDER — POTASSIUM CHLORIDE 10 MEQ/100ML IV SOLN
10.0000 meq | INTRAVENOUS | Status: AC
  Administered 2019-07-08 (×2): 10 meq via INTRAVENOUS
  Filled 2019-07-08 (×2): qty 100

## 2019-07-08 MED ORDER — SODIUM CHLORIDE 0.9% FLUSH
3.0000 mL | Freq: Once | INTRAVENOUS | Status: AC
  Administered 2019-07-08: 3 mL via INTRAVENOUS

## 2019-07-08 MED ORDER — POTASSIUM CHLORIDE CRYS ER 20 MEQ PO TBCR: 20.0000 meq | EXTENDED_RELEASE_TABLET | Freq: Two times a day (BID) | ORAL | 0 refills | Status: DC

## 2019-07-08 MED ORDER — ASPIRIN 81 MG PO CHEW: 324.0000 mg | CHEWABLE_TABLET | Freq: Once | ORAL | Status: DC

## 2019-07-08 MED ORDER — POTASSIUM CHLORIDE CRYS ER 20 MEQ PO TBCR
40.0000 meq | EXTENDED_RELEASE_TABLET | Freq: Once | ORAL | Status: AC
  Administered 2019-07-08: 03:00:00 40 meq via ORAL
  Filled 2019-07-08: qty 2
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
## 2019-07-08 NOTE — ED Notes (Signed)
Date and time results received: 07/08/19 0216 (use smartphrase ".now" to insert current time)  Test: k+ Critical Value: 2.6  Name of Provider Notified: Roxanne Mins MD  Orders Received? Or Actions Taken?:No orders

## 2019-07-08 NOTE — ED Triage Notes (Addendum)
Pt reports L sided chest pain. She states that it started this morning when she woke up. She also states, "I feel like I have the virus." She reports cough, headache, and malaise. Fever of 100.9 at home. A&Ox4. Ambulatory. Hx MI.

## 2019-07-08 NOTE — Discharge Instructions (Addendum)
Follow up with your family md this week °

## 2019-07-08 NOTE — ED Notes (Signed)
Discharge paperwork reviewed with pt, including prescriptions.  Pt requested that prescription be electronically sent to [facility] on [address], as that is her primary pharmacy.  Pharmacy Tech notified of this request, was able to accommodate.  Pt requesting wheelchair to ED exit to meet ride.

## 2019-07-09 DIAGNOSIS — I220 Subsequent ST elevation (STEMI) myocardial infarction of anterior wall: Secondary | ICD-10-CM | POA: Diagnosis not present

## 2019-07-09 MED FILL — POTASSIUM CHLORIDE CRYS ER: 20 | 5 days supply | Qty: 10 | Fill #0

## 2019-07-10 ENCOUNTER — Other Ambulatory Visit: Payer: Self-pay

## 2019-07-10 ENCOUNTER — Telehealth (INDEPENDENT_AMBULATORY_CARE_PROVIDER_SITE_OTHER): Payer: Medicaid Other | Admitting: Family Medicine

## 2019-07-10 DIAGNOSIS — E876 Hypokalemia: Secondary | ICD-10-CM

## 2019-07-10 DIAGNOSIS — I220 Subsequent ST elevation (STEMI) myocardial infarction of anterior wall: Secondary | ICD-10-CM | POA: Diagnosis not present

## 2019-07-10 NOTE — Progress Notes (Signed)
[facility] Telemedicine Visit  Patient consented to have virtual visit. Method of visit: Telephone  Encounter participants: Patient: [name] - located at home Provider: [name] - located at [facility] Others (if applicable): none  Chief Complaint: hypokalemia  HPI:  Patient reports that she went to the emergency department 2 days ago due to generalized weakness, and she was found to have a potassium level of 2.6.  She attributes this to only taking 2 tablets of K-Dur rather than her usual 3 that day.  She was tested for COVID at that time due to her generalized weakness and this test is still pending, so we were unable to see her in clinic today.  She does not think that she had COVID and denies shortness of breath and cough.  She felt much better after her potassium was repleted in the emergency department.  She would like to hear from [name] when the result of her COVID testing returns.  ROS: per HPI  Pertinent PMHx: Hypokalemia, thrombocytosis, breast cancer  Exam:  Respiratory: Able to speak in full sentences without shortness of breath and no cough heard.  Assessment/Plan:  Hypokalemia The combination of patient's persistent hypokalemia despite removal of potassium wasting medications and hypertension is concerning for hyperaldosteronism.  When we are able to see her back in the clinic, we need to check a BMP as well as renin level.  She will continue taking 60 mEq K-Dur until we can measure these levels.  I will discuss this with her when I call her regarding her COVID result.    Time spent during visit with patient: 7 minutes

## 2019-07-10 NOTE — Assessment & Plan Note (Signed)
The combination of patient's persistent hypokalemia despite removal of potassium wasting medications and hypertension is concerning for hyperaldosteronism.  When we are able to see her back in the clinic, we need to check a BMP as well as renin level.  She will continue taking 60 mEq K-Dur until we can measure these levels.  I will discuss this with her when I call her regarding her COVID result.

## 2019-07-11 ENCOUNTER — Other Ambulatory Visit: Payer: Self-pay | Admitting: Adult Health

## 2019-07-11 DIAGNOSIS — Z853 Personal history of malignant neoplasm of breast: Secondary | ICD-10-CM

## 2019-07-11 DIAGNOSIS — G8929 Other chronic pain: Secondary | ICD-10-CM

## 2019-07-11 DIAGNOSIS — M792 Neuralgia and neuritis, unspecified: Secondary | ICD-10-CM

## 2019-07-11 DIAGNOSIS — I10 Essential (primary) hypertension: Secondary | ICD-10-CM

## 2019-07-11 DIAGNOSIS — I220 Subsequent ST elevation (STEMI) myocardial infarction of anterior wall: Secondary | ICD-10-CM | POA: Diagnosis not present

## 2019-07-11 MED ORDER — METHADONE HCL 5 MG PO TABS: 15.0000 mg | ORAL_TABLET | Freq: Three times a day (TID) | ORAL | 0 refills | Status: DC | PRN

## 2019-07-11 NOTE — Progress Notes (Signed)
Refill written for patient to pick up Methadone, 15mg  TID (#270 of 5mg  tablets).  Patient on chronically.  PMP aware reviewed.  Last fill on 06/15/2019 per PMP aware, written for 07/13/2019 as patient picking up on this time.    Wilber Bihari, NP

## 2019-07-12 ENCOUNTER — Other Ambulatory Visit: Payer: Self-pay | Admitting: Oncology

## 2019-07-12 DIAGNOSIS — G8929 Other chronic pain: Secondary | ICD-10-CM

## 2019-07-12 DIAGNOSIS — M792 Neuralgia and neuritis, unspecified: Secondary | ICD-10-CM

## 2019-07-12 DIAGNOSIS — Z853 Personal history of malignant neoplasm of breast: Secondary | ICD-10-CM

## 2019-07-12 DIAGNOSIS — I10 Essential (primary) hypertension: Secondary | ICD-10-CM

## 2019-07-12 DIAGNOSIS — I220 Subsequent ST elevation (STEMI) myocardial infarction of anterior wall: Secondary | ICD-10-CM | POA: Diagnosis not present

## 2019-07-12 LAB — NOVEL CORONAVIRUS, NAA (HOSP ORDER, SEND-OUT TO REF LAB; TAT 18-24 HRS): SARS-CoV-2, NAA: NOT DETECTED

## 2019-07-13 ENCOUNTER — Emergency Department (HOSPITAL_COMMUNITY): Payer: Medicaid Other

## 2019-07-13 ENCOUNTER — Other Ambulatory Visit: Payer: Self-pay

## 2019-07-13 ENCOUNTER — Encounter (HOSPITAL_COMMUNITY): Payer: Self-pay | Admitting: Emergency Medicine

## 2019-07-13 ENCOUNTER — Inpatient Hospital Stay (HOSPITAL_COMMUNITY)
Admission: EM | Admit: 2019-07-13 | Discharge: 2019-07-17 | DRG: 603 | Disposition: A | Discharge status: Home / Self Care | Payer: Medicaid Other | Attending: Internal Medicine | Admitting: Internal Medicine

## 2019-07-13 DIAGNOSIS — I252 Old myocardial infarction: Secondary | ICD-10-CM

## 2019-07-13 DIAGNOSIS — I10 Essential (primary) hypertension: Secondary | ICD-10-CM | POA: Diagnosis present

## 2019-07-13 DIAGNOSIS — E785 Hyperlipidemia, unspecified: Secondary | ICD-10-CM | POA: Diagnosis not present

## 2019-07-13 DIAGNOSIS — Z79899 Other long term (current) drug therapy: Secondary | ICD-10-CM

## 2019-07-13 DIAGNOSIS — Z79891 Long term (current) use of opiate analgesic: Secondary | ICD-10-CM

## 2019-07-13 DIAGNOSIS — L03114 Cellulitis of left upper limb: Secondary | ICD-10-CM | POA: Diagnosis not present

## 2019-07-13 DIAGNOSIS — I220 Subsequent ST elevation (STEMI) myocardial infarction of anterior wall: Secondary | ICD-10-CM | POA: Diagnosis not present

## 2019-07-13 DIAGNOSIS — E876 Hypokalemia: Secondary | ICD-10-CM | POA: Diagnosis present

## 2019-07-13 DIAGNOSIS — I890 Lymphedema, not elsewhere classified: Secondary | ICD-10-CM

## 2019-07-13 DIAGNOSIS — C50811 Malignant neoplasm of overlapping sites of right female breast: Secondary | ICD-10-CM | POA: Diagnosis present

## 2019-07-13 DIAGNOSIS — Z955 Presence of coronary angioplasty implant and graft: Secondary | ICD-10-CM

## 2019-07-13 DIAGNOSIS — Z7982 Long term (current) use of aspirin: Secondary | ICD-10-CM

## 2019-07-13 DIAGNOSIS — R079 Chest pain, unspecified: Secondary | ICD-10-CM | POA: Diagnosis not present

## 2019-07-13 DIAGNOSIS — M109 Gout, unspecified: Secondary | ICD-10-CM | POA: Diagnosis present

## 2019-07-13 DIAGNOSIS — Z853 Personal history of malignant neoplasm of breast: Secondary | ICD-10-CM

## 2019-07-13 DIAGNOSIS — Z9221 Personal history of antineoplastic chemotherapy: Secondary | ICD-10-CM

## 2019-07-13 DIAGNOSIS — I2510 Atherosclerotic heart disease of native coronary artery without angina pectoris: Secondary | ICD-10-CM | POA: Diagnosis present

## 2019-07-13 DIAGNOSIS — Z923 Personal history of irradiation: Secondary | ICD-10-CM

## 2019-07-13 DIAGNOSIS — Z9013 Acquired absence of bilateral breasts and nipples: Secondary | ICD-10-CM

## 2019-07-13 DIAGNOSIS — F17210 Nicotine dependence, cigarettes, uncomplicated: Secondary | ICD-10-CM | POA: Diagnosis present

## 2019-07-13 DIAGNOSIS — M7989 Other specified soft tissue disorders: Secondary | ICD-10-CM | POA: Diagnosis not present

## 2019-07-13 DIAGNOSIS — L0390 Cellulitis, unspecified: Secondary | ICD-10-CM | POA: Diagnosis present

## 2019-07-13 DIAGNOSIS — Z20828 Contact with and (suspected) exposure to other viral communicable diseases: Secondary | ICD-10-CM | POA: Diagnosis present

## 2019-07-13 DIAGNOSIS — C50412 Malignant neoplasm of upper-outer quadrant of left female breast: Secondary | ICD-10-CM | POA: Diagnosis present

## 2019-07-13 LAB — CBC WITH DIFFERENTIAL/PLATELET
Abs Immature Granulocytes: 0.47 10*3/uL — ABNORMAL HIGH (ref 0.00–0.07)
Basophils Absolute: 0.1 10*3/uL (ref 0.0–0.1)
Basophils Relative: 0 %
Eosinophils Absolute: 0 10*3/uL (ref 0.0–0.5)
Eosinophils Relative: 0 %
HCT: 41.7 % (ref 36.0–46.0)
Hemoglobin: 13.5 g/dL (ref 12.0–15.0)
Immature Granulocytes: 2 %
Lymphocytes Relative: 9 %
Lymphs Abs: 1.8 10*3/uL (ref 0.7–4.0)
MCH: 28.8 pg (ref 26.0–34.0)
MCHC: 32.4 g/dL (ref 30.0–36.0)
MCV: 89.1 fL (ref 80.0–100.0)
Monocytes Absolute: 0.8 10*3/uL (ref 0.1–1.0)
Monocytes Relative: 4 %
Neutro Abs: 17.8 10*3/uL — ABNORMAL HIGH (ref 1.7–7.7)
Neutrophils Relative %: 85 %
Platelets: 430 10*3/uL — ABNORMAL HIGH (ref 150–400)
RBC: 4.68 MIL/uL (ref 3.87–5.11)
RDW: 15.5 % (ref 11.5–15.5)
WBC: 20.9 10*3/uL — ABNORMAL HIGH (ref 4.0–10.5)
nRBC: 0 % (ref 0.0–0.2)

## 2019-07-13 LAB — COMPREHENSIVE METABOLIC PANEL
ALT: 20 U/L (ref 0–44)
AST: 21 U/L (ref 15–41)
Albumin: 3.7 g/dL (ref 3.5–5.0)
Alkaline Phosphatase: 89 U/L (ref 38–126)
Anion gap: 13 (ref 5–15)
BUN: 14 mg/dL (ref 6–20)
CO2: 20 mmol/L — ABNORMAL LOW (ref 22–32)
Calcium: 8.9 mg/dL (ref 8.9–10.3)
Chloride: 105 mmol/L (ref 98–111)
Creatinine, Ser: 0.99 mg/dL (ref 0.44–1.00)
GFR calc Af Amer: >60 mL/min (ref >60)
GFR calc non Af Amer: >60 mL/min (ref >60)
Glucose, Bld: 103 mg/dL — ABNORMAL HIGH (ref 70–99)
Potassium: 3.4 mmol/L — ABNORMAL LOW (ref 3.5–5.1)
Sodium: 138 mmol/L (ref 135–145)
Total Bilirubin: 0.8 mg/dL (ref 0.3–1.2)
Total Protein: 7.1 g/dL (ref 6.5–8.1)

## 2019-07-13 LAB — PROTIME-INR
INR: 1.1 (ref 0.8–1.2)
Prothrombin Time: 14.5 seconds (ref 11.4–15.2)

## 2019-07-13 LAB — LACTIC ACID, PLASMA: Lactic Acid, Venous: 1.3 mmol/L (ref 0.5–1.9)

## 2019-07-13 LAB — APTT: aPTT: 30 seconds (ref 24–36)

## 2019-07-13 MED ORDER — SODIUM CHLORIDE 0.9 % IV SOLN
2.0000 g | INTRAVENOUS | Status: DC
  Administered 2019-07-13 – 2019-07-15 (×3): 2 g via INTRAVENOUS
  Filled 2019-07-13 (×2): qty 20
  Filled 2019-07-13 (×2): qty 2

## 2019-07-13 MED ORDER — HYDROMORPHONE HCL 1 MG/ML IJ SOLN
1.0000 mg | Freq: Once | INTRAMUSCULAR | Status: AC
  Administered 2019-07-13: 1 mg via INTRAVENOUS
  Filled 2019-07-13: qty 1

## 2019-07-13 MED ORDER — VANCOMYCIN HCL 10 G IV SOLR
2000.0000 mg | Freq: Once | INTRAVENOUS | Status: AC
  Administered 2019-07-14: 2000 mg via INTRAVENOUS
  Filled 2019-07-13: qty 2000

## 2019-07-13 MED FILL — METHADONE HCL 5 MG TABLET: 5 | 30 days supply | Qty: 270 | Fill #0
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
## 2019-07-13 NOTE — ED Triage Notes (Signed)
Patient c/o fever, headache and swelling to left arm since this morning. Hx breast cancer, lymphedema, and cellulitis. Reports negative for COVID last week.

## 2019-07-13 NOTE — Progress Notes (Signed)
A consult was received from an ED physician for vancomycin per pharmacy dosing.  The patient's profile has been reviewed for ht/wt/allergies/indication/available labs.   A one time order has been placed for Vancomycin 2 Gm .  Further antibiotics/pharmacy consults should be ordered by admitting physician if indicated.                       Thank you, Dorrene German 07/13/2019  10:50 PM

## 2019-07-14 ENCOUNTER — Observation Stay (HOSPITAL_BASED_OUTPATIENT_CLINIC_OR_DEPARTMENT_OTHER): Payer: Medicaid Other

## 2019-07-14 ENCOUNTER — Observation Stay (HOSPITAL_COMMUNITY): Payer: Medicaid Other

## 2019-07-14 DIAGNOSIS — E876 Hypokalemia: Secondary | ICD-10-CM

## 2019-07-14 DIAGNOSIS — R609 Edema, unspecified: Secondary | ICD-10-CM

## 2019-07-14 DIAGNOSIS — Z853 Personal history of malignant neoplasm of breast: Secondary | ICD-10-CM | POA: Diagnosis not present

## 2019-07-14 DIAGNOSIS — I890 Lymphedema, not elsewhere classified: Secondary | ICD-10-CM | POA: Diagnosis present

## 2019-07-14 DIAGNOSIS — E785 Hyperlipidemia, unspecified: Secondary | ICD-10-CM | POA: Diagnosis not present

## 2019-07-14 DIAGNOSIS — M7989 Other specified soft tissue disorders: Secondary | ICD-10-CM | POA: Diagnosis not present

## 2019-07-14 DIAGNOSIS — I10 Essential (primary) hypertension: Secondary | ICD-10-CM | POA: Diagnosis not present

## 2019-07-14 DIAGNOSIS — Z7982 Long term (current) use of aspirin: Secondary | ICD-10-CM | POA: Diagnosis not present

## 2019-07-14 DIAGNOSIS — R0602 Shortness of breath: Secondary | ICD-10-CM | POA: Diagnosis not present

## 2019-07-14 DIAGNOSIS — Z20828 Contact with and (suspected) exposure to other viral communicable diseases: Secondary | ICD-10-CM | POA: Diagnosis not present

## 2019-07-14 DIAGNOSIS — I220 Subsequent ST elevation (STEMI) myocardial infarction of anterior wall: Secondary | ICD-10-CM | POA: Diagnosis not present

## 2019-07-14 DIAGNOSIS — Z79891 Long term (current) use of opiate analgesic: Secondary | ICD-10-CM | POA: Diagnosis not present

## 2019-07-14 DIAGNOSIS — Z9221 Personal history of antineoplastic chemotherapy: Secondary | ICD-10-CM | POA: Diagnosis not present

## 2019-07-14 DIAGNOSIS — C50412 Malignant neoplasm of upper-outer quadrant of left female breast: Secondary | ICD-10-CM

## 2019-07-14 DIAGNOSIS — Z79899 Other long term (current) drug therapy: Secondary | ICD-10-CM | POA: Diagnosis not present

## 2019-07-14 DIAGNOSIS — L03114 Cellulitis of left upper limb: Secondary | ICD-10-CM

## 2019-07-14 DIAGNOSIS — Z9013 Acquired absence of bilateral breasts and nipples: Secondary | ICD-10-CM | POA: Diagnosis not present

## 2019-07-14 DIAGNOSIS — M109 Gout, unspecified: Secondary | ICD-10-CM | POA: Diagnosis present

## 2019-07-14 DIAGNOSIS — Z923 Personal history of irradiation: Secondary | ICD-10-CM | POA: Diagnosis not present

## 2019-07-14 DIAGNOSIS — I2510 Atherosclerotic heart disease of native coronary artery without angina pectoris: Secondary | ICD-10-CM | POA: Diagnosis present

## 2019-07-14 DIAGNOSIS — I252 Old myocardial infarction: Secondary | ICD-10-CM | POA: Diagnosis not present

## 2019-07-14 DIAGNOSIS — R079 Chest pain, unspecified: Secondary | ICD-10-CM | POA: Diagnosis not present

## 2019-07-14 DIAGNOSIS — Z955 Presence of coronary angioplasty implant and graft: Secondary | ICD-10-CM | POA: Diagnosis not present

## 2019-07-14 DIAGNOSIS — F17210 Nicotine dependence, cigarettes, uncomplicated: Secondary | ICD-10-CM | POA: Diagnosis present

## 2019-07-14 LAB — CBC
HCT: 38.5 % (ref 36.0–46.0)
Hemoglobin: 12.4 g/dL (ref 12.0–15.0)
MCH: 28.7 pg (ref 26.0–34.0)
MCHC: 32.2 g/dL (ref 30.0–36.0)
MCV: 89.1 fL (ref 80.0–100.0)
Platelets: 419 10*3/uL — ABNORMAL HIGH (ref 150–400)
RBC: 4.32 MIL/uL (ref 3.87–5.11)
RDW: 15.5 % (ref 11.5–15.5)
WBC: 18.6 10*3/uL — ABNORMAL HIGH (ref 4.0–10.5)
nRBC: 0 % (ref 0.0–0.2)

## 2019-07-14 LAB — COMPREHENSIVE METABOLIC PANEL
ALT: 18 U/L (ref 0–44)
AST: 20 U/L (ref 15–41)
Albumin: 3.3 g/dL — ABNORMAL LOW (ref 3.5–5.0)
Alkaline Phosphatase: 82 U/L (ref 38–126)
Anion gap: 7 (ref 5–15)
BUN: 14 mg/dL (ref 6–20)
CO2: 21 mmol/L — ABNORMAL LOW (ref 22–32)
Calcium: 8.7 mg/dL — ABNORMAL LOW (ref 8.9–10.3)
Chloride: 108 mmol/L (ref 98–111)
Creatinine, Ser: 0.93 mg/dL (ref 0.44–1.00)
GFR calc Af Amer: >60 mL/min (ref >60)
GFR calc non Af Amer: >60 mL/min (ref >60)
Glucose, Bld: 105 mg/dL — ABNORMAL HIGH (ref 70–99)
Potassium: 3.3 mmol/L — ABNORMAL LOW (ref 3.5–5.1)
Sodium: 136 mmol/L (ref 135–145)
Total Bilirubin: 0.6 mg/dL (ref 0.3–1.2)
Total Protein: 6.5 g/dL (ref 6.5–8.1)

## 2019-07-14 LAB — STREP PNEUMONIAE URINARY ANTIGEN: Strep Pneumo Urinary Antigen: NEGATIVE

## 2019-07-14 LAB — MAGNESIUM: Magnesium: 1.9 mg/dL (ref 1.7–2.4)

## 2019-07-14 LAB — SARS CORONAVIRUS 2 BY RT PCR (HOSPITAL ORDER, PERFORMED IN ~~LOC~~ HOSPITAL LAB): SARS Coronavirus 2: NEGATIVE

## 2019-07-14 LAB — HIV ANTIBODY (ROUTINE TESTING W REFLEX): HIV Screen 4th Generation wRfx: NONREACTIVE

## 2019-07-14 LAB — POTASSIUM: Potassium: 3.9 mmol/L (ref 3.5–5.1)

## 2019-07-14 MED ORDER — SODIUM CHLORIDE 0.9 % IV SOLN: 250.0000 mL | INTRAVENOUS | Status: DC | PRN

## 2019-07-14 MED ORDER — METHADONE HCL 5 MG PO TABS
15.0000 mg | ORAL_TABLET | Freq: Three times a day (TID) | ORAL | Status: DC | PRN
  Administered 2019-07-14 – 2019-07-16 (×4): 15 mg via ORAL
  Filled 2019-07-14 (×4): qty 1

## 2019-07-14 MED ORDER — TRAMADOL HCL 50 MG PO TABS
50.0000 mg | ORAL_TABLET | Freq: Four times a day (QID) | ORAL | Status: DC | PRN
  Administered 2019-07-14: 50 mg via ORAL
  Filled 2019-07-14 (×2): qty 1

## 2019-07-14 MED ORDER — ATORVASTATIN CALCIUM 40 MG PO TABS
40.0000 mg | ORAL_TABLET | Freq: Every day | ORAL | Status: DC
  Administered 2019-07-14 – 2019-07-16 (×3): 40 mg via ORAL
  Filled 2019-07-14 (×3): qty 1

## 2019-07-14 MED ORDER — ALLOPURINOL 300 MG PO TABS
300.0000 mg | ORAL_TABLET | Freq: Every day | ORAL | Status: DC
  Administered 2019-07-14 – 2019-07-17 (×4): 300 mg via ORAL
  Filled 2019-07-14 (×4): qty 1

## 2019-07-14 MED ORDER — AMLODIPINE BESYLATE 10 MG PO TABS
10.0000 mg | ORAL_TABLET | Freq: Every day | ORAL | Status: DC
  Administered 2019-07-14 – 2019-07-17 (×4): 10 mg via ORAL
  Filled 2019-07-14 (×4): qty 1

## 2019-07-14 MED ORDER — ISOSORBIDE MONONITRATE ER 60 MG PO TB24
60.0000 mg | ORAL_TABLET | Freq: Every day | ORAL | Status: DC
  Administered 2019-07-14 – 2019-07-16 (×3): 60 mg via ORAL
  Filled 2019-07-14 (×3): qty 1

## 2019-07-14 MED ORDER — SODIUM CHLORIDE 0.9% FLUSH
3.0000 mL | Freq: Two times a day (BID) | INTRAVENOUS | Status: DC
  Administered 2019-07-14 – 2019-07-17 (×8): 3 mL via INTRAVENOUS

## 2019-07-14 MED ORDER — SODIUM CHLORIDE 0.9 % IV SOLN
500.0000 mg | INTRAVENOUS | Status: DC
  Administered 2019-07-14: 500 mg via INTRAVENOUS
  Filled 2019-07-14: qty 500

## 2019-07-14 MED ORDER — VANCOMYCIN HCL 10 G IV SOLR
1250.0000 mg | Freq: Every day | INTRAVENOUS | Status: DC
  Administered 2019-07-15 – 2019-07-16 (×2): 1250 mg via INTRAVENOUS
  Filled 2019-07-14 (×2): qty 1250

## 2019-07-14 MED ORDER — ENOXAPARIN SODIUM 40 MG/0.4ML ~~LOC~~ SOLN
40.0000 mg | Freq: Every day | SUBCUTANEOUS | Status: DC
  Administered 2019-07-14 – 2019-07-17 (×4): 40 mg via SUBCUTANEOUS
  Filled 2019-07-14 (×4): qty 0.4

## 2019-07-14 MED ORDER — VITAMIN D (ERGOCALCIFEROL) 1.25 MG (50000 UNIT) PO CAPS: 50000.0000 [IU] | ORAL_CAPSULE | ORAL | Status: DC

## 2019-07-14 MED ORDER — TAMOXIFEN CITRATE 10 MG PO TABS
20.0000 mg | ORAL_TABLET | Freq: Every day | ORAL | Status: DC
  Administered 2019-07-14 – 2019-07-16 (×3): 20 mg via ORAL
  Filled 2019-07-14 (×4): qty 2

## 2019-07-14 MED ORDER — ASPIRIN EC 81 MG PO TBEC
81.0000 mg | DELAYED_RELEASE_TABLET | Freq: Every day | ORAL | Status: DC
  Administered 2019-07-14 – 2019-07-16 (×3): 81 mg via ORAL
  Filled 2019-07-14 (×3): qty 1

## 2019-07-14 MED ORDER — POTASSIUM CHLORIDE CRYS ER 20 MEQ PO TBCR
20.0000 meq | EXTENDED_RELEASE_TABLET | Freq: Two times a day (BID) | ORAL | Status: DC
  Administered 2019-07-14 – 2019-07-17 (×7): 20 meq via ORAL
  Filled 2019-07-14 (×7): qty 1

## 2019-07-14 MED ORDER — TRAMADOL HCL 50 MG PO TABS: 50.0000 mg | ORAL_TABLET | Freq: Four times a day (QID) | ORAL | Status: DC | PRN

## 2019-07-14 MED ORDER — SODIUM CHLORIDE 0.9% FLUSH: 3.0000 mL | INTRAVENOUS | Status: DC | PRN

## 2019-07-14 MED ORDER — METOPROLOL SUCCINATE ER 50 MG PO TB24
100.0000 mg | ORAL_TABLET | Freq: Every day | ORAL | Status: DC
  Administered 2019-07-14 – 2019-07-17 (×4): 100 mg via ORAL
  Filled 2019-07-14 (×4): qty 2

## 2019-07-14 NOTE — Progress Notes (Signed)
PROGRESS NOTE    [name]  IWL:[id number] DOB: 1961/08/08 DOA: 07/13/2019 PCP: [name], MD    Brief Narrative:  HPI per Dr. Max Fickle Goffredo  is a 58 y.o. female, w Hypertension, Hyperlipidemia,  CAD s/p mid RCA stent 2008, h/o breast cancer s/p bilateral mastectomies s/p chemo/ Xrt, w Chronic lymphedema,  Thrombocytosis, apparently presents w c/o redness and pain left forearm x1 day.  Subjective fever.  Pt notes a scratch on her left hand about 1 week ago. Pt denies cough, cp, palp, sob, n/v, diarrhea, brbpr, black stool, dysuria, hematuria.   In ED,  T 98.5, P 97 R 24  Bp 132/98  Pox 98%  CXR IMPRESSION: Increasing left basilar infiltrate.  Na 138, K 3.4 Bun 14, Creatinine 0.99 Ast 21, Alt 20  Wbc 20.9, Hgb 13.5, Plt 430 Blood culture x2 pending  Pt given rocephin iv in Ed,   Pt will be admitted for cellulitis L forearm as well as ?CAP  Assessment & Plan:   Principal Problem:   Cellulitis Active Problems:   Hypertensive disorder   Dyslipidemia   Hypokalemia   Lymphedema of arm   Cancer of overlapping sites of right female breast ([place])   Breast cancer of upper-outer quadrant of left female breast ([place])   Cellulitis of left upper extremity   Cellulitis of left forearm  1 left upper extremity cellulitis Patient presented with 1 day history of fever, left upper extremity swelling, erythema, warmth and pain.  Plain films of the left upper extremity/forearm with mild soft tissue swelling without acute bony abnormality.  Blood cultures pending.  Upper extremity Dopplers negative for DVT.  Keep left upper extremity elevated will place an admission sling.  Continue empiric IV vancomycin and IV Rocephin.  Follow.  2.  Hypokalemia Magnesium level at 1.9.  Replete.  3.  Community-acquired pneumonia ruled out Chest x-ray done on admission concerning for possible pneumonia.  CT chest which was done was negative for any acute consolidative process.  Urine  strep antigen pending.  Urine pneumococcus antigen pending.  Patient on empiric IV antibiotics secondary to problem #1.  Will discontinue azithromycin.  Follow.  4.  Hypertension Currently controlled.  Continue current regimen of Toprol-XL and Norvasc and Imdur.  5.  Hyperlipidemia Continue statin.  6.  Coronary artery disease status post mid RCA stent 2008 Currently stable.  High-sensitivity troponin obtained was negative.  Continue Toprol-XL, Norvasc, Imdur, aspirin.  7.  History of breast cancer status post bilateral mastectomies Status post chemo and radiation treatment with chronic lymphedema.  Continue tamoxifen.  Oncology notified of patient's admission via epic.    DVT prophylaxis: Lovenox Code Status: Full Family Communication: Updated patient.  No family at bedside. Disposition Plan: Likely home when clinically improved.   Consultants:   Oncology notified via epic of patient's admission, Dr. [name]  Procedures:   CT chest 07/14/2019  Plain films of the left forearm 07/13/2019  Chest x-ray 07/13/2019  Bilateral lower extremity Doppler 07/14/2019--- negative for DVT        Bilateral upper extremity Dopplers 07/14/2019  Antimicrobials:   IV Rocephin 07/13/2019  IV vancomycin 07/13/2019  IV azithromycin 07/14/2019>>>> 07/14/2019   Subjective: Patient laying in bed.  States he feels a little bit better than since on admission.  Still with significant left upper extremity swelling.  Denies shortness of breath.  No significant chest pain.  Objective: Vitals:   07/14/19 0108 07/14/19 0504 07/14/19 0510 07/14/19 1312  BP: (!) 141/84  122/64 121/74  Pulse: 85  85 81  Resp: 18  17 (!) 22  Temp:   99.4 F (37.4 C) 99.1 F (37.3 C)  TempSrc: Oral  Oral Oral  SpO2: 96%  90% 95%  Weight:  99.5 kg    Height:  5\' 5"  (1.651 m)      Intake/Output Summary (Last 24 hours) at 07/14/2019 1524 Last data filed at 07/14/2019 0600 Gross per 24 hour  Intake 840 ml  Output 400  ml  Net 440 ml   Filed Weights   07/14/19 0504  Weight: 99.5 kg    Examination:  General exam: Appears calm and comfortable  Respiratory system: Clear to auscultation. Respiratory effort normal. Cardiovascular system: S1 & S2 heard, RRR. No JVD, murmurs, rubs, gallops or clicks. No pedal edema. Gastrointestinal system: Abdomen is nondistended, soft and nontender. No organomegaly or masses felt. Normal bowel sounds heard. Central nervous system: Alert and oriented. No focal neurological deficits. Extremities: Left upper extremity with erythema, warmth, edema, tenderness to palpation.  Skin: No rashes, lesions or ulcers Psychiatry: Judgement and insight appear normal. Mood & affect appropriate.     Data Reviewed: I have personally reviewed following labs and imaging studies  CBC: Recent Labs  Lab 07/08/19 0109 07/13/19 2256 07/14/19 0622  WBC 11.3* 20.9* 18.6*  NEUTROABS 7.8* 17.8*  --   HGB 13.6 13.5 12.4  HCT 41.7 41.7 38.5  MCV 87.8 89.1 89.1  PLT 447* 430* 458*   Basic Metabolic Panel: Recent Labs  Lab 07/08/19 0109 07/13/19 2256 07/14/19 0617 07/14/19 0622  NA 141 138  --  136  K 2.6* 3.4*  --  3.3*  CL 104 105  --  108  CO2 25 20*  --  21*  GLUCOSE 98 103*  --  105*  BUN 10 14  --  14  CREATININE 0.94 0.99  --  0.93  CALCIUM 8.2* 8.9  --  8.7*  MG  --   --  1.9  --    GFR: Estimated Creatinine Clearance: 79.8 mL/min (by C-G formula based on SCr of 0.93 mg/dL). Liver Function Tests: Recent Labs  Lab 07/08/19 0109 07/13/19 2256 07/14/19 0622  AST 20 21 20   ALT 17 20 18   ALKPHOS 100 89 82  BILITOT 0.3 0.8 0.6  PROT 6.5 7.1 6.5  ALBUMIN 3.6 3.7 3.3*   No results for input(s): LIPASE, AMYLASE in the last 168 hours. No results for input(s): AMMONIA in the last 168 hours. Coagulation Profile: Recent Labs  Lab 07/13/19 2256  INR 1.1   Cardiac Enzymes: No results for input(s): CKTOTAL, CKMB, CKMBINDEX, TROPONINI in the last 168 hours. BNP (last  3 results) No results for input(s): PROBNP in the last 8760 hours. HbA1C: No results for input(s): HGBA1C in the last 72 hours. CBG: No results for input(s): GLUCAP in the last 168 hours. Lipid Profile: No results for input(s): CHOL, HDL, LDLCALC, TRIG, CHOLHDL, LDLDIRECT in the last 72 hours. Thyroid Function Tests: No results for input(s): TSH, T4TOTAL, FREET4, T3FREE, THYROIDAB in the last 72 hours. Anemia Panel: No results for input(s): VITAMINB12, FOLATE, FERRITIN, TIBC, IRON, RETICCTPCT in the last 72 hours. Sepsis Labs: Recent Labs  Lab 07/08/19 0109 07/08/19 0532 07/13/19 2256  LATICACIDVEN 1.3 1.5 1.3    Recent Results (from the past 240 hour(s))  Novel Coronavirus,NAA,(SEND-OUT TO REF LAB - TAT 24-48 hrs); Hosp Order     Status: None   Collection Time: 07/08/19  1:09 AM   Specimen:  Nasopharyngeal Swab; Respiratory  Result Value Ref Range Status   SARS-CoV-2, NAA NOT DETECTED NOT DETECTED Final    Comment: (NOTE) This test was developed and its performance characteristics determined by [employer]. This test has not been FDA cleared or approved. This test has been authorized by FDA under an Emergency Use Authorization (EUA). This test is only authorized for the duration of time the declaration that circumstances exist justifying the authorization of the emergency use of in vitro diagnostic tests for detection of SARS-CoV-2 virus and/or diagnosis of COVID-19 infection under section 564(b)(1) of the Act, 21 U.S.C. 810FBP-1(W)(2), unless the authorization is terminated or revoked sooner. When diagnostic testing is negative, the possibility of a false negative result should be considered in the context of a patient's recent exposures and the presence of clinical signs and symptoms consistent with COVID-19. An individual without symptoms of COVID-19 and who is not shedding SARS-CoV-2 virus would expect to have a negative (not detected) result in this  assay. Performed  At: [facility] [place], [place] [id number] Rush Farmer MD MP:[id number]    [place]  Final    Comment: Performed at [facility] [address], [place], [place] [address]  Blood Culture (routine x 2)     Status: None (Preliminary result)   Collection Time: 07/13/19 10:56 PM   Specimen: BLOOD RIGHT HAND  Result Value Ref Range Status   Specimen Description   Final    BLOOD RIGHT HAND Performed at [facility], [place] [address], [place], [place] [address]    Special Requests   Final    BOTTLES DRAWN AEROBIC AND ANAEROBIC Blood Culture adequate volume Performed at [facility] [place] [address], [place], [place] [address]    Culture PENDING  Incomplete   Report Status PENDING  Incomplete  SARS Coronavirus 2 (CEPHEID - Performed in [facility] hospital lab), Hosp Order     Status: None   Collection Time: 07/13/19 10:56 PM   Specimen: Nasopharyngeal Swab  Result Value Ref Range Status   SARS Coronavirus 2 NEGATIVE NEGATIVE Final    Comment: (NOTE) If result is NEGATIVE SARS-CoV-2 target nucleic acids are NOT DETECTED. The SARS-CoV-2 RNA is generally detectable in upper and lower  respiratory specimens during the acute phase of infection. The lowest  concentration of SARS-CoV-2 viral copies this assay can detect is 250  copies / mL. A negative result does not preclude SARS-CoV-2 infection  and should not be used as the sole basis for treatment or other  patient management decisions.  A negative result may occur with  improper specimen collection / handling, submission of specimen other  than nasopharyngeal swab, presence of viral mutation(s) within the  areas targeted by this assay, and inadequate number of viral copies  (<250 copies / mL). A negative result must be combined with clinical  observations, patient history, and epidemiological information. If result is  POSITIVE SARS-CoV-2 target nucleic acids are DETECTED. The SARS-CoV-2 RNA is generally detectable in upper and lower  respiratory specimens dur ing the acute phase of infection.  Positive  results are indicative of active infection with SARS-CoV-2.  Clinical  correlation with patient history and other diagnostic information is  necessary to determine patient infection status.  Positive results do  not rule out bacterial infection or co-infection with other viruses. If result is PRESUMPTIVE POSTIVE SARS-CoV-2 nucleic acids MAY BE PRESENT.   A presumptive positive result was obtained on the submitted  specimen  and confirmed on repeat testing.  While 2019 novel coronavirus  (SARS-CoV-2) nucleic acids may be present in the submitted sample  additional confirmatory testing may be necessary for epidemiological  and / or clinical management purposes  to differentiate between  SARS-CoV-2 and other Sarbecovirus currently known to infect humans.  If clinically indicated additional testing with an alternate test  methodology [phone]) is advised. The SARS-CoV-2 RNA is generally  detectable in upper and lower respiratory sp ecimens during the acute  phase of infection. The expected result is Negative. Fact Sheet for Patients:  StrictlyIdeas.no Fact Sheet for Healthcare Providers: BankingDealers.co.za This test is not yet approved or cleared by the [place] FDA and has been authorized for detection and/or diagnosis of SARS-CoV-2 by FDA under an Emergency Use Authorization (EUA).  This EUA will remain in effect (meaning this test can be used) for the duration of the COVID-19 declaration under Section 564(b)(1) of the Act, 21 U.S.C. section 360bbb-3(b)(1), unless the authorization is terminated or revoked sooner. Performed at [facility], [place] [address] [place], [place] [address]          Radiology Studies: Dg Forearm  Left  Result Date: 07/13/2019 CLINICAL DATA:  Fever and left arm swelling EXAM: LEFT FOREARM - 2 VIEW COMPARISON:  None. FINDINGS: No acute fracture or dislocation is noted. Generalized soft tissue swelling is noted. No foreign body is seen. IMPRESSION: Mild soft tissue swelling without acute bony abnormality. Electronically Signed   By: Inez Catalina M.D.   On: 07/13/2019 23:24   Ct Chest Wo Contrast  Result Date: 07/14/2019 CLINICAL DATA:  58 year old female with history of pneumonia. Shortness of breath. Evaluate for effusion. EXAM: CT CHEST WITHOUT CONTRAST TECHNIQUE: Multidetector CT imaging of the chest was performed following the standard protocol without IV contrast. COMPARISON:  Chest CT 11/17/2015. FINDINGS: Cardiovascular: Heart size is normal. There is no significant pericardial fluid, thickening or pericardial calcification. There is aortic atherosclerosis, as well as atherosclerosis of the great vessels of the mediastinum and the coronary arteries, including calcified atherosclerotic plaque in the left main, left anterior descending, left circumflex and right coronary arteries. Mediastinum/Nodes: No pathologically enlarged mediastinal or hilar lymph nodes. Please note that accurate exclusion of hilar adenopathy is limited on noncontrast CT scans. Esophagus is unremarkable in appearance. No axillary lymphadenopathy. Surgical clips in the left axilla from prior lymph node dissection. Lungs/Pleura: Dependent areas of scarring are noted in the lower lobes of the lungs bilaterally, similar to the prior study. No acute consolidative airspace disease. No pleural effusions. No suspicious appearing pulmonary nodules or masses are noted. Mild diffuse bronchial wall thickening with mild centrilobular and paraseptal emphysema. Upper Abdomen: Aortic atherosclerosis. Mild diffuse low attenuation throughout the visualized hepatic parenchyma, indicative of a background of hepatic steatosis. Musculoskeletal:  Status post bilateral modified radical mastectomy and left axillary lymph node dissection. There are no aggressive appearing lytic or blastic lesions noted in the visualized portions of the skeleton. IMPRESSION: 1. No pleural effusions. 2. No acute findings in the thorax. 3. Mild diffuse bronchial wall thickening with mild centrilobular and paraseptal emphysema; imaging findings suggestive of underlying COPD. 4. Mild hepatic steatosis. 5. Aortic atherosclerosis, in addition to left main and 3 vessel coronary artery disease. Please note that although the presence of coronary artery calcium documents the presence of coronary artery disease, the severity of this disease and any potential stenosis cannot be assessed on this non-gated CT examination. Assessment for potential risk factor modification, dietary therapy  or pharmacologic therapy may be warranted, if clinically indicated. Aortic Atherosclerosis (ICD10-I70.0) and Emphysema (ICD10-J43.9). Electronically Signed   By: Vinnie Langton M.D.   On: 07/14/2019 06:58   Dg Chest Port 1 View  Result Date: 07/13/2019 CLINICAL DATA:  Right-sided chest pain EXAM: PORTABLE CHEST 1 VIEW COMPARISON:  07/08/2019 FINDINGS: Cardiac shadow is stable. Aortic calcifications are again seen. Lungs are well aerated although increasing basilar infiltrate on the left is noted. No bony abnormality is noted. IMPRESSION: Increasing left basilar infiltrate. Electronically Signed   By: Inez Catalina M.D.   On: 07/13/2019 23:22   Vas [name] Lower Extremity Venous (dvt)  Result Date: 07/14/2019  Lower Venous Study Indications: Swelling.  Performing Technologist: Maudry Mayhew MHA, RDMS, RVT, RDCS  Examination Guidelines: A complete evaluation includes B-mode imaging, spectral Doppler, color Doppler, and power Doppler as needed of all accessible portions of each vessel. Bilateral testing is considered an integral part of a complete examination. Limited examinations for reoccurring  indications may be performed as noted.  +---------+---------------+---------+-----------+----------+-------+  RIGHT     Compressibility Phasicity Spontaneity Properties Summary  +---------+---------------+---------+-----------+----------+-------+  CFV       Full            Yes       Yes                             +---------+---------------+---------+-----------+----------+-------+  SFJ       Full                                                      +---------+---------------+---------+-----------+----------+-------+  FV Prox   Full                                                      +---------+---------------+---------+-----------+----------+-------+  FV Mid    Full                                                      +---------+---------------+---------+-----------+----------+-------+  FV Distal Full                                                      +---------+---------------+---------+-----------+----------+-------+  PFV       Full                                                      +---------+---------------+---------+-----------+----------+-------+  POP       Full            Yes       Yes                             +---------+---------------+---------+-----------+----------+-------+  PTV       Full                                                      +---------+---------------+---------+-----------+----------+-------+  PERO      Full                                                      +---------+---------------+---------+-----------+----------+-------+   +---------+---------------+---------+-----------+----------+-------+  LEFT      Compressibility Phasicity Spontaneity Properties Summary  +---------+---------------+---------+-----------+----------+-------+  CFV       Full            Yes       Yes                             +---------+---------------+---------+-----------+----------+-------+  SFJ       Full                                                       +---------+---------------+---------+-----------+----------+-------+  FV Prox   Full                                                      +---------+---------------+---------+-----------+----------+-------+  FV Mid    Full                                                      +---------+---------------+---------+-----------+----------+-------+  FV Distal Full                                                      +---------+---------------+---------+-----------+----------+-------+  PFV       Full                                                      +---------+---------------+---------+-----------+----------+-------+  POP       Full            Yes       Yes                             +---------+---------------+---------+-----------+----------+-------+  PTV       Full                                                      +---------+---------------+---------+-----------+----------+-------+  PERO      Full                                                      +---------+---------------+---------+-----------+----------+-------+     Summary: Right: There is no evidence of deep vein thrombosis in the lower extremity. No cystic structure found in the popliteal fossa. Left: There is no evidence of deep vein thrombosis in the lower extremity. No cystic structure found in the popliteal fossa.  *See table(s) above for measurements and observations. Electronically signed by Deitra Mayo MD on 07/14/2019 at 9:54:42 AM.    Final    Vas [name] Upper Extremity Venous Duplex  Result Date: 07/14/2019 UPPER VENOUS STUDY  Indications: Swelling, and left upper extremity lymphedema Performing Technologist: Maudry Mayhew MHA, RDMS, RVT, RDCS  Examination Guidelines: A complete evaluation includes B-mode imaging, spectral Doppler, color Doppler, and power Doppler as needed of all accessible portions of each vessel. Bilateral testing is considered an integral part of a complete examination. Limited examinations for reoccurring  indications may be performed as noted.  Right Findings: +----------+------------+---------+-----------+----------+-------+  RIGHT      Compressible Phasicity Spontaneous Properties Summary  +----------+------------+---------+-----------+----------+-------+  Subclavian                 Yes        Yes                         +----------+------------+---------+-----------+----------+-------+  Left Findings: +----------+------------+---------+-----------+----------+-------+  LEFT       Compressible Phasicity Spontaneous Properties Summary  +----------+------------+---------+-----------+----------+-------+  IJV            Full        Yes        Yes                         +----------+------------+---------+-----------+----------+-------+  Subclavian     Full        Yes        Yes                         +----------+------------+---------+-----------+----------+-------+  Axillary       Full        Yes        Yes                         +----------+------------+---------+-----------+----------+-------+  Brachial       Full        Yes        Yes                         +----------+------------+---------+-----------+----------+-------+  Radial         Full                                               +----------+------------+---------+-----------+----------+-------+  Ulnar          Full                                               +----------+------------+---------+-----------+----------+-------+  Cephalic       Full                                               +----------+------------+---------+-----------+----------+-------+  Basilic        Full                                               +----------+------------+---------+-----------+----------+-------+  Summary:  Right: No evidence of thrombosis in the subclavian.  Left: No evidence of deep vein thrombosis in the upper extremity. No evidence of superficial vein thrombosis in the upper extremity.  *See table(s) above for measurements and observations.  Diagnosing  physician: Deitra Mayo MD Electronically signed by Deitra Mayo MD on 07/14/2019 at 9:54:57 AM.    Final         Scheduled Meds:  allopurinol  300 mg Oral Daily   amLODipine  10 mg Oral Daily   aspirin EC  81 mg Oral QHS   atorvastatin  40 mg Oral q1800   enoxaparin (LOVENOX) injection  40 mg Subcutaneous Daily   isosorbide mononitrate  60 mg Oral QHS   metoprolol succinate  100 mg Oral Daily   potassium chloride SA  20 mEq Oral BID   sodium chloride flush  3 mL Intravenous Q12H   tamoxifen  20 mg Oral QHS   [START ON 07/19/2019] Vitamin D (Ergocalciferol)  50,000 Units Oral Q7 days   Continuous Infusions:  sodium chloride     cefTRIAXone (ROCEPHIN)  IV Stopped (07/13/19 2357)   [START ON 07/15/2019] vancomycin       LOS: 0 days    Time spent: 35 minutes    Irine Seal, MD [facility]  If 7PM-7AM, please contact night-coverage [email or web address] 07/14/2019, 3:24 PM

## 2019-07-14 NOTE — Progress Notes (Signed)
Pharmacy Antibiotic Note  [name] is a 58 y.o. female admitted on 07/13/2019 with cellulitis.  Pharmacy has been consulted for vancomycin dosing.  Plan: Vancomycin 2 Gm x1 then 1250 mg IV q24h for est AUC = 476 Goal AUC = 400-550 Rocephin 2 Gm IV q24h F/u scr/cultures/levels Rocephin 2 Gm IV q24h     Temp (24hrs), Avg:98.8 F (37.1 C), Min:98.5 F (36.9 C), Max:99.1 F (37.3 C)  Recent Labs  Lab 07/08/19 0109 07/08/19 0532 07/13/19 2256  WBC 11.3*  --  20.9*  CREATININE 0.94  --  0.99  LATICACIDVEN 1.3 1.5 1.3    Estimated Creatinine Clearance: 75.3 mL/min (by C-G formula based on SCr of 0.99 mg/dL).    No Known Allergies  Antimicrobials this admission: 7/24 rocephin >>  7/24 vancomycin >>   Dose adjustments this admission:   Microbiology results:  BCx:   UCx:    Sputum:    MRSA PCR:   Thank you for allowing pharmacy to be a part of this patient's care.  Dorrene German 07/14/2019 1:45 AM
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
## 2019-07-14 NOTE — Progress Notes (Signed)
Left upper extremity venous duplex and bilateral lower extremity venous duplex completed. Refer to "CV Proc" under chart review to view preliminary results.  07/14/2019 9:20 AM Maudry Mayhew, MHA, RVT, RDCS, RDMS

## 2019-07-15 DIAGNOSIS — I220 Subsequent ST elevation (STEMI) myocardial infarction of anterior wall: Secondary | ICD-10-CM | POA: Diagnosis not present

## 2019-07-15 LAB — BASIC METABOLIC PANEL
Anion gap: 8 (ref 5–15)
BUN: 11 mg/dL (ref 6–20)
CO2: 19 mmol/L — ABNORMAL LOW (ref 22–32)
Calcium: 8.3 mg/dL — ABNORMAL LOW (ref 8.9–10.3)
Chloride: 112 mmol/L — ABNORMAL HIGH (ref 98–111)
Creatinine, Ser: 0.86 mg/dL (ref 0.44–1.00)
GFR calc Af Amer: >60 mL/min (ref >60)
GFR calc non Af Amer: >60 mL/min (ref >60)
Glucose, Bld: 96 mg/dL (ref 70–99)
Potassium: 3.6 mmol/L (ref 3.5–5.1)
Sodium: 139 mmol/L (ref 135–145)

## 2019-07-15 LAB — CBC WITH DIFFERENTIAL/PLATELET
Abs Immature Granulocytes: 0.16 10*3/uL — ABNORMAL HIGH (ref 0.00–0.07)
Basophils Absolute: 0 10*3/uL (ref 0.0–0.1)
Basophils Relative: 0 %
Eosinophils Absolute: 0.2 10*3/uL (ref 0.0–0.5)
Eosinophils Relative: 1 %
HCT: 37.3 % (ref 36.0–46.0)
Hemoglobin: 11.7 g/dL — ABNORMAL LOW (ref 12.0–15.0)
Immature Granulocytes: 1 %
Lymphocytes Relative: 17 %
Lymphs Abs: 2.8 10*3/uL (ref 0.7–4.0)
MCH: 28.6 pg (ref 26.0–34.0)
MCHC: 31.4 g/dL (ref 30.0–36.0)
MCV: 91.2 fL (ref 80.0–100.0)
Monocytes Absolute: 1.2 10*3/uL — ABNORMAL HIGH (ref 0.1–1.0)
Monocytes Relative: 8 %
Neutro Abs: 11.7 10*3/uL — ABNORMAL HIGH (ref 1.7–7.7)
Neutrophils Relative %: 73 %
Platelets: 367 10*3/uL (ref 150–400)
RBC: 4.09 MIL/uL (ref 3.87–5.11)
RDW: 15.9 % — ABNORMAL HIGH (ref 11.5–15.5)
WBC: 16 10*3/uL — ABNORMAL HIGH (ref 4.0–10.5)
nRBC: 0 % (ref 0.0–0.2)

## 2019-07-15 LAB — LEGIONELLA PNEUMOPHILA SEROGP 1 UR AG: L. pneumophila Serogp 1 Ur Ag: NEGATIVE

## 2019-07-15 MED ORDER — DIPHENHYDRAMINE HCL 25 MG PO CAPS
25.0000 mg | ORAL_CAPSULE | Freq: Once | ORAL | Status: AC
  Administered 2019-07-15: 25 mg via ORAL
  Filled 2019-07-15: qty 1

## 2019-07-15 NOTE — Progress Notes (Signed)
PROGRESS NOTE    [name]  WUJ:[id number] DOB: 1961-03-07 DOA: 07/13/2019 PCP: [name], MD    Brief Narrative:  HPI per Dr. Max Fickle Hamada  is a 58 y.o. female, w Hypertension, Hyperlipidemia,  CAD s/p mid RCA stent 2008, h/o breast cancer s/p bilateral mastectomies s/p chemo/ Xrt, w Chronic lymphedema,  Thrombocytosis, apparently presents w c/o redness and pain left forearm x1 day.  Subjective fever.  Pt notes a scratch on her left hand about 1 week ago. Pt denies cough, cp, palp, sob, n/v, diarrhea, brbpr, black stool, dysuria, hematuria.   In ED,  T 98.5, P 97 R 24  Bp 132/98  Pox 98%  CXR IMPRESSION: Increasing left basilar infiltrate.  Na 138, K 3.4 Bun 14, Creatinine 0.99 Ast 21, Alt 20  Wbc 20.9, Hgb 13.5, Plt 430 Blood culture x2 pending  Pt given rocephin iv in Ed,   Pt will be admitted for cellulitis L forearm as well as ?CAP  Assessment & Plan:   Principal Problem:   Cellulitis Active Problems:   Hypertensive disorder   Dyslipidemia   Hypokalemia   Lymphedema of arm   Cancer of overlapping sites of right female breast ([place])   Breast cancer of upper-outer quadrant of left female breast ([place])   Cellulitis of left upper extremity   Cellulitis of left forearm  1 left upper extremity cellulitis Patient presented with 1 day history of fever, left upper extremity swelling, erythema, warmth and pain.  Plain films of the left upper extremity/forearm with mild soft tissue swelling without acute bony abnormality.  Blood cultures pending.  Upper extremity Dopplers negative for DVT.  Improving clinically.  Keep left upper extremity elevated.  Continue empiric IV vancomycin and IV Rocephin.  If continued improvement could likely transition to oral antibiotics tomorrow.  Follow.  2.  Hypokalemia Magnesium level at 1.9.  Potassium at 3.6.  Continue current regimen of potassium 20 mEq p.o. twice daily.   3.  Community-acquired pneumonia ruled  out Chest x-ray done on admission concerning for possible pneumonia.  CT chest which was done was negative for any acute consolidative process.  Urine strep antigen negative.  Urine pneumococcus antigen is negative.  Patient with no respiratory symptoms. Patient on empiric IV antibiotics secondary to problem #1.  Azithromycin has been discontinued.    4.  Hypertension Well-controlled on current regimen of Toprol-XL, Norvasc and Imdur.   5.  Hyperlipidemia Continue statin.  6.  Coronary artery disease status post mid RCA stent 2008 Currently stable.  High-sensitivity troponin obtained was negative.  Continue Toprol-XL, Norvasc, Imdur, aspirin.  7.  History of breast cancer status post bilateral mastectomies Status post chemo and radiation treatment with chronic lymphedema.  Continue tamoxifen.  Oncology notified of patient's admission via epic.  Outpatient follow-up with oncology.    DVT prophylaxis: Lovenox Code Status: Full Family Communication: Updated patient.  No family at bedside. Disposition Plan: Likely home when clinically improved and on oral antibiotics hopefully tomorrow..   Consultants:   Oncology notified via epic of patient's admission, Dr. [name]  Procedures:   CT chest 07/14/2019  Plain films of the left forearm 07/13/2019  Chest x-ray 07/13/2019  Bilateral lower extremity Doppler 07/14/2019--- negative for DVT        Bilateral upper extremity Dopplers 07/14/2019  Antimicrobials:   IV Rocephin 07/13/2019  IV vancomycin 07/13/2019  IV azithromycin 07/14/2019>>>> 07/14/2019   Subjective: Patient in bed with left upper extremity on pillows.  Feeling better than  on admission.  Denies any ongoing chest pain.  No abdominal pain.   Objective: Vitals:   07/14/19 0510 07/14/19 1312 07/14/19 2133 07/15/19 0532  BP: 122/64 121/74 124/78 118/73  Pulse: 85 81 79 76  Resp: 17 (!) 22 18 18   Temp: 99.4 F (37.4 C) 99.1 F (37.3 C) 99.7 F (37.6 C) 99.4 F (37.4 C)   TempSrc: Oral Oral Oral Oral  SpO2: 90% 95% 93% 90%  Weight:    117 kg  Height:        Intake/Output Summary (Last 24 hours) at 07/15/2019 1106 Last data filed at 07/15/2019 0900 Gross per 24 hour  Intake 830 ml  Output 1201 ml  Net -371 ml   Filed Weights   07/14/19 0504 07/15/19 0532  Weight: 99.5 kg 117 kg    Examination:  General exam: NAD Respiratory system: CTAB.  No wheezes, no crackles, no rhonchi.  Normal respiratory effort.  Cardiovascular system: Regular rate rhythm no murmurs rubs or gallops.  No JVD.  No lower extremity edema.  Gastrointestinal system: Abdomen is soft, nontender, nondistended, positive bowel sounds.  No rebound.  No guarding.  Central nervous system: Alert and oriented. No focal neurological deficits. Extremities: Left upper extremity with decreasing erythema, decreased warmth, decrease edema, less tender to palpation. Skin: No rashes, lesions or ulcers Psychiatry: Judgement and insight appear normal. Mood & affect appropriate.     Data Reviewed: I have personally reviewed following labs and imaging studies  CBC: Recent Labs  Lab 07/13/19 2256 07/14/19 0622 07/15/19 0615  WBC 20.9* 18.6* 16.0*  NEUTROABS 17.8*  --  11.7*  HGB 13.5 12.4 11.7*  HCT 41.7 38.5 37.3  MCV 89.1 89.1 91.2  PLT 430* 419* 833   Basic Metabolic Panel: Recent Labs  Lab 07/13/19 2256 07/14/19 0617 07/14/19 0622 07/14/19 2226 07/15/19 0615  NA 138  --  136  --  139  K 3.4*  --  3.3* 3.9 3.6  CL 105  --  108  --  112*  CO2 20*  --  21*  --  19*  GLUCOSE 103*  --  105*  --  96  BUN 14  --  14  --  11  CREATININE 0.99  --  0.93  --  0.86  CALCIUM 8.9  --  8.7*  --  8.3*  MG  --  1.9  --   --   --    GFR: Estimated Creatinine Clearance: 94.5 mL/min (by C-G formula based on SCr of 0.86 mg/dL). Liver Function Tests: Recent Labs  Lab 07/13/19 2256 07/14/19 0622  AST 21 20  ALT 20 18  ALKPHOS 89 82  BILITOT 0.8 0.6  PROT 7.1 6.5  ALBUMIN 3.7 3.3*    No results for input(s): LIPASE, AMYLASE in the last 168 hours. No results for input(s): AMMONIA in the last 168 hours. Coagulation Profile: Recent Labs  Lab 07/13/19 2256  INR 1.1   Cardiac Enzymes: No results for input(s): CKTOTAL, CKMB, CKMBINDEX, TROPONINI in the last 168 hours. BNP (last 3 results) No results for input(s): PROBNP in the last 8760 hours. HbA1C: No results for input(s): HGBA1C in the last 72 hours. CBG: No results for input(s): GLUCAP in the last 168 hours. Lipid Profile: No results for input(s): CHOL, HDL, LDLCALC, TRIG, CHOLHDL, LDLDIRECT in the last 72 hours. Thyroid Function Tests: No results for input(s): TSH, T4TOTAL, FREET4, T3FREE, THYROIDAB in the last 72 hours. Anemia Panel: No results for input(s): VITAMINB12, FOLATE, FERRITIN,  TIBC, IRON, RETICCTPCT in the last 72 hours. Sepsis Labs: Recent Labs  Lab 07/13/19 2256  LATICACIDVEN 1.3    Recent Results (from the past 240 hour(s))  Novel Coronavirus,NAA,(SEND-OUT TO REF LAB - TAT 24-48 hrs); Hosp Order     Status: None   Collection Time: 07/08/19  1:09 AM   Specimen: Nasopharyngeal Swab; Respiratory  Result Value Ref Range Status   SARS-CoV-2, NAA NOT DETECTED NOT DETECTED Final    Comment: (NOTE) This test was developed and its performance characteristics determined by [employer]. This test has not been FDA cleared or approved. This test has been authorized by FDA under an Emergency Use Authorization (EUA). This test is only authorized for the duration of time the declaration that circumstances exist justifying the authorization of the emergency use of in vitro diagnostic tests for detection of SARS-CoV-2 virus and/or diagnosis of COVID-19 infection under section 564(b)(1) of the Act, 21 U.S.C. 998PJA-2(N)(0), unless the authorization is terminated or revoked sooner. When diagnostic testing is negative, the possibility of a false negative result should be considered in the  context of a patient's recent exposures and the presence of clinical signs and symptoms consistent with COVID-19. An individual without symptoms of COVID-19 and who is not shedding SARS-CoV-2 virus would expect to have a negative (not detected) result in this assay. Performed  At: [facility] [place], [place] [id number] Rush Farmer MD PF:[id number]    [facility]  Final    Comment: Performed at [facility] [address] [place], [place] [address]  Blood Culture (routine x 2)     Status: None (Preliminary result)   Collection Time: 07/13/19 10:56 PM   Specimen: BLOOD  Result Value Ref Range Status   Specimen Description   Final    BLOOD RIGHT ANTECUBITAL Performed at [facility] [address] [place], [place] [address]    Special Requests   Final    BOTTLES DRAWN AEROBIC AND ANAEROBIC Blood Culture adequate volume Performed at [facility] [address], [place], [place] [address]    Culture   Final    NO GROWTH 1 DAY Performed at [facility], [place] [address] [place], [place] [address]    Report Status PENDING  Incomplete  Blood Culture (routine x 2)     Status: None (Preliminary result)   Collection Time: 07/13/19 10:56 PM   Specimen: BLOOD RIGHT HAND  Result Value Ref Range Status   Specimen Description   Final    BLOOD RIGHT HAND Performed at [facility], [place] [address] [place], [place] [address]    Special Requests   Final    BOTTLES DRAWN AEROBIC AND ANAEROBIC Blood Culture adequate volume Performed at [facility] [address], [place], [place] [address]    Culture   Final    NO GROWTH 1 DAY Performed at [facility], [place] [address], [name], [place] [address]    Report Status PENDING  Incomplete  SARS Coronavirus 2 (CEPHEID - Performed in [facility] hospital lab), Hosp Order     Status: None   Collection  Time: 07/13/19 10:56 PM   Specimen: Nasopharyngeal Swab  Result Value Ref Range Status   SARS Coronavirus 2 NEGATIVE NEGATIVE Final    Comment: (NOTE) If result is NEGATIVE SARS-CoV-2 target nucleic acids are NOT DETECTED. The SARS-CoV-2 RNA is generally detectable in upper and lower  respiratory specimens during the  acute phase of infection. The lowest  concentration of SARS-CoV-2 viral copies this assay can detect is 250  copies / mL. A negative result does not preclude SARS-CoV-2 infection  and should not be used as the sole basis for treatment or other  patient management decisions.  A negative result may occur with  improper specimen collection / handling, submission of specimen other  than nasopharyngeal swab, presence of viral mutation(s) within the  areas targeted by this assay, and inadequate number of viral copies  (<250 copies / mL). A negative result must be combined with clinical  observations, patient history, and epidemiological information. If result is POSITIVE SARS-CoV-2 target nucleic acids are DETECTED. The SARS-CoV-2 RNA is generally detectable in upper and lower  respiratory specimens dur ing the acute phase of infection.  Positive  results are indicative of active infection with SARS-CoV-2.  Clinical  correlation with patient history and other diagnostic information is  necessary to determine patient infection status.  Positive results do  not rule out bacterial infection or co-infection with other viruses. If result is PRESUMPTIVE POSTIVE SARS-CoV-2 nucleic acids MAY BE PRESENT.   A presumptive positive result was obtained on the submitted specimen  and confirmed on repeat testing.  While 2019 novel coronavirus  (SARS-CoV-2) nucleic acids may be present in the submitted sample  additional confirmatory testing may be necessary for epidemiological  and / or clinical management purposes  to differentiate between  SARS-CoV-2 and other Sarbecovirus currently known  to infect humans.  If clinically indicated additional testing with an alternate test  methodology [phone]) is advised. The SARS-CoV-2 RNA is generally  detectable in upper and lower respiratory sp ecimens during the acute  phase of infection. The expected result is Negative. Fact Sheet for Patients:  StrictlyIdeas.no Fact Sheet for Healthcare Providers: BankingDealers.co.za This test is not yet approved or cleared by the [place] FDA and has been authorized for detection and/or diagnosis of SARS-CoV-2 by FDA under an Emergency Use Authorization (EUA).  This EUA will remain in effect (meaning this test can be used) for the duration of the COVID-19 declaration under Section 564(b)(1) of the Act, 21 U.S.C. section 360bbb-3(b)(1), unless the authorization is terminated or revoked sooner. Performed at [facility], [place] [address], [place], [place] [address]          Radiology Studies: Dg Forearm Left  Result Date: 07/13/2019 CLINICAL DATA:  Fever and left arm swelling EXAM: LEFT FOREARM - 2 VIEW COMPARISON:  None. FINDINGS: No acute fracture or dislocation is noted. Generalized soft tissue swelling is noted. No foreign body is seen. IMPRESSION: Mild soft tissue swelling without acute bony abnormality. Electronically Signed   By: Inez Catalina M.D.   On: 07/13/2019 23:24   Ct Chest Wo Contrast  Result Date: 07/14/2019 CLINICAL DATA:  58 year old female with history of pneumonia. Shortness of breath. Evaluate for effusion. EXAM: CT CHEST WITHOUT CONTRAST TECHNIQUE: Multidetector CT imaging of the chest was performed following the standard protocol without IV contrast. COMPARISON:  Chest CT 11/17/2015. FINDINGS: Cardiovascular: Heart size is normal. There is no significant pericardial fluid, thickening or pericardial calcification. There is aortic atherosclerosis, as well as atherosclerosis of the great vessels of the  mediastinum and the coronary arteries, including calcified atherosclerotic plaque in the left main, left anterior descending, left circumflex and right coronary arteries. Mediastinum/Nodes: No pathologically enlarged mediastinal or hilar lymph nodes. Please note that accurate exclusion of hilar adenopathy is limited on noncontrast CT scans. Esophagus is unremarkable  in appearance. No axillary lymphadenopathy. Surgical clips in the left axilla from prior lymph node dissection. Lungs/Pleura: Dependent areas of scarring are noted in the lower lobes of the lungs bilaterally, similar to the prior study. No acute consolidative airspace disease. No pleural effusions. No suspicious appearing pulmonary nodules or masses are noted. Mild diffuse bronchial wall thickening with mild centrilobular and paraseptal emphysema. Upper Abdomen: Aortic atherosclerosis. Mild diffuse low attenuation throughout the visualized hepatic parenchyma, indicative of a background of hepatic steatosis. Musculoskeletal: Status post bilateral modified radical mastectomy and left axillary lymph node dissection. There are no aggressive appearing lytic or blastic lesions noted in the visualized portions of the skeleton. IMPRESSION: 1. No pleural effusions. 2. No acute findings in the thorax. 3. Mild diffuse bronchial wall thickening with mild centrilobular and paraseptal emphysema; imaging findings suggestive of underlying COPD. 4. Mild hepatic steatosis. 5. Aortic atherosclerosis, in addition to left main and 3 vessel coronary artery disease. Please note that although the presence of coronary artery calcium documents the presence of coronary artery disease, the severity of this disease and any potential stenosis cannot be assessed on this non-gated CT examination. Assessment for potential risk factor modification, dietary therapy or pharmacologic therapy may be warranted, if clinically indicated. Aortic Atherosclerosis (ICD10-I70.0) and Emphysema  (ICD10-J43.9). Electronically Signed   By: Vinnie Langton M.D.   On: 07/14/2019 06:58   Dg Chest Port 1 View  Result Date: 07/13/2019 CLINICAL DATA:  Right-sided chest pain EXAM: PORTABLE CHEST 1 VIEW COMPARISON:  07/08/2019 FINDINGS: Cardiac shadow is stable. Aortic calcifications are again seen. Lungs are well aerated although increasing basilar infiltrate on the left is noted. No bony abnormality is noted. IMPRESSION: Increasing left basilar infiltrate. Electronically Signed   By: Inez Catalina M.D.   On: 07/13/2019 23:22   Vas [name] Lower Extremity Venous (dvt)  Result Date: 07/14/2019  Lower Venous Study Indications: Swelling.  Performing Technologist: Maudry Mayhew MHA, RDMS, RVT, RDCS  Examination Guidelines: A complete evaluation includes B-mode imaging, spectral Doppler, color Doppler, and power Doppler as needed of all accessible portions of each vessel. Bilateral testing is considered an integral part of a complete examination. Limited examinations for reoccurring indications may be performed as noted.  +---------+---------------+---------+-----------+----------+-------+  RIGHT     Compressibility Phasicity Spontaneity Properties Summary  +---------+---------------+---------+-----------+----------+-------+  CFV       Full            Yes       Yes                             +---------+---------------+---------+-----------+----------+-------+  SFJ       Full                                                      +---------+---------------+---------+-----------+----------+-------+  FV Prox   Full                                                      +---------+---------------+---------+-----------+----------+-------+  FV Mid    Full                                                      +---------+---------------+---------+-----------+----------+-------+  FV Distal Full                                                      +---------+---------------+---------+-----------+----------+-------+  PFV        Full                                                      +---------+---------------+---------+-----------+----------+-------+  POP       Full            Yes       Yes                             +---------+---------------+---------+-----------+----------+-------+  PTV       Full                                                      +---------+---------------+---------+-----------+----------+-------+  PERO      Full                                                      +---------+---------------+---------+-----------+----------+-------+   +---------+---------------+---------+-----------+----------+-------+  LEFT      Compressibility Phasicity Spontaneity Properties Summary  +---------+---------------+---------+-----------+----------+-------+  CFV       Full            Yes       Yes                             +---------+---------------+---------+-----------+----------+-------+  SFJ       Full                                                      +---------+---------------+---------+-----------+----------+-------+  FV Prox   Full                                                      +---------+---------------+---------+-----------+----------+-------+  FV Mid    Full                                                      +---------+---------------+---------+-----------+----------+-------+  FV Distal Full                                                      +---------+---------------+---------+-----------+----------+-------+  PFV       Full                                                      +---------+---------------+---------+-----------+----------+-------+  POP       Full            Yes       Yes                             +---------+---------------+---------+-----------+----------+-------+  PTV       Full                                                      +---------+---------------+---------+-----------+----------+-------+  PERO      Full                                                       +---------+---------------+---------+-----------+----------+-------+     Summary: Right: There is no evidence of deep vein thrombosis in the lower extremity. No cystic structure found in the popliteal fossa. Left: There is no evidence of deep vein thrombosis in the lower extremity. No cystic structure found in the popliteal fossa.  *See table(s) above for measurements and observations. Electronically signed by Deitra Mayo MD on 07/14/2019 at 9:54:42 AM.    Final    Vas [name] Upper Extremity Venous Duplex  Result Date: 07/14/2019 UPPER VENOUS STUDY  Indications: Swelling, and left upper extremity lymphedema Performing Technologist: Maudry Mayhew MHA, RDMS, RVT, RDCS  Examination Guidelines: A complete evaluation includes B-mode imaging, spectral Doppler, color Doppler, and power Doppler as needed of all accessible portions of each vessel. Bilateral testing is considered an integral part of a complete examination. Limited examinations for reoccurring indications may be performed as noted.  Right Findings: +----------+------------+---------+-----------+----------+-------+  RIGHT      Compressible Phasicity Spontaneous Properties Summary  +----------+------------+---------+-----------+----------+-------+  Subclavian                 Yes        Yes                         +----------+------------+---------+-----------+----------+-------+  Left Findings: +----------+------------+---------+-----------+----------+-------+  LEFT       Compressible Phasicity Spontaneous Properties Summary  +----------+------------+---------+-----------+----------+-------+  IJV            Full        Yes        Yes                         +----------+------------+---------+-----------+----------+-------+  Subclavian     Full        Yes        Yes                         +----------+------------+---------+-----------+----------+-------+  Axillary       Full  Yes        Yes                          +----------+------------+---------+-----------+----------+-------+  Brachial       Full        Yes        Yes                         +----------+------------+---------+-----------+----------+-------+  Radial         Full                                               +----------+------------+---------+-----------+----------+-------+  Ulnar          Full                                               +----------+------------+---------+-----------+----------+-------+  Cephalic       Full                                               +----------+------------+---------+-----------+----------+-------+  Basilic        Full                                               +----------+------------+---------+-----------+----------+-------+  Summary:  Right: No evidence of thrombosis in the subclavian.  Left: No evidence of deep vein thrombosis in the upper extremity. No evidence of superficial vein thrombosis in the upper extremity.  *See table(s) above for measurements and observations.  Diagnosing physician: Deitra Mayo MD Electronically signed by Deitra Mayo MD on 07/14/2019 at 9:54:57 AM.    Final         Scheduled Meds:  allopurinol  300 mg Oral Daily   amLODipine  10 mg Oral Daily   aspirin EC  81 mg Oral QHS   atorvastatin  40 mg Oral q1800   enoxaparin (LOVENOX) injection  40 mg Subcutaneous Daily   isosorbide mononitrate  60 mg Oral QHS   metoprolol succinate  100 mg Oral Daily   potassium chloride SA  20 mEq Oral BID   sodium chloride flush  3 mL Intravenous Q12H   tamoxifen  20 mg Oral QHS   [START ON 07/19/2019] Vitamin D (Ergocalciferol)  50,000 Units Oral Q7 days   Continuous Infusions:  sodium chloride     cefTRIAXone (ROCEPHIN)  IV 2 g (07/14/19 2300)   vancomycin 1,250 mg (07/15/19 0238)     LOS: 1 day    Time spent: 35 minutes    Irine Seal, MD [facility]  If 7PM-7AM, please contact night-coverage [email or web address] 07/15/2019, 11:06 AM

## 2019-07-16 LAB — CBC WITH DIFFERENTIAL/PLATELET
Abs Immature Granulocytes: 0.19 10*3/uL — ABNORMAL HIGH (ref 0.00–0.07)
Basophils Absolute: 0.1 10*3/uL (ref 0.0–0.1)
Basophils Relative: 1 %
Eosinophils Absolute: 0.2 10*3/uL (ref 0.0–0.5)
Eosinophils Relative: 2 %
HCT: 37.1 % (ref 36.0–46.0)
Hemoglobin: 11.8 g/dL — ABNORMAL LOW (ref 12.0–15.0)
Immature Granulocytes: 2 %
Lymphocytes Relative: 19 %
Lymphs Abs: 2.4 10*3/uL (ref 0.7–4.0)
MCH: 28.3 pg (ref 26.0–34.0)
MCHC: 31.8 g/dL (ref 30.0–36.0)
MCV: 89 fL (ref 80.0–100.0)
Monocytes Absolute: 1 10*3/uL (ref 0.1–1.0)
Monocytes Relative: 7 %
Neutro Abs: 9.1 10*3/uL — ABNORMAL HIGH (ref 1.7–7.7)
Neutrophils Relative %: 69 %
Platelets: 397 10*3/uL (ref 150–400)
RBC: 4.17 MIL/uL (ref 3.87–5.11)
RDW: 15.6 % — ABNORMAL HIGH (ref 11.5–15.5)
WBC: 12.9 10*3/uL — ABNORMAL HIGH (ref 4.0–10.5)
nRBC: 0 % (ref 0.0–0.2)

## 2019-07-16 LAB — BASIC METABOLIC PANEL
Anion gap: 10 (ref 5–15)
BUN: 9 mg/dL (ref 6–20)
CO2: 22 mmol/L (ref 22–32)
Calcium: 8.3 mg/dL — ABNORMAL LOW (ref 8.9–10.3)
Chloride: 108 mmol/L (ref 98–111)
Creatinine, Ser: 0.76 mg/dL (ref 0.44–1.00)
GFR calc Af Amer: >60 mL/min (ref >60)
GFR calc non Af Amer: >60 mL/min (ref >60)
Glucose, Bld: 98 mg/dL (ref 70–99)
Potassium: 3.6 mmol/L (ref 3.5–5.1)
Sodium: 140 mmol/L (ref 135–145)

## 2019-07-16 LAB — MAGNESIUM: Magnesium: 2 mg/dL (ref 1.7–2.4)

## 2019-07-16 MED ORDER — HYDROXYZINE HCL 25 MG PO TABS: 25.0000 mg | ORAL_TABLET | Freq: Three times a day (TID) | ORAL | Status: DC | PRN

## 2019-07-16 MED ORDER — CAMPHOR-MENTHOL 0.5-0.5 % EX LOTN
TOPICAL_LOTION | CUTANEOUS | Status: DC | PRN
  Filled 2019-07-16: qty 222

## 2019-07-16 MED ORDER — CEPHALEXIN 500 MG PO CAPS
500.0000 mg | ORAL_CAPSULE | Freq: Four times a day (QID) | ORAL | Status: DC
  Administered 2019-07-16 – 2019-07-17 (×4): 500 mg via ORAL
  Filled 2019-07-16 (×4): qty 1

## 2019-07-16 NOTE — Progress Notes (Signed)
PROGRESS NOTE    [name]  AVW:[id number] DOB: 1961-11-18 DOA: 07/13/2019 PCP: [name], MD    Brief Narrative:  HPI per Dr. Max Fickle Puga  is a 58 y.o. female, w Hypertension, Hyperlipidemia,  CAD s/p mid RCA stent 2008, h/o breast cancer s/p bilateral mastectomies s/p chemo/ Xrt, w Chronic lymphedema,  Thrombocytosis, apparently presents w c/o redness and pain left forearm x1 day.  Subjective fever.  Pt notes a scratch on her left hand about 1 week ago. Pt denies cough, cp, palp, sob, n/v, diarrhea, brbpr, black stool, dysuria, hematuria.   In ED,  T 98.5, P 97 R 24  Bp 132/98  Pox 98%  CXR IMPRESSION: Increasing left basilar infiltrate.  Na 138, K 3.4 Bun 14, Creatinine 0.99 Ast 21, Alt 20  Wbc 20.9, Hgb 13.5, Plt 430 Blood culture x2 pending  Pt given rocephin iv in Ed,   Pt will be admitted for cellulitis L forearm as well as ?CAP  Assessment & Plan:   Principal Problem:   Cellulitis Active Problems:   Hypertensive disorder   Dyslipidemia   Hypokalemia   Lymphedema of arm   Cancer of overlapping sites of right female breast ([place])   Breast cancer of upper-outer quadrant of left female breast ([place])   Cellulitis of left upper extremity   Cellulitis of left forearm  1 left upper extremity cellulitis Patient presented with 1 day history of fever, left upper extremity swelling, erythema, warmth and pain.  Plain films of the left upper extremity/forearm with mild soft tissue swelling without acute bony abnormality.  Blood cultures pending with no growth to date.Marland Kitchen  Upper extremity Dopplers negative for DVT.  Improving clinically.  Keep left upper extremity elevated.  Discontinue IV vancomycin.  Discontinue IV Rocephin and transition to oral Keflex.  Keep left upper extremity elevated.  Follow.  2.  Hypokalemia Magnesium level at 2.  Potassium at 3.6.  Continue current regimen of potassium 20 mEq p.o. twice daily.   3.  Community-acquired  pneumonia ruled out Chest x-ray done on admission concerning for possible pneumonia.  CT chest which was done was negative for any acute consolidative process.  Urine strep antigen negative.  Urine pneumococcus antigen is negative.  Patient with no respiratory symptoms. Patient on empiric IV antibiotics secondary to problem #1.  Azithromycin has been discontinued.    4.  Hypertension Well-controlled on current regimen of Toprol-XL, Norvasc and Imdur.   5.  Hyperlipidemia Statin.    6.  Coronary artery disease status post mid RCA stent 2008 Currently stable.  High-sensitivity troponin obtained was negative.  Continue home regimen of Toprol-XL, Norvasc, Imdur, aspirin.  7.  History of breast cancer status post bilateral mastectomies Status post chemo and radiation treatment with chronic lymphedema.  Continue tamoxifen.  Oncology notified of patient's admission via epic.  Outpatient follow-up with oncology.    DVT prophylaxis: Lovenox Code Status: Full Family Communication: Updated patient.  No family at bedside. Disposition Plan: Likely home when clinically improved and on oral antibiotics hopefully tomorrow..   Consultants:   Oncology notified via epic of patient's admission, Dr. [name]  Procedures:   CT chest 07/14/2019  Plain films of the left forearm 07/13/2019  Chest x-ray 07/13/2019  Bilateral lower extremity Doppler 07/14/2019--- negative for DVT        Bilateral upper extremity Dopplers 07/14/2019  Antimicrobials:   IV Rocephin 07/13/2019 >>>>>> 07/16/2019  IV vancomycin 07/13/2019 >>>>> 07/16/2019  IV azithromycin 07/14/2019>>>> 07/14/2019  Keflex 07/16/2019  Subjective: Patient with complaints of left upper extremity pain.  Feels left upper extremity swelling and redness improving.   Objective: Vitals:   07/15/19 0532 07/15/19 1409 07/15/19 2144 07/16/19 0423  BP: 118/73 (!) 115/55 (!) 141/90 112/66  Pulse: 76 78 76 72  Resp: 18 20 18 18   Temp: 99.4 F (37.4  C) 98.6 F (37 C) 98.8 F (37.1 C) 98.8 F (37.1 C)  TempSrc: Oral Oral Oral Oral  SpO2: 90% 97% 95% 92%  Weight: 117 kg   116.3 kg  Height:        Intake/Output Summary (Last 24 hours) at 07/16/2019 1229 Last data filed at 07/16/2019 1200 Gross per 24 hour  Intake 1420 ml  Output -  Net 1420 ml   Filed Weights   07/14/19 0504 07/15/19 0532 07/16/19 0423  Weight: 99.5 kg 117 kg 116.3 kg    Examination:  General exam: NAD Respiratory system: Lungs clear to auscultation bilaterally.  No wheezes, no crackles, no rhonchi.  Normal respiratory effort.  Cardiovascular system: RRR no murmurs rubs or gallops.  No JVD.  No lower extremity edema.  Gastrointestinal system: Abdomen is nontender, nondistended, soft, positive bowel sounds.  No rebound.  No guarding.  Central nervous system: Alert and oriented. No focal neurological deficits. Extremities: Left upper extremity with decreasing erythema, decreased warmth, decrease edema, less tender to palpation. Skin: No rashes, lesions or ulcers Psychiatry: Judgement and insight appear normal. Mood & affect appropriate.     Data Reviewed: I have personally reviewed following labs and imaging studies  CBC: Recent Labs  Lab 07/13/19 2256 07/14/19 0622 07/15/19 0615 07/16/19 0604  WBC 20.9* 18.6* 16.0* 12.9*  NEUTROABS 17.8*  --  11.7* 9.1*  HGB 13.5 12.4 11.7* 11.8*  HCT 41.7 38.5 37.3 37.1  MCV 89.1 89.1 91.2 89.0  PLT 430* 419* 367 631   Basic Metabolic Panel: Recent Labs  Lab 07/13/19 2256 07/14/19 0617 07/14/19 0622 07/14/19 2226 07/15/19 0615 07/16/19 0604  NA 138  --  136  --  139 140  K 3.4*  --  3.3* 3.9 3.6 3.6  CL 105  --  108  --  112* 108  CO2 20*  --  21*  --  19* 22  GLUCOSE 103*  --  105*  --  96 98  BUN 14  --  14  --  11 9  CREATININE 0.99  --  0.93  --  0.86 0.76  CALCIUM 8.9  --  8.7*  --  8.3* 8.3*  MG  --  1.9  --   --   --  2.0   GFR: Estimated Creatinine Clearance: 101.2 mL/min (by C-G formula  based on SCr of 0.76 mg/dL). Liver Function Tests: Recent Labs  Lab 07/13/19 2256 07/14/19 0622  AST 21 20  ALT 20 18  ALKPHOS 89 82  BILITOT 0.8 0.6  PROT 7.1 6.5  ALBUMIN 3.7 3.3*   No results for input(s): LIPASE, AMYLASE in the last 168 hours. No results for input(s): AMMONIA in the last 168 hours. Coagulation Profile: Recent Labs  Lab 07/13/19 2256  INR 1.1   Cardiac Enzymes: No results for input(s): CKTOTAL, CKMB, CKMBINDEX, TROPONINI in the last 168 hours. BNP (last 3 results) No results for input(s): PROBNP in the last 8760 hours. HbA1C: No results for input(s): HGBA1C in the last 72 hours. CBG: No results for input(s): GLUCAP in the last 168 hours. Lipid Profile: No results for input(s): CHOL, HDL, LDLCALC, TRIG, CHOLHDL,  LDLDIRECT in the last 72 hours. Thyroid Function Tests: No results for input(s): TSH, T4TOTAL, FREET4, T3FREE, THYROIDAB in the last 72 hours. Anemia Panel: No results for input(s): VITAMINB12, FOLATE, FERRITIN, TIBC, IRON, RETICCTPCT in the last 72 hours. Sepsis Labs: Recent Labs  Lab 07/13/19 2256  LATICACIDVEN 1.3    Recent Results (from the past 240 hour(s))  Novel Coronavirus,NAA,(SEND-OUT TO REF LAB - TAT 24-48 hrs); Hosp Order     Status: None   Collection Time: 07/08/19  1:09 AM   Specimen: Nasopharyngeal Swab; Respiratory  Result Value Ref Range Status   SARS-CoV-2, NAA NOT DETECTED NOT DETECTED Final    Comment: (NOTE) This test was developed and its performance characteristics determined by [employer]. This test has not been FDA cleared or approved. This test has been authorized by FDA under an Emergency Use Authorization (EUA). This test is only authorized for the duration of time the declaration that circumstances exist justifying the authorization of the emergency use of in vitro diagnostic tests for detection of SARS-CoV-2 virus and/or diagnosis of COVID-19 infection under section 564(b)(1) of the Act, 21  U.S.C. 295MWU-1(L)(2), unless the authorization is terminated or revoked sooner. When diagnostic testing is negative, the possibility of a false negative result should be considered in the context of a patient's recent exposures and the presence of clinical signs and symptoms consistent with COVID-19. An individual without symptoms of COVID-19 and who is not shedding SARS-CoV-2 virus would expect to have a negative (not detected) result in this assay. Performed  At: [facility] [place], [place] [id number] Rush Farmer MD DG:[id number]    [place]  Final    Comment: Performed at [place] [facility] [address] [place], [place] [address]  Blood Culture (routine x 2)     Status: None (Preliminary result)   Collection Time: 07/13/19 10:56 PM   Specimen: BLOOD  Result Value Ref Range Status   Specimen Description   Final    BLOOD RIGHT ANTECUBITAL Performed at [facility] [name], [place], [place] [address]    Special Requests   Final    BOTTLES DRAWN AEROBIC AND ANAEROBIC Blood Culture adequate volume Performed at [facility] [address] [place], [place] [address]    Culture   Final    NO GROWTH 2 DAYS Performed at [place] [address], [place], [place] [address]    Report Status PENDING  Incomplete  Blood Culture (routine x 2)     Status: None (Preliminary result)   Collection Time: 07/13/19 10:56 PM   Specimen: BLOOD RIGHT HAND  Result Value Ref Range Status   Specimen Description   Final    BLOOD RIGHT HAND Performed at [facility], [place] [address], [place], [place] [address]    Special Requests   Final    BOTTLES DRAWN AEROBIC AND ANAEROBIC Blood Culture adequate volume Performed at [facility] [place] [address], [place], [place] [address]    Culture   Final    NO GROWTH 2 DAYS Performed at [place] [place] [address], [place], [place] [address]    Report Status PENDING  Incomplete  SARS Coronavirus 2 (CEPHEID - Performed in [place] hospital lab), Hosp Order     Status: None   Collection Time: 07/13/19 10:56 PM   Specimen: Nasopharyngeal Swab  Result Value Ref Range Status   SARS Coronavirus 2 NEGATIVE NEGATIVE  Final    Comment: (NOTE) If result is NEGATIVE SARS-CoV-2 target nucleic acids are NOT DETECTED. The SARS-CoV-2 RNA is generally detectable in upper and lower  respiratory specimens during the acute phase of infection. The lowest  concentration of SARS-CoV-2 viral copies this assay can detect is 250  copies / mL. A negative result does not preclude SARS-CoV-2 infection  and should not be used as the sole basis for treatment or other  patient management decisions.  A negative result may occur with  improper specimen collection / handling, submission of specimen other  than nasopharyngeal swab, presence of viral mutation(s) within the  areas targeted by this assay, and inadequate number of viral copies  (<250 copies / mL). A negative result must be combined with clinical  observations, patient history, and epidemiological information. If result is POSITIVE SARS-CoV-2 target nucleic acids are DETECTED. The SARS-CoV-2 RNA is generally detectable in upper and lower  respiratory specimens dur ing the acute phase of infection.  Positive  results are indicative of active infection with SARS-CoV-2.  Clinical  correlation with patient history and other diagnostic information is  necessary to determine patient infection status.  Positive results do  not rule out bacterial infection or co-infection with other viruses. If result is PRESUMPTIVE POSTIVE SARS-CoV-2 nucleic acids MAY BE PRESENT.   A presumptive positive result was obtained on the submitted specimen  and confirmed on repeat testing.  While 2019 novel coronavirus  (SARS-CoV-2) nucleic acids may be present in the submitted  sample  additional confirmatory testing may be necessary for epidemiological  and / or clinical management purposes  to differentiate between  SARS-CoV-2 and other Sarbecovirus currently known to infect humans.  If clinically indicated additional testing with an alternate test  methodology [id number]) is advised. The SARS-CoV-2 RNA is generally  detectable in upper and lower respiratory sp ecimens during the acute  phase of infection. The expected result is Negative. Fact Sheet for Patients:  StrictlyIdeas.no Fact Sheet for Healthcare Providers: BankingDealers.co.za This test is not yet approved or cleared by the [place] FDA and has been authorized for detection and/or diagnosis of SARS-CoV-2 by FDA under an Emergency Use Authorization (EUA).  This EUA will remain in effect (meaning this test can be used) for the duration of the COVID-19 declaration under Section 564(b)(1) of the Act, 21 U.S.C. section 360bbb-3(b)(1), unless the authorization is terminated or revoked sooner. Performed at [facility], [place] [address], [place], [place] [address]          Radiology Studies: No results found.      Scheduled Meds: . allopurinol  300 mg Oral Daily  . amLODipine  10 mg Oral Daily  . aspirin EC  81 mg Oral QHS  . atorvastatin  40 mg Oral q1800  . enoxaparin (LOVENOX) injection  40 mg Subcutaneous Daily  . isosorbide mononitrate  60 mg Oral QHS  . metoprolol succinate  100 mg Oral Daily  . potassium chloride SA  20 mEq Oral BID  . sodium chloride flush  3 mL Intravenous Q12H  . tamoxifen  20 mg Oral QHS  . [START ON 07/19/2019] Vitamin D (Ergocalciferol)  50,000 Units Oral Q7 days   Continuous Infusions: . sodium chloride    . cefTRIAXone (ROCEPHIN)  IV 2 g (07/15/19 2159)     LOS: 2 days    Time spent: 35 minutes    Irine Seal, MD [facility]  If 7PM-7AM, please contact  night-coverage [email or web address] 07/16/2019, 12:29 PM

## 2019-07-17 LAB — BASIC METABOLIC PANEL
Anion gap: 10 (ref 5–15)
BUN: 11 mg/dL (ref 6–20)
CO2: 23 mmol/L (ref 22–32)
Calcium: 8.8 mg/dL — ABNORMAL LOW (ref 8.9–10.3)
Chloride: 107 mmol/L (ref 98–111)
Creatinine, Ser: 0.82 mg/dL (ref 0.44–1.00)
GFR calc Af Amer: >60 mL/min (ref >60)
GFR calc non Af Amer: >60 mL/min (ref >60)
Glucose, Bld: 96 mg/dL (ref 70–99)
Potassium: 3.8 mmol/L (ref 3.5–5.1)
Sodium: 140 mmol/L (ref 135–145)

## 2019-07-17 LAB — CBC WITH DIFFERENTIAL/PLATELET
Abs Immature Granulocytes: 0.22 10*3/uL — ABNORMAL HIGH (ref 0.00–0.07)
Basophils Absolute: 0.1 10*3/uL (ref 0.0–0.1)
Basophils Relative: 1 %
Eosinophils Absolute: 0.2 10*3/uL (ref 0.0–0.5)
Eosinophils Relative: 2 %
HCT: 39.3 % (ref 36.0–46.0)
Hemoglobin: 12.7 g/dL (ref 12.0–15.0)
Immature Granulocytes: 2 %
Lymphocytes Relative: 22 %
Lymphs Abs: 2.4 10*3/uL (ref 0.7–4.0)
MCH: 28.7 pg (ref 26.0–34.0)
MCHC: 32.3 g/dL (ref 30.0–36.0)
MCV: 88.9 fL (ref 80.0–100.0)
Monocytes Absolute: 0.7 10*3/uL (ref 0.1–1.0)
Monocytes Relative: 7 %
Neutro Abs: 7 10*3/uL (ref 1.7–7.7)
Neutrophils Relative %: 66 %
Platelets: 434 10*3/uL — ABNORMAL HIGH (ref 150–400)
RBC: 4.42 MIL/uL (ref 3.87–5.11)
RDW: 15.3 % (ref 11.5–15.5)
WBC: 10.5 10*3/uL (ref 4.0–10.5)
nRBC: 0 % (ref 0.0–0.2)

## 2019-07-17 MED ORDER — CEPHALEXIN 500 MG PO CAPS: 500.0000 mg | ORAL_CAPSULE | Freq: Four times a day (QID) | ORAL | 0 refills | Status: AC

## 2019-07-17 MED ORDER — CAMPHOR-MENTHOL 0.5-0.5 % EX LOTN: TOPICAL_LOTION | CUTANEOUS | 0 refills | Status: DC | PRN

## 2019-07-17 NOTE — Discharge Summary (Signed)
Physician Discharge Summary  JACQULIN BRANDENBURGER GYJ:[id number] DOB: 09-21-1961 DOA: 07/13/2019  PCP: [name], MD  Admit date: 07/13/2019 Discharge date: 07/17/2019  Time spent: 50 minutes  Recommendations for Outpatient Follow-up:  1. Follow-up with [name], MD in 1 to 2 weeks.  On follow-up patient's left upper extremity cellulitis will need to be reassessed.   Discharge Diagnoses:  Principal Problem:   Cellulitis Active Problems:   Hypertensive disorder   Dyslipidemia   Hypokalemia   Lymphedema of arm   Cancer of overlapping sites of right female breast ([place])   Breast cancer of upper-outer quadrant of left female breast ([place])   Cellulitis of left upper extremity   Cellulitis of left forearm   Discharge Condition: Stable and improved  Diet recommendation: Regular  Filed Weights   07/15/19 0532 07/16/19 0423 07/17/19 0538  Weight: 117 kg 116.3 kg 113.7 kg    History of present illness:  Per Dr. Opal Sidles  is a 58 y.o. female, w Hypertension, Hyperlipidemia,  CAD s/p mid RCA stent 2008, h/o breast cancer s/p bilateral mastectomies s/p chemo/ Xrt, w Chronic lymphedema,  Thrombocytosis, apparently presented w c/o redness and pain left forearm x1 day.  Subjective fever.  Pt noted a scratch on her left hand about 1 week ago. Pt denied cough, cp, palp, sob, n/v, diarrhea, brbpr, black stool, dysuria, hematuria.   In ED,  T 98.5, P 97 R 24  Bp 132/98  Pox 98%  CXR IMPRESSION: Increasing left basilar infiltrate.  Na 138, K 3.4 Bun 14, Creatinine 0.99 Ast 21, Alt 20  Wbc 20.9, Hgb 13.5, Plt 430 Blood culture x2 pending  Pt given rocephin iv in Ed,   Pt will be admitted for cellulitis L forearm as well as ?CAP  Hospital Course:  1 left upper extremity cellulitis Patient presented with 1 day history of fever, left upper extremity swelling, erythema, warmth and pain.  Plain films of the left upper extremity/forearm with mild soft tissue  swelling without acute bony abnormality.  Blood cultures pending with no growth to date.Marland Kitchen  Upper extremity Dopplers negative for DVT.    Patient initially placed empirically on IV Rocephin and IV vancomycin.  Patient improved clinically.  IV vancomycin was subsequently discontinued.  Patient subsequently transition to oral Keflex which she tolerated.  Patient will be discharged home on 4 more days of oral Keflex to complete a 7-day course of antibiotic treatment.  Outpatient follow-up with PCP.   2.  Hypokalemia Magnesium level at 2.    Potassium was repleted.  Outpatient follow-up.    3.  Community-acquired pneumonia ruled out Chest x-ray done on admission concerning for possible pneumonia.  CT chest which was done was negative for any acute consolidative process.  Urine strep antigen negative.  Urine pneumococcus antigen is negative.  Patient with no respiratory symptoms. Patient was on empiric IV antibiotics secondary to problem #1.  Azithromycin was initially started however discontinued.  Patient had no respiratory symptoms.  Outpatient follow-up.    4.  Hypertension Well-controlled on home regimen of Toprol-XL, Norvasc and Imdur.   5.  Hyperlipidemia Patient maintained on a statin.    6.  Coronary artery disease status post mid RCA stent 2008 Remained stable.  High-sensitivity troponin obtained was negative.  Continued on home regimen of Toprol-XL, Norvasc, Imdur, aspirin.  7.  History of breast cancer status post bilateral mastectomies Status post chemo and radiation treatment with chronic lymphedema.  Patient maintained on home regimen of  tamoxifen.  Outpatient follow-up with oncology.   Procedures:  CT chest 07/14/2019  Plain films of the left forearm 07/13/2019  Chest x-ray 07/13/2019  Bilateral lower extremity Doppler 07/14/2019--- negative for DVT        Bilateral upper extremity Dopplers 07/14/2019  Consultations:  None  Discharge Exam: Vitals:   07/16/19 1943  07/17/19 0538  BP: 126/79 116/72  Pulse: 69 65  Resp: 17 17  Temp: 98.8 F (37.1 C) 98.4 F (36.9 C)  SpO2: 96% 92%    General: NAD Cardiovascular: RRR Respiratory: CTAB Extremities: Left upper extremity leg swelling, decreased erythema, decreased tenderness to palpation.  Discharge Instructions   Discharge Instructions    Diet general   Complete by: As directed    Increase activity slowly   Complete by: As directed      Allergies as of 07/17/2019   No Known Allergies     Medication List    TAKE these medications   acetaminophen 500 MG tablet Commonly known as: TYLENOL Take 2 tablets (1,000 mg total) by mouth every 8 (eight) hours as needed.   allopurinol 300 MG tablet Commonly known as: ZYLOPRIM TAKE 1 TABLET BY MOUTH EVERY DAY   amLODipine 10 MG tablet Commonly known as: NORVASC Take 1 tablet (10 mg total) by mouth daily.   aspirin EC 81 MG tablet Take 81 mg by mouth at bedtime.   atorvastatin 40 MG tablet Commonly known as: LIPITOR Take 1 tablet (40 mg total) by mouth daily at 6 PM.   camphor-menthol lotion Commonly known as: SARNA Apply topically as needed for itching.   cephALEXin 500 MG capsule Commonly known as: KEFLEX Take 1 capsule (500 mg total) by mouth every 6 (six) hours for 4 days.   CLEAR EYES OP Place 1 drop into both eyes daily as needed (dry eyes).   eucerin cream Apply topically as needed for dry skin.   isosorbide mononitrate 60 MG 24 hr tablet Commonly known as: IMDUR TAKE 1 TABLET (60 MG TOTAL) BY MOUTH AT BEDTIME.   methadone 5 MG tablet Commonly known as: DOLOPHINE TAKE 3 TABLETS (15 MG TOTAL) BY MOUTH EVERY 8 (EIGHT) HOURS AS NEEDED FOR SEVERE PAIN. What changed: See the new instructions.   metoprolol succinate 50 MG 24 hr tablet Commonly known as: TOPROL-XL Take 2 tablets (100 mg total) by mouth daily. Take with or immediately following a meal.   multivitamin with minerals Tabs tablet Take 1 tablet by mouth at  bedtime.   nitroGLYCERIN 0.4 MG SL tablet Commonly known as: NITROSTAT Place 1 tablet (0.4 mg total) under the tongue every 5 (five) minutes as needed for chest pain.   potassium chloride SA 20 MEQ tablet Commonly known as: K-DUR Take 1 tablet (20 mEq total) by mouth 2 (two) times daily. What changed: when to take this   Raised Toilet Seat Misc 1 each by Does not apply route as needed.   tamoxifen 20 MG tablet Commonly known as: NOLVADEX Take 1 tablet (20 mg total) by mouth daily. What changed: when to take this   Vitamin D (Ergocalciferol) 1.25 MG (50000 UT) Caps capsule Commonly known as: DRISDOL Take 1 capsule (50,000 Units total) by mouth every 7 (seven) days.      No Known Allergies Follow-up Information    [name], MD. Schedule an appointment as soon as possible for a visit in 2 week(s).   Specialty: Family Medicine Why: f/u in 1-2 weeks. Contact information: [address]  [address] [phone]            The results of significant diagnostics from this hospitalization (including imaging, microbiology, ancillary and laboratory) are listed below for reference.    Significant Diagnostic Studies: Dg Chest 2 View  Result Date: 07/08/2019 CLINICAL DATA:  Left-sided chest pain. Fever. EXAM: CHEST - 2 VIEW COMPARISON:  Radiograph 12/14/2017, 04/04/2017 FINDINGS: Normal heart size and mediastinal contours. Mild heterogeneous bibasilar opacities. Left suprahilar atelectasis/scarring. No pulmonary edema or pleural effusion. No pneumothorax. Surgical clips in the left axilla. Remote right upper rib fracture. Small right cervical rib. IMPRESSION: Mild heterogeneous bibasilar opacities, which may represent atelectasis or pneumonia, including atypical viral infection. Electronically Signed   By: Keith Rake M.D.   On: 07/08/2019 00:54   Dg Forearm Left  Result Date: 07/13/2019 CLINICAL DATA:  Fever and left arm swelling EXAM: LEFT FOREARM -  2 VIEW COMPARISON:  None. FINDINGS: No acute fracture or dislocation is noted. Generalized soft tissue swelling is noted. No foreign body is seen. IMPRESSION: Mild soft tissue swelling without acute bony abnormality. Electronically Signed   By: Inez Catalina M.D.   On: 07/13/2019 23:24   Ct Chest Wo Contrast  Result Date: 07/14/2019 CLINICAL DATA:  58 year old female with history of pneumonia. Shortness of breath. Evaluate for effusion. EXAM: CT CHEST WITHOUT CONTRAST TECHNIQUE: Multidetector CT imaging of the chest was performed following the standard protocol without IV contrast. COMPARISON:  Chest CT 11/17/2015. FINDINGS: Cardiovascular: Heart size is normal. There is no significant pericardial fluid, thickening or pericardial calcification. There is aortic atherosclerosis, as well as atherosclerosis of the great vessels of the mediastinum and the coronary arteries, including calcified atherosclerotic plaque in the left main, left anterior descending, left circumflex and right coronary arteries. Mediastinum/Nodes: No pathologically enlarged mediastinal or hilar lymph nodes. Please note that accurate exclusion of hilar adenopathy is limited on noncontrast CT scans. Esophagus is unremarkable in appearance. No axillary lymphadenopathy. Surgical clips in the left axilla from prior lymph node dissection. Lungs/Pleura: Dependent areas of scarring are noted in the lower lobes of the lungs bilaterally, similar to the prior study. No acute consolidative airspace disease. No pleural effusions. No suspicious appearing pulmonary nodules or masses are noted. Mild diffuse bronchial wall thickening with mild centrilobular and paraseptal emphysema. Upper Abdomen: Aortic atherosclerosis. Mild diffuse low attenuation throughout the visualized hepatic parenchyma, indicative of a background of hepatic steatosis. Musculoskeletal: Status post bilateral modified radical mastectomy and left axillary lymph node dissection. There are  no aggressive appearing lytic or blastic lesions noted in the visualized portions of the skeleton. IMPRESSION: 1. No pleural effusions. 2. No acute findings in the thorax. 3. Mild diffuse bronchial wall thickening with mild centrilobular and paraseptal emphysema; imaging findings suggestive of underlying COPD. 4. Mild hepatic steatosis. 5. Aortic atherosclerosis, in addition to left main and 3 vessel coronary artery disease. Please note that although the presence of coronary artery calcium documents the presence of coronary artery disease, the severity of this disease and any potential stenosis cannot be assessed on this non-gated CT examination. Assessment for potential risk factor modification, dietary therapy or pharmacologic therapy may be warranted, if clinically indicated. Aortic Atherosclerosis (ICD10-I70.0) and Emphysema (ICD10-J43.9). Electronically Signed   By: Vinnie Langton M.D.   On: 07/14/2019 06:58   Dg Chest Port 1 View  Result Date: 07/13/2019 CLINICAL DATA:  Right-sided chest pain EXAM: PORTABLE CHEST 1 VIEW COMPARISON:  07/08/2019 FINDINGS: Cardiac shadow is stable. Aortic calcifications are again seen. Lungs are well aerated  although increasing basilar infiltrate on the left is noted. No bony abnormality is noted. IMPRESSION: Increasing left basilar infiltrate. Electronically Signed   By: Inez Catalina M.D.   On: 07/13/2019 23:22   Vas [name] Lower Extremity Venous (dvt)  Result Date: 07/14/2019  Lower Venous Study Indications: Swelling.  Performing Technologist: Maudry Mayhew MHA, RDMS, RVT, RDCS  Examination Guidelines: A complete evaluation includes B-mode imaging, spectral Doppler, color Doppler, and power Doppler as needed of all accessible portions of each vessel. Bilateral testing is considered an integral part of a complete examination. Limited examinations for reoccurring indications may be performed as noted.   +---------+---------------+---------+-----------+----------+-------+ RIGHT    CompressibilityPhasicitySpontaneityPropertiesSummary +---------+---------------+---------+-----------+----------+-------+ CFV      Full           Yes      Yes                          +---------+---------------+---------+-----------+----------+-------+ SFJ      Full                                                 +---------+---------------+---------+-----------+----------+-------+ FV Prox  Full                                                 +---------+---------------+---------+-----------+----------+-------+ FV Mid   Full                                                 +---------+---------------+---------+-----------+----------+-------+ FV DistalFull                                                 +---------+---------------+---------+-----------+----------+-------+ PFV      Full                                                 +---------+---------------+---------+-----------+----------+-------+ POP      Full           Yes      Yes                          +---------+---------------+---------+-----------+----------+-------+ PTV      Full                                                 +---------+---------------+---------+-----------+----------+-------+ PERO     Full                                                 +---------+---------------+---------+-----------+----------+-------+   +---------+---------------+---------+-----------+----------+-------+ LEFT  CompressibilityPhasicitySpontaneityPropertiesSummary +---------+---------------+---------+-----------+----------+-------+ CFV      Full           Yes      Yes                          +---------+---------------+---------+-----------+----------+-------+ SFJ      Full                                                 +---------+---------------+---------+-----------+----------+-------+ FV Prox  Full                                                  +---------+---------------+---------+-----------+----------+-------+ FV Mid   Full                                                 +---------+---------------+---------+-----------+----------+-------+ FV DistalFull                                                 +---------+---------------+---------+-----------+----------+-------+ PFV      Full                                                 +---------+---------------+---------+-----------+----------+-------+ POP      Full           Yes      Yes                          +---------+---------------+---------+-----------+----------+-------+ PTV      Full                                                 +---------+---------------+---------+-----------+----------+-------+ PERO     Full                                                 +---------+---------------+---------+-----------+----------+-------+     Summary: Right: There is no evidence of deep vein thrombosis in the lower extremity. No cystic structure found in the popliteal fossa. Left: There is no evidence of deep vein thrombosis in the lower extremity. No cystic structure found in the popliteal fossa.  *See table(s) above for measurements and observations. Electronically signed by Deitra Mayo MD on 07/14/2019 at 9:54:42 AM.    Final    Vas [name] Upper Extremity Venous Duplex  Result Date: 07/14/2019 UPPER VENOUS STUDY  Indications: Swelling, and left upper extremity lymphedema Performing Technologist: Maudry Mayhew MHA, RDMS, RVT, RDCS  Examination Guidelines: A complete evaluation includes B-mode imaging, spectral Doppler, color Doppler, and  power Doppler as needed of all accessible portions of each vessel. Bilateral testing is considered an integral part of a complete examination. Limited examinations for reoccurring indications may be performed as noted.  Right Findings:  +----------+------------+---------+-----------+----------+-------+ RIGHT     CompressiblePhasicitySpontaneousPropertiesSummary +----------+------------+---------+-----------+----------+-------+ Subclavian               Yes       Yes                      +----------+------------+---------+-----------+----------+-------+  Left Findings: +----------+------------+---------+-----------+----------+-------+ LEFT      CompressiblePhasicitySpontaneousPropertiesSummary +----------+------------+---------+-----------+----------+-------+ IJV           Full       Yes       Yes                      +----------+------------+---------+-----------+----------+-------+ Subclavian    Full       Yes       Yes                      +----------+------------+---------+-----------+----------+-------+ Axillary      Full       Yes       Yes                      +----------+------------+---------+-----------+----------+-------+ Brachial      Full       Yes       Yes                      +----------+------------+---------+-----------+----------+-------+ Radial        Full                                          +----------+------------+---------+-----------+----------+-------+ Ulnar         Full                                          +----------+------------+---------+-----------+----------+-------+ Cephalic      Full                                          +----------+------------+---------+-----------+----------+-------+ Basilic       Full                                          +----------+------------+---------+-----------+----------+-------+  Summary:  Right: No evidence of thrombosis in the subclavian.  Left: No evidence of deep vein thrombosis in the upper extremity. No evidence of superficial vein thrombosis in the upper extremity.  *See table(s) above for measurements and observations.  Diagnosing physician: Deitra Mayo MD Electronically signed by  Deitra Mayo MD on 07/14/2019 at 9:54:57 AM.    Final     Microbiology: Recent Results (from the past 240 hour(s))  Novel Coronavirus,NAA,(SEND-OUT TO REF LAB - TAT 24-48 hrs); Hosp Order     Status: None   Collection Time: 07/08/19  1:09 AM   Specimen: Nasopharyngeal Swab; Respiratory  Result Value Ref Range Status   SARS-CoV-2, NAA NOT DETECTED NOT DETECTED Final  Comment: (NOTE) This test was developed and its performance characteristics determined by [employer]. This test has not been FDA cleared or approved. This test has been authorized by FDA under an Emergency Use Authorization (EUA). This test is only authorized for the duration of time the declaration that circumstances exist justifying the authorization of the emergency use of in vitro diagnostic tests for detection of SARS-CoV-2 virus and/or diagnosis of COVID-19 infection under section 564(b)(1) of the Act, 21 U.S.C. 937JIR-6(V)(8), unless the authorization is terminated or revoked sooner. When diagnostic testing is negative, the possibility of a false negative result should be considered in the context of a patient's recent exposures and the presence of clinical signs and symptoms consistent with COVID-19. An individual without symptoms of COVID-19 and who is not shedding SARS-CoV-2 virus would expect to have a negative (not detected) result in this assay. Performed  At: [facility] [place], [place] [id number] Rush Farmer MD WC:[id number]    [place]  Final    Comment: Performed at [facility] [address], [place], [place] [address]  Blood Culture (routine x 2)     Status: None (Preliminary result)   Collection Time: 07/13/19 10:56 PM   Specimen: BLOOD  Result Value Ref Range Status   Specimen Description   Final    BLOOD RIGHT ANTECUBITAL Performed at [facility] [address], [place], [place]  [address]    Special Requests   Final    BOTTLES DRAWN AEROBIC AND ANAEROBIC Blood Culture adequate volume Performed at [facility] [address] [place], [place] [address]    Culture   Final    NO GROWTH 3 DAYS Performed at [facility], [place] [address], [place], [place] [address]    Report Status PENDING  Incomplete  Blood Culture (routine x 2)     Status: None (Preliminary result)   Collection Time: 07/13/19 10:56 PM   Specimen: BLOOD RIGHT HAND  Result Value Ref Range Status   Specimen Description   Final    BLOOD RIGHT HAND Performed at [facility], [place] [address] [place], [place] [address]    Special Requests   Final    BOTTLES DRAWN AEROBIC AND ANAEROBIC Blood Culture adequate volume Performed at [facility] [address], [place], [place] [address]    Culture   Final    NO GROWTH 3 DAYS Performed at [facility], [place] [address], [place], [place] [address]    Report Status PENDING  Incomplete  SARS Coronavirus 2 (CEPHEID - Performed in [place] hospital lab), Hosp Order     Status: None   Collection Time: 07/13/19 10:56 PM   Specimen: Nasopharyngeal Swab  Result Value Ref Range Status   SARS Coronavirus 2 NEGATIVE NEGATIVE Final    Comment: (NOTE) If result is NEGATIVE SARS-CoV-2 target nucleic acids are NOT DETECTED. The SARS-CoV-2 RNA is generally detectable in upper and lower  respiratory specimens during the acute phase of infection. The lowest  concentration of SARS-CoV-2 viral copies this assay can detect is 250  copies / mL. A negative result does not preclude SARS-CoV-2 infection  and should not be used as the sole basis for treatment or other  patient management decisions.  A negative result may occur with  improper specimen collection / handling, submission of specimen other  than nasopharyngeal swab, presence of viral mutation(s) within the  areas targeted by this  assay, and inadequate number  of viral copies  (<250 copies / mL). A negative result must be combined with clinical  observations, patient history, and epidemiological information. If result is POSITIVE SARS-CoV-2 target nucleic acids are DETECTED. The SARS-CoV-2 RNA is generally detectable in upper and lower  respiratory specimens dur ing the acute phase of infection.  Positive  results are indicative of active infection with SARS-CoV-2.  Clinical  correlation with patient history and other diagnostic information is  necessary to determine patient infection status.  Positive results do  not rule out bacterial infection or co-infection with other viruses. If result is PRESUMPTIVE POSTIVE SARS-CoV-2 nucleic acids MAY BE PRESENT.   A presumptive positive result was obtained on the submitted specimen  and confirmed on repeat testing.  While 2019 novel coronavirus  (SARS-CoV-2) nucleic acids may be present in the submitted sample  additional confirmatory testing may be necessary for epidemiological  and / or clinical management purposes  to differentiate between  SARS-CoV-2 and other Sarbecovirus currently known to infect humans.  If clinically indicated additional testing with an alternate test  methodology [phone]) is advised. The SARS-CoV-2 RNA is generally  detectable in upper and lower respiratory sp ecimens during the acute  phase of infection. The expected result is Negative. Fact Sheet for Patients:  StrictlyIdeas.no Fact Sheet for Healthcare Providers: BankingDealers.co.za This test is not yet approved or cleared by the [place] FDA and has been authorized for detection and/or diagnosis of SARS-CoV-2 by FDA under an Emergency Use Authorization (EUA).  This EUA will remain in effect (meaning this test can be used) for the duration of the COVID-19 declaration under Section 564(b)(1) of the Act, 21 U.S.C. section 360bbb-3(b)(1), unless the authorization is  terminated or revoked sooner. Performed at [facility], [place] [address], [place], [place] [address]      Labs: Basic Metabolic Panel: Recent Labs  Lab 07/13/19 2256 07/14/19 0617 07/14/19 0622 07/14/19 2226 07/15/19 0615 07/16/19 0604 07/17/19 0530  NA 138  --  136  --  139 140 140  K 3.4*  --  3.3* 3.9 3.6 3.6 3.8  CL 105  --  108  --  112* 108 107  CO2 20*  --  21*  --  19* 22 23  GLUCOSE 103*  --  105*  --  96 98 96  BUN 14  --  14  --  11 9 11   CREATININE 0.99  --  0.93  --  0.86 0.76 0.82  CALCIUM 8.9  --  8.7*  --  8.3* 8.3* 8.8*  MG  --  1.9  --   --   --  2.0  --    Liver Function Tests: Recent Labs  Lab 07/13/19 2256 07/14/19 0622  AST 21 20  ALT 20 18  ALKPHOS 89 82  BILITOT 0.8 0.6  PROT 7.1 6.5  ALBUMIN 3.7 3.3*   No results for input(s): LIPASE, AMYLASE in the last 168 hours. No results for input(s): AMMONIA in the last 168 hours. CBC: Recent Labs  Lab 07/13/19 2256 07/14/19 0622 07/15/19 0615 07/16/19 0604 07/17/19 0530  WBC 20.9* 18.6* 16.0* 12.9* 10.5  NEUTROABS 17.8*  --  11.7* 9.1* 7.0  HGB 13.5 12.4 11.7* 11.8* 12.7  HCT 41.7 38.5 37.3 37.1 39.3  MCV 89.1 89.1 91.2 89.0 88.9  PLT 430* 419* 367 397 434*   Cardiac Enzymes: No results for input(s): CKTOTAL, CKMB, CKMBINDEX, TROPONINI in the last 168 hours. BNP: BNP (  last 3 results) No results for input(s): BNP in the last 8760 hours.  ProBNP (last 3 results) No results for input(s): PROBNP in the last 8760 hours.  CBG: No results for input(s): GLUCAP in the last 168 hours.     Signed:  Irine Seal MD.  [facility] 07/17/2019, 12:45 PM

## 2019-07-18 DIAGNOSIS — I220 Subsequent ST elevation (STEMI) myocardial infarction of anterior wall: Secondary | ICD-10-CM | POA: Diagnosis not present

## 2019-07-19 DIAGNOSIS — I220 Subsequent ST elevation (STEMI) myocardial infarction of anterior wall: Secondary | ICD-10-CM | POA: Diagnosis not present

## 2019-07-19 LAB — CULTURE, BLOOD (ROUTINE X 2)
Culture: NO GROWTH
Culture: NO GROWTH
Special Requests: ADEQUATE
Special Requests: ADEQUATE

## 2019-07-20 ENCOUNTER — Other Ambulatory Visit: Payer: Self-pay | Admitting: *Deleted

## 2019-07-20 DIAGNOSIS — L03114 Cellulitis of left upper limb: Secondary | ICD-10-CM

## 2019-07-20 DIAGNOSIS — I890 Lymphedema, not elsewhere classified: Secondary | ICD-10-CM

## 2019-07-20 DIAGNOSIS — C50412 Malignant neoplasm of upper-outer quadrant of left female breast: Secondary | ICD-10-CM

## 2019-07-20 DIAGNOSIS — I220 Subsequent ST elevation (STEMI) myocardial infarction of anterior wall: Secondary | ICD-10-CM | POA: Diagnosis not present

## 2019-07-21 DIAGNOSIS — I220 Subsequent ST elevation (STEMI) myocardial infarction of anterior wall: Secondary | ICD-10-CM | POA: Diagnosis not present

## 2019-07-22 ENCOUNTER — Other Ambulatory Visit: Payer: Self-pay | Admitting: Family Medicine

## 2019-07-22 DIAGNOSIS — I10 Essential (primary) hypertension: Secondary | ICD-10-CM

## 2019-07-22 DIAGNOSIS — G8929 Other chronic pain: Secondary | ICD-10-CM

## 2019-07-22 DIAGNOSIS — I220 Subsequent ST elevation (STEMI) myocardial infarction of anterior wall: Secondary | ICD-10-CM | POA: Diagnosis not present

## 2019-07-22 DIAGNOSIS — Z853 Personal history of malignant neoplasm of breast: Secondary | ICD-10-CM

## 2019-07-22 DIAGNOSIS — M792 Neuralgia and neuritis, unspecified: Secondary | ICD-10-CM

## 2019-07-23 DIAGNOSIS — I220 Subsequent ST elevation (STEMI) myocardial infarction of anterior wall: Secondary | ICD-10-CM | POA: Diagnosis not present

## 2019-07-23 MED ORDER — ISOSORBIDE MONONITRATE ER 60 MG PO TB24: 60.0000 mg | ORAL_TABLET | Freq: Every day | ORAL | 0 refills | Status: DC

## 2019-07-24 ENCOUNTER — Ambulatory Visit (INDEPENDENT_AMBULATORY_CARE_PROVIDER_SITE_OTHER): Payer: Medicaid Other | Admitting: Family Medicine

## 2019-07-24 ENCOUNTER — Encounter: Payer: Self-pay | Admitting: Family Medicine

## 2019-07-24 ENCOUNTER — Other Ambulatory Visit: Payer: Self-pay

## 2019-07-24 VITALS — BP 124/60 | HR 71 | Ht 65.0 in | Wt 237.0 lb

## 2019-07-24 DIAGNOSIS — M1711 Unilateral primary osteoarthritis, right knee: Secondary | ICD-10-CM

## 2019-07-24 DIAGNOSIS — L03114 Cellulitis of left upper limb: Secondary | ICD-10-CM

## 2019-07-24 DIAGNOSIS — E876 Hypokalemia: Secondary | ICD-10-CM

## 2019-07-24 DIAGNOSIS — I220 Subsequent ST elevation (STEMI) myocardial infarction of anterior wall: Secondary | ICD-10-CM | POA: Diagnosis not present

## 2019-07-24 MED ORDER — MELOXICAM 15 MG PO TABS: 15.0000 mg | ORAL_TABLET | Freq: Every day | ORAL | 0 refills | Status: DC

## 2019-07-24 MED ORDER — CEPHALEXIN 500 MG PO CAPS: 500.0000 mg | ORAL_CAPSULE | Freq: Four times a day (QID) | ORAL | 0 refills | Status: AC

## 2019-07-24 MED ORDER — ALLOPURINOL 300 MG PO TABS: 300.0000 mg | ORAL_TABLET | Freq: Every day | ORAL | 2 refills | Status: DC

## 2019-07-24 MED ORDER — POTASSIUM CHLORIDE CRYS ER 20 MEQ PO TBCR: 20.0000 meq | EXTENDED_RELEASE_TABLET | Freq: Three times a day (TID) | ORAL | 11 refills | Status: DC

## 2019-07-24 NOTE — Progress Notes (Signed)
Subjective:    [name] - 58 y.o. female MRN [id number]  Date of birth: October 07, 1961  CC:  [name] is here for her annual physical.  She would also like to discuss her left forearm cellulitis, right knee pain, and chronic hypokalemia.  HPI:  Left arm cellulitis She was admitted to the hospital on 7/24 and discharged on 7/28 for treatment of left arm cellulitis.  She has chronic lymphedema in her left arm due to axillary node removal for treatment of her breast cancer.  She says that the area affected was around her left elbow, and this area has reduced in swelling and redness since finishing Keflex, but it is still a bit painful.  She has not been wearing her compression sleeve on the advice of the hospitalist since I told her to wait until it was assessed outpatient.  She has some pain in her left hand and notes that it is swollen as well.  Right knee pain Has chronic right knee pain that will flare depending on the weather.  She has no known trauma to this knee and no swelling.  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  She is asking for oxycodone or hydrocodone for this pain today.  She continues to take methadone.  Hypokalemia Currently takes K-Dur 20 mEq 3 times daily.  Whenever she takes less than this, she can become hypokalemic to as low as 2.5.  She no longer takes a potassium wasting diuretic.  She has had no work-up of this in the past.  Health Maintenance:  Has the Cologuard at home and has not yet completed it, but says that she plans to soon. Health Maintenance Due  Topic Date Due  . COLONOSCOPY  10/02/2013  . INFLUENZA VACCINE  07/21/2019    -  reports that she has been smoking cigarettes. She has never used smokeless tobacco. - Review of Systems: Per HPI. - Past Medical History: Patient Active Problem List   Diagnosis Date Noted  . Cellulitis of left forearm   . Cellulitis 07/13/2019  . Bilateral leg edema 06/21/2019  . Dysfunctional  uterine bleeding 04/30/2019  . Right leg pain 09/13/2018  . Acquired trigger finger of right ring finger 05/29/2018  . Hyperlipidemia 02/08/2018  . History of ST elevation myocardial infarction (STEMI) 12/01/2017  . Endometrial hyperplasia, simple 11/08/2017  . Osteoarthritis of knee 10/27/2017  . Uterine fibroid 10/03/2017  . Tinnitus 08/29/2017  . Abdominal mass 08/15/2017  . Elevated blood sugar 09/01/2016  . Hypertensive disorder 04/17/2016  . Dyslipidemia 04/17/2016  . CAD (coronary artery disease) 04/17/2016  . Memory loss 10/18/2015  . Thrombocytosis ([place]) 03/10/2015  . Tobacco use disorder 12/02/2014  . Cancer of overlapping sites of right female breast ([place]) 09/16/2014  . Breast cancer of upper-outer quadrant of left female breast ([place]) 09/16/2014  . Hot flashes due to tamoxifen 05/09/2014  . Lymphedema of arm 11/08/2013  . Chronic pain 03/31/2012  . Hypokalemia 03/31/2012  . Atherosclerosis of aorta ([facility])    - Medications: reviewed and updated   Objective:   Physical Exam BP 124/60   Pulse 71   Ht 5\' 5"  (1.651 m)   Wt 237 lb (107.5 kg)   LMP 08/07/2011   SpO2 97%   BMI 39.44 kg/m  Gen: NAD, alert, cooperative with exam, well-appearing, obese HEENT: NCAT, PERRL, clear conjunctiva, oropharynx clear, supple neck CV: RRR, good S1/S2, no murmur, 1+ pitting pedal edema bilaterally Resp: CTABL, no wheezes, non-labored Abd:  SNTND, BS present, no guarding or organomegaly Skin: Left arm is diffusely swollen, and this extends into the left hand.  No erythema noted, but tender to palpation around the left elbow.  Skin of forearm feels warm on palpation.  No signs of skin breakdown. Right knee: No effusion visualized, mildly tender to palpation on the medial side.  Normal gait, neurovascularly intact. Neuro: no gross deficits.  Psych: good insight, alert and oriented        Assessment & Plan:   Cellulitis of left forearm Patient is at much higher risk for cellulitis  in her left arm due to her chronic lymphedema.  No discrete area of cellulitis is identified, but due to patient's continued pain and warmth and increased risk due to lymphedema, will extend course of Keflex for another week.  Prescribed Keflex 500 mg 4 times daily for 7 days.  Counseled patient that she is safe to wear the compression sleeve, since it will likely reduce her hand pain due to the reduction in swelling.  Hypokalemia Patient's concomitant hypokalemia and hypertension warrant testing for renal and and aldosterone activity.  Although ideally this test would be done at 8 a.m., will check these levels today since it is still earlier in the day.  Osteoarthritis of knee Appears stable.  Patient was counseled that narcotics are not an appropriate treatment for arthritis, and they would especially be contraindicated with her history of narcotic dependence.  She expressed understanding.  Since her kidney function appears to be good, meloxicam 15 mg was prescribed.  Counseled patient to avoid taking ibuprofen and meloxicam at the same time, and also advised her to take frequent breaks from any NSAIDs in order to protect her kidneys and stomach.  Advised her to take Tylenol first before trying an NSAID.    Maia Breslow, M.D. 07/24/2019, 10:50 AM PGY-3, [place]

## 2019-07-24 NOTE — Progress Notes (Signed)
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 resume wearing her compression sleeve.  She was prescribed meloxicam for her left elbow pain.  She understands how to use that.  We are continuing the methadone as previously for her baseline chronic pain syndrome  She will see [name] again in 6 months, which is her usual routine.  She knows to call for any other issue that may develop before that visit.    [name]. Arthi Mcdonald, MD 07/25/19 10:40 AM Medical Oncology and Hematology [facility] [address] [place],  [address] Tel. [phone]    Fax. [phone]    I, Jacqualyn Posey am acting as a Education administrator for Chauncey Cruel, MD.   I, Lurline Del MD, have reviewed the above documentation for accuracy and completeness, and I agree with the above.

## 2019-07-24 NOTE — Assessment & Plan Note (Signed)
Patient is at much higher risk for cellulitis in her left arm due to her chronic lymphedema.  No discrete area of cellulitis is identified, but due to patient's continued pain and warmth and increased risk due to lymphedema, will extend course of Keflex for another week.  Prescribed Keflex 500 mg 4 times daily for 7 days.  Counseled patient that she is safe to wear the compression sleeve, since it will likely reduce her hand pain due to the reduction in swelling.

## 2019-07-24 NOTE — Assessment & Plan Note (Signed)
Appears stable.  Patient was counseled that narcotics are not an appropriate treatment for arthritis, and they would especially be contraindicated with her history of narcotic dependence.  She expressed understanding.  Since her kidney function appears to be good, meloxicam 15 mg was prescribed.  Counseled patient to avoid taking ibuprofen and meloxicam at the same time, and also advised her to take frequent breaks from any NSAIDs in order to protect her kidneys and stomach.  Advised her to take Tylenol first before trying an NSAID.

## 2019-07-24 NOTE — Assessment & Plan Note (Signed)
Patient's concomitant hypokalemia and hypertension warrant testing for renal and and aldosterone activity.  Although ideally this test would be done at 8 a.m., will check these levels today since it is still earlier in the day.

## 2019-07-24 NOTE — Patient Instructions (Signed)
It was nice seeing you today [name]!  I am glad that your left arm cellulitis has improved, but I agree that we need to treat it for a little longer.  I am prescribing Keflex 500 mg 4 times daily for 7 additional days.  Please let me know if you continue to have pain in that area after you finish this course.  You can start using your compression sleeve, which should help with the swelling in your arm and the pain in your left hand.  Today we are measuring 2 hormones that control your potassium level to see if this is why you have low potassium.  We will let you know what these results are when they return.  We are also checking your potassium level.  If you have any questions or concerns, please feel free to call the clinic.   Be well,  Dr. Shan Levans

## 2019-07-25 ENCOUNTER — Inpatient Hospital Stay: Payer: Medicaid Other | Attending: Adult Health | Admitting: Oncology

## 2019-07-25 ENCOUNTER — Other Ambulatory Visit: Payer: Self-pay

## 2019-07-25 VITALS — BP 120/76 | HR 73 | Temp 98.7°F | Resp 18 | Ht 65.0 in | Wt 238.2 lb

## 2019-07-25 DIAGNOSIS — I890 Lymphedema, not elsewhere classified: Secondary | ICD-10-CM | POA: Diagnosis not present

## 2019-07-25 DIAGNOSIS — G894 Chronic pain syndrome: Secondary | ICD-10-CM | POA: Diagnosis not present

## 2019-07-25 DIAGNOSIS — C773 Secondary and unspecified malignant neoplasm of axilla and upper limb lymph nodes: Secondary | ICD-10-CM | POA: Diagnosis not present

## 2019-07-25 DIAGNOSIS — L03114 Cellulitis of left upper limb: Secondary | ICD-10-CM | POA: Diagnosis not present

## 2019-07-25 DIAGNOSIS — Z79810 Long term (current) use of selective estrogen receptor modulators (SERMs): Secondary | ICD-10-CM | POA: Insufficient documentation

## 2019-07-25 DIAGNOSIS — C50412 Malignant neoplasm of upper-outer quadrant of left female breast: Secondary | ICD-10-CM

## 2019-07-25 DIAGNOSIS — Z170 Estrogen receptor positive status [ER+]: Secondary | ICD-10-CM | POA: Insufficient documentation

## 2019-07-25 DIAGNOSIS — C50912 Malignant neoplasm of unspecified site of left female breast: Secondary | ICD-10-CM | POA: Diagnosis not present

## 2019-07-25 DIAGNOSIS — Z79891 Long term (current) use of opiate analgesic: Secondary | ICD-10-CM | POA: Insufficient documentation

## 2019-07-25 DIAGNOSIS — I220 Subsequent ST elevation (STEMI) myocardial infarction of anterior wall: Secondary | ICD-10-CM | POA: Diagnosis not present

## 2019-07-25 DIAGNOSIS — C50811 Malignant neoplasm of overlapping sites of right female breast: Secondary | ICD-10-CM

## 2019-07-25 DIAGNOSIS — F17210 Nicotine dependence, cigarettes, uncomplicated: Secondary | ICD-10-CM | POA: Insufficient documentation

## 2019-07-26 ENCOUNTER — Telehealth: Payer: Self-pay | Admitting: Adult Health

## 2019-07-26 ENCOUNTER — Telehealth: Payer: Self-pay | Admitting: *Deleted

## 2019-07-26 DIAGNOSIS — I220 Subsequent ST elevation (STEMI) myocardial infarction of anterior wall: Secondary | ICD-10-CM | POA: Diagnosis not present

## 2019-07-26 NOTE — Telephone Encounter (Signed)
Pt informed.[name], CMA ° °

## 2019-07-26 NOTE — Telephone Encounter (Signed)
I talk with patient regarding schedule  

## 2019-07-26 NOTE — Telephone Encounter (Signed)
-----   Message from [name], MD sent at 07/26/2019  8:37 AM EDT ----- Would you let Ms. Cashin know that her electrolytes (including her potassium) were normal at her last visit with me?  The hormone levels we tested are still in process.  Thanks.

## 2019-07-27 DIAGNOSIS — I220 Subsequent ST elevation (STEMI) myocardial infarction of anterior wall: Secondary | ICD-10-CM | POA: Diagnosis not present

## 2019-07-28 DIAGNOSIS — I220 Subsequent ST elevation (STEMI) myocardial infarction of anterior wall: Secondary | ICD-10-CM | POA: Diagnosis not present

## 2019-07-29 ENCOUNTER — Other Ambulatory Visit: Payer: Self-pay | Admitting: Oncology

## 2019-07-29 DIAGNOSIS — I220 Subsequent ST elevation (STEMI) myocardial infarction of anterior wall: Secondary | ICD-10-CM | POA: Diagnosis not present

## 2019-07-30 ENCOUNTER — Telehealth: Payer: Self-pay | Admitting: *Deleted

## 2019-07-30 DIAGNOSIS — I220 Subsequent ST elevation (STEMI) myocardial infarction of anterior wall: Secondary | ICD-10-CM | POA: Diagnosis not present

## 2019-07-30 NOTE — Telephone Encounter (Signed)
Pt informed of below.[name], CMA ? ?

## 2019-07-30 NOTE — Telephone Encounter (Signed)
-----   Message from [name], MD sent at 07/27/2019  4:38 PM EDT ----- Would you let [name] know that her hormone levels controlling her potassium are normal?  This means we do not yet have an answer as to why her potassium is always so low, and we will continue to replete it like usual.

## 2019-07-31 DIAGNOSIS — I220 Subsequent ST elevation (STEMI) myocardial infarction of anterior wall: Secondary | ICD-10-CM | POA: Diagnosis not present

## 2019-08-01 ENCOUNTER — Ambulatory Visit: Payer: Medicaid Other | Attending: Oncology | Admitting: Physical Therapy

## 2019-08-01 ENCOUNTER — Other Ambulatory Visit: Payer: Self-pay

## 2019-08-01 DIAGNOSIS — I972 Postmastectomy lymphedema syndrome: Secondary | ICD-10-CM | POA: Insufficient documentation

## 2019-08-01 DIAGNOSIS — I220 Subsequent ST elevation (STEMI) myocardial infarction of anterior wall: Secondary | ICD-10-CM | POA: Diagnosis not present

## 2019-08-01 DIAGNOSIS — R293 Abnormal posture: Secondary | ICD-10-CM | POA: Insufficient documentation

## 2019-08-01 DIAGNOSIS — M6281 Muscle weakness (generalized): Secondary | ICD-10-CM | POA: Insufficient documentation

## 2019-08-01 DIAGNOSIS — M25612 Stiffness of left shoulder, not elsewhere classified: Secondary | ICD-10-CM | POA: Insufficient documentation
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
## 2019-08-02 DIAGNOSIS — I220 Subsequent ST elevation (STEMI) myocardial infarction of anterior wall: Secondary | ICD-10-CM | POA: Diagnosis not present

## 2019-08-03 DIAGNOSIS — I220 Subsequent ST elevation (STEMI) myocardial infarction of anterior wall: Secondary | ICD-10-CM | POA: Diagnosis not present

## 2019-08-04 DIAGNOSIS — I220 Subsequent ST elevation (STEMI) myocardial infarction of anterior wall: Secondary | ICD-10-CM | POA: Diagnosis not present

## 2019-08-05 DIAGNOSIS — I220 Subsequent ST elevation (STEMI) myocardial infarction of anterior wall: Secondary | ICD-10-CM | POA: Diagnosis not present

## 2019-08-06 DIAGNOSIS — I220 Subsequent ST elevation (STEMI) myocardial infarction of anterior wall: Secondary | ICD-10-CM | POA: Diagnosis not present

## 2019-08-07 ENCOUNTER — Encounter: Payer: Self-pay | Admitting: Family Medicine

## 2019-08-07 DIAGNOSIS — I220 Subsequent ST elevation (STEMI) myocardial infarction of anterior wall: Secondary | ICD-10-CM | POA: Diagnosis not present

## 2019-08-08 ENCOUNTER — Other Ambulatory Visit: Payer: Self-pay | Admitting: *Deleted

## 2019-08-08 ENCOUNTER — Telehealth (INDEPENDENT_AMBULATORY_CARE_PROVIDER_SITE_OTHER): Payer: Medicaid Other | Admitting: Family Medicine

## 2019-08-08 ENCOUNTER — Other Ambulatory Visit: Payer: Self-pay

## 2019-08-08 ENCOUNTER — Ambulatory Visit: Payer: Medicaid Other | Admitting: Family Medicine

## 2019-08-08 DIAGNOSIS — I220 Subsequent ST elevation (STEMI) myocardial infarction of anterior wall: Secondary | ICD-10-CM | POA: Diagnosis not present

## 2019-08-08 DIAGNOSIS — I890 Lymphedema, not elsewhere classified: Secondary | ICD-10-CM

## 2019-08-08 DIAGNOSIS — C50811 Malignant neoplasm of overlapping sites of right female breast: Secondary | ICD-10-CM

## 2019-08-08 NOTE — Assessment & Plan Note (Signed)
Patient's only remaining symptom is pain in her elbow, so I am less concerned that she continues to have any cellulitis since she does not have erythema, increased warmth, and swelling.  She also does not have any constitutional symptoms of infection.  Since she has not worsened after stopping antibiotics 1 week ago, we will continue to observe her off of antibiotics.  I will see her next week and will assess her arm at that time.  I also encouraged her to abstain from any activity that strained her left arm to allow it to heal and to use Tylenol for pain relief.  She expressed understanding with this plan.

## 2019-08-08 NOTE — Progress Notes (Signed)
[place] Telemedicine Visit  Patient consented to have virtual visit. Method of visit: Video was attempted, but technology challenges prevented patient from using video, so visit was conducted via telephone.  Encounter participants: Patient: [name] - located at [facility] Provider: [name] - located at home Others (if applicable): none  Chief Complaint: follow up of L arm cellulitis  HPI:  Continues to have pain around her L elbow No warmth in the area, no increased swelling or redness Notes that she does lean on her left elbow frequently but denies any other activities that could have strained it Feels the same today as it did last week Last day of Keflex was 8/12 No constitutional symptoms such as fever, chills, or loss of appetite   ROS: per HPI  Pertinent PMHx: History of breast cancer with axillary node dissection on the left and resulting in lymphedema, STEMI, hypokalemia, cellulitis of the left arm  Exam:  Respiratory: Speaks in complete sentences without shortness of breath  Assessment/Plan:  Lymphedema of arm Patient's only remaining symptom is pain in her elbow, so I am less concerned that she continues to have any cellulitis since she does not have erythema, increased warmth, and swelling.  She also does not have any constitutional symptoms of infection.  Since she has not worsened after stopping antibiotics 1 week ago, we will continue to observe her off of antibiotics.  I will see her next week and will assess her arm at that time.  I also encouraged her to abstain from any activity that strained her left arm to allow it to heal and to use Tylenol for pain relief.  She expressed understanding with this plan.   Also mentioned that she feels that her sinuses are stuffy and would like to discuss this at her visit with me next week.  Time spent during visit with patient: 10 minutes

## 2019-08-09 DIAGNOSIS — I220 Subsequent ST elevation (STEMI) myocardial infarction of anterior wall: Secondary | ICD-10-CM | POA: Diagnosis not present

## 2019-08-10 DIAGNOSIS — I220 Subsequent ST elevation (STEMI) myocardial infarction of anterior wall: Secondary | ICD-10-CM | POA: Diagnosis not present

## 2019-08-10 MED FILL — METHADONE HCL 5 MG TABLET: 5 | 30 days supply | Qty: 270 | Fill #0

## 2019-08-11 DIAGNOSIS — I220 Subsequent ST elevation (STEMI) myocardial infarction of anterior wall: Secondary | ICD-10-CM | POA: Diagnosis not present

## 2019-08-12 ENCOUNTER — Other Ambulatory Visit: Payer: Self-pay | Admitting: Oncology

## 2019-08-12 DIAGNOSIS — I10 Essential (primary) hypertension: Secondary | ICD-10-CM

## 2019-08-12 DIAGNOSIS — M792 Neuralgia and neuritis, unspecified: Secondary | ICD-10-CM

## 2019-08-12 DIAGNOSIS — G8929 Other chronic pain: Secondary | ICD-10-CM

## 2019-08-12 DIAGNOSIS — I220 Subsequent ST elevation (STEMI) myocardial infarction of anterior wall: Secondary | ICD-10-CM | POA: Diagnosis not present

## 2019-08-12 DIAGNOSIS — Z853 Personal history of malignant neoplasm of breast: Secondary | ICD-10-CM

## 2019-08-12 MED ORDER — METHADONE HCL 5 MG PO TABS: 5.0000 mg | ORAL_TABLET | Freq: Three times a day (TID) | ORAL | 0 refills | Status: DC

## 2019-08-13 DIAGNOSIS — I220 Subsequent ST elevation (STEMI) myocardial infarction of anterior wall: Secondary | ICD-10-CM | POA: Diagnosis not present

## 2019-08-14 DIAGNOSIS — I220 Subsequent ST elevation (STEMI) myocardial infarction of anterior wall: Secondary | ICD-10-CM | POA: Diagnosis not present

## 2019-08-15 DIAGNOSIS — I220 Subsequent ST elevation (STEMI) myocardial infarction of anterior wall: Secondary | ICD-10-CM | POA: Diagnosis not present

## 2019-08-16 ENCOUNTER — Ambulatory Visit: Payer: Medicaid Other | Admitting: Family Medicine

## 2019-08-16 ENCOUNTER — Ambulatory Visit: Payer: Medicaid Other | Admitting: Physical Therapy

## 2019-08-16 DIAGNOSIS — I220 Subsequent ST elevation (STEMI) myocardial infarction of anterior wall: Secondary | ICD-10-CM | POA: Diagnosis not present

## 2019-08-17 DIAGNOSIS — I220 Subsequent ST elevation (STEMI) myocardial infarction of anterior wall: Secondary | ICD-10-CM | POA: Diagnosis not present

## 2019-08-18 DIAGNOSIS — I220 Subsequent ST elevation (STEMI) myocardial infarction of anterior wall: Secondary | ICD-10-CM | POA: Diagnosis not present

## 2019-08-19 DIAGNOSIS — I220 Subsequent ST elevation (STEMI) myocardial infarction of anterior wall: Secondary | ICD-10-CM | POA: Diagnosis not present

## 2019-08-20 ENCOUNTER — Other Ambulatory Visit: Payer: Self-pay | Admitting: Family Medicine

## 2019-08-20 DIAGNOSIS — I220 Subsequent ST elevation (STEMI) myocardial infarction of anterior wall: Secondary | ICD-10-CM | POA: Diagnosis not present

## 2019-08-21 DIAGNOSIS — I220 Subsequent ST elevation (STEMI) myocardial infarction of anterior wall: Secondary | ICD-10-CM | POA: Diagnosis not present

## 2019-08-22 ENCOUNTER — Ambulatory Visit: Payer: Medicaid Other | Attending: Oncology | Admitting: Physical Therapy

## 2019-08-22 DIAGNOSIS — R293 Abnormal posture: Secondary | ICD-10-CM | POA: Insufficient documentation

## 2019-08-22 DIAGNOSIS — I972 Postmastectomy lymphedema syndrome: Secondary | ICD-10-CM | POA: Insufficient documentation

## 2019-08-22 DIAGNOSIS — M25612 Stiffness of left shoulder, not elsewhere classified: Secondary | ICD-10-CM | POA: Insufficient documentation

## 2019-08-22 DIAGNOSIS — M6281 Muscle weakness (generalized): Secondary | ICD-10-CM | POA: Insufficient documentation

## 2019-08-22 DIAGNOSIS — I220 Subsequent ST elevation (STEMI) myocardial infarction of anterior wall: Secondary | ICD-10-CM | POA: Diagnosis not present

## 2019-08-22 NOTE — Telephone Encounter (Signed)
Called pt as she did not come to her appointment again today.  She said her arm is doing better, but she wants to come to her appointment on Friday to have manual lymph drainage. She wants to have her demographics sent to [facility] to get measured for compression garments on Sept 14 so they were sent  Maudry Diego, PT @TODAY @ 3:52 PM

## 2019-08-23 DIAGNOSIS — I220 Subsequent ST elevation (STEMI) myocardial infarction of anterior wall: Secondary | ICD-10-CM | POA: Diagnosis not present

## 2019-08-24 ENCOUNTER — Ambulatory Visit: Payer: Medicaid Other | Admitting: Rehabilitation

## 2019-08-24 ENCOUNTER — Encounter: Payer: Self-pay | Admitting: Rehabilitation

## 2019-08-24 ENCOUNTER — Other Ambulatory Visit: Payer: Self-pay

## 2019-08-24 ENCOUNTER — Ambulatory Visit: Payer: Medicaid Other | Admitting: Physician Assistant

## 2019-08-24 DIAGNOSIS — R293 Abnormal posture: Secondary | ICD-10-CM

## 2019-08-24 DIAGNOSIS — M6281 Muscle weakness (generalized): Secondary | ICD-10-CM

## 2019-08-24 DIAGNOSIS — I220 Subsequent ST elevation (STEMI) myocardial infarction of anterior wall: Secondary | ICD-10-CM | POA: Diagnosis not present

## 2019-08-24 DIAGNOSIS — M25612 Stiffness of left shoulder, not elsewhere classified: Secondary | ICD-10-CM

## 2019-08-24 DIAGNOSIS — I972 Postmastectomy lymphedema syndrome: Secondary | ICD-10-CM
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
## 2019-08-25 DIAGNOSIS — I220 Subsequent ST elevation (STEMI) myocardial infarction of anterior wall: Secondary | ICD-10-CM | POA: Diagnosis not present

## 2019-08-26 DIAGNOSIS — I220 Subsequent ST elevation (STEMI) myocardial infarction of anterior wall: Secondary | ICD-10-CM | POA: Diagnosis not present

## 2019-08-27 DIAGNOSIS — I220 Subsequent ST elevation (STEMI) myocardial infarction of anterior wall: Secondary | ICD-10-CM | POA: Diagnosis not present

## 2019-08-28 DIAGNOSIS — I220 Subsequent ST elevation (STEMI) myocardial infarction of anterior wall: Secondary | ICD-10-CM | POA: Diagnosis not present

## 2019-08-29 ENCOUNTER — Other Ambulatory Visit: Payer: Self-pay

## 2019-08-29 ENCOUNTER — Ambulatory Visit: Payer: Medicaid Other | Admitting: Podiatry

## 2019-08-29 ENCOUNTER — Ambulatory Visit: Payer: Medicaid Other | Admitting: Physical Therapy

## 2019-08-29 DIAGNOSIS — R293 Abnormal posture: Secondary | ICD-10-CM

## 2019-08-29 DIAGNOSIS — M25612 Stiffness of left shoulder, not elsewhere classified: Secondary | ICD-10-CM

## 2019-08-29 DIAGNOSIS — M6281 Muscle weakness (generalized): Secondary | ICD-10-CM

## 2019-08-29 DIAGNOSIS — I220 Subsequent ST elevation (STEMI) myocardial infarction of anterior wall: Secondary | ICD-10-CM | POA: Diagnosis not present

## 2019-08-29 DIAGNOSIS — I972 Postmastectomy lymphedema syndrome: Secondary | ICD-10-CM
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
## 2019-08-30 DIAGNOSIS — I220 Subsequent ST elevation (STEMI) myocardial infarction of anterior wall: Secondary | ICD-10-CM | POA: Diagnosis not present

## 2019-08-31 ENCOUNTER — Other Ambulatory Visit: Payer: Self-pay

## 2019-08-31 ENCOUNTER — Encounter: Payer: Self-pay | Admitting: Rehabilitation

## 2019-08-31 ENCOUNTER — Ambulatory Visit: Payer: Medicaid Other | Admitting: Rehabilitation

## 2019-08-31 DIAGNOSIS — R293 Abnormal posture: Secondary | ICD-10-CM

## 2019-08-31 DIAGNOSIS — I220 Subsequent ST elevation (STEMI) myocardial infarction of anterior wall: Secondary | ICD-10-CM | POA: Diagnosis not present

## 2019-08-31 DIAGNOSIS — M25612 Stiffness of left shoulder, not elsewhere classified: Secondary | ICD-10-CM

## 2019-08-31 DIAGNOSIS — I972 Postmastectomy lymphedema syndrome: Secondary | ICD-10-CM | POA: Diagnosis not present

## 2019-08-31 DIAGNOSIS — M6281 Muscle weakness (generalized): Secondary | ICD-10-CM
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
## 2019-09-01 DIAGNOSIS — I220 Subsequent ST elevation (STEMI) myocardial infarction of anterior wall: Secondary | ICD-10-CM | POA: Diagnosis not present

## 2019-09-02 DIAGNOSIS — I220 Subsequent ST elevation (STEMI) myocardial infarction of anterior wall: Secondary | ICD-10-CM | POA: Diagnosis not present

## 2019-09-03 DIAGNOSIS — I220 Subsequent ST elevation (STEMI) myocardial infarction of anterior wall: Secondary | ICD-10-CM | POA: Diagnosis not present

## 2019-09-04 DIAGNOSIS — I220 Subsequent ST elevation (STEMI) myocardial infarction of anterior wall: Secondary | ICD-10-CM | POA: Diagnosis not present

## 2019-09-05 ENCOUNTER — Encounter: Payer: Self-pay | Admitting: General Practice

## 2019-09-05 DIAGNOSIS — I220 Subsequent ST elevation (STEMI) myocardial infarction of anterior wall: Secondary | ICD-10-CM | POA: Diagnosis not present

## 2019-09-05 NOTE — Progress Notes (Signed)
[place] CSW Progress Notes  Call from patient, states she needs help w completing [facility] financial assistance application.  Cannot print out document that has been emailed to her phone.  Confrimed w [facility] that patient has applied for help from [employer] which now administers all funds for breast cancer (previously were working through [employer]).  Downloaded application for patient, will meet w patient on 9/18 to have her complete/sign her part prior to submission.  Certification letter requested from oncologist.  Edwyna Shell, [place] Social Worker Phone:  [phone]

## 2019-09-06 ENCOUNTER — Other Ambulatory Visit: Payer: Self-pay | Admitting: *Deleted

## 2019-09-06 ENCOUNTER — Other Ambulatory Visit: Payer: Self-pay | Admitting: Oncology

## 2019-09-06 DIAGNOSIS — Z853 Personal history of malignant neoplasm of breast: Secondary | ICD-10-CM

## 2019-09-06 DIAGNOSIS — L03114 Cellulitis of left upper limb: Secondary | ICD-10-CM

## 2019-09-06 DIAGNOSIS — G8929 Other chronic pain: Secondary | ICD-10-CM

## 2019-09-06 DIAGNOSIS — I220 Subsequent ST elevation (STEMI) myocardial infarction of anterior wall: Secondary | ICD-10-CM | POA: Diagnosis not present

## 2019-09-06 DIAGNOSIS — I10 Essential (primary) hypertension: Secondary | ICD-10-CM

## 2019-09-06 DIAGNOSIS — M792 Neuralgia and neuritis, unspecified: Secondary | ICD-10-CM

## 2019-09-06 DIAGNOSIS — I890 Lymphedema, not elsewhere classified: Secondary | ICD-10-CM

## 2019-09-06 MED ORDER — METHADONE HCL 5 MG PO TABS: 15.0000 mg | ORAL_TABLET | Freq: Three times a day (TID) | ORAL | 0 refills | Status: DC

## 2019-09-07 ENCOUNTER — Inpatient Hospital Stay: Payer: Medicaid Other | Admitting: General Practice

## 2019-09-07 DIAGNOSIS — I220 Subsequent ST elevation (STEMI) myocardial infarction of anterior wall: Secondary | ICD-10-CM | POA: Diagnosis not present

## 2019-09-07 MED FILL — METHADONE HCL 5 MG TABLET: 5 | 30 days supply | Qty: 270 | Fill #0

## 2019-09-07 NOTE — Progress Notes (Deleted)
[place] CSW Progress Notes  Patient cancelled appointment for today to complete [name] application for financial assistance, stating she was unwell.  Mailed patient's portion of application to her and included stamped envelope addressed to undersigned at [facility], patient instructed to complete her portion and mail back.  Will submit on her behalf when documents are received.  Edwyna Shell, LCSW Clinical Social Worker Phone:  [phone]

## 2019-09-08 DIAGNOSIS — I220 Subsequent ST elevation (STEMI) myocardial infarction of anterior wall: Secondary | ICD-10-CM | POA: Diagnosis not present

## 2019-09-09 DIAGNOSIS — I220 Subsequent ST elevation (STEMI) myocardial infarction of anterior wall: Secondary | ICD-10-CM | POA: Diagnosis not present

## 2019-09-10 ENCOUNTER — Ambulatory Visit: Payer: Medicaid Other | Admitting: Family Medicine

## 2019-09-10 DIAGNOSIS — I220 Subsequent ST elevation (STEMI) myocardial infarction of anterior wall: Secondary | ICD-10-CM | POA: Diagnosis not present

## 2019-09-11 DIAGNOSIS — I220 Subsequent ST elevation (STEMI) myocardial infarction of anterior wall: Secondary | ICD-10-CM | POA: Diagnosis not present

## 2019-09-12 ENCOUNTER — Ambulatory Visit: Payer: Medicaid Other | Admitting: Podiatry

## 2019-09-12 DIAGNOSIS — I220 Subsequent ST elevation (STEMI) myocardial infarction of anterior wall: Secondary | ICD-10-CM | POA: Diagnosis not present

## 2019-09-13 ENCOUNTER — Encounter: Payer: Self-pay | Admitting: General Practice

## 2019-09-13 DIAGNOSIS — I220 Subsequent ST elevation (STEMI) myocardial infarction of anterior wall: Secondary | ICD-10-CM | POA: Diagnosis not present

## 2019-09-13 NOTE — Progress Notes (Signed)
[place] CSW Progress Notes  CSW mailed application for [employer] via [employer] mail room on 9/18 as patient did not come to appointment that day.  Patient called, she has not received it yet.  Encouraged her to give it 1 - 2 weeks for delivery as mail can be slow.  Edwyna Shell, LCSW Clinical Social Worker Phone:  [phone]

## 2019-09-14 DIAGNOSIS — I220 Subsequent ST elevation (STEMI) myocardial infarction of anterior wall: Secondary | ICD-10-CM | POA: Diagnosis not present

## 2019-09-15 DIAGNOSIS — I220 Subsequent ST elevation (STEMI) myocardial infarction of anterior wall: Secondary | ICD-10-CM | POA: Diagnosis not present

## 2019-09-16 DIAGNOSIS — I220 Subsequent ST elevation (STEMI) myocardial infarction of anterior wall: Secondary | ICD-10-CM | POA: Diagnosis not present

## 2019-09-17 DIAGNOSIS — I220 Subsequent ST elevation (STEMI) myocardial infarction of anterior wall: Secondary | ICD-10-CM | POA: Diagnosis not present

## 2019-09-18 DIAGNOSIS — I220 Subsequent ST elevation (STEMI) myocardial infarction of anterior wall: Secondary | ICD-10-CM | POA: Diagnosis not present

## 2019-09-19 ENCOUNTER — Encounter: Payer: Self-pay | Admitting: General Practice

## 2019-09-19 DIAGNOSIS — I220 Subsequent ST elevation (STEMI) myocardial infarction of anterior wall: Secondary | ICD-10-CM | POA: Diagnosis not present

## 2019-09-19 NOTE — Progress Notes (Signed)
[place] CSW Progress Notes  Call from patient, stated she will drop off [employer] application this week.  No showed for appointment today to drop off forms.  States she will come with forms on Friday to give to [place].  Will call when she knows what time.    Edwyna Shell, LCSW Clinical Social Worker Phone:  [phone] Cell:  [phone]

## 2019-09-20 DIAGNOSIS — I220 Subsequent ST elevation (STEMI) myocardial infarction of anterior wall: Secondary | ICD-10-CM | POA: Diagnosis not present

## 2019-09-21 DIAGNOSIS — I220 Subsequent ST elevation (STEMI) myocardial infarction of anterior wall: Secondary | ICD-10-CM | POA: Diagnosis not present

## 2019-09-22 DIAGNOSIS — I220 Subsequent ST elevation (STEMI) myocardial infarction of anterior wall: Secondary | ICD-10-CM | POA: Diagnosis not present

## 2019-09-23 DIAGNOSIS — I220 Subsequent ST elevation (STEMI) myocardial infarction of anterior wall: Secondary | ICD-10-CM | POA: Diagnosis not present

## 2019-09-24 ENCOUNTER — Ambulatory Visit: Payer: Medicaid Other | Admitting: Podiatry

## 2019-09-24 DIAGNOSIS — I220 Subsequent ST elevation (STEMI) myocardial infarction of anterior wall: Secondary | ICD-10-CM | POA: Diagnosis not present

## 2019-09-25 DIAGNOSIS — I220 Subsequent ST elevation (STEMI) myocardial infarction of anterior wall: Secondary | ICD-10-CM | POA: Diagnosis not present

## 2019-09-26 ENCOUNTER — Ambulatory Visit: Payer: Medicaid Other | Admitting: Physician Assistant

## 2019-09-26 DIAGNOSIS — I220 Subsequent ST elevation (STEMI) myocardial infarction of anterior wall: Secondary | ICD-10-CM | POA: Diagnosis not present
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
## 2019-09-27 ENCOUNTER — Encounter (HOSPITAL_COMMUNITY): Payer: Self-pay | Admitting: Emergency Medicine

## 2019-09-27 ENCOUNTER — Emergency Department (HOSPITAL_COMMUNITY): Payer: Medicaid Other

## 2019-09-27 ENCOUNTER — Other Ambulatory Visit: Payer: Self-pay

## 2019-09-27 DIAGNOSIS — M109 Gout, unspecified: Secondary | ICD-10-CM | POA: Diagnosis present

## 2019-09-27 DIAGNOSIS — C50412 Malignant neoplasm of upper-outer quadrant of left female breast: Secondary | ICD-10-CM | POA: Diagnosis present

## 2019-09-27 DIAGNOSIS — Z9221 Personal history of antineoplastic chemotherapy: Secondary | ICD-10-CM

## 2019-09-27 DIAGNOSIS — Z79899 Other long term (current) drug therapy: Secondary | ICD-10-CM

## 2019-09-27 DIAGNOSIS — R600 Localized edema: Secondary | ICD-10-CM | POA: Diagnosis present

## 2019-09-27 DIAGNOSIS — G894 Chronic pain syndrome: Secondary | ICD-10-CM | POA: Diagnosis present

## 2019-09-27 DIAGNOSIS — I25118 Atherosclerotic heart disease of native coronary artery with other forms of angina pectoris: Secondary | ICD-10-CM | POA: Diagnosis present

## 2019-09-27 DIAGNOSIS — R7982 Elevated C-reactive protein (CRP): Secondary | ICD-10-CM | POA: Diagnosis present

## 2019-09-27 DIAGNOSIS — I252 Old myocardial infarction: Secondary | ICD-10-CM

## 2019-09-27 DIAGNOSIS — I878 Other specified disorders of veins: Secondary | ICD-10-CM | POA: Diagnosis present

## 2019-09-27 DIAGNOSIS — I10 Essential (primary) hypertension: Secondary | ICD-10-CM | POA: Diagnosis present

## 2019-09-27 DIAGNOSIS — I890 Lymphedema, not elsewhere classified: Secondary | ICD-10-CM | POA: Diagnosis present

## 2019-09-27 DIAGNOSIS — Z79891 Long term (current) use of opiate analgesic: Secondary | ICD-10-CM

## 2019-09-27 DIAGNOSIS — R509 Fever, unspecified: Secondary | ICD-10-CM | POA: Diagnosis not present

## 2019-09-27 DIAGNOSIS — I220 Subsequent ST elevation (STEMI) myocardial infarction of anterior wall: Secondary | ICD-10-CM | POA: Diagnosis not present

## 2019-09-27 DIAGNOSIS — Z20828 Contact with and (suspected) exposure to other viral communicable diseases: Secondary | ICD-10-CM | POA: Diagnosis not present

## 2019-09-27 DIAGNOSIS — L03114 Cellulitis of left upper limb: Secondary | ICD-10-CM | POA: Diagnosis not present

## 2019-09-27 DIAGNOSIS — M170 Bilateral primary osteoarthritis of knee: Secondary | ICD-10-CM | POA: Diagnosis present

## 2019-09-27 DIAGNOSIS — Z955 Presence of coronary angioplasty implant and graft: Secondary | ICD-10-CM

## 2019-09-27 DIAGNOSIS — R079 Chest pain, unspecified: Secondary | ICD-10-CM | POA: Diagnosis not present

## 2019-09-27 DIAGNOSIS — D259 Leiomyoma of uterus, unspecified: Secondary | ICD-10-CM | POA: Diagnosis present

## 2019-09-27 DIAGNOSIS — Z9013 Acquired absence of bilateral breasts and nipples: Secondary | ICD-10-CM

## 2019-09-27 DIAGNOSIS — D649 Anemia, unspecified: Secondary | ICD-10-CM | POA: Diagnosis present

## 2019-09-27 DIAGNOSIS — Z923 Personal history of irradiation: Secondary | ICD-10-CM

## 2019-09-27 DIAGNOSIS — Z8249 Family history of ischemic heart disease and other diseases of the circulatory system: Secondary | ICD-10-CM

## 2019-09-27 DIAGNOSIS — Z7982 Long term (current) use of aspirin: Secondary | ICD-10-CM

## 2019-09-27 DIAGNOSIS — E785 Hyperlipidemia, unspecified: Secondary | ICD-10-CM | POA: Diagnosis present

## 2019-09-27 DIAGNOSIS — Z6841 Body Mass Index (BMI) 40.0 and over, adult: Secondary | ICD-10-CM

## 2019-09-27 DIAGNOSIS — C50911 Malignant neoplasm of unspecified site of right female breast: Secondary | ICD-10-CM | POA: Diagnosis present

## 2019-09-27 DIAGNOSIS — Z803 Family history of malignant neoplasm of breast: Secondary | ICD-10-CM

## 2019-09-27 DIAGNOSIS — Z79810 Long term (current) use of selective estrogen receptor modulators (SERMs): Secondary | ICD-10-CM

## 2019-09-27 DIAGNOSIS — F17210 Nicotine dependence, cigarettes, uncomplicated: Secondary | ICD-10-CM | POA: Diagnosis present

## 2019-09-27 LAB — BASIC METABOLIC PANEL
Anion gap: 10 (ref 5–15)
BUN: 12 mg/dL (ref 6–20)
CO2: 19 mmol/L — ABNORMAL LOW (ref 22–32)
Calcium: 8.6 mg/dL — ABNORMAL LOW (ref 8.9–10.3)
Chloride: 110 mmol/L (ref 98–111)
Creatinine, Ser: 0.9 mg/dL (ref 0.44–1.00)
GFR calc Af Amer: >60 mL/min (ref >60)
GFR calc non Af Amer: >60 mL/min (ref >60)
Glucose, Bld: 153 mg/dL — ABNORMAL HIGH (ref 70–99)
Potassium: 2.9 mmol/L — ABNORMAL LOW (ref 3.5–5.1)
Sodium: 139 mmol/L (ref 135–145)

## 2019-09-27 LAB — I-STAT BETA HCG BLOOD, ED (NOT ORDERABLE): I-stat hCG, quantitative: <5 m[IU]/mL (ref <5)

## 2019-09-27 LAB — CBC
HCT: 41.2 % (ref 36.0–46.0)
Hemoglobin: 13.5 g/dL (ref 12.0–15.0)
MCH: 28.4 pg (ref 26.0–34.0)
MCHC: 32.8 g/dL (ref 30.0–36.0)
MCV: 86.7 fL (ref 80.0–100.0)
Platelets: 429 10*3/uL — ABNORMAL HIGH (ref 150–400)
RBC: 4.75 MIL/uL (ref 3.87–5.11)
RDW: 15.8 % — ABNORMAL HIGH (ref 11.5–15.5)
WBC: 18.9 10*3/uL — ABNORMAL HIGH (ref 4.0–10.5)
nRBC: 0 % (ref 0.0–0.2)

## 2019-09-27 LAB — TROPONIN I (HIGH SENSITIVITY): Troponin I (High Sensitivity): 5 ng/L (ref <18)

## 2019-09-27 MED ORDER — SODIUM CHLORIDE 0.9% FLUSH: 3.0000 mL | Freq: Once | INTRAVENOUS | Status: DC

## 2019-09-27 NOTE — ED Triage Notes (Addendum)
Patient c/o sharp constant central chest pains x30 minutes. Also c/o swelling to left arm x3 hours. Hx lymphedema. Patient reports taking tylenol PTA.

## 2019-09-28 ENCOUNTER — Observation Stay (HOSPITAL_COMMUNITY): Payer: Medicaid Other

## 2019-09-28 ENCOUNTER — Inpatient Hospital Stay (HOSPITAL_COMMUNITY)
Admission: EM | Admit: 2019-09-28 | Discharge: 2019-10-01 | DRG: 603 | Disposition: A | Discharge status: Home / Self Care | Payer: Medicaid Other | Attending: Internal Medicine | Admitting: Internal Medicine

## 2019-09-28 DIAGNOSIS — Z8249 Family history of ischemic heart disease and other diseases of the circulatory system: Secondary | ICD-10-CM | POA: Diagnosis not present

## 2019-09-28 DIAGNOSIS — I220 Subsequent ST elevation (STEMI) myocardial infarction of anterior wall: Secondary | ICD-10-CM | POA: Diagnosis not present

## 2019-09-28 DIAGNOSIS — Z803 Family history of malignant neoplasm of breast: Secondary | ICD-10-CM | POA: Diagnosis not present

## 2019-09-28 DIAGNOSIS — I2510 Atherosclerotic heart disease of native coronary artery without angina pectoris: Secondary | ICD-10-CM | POA: Diagnosis not present

## 2019-09-28 DIAGNOSIS — R600 Localized edema: Secondary | ICD-10-CM | POA: Diagnosis present

## 2019-09-28 DIAGNOSIS — C50412 Malignant neoplasm of upper-outer quadrant of left female breast: Secondary | ICD-10-CM | POA: Diagnosis not present

## 2019-09-28 DIAGNOSIS — M170 Bilateral primary osteoarthritis of knee: Secondary | ICD-10-CM | POA: Diagnosis present

## 2019-09-28 DIAGNOSIS — R509 Fever, unspecified: Secondary | ICD-10-CM | POA: Diagnosis not present

## 2019-09-28 DIAGNOSIS — M109 Gout, unspecified: Secondary | ICD-10-CM | POA: Diagnosis present

## 2019-09-28 DIAGNOSIS — Z9013 Acquired absence of bilateral breasts and nipples: Secondary | ICD-10-CM | POA: Diagnosis not present

## 2019-09-28 DIAGNOSIS — M7989 Other specified soft tissue disorders: Secondary | ICD-10-CM

## 2019-09-28 DIAGNOSIS — I25118 Atherosclerotic heart disease of native coronary artery with other forms of angina pectoris: Secondary | ICD-10-CM | POA: Diagnosis present

## 2019-09-28 DIAGNOSIS — I890 Lymphedema, not elsewhere classified: Secondary | ICD-10-CM

## 2019-09-28 DIAGNOSIS — Z79891 Long term (current) use of opiate analgesic: Secondary | ICD-10-CM | POA: Diagnosis not present

## 2019-09-28 DIAGNOSIS — Z6841 Body Mass Index (BMI) 40.0 and over, adult: Secondary | ICD-10-CM | POA: Diagnosis not present

## 2019-09-28 DIAGNOSIS — Z20828 Contact with and (suspected) exposure to other viral communicable diseases: Secondary | ICD-10-CM | POA: Diagnosis not present

## 2019-09-28 DIAGNOSIS — R52 Pain, unspecified: Secondary | ICD-10-CM

## 2019-09-28 DIAGNOSIS — E785 Hyperlipidemia, unspecified: Secondary | ICD-10-CM | POA: Diagnosis present

## 2019-09-28 DIAGNOSIS — G894 Chronic pain syndrome: Secondary | ICD-10-CM | POA: Diagnosis present

## 2019-09-28 DIAGNOSIS — L03114 Cellulitis of left upper limb: Secondary | ICD-10-CM | POA: Diagnosis present

## 2019-09-28 DIAGNOSIS — D649 Anemia, unspecified: Secondary | ICD-10-CM | POA: Diagnosis present

## 2019-09-28 DIAGNOSIS — I252 Old myocardial infarction: Secondary | ICD-10-CM | POA: Diagnosis not present

## 2019-09-28 DIAGNOSIS — L0390 Cellulitis, unspecified: Secondary | ICD-10-CM | POA: Diagnosis present

## 2019-09-28 DIAGNOSIS — I10 Essential (primary) hypertension: Secondary | ICD-10-CM | POA: Diagnosis present

## 2019-09-28 DIAGNOSIS — Z9221 Personal history of antineoplastic chemotherapy: Secondary | ICD-10-CM | POA: Diagnosis not present

## 2019-09-28 DIAGNOSIS — R079 Chest pain, unspecified: Secondary | ICD-10-CM | POA: Diagnosis not present

## 2019-09-28 DIAGNOSIS — D259 Leiomyoma of uterus, unspecified: Secondary | ICD-10-CM | POA: Diagnosis present

## 2019-09-28 DIAGNOSIS — Z955 Presence of coronary angioplasty implant and graft: Secondary | ICD-10-CM | POA: Diagnosis not present

## 2019-09-28 DIAGNOSIS — C50911 Malignant neoplasm of unspecified site of right female breast: Secondary | ICD-10-CM | POA: Diagnosis present

## 2019-09-28 DIAGNOSIS — G8929 Other chronic pain: Secondary | ICD-10-CM | POA: Diagnosis present

## 2019-09-28 DIAGNOSIS — Z923 Personal history of irradiation: Secondary | ICD-10-CM | POA: Diagnosis not present

## 2019-09-28 LAB — ECHOCARDIOGRAM COMPLETE

## 2019-09-28 LAB — BASIC METABOLIC PANEL
Anion gap: 12 (ref 5–15)
BUN: 10 mg/dL (ref 6–20)
CO2: 18 mmol/L — ABNORMAL LOW (ref 22–32)
Calcium: 8.2 mg/dL — ABNORMAL LOW (ref 8.9–10.3)
Chloride: 108 mmol/L (ref 98–111)
Creatinine, Ser: 0.81 mg/dL (ref 0.44–1.00)
GFR calc Af Amer: >60 mL/min (ref >60)
GFR calc non Af Amer: >60 mL/min (ref >60)
Glucose, Bld: 119 mg/dL — ABNORMAL HIGH (ref 70–99)
Potassium: 3.4 mmol/L — ABNORMAL LOW (ref 3.5–5.1)
Sodium: 138 mmol/L (ref 135–145)

## 2019-09-28 LAB — CK: Total CK: 79 U/L (ref 38–234)

## 2019-09-28 LAB — TROPONIN I (HIGH SENSITIVITY): Troponin I (High Sensitivity): 5 ng/L (ref <18)

## 2019-09-28 LAB — SARS CORONAVIRUS 2 (TAT 6-24 HRS): SARS Coronavirus 2: NEGATIVE

## 2019-09-28 LAB — LACTIC ACID, PLASMA
Lactic Acid, Venous: 1.4 mmol/L (ref 0.5–1.9)
Lactic Acid, Venous: 1.2 mmol/L (ref 0.5–1.9)

## 2019-09-28 LAB — SEDIMENTATION RATE: Sed Rate: 16 mm/hr (ref 0–22)

## 2019-09-28 LAB — MAGNESIUM: Magnesium: 1.8 mg/dL (ref 1.7–2.4)

## 2019-09-28 MED ORDER — ASPIRIN EC 81 MG PO TBEC
81.0000 mg | DELAYED_RELEASE_TABLET | Freq: Every day | ORAL | Status: DC
  Administered 2019-09-28 – 2019-09-30 (×3): 81 mg via ORAL
  Filled 2019-09-28 (×3): qty 1

## 2019-09-28 MED ORDER — ADULT MULTIVITAMIN W/MINERALS CH
1.0000 | ORAL_TABLET | Freq: Every day | ORAL | Status: DC
  Administered 2019-09-28 – 2019-09-30 (×3): 1 via ORAL
  Filled 2019-09-28 (×3): qty 1

## 2019-09-28 MED ORDER — ATORVASTATIN CALCIUM 40 MG PO TABS
40.0000 mg | ORAL_TABLET | Freq: Every day | ORAL | Status: DC
  Administered 2019-09-28 – 2019-09-30 (×3): 40 mg via ORAL
  Filled 2019-09-28 (×3): qty 1

## 2019-09-28 MED ORDER — SODIUM CHLORIDE 0.9 % IV SOLN
2.0000 g | INTRAVENOUS | Status: DC
  Administered 2019-09-29 – 2019-10-01 (×3): 2 g via INTRAVENOUS
  Filled 2019-09-28 (×3): qty 2

## 2019-09-28 MED ORDER — SODIUM CHLORIDE 0.9% FLUSH
3.0000 mL | Freq: Two times a day (BID) | INTRAVENOUS | Status: DC
  Administered 2019-09-28 – 2019-10-01 (×6): 3 mL via INTRAVENOUS

## 2019-09-28 MED ORDER — POTASSIUM CHLORIDE CRYS ER 20 MEQ PO TBCR
40.0000 meq | EXTENDED_RELEASE_TABLET | Freq: Two times a day (BID) | ORAL | Status: DC
  Administered 2019-09-28 – 2019-10-01 (×7): 40 meq via ORAL
  Filled 2019-09-28 (×7): qty 2

## 2019-09-28 MED ORDER — AMLODIPINE BESYLATE 5 MG PO TABS
5.0000 mg | ORAL_TABLET | Freq: Every day | ORAL | Status: DC
  Administered 2019-09-28 – 2019-10-01 (×4): 5 mg via ORAL
  Filled 2019-09-28 (×5): qty 1

## 2019-09-28 MED ORDER — ISOSORBIDE MONONITRATE ER 60 MG PO TB24
60.0000 mg | ORAL_TABLET | Freq: Every day | ORAL | Status: DC
  Administered 2019-09-28 – 2019-09-30 (×3): 60 mg via ORAL
  Filled 2019-09-28 (×3): qty 1

## 2019-09-28 MED ORDER — TAMOXIFEN CITRATE 10 MG PO TABS
20.0000 mg | ORAL_TABLET | Freq: Every day | ORAL | Status: DC
  Administered 2019-09-28 – 2019-10-01 (×4): 20 mg via ORAL
  Filled 2019-09-28 (×4): qty 2

## 2019-09-28 MED ORDER — ALLOPURINOL 300 MG PO TABS
300.0000 mg | ORAL_TABLET | Freq: Every day | ORAL | Status: DC
  Administered 2019-09-28 – 2019-10-01 (×4): 300 mg via ORAL
  Filled 2019-09-28 (×4): qty 1

## 2019-09-28 MED ORDER — PIPERACILLIN-TAZOBACTAM 3.375 G IVPB 30 MIN: 3.3750 g | Freq: Once | INTRAVENOUS | Status: DC

## 2019-09-28 MED ORDER — POTASSIUM CHLORIDE 10 MEQ/100ML IV SOLN
10.0000 meq | Freq: Once | INTRAVENOUS | Status: AC
  Administered 2019-09-28: 10 meq via INTRAVENOUS
  Filled 2019-09-28: qty 100

## 2019-09-28 MED ORDER — POLYETHYLENE GLYCOL 3350 17 G PO PACK
17.0000 g | PACK | Freq: Every day | ORAL | Status: DC | PRN
  Filled 2019-09-28: qty 1

## 2019-09-28 MED ORDER — ONDANSETRON HCL 4 MG/2ML IJ SOLN: 4.0000 mg | Freq: Four times a day (QID) | INTRAMUSCULAR | Status: DC | PRN

## 2019-09-28 MED ORDER — METOPROLOL SUCCINATE ER 100 MG PO TB24
100.0000 mg | ORAL_TABLET | Freq: Every day | ORAL | Status: DC
  Administered 2019-09-28 – 2019-10-01 (×4): 100 mg via ORAL
  Filled 2019-09-28 (×4): qty 1

## 2019-09-28 MED ORDER — SODIUM CHLORIDE 0.9 % IV SOLN
2.0000 g | INTRAVENOUS | Status: DC
  Administered 2019-09-28: 2 g via INTRAVENOUS
  Filled 2019-09-28: qty 20

## 2019-09-28 MED ORDER — METHOCARBAMOL 500 MG PO TABS
500.0000 mg | ORAL_TABLET | Freq: Three times a day (TID) | ORAL | Status: DC
  Administered 2019-09-28 – 2019-10-01 (×9): 500 mg via ORAL
  Filled 2019-09-28 (×10): qty 1

## 2019-09-28 MED ORDER — HYDROCODONE-ACETAMINOPHEN 5-325 MG PO TABS
1.0000 | ORAL_TABLET | ORAL | Status: DC | PRN
  Administered 2019-09-28 – 2019-10-01 (×4): 1 via ORAL
  Filled 2019-09-28 (×5): qty 1

## 2019-09-28 MED ORDER — VANCOMYCIN HCL IN DEXTROSE 1-5 GM/200ML-% IV SOLN
1000.0000 mg | Freq: Once | INTRAVENOUS | Status: AC
  Administered 2019-09-28: 1000 mg via INTRAVENOUS
  Filled 2019-09-28: qty 200

## 2019-09-28 MED ORDER — ENOXAPARIN SODIUM 40 MG/0.4ML ~~LOC~~ SOLN
40.0000 mg | SUBCUTANEOUS | Status: DC
  Administered 2019-09-28 – 2019-10-01 (×4): 40 mg via SUBCUTANEOUS
  Filled 2019-09-28 (×4): qty 0.4

## 2019-09-28 MED ORDER — ACETAMINOPHEN 325 MG PO TABS
650.0000 mg | ORAL_TABLET | Freq: Four times a day (QID) | ORAL | Status: DC | PRN
  Administered 2019-09-29: 650 mg via ORAL
  Filled 2019-09-28: qty 2

## 2019-09-28 MED ORDER — SODIUM CHLORIDE 0.9 % IV BOLUS (SEPSIS)
800.0000 mL | Freq: Once | INTRAVENOUS | Status: AC
  Administered 2019-09-28: 800 mL via INTRAVENOUS

## 2019-09-28 MED ORDER — METHADONE HCL 5 MG PO TABS
15.0000 mg | ORAL_TABLET | Freq: Three times a day (TID) | ORAL | Status: DC
  Administered 2019-09-28 – 2019-10-01 (×9): 15 mg via ORAL
  Filled 2019-09-28 (×11): qty 1

## 2019-09-28 MED ORDER — POTASSIUM CHLORIDE CRYS ER 20 MEQ PO TBCR
40.0000 meq | EXTENDED_RELEASE_TABLET | Freq: Once | ORAL | Status: AC
  Administered 2019-09-28: 40 meq via ORAL
  Filled 2019-09-28: qty 2

## 2019-09-28 MED ORDER — DOCUSATE SODIUM 100 MG PO CAPS
100.0000 mg | ORAL_CAPSULE | Freq: Two times a day (BID) | ORAL | Status: DC
  Administered 2019-09-28 – 2019-09-30 (×6): 100 mg via ORAL
  Filled 2019-09-28 (×7): qty 1

## 2019-09-28 MED ORDER — CAMPHOR-MENTHOL 0.5-0.5 % EX LOTN
TOPICAL_LOTION | Freq: Four times a day (QID) | CUTANEOUS | Status: DC | PRN
  Administered 2019-09-29: 13:00:00 via TOPICAL
  Filled 2019-09-28: qty 222

## 2019-09-28 MED ORDER — SODIUM CHLORIDE 0.9 % IV BOLUS (SEPSIS)
1000.0000 mL | Freq: Once | INTRAVENOUS | Status: AC
  Administered 2019-09-28: 1000 mL via INTRAVENOUS

## 2019-09-28 MED ORDER — HYDROCHLOROTHIAZIDE 25 MG PO TABS
25.0000 mg | ORAL_TABLET | Freq: Every day | ORAL | Status: DC
  Administered 2019-09-28 – 2019-10-01 (×4): 25 mg via ORAL
  Filled 2019-09-28 (×4): qty 1

## 2019-09-28 MED ORDER — MORPHINE SULFATE (PF) 4 MG/ML IV SOLN
4.0000 mg | Freq: Once | INTRAVENOUS | Status: AC
  Administered 2019-09-28: 4 mg via INTRAVENOUS
  Filled 2019-09-28: qty 1

## 2019-09-28 MED ORDER — ACETAMINOPHEN 325 MG PO TABS: 650.0000 mg | ORAL_TABLET | Freq: Four times a day (QID) | ORAL | Status: DC | PRN

## 2019-09-28 MED ORDER — ACETAMINOPHEN 650 MG RE SUPP: 650.0000 mg | Freq: Four times a day (QID) | RECTAL | Status: DC | PRN

## 2019-09-28 MED ORDER — ONDANSETRON HCL 4 MG PO TABS: 4.0000 mg | ORAL_TABLET | Freq: Four times a day (QID) | ORAL | Status: DC | PRN

## 2019-09-28 MED ORDER — ONDANSETRON HCL 4 MG/2ML IJ SOLN
4.0000 mg | Freq: Once | INTRAMUSCULAR | Status: AC
  Administered 2019-09-28: 4 mg via INTRAVENOUS
  Filled 2019-09-28: qty 2

## 2019-09-28 MED ORDER — SODIUM CHLORIDE 0.9 % IV SOLN
1.0000 g | Freq: Once | INTRAVENOUS | Status: AC
  Administered 2019-09-28: 1 g via INTRAVENOUS
  Filled 2019-09-28: qty 10
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
## 2019-09-28 NOTE — Progress Notes (Signed)
Per MD verbal order. The circumference of the patient's swollen left arm has been obtained and is 37 cm at the widest point on the left forearm. Will continue to monitor for additional swelling.

## 2019-09-28 NOTE — Progress Notes (Signed)
  Echocardiogram 2D Echocardiogram has been performed.  [name] R 09/28/2019, 2:00 PM
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
## 2019-09-28 NOTE — Progress Notes (Signed)
Upper extremity venous has been completed.   Preliminary results in CV Proc.   Abram Sander 09/28/2019 8:38 AM
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
## 2019-09-29 DIAGNOSIS — I220 Subsequent ST elevation (STEMI) myocardial infarction of anterior wall: Secondary | ICD-10-CM | POA: Diagnosis not present

## 2019-09-29 DIAGNOSIS — C50412 Malignant neoplasm of upper-outer quadrant of left female breast: Secondary | ICD-10-CM

## 2019-09-29 DIAGNOSIS — R600 Localized edema: Secondary | ICD-10-CM

## 2019-09-29 DIAGNOSIS — I2510 Atherosclerotic heart disease of native coronary artery without angina pectoris: Secondary | ICD-10-CM

## 2019-09-29 LAB — COMPREHENSIVE METABOLIC PANEL
ALT: 15 U/L (ref 0–44)
AST: 17 U/L (ref 15–41)
Albumin: 3 g/dL — ABNORMAL LOW (ref 3.5–5.0)
Alkaline Phosphatase: 82 U/L (ref 38–126)
Anion gap: 11 (ref 5–15)
BUN: 12 mg/dL (ref 6–20)
CO2: 22 mmol/L (ref 22–32)
Calcium: 8.5 mg/dL — ABNORMAL LOW (ref 8.9–10.3)
Chloride: 105 mmol/L (ref 98–111)
Creatinine, Ser: 0.87 mg/dL (ref 0.44–1.00)
GFR calc Af Amer: >60 mL/min (ref >60)
GFR calc non Af Amer: >60 mL/min (ref >60)
Glucose, Bld: 100 mg/dL — ABNORMAL HIGH (ref 70–99)
Potassium: 3.4 mmol/L — ABNORMAL LOW (ref 3.5–5.1)
Sodium: 138 mmol/L (ref 135–145)
Total Bilirubin: 0.3 mg/dL (ref 0.3–1.2)
Total Protein: 6 g/dL — ABNORMAL LOW (ref 6.5–8.1)

## 2019-09-29 LAB — CBC WITH DIFFERENTIAL/PLATELET
Abs Immature Granulocytes: 0.11 10*3/uL — ABNORMAL HIGH (ref 0.00–0.07)
Basophils Absolute: 0 10*3/uL (ref 0.0–0.1)
Basophils Relative: 0 %
Eosinophils Absolute: 0.2 10*3/uL (ref 0.0–0.5)
Eosinophils Relative: 1 %
HCT: 37.6 % (ref 36.0–46.0)
Hemoglobin: 12 g/dL (ref 12.0–15.0)
Immature Granulocytes: 1 %
Lymphocytes Relative: 17 %
Lymphs Abs: 2.2 10*3/uL (ref 0.7–4.0)
MCH: 28.4 pg (ref 26.0–34.0)
MCHC: 31.9 g/dL (ref 30.0–36.0)
MCV: 89.1 fL (ref 80.0–100.0)
Monocytes Absolute: 1 10*3/uL (ref 0.1–1.0)
Monocytes Relative: 8 %
Neutro Abs: 9.7 10*3/uL — ABNORMAL HIGH (ref 1.7–7.7)
Neutrophils Relative %: 73 %
Platelets: 353 10*3/uL (ref 150–400)
RBC: 4.22 MIL/uL (ref 3.87–5.11)
RDW: 16.3 % — ABNORMAL HIGH (ref 11.5–15.5)
WBC: 13.2 10*3/uL — ABNORMAL HIGH (ref 4.0–10.5)
nRBC: 0 % (ref 0.0–0.2)

## 2019-09-29 LAB — C-REACTIVE PROTEIN: CRP: 15.2 mg/dL — ABNORMAL HIGH (ref <1.0)

## 2019-09-29 LAB — MAGNESIUM: Magnesium: 2 mg/dL (ref 1.7–2.4)

## 2019-09-29 NOTE — Progress Notes (Signed)
[facility]                                                                              Patient Demographics  [name], is a 58 y.o. female, DOB - 03-May-1961, XKP:[id number]  Admit date - 09/28/2019   Admitting Physician Vianne Bulls, MD  Outpatient Primary MD for the patient is Winfrey, Alcario Drought, MD  Outpatient specialists:   LOS - 1  days   Medical records reviewed and are as summarized below:    Chief Complaint  Patient presents with  . Chest Pain  . Arm Swelling       Brief summary   Patient is a 59 year old female with bilateral breast CA, chronic left arm lymphedema, CAD status post PCI, chronic anemia, chronic pain on methadone, hypertension, hyperlipidemia, chronic leg edema presented with left arm pain and swelling.  Patient reported she was at her baseline until a day before the admission.  Initially started having itching on the left arm, progressed to swelling, pain and then noticed red streaks on her arm.  In ED she also reported some chest pain, sharp and pleuritic.  Also has chronic leg swelling, unchanged from baseline. Patient had a temp of 100.9 F, reported severe pain in left arm, and leukocytosis  Assessment & Plan    Principal Problem:   Left arm cellulitis, -Patient has chronic lymphedema and venous stasis, prone to injury and subsequent infection -Patient was placed on IV ceftriaxone.  ESR normal however elevated CRP -Recommended to elevate left arm  Chest pain with prior history of CAD Somewhat pleuritic in nature based on description, EKG unremarkable, serial troponins negative -Continue aspirin, Imdur, Lipitor, Toprol-XL.  Norvasc was decreased and HCTZ added due to chronic LE edema -2D echo reassuring, EF 60 to 65%  Chronic pain syndrome Continue chronic methadone  Bilateral breast CA Continue tamoxifen, outpatient follow-up with Dr. [name]  Morbid obesity BMI 40.9, counseled on diet and weight control     code Status: Full code DVT Prophylaxis:  Lovenox Family Communication: Discussed all imaging results, lab results, explained to the patient    Disposition Plan: Will observe next 24 to 48 hours if improving, may be able to DC home  Time Spent in minutes 35 minutes  Procedures:  None  Consultants:   None  Antimicrobials:   Anti-infectives (From admission, onward)   Start     Dose/Rate Route Frequency Ordered Stop   09/29/19 0200  cefTRIAXone (ROCEPHIN) 2 g in sodium chloride 0.9 % 100 mL IVPB     2 g 200 mL/hr over 30 Minutes Intravenous Every 24 hours 09/28/19 0805     09/28/19 0600  cefTRIAXone (ROCEPHIN) 2 g in sodium chloride 0.9 % 100 mL IVPB  Status:  Discontinued     2 g 200 mL/hr over 30 Minutes Intravenous Every 24 hours 09/28/19 0516 09/28/19 0805   09/28/19 0145  piperacillin-tazobactam (ZOSYN) IVPB 3.375 g  Status:  Discontinued     3.375 g 100 mL/hr over 30 Minutes Intravenous  Once 09/28/19 0135 09/28/19 0138   09/28/19 0145  cefTRIAXone (ROCEPHIN) 1 g in sodium chloride 0.9 % 100  mL IVPB     1 g 200 mL/hr over 30 Minutes Intravenous  Once 09/28/19 0139 09/28/19 0247   09/28/19 0145  vancomycin (VANCOCIN) IVPB 1000 mg/200 mL premix     1,000 mg 200 mL/hr over 60 Minutes Intravenous  Once 09/28/19 0139 09/28/19 0347          Medications  Scheduled Meds: . allopurinol  300 mg Oral Daily  . amLODipine  5 mg Oral Daily  . aspirin EC  81 mg Oral QHS  . atorvastatin  40 mg Oral q1800  . docusate sodium  100 mg Oral BID  . enoxaparin (LOVENOX) injection  40 mg Subcutaneous Q24H  . hydrochlorothiazide  25 mg Oral Daily  . isosorbide mononitrate  60 mg Oral QHS  . methadone  15 mg Oral Q8H  . methocarbamol  500 mg Oral TID  . metoprolol succinate  100 mg Oral Daily  . multivitamin with minerals  1 tablet Oral QHS  . potassium chloride  40 mEq Oral BID  . sodium chloride flush  3 mL Intravenous Q12H  . tamoxifen  20 mg Oral Daily   Continuous Infusions:  . cefTRIAXone (ROCEPHIN)  IV 2 g (09/29/19 0118)   PRN Meds:.acetaminophen **OR** acetaminophen, camphor-menthol, HYDROcodone-acetaminophen, ondansetron **OR** ondansetron (ZOFRAN) IV, polyethylene glycol      Subjective:   Oma Marzan was seen and examined today.  States pain in the left arm is improving, swelling and redness improving.  Still low-grade temp 100 F.  Patient denies dizziness,  shortness of breath, abdominal pain, N/V/D/C, new weakness, numbess, tingling. No acute events overnight.  Chest pain resolved.  Objective:   Vitals:   09/28/19 2049 09/29/19 0500 09/29/19 0546 09/29/19 0736  BP: 128/81  118/70 132/70  Pulse: 79  74 74  Resp: '18  20 20  ' Temp: 100 F (37.8 C)  99.1 F (37.3 C) 98.5 F (36.9 C)  TempSrc: Oral  Oral Oral  SpO2: 94%  92% 90%  Weight:  111.6 kg    Height:        Intake/Output Summary (Last 24 hours) at 09/29/2019 1311 Last data filed at 09/29/2019 1219 Gross per 24 hour  Intake 1253 ml  Output 3200 ml  Net -1947 ml     Wt Readings from Last 3 Encounters:  09/29/19 111.6 kg  07/25/19 108 kg  07/24/19 107.5 kg     Exam  General: Alert and oriented x 3, NAD  Eyes: ,  HEENT:  Atraumatic, normocephalic, normal oropharynx  Cardiovascular: S1 S2 auscultated, no murmurs, RRR  Respiratory: Clear to auscultation bilaterally, no wheezing, rales or rhonchi  Gastrointestinal: Soft, nontender, nondistended, + bowel sounds  Ext: Bilateral pedal edema   Neuro: No new deficit  Musculoskeletal: No digital cyanosis, clubbing  Skin: Left arm swelling, bilateral pedal edema,  Psych: Normal affect and demeanor, alert and oriented x3    Data Reviewed:  I have personally reviewed following labs and imaging studies  Micro Results Recent Results (from the past 240 hour(s))  Blood culture (routine x 2)     Status: None (Preliminary result)   Collection Time: 09/28/19  2:08 AM   Specimen: BLOOD  Result Value Ref Range Status    Specimen Description   Final    BLOOD RIGHT ANTECUBITAL Performed at [facility], [address] [place], [place] [address]    Special Requests   Final    BOTTLES DRAWN AEROBIC ONLY Blood Culture adequate volume Performed at [facility], [place] [address], [place], [place] [address]    Culture   Final    NO GROWTH < 12 HOURS Performed at [facility] [address] [place], [place] [address]    Report Status PENDING  Incomplete  Blood culture (routine x 2)     Status: None (Preliminary result)   Collection Time: 09/28/19  2:08 AM   Specimen: BLOOD RIGHT FOREARM  Result Value Ref Range Status   Specimen Description   Final    BLOOD RIGHT FOREARM Performed at [facility], [place] [address], [place], [place] [address]    Special Requests   Final    BOTTLES DRAWN AEROBIC AND ANAEROBIC Blood Culture adequate volume Performed at [facility] [address], [place], [place] [address]    Culture   Final    NO GROWTH < 12 HOURS Performed at [facility] [address] [place], [place] [address]    Report Status PENDING  Incomplete  SARS CORONAVIRUS 2 (TAT 6-24 HRS) Nasopharyngeal Nasopharyngeal Swab     Status: None   Collection Time: 09/28/19  2:08 AM   Specimen: Nasopharyngeal Swab  Result Value Ref Range Status   SARS Coronavirus 2 NEGATIVE NEGATIVE Final    Comment: (NOTE) SARS-CoV-2 target nucleic acids are NOT DETECTED. The SARS-CoV-2 RNA is generally detectable in upper and lower respiratory specimens during the acute phase of infection. Negative results do not preclude SARS-CoV-2 infection, do not rule out co-infections with other pathogens, and should not be used as the sole basis for treatment or other patient management decisions. Negative results must be combined with clinical observations, patient history, and epidemiological information. The expected result is Negative. Fact Sheet for Patients:  SugarRoll.be Fact Sheet for Healthcare Providers: https://www.woods-mathews.com/ This test is not yet approved or cleared by the [place] FDA and  has been authorized for detection and/or diagnosis of SARS-CoV-2 by FDA under an Emergency Use Authorization (EUA). This EUA will remain  in effect (meaning this test can be used) for the duration of the COVID-19 declaration under Section 56 4(b)(1) of the Act, 21 U.S.C. section 360bbb-3(b)(1), unless the authorization is terminated or revoked sooner. Performed at [facility], [place] [address], [place], [place] [address]     Radiology Reports Dg Chest 2 View  Result Date: 09/27/2019 CLINICAL DATA:  Mid chest pain, fever EXAM: CHEST - 2 VIEW COMPARISON:  07/13/2019 FINDINGS: Subsegmental atelectasis the bases. No pleural effusion. Normal heart size. No pneumothorax. Clips in the left axilla. IMPRESSION: No active cardiopulmonary disease. Subsegmental atelectasis at the bases Electronically Signed   By: Donavan Foil M.D.   On: 09/27/2019 22:30   Dg Forearm Left  Result Date: 09/28/2019 CLINICAL DATA:  Left arm pain and cellulitis. No known injury. EXAM: LEFT FOREARM - 2 VIEW COMPARISON:  Radiographs 07/13/2019. FINDINGS: The mineralization and alignment are normal. There is no evidence of acute fracture or dislocation. Stable mild degenerative changes at the 1st carpometacarpal articulation. Diffuse soft tissue edema is again noted, similar to the previous study. No evidence of radiopaque foreign body or soft tissue emphysema. IMPRESSION: No acute osseous findings or evidence of osteomyelitis. Stable diffuse soft tissue edema, nonspecific. Electronically Signed   By: Richardean Sale M.D.   On: 09/28/2019 10:26   Ue Venous Duplex ([facility] Only)  Result Date: 09/28/2019 UPPER VENOUS STUDY  Indications: Swelling, Edema, and Pain Comparison Study: previous study done 07/14/19 Performing Technologist:  Jinny Blossom  [facility]  Examination Guidelines: A complete evaluation includes B-mode imaging, spectral Doppler, color Doppler, and power Doppler as needed of all accessible portions of each vessel. Bilateral testing is considered an integral part of a complete examination. Limited examinations for reoccurring indications may be performed as noted.  Right Findings: +----------+------------+---------+-----------+----------+--------------+ RIGHT     CompressiblePhasicitySpontaneousProperties   Summary     +----------+------------+---------+-----------+----------+--------------+ Subclavian                                          Not visualized +----------+------------+---------+-----------+----------+--------------+  Left Findings: +----------+------------+---------+-----------+----------+-------+ LEFT      CompressiblePhasicitySpontaneousPropertiesSummary +----------+------------+---------+-----------+----------+-------+ IJV           Full       Yes       Yes                      +----------+------------+---------+-----------+----------+-------+ Subclavian    Full       Yes       Yes                      +----------+------------+---------+-----------+----------+-------+ Axillary      Full       Yes       Yes                      +----------+------------+---------+-----------+----------+-------+ Brachial      Full       Yes       Yes                      +----------+------------+---------+-----------+----------+-------+ Radial        Full                                          +----------+------------+---------+-----------+----------+-------+ Ulnar         Full                                          +----------+------------+---------+-----------+----------+-------+ Cephalic      Full                                          +----------+------------+---------+-----------+----------+-------+ Basilic       Full                                           +----------+------------+---------+-----------+----------+-------+  Summary:  Left: No evidence of deep vein thrombosis in the upper extremity. No evidence of superficial vein thrombosis in the upper extremity.  *See table(s) above for measurements and observations.    Preliminary     Lab Data:  CBC: Recent Labs  Lab 09/27/19 2200 09/29/19 0538  WBC 18.9* 13.2*  NEUTROABS  --  9.7*  HGB 13.5 12.0  HCT 41.2 37.6  MCV 86.7 89.1  PLT 429* 300   Basic Metabolic Panel: Recent Labs  Lab 09/27/19 2200 09/28/19 0559 09/29/19 0538  NA 139 138 138  K 2.9* 3.4* 3.4*  CL 110 108 105  CO2 19* 18* 22  GLUCOSE 153* 119* 100*  BUN '12 10 12  ' CREATININE 0.90 0.81 0.87  CALCIUM 8.6* 8.2* 8.5*  MG  --  1.8 2.0   GFR: Estimated Creatinine Clearance: 90.9 mL/min (by C-G formula based on SCr of 0.87 mg/dL). Liver Function Tests: Recent Labs  Lab 09/29/19 0538  AST 17  ALT 15  ALKPHOS 82  BILITOT 0.3  PROT 6.0*  ALBUMIN 3.0*   No results for input(s): LIPASE, AMYLASE in the last 168 hours. No results for input(s): AMMONIA in the last 168 hours. Coagulation Profile: No results for input(s): INR, PROTIME in the last 168 hours. Cardiac Enzymes: Recent Labs  Lab 09/28/19 0842  CKTOTAL 79   BNP (last 3 results) No results for input(s): PROBNP in the last 8760 hours. HbA1C: No results for input(s): HGBA1C in the last 72 hours. CBG: No results for input(s): GLUCAP in the last 168 hours. Lipid Profile: No results for input(s): CHOL, HDL, LDLCALC, TRIG, CHOLHDL, LDLDIRECT in the last 72 hours. Thyroid Function Tests: No results for input(s): TSH, T4TOTAL, FREET4, T3FREE, THYROIDAB in the last 72 hours. Anemia Panel: No results for input(s): VITAMINB12, FOLATE, FERRITIN, TIBC, IRON, RETICCTPCT in the last 72 hours. Urine analysis:    Component Value Date/Time   COLORURINE YELLOW 03/28/2012 0452   APPEARANCEUR CLOUDY (A) 03/28/2012 0452   LABSPEC 1.015 09/18/2012 1358    PHURINE 6.0 09/18/2012 1358   PHURINE 6.0 03/28/2012 0452   GLUCOSEU NEGATIVE 03/28/2012 0452   HGBUR Negative 09/18/2012 1358   HGBUR NEGATIVE 03/28/2012 0452   BILIRUBINUR NEG 01/26/2017 1012   BILIRUBINUR Negative 09/18/2012 1358   KETONESUR Negative 09/18/2012 1358   KETONESUR NEGATIVE 03/28/2012 0452   PROTEINUR NEG 01/26/2017 1012   PROTEINUR Negative 09/18/2012 1358   PROTEINUR NEGATIVE 03/28/2012 0452   UROBILINOGEN 0.2 01/26/2017 1012   UROBILINOGEN 0.2 03/28/2012 0452   NITRITE NEG 01/26/2017 1012   NITRITE Negative 09/18/2012 1358   NITRITE NEGATIVE 03/28/2012 0452   LEUKOCYTESUR Negative 01/26/2017 1012   LEUKOCYTESUR Negative 09/18/2012 1358       M.D. [facility] 09/29/2019, 1:11 PM  Pager: [phone] Between 7am to 7pm - call Pager - [phone]  After 7pm go to [email or web address] - password [email or web address]  Call night coverage person covering after 7pm

## 2019-09-30 ENCOUNTER — Encounter (HOSPITAL_COMMUNITY): Payer: Self-pay | Admitting: *Deleted

## 2019-09-30 DIAGNOSIS — I220 Subsequent ST elevation (STEMI) myocardial infarction of anterior wall: Secondary | ICD-10-CM | POA: Diagnosis not present

## 2019-09-30 LAB — BASIC METABOLIC PANEL
Anion gap: 10 (ref 5–15)
BUN: 12 mg/dL (ref 6–20)
CO2: 23 mmol/L (ref 22–32)
Calcium: 8.4 mg/dL — ABNORMAL LOW (ref 8.9–10.3)
Chloride: 106 mmol/L (ref 98–111)
Creatinine, Ser: 0.78 mg/dL (ref 0.44–1.00)
GFR calc Af Amer: >60 mL/min (ref >60)
GFR calc non Af Amer: >60 mL/min (ref >60)
Glucose, Bld: 100 mg/dL — ABNORMAL HIGH (ref 70–99)
Potassium: 3.4 mmol/L — ABNORMAL LOW (ref 3.5–5.1)
Sodium: 139 mmol/L (ref 135–145)

## 2019-09-30 LAB — CBC WITH DIFFERENTIAL/PLATELET
Abs Immature Granulocytes: 0.12 10*3/uL — ABNORMAL HIGH (ref 0.00–0.07)
Basophils Absolute: 0 10*3/uL (ref 0.0–0.1)
Basophils Relative: 0 %
Eosinophils Absolute: 0.2 10*3/uL (ref 0.0–0.5)
Eosinophils Relative: 2 %
HCT: 38.8 % (ref 36.0–46.0)
Hemoglobin: 12.4 g/dL (ref 12.0–15.0)
Immature Granulocytes: 1 %
Lymphocytes Relative: 21 %
Lymphs Abs: 2.1 10*3/uL (ref 0.7–4.0)
MCH: 28.5 pg (ref 26.0–34.0)
MCHC: 32 g/dL (ref 30.0–36.0)
MCV: 89.2 fL (ref 80.0–100.0)
Monocytes Absolute: 0.7 10*3/uL (ref 0.1–1.0)
Monocytes Relative: 7 %
Neutro Abs: 6.9 10*3/uL (ref 1.7–7.7)
Neutrophils Relative %: 69 %
Platelets: 397 10*3/uL (ref 150–400)
RBC: 4.35 MIL/uL (ref 3.87–5.11)
RDW: 16.1 % — ABNORMAL HIGH (ref 11.5–15.5)
WBC: 10.1 10*3/uL (ref 4.0–10.5)
nRBC: 0 % (ref 0.0–0.2)

## 2019-09-30 LAB — C-REACTIVE PROTEIN: CRP: 8.8 mg/dL — ABNORMAL HIGH (ref <1.0)

## 2019-09-30 NOTE — Progress Notes (Signed)
[facility]                                                                              Patient Demographics  [name], is a 58 y.o. female, DOB - [birth year], PYY:[id number]  Admit date - 09/28/2019   Admitting Physician Vianne Bulls, MD  Outpatient Primary MD for the patient is Winfrey, Alcario Drought, MD  Outpatient specialists:   LOS - 2  days   Medical records reviewed and are as summarized below:    Chief Complaint  Patient presents with   Chest Pain   Arm Swelling       Brief summary   Patient is a 58 year old female with bilateral breast CA, chronic left arm lymphedema, CAD status post PCI, chronic anemia, chronic pain on methadone, hypertension, hyperlipidemia, chronic leg edema presented with left arm pain and swelling.  Patient reported she was at her baseline until a day before the admission.  Initially started having itching on the left arm, progressed to swelling, pain and then noticed red streaks on her arm.  In ED she also reported some chest pain, sharp and pleuritic.  Also has chronic leg swelling, unchanged from baseline. Patient had a temp of 100.9 F, reported severe pain in left arm, and leukocytosis  Assessment & Plan    Principal Problem:   Left arm cellulitis, - Patient has chronic lymphedema and venous stasis, prone to injury and subsequent infection - Patient was placed on IV ceftriaxone.  ESR normal however elevated CRP - CRP improving, leukocytosis improving.  Still has pain in the left arm with tenderness but no fevers - Continue IV ceftriaxone today, will transition to oral antibiotics in a.m. for discharge - venous doppler LUE negative for DVT   Chest pain with prior history of CAD Somewhat pleuritic in nature based on description, EKG unremarkable, serial troponins negative -Continue aspirin, Imdur, Lipitor, Toprol-XL.  Norvasc was decreased and HCTZ added due to chronic LE edema -2D echo reassuring, EF 60 to  65%  Chronic pain syndrome Continue methadone   Bilateral breast CA Continue tamoxifen, outpatient follow-up with Dr. [name]  Morbid obesity BMI 40.9, counseled on diet and weight control    code Status: Full code DVT Prophylaxis:  Lovenox Family Communication: Discussed all imaging results, lab results, explained to the patient    Disposition Plan: possible DC in am, cont IV abx today   Time Spent in minutes 25 minutes  Procedures:  None  Consultants:   None  Antimicrobials:   Anti-infectives (From admission, onward)   Start     Dose/Rate Route Frequency Ordered Stop   09/29/19 0200  cefTRIAXone (ROCEPHIN) 2 g in sodium chloride 0.9 % 100 mL IVPB     2 g 200 mL/hr over 30 Minutes Intravenous Every 24 hours 09/28/19 0805     09/28/19 0600  cefTRIAXone (ROCEPHIN) 2 g in sodium chloride 0.9 % 100 mL IVPB  Status:  Discontinued     2 g 200 mL/hr over 30 Minutes Intravenous Every 24 hours 09/28/19 0516 09/28/19 0805   09/28/19 0145  piperacillin-tazobactam (ZOSYN) IVPB 3.375 g  Status:  Discontinued  3.375 g 100 mL/hr over 30 Minutes Intravenous  Once 09/28/19 0135 09/28/19 0138   09/28/19 0145  cefTRIAXone (ROCEPHIN) 1 g in sodium chloride 0.9 % 100 mL IVPB     1 g 200 mL/hr over 30 Minutes Intravenous  Once 09/28/19 0139 09/28/19 0247   09/28/19 0145  vancomycin (VANCOCIN) IVPB 1000 mg/200 mL premix     1,000 mg 200 mL/hr over 60 Minutes Intravenous  Once 09/28/19 0139 09/28/19 0347         Medications  Scheduled Meds:  allopurinol  300 mg Oral Daily   amLODipine  5 mg Oral Daily   aspirin EC  81 mg Oral QHS   atorvastatin  40 mg Oral q1800   docusate sodium  100 mg Oral BID   enoxaparin (LOVENOX) injection  40 mg Subcutaneous Q24H   hydrochlorothiazide  25 mg Oral Daily   isosorbide mononitrate  60 mg Oral QHS   methadone  15 mg Oral Q8H   methocarbamol  500 mg Oral TID   metoprolol succinate  100 mg Oral Daily   multivitamin with  minerals  1 tablet Oral QHS   potassium chloride  40 mEq Oral BID   sodium chloride flush  3 mL Intravenous Q12H   tamoxifen  20 mg Oral Daily   Continuous Infusions:  cefTRIAXone (ROCEPHIN)  IV 2 g (09/30/19 0122)   PRN Meds:.acetaminophen **OR** acetaminophen, camphor-menthol, HYDROcodone-acetaminophen, ondansetron **OR** ondansetron (ZOFRAN) IV, polyethylene glycol      Subjective:   [name] was seen and examined today.  States pain in left arm is still there, no worsening in swelling. No fevers.   Patient denies dizziness,  shortness of breath, abdominal pain, N/V/D/C, new weakness, numbess, tingling. No acute events overnight.  Chest pain resolved.  Objective:   Vitals:   09/29/19 0736 09/29/19 1419 09/29/19 2036 09/30/19 0547  BP: 132/70 134/78 109/85 133/79  Pulse: 74 71 70 69  Resp: _0 Temp: 98.5 F (36.9 C) 98 F (36.7 C) 98.2 F (36.8 C) 98.9 F (37.2 C)  TempSrc: Oral Oral Oral Oral  SpO2: 90% 92% 91% 94%  Weight:      Height:        Intake/Output Summary (Last 24 hours) at 09/30/2019 1300 Last data filed at 09/30/2019 1151 Gross per 24 hour  Intake 460 ml  Output 3000 ml  Net -2540 ml     Wt Readings from Last 3 Encounters:  09/29/19 111.6 kg  07/25/19 108 kg  07/24/19 107.5 kg    Physical Exam  General: Alert and oriented x 3, NAD  Eyes:  HEENT:  Atraumatic, normocephalic  Cardiovascular: S1 S2 clear, no murmurs, RRR. No pedal edema b/l  Respiratory: CTAB, no wheezing, rales or rhonchi  Gastrointestinal: Soft, nontender, nondistended, NBS  Ext: no pedal edema bilaterally  Neuro: no new deficits  Musculoskeletal: No cyanosis, clubbing  Skin: Left arm swelling improving  Psych: Normal affect and demeanor, alert and oriented x3      Data Reviewed:  I have personally reviewed following labs and imaging studies  Micro Results Recent Results (from the past 240 hour(s))  Blood culture (routine x 2)     Status:  None (Preliminary result)   Collection Time: 09/28/19  2:08 AM   Specimen: BLOOD  Result Value Ref Range Status   Specimen Description   Final    BLOOD RIGHT ANTECUBITAL Performed at [facility], [address] [place], [place] [address]  Special Requests   Final    BOTTLES DRAWN AEROBIC ONLY Blood Culture adequate volume Performed at [facility] [address], [place], [place] [address]    Culture   Final    NO GROWTH 1 DAY Performed at [facility], [place] [address], [place], [place] [address]    Report Status PENDING  Incomplete  Blood culture (routine x 2)     Status: None (Preliminary result)   Collection Time: 09/28/19  2:08 AM   Specimen: BLOOD RIGHT FOREARM  Result Value Ref Range Status   Specimen Description   Final    BLOOD RIGHT FOREARM Performed at [facility], [place] [address], [place], [place] [address]    Special Requests   Final    BOTTLES DRAWN AEROBIC AND ANAEROBIC Blood Culture adequate volume Performed at [place] [facility] [address], [place], [place] [address]    Culture   Final    NO GROWTH 1 DAY Performed at [facility], [place] [address], [place], [place] [address]    Report Status PENDING  Incomplete  SARS CORONAVIRUS 2 (TAT 6-24 HRS) Nasopharyngeal Nasopharyngeal Swab     Status: None   Collection Time: 09/28/19  2:08 AM   Specimen: Nasopharyngeal Swab  Result Value Ref Range Status   SARS Coronavirus 2 NEGATIVE NEGATIVE Final    Comment: (NOTE) SARS-CoV-2 target nucleic acids are NOT DETECTED. The SARS-CoV-2 RNA is generally detectable in upper and lower respiratory specimens during the acute phase of infection. Negative results do not preclude SARS-CoV-2 infection, do not rule out co-infections with other pathogens, and should not be used as the sole basis for treatment or other patient management decisions. Negative results must be combined with clinical observations, patient  history, and epidemiological information. The expected result is Negative. Fact Sheet for Patients: SugarRoll.be Fact Sheet for Healthcare Providers: https://www.woods-mathews.com/ This test is not yet approved or cleared by the [place] FDA and  has been authorized for detection and/or diagnosis of SARS-CoV-2 by FDA under an Emergency Use Authorization (EUA). This EUA will remain  in effect (meaning this test can be used) for the duration of the COVID-19 declaration under Section 56 4(b)(1) of the Act, 21 U.S.C. section 360bbb-3(b)(1), unless the authorization is terminated or revoked sooner. Performed at [facility], [place] [address] [place],  [address]     Radiology Reports Dg Chest 2 View  Result Date: 09/27/2019 CLINICAL DATA:  Mid chest pain, fever EXAM: CHEST - 2 VIEW COMPARISON:  07/13/2019 FINDINGS: Subsegmental atelectasis the bases. No pleural effusion. Normal heart size. No pneumothorax. Clips in the left axilla. IMPRESSION: No active cardiopulmonary disease. Subsegmental atelectasis at the bases Electronically Signed   By: Donavan Foil M.D.   On: 09/27/2019 22:30   Dg Forearm Left  Result Date: 09/28/2019 CLINICAL DATA:  Left arm pain and cellulitis. No known injury. EXAM: LEFT FOREARM - 2 VIEW COMPARISON:  Radiographs 07/13/2019. FINDINGS: The mineralization and alignment are normal. There is no evidence of acute fracture or dislocation. Stable mild degenerative changes at the 1st carpometacarpal articulation. Diffuse soft tissue edema is again noted, similar to the previous study. No evidence of radiopaque foreign body or soft tissue emphysema. IMPRESSION: No acute osseous findings or evidence of osteomyelitis. Stable diffuse soft tissue edema, nonspecific. Electronically Signed   By: Richardean Sale M.D.   On: 09/28/2019 10:26   Ue Venous Duplex ([facility] Only)  Result Date: 09/30/2019  UPPER VENOUS STUDY   Indications: Swelling, Edema, and Pain Comparison Study: previous study done 07/14/19 Performing Technologist: Abram Sander RVS  Examination Guidelines: A complete evaluation includes B-mode imaging, spectral Doppler, color Doppler, and power Doppler as needed of all accessible portions of each vessel. Bilateral testing is considered an integral part of a complete examination. Limited examinations for reoccurring indications may be performed as noted.  Right Findings: +----------+------------+---------+-----------+----------+--------------+  RIGHT      Compressible Phasicity Spontaneous Properties    Summary      +----------+------------+---------+-----------+----------+--------------+  Subclavian                                               Not visualized  +----------+------------+---------+-----------+----------+--------------+  Left Findings: +----------+------------+---------+-----------+----------+-------+  LEFT       Compressible Phasicity Spontaneous Properties Summary  +----------+------------+---------+-----------+----------+-------+  IJV            Full        Yes        Yes                         +----------+------------+---------+-----------+----------+-------+  Subclavian     Full        Yes        Yes                         +----------+------------+---------+-----------+----------+-------+  Axillary       Full        Yes        Yes                         +----------+------------+---------+-----------+----------+-------+  Brachial       Full        Yes        Yes                         +----------+------------+---------+-----------+----------+-------+  Radial         Full                                               +----------+------------+---------+-----------+----------+-------+  Ulnar          Full                                               +----------+------------+---------+-----------+----------+-------+  Cephalic       Full                                                +----------+------------+---------+-----------+----------+-------+  Basilic        Full                                               +----------+------------+---------+-----------+----------+-------+  Summary:  Left: No evidence of deep vein thrombosis in the upper extremity. No evidence of superficial vein thrombosis in the upper extremity.  *See table(s) above for measurements and observations.  Diagnosing physician: Ruta Hinds MD Electronically signed by Ruta Hinds MD on 09/30/2019 at 12:35:52 PM.    Final     Lab Data:  CBC: Recent Labs  Lab 09/27/19 2200 09/29/19 0538 09/30/19 0528  WBC 18.9* 13.2* 10.1  NEUTROABS  --  9.7* 6.9  HGB 13.5 12.0 12.4  HCT 41.2 37.6 38.8  MCV 86.7 89.1 89.2  PLT 429* 353 683   Basic Metabolic Panel: Recent Labs  Lab 09/27/19 2200 09/28/19 0559 09/29/19 0538 09/30/19 0611  NA 139 138 138 139  K 2.9* 3.4* 3.4* 3.4*  CL 110 108 105 106  CO2 19* 18* 22 23  GLUCOSE 153* 119* 100* 100*  BUN _0 CREATININE 0.90 0.81 0.87 0.78  CALCIUM 8.6* 8.2* 8.5* 8.4*  MG  --  1.8 2.0  --    GFR: Estimated Creatinine Clearance: 98.8 mL/min (by C-G formula based on SCr of 0.78 mg/dL). Liver Function Tests: Recent Labs  Lab 09/29/19 0538  AST 17  ALT 15  ALKPHOS 82  BILITOT 0.3  PROT 6.0*  ALBUMIN 3.0*   No results for input(s): LIPASE, AMYLASE in the last 168 hours. No results for input(s): AMMONIA in the last 168 hours. Coagulation Profile: No results for input(s): INR, PROTIME in the last 168 hours. Cardiac Enzymes: Recent Labs  Lab 09/28/19 0842  CKTOTAL 79   BNP (last 3 results) No results for input(s): PROBNP in the last 8760 hours. HbA1C: No results for input(s): HGBA1C in the last 72 hours. CBG: No results for input(s): GLUCAP in the last 168 hours. Lipid Profile: No results for input(s): CHOL, HDL, LDLCALC, TRIG, CHOLHDL, LDLDIRECT in the last 72 hours. Thyroid Function Tests: No results for input(s): TSH,  T4TOTAL, FREET4, T3FREE, THYROIDAB in the last 72 hours. Anemia Panel: No results for input(s): VITAMINB12, FOLATE, FERRITIN, TIBC, IRON, RETICCTPCT in the last 72 hours. Urine analysis:    Component Value Date/Time   COLORURINE YELLOW 03/28/2012 0452   APPEARANCEUR CLOUDY (A) 03/28/2012 0452   LABSPEC 1.015 09/18/2012 1358   PHURINE 6.0 09/18/2012 1358   PHURINE 6.0 03/28/2012 0452   GLUCOSEU NEGATIVE 03/28/2012 0452   HGBUR Negative 09/18/2012 1358   HGBUR NEGATIVE 03/28/2012 0452   BILIRUBINUR NEG 01/26/2017 1012   BILIRUBINUR Negative 09/18/2012 1358   KETONESUR Negative 09/18/2012 1358   KETONESUR NEGATIVE 03/28/2012 0452   PROTEINUR NEG 01/26/2017 1012   PROTEINUR Negative 09/18/2012 1358   PROTEINUR NEGATIVE 03/28/2012 0452   UROBILINOGEN 0.2 01/26/2017 1012   UROBILINOGEN 0.2 03/28/2012 0452   NITRITE NEG 01/26/2017 1012   NITRITE Negative 09/18/2012 1358   NITRITE NEGATIVE 03/28/2012 0452   LEUKOCYTESUR Negative 01/26/2017 1012   LEUKOCYTESUR Negative 09/18/2012 1358     Shamell Hittle M.D. [facility] 09/30/2019, 1:00 PM  Pager: [phone] Between 7am to 7pm - call Pager - [phone]  After 7pm go to [email or web address] - password [email or web address]  Call night coverage person covering after 7pm

## 2019-10-01 ENCOUNTER — Ambulatory Visit: Payer: Medicaid Other | Admitting: Family Medicine

## 2019-10-01 DIAGNOSIS — I220 Subsequent ST elevation (STEMI) myocardial infarction of anterior wall: Secondary | ICD-10-CM | POA: Diagnosis not present

## 2019-10-01 LAB — CBC WITH DIFFERENTIAL/PLATELET
Abs Immature Granulocytes: 0.15 10*3/uL — ABNORMAL HIGH (ref 0.00–0.07)
Basophils Absolute: 0.1 10*3/uL (ref 0.0–0.1)
Basophils Relative: 1 %
Eosinophils Absolute: 0.2 10*3/uL (ref 0.0–0.5)
Eosinophils Relative: 3 %
HCT: 41 % (ref 36.0–46.0)
Hemoglobin: 13.1 g/dL (ref 12.0–15.0)
Immature Granulocytes: 2 %
Lymphocytes Relative: 23 %
Lymphs Abs: 2 10*3/uL (ref 0.7–4.0)
MCH: 28.1 pg (ref 26.0–34.0)
MCHC: 32 g/dL (ref 30.0–36.0)
MCV: 88 fL (ref 80.0–100.0)
Monocytes Absolute: 0.7 10*3/uL (ref 0.1–1.0)
Monocytes Relative: 8 %
Neutro Abs: 5.5 10*3/uL (ref 1.7–7.7)
Neutrophils Relative %: 63 %
Platelets: 436 10*3/uL — ABNORMAL HIGH (ref 150–400)
RBC: 4.66 MIL/uL (ref 3.87–5.11)
RDW: 15.8 % — ABNORMAL HIGH (ref 11.5–15.5)
WBC: 8.6 10*3/uL (ref 4.0–10.5)
nRBC: 0 % (ref 0.0–0.2)

## 2019-10-01 LAB — C-REACTIVE PROTEIN: CRP: 4.4 mg/dL — ABNORMAL HIGH (ref <1.0)

## 2019-10-01 MED ORDER — HYDROCHLOROTHIAZIDE 25 MG PO TABS: 25.0000 mg | ORAL_TABLET | Freq: Every day | ORAL | 3 refills | Status: DC

## 2019-10-01 MED ORDER — AMLODIPINE BESYLATE 5 MG PO TABS: 5.0000 mg | ORAL_TABLET | Freq: Every day | ORAL | 3 refills | Status: DC

## 2019-10-01 MED ORDER — CEPHALEXIN 500 MG PO CAPS: 500.0000 mg | ORAL_CAPSULE | Freq: Four times a day (QID) | ORAL | 0 refills | Status: AC

## 2019-10-01 MED ORDER — METHOCARBAMOL 500 MG PO TABS: 500.0000 mg | ORAL_TABLET | Freq: Three times a day (TID) | ORAL | 3 refills | Status: DC

## 2019-10-01 NOTE — Progress Notes (Signed)
Left Arm Circumference of th left arm measured and is 35 cm at the widest point of the left foreman.

## 2019-10-01 NOTE — Discharge Summary (Signed)
Physician Discharge Summary   Patient ID: [name] MRN: [id number] DOB/AGE: 1961-04-27 58 y.o.  Admit date: 09/28/2019 Discharge date: 10/01/2019  Primary Care Physician:  [name], MD   Recommendations for Outpatient Follow-up:  1. Follow up with PCP in 1-2 weeks  Home Health: None  Equipment/Devices: Cane  Discharge Condition: stable  CODE STATUS: FULL Diet recommendation: Heart healthy diet   Discharge Diagnoses:    . Left arm cellulitis . Dyslipidemia . Atypical chest pain, chronic pain . Breast cancer of upper-outer quadrant of left female breast ([place]) . CAD (coronary artery disease) . Bilateral leg edema chronic . Morbid obesity   Consults: None    Allergies:  No Known Allergies   DISCHARGE MEDICATIONS: Allergies as of 10/01/2019   No Known Allergies     Medication List    TAKE these medications   acetaminophen 500 MG tablet Commonly known as: TYLENOL Take 2 tablets (1,000 mg total) by mouth every 8 (eight) hours as needed. What changed: reasons to take this   allopurinol 300 MG tablet Commonly known as: ZYLOPRIM Take 1 tablet (300 mg total) by mouth daily.   amLODipine 5 MG tablet Commonly known as: NORVASC Take 1 tablet (5 mg total) by mouth daily. What changed:   medication strength  how much to take   aspirin EC 81 MG tablet Take 81 mg by mouth at bedtime.   atorvastatin 40 MG tablet Commonly known as: LIPITOR Take 1 tablet (40 mg total) by mouth daily at 6 PM.   camphor-menthol lotion Commonly known as: SARNA Apply topically as needed for itching.   cephALEXin 500 MG capsule Commonly known as: KEFLEX Take 1 capsule (500 mg total) by mouth 4 (four) times daily for 5 days.   CLEAR EYES OP Place 1 drop into both eyes daily as needed (dry eyes).   hydrochlorothiazide 25 MG tablet Commonly known as: HYDRODIURIL Take 1 tablet (25 mg total) by mouth daily.   isosorbide mononitrate 60 MG 24 hr tablet Commonly  known as: IMDUR Take 1 tablet (60 mg total) by mouth at bedtime.   meloxicam 15 MG tablet Commonly known as: MOBIC TAKE 1 TABLET BY MOUTH EVERY DAY   methadone 5 MG tablet Commonly known as: DOLOPHINE Take 3 tablets (15 mg total) by mouth every 8 (eight) hours.   methocarbamol 500 MG tablet Commonly known as: ROBAXIN Take 1 tablet (500 mg total) by mouth 3 (three) times daily.   metoprolol succinate 50 MG 24 hr tablet Commonly known as: TOPROL-XL Take 2 tablets (100 mg total) by mouth daily. Take with or immediately following a meal.   multivitamin with minerals Tabs tablet Take 1 tablet by mouth at bedtime.   nitroGLYCERIN 0.4 MG SL tablet Commonly known as: NITROSTAT Place 1 tablet (0.4 mg total) under the tongue every 5 (five) minutes as needed for chest pain.   potassium chloride SA 20 MEQ tablet Commonly known as: KLOR-CON Take 1 tablet (20 mEq total) by mouth 3 (three) times daily.   Raised Toilet Seat Misc 1 each by Does not apply route as needed.   tamoxifen 20 MG tablet Commonly known as: NOLVADEX TAKE 1 TABLET BY MOUTH EVERY DAY   Vitamin D (Ergocalciferol) 1.25 MG (50000 UT) Caps capsule Commonly known as: DRISDOL TAKE 1 CAPSULE (50,000 UNITS TOTAL) BY MOUTH EVERY 7 (SEVEN) DAYS.        Brief H and P: For complete details please refer to admission H and P, but in brief *  Patient is a 58 year old female with bilateral breast CA, chronic left arm lymphedema, CAD status post PCI, chronic anemia, chronic pain on methadone, hypertension, hyperlipidemia, chronic leg edema presented with left arm pain and swelling.  Patient reported she was at her baseline until a day before the admission.  Initially started having itching on the left arm, progressed to swelling, pain and then noticed red streaks on her arm.  In ED she also reported some chest pain, sharp and pleuritic.  Also has chronic leg swelling, unchanged from baseline. Patient had a temp of 100.9 F, reported  severe pain in left arm, and leukocytosis    Hospital Course:   Left arm cellulitis, - Patient has chronic lymphedema and venous stasis, prone to injury and subsequent infection - Patient was placed on IV ceftriaxone.  ESR normal however elevated CRP -Leukocytosis and CRP improving.  Pain in the left arm improving.  Patient was placed on IV ceftriaxone, transition to oral Keflex at the time of discharge.  - venous doppler LUE negative for DVT   Atypical chest pain with prior history of CAD Somewhat pleuritic in nature based on description, EKG unremarkable, serial troponins negative -Continue aspirin, Imdur, Lipitor, Toprol-XL.  Norvasc was decreased and HCTZ added due to chronic LE edema -2D echo showed preserved EF 60 to 65%  Chronic pain syndrome Continue methadone   Bilateral breast CA Continue tamoxifen, outpatient follow-up with Dr. [name]  Morbid obesity BMI 40.9, counseled on diet and weight control    Day of Discharge S: No acute complaints this morning, no fevers or chills.  Left arm feels better, wants to go home.  BP 125/75   Pulse 64   Temp (!) 97.4 F (36.3 C) (Oral)   Resp 18   Ht _0  (1.651 m)   Wt 110.4 kg   LMP 08/07/2011   SpO2 95%   BMI 40.50 kg/m   Physical Exam: General: Alert and awake oriented x3 not in any acute distress. HEENT: anicteric sclera, pupils reactive to light and accommodation CVS: S1-S2 clear no murmur rubs or gallops Chest: clear to auscultation bilaterally, no wheezing rales or rhonchi Abdomen: soft nontender, nondistended, normal bowel sounds Extremities: no cyanosis, clubbing or edema noted bilaterally Neuro: Cranial nerves II-XII intact, no focal neurological deficits   The results of significant diagnostics from this hospitalization (including imaging, microbiology, ancillary and laboratory) are listed below for reference.      Procedures/Studies:  Dg Chest 2 View  Result Date: 09/27/2019 CLINICAL DATA:   Mid chest pain, fever EXAM: CHEST - 2 VIEW COMPARISON:  07/13/2019 FINDINGS: Subsegmental atelectasis the bases. No pleural effusion. Normal heart size. No pneumothorax. Clips in the left axilla. IMPRESSION: No active cardiopulmonary disease. Subsegmental atelectasis at the bases Electronically Signed   By: Donavan Foil M.D.   On: 09/27/2019 22:30   Dg Forearm Left  Result Date: 09/28/2019 CLINICAL DATA:  Left arm pain and cellulitis. No known injury. EXAM: LEFT FOREARM - 2 VIEW COMPARISON:  Radiographs 07/13/2019. FINDINGS: The mineralization and alignment are normal. There is no evidence of acute fracture or dislocation. Stable mild degenerative changes at the 1st carpometacarpal articulation. Diffuse soft tissue edema is again noted, similar to the previous study. No evidence of radiopaque foreign body or soft tissue emphysema. IMPRESSION: No acute osseous findings or evidence of osteomyelitis. Stable diffuse soft tissue edema, nonspecific. Electronically Signed   By: Richardean Sale M.D.   On: 09/28/2019 10:26   Ue Venous Duplex ([facility] Only)  Result Date: 09/30/2019 UPPER VENOUS STUDY  Indications: Swelling, Edema, and Pain Comparison Study: previous study done 07/14/19 Performing Technologist: Abram Sander RVS  Examination Guidelines: A complete evaluation includes B-mode imaging, spectral Doppler, color Doppler, and power Doppler as needed of all accessible portions of each vessel. Bilateral testing is considered an integral part of a complete examination. Limited examinations for reoccurring indications may be performed as noted.  Right Findings: +----------+------------+---------+-----------+----------+--------------+ RIGHT     CompressiblePhasicitySpontaneousProperties   Summary     +----------+------------+---------+-----------+----------+--------------+ Subclavian                                          Not visualized  +----------+------------+---------+-----------+----------+--------------+  Left Findings: +----------+------------+---------+-----------+----------+-------+ LEFT      CompressiblePhasicitySpontaneousPropertiesSummary +----------+------------+---------+-----------+----------+-------+ IJV           Full       Yes       Yes                      +----------+------------+---------+-----------+----------+-------+ Subclavian    Full       Yes       Yes                      +----------+------------+---------+-----------+----------+-------+ Axillary      Full       Yes       Yes                      +----------+------------+---------+-----------+----------+-------+ Brachial      Full       Yes       Yes                      +----------+------------+---------+-----------+----------+-------+ Radial        Full                                          +----------+------------+---------+-----------+----------+-------+ Ulnar         Full                                          +----------+------------+---------+-----------+----------+-------+ Cephalic      Full                                          +----------+------------+---------+-----------+----------+-------+ Basilic       Full                                          +----------+------------+---------+-----------+----------+-------+  Summary:  Left: No evidence of deep vein thrombosis in the upper extremity. No evidence of superficial vein thrombosis in the upper extremity.  *See table(s) above for measurements and observations.  Diagnosing physician: Ruta Hinds MD Electronically signed by Ruta Hinds MD on 09/30/2019 at 12:35:52 PM.    Final        LAB RESULTS: Basic Metabolic Panel: Recent Labs  Lab 09/29/19 0538 09/30/19 0611  NA 138 139  K 3.4* 3.4*  CL 105 106  CO2 22 23  GLUCOSE 100* 100*  BUN 12 12  CREATININE 0.87 0.78  CALCIUM 8.5* 8.4*  MG 2.0  --    Liver Function  Tests: Recent Labs  Lab 09/29/19 0538  AST 17  ALT 15  ALKPHOS 82  BILITOT 0.3  PROT 6.0*  ALBUMIN 3.0*   No results for input(s): LIPASE, AMYLASE in the last 168 hours. No results for input(s): AMMONIA in the last 168 hours. CBC: Recent Labs  Lab 09/30/19 0528 10/01/19 0508  WBC 10.1 8.6  NEUTROABS 6.9 5.5  HGB 12.4 13.1  HCT 38.8 41.0  MCV 89.2 88.0  PLT 397 436*   Cardiac Enzymes: Recent Labs  Lab 09/28/19 0842  CKTOTAL 79   BNP: Invalid input(s): POCBNP CBG: No results for input(s): GLUCAP in the last 168 hours.    Disposition and Follow-up: Discharge Instructions    Diet - low sodium heart healthy   Complete by: As directed    Increase activity slowly   Complete by: As directed        DISPOSITION: [place]    [name], MD. Schedule an appointment as soon as possible for a visit in 2 week(s).   Specialty: Family Medicine Contact information: [address] [place] [address] [phone]        Belva Crome, MD .   Specialty: Cardiology Contact information: [phone] [address] [place] [address] [phone]            Time coordinating discharge:  35 minutes  Signed:   Estill Cotta M.D. [facility] 10/01/2019, 1:00 PM

## 2019-10-01 NOTE — Progress Notes (Signed)
Pt to be discharged to home this afternoon. Pt given discharge teaching and Medications and schedules reviewed with Pt. Pt verbalized understanding of all discharge teaching/instructions. Pt discharged to home via wheelchair discharge packet with Pt at time of discharge.
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
## 2019-10-02 DIAGNOSIS — I220 Subsequent ST elevation (STEMI) myocardial infarction of anterior wall: Secondary | ICD-10-CM | POA: Diagnosis not present

## 2019-10-03 ENCOUNTER — Ambulatory Visit: Payer: Medicaid Other | Admitting: Podiatry

## 2019-10-03 DIAGNOSIS — I220 Subsequent ST elevation (STEMI) myocardial infarction of anterior wall: Secondary | ICD-10-CM | POA: Diagnosis not present

## 2019-10-04 ENCOUNTER — Other Ambulatory Visit: Payer: Self-pay | Admitting: Oncology

## 2019-10-04 ENCOUNTER — Telehealth: Payer: Medicaid Other | Admitting: Family Medicine

## 2019-10-04 DIAGNOSIS — M792 Neuralgia and neuritis, unspecified: Secondary | ICD-10-CM

## 2019-10-04 DIAGNOSIS — I10 Essential (primary) hypertension: Secondary | ICD-10-CM

## 2019-10-04 DIAGNOSIS — Z853 Personal history of malignant neoplasm of breast: Secondary | ICD-10-CM

## 2019-10-04 DIAGNOSIS — I220 Subsequent ST elevation (STEMI) myocardial infarction of anterior wall: Secondary | ICD-10-CM | POA: Diagnosis not present

## 2019-10-04 DIAGNOSIS — G8929 Other chronic pain: Secondary | ICD-10-CM

## 2019-10-04 LAB — CULTURE, BLOOD (ROUTINE X 2)
Culture: NO GROWTH
Culture: NO GROWTH
Special Requests: ADEQUATE
Special Requests: ADEQUATE

## 2019-10-04 MED ORDER — METHADONE HCL 5 MG PO TABS: 15.0000 mg | ORAL_TABLET | Freq: Three times a day (TID) | ORAL | 0 refills | Status: DC

## 2019-10-05 ENCOUNTER — Telehealth: Payer: Medicaid Other

## 2019-10-05 ENCOUNTER — Other Ambulatory Visit: Payer: Self-pay

## 2019-10-05 DIAGNOSIS — I220 Subsequent ST elevation (STEMI) myocardial infarction of anterior wall: Secondary | ICD-10-CM | POA: Diagnosis not present

## 2019-10-05 MED FILL — METHADONE HCL 5 MG TABLET: 5 | 30 days supply | Qty: 270 | Fill #0

## 2019-10-06 DIAGNOSIS — I220 Subsequent ST elevation (STEMI) myocardial infarction of anterior wall: Secondary | ICD-10-CM | POA: Diagnosis not present

## 2019-10-07 DIAGNOSIS — I220 Subsequent ST elevation (STEMI) myocardial infarction of anterior wall: Secondary | ICD-10-CM | POA: Diagnosis not present

## 2019-10-08 ENCOUNTER — Other Ambulatory Visit: Payer: Self-pay

## 2019-10-08 ENCOUNTER — Telehealth (INDEPENDENT_AMBULATORY_CARE_PROVIDER_SITE_OTHER): Payer: Self-pay | Admitting: Family Medicine

## 2019-10-08 DIAGNOSIS — I220 Subsequent ST elevation (STEMI) myocardial infarction of anterior wall: Secondary | ICD-10-CM | POA: Diagnosis not present

## 2019-10-08 NOTE — Progress Notes (Signed)
[facility] Telemedicine Visit  Patient consented to have virtual visit. Method of visit: Telephone  Encounter participants: Patient: [name] - located at home Provider: Wilber Oliphant - located at [facility] Others (if applicable): --  Chief Complaint: Follow-up for left upper extremity cellulitis  HPI: Patient was hospitalized at [facility] from 10/9-10/12 for IV antibiotics in the setting of left upper extremity cellulitis.  She was discharged home with 5 days of Keflex 500 mg 4 times daily.  Patient calls today reporting that she still has some pain in the area and it does not feel back to normal.  She is asking for more antibiotics at this time to see if that makes it feel better.  She denies any fevers or chills at this time.  She denies any worsening of the cellulitis.  ROS: per HPI  Pertinent PMHx: Breast cancer, bilateral, currently on tamoxifen  Exam:  Respiratory: Within normal limits  Assessment/Plan: Patient calling for antibiotics to be sent to the pharmacy.  I am concerned that patient is not improving with 3 days of IV antibiotics and 5 days of p.o. antibiotics.  Given her other medical history, I would want her to be seen in the office to visualize the cellulitis.  There are no pictures in the chart or specific descriptions in the physical exams from hospitalization.  We do not have any appointments available 10/20 that patient is available for, however, we can schedule her for 10/21 at 8:30 AM.  In the meantime, will send Keflex 4 times daily to the pharmacy to last until her appointment.  Patient provided with return precautions.  No charge for phone visit today  Wilber Oliphant, M.D.

## 2019-10-09 DIAGNOSIS — I220 Subsequent ST elevation (STEMI) myocardial infarction of anterior wall: Secondary | ICD-10-CM | POA: Diagnosis not present

## 2019-10-10 ENCOUNTER — Telehealth: Payer: Self-pay | Admitting: Family Medicine

## 2019-10-10 ENCOUNTER — Other Ambulatory Visit: Payer: Self-pay

## 2019-10-10 ENCOUNTER — Ambulatory Visit: Payer: Medicaid Other

## 2019-10-10 DIAGNOSIS — I220 Subsequent ST elevation (STEMI) myocardial infarction of anterior wall: Secondary | ICD-10-CM | POA: Diagnosis not present

## 2019-10-10 MED ORDER — CEPHALEXIN 500 MG PO CAPS: 500.0000 mg | ORAL_CAPSULE | Freq: Four times a day (QID) | ORAL | 0 refills | Status: DC

## 2019-10-10 NOTE — Telephone Encounter (Signed)
Sent medication to pharmacy. Patient missed her appointment this morning with Dr. Shan Levans. She will need to be seen to request any further medication.   Wilber Oliphant, M.D.  1:45 PM 10/10/2019

## 2019-10-10 NOTE — Telephone Encounter (Signed)
Patient called to see If she could get a med refill on her medication that was supposed to be called in for her on 10-19 during her virtual visit, the medication is cephalexine. Please give patient a call.

## 2019-10-10 NOTE — Telephone Encounter (Signed)
Routing to Dr. Maudie Mercury as she was the Dr. Parks Ranger had the televisit with pt. Ottis Stain, CMA

## 2019-10-11 DIAGNOSIS — I220 Subsequent ST elevation (STEMI) myocardial infarction of anterior wall: Secondary | ICD-10-CM | POA: Diagnosis not present

## 2019-10-12 DIAGNOSIS — I972 Postmastectomy lymphedema syndrome: Secondary | ICD-10-CM | POA: Diagnosis not present

## 2019-10-12 DIAGNOSIS — I220 Subsequent ST elevation (STEMI) myocardial infarction of anterior wall: Secondary | ICD-10-CM | POA: Diagnosis not present

## 2019-10-13 DIAGNOSIS — I220 Subsequent ST elevation (STEMI) myocardial infarction of anterior wall: Secondary | ICD-10-CM | POA: Diagnosis not present

## 2019-10-14 DIAGNOSIS — I220 Subsequent ST elevation (STEMI) myocardial infarction of anterior wall: Secondary | ICD-10-CM | POA: Diagnosis not present

## 2019-10-15 ENCOUNTER — Ambulatory Visit: Payer: Medicaid Other | Admitting: Podiatry

## 2019-10-15 DIAGNOSIS — I220 Subsequent ST elevation (STEMI) myocardial infarction of anterior wall: Secondary | ICD-10-CM | POA: Diagnosis not present

## 2019-10-16 DIAGNOSIS — I220 Subsequent ST elevation (STEMI) myocardial infarction of anterior wall: Secondary | ICD-10-CM | POA: Diagnosis not present

## 2019-10-17 DIAGNOSIS — I220 Subsequent ST elevation (STEMI) myocardial infarction of anterior wall: Secondary | ICD-10-CM | POA: Diagnosis not present

## 2019-10-18 DIAGNOSIS — I220 Subsequent ST elevation (STEMI) myocardial infarction of anterior wall: Secondary | ICD-10-CM | POA: Diagnosis not present

## 2019-10-19 DIAGNOSIS — I220 Subsequent ST elevation (STEMI) myocardial infarction of anterior wall: Secondary | ICD-10-CM | POA: Diagnosis not present

## 2019-10-20 DIAGNOSIS — I220 Subsequent ST elevation (STEMI) myocardial infarction of anterior wall: Secondary | ICD-10-CM | POA: Diagnosis not present

## 2019-10-21 DIAGNOSIS — I220 Subsequent ST elevation (STEMI) myocardial infarction of anterior wall: Secondary | ICD-10-CM | POA: Diagnosis not present

## 2019-10-22 ENCOUNTER — Other Ambulatory Visit: Payer: Self-pay

## 2019-10-22 ENCOUNTER — Ambulatory Visit: Payer: Medicaid Other | Admitting: Podiatry

## 2019-10-22 ENCOUNTER — Ambulatory Visit (INDEPENDENT_AMBULATORY_CARE_PROVIDER_SITE_OTHER): Payer: Medicaid Other

## 2019-10-22 DIAGNOSIS — M2012 Hallux valgus (acquired), left foot: Secondary | ICD-10-CM

## 2019-10-22 DIAGNOSIS — I220 Subsequent ST elevation (STEMI) myocardial infarction of anterior wall: Secondary | ICD-10-CM | POA: Diagnosis not present

## 2019-10-22 DIAGNOSIS — M2042 Other hammer toe(s) (acquired), left foot: Secondary | ICD-10-CM

## 2019-10-22 NOTE — Patient Instructions (Signed)
Pre-Operative Instructions  Congratulations, you have decided to take an important step towards improving your quality of life.  You can be assured that the doctors and staff at [facility] will be with you every step of the way.  Here are some important things you should know:  1. Plan to be at the surgery center/hospital at least 1 (one) hour prior to your scheduled time, unless otherwise directed by the surgical center/hospital staff.  You must have a responsible adult accompany you, remain during the surgery and drive you home.  Make sure you have directions to the surgical center/hospital to ensure you arrive on time. 2. If you are having surgery at [facility] or [facility] hospitals, you will need a copy of your medical history and physical form from your family physician within one month prior to the date of surgery. We will give you a form for your primary physician to complete.  3. We make every effort to accommodate the date you request for surgery.  However, there are times where surgery dates or times have to be moved.  We will contact you as soon as possible if a change in schedule is required.   4. No aspirin/ibuprofen for one week before surgery.  If you are on aspirin, any non-steroidal anti-inflammatory medications (Mobic, Aleve, Ibuprofen) should not be taken seven (7) days prior to your surgery.  You make take Tylenol for pain prior to surgery.  5. Medications - If you are taking daily heart and blood pressure medications, seizure, reflux, allergy, asthma, anxiety, pain or diabetes medications, make sure you notify the surgery center/hospital before the day of surgery so they can tell you which medications you should take or avoid the day of surgery. 6. No food or drink after midnight the night before surgery unless directed otherwise by surgical center/hospital staff. 7. No alcoholic beverages 24-hours prior to surgery.  No smoking 24-hours prior or 24-hours after  surgery. 8. Wear loose pants or shorts. They should be loose enough to fit over bandages, boots, and casts. 9. Don't wear slip-on shoes. Sneakers are preferred. 10. Bring your boot with you to the surgery center/hospital.  Also bring crutches or a walker if your physician has prescribed it for you.  If you do not have this equipment, it will be provided for you after surgery. 11. If you have not been contacted by the surgery center/hospital by the day before your surgery, call to confirm the date and time of your surgery. 12. Leave-time from work may vary depending on the type of surgery you have.  Appropriate arrangements should be made prior to surgery with your employer. 13. Prescriptions will be provided immediately following surgery by your doctor.  Fill these as soon as possible after surgery and take the medication as directed. Pain medications will not be refilled on weekends and must be approved by the doctor. 14. Remove nail polish on the operative foot and avoid getting pedicures prior to surgery. 15. Wash the night before surgery.  The night before surgery wash the foot and leg well with water and the antibacterial soap provided. Be sure to pay special attention to beneath the toenails and in between the toes.  Wash for at least three (3) minutes. Rinse thoroughly with water and dry well with a towel.  Perform this wash unless told not to do so by your physician.  Enclosed: 1 Ice pack (please put in freezer the night before surgery)   1 Hibiclens skin cleaner     Pre-op instructions  If you have any questions regarding the instructions, please do not hesitate to call our office.  [place]: [address], [place], [place] [address] -- [phone]  [place]: [address] [place], [place] [address] -- [phone]  [place]: [address]  [place], [place] [address] -- [phone]   Website: [email or web address] 

## 2019-10-23 DIAGNOSIS — I220 Subsequent ST elevation (STEMI) myocardial infarction of anterior wall: Secondary | ICD-10-CM | POA: Diagnosis not present

## 2019-10-24 DIAGNOSIS — I220 Subsequent ST elevation (STEMI) myocardial infarction of anterior wall: Secondary | ICD-10-CM | POA: Diagnosis not present
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
## 2019-10-25 DIAGNOSIS — I220 Subsequent ST elevation (STEMI) myocardial infarction of anterior wall: Secondary | ICD-10-CM | POA: Diagnosis not present

## 2019-10-26 DIAGNOSIS — I220 Subsequent ST elevation (STEMI) myocardial infarction of anterior wall: Secondary | ICD-10-CM | POA: Diagnosis not present

## 2019-10-27 DIAGNOSIS — I220 Subsequent ST elevation (STEMI) myocardial infarction of anterior wall: Secondary | ICD-10-CM | POA: Diagnosis not present

## 2019-10-28 DIAGNOSIS — I220 Subsequent ST elevation (STEMI) myocardial infarction of anterior wall: Secondary | ICD-10-CM | POA: Diagnosis not present

## 2019-10-29 DIAGNOSIS — I220 Subsequent ST elevation (STEMI) myocardial infarction of anterior wall: Secondary | ICD-10-CM | POA: Diagnosis not present
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
## 2019-10-30 ENCOUNTER — Encounter: Payer: Self-pay | Admitting: *Deleted

## 2019-10-30 ENCOUNTER — Ambulatory Visit (INDEPENDENT_AMBULATORY_CARE_PROVIDER_SITE_OTHER): Payer: Medicaid Other | Admitting: Physician Assistant

## 2019-10-30 ENCOUNTER — Other Ambulatory Visit: Payer: Self-pay

## 2019-10-30 ENCOUNTER — Encounter: Payer: Self-pay | Admitting: Physician Assistant

## 2019-10-30 VITALS — BP 116/68 | HR 76 | Ht 65.0 in | Wt 242.0 lb

## 2019-10-30 DIAGNOSIS — F1410 Cocaine abuse, uncomplicated: Secondary | ICD-10-CM

## 2019-10-30 DIAGNOSIS — I10 Essential (primary) hypertension: Secondary | ICD-10-CM | POA: Diagnosis not present

## 2019-10-30 DIAGNOSIS — I2510 Atherosclerotic heart disease of native coronary artery without angina pectoris: Secondary | ICD-10-CM | POA: Diagnosis not present

## 2019-10-30 DIAGNOSIS — Z720 Tobacco use: Secondary | ICD-10-CM

## 2019-10-30 DIAGNOSIS — I220 Subsequent ST elevation (STEMI) myocardial infarction of anterior wall: Secondary | ICD-10-CM | POA: Diagnosis not present

## 2019-10-30 DIAGNOSIS — E785 Hyperlipidemia, unspecified: Secondary | ICD-10-CM | POA: Diagnosis not present

## 2019-10-30 NOTE — Progress Notes (Signed)
[place] Clinical Social Work  Patient returned completed patient portion of the [facility].  CSW submitted completed application.  Johnnye Lana, MSW, LCSW, OSW-C Clinical Social Worker [facility] [phone]

## 2019-10-30 NOTE — Patient Instructions (Addendum)
Medication Instructions:  Your physician recommends that you continue on your current medications as directed. Please refer to the Current Medication list given to you today.  *If you need a refill on your cardiac medications before your next appointment, please call your pharmacy*  Lab Work:   [place] 2 TO 3 WEEKS   If you have labs (blood work) drawn today and your tests are completely normal, you will receive your results only by: Marland Kitchen MyChart Message (if you have MyChart) OR . A paper copy in the mail If you have any lab test that is abnormal or we need to change your treatment, we will call you to review the results.  Testing/Procedures:  IN 2 TO 3 MONTHS Your physician has requested that you have an exercise tolerance test. For further information please visit [email or web address]. Please also follow instruction sheet, as given.   Follow-Up: At [facility], you and your health needs are our priority.  As part of our continuing mission to provide you with exceptional heart care, we have created designated Provider Care Teams.  These Care Teams include your primary Cardiologist (physician) and Advanced Practice Providers (APPs -  Physician Assistants and Nurse Practitioners) who all work together to provide you with the care you need, when you need it.  Your next appointment:   12 months  The format for your next appointment:   In Person  Provider:   You may see Sinclair Grooms, MD or one of the following Advanced Practice Providers on your designated Care Team:    Truitt Merle, NP  Cecilie Kicks, NP  Kathyrn Drown, NP   Other Instructions

## 2019-10-31 ENCOUNTER — Telehealth: Payer: Self-pay | Admitting: Oncology

## 2019-10-31 DIAGNOSIS — I220 Subsequent ST elevation (STEMI) myocardial infarction of anterior wall: Secondary | ICD-10-CM | POA: Diagnosis not present

## 2019-10-31 NOTE — Telephone Encounter (Signed)
Scheduled appt per 11/11 sch message - pt aware of apt date and time

## 2019-11-01 ENCOUNTER — Other Ambulatory Visit: Payer: Self-pay | Admitting: Oncology

## 2019-11-01 DIAGNOSIS — Z853 Personal history of malignant neoplasm of breast: Secondary | ICD-10-CM

## 2019-11-01 DIAGNOSIS — I220 Subsequent ST elevation (STEMI) myocardial infarction of anterior wall: Secondary | ICD-10-CM | POA: Diagnosis not present

## 2019-11-01 DIAGNOSIS — I10 Essential (primary) hypertension: Secondary | ICD-10-CM

## 2019-11-01 DIAGNOSIS — G8929 Other chronic pain: Secondary | ICD-10-CM

## 2019-11-01 DIAGNOSIS — M792 Neuralgia and neuritis, unspecified: Secondary | ICD-10-CM

## 2019-11-01 MED ORDER — METHADONE HCL 5 MG PO TABS: 15.0000 mg | ORAL_TABLET | Freq: Three times a day (TID) | ORAL | 0 refills | Status: DC

## 2019-11-01 MED FILL — METHADONE HCL 5 MG TABLET: 5 | 30 days supply | Qty: 270 | Fill #0

## 2019-11-02 DIAGNOSIS — I220 Subsequent ST elevation (STEMI) myocardial infarction of anterior wall: Secondary | ICD-10-CM | POA: Diagnosis not present

## 2019-11-03 DIAGNOSIS — I220 Subsequent ST elevation (STEMI) myocardial infarction of anterior wall: Secondary | ICD-10-CM | POA: Diagnosis not present

## 2019-11-04 DIAGNOSIS — I220 Subsequent ST elevation (STEMI) myocardial infarction of anterior wall: Secondary | ICD-10-CM | POA: Diagnosis not present

## 2019-11-05 DIAGNOSIS — I220 Subsequent ST elevation (STEMI) myocardial infarction of anterior wall: Secondary | ICD-10-CM | POA: Diagnosis not present

## 2019-11-06 ENCOUNTER — Inpatient Hospital Stay: Payer: Medicaid Other | Attending: Adult Health | Admitting: Adult Health

## 2019-11-06 ENCOUNTER — Other Ambulatory Visit: Payer: Self-pay

## 2019-11-06 ENCOUNTER — Encounter: Payer: Self-pay | Admitting: Adult Health

## 2019-11-06 VITALS — BP 118/83 | HR 70 | Temp 98.9°F | Resp 18 | Ht 65.0 in | Wt 239.7 lb

## 2019-11-06 DIAGNOSIS — Z9013 Acquired absence of bilateral breasts and nipples: Secondary | ICD-10-CM | POA: Diagnosis not present

## 2019-11-06 DIAGNOSIS — C50912 Malignant neoplasm of unspecified site of left female breast: Secondary | ICD-10-CM | POA: Insufficient documentation

## 2019-11-06 DIAGNOSIS — G894 Chronic pain syndrome: Secondary | ICD-10-CM | POA: Diagnosis not present

## 2019-11-06 DIAGNOSIS — C50412 Malignant neoplasm of upper-outer quadrant of left female breast: Secondary | ICD-10-CM

## 2019-11-06 DIAGNOSIS — C50811 Malignant neoplasm of overlapping sites of right female breast: Secondary | ICD-10-CM

## 2019-11-06 DIAGNOSIS — F17210 Nicotine dependence, cigarettes, uncomplicated: Secondary | ICD-10-CM | POA: Insufficient documentation

## 2019-11-06 DIAGNOSIS — Z79891 Long term (current) use of opiate analgesic: Secondary | ICD-10-CM | POA: Diagnosis not present

## 2019-11-06 DIAGNOSIS — Z9221 Personal history of antineoplastic chemotherapy: Secondary | ICD-10-CM | POA: Insufficient documentation

## 2019-11-06 DIAGNOSIS — I220 Subsequent ST elevation (STEMI) myocardial infarction of anterior wall: Secondary | ICD-10-CM | POA: Diagnosis not present

## 2019-11-06 DIAGNOSIS — Z170 Estrogen receptor positive status [ER+]: Secondary | ICD-10-CM | POA: Diagnosis not present

## 2019-11-06 DIAGNOSIS — C50911 Malignant neoplasm of unspecified site of right female breast: Secondary | ICD-10-CM | POA: Insufficient documentation

## 2019-11-06 DIAGNOSIS — Z79810 Long term (current) use of selective estrogen receptor modulators (SERMs): Secondary | ICD-10-CM | POA: Insufficient documentation

## 2019-11-06 NOTE — Progress Notes (Signed)
Left chest wall [id number]
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
## 2019-11-07 ENCOUNTER — Ambulatory Visit: Payer: Medicaid Other | Admitting: Family Medicine

## 2019-11-07 DIAGNOSIS — I220 Subsequent ST elevation (STEMI) myocardial infarction of anterior wall: Secondary | ICD-10-CM | POA: Diagnosis not present

## 2019-11-08 ENCOUNTER — Other Ambulatory Visit: Payer: Self-pay

## 2019-11-08 ENCOUNTER — Emergency Department (HOSPITAL_COMMUNITY)
Admission: EM | Admit: 2019-11-08 | Discharge: 2019-11-08 | Discharge status: Against Medical Advice | Payer: Medicaid Other

## 2019-11-08 DIAGNOSIS — I220 Subsequent ST elevation (STEMI) myocardial infarction of anterior wall: Secondary | ICD-10-CM | POA: Diagnosis not present

## 2019-11-08 NOTE — ED Notes (Signed)
Pt moved into triage for vital signs, pt requested to use the BR. Pt ambulated to BR. Once pt came out of BR and back in triage 2, pt stated she felt better and did not need to be seen. Pt ambulated with steady gait out of ED without waiting to speak to triage RN [place].

## 2019-11-09 DIAGNOSIS — I220 Subsequent ST elevation (STEMI) myocardial infarction of anterior wall: Secondary | ICD-10-CM | POA: Diagnosis not present

## 2019-11-10 DIAGNOSIS — I220 Subsequent ST elevation (STEMI) myocardial infarction of anterior wall: Secondary | ICD-10-CM | POA: Diagnosis not present

## 2019-11-11 DIAGNOSIS — I220 Subsequent ST elevation (STEMI) myocardial infarction of anterior wall: Secondary | ICD-10-CM | POA: Diagnosis not present

## 2019-11-12 DIAGNOSIS — I220 Subsequent ST elevation (STEMI) myocardial infarction of anterior wall: Secondary | ICD-10-CM | POA: Diagnosis not present

## 2019-11-13 ENCOUNTER — Encounter: Payer: Self-pay | Admitting: *Deleted

## 2019-11-13 ENCOUNTER — Telehealth: Payer: Self-pay | Admitting: *Deleted

## 2019-11-13 ENCOUNTER — Other Ambulatory Visit: Payer: Self-pay

## 2019-11-13 ENCOUNTER — Other Ambulatory Visit: Payer: Medicaid Other

## 2019-11-13 ENCOUNTER — Other Ambulatory Visit: Payer: Self-pay | Admitting: Physician Assistant

## 2019-11-13 DIAGNOSIS — I2510 Atherosclerotic heart disease of native coronary artery without angina pectoris: Secondary | ICD-10-CM | POA: Diagnosis not present

## 2019-11-13 DIAGNOSIS — I220 Subsequent ST elevation (STEMI) myocardial infarction of anterior wall: Secondary | ICD-10-CM | POA: Diagnosis not present

## 2019-11-13 LAB — LIPID PANEL
Chol/HDL Ratio: 2.6 ratio (ref 0.0–4.4)
Cholesterol, Total: 117 mg/dL (ref 100–199)
HDL: 45 mg/dL (ref >39)
LDL Chol Calc (NIH): 51 mg/dL (ref 0–99)
Triglycerides: 114 mg/dL (ref 0–149)
VLDL Cholesterol Cal: 21 mg/dL (ref 5–40)

## 2019-11-13 NOTE — Telephone Encounter (Addendum)
Left message to call back.  Order pended until we can speak to pt. Pt also has a lab in triage that needs to be reviewed with her.

## 2019-11-13 NOTE — Telephone Encounter (Signed)
-----   Message from Liliane Shi, [place] sent at 11/13/2019  4:39 PM EST ----- Patient was set up for an ETT at last visit. But, she needs surgery soon. Please cancel her ETT and order a Lexiscan Myoview so that we can get it done sooner. Dx: Coronary artery disease, asymptomatic, PCI > 2 years. Richardson Dopp, PA-C    11/13/2019 4:43 PM

## 2019-11-13 NOTE — Progress Notes (Signed)
Per Dr. Amalia Hailey, I sent a surgical medical clearance request letter to Dr. Tamala Julian and to Richardson Dopp, PA-C.  [name] is scheduled for surgery on 12/27/2019.

## 2019-11-14 DIAGNOSIS — I220 Subsequent ST elevation (STEMI) myocardial infarction of anterior wall: Secondary | ICD-10-CM | POA: Diagnosis not present

## 2019-11-15 DIAGNOSIS — I220 Subsequent ST elevation (STEMI) myocardial infarction of anterior wall: Secondary | ICD-10-CM | POA: Diagnosis not present

## 2019-11-16 DIAGNOSIS — I220 Subsequent ST elevation (STEMI) myocardial infarction of anterior wall: Secondary | ICD-10-CM | POA: Diagnosis not present

## 2019-11-17 DIAGNOSIS — I220 Subsequent ST elevation (STEMI) myocardial infarction of anterior wall: Secondary | ICD-10-CM | POA: Diagnosis not present

## 2019-11-18 DIAGNOSIS — I220 Subsequent ST elevation (STEMI) myocardial infarction of anterior wall: Secondary | ICD-10-CM | POA: Diagnosis not present

## 2019-11-19 ENCOUNTER — Telehealth: Payer: Self-pay

## 2019-11-19 DIAGNOSIS — I220 Subsequent ST elevation (STEMI) myocardial infarction of anterior wall: Secondary | ICD-10-CM | POA: Diagnosis not present

## 2019-11-19 NOTE — Telephone Encounter (Signed)
Notes recorded by Frederik Schmidt, RN on 11/19/2019 at 11:13 AM EST  The patient has been notified of the result and verbalized understanding. All questions (if any) were answered.  Frederik Schmidt, RN 11/19/2019 11:13 AM

## 2019-11-19 NOTE — Telephone Encounter (Signed)
-----   Message from Liliane Shi, [place] sent at 11/13/2019  5:00 PM EST ----- Lipids are optimal. PLAN:   - Continue current medications and follow up as planned.  Richardson Dopp, PA-C    11/13/2019 5:00 PM

## 2019-11-20 DIAGNOSIS — I220 Subsequent ST elevation (STEMI) myocardial infarction of anterior wall: Secondary | ICD-10-CM | POA: Diagnosis not present

## 2019-11-20 NOTE — Telephone Encounter (Signed)
Please make sure patient is getting a Myoview scheduled. Richardson Dopp, PA-C    11/20/2019 5:06 PM

## 2019-11-21 DIAGNOSIS — I220 Subsequent ST elevation (STEMI) myocardial infarction of anterior wall: Secondary | ICD-10-CM | POA: Diagnosis not present

## 2019-11-21 NOTE — Telephone Encounter (Signed)
Yes. She needs the stress test for surgical clearance. Thanks, Richardson Dopp, PA-C    11/21/2019 8:52 PM

## 2019-11-21 NOTE — Telephone Encounter (Signed)
S/w pt is aware gxt was changed to lexi, scheduled today for Dec 16 and sent to pre-cert.  Went over all instructions for test. Pt is having foot surgery Jan 7 with  [facility].  Pt is having a bunionectomy and two broken toes fixed.  Will send to [place] to [place].

## 2019-11-22 DIAGNOSIS — I220 Subsequent ST elevation (STEMI) myocardial infarction of anterior wall: Secondary | ICD-10-CM | POA: Diagnosis not present

## 2019-11-23 DIAGNOSIS — I220 Subsequent ST elevation (STEMI) myocardial infarction of anterior wall: Secondary | ICD-10-CM | POA: Diagnosis not present

## 2019-11-24 DIAGNOSIS — I220 Subsequent ST elevation (STEMI) myocardial infarction of anterior wall: Secondary | ICD-10-CM | POA: Diagnosis not present

## 2019-11-25 DIAGNOSIS — I220 Subsequent ST elevation (STEMI) myocardial infarction of anterior wall: Secondary | ICD-10-CM | POA: Diagnosis not present

## 2019-11-26 DIAGNOSIS — I220 Subsequent ST elevation (STEMI) myocardial infarction of anterior wall: Secondary | ICD-10-CM | POA: Diagnosis not present

## 2019-11-27 DIAGNOSIS — I220 Subsequent ST elevation (STEMI) myocardial infarction of anterior wall: Secondary | ICD-10-CM | POA: Diagnosis not present

## 2019-11-28 ENCOUNTER — Other Ambulatory Visit: Payer: Self-pay | Admitting: Oncology

## 2019-11-28 ENCOUNTER — Other Ambulatory Visit: Payer: Self-pay | Admitting: *Deleted

## 2019-11-28 DIAGNOSIS — I220 Subsequent ST elevation (STEMI) myocardial infarction of anterior wall: Secondary | ICD-10-CM | POA: Diagnosis not present

## 2019-11-28 DIAGNOSIS — Z853 Personal history of malignant neoplasm of breast: Secondary | ICD-10-CM

## 2019-11-28 DIAGNOSIS — I10 Essential (primary) hypertension: Secondary | ICD-10-CM

## 2019-11-28 DIAGNOSIS — M792 Neuralgia and neuritis, unspecified: Secondary | ICD-10-CM

## 2019-11-28 DIAGNOSIS — G8929 Other chronic pain: Secondary | ICD-10-CM

## 2019-11-28 MED ORDER — MELOXICAM 15 MG PO TABS: 15.0000 mg | ORAL_TABLET | ORAL | 0 refills | Status: DC | PRN

## 2019-11-28 MED ORDER — METHADONE HCL 5 MG PO TABS: 15.0000 mg | ORAL_TABLET | Freq: Three times a day (TID) | ORAL | 0 refills | Status: DC

## 2019-11-29 ENCOUNTER — Ambulatory Visit: Payer: Medicaid Other | Admitting: Family Medicine

## 2019-11-29 ENCOUNTER — Telehealth (HOSPITAL_COMMUNITY): Payer: Self-pay | Admitting: *Deleted

## 2019-11-29 DIAGNOSIS — I220 Subsequent ST elevation (STEMI) myocardial infarction of anterior wall: Secondary | ICD-10-CM | POA: Diagnosis not present

## 2019-11-29 NOTE — Telephone Encounter (Signed)
Left message on voicemail in reference to upcoming appointment scheduled for 12/04/19. Phone number given for a call back so details instructions can be given. [name]

## 2019-11-30 ENCOUNTER — Other Ambulatory Visit: Payer: Self-pay | Admitting: Oncology

## 2019-11-30 DIAGNOSIS — M792 Neuralgia and neuritis, unspecified: Secondary | ICD-10-CM

## 2019-11-30 DIAGNOSIS — I220 Subsequent ST elevation (STEMI) myocardial infarction of anterior wall: Secondary | ICD-10-CM | POA: Diagnosis not present

## 2019-11-30 DIAGNOSIS — I10 Essential (primary) hypertension: Secondary | ICD-10-CM

## 2019-11-30 DIAGNOSIS — Z853 Personal history of malignant neoplasm of breast: Secondary | ICD-10-CM

## 2019-11-30 DIAGNOSIS — G8929 Other chronic pain: Secondary | ICD-10-CM

## 2019-11-30 MED FILL — METHADONE HCL 5 MG TABLET: 5 | 30 days supply | Qty: 270 | Fill #0

## 2019-12-01 DIAGNOSIS — I220 Subsequent ST elevation (STEMI) myocardial infarction of anterior wall: Secondary | ICD-10-CM | POA: Diagnosis not present

## 2019-12-02 DIAGNOSIS — I220 Subsequent ST elevation (STEMI) myocardial infarction of anterior wall: Secondary | ICD-10-CM | POA: Diagnosis not present

## 2019-12-03 ENCOUNTER — Telehealth (HOSPITAL_COMMUNITY): Payer: Self-pay | Admitting: *Deleted

## 2019-12-03 DIAGNOSIS — I220 Subsequent ST elevation (STEMI) myocardial infarction of anterior wall: Secondary | ICD-10-CM | POA: Diagnosis not present

## 2019-12-03 NOTE — Telephone Encounter (Signed)
Left message on voicemail in reference to upcoming appointment scheduled for 12/05/19. Phone number given for a call back so details instructions can be given.  Taryne Kiger Jacqueline   

## 2019-12-04 DIAGNOSIS — I220 Subsequent ST elevation (STEMI) myocardial infarction of anterior wall: Secondary | ICD-10-CM | POA: Diagnosis not present

## 2019-12-05 ENCOUNTER — Other Ambulatory Visit (HOSPITAL_COMMUNITY): Payer: Medicaid Other

## 2019-12-05 ENCOUNTER — Telehealth (HOSPITAL_COMMUNITY): Payer: Self-pay

## 2019-12-05 ENCOUNTER — Ambulatory Visit (HOSPITAL_COMMUNITY): Payer: Medicaid Other

## 2019-12-05 DIAGNOSIS — I220 Subsequent ST elevation (STEMI) myocardial infarction of anterior wall: Secondary | ICD-10-CM | POA: Diagnosis not present

## 2019-12-05 NOTE — Telephone Encounter (Signed)
Spoke with the patient, she couldn't remember her appointment time. Asked to call back with any questions. S.Roselinda Bahena EMTP

## 2019-12-06 DIAGNOSIS — I220 Subsequent ST elevation (STEMI) myocardial infarction of anterior wall: Secondary | ICD-10-CM | POA: Diagnosis not present

## 2019-12-07 DIAGNOSIS — I220 Subsequent ST elevation (STEMI) myocardial infarction of anterior wall: Secondary | ICD-10-CM | POA: Diagnosis not present

## 2019-12-08 DIAGNOSIS — I220 Subsequent ST elevation (STEMI) myocardial infarction of anterior wall: Secondary | ICD-10-CM | POA: Diagnosis not present

## 2019-12-09 DIAGNOSIS — I220 Subsequent ST elevation (STEMI) myocardial infarction of anterior wall: Secondary | ICD-10-CM | POA: Diagnosis not present

## 2019-12-10 DIAGNOSIS — I220 Subsequent ST elevation (STEMI) myocardial infarction of anterior wall: Secondary | ICD-10-CM | POA: Diagnosis not present

## 2019-12-11 ENCOUNTER — Ambulatory Visit (HOSPITAL_COMMUNITY): Payer: Medicaid Other | Attending: Cardiology

## 2019-12-11 ENCOUNTER — Other Ambulatory Visit: Payer: Self-pay

## 2019-12-11 DIAGNOSIS — I220 Subsequent ST elevation (STEMI) myocardial infarction of anterior wall: Secondary | ICD-10-CM | POA: Diagnosis not present

## 2019-12-11 DIAGNOSIS — I2510 Atherosclerotic heart disease of native coronary artery without angina pectoris: Secondary | ICD-10-CM | POA: Diagnosis not present

## 2019-12-11 MED ORDER — TECHNETIUM TC 99M TETROFOSMIN IV KIT
32.9000 | PACK | Freq: Once | INTRAVENOUS | Status: AC | PRN
  Administered 2019-12-11: 32.9 via INTRAVENOUS
  Filled 2019-12-11: qty 33

## 2019-12-11 MED ORDER — REGADENOSON 0.4 MG/5ML IV SOLN
0.4000 mg | Freq: Once | INTRAVENOUS | Status: AC
  Administered 2019-12-11: 0.4 mg via INTRAVENOUS

## 2019-12-12 ENCOUNTER — Ambulatory Visit (HOSPITAL_COMMUNITY): Payer: Medicaid Other

## 2019-12-12 DIAGNOSIS — I220 Subsequent ST elevation (STEMI) myocardial infarction of anterior wall: Secondary | ICD-10-CM | POA: Diagnosis not present

## 2019-12-13 DIAGNOSIS — I220 Subsequent ST elevation (STEMI) myocardial infarction of anterior wall: Secondary | ICD-10-CM | POA: Diagnosis not present

## 2019-12-14 DIAGNOSIS — I220 Subsequent ST elevation (STEMI) myocardial infarction of anterior wall: Secondary | ICD-10-CM | POA: Diagnosis not present

## 2019-12-15 DIAGNOSIS — I220 Subsequent ST elevation (STEMI) myocardial infarction of anterior wall: Secondary | ICD-10-CM | POA: Diagnosis not present

## 2019-12-16 DIAGNOSIS — I220 Subsequent ST elevation (STEMI) myocardial infarction of anterior wall: Secondary | ICD-10-CM | POA: Diagnosis not present

## 2019-12-17 DIAGNOSIS — I972 Postmastectomy lymphedema syndrome: Secondary | ICD-10-CM | POA: Diagnosis not present

## 2019-12-17 DIAGNOSIS — I220 Subsequent ST elevation (STEMI) myocardial infarction of anterior wall: Secondary | ICD-10-CM | POA: Diagnosis not present

## 2019-12-18 DIAGNOSIS — I220 Subsequent ST elevation (STEMI) myocardial infarction of anterior wall: Secondary | ICD-10-CM | POA: Diagnosis not present

## 2019-12-19 DIAGNOSIS — I220 Subsequent ST elevation (STEMI) myocardial infarction of anterior wall: Secondary | ICD-10-CM | POA: Diagnosis not present

## 2019-12-20 DIAGNOSIS — I220 Subsequent ST elevation (STEMI) myocardial infarction of anterior wall: Secondary | ICD-10-CM | POA: Diagnosis not present

## 2019-12-21 DIAGNOSIS — I220 Subsequent ST elevation (STEMI) myocardial infarction of anterior wall: Secondary | ICD-10-CM | POA: Diagnosis not present

## 2019-12-22 DIAGNOSIS — I220 Subsequent ST elevation (STEMI) myocardial infarction of anterior wall: Secondary | ICD-10-CM | POA: Diagnosis not present

## 2019-12-23 DIAGNOSIS — I220 Subsequent ST elevation (STEMI) myocardial infarction of anterior wall: Secondary | ICD-10-CM | POA: Diagnosis not present

## 2019-12-24 ENCOUNTER — Other Ambulatory Visit: Payer: Self-pay

## 2019-12-24 ENCOUNTER — Encounter (HOSPITAL_COMMUNITY): Payer: Medicaid Other | Attending: Cardiology

## 2019-12-24 DIAGNOSIS — I220 Subsequent ST elevation (STEMI) myocardial infarction of anterior wall: Secondary | ICD-10-CM | POA: Diagnosis not present

## 2019-12-24 LAB — MYOCARDIAL PERFUSION IMAGING
LV dias vol: 106 mL (ref 46–106)
LV sys vol: 56 mL
Peak HR: 90 {beats}/min
Rest HR: 72 {beats}/min
SDS: 3
SRS: 5
SSS: 6
TID: 1.06

## 2019-12-24 MED ORDER — TECHNETIUM TC 99M TETROFOSMIN IV KIT
32.6000 | PACK | Freq: Once | INTRAVENOUS | Status: AC | PRN
  Administered 2019-12-24: 32.6 via INTRAVENOUS
  Filled 2019-12-24: qty 33

## 2019-12-25 ENCOUNTER — Ambulatory Visit (HOSPITAL_COMMUNITY): Payer: Medicaid Other

## 2019-12-25 DIAGNOSIS — I220 Subsequent ST elevation (STEMI) myocardial infarction of anterior wall: Secondary | ICD-10-CM | POA: Diagnosis not present

## 2019-12-26 ENCOUNTER — Encounter: Payer: Self-pay | Admitting: Physician Assistant

## 2019-12-26 DIAGNOSIS — I220 Subsequent ST elevation (STEMI) myocardial infarction of anterior wall: Secondary | ICD-10-CM | POA: Diagnosis not present

## 2019-12-27 ENCOUNTER — Encounter: Payer: Self-pay | Admitting: Podiatry

## 2019-12-27 ENCOUNTER — Other Ambulatory Visit: Payer: Self-pay | Admitting: Podiatry

## 2019-12-27 DIAGNOSIS — M2012 Hallux valgus (acquired), left foot: Secondary | ICD-10-CM | POA: Diagnosis not present

## 2019-12-27 DIAGNOSIS — M2042 Other hammer toe(s) (acquired), left foot: Secondary | ICD-10-CM | POA: Diagnosis not present

## 2019-12-27 DIAGNOSIS — I220 Subsequent ST elevation (STEMI) myocardial infarction of anterior wall: Secondary | ICD-10-CM | POA: Diagnosis not present

## 2019-12-27 DIAGNOSIS — M25572 Pain in left ankle and joints of left foot: Secondary | ICD-10-CM | POA: Diagnosis not present

## 2019-12-27 DIAGNOSIS — I10 Essential (primary) hypertension: Secondary | ICD-10-CM | POA: Diagnosis not present

## 2019-12-27 DIAGNOSIS — M21542 Acquired clubfoot, left foot: Secondary | ICD-10-CM | POA: Diagnosis not present

## 2019-12-27 MED ORDER — OXYCODONE-ACETAMINOPHEN 5-325 MG PO TABS: 1.0000 | ORAL_TABLET | Freq: Four times a day (QID) | ORAL | 0 refills | Status: DC | PRN

## 2019-12-27 NOTE — Progress Notes (Signed)
PRN postop 

## 2019-12-28 ENCOUNTER — Telehealth: Payer: Self-pay | Admitting: *Deleted

## 2019-12-28 ENCOUNTER — Other Ambulatory Visit: Payer: Self-pay | Admitting: Adult Health

## 2019-12-28 ENCOUNTER — Other Ambulatory Visit: Payer: Self-pay | Admitting: Oncology

## 2019-12-28 DIAGNOSIS — I10 Essential (primary) hypertension: Secondary | ICD-10-CM

## 2019-12-28 DIAGNOSIS — Z853 Personal history of malignant neoplasm of breast: Secondary | ICD-10-CM

## 2019-12-28 DIAGNOSIS — G8929 Other chronic pain: Secondary | ICD-10-CM

## 2019-12-28 DIAGNOSIS — I220 Subsequent ST elevation (STEMI) myocardial infarction of anterior wall: Secondary | ICD-10-CM | POA: Diagnosis not present

## 2019-12-28 DIAGNOSIS — M792 Neuralgia and neuritis, unspecified: Secondary | ICD-10-CM

## 2019-12-28 MED ORDER — METHADONE HCL 5 MG PO TABS: ORAL_TABLET | ORAL | 0 refills | Status: DC

## 2019-12-28 NOTE — Progress Notes (Signed)
Refilling methadone.  Unable to login on PMP aware due to technical difficulties.  Chart review refills appropriate use.  Script sent with assistance of [place].    Wilber Bihari, NP

## 2019-12-28 NOTE — Telephone Encounter (Signed)
[facility] notified NOT to fill [facility]adone. Was supposed to be [facility] to [facility]. [facility]

## 2019-12-28 NOTE — Telephone Encounter (Signed)
Pt left message requesting a refill of methadone to be called into [facility] today.

## 2019-12-29 DIAGNOSIS — I220 Subsequent ST elevation (STEMI) myocardial infarction of anterior wall: Secondary | ICD-10-CM | POA: Diagnosis not present

## 2019-12-30 DIAGNOSIS — I220 Subsequent ST elevation (STEMI) myocardial infarction of anterior wall: Secondary | ICD-10-CM | POA: Diagnosis not present

## 2019-12-31 ENCOUNTER — Ambulatory Visit (INDEPENDENT_AMBULATORY_CARE_PROVIDER_SITE_OTHER): Payer: Medicaid Other

## 2019-12-31 ENCOUNTER — Ambulatory Visit (INDEPENDENT_AMBULATORY_CARE_PROVIDER_SITE_OTHER): Payer: Self-pay | Admitting: Podiatry

## 2019-12-31 ENCOUNTER — Other Ambulatory Visit: Payer: Self-pay

## 2019-12-31 ENCOUNTER — Other Ambulatory Visit: Payer: Self-pay | Admitting: Oncology

## 2019-12-31 DIAGNOSIS — M79672 Pain in left foot: Secondary | ICD-10-CM

## 2019-12-31 DIAGNOSIS — I10 Essential (primary) hypertension: Secondary | ICD-10-CM

## 2019-12-31 DIAGNOSIS — G8929 Other chronic pain: Secondary | ICD-10-CM

## 2019-12-31 DIAGNOSIS — M792 Neuralgia and neuritis, unspecified: Secondary | ICD-10-CM

## 2019-12-31 DIAGNOSIS — Z853 Personal history of malignant neoplasm of breast: Secondary | ICD-10-CM

## 2019-12-31 DIAGNOSIS — Z9889 Other specified postprocedural states: Secondary | ICD-10-CM

## 2019-12-31 DIAGNOSIS — M2042 Other hammer toe(s) (acquired), left foot: Secondary | ICD-10-CM

## 2019-12-31 DIAGNOSIS — I220 Subsequent ST elevation (STEMI) myocardial infarction of anterior wall: Secondary | ICD-10-CM | POA: Diagnosis not present

## 2019-12-31 MED ORDER — OXYCODONE-ACETAMINOPHEN 5-325 MG PO TABS: 1.0000 | ORAL_TABLET | Freq: Four times a day (QID) | ORAL | 0 refills | Status: DC | PRN

## 2019-12-31 MED ORDER — METHADONE HCL 5 MG PO TABS: ORAL_TABLET | ORAL | 0 refills | Status: DC

## 2020-01-01 DIAGNOSIS — I220 Subsequent ST elevation (STEMI) myocardial infarction of anterior wall: Secondary | ICD-10-CM | POA: Diagnosis not present

## 2020-01-02 ENCOUNTER — Telehealth: Payer: Self-pay | Admitting: Podiatry

## 2020-01-02 DIAGNOSIS — I220 Subsequent ST elevation (STEMI) myocardial infarction of anterior wall: Secondary | ICD-10-CM | POA: Diagnosis not present

## 2020-01-02 NOTE — Telephone Encounter (Signed)
Pt called to say that her boot feels damp and should she come to be seen Dos 12/27/19  Pt states that she is concerened of the dampness of her boot

## 2020-01-02 NOTE — Telephone Encounter (Signed)
Yes, have her come in for a dressing change by any of the docs. Thanks, Dr. Amalia Hailey

## 2020-01-03 ENCOUNTER — Telehealth: Payer: Self-pay | Admitting: Podiatry

## 2020-01-03 DIAGNOSIS — I220 Subsequent ST elevation (STEMI) myocardial infarction of anterior wall: Secondary | ICD-10-CM | POA: Diagnosis not present

## 2020-01-03 NOTE — Telephone Encounter (Signed)
Called pt on 01/03/20 to offer appt to come in and have bandage changed pt stated she did not need the appt that her bandages were dry.
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
## 2020-01-04 ENCOUNTER — Other Ambulatory Visit: Payer: Self-pay | Admitting: Podiatry

## 2020-01-04 ENCOUNTER — Encounter: Payer: Medicaid Other | Admitting: Podiatry

## 2020-01-04 ENCOUNTER — Telehealth: Payer: Self-pay | Admitting: Podiatry

## 2020-01-04 DIAGNOSIS — I220 Subsequent ST elevation (STEMI) myocardial infarction of anterior wall: Secondary | ICD-10-CM | POA: Diagnosis not present

## 2020-01-04 DIAGNOSIS — M2042 Other hammer toe(s) (acquired), left foot: Secondary | ICD-10-CM

## 2020-01-04 NOTE — Telephone Encounter (Signed)
Pt called at 11;38 statingh that she was tired and her foot seemed to be doing fine and that she will be in for her appt on 01/07/20 @8 ;15. I asked pt was she sure if there was any dampness or wet feeling then she would need to come for her appt today I also let pt know that we will not be in the office over the weekend, so if she had any of those symptoms just to come for hwer appt today she said no she was fine we will see her on Monday 01/07/20 pt canceled her appt for today she did state there was some swelling and that was it.

## 2020-01-05 DIAGNOSIS — I220 Subsequent ST elevation (STEMI) myocardial infarction of anterior wall: Secondary | ICD-10-CM | POA: Diagnosis not present

## 2020-01-06 DIAGNOSIS — I220 Subsequent ST elevation (STEMI) myocardial infarction of anterior wall: Secondary | ICD-10-CM | POA: Diagnosis not present

## 2020-01-07 ENCOUNTER — Other Ambulatory Visit: Payer: Self-pay

## 2020-01-07 ENCOUNTER — Ambulatory Visit (INDEPENDENT_AMBULATORY_CARE_PROVIDER_SITE_OTHER): Payer: Medicaid Other | Admitting: Podiatry

## 2020-01-07 DIAGNOSIS — M79672 Pain in left foot: Secondary | ICD-10-CM

## 2020-01-07 DIAGNOSIS — I220 Subsequent ST elevation (STEMI) myocardial infarction of anterior wall: Secondary | ICD-10-CM | POA: Diagnosis not present

## 2020-01-07 DIAGNOSIS — Z9889 Other specified postprocedural states: Secondary | ICD-10-CM

## 2020-01-07 DIAGNOSIS — M2042 Other hammer toe(s) (acquired), left foot: Secondary | ICD-10-CM

## 2020-01-07 DIAGNOSIS — M2012 Hallux valgus (acquired), left foot: Secondary | ICD-10-CM

## 2020-01-07 MED ORDER — OXYCODONE-ACETAMINOPHEN 5-325 MG PO TABS: 1.0000 | ORAL_TABLET | Freq: Four times a day (QID) | ORAL | 0 refills | Status: DC | PRN

## 2020-01-07 MED ORDER — DOXYCYCLINE HYCLATE 100 MG PO TABS: 100.0000 mg | ORAL_TABLET | Freq: Two times a day (BID) | ORAL | 0 refills | Status: DC

## 2020-01-08 DIAGNOSIS — I220 Subsequent ST elevation (STEMI) myocardial infarction of anterior wall: Secondary | ICD-10-CM | POA: Diagnosis not present

## 2020-01-09 ENCOUNTER — Telehealth: Payer: Self-pay | Admitting: *Deleted

## 2020-01-09 ENCOUNTER — Ambulatory Visit: Payer: Medicaid Other | Admitting: Family Medicine

## 2020-01-09 DIAGNOSIS — I220 Subsequent ST elevation (STEMI) myocardial infarction of anterior wall: Secondary | ICD-10-CM | POA: Diagnosis not present

## 2020-01-09 NOTE — Telephone Encounter (Signed)
No entry 

## 2020-01-09 NOTE — Telephone Encounter (Signed)
Pt asked if it was okay to lay on her side and elevate the foot.

## 2020-01-09 NOTE — Telephone Encounter (Signed)
I told pt that if she was comfortable she could elevate the foot that way. Pt asked if the boot sometimes got damp. I told her it could and she could rest without the boot on and but needed to sleep and walk in the boot.

## 2020-01-10 DIAGNOSIS — I220 Subsequent ST elevation (STEMI) myocardial infarction of anterior wall: Secondary | ICD-10-CM | POA: Diagnosis not present
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
## 2020-01-11 DIAGNOSIS — I220 Subsequent ST elevation (STEMI) myocardial infarction of anterior wall: Secondary | ICD-10-CM | POA: Diagnosis not present

## 2020-01-12 DIAGNOSIS — I220 Subsequent ST elevation (STEMI) myocardial infarction of anterior wall: Secondary | ICD-10-CM | POA: Diagnosis not present

## 2020-01-13 DIAGNOSIS — I220 Subsequent ST elevation (STEMI) myocardial infarction of anterior wall: Secondary | ICD-10-CM | POA: Diagnosis not present

## 2020-01-14 ENCOUNTER — Ambulatory Visit (INDEPENDENT_AMBULATORY_CARE_PROVIDER_SITE_OTHER): Payer: Medicaid Other | Admitting: Podiatry

## 2020-01-14 ENCOUNTER — Other Ambulatory Visit: Payer: Self-pay

## 2020-01-14 DIAGNOSIS — M2012 Hallux valgus (acquired), left foot: Secondary | ICD-10-CM

## 2020-01-14 DIAGNOSIS — I220 Subsequent ST elevation (STEMI) myocardial infarction of anterior wall: Secondary | ICD-10-CM | POA: Diagnosis not present

## 2020-01-14 DIAGNOSIS — G8929 Other chronic pain: Secondary | ICD-10-CM

## 2020-01-14 DIAGNOSIS — M2042 Other hammer toe(s) (acquired), left foot: Secondary | ICD-10-CM

## 2020-01-14 DIAGNOSIS — Z853 Personal history of malignant neoplasm of breast: Secondary | ICD-10-CM

## 2020-01-14 DIAGNOSIS — M792 Neuralgia and neuritis, unspecified: Secondary | ICD-10-CM

## 2020-01-14 DIAGNOSIS — I10 Essential (primary) hypertension: Secondary | ICD-10-CM

## 2020-01-14 DIAGNOSIS — Z9889 Other specified postprocedural states: Secondary | ICD-10-CM

## 2020-01-14 MED ORDER — ISOSORBIDE MONONITRATE ER 60 MG PO TB24: 60.0000 mg | ORAL_TABLET | Freq: Every day | ORAL | 0 refills | Status: DC

## 2020-01-14 MED ORDER — OXYCODONE-ACETAMINOPHEN 5-325 MG PO TABS: 1.0000 | ORAL_TABLET | Freq: Four times a day (QID) | ORAL | 0 refills | Status: DC | PRN

## 2020-01-14 NOTE — Telephone Encounter (Signed)
Pt is calling back to check the status of her request for her heart medication. SHe states that she needs this today and that the pharmacy has been faxing [name] since last week. jw

## 2020-01-15 DIAGNOSIS — I220 Subsequent ST elevation (STEMI) myocardial infarction of anterior wall: Secondary | ICD-10-CM | POA: Diagnosis not present

## 2020-01-16 DIAGNOSIS — I220 Subsequent ST elevation (STEMI) myocardial infarction of anterior wall: Secondary | ICD-10-CM | POA: Diagnosis not present
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
## 2020-01-17 DIAGNOSIS — I220 Subsequent ST elevation (STEMI) myocardial infarction of anterior wall: Secondary | ICD-10-CM | POA: Diagnosis not present

## 2020-01-18 DIAGNOSIS — I220 Subsequent ST elevation (STEMI) myocardial infarction of anterior wall: Secondary | ICD-10-CM | POA: Diagnosis not present

## 2020-01-19 DIAGNOSIS — I220 Subsequent ST elevation (STEMI) myocardial infarction of anterior wall: Secondary | ICD-10-CM | POA: Diagnosis not present

## 2020-01-20 DIAGNOSIS — I220 Subsequent ST elevation (STEMI) myocardial infarction of anterior wall: Secondary | ICD-10-CM | POA: Diagnosis not present

## 2020-01-21 DIAGNOSIS — I220 Subsequent ST elevation (STEMI) myocardial infarction of anterior wall: Secondary | ICD-10-CM | POA: Diagnosis not present

## 2020-01-22 DIAGNOSIS — I220 Subsequent ST elevation (STEMI) myocardial infarction of anterior wall: Secondary | ICD-10-CM | POA: Diagnosis not present

## 2020-01-23 ENCOUNTER — Ambulatory Visit (INDEPENDENT_AMBULATORY_CARE_PROVIDER_SITE_OTHER): Payer: Medicaid Other

## 2020-01-23 ENCOUNTER — Ambulatory Visit (INDEPENDENT_AMBULATORY_CARE_PROVIDER_SITE_OTHER): Payer: Medicaid Other | Admitting: Podiatry

## 2020-01-23 ENCOUNTER — Other Ambulatory Visit: Payer: Self-pay

## 2020-01-23 DIAGNOSIS — M2042 Other hammer toe(s) (acquired), left foot: Secondary | ICD-10-CM

## 2020-01-23 DIAGNOSIS — Z9889 Other specified postprocedural states: Secondary | ICD-10-CM

## 2020-01-23 DIAGNOSIS — I220 Subsequent ST elevation (STEMI) myocardial infarction of anterior wall: Secondary | ICD-10-CM | POA: Diagnosis not present

## 2020-01-23 DIAGNOSIS — M2012 Hallux valgus (acquired), left foot: Secondary | ICD-10-CM

## 2020-01-23 MED ORDER — OXYCODONE-ACETAMINOPHEN 5-325 MG PO TABS: 1.0000 | ORAL_TABLET | Freq: Four times a day (QID) | ORAL | 0 refills | Status: DC | PRN

## 2020-01-24 ENCOUNTER — Telehealth: Payer: Self-pay | Admitting: *Deleted

## 2020-01-24 DIAGNOSIS — I220 Subsequent ST elevation (STEMI) myocardial infarction of anterior wall: Secondary | ICD-10-CM | POA: Diagnosis not present

## 2020-01-24 NOTE — Telephone Encounter (Signed)
Patient called and stated that she had a cancer appointment tomorrow and was wandering if she could drive with the surgical shoe and I stated to be very careful driving and just to make sure the shoe doesn't get stuck at the gas or brake pedals. [name]

## 2020-01-25 ENCOUNTER — Inpatient Hospital Stay: Payer: Medicaid Other | Admitting: Adult Health

## 2020-01-25 ENCOUNTER — Telehealth: Payer: Self-pay | Admitting: Adult Health

## 2020-01-25 ENCOUNTER — Inpatient Hospital Stay: Payer: Medicaid Other

## 2020-01-25 ENCOUNTER — Other Ambulatory Visit: Payer: Self-pay | Admitting: Oncology

## 2020-01-25 DIAGNOSIS — I10 Essential (primary) hypertension: Secondary | ICD-10-CM

## 2020-01-25 DIAGNOSIS — I220 Subsequent ST elevation (STEMI) myocardial infarction of anterior wall: Secondary | ICD-10-CM | POA: Diagnosis not present

## 2020-01-25 DIAGNOSIS — G8929 Other chronic pain: Secondary | ICD-10-CM

## 2020-01-25 DIAGNOSIS — M792 Neuralgia and neuritis, unspecified: Secondary | ICD-10-CM

## 2020-01-25 DIAGNOSIS — Z853 Personal history of malignant neoplasm of breast: Secondary | ICD-10-CM

## 2020-01-25 MED ORDER — METHADONE HCL 5 MG PO TABS: ORAL_TABLET | ORAL | 0 refills | Status: DC

## 2020-01-25 NOTE — Telephone Encounter (Signed)
Rescheduled per 2/5 sch msg (pt req). Called and spoke with pt, confirmed 2/17 appt
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
## 2020-01-26 DIAGNOSIS — I220 Subsequent ST elevation (STEMI) myocardial infarction of anterior wall: Secondary | ICD-10-CM | POA: Diagnosis not present

## 2020-01-27 DIAGNOSIS — I220 Subsequent ST elevation (STEMI) myocardial infarction of anterior wall: Secondary | ICD-10-CM | POA: Diagnosis not present
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
## 2020-01-28 DIAGNOSIS — I220 Subsequent ST elevation (STEMI) myocardial infarction of anterior wall: Secondary | ICD-10-CM | POA: Diagnosis not present

## 2020-01-29 DIAGNOSIS — I220 Subsequent ST elevation (STEMI) myocardial infarction of anterior wall: Secondary | ICD-10-CM | POA: Diagnosis not present

## 2020-01-30 ENCOUNTER — Other Ambulatory Visit: Payer: Self-pay

## 2020-01-30 ENCOUNTER — Ambulatory Visit (INDEPENDENT_AMBULATORY_CARE_PROVIDER_SITE_OTHER): Payer: Medicaid Other | Admitting: Podiatry

## 2020-01-30 DIAGNOSIS — M2042 Other hammer toe(s) (acquired), left foot: Secondary | ICD-10-CM

## 2020-01-30 DIAGNOSIS — I220 Subsequent ST elevation (STEMI) myocardial infarction of anterior wall: Secondary | ICD-10-CM | POA: Diagnosis not present

## 2020-01-30 DIAGNOSIS — Z9889 Other specified postprocedural states: Secondary | ICD-10-CM

## 2020-01-30 DIAGNOSIS — M2012 Hallux valgus (acquired), left foot: Secondary | ICD-10-CM

## 2020-01-30 MED ORDER — OXYCODONE-ACETAMINOPHEN 5-325 MG PO TABS: 1.0000 | ORAL_TABLET | Freq: Four times a day (QID) | ORAL | 0 refills | Status: DC | PRN

## 2020-01-31 DIAGNOSIS — I220 Subsequent ST elevation (STEMI) myocardial infarction of anterior wall: Secondary | ICD-10-CM | POA: Diagnosis not present

## 2020-02-01 ENCOUNTER — Ambulatory Visit: Payer: Medicaid Other | Admitting: Family Medicine

## 2020-02-01 DIAGNOSIS — I220 Subsequent ST elevation (STEMI) myocardial infarction of anterior wall: Secondary | ICD-10-CM | POA: Diagnosis not present

## 2020-02-02 DIAGNOSIS — I220 Subsequent ST elevation (STEMI) myocardial infarction of anterior wall: Secondary | ICD-10-CM | POA: Diagnosis not present

## 2020-02-03 DIAGNOSIS — I220 Subsequent ST elevation (STEMI) myocardial infarction of anterior wall: Secondary | ICD-10-CM | POA: Diagnosis not present

## 2020-02-04 ENCOUNTER — Telehealth: Payer: Self-pay

## 2020-02-04 DIAGNOSIS — I220 Subsequent ST elevation (STEMI) myocardial infarction of anterior wall: Secondary | ICD-10-CM | POA: Diagnosis not present
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
## 2020-02-04 NOTE — Telephone Encounter (Signed)
Pt called requesting to know when she can start getting her foot wet? Please advice

## 2020-02-05 ENCOUNTER — Other Ambulatory Visit: Payer: Self-pay

## 2020-02-05 ENCOUNTER — Telehealth: Payer: Self-pay | Admitting: Adult Health

## 2020-02-05 DIAGNOSIS — I220 Subsequent ST elevation (STEMI) myocardial infarction of anterior wall: Secondary | ICD-10-CM | POA: Diagnosis not present

## 2020-02-05 DIAGNOSIS — C50412 Malignant neoplasm of upper-outer quadrant of left female breast: Secondary | ICD-10-CM

## 2020-02-05 NOTE — Telephone Encounter (Signed)
Returned patient's phone call regarding rescheduling 02/17 appointment, per patient's request appointment has moved to 03/15.

## 2020-02-06 ENCOUNTER — Telehealth: Payer: Self-pay | Admitting: Podiatry

## 2020-02-06 ENCOUNTER — Inpatient Hospital Stay: Payer: Medicaid Other

## 2020-02-06 ENCOUNTER — Inpatient Hospital Stay: Payer: Medicaid Other | Admitting: Adult Health

## 2020-02-06 ENCOUNTER — Other Ambulatory Visit: Payer: Self-pay | Admitting: Podiatry

## 2020-02-06 DIAGNOSIS — I220 Subsequent ST elevation (STEMI) myocardial infarction of anterior wall: Secondary | ICD-10-CM | POA: Diagnosis not present

## 2020-02-06 MED ORDER — OXYCODONE-ACETAMINOPHEN 5-325 MG PO TABS: 1.0000 | ORAL_TABLET | Freq: Four times a day (QID) | ORAL | 0 refills | Status: DC | PRN

## 2020-02-06 NOTE — Telephone Encounter (Signed)
Rx sent. - Dr. Licia Harl

## 2020-02-06 NOTE — Telephone Encounter (Signed)
Pt called veru upset she wants her refill on pain medication

## 2020-02-06 NOTE — Telephone Encounter (Signed)
Pt called requesting refill of Oxycodone, please send to [facility] on [address]

## 2020-02-06 NOTE — Progress Notes (Signed)
PRN postop 

## 2020-02-06 NOTE — Telephone Encounter (Signed)
Pt. Called again.

## 2020-02-06 NOTE — Telephone Encounter (Signed)
Pt called to ask for refill on the pain medicine [facility] [address]

## 2020-02-07 DIAGNOSIS — I220 Subsequent ST elevation (STEMI) myocardial infarction of anterior wall: Secondary | ICD-10-CM | POA: Diagnosis not present

## 2020-02-08 DIAGNOSIS — I220 Subsequent ST elevation (STEMI) myocardial infarction of anterior wall: Secondary | ICD-10-CM | POA: Diagnosis not present

## 2020-02-09 DIAGNOSIS — I220 Subsequent ST elevation (STEMI) myocardial infarction of anterior wall: Secondary | ICD-10-CM | POA: Diagnosis not present

## 2020-02-10 DIAGNOSIS — I220 Subsequent ST elevation (STEMI) myocardial infarction of anterior wall: Secondary | ICD-10-CM | POA: Diagnosis not present

## 2020-02-11 DIAGNOSIS — I220 Subsequent ST elevation (STEMI) myocardial infarction of anterior wall: Secondary | ICD-10-CM | POA: Diagnosis not present

## 2020-02-12 ENCOUNTER — Other Ambulatory Visit: Payer: Self-pay | Admitting: *Deleted

## 2020-02-12 DIAGNOSIS — I220 Subsequent ST elevation (STEMI) myocardial infarction of anterior wall: Secondary | ICD-10-CM | POA: Diagnosis not present

## 2020-02-12 MED ORDER — AMLODIPINE BESYLATE 5 MG PO TABS: 5.0000 mg | ORAL_TABLET | Freq: Every day | ORAL | 3 refills | Status: DC

## 2020-02-13 ENCOUNTER — Telehealth: Payer: Self-pay | Admitting: Podiatry

## 2020-02-13 ENCOUNTER — Other Ambulatory Visit: Payer: Self-pay | Admitting: Podiatry

## 2020-02-13 DIAGNOSIS — I220 Subsequent ST elevation (STEMI) myocardial infarction of anterior wall: Secondary | ICD-10-CM | POA: Diagnosis not present

## 2020-02-13 MED ORDER — OXYCODONE-ACETAMINOPHEN 5-325 MG PO TABS: 1.0000 | ORAL_TABLET | Freq: Four times a day (QID) | ORAL | 0 refills | Status: DC | PRN

## 2020-02-13 NOTE — Telephone Encounter (Signed)
Pt called asking if Dr.Evans would please refill medication

## 2020-02-13 NOTE — Telephone Encounter (Signed)
Pt called requesting a refill of Oxycodone. She states the swelling has gone down some but is still having pain

## 2020-02-13 NOTE — Telephone Encounter (Signed)
I spoke with pt she states the area where the stitches were is a 10 throbbing pain, and denied increased redness or swelling.

## 2020-02-13 NOTE — Progress Notes (Signed)
PRN postop pain 

## 2020-02-13 NOTE — Telephone Encounter (Signed)
Sent!

## 2020-02-14 DIAGNOSIS — I220 Subsequent ST elevation (STEMI) myocardial infarction of anterior wall: Secondary | ICD-10-CM | POA: Diagnosis not present

## 2020-02-14 NOTE — Telephone Encounter (Signed)
I informed pt the pain medication` had been sent to her pharmacy.

## 2020-02-15 ENCOUNTER — Ambulatory Visit: Payer: Medicaid Other | Admitting: Family Medicine

## 2020-02-15 DIAGNOSIS — I220 Subsequent ST elevation (STEMI) myocardial infarction of anterior wall: Secondary | ICD-10-CM | POA: Diagnosis not present

## 2020-02-16 DIAGNOSIS — I220 Subsequent ST elevation (STEMI) myocardial infarction of anterior wall: Secondary | ICD-10-CM | POA: Diagnosis not present

## 2020-02-17 DIAGNOSIS — I220 Subsequent ST elevation (STEMI) myocardial infarction of anterior wall: Secondary | ICD-10-CM | POA: Diagnosis not present

## 2020-02-18 DIAGNOSIS — I220 Subsequent ST elevation (STEMI) myocardial infarction of anterior wall: Secondary | ICD-10-CM | POA: Diagnosis not present

## 2020-02-19 ENCOUNTER — Telehealth: Payer: Self-pay | Admitting: Podiatry

## 2020-02-19 ENCOUNTER — Other Ambulatory Visit: Payer: Self-pay | Admitting: *Deleted

## 2020-02-19 ENCOUNTER — Telehealth: Payer: Self-pay | Admitting: *Deleted

## 2020-02-19 DIAGNOSIS — I220 Subsequent ST elevation (STEMI) myocardial infarction of anterior wall: Secondary | ICD-10-CM | POA: Diagnosis not present

## 2020-02-19 NOTE — Telephone Encounter (Signed)
Pt asked for refill of the pain medication for tomorrow morning her 3 toes ache constantly.

## 2020-02-19 NOTE — Telephone Encounter (Signed)
Pt called about pain medicine explained to patient about pharmacy needing to request the refill.

## 2020-02-20 ENCOUNTER — Other Ambulatory Visit: Payer: Self-pay | Admitting: Podiatry

## 2020-02-20 DIAGNOSIS — I220 Subsequent ST elevation (STEMI) myocardial infarction of anterior wall: Secondary | ICD-10-CM | POA: Diagnosis not present

## 2020-02-20 MED ORDER — ALLOPURINOL 300 MG PO TABS: 300.0000 mg | ORAL_TABLET | Freq: Every day | ORAL | 2 refills | Status: DC

## 2020-02-20 MED ORDER — OXYCODONE-ACETAMINOPHEN 5-325 MG PO TABS: 1.0000 | ORAL_TABLET | Freq: Four times a day (QID) | ORAL | 0 refills | Status: DC | PRN

## 2020-02-20 NOTE — Telephone Encounter (Signed)
Pt called to make sure Dr. Amalia Hailey would fill the oxycodone the aching level 10 pain she is having in her last 3 toes, the swelling has gone down.

## 2020-02-20 NOTE — Telephone Encounter (Signed)
Pt spoke with Donita Brooks, front reception to see if Dr. Amalia Hailey is going to fill her oxycodone.

## 2020-02-20 NOTE — Telephone Encounter (Signed)
Rx sent 

## 2020-02-20 NOTE — Telephone Encounter (Signed)
Pt called following up on refill request for Oxycodone

## 2020-02-20 NOTE — Progress Notes (Signed)
PRN postop pain 

## 2020-02-20 NOTE — Telephone Encounter (Signed)
I informed pt the oxycodone had been sent.

## 2020-02-21 ENCOUNTER — Telehealth: Payer: Self-pay | Admitting: *Deleted

## 2020-02-21 DIAGNOSIS — I220 Subsequent ST elevation (STEMI) myocardial infarction of anterior wall: Secondary | ICD-10-CM | POA: Diagnosis not present

## 2020-02-21 NOTE — Telephone Encounter (Signed)
Pt called to this RN to state she has noted for " maybe 2 weeks - my face on the left side seems larger "  " I am seeing ya'll soon and just want to let you know "  She describes location as on the left side under her chin.  She states " it feels firmer then the other side "  She denies any pain associated with above.  She does state she tends to sleep more on her left side.  She has had known left arm lymphedema with cellulitis in the past.  Presently she stated the left arm is normal.  Per review with mid level this RN offered for pt to come in tomorrow for evaluation per her current appointment is 03/03/2020.  She declined visit and states she has 2 other MD appointments tomorrow including one with her primary MD.  This RN advised her to show her primary MD above and if further concerned to call this office.  Otis verbalized understanding.

## 2020-02-22 ENCOUNTER — Other Ambulatory Visit: Payer: Self-pay | Admitting: Oncology

## 2020-02-22 ENCOUNTER — Encounter: Payer: Self-pay | Admitting: Family Medicine

## 2020-02-22 ENCOUNTER — Other Ambulatory Visit: Payer: Self-pay

## 2020-02-22 ENCOUNTER — Ambulatory Visit (INDEPENDENT_AMBULATORY_CARE_PROVIDER_SITE_OTHER): Payer: Medicaid Other | Admitting: Family Medicine

## 2020-02-22 VITALS — BP 114/72 | HR 73 | Wt 237.8 lb

## 2020-02-22 DIAGNOSIS — M792 Neuralgia and neuritis, unspecified: Secondary | ICD-10-CM

## 2020-02-22 DIAGNOSIS — Z853 Personal history of malignant neoplasm of breast: Secondary | ICD-10-CM

## 2020-02-22 DIAGNOSIS — R220 Localized swelling, mass and lump, head: Secondary | ICD-10-CM | POA: Insufficient documentation

## 2020-02-22 DIAGNOSIS — E876 Hypokalemia: Secondary | ICD-10-CM | POA: Diagnosis not present

## 2020-02-22 DIAGNOSIS — G8929 Other chronic pain: Secondary | ICD-10-CM

## 2020-02-22 DIAGNOSIS — I10 Essential (primary) hypertension: Secondary | ICD-10-CM

## 2020-02-22 DIAGNOSIS — Z87898 Personal history of other specified conditions: Secondary | ICD-10-CM | POA: Diagnosis not present

## 2020-02-22 DIAGNOSIS — I220 Subsequent ST elevation (STEMI) myocardial infarction of anterior wall: Secondary | ICD-10-CM | POA: Diagnosis not present

## 2020-02-22 MED ORDER — METHADONE HCL 5 MG PO TABS: ORAL_TABLET | ORAL | 0 refills | Status: DC

## 2020-02-22 MED FILL — METHADONE HCL 5 MG TABLET: 5 | 30 days supply | Qty: 270 | Fill #0

## 2020-02-22 NOTE — Assessment & Plan Note (Signed)
It is difficult for me to notice any swelling today, although it is possible that the patient would be more perceptive to swelling that I would be.  I encouraged patient to see her dentist, but since she does not have pain in her teeth or gums, she likely does not have a dental problem causing swelling.  Reassured that I do not feel any large lymph nodes.  I would be doubtful that this would be related to her left arm lymphedema, but her oncologist would have a better expertise in this area.  Will obtain a TSH today, but I do not think that further imaging is indicated at this time.  Provided reassurance to patient and encouraged her to continue to see her oncologist to get his opinion.  I am always happy to reevaluate this area if it becomes worse.

## 2020-02-22 NOTE — Patient Instructions (Addendum)
It was nice seeing you today Ms. Meskill!  I do not see anything concerning on exam regarding your swelling.  I think we should continue to watch this closely and I would like for you to have a low threshold to come back and see me if you notice an increase in swelling or pain in the area.  You can certainly see your oncologist about this as well.  I am going to check your thyroid hormone and your electrolytes to make sure they are normal today.  You would benefit from having CT scan for screening of lung cancer around July of this year.  Please call us at this time and we can order that for you.   If you have any questions or concerns, please feel free to call the clinic.   Be well,  Dr. Shan Levans

## 2020-02-22 NOTE — Progress Notes (Signed)
    SUBJECTIVE:   CHIEF COMPLAINT / HPI:   Swelling on L side of face First noticed a month ago Never occurred before Looks dark on the inside of her mouth No swollen nodules in the neck No known trauma to this area Patient does sleep on her left side No other swelling in other parts of her body Does feel more tired than usual Denies weight loss, bone pain, new nights sweats (endorses chronic hot flashes) No pain in teeth or gums, but has not seen the dentist in several years  Tobacco use Smokes about 8 cigarettes daily Would like to quit Got down to 2 cigarettes daily Enjoys the taste of cigarettes and finds that they are enjoyable when she is drinking her coffee  PERTINENT  PMH / PSH: Left-sided breast cancer with axillary lymph node dissection, tobacco use disorder  OBJECTIVE:   BP 114/72   Pulse 73   Wt 237 lb 12.8 oz (107.9 kg)   LMP 08/07/2011   SpO2 97%   BMI 39.57 kg/m   General: well appearing, appears stated age 14: No obvious visual abnormality or swelling, no lymphadenopathy or other masses palpated in the anterior cervical, submental, submandibular, or supraclavicular areas, no tooth abscess or gingivitis noted, although patient's dentition is fair.  No thyroid nodules palpated, no goiter noted. Cardiac: RRR, no MRG Respiratory: CTAB, no rhonchi, rales, or wheezing, normal work of breathing Skin: no rashes or other lesions, warm and well perfused Psych: appropriate mood and affect    ASSESSMENT/PLAN:   Left facial swelling It is difficult for me to notice any swelling today, although it is possible that the patient would be more perceptive to swelling that I would be.  I encouraged patient to see her dentist, but since she does not have pain in her teeth or gums, she likely does not have a dental problem causing swelling.  Reassured that I do not feel any large lymph nodes.  I would be doubtful that this would be related to her left arm lymphedema, but  her oncologist would have a better expertise in this area.  Will obtain a TSH today, but I do not think that further imaging is indicated at this time.  Provided reassurance to patient and encouraged her to continue to see her oncologist to get his opinion.  I am always happy to reevaluate this area if it becomes worse.  Hypokalemia Potassium has been 3.4 on recent BMPs.  Due to history of more severe hypokalemia, will obtain a BMP today.     [name], MD [place]

## 2020-02-22 NOTE — Assessment & Plan Note (Signed)
Potassium has been 3.4 on recent BMPs.  Due to history of more severe hypokalemia, will obtain a BMP today.

## 2020-02-23 DIAGNOSIS — I220 Subsequent ST elevation (STEMI) myocardial infarction of anterior wall: Secondary | ICD-10-CM | POA: Diagnosis not present

## 2020-02-23 LAB — BASIC METABOLIC PANEL
BUN/Creatinine Ratio: 15 (ref 9–23)
BUN: 13 mg/dL (ref 6–24)
CO2: 25 mmol/L (ref 20–29)
Calcium: 10 mg/dL (ref 8.7–10.2)
Chloride: 101 mmol/L (ref 96–106)
Creatinine, Ser: 0.88 mg/dL (ref 0.57–1.00)
GFR calc Af Amer: 85 mL/min/{1.73_m2} (ref >59)
GFR calc non Af Amer: 74 mL/min/{1.73_m2} (ref >59)
Glucose: 84 mg/dL (ref 65–99)
Potassium: 3.5 mmol/L (ref 3.5–5.2)
Sodium: 141 mmol/L (ref 134–144)

## 2020-02-23 LAB — TSH: TSH: 2.4 u[IU]/mL (ref 0.450–4.500)

## 2020-02-24 DIAGNOSIS — I220 Subsequent ST elevation (STEMI) myocardial infarction of anterior wall: Secondary | ICD-10-CM | POA: Diagnosis not present

## 2020-02-25 ENCOUNTER — Ambulatory Visit (INDEPENDENT_AMBULATORY_CARE_PROVIDER_SITE_OTHER): Payer: Medicaid Other | Admitting: Podiatry

## 2020-02-25 ENCOUNTER — Other Ambulatory Visit: Payer: Self-pay

## 2020-02-25 DIAGNOSIS — Z9889 Other specified postprocedural states: Secondary | ICD-10-CM

## 2020-02-25 DIAGNOSIS — M2042 Other hammer toe(s) (acquired), left foot: Secondary | ICD-10-CM

## 2020-02-25 DIAGNOSIS — I220 Subsequent ST elevation (STEMI) myocardial infarction of anterior wall: Secondary | ICD-10-CM | POA: Diagnosis not present

## 2020-02-25 MED ORDER — OXYCODONE-ACETAMINOPHEN 5-325 MG PO TABS: 1.0000 | ORAL_TABLET | Freq: Four times a day (QID) | ORAL | 0 refills | Status: DC | PRN

## 2020-02-26 DIAGNOSIS — I220 Subsequent ST elevation (STEMI) myocardial infarction of anterior wall: Secondary | ICD-10-CM | POA: Diagnosis not present

## 2020-02-27 ENCOUNTER — Encounter: Payer: Medicaid Other | Admitting: Podiatry

## 2020-02-27 DIAGNOSIS — I220 Subsequent ST elevation (STEMI) myocardial infarction of anterior wall: Secondary | ICD-10-CM | POA: Diagnosis not present

## 2020-02-28 DIAGNOSIS — I220 Subsequent ST elevation (STEMI) myocardial infarction of anterior wall: Secondary | ICD-10-CM | POA: Diagnosis not present
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
## 2020-02-29 ENCOUNTER — Telehealth: Payer: Self-pay | Admitting: *Deleted

## 2020-02-29 DIAGNOSIS — I220 Subsequent ST elevation (STEMI) myocardial infarction of anterior wall: Secondary | ICD-10-CM | POA: Diagnosis not present

## 2020-02-29 DIAGNOSIS — M79672 Pain in left foot: Secondary | ICD-10-CM

## 2020-02-29 DIAGNOSIS — M2012 Hallux valgus (acquired), left foot: Secondary | ICD-10-CM

## 2020-02-29 DIAGNOSIS — M2042 Other hammer toe(s) (acquired), left foot: Secondary | ICD-10-CM

## 2020-02-29 DIAGNOSIS — Z9889 Other specified postprocedural states: Secondary | ICD-10-CM

## 2020-02-29 NOTE — Telephone Encounter (Signed)
[facility] - [facility] states they do not accept pt's insurance. Faxed referral, and demographics to [facility].

## 2020-03-01 DIAGNOSIS — I220 Subsequent ST elevation (STEMI) myocardial infarction of anterior wall: Secondary | ICD-10-CM | POA: Diagnosis not present

## 2020-03-02 DIAGNOSIS — I220 Subsequent ST elevation (STEMI) myocardial infarction of anterior wall: Secondary | ICD-10-CM | POA: Diagnosis not present

## 2020-03-03 ENCOUNTER — Telehealth: Payer: Self-pay | Admitting: Podiatry

## 2020-03-03 ENCOUNTER — Telehealth: Payer: Self-pay | Admitting: Adult Health

## 2020-03-03 ENCOUNTER — Inpatient Hospital Stay: Payer: Medicaid Other

## 2020-03-03 ENCOUNTER — Other Ambulatory Visit: Payer: Self-pay | Admitting: Podiatry

## 2020-03-03 ENCOUNTER — Inpatient Hospital Stay: Payer: Medicaid Other | Admitting: Adult Health

## 2020-03-03 DIAGNOSIS — I220 Subsequent ST elevation (STEMI) myocardial infarction of anterior wall: Secondary | ICD-10-CM | POA: Diagnosis not present

## 2020-03-03 MED ORDER — OXYCODONE-ACETAMINOPHEN 5-325 MG PO TABS: 1.0000 | ORAL_TABLET | Freq: Three times a day (TID) | ORAL | 0 refills | Status: DC | PRN

## 2020-03-03 NOTE — Telephone Encounter (Signed)
I informed pt she could call [facility] and she could call them to schedule. Pt states they just tried to call while she was in the bathroom. I told pt I would inform Dr. Amalia Hailey of her request of pain medication.

## 2020-03-03 NOTE — Telephone Encounter (Signed)
Moved 3/15 to 3/17 per patient request. Called and spoke with patient. Confirmed new date and time

## 2020-03-03 NOTE — Progress Notes (Signed)
PRN postop pain 

## 2020-03-03 NOTE — Telephone Encounter (Signed)
Rx sent. - Dr. Jahki Witham

## 2020-03-03 NOTE — Telephone Encounter (Signed)
Pt states Dr. Amalia Hailey wanted her to have therapy and to go ahead and schedule. Pt states the 2nd toe is coming up and not so swollen but still the same pain.

## 2020-03-03 NOTE — Telephone Encounter (Signed)
Pt called requesting a refill of pain medication to be sent in to her pharmacy on Wednesday.

## 2020-03-04 ENCOUNTER — Telehealth: Payer: Self-pay | Admitting: Podiatry

## 2020-03-04 DIAGNOSIS — I220 Subsequent ST elevation (STEMI) myocardial infarction of anterior wall: Secondary | ICD-10-CM | POA: Diagnosis not present
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
## 2020-03-04 NOTE — Telephone Encounter (Signed)
Please send patient pain medication [facility] by tomorrow. She is in a lot of pain

## 2020-03-04 NOTE — Telephone Encounter (Signed)
I informed pt the pain medication had been sent to her pharmacy to be picked up tomorrow. Pt states she has her 1st PT appt tomorrow.

## 2020-03-05 ENCOUNTER — Other Ambulatory Visit: Payer: Self-pay | Admitting: *Deleted

## 2020-03-05 ENCOUNTER — Telehealth: Payer: Self-pay | Admitting: *Deleted

## 2020-03-05 ENCOUNTER — Inpatient Hospital Stay: Payer: Medicaid Other | Admitting: Adult Health

## 2020-03-05 ENCOUNTER — Inpatient Hospital Stay: Payer: Medicaid Other

## 2020-03-05 ENCOUNTER — Telehealth: Payer: Self-pay | Admitting: Licensed Clinical Social Worker

## 2020-03-05 ENCOUNTER — Ambulatory Visit: Payer: Medicaid Other | Admitting: Physical Therapy

## 2020-03-05 DIAGNOSIS — I220 Subsequent ST elevation (STEMI) myocardial infarction of anterior wall: Secondary | ICD-10-CM | POA: Diagnosis not present

## 2020-03-05 LAB — BASIC METABOLIC PANEL
BUN/Creatinine Ratio: 8 — ABNORMAL LOW (ref 9–23)
BUN: 7 mg/dL (ref 6–24)
CO2: 20 mmol/L (ref 20–29)
Calcium: 9.2 mg/dL (ref 8.7–10.2)
Chloride: 103 mmol/L (ref 96–106)
Creatinine, Ser: 0.9 mg/dL (ref 0.57–1.00)
GFR calc Af Amer: 83 mL/min/{1.73_m2} (ref >59)
GFR calc non Af Amer: 72 mL/min/{1.73_m2} (ref >59)
Glucose: 127 mg/dL — ABNORMAL HIGH (ref 65–99)
Potassium: 4 mmol/L (ref 3.5–5.2)
Sodium: 140 mmol/L (ref 134–144)

## 2020-03-05 LAB — ALDOSTERONE + RENIN ACTIVITY W/ RATIO
ALDOS/RENIN RATIO: 44 — ABNORMAL HIGH (ref 0.0–30.0)
ALDOSTERONE: 15.8 ng/dL (ref 0.0–30.0)
Renin: 0.359 ng/mL/hr (ref 0.167–5.380)

## 2020-03-05 NOTE — Telephone Encounter (Signed)
LVM for patient asking for return call. Attempting to assess for any barriers to care as pt has missed/ rescheduled several appointments.   Edwinna Areola Chantelle Verdi , LCSW

## 2020-03-05 NOTE — Telephone Encounter (Signed)
Patient calling with questions concerning prescription changes to medicine(Oxycodone-ace),usually gets a 30 Day supply,now only 20 day available at pharmacy.    Returned call to patient and explained per Mateo Flow that the physician wanted to reduced the number of pills being  dispensed at this point. She should also  pay close attention to directions on pill bottle as well, may have changed. She verbalized understanding and stated ok.

## 2020-03-05 NOTE — Telephone Encounter (Signed)
-----   Message from Gardenia Phlegm, NP sent at 03/05/2020  9:50 AM EDT ----- Patient has missed several appointments.  Can you reach out to see if there are any barriers that we might could assist with?  Thanks!

## 2020-03-06 ENCOUNTER — Ambulatory Visit: Payer: Medicaid Other | Attending: Podiatry

## 2020-03-06 ENCOUNTER — Other Ambulatory Visit: Payer: Self-pay

## 2020-03-06 ENCOUNTER — Ambulatory Visit: Payer: Medicaid Other

## 2020-03-06 DIAGNOSIS — Z9889 Other specified postprocedural states: Secondary | ICD-10-CM | POA: Diagnosis present

## 2020-03-06 DIAGNOSIS — M2012 Hallux valgus (acquired), left foot: Secondary | ICD-10-CM | POA: Insufficient documentation

## 2020-03-06 DIAGNOSIS — R262 Difficulty in walking, not elsewhere classified: Secondary | ICD-10-CM | POA: Diagnosis present

## 2020-03-06 DIAGNOSIS — M6281 Muscle weakness (generalized): Secondary | ICD-10-CM | POA: Diagnosis present

## 2020-03-06 DIAGNOSIS — M2042 Other hammer toe(s) (acquired), left foot: Secondary | ICD-10-CM | POA: Diagnosis present

## 2020-03-06 DIAGNOSIS — I220 Subsequent ST elevation (STEMI) myocardial infarction of anterior wall: Secondary | ICD-10-CM | POA: Diagnosis not present

## 2020-03-06 NOTE — Telephone Encounter (Signed)
error 

## 2020-03-07 ENCOUNTER — Telehealth: Payer: Self-pay | Admitting: Licensed Clinical Social Worker

## 2020-03-07 DIAGNOSIS — I220 Subsequent ST elevation (STEMI) myocardial infarction of anterior wall: Secondary | ICD-10-CM | POA: Diagnosis not present

## 2020-03-07 NOTE — Telephone Encounter (Signed)
Received return call from Ms. Simms. She has been having trouble getting to appointments due to gas money recently. She has enough to get to the appointment on Tuesday. CSW discussed options including [employer] and looking into [place] transportation.   Will sign up pt for [place] fund on Tuesday and give contact information for [facility] transport.  Ms. Bill reported she will be at the appointment to get her neck looked at as it is swollen on one side.

## 2020-03-07 NOTE — Patient Instructions (Signed)
HEP for Toe wiggling, ankle pumps, ankle circles, and alphabet 2x a day

## 2020-03-08 DIAGNOSIS — I220 Subsequent ST elevation (STEMI) myocardial infarction of anterior wall: Secondary | ICD-10-CM | POA: Diagnosis not present
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
## 2020-03-09 DIAGNOSIS — I220 Subsequent ST elevation (STEMI) myocardial infarction of anterior wall: Secondary | ICD-10-CM | POA: Diagnosis not present

## 2020-03-10 DIAGNOSIS — I220 Subsequent ST elevation (STEMI) myocardial infarction of anterior wall: Secondary | ICD-10-CM | POA: Diagnosis not present
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
## 2020-03-11 ENCOUNTER — Inpatient Hospital Stay (HOSPITAL_BASED_OUTPATIENT_CLINIC_OR_DEPARTMENT_OTHER): Payer: Medicaid Other | Admitting: Adult Health

## 2020-03-11 ENCOUNTER — Encounter: Payer: Self-pay | Admitting: Adult Health

## 2020-03-11 ENCOUNTER — Telehealth: Payer: Self-pay | Admitting: *Deleted

## 2020-03-11 ENCOUNTER — Inpatient Hospital Stay: Payer: Medicaid Other | Admitting: Licensed Clinical Social Worker

## 2020-03-11 ENCOUNTER — Inpatient Hospital Stay: Payer: Medicaid Other | Attending: Adult Health

## 2020-03-11 ENCOUNTER — Other Ambulatory Visit: Payer: Self-pay

## 2020-03-11 VITALS — BP 118/71 | HR 70 | Temp 98.6°F | Ht 65.0 in | Wt 232.1 lb

## 2020-03-11 DIAGNOSIS — C50412 Malignant neoplasm of upper-outer quadrant of left female breast: Secondary | ICD-10-CM

## 2020-03-11 DIAGNOSIS — C50912 Malignant neoplasm of unspecified site of left female breast: Secondary | ICD-10-CM | POA: Insufficient documentation

## 2020-03-11 DIAGNOSIS — Z9221 Personal history of antineoplastic chemotherapy: Secondary | ICD-10-CM | POA: Diagnosis not present

## 2020-03-11 DIAGNOSIS — C50811 Malignant neoplasm of overlapping sites of right female breast: Secondary | ICD-10-CM | POA: Diagnosis not present

## 2020-03-11 DIAGNOSIS — R7989 Other specified abnormal findings of blood chemistry: Secondary | ICD-10-CM | POA: Diagnosis not present

## 2020-03-11 DIAGNOSIS — Z170 Estrogen receptor positive status [ER+]: Secondary | ICD-10-CM | POA: Insufficient documentation

## 2020-03-11 DIAGNOSIS — Z7982 Long term (current) use of aspirin: Secondary | ICD-10-CM | POA: Insufficient documentation

## 2020-03-11 DIAGNOSIS — R1900 Intra-abdominal and pelvic swelling, mass and lump, unspecified site: Secondary | ICD-10-CM | POA: Diagnosis not present

## 2020-03-11 DIAGNOSIS — D219 Benign neoplasm of connective and other soft tissue, unspecified: Secondary | ICD-10-CM | POA: Diagnosis not present

## 2020-03-11 DIAGNOSIS — C50911 Malignant neoplasm of unspecified site of right female breast: Secondary | ICD-10-CM | POA: Diagnosis not present

## 2020-03-11 DIAGNOSIS — M109 Gout, unspecified: Secondary | ICD-10-CM | POA: Diagnosis not present

## 2020-03-11 DIAGNOSIS — I2510 Atherosclerotic heart disease of native coronary artery without angina pectoris: Secondary | ICD-10-CM | POA: Diagnosis not present

## 2020-03-11 DIAGNOSIS — F17210 Nicotine dependence, cigarettes, uncomplicated: Secondary | ICD-10-CM | POA: Diagnosis not present

## 2020-03-11 DIAGNOSIS — I252 Old myocardial infarction: Secondary | ICD-10-CM | POA: Insufficient documentation

## 2020-03-11 DIAGNOSIS — Z79810 Long term (current) use of selective estrogen receptor modulators (SERMs): Secondary | ICD-10-CM | POA: Diagnosis not present

## 2020-03-11 DIAGNOSIS — C773 Secondary and unspecified malignant neoplasm of axilla and upper limb lymph nodes: Secondary | ICD-10-CM | POA: Diagnosis not present

## 2020-03-11 DIAGNOSIS — Z9013 Acquired absence of bilateral breasts and nipples: Secondary | ICD-10-CM | POA: Insufficient documentation

## 2020-03-11 DIAGNOSIS — G894 Chronic pain syndrome: Secondary | ICD-10-CM | POA: Diagnosis not present

## 2020-03-11 DIAGNOSIS — Z79899 Other long term (current) drug therapy: Secondary | ICD-10-CM | POA: Diagnosis not present

## 2020-03-11 DIAGNOSIS — I220 Subsequent ST elevation (STEMI) myocardial infarction of anterior wall: Secondary | ICD-10-CM | POA: Diagnosis not present

## 2020-03-11 DIAGNOSIS — Z923 Personal history of irradiation: Secondary | ICD-10-CM | POA: Diagnosis not present

## 2020-03-11 LAB — CBC WITH DIFFERENTIAL (CANCER CENTER ONLY)
Abs Immature Granulocytes: 0.04 10*3/uL (ref 0.00–0.07)
Basophils Absolute: 0 10*3/uL (ref 0.0–0.1)
Basophils Relative: 0 %
Eosinophils Absolute: 0.2 10*3/uL (ref 0.0–0.5)
Eosinophils Relative: 2 %
HCT: 45.7 % (ref 36.0–46.0)
Hemoglobin: 15 g/dL (ref 12.0–15.0)
Immature Granulocytes: 0 %
Lymphocytes Relative: 23 %
Lymphs Abs: 2.2 10*3/uL (ref 0.7–4.0)
MCH: 28.1 pg (ref 26.0–34.0)
MCHC: 32.8 g/dL (ref 30.0–36.0)
MCV: 85.6 fL (ref 80.0–100.0)
Monocytes Absolute: 0.8 10*3/uL (ref 0.1–1.0)
Monocytes Relative: 8 %
Neutro Abs: 6.3 10*3/uL (ref 1.7–7.7)
Neutrophils Relative %: 67 %
Platelet Count: 508 10*3/uL — ABNORMAL HIGH (ref 150–400)
RBC: 5.34 MIL/uL — ABNORMAL HIGH (ref 3.87–5.11)
RDW: 15.6 % — ABNORMAL HIGH (ref 11.5–15.5)
WBC Count: 9.6 10*3/uL (ref 4.0–10.5)
nRBC: 0 % (ref 0.0–0.2)

## 2020-03-11 LAB — CMP (CANCER CENTER ONLY)
ALT: 14 U/L (ref 0–44)
AST: 17 U/L (ref 15–41)
Albumin: 3.6 g/dL (ref 3.5–5.0)
Alkaline Phosphatase: 109 U/L (ref 38–126)
Anion gap: 11 (ref 5–15)
BUN: 13 mg/dL (ref 6–20)
CO2: 24 mmol/L (ref 22–32)
Calcium: 9 mg/dL (ref 8.9–10.3)
Chloride: 109 mmol/L (ref 98–111)
Creatinine: 0.82 mg/dL (ref 0.44–1.00)
GFR, Est AFR Am: >60 mL/min (ref >60)
GFR, Estimated: >60 mL/min (ref >60)
Glucose, Bld: 105 mg/dL — ABNORMAL HIGH (ref 70–99)
Potassium: 3.1 mmol/L — ABNORMAL LOW (ref 3.5–5.1)
Sodium: 144 mmol/L (ref 135–145)
Total Bilirubin: 0.3 mg/dL (ref 0.3–1.2)
Total Protein: 6.9 g/dL (ref 6.5–8.1)

## 2020-03-11 NOTE — Telephone Encounter (Signed)
Pt states she still has the same pain in the toes specially when positioning as instructed by Dr. Amalia Hailey and would like a refill of the pain medication, more than 20 this time.

## 2020-03-11 NOTE — Progress Notes (Signed)
[place] [employer]  CSW met with patient after appointment with NP to sign up for [employer]. Gave first installment and gave information on signing up for Medicaid transportation to help with gas money for non-[facility] appointments.   Edwinna Areola Hulda Reddix LCSW

## 2020-03-12 ENCOUNTER — Other Ambulatory Visit: Payer: Self-pay | Admitting: Podiatry

## 2020-03-12 ENCOUNTER — Telehealth: Payer: Self-pay | Admitting: Adult Health

## 2020-03-12 DIAGNOSIS — I220 Subsequent ST elevation (STEMI) myocardial infarction of anterior wall: Secondary | ICD-10-CM | POA: Diagnosis not present

## 2020-03-12 MED ORDER — OXYCODONE-ACETAMINOPHEN 5-325 MG PO TABS: 1.0000 | ORAL_TABLET | Freq: Three times a day (TID) | ORAL | 0 refills | Status: DC | PRN

## 2020-03-12 NOTE — Progress Notes (Signed)
PRN postop 

## 2020-03-12 NOTE — Telephone Encounter (Signed)
Scheduled appts per 3/23 los. Pt confirmed appt date and time.

## 2020-03-12 NOTE — Telephone Encounter (Signed)
Rx sent. - Dr. Landen Breeland

## 2020-03-13 DIAGNOSIS — I220 Subsequent ST elevation (STEMI) myocardial infarction of anterior wall: Secondary | ICD-10-CM | POA: Diagnosis not present

## 2020-03-14 ENCOUNTER — Institutional Professional Consult (permissible substitution): Payer: Medicaid Other | Admitting: Plastic Surgery

## 2020-03-14 DIAGNOSIS — I220 Subsequent ST elevation (STEMI) myocardial infarction of anterior wall: Secondary | ICD-10-CM | POA: Diagnosis not present

## 2020-03-15 DIAGNOSIS — I220 Subsequent ST elevation (STEMI) myocardial infarction of anterior wall: Secondary | ICD-10-CM | POA: Diagnosis not present

## 2020-03-16 DIAGNOSIS — I220 Subsequent ST elevation (STEMI) myocardial infarction of anterior wall: Secondary | ICD-10-CM | POA: Diagnosis not present

## 2020-03-17 ENCOUNTER — Ambulatory Visit: Payer: Medicaid Other | Admitting: Physical Therapy

## 2020-03-17 DIAGNOSIS — I220 Subsequent ST elevation (STEMI) myocardial infarction of anterior wall: Secondary | ICD-10-CM | POA: Diagnosis not present

## 2020-03-18 ENCOUNTER — Other Ambulatory Visit: Payer: Self-pay

## 2020-03-18 ENCOUNTER — Other Ambulatory Visit: Payer: Self-pay | Admitting: Family Medicine

## 2020-03-18 ENCOUNTER — Ambulatory Visit (HOSPITAL_COMMUNITY): Payer: Medicaid Other

## 2020-03-18 DIAGNOSIS — Z853 Personal history of malignant neoplasm of breast: Secondary | ICD-10-CM

## 2020-03-18 DIAGNOSIS — I10 Essential (primary) hypertension: Secondary | ICD-10-CM

## 2020-03-18 DIAGNOSIS — M792 Neuralgia and neuritis, unspecified: Secondary | ICD-10-CM

## 2020-03-18 DIAGNOSIS — I220 Subsequent ST elevation (STEMI) myocardial infarction of anterior wall: Secondary | ICD-10-CM | POA: Diagnosis not present

## 2020-03-18 DIAGNOSIS — G8929 Other chronic pain: Secondary | ICD-10-CM

## 2020-03-18 MED ORDER — ATORVASTATIN CALCIUM 40 MG PO TABS: 40.0000 mg | ORAL_TABLET | Freq: Every day | ORAL | 3 refills | Status: DC

## 2020-03-19 ENCOUNTER — Ambulatory Visit: Payer: Medicaid Other

## 2020-03-19 ENCOUNTER — Telehealth: Payer: Self-pay | Admitting: Podiatry

## 2020-03-19 ENCOUNTER — Other Ambulatory Visit: Payer: Self-pay | Admitting: *Deleted

## 2020-03-19 ENCOUNTER — Other Ambulatory Visit: Payer: Self-pay

## 2020-03-19 ENCOUNTER — Other Ambulatory Visit: Payer: Self-pay | Admitting: Podiatry

## 2020-03-19 DIAGNOSIS — R262 Difficulty in walking, not elsewhere classified: Secondary | ICD-10-CM

## 2020-03-19 DIAGNOSIS — M6281 Muscle weakness (generalized): Secondary | ICD-10-CM

## 2020-03-19 DIAGNOSIS — Z9889 Other specified postprocedural states: Secondary | ICD-10-CM

## 2020-03-19 DIAGNOSIS — I220 Subsequent ST elevation (STEMI) myocardial infarction of anterior wall: Secondary | ICD-10-CM | POA: Diagnosis not present

## 2020-03-19 DIAGNOSIS — M2042 Other hammer toe(s) (acquired), left foot: Secondary | ICD-10-CM

## 2020-03-19 DIAGNOSIS — M2012 Hallux valgus (acquired), left foot: Secondary | ICD-10-CM

## 2020-03-19 MED ORDER — OXYCODONE-ACETAMINOPHEN 5-325 MG PO TABS: 1.0000 | ORAL_TABLET | Freq: Three times a day (TID) | ORAL | 0 refills | Status: DC | PRN

## 2020-03-19 NOTE — Telephone Encounter (Signed)
Patient would like a refill on pain med.

## 2020-03-19 NOTE — Patient Instructions (Signed)
L toe flexion, extension, abd 10x, 3x a day for each motion. Gentle 1st-3rd ray flexion and extension 5x, 3x a day for a 10 sec stretch each.
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
## 2020-03-19 NOTE — Progress Notes (Signed)
PRN postop pain 

## 2020-03-19 NOTE — Telephone Encounter (Signed)
Pt asked if Dr. Amalia Hailey was going to refill the pain medication.

## 2020-03-19 NOTE — Telephone Encounter (Signed)
Patient would like a refill on pain medication °

## 2020-03-19 NOTE — Telephone Encounter (Signed)
I spoke with pt and she states the toe that is bending down is killing her and the bunion side is very sore.

## 2020-03-20 ENCOUNTER — Telehealth: Payer: Self-pay

## 2020-03-20 DIAGNOSIS — I220 Subsequent ST elevation (STEMI) myocardial infarction of anterior wall: Secondary | ICD-10-CM | POA: Diagnosis not present

## 2020-03-20 MED ORDER — VITAMIN D (ERGOCALCIFEROL) 1.25 MG (50000 UNIT) PO CAPS: 50000.0000 [IU] | ORAL_CAPSULE | ORAL | 0 refills | Status: DC

## 2020-03-20 NOTE — Telephone Encounter (Signed)
[name] with [facility] [address] called wanting to know if he can fill this patient's Oxycodone early.  You sent in a refill yesterday, but he said it was too early and wanted to get your ok to go ahead and fill it.  She will run out tomorrow evening.  Please advise

## 2020-03-21 DIAGNOSIS — I220 Subsequent ST elevation (STEMI) myocardial infarction of anterior wall: Secondary | ICD-10-CM | POA: Diagnosis not present

## 2020-03-22 DIAGNOSIS — I220 Subsequent ST elevation (STEMI) myocardial infarction of anterior wall: Secondary | ICD-10-CM | POA: Diagnosis not present

## 2020-03-23 DIAGNOSIS — I220 Subsequent ST elevation (STEMI) myocardial infarction of anterior wall: Secondary | ICD-10-CM | POA: Diagnosis not present

## 2020-03-24 ENCOUNTER — Other Ambulatory Visit: Payer: Self-pay

## 2020-03-24 DIAGNOSIS — I220 Subsequent ST elevation (STEMI) myocardial infarction of anterior wall: Secondary | ICD-10-CM | POA: Diagnosis not present

## 2020-03-24 MED ORDER — AMLODIPINE BESYLATE 5 MG PO TABS: 5.0000 mg | ORAL_TABLET | Freq: Every day | ORAL | 3 refills | Status: DC

## 2020-03-24 NOTE — Telephone Encounter (Signed)
Dr Amalia Hailey,  Please advise

## 2020-03-25 ENCOUNTER — Other Ambulatory Visit (HOSPITAL_COMMUNITY): Payer: Medicaid Other

## 2020-03-25 ENCOUNTER — Ambulatory Visit (HOSPITAL_COMMUNITY)
Admission: RE | Admit: 2020-03-25 | Discharge: 2020-03-25 | Disposition: A | Discharge status: Home / Self Care | Payer: Medicaid Other | Source: Ambulatory Visit | Attending: Adult Health | Admitting: Adult Health

## 2020-03-25 DIAGNOSIS — I220 Subsequent ST elevation (STEMI) myocardial infarction of anterior wall: Secondary | ICD-10-CM | POA: Diagnosis not present

## 2020-03-25 DIAGNOSIS — Z170 Estrogen receptor positive status [ER+]: Secondary | ICD-10-CM

## 2020-03-25 DIAGNOSIS — C50811 Malignant neoplasm of overlapping sites of right female breast: Secondary | ICD-10-CM

## 2020-03-26 ENCOUNTER — Ambulatory Visit (HOSPITAL_COMMUNITY): Payer: Medicaid Other

## 2020-03-26 ENCOUNTER — Ambulatory Visit: Payer: Medicaid Other

## 2020-03-26 DIAGNOSIS — I220 Subsequent ST elevation (STEMI) myocardial infarction of anterior wall: Secondary | ICD-10-CM | POA: Diagnosis not present

## 2020-03-27 ENCOUNTER — Ambulatory Visit (HOSPITAL_COMMUNITY): Payer: Medicaid Other

## 2020-03-27 ENCOUNTER — Ambulatory Visit (HOSPITAL_COMMUNITY): Admission: RE | Admit: 2020-03-27 | Payer: Medicaid Other | Source: Ambulatory Visit

## 2020-03-27 DIAGNOSIS — I220 Subsequent ST elevation (STEMI) myocardial infarction of anterior wall: Secondary | ICD-10-CM | POA: Diagnosis not present

## 2020-03-28 DIAGNOSIS — I220 Subsequent ST elevation (STEMI) myocardial infarction of anterior wall: Secondary | ICD-10-CM | POA: Diagnosis not present

## 2020-03-29 DIAGNOSIS — I220 Subsequent ST elevation (STEMI) myocardial infarction of anterior wall: Secondary | ICD-10-CM | POA: Diagnosis not present

## 2020-03-30 DIAGNOSIS — I220 Subsequent ST elevation (STEMI) myocardial infarction of anterior wall: Secondary | ICD-10-CM | POA: Diagnosis not present

## 2020-03-31 ENCOUNTER — Ambulatory Visit (HOSPITAL_COMMUNITY): Payer: Medicaid Other

## 2020-03-31 DIAGNOSIS — I220 Subsequent ST elevation (STEMI) myocardial infarction of anterior wall: Secondary | ICD-10-CM | POA: Diagnosis not present

## 2020-04-01 ENCOUNTER — Ambulatory Visit (INDEPENDENT_AMBULATORY_CARE_PROVIDER_SITE_OTHER): Payer: Medicaid Other | Admitting: Plastic Surgery

## 2020-04-01 ENCOUNTER — Ambulatory Visit (HOSPITAL_COMMUNITY)
Admission: RE | Admit: 2020-04-01 | Discharge: 2020-04-01 | Disposition: A | Discharge status: Home / Self Care | Payer: Medicaid Other | Source: Ambulatory Visit | Attending: Adult Health | Admitting: Adult Health

## 2020-04-01 ENCOUNTER — Encounter: Payer: Self-pay | Admitting: Licensed Clinical Social Worker

## 2020-04-01 ENCOUNTER — Encounter: Payer: Self-pay | Admitting: Plastic Surgery

## 2020-04-01 ENCOUNTER — Telehealth: Payer: Self-pay | Admitting: Podiatry

## 2020-04-01 ENCOUNTER — Other Ambulatory Visit: Payer: Self-pay

## 2020-04-01 ENCOUNTER — Telehealth: Payer: Self-pay | Admitting: Family Medicine

## 2020-04-01 ENCOUNTER — Telehealth: Payer: Self-pay

## 2020-04-01 VITALS — BP 95/54 | HR 81 | Temp 97.3°F | Ht 65.0 in | Wt 230.2 lb

## 2020-04-01 DIAGNOSIS — Z170 Estrogen receptor positive status [ER+]: Secondary | ICD-10-CM | POA: Insufficient documentation

## 2020-04-01 DIAGNOSIS — Z9013 Acquired absence of bilateral breasts and nipples: Secondary | ICD-10-CM

## 2020-04-01 DIAGNOSIS — C50811 Malignant neoplasm of overlapping sites of right female breast: Secondary | ICD-10-CM | POA: Diagnosis not present

## 2020-04-01 DIAGNOSIS — R221 Localized swelling, mass and lump, neck: Secondary | ICD-10-CM | POA: Diagnosis not present

## 2020-04-01 DIAGNOSIS — I890 Lymphedema, not elsewhere classified: Secondary | ICD-10-CM | POA: Diagnosis not present

## 2020-04-01 DIAGNOSIS — I220 Subsequent ST elevation (STEMI) myocardial infarction of anterior wall: Secondary | ICD-10-CM | POA: Diagnosis not present

## 2020-04-01 DIAGNOSIS — C50412 Malignant neoplasm of upper-outer quadrant of left female breast: Secondary | ICD-10-CM | POA: Diagnosis not present

## 2020-04-01 NOTE — Telephone Encounter (Signed)
Pt called for refill of the pain medication, states her pain in mostly in the 2nd toe and bunion but has more in the 3rd now, would like 10 days of the pain medication.

## 2020-04-01 NOTE — Telephone Encounter (Signed)
TC to pt per Wilber Bihari NP to let her know that the area in her neck are slightly enlarged lymph nodes. These appear benign, or not related to cancer. Patient verbalized understanding. No further problems or concerns at this time.

## 2020-04-01 NOTE — Telephone Encounter (Signed)
Patient wanted to know if she could get a referral to [facility] for her knee to get fluid off. Please give patient a call.
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
## 2020-04-01 NOTE — Progress Notes (Signed)
[place] CSW Progress Note  Holiday representative met with patient to give second installment of [employer]. Patient also had social security disability renewal paperwork that she wanted to make sure did not require doctor's signatures. The paperwork she had was for the individual applicant to complete. CSW informed her and clarified what she would need to complete.    Edwinna Areola Gertrude Bucks , LCSW

## 2020-04-01 NOTE — Telephone Encounter (Signed)
Patient calling to request refill of: atorvastatin (LIPITOR)  Name of Medication(s):   atorvastatin (LIPITOR)  Last date of OV: 02-22-2020 [facility].  Will route refill request to Clinic RN.  Discussed with patient policy to call pharmacy for future refills.  Also, discussed refills may take up to 48 hours to approve or deny.  Richrd Humbles

## 2020-04-01 NOTE — Telephone Encounter (Signed)
Patient is requesting a refill on pain meds, she would like to have it by tomorrow

## 2020-04-02 ENCOUNTER — Other Ambulatory Visit: Payer: Self-pay | Admitting: Family Medicine

## 2020-04-02 ENCOUNTER — Ambulatory Visit: Payer: Medicaid Other | Attending: Podiatry

## 2020-04-02 ENCOUNTER — Telehealth: Payer: Self-pay | Admitting: *Deleted

## 2020-04-02 ENCOUNTER — Telehealth: Payer: Self-pay | Admitting: Podiatry

## 2020-04-02 ENCOUNTER — Other Ambulatory Visit: Payer: Self-pay | Admitting: Podiatry

## 2020-04-02 DIAGNOSIS — M6281 Muscle weakness (generalized): Secondary | ICD-10-CM

## 2020-04-02 DIAGNOSIS — Z9889 Other specified postprocedural states: Secondary | ICD-10-CM | POA: Diagnosis present

## 2020-04-02 DIAGNOSIS — M2042 Other hammer toe(s) (acquired), left foot: Secondary | ICD-10-CM | POA: Diagnosis present

## 2020-04-02 DIAGNOSIS — R262 Difficulty in walking, not elsewhere classified: Secondary | ICD-10-CM | POA: Diagnosis present

## 2020-04-02 DIAGNOSIS — M2012 Hallux valgus (acquired), left foot: Secondary | ICD-10-CM

## 2020-04-02 DIAGNOSIS — I220 Subsequent ST elevation (STEMI) myocardial infarction of anterior wall: Secondary | ICD-10-CM | POA: Diagnosis not present

## 2020-04-02 DIAGNOSIS — M25462 Effusion, left knee: Secondary | ICD-10-CM

## 2020-04-02 MED ORDER — OXYCODONE-ACETAMINOPHEN 5-325 MG PO TABS: 1.0000 | ORAL_TABLET | Freq: Three times a day (TID) | ORAL | 0 refills | Status: DC | PRN

## 2020-04-02 MED ORDER — ATORVASTATIN CALCIUM 40 MG PO TABS: 40.0000 mg | ORAL_TABLET | Freq: Every day | ORAL | 3 refills | Status: DC

## 2020-04-02 NOTE — Telephone Encounter (Signed)
Atorvastatin was refilled, and referral was placed to orthopedics at patient request.

## 2020-04-02 NOTE — Telephone Encounter (Signed)
Rx sent. - Dr. Rylen Hou

## 2020-04-02 NOTE — Telephone Encounter (Signed)
Pt says Dr. Amalia Hailey never ordered her prescription. Please advise.

## 2020-04-02 NOTE — Telephone Encounter (Signed)
Pt informed of below.[name], CMA ? ?

## 2020-04-02 NOTE — Telephone Encounter (Addendum)
Pt presents to the office for a refill of the percocet and a dressing. I gave pt 2 ace wraps, 2 bottle waters and 1 ice pack and Dr. Amalia Hailey refilled percocet.

## 2020-04-02 NOTE — Progress Notes (Signed)
PRN postop pain 

## 2020-04-03 ENCOUNTER — Ambulatory Visit (HOSPITAL_COMMUNITY): Admission: RE | Admit: 2020-04-03 | Payer: Medicaid Other | Source: Ambulatory Visit

## 2020-04-03 DIAGNOSIS — I220 Subsequent ST elevation (STEMI) myocardial infarction of anterior wall: Secondary | ICD-10-CM | POA: Diagnosis not present

## 2020-04-03 NOTE — Patient Instructions (Addendum)
Individual 1-3 rays and all toe stretches for flexion and extension. Use of yellow Tband for flex and standing forefore/toe ext stretches. Try marble pick up c toes.
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
## 2020-04-04 DIAGNOSIS — I220 Subsequent ST elevation (STEMI) myocardial infarction of anterior wall: Secondary | ICD-10-CM | POA: Diagnosis not present

## 2020-04-05 DIAGNOSIS — I220 Subsequent ST elevation (STEMI) myocardial infarction of anterior wall: Secondary | ICD-10-CM | POA: Diagnosis not present

## 2020-04-06 DIAGNOSIS — I220 Subsequent ST elevation (STEMI) myocardial infarction of anterior wall: Secondary | ICD-10-CM | POA: Diagnosis not present

## 2020-04-07 ENCOUNTER — Encounter: Payer: Medicaid Other | Admitting: Podiatry

## 2020-04-07 DIAGNOSIS — I220 Subsequent ST elevation (STEMI) myocardial infarction of anterior wall: Secondary | ICD-10-CM | POA: Diagnosis not present

## 2020-04-08 ENCOUNTER — Telehealth: Payer: Self-pay | Admitting: *Deleted

## 2020-04-08 DIAGNOSIS — I220 Subsequent ST elevation (STEMI) myocardial infarction of anterior wall: Secondary | ICD-10-CM | POA: Diagnosis not present

## 2020-04-08 NOTE — Telephone Encounter (Signed)
Pt called states because she over slept, rescheduled to 04/23/2020, and still has pain in the 2, 3 toes, but the swelling has gone down.

## 2020-04-09 ENCOUNTER — Other Ambulatory Visit: Payer: Self-pay

## 2020-04-09 ENCOUNTER — Telehealth: Payer: Self-pay | Admitting: *Deleted

## 2020-04-09 ENCOUNTER — Ambulatory Visit: Payer: Medicaid Other

## 2020-04-09 DIAGNOSIS — M6281 Muscle weakness (generalized): Secondary | ICD-10-CM

## 2020-04-09 DIAGNOSIS — M2042 Other hammer toe(s) (acquired), left foot: Secondary | ICD-10-CM

## 2020-04-09 DIAGNOSIS — M2012 Hallux valgus (acquired), left foot: Secondary | ICD-10-CM

## 2020-04-09 DIAGNOSIS — I220 Subsequent ST elevation (STEMI) myocardial infarction of anterior wall: Secondary | ICD-10-CM | POA: Diagnosis not present

## 2020-04-09 DIAGNOSIS — Z9889 Other specified postprocedural states: Secondary | ICD-10-CM

## 2020-04-09 DIAGNOSIS — R262 Difficulty in walking, not elsewhere classified: Secondary | ICD-10-CM
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
## 2020-04-09 NOTE — Telephone Encounter (Signed)
Pt called twice requesting pain medication refill for Thursday, stating she has excruciating pain in the 1st and 2nd toes and she also has PT starting Friday.

## 2020-04-10 ENCOUNTER — Telehealth: Payer: Self-pay | Admitting: Podiatry

## 2020-04-10 ENCOUNTER — Other Ambulatory Visit: Payer: Self-pay | Admitting: Podiatry

## 2020-04-10 ENCOUNTER — Ambulatory Visit (HOSPITAL_COMMUNITY): Payer: Medicaid Other

## 2020-04-10 DIAGNOSIS — I220 Subsequent ST elevation (STEMI) myocardial infarction of anterior wall: Secondary | ICD-10-CM | POA: Diagnosis not present

## 2020-04-10 MED ORDER — OXYCODONE-ACETAMINOPHEN 5-325 MG PO TABS: 1.0000 | ORAL_TABLET | Freq: Three times a day (TID) | ORAL | 0 refills | Status: DC | PRN

## 2020-04-10 NOTE — Telephone Encounter (Signed)
Rx sent. - Dr. Daenerys Buttram

## 2020-04-10 NOTE — Progress Notes (Signed)
PRN postop 

## 2020-04-10 NOTE — Telephone Encounter (Signed)
Patient is calling again to have her oxycodone refilled, she said shes in pain.

## 2020-04-11 ENCOUNTER — Other Ambulatory Visit: Payer: Self-pay | Admitting: *Deleted

## 2020-04-11 DIAGNOSIS — I220 Subsequent ST elevation (STEMI) myocardial infarction of anterior wall: Secondary | ICD-10-CM | POA: Diagnosis not present

## 2020-04-12 DIAGNOSIS — I220 Subsequent ST elevation (STEMI) myocardial infarction of anterior wall: Secondary | ICD-10-CM | POA: Diagnosis not present

## 2020-04-13 DIAGNOSIS — I220 Subsequent ST elevation (STEMI) myocardial infarction of anterior wall: Secondary | ICD-10-CM | POA: Diagnosis not present

## 2020-04-13 NOTE — Progress Notes (Signed)
This encounter was created in error - please disregard.

## 2020-04-14 ENCOUNTER — Other Ambulatory Visit: Payer: Self-pay | Admitting: Family Medicine

## 2020-04-14 DIAGNOSIS — I220 Subsequent ST elevation (STEMI) myocardial infarction of anterior wall: Secondary | ICD-10-CM | POA: Diagnosis not present

## 2020-04-14 MED ORDER — METOPROLOL SUCCINATE ER 50 MG PO TB24: 100.0000 mg | ORAL_TABLET | Freq: Every day | ORAL | 2 refills | Status: DC

## 2020-04-15 DIAGNOSIS — I220 Subsequent ST elevation (STEMI) myocardial infarction of anterior wall: Secondary | ICD-10-CM | POA: Diagnosis not present

## 2020-04-16 ENCOUNTER — Other Ambulatory Visit: Payer: Self-pay | Admitting: Family Medicine

## 2020-04-16 ENCOUNTER — Other Ambulatory Visit: Payer: Self-pay

## 2020-04-16 ENCOUNTER — Telehealth: Payer: Self-pay | Admitting: *Deleted

## 2020-04-16 ENCOUNTER — Encounter: Payer: Self-pay | Admitting: Family Medicine

## 2020-04-16 ENCOUNTER — Other Ambulatory Visit: Payer: Self-pay | Admitting: Podiatry

## 2020-04-16 ENCOUNTER — Ambulatory Visit: Payer: Medicaid Other

## 2020-04-16 ENCOUNTER — Ambulatory Visit (INDEPENDENT_AMBULATORY_CARE_PROVIDER_SITE_OTHER): Payer: Medicaid Other | Admitting: Family Medicine

## 2020-04-16 ENCOUNTER — Telehealth: Payer: Self-pay | Admitting: Podiatry

## 2020-04-16 VITALS — BP 100/66 | HR 66 | Ht 65.0 in | Wt 231.2 lb

## 2020-04-16 DIAGNOSIS — M2012 Hallux valgus (acquired), left foot: Secondary | ICD-10-CM | POA: Diagnosis not present

## 2020-04-16 DIAGNOSIS — Z1211 Encounter for screening for malignant neoplasm of colon: Secondary | ICD-10-CM

## 2020-04-16 DIAGNOSIS — R262 Difficulty in walking, not elsewhere classified: Secondary | ICD-10-CM

## 2020-04-16 DIAGNOSIS — Z720 Tobacco use: Secondary | ICD-10-CM

## 2020-04-16 DIAGNOSIS — M6281 Muscle weakness (generalized): Secondary | ICD-10-CM

## 2020-04-16 DIAGNOSIS — E876 Hypokalemia: Secondary | ICD-10-CM

## 2020-04-16 DIAGNOSIS — M1711 Unilateral primary osteoarthritis, right knee: Secondary | ICD-10-CM

## 2020-04-16 DIAGNOSIS — M2042 Other hammer toe(s) (acquired), left foot: Secondary | ICD-10-CM

## 2020-04-16 DIAGNOSIS — I220 Subsequent ST elevation (STEMI) myocardial infarction of anterior wall: Secondary | ICD-10-CM | POA: Diagnosis not present

## 2020-04-16 DIAGNOSIS — Z9889 Other specified postprocedural states: Secondary | ICD-10-CM

## 2020-04-16 MED ORDER — OXYCODONE-ACETAMINOPHEN 5-325 MG PO TABS: 1.0000 | ORAL_TABLET | Freq: Three times a day (TID) | ORAL | 0 refills | Status: DC | PRN

## 2020-04-16 NOTE — Telephone Encounter (Signed)
Pt called states she is still having excruciating pain in the side of the big toe and the 2nd toe and would like her pain medication sent to the pharmacy today or tomorrow.

## 2020-04-16 NOTE — Telephone Encounter (Signed)
Rx sent on 03/24/2020. Because of the note that this has been cancelled, I wanted you to be aware. Ottis Stain, CMA

## 2020-04-16 NOTE — Telephone Encounter (Signed)
Patient said that she is having severe pain on the side of foot and 2nd toe pain. She said she needs to have her Oxycodone called in.

## 2020-04-16 NOTE — Progress Notes (Signed)
    SUBJECTIVE:   CHIEF COMPLAINT / HPI:   R knee pain Patient reports chronic knee pain that has been gradually progressing.  She has been diagnosed with osteoarthritis in the past.  She finds that it is difficult for her to fully extend or flex her knee without pain.  She denies any acute swelling.  She also denies recent injury.  She takes meloxicam and hydrocodone infrequently for pain relief.  Has seen [facility] in the past but was dismissed from their office, so she would like a new referral today.  She is ready to have her knee replacement surgery and wants to get it done this year.  Hypotension with hypokalemia Patient reports that she is taking amlodipine, metoprolol, and HCTZ for blood pressure control.  Although I had stopped her HCTZ due to her hypokalemia, it was restarted by a hospitalist last fall.  Patient says that she has felt a bit lightheaded at times.  K was 3.1 in March.  She continues to take Klor-Con 20 mg 3 times daily.  Tobacco use Continues to smoke about 6 cigarettes per day.  She says that if she is told to stop smoking before so that she can get her knee surgery, she will stop at that time.  She used to smoke 1 pack/day.  She is aware of our smoking cessation clinic.  PERTINENT  PMH / PSH: Osteoarthritis of the knee, chronic hypokalemia, tobacco use disorder  OBJECTIVE:   BP 100/66   Pulse 66   Ht 5\' 5"  (1.651 m)   Wt 231 lb 3.2 oz (104.9 kg)   LMP 08/07/2011   SpO2 98%   BMI 38.47 kg/m   General: well appearing, appears stated age Cardiac: RRR, no MRG Knee, right: Inspection was negative for erythema, negative for effusion, and negative for obvious bony abnormalities. Palpation was negative for obvious Baker's cyst development, negative for asymmetric warmth, positive for joint line tenderness, positive for condyle tenderness, positive for patellar tenderness, positive for patellar crepitus, and negative for tenderness of the pes anserine bursa.  Patellar and quadriceps tendons unremarkable. ROM limited in flexion (100 degrees) and extension (15 degrees). Normal hamstring and quadriceps strength. Neurovascularly intact bilaterally.   ASSESSMENT/PLAN:   Osteoarthritis of knee Patient has already been referred to [facility] in mid April.  I gave her their number and told her that if she has not heard from them, she can call their office or give [name] a call to follow-up.  I counseled her that she needs to keep her BMI less than 40 in order to get this operation, and that smoking cessation will help her to heal faster from her knee replacement.  Hypokalemia Will stop HCTZ due to patient's dizziness along with low blood pressure today of 100/66.  This would also contribute to patient's hypokalemia.  Will check a BMP today.  Tobacco abuse Counseled again on the importance of tobacco cessation for her overall health as well as for recovery from a future TKA.  Patient is in the contemplative stage.     [name], MD [place]

## 2020-04-16 NOTE — Telephone Encounter (Signed)
Just sent in a refill... patient is now past her 3 months postop. No more pain medications. Transition over to OTC Motrin or Aleve.  - Dr. Amalia Hailey
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
## 2020-04-16 NOTE — Telephone Encounter (Signed)
I informed pt of Dr. Amalia Hailey orders and pt states understanding.

## 2020-04-16 NOTE — Patient Instructions (Signed)
It was nice seeing you today [name]!  You have been referred to the orthopedic group called [facility].  Their number is [phone].  You do not receive a call from them in the next few weeks, please let us know.  Please make sure to not gain much weight since they will not do the operation if your BMI is greater than 40.  I am placing a referral for your colonoscopy.  This is an important screening test that can detect early colon cancer.  Please call [facility] or [facility] to make an appointment when you are ready.  Please continue to work on reducing the amount of cigarettes you are smoking.  We have a tobacco cessation clinic in this building that can help you if you would like.  If you quit smoking, you will likely heal much faster from your knee replacement surgery.  We are checking your kidney function and electrolytes today.  We stopped the HCTZ since this may be reducing your blood pressure too much and causing your potassium to be lower than it should be.  I will let you know what these results are once they return.  If you have any questions or concerns, please feel free to call the clinic.   Be well,  Dr. Shan Levans

## 2020-04-17 ENCOUNTER — Ambulatory Visit (HOSPITAL_COMMUNITY): Payer: Medicaid Other

## 2020-04-17 DIAGNOSIS — I220 Subsequent ST elevation (STEMI) myocardial infarction of anterior wall: Secondary | ICD-10-CM | POA: Diagnosis not present

## 2020-04-17 LAB — BASIC METABOLIC PANEL
BUN/Creatinine Ratio: 11 (ref 9–23)
BUN: 10 mg/dL (ref 6–24)
CO2: 24 mmol/L (ref 20–29)
Calcium: 9.7 mg/dL (ref 8.7–10.2)
Chloride: 103 mmol/L (ref 96–106)
Creatinine, Ser: 0.95 mg/dL (ref 0.57–1.00)
GFR calc Af Amer: 77 mL/min/{1.73_m2} (ref >59)
GFR calc non Af Amer: 67 mL/min/{1.73_m2} (ref >59)
Glucose: 94 mg/dL (ref 65–99)
Potassium: 3.7 mmol/L (ref 3.5–5.2)
Sodium: 143 mmol/L (ref 134–144)

## 2020-04-17 NOTE — Assessment & Plan Note (Signed)
Counseled again on the importance of tobacco cessation for her overall health as well as for recovery from a future TKA.  Patient is in the contemplative stage.

## 2020-04-17 NOTE — Progress Notes (Signed)
Would you call [name] and let her know that her electrolytes and kidney function are normal?  Thank you!

## 2020-04-17 NOTE — Assessment & Plan Note (Signed)
Patient has already been referred to [facility] in mid April.  I gave her their number and told her that if she has not heard from them, she can call their office or give [name] a call to follow-up.  I counseled her that she needs to keep her BMI less than 40 in order to get this operation, and that smoking cessation will help her to heal faster from her knee replacement.

## 2020-04-17 NOTE — Assessment & Plan Note (Signed)
Will stop HCTZ due to patient's dizziness along with low blood pressure today of 100/66.  This would also contribute to patient's hypokalemia.  Will check a BMP today.

## 2020-04-18 ENCOUNTER — Telehealth: Payer: Self-pay | Admitting: *Deleted

## 2020-04-18 ENCOUNTER — Other Ambulatory Visit: Payer: Self-pay | Admitting: Oncology

## 2020-04-18 ENCOUNTER — Other Ambulatory Visit: Payer: Self-pay | Admitting: Family Medicine

## 2020-04-18 DIAGNOSIS — G8929 Other chronic pain: Secondary | ICD-10-CM

## 2020-04-18 DIAGNOSIS — I220 Subsequent ST elevation (STEMI) myocardial infarction of anterior wall: Secondary | ICD-10-CM | POA: Diagnosis not present

## 2020-04-18 DIAGNOSIS — Z853 Personal history of malignant neoplasm of breast: Secondary | ICD-10-CM

## 2020-04-18 DIAGNOSIS — M792 Neuralgia and neuritis, unspecified: Secondary | ICD-10-CM

## 2020-04-18 DIAGNOSIS — I10 Essential (primary) hypertension: Secondary | ICD-10-CM

## 2020-04-18 MED ORDER — METHADONE HCL 5 MG PO TABS: ORAL_TABLET | ORAL | 0 refills | Status: DC

## 2020-04-18 NOTE — Telephone Encounter (Signed)
-----   Message from [name], MD sent at 04/17/2020  1:50 PM EDT ----- Would you call Ms. Kollin and let her know that her electrolytes and kidney function are normal?  Thank you!

## 2020-04-18 NOTE — Telephone Encounter (Signed)
LVM to call office to inform her of below. [name], CMA  

## 2020-04-19 DIAGNOSIS — I220 Subsequent ST elevation (STEMI) myocardial infarction of anterior wall: Secondary | ICD-10-CM | POA: Diagnosis not present

## 2020-04-20 ENCOUNTER — Other Ambulatory Visit: Payer: Self-pay | Admitting: Family Medicine

## 2020-04-20 DIAGNOSIS — I220 Subsequent ST elevation (STEMI) myocardial infarction of anterior wall: Secondary | ICD-10-CM | POA: Diagnosis not present

## 2020-04-21 DIAGNOSIS — I220 Subsequent ST elevation (STEMI) myocardial infarction of anterior wall: Secondary | ICD-10-CM | POA: Diagnosis not present

## 2020-04-22 DIAGNOSIS — I220 Subsequent ST elevation (STEMI) myocardial infarction of anterior wall: Secondary | ICD-10-CM | POA: Diagnosis not present

## 2020-04-23 ENCOUNTER — Ambulatory Visit: Payer: Medicaid Other

## 2020-04-23 ENCOUNTER — Other Ambulatory Visit: Payer: Self-pay

## 2020-04-23 ENCOUNTER — Ambulatory Visit: Payer: Medicaid Other | Admitting: Podiatry

## 2020-04-23 DIAGNOSIS — M2042 Other hammer toe(s) (acquired), left foot: Secondary | ICD-10-CM

## 2020-04-23 DIAGNOSIS — I220 Subsequent ST elevation (STEMI) myocardial infarction of anterior wall: Secondary | ICD-10-CM | POA: Diagnosis not present

## 2020-04-23 DIAGNOSIS — Z9889 Other specified postprocedural states: Secondary | ICD-10-CM

## 2020-04-24 ENCOUNTER — Telehealth: Payer: Self-pay | Admitting: *Deleted

## 2020-04-24 ENCOUNTER — Encounter (HOSPITAL_COMMUNITY): Payer: Self-pay

## 2020-04-24 ENCOUNTER — Ambulatory Visit (HOSPITAL_COMMUNITY)
Admission: RE | Admit: 2020-04-24 | Discharge: 2020-04-24 | Disposition: A | Discharge status: Home / Self Care | Payer: Medicaid Other | Source: Ambulatory Visit | Attending: Adult Health | Admitting: Adult Health

## 2020-04-24 DIAGNOSIS — C50811 Malignant neoplasm of overlapping sites of right female breast: Secondary | ICD-10-CM | POA: Diagnosis not present

## 2020-04-24 DIAGNOSIS — R1900 Intra-abdominal and pelvic swelling, mass and lump, unspecified site: Secondary | ICD-10-CM | POA: Diagnosis not present

## 2020-04-24 DIAGNOSIS — Z170 Estrogen receptor positive status [ER+]: Secondary | ICD-10-CM | POA: Diagnosis not present

## 2020-04-24 DIAGNOSIS — D219 Benign neoplasm of connective and other soft tissue, unspecified: Secondary | ICD-10-CM | POA: Insufficient documentation

## 2020-04-24 DIAGNOSIS — I220 Subsequent ST elevation (STEMI) myocardial infarction of anterior wall: Secondary | ICD-10-CM | POA: Diagnosis not present

## 2020-04-24 DIAGNOSIS — R918 Other nonspecific abnormal finding of lung field: Secondary | ICD-10-CM | POA: Diagnosis not present

## 2020-04-24 DIAGNOSIS — K5730 Diverticulosis of large intestine without perforation or abscess without bleeding: Secondary | ICD-10-CM | POA: Diagnosis not present

## 2020-04-24 MED ORDER — SODIUM CHLORIDE (PF) 0.9 % IJ SOLN
INTRAMUSCULAR | Status: AC
  Filled 2020-04-24: qty 50

## 2020-04-24 MED ORDER — IOHEXOL 300 MG/ML  SOLN
100.0000 mL | Freq: Once | INTRAMUSCULAR | Status: AC | PRN
  Administered 2020-04-24: 09:00:00 100 mL via INTRAVENOUS

## 2020-04-24 NOTE — Telephone Encounter (Signed)
Notified of message below. Verbalized understanding 

## 2020-04-24 NOTE — Telephone Encounter (Signed)
-----   Message from Gardenia Phlegm, NP sent at 04/24/2020  9:36 AM EDT ----- I do recommend that she see GYN about her uterus and the fibroids.   ----- Message ----- From: Interface, Rad Results In Sent: 04/24/2020   9:16 AM EDT To: Gardenia Phlegm, NP

## 2020-04-24 NOTE — Telephone Encounter (Signed)
-----   Message from Gardenia Phlegm, NP sent at 04/24/2020  9:35 AM EDT ----- Please call patient with results.  No cancer anywhere else.  She still has plaque in her arteries, so she needs to continue with healthy diet/exercise, and stay on her cholesterol medication.  Thanks, [place] ----- Message ----- From: Interface, Rad Results In Sent: 04/24/2020   9:16 AM EDT To: Gardenia Phlegm, NP

## 2020-04-25 DIAGNOSIS — I220 Subsequent ST elevation (STEMI) myocardial infarction of anterior wall: Secondary | ICD-10-CM | POA: Diagnosis not present

## 2020-04-26 DIAGNOSIS — I220 Subsequent ST elevation (STEMI) myocardial infarction of anterior wall: Secondary | ICD-10-CM | POA: Diagnosis not present

## 2020-04-27 DIAGNOSIS — I220 Subsequent ST elevation (STEMI) myocardial infarction of anterior wall: Secondary | ICD-10-CM | POA: Diagnosis not present

## 2020-04-28 DIAGNOSIS — I220 Subsequent ST elevation (STEMI) myocardial infarction of anterior wall: Secondary | ICD-10-CM | POA: Diagnosis not present

## 2020-04-29 DIAGNOSIS — I220 Subsequent ST elevation (STEMI) myocardial infarction of anterior wall: Secondary | ICD-10-CM | POA: Diagnosis not present
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
## 2020-04-30 ENCOUNTER — Ambulatory Visit: Payer: Medicaid Other

## 2020-04-30 DIAGNOSIS — I220 Subsequent ST elevation (STEMI) myocardial infarction of anterior wall: Secondary | ICD-10-CM | POA: Diagnosis not present

## 2020-05-01 DIAGNOSIS — I220 Subsequent ST elevation (STEMI) myocardial infarction of anterior wall: Secondary | ICD-10-CM | POA: Diagnosis not present

## 2020-05-02 DIAGNOSIS — I220 Subsequent ST elevation (STEMI) myocardial infarction of anterior wall: Secondary | ICD-10-CM | POA: Diagnosis not present

## 2020-05-03 DIAGNOSIS — I220 Subsequent ST elevation (STEMI) myocardial infarction of anterior wall: Secondary | ICD-10-CM | POA: Diagnosis not present

## 2020-05-04 DIAGNOSIS — I220 Subsequent ST elevation (STEMI) myocardial infarction of anterior wall: Secondary | ICD-10-CM | POA: Diagnosis not present

## 2020-05-05 ENCOUNTER — Ambulatory Visit: Payer: Medicaid Other | Admitting: Orthopedic Surgery

## 2020-05-05 DIAGNOSIS — I220 Subsequent ST elevation (STEMI) myocardial infarction of anterior wall: Secondary | ICD-10-CM | POA: Diagnosis not present

## 2020-05-06 ENCOUNTER — Ambulatory Visit: Payer: Medicaid Other | Admitting: Orthopedic Surgery

## 2020-05-06 DIAGNOSIS — I220 Subsequent ST elevation (STEMI) myocardial infarction of anterior wall: Secondary | ICD-10-CM | POA: Diagnosis not present

## 2020-05-07 ENCOUNTER — Other Ambulatory Visit: Payer: Self-pay

## 2020-05-07 ENCOUNTER — Ambulatory Visit: Payer: Medicaid Other | Attending: Podiatry

## 2020-05-07 DIAGNOSIS — R262 Difficulty in walking, not elsewhere classified: Secondary | ICD-10-CM

## 2020-05-07 DIAGNOSIS — M6281 Muscle weakness (generalized): Secondary | ICD-10-CM | POA: Insufficient documentation

## 2020-05-07 DIAGNOSIS — I220 Subsequent ST elevation (STEMI) myocardial infarction of anterior wall: Secondary | ICD-10-CM | POA: Diagnosis not present

## 2020-05-07 DIAGNOSIS — M2042 Other hammer toe(s) (acquired), left foot: Secondary | ICD-10-CM

## 2020-05-07 DIAGNOSIS — M2012 Hallux valgus (acquired), left foot: Secondary | ICD-10-CM | POA: Insufficient documentation

## 2020-05-07 DIAGNOSIS — Z9889 Other specified postprocedural states: Secondary | ICD-10-CM | POA: Insufficient documentation
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
## 2020-05-08 DIAGNOSIS — I220 Subsequent ST elevation (STEMI) myocardial infarction of anterior wall: Secondary | ICD-10-CM | POA: Diagnosis not present

## 2020-05-09 DIAGNOSIS — I220 Subsequent ST elevation (STEMI) myocardial infarction of anterior wall: Secondary | ICD-10-CM | POA: Diagnosis not present

## 2020-05-10 DIAGNOSIS — I220 Subsequent ST elevation (STEMI) myocardial infarction of anterior wall: Secondary | ICD-10-CM | POA: Diagnosis not present

## 2020-05-11 DIAGNOSIS — I220 Subsequent ST elevation (STEMI) myocardial infarction of anterior wall: Secondary | ICD-10-CM | POA: Diagnosis not present

## 2020-05-12 ENCOUNTER — Ambulatory Visit (INDEPENDENT_AMBULATORY_CARE_PROVIDER_SITE_OTHER): Payer: Medicaid Other

## 2020-05-12 ENCOUNTER — Encounter: Payer: Self-pay | Admitting: Orthopedic Surgery

## 2020-05-12 ENCOUNTER — Other Ambulatory Visit: Payer: Self-pay

## 2020-05-12 ENCOUNTER — Ambulatory Visit (INDEPENDENT_AMBULATORY_CARE_PROVIDER_SITE_OTHER): Payer: Medicaid Other | Admitting: Orthopedic Surgery

## 2020-05-12 VITALS — Ht 65.0 in | Wt 221.0 lb

## 2020-05-12 DIAGNOSIS — G8929 Other chronic pain: Secondary | ICD-10-CM

## 2020-05-12 DIAGNOSIS — M1711 Unilateral primary osteoarthritis, right knee: Secondary | ICD-10-CM | POA: Diagnosis not present

## 2020-05-12 DIAGNOSIS — I220 Subsequent ST elevation (STEMI) myocardial infarction of anterior wall: Secondary | ICD-10-CM | POA: Diagnosis not present

## 2020-05-12 DIAGNOSIS — M25561 Pain in right knee: Secondary | ICD-10-CM
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
## 2020-05-13 DIAGNOSIS — I220 Subsequent ST elevation (STEMI) myocardial infarction of anterior wall: Secondary | ICD-10-CM | POA: Diagnosis not present

## 2020-05-14 DIAGNOSIS — I220 Subsequent ST elevation (STEMI) myocardial infarction of anterior wall: Secondary | ICD-10-CM | POA: Diagnosis not present

## 2020-05-15 DIAGNOSIS — I220 Subsequent ST elevation (STEMI) myocardial infarction of anterior wall: Secondary | ICD-10-CM | POA: Diagnosis not present

## 2020-05-16 ENCOUNTER — Other Ambulatory Visit: Payer: Self-pay | Admitting: Adult Health

## 2020-05-16 ENCOUNTER — Other Ambulatory Visit: Payer: Self-pay | Admitting: Family Medicine

## 2020-05-16 DIAGNOSIS — G8929 Other chronic pain: Secondary | ICD-10-CM

## 2020-05-16 DIAGNOSIS — Z853 Personal history of malignant neoplasm of breast: Secondary | ICD-10-CM

## 2020-05-16 DIAGNOSIS — I10 Essential (primary) hypertension: Secondary | ICD-10-CM

## 2020-05-16 DIAGNOSIS — M792 Neuralgia and neuritis, unspecified: Secondary | ICD-10-CM

## 2020-05-16 DIAGNOSIS — I220 Subsequent ST elevation (STEMI) myocardial infarction of anterior wall: Secondary | ICD-10-CM | POA: Diagnosis not present

## 2020-05-16 MED ORDER — METHADONE HCL 5 MG PO TABS: ORAL_TABLET | ORAL | 0 refills | Status: DC

## 2020-05-16 NOTE — Progress Notes (Signed)
Refill appropriate per Dr. Virgie Dad pain management plan with the patient.  Methadone #270 sent to pharmacy.  Wilber Bihari, NP

## 2020-05-17 DIAGNOSIS — I220 Subsequent ST elevation (STEMI) myocardial infarction of anterior wall: Secondary | ICD-10-CM | POA: Diagnosis not present

## 2020-05-18 DIAGNOSIS — I220 Subsequent ST elevation (STEMI) myocardial infarction of anterior wall: Secondary | ICD-10-CM | POA: Diagnosis not present

## 2020-05-19 DIAGNOSIS — I220 Subsequent ST elevation (STEMI) myocardial infarction of anterior wall: Secondary | ICD-10-CM | POA: Diagnosis not present

## 2020-05-20 DIAGNOSIS — I220 Subsequent ST elevation (STEMI) myocardial infarction of anterior wall: Secondary | ICD-10-CM | POA: Diagnosis not present

## 2020-05-21 DIAGNOSIS — I220 Subsequent ST elevation (STEMI) myocardial infarction of anterior wall: Secondary | ICD-10-CM | POA: Diagnosis not present

## 2020-05-22 ENCOUNTER — Ambulatory Visit: Payer: Medicaid Other

## 2020-05-22 DIAGNOSIS — I220 Subsequent ST elevation (STEMI) myocardial infarction of anterior wall: Secondary | ICD-10-CM | POA: Diagnosis not present

## 2020-05-23 DIAGNOSIS — I220 Subsequent ST elevation (STEMI) myocardial infarction of anterior wall: Secondary | ICD-10-CM | POA: Diagnosis not present

## 2020-05-24 DIAGNOSIS — I220 Subsequent ST elevation (STEMI) myocardial infarction of anterior wall: Secondary | ICD-10-CM | POA: Diagnosis not present

## 2020-05-25 DIAGNOSIS — I220 Subsequent ST elevation (STEMI) myocardial infarction of anterior wall: Secondary | ICD-10-CM | POA: Diagnosis not present

## 2020-05-26 ENCOUNTER — Ambulatory Visit: Payer: Medicaid Other | Admitting: Obstetrics & Gynecology

## 2020-05-26 DIAGNOSIS — I220 Subsequent ST elevation (STEMI) myocardial infarction of anterior wall: Secondary | ICD-10-CM | POA: Diagnosis not present

## 2020-05-27 ENCOUNTER — Telehealth: Payer: Self-pay | Admitting: Orthopedic Surgery

## 2020-05-27 DIAGNOSIS — I220 Subsequent ST elevation (STEMI) myocardial infarction of anterior wall: Secondary | ICD-10-CM | POA: Diagnosis not present

## 2020-05-27 NOTE — Telephone Encounter (Signed)
[name], please advise. Patient is wanting to proceed with surgery and asking for a callback. (total knee arthroplasty)Thank you

## 2020-05-27 NOTE — Telephone Encounter (Signed)
Pt called stating she thought she was supposed to get set up for surgery but hasn't received a call.   [phone]

## 2020-05-28 ENCOUNTER — Telehealth: Payer: Self-pay | Admitting: Orthopedic Surgery

## 2020-05-28 DIAGNOSIS — I220 Subsequent ST elevation (STEMI) myocardial infarction of anterior wall: Secondary | ICD-10-CM | POA: Diagnosis not present

## 2020-05-28 NOTE — Telephone Encounter (Signed)
Patient called in regards to getting her surgery scheduled with Dr. Sharol Given.  She stated that she has left other messages, but has not had a returned call.  CB#[phone].  Thank you.

## 2020-05-29 DIAGNOSIS — I220 Subsequent ST elevation (STEMI) myocardial infarction of anterior wall: Secondary | ICD-10-CM | POA: Diagnosis not present

## 2020-05-30 ENCOUNTER — Telehealth: Payer: Self-pay | Admitting: Interventional Cardiology

## 2020-05-30 DIAGNOSIS — I220 Subsequent ST elevation (STEMI) myocardial infarction of anterior wall: Secondary | ICD-10-CM | POA: Diagnosis not present

## 2020-05-30 NOTE — Telephone Encounter (Signed)
New Message       [facility] Pre-operative Risk Assessment    HEARTCARE STAFF: - Please ensure there is not already an duplicate clearance open for this procedure. - Under Visit Info/Reason for Call, type in Other and utilize the format Clearance MM/DD/YY or Clearance TBD. Do not use dashes or single digits. - If request is for dental extraction, please clarify the # of teeth to be extracted.  Request for surgical clearance:  1. What type of surgery is being performed?  Right total knee   2. When is this surgery scheduled? 06/23   3. What type of clearance is required (medical clearance vs. Pharmacy clearance to hold med vs. Both)? Medical   4. Are there any medications that need to be held prior to surgery and how long? Not known   5. Practice name and name of physician performing surgery? [facility] Meridee Score  6. What is the office phone number? [phone]   7.   What is the office fax number? [phone]  8.   Anesthesia type (None, local, MAC, general) ? Choice   Debbie faxed Clearance request info on 06/10    [name] 05/30/2020, 9:48 AM  _________________________________________________________________   (provider comments below)

## 2020-05-30 NOTE — Telephone Encounter (Signed)
Primary Cardiologist:Henry Nicholes Stairs III, MD  Chart reviewed as part of pre-operative protocol coverage. Because of RACHAEL FERRIE past medical history and time since last visit, he/she will require a follow-up visit in order to better assess preoperative cardiovascular risk.  Pre-op covering staff: - Please schedule appointment and call patient to inform them. - Please contact requesting surgeon's office via preferred method (i.e, phone, fax) to inform them of need for appointment prior to surgery.  If applicable, this message will also be routed to pharmacy pool and/or primary cardiologist for input on holding anticoagulant/antiplatelet agent as requested below so that this information is available at time of patient's appointment.   Deberah Pelton, NP  05/30/2020, 9:57 AM

## 2020-05-30 NOTE — Telephone Encounter (Signed)
Pt has appt scheduled 06/03/2020 Truitt Merle, NP [facility] OFFICE for Pre op clearance per [name] at [facility]

## 2020-05-31 DIAGNOSIS — I220 Subsequent ST elevation (STEMI) myocardial infarction of anterior wall: Secondary | ICD-10-CM | POA: Diagnosis not present

## 2020-06-01 ENCOUNTER — Other Ambulatory Visit: Payer: Self-pay | Admitting: Physician Assistant

## 2020-06-01 DIAGNOSIS — I220 Subsequent ST elevation (STEMI) myocardial infarction of anterior wall: Secondary | ICD-10-CM | POA: Diagnosis not present

## 2020-06-02 DIAGNOSIS — I220 Subsequent ST elevation (STEMI) myocardial infarction of anterior wall: Secondary | ICD-10-CM | POA: Diagnosis not present
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
## 2020-06-03 ENCOUNTER — Ambulatory Visit: Payer: Medicaid Other

## 2020-06-03 ENCOUNTER — Other Ambulatory Visit: Payer: Self-pay

## 2020-06-03 ENCOUNTER — Encounter: Payer: Self-pay | Admitting: Nurse Practitioner

## 2020-06-03 ENCOUNTER — Ambulatory Visit: Payer: Medicaid Other | Admitting: Nurse Practitioner

## 2020-06-03 VITALS — BP 120/80 | HR 69 | Ht 65.0 in | Wt 237.1 lb

## 2020-06-03 DIAGNOSIS — I10 Essential (primary) hypertension: Secondary | ICD-10-CM | POA: Diagnosis not present

## 2020-06-03 DIAGNOSIS — Z720 Tobacco use: Secondary | ICD-10-CM

## 2020-06-03 DIAGNOSIS — Z01810 Encounter for preprocedural cardiovascular examination: Secondary | ICD-10-CM

## 2020-06-03 DIAGNOSIS — E785 Hyperlipidemia, unspecified: Secondary | ICD-10-CM

## 2020-06-03 DIAGNOSIS — I220 Subsequent ST elevation (STEMI) myocardial infarction of anterior wall: Secondary | ICD-10-CM | POA: Diagnosis not present

## 2020-06-03 DIAGNOSIS — I2510 Atherosclerotic heart disease of native coronary artery without angina pectoris: Secondary | ICD-10-CM | POA: Diagnosis not present

## 2020-06-03 NOTE — Pre-Procedure Instructions (Addendum)
Your procedure is scheduled on Wednesday, June 23rd from 09:50 AM- 11:40 AM.  Report to [facility] Main Entrance "A" at 07:50 A.M., and check in at the Admitting office.  Call this number if you have problems the morning of surgery:  [phone]  Call [phone] if you have any questions prior to your surgery date Monday-Friday 8am-4pm.    Remember:  Do not eat after midnight the night before your surgery.  You may drink clear liquids until 06:50 AM the morning of your surgery.   Clear liquids allowed are: Water, Non-Citrus Juices (without pulp), Carbonated Beverages, Clear Tea, Black Coffee Only, and Gatorade.    Enhanced Recovery after Surgery for Orthopedics Enhanced Recovery after Surgery is a protocol used to improve the stress on your body and your recovery after surgery.  Patient Instructions  . The night before surgery:  o No food after midnight. ONLY clear liquids after midnight   . The day of surgery (if you do NOT have diabetes):  o Drink ONE (1) Pre-Surgery Clear Ensure by 06:50 AM.   o This drink was given to you during your hospital  pre-op appointment visit.  o Nothing else to drink after completing the  Pre-Surgery Clear Ensure.         If you have questions, please contact your surgeon's office.    Take these medicines the morning of surgery with A SIP OF WATER: allopurinol (ZYLOPRIM) amLODipine (NORVASC) aspirin  metoprolol succinate (TOPROL-XL) tamoxifen (NOLVADEX) methadone (DOLOPHINE)  IF NEEDED: acetaminophen (TYLENOL) nitroGLYCERIN (NITROSTAT) oxyCODONE-acetaminophen (PERCOCET)   As of today, STOP taking any meloxicam (MOBIC), Aleve, Naproxen, Ibuprofen, Motrin, Advil, Goody's, BC's, all herbal medications, fish oil, and all vitamins.          The Morning of Surgery:            Do not wear jewelry, make up, or nail polish.            Do not wear lotions, powders, perfume, or deodorant.            Do not shave 48 hours prior to  surgery.            Do not bring valuables to the hospital.            [facility] is not responsible for any belongings or valuables.  Do NOT Smoke (Tobacco/Vapping) or drink Alcohol 24 hours prior to your procedure.  If you use a CPAP at night, you may bring all equipment for your overnight stay.   Contacts, glasses, dentures or bridgework may not be worn into surgery.      For patients admitted to the hospital, discharge time will be determined by your treatment team.   Patients discharged the day of surgery will not be allowed to drive home, and someone needs to stay with them for 24 hours.    Special instructions:   [facility]- Preparing For Surgery  Before surgery, you can play an important role. Because skin is not sterile, your skin needs to be as free of germs as possible. You can reduce the number of germs on your skin by washing with CHG (chlorahexidine gluconate) Soap before surgery.  CHG is an antiseptic cleaner which kills germs and bonds with the skin to continue killing germs even after washing.    Oral Hygiene is also important to reduce your risk of infection.  Remember - BRUSH YOUR TEETH THE MORNING OF SURGERY WITH YOUR REGULAR TOOTHPASTE  Please do not  use if you have an allergy to CHG or antibacterial soaps. If your skin becomes reddened/irritated stop using the CHG.  Do not shave (including legs and underarms) for at least 48 hours prior to first CHG shower. It is OK to shave your face.  Please follow these instructions carefully.   1. Shower the NIGHT BEFORE SURGERY and the MORNING OF SURGERY with CHG Soap.   2. If you chose to wash your hair, wash your hair first as usual with your normal shampoo.  3. After you shampoo, rinse your hair and body thoroughly to remove the shampoo.  4. Use CHG as you would any other liquid soap. You can apply CHG directly to the skin and wash gently with a scrungie or a clean washcloth.   5. Apply the CHG Soap to your body  ONLY FROM THE NECK DOWN.  Do not use on open wounds or open sores. Avoid contact with your eyes, ears, mouth and genitals (private parts). Wash Face and genitals (private parts)  with your normal soap.   6. Wash thoroughly, paying special attention to the area where your surgery will be performed.  7. Thoroughly rinse your body with warm water from the neck down.  8. DO NOT shower/wash with your normal soap after using and rinsing off the CHG Soap.  9. Pat yourself dry with a CLEAN TOWEL.  10. Wear CLEAN PAJAMAS to bed the night before surgery, wear comfortable clothes the morning of surgery  11. Place CLEAN SHEETS on your bed the night of your first shower and DO NOT SLEEP WITH PETS.   Day of Surgery: Shower with CHG Soap.  Do not apply any deodorants/lotions.  Please wear clean clothes to the hospital/surgery center.   Remember to brush your teeth WITH YOUR REGULAR TOOTHPASTE.   Please read over the following fact sheets that you were given.

## 2020-06-03 NOTE — Patient Instructions (Addendum)
After Visit Summary:  We will be checking the following labs today - NONE   Medication Instructions:    Continue with your current medicines.    If you need a refill on your cardiac medications before your next appointment, please call your pharmacy.     Testing/Procedures To Be Arranged:  N/A  Follow-Up:   See Dr. Tamala Julian in November -  You will receive a reminder letter in the mail two months in advance. If you don't receive a letter, please call our office to schedule the follow-up appointment.     At [facility], you and your health needs are our priority.  As part of our continuing mission to provide you with exceptional heart care, we have created designated Provider Care Teams.  These Care Teams include your primary Cardiologist (physician) and Advanced Practice Providers (APPs -  Physician Assistants and Nurse Practitioners) who all work together to provide you with the care you need, when you need it.  Special Instructions:  . Stay safe, wash your hands for at least 20 seconds and wear a mask when needed.  . It was good to talk with you today.  . I will send a note to Dr. Sharol Given   Call the [place] office at [phone] if you have any questions, problems or concerns.

## 2020-06-04 ENCOUNTER — Inpatient Hospital Stay (HOSPITAL_COMMUNITY)
Admission: RE | Admit: 2020-06-04 | Discharge: 2020-06-04 | Disposition: A | Discharge status: Home / Self Care | Payer: Medicaid Other | Source: Ambulatory Visit

## 2020-06-04 DIAGNOSIS — I220 Subsequent ST elevation (STEMI) myocardial infarction of anterior wall: Secondary | ICD-10-CM | POA: Diagnosis not present

## 2020-06-05 ENCOUNTER — Inpatient Hospital Stay (HOSPITAL_COMMUNITY): Admission: RE | Admit: 2020-06-05 | Payer: Medicaid Other | Source: Ambulatory Visit

## 2020-06-05 DIAGNOSIS — I220 Subsequent ST elevation (STEMI) myocardial infarction of anterior wall: Secondary | ICD-10-CM | POA: Diagnosis not present

## 2020-06-06 ENCOUNTER — Inpatient Hospital Stay (HOSPITAL_COMMUNITY): Admission: RE | Admit: 2020-06-06 | Payer: Medicaid Other | Source: Ambulatory Visit

## 2020-06-06 DIAGNOSIS — I220 Subsequent ST elevation (STEMI) myocardial infarction of anterior wall: Secondary | ICD-10-CM | POA: Diagnosis not present

## 2020-06-07 DIAGNOSIS — I220 Subsequent ST elevation (STEMI) myocardial infarction of anterior wall: Secondary | ICD-10-CM | POA: Diagnosis not present

## 2020-06-08 DIAGNOSIS — I220 Subsequent ST elevation (STEMI) myocardial infarction of anterior wall: Secondary | ICD-10-CM | POA: Diagnosis not present

## 2020-06-09 ENCOUNTER — Other Ambulatory Visit (HOSPITAL_COMMUNITY)
Admission: RE | Admit: 2020-06-09 | Discharge: 2020-06-09 | Disposition: A | Discharge status: Home / Self Care | Payer: Medicaid Other | Source: Ambulatory Visit | Attending: Orthopedic Surgery | Admitting: Orthopedic Surgery

## 2020-06-09 DIAGNOSIS — Z01812 Encounter for preprocedural laboratory examination: Secondary | ICD-10-CM | POA: Insufficient documentation

## 2020-06-09 DIAGNOSIS — Z20822 Contact with and (suspected) exposure to covid-19: Secondary | ICD-10-CM | POA: Insufficient documentation

## 2020-06-09 DIAGNOSIS — I220 Subsequent ST elevation (STEMI) myocardial infarction of anterior wall: Secondary | ICD-10-CM | POA: Diagnosis not present

## 2020-06-09 LAB — SARS CORONAVIRUS 2 (TAT 6-24 HRS): SARS Coronavirus 2: NEGATIVE

## 2020-06-10 ENCOUNTER — Encounter (HOSPITAL_COMMUNITY)
Admission: RE | Admit: 2020-06-10 | Discharge: 2020-06-10 | Disposition: A | Discharge status: Home / Self Care | Payer: Medicaid Other | Source: Ambulatory Visit | Attending: Orthopedic Surgery | Admitting: Orthopedic Surgery

## 2020-06-10 ENCOUNTER — Other Ambulatory Visit: Payer: Self-pay

## 2020-06-10 ENCOUNTER — Encounter (HOSPITAL_COMMUNITY): Payer: Self-pay

## 2020-06-10 DIAGNOSIS — Z01812 Encounter for preprocedural laboratory examination: Secondary | ICD-10-CM | POA: Diagnosis present

## 2020-06-10 DIAGNOSIS — I220 Subsequent ST elevation (STEMI) myocardial infarction of anterior wall: Secondary | ICD-10-CM | POA: Diagnosis not present

## 2020-06-10 LAB — BASIC METABOLIC PANEL
Anion gap: 8 (ref 5–15)
BUN: 7 mg/dL (ref 6–20)
CO2: 23 mmol/L (ref 22–32)
Calcium: 8.8 mg/dL — ABNORMAL LOW (ref 8.9–10.3)
Chloride: 111 mmol/L (ref 98–111)
Creatinine, Ser: 0.78 mg/dL (ref 0.44–1.00)
GFR calc Af Amer: >60 mL/min (ref >60)
GFR calc non Af Amer: >60 mL/min (ref >60)
Glucose, Bld: 108 mg/dL — ABNORMAL HIGH (ref 70–99)
Potassium: 3.4 mmol/L — ABNORMAL LOW (ref 3.5–5.1)
Sodium: 142 mmol/L (ref 135–145)

## 2020-06-10 LAB — CBC
HCT: 45.6 % (ref 36.0–46.0)
Hemoglobin: 14.7 g/dL (ref 12.0–15.0)
MCH: 28.4 pg (ref 26.0–34.0)
MCHC: 32.2 g/dL (ref 30.0–36.0)
MCV: 88 fL (ref 80.0–100.0)
Platelets: 480 10*3/uL — ABNORMAL HIGH (ref 150–400)
RBC: 5.18 MIL/uL — ABNORMAL HIGH (ref 3.87–5.11)
RDW: 16.9 % — ABNORMAL HIGH (ref 11.5–15.5)
WBC: 9.5 10*3/uL (ref 4.0–10.5)
nRBC: 0 % (ref 0.0–0.2)

## 2020-06-10 LAB — SURGICAL PCR SCREEN
MRSA, PCR: NEGATIVE
Staphylococcus aureus: NEGATIVE
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
## 2020-06-10 NOTE — Anesthesia Preprocedure Evaluation (Addendum)
Anesthesia Evaluation  Patient identified by MRN, date of birth, ID band Patient awake    Reviewed: Allergy & Precautions, NPO status , Patient's Chart, lab work & pertinent test results, reviewed documented beta blocker date and time   History of Anesthesia Complications Negative for: history of anesthetic complications  Airway Mallampati: II  TM Distance: >3 FB Neck ROM: Full    Dental  (+) Dental Advisory Given   Pulmonary Current Smoker,    Pulmonary exam normal        Cardiovascular hypertension, Pt. on medications and Pt. on home beta blockers + CAD, + Past MI and + Cardiac Stents  Normal cardiovascular exam     Neuro/Psych negative neurological ROS  negative psych ROS   GI/Hepatic negative GI ROS, Neg liver ROS,   Endo/Other  negative endocrine ROS  Renal/GU negative Renal ROS     Musculoskeletal  (+) Arthritis ,   Abdominal   Peds  Hematology  Thrombocytosis    Anesthesia Other Findings Covid test negative   Reproductive/Obstetrics  Breast cancer                              Lab Results  Component Value Date   WBC 9.5 06/10/2020   HGB 14.7 06/10/2020   HCT 45.6 06/10/2020   MCV 88.0 06/10/2020   PLT 480 (H) 06/10/2020   Lab Results  Component Value Date   CREATININE 0.78 06/10/2020   BUN 7 06/10/2020   NA 142 06/10/2020   K 3.4 (L) 06/10/2020   CL 111 06/10/2020   CO2 23 06/10/2020    Anesthesia Physical Anesthesia Plan  ASA: III  Anesthesia Plan: Spinal   Post-op Pain Management:  Regional for Post-op pain   Induction:   PONV Risk Score and Plan: 1 and Propofol infusion, Ondansetron and Treatment may vary due to age or medical condition  Airway Management Planned: Natural Airway and Simple Face Mask  Additional Equipment: None  Intra-op Plan:   Post-operative Plan:   Informed Consent: I have reviewed the patients History and Physical, chart,  labs and discussed the procedure including the risks, benefits and alternatives for the proposed anesthesia with the patient or authorized representative who has indicated his/her understanding and acceptance.       Plan Discussed with: CRNA and Anesthesiologist  Anesthesia Plan Comments: ( )      Anesthesia Quick Evaluation

## 2020-06-10 NOTE — Progress Notes (Signed)
PCP:  Maia Breslow, MD Cardiologist:  Daneen Schick, MD  EKG: 09/27/19 CXR:  N/A ECHO:  09/28/19 Stress Test:  12/11/19 Cardiac Cath:  07/09/13  Covid test 06/09/20  Anesthesia Review:  Yes, cardiac history.  Cardiac stents.   Patient denies shortness of breath, fever, cough, and chest pain at PAT appointment.  Patient verbalized understanding of instructions provided today at the PAT appointment.  Patient asked to review instructions at home and day of surgery.

## 2020-06-11 ENCOUNTER — Encounter (HOSPITAL_COMMUNITY): Payer: Self-pay | Admitting: Orthopedic Surgery

## 2020-06-11 ENCOUNTER — Inpatient Hospital Stay (HOSPITAL_COMMUNITY)
Admission: RE | Admit: 2020-06-11 | Discharge: 2020-06-13 | DRG: 470 | Disposition: A | Discharge status: Home Health | Payer: Medicaid Other | Attending: Orthopedic Surgery | Admitting: Orthopedic Surgery

## 2020-06-11 ENCOUNTER — Encounter (HOSPITAL_COMMUNITY)
Admission: RE | Disposition: A | Discharge status: Home Health | Payer: Self-pay | Source: Home / Self Care | Attending: Orthopedic Surgery

## 2020-06-11 ENCOUNTER — Inpatient Hospital Stay (HOSPITAL_COMMUNITY): Payer: Medicaid Other | Admitting: Vascular Surgery

## 2020-06-11 ENCOUNTER — Inpatient Hospital Stay (HOSPITAL_COMMUNITY): Payer: Medicaid Other | Admitting: Certified Registered"

## 2020-06-11 DIAGNOSIS — M1711 Unilateral primary osteoarthritis, right knee: Secondary | ICD-10-CM | POA: Diagnosis not present

## 2020-06-11 DIAGNOSIS — F17210 Nicotine dependence, cigarettes, uncomplicated: Secondary | ICD-10-CM | POA: Diagnosis present

## 2020-06-11 DIAGNOSIS — Z955 Presence of coronary angioplasty implant and graft: Secondary | ICD-10-CM

## 2020-06-11 DIAGNOSIS — I252 Old myocardial infarction: Secondary | ICD-10-CM

## 2020-06-11 DIAGNOSIS — I10 Essential (primary) hypertension: Secondary | ICD-10-CM | POA: Diagnosis present

## 2020-06-11 DIAGNOSIS — Z9221 Personal history of antineoplastic chemotherapy: Secondary | ICD-10-CM

## 2020-06-11 DIAGNOSIS — G8918 Other acute postprocedural pain: Secondary | ICD-10-CM | POA: Diagnosis not present

## 2020-06-11 DIAGNOSIS — I220 Subsequent ST elevation (STEMI) myocardial infarction of anterior wall: Secondary | ICD-10-CM | POA: Diagnosis not present

## 2020-06-11 DIAGNOSIS — I2510 Atherosclerotic heart disease of native coronary artery without angina pectoris: Secondary | ICD-10-CM | POA: Diagnosis not present

## 2020-06-11 DIAGNOSIS — Z853 Personal history of malignant neoplasm of breast: Secondary | ICD-10-CM

## 2020-06-11 DIAGNOSIS — Z8249 Family history of ischemic heart disease and other diseases of the circulatory system: Secondary | ICD-10-CM

## 2020-06-11 DIAGNOSIS — Z923 Personal history of irradiation: Secondary | ICD-10-CM

## 2020-06-11 HISTORY — PX: TOTAL KNEE ARTHROPLASTY: SHX125

## 2020-06-11 SURGERY — ARTHROPLASTY, KNEE, TOTAL
Anesthesia: Spinal | Site: Knee | Laterality: Right

## 2020-06-11 MED ORDER — ALLOPURINOL 300 MG PO TABS
300.0000 mg | ORAL_TABLET | Freq: Every day | ORAL | Status: DC
  Administered 2020-06-11 – 2020-06-13 (×3): 300 mg via ORAL
  Filled 2020-06-11 (×4): qty 1

## 2020-06-11 MED ORDER — CHLORHEXIDINE GLUCONATE 0.12 % MT SOLN: 15.0000 mL | Freq: Once | OROMUCOSAL | Status: AC

## 2020-06-11 MED ORDER — BUPIVACAINE IN DEXTROSE 0.75-8.25 % IT SOLN
INTRATHECAL | Status: DC | PRN
  Administered 2020-06-11: 1.6 mL via INTRATHECAL

## 2020-06-11 MED ORDER — CHLORHEXIDINE GLUCONATE 0.12 % MT SOLN
OROMUCOSAL | Status: AC
  Administered 2020-06-11: 15 mL via OROMUCOSAL
  Filled 2020-06-11: qty 15

## 2020-06-11 MED ORDER — ONDANSETRON HCL 4 MG/2ML IJ SOLN
INTRAMUSCULAR | Status: AC
  Filled 2020-06-11: qty 2

## 2020-06-11 MED ORDER — FENTANYL CITRATE (PF) 100 MCG/2ML IJ SOLN
INTRAMUSCULAR | Status: AC
  Administered 2020-06-11: 50 ug via INTRAVENOUS
  Filled 2020-06-11: qty 2

## 2020-06-11 MED ORDER — ONDANSETRON HCL 4 MG PO TABS: 4.0000 mg | ORAL_TABLET | Freq: Four times a day (QID) | ORAL | Status: DC | PRN

## 2020-06-11 MED ORDER — SODIUM CHLORIDE 0.9 % IV SOLN: INTRAVENOUS | Status: DC

## 2020-06-11 MED ORDER — PROPOFOL 10 MG/ML IV BOLUS
INTRAVENOUS | Status: DC | PRN
  Administered 2020-06-11 (×2): 20 mg via INTRAVENOUS

## 2020-06-11 MED ORDER — OXYCODONE HCL 5 MG PO TABS
5.0000 mg | ORAL_TABLET | ORAL | Status: DC | PRN
  Administered 2020-06-11: 10 mg via ORAL
  Administered 2020-06-11: 5 mg via ORAL
  Administered 2020-06-12 – 2020-06-13 (×3): 10 mg via ORAL
  Filled 2020-06-11 (×6): qty 2

## 2020-06-11 MED ORDER — FENTANYL CITRATE (PF) 100 MCG/2ML IJ SOLN
INTRAMUSCULAR | Status: AC
  Filled 2020-06-11: qty 2

## 2020-06-11 MED ORDER — CEFAZOLIN SODIUM-DEXTROSE 2-4 GM/100ML-% IV SOLN
INTRAVENOUS | Status: AC
  Administered 2020-06-11: 2 g via INTRAVENOUS
  Filled 2020-06-11: qty 100

## 2020-06-11 MED ORDER — CEFAZOLIN SODIUM-DEXTROSE 2-4 GM/100ML-% IV SOLN
2.0000 g | Freq: Four times a day (QID) | INTRAVENOUS | Status: AC
  Administered 2020-06-11: 2 g via INTRAVENOUS
  Filled 2020-06-11 (×2): qty 100

## 2020-06-11 MED ORDER — CLONIDINE HCL (ANALGESIA) 100 MCG/ML EP SOLN
EPIDURAL | Status: DC | PRN
Start: 2020-06-11 — End: 2020-06-11
  Administered 2020-06-11: 50 ug

## 2020-06-11 MED ORDER — PHENYLEPHRINE HCL-NACL 10-0.9 MG/250ML-% IV SOLN
INTRAVENOUS | Status: DC | PRN
  Administered 2020-06-11: 60 ug/min via INTRAVENOUS

## 2020-06-11 MED ORDER — ACETAMINOPHEN 325 MG PO TABS: 325.0000 mg | ORAL_TABLET | Freq: Four times a day (QID) | ORAL | Status: DC | PRN

## 2020-06-11 MED ORDER — ACETAMINOPHEN 500 MG PO TABS: 1000.0000 mg | ORAL_TABLET | Freq: Once | ORAL | Status: AC

## 2020-06-11 MED ORDER — ORAL CARE MOUTH RINSE: 15.0000 mL | Freq: Once | OROMUCOSAL | Status: AC

## 2020-06-11 MED ORDER — ATORVASTATIN CALCIUM 40 MG PO TABS
40.0000 mg | ORAL_TABLET | Freq: Every day | ORAL | Status: DC
  Administered 2020-06-11 – 2020-06-12 (×2): 40 mg via ORAL
  Filled 2020-06-11 (×2): qty 1

## 2020-06-11 MED ORDER — METOCLOPRAMIDE HCL 5 MG PO TABS: 5.0000 mg | ORAL_TABLET | Freq: Three times a day (TID) | ORAL | Status: DC | PRN

## 2020-06-11 MED ORDER — MIDAZOLAM HCL 2 MG/2ML IJ SOLN: 1.0000 mg | Freq: Once | INTRAMUSCULAR | Status: AC

## 2020-06-11 MED ORDER — METOPROLOL SUCCINATE ER 50 MG PO TB24
100.0000 mg | ORAL_TABLET | Freq: Every day | ORAL | Status: DC
  Administered 2020-06-11 – 2020-06-13 (×3): 100 mg via ORAL
  Filled 2020-06-11 (×3): qty 2

## 2020-06-11 MED ORDER — DOCUSATE SODIUM 100 MG PO CAPS
100.0000 mg | ORAL_CAPSULE | Freq: Two times a day (BID) | ORAL | Status: DC
  Administered 2020-06-11 – 2020-06-13 (×4): 100 mg via ORAL
  Filled 2020-06-11 (×4): qty 1

## 2020-06-11 MED ORDER — TRANEXAMIC ACID 1000 MG/10ML IV SOLN
INTRAVENOUS | Status: DC | PRN
  Administered 2020-06-11: 2000 mg via TOPICAL

## 2020-06-11 MED ORDER — FENTANYL CITRATE (PF) 100 MCG/2ML IJ SOLN: 50.0000 ug | Freq: Once | INTRAMUSCULAR | Status: AC

## 2020-06-11 MED ORDER — ACETAMINOPHEN 500 MG PO TABS
ORAL_TABLET | ORAL | Status: AC
  Administered 2020-06-11: 1000 mg via ORAL
  Filled 2020-06-11: qty 2

## 2020-06-11 MED ORDER — 0.9 % SODIUM CHLORIDE (POUR BTL) OPTIME
TOPICAL | Status: DC | PRN
  Administered 2020-06-11: 1000 mL

## 2020-06-11 MED ORDER — ISOSORBIDE MONONITRATE ER 60 MG PO TB24
60.0000 mg | ORAL_TABLET | Freq: Every day | ORAL | Status: DC
  Administered 2020-06-11 – 2020-06-12 (×2): 60 mg via ORAL
  Filled 2020-06-11 (×2): qty 1

## 2020-06-11 MED ORDER — METHOCARBAMOL 500 MG PO TABS
500.0000 mg | ORAL_TABLET | Freq: Four times a day (QID) | ORAL | Status: DC | PRN
  Administered 2020-06-11 – 2020-06-13 (×5): 500 mg via ORAL
  Filled 2020-06-11 (×5): qty 1

## 2020-06-11 MED ORDER — METHOCARBAMOL 1000 MG/10ML IJ SOLN
500.0000 mg | Freq: Four times a day (QID) | INTRAVENOUS | Status: DC | PRN
  Filled 2020-06-11: qty 5

## 2020-06-11 MED ORDER — OXYCODONE HCL 5 MG PO TABS
ORAL_TABLET | ORAL | Status: AC
  Filled 2020-06-11: qty 1

## 2020-06-11 MED ORDER — FENTANYL CITRATE (PF) 100 MCG/2ML IJ SOLN
25.0000 ug | INTRAMUSCULAR | Status: DC | PRN
  Administered 2020-06-11 (×2): 25 ug via INTRAVENOUS

## 2020-06-11 MED ORDER — POTASSIUM CHLORIDE CRYS ER 20 MEQ PO TBCR
20.0000 meq | EXTENDED_RELEASE_TABLET | Freq: Three times a day (TID) | ORAL | Status: DC
  Administered 2020-06-11 – 2020-06-13 (×7): 20 meq via ORAL
  Filled 2020-06-11 (×8): qty 1

## 2020-06-11 MED ORDER — DEXAMETHASONE SODIUM PHOSPHATE 10 MG/ML IJ SOLN
INTRAMUSCULAR | Status: DC | PRN
  Administered 2020-06-11: 5 mg via INTRAVENOUS

## 2020-06-11 MED ORDER — LACTATED RINGERS IV SOLN: INTRAVENOUS | Status: DC

## 2020-06-11 MED ORDER — ONDANSETRON HCL 4 MG/2ML IJ SOLN
INTRAMUSCULAR | Status: DC | PRN
  Administered 2020-06-11: 4 mg via INTRAVENOUS

## 2020-06-11 MED ORDER — TAMOXIFEN CITRATE 10 MG PO TABS
20.0000 mg | ORAL_TABLET | Freq: Every day | ORAL | Status: DC
  Administered 2020-06-11 – 2020-06-13 (×3): 20 mg via ORAL
  Filled 2020-06-11 (×3): qty 2

## 2020-06-11 MED ORDER — MIDAZOLAM HCL 2 MG/2ML IJ SOLN
INTRAMUSCULAR | Status: AC
  Administered 2020-06-11: 1 mg via INTRAVENOUS
  Filled 2020-06-11: qty 2

## 2020-06-11 MED ORDER — ASPIRIN EC 81 MG PO TBEC
81.0000 mg | DELAYED_RELEASE_TABLET | Freq: Every day | ORAL | Status: DC
  Administered 2020-06-11 – 2020-06-12 (×2): 81 mg via ORAL
  Filled 2020-06-11 (×2): qty 1

## 2020-06-11 MED ORDER — CEFAZOLIN SODIUM-DEXTROSE 2-4 GM/100ML-% IV SOLN
2.0000 g | INTRAVENOUS | Status: AC
  Administered 2020-06-11: 2 g via INTRAVENOUS

## 2020-06-11 MED ORDER — NITROGLYCERIN 0.4 MG SL SUBL: 0.4000 mg | SUBLINGUAL_TABLET | SUBLINGUAL | Status: DC | PRN

## 2020-06-11 MED ORDER — TRANEXAMIC ACID 1000 MG/10ML IV SOLN
2000.0000 mg | Freq: Once | INTRAVENOUS | Status: AC
  Administered 2020-06-11: 1000 mg via TOPICAL
  Filled 2020-06-11: qty 20

## 2020-06-11 MED ORDER — METOCLOPRAMIDE HCL 5 MG/ML IJ SOLN: 5.0000 mg | Freq: Three times a day (TID) | INTRAMUSCULAR | Status: DC | PRN

## 2020-06-11 MED ORDER — TRANEXAMIC ACID-NACL 1000-0.7 MG/100ML-% IV SOLN
INTRAVENOUS | Status: AC
  Filled 2020-06-11: qty 100

## 2020-06-11 MED ORDER — METHADONE HCL 5 MG PO TABS
5.0000 mg | ORAL_TABLET | Freq: Three times a day (TID) | ORAL | Status: DC
  Administered 2020-06-11 – 2020-06-13 (×5): 5 mg via ORAL
  Filled 2020-06-11 (×6): qty 1

## 2020-06-11 MED ORDER — FENTANYL CITRATE (PF) 250 MCG/5ML IJ SOLN
INTRAMUSCULAR | Status: AC
  Filled 2020-06-11: qty 5

## 2020-06-11 MED ORDER — MENTHOL 3 MG MT LOZG: 1.0000 | LOZENGE | OROMUCOSAL | Status: DC | PRN

## 2020-06-11 MED ORDER — ONDANSETRON HCL 4 MG/2ML IJ SOLN: 4.0000 mg | Freq: Four times a day (QID) | INTRAMUSCULAR | Status: DC | PRN

## 2020-06-11 MED ORDER — AMLODIPINE BESYLATE 5 MG PO TABS
5.0000 mg | ORAL_TABLET | Freq: Every day | ORAL | Status: DC
  Administered 2020-06-11 – 2020-06-13 (×3): 5 mg via ORAL
  Filled 2020-06-11 (×4): qty 1

## 2020-06-11 MED ORDER — HYDROMORPHONE HCL 1 MG/ML IJ SOLN
0.5000 mg | INTRAMUSCULAR | Status: DC | PRN
  Administered 2020-06-11 – 2020-06-13 (×6): 0.5 mg via INTRAVENOUS
  Filled 2020-06-11 (×6): qty 1

## 2020-06-11 MED ORDER — PHENOL 1.4 % MT LIQD: 1.0000 | OROMUCOSAL | Status: DC | PRN

## 2020-06-11 MED ORDER — DEXAMETHASONE SODIUM PHOSPHATE 10 MG/ML IJ SOLN
INTRAMUSCULAR | Status: AC
  Filled 2020-06-11: qty 1

## 2020-06-11 MED ORDER — MIDAZOLAM HCL 2 MG/2ML IJ SOLN
INTRAMUSCULAR | Status: AC
  Filled 2020-06-11: qty 2

## 2020-06-11 MED ORDER — PROPOFOL 500 MG/50ML IV EMUL
INTRAVENOUS | Status: DC | PRN
  Administered 2020-06-11: 100 ug/kg/min via INTRAVENOUS

## 2020-06-11 MED ORDER — ROPIVACAINE HCL 5 MG/ML IJ SOLN
INTRAMUSCULAR | Status: DC | PRN
  Administered 2020-06-11: 20 mL via PERINEURAL

## 2020-06-11 SURGICAL SUPPLY — 46 items
ATTUNE PS FEM RT SZ 5 CEM KNEE (Femur) ×2 IMPLANT
BASEPLATE TIB CMT FB PCKT SZ6 (Knees) ×2 IMPLANT
BLADE SAGITTAL 25.0X1.19X90 (BLADE) ×2 IMPLANT
BLADE SAGITTAL 25.0X1.19X90MM (BLADE) ×1
BLADE SAW SGTL 13X75X1.27 (BLADE) ×3 IMPLANT
BLADE SURG 21 STRL SS (BLADE) ×6 IMPLANT
BOWL SMART MIX CTS (DISPOSABLE) ×3 IMPLANT
BSPLAT TIB 6 CMNT FXBRNG STRL (Knees) ×1 IMPLANT
CEMENT BONE R 1X40 (Cement) ×6 IMPLANT
COVER SURGICAL LIGHT HANDLE (MISCELLANEOUS) ×3 IMPLANT
CUFF TOURN SGL QUICK 34 (TOURNIQUET CUFF) ×3
CUFF TRNQT CYL 34X4.125X (TOURNIQUET CUFF) ×1 IMPLANT
DRAPE EXTREMITY T 121X128X90 (DISPOSABLE) ×3 IMPLANT
DRAPE HALF SHEET 40X57 (DRAPES) ×6 IMPLANT
DRAPE U-SHAPE 47X51 STRL (DRAPES) ×3 IMPLANT
DURAPREP 26ML APPLICATOR (WOUND CARE) ×3 IMPLANT
ELECT REM PT RETURN 9FT ADLT (ELECTROSURGICAL) ×3
ELECTRODE REM PT RTRN 9FT ADLT (ELECTROSURGICAL) ×1 IMPLANT
FACESHIELD WRAPAROUND (MASK) ×3 IMPLANT
FACESHIELD WRAPAROUND OR TEAM (MASK) ×1 IMPLANT
GLOVE BIOGEL PI IND STRL 9 (GLOVE) ×1 IMPLANT
GLOVE BIOGEL PI INDICATOR 9 (GLOVE) ×2
GLOVE SURG ORTHO 9.0 STRL STRW (GLOVE) ×3 IMPLANT
GOWN STRL REUS W/ TWL XL LVL3 (GOWN DISPOSABLE) ×2 IMPLANT
GOWN STRL REUS W/TWL XL LVL3 (GOWN DISPOSABLE) ×6
HANDPIECE INTERPULSE COAX TIP (DISPOSABLE) ×3
INSERT TIB ATTUNE FB SZ5X7 (Insert) ×2 IMPLANT
KIT BASIN OR (CUSTOM PROCEDURE TRAY) ×3 IMPLANT
KIT TURNOVER KIT B (KITS) ×3 IMPLANT
MANIFOLD NEPTUNE II (INSTRUMENTS) ×3 IMPLANT
NS IRRIG 1000ML POUR BTL (IV SOLUTION) ×3 IMPLANT
PACK TOTAL JOINT (CUSTOM PROCEDURE TRAY) ×3 IMPLANT
PAD ARMBOARD 7.5X6 YLW CONV (MISCELLANEOUS) ×3 IMPLANT
PATELLA MEDIAL ATTUN 35MM KNEE (Knees) ×2 IMPLANT
PIN DRILL FIX HALF THREAD (BIT) ×2 IMPLANT
PIN STEINMAN FIXATION KNEE (PIN) ×2 IMPLANT
PREVENA RESTOR ARTHOFORM 46X30 (CANNISTER) ×2 IMPLANT
SET HNDPC FAN SPRY TIP SCT (DISPOSABLE) ×1 IMPLANT
STAPLER VISISTAT 35W (STAPLE) ×3 IMPLANT
SUT VIC AB 0 CT1 27 (SUTURE) ×3
SUT VIC AB 0 CT1 27XBRD ANBCTR (SUTURE) ×1 IMPLANT
SUT VIC AB 1 CTX 36 (SUTURE)
SUT VIC AB 1 CTX36XBRD ANBCTR (SUTURE) IMPLANT
TOWEL GREEN STERILE (TOWEL DISPOSABLE) ×3 IMPLANT
TOWEL GREEN STERILE FF (TOWEL DISPOSABLE) ×3 IMPLANT
WRAP KNEE MAXI GEL POST OP (GAUZE/BANDAGES/DRESSINGS) ×3 IMPLANT

## 2020-06-11 NOTE — Anesthesia Procedure Notes (Signed)
Procedure Name: MAC Date/Time: 06/11/2020 10:42 AM Performed by: Barrington Ellison, CRNA Pre-anesthesia Checklist: Patient identified, Emergency Drugs available, Suction available, Patient being monitored and Timeout performed Patient Re-evaluated:Patient Re-evaluated prior to induction Oxygen Delivery Method: Simple face mask

## 2020-06-11 NOTE — Plan of Care (Signed)

## 2020-06-11 NOTE — Op Note (Addendum)
DATE OF SURGERY:  06/11/2020  TIME: 12:23 PM  PATIENT NAME:  [name]    AGE: 59 y.o.    PRE-OPERATIVE DIAGNOSIS:  Osteoarthritis Right Knee  POST-OPERATIVE DIAGNOSIS:  Osteoarthritis Right Knee  PROCEDURE:  Procedure(s): RIGHT TOTAL KNEE ARTHROPLASTY  SURGEON: Meridee Score  ASSISTANT: Kendra  OPERATIVE IMPLANTS: Depuy , Posterior Stabilized.  Femur size 5, Tibia size 6, Patella size 35 3-peg oval button, with a 7 mm polyethylene insert.  @ENCIMAGES @      PREOPERATIVE INDICATIONS:   [name] is a 59 y.o. year old female with end stage degenerative arthritis of the knee who failed conservative treatment and elected for Total Knee Arthroplasty.   The risks, benefits, and alternatives were discussed at length including but not limited to the risks of infection, bleeding, nerve injury, stiffness, blood clots, the need for revision surgery, cardiopulmonary complications, among others, and they were willing to proceed.  OPERATIVE DESCRIPTION:  The patient was brought to the operative room and placed in a supine position.  General anesthesia was administered.  IV antibiotics were given.  The lower extremity was prepped and draped in the usual sterile fashion.  [name] was used to cover all exposed skin. Time out was performed.    Anterior quadriceps tendon splitting approach was performed.  The patella was everted and osteophytes were removed.  The anterior horn of the medial and lateral meniscus was removed.   The distal femur was opened with the drill and the intramedullary distal femoral cutting jig was utilized, set at 5 degrees valgus resecting 9 mm off the distal femur.  Care was taken to protect the collateral ligaments.  Then the extramedullary tibial cutting jig was utilized set for 3 degree posterior slope.  Care was taken during the cut to protect the medial and collateral ligaments.  The proximal tibia was removed along with the posterior horns of the  menisci.  The PCL was sacrificed.    The extensor gap was measured and was approximately 7 mm.    The distal femoral sizing jig was applied, taking care to avoid notching.  Then the 4-in-1 cutting jig was applied and the anterior and posterior femur was cut, along with the chamfer cuts.  All posterior osteophytes were removed.  The flexion gap was then measured and was symmetric with the extension gap.  The distal femoral preparation using the appropriate jig to prepare the box.  The patella was then measured, and cut with the saw.    The proximal tibia sized and prepared accordingly with the reamer and the punch, and then all components were trialed with the poly insert.  The knee was found to have stable balance and full motion.  The knee was irrigated with normal saline and the knee was soaked with TXA.  The above named components were then cemented into place and all excess cement was removed.  The final polyethylene component was in place during cementation.  The knee was kept in extension until the cement hardened.  The knee was then taken through a range of motion and the patella tracked well and the knee irrigated copiously and the parapatellar and subcutaneous tissue closed with vicryl, and skin closed with staples..  A sterile dressing was applied and patient  was taken to the PACU in stable  condition.  There were no complications.  Total tourniquet time was 34 minutes.

## 2020-06-11 NOTE — Transfer of Care (Signed)
Immediate Anesthesia Transfer of Care Note  Patient: [name]  Procedure(s) Performed: RIGHT TOTAL KNEE ARTHROPLASTY (Right Knee)  Patient Location: PACU  Anesthesia Type:Regional and Spinal  Level of Consciousness: awake and alert   Airway & Oxygen Therapy: Patient Spontanous Breathing  Post-op Assessment: Report given to RN and Post -op Vital signs reviewed and stable  Post vital signs: Reviewed and stable  Last Vitals:  Vitals Value Taken Time  BP 94/58 06/11/20 1229  Temp    Pulse 52 06/11/20 1230  Resp 14 06/11/20 1230  SpO2 96 % 06/11/20 1230  Vitals shown include unvalidated device data.  Last Pain:  Vitals:   06/11/20 0955  TempSrc:   PainSc: Asleep      Patients Stated Pain Goal: 3 (24/19/91 4445)  Complications: No complications documented.

## 2020-06-11 NOTE — Anesthesia Procedure Notes (Signed)
Spinal  Patient location during procedure: OR Start time: 06/11/2020 10:44 AM End time: 06/11/2020 10:48 AM Staffing Performed: anesthesiologist  Anesthesiologist: Audry Pili, MD Preanesthetic Checklist Completed: patient identified, IV checked, risks and benefits discussed, surgical consent, monitors and equipment checked, pre-op evaluation and timeout performed Spinal Block Patient position: sitting Prep: DuraPrep Patient monitoring: heart rate, cardiac monitor, continuous pulse ox and blood pressure Approach: midline Location: L3-4 Injection technique: single-shot Needle Needle type: Pencan  Needle gauge: 24 G Additional Notes Consent was obtained prior to the procedure with all questions answered and concerns addressed. Risks including, but not limited to, bleeding, infection, nerve damage, paralysis, failed block, inadequate analgesia, allergic reaction, high spinal, itching, and headache were discussed and the patient wished to proceed. Functioning IV was confirmed and monitors were applied. Sterile prep and drape, including hand hygiene, mask, and sterile gloves were used. The patient was positioned and the spine was prepped. The skin was anesthetized with lidocaine. Free flow of clear CSF was obtained prior to injecting local anesthetic into the CSF. The spinal needle aspirated freely following injection. The needle was carefully withdrawn. The patient tolerated the procedure well.   Renold Don, MD
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
## 2020-06-11 NOTE — Evaluation (Signed)
Physical Therapy Evaluation Patient Details Name: [name] MRN: [id number] DOB: 07-13-1961 Today's Date: 06/11/2020   History of Present Illness  Pt is a 59 y/o female s/p R TKA. PMH includes breast cancer, HTN, CAD, and gout.   Clinical Impression  Pt is s/p surgery above with deficits below. Pt limited in mobility as pt's nerve block still in effect and with extensive extensor lag in supine. Was able to sit at EOB with assist for RLE. Educated about knee precautions and supine HEP. Will continue to follow acutely to maximize functional mobility independence and safety.     Follow Up Recommendations Follow surgeon's recommendation for DC plan and follow-up therapies;Supervision for mobility/OOB    Equipment Recommendations  Rolling walker with 5" wheels;3in1 (PT)    Recommendations for Other Services       Precautions / Restrictions Precautions Precautions: Knee Precaution Booklet Issued: No Precaution Comments: Verbally reviewed knee precautions.  Restrictions Weight Bearing Restrictions: Yes RLE Weight Bearing: Weight bearing as tolerated      Mobility  Bed Mobility Overal bed mobility: Needs Assistance Bed Mobility: Supine to Sit;Sit to Supine     Supine to sit: Min assist Sit to supine: Min assist   General bed mobility comments: Required assist for RLE management to come to sitting. Further mobility deferred as nerve block effects still in place.   Transfers                    Ambulation/Gait                Stairs            Wheelchair Mobility    Modified Rankin (Stroke Patients Only)       Balance Overall balance assessment: Needs assistance Sitting-balance support: No upper extremity supported;Feet supported Sitting balance-Leahy Scale: Fair                                       Pertinent Vitals/Pain Pain Assessment: Faces Faces Pain Scale: Hurts little more Pain Location: R knee  Pain Descriptors /  Indicators: Aching Pain Intervention(s): Limited activity within patient's tolerance;Monitored during session;Repositioned    Home Living Family/patient expects to be discharged to:: Private residence Living Arrangements: Spouse/significant other Available Help at Discharge: Family;Available PRN/intermittently Type of Home: House Home Access: Stairs to enter Entrance Stairs-Rails: None Entrance Stairs-Number of Steps: 3 (can fit walker on all 3 steps ) Home Layout: One level Home Equipment: Cane - single point      Prior Function Level of Independence: Needs assistance   Gait / Transfers Assistance Needed: Uses cane for ambulation   ADL's / Homemaking Assistance Needed: Has an aide that comes 7 days/week for 2 hours to help with bathing/dressing.         Hand Dominance        Extremity/Trunk Assessment   Upper Extremity Assessment Upper Extremity Assessment: Overall WFL for tasks assessed    Lower Extremity Assessment Lower Extremity Assessment: RLE deficits/detail RLE Deficits / Details: Nerve block still in place. Pt very numb with extensive extensor lag.     Cervical / Trunk Assessment Cervical / Trunk Assessment: Normal  Communication   Communication: No difficulties  Cognition Arousal/Alertness: Awake/alert Behavior During Therapy: WFL for tasks assessed/performed Overall Cognitive Status: Within Functional Limits for tasks assessed  General Comments      Exercises Total Joint Exercises Ankle Circles/Pumps: AROM;Both;20 reps;Supine Quad Sets: AROM;Right;5 reps   Assessment/Plan    PT Assessment Patient needs continued PT services  PT Problem List Decreased strength;Decreased activity tolerance;Decreased range of motion;Decreased balance;Decreased mobility;Decreased knowledge of use of DME;Decreased knowledge of precautions;Pain       PT Treatment Interventions DME instruction;Gait training;Stair  training;Functional mobility training;Therapeutic activities;Therapeutic exercise;Balance training;Patient/family education    PT Goals (Current goals can be found in the Care Plan section)  Acute Rehab PT Goals Patient Stated Goal: to go home PT Goal Formulation: With patient Time For Goal Achievement: 06/25/20 Potential to Achieve Goals: Good    Frequency 7X/week   Barriers to discharge        Co-evaluation               AM-PAC PT "6 Clicks" Mobility  Outcome Measure Help needed turning from your back to your side while in a flat bed without using bedrails?: A Little Help needed moving from lying on your back to sitting on the side of a flat bed without using bedrails?: A Little Help needed moving to and from a bed to a chair (including a wheelchair)?: A Lot Help needed standing up from a chair using your arms (e.g., wheelchair or bedside chair)?: A Lot Help needed to walk in hospital room?: A Lot Help needed climbing 3-5 steps with a railing? : Total 6 Click Score: 13    End of Session Equipment Utilized During Treatment: Gait belt Activity Tolerance: Patient tolerated treatment well Patient left: in bed;with call bell/phone within reach;with bed alarm set Nurse Communication: Mobility status PT Visit Diagnosis: Unsteadiness on feet (R26.81);Muscle weakness (generalized) (M62.81);Difficulty in walking, not elsewhere classified (R26.2);Pain Pain - Right/Left: Right Pain - part of body: Knee    Time: 1219-7588 PT Time Calculation (min) (ACUTE ONLY): 16 min   Charges:   PT Evaluation $PT Eval Low Complexity: 1 Low          Lou Miner, DPT  Acute Rehabilitation Services  Pager: [phone] Office: [phone]   Rudean Hitt 06/11/2020, 4:55 PM

## 2020-06-11 NOTE — Anesthesia Procedure Notes (Signed)
Anesthesia Regional Block: Adductor canal block   Pre-Anesthetic Checklist: ,, timeout performed, Correct Patient, Correct Site, Correct Laterality, Correct Procedure, Correct Position, site marked, Risks and benefits discussed,  Surgical consent,  Pre-op evaluation,  At surgeon's request and post-op pain management  Laterality: Right  Prep: chloraprep       Needles:  Injection technique: Single-shot  Needle Type: Echogenic Needle     Needle Length: 9cm  Needle Gauge: 21     Additional Needles:   Procedures:,,,, ultrasound used (permanent image in chart),,,,  Narrative:  Start time: 06/11/2020 9:29 AM End time: 06/11/2020 9:34 AM Injection made incrementally with aspirations every 5 mL.  Performed by: Personally  Anesthesiologist: Suzette Battiest, MD

## 2020-06-12 ENCOUNTER — Telehealth: Payer: Self-pay | Admitting: Orthopedic Surgery

## 2020-06-12 ENCOUNTER — Encounter (HOSPITAL_COMMUNITY): Payer: Self-pay | Admitting: Orthopedic Surgery

## 2020-06-12 ENCOUNTER — Ambulatory Visit: Payer: Medicaid Other | Admitting: Obstetrics & Gynecology

## 2020-06-12 DIAGNOSIS — F17210 Nicotine dependence, cigarettes, uncomplicated: Secondary | ICD-10-CM | POA: Diagnosis present

## 2020-06-12 DIAGNOSIS — M1711 Unilateral primary osteoarthritis, right knee: Secondary | ICD-10-CM | POA: Diagnosis not present

## 2020-06-12 DIAGNOSIS — Z853 Personal history of malignant neoplasm of breast: Secondary | ICD-10-CM | POA: Diagnosis not present

## 2020-06-12 DIAGNOSIS — Z9221 Personal history of antineoplastic chemotherapy: Secondary | ICD-10-CM | POA: Diagnosis not present

## 2020-06-12 DIAGNOSIS — Z8249 Family history of ischemic heart disease and other diseases of the circulatory system: Secondary | ICD-10-CM | POA: Diagnosis not present

## 2020-06-12 DIAGNOSIS — I252 Old myocardial infarction: Secondary | ICD-10-CM | POA: Diagnosis not present

## 2020-06-12 DIAGNOSIS — I220 Subsequent ST elevation (STEMI) myocardial infarction of anterior wall: Secondary | ICD-10-CM | POA: Diagnosis not present

## 2020-06-12 DIAGNOSIS — Z955 Presence of coronary angioplasty implant and graft: Secondary | ICD-10-CM | POA: Diagnosis not present

## 2020-06-12 DIAGNOSIS — I10 Essential (primary) hypertension: Secondary | ICD-10-CM | POA: Diagnosis present

## 2020-06-12 DIAGNOSIS — Z923 Personal history of irradiation: Secondary | ICD-10-CM | POA: Diagnosis not present

## 2020-06-12 DIAGNOSIS — I2510 Atherosclerotic heart disease of native coronary artery without angina pectoris: Secondary | ICD-10-CM | POA: Diagnosis present

## 2020-06-12 NOTE — Progress Notes (Signed)
Patient is postop day 1 status post right total knee replacement.  She is sitting comfortably in bed.  She does have a she is having quite a bit of pain but trying to tolerate this.  Vital signs stable a bit febrile wound VAC currently is not functioning.  Battery is dead.  Compartments are soft and nontender  Status post above overall doing well.  Plan for discharge with home health tomorrow will change to inpatient status.  Charge her for [facility] was retrieved and plug-in VAC appears to be functioning

## 2020-06-12 NOTE — Anesthesia Postprocedure Evaluation (Signed)
Anesthesia Post Note  Patient: [name]  Procedure(s) Performed: RIGHT TOTAL KNEE ARTHROPLASTY (Right Knee)     Patient location during evaluation: PACU Anesthesia Type: Spinal Level of consciousness: awake and alert Pain management: pain level controlled Vital Signs Assessment: post-procedure vital signs reviewed and stable Respiratory status: spontaneous breathing and respiratory function stable Cardiovascular status: blood pressure returned to baseline and stable Postop Assessment: spinal receding and no apparent nausea or vomiting Anesthetic complications: no   No complications documented.  Last Vitals:  Vitals:   06/11/20 1517 06/11/20 1918  BP: 137/90 133/77  Pulse: 76 81  Resp: 17 19  Temp: 36.8 C 36.9 C  SpO2: 100% 96%                  Audry Pili

## 2020-06-12 NOTE — Plan of Care (Signed)

## 2020-06-12 NOTE — Progress Notes (Signed)
Physical Therapy Treatment Patient Details Name: [name] MRN: [id number] DOB: 1961/07/25 Today's Date: 06/12/2020    History of Present Illness Pt is a 59 y/o female s/p R TKA. PMH includes breast cancer, HTN, CAD, and gout.     PT Comments    Pt supine in bed on arrival.  Motivated to mobilize.  Performed LE exercises and gt progression.  Continue to follow acutely to progress mobility.     Follow Up Recommendations  Follow surgeon's recommendation for DC plan and follow-up therapies;Supervision for mobility/OOB     Equipment Recommendations  Rolling walker with 5" wheels;3in1 (PT)    Recommendations for Other Services       Precautions / Restrictions Precautions Precautions: Knee Precaution Booklet Issued: No Precaution Comments: Verbally reviewed knee precautions.  Required Braces or Orthoses: Other Brace Other Brace: L darco shoe Restrictions Weight Bearing Restrictions: Yes RLE Weight Bearing: Weight bearing as tolerated    Mobility  Bed Mobility Overal bed mobility: Needs Assistance Bed Mobility: Supine to Sit;Sit to Supine     Supine to sit: Min assist     General bed mobility comments: Min assistance to move to edge of bed and elevate trunk into a seated position.  Transfers Overall transfer level: Needs assistance Equipment used: Rolling walker (2 wheeled) Transfers: Sit to/from Stand Sit to Stand: Min assist         General transfer comment: Cues for hand placement to and from seated surface.  Ambulation/Gait Ambulation/Gait assistance: Min guard Gait Distance (Feet): 40 Feet Assistive device: Rolling walker (2 wheeled) Gait Pattern/deviations: Step-to pattern;Shuffle;Antalgic;Decreased stride length     General Gait Details: Cues for sequencing and gt symmetry.   Stairs             Wheelchair Mobility    Modified Rankin (Stroke Patients Only)       Balance Overall balance assessment: Needs assistance Sitting-balance  support: No upper extremity supported;Feet supported Sitting balance-Leahy Scale: Fair       Standing balance-Leahy Scale: Poor                              Cognition Arousal/Alertness: Awake/alert Behavior During Therapy: WFL for tasks assessed/performed Overall Cognitive Status: Within Functional Limits for tasks assessed                                        Exercises Total Joint Exercises Ankle Circles/Pumps: AROM;Both;20 reps;Supine Quad Sets: AROM;Right;10 reps;Supine Heel Slides: AROM;AAROM;Right;10 reps;Supine Hip ABduction/ADduction: AROM;AAROM;Right;10 reps;Supine Straight Leg Raises: AROM;AAROM;Right;10 reps;Supine Goniometric ROM: 15-71 degrees R knee    General Comments        Pertinent Vitals/Pain Pain Assessment: 0-10 Pain Score: 10-Worst pain ever Pain Location: R knee Pain Descriptors / Indicators: Aching Pain Intervention(s): Monitored during session;Repositioned    Home Living                      Prior Function            PT Goals (current goals can now be found in the care plan section) Acute Rehab PT Goals Patient Stated Goal: to go home Potential to Achieve Goals: Good Progress towards PT goals: Progressing toward goals    Frequency    7X/week      PT Plan Current plan remains appropriate    Co-evaluation  AM-PAC PT "6 Clicks" Mobility   Outcome Measure  Help needed turning from your back to your side while in a flat bed without using bedrails?: A Little Help needed moving from lying on your back to sitting on the side of a flat bed without using bedrails?: A Little Help needed moving to and from a bed to a chair (including a wheelchair)?: A Little Help needed standing up from a chair using your arms (e.g., wheelchair or bedside chair)?: A Little Help needed to walk in hospital room?: A Little Help needed climbing 3-5 steps with a railing? : A Little 6 Click Score: 18     End of Session Equipment Utilized During Treatment: Gait belt Activity Tolerance: Patient tolerated treatment well Patient left: with call bell/phone within reach;in chair;with chair alarm set Nurse Communication: Mobility status PT Visit Diagnosis: Unsteadiness on feet (R26.81);Muscle weakness (generalized) (M62.81);Difficulty in walking, not elsewhere classified (R26.2);Pain Pain - Right/Left: Right Pain - part of body: Knee     Time: 1025-1110 PT Time Calculation (min) (ACUTE ONLY): 45 min  Charges:  $Gait Training: 8-22 mins $Therapeutic Exercise: 8-22 mins $Therapeutic Activity: 8-22 mins                     Erasmo Leventhal , PTA Acute Rehabilitation Services Pager [phone] Office [phone]     Volney Reierson Eli Hose 06/12/2020, 2:25 PM

## 2020-06-12 NOTE — Telephone Encounter (Signed)
Angie called with [facility] advised still working on trying to get someone for HHPT for the patient. Angie asked if someone will contact her as soon as possible. The number to contact Angie is [phone]

## 2020-06-12 NOTE — Progress Notes (Signed)
Physical Therapy Treatment Patient Details Name: [name] MRN: [id number] DOB: 1961/12/04 Today's Date: 06/12/2020    History of Present Illness Pt is a 59 y/o female s/p R TKA. PMH includes breast cancer, HTN, CAD, and gout.     PT Comments    Pt supine in bed on arrival.  Refused to sit in recliner so transferred back to bed post session.  Pt increased gt distance and performed ROM to R knee. Re-educated on resting in supported extension with knee straight.  Pt continues to improve.     Follow Up Recommendations  Follow surgeons recommendation for DC plan and follow-up therapies;Supervision for mobility/OOB     Equipment Recommendations  Rolling walker with 5" wheels;3in1 (PT)    Recommendations for Other Services       Precautions / Restrictions Precautions Precautions: Knee Precaution Booklet Issued: No Precaution Comments: Verbally reviewed knee precautions.  Required Braces or Orthoses: Other Brace Other Brace: L darco shoe Restrictions Weight Bearing Restrictions: Yes RLE Weight Bearing: Weight bearing as tolerated    Mobility  Bed Mobility Overal bed mobility: Needs Assistance Bed Mobility: Supine to Sit;Sit to Supine     Supine to sit: Min assist Sit to supine: Mod assist   General bed mobility comments: Min assistance to move to edge of bed and elevate trunk into a seated position.  Mod assistance to lift RLE back to bed  Transfers Overall transfer level: Needs assistance Equipment used: Rolling walker (2 wheeled) Transfers: Sit to/from Stand Sit to Stand: Min assist         General transfer comment: Cues for hand placement to and from seated surface. pt with poor safety awareness backing to seated surface leaving RW to the side and swing hips to surface.  Educated on safety.  Ambulation/Gait Ambulation/Gait assistance: Min guard Gait Distance (Feet): 50 Feet Assistive device: Rolling walker (2 wheeled) Gait Pattern/deviations: Step-to  pattern;Shuffle;Antalgic;Decreased stride length     General Gait Details: Cues for sequencing and gt symmetry.  VCs to avoid resting arms on RW.   Stairs             Wheelchair Mobility    Modified Rankin (Stroke Patients Only)       Balance Overall balance assessment: Needs assistance Sitting-balance support: No upper extremity supported;Feet supported Sitting balance-Leahy Scale: Fair       Standing balance-Leahy Scale: Poor                              Cognition Arousal/Alertness: Awake/alert Behavior During Therapy: WFL for tasks assessed/performed Overall Cognitive Status: Within Functional Limits for tasks assessed                                        Exercises General Exercises - Lower Extremity Long Arc Quad: Right;10 reps;AAROM;Seated    General Comments        Pertinent Vitals/Pain Pain Assessment: 0-10 Pain Score: 7  Pain Location: R knee Pain Descriptors / Indicators: Aching Pain Intervention(s): Monitored during session;Repositioned    Home Living                      Prior Function            PT Goals (current goals can now be found in the care plan section) Acute Rehab PT Goals Patient Stated  Goal: to go home Potential to Achieve Goals: Good Progress towards PT goals: Progressing toward goals    Frequency    7X/week      PT Plan Current plan remains appropriate    Co-evaluation              AM-PAC PT "6 Clicks" Mobility   Outcome Measure  Help needed turning from your back to your side while in a flat bed without using bedrails?: A Little Help needed moving from lying on your back to sitting on the side of a flat bed without using bedrails?: A Little Help needed moving to and from a bed to a chair (including a wheelchair)?: A Little Help needed standing up from a chair using your arms (e.g., wheelchair or bedside chair)?: A Little Help needed to walk in hospital room?: A  Little Help needed climbing 3-5 steps with a railing? : A Little 6 Click Score: 18    End of Session Equipment Utilized During Treatment: Gait belt Activity Tolerance: Patient tolerated treatment well Patient left: with call bell/phone within reach;in bed;with bed alarm set Nurse Communication: Mobility status PT Visit Diagnosis: Unsteadiness on feet (R26.81);Muscle weakness (generalized) (M62.81);Difficulty in walking, not elsewhere classified (R26.2);Pain Pain - Right/Left: Right Pain - part of body: Knee     Time: 1518-3437 PT Time Calculation (min) (ACUTE ONLY): 35 min  Charges:  $Gait Training: 8-22 mins $Therapeutic Activity: 8-22 mins                     [name] , PTA Acute Rehabilitation Services Pager [phone] Office [phone]     [name] 06/12/2020, 6:24 PM

## 2020-06-13 DIAGNOSIS — I220 Subsequent ST elevation (STEMI) myocardial infarction of anterior wall: Secondary | ICD-10-CM | POA: Diagnosis not present

## 2020-06-13 MED ORDER — METHOCARBAMOL 500 MG PO TABS: 500.0000 mg | ORAL_TABLET | Freq: Four times a day (QID) | ORAL | 0 refills | Status: DC | PRN

## 2020-06-13 MED ORDER — OXYCODONE HCL 5 MG PO TABS: 5.0000 mg | ORAL_TABLET | ORAL | 0 refills | Status: DC | PRN

## 2020-06-13 MED FILL — METHOCARBAMOL 500 MG TABS: 500 | 7 days supply | Qty: 30 | Fill #0

## 2020-06-13 MED FILL — oxyCODONE HCL 5 MG TABS: 5 | 3 days supply | Qty: 30 | Fill #0

## 2020-06-13 NOTE — Discharge Summary (Signed)
Discharge Diagnoses:  Active Problems:   Unilateral primary osteoarthritis, right knee   Arthritis of right knee   Surgeries: Procedure(s): RIGHT TOTAL KNEE ARTHROPLASTY on 06/11/2020    Consultants:   Discharged Condition: Improved  Hospital Course: [name] is an 59 y.o. female who was admitted 06/11/2020 with a chief complaint of right knee pain, with a final diagnosis of Osteoarthritis Right Knee.  Patient was brought to the operating room on 06/11/2020 and underwent Procedure(s): RIGHT TOTAL KNEE ARTHROPLASTY.    Patient was given perioperative antibiotics:  Anti-infectives (From admission, onward)   Start     Dose/Rate Route Frequency Ordered Stop   06/11/20 1700  ceFAZolin (ANCEF) IVPB 2g/100 mL premix        2 g 200 mL/hr over 30 Minutes Intravenous Every 6 hours 06/11/20 1258 06/12/20 1043   06/11/20 0745  ceFAZolin (ANCEF) IVPB 2g/100 mL premix        2 g 200 mL/hr over 30 Minutes Intravenous On call to O.R. 06/11/20 0736 06/11/20 1059   06/11/20 0741  ceFAZolin (ANCEF) 2-4 GM/100ML-% IVPB       Note to Pharmacy: Therese Sarah   : cabinet override      06/11/20 0741 06/12/20 1043    .  Patient was given sequential compression devices, early ambulation, and aspirin for DVT prophylaxis.  Recent vital signs:  Patient Vitals for the past 24 hrs:  BP Temp Temp src Pulse Resp SpO2  06/13/20 0400 126/79 98.3 F (36.8 C) Oral 81 18 93 %  06/12/20 2016 (!) 152/88 99.4 F (37.4 C) Oral 87 18 91 %  06/12/20 1329 (!) 181/96 98.7 F (37.1 C) Oral 74 16 91 %  06/12/20 0843 (!) 150/89 98.4 F (36.9 C) Oral 78 17 96 %  .  Recent laboratory studies: No results found.  Discharge Medications:   Allergies as of 06/13/2020   No Known Allergies     Medication List    STOP taking these medications   oxyCODONE-acetaminophen 5-325 MG tablet Commonly known as: Percocet     TAKE these medications   acetaminophen 500 MG tablet Commonly known as: TYLENOL Take 2  tablets (1,000 mg total) by mouth every 8 (eight) hours as needed.   allopurinol 300 MG tablet Commonly known as: ZYLOPRIM Take 1 tablet (300 mg total) by mouth daily.   amLODipine 5 MG tablet Commonly known as: NORVASC Take 1 tablet (5 mg total) by mouth daily.   ascorbic acid 500 MG tablet Commonly known as: VITAMIN C Take 500 mg by mouth daily at 10 pm.   aspirin EC 81 MG tablet Take 81 mg by mouth daily at 10 pm.   atorvastatin 40 MG tablet Commonly known as: LIPITOR Take 1 tablet (40 mg total) by mouth daily at 6 PM.   camphor-menthol lotion Commonly known as: SARNA Apply topically as needed for itching.   ELDERBERRY PO Take 1,000 mg by mouth daily at 10 pm.   Ginkgo Biloba 120 MG Tabs Take 120 mg by mouth daily at 10 pm.   isosorbide mononitrate 60 MG 24 hr tablet Commonly known as: IMDUR TAKE 1 TABLET (60 MG TOTAL) BY MOUTH AT BEDTIME.   meloxicam 15 MG tablet Commonly known as: MOBIC Take 1 tablet (15 mg total) by mouth as needed.   methadone 5 MG tablet Commonly known as: DOLOPHINE TAKE 3 TABLETS BY MOUTH EVERY 8 HOURS.   methocarbamol 500 MG tablet Commonly known as: ROBAXIN Take 1 tablet (500 mg total)  by mouth every 6 (six) hours as needed for muscle spasms.   metoprolol succinate 50 MG 24 hr tablet Commonly known as: TOPROL-XL Take 2 tablets (100 mg total) by mouth daily. Take with or immediately following a meal.   nitroGLYCERIN 0.4 MG SL tablet Commonly known as: NITROSTAT Place 1 tablet (0.4 mg total) under the tongue every 5 (five) minutes as needed for chest pain.   oxyCODONE 5 MG immediate release tablet Commonly known as: Oxy IR/ROXICODONE Take 1-2 tablets (5-10 mg total) by mouth every 4 (four) hours as needed for moderate pain (pain score 4-6).   potassium chloride SA 20 MEQ tablet Commonly known as: KLOR-CON Take 1 tablet (20 mEq total) by mouth 3 (three) times daily.   Raised Toilet Seat Misc 1 each by Does not apply route as  needed.   tamoxifen 20 MG tablet Commonly known as: NOLVADEX TAKE 1 TABLET BY MOUTH EVERY DAY   Vitamin D (Ergocalciferol) 1.25 MG (50000 UNIT) Caps capsule Commonly known as: DRISDOL TAKE 1 CAPSULE (50,000 UNITS TOTAL) BY MOUTH EVERY 7 (SEVEN) DAYS.            Durable Medical Equipment  (From admission, onward)         Start     Ordered   06/13/20 0715  For home use only DME 3 n 1  Once        06/13/20 0714   06/13/20 0714  For home use only DME Walker rolling  Once       Question Answer Comment  Walker: With 5 Inch Wheels   Patient needs a walker to treat with the following condition History of total right knee replacement      06/13/20 0714          Diagnostic Studies: No results found.  Patient benefited maximally from their hospital stay and there were no complications.     Disposition: Discharge disposition: 01-Home or Self Care      Discharge Instructions    Call MD / Call 911   Complete by: As directed    If you experience chest pain or shortness of breath, CALL 911 and be transported to the hospital emergency room.  If you develope a fever above 101 F, pus (white drainage) or increased drainage or redness at the wound, or calf pain, call your surgeon's office.   Constipation Prevention   Complete by: As directed    Drink plenty of fluids.  Prune juice may be helpful.  You may use a stool softener, such as Colace (over the counter) 100 mg twice a day.  Use MiraLax (over the counter) for constipation as needed.   Diet - low sodium heart healthy   Complete by: As directed    Do not put a pillow under the knee. Place it under the heel.   Complete by: As directed    Increase activity slowly as tolerated   Complete by: As directed    Negative Pressure Wound Therapy - Incisional   Complete by: As directed    Show patient how to attach prevena pump      Follow-up Information    Orvile Corona, Bevely Palmer, PA Follow up.   Specialty: Orthopedic Surgery Contact  information: [address] [place] [address] [phone]                Signed: Bevely Palmer Rylie Knierim 06/13/2020, 7:27 AM

## 2020-06-13 NOTE — Discharge Instructions (Signed)
INSTRUCTIONS AFTER JOINT REPLACEMENT   o Remove items at home which could result in a fall. This includes throw rugs or furniture in walking pathways o ICE to the affected joint every three hours while awake for 30 minutes at a time, for at least the first 3-5 days, and then as needed for pain and swelling.  Continue to use ice for pain and swelling. You may notice swelling that will progress down to the foot and ankle.  This is normal after surgery.  Elevate your leg when you are not up walking on it.   o Continue to use the breathing machine you got in the hospital (incentive spirometer) which will help keep your temperature down.  It is common for your temperature to cycle up and down following surgery, especially at night when you are not up moving around and exerting yourself.  The breathing machine keeps your lungs expanded and your temperature down.   DIET:  As you were doing prior to hospitalization, we recommend a well-balanced diet.  DRESSING / WOUND CARE / SHOWERING  Keep wound vac dressing in place until 1st post op visit and keep dry. Call office if vac starts beeping  ACTIVITY  o Increase activity slowly as tolerated, but follow the weight bearing instructions below.   o No driving for 6 weeks or until further direction given by your physician.  You cannot drive while taking narcotics.  o No lifting or carrying greater than 10 lbs. until further directed by your surgeon. o Avoid periods of inactivity such as sitting longer than an hour when not asleep. This helps prevent blood clots.  o You may return to work once you are authorized by your doctor.     WEIGHT BEARING   Weightbearing as tolerated with walker   EXERCISES  Results after joint replacement surgery are often greatly improved when you follow the exercise, range of motion and muscle strengthening exercises prescribed by your doctor. Safety measures are also important to protect the joint from further injury. Any  time any of these exercises cause you to have increased pain or swelling, decrease what you are doing until you are comfortable again and then slowly increase them. If you have problems or questions, call your caregiver or physical therapist for advice.   Rehabilitation is important following a joint replacement. After just a few days of immobilization, the muscles of the leg can become weakened and shrink (atrophy).  These exercises are designed to build up the tone and strength of the thigh and leg muscles and to improve motion. Often times heat used for twenty to thirty minutes before working out will loosen up your tissues and help with improving the range of motion but do not use heat for the first two weeks following surgery (sometimes heat can increase post-operative swelling).   These exercises can be done on a training (exercise) mat, on the floor, on a table or on a bed. Use whatever works the best and is most comfortable for you.    Use music or television while you are exercising so that the exercises are a pleasant break in your day. This will make your life better with the exercises acting as a break in your routine that you can look forward to.   Perform all exercises about fifteen times, three times per day or as directed.  You should exercise both the operative leg and the other leg as well.  Exercises include:   . Quad Sets - Tighten up  the muscle on the front of the thigh (Quad) and hold for 5-10 seconds.   . Straight Leg Raises - With your knee straight (if you were given a brace, keep it on), lift the leg to 60 degrees, hold for 3 seconds, and slowly lower the leg.  Perform this exercise against resistance later as your leg gets stronger.  . Leg Slides: Lying on your back, slowly slide your foot toward your buttocks, bending your knee up off the floor (only go as far as is comfortable). Then slowly slide your foot back down until your leg is flat on the floor again.  G[place]ard Haring Wings:  Lying on your back spread your legs to the side as far apart as you can without causing discomfort.  . Hamstring Strength:  Lying on your back, push your heel against the floor with your leg straight by tightening up the muscles of your buttocks.  Repeat, but this time bend your knee to a comfortable angle, and push your heel against the floor.  You may put a pillow under the heel to make it more comfortable if necessary.   A rehabilitation program following joint replacement surgery can speed recovery and prevent re-injury in the future due to weakened muscles. Contact your doctor or a physical therapist for more information on knee rehabilitation.    CONSTIPATION  Constipation is defined medically as fewer than three stools per week and severe constipation as less than one stool per week.  Even if you have a regular bowel pattern at home, your normal regimen is likely to be disrupted due to multiple reasons following surgery.  Combination of anesthesia, postoperative narcotics, change in appetite and fluid intake all can affect your bowels.   YOU MUST use at least one of the following options; they are listed in order of increasing strength to get the job done.  They are all available over the counter, and you may need to use some, POSSIBLY even all of these options:    Drink plenty of fluids (prune juice may be helpful) and high fiber foods Colace 100 mg by mouth twice a day  Senokot for constipation as directed and as needed Dulcolax (bisacodyl), take with full glass of water  Miralax (polyethylene glycol) once or twice a day as needed.  If you have tried all these things and are unable to have a bowel movement in the first 3-4 days after surgery call either your surgeon or your primary doctor.    If you experience loose stools or diarrhea, hold the medications until you stool forms back up.  If your symptoms do not get better within 1 week or if they get worse, check with your doctor.  If you  experience "the worst abdominal pain ever" or develop nausea or vomiting, please contact the office immediately for further recommendations for treatment.   ITCHING:  If you experience itching with your medications, try taking only a single pain pill, or even half a pain pill at a time.  You can also use Benadryl over the counter for itching or also to help with sleep.   TED HOSE STOCKINGS:  Use stockings on both legs until for at least 2 weeks or as directed by physician office. They may be removed at night for sleeping.  MEDICATIONS:  See your medication summary on the "After Visit Summary" that nursing will review with you.  You may have some home medications which will be placed on hold until you complete the course of  blood thinner medication.  It is important for you to complete the blood thinner medication as prescribed.  PRECAUTIONS:  If you experience chest pain or shortness of breath - call 911 immediately for transfer to the hospital emergency department.   If you develop a fever greater that 101 F, purulent drainage from wound, increased redness or drainage from wound, foul odor from the wound/dressing, or calf pain - CONTACT YOUR SURGEON.                                                   FOLLOW-UP APPOINTMENTS:  If you do not already have a post-op appointment, please call the office for an appointment to be seen by your surgeon.  Guidelines for how soon to be seen are listed in your "After Visit Summary", but are typically between 1-4 weeks after surgery.  OTHER INSTRUCTIONS:   Knee Replacement:  Do not place pillow under knee, focus on keeping the knee straight while resting. CPM instructions: 0-90 degrees, 2 hours in the morning, 2 hours in the afternoon, and 2 hours in the evening. Place foam block, curve side up under heel at all times except when in CPM or when walking.  DO NOT modify, tear, cut, or change the foam block in any way.   DENTAL ANTIBIOTICS:  In most cases  prophylactic antibiotics for Dental procdeures after total joint surgery are not necessary.  Exceptions are as follows:  1. History of prior total joint infection  2. Severely immunocompromised (Organ Transplant, cancer chemotherapy, Rheumatoid biologic meds such as [place])  3. Poorly controlled diabetes (A1C &gt; 8.0, blood glucose over 200)  If you have one of these conditions, contact your surgeon for an antibiotic prescription, prior to your dental procedure.   MAKE SURE YOU:  . Understand these instructions.  . Get help right away if you are not doing well or get worse.    Thank you for letting us be a part of your medical care team.  It is a privilege we respect greatly.  We hope these instructions will help you stay on track for a fast and full recovery!

## 2020-06-13 NOTE — Telephone Encounter (Signed)
I left voicemail for return call. 

## 2020-06-13 NOTE — TOC Transition Note (Addendum)
Transition of Care [facility]) - CM/SW Discharge Note   Patient Details  Name: SHATARA STANEK MRN: [id number] Date of Birth: Aug 08, 1961  Transition of Care [facility]) CM/SW Contact:  Sharin Mons, RN Phone Number: 06/13/2020, 1:21 PM   Clinical Narrative:    Pt admitted, s/p R TKA, vac drsg, 6/23/021. Pt will transition to home today. NCM unable to land [facility] agency. [facility] Knee, [place], [place], [facility], [facility] all declined. NCM made Bevely Palmer PA  Aware. PA stated ok for pt to still d/c. Ortho office is working on landing [employer] [facility] as well and will f/u with pt. Referral made with [employer] for DME needs ( rolling walker , 3in 1/ bsc). DME will be delivered to bedside prior to d/c.  Pt state fiance will provide transportation to home    Final next level of care: [place] (unable to land [facility] agency) Barriers to Discharge: No Barriers Identified   Patient Goals and CMS Choice     Choice offered to / list presented to : Patient  Discharge Placement                       Discharge Plan and Services                DME Arranged: 3-N-1, Walker rolling DME Agency: [facility] Date DME Agency Contacted: 06/13/20 Time DME Agency Contacted: 76   [place] Arranged: PT (unable to land [facility] agency)          Social Determinants of Health (SDOH) Interventions     Readmission Risk Interventions No flowsheet data found.

## 2020-06-13 NOTE — Telephone Encounter (Signed)
I spoke with Angie. [facility] was not able to pick up patient for HHPT and [facility] does not have staffing to cover. Angie has spoken to someone with [facility] and they will take patient. She is going to get patient's information to [facility] staff.

## 2020-06-13 NOTE — Progress Notes (Signed)
Physical Therapy Treatment Patient Details Name: [name] MRN: [id number] DOB: 08/22/1961 Today's Date: 06/13/2020    History of Present Illness Pt is a 59 y/o female s/p R TKA. PMH includes breast cancer, HTN, CAD, and gout.     PT Comments    Pt supine in bed on arrival.  Pt required assistance to move to edge of bed.  Pt continues to require encouragement.  Will f/u in pm for stair training.    Follow Up Recommendations  Follow surgeon's recommendation for DC plan and follow-up therapies;Supervision for mobility/OOB     Equipment Recommendations  Rolling walker with 5" wheels;3in1 (PT)    Recommendations for Other Services       Precautions / Restrictions Precautions Precautions: Knee Precaution Booklet Issued: No Precaution Comments: Verbally reviewed knee precautions.  Required Braces or Orthoses: Other Brace Other Brace: L darco shoe Restrictions Weight Bearing Restrictions: No RLE Weight Bearing: Weight bearing as tolerated    Mobility  Bed Mobility Overal bed mobility: Needs Assistance Bed Mobility: Supine to Sit;Sit to Supine     Supine to sit: Min assist Sit to supine: Mod assist   General bed mobility comments: Pt supine in bed on arrival.  Required encouragement to move to edge of bed.  MIn assistance to guide RLE into and out of the bed.  Transfers Overall transfer level: Needs assistance Equipment used: Rolling walker (2 wheeled) Transfers: Sit to/from Stand Sit to Stand: Min assist         General transfer comment: Cues for hand placement to and from seated surface. Continued education for safety as she has a tendency to leave RW to the side.  Ambulation/Gait                 Stairs             Wheelchair Mobility    Modified Rankin (Stroke Patients Only)       Balance Overall balance assessment: Needs assistance Sitting-balance support: No upper extremity supported;Feet supported Sitting balance-Leahy Scale:  Fair       Standing balance-Leahy Scale: Poor                              Cognition Arousal/Alertness: Awake/alert Behavior During Therapy: WFL for tasks assessed/performed Overall Cognitive Status: Within Functional Limits for tasks assessed                                        Exercises Total Joint Exercises Ankle Circles/Pumps: AROM;Both;20 reps;Supine Quad Sets: AROM;Right;10 reps;Supine Heel Slides: AROM;AAROM;Right;10 reps;Supine Hip ABduction/ADduction: AROM;AAROM;Right;10 reps;Supine Straight Leg Raises: AROM;AAROM;Right;10 reps;Supine Goniometric ROM: 12-72 degrees R knee.    General Comments        Pertinent Vitals/Pain Pain Assessment: 0-10 Faces Pain Scale: Hurts little more Pain Location: R knee Pain Descriptors / Indicators: Aching Pain Intervention(s): Monitored during session;Repositioned    Home Living                      Prior Function            PT Goals (current goals can now be found in the care plan section) Acute Rehab PT Goals Patient Stated Goal: to go home Potential to Achieve Goals: Good Progress towards PT goals: Progressing toward goals    Frequency    7X/week  PT Plan Current plan remains appropriate    Co-evaluation              AM-PAC PT "6 Clicks" Mobility   Outcome Measure  Help needed turning from your back to your side while in a flat bed without using bedrails?: A Little Help needed moving from lying on your back to sitting on the side of a flat bed without using bedrails?: A Little Help needed moving to and from a bed to a chair (including a wheelchair)?: A Little Help needed standing up from a chair using your arms (e.g., wheelchair or bedside chair)?: A Little Help needed to walk in hospital room?: A Little Help needed climbing 3-5 steps with a railing? : A Little 6 Click Score: 18    End of Session Equipment Utilized During Treatment: Gait belt Activity  Tolerance: Patient tolerated treatment well Patient left: with call bell/phone within reach;in bed;with bed alarm set Nurse Communication: Mobility status PT Visit Diagnosis: Unsteadiness on feet (R26.81);Muscle weakness (generalized) (M62.81);Difficulty in walking, not elsewhere classified (R26.2);Pain Pain - Right/Left: Right Pain - part of body: Knee     Time: 1093-2355 PT Time Calculation (min) (ACUTE ONLY): 31 min  Charges:  $Gait Training: 8-22 mins $Therapeutic Exercise: 8-22 mins                     Erasmo Leventhal , PTA Acute Rehabilitation Services Pager [phone] Office [phone]     [name] 06/13/2020, 6:14 PM

## 2020-06-13 NOTE — Progress Notes (Signed)
Patient is doing well status post knee arthroplasty.  Working with physical therapy.  Amenable to being discharged home today.  Hopefully home health PT can be arranged  Vital signs stable afebrile lying in bed comfortable.  She is requesting a walker and a commode.  Prevena wound VAC is functioning  Plan for discharge today follow-up in office in 1 week

## 2020-06-13 NOTE — Progress Notes (Signed)
Physical Therapy Treatment Patient Details Name: [name] MRN: [id number] DOB: [birth year] Today's Date: 06/13/2020    History of Present Illness Pt is a 59 y/o female s/p R TKA. PMH includes breast cancer, HTN, CAD, and gout.     PT Comments    Pt continues to move slowly and requires encouragement.  Focused on stair training this pm to prepare for return home.    Follow Up Recommendations  Follow surgeons recommendation for DC plan and follow-up therapies;Supervision for mobility/OOB     Equipment Recommendations  Rolling walker with 5" wheels;3in1 (PT)    Recommendations for Other Services       Precautions / Restrictions Precautions Precautions: Knee Precaution Booklet Issued: No Precaution Comments: Verbally reviewed knee precautions.  Required Braces or Orthoses: Other Brace Other Brace: L darco shoe Restrictions Weight Bearing Restrictions: No RLE Weight Bearing: Weight bearing as tolerated    Mobility  Bed Mobility Overal bed mobility: Needs Assistance Bed Mobility: Supine to Sit;Sit to Supine     Supine to sit: Min guard Sit to supine: Mod assist   General bed mobility comments: Pt supine in bed on arrival.  Continues to require encouragement to participate in session.  Min assistance to guide RLE into and out of the bed.  Transfers Overall transfer level: Needs assistance Equipment used: Rolling walker (2 wheeled) Transfers: Sit to/from Stand Sit to Stand: Min assist         General transfer comment: Cues for hand placement to and from seated surface. Continued education for safety as she has a tendency to leave RW to the side.  Ambulation/Gait Ambulation/Gait assistance: Min guard Gait Distance (Feet): 12 Feet Assistive device: Rolling walker (2 wheeled) Gait Pattern/deviations: Step-to pattern;Shuffle;Antalgic;Decreased stride length     General Gait Details: Cues for sequencing and gt symmetry.  VCs to avoid resting arms on  RW.   Stairs Stairs: Yes Stairs assistance: Min assist Stair Management: No rails;Forwards;Backwards Number of Stairs: 4 (curb step x2 forward entry and x2 backward entry.)     Wheelchair Mobility    Modified Rankin (Stroke Patients Only)       Balance Overall balance assessment: Needs assistance Sitting-balance support: No upper extremity supported;Feet supported Sitting balance-Leahy Scale: Fair       Standing balance-Leahy Scale: Poor                              Cognition Arousal/Alertness: Awake/alert Behavior During Therapy: WFL for tasks assessed/performed Overall Cognitive Status: Within Functional Limits for tasks assessed                                        Exercises    General Comments        Pertinent Vitals/Pain Pain Assessment: 0-10 Faces Pain Scale: Hurts little more Pain Location: R knee Pain Descriptors / Indicators: Aching Pain Intervention(s): Monitored during session;Repositioned    Home Living                      Prior Function            PT Goals (current goals can now be found in the care plan section) Acute Rehab PT Goals Patient Stated Goal: to go home Potential to Achieve Goals: Good Progress towards PT goals: Progressing toward goals    Frequency  7X/week      PT Plan Current plan remains appropriate    Co-evaluation              AM-PAC PT "6 Clicks" Mobility   Outcome Measure  Help needed turning from your back to your side while in a flat bed without using bedrails?: A Little Help needed moving from lying on your back to sitting on the side of a flat bed without using bedrails?: A Little Help needed moving to and from a bed to a chair (including a wheelchair)?: A Little Help needed standing up from a chair using your arms (e.g., wheelchair or bedside chair)?: A Little Help needed to walk in hospital room?: A Little Help needed climbing 3-5 steps with a railing?  : A Little 6 Click Score: 18    End of Session Equipment Utilized During Treatment: Gait belt Activity Tolerance: Patient tolerated treatment well Patient left: with call bell/phone within reach;in bed;with bed alarm set Nurse Communication: Mobility status PT Visit Diagnosis: Unsteadiness on feet (R26.81);Muscle weakness (generalized) (M62.81);Difficulty in walking, not elsewhere classified (R26.2);Pain Pain - Right/Left: Right Pain - part of body: Knee     Time: 2957-4734 PT Time Calculation (min) (ACUTE ONLY): 25 min  Charges:  $Gait Training: 8-22 mins  $Therapeutic Activity: 8-22 mins                     Erasmo Leventhal , PTA Acute Rehabilitation Services Pager [phone] Office [phone]     Christy Ehrsam Eli Hose 06/13/2020, 6:21 PM

## 2020-06-14 DIAGNOSIS — I220 Subsequent ST elevation (STEMI) myocardial infarction of anterior wall: Secondary | ICD-10-CM | POA: Diagnosis not present

## 2020-06-15 DIAGNOSIS — I220 Subsequent ST elevation (STEMI) myocardial infarction of anterior wall: Secondary | ICD-10-CM | POA: Diagnosis not present

## 2020-06-16 ENCOUNTER — Other Ambulatory Visit: Payer: Self-pay | Admitting: Family Medicine

## 2020-06-16 ENCOUNTER — Telehealth: Payer: Self-pay | Admitting: Orthopedic Surgery

## 2020-06-16 DIAGNOSIS — I220 Subsequent ST elevation (STEMI) myocardial infarction of anterior wall: Secondary | ICD-10-CM | POA: Diagnosis not present

## 2020-06-16 NOTE — Telephone Encounter (Signed)
Angie from [facility] called.   Requesting a call back to discuss orders on the patient.  Call back: [phone]

## 2020-06-16 NOTE — Telephone Encounter (Signed)
Angie called back to let us know that [place] is taking care of HHPT and will start today.

## 2020-06-16 NOTE — Telephone Encounter (Signed)
I left voicemail for Angie to return call.

## 2020-06-17 ENCOUNTER — Telehealth: Payer: Self-pay | Admitting: Orthopedic Surgery

## 2020-06-17 DIAGNOSIS — I220 Subsequent ST elevation (STEMI) myocardial infarction of anterior wall: Secondary | ICD-10-CM | POA: Diagnosis not present

## 2020-06-17 NOTE — Telephone Encounter (Signed)
Thank you, noted.

## 2020-06-17 NOTE — Telephone Encounter (Signed)
Patient called asked for a call back. Patient want to know if she can lay on her side?  Patient asked when will the therapist come back to her home? The number to contact patient is [phone]

## 2020-06-17 NOTE — Telephone Encounter (Signed)
[place] with home Health called to place a PT verbal order with the frequency of twice a week for 1 week.  [place] CB# [phone]

## 2020-06-17 NOTE — Telephone Encounter (Signed)
Pt s/p right total knee 06/11/20 called and sw Darnelle with [facility] verbal ok for PT orders as requested.

## 2020-06-17 NOTE — Telephone Encounter (Signed)
I called pt and advised that yes she can lay on her side but that she should place a pillow between her knees. Voiced understanding and will call with any questions.

## 2020-06-18 ENCOUNTER — Inpatient Hospital Stay: Payer: Medicaid Other | Admitting: Family

## 2020-06-18 DIAGNOSIS — I220 Subsequent ST elevation (STEMI) myocardial infarction of anterior wall: Secondary | ICD-10-CM | POA: Diagnosis not present

## 2020-06-19 DIAGNOSIS — I220 Subsequent ST elevation (STEMI) myocardial infarction of anterior wall: Secondary | ICD-10-CM | POA: Diagnosis not present

## 2020-06-20 ENCOUNTER — Ambulatory Visit (INDEPENDENT_AMBULATORY_CARE_PROVIDER_SITE_OTHER): Payer: Medicaid Other | Admitting: Physician Assistant

## 2020-06-20 ENCOUNTER — Other Ambulatory Visit: Payer: Self-pay

## 2020-06-20 ENCOUNTER — Other Ambulatory Visit: Payer: Self-pay | Admitting: Adult Health

## 2020-06-20 ENCOUNTER — Encounter: Payer: Self-pay | Admitting: Physician Assistant

## 2020-06-20 VITALS — Ht 65.0 in | Wt 235.0 lb

## 2020-06-20 DIAGNOSIS — M1711 Unilateral primary osteoarthritis, right knee: Secondary | ICD-10-CM

## 2020-06-20 DIAGNOSIS — Z853 Personal history of malignant neoplasm of breast: Secondary | ICD-10-CM

## 2020-06-20 DIAGNOSIS — I220 Subsequent ST elevation (STEMI) myocardial infarction of anterior wall: Secondary | ICD-10-CM | POA: Diagnosis not present

## 2020-06-20 DIAGNOSIS — M792 Neuralgia and neuritis, unspecified: Secondary | ICD-10-CM

## 2020-06-20 DIAGNOSIS — G8929 Other chronic pain: Secondary | ICD-10-CM

## 2020-06-20 DIAGNOSIS — I10 Essential (primary) hypertension: Secondary | ICD-10-CM

## 2020-06-20 MED ORDER — METHADONE HCL 5 MG PO TABS: ORAL_TABLET | ORAL | 0 refills | Status: DC

## 2020-06-20 MED ORDER — OXYCODONE HCL 5 MG PO TABS: 5.0000 mg | ORAL_TABLET | ORAL | 0 refills | Status: DC | PRN

## 2020-06-20 MED FILL — oxyCODONE HCL 5 MG TABS: 5 | 3 days supply | Qty: 30 | Fill #0

## 2020-06-20 NOTE — Progress Notes (Signed)
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  She is also receiving oxycodone from ortho as she has recently had a knee replacement.  PMP aware reviewed.  No red flags noted.    Wilber Bihari, NP
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
## 2020-06-21 DIAGNOSIS — I220 Subsequent ST elevation (STEMI) myocardial infarction of anterior wall: Secondary | ICD-10-CM | POA: Diagnosis not present

## 2020-06-22 DIAGNOSIS — I220 Subsequent ST elevation (STEMI) myocardial infarction of anterior wall: Secondary | ICD-10-CM | POA: Diagnosis not present

## 2020-06-23 DIAGNOSIS — I220 Subsequent ST elevation (STEMI) myocardial infarction of anterior wall: Secondary | ICD-10-CM | POA: Diagnosis not present

## 2020-06-24 ENCOUNTER — Telehealth: Payer: Self-pay

## 2020-06-24 DIAGNOSIS — I220 Subsequent ST elevation (STEMI) myocardial infarction of anterior wall: Secondary | ICD-10-CM | POA: Diagnosis not present

## 2020-06-24 MED FILL — METHADONE HCL 5 MG TABS: 5 | 30 days supply | Qty: 270 | Fill #0

## 2020-06-24 NOTE — Telephone Encounter (Signed)
Patient called in saying dr duda signed for her to receive home PT. Says nobody has contacted her yet and she has signed her paperwork.

## 2020-06-25 ENCOUNTER — Other Ambulatory Visit: Payer: Self-pay

## 2020-06-25 ENCOUNTER — Telehealth: Payer: Self-pay | Admitting: *Deleted

## 2020-06-25 DIAGNOSIS — I220 Subsequent ST elevation (STEMI) myocardial infarction of anterior wall: Secondary | ICD-10-CM | POA: Diagnosis not present

## 2020-06-25 NOTE — Telephone Encounter (Signed)
For documentation - refill for methadone electronically sent on 7/2 to pt's pharmacy- notified by pt same day that " prescription needs override by your office before they will fill it "  This RN contact [facility] and was informed due to " new medicaid by laws- any narcotic with great dispense then 7 days has to get approved thru medicaid "  This RN on 06/20/2020 sent above to PA nurse.  This RN informed pt of above- including authorization is not per this office but per her medicaid benefits.  This RN verified with PA nurse request received- and is in process.  On 06/24/2020- pt left several messages stating need of medication authorization with last message stating " I don't understand why this is not getting authorized by your office- it's against the law not to give me these medications."  This RN returned call to the patient and discussed that it is not our office that gives the authorization but her medicaid benefit with new rules starting 06/19/2020.  This RN also inquired if pt picked up at least 7 days worth to hold her over while authorization is in process.  She stated no- but verified she was informed she could.  This RN advised pt to pick up the 7 days of medication - she asked how many pills she would get - with this RN informing her prescription is written that she takes a total of 9 tablets a day and she would get 63.  Pt will proceed with above.  This RN called to [facility] phx and informed them of above- they will fill 7 days of methadone.  Note they will need a new prescription when override is obtained for additional fill.  This note will be forwarded to MD for communication.  New prescription will be sent when informed of override approval.

## 2020-06-25 NOTE — Telephone Encounter (Signed)
I sent Demo sheet with insurance to [facility] to see if they could go out and perform PT for patient. Awaiting on confirmation.

## 2020-06-25 NOTE — Telephone Encounter (Signed)
[facility] stated that they were not able to take on any in home cases at this time and patient would prefer in house. Contacted [facility] to seek PT in home for patient. Awaiting on response.

## 2020-06-25 NOTE — Telephone Encounter (Signed)
Patient was called and informed that we will send out a referral to [facility] for physical therapy and someone form their office should contact her. Patient understood.

## 2020-06-26 DIAGNOSIS — I220 Subsequent ST elevation (STEMI) myocardial infarction of anterior wall: Secondary | ICD-10-CM | POA: Diagnosis not present

## 2020-06-26 NOTE — Telephone Encounter (Signed)
Patient was called and stated that she did have physical therapy to come out to her home but only filled out paperwork and haven't heard from them since. She called and they stated to her that they were awaiting confirmation on her insurance. [facility] number is [phone] and will call to get an update.

## 2020-06-27 ENCOUNTER — Other Ambulatory Visit: Payer: Self-pay | Admitting: Orthopedic Surgery

## 2020-06-27 ENCOUNTER — Telehealth: Payer: Self-pay | Admitting: Orthopedic Surgery

## 2020-06-27 ENCOUNTER — Ambulatory Visit: Payer: Medicaid Other | Admitting: Physician Assistant

## 2020-06-27 DIAGNOSIS — I220 Subsequent ST elevation (STEMI) myocardial infarction of anterior wall: Secondary | ICD-10-CM | POA: Diagnosis not present

## 2020-06-27 MED ORDER — OXYCODONE HCL 5 MG PO TABS: 5.0000 mg | ORAL_TABLET | Freq: Four times a day (QID) | ORAL | 0 refills | Status: DC | PRN

## 2020-06-27 NOTE — Telephone Encounter (Signed)
Pt called stating she isn't feeling well so she rescheduled her appt but wanted to know if she could get a refill on her oxycodone.    [phone]

## 2020-06-27 NOTE — Telephone Encounter (Signed)
Please advise 

## 2020-06-27 NOTE — Telephone Encounter (Signed)
[facility] was called and I spoke to [place] and she stated that patient has Medicaid and they were awaiting for approval for physical therapy services at this time. I will call patient and give her this update and it is a possibility that Medicaid could deny her services, they also may allow up to four visits depending on patient's circumstances.

## 2020-06-27 NOTE — Telephone Encounter (Signed)
Rx sent for percocet.

## 2020-06-27 NOTE — Telephone Encounter (Signed)
Patient notified

## 2020-06-28 DIAGNOSIS — I220 Subsequent ST elevation (STEMI) myocardial infarction of anterior wall: Secondary | ICD-10-CM | POA: Diagnosis not present

## 2020-06-29 DIAGNOSIS — I220 Subsequent ST elevation (STEMI) myocardial infarction of anterior wall: Secondary | ICD-10-CM | POA: Diagnosis not present

## 2020-06-30 ENCOUNTER — Ambulatory Visit: Payer: Medicaid Other | Admitting: Physician Assistant

## 2020-06-30 DIAGNOSIS — I220 Subsequent ST elevation (STEMI) myocardial infarction of anterior wall: Secondary | ICD-10-CM | POA: Diagnosis not present

## 2020-07-01 ENCOUNTER — Ambulatory Visit: Payer: Medicaid Other | Admitting: Physician Assistant

## 2020-07-01 DIAGNOSIS — I220 Subsequent ST elevation (STEMI) myocardial infarction of anterior wall: Secondary | ICD-10-CM | POA: Diagnosis not present

## 2020-07-02 DIAGNOSIS — I220 Subsequent ST elevation (STEMI) myocardial infarction of anterior wall: Secondary | ICD-10-CM | POA: Diagnosis not present

## 2020-07-03 ENCOUNTER — Ambulatory Visit: Payer: Medicaid Other | Admitting: Orthopedic Surgery

## 2020-07-03 DIAGNOSIS — I220 Subsequent ST elevation (STEMI) myocardial infarction of anterior wall: Secondary | ICD-10-CM | POA: Diagnosis not present

## 2020-07-04 ENCOUNTER — Telehealth: Payer: Self-pay | Admitting: Orthopedic Surgery

## 2020-07-04 DIAGNOSIS — I220 Subsequent ST elevation (STEMI) myocardial infarction of anterior wall: Secondary | ICD-10-CM | POA: Diagnosis not present

## 2020-07-04 NOTE — Telephone Encounter (Signed)
Pt called wanting to know if antibiotics could be called in due to her incision spot feeling warm and her oxycodone called in due to her pain levels being high.
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
## 2020-07-04 NOTE — Telephone Encounter (Signed)
Patient called asked if she can get a Rx for an antibiotic and Oxycodone called into [facility] on [address]? The number to contact patient is [phone]

## 2020-07-04 NOTE — Telephone Encounter (Signed)
She needs to come in to be seen so we can determine what  the issue is. If she cannot come into the office she needs to go to an ER if there is a concern for infection

## 2020-07-04 NOTE — Telephone Encounter (Signed)
Please advise. Patient has cancelled 4 appointments and is rescheduled to come in on 07/08/20. Has not been seen since 06/20/20 for RTKA surgery 06/11/20.

## 2020-07-04 NOTE — Telephone Encounter (Signed)
Message has already been forwarded to physician. Patient has cancelled several appointments and is on the schedule again for 07/08/20. Dr Sharol Given will have to see this patient in the office.

## 2020-07-05 DIAGNOSIS — I220 Subsequent ST elevation (STEMI) myocardial infarction of anterior wall: Secondary | ICD-10-CM | POA: Diagnosis not present

## 2020-07-06 DIAGNOSIS — I220 Subsequent ST elevation (STEMI) myocardial infarction of anterior wall: Secondary | ICD-10-CM | POA: Diagnosis not present

## 2020-07-07 DIAGNOSIS — I220 Subsequent ST elevation (STEMI) myocardial infarction of anterior wall: Secondary | ICD-10-CM | POA: Diagnosis not present

## 2020-07-08 ENCOUNTER — Encounter: Payer: Self-pay | Admitting: Physician Assistant

## 2020-07-08 ENCOUNTER — Ambulatory Visit (INDEPENDENT_AMBULATORY_CARE_PROVIDER_SITE_OTHER): Payer: Medicaid Other

## 2020-07-08 ENCOUNTER — Other Ambulatory Visit: Payer: Self-pay

## 2020-07-08 ENCOUNTER — Ambulatory Visit (INDEPENDENT_AMBULATORY_CARE_PROVIDER_SITE_OTHER): Payer: Medicaid Other | Admitting: Physician Assistant

## 2020-07-08 VITALS — Ht 65.0 in | Wt 235.0 lb

## 2020-07-08 DIAGNOSIS — M1711 Unilateral primary osteoarthritis, right knee: Secondary | ICD-10-CM

## 2020-07-08 DIAGNOSIS — I220 Subsequent ST elevation (STEMI) myocardial infarction of anterior wall: Secondary | ICD-10-CM | POA: Diagnosis not present

## 2020-07-08 MED ORDER — OXYCODONE HCL 5 MG PO TABS: 5.0000 mg | ORAL_TABLET | Freq: Four times a day (QID) | ORAL | 0 refills | Status: DC | PRN

## 2020-07-08 MED FILL — oxyCODONE HCL 5 MG TABS: 5 | 5 days supply | Qty: 30 | Fill #0
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
## 2020-07-09 DIAGNOSIS — I220 Subsequent ST elevation (STEMI) myocardial infarction of anterior wall: Secondary | ICD-10-CM | POA: Diagnosis not present

## 2020-07-10 DIAGNOSIS — I220 Subsequent ST elevation (STEMI) myocardial infarction of anterior wall: Secondary | ICD-10-CM | POA: Diagnosis not present

## 2020-07-11 DIAGNOSIS — I220 Subsequent ST elevation (STEMI) myocardial infarction of anterior wall: Secondary | ICD-10-CM | POA: Diagnosis not present

## 2020-07-12 DIAGNOSIS — I220 Subsequent ST elevation (STEMI) myocardial infarction of anterior wall: Secondary | ICD-10-CM | POA: Diagnosis not present

## 2020-07-13 DIAGNOSIS — I220 Subsequent ST elevation (STEMI) myocardial infarction of anterior wall: Secondary | ICD-10-CM | POA: Diagnosis not present

## 2020-07-14 DIAGNOSIS — I220 Subsequent ST elevation (STEMI) myocardial infarction of anterior wall: Secondary | ICD-10-CM | POA: Diagnosis not present

## 2020-07-15 ENCOUNTER — Telehealth: Payer: Self-pay | Admitting: Orthopedic Surgery

## 2020-07-15 ENCOUNTER — Other Ambulatory Visit: Payer: Self-pay | Admitting: Physician Assistant

## 2020-07-15 ENCOUNTER — Telehealth: Payer: Self-pay

## 2020-07-15 ENCOUNTER — Other Ambulatory Visit: Payer: Self-pay

## 2020-07-15 DIAGNOSIS — I220 Subsequent ST elevation (STEMI) myocardial infarction of anterior wall: Secondary | ICD-10-CM | POA: Diagnosis not present

## 2020-07-15 DIAGNOSIS — M1711 Unilateral primary osteoarthritis, right knee: Secondary | ICD-10-CM

## 2020-07-15 MED ORDER — OXYCODONE HCL 5 MG PO TABS: 5.0000 mg | ORAL_TABLET | Freq: Four times a day (QID) | ORAL | 0 refills | Status: DC | PRN

## 2020-07-15 NOTE — Telephone Encounter (Signed)
Have refilled but changed rx to 5 mg every 6 hours rather than 5-10 mg every 6 hours

## 2020-07-15 NOTE — Telephone Encounter (Signed)
Rx has been faxed to pharm. Pt advised. Also pt states that [facility] has never come out to begin her therapy and she wanted to know if she could do out patient therapy. I advised I would put order in chart to see if we can get this set up for her. To call with any questions.

## 2020-07-15 NOTE — Telephone Encounter (Signed)
Patient called. Repeat message  She is still waiting on call back from [facility]

## 2020-07-15 NOTE — Telephone Encounter (Signed)
Pt is s/p a right total knee on 06/11/20. Requesting refill on pain medication last refill was 07/08/20 # 30 oxycodone. Please advise.

## 2020-07-15 NOTE — Telephone Encounter (Signed)
Patient called.   She is requesting a refill on her Oxycodone [facility] [address]. She also wants a call back from [facility] to discuss physical therapy.   Call back: [phone]

## 2020-07-16 ENCOUNTER — Other Ambulatory Visit: Payer: Self-pay

## 2020-07-16 DIAGNOSIS — I220 Subsequent ST elevation (STEMI) myocardial infarction of anterior wall: Secondary | ICD-10-CM | POA: Diagnosis not present

## 2020-07-16 MED ORDER — ALLOPURINOL 300 MG PO TABS: 300.0000 mg | ORAL_TABLET | Freq: Every day | ORAL | 2 refills | Status: DC

## 2020-07-17 ENCOUNTER — Ambulatory Visit: Payer: No Typology Code available for payment source | Admitting: Physical Therapy

## 2020-07-17 DIAGNOSIS — I220 Subsequent ST elevation (STEMI) myocardial infarction of anterior wall: Secondary | ICD-10-CM | POA: Diagnosis not present

## 2020-07-18 DIAGNOSIS — I220 Subsequent ST elevation (STEMI) myocardial infarction of anterior wall: Secondary | ICD-10-CM | POA: Diagnosis not present

## 2020-07-19 DIAGNOSIS — I220 Subsequent ST elevation (STEMI) myocardial infarction of anterior wall: Secondary | ICD-10-CM | POA: Diagnosis not present

## 2020-07-20 DIAGNOSIS — I220 Subsequent ST elevation (STEMI) myocardial infarction of anterior wall: Secondary | ICD-10-CM | POA: Diagnosis not present

## 2020-07-21 DIAGNOSIS — I220 Subsequent ST elevation (STEMI) myocardial infarction of anterior wall: Secondary | ICD-10-CM | POA: Diagnosis not present

## 2020-07-22 ENCOUNTER — Other Ambulatory Visit: Payer: Self-pay | Admitting: Family Medicine

## 2020-07-22 ENCOUNTER — Telehealth: Payer: Self-pay

## 2020-07-22 ENCOUNTER — Telehealth: Payer: Self-pay | Admitting: Orthopedic Surgery

## 2020-07-22 ENCOUNTER — Other Ambulatory Visit: Payer: Self-pay | Admitting: *Deleted

## 2020-07-22 ENCOUNTER — Encounter: Payer: Self-pay | Admitting: Physician Assistant

## 2020-07-22 ENCOUNTER — Ambulatory Visit (INDEPENDENT_AMBULATORY_CARE_PROVIDER_SITE_OTHER): Payer: Medicaid Other | Admitting: Physician Assistant

## 2020-07-22 VITALS — Ht 65.0 in | Wt 235.0 lb

## 2020-07-22 DIAGNOSIS — M1711 Unilateral primary osteoarthritis, right knee: Secondary | ICD-10-CM

## 2020-07-22 DIAGNOSIS — I220 Subsequent ST elevation (STEMI) myocardial infarction of anterior wall: Secondary | ICD-10-CM | POA: Diagnosis not present

## 2020-07-22 DIAGNOSIS — I890 Lymphedema, not elsewhere classified: Secondary | ICD-10-CM

## 2020-07-22 DIAGNOSIS — C50412 Malignant neoplasm of upper-outer quadrant of left female breast: Secondary | ICD-10-CM

## 2020-07-22 MED ORDER — OXYCODONE-ACETAMINOPHEN 5-325 MG PO TABS: 1.0000 | ORAL_TABLET | ORAL | 0 refills | Status: DC | PRN

## 2020-07-22 NOTE — Telephone Encounter (Signed)
[name] addressed with pt.
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
## 2020-07-22 NOTE — Telephone Encounter (Signed)
E 

## 2020-07-22 NOTE — Telephone Encounter (Signed)
Patient called in wanting her medications sent to [facility] outpatient

## 2020-07-22 NOTE — Telephone Encounter (Signed)
e

## 2020-07-22 NOTE — Telephone Encounter (Signed)
Patient just called back says she would [facility] [address] [facility] ........Marland Kitchen

## 2020-07-22 NOTE — Telephone Encounter (Signed)
Can pain medication be sent to [facility]

## 2020-07-22 NOTE — Telephone Encounter (Signed)
done

## 2020-07-23 ENCOUNTER — Other Ambulatory Visit: Payer: Self-pay | Admitting: Adult Health

## 2020-07-23 ENCOUNTER — Other Ambulatory Visit: Payer: Self-pay | Admitting: *Deleted

## 2020-07-23 DIAGNOSIS — Z853 Personal history of malignant neoplasm of breast: Secondary | ICD-10-CM

## 2020-07-23 DIAGNOSIS — I220 Subsequent ST elevation (STEMI) myocardial infarction of anterior wall: Secondary | ICD-10-CM | POA: Diagnosis not present

## 2020-07-23 DIAGNOSIS — M792 Neuralgia and neuritis, unspecified: Secondary | ICD-10-CM

## 2020-07-23 DIAGNOSIS — G8929 Other chronic pain: Secondary | ICD-10-CM

## 2020-07-23 DIAGNOSIS — I10 Essential (primary) hypertension: Secondary | ICD-10-CM

## 2020-07-24 ENCOUNTER — Other Ambulatory Visit: Payer: Self-pay

## 2020-07-24 ENCOUNTER — Other Ambulatory Visit: Payer: Self-pay | Admitting: Oncology

## 2020-07-24 ENCOUNTER — Ambulatory Visit: Payer: Medicaid Other | Attending: Orthopedic Surgery

## 2020-07-24 DIAGNOSIS — R262 Difficulty in walking, not elsewhere classified: Secondary | ICD-10-CM | POA: Diagnosis present

## 2020-07-24 DIAGNOSIS — M25461 Effusion, right knee: Secondary | ICD-10-CM | POA: Diagnosis present

## 2020-07-24 DIAGNOSIS — I10 Essential (primary) hypertension: Secondary | ICD-10-CM

## 2020-07-24 DIAGNOSIS — M792 Neuralgia and neuritis, unspecified: Secondary | ICD-10-CM

## 2020-07-24 DIAGNOSIS — I890 Lymphedema, not elsewhere classified: Secondary | ICD-10-CM | POA: Diagnosis present

## 2020-07-24 DIAGNOSIS — M25561 Pain in right knee: Secondary | ICD-10-CM

## 2020-07-24 DIAGNOSIS — Z853 Personal history of malignant neoplasm of breast: Secondary | ICD-10-CM

## 2020-07-24 DIAGNOSIS — M25661 Stiffness of right knee, not elsewhere classified: Secondary | ICD-10-CM

## 2020-07-24 DIAGNOSIS — G8929 Other chronic pain: Secondary | ICD-10-CM

## 2020-07-24 DIAGNOSIS — M6281 Muscle weakness (generalized): Secondary | ICD-10-CM

## 2020-07-24 DIAGNOSIS — I220 Subsequent ST elevation (STEMI) myocardial infarction of anterior wall: Secondary | ICD-10-CM | POA: Diagnosis not present

## 2020-07-24 MED ORDER — METHADONE HCL 5 MG PO TABS: ORAL_TABLET | ORAL | 0 refills | Status: DC

## 2020-07-24 MED FILL — METHADONE HCL 5 MG TABS: 5 | 30 days supply | Qty: 270 | Fill #0
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
## 2020-07-24 NOTE — Patient Instructions (Signed)
LAQ   10 reps 5 sec hold RT 2x/day                                                                                                                                                                                                                     seated knee flexion and knee extension sustained stretch  5-30 sec 3 -10 reps 2x/day Bridge  X 10 2x/day  bilat hole 3-5 sec Side lye hip abduction x10 2x/day    R  SLR RT x 10 2x/day 1-2 sec hold.

## 2020-07-25 DIAGNOSIS — I220 Subsequent ST elevation (STEMI) myocardial infarction of anterior wall: Secondary | ICD-10-CM | POA: Diagnosis not present

## 2020-07-26 DIAGNOSIS — I220 Subsequent ST elevation (STEMI) myocardial infarction of anterior wall: Secondary | ICD-10-CM | POA: Diagnosis not present

## 2020-07-27 DIAGNOSIS — I220 Subsequent ST elevation (STEMI) myocardial infarction of anterior wall: Secondary | ICD-10-CM | POA: Diagnosis not present

## 2020-07-28 ENCOUNTER — Ambulatory Visit: Payer: Medicaid Other | Admitting: Podiatry

## 2020-07-28 DIAGNOSIS — I220 Subsequent ST elevation (STEMI) myocardial infarction of anterior wall: Secondary | ICD-10-CM | POA: Diagnosis not present

## 2020-07-29 ENCOUNTER — Telehealth: Payer: Self-pay | Admitting: Orthopedic Surgery

## 2020-07-29 ENCOUNTER — Other Ambulatory Visit: Payer: Self-pay | Admitting: Physician Assistant

## 2020-07-29 DIAGNOSIS — I220 Subsequent ST elevation (STEMI) myocardial infarction of anterior wall: Secondary | ICD-10-CM | POA: Diagnosis not present

## 2020-07-29 MED ORDER — OXYCODONE-ACETAMINOPHEN 5-325 MG PO TABS: 1.0000 | ORAL_TABLET | Freq: Four times a day (QID) | ORAL | 0 refills | Status: DC | PRN

## 2020-07-29 NOTE — Telephone Encounter (Signed)
Called pt and advised that rx has been sent to pharm also made an appt for follow up per pt request for 08/26/20 at 1:15

## 2020-07-29 NOTE — Telephone Encounter (Signed)
This pt is s/p a total knee replacement 06/11/20. We have filled her Oxycodone 5/325 # 30 on 07/22/20 then we also wrote rx for Oxycodone 5 mg # 30 on ,07/15/20,07/08/20, 06/27/20, 06/20/20 and 06/13/20. She does get Methadone and did receive Methadone 5 mg # 270 on 07/24/20 just wanted to make you aware and advise how to move forward.

## 2020-07-29 NOTE — Telephone Encounter (Signed)
Patient called.  She is requesting that a refill of her oxycodone be sent in to the [facility] pharmacy on [address].  Call back: [phone]

## 2020-07-29 NOTE — Telephone Encounter (Signed)
I dont see that we have filled it. She is on methadone so whoever gives her that is going to have to do the oxycodone

## 2020-07-29 NOTE — Telephone Encounter (Signed)
Thanks couldn't find it will fill

## 2020-07-29 NOTE — Telephone Encounter (Signed)
Please advise, thank you.

## 2020-07-30 DIAGNOSIS — I220 Subsequent ST elevation (STEMI) myocardial infarction of anterior wall: Secondary | ICD-10-CM | POA: Diagnosis not present

## 2020-07-31 DIAGNOSIS — I220 Subsequent ST elevation (STEMI) myocardial infarction of anterior wall: Secondary | ICD-10-CM | POA: Diagnosis not present

## 2020-08-01 DIAGNOSIS — I220 Subsequent ST elevation (STEMI) myocardial infarction of anterior wall: Secondary | ICD-10-CM | POA: Diagnosis not present

## 2020-08-02 DIAGNOSIS — I220 Subsequent ST elevation (STEMI) myocardial infarction of anterior wall: Secondary | ICD-10-CM | POA: Diagnosis not present

## 2020-08-03 DIAGNOSIS — I220 Subsequent ST elevation (STEMI) myocardial infarction of anterior wall: Secondary | ICD-10-CM | POA: Diagnosis not present

## 2020-08-04 DIAGNOSIS — I220 Subsequent ST elevation (STEMI) myocardial infarction of anterior wall: Secondary | ICD-10-CM | POA: Diagnosis not present

## 2020-08-05 ENCOUNTER — Other Ambulatory Visit: Payer: Self-pay | Admitting: Oncology

## 2020-08-05 ENCOUNTER — Ambulatory Visit: Payer: Medicaid Other

## 2020-08-05 ENCOUNTER — Telehealth: Payer: Self-pay | Admitting: Physician Assistant

## 2020-08-05 ENCOUNTER — Other Ambulatory Visit: Payer: Self-pay | Admitting: Physician Assistant

## 2020-08-05 DIAGNOSIS — I220 Subsequent ST elevation (STEMI) myocardial infarction of anterior wall: Secondary | ICD-10-CM | POA: Diagnosis not present

## 2020-08-05 MED ORDER — OXYCODONE-ACETAMINOPHEN 5-325 MG PO TABS: 1.0000 | ORAL_TABLET | Freq: Three times a day (TID) | ORAL | 0 refills | Status: DC | PRN

## 2020-08-05 NOTE — Telephone Encounter (Signed)
Please advise, you sent her last Rx 07/29/20. This pt is s/p a total knee replacement 06/11/20. We have filled her Oxycodone 5/325 # 30 on 07/22/20 then we also wrote rx for Oxycodone 5 mg # 30 on ,07/15/20,07/08/20, 06/27/20, 06/20/20 and 06/13/20. She does get Methadone and did receive Methadone 5 mg # 270 on 07/24/20.

## 2020-08-05 NOTE — Telephone Encounter (Signed)
Refilled but have decreased frequency to every 8 hours as needed

## 2020-08-05 NOTE — Telephone Encounter (Signed)
Patient called requesting refill of oxycodone. [facility] to [facility] on [address]. Patient phone number is [phone].

## 2020-08-05 NOTE — Telephone Encounter (Signed)
Rx was refilled

## 2020-08-05 NOTE — Telephone Encounter (Signed)
Pt would like a refill of her oxycodone called in and a CB when that's done.  [phone]

## 2020-08-05 NOTE — Telephone Encounter (Signed)
Patient was called and notified.

## 2020-08-06 DIAGNOSIS — I220 Subsequent ST elevation (STEMI) myocardial infarction of anterior wall: Secondary | ICD-10-CM | POA: Diagnosis not present

## 2020-08-07 ENCOUNTER — Ambulatory Visit: Payer: Medicaid Other | Admitting: Physical Therapy

## 2020-08-07 DIAGNOSIS — I220 Subsequent ST elevation (STEMI) myocardial infarction of anterior wall: Secondary | ICD-10-CM | POA: Diagnosis not present

## 2020-08-08 DIAGNOSIS — I220 Subsequent ST elevation (STEMI) myocardial infarction of anterior wall: Secondary | ICD-10-CM | POA: Diagnosis not present

## 2020-08-09 DIAGNOSIS — I220 Subsequent ST elevation (STEMI) myocardial infarction of anterior wall: Secondary | ICD-10-CM | POA: Diagnosis not present

## 2020-08-10 DIAGNOSIS — I220 Subsequent ST elevation (STEMI) myocardial infarction of anterior wall: Secondary | ICD-10-CM | POA: Diagnosis not present

## 2020-08-11 ENCOUNTER — Encounter (HOSPITAL_COMMUNITY): Payer: Self-pay

## 2020-08-11 ENCOUNTER — Emergency Department (HOSPITAL_COMMUNITY)
Admission: EM | Admit: 2020-08-11 | Discharge: 2020-08-11 | Disposition: A | Discharge status: Home / Self Care | Payer: Medicaid Other | Attending: Emergency Medicine | Admitting: Emergency Medicine

## 2020-08-11 ENCOUNTER — Other Ambulatory Visit: Payer: Self-pay

## 2020-08-11 ENCOUNTER — Emergency Department (HOSPITAL_COMMUNITY): Payer: Medicaid Other

## 2020-08-11 DIAGNOSIS — Y929 Unspecified place or not applicable: Secondary | ICD-10-CM | POA: Diagnosis not present

## 2020-08-11 DIAGNOSIS — S40922A Unspecified superficial injury of left upper arm, initial encounter: Secondary | ICD-10-CM | POA: Insufficient documentation

## 2020-08-11 DIAGNOSIS — I2510 Atherosclerotic heart disease of native coronary artery without angina pectoris: Secondary | ICD-10-CM | POA: Insufficient documentation

## 2020-08-11 DIAGNOSIS — Y998 Other external cause status: Secondary | ICD-10-CM | POA: Diagnosis not present

## 2020-08-11 DIAGNOSIS — I10 Essential (primary) hypertension: Secondary | ICD-10-CM | POA: Insufficient documentation

## 2020-08-11 DIAGNOSIS — Z96651 Presence of right artificial knee joint: Secondary | ICD-10-CM | POA: Insufficient documentation

## 2020-08-11 DIAGNOSIS — Z79899 Other long term (current) drug therapy: Secondary | ICD-10-CM | POA: Insufficient documentation

## 2020-08-11 DIAGNOSIS — M79602 Pain in left arm: Secondary | ICD-10-CM

## 2020-08-11 DIAGNOSIS — Y9389 Activity, other specified: Secondary | ICD-10-CM | POA: Diagnosis not present

## 2020-08-11 DIAGNOSIS — Z7982 Long term (current) use of aspirin: Secondary | ICD-10-CM | POA: Diagnosis not present

## 2020-08-11 DIAGNOSIS — F17210 Nicotine dependence, cigarettes, uncomplicated: Secondary | ICD-10-CM | POA: Insufficient documentation

## 2020-08-11 DIAGNOSIS — M25522 Pain in left elbow: Secondary | ICD-10-CM | POA: Diagnosis not present

## 2020-08-11 DIAGNOSIS — I220 Subsequent ST elevation (STEMI) myocardial infarction of anterior wall: Secondary | ICD-10-CM | POA: Diagnosis not present

## 2020-08-11 NOTE — Progress Notes (Signed)
Orthopedic Tech Progress Note Patient Details:  [name] [birth year] [id number]  Ortho Devices Type of Ortho Device: Ace wrap, Arm sling Ortho Device/Splint Location: LUE Ortho Device/Splint Interventions: Ordered, Application   Post Interventions Patient Tolerated: Well Instructions Provided: Care of device   Braulio Bosch 08/11/2020, 10:13 PM

## 2020-08-11 NOTE — ED Notes (Signed)
An After Visit Summary was printed and given to the patient. Discharge instructions given and no further questions at this time.  
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
## 2020-08-11 NOTE — Discharge Instructions (Addendum)
You can apply ice to your arm for 20 minutes at a time.  Continue compression and elevation.  You can take anti-inflammatories for pain.  Follow-up with your primary care/lymphedema clinic.  Return for worsening symptoms.
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
## 2020-08-12 ENCOUNTER — Telehealth: Payer: Self-pay | Admitting: Physician Assistant

## 2020-08-12 ENCOUNTER — Ambulatory Visit: Payer: Medicaid Other

## 2020-08-12 ENCOUNTER — Ambulatory Visit: Payer: Medicaid Other | Admitting: Physical Therapy

## 2020-08-12 ENCOUNTER — Encounter: Payer: Self-pay | Admitting: Physical Therapy

## 2020-08-12 DIAGNOSIS — I220 Subsequent ST elevation (STEMI) myocardial infarction of anterior wall: Secondary | ICD-10-CM | POA: Diagnosis not present

## 2020-08-12 DIAGNOSIS — R262 Difficulty in walking, not elsewhere classified: Secondary | ICD-10-CM

## 2020-08-12 DIAGNOSIS — M25661 Stiffness of right knee, not elsewhere classified: Secondary | ICD-10-CM

## 2020-08-12 DIAGNOSIS — M6281 Muscle weakness (generalized): Secondary | ICD-10-CM

## 2020-08-12 DIAGNOSIS — M25561 Pain in right knee: Secondary | ICD-10-CM

## 2020-08-12 DIAGNOSIS — I890 Lymphedema, not elsewhere classified: Secondary | ICD-10-CM

## 2020-08-12 NOTE — Telephone Encounter (Signed)
Too soon for refill.
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
## 2020-08-12 NOTE — Telephone Encounter (Signed)
Patient called back advised she uses the [facility] [address]   Please see previous message      Patient ph# is [phone]

## 2020-08-12 NOTE — Telephone Encounter (Signed)
Pt is s/p a 06/11/20 right total knee replacement and is asking for refill on her Oxycodone 5/325 had had a total  of 90 this month and 270 Methadone 5 mg tabs. Please advise.
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
## 2020-08-12 NOTE — Telephone Encounter (Signed)
The pt would like to have her oxycodone called in on 08/13/20 please

## 2020-08-13 ENCOUNTER — Telehealth: Payer: Self-pay | Admitting: *Deleted

## 2020-08-13 DIAGNOSIS — I220 Subsequent ST elevation (STEMI) myocardial infarction of anterior wall: Secondary | ICD-10-CM | POA: Diagnosis not present

## 2020-08-13 NOTE — Telephone Encounter (Signed)
I called pt to advise and she will discuss pain medication at her appt on 08/26/20

## 2020-08-13 NOTE — Telephone Encounter (Signed)
Duplicate message this has been done.  ?

## 2020-08-13 NOTE — Telephone Encounter (Signed)
Contacted patient to complete transition of care assessment:  Transition Care Management Follow-up Telephone Call   [facility] Managed Care Transition Call Status:MM [facility] Call Made   Date of discharge and from where: [facility], 08/11/20   How have you been since you were released from the hospital?  "ok' arm is still sore"   Any questions or concerns? No  Items Reviewed:  Did the pt receive and understand the discharge instructions provided? Yes   Medications obtained and verified? Yes   Any new allergies since your discharge? No   Dietary orders reviewed?  No  Do you have support at home? Patient states she has an aide that comes in  Functional Questionnaire: (I = Independent and D = Dependent)  ADLs: Dependent Bathing/Dressing:Dependent Meal Prep: Dependent Eating: Independent Maintaining continence: Independent Transferring/Ambulation: Dependent Managing Meds: Independent   Follow up appointments reviewed:  PCP Hospital f/u appt confirmed? No  Patient to calll her PCP on 08/13/20 to schedule appt  Specialist Hospital f/u appt confirmed? Patient states she was seen by her Rehab provider  Are transportation arrangements needed? No   If their condition worsens, is the pt aware to call PCP or go to the EmergencyDept.? Yes  Was the patient provided with contact information for the PCP's office or ED?  yes  Was to pt encouraged to call back with questions or concerns? Yes  Lenor Coffin, RN, BSN, [place] [facility] [place] [phone]

## 2020-08-14 ENCOUNTER — Ambulatory Visit: Payer: Medicaid Other

## 2020-08-14 ENCOUNTER — Telehealth: Payer: Self-pay | Admitting: *Deleted

## 2020-08-14 DIAGNOSIS — I220 Subsequent ST elevation (STEMI) myocardial infarction of anterior wall: Secondary | ICD-10-CM | POA: Diagnosis not present

## 2020-08-14 NOTE — Telephone Encounter (Signed)
Call received from [name] in response to [name] general discharge transition call.Patient states she would like another call  back to discuss her recent sore throat.                                                 Tenisha Fleece                                        QIX                                                  [phone]

## 2020-08-15 DIAGNOSIS — I220 Subsequent ST elevation (STEMI) myocardial infarction of anterior wall: Secondary | ICD-10-CM | POA: Diagnosis not present

## 2020-08-16 DIAGNOSIS — I220 Subsequent ST elevation (STEMI) myocardial infarction of anterior wall: Secondary | ICD-10-CM | POA: Diagnosis not present

## 2020-08-17 DIAGNOSIS — I220 Subsequent ST elevation (STEMI) myocardial infarction of anterior wall: Secondary | ICD-10-CM | POA: Diagnosis not present

## 2020-08-18 DIAGNOSIS — I220 Subsequent ST elevation (STEMI) myocardial infarction of anterior wall: Secondary | ICD-10-CM | POA: Diagnosis not present

## 2020-08-19 ENCOUNTER — Ambulatory Visit: Payer: Medicaid Other

## 2020-08-19 DIAGNOSIS — I220 Subsequent ST elevation (STEMI) myocardial infarction of anterior wall: Secondary | ICD-10-CM | POA: Diagnosis not present

## 2020-08-20 ENCOUNTER — Other Ambulatory Visit: Payer: Self-pay | Admitting: Oncology

## 2020-08-20 DIAGNOSIS — I220 Subsequent ST elevation (STEMI) myocardial infarction of anterior wall: Secondary | ICD-10-CM | POA: Diagnosis not present

## 2020-08-20 DIAGNOSIS — G8929 Other chronic pain: Secondary | ICD-10-CM

## 2020-08-20 DIAGNOSIS — I10 Essential (primary) hypertension: Secondary | ICD-10-CM

## 2020-08-20 DIAGNOSIS — Z853 Personal history of malignant neoplasm of breast: Secondary | ICD-10-CM

## 2020-08-20 DIAGNOSIS — M792 Neuralgia and neuritis, unspecified: Secondary | ICD-10-CM

## 2020-08-20 MED ORDER — METHADONE HCL 5 MG PO TABS: ORAL_TABLET | ORAL | 0 refills | Status: DC

## 2020-08-21 ENCOUNTER — Other Ambulatory Visit: Payer: Self-pay | Admitting: Adult Health

## 2020-08-21 ENCOUNTER — Ambulatory Visit: Payer: Medicaid Other | Admitting: Physical Therapy

## 2020-08-21 ENCOUNTER — Other Ambulatory Visit: Payer: Self-pay | Admitting: Oncology

## 2020-08-21 DIAGNOSIS — I10 Essential (primary) hypertension: Secondary | ICD-10-CM

## 2020-08-21 DIAGNOSIS — G8929 Other chronic pain: Secondary | ICD-10-CM

## 2020-08-21 DIAGNOSIS — M792 Neuralgia and neuritis, unspecified: Secondary | ICD-10-CM

## 2020-08-21 DIAGNOSIS — Z853 Personal history of malignant neoplasm of breast: Secondary | ICD-10-CM

## 2020-08-21 DIAGNOSIS — I220 Subsequent ST elevation (STEMI) myocardial infarction of anterior wall: Secondary | ICD-10-CM | POA: Diagnosis not present

## 2020-08-21 MED ORDER — METHADONE HCL 5 MG PO TABS: ORAL_TABLET | ORAL | 0 refills | Status: DC

## 2020-08-21 MED FILL — METHADONE HCL 5 MG TABS: 5 | 30 days supply | Qty: 270 | Fill #0

## 2020-08-21 NOTE — Telephone Encounter (Signed)
Please refuse refill request.  Medication was refilled yesterday 9/1

## 2020-08-22 DIAGNOSIS — I220 Subsequent ST elevation (STEMI) myocardial infarction of anterior wall: Secondary | ICD-10-CM | POA: Diagnosis not present

## 2020-08-23 DIAGNOSIS — I220 Subsequent ST elevation (STEMI) myocardial infarction of anterior wall: Secondary | ICD-10-CM | POA: Diagnosis not present

## 2020-08-24 DIAGNOSIS — I220 Subsequent ST elevation (STEMI) myocardial infarction of anterior wall: Secondary | ICD-10-CM | POA: Diagnosis not present

## 2020-08-25 ENCOUNTER — Other Ambulatory Visit: Payer: Self-pay | Admitting: Family Medicine

## 2020-08-25 DIAGNOSIS — I220 Subsequent ST elevation (STEMI) myocardial infarction of anterior wall: Secondary | ICD-10-CM | POA: Diagnosis not present

## 2020-08-26 ENCOUNTER — Ambulatory Visit: Payer: Medicaid Other | Admitting: Orthopedic Surgery

## 2020-08-26 ENCOUNTER — Ambulatory Visit: Payer: Medicaid Other | Admitting: Physical Therapy

## 2020-08-26 DIAGNOSIS — I220 Subsequent ST elevation (STEMI) myocardial infarction of anterior wall: Secondary | ICD-10-CM | POA: Diagnosis not present

## 2020-08-27 DIAGNOSIS — I220 Subsequent ST elevation (STEMI) myocardial infarction of anterior wall: Secondary | ICD-10-CM | POA: Diagnosis not present

## 2020-08-28 ENCOUNTER — Ambulatory Visit: Payer: Medicaid Other

## 2020-08-28 DIAGNOSIS — I220 Subsequent ST elevation (STEMI) myocardial infarction of anterior wall: Secondary | ICD-10-CM | POA: Diagnosis not present

## 2020-08-29 DIAGNOSIS — I220 Subsequent ST elevation (STEMI) myocardial infarction of anterior wall: Secondary | ICD-10-CM | POA: Diagnosis not present

## 2020-08-30 DIAGNOSIS — I220 Subsequent ST elevation (STEMI) myocardial infarction of anterior wall: Secondary | ICD-10-CM | POA: Diagnosis not present

## 2020-08-31 DIAGNOSIS — I220 Subsequent ST elevation (STEMI) myocardial infarction of anterior wall: Secondary | ICD-10-CM | POA: Diagnosis not present

## 2020-09-01 ENCOUNTER — Encounter: Payer: Self-pay | Admitting: Physician Assistant

## 2020-09-01 ENCOUNTER — Ambulatory Visit: Payer: Medicaid Other | Admitting: Podiatry

## 2020-09-01 ENCOUNTER — Ambulatory Visit (INDEPENDENT_AMBULATORY_CARE_PROVIDER_SITE_OTHER): Payer: Medicaid Other | Admitting: Physician Assistant

## 2020-09-01 VITALS — Ht 65.0 in | Wt 230.0 lb

## 2020-09-01 DIAGNOSIS — I220 Subsequent ST elevation (STEMI) myocardial infarction of anterior wall: Secondary | ICD-10-CM | POA: Diagnosis not present

## 2020-09-01 DIAGNOSIS — M1711 Unilateral primary osteoarthritis, right knee: Secondary | ICD-10-CM

## 2020-09-01 MED ORDER — OXYCODONE-ACETAMINOPHEN 5-325 MG PO TABS
1.0000 | ORAL_TABLET | Freq: Three times a day (TID) | ORAL | 0 refills | Status: DC | PRN
Start: 2020-09-01 — End: 2020-09-10
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
## 2020-09-02 DIAGNOSIS — I220 Subsequent ST elevation (STEMI) myocardial infarction of anterior wall: Secondary | ICD-10-CM | POA: Diagnosis not present

## 2020-09-03 ENCOUNTER — Ambulatory Visit: Payer: Medicaid Other | Admitting: Physical Therapy

## 2020-09-03 DIAGNOSIS — I220 Subsequent ST elevation (STEMI) myocardial infarction of anterior wall: Secondary | ICD-10-CM | POA: Diagnosis not present

## 2020-09-04 DIAGNOSIS — I220 Subsequent ST elevation (STEMI) myocardial infarction of anterior wall: Secondary | ICD-10-CM | POA: Diagnosis not present

## 2020-09-05 DIAGNOSIS — I220 Subsequent ST elevation (STEMI) myocardial infarction of anterior wall: Secondary | ICD-10-CM | POA: Diagnosis not present

## 2020-09-06 DIAGNOSIS — I220 Subsequent ST elevation (STEMI) myocardial infarction of anterior wall: Secondary | ICD-10-CM | POA: Diagnosis not present

## 2020-09-07 DIAGNOSIS — I220 Subsequent ST elevation (STEMI) myocardial infarction of anterior wall: Secondary | ICD-10-CM | POA: Diagnosis not present

## 2020-09-08 ENCOUNTER — Ambulatory Visit: Payer: Medicaid Other | Admitting: Family Medicine

## 2020-09-08 ENCOUNTER — Encounter: Payer: Self-pay | Admitting: Licensed Clinical Social Worker

## 2020-09-08 DIAGNOSIS — I220 Subsequent ST elevation (STEMI) myocardial infarction of anterior wall: Secondary | ICD-10-CM | POA: Diagnosis not present

## 2020-09-08 NOTE — Progress Notes (Signed)
[place] CSW Progress Note  Clinical Education officer, museum received TC from patient inquiring if she can receive more [employer] or any other assistance. Patient has utilized all funds from [facility] and is not eligible for most cancer foundations due to no longer being in active treatment. Patient stated she will reapply for [facility] in November.  CSW shared information on emergency assistance through [facility] but patient stated she does not need help with things like utility bills. Information given should she need in the future.    Edwinna Areola Octavie Westerhold , LCSW

## 2020-09-09 ENCOUNTER — Other Ambulatory Visit: Payer: Self-pay

## 2020-09-09 ENCOUNTER — Ambulatory Visit: Payer: Medicaid Other | Attending: Orthopedic Surgery

## 2020-09-09 DIAGNOSIS — R262 Difficulty in walking, not elsewhere classified: Secondary | ICD-10-CM | POA: Insufficient documentation

## 2020-09-09 DIAGNOSIS — M25561 Pain in right knee: Secondary | ICD-10-CM | POA: Diagnosis present

## 2020-09-09 DIAGNOSIS — M6281 Muscle weakness (generalized): Secondary | ICD-10-CM | POA: Diagnosis present

## 2020-09-09 DIAGNOSIS — C50412 Malignant neoplasm of upper-outer quadrant of left female breast: Secondary | ICD-10-CM

## 2020-09-09 DIAGNOSIS — M25661 Stiffness of right knee, not elsewhere classified: Secondary | ICD-10-CM | POA: Diagnosis present

## 2020-09-09 DIAGNOSIS — M25461 Effusion, right knee: Secondary | ICD-10-CM | POA: Insufficient documentation

## 2020-09-09 DIAGNOSIS — I220 Subsequent ST elevation (STEMI) myocardial infarction of anterior wall: Secondary | ICD-10-CM | POA: Diagnosis not present
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
## 2020-09-10 ENCOUNTER — Telehealth: Payer: Self-pay | Admitting: Orthopedic Surgery

## 2020-09-10 ENCOUNTER — Other Ambulatory Visit: Payer: Self-pay | Admitting: Physician Assistant

## 2020-09-10 DIAGNOSIS — I220 Subsequent ST elevation (STEMI) myocardial infarction of anterior wall: Secondary | ICD-10-CM | POA: Diagnosis not present

## 2020-09-10 MED ORDER — OXYCODONE-ACETAMINOPHEN 5-325 MG PO TABS
1.0000 | ORAL_TABLET | Freq: Three times a day (TID) | ORAL | 0 refills | Status: DC | PRN
Start: 2020-09-10 — End: 2020-09-18

## 2020-09-10 NOTE — Telephone Encounter (Signed)
Patient called requesting oxycodone. Patient states her pt classes have her in pain. Please send refill to pharmacy on file. Patient phone number is [phone]. Please call patient when script has been called in.

## 2020-09-10 NOTE — Telephone Encounter (Signed)
06/11/20 right TKR requesting a refill on her Oxycodone due to increased pain after physical therapy appointments. Please advise.

## 2020-09-10 NOTE — Telephone Encounter (Signed)
Please let patient know this will be only refill

## 2020-09-10 NOTE — Telephone Encounter (Signed)
Called pt and advised of message below. Will call with any other questions.

## 2020-09-11 ENCOUNTER — Inpatient Hospital Stay: Payer: Medicaid Other

## 2020-09-11 ENCOUNTER — Other Ambulatory Visit: Payer: Self-pay

## 2020-09-11 ENCOUNTER — Inpatient Hospital Stay: Payer: Medicaid Other | Attending: Oncology | Admitting: Oncology

## 2020-09-11 VITALS — BP 147/76 | HR 76 | Temp 97.4°F | Resp 18 | Ht 65.0 in | Wt 232.4 lb

## 2020-09-11 DIAGNOSIS — F17210 Nicotine dependence, cigarettes, uncomplicated: Secondary | ICD-10-CM | POA: Insufficient documentation

## 2020-09-11 DIAGNOSIS — Z79810 Long term (current) use of selective estrogen receptor modulators (SERMs): Secondary | ICD-10-CM | POA: Diagnosis not present

## 2020-09-11 DIAGNOSIS — C50912 Malignant neoplasm of unspecified site of left female breast: Secondary | ICD-10-CM | POA: Insufficient documentation

## 2020-09-11 DIAGNOSIS — G894 Chronic pain syndrome: Secondary | ICD-10-CM | POA: Diagnosis not present

## 2020-09-11 DIAGNOSIS — C50412 Malignant neoplasm of upper-outer quadrant of left female breast: Secondary | ICD-10-CM

## 2020-09-11 DIAGNOSIS — Z170 Estrogen receptor positive status [ER+]: Secondary | ICD-10-CM | POA: Diagnosis not present

## 2020-09-11 DIAGNOSIS — Z79899 Other long term (current) drug therapy: Secondary | ICD-10-CM | POA: Diagnosis not present

## 2020-09-11 DIAGNOSIS — I10 Essential (primary) hypertension: Secondary | ICD-10-CM

## 2020-09-11 DIAGNOSIS — Z853 Personal history of malignant neoplasm of breast: Secondary | ICD-10-CM | POA: Diagnosis not present

## 2020-09-11 DIAGNOSIS — C50811 Malignant neoplasm of overlapping sites of right female breast: Secondary | ICD-10-CM

## 2020-09-11 DIAGNOSIS — C50911 Malignant neoplasm of unspecified site of right female breast: Secondary | ICD-10-CM | POA: Diagnosis not present

## 2020-09-11 DIAGNOSIS — M792 Neuralgia and neuritis, unspecified: Secondary | ICD-10-CM | POA: Diagnosis not present

## 2020-09-11 DIAGNOSIS — I220 Subsequent ST elevation (STEMI) myocardial infarction of anterior wall: Secondary | ICD-10-CM | POA: Diagnosis not present

## 2020-09-11 DIAGNOSIS — Z9013 Acquired absence of bilateral breasts and nipples: Secondary | ICD-10-CM | POA: Diagnosis not present

## 2020-09-11 DIAGNOSIS — G8929 Other chronic pain: Secondary | ICD-10-CM | POA: Diagnosis not present

## 2020-09-11 LAB — CMP (CANCER CENTER ONLY)
ALT: 17 U/L (ref 0–44)
AST: 20 U/L (ref 15–41)
Albumin: 3.4 g/dL — ABNORMAL LOW (ref 3.5–5.0)
Alkaline Phosphatase: 102 U/L (ref 38–126)
Anion gap: 8 (ref 5–15)
BUN: 11 mg/dL (ref 6–20)
CO2: 24 mmol/L (ref 22–32)
Calcium: 9.2 mg/dL (ref 8.9–10.3)
Chloride: 108 mmol/L (ref 98–111)
Creatinine: 0.83 mg/dL (ref 0.44–1.00)
GFR, Est AFR Am: >60 mL/min (ref >60)
GFR, Estimated: >60 mL/min (ref >60)
Glucose, Bld: 102 mg/dL — ABNORMAL HIGH (ref 70–99)
Potassium: 3.7 mmol/L (ref 3.5–5.1)
Sodium: 140 mmol/L (ref 135–145)
Total Bilirubin: 0.7 mg/dL (ref 0.3–1.2)
Total Protein: 6.9 g/dL (ref 6.5–8.1)

## 2020-09-11 LAB — CBC WITH DIFFERENTIAL (CANCER CENTER ONLY)
Abs Immature Granulocytes: 0.05 10*3/uL (ref 0.00–0.07)
Basophils Absolute: 0.1 10*3/uL (ref 0.0–0.1)
Basophils Relative: 1 %
Eosinophils Absolute: 0.2 10*3/uL (ref 0.0–0.5)
Eosinophils Relative: 2 %
HCT: 42.1 % (ref 36.0–46.0)
Hemoglobin: 13.9 g/dL (ref 12.0–15.0)
Immature Granulocytes: 1 %
Lymphocytes Relative: 23 %
Lymphs Abs: 2.2 10*3/uL (ref 0.7–4.0)
MCH: 27.9 pg (ref 26.0–34.0)
MCHC: 33 g/dL (ref 30.0–36.0)
MCV: 84.4 fL (ref 80.0–100.0)
Monocytes Absolute: 0.7 10*3/uL (ref 0.1–1.0)
Monocytes Relative: 8 %
Neutro Abs: 6.4 10*3/uL (ref 1.7–7.7)
Neutrophils Relative %: 65 %
Platelet Count: 488 10*3/uL — ABNORMAL HIGH (ref 150–400)
RBC: 4.99 MIL/uL (ref 3.87–5.11)
RDW: 16.1 % — ABNORMAL HIGH (ref 11.5–15.5)
WBC Count: 9.6 10*3/uL (ref 4.0–10.5)
nRBC: 0 % (ref 0.0–0.2)

## 2020-09-11 MED ORDER — METHADONE HCL 5 MG PO TABS: ORAL_TABLET | ORAL | 0 refills | Status: DC
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
## 2020-09-12 DIAGNOSIS — I220 Subsequent ST elevation (STEMI) myocardial infarction of anterior wall: Secondary | ICD-10-CM | POA: Diagnosis not present

## 2020-09-13 DIAGNOSIS — I220 Subsequent ST elevation (STEMI) myocardial infarction of anterior wall: Secondary | ICD-10-CM | POA: Diagnosis not present

## 2020-09-14 DIAGNOSIS — I220 Subsequent ST elevation (STEMI) myocardial infarction of anterior wall: Secondary | ICD-10-CM | POA: Diagnosis not present

## 2020-09-15 ENCOUNTER — Ambulatory Visit (INDEPENDENT_AMBULATORY_CARE_PROVIDER_SITE_OTHER): Payer: Medicaid Other | Admitting: Podiatry

## 2020-09-15 ENCOUNTER — Other Ambulatory Visit: Payer: Self-pay

## 2020-09-15 ENCOUNTER — Ambulatory Visit (INDEPENDENT_AMBULATORY_CARE_PROVIDER_SITE_OTHER): Payer: Medicaid Other

## 2020-09-15 DIAGNOSIS — M205X2 Other deformities of toe(s) (acquired), left foot: Secondary | ICD-10-CM

## 2020-09-15 DIAGNOSIS — M19072 Primary osteoarthritis, left ankle and foot: Secondary | ICD-10-CM

## 2020-09-15 DIAGNOSIS — M7752 Other enthesopathy of left foot: Secondary | ICD-10-CM | POA: Diagnosis not present

## 2020-09-15 DIAGNOSIS — I220 Subsequent ST elevation (STEMI) myocardial infarction of anterior wall: Secondary | ICD-10-CM | POA: Diagnosis not present

## 2020-09-16 DIAGNOSIS — I220 Subsequent ST elevation (STEMI) myocardial infarction of anterior wall: Secondary | ICD-10-CM | POA: Diagnosis not present
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
## 2020-09-17 ENCOUNTER — Telehealth: Payer: Self-pay | Admitting: Podiatry

## 2020-09-17 ENCOUNTER — Telehealth: Payer: Self-pay

## 2020-09-17 DIAGNOSIS — I220 Subsequent ST elevation (STEMI) myocardial infarction of anterior wall: Secondary | ICD-10-CM | POA: Diagnosis not present

## 2020-09-17 MED FILL — METHADONE HCL 5 MG TABS: 5 | 30 days supply | Qty: 270 | Fill #0

## 2020-09-17 NOTE — Telephone Encounter (Signed)
Pt called because she was told to call Dr. Amalia Hailey to remind him to send him Oxycodone to the [facility] on [address].

## 2020-09-17 NOTE — Telephone Encounter (Addendum)
DOS 10/09/2020  OSTECT COMP MET HEAD 2ND LT - 28112 REMOVAL FIXATION DEEP X 3 LT - 20680 HALLUX MPJ FUSION LT - 28750  [employer] MEDICAID EFFECTIVE DATE - 06/19/2020    NOTIFICATION/PRIOR AUTHORIZATION NUMBER CASE STATUS CASE STATUS REASON PRIMARY CARE PHYSICIAN [id number] Open Open [name] ADVANCE NOTIFY DATE/TIME ADMISSION NOTIFY DATE/TIME 09/17/2020 08:32 AM CDT - COVERAGE STATUS OVERALL COVERAGE STATUS Covered/Approved 1-5 CODE DESCRIPTION COVERAGE STATUS DECISION DATE [facility] Coverage determination is reflected for the facility admission and is not a guarantee of payment for ongoing services. Covered/Approved 09/17/2020 1 20680 Removal of implant; deep (eg, buried wir more Covered/Approved 09/17/2020 2 20680 Removal of implant; deep (eg, buried wir more Covered/Approved 09/17/2020 3 28750 Arthrodesis, great toe; metatarsophalang more Covered/Approved 09/17/2020 4 20680 Removal of implant; deep (eg, buried wir more Covered/Approved 09/17/2020 5 28112 Ostectomy, complete excision; other meta more Covered/Approved 09/17/2020

## 2020-09-18 DIAGNOSIS — I220 Subsequent ST elevation (STEMI) myocardial infarction of anterior wall: Secondary | ICD-10-CM | POA: Diagnosis not present

## 2020-09-18 MED ORDER — OXYCODONE-ACETAMINOPHEN 5-325 MG PO TABS
1.0000 | ORAL_TABLET | Freq: Three times a day (TID) | ORAL | 0 refills | Status: DC | PRN
Start: 2020-09-18 — End: 2020-10-07

## 2020-09-18 NOTE — Addendum Note (Signed)
Addended by: Edrick Kins on: 09/18/2020 12:21 PM   Modules accepted: Orders

## 2020-09-18 NOTE — Telephone Encounter (Signed)
Pt has called several more times about getting pain medication.

## 2020-09-18 NOTE — Telephone Encounter (Signed)
Rx sent 

## 2020-09-19 DIAGNOSIS — I220 Subsequent ST elevation (STEMI) myocardial infarction of anterior wall: Secondary | ICD-10-CM | POA: Diagnosis not present

## 2020-09-20 DIAGNOSIS — I220 Subsequent ST elevation (STEMI) myocardial infarction of anterior wall: Secondary | ICD-10-CM | POA: Diagnosis not present

## 2020-09-21 DIAGNOSIS — I220 Subsequent ST elevation (STEMI) myocardial infarction of anterior wall: Secondary | ICD-10-CM | POA: Diagnosis not present

## 2020-09-22 DIAGNOSIS — I220 Subsequent ST elevation (STEMI) myocardial infarction of anterior wall: Secondary | ICD-10-CM | POA: Diagnosis not present

## 2020-09-23 ENCOUNTER — Telehealth: Payer: Self-pay

## 2020-09-23 ENCOUNTER — Ambulatory Visit: Payer: No Typology Code available for payment source

## 2020-09-23 DIAGNOSIS — I220 Subsequent ST elevation (STEMI) myocardial infarction of anterior wall: Secondary | ICD-10-CM | POA: Diagnosis not present

## 2020-09-23 NOTE — Telephone Encounter (Signed)
I agree with the below thank you Jarrett Soho. She should be ok for surgery. If she has worsening of symptoms prior to surgery she should follow up at [facility] prior to Thursday.

## 2020-09-23 NOTE — Telephone Encounter (Signed)
Patient calls nurse line regarding foot surgery on Thursday morning. Patient states that over the weekend she was having pain in her ear. Reports that she was in the shower and water went into ear. Patient used OTC ear drops with relief of symptoms. Patient currently denies ear pain, cough, fever, runny nose or nasal congestion.   Advised patient that it should be fine to proceed with surgery as symptoms have resolved, however, I will still double check with PCP.   Patient also has follow up appointment with PCP on Friday.   To PCP  Talbot Grumbling, RN

## 2020-09-24 DIAGNOSIS — I220 Subsequent ST elevation (STEMI) myocardial infarction of anterior wall: Secondary | ICD-10-CM | POA: Diagnosis not present

## 2020-09-25 ENCOUNTER — Ambulatory Visit: Payer: No Typology Code available for payment source

## 2020-09-25 DIAGNOSIS — I220 Subsequent ST elevation (STEMI) myocardial infarction of anterior wall: Secondary | ICD-10-CM | POA: Diagnosis not present

## 2020-09-26 ENCOUNTER — Ambulatory Visit: Payer: Medicaid Other | Admitting: Family Medicine

## 2020-09-26 DIAGNOSIS — I220 Subsequent ST elevation (STEMI) myocardial infarction of anterior wall: Secondary | ICD-10-CM | POA: Diagnosis not present

## 2020-09-27 DIAGNOSIS — I220 Subsequent ST elevation (STEMI) myocardial infarction of anterior wall: Secondary | ICD-10-CM | POA: Diagnosis not present

## 2020-09-28 DIAGNOSIS — I220 Subsequent ST elevation (STEMI) myocardial infarction of anterior wall: Secondary | ICD-10-CM | POA: Diagnosis not present

## 2020-09-29 ENCOUNTER — Ambulatory Visit: Payer: Self-pay

## 2020-09-29 ENCOUNTER — Ambulatory Visit (INDEPENDENT_AMBULATORY_CARE_PROVIDER_SITE_OTHER): Payer: Medicaid Other | Admitting: Orthopedic Surgery

## 2020-09-29 ENCOUNTER — Encounter: Payer: Self-pay | Admitting: Physician Assistant

## 2020-09-29 VITALS — Ht 65.0 in | Wt 232.0 lb

## 2020-09-29 DIAGNOSIS — M1A0610 Idiopathic chronic gout, right knee, without tophus (tophi): Secondary | ICD-10-CM | POA: Diagnosis not present

## 2020-09-29 DIAGNOSIS — M25561 Pain in right knee: Secondary | ICD-10-CM

## 2020-09-29 DIAGNOSIS — Z96651 Presence of right artificial knee joint: Secondary | ICD-10-CM

## 2020-09-29 DIAGNOSIS — G8929 Other chronic pain: Secondary | ICD-10-CM | POA: Diagnosis not present

## 2020-09-29 DIAGNOSIS — I220 Subsequent ST elevation (STEMI) myocardial infarction of anterior wall: Secondary | ICD-10-CM | POA: Diagnosis not present

## 2020-09-29 MED ORDER — COLCHICINE 0.6 MG PO CAPS: 0.6000 mg | ORAL_CAPSULE | Freq: Two times a day (BID) | ORAL | 1 refills | Status: DC | PRN
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
## 2020-09-30 ENCOUNTER — Ambulatory Visit: Payer: Medicaid Other | Admitting: Family Medicine

## 2020-09-30 DIAGNOSIS — I220 Subsequent ST elevation (STEMI) myocardial infarction of anterior wall: Secondary | ICD-10-CM | POA: Diagnosis not present

## 2020-09-30 LAB — URIC ACID: Uric Acid, Serum: 3.9 mg/dL (ref 2.5–7.0)

## 2020-10-01 ENCOUNTER — Encounter: Payer: Medicaid Other | Admitting: Podiatry

## 2020-10-01 DIAGNOSIS — I220 Subsequent ST elevation (STEMI) myocardial infarction of anterior wall: Secondary | ICD-10-CM | POA: Diagnosis not present

## 2020-10-02 ENCOUNTER — Other Ambulatory Visit: Payer: Self-pay

## 2020-10-02 ENCOUNTER — Encounter (HOSPITAL_BASED_OUTPATIENT_CLINIC_OR_DEPARTMENT_OTHER): Payer: Self-pay | Admitting: Podiatry

## 2020-10-02 DIAGNOSIS — I220 Subsequent ST elevation (STEMI) myocardial infarction of anterior wall: Secondary | ICD-10-CM | POA: Diagnosis not present

## 2020-10-02 NOTE — Progress Notes (Signed)
Spoke w/ via phone for pre-op interview-- Savannah Shields RN - Lab needs dos----   [id number]             Lab results------ekg-06/03/20-epic  12/24/19- stress test  LOV- card- 10/30/19-epic  COVID test ------10/06/20-0855am Arrive at -------1200 NPO after MN NO Solid Food.  Clear liquids from MN until---1100am Medications to take morning of surgery -----none, pt takes all meds in the pm per pt  Diabetic medication -----none   Pre-Op special Istructions -----none  Patient verbalized understanding of instructions that were given at this phone interview. Patient denies shortness of breath, chest pain, fever, cough at this phone interview. Pt to see PCP - DR Posey Pronto with [facility] on 10/07/2020 per pt  Card- DR Daneen Schick

## 2020-10-03 ENCOUNTER — Telehealth: Payer: Medicaid Other | Admitting: Plastic Surgery

## 2020-10-03 DIAGNOSIS — I220 Subsequent ST elevation (STEMI) myocardial infarction of anterior wall: Secondary | ICD-10-CM | POA: Diagnosis not present

## 2020-10-04 DIAGNOSIS — I220 Subsequent ST elevation (STEMI) myocardial infarction of anterior wall: Secondary | ICD-10-CM | POA: Diagnosis not present

## 2020-10-05 DIAGNOSIS — I220 Subsequent ST elevation (STEMI) myocardial infarction of anterior wall: Secondary | ICD-10-CM | POA: Diagnosis not present

## 2020-10-06 ENCOUNTER — Other Ambulatory Visit (HOSPITAL_COMMUNITY)
Admission: RE | Admit: 2020-10-06 | Discharge: 2020-10-06 | Disposition: A | Discharge status: Home / Self Care | Payer: Medicaid Other | Source: Ambulatory Visit | Attending: Podiatry | Admitting: Podiatry

## 2020-10-06 DIAGNOSIS — Z20822 Contact with and (suspected) exposure to covid-19: Secondary | ICD-10-CM | POA: Diagnosis not present

## 2020-10-06 DIAGNOSIS — Z01812 Encounter for preprocedural laboratory examination: Secondary | ICD-10-CM | POA: Insufficient documentation

## 2020-10-06 DIAGNOSIS — I220 Subsequent ST elevation (STEMI) myocardial infarction of anterior wall: Secondary | ICD-10-CM | POA: Diagnosis not present

## 2020-10-06 LAB — SARS CORONAVIRUS 2 (TAT 6-24 HRS): SARS Coronavirus 2: NEGATIVE

## 2020-10-07 ENCOUNTER — Other Ambulatory Visit: Payer: Self-pay

## 2020-10-07 ENCOUNTER — Ambulatory Visit (HOSPITAL_COMMUNITY)
Admission: RE | Admit: 2020-10-07 | Discharge: 2020-10-07 | Disposition: A | Discharge status: Home / Self Care | Payer: Medicaid Other | Source: Ambulatory Visit | Attending: Family Medicine | Admitting: Family Medicine

## 2020-10-07 ENCOUNTER — Ambulatory Visit (INDEPENDENT_AMBULATORY_CARE_PROVIDER_SITE_OTHER): Payer: Medicaid Other | Admitting: Family Medicine

## 2020-10-07 ENCOUNTER — Encounter: Payer: Self-pay | Admitting: Family Medicine

## 2020-10-07 VITALS — BP 122/64 | HR 70 | Wt 232.0 lb

## 2020-10-07 DIAGNOSIS — Z01818 Encounter for other preprocedural examination: Secondary | ICD-10-CM | POA: Diagnosis not present

## 2020-10-07 DIAGNOSIS — Z01810 Encounter for preprocedural cardiovascular examination: Secondary | ICD-10-CM | POA: Diagnosis not present

## 2020-10-07 DIAGNOSIS — M79672 Pain in left foot: Secondary | ICD-10-CM

## 2020-10-07 DIAGNOSIS — I220 Subsequent ST elevation (STEMI) myocardial infarction of anterior wall: Secondary | ICD-10-CM | POA: Diagnosis not present

## 2020-10-07 MED ORDER — OXYCODONE-ACETAMINOPHEN 5-325 MG PO TABS
1.0000 | ORAL_TABLET | Freq: Three times a day (TID) | ORAL | 0 refills | Status: DC | PRN
Start: 2020-10-07 — End: 2020-10-09

## 2020-10-07 NOTE — Patient Instructions (Addendum)
It was nice to meet you today.  Refilled 5 tablets of percocet. We will not prescribe any more of this medication for you. After your surgery, I strongly suggest you take only one narcotic medication (just methadone, no percocet on top). Please be very cautious when taking narcotic medications and do not take more than prescribed. Do not take them if you are very sleepy.  Based on today's evaluation, your risk of complication after surgery is above average, due to your known medical issues (heart disease, overweight, smoking). You seem to be as optimized for surgery as is possible in the short term, so it is reasonable to proceed with your surgery as long as you accept that risk.  Please schedule a follow up visit to meet Dr. Posey Pronto after your surgery. You are behind on several health maintenance items.  Be well, Dr. Ardelia Mems

## 2020-10-07 NOTE — Progress Notes (Signed)
Date of Visit: 10/07/2020   SUBJECTIVE:   HPI:  [name] presents today for preoperative evaluation.  Back in January of this year, had bunionectomy and hammertoe repair by podiatrist Dr. Amalia Hailey. Had post operative pain and took percocet after that surgery. She then had a total knee replacement in June by Dr. Sharol Given, did well with that surgery. She was evaluated by cardiology prior to her surgery in January, who performed stress test and deemed her risk optimized for surgery from a cardiac standpoint. She is having surgery in 2 days on the L foot again to do more repairs. Had scheduled appointment for preoperative eval on 10/8 but patient missed that appointment because foot was hurting too badly to come.  History of CAD, STEMI in 2008, also hypertension, hyperlipidemia, obesity, breast cancer s/p bilateral mastectomy. Smokes 2 cigarettes per day currently. Has had bot hdoses of COVID vaccine. Able to walk up multiple flights of stairs without dyspnea or chest pain. Denies ever having issues with anesthesia, bleeding disorders, etc.  Patient also requests a short term rx for percocet for the next 2 days to last her until her surgery on Thursday. Takes methadone chronically, prescribed by her oncologist since her surgery.   Reviewed PDMP, shows consistent methadone fills until January of this year, since that time has also filled 22 prescriptions for oxycodone or oxycodone-acetaminophen, all prescribed either by podiatry or orthopedic offices. These rx's all range in duration from 5-10 day supply. Last fill 18 days ago for 10 day supply. Patient reports podiatrist could not write rx this week because he is in surgery, says to get rx from PCP office since she had appointment this week. History of cocaine use in the past, denies any recent use.  OBJECTIVE:   BP 122/64   Pulse 70   Wt 232 lb (105.2 kg)   LMP 08/07/2011   SpO2 100%   BMI 38.61 kg/m  Gen: no acute distress, pleasant,  cooperative HEENT: normocephalic, atraumatic  Heart: regular rate and rhythm, no murmur Lungs: clear to auscultation bilaterally, normal work of breathing  Neuro: alert, speech normal, grossly nonfocal Ext: L foot with postop shoe in place  ASSESSMENT/PLAN:   Health maintenance:  -advised scheduling with PCP to follow up on HM items -has had both COVID vaccine doses, need to document dates  Preoperative evaluation: No significant changes to her cardiac or medical history since she was originally deemed by cardiology to have acceptable risk for surgery back in January. Able to walk up stairs without symptoms concerning for cardiac issues. She is at higher than average risk of peri and postoperative complications due to her known CAD, obesity, smoking, hypertension, and hyperlipidemia.  EKG performed today and is unchanged from prior on my read. Discussed with patient that all surgeries have inherent risk and she is at above average risk because of her medical history, but it is reasonable to proceed with surgery if she accepts those risks.  Pain of L foot Pilar Plate discussion with patient today about inherent danger of taking multiple narcotic agents at the same time. PDMP shows many oxycodone fills but all from the same providers who have done surgery on her this year (ortho and podiatry), while she is maintained on her methadone prescribed by oncology. I am concerned she has developed new dependence on narcotics. She requests two days worth of medication to last until her surgery on Thursday. Advised I will prescribe 5 tablets, but no more, and she will not receive any further narcotic prescriptions  from this office - will need to see surgeons managing her surgical issues. Strongly encouraged her to get off the oxycodone after she recovers from her surgery. Patient agreeable and appreciative.  [place]. Ardelia Mems, [place] than 30 minutes were spent on this  encounter on the day of service, including pre-visit planning, actual face to face time, coordination of care, and documentation of visit.

## 2020-10-08 ENCOUNTER — Encounter: Payer: Medicaid Other | Admitting: Podiatry

## 2020-10-08 DIAGNOSIS — I220 Subsequent ST elevation (STEMI) myocardial infarction of anterior wall: Secondary | ICD-10-CM | POA: Diagnosis not present

## 2020-10-08 NOTE — Progress Notes (Signed)
H & P / medical clearance note dr Chrisandra Netters 10-07-2020 epic, ekg 10-07-2020 epic Need I stat 8 day of surgery

## 2020-10-09 ENCOUNTER — Ambulatory Visit (HOSPITAL_BASED_OUTPATIENT_CLINIC_OR_DEPARTMENT_OTHER): Payer: Medicaid Other | Admitting: Anesthesiology

## 2020-10-09 ENCOUNTER — Ambulatory Visit (HOSPITAL_BASED_OUTPATIENT_CLINIC_OR_DEPARTMENT_OTHER)
Admission: RE | Admit: 2020-10-09 | Discharge: 2020-10-09 | Disposition: A | Discharge status: Home / Self Care | Payer: Medicaid Other | Source: Ambulatory Visit | Attending: Podiatry | Admitting: Podiatry

## 2020-10-09 ENCOUNTER — Encounter (HOSPITAL_BASED_OUTPATIENT_CLINIC_OR_DEPARTMENT_OTHER)
Admission: RE | Disposition: A | Discharge status: Home / Self Care | Payer: Self-pay | Source: Ambulatory Visit | Attending: Podiatry

## 2020-10-09 ENCOUNTER — Encounter (HOSPITAL_BASED_OUTPATIENT_CLINIC_OR_DEPARTMENT_OTHER): Payer: Self-pay | Admitting: Podiatry

## 2020-10-09 DIAGNOSIS — I252 Old myocardial infarction: Secondary | ICD-10-CM | POA: Insufficient documentation

## 2020-10-09 DIAGNOSIS — G8929 Other chronic pain: Secondary | ICD-10-CM | POA: Diagnosis not present

## 2020-10-09 DIAGNOSIS — Z4889 Encounter for other specified surgical aftercare: Secondary | ICD-10-CM | POA: Diagnosis not present

## 2020-10-09 DIAGNOSIS — Z853 Personal history of malignant neoplasm of breast: Secondary | ICD-10-CM | POA: Diagnosis not present

## 2020-10-09 DIAGNOSIS — Y798 Miscellaneous orthopedic devices associated with adverse incidents, not elsewhere classified: Secondary | ICD-10-CM | POA: Insufficient documentation

## 2020-10-09 DIAGNOSIS — I10 Essential (primary) hypertension: Secondary | ICD-10-CM | POA: Diagnosis not present

## 2020-10-09 DIAGNOSIS — W208XXA Other cause of strike by thrown, projected or falling object, initial encounter: Secondary | ICD-10-CM | POA: Diagnosis not present

## 2020-10-09 DIAGNOSIS — M7752 Other enthesopathy of left foot: Secondary | ICD-10-CM | POA: Insufficient documentation

## 2020-10-09 DIAGNOSIS — Z9221 Personal history of antineoplastic chemotherapy: Secondary | ICD-10-CM | POA: Insufficient documentation

## 2020-10-09 DIAGNOSIS — M205X2 Other deformities of toe(s) (acquired), left foot: Secondary | ICD-10-CM | POA: Insufficient documentation

## 2020-10-09 DIAGNOSIS — T8484XA Pain due to internal orthopedic prosthetic devices, implants and grafts, initial encounter: Secondary | ICD-10-CM | POA: Diagnosis not present

## 2020-10-09 DIAGNOSIS — Z96651 Presence of right artificial knee joint: Secondary | ICD-10-CM | POA: Diagnosis not present

## 2020-10-09 DIAGNOSIS — Z9013 Acquired absence of bilateral breasts and nipples: Secondary | ICD-10-CM | POA: Diagnosis not present

## 2020-10-09 DIAGNOSIS — I2510 Atherosclerotic heart disease of native coronary artery without angina pectoris: Secondary | ICD-10-CM | POA: Insufficient documentation

## 2020-10-09 DIAGNOSIS — M170 Bilateral primary osteoarthritis of knee: Secondary | ICD-10-CM | POA: Insufficient documentation

## 2020-10-09 DIAGNOSIS — Z923 Personal history of irradiation: Secondary | ICD-10-CM | POA: Insufficient documentation

## 2020-10-09 DIAGNOSIS — Z955 Presence of coronary angioplasty implant and graft: Secondary | ICD-10-CM | POA: Insufficient documentation

## 2020-10-09 DIAGNOSIS — E785 Hyperlipidemia, unspecified: Secondary | ICD-10-CM | POA: Diagnosis not present

## 2020-10-09 DIAGNOSIS — I220 Subsequent ST elevation (STEMI) myocardial infarction of anterior wall: Secondary | ICD-10-CM | POA: Diagnosis not present

## 2020-10-09 HISTORY — PX: ARTHRODESIS METATARSAL: SHX6565

## 2020-10-09 LAB — POCT I-STAT, CHEM 8
BUN: 9 mg/dL (ref 6–20)
Calcium, Ion: 1.2 mmol/L (ref 1.15–1.40)
Chloride: 107 mmol/L (ref 98–111)
Creatinine, Ser: 0.9 mg/dL (ref 0.44–1.00)
Glucose, Bld: 101 mg/dL — ABNORMAL HIGH (ref 70–99)
HCT: 47 % — ABNORMAL HIGH (ref 36.0–46.0)
Hemoglobin: 16 g/dL — ABNORMAL HIGH (ref 12.0–15.0)
Potassium: 3.4 mmol/L — ABNORMAL LOW (ref 3.5–5.1)
Sodium: 145 mmol/L (ref 135–145)
TCO2: 23 mmol/L (ref 22–32)

## 2020-10-09 SURGERY — FUSION, JOINT, INVOLVING METATARSAL BONE
Anesthesia: General | Site: Foot | Laterality: Left

## 2020-10-09 MED ORDER — CEFAZOLIN SODIUM-DEXTROSE 2-4 GM/100ML-% IV SOLN
2.0000 g | INTRAVENOUS | Status: AC
  Administered 2020-10-09: 2 g via INTRAVENOUS

## 2020-10-09 MED ORDER — FENTANYL CITRATE (PF) 100 MCG/2ML IJ SOLN
INTRAMUSCULAR | Status: DC | PRN
Start: 2020-10-09 — End: 2020-10-09
  Administered 2020-10-09 (×8): 25 ug via INTRAVENOUS

## 2020-10-09 MED ORDER — DEXAMETHASONE SODIUM PHOSPHATE 4 MG/ML IJ SOLN
INTRAMUSCULAR | Status: DC | PRN
  Administered 2020-10-09: 10 mg via INTRAVENOUS

## 2020-10-09 MED ORDER — OXYCODONE HCL 5 MG PO TABS: 5.0000 mg | ORAL_TABLET | Freq: Once | ORAL | Status: DC | PRN

## 2020-10-09 MED ORDER — EPHEDRINE 5 MG/ML INJ
INTRAVENOUS | Status: AC
  Filled 2020-10-09: qty 10

## 2020-10-09 MED ORDER — LIDOCAINE HCL 2 % IJ SOLN
INTRAMUSCULAR | Status: DC | PRN
  Administered 2020-10-09: 10 mL

## 2020-10-09 MED ORDER — FENTANYL CITRATE (PF) 100 MCG/2ML IJ SOLN
INTRAMUSCULAR | Status: AC
  Filled 2020-10-09: qty 2

## 2020-10-09 MED ORDER — EPHEDRINE SULFATE 50 MG/ML IJ SOLN
INTRAMUSCULAR | Status: DC | PRN
  Administered 2020-10-09 (×3): 10 mg via INTRAVENOUS

## 2020-10-09 MED ORDER — BUPIVACAINE HCL (PF) 0.5 % IJ SOLN
INTRAMUSCULAR | Status: DC | PRN
  Administered 2020-10-09: 10 mL

## 2020-10-09 MED ORDER — DEXAMETHASONE SODIUM PHOSPHATE 10 MG/ML IJ SOLN
INTRAMUSCULAR | Status: AC
  Filled 2020-10-09: qty 1

## 2020-10-09 MED ORDER — LIDOCAINE 2% (20 MG/ML) 5 ML SYRINGE
INTRAMUSCULAR | Status: AC
  Filled 2020-10-09: qty 5

## 2020-10-09 MED ORDER — LIDOCAINE HCL (CARDIAC) PF 100 MG/5ML IV SOSY
PREFILLED_SYRINGE | INTRAVENOUS | Status: DC | PRN
  Administered 2020-10-09: 100 mg via INTRAVENOUS

## 2020-10-09 MED ORDER — OXYCODONE-ACETAMINOPHEN 5-325 MG PO TABS
1.0000 | ORAL_TABLET | ORAL | 0 refills | Status: DC | PRN
Start: 2020-10-09 — End: 2020-10-13

## 2020-10-09 MED ORDER — MIDAZOLAM HCL 2 MG/2ML IJ SOLN
INTRAMUSCULAR | Status: AC
  Filled 2020-10-09: qty 2

## 2020-10-09 MED ORDER — KETOROLAC TROMETHAMINE 30 MG/ML IJ SOLN
INTRAMUSCULAR | Status: AC
  Filled 2020-10-09: qty 1

## 2020-10-09 MED ORDER — PROPOFOL 10 MG/ML IV BOLUS
INTRAVENOUS | Status: AC
  Filled 2020-10-09: qty 20

## 2020-10-09 MED ORDER — KETOROLAC TROMETHAMINE 30 MG/ML IJ SOLN
INTRAMUSCULAR | Status: DC | PRN
  Administered 2020-10-09: 30 mg via INTRAVENOUS

## 2020-10-09 MED ORDER — KETOROLAC TROMETHAMINE 30 MG/ML IJ SOLN: 30.0000 mg | Freq: Once | INTRAMUSCULAR | Status: DC | PRN

## 2020-10-09 MED ORDER — LACTATED RINGERS IV SOLN: INTRAVENOUS | Status: DC

## 2020-10-09 MED ORDER — PROPOFOL 10 MG/ML IV BOLUS
INTRAVENOUS | Status: DC | PRN
  Administered 2020-10-09: 200 mg via INTRAVENOUS

## 2020-10-09 MED ORDER — CEFAZOLIN SODIUM-DEXTROSE 2-4 GM/100ML-% IV SOLN
INTRAVENOUS | Status: AC
  Filled 2020-10-09: qty 100

## 2020-10-09 MED ORDER — MIDAZOLAM HCL 5 MG/5ML IJ SOLN
INTRAMUSCULAR | Status: DC | PRN
  Administered 2020-10-09: 2 mg via INTRAVENOUS

## 2020-10-09 MED ORDER — ONDANSETRON HCL 4 MG/2ML IJ SOLN
INTRAMUSCULAR | Status: DC | PRN
  Administered 2020-10-09: 4 mg via INTRAVENOUS

## 2020-10-09 MED ORDER — OXYCODONE HCL 5 MG/5ML PO SOLN: 5.0000 mg | Freq: Once | ORAL | Status: DC | PRN

## 2020-10-09 MED ORDER — FENTANYL CITRATE (PF) 100 MCG/2ML IJ SOLN
25.0000 ug | INTRAMUSCULAR | Status: DC | PRN
  Administered 2020-10-09: 50 ug via INTRAVENOUS

## 2020-10-09 MED ORDER — ONDANSETRON HCL 4 MG/2ML IJ SOLN
INTRAMUSCULAR | Status: AC
  Filled 2020-10-09: qty 2

## 2020-10-09 SURGICAL SUPPLY — 73 items
APL PRP STRL LF DISP 70% ISPRP (MISCELLANEOUS) ×1
BANDAGE ACE 3X5.8 VEL STRL LF (GAUZE/BANDAGES/DRESSINGS) ×3 IMPLANT
BLADE SURG 15 STRL LF DISP TIS (BLADE) ×2 IMPLANT
BLADE SURG 15 STRL SS (BLADE) ×6
BNDG CMPR 9X4 STRL LF SNTH (GAUZE/BANDAGES/DRESSINGS) ×1
BNDG ELASTIC 3X5.8 VLCR STR LF (GAUZE/BANDAGES/DRESSINGS) ×2 IMPLANT
BNDG ELASTIC 4X5.8 VLCR STR LF (GAUZE/BANDAGES/DRESSINGS) ×4 IMPLANT
BNDG ESMARK 4X9 LF (GAUZE/BANDAGES/DRESSINGS) ×3 IMPLANT
BNDG GAUZE ELAST 4 BULKY (GAUZE/BANDAGES/DRESSINGS) ×3 IMPLANT
CHLORAPREP W/TINT 26 (MISCELLANEOUS) ×3 IMPLANT
COVER BACK TABLE 60X90IN (DRAPES) ×3 IMPLANT
COVER WAND RF STERILE (DRAPES) ×3 IMPLANT
CROSSROADS DYNAFORCE MPJ REAMERS ×2 IMPLANT
CROSSROADS RATCHET HANDLE ×2 IMPLANT
CUFF TOURN SGL QUICK 18X4 (TOURNIQUET CUFF) ×2 IMPLANT
CUFF TOURN SGL QUICK 34 (TOURNIQUET CUFF)
CUFF TRNQT CYL 34X4.125X (TOURNIQUET CUFF) ×1 IMPLANT
DRAPE C-ARM 35X43 STRL (DRAPES) ×3 IMPLANT
DRAPE EXTREMITY T 121X128X90 (DISPOSABLE) ×3 IMPLANT
DRAPE IMP U-DRAPE 54X76 (DRAPES) ×3 IMPLANT
DRAPE SURG 17X23 STRL (DRAPES) IMPLANT
DRAPE U-SHAPE 47X51 STRL (DRAPES) ×3 IMPLANT
ELECT REM PT RETURN 9FT ADLT (ELECTROSURGICAL) ×3
ELECTRODE REM PT RTRN 9FT ADLT (ELECTROSURGICAL) ×1 IMPLANT
GAUZE 4X4 16PLY RFD (DISPOSABLE) IMPLANT
GAUZE SPONGE 4X4 12PLY STRL (GAUZE/BANDAGES/DRESSINGS) ×3 IMPLANT
GAUZE XEROFORM 1X8 LF (GAUZE/BANDAGES/DRESSINGS) ×3 IMPLANT
GLOVE BIO SURGEON STRL SZ 6.5 (GLOVE) ×1 IMPLANT
GLOVE BIO SURGEON STRL SZ7.5 (GLOVE) IMPLANT
GLOVE BIO SURGEON STRL SZ8 (GLOVE) ×2 IMPLANT
GLOVE BIO SURGEONS STRL SZ 6.5 (GLOVE) ×1
GLOVE BIOGEL PI IND STRL 6.5 (GLOVE) IMPLANT
GLOVE BIOGEL PI IND STRL 7.5 (GLOVE) IMPLANT
GLOVE BIOGEL PI IND STRL 8 (GLOVE) IMPLANT
GLOVE BIOGEL PI INDICATOR 6.5 (GLOVE) ×2
GLOVE BIOGEL PI INDICATOR 7.5 (GLOVE) ×2
GLOVE BIOGEL PI INDICATOR 8 (GLOVE) ×2
GOWN STRL REUS W/ TWL LRG LVL3 (GOWN DISPOSABLE) ×1 IMPLANT
GOWN STRL REUS W/ TWL XL LVL3 (GOWN DISPOSABLE) ×1 IMPLANT
GOWN STRL REUS W/TWL LRG LVL3 (GOWN DISPOSABLE) ×9
GOWN STRL REUS W/TWL XL LVL3 (GOWN DISPOSABLE) ×3
HANDLE RATCHET STRL (INSTRUMENTS) ×2 IMPLANT
K-WIRE SURGICAL 1.6X102 (WIRE) ×2 IMPLANT
KIT INSTRUMENT DYNAFORCE PLATE (KITS) ×2 IMPLANT
NDL HYPO 25X1 1.5 SAFETY (NEEDLE) ×1 IMPLANT
NDL SAFETY ECLIPSE 18X1.5 (NEEDLE) IMPLANT
NEEDLE HYPO 18GX1.5 SHARP (NEEDLE)
NEEDLE HYPO 25X1 1.5 SAFETY (NEEDLE) ×3 IMPLANT
NS IRRIG 1000ML POUR BTL (IV SOLUTION) IMPLANT
PACK BASIN DAY SURGERY FS (CUSTOM PROCEDURE TRAY) ×3 IMPLANT
PADDING CAST ABS 4INX4YD NS (CAST SUPPLIES) ×4
PADDING CAST ABS COTTON 4X4 ST (CAST SUPPLIES) ×2 IMPLANT
PENCIL SMOKE EVACUATOR (MISCELLANEOUS) ×3 IMPLANT
PLATE MTP STANDARD 18 (Plate) ×3 IMPLANT
PLATE MTP STD 18 (Plate) IMPLANT
SCREW LOCK POLYAXIAL 3.0X12 (Screw) ×2 IMPLANT
SCREW LOCK POLYAXIAL 3.0X14 (Screw) ×4 IMPLANT
SCREW LOCK POLYAXIAL 3.0X16 (Screw) ×2 IMPLANT
SET CUP CONE REAMER 16 (INSTRUMENTS) ×2 IMPLANT
SPLINT FIBERGLASS 4X30 (CAST SUPPLIES) ×1 IMPLANT
STAPLE KTI HIMAX 18X18X14 (Staple) ×2 IMPLANT
STAPLER VISISTAT (STAPLE) ×2 IMPLANT
STAPLER VISISTAT 35W (STAPLE) IMPLANT
STOCKINETTE 6  STRL (DRAPES) ×3
STOCKINETTE 6 STRL (DRAPES) ×1 IMPLANT
SUT MNCRL AB 3-0 PS2 18 (SUTURE) ×3 IMPLANT
SUT MNCRL AB 4-0 PS2 18 (SUTURE) ×3 IMPLANT
SUT MON AB 5-0 PS2 18 (SUTURE) IMPLANT
SUT PROLENE 4 0 PS 2 18 (SUTURE) ×3 IMPLANT
SUT VIC AB 4-0 P2 18 (SUTURE) ×2 IMPLANT
SYR BULB EAR ULCER 3OZ GRN STR (SYRINGE) ×3 IMPLANT
SYR CONTROL 10ML LL (SYRINGE) IMPLANT
UNDERPAD 30X36 HEAVY ABSORB (UNDERPADS AND DIAPERS) ×3 IMPLANT

## 2020-10-09 NOTE — Transfer of Care (Signed)
   Last Vitals:  Vitals Value Taken Time  BP 144/87 10/09/20 1451  Temp 36.6 C 10/09/20 1451  Pulse 72 10/09/20 1455  Resp 20 10/09/20 1455  SpO2 100 % 10/09/20 1455  Vitals shown include unvalidated device data.  Last Pain:  Vitals:   10/09/20 1451  TempSrc:   PainSc: 7       Patients Stated Pain Goal: 7 (10/09/20 1217)  Immediate Anesthesia Transfer of Care Note  Patient: [name]  Procedure(s) Performed: Procedure(s) (LRB): ARTHRODESIS METATARSAL LEFT GREAT TOE; SECOND METATARSAL HEAD RESECTION , HARDWARE REMOVAL LEFT FOOT FOUR SCREWS (Left)  Patient Location: PACU  Anesthesia Type: General  Level of Consciousness: awake, alert  and oriented  Airway & Oxygen Therapy: Patient Spontanous Breathing and Patient connected to face mask oxygen  Post-op Assessment: Report given to PACU RN and Post -op Vital signs reviewed and stable  Post vital signs: Reviewed and stable  Complications: No apparent anesthesia complications

## 2020-10-09 NOTE — Brief Op Note (Signed)
10/09/2020  2:40 PM  PATIENT:  [name]  59 y.o. female  PRE-OPERATIVE DIAGNOSIS:  LEFT CAPSULITIS OF MPJ JOINTS  POST-OPERATIVE DIAGNOSIS:  LEFT CAPSULITIS OF MPJ JOINTS  PROCEDURE:  Procedure(s): ARTHRODESIS METATARSAL LEFT GREAT TOE; SECOND METATARSAL HEAD RESECTION , HARDWARE REMOVAL LEFT FOOT FOUR SCREWS (Left)  SURGEON:  Surgeon(s) and Role:    Edrick Kins, DPM - Primary  PHYSICIAN ASSISTANT:   ASSISTANTS: none   ANESTHESIA:   local and general  EBL:  5 mL   BLOOD ADMINISTERED:none  DRAINS: none   LOCAL MEDICATIONS USED:  MARCAINE    and LIDOCAINE   SPECIMEN:  No Specimen  DISPOSITION OF SPECIMEN:  N/A  COUNTS:  YES  TOURNIQUET:   Total Tourniquet Time Documented: Calf (Left) - 65 minutes Total: Calf (Left) - 65 minutes   DICTATION: .Viviann Spare Dictation  PLAN OF CARE: Discharge to home after PACU  PATIENT DISPOSITION:  PACU - hemodynamically stable.   Delay start of Pharmacological VTE agent (>24hrs) due to surgical blood loss or risk of bleeding: not applicable

## 2020-10-09 NOTE — Interval H&P Note (Signed)
History and Physical Interval Note:  10/09/2020 1:02 PM  [name]  has presented today for surgery, with the diagnosis of LEFT CAPSULITIS OF MPJ JOINTS.  The various methods of treatment have been discussed with the patient and family. After consideration of risks, benefits and other options for treatment, the patient has consented to  Procedure(s): ARTHRODESIS METATARSAL LEFT GREAT TOE; METATARSAL HEAD RECESSION; HARDWARE REMOVAL LEFT FOOT (Left) as a surgical intervention.  The patient's history has been reviewed, patient examined, no change in status, stable for surgery.  I have reviewed the patient's chart and labs.  Questions were answered to the patient's satisfaction.     Edrick Kins

## 2020-10-09 NOTE — Anesthesia Postprocedure Evaluation (Signed)
Anesthesia Post Note  Patient: [name]  Procedure(s) Performed: ARTHRODESIS METATARSAL LEFT GREAT TOE; SECOND METATARSAL HEAD RESECTION , HARDWARE REMOVAL LEFT FOOT FOUR SCREWS (Left Foot)     Patient location during evaluation: PACU Anesthesia Type: General Level of consciousness: awake and alert and oriented Pain management: pain level controlled Vital Signs Assessment: post-procedure vital signs reviewed and stable Respiratory status: spontaneous breathing, nonlabored ventilation and respiratory function stable Cardiovascular status: blood pressure returned to baseline and stable Postop Assessment: no apparent nausea or vomiting Anesthetic complications: no   No complications documented.  Last Vitals:  Vitals:   10/09/20 1500 10/09/20 1515  BP: (!) 157/96 (!) 148/88  Pulse: 77 69  Resp: (!) 21 20  Temp:    SpO2: 100% 99%    Last Pain:  Vitals:   10/09/20 1515  TempSrc:   PainSc: 5                  Jonnette Nuon A.

## 2020-10-09 NOTE — Discharge Instructions (Addendum)
Keep dressings clean and dry until next follow up appt in office. Strict NON-WEIGHT BEARING in the CAM boot. Please see additional discharge instructions on paper provided.   Regional Anesthesia Blocks  1. Numbness or the inability to move the "blocked" extremity may last from 3-48 hours after placement. The length of time depends on the medication injected and your individual response to the medication. If the numbness is not going away after 48 hours, call your surgeon.  2. The extremity that is blocked will need to be protected until the numbness is gone and the  Strength has returned. Because you cannot feel it, you will need to take extra care to avoid injury. Because it may be weak, you may have difficulty moving it or using it. You may not know what position it is in without looking at it while the block is in effect.  3. For blocks in the legs and feet, returning to weight bearing and walking needs to be done carefully. You will need to wait until the numbness is entirely gone and the strength has returned. You should be able to move your leg and foot normally before you try and bear weight or walk. You will need someone to be with you when you first try to ensure you do not fall and possibly risk injury.  4. Bruising and tenderness at the needle site are common side effects and will resolve in a few days.  5. Persistent numbness or new problems with movement should be communicated to the surgeon or the  [facility] [phone]).    NO ADVIL, ALEVE, MOTRIN, IBUPROFEN UNTIL 845 PM TODAY    Post Anesthesia Home Care Instructions  Activity: Get plenty of rest for the remainder of the day. A responsible adult should stay with you for 24 hours following the procedure.  For the next 24 hours, DO NOT: -Drive a car -Paediatric nurse -Drink alcoholic beverages -Take any medication unless instructed by your physician -Make any legal decisions or sign important  papers.  Meals: Start with liquid foods such as gelatin or soup. Progress to regular foods as tolerated. Avoid greasy, spicy, heavy foods. If nausea and/or vomiting occur, drink only clear liquids until the nausea and/or vomiting subsides. Call your physician if vomiting continues.  Special Instructions/Symptoms: Your throat may feel dry or sore from the anesthesia or the breathing tube placed in your throat during surgery. If this causes discomfort, gargle with warm salt water. The discomfort should disappear within 24 hours.  If you had a scopolamine patch placed behind your ear for the management of post- operative nausea and/or vomiting:  1. The medication in the patch is effective for 72 hours, after which it should be removed.  Wrap patch in a tissue and discard in the trash. Wash hands thoroughly with soap and water. 2. You may remove the patch earlier than 72 hours if you experience unpleasant side effects which may include dry mouth, dizziness or visual disturbances. 3. Avoid touching the patch. Wash your hands with soap and water after contact with the patch.

## 2020-10-09 NOTE — OR Nursing (Signed)
HARDWARE REMOVED , THREE SCREWS WASHED ON STERILE FIELD AND SENT WITH PATIENT

## 2020-10-09 NOTE — Anesthesia Preprocedure Evaluation (Signed)
Anesthesia Evaluation  Patient identified by MRN, date of birth, ID band Patient awake    Reviewed: Allergy & Precautions, NPO status , Patient's Chart, lab work & pertinent test results  Airway Mallampati: II  TM Distance: >3 FB Neck ROM: Full    Dental no notable dental hx.    Pulmonary Current Smoker,    Pulmonary exam normal breath sounds clear to auscultation       Cardiovascular hypertension, + CAD, + Past MI, + Cardiac Stents and +CHF  Normal cardiovascular exam Rhythm:Regular Rate:Normal     Neuro/Psych negative neurological ROS  negative psych ROS   GI/Hepatic negative GI ROS, Neg liver ROS,   Endo/Other  negative endocrine ROS  Renal/GU negative Renal ROS  negative genitourinary   Musculoskeletal negative musculoskeletal ROS (+)   Abdominal   Peds negative pediatric ROS (+)  Hematology negative hematology ROS (+)   Anesthesia Other Findings   Reproductive/Obstetrics negative OB ROS                             Anesthesia Physical Anesthesia Plan  ASA: III  Anesthesia Plan: General   Post-op Pain Management:    Induction: Intravenous  PONV Risk Score and Plan: 2 and Ondansetron, Dexamethasone and Treatment may vary due to age or medical condition  Airway Management Planned: LMA  Additional Equipment:   Intra-op Plan:   Post-operative Plan: Extubation in OR  Informed Consent: I have reviewed the patients History and Physical, chart, labs and discussed the procedure including the risks, benefits and alternatives for the proposed anesthesia with the patient or authorized representative who has indicated his/her understanding and acceptance.     Dental advisory given  Plan Discussed with: CRNA and Surgeon  Anesthesia Plan Comments:         Anesthesia Quick Evaluation

## 2020-10-09 NOTE — Anesthesia Procedure Notes (Signed)
Procedure Name: LMA Insertion Date/Time: 10/09/2020 1:15 PM Performed by: Justice Rocher, CRNA Pre-anesthesia Checklist: Patient identified, Emergency Drugs available, Suction available, Patient being monitored and Timeout performed Patient Re-evaluated:Patient Re-evaluated prior to induction Oxygen Delivery Method: Circle system utilized Preoxygenation: Pre-oxygenation with 100% oxygen Induction Type: IV induction Ventilation: Mask ventilation without difficulty LMA: LMA inserted LMA Size: 4.0 Number of attempts: 1 Airway Equipment and Method: Bite block Placement Confirmation: positive ETCO2,  breath sounds checked- equal and bilateral and CO2 detector Tube secured with: Tape Dental Injury: Teeth and Oropharynx as per pre-operative assessment

## 2020-10-10 ENCOUNTER — Encounter (HOSPITAL_BASED_OUTPATIENT_CLINIC_OR_DEPARTMENT_OTHER): Payer: Self-pay | Admitting: Podiatry

## 2020-10-10 DIAGNOSIS — I220 Subsequent ST elevation (STEMI) myocardial infarction of anterior wall: Secondary | ICD-10-CM | POA: Diagnosis not present

## 2020-10-11 DIAGNOSIS — I220 Subsequent ST elevation (STEMI) myocardial infarction of anterior wall: Secondary | ICD-10-CM | POA: Diagnosis not present

## 2020-10-12 DIAGNOSIS — I220 Subsequent ST elevation (STEMI) myocardial infarction of anterior wall: Secondary | ICD-10-CM | POA: Diagnosis not present

## 2020-10-13 ENCOUNTER — Other Ambulatory Visit: Payer: Self-pay | Admitting: Oncology

## 2020-10-13 DIAGNOSIS — I220 Subsequent ST elevation (STEMI) myocardial infarction of anterior wall: Secondary | ICD-10-CM | POA: Diagnosis not present

## 2020-10-13 MED ORDER — METHADONE HCL 5 MG PO TABS
15.0000 mg | ORAL_TABLET | Freq: Three times a day (TID) | ORAL | 0 refills | Status: DC
Start: 2020-10-13 — End: 2020-11-05

## 2020-10-13 NOTE — Op Note (Signed)
OPERATIVE REPORT Patient name: [name] MRN: [id number] DOB: 09/28/1961  DOS: 10/09/2020  Preop Dx: Painful orthopedic hardware left.  DJD first MTPJ left.  Second MTPJ capsulitis/chronic pain left Postop Dx: same  Procedure:  1.  Removal of hardware x4 left 2.  First MTPJ arthrodesis left 3.  Second metatarsal head resection left  Surgeon: Edrick Kins DPM  Anesthesia: 50-50 mixture of 2% lidocaine plain with 0.5% Marcaine plain totaling 20 mL infiltrated in the patient's left lower extremity  Hemostasis: Ankle tourniquet inflated to a pressure of 269mmHg after esmarch exsanguination   EBL: Minimal mL Materials: Crossroads first MTPJ plate with integrated compression staple and respective screws x4 Injectables: None Pathology: None  Condition: The patient tolerated the procedure and anesthesia well. No complications noted or reported   Justification for procedure: The patient is a 59 y.o. female who presents today for surgical correction of pain to the first and second MTPJ's of the left foot. All conservative modalities of been unsuccessful in providing any sort of satisfactory alleviation of symptoms with the patient. The patient was told benefits as well as possible side effects of the surgery. The patient consented for surgical correction. The patient consent form was reviewed. All patient questions were answered. No guarantees were expressed or implied. The patient and the surgeon boson the patient consent form with the witness present and placed in the patient's chart.   Procedure in Detail: The patient was brought to the operating room, placed in the operating table in the supine position at which time an aseptic scrub and drape were performed about the patient's respective lower extremity after anesthesia was induced as described above. Attention was then directed to the surgical area where procedure number one commenced.  Procedure #1: Removal of orthopedic  screws x4 left A 5 cm linear longitudinal skin incision was planned and made overlying the first MTPJ of the left foot.  Incision was carried down to the level of bone with care taken to cut clamp ligate and retract away all small neurovascular structures traversing the incision site as well as identification of the EHL tendon which was identified and retracted and preserved.  Capsulotomy was performed and capsular and periosteal tissue was sharply reflected away from the underlying bone to expose the symptomatic screws.  2 of the screws identified were within the first metatarsal and the third screw was within the proximal phalanx.  The screws were removed in toto and placed in a sterile specimen container.  Attention was then directed to the second MTPJ of the surgical foot where another incision was carried down to the level of bone with care taken to cut clamp ligate and retract away all small neurovascular structures traversing the incision site.  The EDL tendon was identified and retracted and preserved.  Periosteal tissue was reflected away and identification of that orthopedic screw within the surgical neck of the second metatarsal was identified the screw was removed and placed in a sterile specimen container.  Verified by intraoperative x-ray fluoroscopy.  Procedure #2: First MTPJ arthrodesis left Within the incision site previously mentioned procedure #1, the first MTPJ was identified and additional soft tissue dissection and capsulotomy was performed to allow good exposure of the metatarsal head as well as the base of the proximal phalanx.  After good exposure a cup and cone reamer was utilized to resect away the cartilaginous surfaces of the first MTPJ.  A 0.062 inch K wire was utilized to perforate and create subchondral drilling  of the head of the first metatarsal as well as the base the proximal phalanx to allow for better arthrodesis and optimal fusion.  Irrigation was utilized and temporary  fixation and alignment of the first ray was achieved using a 0.062 K wire. The Crossroads plate and integrated compression staple was then inserted and verified by intraoperative x-ray fluoroscopy.  The orthopedic hardware was inserted in standard AO fashion and as the surgical technique guide recommends.  Again this was verified by intraoperative x-ray fluoroscopy which was satisfactory.  Routine primary closure was then achieved using Vicryl suture and stainless steel skin staples.  Procedure #3: Second metatarsal head resection left Within the incision site previously mentioned in procedure #1 overlying the second MTPJ, additional soft tissue dissection was utilized to allow for good exposure of the head of the second metatarsal.  The EDL tendon was identified and transverse tenotomy was performed.  The head of the second metatarsal was identified and an osteotomy was performed at the surgical phalangeal neck.  The distal head of the metatarsal was removed in toto.  Verified by intraoperative x-ray fluoroscopy.  Irrigation was utilized and primary closure was again achieved using Vicryl suture and stainless steel skin staples.  Dry sterile compressive dressings were then applied to all previously mentioned incision sites about the patient's lower extremity. The tourniquet which was used for hemostasis was deflated. All normal neurovascular responses including pink color and warmth returned all the digits of patient's lower extremity.  The patient was then transferred from the operating room to the recovery room having tolerated the procedure and anesthesia well. All vital signs are stable. After a brief stay in the recovery room the patient was discharged with adequate prescriptions for analgesia. Verbal as well as written instructions were provided for the patient regarding wound care. The patient is to keep the dressings clean dry and intact until they are to follow surgeon Dr. Daylene Katayama in the office  upon discharge.   Edrick Kins, DPM [facility]  Dr. Edrick Kins, DPM    [address], [place] [address]                Office [phone]  Fax [phone]

## 2020-10-14 ENCOUNTER — Telehealth: Payer: Self-pay

## 2020-10-14 ENCOUNTER — Telehealth: Payer: Self-pay | Admitting: Podiatry

## 2020-10-14 DIAGNOSIS — I220 Subsequent ST elevation (STEMI) myocardial infarction of anterior wall: Secondary | ICD-10-CM | POA: Diagnosis not present

## 2020-10-14 NOTE — Telephone Encounter (Signed)
Pt called requesting refill for Methadone to [facility]. This LPN returned pt's call and informed her that the Rx was sent in 10/13/20. Pt verbalized thanks and understanding.

## 2020-10-14 NOTE — Telephone Encounter (Signed)
Dr. [name] called from [facility] stating he is also prescribing narcotics. He would like to know if you plan to continue prescribing narcotics. His call back number is [phone]. Please advise.

## 2020-10-15 ENCOUNTER — Ambulatory Visit (INDEPENDENT_AMBULATORY_CARE_PROVIDER_SITE_OTHER): Payer: Medicaid Other | Admitting: Podiatry

## 2020-10-15 ENCOUNTER — Other Ambulatory Visit: Payer: Self-pay

## 2020-10-15 ENCOUNTER — Ambulatory Visit (INDEPENDENT_AMBULATORY_CARE_PROVIDER_SITE_OTHER): Payer: Medicaid Other

## 2020-10-15 VITALS — Temp 96.4°F

## 2020-10-15 DIAGNOSIS — M205X2 Other deformities of toe(s) (acquired), left foot: Secondary | ICD-10-CM

## 2020-10-15 DIAGNOSIS — M19072 Primary osteoarthritis, left ankle and foot: Secondary | ICD-10-CM

## 2020-10-15 DIAGNOSIS — I220 Subsequent ST elevation (STEMI) myocardial infarction of anterior wall: Secondary | ICD-10-CM | POA: Diagnosis not present

## 2020-10-15 DIAGNOSIS — Z9889 Other specified postprocedural states: Secondary | ICD-10-CM

## 2020-10-15 DIAGNOSIS — M7752 Other enthesopathy of left foot: Secondary | ICD-10-CM

## 2020-10-15 MED ORDER — OXYCODONE-ACETAMINOPHEN 5-325 MG PO TABS: 1.0000 | ORAL_TABLET | Freq: Four times a day (QID) | ORAL | 0 refills | Status: DC | PRN

## 2020-10-15 MED FILL — METHADONE HCL 5 MG TABS: 5 | 30 days supply | Qty: 270 | Fill #0
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
## 2020-10-16 ENCOUNTER — Ambulatory Visit: Payer: Medicaid Other | Admitting: Orthopedic Surgery

## 2020-10-16 ENCOUNTER — Other Ambulatory Visit: Payer: Self-pay | Admitting: Oncology

## 2020-10-16 DIAGNOSIS — I220 Subsequent ST elevation (STEMI) myocardial infarction of anterior wall: Secondary | ICD-10-CM | POA: Diagnosis not present

## 2020-10-16 NOTE — Progress Notes (Unsigned)
[name] had been receiving some extra narcotics because of her foot problems. I discussed with her podiatrist. His reply is below. We will resume methadone as before  Hi Dr. [name],  I'm sorry for the late repy... I just saw [name] today in office. Our plan is to prescribe one last script for Percocet 5/325 q6h #30 today, and this will be the end of her acute postoperative pain management. She may resume her methadone in one week after this prescription ends. Thanks so much for reaching out and please let me know if you need anything else on my end.  Daylene Katayama ----- Message ----- From: Chauncey Cruel, MD Sent: 10/13/2020   3:06 PM EDT To: Edrick Kins, DPM

## 2020-10-17 DIAGNOSIS — I220 Subsequent ST elevation (STEMI) myocardial infarction of anterior wall: Secondary | ICD-10-CM | POA: Diagnosis not present

## 2020-10-18 DIAGNOSIS — I220 Subsequent ST elevation (STEMI) myocardial infarction of anterior wall: Secondary | ICD-10-CM | POA: Diagnosis not present

## 2020-10-19 DIAGNOSIS — I220 Subsequent ST elevation (STEMI) myocardial infarction of anterior wall: Secondary | ICD-10-CM | POA: Diagnosis not present

## 2020-10-22 ENCOUNTER — Encounter: Payer: Medicaid Other | Admitting: Podiatry

## 2020-10-22 ENCOUNTER — Telehealth: Payer: Self-pay | Admitting: Podiatry

## 2020-10-22 NOTE — Telephone Encounter (Signed)
The patient took a nap and overslept for her appointment. I have her r/s for 11/8. She wanted to know if she needed to change her bandage.

## 2020-10-23 ENCOUNTER — Ambulatory Visit: Payer: Medicaid Other | Admitting: Orthopedic Surgery

## 2020-10-27 ENCOUNTER — Encounter: Payer: Medicaid Other | Admitting: Podiatry

## 2020-10-28 ENCOUNTER — Ambulatory Visit: Payer: Medicaid Other | Admitting: Orthopedic Surgery

## 2020-10-29 ENCOUNTER — Other Ambulatory Visit: Payer: Self-pay

## 2020-10-29 ENCOUNTER — Ambulatory Visit (INDEPENDENT_AMBULATORY_CARE_PROVIDER_SITE_OTHER): Payer: Medicaid Other | Admitting: Podiatry

## 2020-10-29 DIAGNOSIS — Z9889 Other specified postprocedural states: Secondary | ICD-10-CM | POA: Diagnosis not present

## 2020-10-29 DIAGNOSIS — M205X2 Other deformities of toe(s) (acquired), left foot: Secondary | ICD-10-CM

## 2020-10-29 MED ORDER — DOXYCYCLINE HYCLATE 100 MG PO TABS: 100.0000 mg | ORAL_TABLET | Freq: Two times a day (BID) | ORAL | 0 refills | Status: DC
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
## 2020-11-02 NOTE — Progress Notes (Signed)
    SUBJECTIVE:   CHIEF COMPLAINT / HPI:   [name] is a 59 year old female who presents to meet new PCP and for neck mass   Lump in neck Pt endorses left sided neck lump for the last year the last year which is growing in size. It is not painful but it is now bothering her. Has hx of breast cancer. Denies tooth pain, fevers, weight loss, night sweats, changing of voice, dysphagia or dyspnea. Saw Dr Shan Levans earlier this year for this who checked thyroid function which was normal.  Health maintenance  Covid vaccine: received both shots Flu vaccine: received   PERTINENT  PMH / PSH: Coronary artery disease, thrombocytosis, osteoarthritis  OBJECTIVE:   BP 125/60   Pulse 83   Ht 5\' 5"  (1.651 m)   Wt 235 lb 6.4 oz (106.8 kg)   LMP 08/07/2011   SpO2 96%   BMI 39.17 kg/m    General: Alert, no acute distress HEENT: left sided neck swelling, no obvious masses on palpation, non-tender, no thyromegaly or lymphadenopathy Cardio: Normal S1 and S2, RRR, no r/m/g Pulm: CTAB, normal work of breathing Abdomen: Bowel sounds normal. Abdomen soft and non-tender.  Extremities: No peripheral edema.  Neuro: Cranial nerves grossly intact  ASSESSMENT/PLAN:   Left facial swelling Pt has > 6 month hx of left sided neck swelling which is growing in size. TSH obtained in March wnl. On exam the swelling feels more like edema vs a mass. Considered salivary gland/thyroid neoplasm and lymphoma as differentials to rule out given that she is a smoker. It is reassuring that she has had no other red flag symptoms for cancer. Recommended CT soft tissue neck and close follow up.     [name], MD PGY-2

## 2020-11-02 NOTE — Patient Instructions (Addendum)
  Thank you for coming to see me today. It was a pleasure. I am not sure what the mass in your neck is, it could be coming from your salivary gland. I have placed an order for  A CT neck. We will contact you once your insurance has cleared and you can have it done.   Please follow-up with 2 weeks for follow up for diabetes check up.  If you have any questions or concerns, please do not hesitate to call the office at [phone].  If you develop fevers>100.5, shortness of breath, chest pain, palpitations, dizziness, abdominal pain, nausea, vomiting, diarrhea or cannot eat or drink then please go to the ER immediately.    Best wishes,   Dr Posey Pronto

## 2020-11-03 ENCOUNTER — Encounter: Payer: Self-pay | Admitting: Licensed Clinical Social Worker

## 2020-11-03 DIAGNOSIS — R69 Illness, unspecified: Secondary | ICD-10-CM | POA: Diagnosis not present

## 2020-11-03 NOTE — Progress Notes (Signed)
[facility]   Patient brought in [facility] for [employer]. CSW completed supporting letter and submitted application. Mailed originals back to patient as requested.   Christeen Douglas, LCSW

## 2020-11-04 ENCOUNTER — Ambulatory Visit (INDEPENDENT_AMBULATORY_CARE_PROVIDER_SITE_OTHER): Payer: Medicaid Other | Admitting: Family Medicine

## 2020-11-04 ENCOUNTER — Encounter: Payer: Self-pay | Admitting: Family Medicine

## 2020-11-04 ENCOUNTER — Other Ambulatory Visit: Payer: Self-pay

## 2020-11-04 VITALS — BP 125/60 | HR 83 | Ht 65.0 in | Wt 235.4 lb

## 2020-11-04 DIAGNOSIS — E1169 Type 2 diabetes mellitus with other specified complication: Secondary | ICD-10-CM | POA: Diagnosis not present

## 2020-11-04 DIAGNOSIS — R220 Localized swelling, mass and lump, head: Secondary | ICD-10-CM

## 2020-11-04 DIAGNOSIS — R221 Localized swelling, mass and lump, neck: Secondary | ICD-10-CM

## 2020-11-04 DIAGNOSIS — E785 Hyperlipidemia, unspecified: Secondary | ICD-10-CM

## 2020-11-04 DIAGNOSIS — R69 Illness, unspecified: Secondary | ICD-10-CM | POA: Diagnosis not present

## 2020-11-04 DIAGNOSIS — Z131 Encounter for screening for diabetes mellitus: Secondary | ICD-10-CM | POA: Diagnosis not present

## 2020-11-05 ENCOUNTER — Encounter: Payer: Medicaid Other | Admitting: Podiatry

## 2020-11-05 ENCOUNTER — Other Ambulatory Visit: Payer: Self-pay | Admitting: Oncology

## 2020-11-05 DIAGNOSIS — R69 Illness, unspecified: Secondary | ICD-10-CM | POA: Diagnosis not present

## 2020-11-05 DIAGNOSIS — R221 Localized swelling, mass and lump, neck: Secondary | ICD-10-CM | POA: Insufficient documentation

## 2020-11-05 NOTE — Telephone Encounter (Signed)
Dr. [facility], For your approval or refusal. Okey Zelek M Christan Ciccarelli, RN  

## 2020-11-05 NOTE — Assessment & Plan Note (Addendum)
Pt has > 6 month hx of left sided neck swelling which is growing in size. TSH obtained in March wnl. On exam the swelling feels more like edema vs a mass. Considered salivary gland/thyroid neoplasm and lymphoma as differentials to rule out given that she is a smoker. It is reassuring that she has had no other red flag symptoms for cancer. Recommended CT soft tissue neck and close follow up.

## 2020-11-06 ENCOUNTER — Telehealth: Payer: Self-pay

## 2020-11-06 ENCOUNTER — Encounter: Payer: Self-pay | Admitting: Orthopedic Surgery

## 2020-11-06 ENCOUNTER — Ambulatory Visit: Payer: Self-pay

## 2020-11-06 ENCOUNTER — Telehealth: Payer: Self-pay | Admitting: *Deleted

## 2020-11-06 ENCOUNTER — Ambulatory Visit (INDEPENDENT_AMBULATORY_CARE_PROVIDER_SITE_OTHER): Payer: Medicaid Other | Admitting: Physician Assistant

## 2020-11-06 ENCOUNTER — Other Ambulatory Visit: Payer: Self-pay

## 2020-11-06 DIAGNOSIS — R69 Illness, unspecified: Secondary | ICD-10-CM | POA: Diagnosis not present

## 2020-11-06 DIAGNOSIS — Z96651 Presence of right artificial knee joint: Secondary | ICD-10-CM
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
## 2020-11-06 NOTE — Telephone Encounter (Signed)
[name] is requesting a refill of methadone. She will need it for pick up on 11/12/20

## 2020-11-06 NOTE — Telephone Encounter (Signed)
Pt called today stating that she is uncomfortable with removing her unna boot. According to the notes after one week the patient cant remove it. Pt would like for our clinical staff to remove it at her next appointment. I did advise the pt is she starts to experience any numbness or tingling of the area, cool to touch she needs to removed the unna boot. I told the pt if she had additional please give [name] a call back so we can get her seen sooner.

## 2020-11-07 ENCOUNTER — Other Ambulatory Visit: Payer: Self-pay | Admitting: Oncology

## 2020-11-07 DIAGNOSIS — R69 Illness, unspecified: Secondary | ICD-10-CM | POA: Diagnosis not present

## 2020-11-07 MED ORDER — METHADONE HCL 5 MG PO TABS: ORAL_TABLET | ORAL | 0 refills | Status: DC

## 2020-11-08 DIAGNOSIS — R69 Illness, unspecified: Secondary | ICD-10-CM | POA: Diagnosis not present

## 2020-11-09 DIAGNOSIS — R69 Illness, unspecified: Secondary | ICD-10-CM | POA: Diagnosis not present

## 2020-11-12 ENCOUNTER — Other Ambulatory Visit: Payer: Self-pay

## 2020-11-12 ENCOUNTER — Encounter: Payer: Self-pay | Admitting: Podiatry

## 2020-11-12 ENCOUNTER — Ambulatory Visit (INDEPENDENT_AMBULATORY_CARE_PROVIDER_SITE_OTHER): Payer: Medicaid Other

## 2020-11-12 ENCOUNTER — Ambulatory Visit (INDEPENDENT_AMBULATORY_CARE_PROVIDER_SITE_OTHER): Payer: Medicaid Other | Admitting: Podiatry

## 2020-11-12 DIAGNOSIS — M205X2 Other deformities of toe(s) (acquired), left foot: Secondary | ICD-10-CM | POA: Diagnosis not present

## 2020-11-12 DIAGNOSIS — T8484XA Pain due to internal orthopedic prosthetic devices, implants and grafts, initial encounter: Secondary | ICD-10-CM

## 2020-11-12 DIAGNOSIS — M216X2 Other acquired deformities of left foot: Secondary | ICD-10-CM | POA: Diagnosis not present

## 2020-11-12 DIAGNOSIS — M21962 Unspecified acquired deformity of left lower leg: Secondary | ICD-10-CM | POA: Diagnosis not present

## 2020-11-12 DIAGNOSIS — R600 Localized edema: Secondary | ICD-10-CM

## 2020-11-12 DIAGNOSIS — Z9889 Other specified postprocedural states: Secondary | ICD-10-CM

## 2020-11-12 MED FILL — METHADONE HCL 5 MG TABS: 5 | 30 days supply | Qty: 270 | Fill #0

## 2020-11-12 NOTE — Patient Instructions (Signed)
Hi [name], sorry we missed you today  - as a part of your Medicaid benefit, you are eligible for care management and care coordination services at no cost or copay. I was unable to reach you by phone today but would be happy to help you with your health related needs. Please feel free to call me at [phone].  A member of the Managed Medicaid care management team will reach out to you again over the next 7-14 days.  Aida Raider RN, BSN [place]  [facility] Managed Medicaid High Risk [phone].

## 2020-11-12 NOTE — Patient Outreach (Signed)
Care Coordination  11/12/2020  [name] [birth year] [id number]  An unsuccessful telephone outreach was attempted today. The patient was referred to the case management team for assistance with care management and care coordination.   Follow Up Plan: The Managed Medicaid care management team will reach out to the patient again over the next 7-14 days.   Aida Raider RN, BSN [place]  [facility] - Managed Medicaid High Risk [phone].

## 2020-11-18 ENCOUNTER — Other Ambulatory Visit: Payer: Self-pay

## 2020-11-18 ENCOUNTER — Telehealth: Payer: Medicaid Other | Admitting: Plastic Surgery

## 2020-11-18 DIAGNOSIS — I890 Lymphedema, not elsewhere classified: Secondary | ICD-10-CM

## 2020-11-18 DIAGNOSIS — C50412 Malignant neoplasm of upper-outer quadrant of left female breast: Secondary | ICD-10-CM

## 2020-11-18 NOTE — Progress Notes (Signed)
Pt called requesting new referral for PT d/t lymphedema to LUE. Referral entered. This LPN attempted to call pt to inform her, but VM box is full.

## 2020-11-19 ENCOUNTER — Encounter: Payer: Medicaid Other | Admitting: Podiatry
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
## 2020-11-23 NOTE — Progress Notes (Deleted)
     SUBJECTIVE:   CHIEF COMPLAINT / HPI:   [name] is a 59 y.o. female presents with **    Hypertension: Patient's current antihypertensive  medications include: Amlodipine 5mg . Compliant with medications and tolerating well without side effects.  Checking BP at home with readings between *** and **.  Denies any SOB, CP, vision changes, LE edema, medication SEs, or symptoms of hypotension.   Most recent creatinine trend:  Lab Results  Component Value Date   CREATININE 0.90 10/09/2020   CREATININE 0.83 09/11/2020   CREATININE 0.78 06/10/2020     Patient {HAS HAS ELT:53202} had a BMP in the past 1 year.     Diabetes Patient's current diabetic medications include***. Tolerating well without side effects.  Patient endorses compliance with these medications. CBG readings averaging in the *** range.  Patient's last A1c was 5.8 Lab Results  Component Value Date   HGBA1C 5.8 09/01/2016   HGBA1C 6.4 (H) 11/18/2015   in 2017.  Current A1c today is***.  Denies abdominal pain, blurred vision, polyuria, polydipsia, hypoglycemia ***. Patient states they understand that diet and exercise can help with her diabetes.***.    Last Microalbumin, LDL, Creatinine: Lab Results  Component Value Date   MICROALBUR 0.51 07/31/2009   LDLCALC 51 11/13/2019   CREATININE 0.90 10/09/2020    HLD Patient is currently taking Atorvastatin 40mg . Endorses compliance and tolerating well without side effects. Denies RUQ pain or myalgias.        PERTINENT  PMH / PSH: ***  OBJECTIVE:   LMP 08/07/2011    General: Alert, no acute distress Cardio: Normal S1 and S2, RRR, no r/m/g Pulm: CTAB, normal work of breathing Abdomen: Bowel sounds normal. Abdomen soft and non-tender.  Extremities: No peripheral edema.  Neuro: Cranial nerves grossly intact   ASSESSMENT/PLAN:   No problem-specific Assessment & Plan notes found for this encounter.     [name], MD [place]

## 2020-11-24 ENCOUNTER — Ambulatory Visit: Payer: Medicaid Other | Admitting: Family Medicine

## 2020-11-25 ENCOUNTER — Other Ambulatory Visit: Payer: Self-pay | Admitting: Obstetrics and Gynecology

## 2020-11-25 NOTE — Patient Outreach (Signed)
Care Coordination  11/25/2020  [name] 30-Sep-1961 [id number]    Medicaid Managed Care   Unsuccessful Outreach Note  11/25/2020 Name: [name] MRN: [id number] DOB: [birth year]  Referred by: [name], MD Reason for referral : High Risk Managed Medicaid (follow up-initial)   A second unsuccessful telephone outreach was attempted today. The patient was referred to the case management team for assistance with care management and care coordination.   Follow Up Plan: RNCM will follow up with patient within the next 7-14 days.  Aida Raider RN, BSN [place]  [facility] - Managed Medicaid High Risk [phone].

## 2020-11-25 NOTE — Patient Instructions (Signed)
Hi Ms. Boyan, sorry we missed you today- as a part of your Medicaid benefit, you are eligible for care management and care coordination services at no cost or copay. I was unable to reach you by phone today but would be happy to help you with your health related needs. Please feel free to call me at [phone].  A member of the Managed Medicaid care management team will reach out to you again over the next 7-14 days.   Aida Raider RN, BSN [place]  [facility] - Managed Medicaid High Risk [phone].

## 2020-11-26 ENCOUNTER — Other Ambulatory Visit: Payer: Self-pay

## 2020-11-26 ENCOUNTER — Ambulatory Visit (INDEPENDENT_AMBULATORY_CARE_PROVIDER_SITE_OTHER): Payer: Medicaid Other | Admitting: Podiatry

## 2020-11-26 DIAGNOSIS — M205X2 Other deformities of toe(s) (acquired), left foot: Secondary | ICD-10-CM

## 2020-11-26 DIAGNOSIS — T8484XA Pain due to internal orthopedic prosthetic devices, implants and grafts, initial encounter: Secondary | ICD-10-CM

## 2020-11-26 DIAGNOSIS — Z9889 Other specified postprocedural states: Secondary | ICD-10-CM
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
## 2020-11-28 ENCOUNTER — Other Ambulatory Visit: Payer: Self-pay

## 2020-11-28 ENCOUNTER — Ambulatory Visit: Payer: Medicaid Other | Attending: Orthopedic Surgery | Admitting: Physical Therapy

## 2020-11-28 DIAGNOSIS — R262 Difficulty in walking, not elsewhere classified: Secondary | ICD-10-CM | POA: Insufficient documentation

## 2020-11-28 DIAGNOSIS — I972 Postmastectomy lymphedema syndrome: Secondary | ICD-10-CM | POA: Diagnosis present

## 2020-11-28 DIAGNOSIS — M6281 Muscle weakness (generalized): Secondary | ICD-10-CM | POA: Insufficient documentation

## 2020-11-28 DIAGNOSIS — R293 Abnormal posture: Secondary | ICD-10-CM | POA: Diagnosis present
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
## 2020-12-01 ENCOUNTER — Encounter: Payer: Medicaid Other | Admitting: Podiatry

## 2020-12-05 ENCOUNTER — Other Ambulatory Visit: Payer: Medicaid Other

## 2020-12-08 ENCOUNTER — Other Ambulatory Visit: Payer: Self-pay | Admitting: Obstetrics and Gynecology

## 2020-12-08 ENCOUNTER — Other Ambulatory Visit: Payer: Self-pay | Admitting: Oncology

## 2020-12-08 ENCOUNTER — Other Ambulatory Visit: Payer: Self-pay

## 2020-12-08 ENCOUNTER — Ambulatory Visit: Payer: Medicaid Other | Admitting: Family Medicine

## 2020-12-08 NOTE — Patient Outreach (Signed)
Care Coordination  12/08/2020  CAMREE WIGINGTON 1961-07-29 [id number]   RNCM called patient at scheduled time-patient stated she did not feel well and was trying to rest.  She asked that I call back another day. Appointment rescheduled.  Aida Raider RN, BSN [place]  [facility] - Managed Medicaid High Risk [phone].

## 2020-12-08 NOTE — Telephone Encounter (Signed)
Dr. [name], Sending to your for approval or refusal. Gardiner Rhyme, RN

## 2020-12-09 ENCOUNTER — Other Ambulatory Visit: Payer: Medicaid Other

## 2020-12-09 ENCOUNTER — Telehealth (INDEPENDENT_AMBULATORY_CARE_PROVIDER_SITE_OTHER): Payer: Medicaid Other | Admitting: Plastic Surgery

## 2020-12-09 ENCOUNTER — Other Ambulatory Visit: Payer: Self-pay

## 2020-12-09 DIAGNOSIS — Z9013 Acquired absence of bilateral breasts and nipples: Secondary | ICD-10-CM | POA: Diagnosis not present

## 2020-12-09 NOTE — Progress Notes (Signed)
   Subjective:    Patient ID: [name], female    DOB: 06/28/1961, 59 y.o.   MRN: [id number]  The patient is a 59 year old black female joining me by phone to discuss breast reconstruction.  She had bilateral mastectomies in 2012 by Dr. Mikael Spray left breast radiation and sentinel lymph node removal on the left side.  She does wear sleeve to help with her lymphedema.  Chest and weighs 227 pounds.  She wears a DD bra prosthetic.  This year she had knee and foot surgery this year and is doing much better. She is planning on starting water aerobics in January.  She has reduced her smoking from 7 cigarettes to 2 cigarettes/day.   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  This seems to be stable.  The surgery is slow down her weight loss program.       Review of Systems  Constitutional: Negative.  Negative for appetite change.  HENT: Negative.   Eyes: Negative.   Respiratory: Negative.  Negative for shortness of breath.   Cardiovascular: Negative.   Gastrointestinal: Negative.  Negative for abdominal pain.  Endocrine: Negative.   Genitourinary: Negative.   Musculoskeletal: Negative.  Negative for gait problem.  Neurological: Negative.   Hematological: Negative.        Objective:   Physical Exam       Assessment & Plan:     ICD-10-CM   1. Acquired absence of both breasts  Z90.13     The patient is going to get back on a regiment for weight loss.  She will give [name] a call once she has been able to reduce her weight by approximately 20 pounds.  We remain available to her.  The patient gave consent to have this visit done by telemedicine / virtual visit.  This is also consent for access the chart and treat the patient via this visit. The patient is located at home.  I, the provider, am at the office.  We spent 10 minutes together for the visit.

## 2020-12-10 MED FILL — METHADONE HCL 5 MG TABS: 5 | 30 days supply | Qty: 270 | Fill #0

## 2020-12-15 ENCOUNTER — Other Ambulatory Visit: Payer: Self-pay | Admitting: *Deleted

## 2020-12-15 DIAGNOSIS — C50412 Malignant neoplasm of upper-outer quadrant of left female breast: Secondary | ICD-10-CM

## 2020-12-16 ENCOUNTER — Other Ambulatory Visit: Payer: Medicaid Other

## 2020-12-17 ENCOUNTER — Ambulatory Visit: Payer: Medicaid Other | Admitting: Physical Therapy

## 2020-12-17 ENCOUNTER — Ambulatory Visit: Payer: Medicaid Other | Admitting: Adult Health

## 2020-12-17 ENCOUNTER — Other Ambulatory Visit: Payer: Medicaid Other

## 2020-12-17 ENCOUNTER — Telehealth: Payer: Self-pay | Admitting: Adult Health
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
## 2020-12-17 NOTE — Telephone Encounter (Signed)
Rescheduled appointment per 12/29 schedule message. Patient is aware of changes. 

## 2020-12-18 ENCOUNTER — Ambulatory Visit: Payer: Medicaid Other | Admitting: Physical Therapy

## 2020-12-22 ENCOUNTER — Telehealth: Payer: Self-pay | Admitting: *Deleted

## 2020-12-22 ENCOUNTER — Ambulatory Visit: Payer: Medicaid Other | Admitting: Adult Health

## 2020-12-22 ENCOUNTER — Other Ambulatory Visit: Payer: Medicaid Other

## 2020-12-22 ENCOUNTER — Telehealth (INDEPENDENT_AMBULATORY_CARE_PROVIDER_SITE_OTHER): Payer: Medicaid Other | Admitting: Podiatry

## 2020-12-22 ENCOUNTER — Encounter: Payer: Medicaid Other | Admitting: Podiatry

## 2020-12-22 NOTE — Telephone Encounter (Signed)
Pt left message on nurse line of need to reschedule appointment today due to not feeling well- she requested a call tomorrow with new appointment " because I am going to take some cold medicine and go to bed "  This RN sent an Urgent message to scheduling per above.

## 2020-12-22 NOTE — Telephone Encounter (Signed)
Spoke with patient. - Dr.Dmari Schubring

## 2020-12-22 NOTE — Telephone Encounter (Signed)
Patient LVM stating she fell yesterday. States she sprained ankle. Please call [phone].

## 2020-12-23 ENCOUNTER — Telehealth: Payer: Self-pay | Admitting: Adult Health

## 2020-12-23 ENCOUNTER — Encounter: Payer: Medicaid Other | Admitting: Physical Therapy

## 2020-12-23 NOTE — Telephone Encounter (Signed)
Rescheduled appointment per 1/3 schedule message. Patient is aware of changes. 

## 2020-12-25 ENCOUNTER — Encounter: Payer: Medicaid Other | Admitting: Physical Therapy

## 2020-12-27 NOTE — Progress Notes (Addendum)
     SUBJECTIVE:   CHIEF COMPLAINT / HPI:   [name] is a 60 y.o. female presents for diabetes screening  Concern for diabetes Would like test for diabetes. Last A1c in 2017 was 5.8.   Ear concern  Has had intermittent ear aches in left ear over the last month. Occurs whenever she washes her hair and has water inside the ear. Associated with tinnitus in the same ear. Denies vertigo, hearing loss,discharge/fluid leakage. Denies recent URI.  PERTINENT  PMH / PSH: CAD, breast cancer, HLD, thrombocytosis   OBJECTIVE:   BP 128/84   Pulse 68   Wt 241 lb (109.3 kg) Comment: pt wearing a boot  LMP 08/07/2011   SpO2 99%   BMI 40.10 kg/m    General: Alert, no acute distress HEENT: NCAT, normal TM bilaterally, left sided fluctuant neck mass present  Cardio: Normal S1 and S2, RRR, no r/m/g Pulm: CTAB, normal work of breathing Abdomen: Bowel sounds normal. Abdomen soft and non-tender.  Extremities: No peripheral edema.  Neuro: Cranial nerves grossly intact   ASSESSMENT/PLAN:   Prediabetes A1c 6 today. Offered patient option of starting metformin now. She declined and would like to implement lifestyle changes first. Recheck in 3-6 months.   Concern about ear disease without diagnosis Pt asymptomatic. Ear exam normal today with normal hearing. Reassured pt that she is likely having symptoms due to water collecting in her ears when she washes it. Recommended trying to keep her ear dry. Follow up if persistent symptoms.   Neck mass CT soft tissue of neck later this week. Pt will follow up after these results.  Hyperlipidemia Obtained lipid panel today.      [name], MD PGY-2 [place]

## 2020-12-29 ENCOUNTER — Ambulatory Visit (INDEPENDENT_AMBULATORY_CARE_PROVIDER_SITE_OTHER): Payer: Medicaid Other | Admitting: Podiatry

## 2020-12-29 ENCOUNTER — Other Ambulatory Visit: Payer: Self-pay

## 2020-12-29 ENCOUNTER — Ambulatory Visit (INDEPENDENT_AMBULATORY_CARE_PROVIDER_SITE_OTHER): Payer: Medicaid Other

## 2020-12-29 DIAGNOSIS — M205X2 Other deformities of toe(s) (acquired), left foot: Secondary | ICD-10-CM | POA: Diagnosis not present

## 2020-12-29 DIAGNOSIS — S8262XA Displaced fracture of lateral malleolus of left fibula, initial encounter for closed fracture: Secondary | ICD-10-CM | POA: Diagnosis not present

## 2020-12-29 DIAGNOSIS — Z9889 Other specified postprocedural states: Secondary | ICD-10-CM
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
## 2020-12-30 ENCOUNTER — Encounter: Payer: Medicaid Other | Admitting: Physical Therapy

## 2020-12-30 ENCOUNTER — Other Ambulatory Visit: Payer: Self-pay | Admitting: Obstetrics and Gynecology

## 2020-12-30 NOTE — Patient Instructions (Signed)
Visit Information  [name] was given information about Medicaid Managed Care team care coordination services as a part of their [place] Medicaid benefit. [name] verbally consented to engagement with the [facility] Managed Care team.   For questions related to your [facility], please call: [phone] or visit the homepage here: [email or web address]  If you would like to schedule transportation through your [facility], please call the following number at least 2 days in advance of your appointment: [phone]  Patient Care Plan: General Plan of Care (Adult)    Problem Identified: Health Promotion or Disease Self-Management (General Plan of Care)   Priority: High  Onset Date: 12/30/2020    Problem Identified: Harm or Injury     Long-Range Goal: Harm or Injury Prevented   Start Date: 12/30/2020  Expected End Date: 03/30/2021  This Visit's Progress: On track  Priority: High  Note:   Current Barriers:  . Patient with recent fall and fracture requiring surgery.  Nurse Case Manager Clinical Goal(s):  Marland Kitchen Over the next 30 days, patient will verbalize understanding of plan for surgery . Over the next 30 days, patient will work with provider to address needs related to fracture. . Over the next 30 days, patient will attend all scheduled medical appointments:  Interventions:  . Inter-disciplinary care team collaboration (see longitudinal plan of care) . Evaluation of current treatment plan related to fracture and patient's adherence to plan as established by provider. . Reviewed medications with patient. . Discussed plans with patient for ongoing care management follow up and provided patient with direct contact information for care management team . Pharmacy referral for medication review.  Patient Goals/Self-Care Activities Over the next 30 days, patient  will:  -Attends all scheduled provider appointments Calls provider office for new concerns or questions  Follow Up Plan: The Managed Medicaid care management team will reach out to the patient again over the next 30 days.  The patient has been provided with contact information for the Managed Medicaid care management team and has been advised to call with any health related questions or concerns.            Patient verbalizes understanding of instructions provided today.   The Managed Medicaid care management team will reach out to the patient again over the next 30 days.  The patient has been provided with contact information for the Managed Medicaid care management team and has been advised to call with any health related questions or concerns.  Aida Raider RN, BSN [place]  [facility] - Managed Medicaid High Risk [phone].

## 2020-12-30 NOTE — Patient Outreach (Signed)
Medicaid Managed Care   Nurse Care Manager Note  12/30/2020 Name:  MACIEL LANGLOIS MRN:  [id number] DOB:  04-03-1961  [name] is an 60 y.o. year old female who is a primary patient of [name], MD.  The [facility] Managed Care Coordination team was consulted for assistance with:    chronic healthcare management needs.  Ms. Steff was given information about Medicaid Managed Care Coordination team services today. [name] agreed to services and verbal consent obtained.  Engaged with patient by telephone for initial visit in response to provider referral for case management and/or care coordination services.   Assessments/Interventions:  Review of past medical history, allergies, medications, health status, including review of consultants reports, laboratory and other test data, was performed as part of comprehensive evaluation and provision of chronic care management services.  SDOH (Social Determinants of Health) assessments and interventions performed:   Care Plan  No Known Allergies  Medications Reviewed Today    Reviewed by Gayla Medicus, RN (Registered Nurse) on 12/30/20 at 1049  Med List Status: <None>  Medication Order Taking? Sig Documenting Provider Last Dose Status Informant  acetaminophen (TYLENOL) 500 MG tablet [id number] Yes Take 2 tablets (1,000 mg total) by mouth every 8 (eight) hours as needed. Carlyle Dolly, MD Taking Active Self  allopurinol (ZYLOPRIM) 300 MG tablet [id number] Yes TAKE 1 TABLET BY MOUTH EVERY DAY [name], MD Taking Active   amLODipine (NORVASC) 5 MG tablet [id number] Yes Take 1 tablet (5 mg total) by mouth daily. [name], MD Taking Active Self  ascorbic acid (VITAMIN C) 500 MG tablet [id number] Yes Take 500 mg by mouth daily at 10 pm. [provider] Taking Active Self  aspirin EC 81 MG tablet [id number] Yes Take 81 mg by mouth daily at 10 pm.  [provider] Taking Active Self  atorvastatin  (LIPITOR) 40 MG tablet [id number] Yes Take 1 tablet (40 mg total) by mouth daily at 6 PM. [name], MD Taking Active Self  camphor-menthol [facility]) lotion [id number] Yes Apply topically as needed for itching. Eugenie Filler, MD Taking Active Self  ELDERBERRY PO [id number] Yes Take 1,000 mg by mouth daily at 10 pm. [provider] Taking Active Self        Discontinued 03/10/12 1404 (Error)   Ginkgo Biloba 120 MG TABS [id number] Yes Take 120 mg by mouth daily at 10 pm. [provider] Taking Active Self  isosorbide mononitrate (IMDUR) 60 MG 24 hr tablet [id number] Yes TAKE 1 TABLET (60 MG TOTAL) BY MOUTH AT BEDTIME. [name], MD Taking Active Self  KLOR-CON M20 20 MEQ tablet [id number] Yes TAKE 1 TABLET (20 MEQ TOTAL) BY MOUTH 3 (THREE) TIMES DAILY. [name], MD Taking Active   methadone (DOLOPHINE) 5 MG tablet [id number]  TAKE 3 TABLETS BY MOUTH EVERY 8 HOURS.  Patient taking differently: TAKE 3 TABLETS BY MOUTH EVERY 8 HOURS as needed,   Magrinat, Virgie Dad, MD  Active   methocarbamol (ROBAXIN) 500 MG tablet [id number] No Take 1 tablet (500 mg total) by mouth every 6 (six) hours as needed for muscle spasms.  Patient not taking: Reported on 12/30/2020   Persons, Bevely Palmer, [place] Not Taking Active   metoprolol succinate (TOPROL-XL) 50 MG 24 hr tablet [id number] Yes Take 2 tablets (100 mg total) by mouth daily. Take with or immediately following a meal. Winfrey, Alcario Drought, MD Taking Active Self  Misc. Devices (RAISED TOILET SEAT) MISC [id number] No 1  each by Does not apply route as needed.  Patient not taking: Reported on 12/30/2020   Myles Gip, DO Not Taking Active Self  nitroGLYCERIN (NITROSTAT) 0.4 MG SL tablet [id number] Yes Place 1 tablet (0.4 mg total) under the tongue every 5 (five) minutes as needed for chest pain. Richardson Dopp T, PA-C Taking Active Self  OVER THE COUNTER MEDICATION [id number] Yes Zinc 30 mg daily as needed [provider] Taking  Active   OVER THE COUNTER MEDICATION [id number]  Ear drying drops - as needed [provider]  Active   oxyCODONE-acetaminophen (PERCOCET) 5-325 MG tablet [id number] No Take 1 tablet by mouth every 6 (six) hours as needed for severe pain.  Patient not taking: Reported on 12/30/2020   Edrick Kins, DPM Not Taking Active   tamoxifen (NOLVADEX) 20 MG tablet [id number] Yes TAKE 1 TABLET BY MOUTH EVERY DAY Magrinat, Virgie Dad, MD Taking Active           Patient Active Problem List   Diagnosis Date Noted  . Neck mass 11/05/2020  . Arthritis of right knee 06/11/2020  . Acquired absence of both breasts 04/01/2020  . Left facial swelling 02/22/2020  . Left arm cellulitis 09/28/2019  . Cellulitis 09/28/2019  . Cellulitis of left arm   . Bilateral leg edema 06/21/2019  . Dysfunctional uterine bleeding 04/30/2019  . Right leg pain 09/13/2018  . Acquired trigger finger of right ring finger 05/29/2018  . Hyperlipidemia 02/08/2018  . History of ST elevation myocardial infarction (STEMI) 12/01/2017  . Endometrial hyperplasia, simple 11/08/2017  . Unilateral primary osteoarthritis, right knee 10/27/2017  . Abdominal mass 08/15/2017  . Hypertensive disorder 04/17/2016  . Dyslipidemia 04/17/2016  . CAD (coronary artery disease) 04/17/2016  . Thrombocytosis 03/10/2015  . Tobacco abuse 12/02/2014  . Cancer of overlapping sites of right female breast ([place]) 09/16/2014  . Breast cancer of upper-outer quadrant of left female breast ([place]) 09/16/2014  . Hot flashes due to tamoxifen 05/09/2014  . Lymphedema of arm 11/08/2013  . Chronic pain 03/31/2012  . Hypokalemia 03/31/2012  . Atherosclerosis of aorta ([facility])     Conditions to be addressed/monitored per PCP order:  chronic healthcare management needs.  Patient Care Plan: General Plan of Care (Adult)    Problem Identified: Health Promotion or Disease Self-Management (General Plan of Care)   Priority: High  Onset Date: 12/30/2020     Problem Identified: Harm or Injury     Long-Range Goal: Harm or Injury Prevented   Start Date: 12/30/2020  Expected End Date: 03/30/2021  This Visit's Progress: On track  Priority: High  Note:   Current Barriers:  . Patient with recent fall and fracture requiring surgery.  Nurse Case Manager Clinical Goal(s):  Marland Kitchen Over the next 30 days, patient will verbalize understanding of plan for surgery . Over the next 30 days, patient will work with provider to address needs related to fracture. . Over the next 30 days, patient will attend all scheduled medical appointments:  Interventions:  . Inter-disciplinary care team collaboration (see longitudinal plan of care) . Evaluation of current treatment plan related to fracture and patient's adherence to plan as established by provider. . Reviewed medications with patient. . Discussed plans with patient for ongoing care management follow up and provided patient with direct contact information for care management team . Pharmacy referral for medication review.  Patient Goals/Self-Care Activities Over the next 30 days, patient will:  -Attends all scheduled provider appointments Calls provider office for new concerns or  questions  Follow Up Plan: The Managed Medicaid care management team will reach out to the patient again over the next 30 days.  The patient has been provided with contact information for the Managed Medicaid care management team and has been advised to call with any health related questions or concerns.            Follow Up:  Patient agrees to Care Plan and Follow-up.  Plan: The Managed Medicaid care management team will reach out to the patient again over the next 30 days. and The patient has been provided with contact information for the Managed Medicaid care management team and has been advised to call with any health related questions or concerns.  Date/time of next scheduled RN care management/care coordination outreach:  01/30/21 at 0900.

## 2020-12-31 ENCOUNTER — Ambulatory Visit (INDEPENDENT_AMBULATORY_CARE_PROVIDER_SITE_OTHER): Payer: Medicaid Other | Admitting: Family Medicine

## 2020-12-31 ENCOUNTER — Other Ambulatory Visit: Payer: Self-pay | Admitting: *Deleted

## 2020-12-31 ENCOUNTER — Encounter: Payer: Self-pay | Admitting: Family Medicine

## 2020-12-31 ENCOUNTER — Other Ambulatory Visit: Payer: Self-pay

## 2020-12-31 VITALS — BP 128/84 | HR 68 | Wt 241.0 lb

## 2020-12-31 DIAGNOSIS — Z131 Encounter for screening for diabetes mellitus: Secondary | ICD-10-CM

## 2020-12-31 DIAGNOSIS — R221 Localized swelling, mass and lump, neck: Secondary | ICD-10-CM

## 2020-12-31 DIAGNOSIS — Z13220 Encounter for screening for lipoid disorders: Secondary | ICD-10-CM

## 2020-12-31 DIAGNOSIS — E1169 Type 2 diabetes mellitus with other specified complication: Secondary | ICD-10-CM

## 2020-12-31 DIAGNOSIS — Z711 Person with feared health complaint in whom no diagnosis is made: Secondary | ICD-10-CM

## 2020-12-31 DIAGNOSIS — E785 Hyperlipidemia, unspecified: Secondary | ICD-10-CM

## 2020-12-31 DIAGNOSIS — R7303 Prediabetes: Secondary | ICD-10-CM

## 2020-12-31 LAB — POCT GLYCOSYLATED HEMOGLOBIN (HGB A1C): Hemoglobin A1C: 6 % — AB (ref 4.0–5.6)

## 2020-12-31 MED ORDER — METOPROLOL SUCCINATE ER 50 MG PO TB24
100.0000 mg | ORAL_TABLET | Freq: Every day | ORAL | 2 refills | Status: DC
Start: 2020-12-31 — End: 2021-10-19

## 2020-12-31 NOTE — Patient Instructions (Signed)
Thank you for coming to see me today. It was a pleasure. You are on the border of diabetes. We can continue to monitor it.   Please follow-up with me after your CT scan result.   We will get some labs today.  If they are abnormal or we need to do something about them, I will call you.  If they are normal, I will send you a message on MyChart (if it is active) or a letter in the mail.  If you don't hear from [name] in 2 weeks, please call the office at the number below.  If you have any questions or concerns, please do not hesitate to call the office at [phone].  If you develop fevers>100.5, shortness of breath, chest pain, palpitations, dizziness, abdominal pain, nausea, vomiting, diarrhea or cannot eat or drink then please go to the ER immediately.  Best wishes,   Dr Posey Pronto

## 2021-01-01 ENCOUNTER — Encounter: Payer: Self-pay | Admitting: Adult Health

## 2021-01-01 ENCOUNTER — Inpatient Hospital Stay: Payer: Medicaid Other | Attending: Oncology

## 2021-01-01 ENCOUNTER — Other Ambulatory Visit: Payer: Self-pay

## 2021-01-01 ENCOUNTER — Encounter: Payer: Self-pay | Admitting: Family Medicine

## 2021-01-01 ENCOUNTER — Inpatient Hospital Stay (HOSPITAL_BASED_OUTPATIENT_CLINIC_OR_DEPARTMENT_OTHER): Payer: Medicaid Other | Admitting: Adult Health

## 2021-01-01 ENCOUNTER — Ambulatory Visit: Payer: Medicaid Other | Admitting: Physical Therapy

## 2021-01-01 DIAGNOSIS — Z923 Personal history of irradiation: Secondary | ICD-10-CM | POA: Insufficient documentation

## 2021-01-01 DIAGNOSIS — C50412 Malignant neoplasm of upper-outer quadrant of left female breast: Secondary | ICD-10-CM

## 2021-01-01 DIAGNOSIS — G894 Chronic pain syndrome: Secondary | ICD-10-CM | POA: Diagnosis not present

## 2021-01-01 DIAGNOSIS — F17210 Nicotine dependence, cigarettes, uncomplicated: Secondary | ICD-10-CM | POA: Insufficient documentation

## 2021-01-01 DIAGNOSIS — Z7982 Long term (current) use of aspirin: Secondary | ICD-10-CM | POA: Insufficient documentation

## 2021-01-01 DIAGNOSIS — I10 Essential (primary) hypertension: Secondary | ICD-10-CM | POA: Insufficient documentation

## 2021-01-01 DIAGNOSIS — C50911 Malignant neoplasm of unspecified site of right female breast: Secondary | ICD-10-CM | POA: Diagnosis not present

## 2021-01-01 DIAGNOSIS — I252 Old myocardial infarction: Secondary | ICD-10-CM | POA: Insufficient documentation

## 2021-01-01 DIAGNOSIS — Z79899 Other long term (current) drug therapy: Secondary | ICD-10-CM | POA: Insufficient documentation

## 2021-01-01 DIAGNOSIS — R7303 Prediabetes: Secondary | ICD-10-CM | POA: Insufficient documentation

## 2021-01-01 DIAGNOSIS — Z79810 Long term (current) use of selective estrogen receptor modulators (SERMs): Secondary | ICD-10-CM | POA: Insufficient documentation

## 2021-01-01 DIAGNOSIS — Z711 Person with feared health complaint in whom no diagnosis is made: Secondary | ICD-10-CM | POA: Insufficient documentation

## 2021-01-01 DIAGNOSIS — C50912 Malignant neoplasm of unspecified site of left female breast: Secondary | ICD-10-CM | POA: Diagnosis not present

## 2021-01-01 DIAGNOSIS — D75839 Thrombocytosis, unspecified: Secondary | ICD-10-CM | POA: Diagnosis not present

## 2021-01-01 DIAGNOSIS — Z9013 Acquired absence of bilateral breasts and nipples: Secondary | ICD-10-CM | POA: Diagnosis not present

## 2021-01-01 DIAGNOSIS — Z170 Estrogen receptor positive status [ER+]: Secondary | ICD-10-CM | POA: Diagnosis not present

## 2021-01-01 LAB — LIPID PANEL
Chol/HDL Ratio: 2.8 ratio (ref 0.0–4.4)
Cholesterol, Total: 141 mg/dL (ref 100–199)
HDL: 50 mg/dL (ref >39)
LDL Chol Calc (NIH): 68 mg/dL (ref 0–99)
Triglycerides: 134 mg/dL (ref 0–149)
VLDL Cholesterol Cal: 23 mg/dL (ref 5–40)

## 2021-01-01 MED ORDER — METHADONE HCL 5 MG PO TABS: ORAL_TABLET | ORAL | 0 refills | Status: DC

## 2021-01-01 NOTE — Assessment & Plan Note (Addendum)
A1c 6 today. Offered patient option of starting metformin now. She declined and would like to implement lifestyle changes first. Recheck in 3-6 months.

## 2021-01-01 NOTE — Assessment & Plan Note (Signed)
Pt asymptomatic. Ear exam normal today with normal hearing. Reassured pt that she is likely having symptoms due to water collecting in her ears when she washes it. Recommended trying to keep her ear dry. Follow up if persistent symptoms.

## 2021-01-01 NOTE — Patient Outreach (Signed)
Medicaid Managed Care    Pharmacy Note  01/01/2021 Name: [name] MRN: [id number] DOB: 1961-01-27  [name] is a 60 y.o. year old female who is a primary care patient of [name], MD. The [facility] Managed Care Coordination team was consulted for assistance with disease management and care coordination needs.    Engaged with patient Engaged with patient by telephone for initial visit in response to referral for case management and/or care coordination services.  [name] was given information about Managed Medicaid Care Coordination team services today. [name] agreed to services and verbal consent obtained.   Objective:  Lab Results  Component Value Date   CREATININE 0.90 10/09/2020   CREATININE 0.83 09/11/2020   CREATININE 0.78 06/10/2020    Lab Results  Component Value Date   HGBA1C 6.0 (A) 12/31/2020       Component Value Date/Time   CHOL 141 12/31/2020 1646   TRIG 134 12/31/2020 1646   HDL 50 12/31/2020 1646   CHOLHDL 2.8 12/31/2020 1646   CHOLHDL 3.9 11/18/2015 0138   VLDL 50 (H) 11/18/2015 0138   LDLCALC 68 12/31/2020 1646    Other: (TSH, CBC, Vit D, etc.)  Clinical ASCVD: Yes  The 10-year ASCVD risk score [name]., et al., 2013) is: 19.4%   Values used to calculate the score:     Age: 60 years     Sex: Female     Is Non-Hispanic African American: Yes     Diabetic: Yes     Tobacco smoker: Yes     Systolic Blood Pressure: 0000000 mmHg     Is BP treated: Yes     HDL Cholesterol: 50 mg/dL     Total Cholesterol: 141 mg/dL    Other: (CHADS2VASc if Afib, PHQ9 if depression, MMRC or CAT for COPD, ACT, DEXA)  BP Readings from Last 3 Encounters:  12/31/20 128/84  11/04/20 125/60  10/09/20 122/89    Assessment/Interventions: Review of patient past medical history, allergies, medications, health status, including review of consultants reports, laboratory and other test data, was performed as part of comprehensive evaluation and  provision of chronic care management services.    Pain 01/01/21: With Meds: 3/10 and without: 7/10 -Methadone (PRN) Plan: At goal,  patient stable/ symptoms controlled    Cardio BP Readings from Last 3 Encounters:  12/31/20 128/84  11/04/20 125/60  10/09/20 122/89    Heart Attack in 2008 -Metoprolol Succ 50mg  -Amlodipine 5mg  -ISMN 60mg  HS Plan: At goal,  patient stable/ symptoms controlled. Will focus on this more at next visit  Pre-DM Lab Results  Component Value Date   HGBA1C 6.0 (A) 12/31/2020   HGBA1C 5.8 09/01/2016   HGBA1C 6.4 (H) 11/18/2015   Lab Results  Component Value Date   MICROALBUR 0.51 07/31/2009   LDLCALC 68 12/31/2020   CREATININE 0.90 10/09/2020    Lab Results  Component Value Date   NA 145 10/09/2020   K 3.4 (L) 10/09/2020   CREATININE 0.90 10/09/2020   GFRNONAA >60 09/11/2020   GFRAA >60 09/11/2020   GLUCOSE 101 (H) 10/09/2020    -Spoke about diet -Country time lemonade -Can't work out but doing rehab soon (Foot is broken so can't do anything now) Plan: At goal,  patient stable/ symptoms controlled   Breast Cx Hx -Tamoxifen Plan: At goal,  patient stable/ symptoms controlled   Gout Lab Results  Component Value Date   LABURIC 3.9 09/29/2020    -Allopurinol 300mg  Plan: At goal,  patient stable/ symptoms controlled    SDOH (Social Determinants of Health) assessments and interventions performed:    Care Plan  No Known Allergies  Medications Reviewed Today    Reviewed by [name], MD (Resident) on 01/01/21 at [phone]  Med List Status: <None>  Medication Order Taking? Sig Documenting Provider Last Dose Status Informant  acetaminophen (TYLENOL) 500 MG tablet [id number] No Take 2 tablets (1,000 mg total) by mouth every 8 (eight) hours as needed. Carlyle Dolly, MD Taking Active Self  allopurinol (ZYLOPRIM) 300 MG tablet [id number] No TAKE 1 TABLET BY MOUTH EVERY DAY [name], MD Taking Active   amLODipine (NORVASC)  5 MG tablet [id number] No Take 1 tablet (5 mg total) by mouth daily. [name], MD Taking Active Self  ascorbic acid (VITAMIN C) 500 MG tablet [id number] No Take 500 mg by mouth daily at 10 pm. [provider] Taking Active Self  aspirin EC 81 MG tablet [id number] No Take 81 mg by mouth daily at 10 pm.  [provider] Taking Active Self  atorvastatin (LIPITOR) 40 MG tablet [id number] No Take 1 tablet (40 mg total) by mouth daily at 6 PM. [name], MD Taking Active Self  camphor-menthol [facility]) lotion [id number] No Apply topically as needed for itching. Eugenie Filler, MD Taking Active Self  ELDERBERRY PO [id number] No Take 1,000 mg by mouth daily at 10 pm. [provider] Taking Active Self        Discontinued 03/10/12 1404 (Error)   Ginkgo Biloba 120 MG TABS [id number] No Take 120 mg by mouth daily at 10 pm. [provider] Taking Active Self  isosorbide mononitrate (IMDUR) 60 MG 24 hr tablet [id number] No TAKE 1 TABLET (60 MG TOTAL) BY MOUTH AT BEDTIME. [name], MD Taking Active Self  KLOR-CON M20 20 MEQ tablet [id number] No TAKE 1 TABLET (20 MEQ TOTAL) BY MOUTH 3 (THREE) TIMES DAILY. [name], MD Taking Active   methadone (DOLOPHINE) 5 MG tablet [id number]  TAKE 3 TABLETS BY MOUTH EVERY 8 HOURS.  Patient taking differently: TAKE 3 TABLETS BY MOUTH EVERY 8 HOURS as needed,   Magrinat, Virgie Dad, MD  Active   methocarbamol (ROBAXIN) 500 MG tablet [id number] No Take 1 tablet (500 mg total) by mouth every 6 (six) hours as needed for muscle spasms.  Patient not taking: Reported on 12/30/2020   Persons, Bevely Palmer, [place] Not Taking Active   metoprolol succinate (TOPROL-XL) 50 MG 24 hr tablet [id number]  Take 2 tablets (100 mg total) by mouth daily. Take with or immediately following a meal. [name], MD  Active   Misc. Devices (RAISED TOILET SEAT) MISC [id number] No 1 each by Does not apply route as needed.  Patient not taking: Reported on  12/30/2020   Myles Gip, DO Not Taking Active Self  nitroGLYCERIN (NITROSTAT) 0.4 MG SL tablet [id number] No Place 1 tablet (0.4 mg total) under the tongue every 5 (five) minutes as needed for chest pain. Richardson Dopp T, PA-C Taking Active Self  OVER THE COUNTER MEDICATION [id number] No Zinc 30 mg daily as needed [provider] Taking Active   OVER THE COUNTER MEDICATION [id number] No Ear drying drops - as needed [provider] Taking Active   oxyCODONE-acetaminophen (PERCOCET) 5-325 MG tablet [id number] No Take 1 tablet by mouth every 6 (six) hours as needed for severe pain.  Patient not taking: Reported on 12/30/2020   Edrick Kins, DPM Not Taking Active   tamoxifen (  NOLVADEX) 20 MG tablet [id number] No TAKE 1 TABLET BY MOUTH EVERY DAY Magrinat, Virgie Dad, MD Taking Active           Patient Active Problem List   Diagnosis Date Noted  . Prediabetes 01/01/2021  . Concern about ear disease without diagnosis 01/01/2021  . Neck mass 11/05/2020  . Arthritis of right knee 06/11/2020  . Acquired absence of both breasts 04/01/2020  . Left facial swelling 02/22/2020  . Left arm cellulitis 09/28/2019  . Cellulitis 09/28/2019  . Cellulitis of left arm   . Bilateral leg edema 06/21/2019  . Dysfunctional uterine bleeding 04/30/2019  . Right leg pain 09/13/2018  . Acquired trigger finger of right ring finger 05/29/2018  . Hyperlipidemia 02/08/2018  . History of ST elevation myocardial infarction (STEMI) 12/01/2017  . Endometrial hyperplasia, simple 11/08/2017  . Unilateral primary osteoarthritis, right knee 10/27/2017  . Abdominal mass 08/15/2017  . Hypertensive disorder 04/17/2016  . Dyslipidemia 04/17/2016  . CAD (coronary artery disease) 04/17/2016  . Thrombocytosis 03/10/2015  . Tobacco abuse 12/02/2014  . Cancer of overlapping sites of right female breast ([place]) 09/16/2014  . Breast cancer of upper-outer quadrant of left female breast ([place]) 09/16/2014  . Hot  flashes due to tamoxifen 05/09/2014  . Lymphedema of arm 11/08/2013  . Chronic pain 03/31/2012  . Hypokalemia 03/31/2012  . Atherosclerosis of aorta ([place])     Conditions to be addressed/monitored: CAD, HTN and DM   Care Plan : Medication Management  Updates made by Lane Hacker, [place] since 01/01/2021 12:00 AM    Problem: Health Promotion or Disease Self-Management (General Plan of Care)     Long-Range Goal: Medication Management   This Visit's Progress: On track  Priority: High  Note:   Current Barriers:  . Patient doesn't know what meds are for and specifics or generalities about disease states . Patient broke foot recently and is unable to ambulate. This is causing a lot of stress  Pharmacist Clinical Goal(s):  Marland Kitchen Over the next 90 days, patient will contact provider office for questions/concerns as evidenced notation of same in electronic health record through collaboration with PharmD and provider.  .   Interventions: . Inter-disciplinary care team collaboration (see longitudinal plan of care) . Comprehensive medication review performed; medication list updated in electronic medical record  @RXCPDIABETES @ @RXCPHYPERTENSION @ @RXCPHYPERLIPIDEMIA @  Patient Goals/Self-Care Activities . Over the next 90 days, patient will:  - take medications as prescribed  Follow Up Plan: The care management team will reach out to the patient again over the next 90 days.     Task: Mutually Develop and Royce Macadamia Achievement of Patient Goals   Note:   Care Management Activities:        Notes:      Medication Assistance: None required. Patient affirms current coverage meets needs.   Follow up: Agree Plan: The care management team will reach out to the patient again over the next 90 days.   [place] Constable, Pharm.D., Managed Medicaid Pharmacist - [phone]
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
## 2021-01-01 NOTE — Assessment & Plan Note (Signed)
CT soft tissue of neck later this week. Pt will follow up after these results.

## 2021-01-01 NOTE — Assessment & Plan Note (Signed)
Obtained lipid panel today. 

## 2021-01-01 NOTE — Patient Instructions (Signed)
Visit Information  [name] was given information about Medicaid Managed Care team care coordination services as a part of their [place] Medicaid benefit. [name] verbally consented to engagement with the [facility] Managed Care team.   For questions related to your [facility], please call: [phone] or visit the homepage here: [email or web address]  If you would like to schedule transportation through your [facility], please call the following number at least 2 days in advance of your appointment: [phone]  [name] - following are the goals we discussed in your visit today:  Goals Addressed   None     Please see education materials related to DM provided as print materials.   Patient verbalizes understanding of instructions provided today.   The Managed Medicaid care management team will reach out to the patient again over the next 90 days.   Lane Hacker, [facility]  Following is a copy of your plan of care:  Patient Care Plan: General Plan of Care (Adult)    Problem Identified: Health Promotion or Disease Self-Management (General Plan of Care)   Priority: High  Onset Date: 12/30/2020    Problem Identified: Harm or Injury     Long-Range Goal: Harm or Injury Prevented   Start Date: 12/30/2020  Expected End Date: 03/30/2021  This Visit's Progress: On track  Priority: High  Note:   Current Barriers:  . Patient with recent fall and fracture requiring surgery.  Nurse Case Manager Clinical Goal(s):  Marland Kitchen Over the next 30 days, patient will verbalize understanding of plan for surgery . Over the next 30 days, patient will work with provider to address needs related to fracture. . Over the next 30 days, patient will attend all scheduled medical appointments:  Interventions:  . Inter-disciplinary care team collaboration (see longitudinal plan of  care) . Evaluation of current treatment plan related to fracture and patient's adherence to plan as established by provider. . Reviewed medications with patient. . Discussed plans with patient for ongoing care management follow up and provided patient with direct contact information for care management team . Pharmacy referral for medication review.  Patient Goals/Self-Care Activities Over the next 30 days, patient will:  -Attends all scheduled provider appointments Calls provider office for new concerns or questions  Follow Up Plan: The Managed Medicaid care management team will reach out to the patient again over the next 30 days.  The patient has been provided with contact information for the Managed Medicaid care management team and has been advised to call with any health related questions or concerns.       Task: Identify and Reduce Risks for Harm or Injury   Note:   Care Management Activities:        Notes:    Patient Care Plan: Medication Management    Problem Identified: Health Promotion or Disease Self-Management (General Plan of Care)     Long-Range Goal: Medication Management   This Visit's Progress: On track  Priority: High  Note:   Current Barriers:  . Patient doesn't know what meds are for and specifics or generalities about disease states . Patient broke foot recently and is unable to ambulate. This is causing a lot of stress  Pharmacist Clinical Goal(s):  Marland Kitchen Over the next 90 days, patient will contact provider office for questions/concerns as evidenced notation of same in electronic health record through collaboration with PharmD and provider.  .   Interventions: . Inter-disciplinary care team  collaboration (see longitudinal plan of care) . Comprehensive medication review performed; medication list updated in electronic medical record  @RXCPDIABETES @ @RXCPHYPERTENSION @ @RXCPHYPERLIPIDEMIA @  Patient Goals/Self-Care Activities . Over the next 90 days,  patient will:  - take medications as prescribed  Follow Up Plan: The care management team will reach out to the patient again over the next 90 days.     Task: Mutually Develop and Royce Macadamia Achievement of Patient Goals   Note:   Care Management Activities:        Notes:      Diabetes Mellitus and Nutrition, Adult When you have diabetes, or diabetes mellitus, it is very important to have healthy eating habits because your blood sugar (glucose) levels are greatly affected by what you eat and drink. Eating healthy foods in the right amounts, at about the same times every day, can help you:  Control your blood glucose.  Lower your risk of heart disease.  Improve your blood pressure.  Reach or maintain a healthy weight. What can affect my meal plan? Every person with diabetes is different, and each person has different needs for a meal plan. Your health care provider may recommend that you work with a dietitian to make a meal plan that is best for you. Your meal plan may vary depending on factors such as:  The calories you need.  The medicines you take.  Your weight.  Your blood glucose, blood pressure, and cholesterol levels.  Your activity level.  Other health conditions you have, such as heart or kidney disease. How do carbohydrates affect me? Carbohydrates, also called carbs, affect your blood glucose level more than any other type of food. Eating carbs naturally raises the amount of glucose in your blood. Carb counting is a method for keeping track of how many carbs you eat. Counting carbs is important to keep your blood glucose at a healthy level, especially if you use insulin or take certain oral diabetes medicines. It is important to know how many carbs you can safely have in each meal. This is different for every person. Your dietitian can help you calculate how many carbs you should have at each meal and for each snack. How does alcohol affect me? Alcohol can cause a  sudden decrease in blood glucose (hypoglycemia), especially if you use insulin or take certain oral diabetes medicines. Hypoglycemia can be a life-threatening condition. Symptoms of hypoglycemia, such as sleepiness, dizziness, and confusion, are similar to symptoms of having too much alcohol.  Do not drink alcohol if: ? Your health care provider tells you not to drink. ? You are pregnant, may be pregnant, or are planning to become pregnant.  If you drink alcohol: ? Do not drink on an empty stomach. ? Limit how much you use to:  0-1 drink a day for women.  0-2 drinks a day for men. ? Be aware of how much alcohol is in your drink. In the [place] one drink equals one 12 oz bottle of beer (355 mL), one 5 oz glass of wine (148 mL), or one 1 oz glass of hard liquor (44 mL). ? Keep yourself hydrated with water, diet soda, or unsweetened iced tea.  Keep in mind that regular soda, juice, and other mixers may contain a lot of sugar and must be counted as carbs. What are tips for following this plan? Reading food labels  Start by checking the serving size on the "Nutrition Facts" label of packaged foods and drinks. The amount of calories, carbs, fats, and  other nutrients listed on the label is based on one serving of the item. Many items contain more than one serving per package.  Check the total grams (g) of carbs in one serving. You can calculate the number of servings of carbs in one serving by dividing the total carbs by 15. For example, if a food has 30 g of total carbs per serving, it would be equal to 2 servings of carbs.  Check the number of grams (g) of saturated fats and trans fats in one serving. Choose foods that have a low amount or none of these fats.  Check the number of milligrams (mg) of salt (sodium) in one serving. Most people should limit total sodium intake to less than 2,300 mg per day.  Always check the nutrition information of foods labeled as "low-fat" or "nonfat." These  foods may be higher in added sugar or refined carbs and should be avoided.  Talk to your dietitian to identify your daily goals for nutrients listed on the label. Shopping  Avoid buying canned, pre-made, or processed foods. These foods tend to be high in fat, sodium, and added sugar.  Shop around the outside edge of the grocery store. This is where you will most often find fresh fruits and vegetables, bulk grains, fresh meats, and fresh dairy. Cooking  Use low-heat cooking methods, such as baking, instead of high-heat cooking methods like deep frying.  Cook using healthy oils, such as olive, canola, or sunflower oil.  Avoid cooking with butter, cream, or high-fat meats. Meal planning  Eat meals and snacks regularly, preferably at the same times every day. Avoid going long periods of time without eating.  Eat foods that are high in fiber, such as fresh fruits, vegetables, beans, and whole grains. Talk with your dietitian about how many servings of carbs you can eat at each meal.  Eat 4-6 oz (112-168 g) of lean protein each day, such as lean meat, chicken, fish, eggs, or tofu. One ounce (oz) of lean protein is equal to: ? 1 oz (28 g) of meat, chicken, or fish. ? 1 egg. ?  cup (62 g) of tofu.  Eat some foods each day that contain healthy fats, such as avocado, nuts, seeds, and fish.   What foods should I eat? Fruits Berries. Apples. Oranges. Peaches. Apricots. Plums. Grapes. Mango. Papaya. Pomegranate. Kiwi. Cherries. Vegetables Lettuce. Spinach. Leafy greens, including kale, chard, collard greens, and mustard greens. Beets. Cauliflower. Cabbage. Broccoli. Carrots. Green beans. Tomatoes. Peppers. Onions. Cucumbers. Brussels sprouts. Grains Whole grains, such as whole-wheat or whole-grain bread, crackers, tortillas, cereal, and pasta. Unsweetened oatmeal. Quinoa. Brown or wild rice. Meats and other proteins Seafood. Poultry without skin. Lean cuts of poultry and beef. Tofu. Nuts.  Seeds. Dairy Low-fat or fat-free dairy products such as milk, yogurt, and cheese. The items listed above may not be a complete list of foods and beverages you can eat. Contact a dietitian for more information. What foods should I avoid? Fruits Fruits canned with syrup. Vegetables Canned vegetables. Frozen vegetables with butter or cream sauce. Grains Refined white flour and flour products such as bread, pasta, snack foods, and cereals. Avoid all processed foods. Meats and other proteins Fatty cuts of meat. Poultry with skin. Breaded or fried meats. Processed meat. Avoid saturated fats. Dairy Full-fat yogurt, cheese, or milk. Beverages Sweetened drinks, such as soda or iced tea. The items listed above may not be a complete list of foods and beverages you should avoid. Contact a dietitian for  more information. Questions to ask a health care provider  Do I need to meet with a diabetes educator?  Do I need to meet with a dietitian?  What number can I call if I have questions?  When are the best times to check my blood glucose? Where to find more information:  American Diabetes Association: diabetes.org  Academy of Nutrition and Dietetics: www.eatright.[employer] of Diabetes and Digestive and Kidney Diseases: [employer].dk  Association of Diabetes Care and Education Specialists: www.diabeteseducator.org Summary  It is important to have healthy eating habits because your blood sugar (glucose) levels are greatly affected by what you eat and drink.  A healthy meal plan will help you control your blood glucose and maintain a healthy lifestyle.  Your health care provider may recommend that you work with a dietitian to make a meal plan that is best for you.  Keep in mind that carbohydrates (carbs) and alcohol have immediate effects on your blood glucose levels. It is important to count carbs and to use alcohol carefully. This information is not intended to replace  advice given to you by your health care provider. Make sure you discuss any questions you have with your health care provider. Document Revised: 11/13/2019 Document Reviewed: 11/13/2019 Elsevier Patient Education  2021 [employer].

## 2021-01-02 ENCOUNTER — Inpatient Hospital Stay: Admission: RE | Admit: 2021-01-02 | Payer: Medicaid Other | Source: Ambulatory Visit

## 2021-01-05 ENCOUNTER — Telehealth: Payer: Self-pay | Admitting: Adult Health

## 2021-01-05 NOTE — Telephone Encounter (Signed)
No 1/13 los. No changes made to pt's schedule.  

## 2021-01-06 ENCOUNTER — Encounter: Payer: Medicaid Other | Admitting: Physical Therapy

## 2021-01-08 ENCOUNTER — Other Ambulatory Visit: Payer: Self-pay | Admitting: Oncology

## 2021-01-08 ENCOUNTER — Telehealth: Payer: Self-pay

## 2021-01-08 ENCOUNTER — Encounter: Payer: Medicaid Other | Admitting: Physical Therapy

## 2021-01-08 ENCOUNTER — Other Ambulatory Visit: Payer: Self-pay | Admitting: Adult Health

## 2021-01-08 DIAGNOSIS — C50412 Malignant neoplasm of upper-outer quadrant of left female breast: Secondary | ICD-10-CM

## 2021-01-08 MED ORDER — METHADONE HCL 5 MG PO TABS: ORAL_TABLET | ORAL | 0 refills | Status: DC

## 2021-01-08 MED FILL — METHADONE HCL 5 MG TABS: 5 | 30 days supply | Qty: 270 | Fill #0

## 2021-01-08 NOTE — Telephone Encounter (Signed)
Attempted to reach patient in regards to scheduling an appointment with Dr. Ihor Dow (fibroids) and unable to leave message.   Patient does not have my chart to send a my chart message. [name] RN

## 2021-01-08 NOTE — Progress Notes (Signed)
Patient requesting to fill Methadone one day early due to impending snow.  PMP aware reviewed, no red flags, script sent to [facility] for refill of #270 Methadone.    Wilber Bihari, NP

## 2021-01-08 NOTE — Telephone Encounter (Signed)
-----   Message from Lavonia Drafts, MD sent at 01/08/2021 12:17 PM EST ----- Please contact pt with a f/u visit to eval uterine fibroids.   Thx,  Clh-S    ----- Message ----- From: Gardenia Phlegm, NP Sent: 01/01/2021   1:19 PM EST To: Lavonia Drafts, MD  Patient has fibroid uterus and feels her pelvis is becoming more full.  Can someone from your office reach out to get her back in for follow up to discuss removal?  Thanks,  Mendel Ryder

## 2021-01-09 ENCOUNTER — Other Ambulatory Visit: Payer: Self-pay | Admitting: Oncology

## 2021-01-13 ENCOUNTER — Ambulatory Visit: Payer: Medicaid Other | Attending: Orthopedic Surgery | Admitting: Physical Therapy

## 2021-01-14 ENCOUNTER — Ambulatory Visit: Payer: Medicaid Other | Admitting: Podiatry

## 2021-01-15 ENCOUNTER — Ambulatory Visit: Payer: Medicaid Other | Admitting: Physical Therapy

## 2021-01-21 ENCOUNTER — Other Ambulatory Visit: Payer: Self-pay

## 2021-01-21 ENCOUNTER — Ambulatory Visit: Payer: Medicaid Other | Attending: Orthopedic Surgery | Admitting: Physical Therapy

## 2021-01-21 DIAGNOSIS — R262 Difficulty in walking, not elsewhere classified: Secondary | ICD-10-CM | POA: Insufficient documentation

## 2021-01-21 DIAGNOSIS — I972 Postmastectomy lymphedema syndrome: Secondary | ICD-10-CM | POA: Diagnosis not present

## 2021-01-21 DIAGNOSIS — M6281 Muscle weakness (generalized): Secondary | ICD-10-CM | POA: Diagnosis present

## 2021-01-21 DIAGNOSIS — R293 Abnormal posture: Secondary | ICD-10-CM | POA: Diagnosis present
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
## 2021-01-26 ENCOUNTER — Telehealth: Payer: Self-pay | Admitting: *Deleted

## 2021-01-26 ENCOUNTER — Telehealth: Payer: Self-pay

## 2021-01-26 NOTE — Telephone Encounter (Signed)
This RN return VM from the patient inquiring about referral from GYN per fibroid " I haven't heard from them yet "  Informed her the office of Dr Ihor Dow attempted to contact her but unable to leave a message.  Number given for her to return call to above office.  [name] also states she has a new knot above the incision from her remote mastectomy on th left side. Noted several weeks ago, presently not enlarging but it is " a little tender and it is worrying me "  Pt is scheduled for MD follow up in late March but she is concerned about waiting that long to see MD.  This note will be given to MD for review and further recommendation per area of concern.

## 2021-01-26 NOTE — Telephone Encounter (Signed)
Patient calls nurse line in regards to CT soft tissue neck. Patient reports that imaging has had to be canceled multiple times and now needs a new order.   Per chart review, order expired on 01/04/2021. Please advise if new order can be placed.   Talbot Grumbling, RN

## 2021-01-27 ENCOUNTER — Other Ambulatory Visit: Payer: Self-pay | Admitting: Family Medicine

## 2021-01-27 DIAGNOSIS — R221 Localized swelling, mass and lump, neck: Secondary | ICD-10-CM

## 2021-01-27 NOTE — Telephone Encounter (Signed)
I have reordered the ct soft tissue. Please could you inform the pt, thank you.

## 2021-01-28 ENCOUNTER — Telehealth: Payer: Self-pay | Admitting: Oncology

## 2021-01-28 NOTE — Telephone Encounter (Signed)
Perfect thank you Sharrie.

## 2021-01-28 NOTE — Telephone Encounter (Signed)
Spoke to pt. CT scheduled for 02/11/2021. Pt has an appt with Dr Posey Pronto on 01/30/2021. Ottis Stain, CMA

## 2021-01-28 NOTE — Telephone Encounter (Signed)
R/s appt per 2/08 sch msg - pt is aware of new appt date and time

## 2021-01-30 ENCOUNTER — Other Ambulatory Visit: Payer: Self-pay | Admitting: Obstetrics and Gynecology

## 2021-01-30 ENCOUNTER — Ambulatory Visit: Payer: Medicaid Other | Admitting: Family Medicine

## 2021-01-30 NOTE — Progress Notes (Deleted)
     SUBJECTIVE:   CHIEF COMPLAINT / HPI:   [name] is a 60 y.o. female presents with neck swelling follow-up   Left-sided neck swelling CT soft tissue of the neck is scheduled for 23 February.  ***  PERTINENT  PMH / PSH: CAD, atherosclerosis of aorta, hot flashes, endometrial hyperplasia  OBJECTIVE:   LMP 08/07/2011    General: Alert, no acute distress Cardio: Normal S1 and S2, RRR, no r/m/g Pulm: CTAB, normal work of breathing Abdomen: Bowel sounds normal. Abdomen soft and non-tender.  Extremities: No peripheral edema.  Neuro: Cranial nerves grossly intact   ASSESSMENT/PLAN:   No problem-specific Assessment & Plan notes found for this encounter.     [name], MD PGY-2 [place]

## 2021-01-30 NOTE — Patient Instructions (Signed)
Visit Information  [name]  - as a part of your Medicaid benefit, you are eligible for care management and care coordination services at no cost or copay. I was unable to reach you by phone today but would be happy to help you with your health related needs. Please feel free to call me at [phone].  A member of the Managed Medicaid care management team will reach out to you again over the next 7 days.   Aida Raider RN, BSN [place]  [facility] - Managed Medicaid High Risk [phone].

## 2021-01-30 NOTE — Patient Outreach (Signed)
Care Coordination  01/30/2021  BROOKLYN ALFREDO 01/02/1961 [id number]    Medicaid Managed Care   Unsuccessful Outreach Note  01/30/2021 Name: MORGAINE KIMBALL MRN: [id number] DOB: [birth year]  Referred by: [name], MD Reason for referral : High Risk Managed Medicaid (Unsuccessful telephone outreach)   An unsuccessful telephone outreach was attempted today. The patient was referred to the case management team for assistance with care management and care coordination.   Follow Up Plan: A member of the Managed Medicaid care management team will reach out to the patient again over the next 7 days.   Aida Raider RN, BSN [address]  [facility] - Managed Medicaid High Risk [phone].

## 2021-02-02 ENCOUNTER — Ambulatory Visit: Payer: Medicaid Other | Admitting: Podiatry
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
## 2021-02-09 ENCOUNTER — Other Ambulatory Visit: Payer: Self-pay

## 2021-02-09 ENCOUNTER — Ambulatory Visit: Payer: Medicaid Other | Admitting: Podiatry

## 2021-02-09 ENCOUNTER — Inpatient Hospital Stay: Payer: Medicaid Other | Attending: Oncology

## 2021-02-09 ENCOUNTER — Inpatient Hospital Stay (HOSPITAL_BASED_OUTPATIENT_CLINIC_OR_DEPARTMENT_OTHER): Payer: Medicaid Other | Admitting: Oncology

## 2021-02-09 VITALS — BP 127/72 | HR 74 | Temp 97.7°F | Resp 18 | Ht 65.0 in | Wt 242.8 lb

## 2021-02-09 DIAGNOSIS — Z79899 Other long term (current) drug therapy: Secondary | ICD-10-CM | POA: Diagnosis not present

## 2021-02-09 DIAGNOSIS — C50412 Malignant neoplasm of upper-outer quadrant of left female breast: Secondary | ICD-10-CM | POA: Diagnosis not present

## 2021-02-09 DIAGNOSIS — C50912 Malignant neoplasm of unspecified site of left female breast: Secondary | ICD-10-CM | POA: Insufficient documentation

## 2021-02-09 DIAGNOSIS — D75839 Thrombocytosis, unspecified: Secondary | ICD-10-CM | POA: Insufficient documentation

## 2021-02-09 DIAGNOSIS — Z9013 Acquired absence of bilateral breasts and nipples: Secondary | ICD-10-CM | POA: Insufficient documentation

## 2021-02-09 DIAGNOSIS — Z923 Personal history of irradiation: Secondary | ICD-10-CM | POA: Insufficient documentation

## 2021-02-09 DIAGNOSIS — G894 Chronic pain syndrome: Secondary | ICD-10-CM | POA: Insufficient documentation

## 2021-02-09 DIAGNOSIS — I10 Essential (primary) hypertension: Secondary | ICD-10-CM | POA: Diagnosis not present

## 2021-02-09 DIAGNOSIS — Z79810 Long term (current) use of selective estrogen receptor modulators (SERMs): Secondary | ICD-10-CM | POA: Diagnosis not present

## 2021-02-09 DIAGNOSIS — Z170 Estrogen receptor positive status [ER+]: Secondary | ICD-10-CM | POA: Insufficient documentation

## 2021-02-09 DIAGNOSIS — F17210 Nicotine dependence, cigarettes, uncomplicated: Secondary | ICD-10-CM | POA: Diagnosis not present

## 2021-02-09 DIAGNOSIS — Z79891 Long term (current) use of opiate analgesic: Secondary | ICD-10-CM | POA: Diagnosis not present

## 2021-02-09 DIAGNOSIS — C50811 Malignant neoplasm of overlapping sites of right female breast: Secondary | ICD-10-CM

## 2021-02-09 DIAGNOSIS — Z7982 Long term (current) use of aspirin: Secondary | ICD-10-CM | POA: Insufficient documentation

## 2021-02-09 DIAGNOSIS — C7989 Secondary malignant neoplasm of other specified sites: Secondary | ICD-10-CM | POA: Diagnosis not present

## 2021-02-09 DIAGNOSIS — C50911 Malignant neoplasm of unspecified site of right female breast: Secondary | ICD-10-CM | POA: Insufficient documentation

## 2021-02-09 LAB — CBC WITH DIFFERENTIAL (CANCER CENTER ONLY)
Abs Immature Granulocytes: 0.07 10*3/uL (ref 0.00–0.07)
Basophils Absolute: 0.1 10*3/uL (ref 0.0–0.1)
Basophils Relative: 1 %
Eosinophils Absolute: 0.2 10*3/uL (ref 0.0–0.5)
Eosinophils Relative: 2 %
HCT: 45.4 % (ref 36.0–46.0)
Hemoglobin: 14.7 g/dL (ref 12.0–15.0)
Immature Granulocytes: 1 %
Lymphocytes Relative: 24 %
Lymphs Abs: 2.5 10*3/uL (ref 0.7–4.0)
MCH: 28 pg (ref 26.0–34.0)
MCHC: 32.4 g/dL (ref 30.0–36.0)
MCV: 86.5 fL (ref 80.0–100.0)
Monocytes Absolute: 0.9 10*3/uL (ref 0.1–1.0)
Monocytes Relative: 8 %
Neutro Abs: 6.7 10*3/uL (ref 1.7–7.7)
Neutrophils Relative %: 64 %
Platelet Count: 538 10*3/uL — ABNORMAL HIGH (ref 150–400)
RBC: 5.25 MIL/uL — ABNORMAL HIGH (ref 3.87–5.11)
RDW: 15.7 % — ABNORMAL HIGH (ref 11.5–15.5)
WBC Count: 10.5 10*3/uL (ref 4.0–10.5)
nRBC: 0 % (ref 0.0–0.2)

## 2021-02-09 LAB — CMP (CANCER CENTER ONLY)
ALT: 17 U/L (ref 0–44)
AST: 17 U/L (ref 15–41)
Albumin: 3.8 g/dL (ref 3.5–5.0)
Alkaline Phosphatase: 112 U/L (ref 38–126)
Anion gap: 9 (ref 5–15)
BUN: 14 mg/dL (ref 6–20)
CO2: 21 mmol/L — ABNORMAL LOW (ref 22–32)
Calcium: 8.9 mg/dL (ref 8.9–10.3)
Chloride: 111 mmol/L (ref 98–111)
Creatinine: 0.82 mg/dL (ref 0.44–1.00)
GFR, Estimated: >60 mL/min (ref >60)
Glucose, Bld: 111 mg/dL — ABNORMAL HIGH (ref 70–99)
Potassium: 3.6 mmol/L (ref 3.5–5.1)
Sodium: 141 mmol/L (ref 135–145)
Total Bilirubin: 0.4 mg/dL (ref 0.3–1.2)
Total Protein: 7.2 g/dL (ref 6.5–8.1)

## 2021-02-09 MED ORDER — TAMOXIFEN CITRATE 20 MG PO TABS
20.0000 mg | ORAL_TABLET | Freq: Every day | ORAL | 4 refills | Status: DC
Start: 2021-02-09 — End: 2022-04-02

## 2021-02-09 MED ORDER — METHADONE HCL 5 MG PO TABS: ORAL_TABLET | ORAL | 0 refills | Status: DC

## 2021-02-09 MED FILL — METHADONE HCL 5 MG TABS: 5 | 30 days supply | Qty: 270 | Fill #0

## 2021-02-11 ENCOUNTER — Other Ambulatory Visit: Payer: Medicaid Other

## 2021-02-13 ENCOUNTER — Ambulatory Visit: Payer: Medicaid Other | Admitting: Family Medicine

## 2021-02-13 NOTE — Progress Notes (Deleted)
     SUBJECTIVE:   CHIEF COMPLAINT / HPI:   [name] is a 60 y.o. female presents with ***  Anterior neck mass [name] is now 9-1/2 years out from definitive surgery for her breast cancer.   I have set her up for chest CT to be added to the neck CT already ordered by her primary care physician Dr. Posey [name].  I have also asked surgery to evaluate  Flowsheet Row Office Visit from 12/31/2020 in [place]  PHQ-9 Total Score 0      Left chest wall masss   She has a mass on her left chest wall which is consistent with recurrent breast disease. Followed by Oncologist. Pending CT chest and surgical referral.  PERTINENT  PMH / PSH:   OBJECTIVE:   LMP 08/07/2011    General: Alert, no acute distress Cardio: Normal S1 and S2, RRR, no r/m/g Pulm: CTAB, normal work of breathing Abdomen: Bowel sounds normal. Abdomen soft and non-tender.  Extremities: No peripheral edema.  Neuro: Cranial nerves grossly intact   ASSESSMENT/PLAN:   No problem-specific Assessment & Plan notes found for this encounter.     Savannah Haw, MD PGY-2 [place]

## 2021-02-23 DIAGNOSIS — S8262XA Displaced fracture of lateral malleolus of left fibula, initial encounter for closed fracture: Secondary | ICD-10-CM | POA: Diagnosis not present

## 2021-02-24 ENCOUNTER — Other Ambulatory Visit: Payer: Self-pay | Admitting: General Surgery

## 2021-02-24 DIAGNOSIS — I890 Lymphedema, not elsewhere classified: Secondary | ICD-10-CM | POA: Diagnosis not present

## 2021-02-24 DIAGNOSIS — R222 Localized swelling, mass and lump, trunk: Secondary | ICD-10-CM | POA: Diagnosis not present

## 2021-02-24 DIAGNOSIS — S8262XA Displaced fracture of lateral malleolus of left fibula, initial encounter for closed fracture: Secondary | ICD-10-CM | POA: Diagnosis not present

## 2021-02-25 DIAGNOSIS — S8262XA Displaced fracture of lateral malleolus of left fibula, initial encounter for closed fracture: Secondary | ICD-10-CM | POA: Diagnosis not present

## 2021-02-26 ENCOUNTER — Telehealth: Payer: Self-pay | Admitting: Physical Therapy

## 2021-02-26 DIAGNOSIS — S8262XA Displaced fracture of lateral malleolus of left fibula, initial encounter for closed fracture: Secondary | ICD-10-CM | POA: Diagnosis not present

## 2021-02-26 NOTE — Telephone Encounter (Signed)
Pt called and asked for return phone call.  There was no answer so left a message and asked pt to call for an appointment when she is ready to return to [place]. Owens Shark, PT

## 2021-02-27 ENCOUNTER — Other Ambulatory Visit: Payer: Medicaid Other

## 2021-02-27 DIAGNOSIS — S8262XA Displaced fracture of lateral malleolus of left fibula, initial encounter for closed fracture: Secondary | ICD-10-CM | POA: Diagnosis not present

## 2021-02-28 DIAGNOSIS — S8262XA Displaced fracture of lateral malleolus of left fibula, initial encounter for closed fracture: Secondary | ICD-10-CM | POA: Diagnosis not present

## 2021-03-01 DIAGNOSIS — S8262XA Displaced fracture of lateral malleolus of left fibula, initial encounter for closed fracture: Secondary | ICD-10-CM | POA: Diagnosis not present

## 2021-03-02 DIAGNOSIS — S8262XA Displaced fracture of lateral malleolus of left fibula, initial encounter for closed fracture: Secondary | ICD-10-CM | POA: Diagnosis not present

## 2021-03-03 ENCOUNTER — Ambulatory Visit: Payer: Medicaid Other | Admitting: Family Medicine

## 2021-03-03 DIAGNOSIS — S8262XA Displaced fracture of lateral malleolus of left fibula, initial encounter for closed fracture: Secondary | ICD-10-CM | POA: Diagnosis not present

## 2021-03-03 NOTE — Progress Notes (Deleted)
     SUBJECTIVE:   CHIEF COMPLAINT / HPI:   [name] is a 60 y.o. female presents for follow up of neck mass  Neck mass Seen in clinic for neck mass. Ordered CT soft tissue neck which will be on 03/17/21.  [place] Office Visit from 12/31/2020 in [place]  PHQ-9 Total Score 0       Health Maintenance Due  Topic  . COVID-19 Vaccine (1)  . COLONOSCOPY (Pts 45-17yrs Insurance coverage will need to be confirmed)   . URINE MICROALBUMIN   . INFLUENZA VACCINE   . PAP SMEAR-Modifier       PERTINENT  PMH / PSH:   OBJECTIVE:   LMP 08/07/2011    General: Alert, no acute distress Cardio: Normal S1 and S2, RRR, no r/m/g Pulm: CTAB, normal work of breathing Abdomen: Bowel sounds normal. Abdomen soft and non-tender.  Extremities: No peripheral edema.  Neuro: Cranial nerves grossly intact   ASSESSMENT/PLAN:   No problem-specific Assessment & Plan notes found for this encounter.     [name], MD PGY-2 [place]

## 2021-03-04 DIAGNOSIS — S8262XA Displaced fracture of lateral malleolus of left fibula, initial encounter for closed fracture: Secondary | ICD-10-CM | POA: Diagnosis not present

## 2021-03-05 ENCOUNTER — Encounter: Payer: Medicaid Other | Admitting: Obstetrics & Gynecology

## 2021-03-05 ENCOUNTER — Other Ambulatory Visit: Payer: Self-pay | Admitting: Oncology

## 2021-03-05 DIAGNOSIS — C50412 Malignant neoplasm of upper-outer quadrant of left female breast: Secondary | ICD-10-CM

## 2021-03-05 DIAGNOSIS — S8262XA Displaced fracture of lateral malleolus of left fibula, initial encounter for closed fracture: Secondary | ICD-10-CM | POA: Diagnosis not present

## 2021-03-05 NOTE — Telephone Encounter (Signed)
Dr. [name], For your approval or refusal. Gardiner Rhyme, RN

## 2021-03-06 ENCOUNTER — Telehealth: Payer: Self-pay | Admitting: Oncology

## 2021-03-06 ENCOUNTER — Other Ambulatory Visit: Payer: Self-pay | Admitting: Oncology

## 2021-03-06 ENCOUNTER — Other Ambulatory Visit: Payer: Medicaid Other

## 2021-03-06 DIAGNOSIS — C50412 Malignant neoplasm of upper-outer quadrant of left female breast: Secondary | ICD-10-CM

## 2021-03-06 DIAGNOSIS — S8262XA Displaced fracture of lateral malleolus of left fibula, initial encounter for closed fracture: Secondary | ICD-10-CM | POA: Diagnosis not present

## 2021-03-06 MED ORDER — METHADONE HCL 5 MG PO TABS: ORAL_TABLET | ORAL | 0 refills | Status: DC

## 2021-03-06 NOTE — Telephone Encounter (Signed)
Scheduled per 03/18 scheduled message, patient has been called and notified of 03/22 appointments.

## 2021-03-07 ENCOUNTER — Other Ambulatory Visit: Payer: Self-pay | Admitting: Oncology

## 2021-03-07 DIAGNOSIS — S8262XA Displaced fracture of lateral malleolus of left fibula, initial encounter for closed fracture: Secondary | ICD-10-CM | POA: Diagnosis not present

## 2021-03-07 DIAGNOSIS — C50412 Malignant neoplasm of upper-outer quadrant of left female breast: Secondary | ICD-10-CM

## 2021-03-08 DIAGNOSIS — S8262XA Displaced fracture of lateral malleolus of left fibula, initial encounter for closed fracture: Secondary | ICD-10-CM | POA: Diagnosis not present

## 2021-03-09 ENCOUNTER — Telehealth: Payer: Self-pay

## 2021-03-09 ENCOUNTER — Other Ambulatory Visit: Payer: Self-pay

## 2021-03-09 ENCOUNTER — Other Ambulatory Visit: Payer: Self-pay | Admitting: Oncology

## 2021-03-09 ENCOUNTER — Ambulatory Visit: Payer: Medicaid Other | Admitting: Obstetrics & Gynecology

## 2021-03-09 ENCOUNTER — Encounter (HOSPITAL_BASED_OUTPATIENT_CLINIC_OR_DEPARTMENT_OTHER): Payer: Self-pay | Admitting: General Surgery

## 2021-03-09 DIAGNOSIS — C50412 Malignant neoplasm of upper-outer quadrant of left female breast: Secondary | ICD-10-CM

## 2021-03-09 DIAGNOSIS — S8262XA Displaced fracture of lateral malleolus of left fibula, initial encounter for closed fracture: Secondary | ICD-10-CM | POA: Diagnosis not present

## 2021-03-09 MED ORDER — METHADONE HCL 5 MG PO TABS: ORAL_TABLET | ORAL | 0 refills | Status: DC

## 2021-03-09 MED FILL — METHADONE HCL 5 MG TABS: 5 | 30 days supply | Qty: 270 | Fill #0

## 2021-03-09 NOTE — Telephone Encounter (Signed)
Contacted pt to let her know her methadone prescription was canceled at [facility] per her request and has been successfully sent to the [facility]. Pt acknowledged .

## 2021-03-09 NOTE — Progress Notes (Signed)
Patient sees cardiology for MI, HTN, and CAD. Pt denies any changes, SOB, or chest pain since her last cardiology visit 05/2020. Chart and note reviewed with Dr. Sabra Heck, ok to proceed with surgery as scheduled without further cardiac clearance or testing. Aspirin orders requested from [facility] [place], and asked her to let Dr. Barry Dienes know that patient currently has a broken ankle.
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
## 2021-03-10 ENCOUNTER — Inpatient Hospital Stay: Payer: Medicaid Other | Admitting: Oncology

## 2021-03-10 ENCOUNTER — Inpatient Hospital Stay: Payer: Medicaid Other

## 2021-03-10 ENCOUNTER — Telehealth: Payer: Self-pay | Admitting: Oncology

## 2021-03-10 DIAGNOSIS — S8262XA Displaced fracture of lateral malleolus of left fibula, initial encounter for closed fracture: Secondary | ICD-10-CM | POA: Diagnosis not present

## 2021-03-10 NOTE — Telephone Encounter (Signed)
R/s todays appt per patient request. Having foot pain. Called and spoke with patient. Confirmed new date and time

## 2021-03-11 DIAGNOSIS — S8262XA Displaced fracture of lateral malleolus of left fibula, initial encounter for closed fracture: Secondary | ICD-10-CM | POA: Diagnosis not present

## 2021-03-11 NOTE — H&P (View-Only) (Signed)
[name] Appointment: 02/24/2021 1:45 PM Location: [facility] Patient #: [id number] DOB: 04/24/1961 Single / Language: [name] / Race: Black or African American Female   History of Present Illness Savannah Klein MD; 02/24/2021 2:35 PM) The patient is a 60 year old female who presents for a follow-up for Breast cancer. Pt is a 60 yo F with history of bilateral breast cancers. She had bilateral mastectomies by Dr. Excell Seltzer in August 2012 for grade 3 ER/PR positive invasive cancer of the left breast measuring 5.7 cm with 2 of 18 lymph nodes positive. Right breast had multiple foci of invasive lobular cancer up to 1.7 cm with negative lymph nodes. She had chemotherapy and left sided XRT. She has been followed regularly in the cancer clinic by Dr. [name]. She has chronic lymphedema of her left arm. She has had cellulitis requiring hospitalization within the last year. She remains on tamoxifen.   She recently saw Dr. [name] with concerns about a knot on her left chest wall above her mastectomy scar. This has become painful and seems to be enlarging a little. She is on methadone for post chemotherapy pain syndrome. She is getting new left arm sleeves for her lymphedema.    Medication History Carrolyn Leigh, [place]; 02/24/2021 1:52 PM) Metoprolol Succinate ER (50MG  Tablet ER 24HR, Oral) Active. Hydrocodone-Acetaminophen (10-325MG  Tablet, Oral) Active. Hydrocodone-Acetaminophen (10-300MG  Tablet, Oral) Active. Ibuprofen (600MG  Tablet, Oral) Active. Atorvastatin Calcium (40MG  Tablet, Oral) Active. Benzonatate (100MG  Capsule, Oral) Active. Isosorbide Mononitrate ER (60MG  Tablet ER 24HR, Oral) Active. Metoprolol Succinate ER (100MG  Tablet ER 24HR, Oral) Active. Nitroglycerin (0.4MG  Tab Sublingual, Sublingual) Active. Aspirin (325MG  Tablet, Oral) Active. Triamterene-HCTZ (75-50MG  Tablet, Oral) Active.    Review of Systems Savannah Klein MD; 02/24/2021 2:35 PM) All other  systems negative   Physical Exam Savannah Klein MD; 02/24/2021 2:37 PM) General Mental Status-Alert. General Appearance-Consistent with stated age. Hydration-Well hydrated. Voice-Normal.  Head and Neck Head-normocephalic, atraumatic with no lesions or palpable masses.  Eye Sclera/Conjunctiva - Bilateral-No scleral icterus.  Chest and Lung Exam Chest and lung exam reveals -quiet, even and easy respiratory effort with no use of accessory muscles. Inspection Chest Wall - Normal. Back - normal.  Breast Note: both breasts are surgically absent. She has a firm nodule just below the skin around 1-2 cm above the left mastectomy scar approximately 4 cm from the medial aspect of the scar. The mass is around 2-3 cm and feels like it is not completely mobile. There are no other masses and no LAD on either side. She has significant left arm lymphedema.   Cardiovascular Cardiovascular examination reveals -normal pedal pulses bilaterally. Note: regular rate and rhythm  Abdomen Inspection-Inspection Normal. Palpation/Percussion Palpation and Percussion of the abdomen reveal - Soft, Non Tender, No Rebound tenderness, No Rigidity (guarding) and No hepatosplenomegaly.  Peripheral Vascular Upper Extremity Inspection - Bilateral - Normal - No Clubbing, No Cyanosis, No Edema, Pulses Intact. Lower Extremity Palpation - Edema - Bilateral - No edema - Bilateral.  Neurologic Neurologic evaluation reveals -alert and oriented x 3 with no impairment of recent or remote memory. Mental Status-Normal.  Musculoskeletal Global Assessment -Note: no gross deformities.  Normal Exam - Left-Upper Extremity Strength Normal and Lower Extremity Strength Normal. Normal Exam - Right-Upper Extremity Strength Normal and Lower Extremity Strength Normal.  Lymphatic Head & Neck  General Head & Neck Lymphatics: Bilateral - Description - Normal. Axillary  General Axillary Region:  Bilateral - Description - Normal. Tenderness - Non Tender.    Assessment &  Plan Savannah Klein MD; 02/24/2021 2:39 PM) NODULE OF LEFT ANTERIOR CHEST WALL (R22.2) Impression: I am concerned about cancer recurrence given that this is tender and very firm.  I advised that I would make a new incision as I think the overlying skin also needs to be removed.  I discussed risks of surgery including bleeding and infection as the most common. I discussed that she could do choice of sedation vs general. I may need to take some underlying muscle if it is tethered.  This will be scheduled expeditiously. Current Plans You are being scheduled for surgery- Our schedulers will call you.  You should hear from our office's scheduling department within 5 working days about the location, date, and time of surgery. We try to make accommodations for patient's preferences in scheduling surgery, but sometimes the OR schedule or the surgeon's schedule prevents [name] from making those accommodations.  If you have not heard from our office [phone]) in 5 working days, call the office and ask for your surgeon's nurse.  If you have other questions about your diagnosis, plan, or surgery, call the office and ask for your surgeon's nurse.  BILATERAL MALIGNANT NEOPLASM OF BREAST IN FEMALE, UNSPECIFIED SITE OF BREAST Impression: History of bilateral breast cancer, locally advanced stage III on the left, status post bilateral mastectomy, status post chemotherapy and trastuzumab and tamoxifen.  This nodule may reflect recurrence. If so, she may need additional chemo vs different targeted therapy. LYMPHEDEMA OF LEFT ARM (I89.0) Impression: this is being addressed already by PT.    Signed electronically by Savannah Klein, MD (02/24/2021 2:40 PM)

## 2021-03-11 NOTE — H&P (Signed)
[name] Appointment: 02/24/2021 1:45 PM Location: [facility] Patient #: [id number] DOB: 05/21/1961 Single / Language: Cleophus Molt / Race: Black or African American Female   History of Present Illness Stark Klein MD; 02/24/2021 2:35 PM) The patient is a 60 year old female who presents for a follow-up for Breast cancer. Pt is a 60 yo F with history of bilateral breast cancers. She had bilateral mastectomies by Dr. Excell Seltzer in August 2012 for grade 3 ER/PR positive invasive cancer of the left breast measuring 5.7 cm with 2 of 18 lymph nodes positive. Right breast had multiple foci of invasive lobular cancer up to 1.7 cm with negative lymph nodes. She had chemotherapy and left sided XRT. She has been followed regularly in the cancer clinic by Dr. [name]. She has chronic lymphedema of her left arm. She has had cellulitis requiring hospitalization within the last year. She remains on tamoxifen.   She recently saw Dr. [name] with concerns about a knot on her left chest wall above her mastectomy scar. This has become painful and seems to be enlarging a little. She is on methadone for post chemotherapy pain syndrome. She is getting new left arm sleeves for her lymphedema.    Medication History Carrolyn Leigh, [place]; 02/24/2021 1:52 PM) Metoprolol Succinate ER (50MG  Tablet ER 24HR, Oral) Active. Hydrocodone-Acetaminophen (10-325MG  Tablet, Oral) Active. Hydrocodone-Acetaminophen (10-300MG  Tablet, Oral) Active. Ibuprofen (600MG  Tablet, Oral) Active. Atorvastatin Calcium (40MG  Tablet, Oral) Active. Benzonatate (100MG  Capsule, Oral) Active. Isosorbide Mononitrate ER (60MG  Tablet ER 24HR, Oral) Active. Metoprolol Succinate ER (100MG  Tablet ER 24HR, Oral) Active. Nitroglycerin (0.4MG  Tab Sublingual, Sublingual) Active. Aspirin (325MG  Tablet, Oral) Active. Triamterene-HCTZ (75-50MG  Tablet, Oral) Active.    Review of Systems Stark Klein MD; 02/24/2021 2:35 PM) All other  systems negative   Physical Exam Stark Klein MD; 02/24/2021 2:37 PM) General Mental Status-Alert. General Appearance-Consistent with stated age. Hydration-Well hydrated. Voice-Normal.  Head and Neck Head-normocephalic, atraumatic with no lesions or palpable masses.  Eye Sclera/Conjunctiva - Bilateral-No scleral icterus.  Chest and Lung Exam Chest and lung exam reveals -quiet, even and easy respiratory effort with no use of accessory muscles. Inspection Chest Wall - Normal. Back - normal.  Breast Note: both breasts are surgically absent. She has a firm nodule just below the skin around 1-2 cm above the left mastectomy scar approximately 4 cm from the medial aspect of the scar. The mass is around 2-3 cm and feels like it is not completely mobile. There are no other masses and no LAD on either side. She has significant left arm lymphedema.   Cardiovascular Cardiovascular examination reveals -normal pedal pulses bilaterally. Note: regular rate and rhythm  Abdomen Inspection-Inspection Normal. Palpation/Percussion Palpation and Percussion of the abdomen reveal - Soft, Non Tender, No Rebound tenderness, No Rigidity (guarding) and No hepatosplenomegaly.  Peripheral Vascular Upper Extremity Inspection - Bilateral - Normal - No Clubbing, No Cyanosis, No Edema, Pulses Intact. Lower Extremity Palpation - Edema - Bilateral - No edema - Bilateral.  Neurologic Neurologic evaluation reveals -alert and oriented x 3 with no impairment of recent or remote memory. Mental Status-Normal.  Musculoskeletal Global Assessment -Note: no gross deformities.  Normal Exam - Left-Upper Extremity Strength Normal and Lower Extremity Strength Normal. Normal Exam - Right-Upper Extremity Strength Normal and Lower Extremity Strength Normal.  Lymphatic Head & Neck  General Head & Neck Lymphatics: Bilateral - Description - Normal. Axillary  General Axillary Region:  Bilateral - Description - Normal. Tenderness - Non Tender.    Assessment &  Plan Stark Klein MD; 02/24/2021 2:39 PM) NODULE OF LEFT ANTERIOR CHEST WALL (R22.2) Impression: I am concerned about cancer recurrence given that this is tender and very firm.  I advised that I would make a new incision as I think the overlying skin also needs to be removed.  I discussed risks of surgery including bleeding and infection as the most common. I discussed that she could do choice of sedation vs general. I may need to take some underlying muscle if it is tethered.  This will be scheduled expeditiously. Current Plans You are being scheduled for surgery- Our schedulers will call you.  You should hear from our office's scheduling department within 5 working days about the location, date, and time of surgery. We try to make accommodations for patient's preferences in scheduling surgery, but sometimes the OR schedule or the surgeon's schedule prevents [name] from making those accommodations.  If you have not heard from our office [phone]) in 5 working days, call the office and ask for your surgeon's nurse.  If you have other questions about your diagnosis, plan, or surgery, call the office and ask for your surgeon's nurse.  BILATERAL MALIGNANT NEOPLASM OF BREAST IN FEMALE, UNSPECIFIED SITE OF BREAST Impression: History of bilateral breast cancer, locally advanced stage III on the left, status post bilateral mastectomy, status post chemotherapy and trastuzumab and tamoxifen.  This nodule may reflect recurrence. If so, she may need additional chemo vs different targeted therapy. LYMPHEDEMA OF LEFT ARM (I89.0) Impression: this is being addressed already by PT.    Signed electronically by Stark Klein, MD (02/24/2021 2:40 PM)

## 2021-03-12 ENCOUNTER — Ambulatory Visit: Payer: Medicaid Other | Admitting: Oncology

## 2021-03-12 ENCOUNTER — Inpatient Hospital Stay: Payer: Medicaid Other | Admitting: Oncology

## 2021-03-12 ENCOUNTER — Other Ambulatory Visit: Payer: Medicaid Other

## 2021-03-12 ENCOUNTER — Inpatient Hospital Stay: Payer: Medicaid Other

## 2021-03-12 ENCOUNTER — Telehealth: Payer: Self-pay | Admitting: Oncology

## 2021-03-12 DIAGNOSIS — S8262XA Displaced fracture of lateral malleolus of left fibula, initial encounter for closed fracture: Secondary | ICD-10-CM | POA: Diagnosis not present

## 2021-03-12 NOTE — Telephone Encounter (Signed)
Per sch msg, patient can not make it to her appt today. Called and spoke with patient. Confirmed new date and time

## 2021-03-13 ENCOUNTER — Other Ambulatory Visit (HOSPITAL_COMMUNITY): Payer: Medicaid Other

## 2021-03-13 DIAGNOSIS — S8262XA Displaced fracture of lateral malleolus of left fibula, initial encounter for closed fracture: Secondary | ICD-10-CM | POA: Diagnosis not present

## 2021-03-13 NOTE — Progress Notes (Signed)
Sent text reminding pt to go for covid testing.

## 2021-03-14 DIAGNOSIS — S8262XA Displaced fracture of lateral malleolus of left fibula, initial encounter for closed fracture: Secondary | ICD-10-CM | POA: Diagnosis not present

## 2021-03-15 DIAGNOSIS — S8262XA Displaced fracture of lateral malleolus of left fibula, initial encounter for closed fracture: Secondary | ICD-10-CM | POA: Diagnosis not present

## 2021-03-16 ENCOUNTER — Ambulatory Visit (HOSPITAL_BASED_OUTPATIENT_CLINIC_OR_DEPARTMENT_OTHER): Admission: RE | Admit: 2021-03-16 | Payer: Medicaid Other | Source: Home / Self Care | Admitting: General Surgery

## 2021-03-16 ENCOUNTER — Ambulatory Visit: Payer: Medicaid Other | Admitting: Podiatry

## 2021-03-16 DIAGNOSIS — S8262XA Displaced fracture of lateral malleolus of left fibula, initial encounter for closed fracture: Secondary | ICD-10-CM | POA: Diagnosis not present

## 2021-03-16 SURGERY — BREAST BIOPSY
Anesthesia: Choice | Laterality: Left

## 2021-03-17 ENCOUNTER — Ambulatory Visit
Admission: RE | Admit: 2021-03-17 | Discharge: 2021-03-17 | Disposition: A | Discharge status: Home / Self Care | Payer: Medicaid Other | Source: Ambulatory Visit | Attending: Oncology | Admitting: Oncology

## 2021-03-17 ENCOUNTER — Ambulatory Visit
Admission: RE | Admit: 2021-03-17 | Discharge: 2021-03-17 | Disposition: A | Discharge status: Home / Self Care | Payer: Medicaid Other | Source: Ambulatory Visit | Attending: Family Medicine | Admitting: Family Medicine

## 2021-03-17 ENCOUNTER — Other Ambulatory Visit: Payer: Self-pay

## 2021-03-17 DIAGNOSIS — C50811 Malignant neoplasm of overlapping sites of right female breast: Secondary | ICD-10-CM

## 2021-03-17 DIAGNOSIS — J929 Pleural plaque without asbestos: Secondary | ICD-10-CM | POA: Diagnosis not present

## 2021-03-17 DIAGNOSIS — I2510 Atherosclerotic heart disease of native coronary artery without angina pectoris: Secondary | ICD-10-CM | POA: Diagnosis not present

## 2021-03-17 DIAGNOSIS — S8262XA Displaced fracture of lateral malleolus of left fibula, initial encounter for closed fracture: Secondary | ICD-10-CM | POA: Diagnosis not present

## 2021-03-17 DIAGNOSIS — C50412 Malignant neoplasm of upper-outer quadrant of left female breast: Secondary | ICD-10-CM

## 2021-03-17 DIAGNOSIS — R221 Localized swelling, mass and lump, neck: Secondary | ICD-10-CM

## 2021-03-17 DIAGNOSIS — Z853 Personal history of malignant neoplasm of breast: Secondary | ICD-10-CM | POA: Diagnosis not present

## 2021-03-17 DIAGNOSIS — L989 Disorder of the skin and subcutaneous tissue, unspecified: Secondary | ICD-10-CM | POA: Diagnosis not present

## 2021-03-17 MED ORDER — IOPAMIDOL (ISOVUE-300) INJECTION 61%
100.0000 mL | Freq: Once | INTRAVENOUS | Status: AC | PRN
  Administered 2021-03-17: 100 mL via INTRAVENOUS

## 2021-03-18 ENCOUNTER — Other Ambulatory Visit: Payer: Self-pay | Admitting: Oncology

## 2021-03-18 ENCOUNTER — Ambulatory Visit (INDEPENDENT_AMBULATORY_CARE_PROVIDER_SITE_OTHER): Payer: Medicaid Other | Admitting: Podiatry

## 2021-03-18 ENCOUNTER — Telehealth: Payer: Self-pay

## 2021-03-18 ENCOUNTER — Ambulatory Visit (INDEPENDENT_AMBULATORY_CARE_PROVIDER_SITE_OTHER): Payer: Medicaid Other

## 2021-03-18 DIAGNOSIS — S82892G Other fracture of left lower leg, subsequent encounter for closed fracture with delayed healing: Secondary | ICD-10-CM

## 2021-03-18 DIAGNOSIS — M205X2 Other deformities of toe(s) (acquired), left foot: Secondary | ICD-10-CM | POA: Diagnosis not present

## 2021-03-18 DIAGNOSIS — S8262XA Displaced fracture of lateral malleolus of left fibula, initial encounter for closed fracture: Secondary | ICD-10-CM | POA: Diagnosis not present

## 2021-03-18 DIAGNOSIS — C50811 Malignant neoplasm of overlapping sites of right female breast: Secondary | ICD-10-CM
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
## 2021-03-18 NOTE — Telephone Encounter (Signed)
Pt called and would like to confirm appt for 03/24/21. This LPN verified appt date/time with pt; verbalized thanks and understanding.

## 2021-03-19 ENCOUNTER — Other Ambulatory Visit: Payer: Self-pay | Admitting: *Deleted

## 2021-03-19 ENCOUNTER — Other Ambulatory Visit: Payer: Self-pay | Admitting: Oncology

## 2021-03-19 DIAGNOSIS — C50412 Malignant neoplasm of upper-outer quadrant of left female breast: Secondary | ICD-10-CM

## 2021-03-19 DIAGNOSIS — S8262XA Displaced fracture of lateral malleolus of left fibula, initial encounter for closed fracture: Secondary | ICD-10-CM | POA: Diagnosis not present

## 2021-03-20 DIAGNOSIS — S8262XA Displaced fracture of lateral malleolus of left fibula, initial encounter for closed fracture: Secondary | ICD-10-CM | POA: Diagnosis not present

## 2021-03-21 DIAGNOSIS — S8262XA Displaced fracture of lateral malleolus of left fibula, initial encounter for closed fracture: Secondary | ICD-10-CM | POA: Diagnosis not present

## 2021-03-22 DIAGNOSIS — S8262XA Displaced fracture of lateral malleolus of left fibula, initial encounter for closed fracture: Secondary | ICD-10-CM | POA: Diagnosis not present

## 2021-03-23 DIAGNOSIS — S8262XA Displaced fracture of lateral malleolus of left fibula, initial encounter for closed fracture: Secondary | ICD-10-CM | POA: Diagnosis not present
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
## 2021-03-24 ENCOUNTER — Inpatient Hospital Stay: Payer: Medicaid Other | Admitting: Oncology

## 2021-03-24 ENCOUNTER — Telehealth: Payer: Self-pay | Admitting: Oncology

## 2021-03-24 ENCOUNTER — Inpatient Hospital Stay: Payer: Medicaid Other

## 2021-03-24 ENCOUNTER — Telehealth: Payer: Self-pay

## 2021-03-24 DIAGNOSIS — S8262XA Displaced fracture of lateral malleolus of left fibula, initial encounter for closed fracture: Secondary | ICD-10-CM | POA: Diagnosis not present

## 2021-03-24 NOTE — Telephone Encounter (Signed)
Returned call to pt regarding appointment for today. Pt had an allergic reaction and is unable to come in today. Message sent to scheduling to change appointment to next available. Pt verbalized understanding.

## 2021-03-24 NOTE — Telephone Encounter (Signed)
Rescheduled today's appointment per 4/5 schedule message. Patient is aware of changes.

## 2021-03-25 ENCOUNTER — Other Ambulatory Visit: Payer: Self-pay

## 2021-03-25 DIAGNOSIS — S8262XA Displaced fracture of lateral malleolus of left fibula, initial encounter for closed fracture: Secondary | ICD-10-CM | POA: Diagnosis not present

## 2021-03-25 NOTE — Patient Instructions (Signed)
Visit Information  Ms. Filsaime was given information about Medicaid Managed Care team care coordination services as a part of their [place] Medicaid benefit. [name] verbally consented to engagement with the [facility] Managed Care team.   For questions related to your [facility], please call: [phone] or visit the homepage here: [email or web address]  If you would like to schedule transportation through your [facility], please call the following number at least 2 days in advance of your appointment: [phone].   Call the [place] at [phone], at any time, 24 hours a day, 7 days a week. If you are in danger or need immediate medical attention call 911.  Ms. Bugaj - following are the goals we discussed in your visit today:  Goals Addressed   None     Social Worker will follow up in 30 days.   Mickel Fuchs, BSW, [place]  High Risk Managed Medicaid Team    Following is a copy of your plan of care:

## 2021-03-25 NOTE — Patient Outreach (Signed)
Medicaid Managed Care Social Work Note  03/25/2021 Name:  [name] MRN:  [id number] DOB:  November 28, 1961  [name] is an 60 y.o. year old female who is a primary patient of Savannah Haw, MD.  The Medicaid Managed Care Coordination team was consulted for assistance with:  housing  [name] was given information about Medicaid Managed CareCoordination services today. [name] agreed to services and verbal consent obtained.  Engaged with patient  for by telephone forinitial visit in response to referral for case management and/or care coordination services.   Assessments/Interventions:  Review of past medical history, allergies, medications, health status, including review of consultants reports, laboratory and other test data, was performed as part of comprehensive evaluation and provision of chronic care management services.  SDOH: (Social Determinant of Health) assessments and interventions performed:  Patient contacted BSW back. Patient stated she is not in need of housing. She has a nice house. Patient stated she is in need of a dentist and an eye exam. BSW provided patient with [facility] [phone] and [place] [phone]   Advanced Directives Status:  Not addressed in this encounter.  Care Plan                 No Known Allergies  Medications Reviewed Today    Reviewed by [name] (Certified Medical Assistant) on 03/18/21 at 32  Med List Status: <None>  Medication Order Taking? Sig Documenting Provider Last Dose Status Informant  acetaminophen (TYLENOL) 500 MG tablet [id number] No Take 2 tablets (1,000 mg total) by mouth every 8 (eight) hours as needed. Savannah Dolly, MD Past Week Unknown time Active Self  allopurinol (ZYLOPRIM) 300 MG tablet [id number] No TAKE 1 TABLET BY MOUTH EVERY DAY Savannah Haw, MD 03/09/2021 Unknown time Active   amLODipine (NORVASC) 5 MG tablet [id number] No Take 1 tablet (5 mg  total) by mouth daily. Savannah Alu, MD 03/09/2021 Unknown time Active Self  ascorbic acid (VITAMIN C) 500 MG tablet [id number] No Take 500 mg by mouth daily at 10 pm. [provider] 03/09/2021 Unknown time Active Self  aspirin EC 81 MG tablet [id number] No Take 81 mg by mouth daily at 10 pm.  [provider] 03/08/2021 Unknown time Active Self  atorvastatin (LIPITOR) 40 MG tablet [id number] No Take 1 tablet (40 mg total) by mouth daily at 6 PM. Savannah Alu, MD 03/09/2021 Unknown time Active Self  camphor-menthol [facility]) lotion [id number] No Apply topically as needed for itching. Savannah Filler, MD Taking Active Self  ELDERBERRY PO [id number] No Take 1,000 mg by mouth daily at 10 pm. [provider] Past Week Unknown time Active Self        Discontinued 03/10/12 1404 (Error)   Ginkgo Biloba 120 MG TABS [id number] No Take 120 mg by mouth daily at 10 pm. [provider] Past Week Unknown time Active Self  isosorbide mononitrate (IMDUR) 60 MG 24 hr tablet [id number] No TAKE 1 TABLET (60 MG TOTAL) BY MOUTH AT BEDTIME. Savannah Alu, MD 03/08/2021 Unknown time Active Self  KLOR-CON M20 20 MEQ tablet [id number] No TAKE 1 TABLET (20 MEQ TOTAL) BY MOUTH 3 (THREE) TIMES DAILY. Savannah Haw, MD 03/08/2021 Unknown time Active   methadone (DOLOPHINE) 5 MG tablet [id number] No Three tablets every 8 hours for pain Magrinat, Virgie Dad, MD 03/09/2021 Unknown time Active   metoprolol succinate (TOPROL-XL) 50 MG 24 hr tablet [id number] No Take 2 tablets (100 mg total)  by mouth daily. Take with or immediately following a meal. Savannah Haw, MD 03/09/2021 Unknown time Active   nitroGLYCERIN (NITROSTAT) 0.4 MG SL tablet [id number] No Place 1 tablet (0.4 mg total) under the tongue every 5 (five) minutes as needed for chest pain. Savannah Dopp T, PA-C More than a month Unknown time Active Self  OVER THE COUNTER MEDICATION [id number] No Zinc 30 mg daily as needed [provider]  03/09/2021 Unknown time Active   OVER THE COUNTER MEDICATION [id number] No Ear drying drops - as needed [provider] Past Week Unknown time Active   tamoxifen (NOLVADEX) 20 MG tablet [id number] No Take 1 tablet (20 mg total) by mouth daily. Magrinat, Virgie Dad, MD 03/09/2021 Unknown time Active           Patient Active Problem List   Diagnosis Date Noted  . Prediabetes 01/01/2021  . Concern about ear disease without diagnosis 01/01/2021  . Neck mass 11/05/2020  . Arthritis of right knee 06/11/2020  . Acquired absence of both breasts 04/01/2020  . Left facial swelling 02/22/2020  . Left arm cellulitis 09/28/2019  . Cellulitis 09/28/2019  . Cellulitis of left arm   . Bilateral leg edema 06/21/2019  . Dysfunctional uterine bleeding 04/30/2019  . Right leg pain 09/13/2018  . Acquired trigger finger of right ring finger 05/29/2018  . Hyperlipidemia 02/08/2018  . History of ST elevation myocardial infarction (STEMI) 12/01/2017  . Endometrial hyperplasia, simple 11/08/2017  . Unilateral primary osteoarthritis, right knee 10/27/2017  . Abdominal mass 08/15/2017  . Hypertensive disorder 04/17/2016  . Dyslipidemia 04/17/2016  . CAD (coronary artery disease) 04/17/2016  . Thrombocytosis 03/10/2015  . Tobacco abuse 12/02/2014  . Cancer of overlapping sites of right female breast ([place]) 09/16/2014  . Breast cancer of upper-outer quadrant of left female breast ([place]) 09/16/2014  . Hot flashes due to tamoxifen 05/09/2014  . Lymphedema of arm 11/08/2013  . Chronic pain 03/31/2012  . Hypokalemia 03/31/2012  . Atherosclerosis of aorta ([place])     Conditions to be addressed/monitored per PCP order:  community resources  There are no care plans that you recently modified to display for this patient.   Follow up:  Patient agrees to Care Plan and Follow-up.  Plan: The Managed Medicaid care management team will reach out to the patient again over the next 30 days.  Date/time of next  scheduled Social Work care management/care coordination outreach:  04/24/21  [name], [place], [place] Medicaid Team

## 2021-03-26 ENCOUNTER — Ambulatory Visit: Payer: Medicaid Other | Admitting: Family Medicine

## 2021-03-26 ENCOUNTER — Other Ambulatory Visit (HOSPITAL_COMMUNITY): Payer: Self-pay

## 2021-03-26 DIAGNOSIS — S8262XA Displaced fracture of lateral malleolus of left fibula, initial encounter for closed fracture: Secondary | ICD-10-CM | POA: Diagnosis not present

## 2021-03-27 DIAGNOSIS — S8262XA Displaced fracture of lateral malleolus of left fibula, initial encounter for closed fracture: Secondary | ICD-10-CM | POA: Diagnosis not present

## 2021-03-28 DIAGNOSIS — S8262XA Displaced fracture of lateral malleolus of left fibula, initial encounter for closed fracture: Secondary | ICD-10-CM | POA: Diagnosis not present

## 2021-03-29 DIAGNOSIS — S8262XA Displaced fracture of lateral malleolus of left fibula, initial encounter for closed fracture: Secondary | ICD-10-CM | POA: Diagnosis not present

## 2021-03-30 DIAGNOSIS — S8262XA Displaced fracture of lateral malleolus of left fibula, initial encounter for closed fracture: Secondary | ICD-10-CM | POA: Diagnosis not present
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
## 2021-03-31 ENCOUNTER — Other Ambulatory Visit: Payer: Medicaid Other

## 2021-03-31 ENCOUNTER — Telehealth: Payer: Self-pay

## 2021-03-31 ENCOUNTER — Encounter (HOSPITAL_BASED_OUTPATIENT_CLINIC_OR_DEPARTMENT_OTHER): Payer: Self-pay | Admitting: General Surgery

## 2021-03-31 ENCOUNTER — Telehealth: Payer: Self-pay | Admitting: Oncology

## 2021-03-31 ENCOUNTER — Ambulatory Visit: Payer: Medicaid Other | Admitting: Oncology

## 2021-03-31 DIAGNOSIS — S8262XA Displaced fracture of lateral malleolus of left fibula, initial encounter for closed fracture: Secondary | ICD-10-CM | POA: Diagnosis not present

## 2021-03-31 NOTE — Telephone Encounter (Signed)
R/s appt per 4/12 sch msg. Pt aware.
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
## 2021-04-01 ENCOUNTER — Other Ambulatory Visit: Payer: Self-pay

## 2021-04-01 ENCOUNTER — Telehealth: Payer: Self-pay | Admitting: Oncology

## 2021-04-01 ENCOUNTER — Ambulatory Visit: Payer: Medicaid Other | Admitting: Obstetrics & Gynecology

## 2021-04-01 DIAGNOSIS — C50412 Malignant neoplasm of upper-outer quadrant of left female breast: Secondary | ICD-10-CM

## 2021-04-01 DIAGNOSIS — S8262XA Displaced fracture of lateral malleolus of left fibula, initial encounter for closed fracture: Secondary | ICD-10-CM | POA: Diagnosis not present

## 2021-04-01 NOTE — Telephone Encounter (Signed)
R/s appt per 4/12 sch msg. Pt aware.

## 2021-04-01 NOTE — Progress Notes (Signed)
Referral placed to [facility] rehab for lymphedema assessment. Pt is aware and verbalizes thanks and understanding.

## 2021-04-02 DIAGNOSIS — S8262XA Displaced fracture of lateral malleolus of left fibula, initial encounter for closed fracture: Secondary | ICD-10-CM | POA: Diagnosis not present

## 2021-04-03 DIAGNOSIS — S8262XA Displaced fracture of lateral malleolus of left fibula, initial encounter for closed fracture: Secondary | ICD-10-CM | POA: Diagnosis not present

## 2021-04-04 DIAGNOSIS — S8262XA Displaced fracture of lateral malleolus of left fibula, initial encounter for closed fracture: Secondary | ICD-10-CM | POA: Diagnosis not present

## 2021-04-05 DIAGNOSIS — S8262XA Displaced fracture of lateral malleolus of left fibula, initial encounter for closed fracture: Secondary | ICD-10-CM | POA: Diagnosis not present

## 2021-04-06 ENCOUNTER — Other Ambulatory Visit (HOSPITAL_COMMUNITY)
Admission: RE | Admit: 2021-04-06 | Discharge: 2021-04-06 | Disposition: A | Discharge status: Home / Self Care | Payer: Medicaid Other | Source: Ambulatory Visit | Attending: General Surgery | Admitting: General Surgery

## 2021-04-06 DIAGNOSIS — Z01812 Encounter for preprocedural laboratory examination: Secondary | ICD-10-CM | POA: Insufficient documentation

## 2021-04-06 DIAGNOSIS — Z20822 Contact with and (suspected) exposure to covid-19: Secondary | ICD-10-CM | POA: Diagnosis not present

## 2021-04-06 DIAGNOSIS — S8262XA Displaced fracture of lateral malleolus of left fibula, initial encounter for closed fracture: Secondary | ICD-10-CM | POA: Diagnosis not present

## 2021-04-06 LAB — SARS CORONAVIRUS 2 (TAT 6-24 HRS): SARS Coronavirus 2: NEGATIVE

## 2021-04-06 NOTE — Progress Notes (Signed)

## 2021-04-07 ENCOUNTER — Ambulatory Visit: Payer: Medicaid Other | Admitting: Oncology

## 2021-04-07 ENCOUNTER — Other Ambulatory Visit: Payer: Medicaid Other

## 2021-04-07 ENCOUNTER — Other Ambulatory Visit: Payer: Self-pay

## 2021-04-07 ENCOUNTER — Other Ambulatory Visit (HOSPITAL_COMMUNITY): Payer: Self-pay

## 2021-04-07 ENCOUNTER — Other Ambulatory Visit: Payer: Self-pay | Admitting: Oncology

## 2021-04-07 DIAGNOSIS — S8262XA Displaced fracture of lateral malleolus of left fibula, initial encounter for closed fracture: Secondary | ICD-10-CM | POA: Diagnosis not present

## 2021-04-07 DIAGNOSIS — C50412 Malignant neoplasm of upper-outer quadrant of left female breast: Secondary | ICD-10-CM

## 2021-04-07 MED ORDER — METHADONE HCL 5 MG PO TABS
ORAL_TABLET | ORAL | 0 refills | Status: DC
  Filled 2021-04-07: qty 270, 30d supply, fill #0

## 2021-04-07 NOTE — Telephone Encounter (Signed)
Last Rx given 3/21

## 2021-04-07 NOTE — Patient Outreach (Signed)
Medicaid Managed Care    Pharmacy Note  04/07/2021 Name: [name] MRN: [id number] DOB: 1961/03/05  [name] is a 60 y.o. year old female who is a primary care patient of Savannah Haw, MD. The [facility] Managed Care Coordination team was consulted for assistance with disease management and care coordination needs.    Engaged with patient Engaged with patient by telephone for initial visit in response to referral for case management and/or care coordination services.  [name] was given information about Managed Medicaid Care Coordination team services today. [name] agreed to services and verbal consent obtained.   Objective:  Lab Results  Component Value Date   CREATININE 0.82 02/09/2021   CREATININE 0.90 10/09/2020   CREATININE 0.83 09/11/2020    Lab Results  Component Value Date   HGBA1C 6.0 (A) 12/31/2020       Component Value Date/Time   CHOL 141 12/31/2020 1646   TRIG 134 12/31/2020 1646   HDL 50 12/31/2020 1646   CHOLHDL 2.8 12/31/2020 1646   CHOLHDL 3.9 11/18/2015 0138   VLDL 50 (H) 11/18/2015 0138   LDLCALC 68 12/31/2020 1646    Other: (TSH, CBC, Vit D, etc.)  Clinical ASCVD: Yes  The 10-year ASCVD risk score [name] DC Jr., et al., 2013) is: 19%   Values used to calculate the score:     Age: 38 years     Sex: Female     Is Non-Hispanic African American: Yes     Diabetic: Yes     Tobacco smoker: Yes     Systolic Blood Pressure: 332 mmHg     Is BP treated: Yes     HDL Cholesterol: 50 mg/dL     Total Cholesterol: 141 mg/dL    Other: (CHADS2VASc if Afib, PHQ9 if depression, MMRC or CAT for COPD, ACT, DEXA)  BP Readings from Last 3 Encounters:  02/09/21 127/72  12/31/20 128/84  11/04/20 125/60    Assessment/Interventions: Review of patient past medical history, allergies, medications, health status, including review of consultants reports, laboratory and other test data, was performed as part of comprehensive evaluation and  provision of chronic care management services.    Pain 01/01/21: With Meds: 3/10 and without: 7/10 -Methadone (PRN) Plan: At goal,  patient stable/ symptoms controlled    Cardio BP Readings from Last 3 Encounters:  02/09/21 127/72  12/31/20 128/84  11/04/20 125/60    Heart Attack in 2008 -Metoprolol Succ 50mg  -Amlodipine 5mg  -ISMN 60mg  HS Plan: At goal,  patient stable/ symptoms controlled.   Pre-DM Lab Results  Component Value Date   HGBA1C 6.0 (A) 12/31/2020   HGBA1C 5.8 09/01/2016   HGBA1C 6.4 (H) 11/18/2015   Lab Results  Component Value Date   MICROALBUR 0.51 07/31/2009   LDLCALC 68 12/31/2020   CREATININE 0.82 02/09/2021    Lab Results  Component Value Date   NA 141 02/09/2021   K 3.6 02/09/2021   CREATININE 0.82 02/09/2021   GFRNONAA >60 02/09/2021   GFRAA >60 09/11/2020   GLUCOSE 111 (H) 02/09/2021    -Spoke about diet -Country time lemonade -Can't work out but doing rehab soon (Foot is broken so can't do anything now) Plan: At goal,  patient stable/ symptoms controlled   Breast Cx Hx -Tamoxifen Plan: At goal,  patient stable/ symptoms controlled   Gout Lab Results  Component Value Date   LABURIC 3.9 09/29/2020    -Allopurinol 300mg  Plan: At goal,  patient stable/ symptoms controlled  Sx April  2022 -Patient has procedure tomorrow, 04/08/21, and she is nervous about it. Spoke most of the visit about the procedure and her concerns. Plan: She'd like me to f/u in 1 month (vs normal 3 months) and I believe it is because she's nervous about the procedure and wants someone to check in on her.    SDOH (Social Determinants of Health) assessments and interventions performed:    Care Plan  No Known Allergies  Medications Reviewed Today    Reviewed by Loa Socks, RN (Registered Nurse) on 03/31/21 at 1041  Med List Status: <None>  Medication Order Taking? Sig Documenting Provider Last Dose Status Informant  acetaminophen (TYLENOL) 500  MG tablet [id number] Yes Take 2 tablets (1,000 mg total) by mouth every 8 (eight) hours as needed. Carlyle Dolly, MD 03/30/2021 Unknown time Active Self  allopurinol (ZYLOPRIM) 300 MG tablet [id number] Yes TAKE 1 TABLET BY MOUTH EVERY DAY Savannah Haw, MD 03/30/2021 Unknown time Active   amLODipine (NORVASC) 5 MG tablet [id number] Yes Take 1 tablet (5 mg total) by mouth daily. [name], MD 03/30/2021 Unknown time Active Self  ascorbic acid (VITAMIN C) 500 MG tablet [id number] Yes Take 500 mg by mouth daily at 10 pm. [provider] 03/31/2021 Unknown time Active Self  aspirin EC 81 MG tablet [id number] Yes Take 81 mg by mouth daily at 10 pm.  [provider] 03/31/2021 Active Self  atorvastatin (LIPITOR) 40 MG tablet [id number] Yes Take 1 tablet (40 mg total) by mouth daily at 6 PM. [name], MD 03/30/2021 Unknown time Active Self  camphor-menthol Timoteo Ace) lotion [id number] Yes Apply topically as needed for itching. Eugenie Filler, MD 03/30/2021 Unknown time Active Self  ELDERBERRY PO [id number] Yes Take 1,000 mg by mouth daily at 10 pm. [provider] 03/31/2021 Unknown time Active Self        Discontinued 03/10/12 1404 (Error)   Ginkgo Biloba 120 MG TABS [id number] Yes Take 120 mg by mouth daily at 10 pm. [provider] 03/31/2021 Unknown time Active Self  isosorbide mononitrate (IMDUR) 60 MG 24 hr tablet [id number] Yes TAKE 1 TABLET (60 MG TOTAL) BY MOUTH AT BEDTIME. [name], MD 03/30/2021 Unknown time Active Self  KLOR-CON M20 20 MEQ tablet [id number] Yes TAKE 1 TABLET (20 MEQ TOTAL) BY MOUTH 3 (THREE) TIMES DAILY. Savannah Haw, MD 03/31/2021 Unknown time Active   methadone (DOLOPHINE) 5 MG tablet [id number] Yes TAKE 3 TABLETS BY MOUTH EVERY 8 HOURS AS NEEDED FOR PAIN Magrinat, Virgie Dad, MD 03/30/2021 Unknown time Active   methadone (DOLOPHINE) 5 MG tablet [id number] Yes TAKE 3 TABLETS BY MOUTH EVERY 8 HOURS FOR PAIN Gardenia Phlegm, NP  03/30/2021 Unknown time Active   metoprolol succinate (TOPROL-XL) 50 MG 24 hr tablet [id number] Yes Take 2 tablets (100 mg total) by mouth daily. Take with or immediately following a meal. Savannah Haw, MD 03/30/2021 Unknown time Active   nitroGLYCERIN (NITROSTAT) 0.4 MG SL tablet [id number] Yes Place 1 tablet (0.4 mg total) under the tongue every 5 (five) minutes as needed for chest pain. Richardson Dopp T, PA-C 03/30/2021 Unknown time Active Self  OVER THE COUNTER MEDICATION [id number]  Zinc 30 mg daily as needed [provider]  Active   OVER THE COUNTER MEDICATION [id number]  Ear drying drops - as needed [provider]  Active   tamoxifen (NOLVADEX) 20 MG tablet [id number] Yes Take 1 tablet (20 mg total) by mouth daily. Magrinat, Virgie Dad, MD 03/30/2021 Unknown time  Active           Patient Active Problem List   Diagnosis Date Noted  . Prediabetes 01/01/2021  . Concern about ear disease without diagnosis 01/01/2021  . Neck mass 11/05/2020  . Arthritis of right knee 06/11/2020  . Acquired absence of both breasts 04/01/2020  . Left facial swelling 02/22/2020  . Left arm cellulitis 09/28/2019  . Cellulitis 09/28/2019  . Cellulitis of left arm   . Bilateral leg edema 06/21/2019  . Dysfunctional uterine bleeding 04/30/2019  . Right leg pain 09/13/2018  . Acquired trigger finger of right ring finger 05/29/2018  . Hyperlipidemia 02/08/2018  . History of ST elevation myocardial infarction (STEMI) 12/01/2017  . Endometrial hyperplasia, simple 11/08/2017  . Unilateral primary osteoarthritis, right knee 10/27/2017  . Abdominal mass 08/15/2017  . Hypertensive disorder 04/17/2016  . Dyslipidemia 04/17/2016  . CAD (coronary artery disease) 04/17/2016  . Thrombocytosis 03/10/2015  . Tobacco abuse 12/02/2014  . Cancer of overlapping sites of right female breast ([place]) 09/16/2014  . Breast cancer of upper-outer quadrant of left female breast ([place]) 09/16/2014  . Hot flashes due to  tamoxifen 05/09/2014  . Lymphedema of arm 11/08/2013  . Chronic pain 03/31/2012  . Hypokalemia 03/31/2012  . Atherosclerosis of aorta ([place])     Conditions to be addressed/monitored: CAD, HTN and DM   Care Plan : Medication Management  Updates made by Lane Hacker, [place] since 01/01/2021 12:00 AM    Problem: Health Promotion or Disease Self-Management (General Plan of Care)     Long-Range Goal: Medication Management   This Visit's Progress: On track  Priority: High  Note:   Current Barriers:  . Patient doesn't know what meds are for and specifics or generalities about disease states . Patient broke foot recently and is unable to ambulate. This is causing a lot of stress  Pharmacist Clinical Goal(s):  Marland Kitchen Over the next 90 days, patient will contact provider office for questions/concerns as evidenced notation of same in electronic health record through collaboration with PharmD and provider.  .   Interventions: . Inter-disciplinary care team collaboration (see longitudinal plan of care) . Comprehensive medication review performed; medication list updated in electronic medical record  @RXCPDIABETES @ @RXCPHYPERTENSION @ @RXCPHYPERLIPIDEMIA @  Patient Goals/Self-Care Activities . Over the next 90 days, patient will:  - take medications as prescribed  Follow Up Plan: The care management team will reach out to the patient again over the next 90 days.     Task: Mutually Develop and Royce Macadamia Achievement of Patient Goals   Note:   Care Management Activities:        Notes:      Medication Assistance: None required. Patient affirms current coverage meets needs.   Follow up: Agree Plan: The care management team will reach out to the patient again over the next 90 days.   [place] Constable, Pharm.D., Managed Medicaid Pharmacist - [phone]

## 2021-04-07 NOTE — Patient Instructions (Signed)
Visit Information  Ms. Walmer was given information about Medicaid Managed Care team care coordination services as a part of their [place] Medicaid benefit. [name] verbally consented to engagement with the [facility] Managed Care team.   For questions related to your [facility], please call: [phone] or visit the homepage here: [email or web address]  If you would like to schedule transportation through your [facility], please call the following number at least 2 days in advance of your appointment: [phone].   Call the [place] at [phone], at any time, 24 hours a day, 7 days a week. If you are in danger or need immediate medical attention call 911.  Ms. Munroe - following are the goals we discussed in your visit today:  Goals Addressed   None     Please see education materials related to HTN provided as print materials.   Patient verbalizes understanding of instructions provided today.   The Managed Medicaid care management team will reach out to the patient again over the next 30 days.   [place] Constable, Pharm.D., Managed Medicaid Pharmacist [phone]   Following is a copy of your plan of care:  Patient Care Plan: General Plan of Care (Adult)    Problem Identified: Health Promotion or Disease Self-Management (General Plan of Care)   Priority: High  Onset Date: 12/30/2020    Problem Identified: Harm or Injury     Long-Range Goal: Harm or Injury Prevented   Start Date: 12/30/2020  Expected End Date: 03/30/2021  This Visit's Progress: On track  Priority: High  Note:   Current Barriers:  . Patient with recent fall and fracture requiring surgery.  Nurse Case Manager Clinical Goal(s):  Marland Kitchen Over the next 30 days, patient will verbalize understanding of plan for surgery . Over the next 30 days, patient will work  with provider to address needs related to fracture. . Over the next 30 days, patient will attend all scheduled medical appointments:  Interventions:  . Inter-disciplinary care team collaboration (see longitudinal plan of care) . Evaluation of current treatment plan related to fracture and patient's adherence to plan as established by provider. . Reviewed medications with patient. . Discussed plans with patient for ongoing care management follow up and provided patient with direct contact information for care management team . Pharmacy referral for medication review.  Patient Goals/Self-Care Activities Over the next 30 days, patient will:  -Attends all scheduled provider appointments Calls provider office for new concerns or questions  Follow Up Plan: The Managed Medicaid care management team will reach out to the patient again over the next 30 days.  The patient has been provided with contact information for the Managed Medicaid care management team and has been advised to call with any health related questions or concerns.       Task: Identify and Reduce Risks for Harm or Injury   Note:   Care Management Activities:    - modification of home and work environment promoted    Notes:    Patient Care Plan: Medication Management    Problem Identified: Health Promotion or Disease Self-Management (General Plan of Care)     Long-Range Goal: Medication Management   This Visit's Progress: On track  Priority: High  Note:   Current Barriers:  . Patient doesn't know what meds are for and specifics or generalities about disease states . Patient broke foot recently and is unable to ambulate. This is causing a  lot of stress  Pharmacist Clinical Goal(s):  Marland Kitchen Over the next 90 days, patient will contact provider office for questions/concerns as evidenced notation of same in electronic health record through collaboration with PharmD and provider.  .   Interventions: . Inter-disciplinary care  team collaboration (see longitudinal plan of care) . Comprehensive medication review performed; medication list updated in electronic medical record  @RXCPDIABETES @ @RXCPHYPERTENSION @ @RXCPHYPERLIPIDEMIA @  Patient Goals/Self-Care Activities . Over the next 90 days, patient will:  - take medications as prescribed  Follow Up Plan: The care management team will reach out to the patient again over the next 90 days.     Task: Mutually Develop and Royce Macadamia Achievement of Patient Goals   Note:   Care Management Activities:    - verbalization of feelings encouraged    Notes:

## 2021-04-08 ENCOUNTER — Other Ambulatory Visit (HOSPITAL_COMMUNITY): Payer: Self-pay

## 2021-04-08 ENCOUNTER — Encounter (HOSPITAL_BASED_OUTPATIENT_CLINIC_OR_DEPARTMENT_OTHER)
Admission: RE | Disposition: A | Discharge status: Home / Self Care | Payer: Self-pay | Source: Home / Self Care | Attending: General Surgery

## 2021-04-08 ENCOUNTER — Ambulatory Visit (HOSPITAL_BASED_OUTPATIENT_CLINIC_OR_DEPARTMENT_OTHER): Payer: Medicaid Other | Admitting: Anesthesiology

## 2021-04-08 ENCOUNTER — Other Ambulatory Visit: Payer: Self-pay

## 2021-04-08 ENCOUNTER — Encounter (HOSPITAL_BASED_OUTPATIENT_CLINIC_OR_DEPARTMENT_OTHER): Payer: Self-pay | Admitting: General Surgery

## 2021-04-08 ENCOUNTER — Ambulatory Visit (HOSPITAL_BASED_OUTPATIENT_CLINIC_OR_DEPARTMENT_OTHER)
Admission: RE | Admit: 2021-04-08 | Discharge: 2021-04-08 | Disposition: A | Discharge status: Home / Self Care | Payer: Medicaid Other | Attending: General Surgery | Admitting: General Surgery

## 2021-04-08 DIAGNOSIS — I110 Hypertensive heart disease with heart failure: Secondary | ICD-10-CM | POA: Diagnosis not present

## 2021-04-08 DIAGNOSIS — I890 Lymphedema, not elsewhere classified: Secondary | ICD-10-CM | POA: Insufficient documentation

## 2021-04-08 DIAGNOSIS — S8262XA Displaced fracture of lateral malleolus of left fibula, initial encounter for closed fracture: Secondary | ICD-10-CM | POA: Diagnosis not present

## 2021-04-08 DIAGNOSIS — L02213 Cutaneous abscess of chest wall: Secondary | ICD-10-CM | POA: Diagnosis not present

## 2021-04-08 DIAGNOSIS — Z9221 Personal history of antineoplastic chemotherapy: Secondary | ICD-10-CM | POA: Diagnosis not present

## 2021-04-08 DIAGNOSIS — Z9013 Acquired absence of bilateral breasts and nipples: Secondary | ICD-10-CM | POA: Insufficient documentation

## 2021-04-08 DIAGNOSIS — Z79899 Other long term (current) drug therapy: Secondary | ICD-10-CM | POA: Diagnosis not present

## 2021-04-08 DIAGNOSIS — Z923 Personal history of irradiation: Secondary | ICD-10-CM | POA: Diagnosis not present

## 2021-04-08 DIAGNOSIS — M7989 Other specified soft tissue disorders: Secondary | ICD-10-CM | POA: Diagnosis not present

## 2021-04-08 DIAGNOSIS — I2510 Atherosclerotic heart disease of native coronary artery without angina pectoris: Secondary | ICD-10-CM | POA: Diagnosis not present

## 2021-04-08 DIAGNOSIS — Z79810 Long term (current) use of selective estrogen receptor modulators (SERMs): Secondary | ICD-10-CM | POA: Diagnosis not present

## 2021-04-08 DIAGNOSIS — Z853 Personal history of malignant neoplasm of breast: Secondary | ICD-10-CM | POA: Insufficient documentation

## 2021-04-08 DIAGNOSIS — I509 Heart failure, unspecified: Secondary | ICD-10-CM | POA: Diagnosis not present

## 2021-04-08 DIAGNOSIS — R222 Localized swelling, mass and lump, trunk: Secondary | ICD-10-CM | POA: Diagnosis not present

## 2021-04-08 HISTORY — PX: MASS EXCISION: SHX2000

## 2021-04-08 SURGERY — EXCISION MASS
Anesthesia: General | Site: Chest | Laterality: Left

## 2021-04-08 MED ORDER — LACTATED RINGERS IV SOLN: INTRAVENOUS | Status: DC

## 2021-04-08 MED ORDER — FENTANYL CITRATE (PF) 100 MCG/2ML IJ SOLN
INTRAMUSCULAR | Status: AC
  Filled 2021-04-08: qty 2

## 2021-04-08 MED ORDER — BACITRACIN ZINC 500 UNIT/GM EX OINT
TOPICAL_OINTMENT | CUTANEOUS | Status: DC | PRN
  Administered 2021-04-08: 1 via TOPICAL

## 2021-04-08 MED ORDER — CEFAZOLIN SODIUM-DEXTROSE 2-4 GM/100ML-% IV SOLN
2.0000 g | INTRAVENOUS | Status: AC
  Administered 2021-04-08: 2 g via INTRAVENOUS

## 2021-04-08 MED ORDER — DROPERIDOL 2.5 MG/ML IJ SOLN
INTRAMUSCULAR | Status: DC | PRN
  Administered 2021-04-08: .625 mg via INTRAVENOUS

## 2021-04-08 MED ORDER — OXYCODONE HCL 5 MG/5ML PO SOLN: 5.0000 mg | Freq: Once | ORAL | Status: DC | PRN

## 2021-04-08 MED ORDER — PHENYLEPHRINE HCL (PRESSORS) 10 MG/ML IV SOLN
INTRAVENOUS | Status: DC | PRN
  Administered 2021-04-08 (×2): 40 ug via INTRAVENOUS

## 2021-04-08 MED ORDER — CHLORHEXIDINE GLUCONATE CLOTH 2 % EX PADS: 6.0000 | MEDICATED_PAD | Freq: Once | CUTANEOUS | Status: DC

## 2021-04-08 MED ORDER — FENTANYL CITRATE (PF) 100 MCG/2ML IJ SOLN
INTRAMUSCULAR | Status: DC | PRN
  Administered 2021-04-08: 100 ug via INTRAVENOUS

## 2021-04-08 MED ORDER — OXYCODONE HCL 5 MG PO TABS
5.0000 mg | ORAL_TABLET | Freq: Four times a day (QID) | ORAL | 0 refills | Status: DC | PRN
  Filled 2021-04-08: qty 40, 5d supply, fill #0

## 2021-04-08 MED ORDER — ACETAMINOPHEN 500 MG PO TABS
1000.0000 mg | ORAL_TABLET | ORAL | Status: AC
  Administered 2021-04-08: 1000 mg via ORAL

## 2021-04-08 MED ORDER — OXYCODONE HCL 5 MG PO TABS: 5.0000 mg | ORAL_TABLET | Freq: Once | ORAL | Status: DC | PRN

## 2021-04-08 MED ORDER — FENTANYL CITRATE (PF) 100 MCG/2ML IJ SOLN: 25.0000 ug | INTRAMUSCULAR | Status: DC | PRN

## 2021-04-08 MED ORDER — MEPERIDINE HCL 25 MG/ML IJ SOLN: 6.2500 mg | INTRAMUSCULAR | Status: DC | PRN

## 2021-04-08 MED ORDER — DEXAMETHASONE SODIUM PHOSPHATE 4 MG/ML IJ SOLN
INTRAMUSCULAR | Status: DC | PRN
  Administered 2021-04-08: 5 mg via INTRAVENOUS

## 2021-04-08 MED ORDER — PHENYLEPHRINE 40 MCG/ML (10ML) SYRINGE FOR IV PUSH (FOR BLOOD PRESSURE SUPPORT)
PREFILLED_SYRINGE | INTRAVENOUS | Status: AC
  Filled 2021-04-08: qty 10

## 2021-04-08 MED ORDER — DEXAMETHASONE SODIUM PHOSPHATE 10 MG/ML IJ SOLN
INTRAMUSCULAR | Status: AC
  Filled 2021-04-08: qty 1

## 2021-04-08 MED ORDER — ONDANSETRON HCL 4 MG/2ML IJ SOLN
INTRAMUSCULAR | Status: AC
  Filled 2021-04-08: qty 2

## 2021-04-08 MED ORDER — PROMETHAZINE HCL 25 MG/ML IJ SOLN: 6.2500 mg | INTRAMUSCULAR | Status: DC | PRN

## 2021-04-08 MED ORDER — EPHEDRINE 5 MG/ML INJ
INTRAVENOUS | Status: AC
  Filled 2021-04-08: qty 10

## 2021-04-08 MED ORDER — MIDAZOLAM HCL 2 MG/2ML IJ SOLN
INTRAMUSCULAR | Status: AC
  Filled 2021-04-08: qty 2

## 2021-04-08 MED ORDER — LIDOCAINE 2% (20 MG/ML) 5 ML SYRINGE
INTRAMUSCULAR | Status: DC | PRN
  Administered 2021-04-08: 100 mg via INTRAVENOUS

## 2021-04-08 MED ORDER — ONDANSETRON HCL 4 MG/2ML IJ SOLN
INTRAMUSCULAR | Status: DC | PRN
  Administered 2021-04-08: 4 mg via INTRAVENOUS

## 2021-04-08 MED ORDER — LIDOCAINE HCL 1 % IJ SOLN
INTRAMUSCULAR | Status: DC | PRN
  Administered 2021-04-08: 20 mL

## 2021-04-08 MED ORDER — BACITRACIN ZINC 500 UNIT/GM EX OINT
TOPICAL_OINTMENT | CUTANEOUS | Status: AC
  Filled 2021-04-08: qty 28.35

## 2021-04-08 MED ORDER — ACETAMINOPHEN 500 MG PO TABS
ORAL_TABLET | ORAL | Status: AC
  Filled 2021-04-08: qty 2

## 2021-04-08 MED ORDER — LIDOCAINE 2% (20 MG/ML) 5 ML SYRINGE
INTRAMUSCULAR | Status: AC
  Filled 2021-04-08: qty 5

## 2021-04-08 MED ORDER — EPHEDRINE SULFATE 50 MG/ML IJ SOLN
INTRAMUSCULAR | Status: DC | PRN
  Administered 2021-04-08: 10 mg via INTRAVENOUS

## 2021-04-08 MED ORDER — LIDOCAINE HCL (CARDIAC) PF 100 MG/5ML IV SOSY
PREFILLED_SYRINGE | INTRAVENOUS | Status: DC | PRN
  Administered 2021-04-08: 100 mg via INTRAVENOUS

## 2021-04-08 MED ORDER — PROPOFOL 10 MG/ML IV BOLUS
INTRAVENOUS | Status: DC | PRN
  Administered 2021-04-08: 160 mg via INTRAVENOUS
  Administered 2021-04-08: 200 mg via INTRAVENOUS

## 2021-04-08 MED ORDER — SUCCINYLCHOLINE CHLORIDE 200 MG/10ML IV SOSY
PREFILLED_SYRINGE | INTRAVENOUS | Status: AC
  Filled 2021-04-08: qty 10

## 2021-04-08 MED ORDER — MIDAZOLAM HCL 5 MG/5ML IJ SOLN
INTRAMUSCULAR | Status: DC | PRN
  Administered 2021-04-08: 2 mg via INTRAVENOUS

## 2021-04-08 SURGICAL SUPPLY — 53 items
ADH SKN CLS APL DERMABOND .7 (GAUZE/BANDAGES/DRESSINGS) ×1
APL PRP STRL LF DISP 70% ISPRP (MISCELLANEOUS) ×1
BINDER BREAST XXLRG (GAUZE/BANDAGES/DRESSINGS) ×1 IMPLANT
BLADE HEX COATED 2.75 (ELECTRODE) ×1 IMPLANT
BLADE SURG 10 STRL SS (BLADE) ×1 IMPLANT
BLADE SURG 15 STRL LF DISP TIS (BLADE) ×1 IMPLANT
BLADE SURG 15 STRL SS (BLADE) ×2
CANISTER SUCT 1200ML W/VALVE (MISCELLANEOUS) IMPLANT
CHLORAPREP W/TINT 26 (MISCELLANEOUS) ×2 IMPLANT
COVER BACK TABLE 60X90IN (DRAPES) ×1 IMPLANT
COVER MAYO STAND STRL (DRAPES) ×1 IMPLANT
COVER WAND RF STERILE (DRAPES) IMPLANT
DECANTER SPIKE VIAL GLASS SM (MISCELLANEOUS) IMPLANT
DERMABOND ADVANCED (GAUZE/BANDAGES/DRESSINGS) ×1
DERMABOND ADVANCED .7 DNX12 (GAUZE/BANDAGES/DRESSINGS) ×1 IMPLANT
DRAPE LAPAROSCOPIC ABDOMINAL (DRAPES) ×1 IMPLANT
DRAPE LAPAROTOMY 100X72 PEDS (DRAPES) IMPLANT
DRAPE UTILITY XL STRL (DRAPES) ×2 IMPLANT
DRSG PAD ABDOMINAL 8X10 ST (GAUZE/BANDAGES/DRESSINGS) ×1 IMPLANT
ELECT REM PT RETURN 9FT ADLT (ELECTROSURGICAL) ×2
ELECTRODE REM PT RTRN 9FT ADLT (ELECTROSURGICAL) ×1 IMPLANT
GAUZE SPONGE 4X4 12PLY STRL LF (GAUZE/BANDAGES/DRESSINGS) ×2 IMPLANT
GLOVE SURG ENC MOIS LTX SZ6 (GLOVE) ×1 IMPLANT
GLOVE SURG ENC MOIS LTX SZ6.5 (GLOVE) ×1 IMPLANT
GLOVE SURG POLYISO LF SZ7 (GLOVE) ×1 IMPLANT
GLOVE SURG UNDER POLY LF SZ6.5 (GLOVE) ×2 IMPLANT
GLOVE SURG UNDER POLY LF SZ7 (GLOVE) ×2 IMPLANT
GOWN STRL REUS W/ TWL LRG LVL3 (GOWN DISPOSABLE) ×1 IMPLANT
GOWN STRL REUS W/TWL 2XL LVL3 (GOWN DISPOSABLE) ×2 IMPLANT
GOWN STRL REUS W/TWL LRG LVL3 (GOWN DISPOSABLE) ×4
NDL HYPO 25X1 1.5 SAFETY (NEEDLE) ×1 IMPLANT
NEEDLE HYPO 25X1 1.5 SAFETY (NEEDLE) ×2 IMPLANT
NS IRRIG 1000ML POUR BTL (IV SOLUTION) ×1 IMPLANT
PACK BASIN DAY SURGERY FS (CUSTOM PROCEDURE TRAY) ×2 IMPLANT
PACK UNIVERSAL I (CUSTOM PROCEDURE TRAY) IMPLANT
PENCIL SMOKE EVACUATOR (MISCELLANEOUS) ×2 IMPLANT
SLEEVE SCD COMPRESS KNEE MED (STOCKING) ×1 IMPLANT
SPONGE LAP 18X18 RF (DISPOSABLE) ×2 IMPLANT
STAPLER VISISTAT 35W (STAPLE) IMPLANT
STRIP CLOSURE SKIN 1/2X4 (GAUZE/BANDAGES/DRESSINGS) ×2 IMPLANT
SUT MNCRL AB 4-0 PS2 18 (SUTURE) ×2 IMPLANT
SUT SILK 2 0 SH (SUTURE) ×1 IMPLANT
SUT SILK 3 0 TIES 17X18 (SUTURE)
SUT SILK 3-0 18XBRD TIE BLK (SUTURE) IMPLANT
SUT VIC AB 2-0 SH 27 (SUTURE)
SUT VIC AB 2-0 SH 27XBRD (SUTURE) IMPLANT
SUT VIC AB 3-0 SH 27 (SUTURE) ×2
SUT VIC AB 3-0 SH 27X BRD (SUTURE) ×1 IMPLANT
SYR BULB EAR ULCER 3OZ GRN STR (SYRINGE) ×2 IMPLANT
SYR CONTROL 10ML LL (SYRINGE) ×2 IMPLANT
TOWEL GREEN STERILE FF (TOWEL DISPOSABLE) ×2 IMPLANT
TUBE CONNECTING 20X1/4 (TUBING) IMPLANT
YANKAUER SUCT BULB TIP NO VENT (SUCTIONS) IMPLANT

## 2021-04-08 NOTE — Anesthesia Postprocedure Evaluation (Signed)
Anesthesia Post Note  Patient: [name]  Procedure(s) Performed: EXCISION LEFT CHEST WALL MASS (Left Chest)     Patient location during evaluation: PACU Anesthesia Type: General Level of consciousness: sedated and patient cooperative Pain management: pain level controlled Vital Signs Assessment: post-procedure vital signs reviewed and stable Respiratory status: spontaneous breathing Cardiovascular status: stable Anesthetic complications: no   No complications documented.  Last Vitals:  Vitals:   04/08/21 1205 04/08/21 1215  BP:  134/81  Pulse: 79   Resp: (!) 25 16  Temp:  36.6 C  SpO2: 94% 95%    Last Pain:  Vitals:   04/08/21 1215  TempSrc:   PainSc: 0-No pain                 Nolon Nations

## 2021-04-08 NOTE — Discharge Instructions (Addendum)
[facility] Office Phone Number [phone]  BREAST BIOPSY/ PARTIAL MASTECTOMY: POST OP INSTRUCTIONS  Always review your discharge instruction sheet given to you by the facility where your surgery was performed.  IF YOU HAVE DISABILITY OR FAMILY LEAVE FORMS, YOU MUST BRING THEM TO THE OFFICE FOR PROCESSING.  DO NOT GIVE THEM TO YOUR DOCTOR.  1. A prescription for pain medication may be given to you upon discharge.  Take your pain medication as prescribed, if needed.  If narcotic pain medicine is not needed, then you may take acetaminophen (Tylenol) or ibuprofen (Advil) as needed. No Tylenol until 3:30pm if needed. 2. Take your usually prescribed medications unless otherwise directed 3. If you need a refill on your pain medication, please contact your pharmacy.  They will contact our office to request authorization.  Prescriptions will not be filled after 5pm or on week-ends. 4. You should eat very light the first 24 hours after surgery, such as soup, crackers, pudding, etc.  Resume your normal diet the day after surgery. 5. Most patients will experience some swelling and bruising in the breast.  Ice packs and a good support bra will help.  Swelling and bruising can take several days to resolve.  6. It is common to experience some constipation if taking pain medication after surgery.  Increasing fluid intake and taking a stool softener will usually help or prevent this problem from occurring.  A mild laxative (Milk of Magnesia or Miralax) should be taken according to package directions if there are no bowel movements after 48 hours. 7. Unless discharge instructions indicate otherwise, you may remove your bandages 48 hours after surgery, and you may shower at that time.  You may have steri-strips (small skin tapes) in place directly over the incision.  These strips should be left on the skin for 7-10 days.   Any sutures or staples will be removed at the office during your follow-up  visit. 8. ACTIVITIES:  You may resume regular daily activities (gradually increasing) beginning the next day.  Wearing a good support bra or sports bra (or the breast binder) minimizes pain and swelling.  You may have sexual intercourse when it is comfortable. a. You may drive when you no longer are taking prescription pain medication, you can comfortably wear a seatbelt, and you can safely maneuver your car and apply brakes. b. RETURN TO WORK:  __________1 week_______________ 9. You should see your doctor in the office for a follow-up appointment approximately two weeks after your surgery.  Your doctor's nurse will typically make your follow-up appointment when she calls you with your pathology report.  Expect your pathology report 2-3 business days after your surgery.  You may call to check if you do not hear from [name] after three days.   WHEN TO CALL YOUR DOCTOR: 1. Fever over 101.0 2. Nausea and/or vomiting. 3. Extreme swelling or bruising. 4. Continued bleeding from incision. 5. Increased pain, redness, or drainage from the incision.  The clinic staff is available to answer your questions during regular business hours.  Please don't hesitate to call and ask to speak to one of the nurses for clinical concerns.  If you have a medical emergency, go to the nearest emergency room or call 911.  A surgeon from [facility] is always on call at the hospital.  For further questions, please visit [email or web address]    Post Anesthesia Home Care Instructions  Activity: Get plenty of rest for the remainder of the day. A responsible individual  must stay with you for 24 hours following the procedure.  For the next 24 hours, DO NOT: -Drive a car -Paediatric nurse -Drink alcoholic beverages -Take any medication unless instructed by your physician -Make any legal decisions or sign important papers.  Meals: Start with liquid foods such as gelatin or soup. Progress to regular foods  as tolerated. Avoid greasy, spicy, heavy foods. If nausea and/or vomiting occur, drink only clear liquids until the nausea and/or vomiting subsides. Call your physician if vomiting continues.  Special Instructions/Symptoms: Your throat may feel dry or sore from the anesthesia or the breathing tube placed in your throat during surgery. If this causes discomfort, gargle with warm salt water. The discomfort should disappear within 24 hours.  If you had a scopolamine patch placed behind your ear for the management of post- operative nausea and/or vomiting:  1. The medication in the patch is effective for 72 hours, after which it should be removed.  Wrap patch in a tissue and discard in the trash. Wash hands thoroughly with soap and water. 2. You may remove the patch earlier than 72 hours if you experience unpleasant side effects which may include dry mouth, dizziness or visual disturbances. 3. Avoid touching the patch. Wash your hands with soap and water after contact with the patch.

## 2021-04-08 NOTE — Transfer of Care (Signed)
Immediate Anesthesia Transfer of Care Note  Patient: [name]  Procedure(s) Performed: EXCISION LEFT CHEST WALL MASS (Left Chest)  Patient Location: PACU  Anesthesia Type:General  Level of Consciousness: awake, alert , oriented, drowsy and patient cooperative  Airway & Oxygen Therapy: Patient Spontanous Breathing and Patient connected to face mask oxygen  Post-op Assessment: Report given to RN and Post -op Vital signs reviewed and stable  Post vital signs: Reviewed and stable  Last Vitals:  Vitals Value Taken Time  BP    Temp    Pulse 87 04/08/21 1139  Resp    SpO2 97 % 04/08/21 1139  Vitals shown include unvalidated device data.  Last Pain:  Vitals:   04/08/21 0853  TempSrc: Oral  PainSc: 0-No pain      Patients Stated Pain Goal: 4 (91/44/45 8483)  Complications: No complications documented.

## 2021-04-08 NOTE — Anesthesia Preprocedure Evaluation (Addendum)
Anesthesia Evaluation  Patient identified by MRN, date of birth, ID band Patient awake    Reviewed: Allergy & Precautions, NPO status , Patient's Chart, lab work & pertinent test results  Airway Mallampati: II  TM Distance: >3 FB Neck ROM: Full    Dental  (+) Dental Advisory Given, Chipped,    Pulmonary Current Smoker and Patient abstained from smoking.,    Pulmonary exam normal breath sounds clear to auscultation       Cardiovascular hypertension, Pt. on medications and Pt. on home beta blockers + CAD, + Past MI, + Cardiac Stents and +CHF   Rhythm:Regular Rate:Normal  Echo 09/2019 1. Left ventricular ejection fraction, by visual estimation, is 60 to 65%. The left ventricle has normal function. Normal left ventricular size. There is mildly increased left ventricular hypertrophy.  2. Left ventricular diastolic Doppler parameters are consistent with impaired relaxation pattern of LV diastolic filling.  3. Global right ventricle has normal systolic function.The right ventricular size is normal. No increase in right ventricular wall thickness.  4. Left atrial size was normal.  5. Right atrial size was normal.  6. The mitral valve is normal in structure. Trace mitral valve regurgitation. No evidence of mitral stenosis.  7. The tricuspid valve is grossly normal. Tricuspid valve regurgitation is trivial.  8. Mildly thickened tricuspid valve leaflets.  9. The aortic valve is normal in structure. Aortic valve regurgitation was not visualized by color flow Doppler. Structurally normal aortic valve, with no evidence of sclerosis or stenosis.  10. The pulmonic valve was normal in structure. Pulmonic valve regurgitation is not visualized by color flow Doppler.  11. Mildly elevated pulmonary artery systolic pressure.  12. The inferior vena cava is normal in size with greater than 50% respiratory variability, suggesting right atrial pressure  of 3 mmHg.    Neuro/Psych PSYCHIATRIC DISORDERS Anxiety negative neurological ROS     GI/Hepatic negative GI ROS, Neg liver ROS,   Endo/Other  Morbid obesity  Renal/GU Renal disease     Musculoskeletal  (+) Arthritis ,   Abdominal (+) + obese,   Peds  Hematology  (+) Blood dyscrasia, anemia ,   Anesthesia Other Findings   Reproductive/Obstetrics negative OB ROS                           Anesthesia Physical Anesthesia Plan  ASA: III  Anesthesia Plan: General   Post-op Pain Management:    Induction: Intravenous  PONV Risk Score and Plan: 2 and Ondansetron, Dexamethasone, Treatment may vary due to age or medical condition and Midazolam  Airway Management Planned: LMA  Additional Equipment: None  Intra-op Plan:   Post-operative Plan: Extubation in OR  Informed Consent: I have reviewed the patients History and Physical, chart, labs and discussed the procedure including the risks, benefits and alternatives for the proposed anesthesia with the patient or authorized representative who has indicated his/her understanding and acceptance.     Dental advisory given  Plan Discussed with: CRNA  Anesthesia Plan Comments:        Anesthesia Quick Evaluation

## 2021-04-08 NOTE — Op Note (Signed)
PRE-OPERATIVE DIAGNOSIS: left chest wall mass, s/p mastectomy for left breast cancer  POST-OPERATIVE DIAGNOSIS:  Same  PROCEDURE:  Procedure(s): Excision of left chest wall mass, subcutaneous, 2-3 cm  SURGEON:  Surgeon(s): Stark Klein, MD  ASSIST: Carlena Hurl, PA-C  ANESTHESIA:   local and general  DRAINS: none   LOCAL MEDICATIONS USED:  BUPIVICAINE  and LIDOCAINE   SPECIMEN:  Source of Specimen:  left chest wall mass  DISPOSITION OF SPECIMEN:  PATHOLOGY  COUNTS:  YES  DICTATION: .Dragon Dictation  PLAN OF CARE: Discharge to home after PACU  PATIENT DISPOSITION:  PACU - hemodynamically stable.  FINDINGS:  Firm mass adherent to skin and muscle.    EBL: min  PROCEDURE:  Pt was identified int he holding area and was taken to the OR where she was placed supine on the OR table.  General anesthesia was induced.  Her left chest was prepped and draped in sterile fashion.  A time out was performed according to the surgical safety checklist.  When all was correct, we continued.    Local anesthetic was infiltrated near the mass and prior mastectomy incision.  The skin was tethered to the mass.  An ellipse of skin was taken out with the mass.  The incision was made with a #10 blade.  The cautery was used to dissect the mass from the surrounding tissue.  It was adherent to to the muscle as well.  The cavity was examined and hemostasis was achieved with cautery.  The wound was irrigated.    The wound was then closed with 3-0 vicryl interrupted deep dermal sutures and 4-0 monocryl running subcuticular suture.  The wound was then dressed with sterile dressings.  The patient was allowed to emerge from anesthesia and was taken to the PACU in stable condition.  Needle, sponge, and instrument counts were correct.

## 2021-04-08 NOTE — Anesthesia Procedure Notes (Signed)
Procedure Name: LMA Insertion Date/Time: 04/08/2021 10:50 AM Performed by: Willa Frater, CRNA Pre-anesthesia Checklist: Patient identified, Emergency Drugs available, Suction available and Patient being monitored Patient Re-evaluated:Patient Re-evaluated prior to induction Oxygen Delivery Method: Circle system utilized Preoxygenation: Pre-oxygenation with 100% oxygen Induction Type: IV induction Ventilation: Mask ventilation without difficulty LMA: LMA inserted LMA Size: 4.0 Number of attempts: 1 Airway Equipment and Method: Bite block Placement Confirmation: positive ETCO2 Tube secured with: Tape Dental Injury: Teeth and Oropharynx as per pre-operative assessment

## 2021-04-08 NOTE — Interval H&P Note (Signed)
History and Physical Interval Note:  04/08/2021 9:23 AM  [name]  has presented today for surgery, with the diagnosis of LEFT CHEST WALL MASS.  The various methods of treatment have been discussed with the patient and family. After consideration of risks, benefits and other options for treatment, the patient has consented to  Procedure(s) with comments: EXCISION LEFT CHEST WALL MASS (N/A) - 60/RM8 as a surgical intervention.  The patient's history has been reviewed, patient examined, no change in status, stable for surgery.  I have reviewed the patient's chart and labs.  Questions were answered to the patient's satisfaction.     Stark Klein

## 2021-04-09 ENCOUNTER — Encounter (HOSPITAL_BASED_OUTPATIENT_CLINIC_OR_DEPARTMENT_OTHER): Payer: Self-pay | Admitting: General Surgery

## 2021-04-09 DIAGNOSIS — S8262XA Displaced fracture of lateral malleolus of left fibula, initial encounter for closed fracture: Secondary | ICD-10-CM | POA: Diagnosis not present

## 2021-04-10 DIAGNOSIS — S8262XA Displaced fracture of lateral malleolus of left fibula, initial encounter for closed fracture: Secondary | ICD-10-CM | POA: Diagnosis not present

## 2021-04-10 LAB — SURGICAL PATHOLOGY

## 2021-04-11 DIAGNOSIS — S8262XA Displaced fracture of lateral malleolus of left fibula, initial encounter for closed fracture: Secondary | ICD-10-CM | POA: Diagnosis not present

## 2021-04-12 DIAGNOSIS — S8262XA Displaced fracture of lateral malleolus of left fibula, initial encounter for closed fracture: Secondary | ICD-10-CM | POA: Diagnosis not present

## 2021-04-13 ENCOUNTER — Ambulatory Visit: Payer: Medicaid Other | Admitting: Family Medicine

## 2021-04-13 DIAGNOSIS — S8262XA Displaced fracture of lateral malleolus of left fibula, initial encounter for closed fracture: Secondary | ICD-10-CM | POA: Diagnosis not present

## 2021-04-14 DIAGNOSIS — S8262XA Displaced fracture of lateral malleolus of left fibula, initial encounter for closed fracture: Secondary | ICD-10-CM | POA: Diagnosis not present

## 2021-04-15 ENCOUNTER — Other Ambulatory Visit: Payer: Medicaid Other

## 2021-04-15 DIAGNOSIS — S8262XA Displaced fracture of lateral malleolus of left fibula, initial encounter for closed fracture: Secondary | ICD-10-CM | POA: Diagnosis not present

## 2021-04-16 ENCOUNTER — Telehealth: Payer: Self-pay | Admitting: Family Medicine

## 2021-04-16 DIAGNOSIS — S8262XA Displaced fracture of lateral malleolus of left fibula, initial encounter for closed fracture: Secondary | ICD-10-CM | POA: Diagnosis not present

## 2021-04-16 NOTE — Telephone Encounter (Signed)
I have made two attempts to reach [name] but when I call her number it rings multiple times and then it sounds like someone answers and then they DC the call. I was trying to get her rescheduled for a phone visit with the [facility] RNCM. Unable to leave a message.

## 2021-04-17 DIAGNOSIS — S8262XA Displaced fracture of lateral malleolus of left fibula, initial encounter for closed fracture: Secondary | ICD-10-CM | POA: Diagnosis not present

## 2021-04-18 DIAGNOSIS — S8262XA Displaced fracture of lateral malleolus of left fibula, initial encounter for closed fracture: Secondary | ICD-10-CM | POA: Diagnosis not present

## 2021-04-19 DIAGNOSIS — S8262XA Displaced fracture of lateral malleolus of left fibula, initial encounter for closed fracture: Secondary | ICD-10-CM | POA: Diagnosis not present

## 2021-04-20 ENCOUNTER — Other Ambulatory Visit: Payer: Self-pay | Admitting: *Deleted

## 2021-04-20 DIAGNOSIS — G8929 Other chronic pain: Secondary | ICD-10-CM

## 2021-04-20 DIAGNOSIS — Z853 Personal history of malignant neoplasm of breast: Secondary | ICD-10-CM

## 2021-04-20 DIAGNOSIS — I10 Essential (primary) hypertension: Secondary | ICD-10-CM

## 2021-04-20 DIAGNOSIS — S8262XA Displaced fracture of lateral malleolus of left fibula, initial encounter for closed fracture: Secondary | ICD-10-CM | POA: Diagnosis not present

## 2021-04-20 DIAGNOSIS — M792 Neuralgia and neuritis, unspecified: Secondary | ICD-10-CM

## 2021-04-21 ENCOUNTER — Ambulatory Visit: Payer: Medicaid Other | Admitting: Rehabilitation

## 2021-04-21 DIAGNOSIS — S8262XA Displaced fracture of lateral malleolus of left fibula, initial encounter for closed fracture: Secondary | ICD-10-CM | POA: Diagnosis not present

## 2021-04-21 MED ORDER — ISOSORBIDE MONONITRATE ER 60 MG PO TB24: 60.0000 mg | ORAL_TABLET | Freq: Every day | ORAL | 3 refills | Status: DC

## 2021-04-21 MED ORDER — ATORVASTATIN CALCIUM 40 MG PO TABS: 40.0000 mg | ORAL_TABLET | Freq: Every day | ORAL | 3 refills | Status: DC

## 2021-04-22 ENCOUNTER — Ambulatory Visit: Payer: Medicaid Other | Admitting: Family Medicine

## 2021-04-22 DIAGNOSIS — S8262XA Displaced fracture of lateral malleolus of left fibula, initial encounter for closed fracture: Secondary | ICD-10-CM | POA: Diagnosis not present

## 2021-04-22 NOTE — Progress Notes (Deleted)
     SUBJECTIVE:   CHIEF COMPLAINT / HPI:   [name] is a 60 y.o. female presents for neck mass follow up  Neck mass follow up  Pt with *** history of left sided neck mass. Recent CT soft tissue neck:  No soft tissue neck mass or cervical lymphadenopathy identified.  Breast mass New subcutaneous tissue within the medial aspect of the LEFT chest wall superficial to the pectoralis muscle. Tissue does not have a particularly aggressive appearance however recommend diagnostic breast ultrasound for further evaluation. New nodule in the LEFT lower lobe appears benign. Recommend follow-up CT in 3 months. Breast US pending.   [facility] Office Visit from 12/31/2020 in [facility]  PHQ-9 Total Score 0       Health Maintenance Due  Topic  . COVID-19 Vaccine (1)  . COLONOSCOPY (Pts 45-37yrs Insurance coverage will need to be confirmed)   . URINE MICROALBUMIN   . PAP SMEAR-Modifier       PERTINENT  PMH / PSH:   OBJECTIVE:   LMP 08/07/2011    General: Alert, no acute distress Cardio: Normal S1 and S2, RRR, no r/m/g Pulm: CTAB, normal work of breathing Abdomen: Bowel sounds normal. Abdomen soft and non-tender.  Extremities: No peripheral edema.  Neuro: Cranial nerves grossly intact   ASSESSMENT/PLAN:   No problem-specific Assessment & Plan notes found for this encounter.     [name], MD PGY-2 [place]

## 2021-04-23 ENCOUNTER — Other Ambulatory Visit: Payer: Self-pay

## 2021-04-23 DIAGNOSIS — S8262XA Displaced fracture of lateral malleolus of left fibula, initial encounter for closed fracture: Secondary | ICD-10-CM | POA: Diagnosis not present

## 2021-04-23 MED ORDER — AMLODIPINE BESYLATE 5 MG PO TABS: 5.0000 mg | ORAL_TABLET | Freq: Every day | ORAL | 3 refills | Status: DC

## 2021-04-24 ENCOUNTER — Other Ambulatory Visit: Payer: Self-pay

## 2021-04-24 DIAGNOSIS — S8262XA Displaced fracture of lateral malleolus of left fibula, initial encounter for closed fracture: Secondary | ICD-10-CM | POA: Diagnosis not present

## 2021-04-24 NOTE — Patient Instructions (Signed)
Visit Information  [name] was given information about Medicaid Managed Care team care coordination services as a part of their [place] Medicaid benefit. [name] verbally consented to engagement with the [facility] Managed Care team.   For questions related to your [facility], please call: [phone] or visit the homepage here: [email or web address]  If you would like to schedule transportation through your [facility], please call the following number at least 2 days in advance of your appointment: [phone].   Call the [place] at [phone], at any time, 24 hours a day, 7 days a week. If you are in danger or need immediate medical attention call 911.  [name] - following are the goals we discussed in your visit today:  Goals Addressed   None      The patient has been provided with contact information for the Managed Medicaid care management team and has been advised to call with any health related questions or concerns.   Mickel Fuchs, BSW, [place] Managed Medicaid Team  [phone]  Following is a copy of your plan of care:

## 2021-04-24 NOTE — Patient Outreach (Signed)
Medicaid Managed Care Social Work Note  04/24/2021 Name:  CHALESE [name] MRN:  [id number] DOB:  07-Nov-1961  [name] is an 60 y.o. year old female who is a primary patient of [name], MD.  The Medicaid Managed Care Coordination team was consulted for assistance with:  Community Resources   [name] was given information about Medicaid Managed CareCoordination services today. [name] agreed to services and verbal consent obtained.  Engaged with patient  for by telephone forfollow up visit in response to referral for case management and/or care coordination services.   Assessments/Interventions:  Review of past medical history, allergies, medications, health status, including review of consultants reports, laboratory and other test data, was performed as part of comprehensive evaluation and provision of chronic care management services.  SDOH: (Social Determinant of Health) assessments and interventions performed:  BSW contacted Patient to follow up on resources provided for dental and eye glasses. Patient stated she has not contacted them yet and plans to do so today or Monday. No other resources needed at this time.   Advanced Directives Status:  Not addressed in this encounter.  Care Plan                 No Known Allergies  Medications Reviewed Today    Reviewed by Willa Frater, CRNA (Certified Registered Nurse Anesthetist) on 04/08/21 at [place] List Status: <None>  Medication Order Taking? Sig Documenting Provider Last Dose Status Informant  acetaminophen (TYLENOL) 500 MG tablet [id number] Yes Take 2 tablets (1,000 mg total) by mouth every 8 (eight) hours as needed. Savannah Dolly, MD Past Week Unknown time Active Self  allopurinol (ZYLOPRIM) 300 MG tablet [id number] Yes TAKE 1 TABLET BY MOUTH EVERY DAY [name], MD 04/07/2021 Unknown time Active   amLODipine (NORVASC) 5 MG tablet [id number] Yes Take 1 tablet (5 mg total) by mouth daily. Savannah Alu, MD 04/07/2021 Unknown time Active Self  ascorbic acid (VITAMIN C) 500 MG tablet [id number] Yes Take 500 mg by mouth daily at 10 pm. [provider] Past Week Unknown time Active Self  aspirin EC 81 MG tablet [id number] Yes Take 81 mg by mouth daily at 10 pm.  [provider] 03/31/2021 Active Self  atorvastatin (LIPITOR) 40 MG tablet [id number] Yes Take 1 tablet (40 mg total) by mouth daily at 6 PM. Savannah Alu, MD 04/07/2021 Unknown time Active Self  camphor-menthol [facility]) lotion [id number] Yes Apply topically as needed for itching. Savannah Filler, MD Past Week Unknown time Active Self  ELDERBERRY PO [id number] Yes Take 1,000 mg by mouth daily at 10 pm. [provider] Past Week Unknown time Active Self  Ginkgo Biloba 120 MG TABS [id number] Yes Take 120 mg by mouth daily at 10 pm. [provider] Past Week Unknown time Active Self  isosorbide mononitrate (IMDUR) 60 MG 24 hr tablet [id number] Yes TAKE 1 TABLET (60 MG TOTAL) BY MOUTH AT BEDTIME. Savannah Alu, MD 04/07/2021 Unknown time Active Self  KLOR-CON M20 20 MEQ tablet [id number] Yes TAKE 1 TABLET (20 MEQ TOTAL) BY MOUTH 3 (THREE) TIMES DAILY. [name], MD Past Week Unknown time Active   methadone (DOLOPHINE) 5 MG tablet [id number] Yes TAKE 3 TABLETS BY MOUTH EVERY 8 HOURS FOR PAIN Savannah, Charlestine Massed, NP Past Week Unknown time Active   methadone (DOLOPHINE) 5 MG tablet [id number] Yes TAKE 3 TABLETS BY MOUTH EVERY 8 HOURS AS NEEDED FOR PAIN Savannah, Savannah Dad, MD Past  Week Unknown time Active   metoprolol succinate (TOPROL-XL) 50 MG 24 hr tablet [id number] Yes Take 2 tablets (100 mg total) by mouth daily. Take with or immediately following a meal. [name], MD 04/07/2021 Unknown time Active   nitroGLYCERIN (NITROSTAT) 0.4 MG SL tablet [id number] Yes Place 1 tablet (0.4 mg total) under the tongue every 5 (five) minutes as needed for chest pain. Savannah Dopp T, PA-C 03/30/2021 Unknown time  Active Self  OVER THE COUNTER MEDICATION [id number] Yes Zinc 30 mg daily as needed [provider] Past Week Unknown time Active   OVER THE COUNTER MEDICATION [id number] Yes Ear drying drops - as needed [provider] Past Week Unknown time Active   tamoxifen (NOLVADEX) 20 MG tablet [id number] Yes Take 1 tablet (20 mg total) by mouth daily. Savannah, Savannah Dad, MD 04/07/2021 Unknown time Active           Patient Active Problem List   Diagnosis Date Noted  . Prediabetes 01/01/2021  . Concern about ear disease without diagnosis 01/01/2021  . Neck mass 11/05/2020  . Arthritis of right knee 06/11/2020  . Acquired absence of both breasts 04/01/2020  . Left facial swelling 02/22/2020  . Left arm cellulitis 09/28/2019  . Cellulitis 09/28/2019  . Cellulitis of left arm   . Bilateral leg edema 06/21/2019  . Dysfunctional uterine bleeding 04/30/2019  . Right leg pain 09/13/2018  . Acquired trigger finger of right ring finger 05/29/2018  . Hyperlipidemia 02/08/2018  . History of ST elevation myocardial infarction (STEMI) 12/01/2017  . Endometrial hyperplasia, simple 11/08/2017  . Unilateral primary osteoarthritis, right knee 10/27/2017  . Abdominal mass 08/15/2017  . Hypertensive disorder 04/17/2016  . Dyslipidemia 04/17/2016  . CAD (coronary artery disease) 04/17/2016  . Thrombocytosis 03/10/2015  . Tobacco abuse 12/02/2014  . Cancer of overlapping sites of right female breast ([place]) 09/16/2014  . Breast cancer of upper-outer quadrant of left female breast ([place]) 09/16/2014  . Hot flashes due to tamoxifen 05/09/2014  . Lymphedema of arm 11/08/2013  . Chronic pain 03/31/2012  . Hypokalemia 03/31/2012  . Atherosclerosis of aorta (HCC)     Conditions to be addressed/monitored per PCP order:  dental and eye glasses  There are no care plans that you recently modified to display for this patient.   Follow up:  Patient agrees to Care Plan and Follow-up.  Plan: The patient  has been provided with contact information for the Managed Medicaid care management team and has been advised to call with any health related questions or concerns.    [name], BSW, [place]  High Risk Managed Medicaid Team

## 2021-04-25 DIAGNOSIS — S8262XA Displaced fracture of lateral malleolus of left fibula, initial encounter for closed fracture: Secondary | ICD-10-CM | POA: Diagnosis not present

## 2021-04-26 DIAGNOSIS — S8262XA Displaced fracture of lateral malleolus of left fibula, initial encounter for closed fracture: Secondary | ICD-10-CM | POA: Diagnosis not present

## 2021-04-27 DIAGNOSIS — S8262XA Displaced fracture of lateral malleolus of left fibula, initial encounter for closed fracture: Secondary | ICD-10-CM | POA: Diagnosis not present

## 2021-04-28 ENCOUNTER — Ambulatory Visit: Payer: Medicaid Other | Admitting: Family Medicine

## 2021-04-28 DIAGNOSIS — S8262XA Displaced fracture of lateral malleolus of left fibula, initial encounter for closed fracture: Secondary | ICD-10-CM | POA: Diagnosis not present

## 2021-04-29 ENCOUNTER — Ambulatory Visit: Payer: Medicaid Other | Admitting: Podiatry

## 2021-04-29 DIAGNOSIS — S8262XA Displaced fracture of lateral malleolus of left fibula, initial encounter for closed fracture: Secondary | ICD-10-CM | POA: Diagnosis not present

## 2021-04-30 DIAGNOSIS — S8262XA Displaced fracture of lateral malleolus of left fibula, initial encounter for closed fracture: Secondary | ICD-10-CM | POA: Diagnosis not present

## 2021-05-01 DIAGNOSIS — S8262XA Displaced fracture of lateral malleolus of left fibula, initial encounter for closed fracture: Secondary | ICD-10-CM | POA: Diagnosis not present

## 2021-05-02 DIAGNOSIS — S8262XA Displaced fracture of lateral malleolus of left fibula, initial encounter for closed fracture: Secondary | ICD-10-CM | POA: Diagnosis not present

## 2021-05-03 DIAGNOSIS — S8262XA Displaced fracture of lateral malleolus of left fibula, initial encounter for closed fracture: Secondary | ICD-10-CM | POA: Diagnosis not present
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
## 2021-05-04 ENCOUNTER — Other Ambulatory Visit: Payer: Medicaid Other

## 2021-05-04 ENCOUNTER — Ambulatory Visit: Payer: Medicaid Other | Admitting: Oncology

## 2021-05-04 DIAGNOSIS — S8262XA Displaced fracture of lateral malleolus of left fibula, initial encounter for closed fracture: Secondary | ICD-10-CM | POA: Diagnosis not present

## 2021-05-05 ENCOUNTER — Telehealth: Payer: Self-pay | Admitting: Oncology

## 2021-05-05 ENCOUNTER — Other Ambulatory Visit (HOSPITAL_COMMUNITY): Payer: Self-pay

## 2021-05-05 ENCOUNTER — Encounter: Payer: Medicaid Other | Admitting: Rehabilitation

## 2021-05-05 ENCOUNTER — Other Ambulatory Visit: Payer: Self-pay | Admitting: Oncology

## 2021-05-05 DIAGNOSIS — C50412 Malignant neoplasm of upper-outer quadrant of left female breast: Secondary | ICD-10-CM

## 2021-05-05 DIAGNOSIS — S8262XA Displaced fracture of lateral malleolus of left fibula, initial encounter for closed fracture: Secondary | ICD-10-CM | POA: Diagnosis not present

## 2021-05-05 MED ORDER — METHADONE HCL 5 MG PO TABS
ORAL_TABLET | ORAL | 0 refills | Status: DC
  Filled 2021-05-05: qty 270, 30d supply, fill #0

## 2021-05-05 NOTE — Telephone Encounter (Signed)
Scheduled appointment per 05/16 los. Patient is aware.  

## 2021-05-06 ENCOUNTER — Ambulatory Visit: Payer: Medicaid Other | Admitting: Family Medicine

## 2021-05-06 ENCOUNTER — Ambulatory Visit: Payer: Medicaid Other | Admitting: Podiatry

## 2021-05-06 DIAGNOSIS — S8262XA Displaced fracture of lateral malleolus of left fibula, initial encounter for closed fracture: Secondary | ICD-10-CM | POA: Diagnosis not present

## 2021-05-06 NOTE — Progress Notes (Deleted)
    SUBJECTIVE:   CHIEF COMPLAINT / HPI:   Neck mass follow up   Prediabetes    PERTINENT  PMH / PSH: ***  OBJECTIVE:   LMP 08/07/2011   ***  ASSESSMENT/PLAN:   No problem-specific Assessment & Plan notes found for this encounter.     Wilber Oliphant, MD [place]   {    This will disappear when note is signed, click to select method of visit    :1}

## 2021-05-07 ENCOUNTER — Other Ambulatory Visit: Payer: Self-pay

## 2021-05-07 DIAGNOSIS — S8262XA Displaced fracture of lateral malleolus of left fibula, initial encounter for closed fracture: Secondary | ICD-10-CM | POA: Diagnosis not present

## 2021-05-08 DIAGNOSIS — S8262XA Displaced fracture of lateral malleolus of left fibula, initial encounter for closed fracture: Secondary | ICD-10-CM | POA: Diagnosis not present

## 2021-05-09 DIAGNOSIS — S8262XA Displaced fracture of lateral malleolus of left fibula, initial encounter for closed fracture: Secondary | ICD-10-CM | POA: Diagnosis not present

## 2021-05-10 DIAGNOSIS — S8262XA Displaced fracture of lateral malleolus of left fibula, initial encounter for closed fracture: Secondary | ICD-10-CM | POA: Diagnosis not present

## 2021-05-11 ENCOUNTER — Ambulatory Visit: Payer: Medicaid Other | Admitting: Obstetrics & Gynecology

## 2021-05-11 DIAGNOSIS — S8262XA Displaced fracture of lateral malleolus of left fibula, initial encounter for closed fracture: Secondary | ICD-10-CM | POA: Diagnosis not present

## 2021-05-12 ENCOUNTER — Encounter: Payer: Medicaid Other | Admitting: Physical Therapy

## 2021-05-12 ENCOUNTER — Inpatient Hospital Stay: Admission: RE | Admit: 2021-05-12 | Payer: Medicaid Other | Source: Ambulatory Visit

## 2021-05-12 DIAGNOSIS — S8262XA Displaced fracture of lateral malleolus of left fibula, initial encounter for closed fracture: Secondary | ICD-10-CM | POA: Diagnosis not present

## 2021-05-13 ENCOUNTER — Ambulatory Visit (INDEPENDENT_AMBULATORY_CARE_PROVIDER_SITE_OTHER): Payer: Medicaid Other

## 2021-05-13 ENCOUNTER — Ambulatory Visit (INDEPENDENT_AMBULATORY_CARE_PROVIDER_SITE_OTHER): Payer: Medicaid Other | Admitting: Podiatry

## 2021-05-13 ENCOUNTER — Other Ambulatory Visit: Payer: Self-pay

## 2021-05-13 DIAGNOSIS — S82892G Other fracture of left lower leg, subsequent encounter for closed fracture with delayed healing: Secondary | ICD-10-CM

## 2021-05-13 DIAGNOSIS — Z9889 Other specified postprocedural states: Secondary | ICD-10-CM

## 2021-05-13 DIAGNOSIS — S8262XA Displaced fracture of lateral malleolus of left fibula, initial encounter for closed fracture: Secondary | ICD-10-CM | POA: Diagnosis not present
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
## 2021-05-14 ENCOUNTER — Other Ambulatory Visit: Payer: Self-pay

## 2021-05-14 DIAGNOSIS — S8262XA Displaced fracture of lateral malleolus of left fibula, initial encounter for closed fracture: Secondary | ICD-10-CM | POA: Diagnosis not present

## 2021-05-15 DIAGNOSIS — S8262XA Displaced fracture of lateral malleolus of left fibula, initial encounter for closed fracture: Secondary | ICD-10-CM | POA: Diagnosis not present

## 2021-05-16 DIAGNOSIS — S8262XA Displaced fracture of lateral malleolus of left fibula, initial encounter for closed fracture: Secondary | ICD-10-CM | POA: Diagnosis not present

## 2021-05-17 DIAGNOSIS — S8262XA Displaced fracture of lateral malleolus of left fibula, initial encounter for closed fracture: Secondary | ICD-10-CM | POA: Diagnosis not present

## 2021-05-18 DIAGNOSIS — S8262XA Displaced fracture of lateral malleolus of left fibula, initial encounter for closed fracture: Secondary | ICD-10-CM | POA: Diagnosis not present

## 2021-05-19 DIAGNOSIS — S8262XA Displaced fracture of lateral malleolus of left fibula, initial encounter for closed fracture: Secondary | ICD-10-CM | POA: Diagnosis not present

## 2021-05-20 DIAGNOSIS — S8262XA Displaced fracture of lateral malleolus of left fibula, initial encounter for closed fracture: Secondary | ICD-10-CM | POA: Diagnosis not present

## 2021-05-21 DIAGNOSIS — S8262XA Displaced fracture of lateral malleolus of left fibula, initial encounter for closed fracture: Secondary | ICD-10-CM | POA: Diagnosis not present

## 2021-05-22 DIAGNOSIS — S8262XA Displaced fracture of lateral malleolus of left fibula, initial encounter for closed fracture: Secondary | ICD-10-CM | POA: Diagnosis not present

## 2021-05-23 DIAGNOSIS — S8262XA Displaced fracture of lateral malleolus of left fibula, initial encounter for closed fracture: Secondary | ICD-10-CM | POA: Diagnosis not present

## 2021-05-24 DIAGNOSIS — S8262XA Displaced fracture of lateral malleolus of left fibula, initial encounter for closed fracture: Secondary | ICD-10-CM | POA: Diagnosis not present

## 2021-05-25 ENCOUNTER — Ambulatory Visit: Payer: Medicaid Other | Admitting: Obstetrics & Gynecology

## 2021-05-25 DIAGNOSIS — S8262XA Displaced fracture of lateral malleolus of left fibula, initial encounter for closed fracture: Secondary | ICD-10-CM | POA: Diagnosis not present

## 2021-05-26 ENCOUNTER — Encounter: Payer: Medicaid Other | Admitting: Physical Therapy

## 2021-05-26 DIAGNOSIS — S8262XA Displaced fracture of lateral malleolus of left fibula, initial encounter for closed fracture: Secondary | ICD-10-CM | POA: Diagnosis not present

## 2021-05-27 ENCOUNTER — Other Ambulatory Visit: Payer: Self-pay

## 2021-05-27 DIAGNOSIS — S8262XA Displaced fracture of lateral malleolus of left fibula, initial encounter for closed fracture: Secondary | ICD-10-CM | POA: Diagnosis not present

## 2021-05-27 NOTE — Patient Instructions (Signed)
Visit Information  Ms. Magee was given information about Medicaid Managed Care team care coordination services as a part of their [place] Medicaid benefit. [name] verbally consented to engagement with the [facility] Managed Care team.   For questions related to your [facility], please call: [phone] or visit the homepage here: [email or web address]  If you would like to schedule transportation through your [facility], please call the following number at least 2 days in advance of your appointment: [phone].   Call the [place] at [phone], at any time, 24 hours a day, 7 days a week. If you are in danger or need immediate medical attention call 911.  Ms. Crownover - following are the goals we discussed in your visit today:  Goals Addressed   None     Please see education materials related to DM provided as print materials.   Patient verbalizes understanding of instructions provided today.   The Managed Medicaid care management team will reach out to the patient again over the next 90 days.   [place] Constable, Pharm.D., Managed Medicaid Pharmacist [phone]   Following is a copy of your plan of care:  Patient Care Plan: General Plan of Care (Adult)    Problem Identified: Health Promotion or Disease Self-Management (General Plan of Care)   Priority: High  Onset Date: 12/30/2020    Problem Identified: Harm or Injury     Long-Range Goal: Harm or Injury Prevented   Start Date: 12/30/2020  Expected End Date: 03/30/2021  This Visit's Progress: On track  Priority: High  Note:   Current Barriers:  . Patient with recent fall and fracture requiring surgery.  Nurse Case Manager Clinical Goal(s):  Marland Kitchen Over the next 30 days, patient will verbalize understanding of plan for surgery . Over the next 30 days, patient will work with  provider to address needs related to fracture. . Over the next 30 days, patient will attend all scheduled medical appointments:  Interventions:  . Inter-disciplinary care team collaboration (see longitudinal plan of care) . Evaluation of current treatment plan related to fracture and patient's adherence to plan as established by provider. . Reviewed medications with patient. . Discussed plans with patient for ongoing care management follow up and provided patient with direct contact information for care management team . Pharmacy referral for medication review.  Patient Goals/Self-Care Activities Over the next 30 days, patient will:  -Attends all scheduled provider appointments Calls provider office for new concerns or questions  Follow Up Plan: The Managed Medicaid care management team will reach out to the patient again over the next 30 days.  The patient has been provided with contact information for the Managed Medicaid care management team and has been advised to call with any health related questions or concerns.       Task: Identify and Reduce Risks for Harm or Injury   Note:   Care Management Activities:    - modification of home and work environment promoted    Notes:    Patient Care Plan: Medication Management    Problem Identified: Health Promotion or Disease Self-Management (General Plan of Care)     Long-Range Goal: Medication Management   This Visit's Progress: On track  Priority: High  Note:   Current Barriers:  . Patient doesn't know what meds are for and specifics or generalities about disease states . Patient broke foot recently and is unable to ambulate. This is causing a  lot of stress  Pharmacist Clinical Goal(s):  Marland Kitchen Over the next 90 days, patient will contact provider office for questions/concerns as evidenced notation of same in electronic health record through collaboration with PharmD and provider.  .   Interventions: . Inter-disciplinary care team  collaboration (see longitudinal plan of care) . Comprehensive medication review performed; medication list updated in electronic medical record  @RXCPDIABETES @ @RXCPHYPERTENSION @ @RXCPHYPERLIPIDEMIA @  Patient Goals/Self-Care Activities . Over the next 90 days, patient will:  - take medications as prescribed  Follow Up Plan: The care management team will reach out to the patient again over the next 90 days.     Task: Mutually Develop and Royce Macadamia Achievement of Patient Goals   Note:   Care Management Activities:    - verbalization of feelings encouraged    Notes:

## 2021-05-27 NOTE — Patient Outreach (Signed)
Medicaid Managed Care    Pharmacy Note  05/27/2021 Name: [name] MRN: [id number] DOB: 1961-12-07  [name] is a 60 y.o. year old female who is a primary care patient of [name], MD. The [facility] Managed Care Coordination team was consulted for assistance with disease management and care coordination needs.    Engaged with patient Engaged with patient by telephone for initial visit in response to referral for case management and/or care coordination services.  [name] was given information about Managed Medicaid Care Coordination team services today. [name] agreed to services and verbal consent obtained.   Objective:  Lab Results  Component Value Date   CREATININE 0.82 02/09/2021   CREATININE 0.90 10/09/2020   CREATININE 0.83 09/11/2020    Lab Results  Component Value Date   HGBA1C 6.0 (A) 12/31/2020       Component Value Date/Time   CHOL 141 12/31/2020 1646   TRIG 134 12/31/2020 1646   HDL 50 12/31/2020 1646   CHOLHDL 2.8 12/31/2020 1646   CHOLHDL 3.9 11/18/2015 0138   VLDL 50 (H) 11/18/2015 0138   LDLCALC 68 12/31/2020 1646    Other: (TSH, CBC, Vit D, etc.)  Clinical ASCVD: Yes  The 10-year ASCVD risk score [name]., et al., 2013) is: 22.2%   Values used to calculate the score:     Age: 34 years     Sex: Female     Is Non-Hispanic African American: Yes     Diabetic: Yes     Tobacco smoker: Yes     Systolic Blood Pressure: 976 mmHg     Is BP treated: Yes     HDL Cholesterol: 50 mg/dL     Total Cholesterol: 141 mg/dL    Other: (CHADS2VASc if Afib, PHQ9 if depression, MMRC or CAT for COPD, ACT, DEXA)  BP Readings from Last 3 Encounters:  04/08/21 134/81  02/09/21 127/72  12/31/20 128/84    Assessment/Interventions: Review of patient past medical history, allergies, medications, health status, including review of consultants reports, laboratory and other test data, was performed as part of comprehensive evaluation and  provision of chronic care management services.    Pain 01/01/21: With Meds: 3/10 and without: 7/10 -Methadone (PRN) Plan: At goal,  patient stable/ symptoms controlled    Cardio BP Readings from Last 3 Encounters:  04/08/21 134/81  02/09/21 127/72  12/31/20 128/84    Heart Attack in 2008 -Metoprolol Succ 50mg  -Amlodipine 5mg  -ISMN 60mg  HS Plan: At goal,  patient stable/ symptoms controlled.   Pre-DM Lab Results  Component Value Date   HGBA1C 6.0 (A) 12/31/2020   HGBA1C 5.8 09/01/2016   HGBA1C 6.4 (H) 11/18/2015   Lab Results  Component Value Date   MICROALBUR 0.51 07/31/2009   LDLCALC 68 12/31/2020   CREATININE 0.82 02/09/2021    Lab Results  Component Value Date   NA 141 02/09/2021   K 3.6 02/09/2021   CREATININE 0.82 02/09/2021   GFRNONAA >60 02/09/2021   GFRAA >60 09/11/2020   GLUCOSE 111 (H) 02/09/2021    -Spoke about diet -Country time lemonade -Can't work out but doing rehab soon (Foot is broken so can't do anything now) Plan: At goal,  patient stable/ symptoms controlled   Breast Cx Hx -Tamoxifen Plan: At goal,  patient stable/ symptoms controlled   Gout Lab Results  Component Value Date   LABURIC 3.9 09/29/2020    -Allopurinol 300mg  Plan: At goal,  patient stable/ symptoms controlled  General Cold/Flu  Spoke with patient last month and she wanted to reschedule visit due to a cold/flu. Called and spoke with her today and she stated she's still feeling sick and not up to a visit. Only spoke briefly. I recommended she go to her PCP to get tested and/or checked out. Patient wanted to go back to sleep Plan: F/U 3 months    SDOH (Social Determinants of Health) assessments and interventions performed:    Care Plan  No Known Allergies  Medications Reviewed Today    Reviewed by Willa Frater, CRNA (Certified Registered Nurse Anesthetist) on 04/08/21 at [place] List Status: <None>  Medication Order Taking? Sig Documenting Provider Last  Dose Status Informant  acetaminophen (TYLENOL) 500 MG tablet [id number] Yes Take 2 tablets (1,000 mg total) by mouth every 8 (eight) hours as needed. Carlyle Dolly, MD Past Week Unknown time Active Self  allopurinol (ZYLOPRIM) 300 MG tablet [id number] Yes TAKE 1 TABLET BY MOUTH EVERY DAY [name], MD 04/07/2021 Unknown time Active   amLODipine (NORVASC) 5 MG tablet [id number] Yes Take 1 tablet (5 mg total) by mouth daily. [name], MD 04/07/2021 Unknown time Active Self  ascorbic acid (VITAMIN C) 500 MG tablet [id number] Yes Take 500 mg by mouth daily at 10 pm. [provider] Past Week Unknown time Active Self  aspirin EC 81 MG tablet [id number] Yes Take 81 mg by mouth daily at 10 pm.  [provider] 03/31/2021 Active Self  atorvastatin (LIPITOR) 40 MG tablet [id number] Yes Take 1 tablet (40 mg total) by mouth daily at 6 PM. [name], MD 04/07/2021 Unknown time Active Self  camphor-menthol [facility]) lotion [id number] Yes Apply topically as needed for itching. Eugenie Filler, MD Past Week Unknown time Active Self  ELDERBERRY PO [id number] Yes Take 1,000 mg by mouth daily at 10 pm. [provider] Past Week Unknown time Active Self  Ginkgo Biloba 120 MG TABS [id number] Yes Take 120 mg by mouth daily at 10 pm. [provider] Past Week Unknown time Active Self  isosorbide mononitrate (IMDUR) 60 MG 24 hr tablet [id number] Yes TAKE 1 TABLET (60 MG TOTAL) BY MOUTH AT BEDTIME. [name], MD 04/07/2021 Unknown time Active Self  KLOR-CON M20 20 MEQ tablet [id number] Yes TAKE 1 TABLET (20 MEQ TOTAL) BY MOUTH 3 (THREE) TIMES DAILY. [name], MD Past Week Unknown time Active   methadone (DOLOPHINE) 5 MG tablet [id number] Yes TAKE 3 TABLETS BY MOUTH EVERY 8 HOURS FOR PAIN Causey, Charlestine Massed, NP Past Week Unknown time Active   methadone (DOLOPHINE) 5 MG tablet [id number] Yes TAKE 3 TABLETS BY MOUTH EVERY 8 HOURS AS NEEDED FOR PAIN Magrinat, Virgie Dad, MD Past Week Unknown time Active   metoprolol succinate (TOPROL-XL) 50 MG 24 hr tablet [id number] Yes Take 2 tablets (100 mg total) by mouth daily. Take with or immediately following a meal. [name], MD 04/07/2021 Unknown time Active   nitroGLYCERIN (NITROSTAT) 0.4 MG SL tablet [id number] Yes Place 1 tablet (0.4 mg total) under the tongue every 5 (five) minutes as needed for chest pain. Richardson Dopp T, PA-C 03/30/2021 Unknown time Active Self  OVER THE COUNTER MEDICATION [id number] Yes Zinc 30 mg daily as needed [provider] Past Week Unknown time Active   OVER THE COUNTER MEDICATION [id number] Yes Ear drying drops - as needed [provider] Past Week Unknown time Active   tamoxifen (NOLVADEX) 20 MG tablet [id number] Yes Take 1 tablet (20 mg total)  by mouth daily. Magrinat, Virgie Dad, MD 04/07/2021 Unknown time Active           Patient Active Problem List   Diagnosis Date Noted  . Prediabetes 01/01/2021  . Concern about ear disease without diagnosis 01/01/2021  . Neck mass 11/05/2020  . Arthritis of right knee 06/11/2020  . Acquired absence of both breasts 04/01/2020  . Left facial swelling 02/22/2020  . Left arm cellulitis 09/28/2019  . Cellulitis 09/28/2019  . Cellulitis of left arm   . Bilateral leg edema 06/21/2019  . Dysfunctional uterine bleeding 04/30/2019  . Right leg pain 09/13/2018  . Acquired trigger finger of right ring finger 05/29/2018  . Hyperlipidemia 02/08/2018  . History of ST elevation myocardial infarction (STEMI) 12/01/2017  . Endometrial hyperplasia, simple 11/08/2017  . Unilateral primary osteoarthritis, right knee 10/27/2017  . Abdominal mass 08/15/2017  . Hypertensive disorder 04/17/2016  . Dyslipidemia 04/17/2016  . CAD (coronary artery disease) 04/17/2016  . Thrombocytosis 03/10/2015  . Tobacco abuse 12/02/2014  . Cancer of overlapping sites of right female breast ([place]) 09/16/2014  . Breast cancer of upper-outer quadrant of left  female breast ([place]) 09/16/2014  . Hot flashes due to tamoxifen 05/09/2014  . Lymphedema of arm 11/08/2013  . Chronic pain 03/31/2012  . Hypokalemia 03/31/2012  . Atherosclerosis of aorta ([place])     Conditions to be addressed/monitored: CAD, HTN and DM   Care Plan : Medication Management  Updates made by Lane Hacker, [place] since 01/01/2021 12:00 AM    Problem: Health Promotion or Disease Self-Management (General Plan of Care)     Long-Range Goal: Medication Management   This Visit's Progress: On track  Priority: High  Note:   Current Barriers:  . Patient doesn't know what meds are for and specifics or generalities about disease states . Patient broke foot recently and is unable to ambulate. This is causing a lot of stress  Pharmacist Clinical Goal(s):  Marland Kitchen Over the next 90 days, patient will contact provider office for questions/concerns as evidenced notation of same in electronic health record through collaboration with PharmD and provider.  .   Interventions: . Inter-disciplinary care team collaboration (see longitudinal plan of care) . Comprehensive medication review performed; medication list updated in electronic medical record  @RXCPDIABETES @ @RXCPHYPERTENSION @ @RXCPHYPERLIPIDEMIA @  Patient Goals/Self-Care Activities . Over the next 90 days, patient will:  - take medications as prescribed  Follow Up Plan: The care management team will reach out to the patient again over the next 90 days.     Task: Mutually Develop and Royce Macadamia Achievement of Patient Goals   Note:   Care Management Activities:        Notes:      Medication Assistance: None required. Patient affirms current coverage meets needs.   Follow up: Agree Plan: The care management team will reach out to the patient again over the next 90 days.   [place] Constable, Pharm.D., Managed Medicaid Pharmacist - [phone]

## 2021-05-28 ENCOUNTER — Ambulatory Visit: Payer: No Typology Code available for payment source | Admitting: Rehabilitation

## 2021-05-28 DIAGNOSIS — S8262XA Displaced fracture of lateral malleolus of left fibula, initial encounter for closed fracture: Secondary | ICD-10-CM | POA: Diagnosis not present

## 2021-05-29 DIAGNOSIS — S8262XA Displaced fracture of lateral malleolus of left fibula, initial encounter for closed fracture: Secondary | ICD-10-CM | POA: Diagnosis not present

## 2021-05-30 ENCOUNTER — Emergency Department (HOSPITAL_COMMUNITY)
Admission: EM | Admit: 2021-05-30 | Discharge: 2021-05-30 | Discharge status: Against Medical Advice | Payer: Medicaid Other

## 2021-05-30 ENCOUNTER — Other Ambulatory Visit: Payer: Self-pay

## 2021-05-30 DIAGNOSIS — S8262XA Displaced fracture of lateral malleolus of left fibula, initial encounter for closed fracture: Secondary | ICD-10-CM | POA: Diagnosis not present

## 2021-05-30 NOTE — ED Notes (Signed)
Patient called for triage x3 without answer.

## 2021-05-31 DIAGNOSIS — S8262XA Displaced fracture of lateral malleolus of left fibula, initial encounter for closed fracture: Secondary | ICD-10-CM | POA: Diagnosis not present

## 2021-06-01 ENCOUNTER — Other Ambulatory Visit: Payer: Self-pay | Admitting: Obstetrics and Gynecology

## 2021-06-01 ENCOUNTER — Other Ambulatory Visit: Payer: Self-pay | Admitting: Oncology

## 2021-06-01 ENCOUNTER — Telehealth: Payer: Self-pay

## 2021-06-01 ENCOUNTER — Other Ambulatory Visit (HOSPITAL_COMMUNITY): Payer: Self-pay

## 2021-06-01 ENCOUNTER — Other Ambulatory Visit: Payer: Self-pay | Admitting: Hematology and Oncology

## 2021-06-01 DIAGNOSIS — C50412 Malignant neoplasm of upper-outer quadrant of left female breast: Secondary | ICD-10-CM

## 2021-06-01 DIAGNOSIS — S8262XA Displaced fracture of lateral malleolus of left fibula, initial encounter for closed fracture: Secondary | ICD-10-CM | POA: Diagnosis not present

## 2021-06-01 MED ORDER — METHADONE HCL 5 MG PO TABS
15.0000 mg | ORAL_TABLET | Freq: Three times a day (TID) | ORAL | 0 refills | Status: DC | PRN
  Filled 2021-06-01 – 2021-06-02 (×2): qty 270, 30d supply, fill #0

## 2021-06-01 MED ORDER — METHADONE HCL 5 MG PO TABS
ORAL_TABLET | ORAL | 0 refills | Status: DC
  Filled 2021-06-01: qty 270, 30d supply, fill #0
  Filled ????-??-??: fill #0

## 2021-06-01 NOTE — Telephone Encounter (Signed)
Pt called to request refill for Dolophine. This request was routed to Dr Lindi Adie. Pt aware and verbalizes thanks.

## 2021-06-01 NOTE — Patient Outreach (Signed)
Care Coordination  06/01/2021  NYJA WESTBROOK [birth year] [id number]   Medicaid Managed Care   Unsuccessful Outreach Note  06/01/2021 Name: KANESHA CADLE MRN: [id number] DOB: [birth year]  Referred by: [name], MD Reason for referral : High Risk Managed Medicaid (Unsuccessful telephone outreach)   A second unsuccessful telephone outreach was attempted today. The patient was referred to the case management team for assistance with care management and care coordination.   Follow Up Plan: A member of the Managed Medicaid care management team will reach out to the patient again over the next 7-14 days.   Aida Raider RN, BSN [place]  Tri[facility] Curator - Managed Medicaid High Risk [phone].

## 2021-06-01 NOTE — Patient Instructions (Signed)
Visit Information  Ms. [name]  - as a part of your Medicaid benefit, you are eligible for care management and care coordination services at no cost or copay. I was unable to reach you by phone today but would be happy to help you with your health related needs. Please feel free to call me at [phone].  A member of the Managed Medicaid care management team will reach out to you again over the next 7 -14 days.  Aida Raider RN, BSN [place]  Tri[facility] Curator - Managed Medicaid High Risk [phone].

## 2021-06-02 ENCOUNTER — Other Ambulatory Visit (HOSPITAL_COMMUNITY): Payer: Self-pay

## 2021-06-02 DIAGNOSIS — S8262XA Displaced fracture of lateral malleolus of left fibula, initial encounter for closed fracture: Secondary | ICD-10-CM | POA: Diagnosis not present

## 2021-06-03 DIAGNOSIS — S8262XA Displaced fracture of lateral malleolus of left fibula, initial encounter for closed fracture: Secondary | ICD-10-CM | POA: Diagnosis not present

## 2021-06-04 DIAGNOSIS — S8262XA Displaced fracture of lateral malleolus of left fibula, initial encounter for closed fracture: Secondary | ICD-10-CM | POA: Diagnosis not present

## 2021-06-05 ENCOUNTER — Ambulatory Visit: Payer: Medicaid Other | Admitting: Obstetrics & Gynecology

## 2021-06-05 DIAGNOSIS — S8262XA Displaced fracture of lateral malleolus of left fibula, initial encounter for closed fracture: Secondary | ICD-10-CM | POA: Diagnosis not present

## 2021-06-06 DIAGNOSIS — S8262XA Displaced fracture of lateral malleolus of left fibula, initial encounter for closed fracture: Secondary | ICD-10-CM | POA: Diagnosis not present

## 2021-06-07 DIAGNOSIS — S8262XA Displaced fracture of lateral malleolus of left fibula, initial encounter for closed fracture: Secondary | ICD-10-CM | POA: Diagnosis not present

## 2021-06-18 ENCOUNTER — Ambulatory Visit: Payer: No Typology Code available for payment source | Attending: Oncology | Admitting: Rehabilitation

## 2021-06-23 ENCOUNTER — Ambulatory Visit: Payer: Medicaid Other

## 2021-06-23 NOTE — Progress Notes (Deleted)
    SUBJECTIVE:   CHIEF COMPLAINT / HPI:   Can't hard chew and skin breaking out ***  PERTINENT  PMH / PSH: CAD, HTN, breast cancer s/p bilateral mastectomy, HLD, prediabetes  OBJECTIVE:   LMP 08/07/2011   General: ***, NAD CV: RRR, no murmurs*** Pulm: CTAB, no wheezes or rales  ASSESSMENT/PLAN:   No problem-specific Assessment & Plan notes found for this encounter.     Zola Button, MD [place]   {    This will disappear when note is signed, click to select method of visit    :1}

## 2021-06-23 NOTE — Progress Notes (Deleted)
    SUBJECTIVE:   CHIEF COMPLAINT / HPI:   ***  PERTINENT  PMH / PSH: ***  OBJECTIVE:   LMP 08/07/2011    General: Alert, no acute distress Cardio: Normal S1 and S2, RRR, no r/m/g Pulm: CTAB, normal work of breathing Abdomen: Bowel sounds normal. Abdomen soft and non-tender.  Extremities: No peripheral edema.  Neuro: Cranial nerves grossly intact   ASSESSMENT/PLAN:   No problem-specific Assessment & Plan notes found for this encounter.    Colonoscopy @ age 38-75 PAP q53yrs  @ age 51-65 PAP q74yrs @ age 27 with HPV co-testing Mammogram q57yrs @ age 3-74 CT lung low dose > 20ppd active smoker or quit in last 25 years @ age 85-80 Abd U/S @ age 54-75 if smoker DEXA Scan  Screening @ age 47 DEXA Scan Preventive < 53 postmenopausal Hep C screening HIV screening COVID Vaccine Pneumonia Vaccine @ age 93 Influenza Vaccine TdApt Shingle Vaccine @ age 35 PHQ9 @ age 53-18 Fall Prevention @ age 20 Folic Acid 0.4-0.8mg  A1c if prediabetes/Type 2/ overweight @ age 46-70  Statin @ age 30-75 no history CVD/1 or more risk factor/ 42yr CVD risk> 10%  4yr, MD [place]

## 2021-06-24 ENCOUNTER — Ambulatory Visit: Payer: Medicaid Other | Admitting: Family Medicine

## 2021-06-25 ENCOUNTER — Ambulatory Visit: Payer: Medicaid Other | Admitting: Family Medicine

## 2021-06-29 ENCOUNTER — Other Ambulatory Visit: Payer: Self-pay | Admitting: *Deleted

## 2021-06-29 DIAGNOSIS — C50412 Malignant neoplasm of upper-outer quadrant of left female breast: Secondary | ICD-10-CM

## 2021-06-29 NOTE — Progress Notes (Deleted)
    SUBJECTIVE:   CHIEF COMPLAINT / HPI:   ***  PERTINENT  PMH / PSH: CAD, HTN, breast cancer s/p bilateral mastectomy, HLD, prediabetes  OBJECTIVE:   LMP 08/07/2011   General: ***, NAD CV: RRR, no murmurs*** Pulm: CTAB, no wheezes or rales  ASSESSMENT/PLAN:   No problem-specific Assessment & Plan notes found for this encounter.     Zola Button, MD [place]   {    This will disappear when note is signed, click to select method of visit    :1}

## 2021-06-30 ENCOUNTER — Ambulatory Visit: Payer: Medicaid Other

## 2021-06-30 ENCOUNTER — Other Ambulatory Visit (HOSPITAL_COMMUNITY): Payer: Self-pay

## 2021-06-30 ENCOUNTER — Other Ambulatory Visit: Payer: Self-pay | Admitting: Oncology

## 2021-06-30 DIAGNOSIS — C50412 Malignant neoplasm of upper-outer quadrant of left female breast: Secondary | ICD-10-CM

## 2021-06-30 MED ORDER — METHADONE HCL 5 MG PO TABS
15.0000 mg | ORAL_TABLET | Freq: Three times a day (TID) | ORAL | 0 refills | Status: DC | PRN
  Filled 2021-06-30: qty 270, 30d supply, fill #0

## 2021-07-02 ENCOUNTER — Other Ambulatory Visit: Payer: Self-pay | Admitting: Obstetrics and Gynecology

## 2021-07-02 ENCOUNTER — Other Ambulatory Visit: Payer: Self-pay

## 2021-07-02 NOTE — Patient Instructions (Signed)
Visit Information  [name] was given information about Medicaid Managed Care team care coordination services as a part of their [place] Medicaid benefit. [name] verbally consented to engagement with the [facility] Managed Care team.   For questions related to your [facility], please call: [phone] or visit the homepage here: [email or web address]  If you would like to schedule transportation through your [facility], please call the following number at least 2 days in advance of your appointment: [phone].   Call the [place] at [phone], at any time, 24 hours a day, 7 days a week. If you are in danger or need immediate medical attention call 911.  If you would like help to quit smoking, call 1-800-QUIT-NOW 763-398-3439) OR Espaol: 1-855-Djelo-Ya (1-761-607-3710) o para ms informacin haga clic aqu or Text READY to 200-400 to register via text  [name] - following are the goals we discussed in your visit today:   Goals Addressed             This Visit's Progress    Protect My Health       Timeframe:  Long-Range Goal Priority:  Medium Start Date:    07/02/21                         Expected End Date: ongoing                     Follow Up Date 08/02/2021    - schedule appointment for flu shot - schedule appointment for vaccines needed due to my age or health - schedule recommended health tests (blood work, mammogram, colonoscopy, pap test) - schedule and keep appointment for annual check-up    Why is this important?   Screening tests can find diseases early when they are easier to treat.  Your doctor or nurse will talk with you about which tests are important for you.  Getting shots for common diseases like the flu and shingles will help prevent them.      Patient verbalizes understanding of instructions  provided today.   The Managed Medicaid care management team will reach out to the patient again over the next 30 days.  The  Patient has been provided with contact information for the Managed Medicaid care management team and has been advised to call with any health related questions or concerns.   Savannah Raider RN, BSN [place]  Triad Curator - Managed Medicaid High Risk [phone].   Following is a copy of your plan of care:  Patient Care Plan: General Plan of Care (Adult)     Problem Identified: Health Promotion or Disease Self-Management (General Plan of Care)   Priority: High  Onset Date: 12/30/2020     Problem Identified: Harm or Injury   Priority: Medium  Onset Date: 12/30/2020     Long-Range Goal: Harm or Injury Prevented   Start Date: 12/30/2020  Expected End Date: 10/01/2021  Recent Progress: On track  Priority: Medium  Note:   Current Barriers:  Patient with recent fall and fracture requiring surgery.   07/01/21: Patient left foot improved-wearing shoe and uses walker/cane as needed.  Ongoing lymphedema-stable.  ? Uterine fibroids-has GYN appointment scheduled.  Smoker-3-4 cigarettes a day.  Nurse Case Manager Clinical Goals Over the next 30 days, patient will attend all scheduled medical appointments:  Interventions:  Inter-disciplinary care team collaboration (see  longitudinal plan of care) Evaluation of current treatment plan related to fracture and patient's adherence to plan as established by provider. Provided patient with 1-800-QUITNOW number-patient smokes 3-4 cigarettes a day and is trying to stop on her own-is not interested in medications right now. Reviewed medications with patient. Discussed plans with patient for ongoing care management follow up and provided patient with direct contact information for care management team Pharmacy referral for medication review. Update 07/01/21:  Patient met with Pharmacist and  is following.  Patient Goals/Self-Care Activities Over the next 30 days, patient will:  -Attends all scheduled provider appointments Calls provider office for new concerns or questions  Follow Up Plan: The Managed Medicaid care management team will reach out to the patient again over the next 30 days.  The patient has been provided with contact information for the Managed Medicaid care management team and has been advised to call with any health related questions or concerns.

## 2021-07-02 NOTE — Patient Outreach (Signed)
Medicaid Managed Care   Nurse Care Manager Note  07/02/2021 Name:  [name] MRN:  [id number] DOB:  1961-08-09  [name] is an 60 y.o. year old female who is a primary patient of Savannah Haw, MD.  The [facility] Managed Care Coordination team was consulted for assistance with:    Chronic healthcare management needs.  [name] was given information about Medicaid Managed Care Coordination team services today. [name] agreed to services and verbal consent obtained.  Engaged with patient by telephone for follow up visit in response to provider referral for case management and/or care coordination services.   Assessments/Interventions:  Review of past medical history, allergies, medications, health status, including review of consultants reports, laboratory and other test data, was performed as part of comprehensive evaluation and provision of chronic care management services.  SDOH (Social Determinants of Health) assessments and interventions performed:   Care Plan  No Known Allergies  Medications Reviewed Today     Reviewed by Savannah Medicus, RN (Registered Nurse) on 07/02/21 at 1017  Med List Status: <None>   Medication Order Taking? Sig Documenting Provider Last Dose Status Informant  acetaminophen (TYLENOL) 500 MG tablet [id number] Yes Take 2 tablets (1,000 mg total) by mouth every 8 (eight) hours as needed. Savannah Dolly, MD Taking Active Self  allopurinol (ZYLOPRIM) 300 MG tablet [id number] Yes TAKE 1 TABLET BY MOUTH EVERY DAY Savannah Haw, MD Taking Active   amLODipine (NORVASC) 5 MG tablet [id number] Yes Take 1 tablet (5 mg total) by mouth daily. Savannah Haw, MD Taking Active   ascorbic acid (VITAMIN C) 500 MG tablet [id number] Yes Take 500 mg by mouth daily. [provider] Taking Active Self  aspirin EC 81 MG tablet [id number] Yes Take 81 mg by mouth daily. [provider] Taking Active Self  atorvastatin (LIPITOR) 40 MG tablet  [id number] Yes Take 1 tablet (40 mg total) by mouth daily at 6 PM.  Patient taking differently: Take 40 mg by mouth daily.   Savannah Haw, MD Taking Active   camphor-menthol [facility]) lotion [id number] Yes Apply topically as needed for itching. Eugenie Filler, MD Taking Active Self  ELDERBERRY PO [id number] Yes Take 1,000 mg by mouth daily. [provider] Taking Active Self    Discontinued 03/10/12 1404 (Error)   Ginkgo Biloba 120 MG TABS [id number] Yes Take 120 mg by mouth daily. [provider] Taking Active Self  isosorbide mononitrate (IMDUR) 60 MG 24 hr tablet [id number] No Take 1 tablet (60 mg total) by mouth at bedtime.  Patient not taking: Reported on 07/02/2021   Savannah Haw, MD Not Taking Active   KLOR-CON M20 20 MEQ tablet [id number] Yes TAKE 1 TABLET (20 MEQ TOTAL) BY MOUTH 3 (THREE) TIMES DAILY. Savannah Haw, MD Taking Active   methadone (DOLOPHINE) 5 MG tablet [id number] Yes Take 3 tablets (15 mg total) by mouth every 8 (eight) hours as needed. Magrinat, Virgie Dad, MD Taking Active   metoprolol succinate (TOPROL-XL) 50 MG 24 hr tablet [id number] Yes Take 2 tablets (100 mg total) by mouth daily. Take with or immediately following a meal. Savannah Haw, MD Taking Active   nitroGLYCERIN (NITROSTAT) 0.4 MG SL tablet [id number] Yes Place 1 tablet (0.4 mg total) under the tongue every 5 (five) minutes as needed for chest pain. Richardson Dopp T, PA-C Taking Active Self  OVER THE COUNTER MEDICATION [id number] Yes Zinc 30 mg daily as needed [provider] Taking Active   OVER THE COUNTER MEDICATION  [id number] Yes Ear drying drops - as needed [provider] Taking Active   oxyCODONE (OXY IR/ROXICODONE) 5 MG immediate release tablet [id number] No Take 1-2 tablets (5-10 mg total) by mouth every 6 (six) hours as needed for severe pain.  Patient not taking: Reported on 07/02/2021   Stark Klein, MD Not Taking Active   tamoxifen (NOLVADEX) 20 MG tablet [id number] Yes  Take 1 tablet (20 mg total) by mouth daily. Magrinat, Virgie Dad, MD Taking Active             Patient Active Problem List   Diagnosis Date Noted   Prediabetes 01/01/2021   Concern about ear disease without diagnosis 01/01/2021   Neck mass 11/05/2020   Arthritis of right knee 06/11/2020   Acquired absence of both breasts 04/01/2020   Left facial swelling 02/22/2020   Left arm cellulitis 09/28/2019   Cellulitis 09/28/2019   Cellulitis of left arm    Bilateral leg edema 06/21/2019   Dysfunctional uterine bleeding 04/30/2019   Right leg pain 09/13/2018   Acquired trigger finger of right ring finger 05/29/2018   Hyperlipidemia 02/08/2018   History of ST elevation myocardial infarction (STEMI) 12/01/2017   Endometrial hyperplasia, simple 11/08/2017   Unilateral primary osteoarthritis, right knee 10/27/2017   Abdominal mass 08/15/2017   Hypertensive disorder 04/17/2016   Dyslipidemia 04/17/2016   CAD (coronary artery disease) 04/17/2016   Thrombocytosis 03/10/2015   Tobacco abuse 12/02/2014   Cancer of overlapping sites of right female breast ([place]) 09/16/2014   Breast cancer of upper-outer quadrant of left female breast ([place]) 09/16/2014   Hot flashes due to tamoxifen 05/09/2014   Lymphedema of arm 11/08/2013   Chronic pain 03/31/2012   Hypokalemia 03/31/2012   Atherosclerosis of aorta ([facility])     Conditions to be addressed/monitored per PCP order:   chronic healthcare management needs,  lymphedema, chronic pain, tobacco use, h/o breast cancer  Care Plan : General Plan of Care (Adult)  Updates made by Savannah Medicus, RN since 07/02/2021 12:00 AM     Problem: Harm or Injury   Priority: Medium  Onset Date: 12/30/2020     Long-Range Goal: Harm or Injury Prevented   Start Date: 12/30/2020  Expected End Date: 10/01/2021  Recent Progress: On track  Priority: Medium  Note:   Current Barriers:  Patient with recent fall and fracture requiring surgery.   07/01/21: Patient left foot  improved-wearing shoe and uses walker/cane as needed.  Ongoing lymphedema-stable.  ? Uterine fibroids-has GYN appointment scheduled.  Smoker-3-4 cigarettes a day.  Nurse Case Manager Clinical Goals Over the next 30 days, patient will attend all scheduled medical appointments:  Interventions:  Inter-disciplinary care team collaboration (see longitudinal plan of care) Evaluation of current treatment plan related to fracture and patient's adherence to plan as established by provider. Provided patient with 1-800-QUITNOW number-patient smokes 3-4 cigarettes a day and is trying to stop on her own-is not interested in medications right now. Reviewed medications with patient. Discussed plans with patient for ongoing care management follow up and provided patient with direct contact information for care management team Pharmacy referral for medication review. Update 07/01/21:  Patient met with Pharmacist and is following.  Patient Goals/Self-Care Activities Over the next 30 days, patient will:  -Attends all scheduled provider appointments Calls provider office for new concerns or questions  Follow Up Plan: The Managed Medicaid care management team will reach out to the patient again over the next 30 days.  The patient has been provided with contact  information for the Managed Medicaid care management team and has been advised to call with any health related questions or concerns.     Follow Up:  Patient agrees to Care Plan and Follow-up.  Plan: The Managed Medicaid care management team will reach out to the patient again over the next 30 days. and The patient has been provided with contact information for the Managed Medicaid care management team and has been advised to call with any health related questions or concerns.  Date/time of next scheduled RN care management/care coordination outreach:  07/28/21 at 0900.

## 2021-07-03 ENCOUNTER — Ambulatory Visit: Payer: Medicaid Other | Admitting: Student

## 2021-07-03 NOTE — Progress Notes (Deleted)
    SUBJECTIVE:   CHIEF COMPLAINT / HPI: Can chew hard things and skin is breaking out  Meds review-- Smoking?  Chewing Issues Onset  What makes it worse Quality of pain Relieving Time it happens Any swallowing issues? What types of food?  Skin Issues Onset  What makes it worse Quality of pain Relieving Time it happens  Any new medications? Any fevers or sickness? Any new lotions, detergents?  PERTINENT  PMH / PSH: ***  OBJECTIVE:   LMP 08/07/2011   Physical Exam   ASSESSMENT/PLAN:   No problem-specific Assessment & Plan notes found for this encounter.     Gerrit Heck, MD [place]

## 2021-07-06 ENCOUNTER — Ambulatory Visit: Payer: Medicaid Other

## 2021-07-08 ENCOUNTER — Ambulatory Visit: Payer: Medicaid Other | Admitting: Family Medicine

## 2021-07-08 ENCOUNTER — Ambulatory Visit: Payer: Medicaid Other | Admitting: Obstetrics & Gynecology

## 2021-07-08 ENCOUNTER — Encounter: Payer: Self-pay | Admitting: Obstetrics & Gynecology

## 2021-07-08 ENCOUNTER — Ambulatory Visit (INDEPENDENT_AMBULATORY_CARE_PROVIDER_SITE_OTHER): Payer: Medicaid Other | Admitting: Obstetrics & Gynecology

## 2021-07-08 ENCOUNTER — Other Ambulatory Visit (HOSPITAL_COMMUNITY)
Admission: RE | Admit: 2021-07-08 | Discharge: 2021-07-08 | Disposition: A | Discharge status: Home / Self Care | Payer: Medicaid Other | Source: Ambulatory Visit | Attending: Obstetrics & Gynecology | Admitting: Obstetrics & Gynecology

## 2021-07-08 ENCOUNTER — Other Ambulatory Visit: Payer: Self-pay

## 2021-07-08 VITALS — BP 122/66 | HR 63 | Wt 237.0 lb

## 2021-07-08 DIAGNOSIS — D219 Benign neoplasm of connective and other soft tissue, unspecified: Secondary | ICD-10-CM | POA: Diagnosis not present

## 2021-07-08 DIAGNOSIS — Z124 Encounter for screening for malignant neoplasm of cervix: Secondary | ICD-10-CM

## 2021-07-08 DIAGNOSIS — A5901 Trichomonal vulvovaginitis: Secondary | ICD-10-CM | POA: Diagnosis not present

## 2021-07-08 DIAGNOSIS — N898 Other specified noninflammatory disorders of vagina: Secondary | ICD-10-CM | POA: Diagnosis not present

## 2021-07-08 DIAGNOSIS — R140 Abdominal distension (gaseous): Secondary | ICD-10-CM | POA: Diagnosis not present

## 2021-07-08 DIAGNOSIS — R1084 Generalized abdominal pain: Secondary | ICD-10-CM | POA: Diagnosis not present

## 2021-07-08 HISTORY — DX: Trichomonal vulvovaginitis: A59.01

## 2021-07-08 NOTE — Progress Notes (Deleted)
    SUBJECTIVE:   CHIEF COMPLAINT / HPI:   ***  PERTINENT  PMH / PSH: ***  OBJECTIVE:   LMP 08/07/2011   ***  ASSESSMENT/PLAN:   No problem-specific Assessment & Plan notes found for this encounter.     Gladys Damme, MD [place]
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
## 2021-07-09 ENCOUNTER — Telehealth: Payer: Self-pay

## 2021-07-09 ENCOUNTER — Encounter: Payer: Self-pay | Admitting: Obstetrics & Gynecology

## 2021-07-09 ENCOUNTER — Other Ambulatory Visit (HOSPITAL_COMMUNITY): Payer: Self-pay

## 2021-07-09 ENCOUNTER — Encounter: Payer: Medicaid Other | Admitting: Rehabilitation

## 2021-07-09 LAB — CERVICOVAGINAL ANCILLARY ONLY
Bacterial Vaginitis (gardnerella): NEGATIVE
Candida Glabrata: NEGATIVE
Candida Vaginitis: NEGATIVE
Chlamydia: NEGATIVE
Comment: NEGATIVE
Comment: NEGATIVE
Comment: NEGATIVE
Comment: NEGATIVE
Comment: NEGATIVE
Comment: NORMAL
Neisseria Gonorrhea: NEGATIVE
Trichomonas: POSITIVE — AB

## 2021-07-09 MED ORDER — METRONIDAZOLE 500 MG PO TABS
500.0000 mg | ORAL_TABLET | Freq: Two times a day (BID) | ORAL | 0 refills | Status: AC
Start: 2021-07-09 — End: 2021-07-16
  Filled 2021-07-09: qty 14, 7d supply, fill #0

## 2021-07-09 NOTE — Telephone Encounter (Signed)
Patient given test results and advised to abstain for sexual intercourse for 7-10 days. TOC in four weeks, patient will call back to scheduled.

## 2021-07-09 NOTE — Addendum Note (Signed)
Addended by: Verita Schneiders A on: 07/09/2021 02:58 PM   Modules accepted: Orders

## 2021-07-09 NOTE — Telephone Encounter (Signed)
-----   Message from Osborne Oman, MD sent at 07/09/2021  2:58 PM EDT ----- Patient has trichomonal vaginitis.  Recommend testing for other STIs, also needs to let partner(s) know so the partner(s) can get testing and treatment. Patient and sex partner(s) should abstain from unprotected sexual activity for seven days after everyone receives appropriate treatment.  Metronidazole was prescribed for patient.  Patient can return in about 4 weeks after treatment for repeat test of cure.  Please call to inform patient of results and recommendations, and advise to pick up prescription and take as directed.  Please advise patient to practice safe sex at all times.

## 2021-07-10 ENCOUNTER — Encounter: Payer: Self-pay | Admitting: Family Medicine

## 2021-07-10 LAB — CYTOLOGY - PAP
Comment: NEGATIVE
Diagnosis: NEGATIVE
High risk HPV: NEGATIVE

## 2021-07-10 NOTE — Progress Notes (Signed)
Patient has no showed or has had same day cancellations on May 4th, 10th, 18th and July 5th, 6th, 7th, 12th, 15th, 17th, and 20th. Patient started on my patient panel on June 19, 2021. Certified letter sent.  Gladys Damme, MD [place] [facility], PGY-3

## 2021-07-13 ENCOUNTER — Inpatient Hospital Stay: Payer: No Typology Code available for payment source

## 2021-07-13 ENCOUNTER — Inpatient Hospital Stay: Payer: No Typology Code available for payment source | Admitting: Oncology

## 2021-07-13 ENCOUNTER — Telehealth: Payer: Self-pay

## 2021-07-13 ENCOUNTER — Telehealth: Payer: Self-pay | Admitting: Family Medicine

## 2021-07-13 DIAGNOSIS — S8262XA Displaced fracture of lateral malleolus of left fibula, initial encounter for closed fracture: Secondary | ICD-10-CM | POA: Diagnosis not present

## 2021-07-13 NOTE — Telephone Encounter (Signed)
Contacted pt regarding no show and cancellation policy. She will try not to no show or cancel appointments from now on. Booked clinic visit with me on 07/27/21 at 3:10pm.  [name] PGY-3 [place]

## 2021-07-13 NOTE — Telephone Encounter (Signed)
-----   Message from Osborne Oman, MD sent at 07/09/2021  2:58 PM EDT ----- Patient has trichomonal vaginitis.  Recommend testing for other STIs, also needs to let partner(s) know so the partner(s) can get testing and treatment. Patient and sex partner(s) should abstain from unprotected sexual activity for seven days after everyone receives appropriate treatment.  Metronidazole was prescribed for patient.  Patient can return in about 4 weeks after treatment for repeat test of cure.  Please call to inform patient of results and recommendations, and advise to pick up prescription and take as directed.  Please advise patient to practice safe sex at all times.

## 2021-07-13 NOTE — Telephone Encounter (Signed)
Pt advised of results and recommendations.

## 2021-07-14 ENCOUNTER — Telehealth: Payer: Self-pay

## 2021-07-14 ENCOUNTER — Telehealth: Payer: Self-pay | Admitting: Oncology

## 2021-07-14 NOTE — Telephone Encounter (Signed)
Called pt, no answer. VM was full, unable to leave a msg. Will try to call pt again tomorrow.

## 2021-07-14 NOTE — Telephone Encounter (Signed)
Called pt to schedule an appointment to have BUN, Creatine, and GFR labs drawn before abdomen/pelvic CT scan per [facility] in Imaging. Pt states she will call back to schedule appointment to have labs drawn.  Robt Okuda l Octavious Zidek, CMA

## 2021-07-15 ENCOUNTER — Telehealth: Payer: Self-pay | Admitting: Oncology

## 2021-07-15 NOTE — Telephone Encounter (Signed)
R/s appts per 7/26 sch msg. Pt aware.

## 2021-07-16 NOTE — Telephone Encounter (Signed)
Called pt to schedule lab appt. Pt is scheduled for  07/23/21 at 1pm. Understanding was voiced. Tiena Manansala l Jakylah Bassinger, CMA

## 2021-07-21 ENCOUNTER — Ambulatory Visit: Payer: Medicaid Other

## 2021-07-23 ENCOUNTER — Ambulatory Visit (HOSPITAL_BASED_OUTPATIENT_CLINIC_OR_DEPARTMENT_OTHER): Payer: Medicaid Other

## 2021-07-23 ENCOUNTER — Ambulatory Visit (HOSPITAL_BASED_OUTPATIENT_CLINIC_OR_DEPARTMENT_OTHER): Admission: RE | Admit: 2021-07-23 | Payer: Medicaid Other | Source: Ambulatory Visit
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
## 2021-07-27 ENCOUNTER — Ambulatory Visit (INDEPENDENT_AMBULATORY_CARE_PROVIDER_SITE_OTHER): Payer: Medicaid Other

## 2021-07-27 ENCOUNTER — Inpatient Hospital Stay: Payer: Medicaid Other | Attending: Oncology | Admitting: Oncology

## 2021-07-27 ENCOUNTER — Ambulatory Visit (INDEPENDENT_AMBULATORY_CARE_PROVIDER_SITE_OTHER): Payer: Medicaid Other | Admitting: Family Medicine

## 2021-07-27 ENCOUNTER — Inpatient Hospital Stay: Payer: Medicaid Other

## 2021-07-27 ENCOUNTER — Other Ambulatory Visit: Payer: Self-pay

## 2021-07-27 ENCOUNTER — Encounter: Payer: Self-pay | Admitting: Family Medicine

## 2021-07-27 ENCOUNTER — Other Ambulatory Visit (HOSPITAL_COMMUNITY): Payer: Self-pay

## 2021-07-27 VITALS — BP 138/72 | HR 78 | Temp 97.8°F | Resp 19 | Ht 65.0 in | Wt 237.8 lb

## 2021-07-27 VITALS — BP 118/74 | HR 74 | Ht 65.0 in | Wt 237.6 lb

## 2021-07-27 DIAGNOSIS — C50911 Malignant neoplasm of unspecified site of right female breast: Secondary | ICD-10-CM | POA: Diagnosis present

## 2021-07-27 DIAGNOSIS — G894 Chronic pain syndrome: Secondary | ICD-10-CM | POA: Insufficient documentation

## 2021-07-27 DIAGNOSIS — C50412 Malignant neoplasm of upper-outer quadrant of left female breast: Secondary | ICD-10-CM

## 2021-07-27 DIAGNOSIS — R7303 Prediabetes: Secondary | ICD-10-CM

## 2021-07-27 DIAGNOSIS — Z0000 Encounter for general adult medical examination without abnormal findings: Secondary | ICD-10-CM | POA: Diagnosis not present

## 2021-07-27 DIAGNOSIS — Z170 Estrogen receptor positive status [ER+]: Secondary | ICD-10-CM | POA: Diagnosis not present

## 2021-07-27 DIAGNOSIS — Z923 Personal history of irradiation: Secondary | ICD-10-CM | POA: Diagnosis not present

## 2021-07-27 DIAGNOSIS — Z23 Encounter for immunization: Secondary | ICD-10-CM

## 2021-07-27 DIAGNOSIS — C50912 Malignant neoplasm of unspecified site of left female breast: Secondary | ICD-10-CM | POA: Insufficient documentation

## 2021-07-27 DIAGNOSIS — I10 Essential (primary) hypertension: Secondary | ICD-10-CM

## 2021-07-27 DIAGNOSIS — C50811 Malignant neoplasm of overlapping sites of right female breast: Secondary | ICD-10-CM

## 2021-07-27 DIAGNOSIS — Z79899 Other long term (current) drug therapy: Secondary | ICD-10-CM | POA: Diagnosis not present

## 2021-07-27 DIAGNOSIS — Z9221 Personal history of antineoplastic chemotherapy: Secondary | ICD-10-CM | POA: Insufficient documentation

## 2021-07-27 DIAGNOSIS — Z79810 Long term (current) use of selective estrogen receptor modulators (SERMs): Secondary | ICD-10-CM | POA: Insufficient documentation

## 2021-07-27 DIAGNOSIS — Z9013 Acquired absence of bilateral breasts and nipples: Secondary | ICD-10-CM | POA: Diagnosis not present

## 2021-07-27 DIAGNOSIS — G8929 Other chronic pain: Secondary | ICD-10-CM

## 2021-07-27 DIAGNOSIS — W19XXXA Unspecified fall, initial encounter: Secondary | ICD-10-CM | POA: Insufficient documentation

## 2021-07-27 LAB — CBC WITH DIFFERENTIAL (CANCER CENTER ONLY)
Abs Immature Granulocytes: 0.06 10*3/uL (ref 0.00–0.07)
Basophils Absolute: 0 10*3/uL (ref 0.0–0.1)
Basophils Relative: 0 %
Eosinophils Absolute: 0.2 10*3/uL (ref 0.0–0.5)
Eosinophils Relative: 2 %
HCT: 45.7 % (ref 36.0–46.0)
Hemoglobin: 15.5 g/dL — ABNORMAL HIGH (ref 12.0–15.0)
Immature Granulocytes: 1 %
Lymphocytes Relative: 23 %
Lymphs Abs: 2.4 10*3/uL (ref 0.7–4.0)
MCH: 28.9 pg (ref 26.0–34.0)
MCHC: 33.9 g/dL (ref 30.0–36.0)
MCV: 85.1 fL (ref 80.0–100.0)
Monocytes Absolute: 0.6 10*3/uL (ref 0.1–1.0)
Monocytes Relative: 6 %
Neutro Abs: 7.3 10*3/uL (ref 1.7–7.7)
Neutrophils Relative %: 68 %
Platelet Count: 457 10*3/uL — ABNORMAL HIGH (ref 150–400)
RBC: 5.37 MIL/uL — ABNORMAL HIGH (ref 3.87–5.11)
RDW: 15.9 % — ABNORMAL HIGH (ref 11.5–15.5)
WBC Count: 10.7 10*3/uL — ABNORMAL HIGH (ref 4.0–10.5)
nRBC: 0 % (ref 0.0–0.2)

## 2021-07-27 LAB — POCT GLYCOSYLATED HEMOGLOBIN (HGB A1C): HbA1c, POC (prediabetic range): 6.2 % (ref 5.7–6.4)

## 2021-07-27 LAB — CMP (CANCER CENTER ONLY)
ALT: 23 U/L (ref 0–44)
AST: 21 U/L (ref 15–41)
Albumin: 3.4 g/dL — ABNORMAL LOW (ref 3.5–5.0)
Alkaline Phosphatase: 107 U/L (ref 38–126)
Anion gap: 10 (ref 5–15)
BUN: 9 mg/dL (ref 6–20)
CO2: 24 mmol/L (ref 22–32)
Calcium: 9.1 mg/dL (ref 8.9–10.3)
Chloride: 109 mmol/L (ref 98–111)
Creatinine: 0.85 mg/dL (ref 0.44–1.00)
GFR, Estimated: >60 mL/min (ref >60)
Glucose, Bld: 115 mg/dL — ABNORMAL HIGH (ref 70–99)
Potassium: 3.6 mmol/L (ref 3.5–5.1)
Sodium: 143 mmol/L (ref 135–145)
Total Bilirubin: 0.5 mg/dL (ref 0.3–1.2)
Total Protein: 7 g/dL (ref 6.5–8.1)

## 2021-07-27 MED ORDER — METHADONE HCL 5 MG PO TABS
15.0000 mg | ORAL_TABLET | Freq: Three times a day (TID) | ORAL | 0 refills | Status: DC | PRN
Start: 2021-07-31 — End: 2021-08-31
  Filled 2021-08-03: qty 270, 30d supply, fill #0
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
## 2021-07-27 NOTE — Assessment & Plan Note (Addendum)
Recommended PNA, shingles and COVID vaccines today. Pt declined PNA vaccine.

## 2021-07-27 NOTE — Assessment & Plan Note (Signed)
COVID-19 booster administered today

## 2021-07-27 NOTE — Patient Instructions (Signed)
Thank you for coming to see me today. It was a pleasure. Today we discussed your blood pressure looks good, no changes made to medications today. I think your left knee is fine after the fall. Let me know if you want x-rays and I can order them.  Please follow-up with me as and when you need   If you have any questions or concerns, please do not hesitate to call the office at [phone].  Best wishes,   Dr Posey Pronto

## 2021-07-27 NOTE — Assessment & Plan Note (Signed)
BP at goal. Continue amlodipine and metoprolol.

## 2021-07-27 NOTE — Assessment & Plan Note (Signed)
Obtained A1c today.
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
## 2021-07-28 ENCOUNTER — Other Ambulatory Visit: Payer: Self-pay | Admitting: Obstetrics and Gynecology

## 2021-07-28 ENCOUNTER — Encounter: Payer: Medicaid Other | Admitting: Physical Therapy

## 2021-07-28 LAB — CANCER ANTIGEN 27.29: CA 27.29: 25.3 U/mL (ref 0.0–38.6)

## 2021-07-28 NOTE — Patient Outreach (Signed)
Medicaid Managed Care   Nurse Care Manager Note  07/28/2021 Name:  [name] MRN:  [id number] DOB:  [birth year]  [name] is an 60 y.o. year old female who is a primary patient of Savannah Haw, MD.  The [facility] Managed Care Coordination team was consulted for assistance with:    Chronic healthcare management needs.  [name] was given information about Medicaid Managed Care Coordination team services today. [name] agreed to services and verbal consent obtained.  Engaged with patient by telephone for follow up visit in response to provider referral for case management and/or care coordination services.   Assessments/Interventions:  Review of past medical history, allergies, medications, health status, including review of consultants reports, laboratory and other test data, was performed as part of comprehensive evaluation and provision of chronic care management services.  SDOH (Social Determinants of Health) assessments and interventions performed: SDOH Interventions    Flowsheet Row Most Recent Value  SDOH Interventions   Food Insecurity Interventions Intervention Not Indicated  Financial Strain Interventions Intervention Not Indicated  Housing Interventions Intervention Not Indicated  Intimate Partner Violence Interventions Intervention Not Indicated  Physical Activity Interventions Intervention Not Indicated  Stress Interventions Intervention Not Indicated  Social Connections Interventions Intervention Not Indicated  Transportation Interventions Intervention Not Indicated       Care Plan  No Known Allergies  Medications Reviewed Today     Reviewed by Gayla Medicus, RN (Registered Nurse) on 07/28/21 at [phone]  Med List Status: <None>   Medication Order Taking? Sig Documenting Provider Last Dose Status Informant  acetaminophen (TYLENOL) 500 MG tablet [id number] No Take 2 tablets (1,000 mg total) by mouth every 8 (eight) hours as needed. Carlyle Dolly, MD Taking Active Self  allopurinol (ZYLOPRIM) 300 MG tablet [id number] No TAKE 1 TABLET BY MOUTH EVERY DAY Savannah Haw, MD Taking Active   amLODipine (NORVASC) 5 MG tablet [id number] No Take 1 tablet (5 mg total) by mouth daily. Savannah Haw, MD Taking Active   ascorbic acid (VITAMIN C) 500 MG tablet [id number] No Take 500 mg by mouth daily. [provider] Taking Active Self  aspirin EC 81 MG tablet [id number] No Take 81 mg by mouth daily. [provider] Taking Active Self  atorvastatin (LIPITOR) 40 MG tablet [id number] No Take 1 tablet (40 mg total) by mouth daily at 6 PM.  Patient taking differently: Take 40 mg by mouth daily.   Savannah Haw, MD Taking Active   camphor-menthol [facility]) lotion [id number] No Apply topically as needed for itching.  Patient not taking: Reported on 07/08/2021   Savannah Filler, MD Not Taking Active Self  ELDERBERRY PO [id number] No Take 1,000 mg by mouth daily. [provider] Taking Active Self  Discontinued 03/10/12 1404 (Error)   Ginkgo Biloba 120 MG TABS [id number] No Take 120 mg by mouth daily. [provider] Taking Active Self  isosorbide mononitrate (IMDUR) 60 MG 24 hr tablet [id number] No Take 1 tablet (60 mg total) by mouth at bedtime.  Patient not taking: No sig reported   Savannah Haw, MD Not Taking Active   KLOR-CON M20 20 MEQ tablet [id number] No TAKE 1 TABLET (20 MEQ TOTAL) BY MOUTH 3 (THREE) TIMES DAILY. Savannah Haw, MD Taking Active   methadone (DOLOPHINE) 5 MG tablet [id number]  Take 3 tablets (15 mg total) by mouth every 8 (eight) hours as needed. Magrinat, Virgie Dad, MD  Active   metoprolol succinate (TOPROL-XL) 50 MG 24 hr tablet [id number] No  Take 2 tablets (100 mg total) by mouth daily. Take with or immediately following a meal. Savannah Haw, MD Taking Active   nitroGLYCERIN (NITROSTAT) 0.4 MG SL tablet [id number] No Place 1 tablet (0.4 mg total) under the tongue every 5 (five) minutes as needed  for chest pain. Savannah Dopp T, PA-C Taking Active Self  OVER THE COUNTER MEDICATION [id number] No Zinc 30 mg daily as needed [provider] Taking Active   OVER THE COUNTER MEDICATION [id number] No Ear drying drops - as needed [provider] Taking Active   oxyCODONE (OXY IR/ROXICODONE) 5 MG immediate release tablet [id number] No Take 1-2 tablets (5-10 mg total) by mouth every 6 (six) hours as needed for severe pain.  Patient not taking: No sig reported   Savannah Klein, MD Not Taking Active   tamoxifen (NOLVADEX) 20 MG tablet [id number] No Take 1 tablet (20 mg total) by mouth daily. Magrinat, Virgie Dad, MD Taking Active             Patient Active Problem List   Diagnosis Date Noted   COVID-19 vaccine administered 07/27/2021   Fall 07/27/2021   Prediabetes 01/01/2021   Concern about ear disease without diagnosis 01/01/2021   Neck mass 11/05/2020   Arthritis of right knee 06/11/2020   Acquired absence of both breasts 04/01/2020   Left facial swelling 02/22/2020   Left arm cellulitis 09/28/2019   Cellulitis 09/28/2019   Cellulitis of left arm    Bilateral leg edema 06/21/2019   Dysfunctional uterine bleeding 04/30/2019   Right leg pain 09/13/2018   Acquired trigger finger of right ring finger 05/29/2018   Hyperlipidemia 02/08/2018   History of ST elevation myocardial infarction (STEMI) 12/01/2017   Endometrial hyperplasia, simple 11/08/2017   Unilateral primary osteoarthritis, right knee 10/27/2017   Abdominal mass 08/15/2017   Hypertensive disorder 04/17/2016   CAD (coronary artery disease) 04/17/2016   Healthcare maintenance 08/17/2015   Thrombocytosis 03/10/2015   Tobacco abuse 12/02/2014   Cancer of overlapping sites of right female breast ([place]) 09/16/2014   Breast cancer of upper-outer quadrant of left female breast ([place]) 09/16/2014   Hot flashes due to tamoxifen 05/09/2014   Lymphedema of arm 11/08/2013   Chronic pain 03/31/2012   Hypokalemia  03/31/2012   Atherosclerosis of aorta (HCC)     Conditions to be addressed/monitored per PCP order:   chronic healthcare management needs,  HTN, tobacco use, CAD h/o beast cancer, chronic pain, lymphedema  Care Plan : General Plan of Care (Adult)  Updates made by Gayla Medicus, RN since 07/28/2021 12:00 AM     Problem: Health Promotion or Disease Self-Management (General Plan of Care)   Priority: Medium  Onset Date: 12/30/2020     Problem: Harm or Injury   Priority: Medium  Onset Date: 12/30/2020     Long-Range Goal: Harm or Injury Prevented   Start Date: 12/30/2020  Expected End Date: 10/01/2021  Recent Progress: On track  Priority: Medium  Note:   Current Barriers:  Patient with recent fall and fracture requiring surgery.   8/9//22: Patient left foot improved-wearing shoe and uses walker/cane as needed.  Ongoing lymphedema-stable, uses compression stockings prn. Uterine fibroids-has GYN F/U and U/S appointment scheduled.  Smoker-7 cigarettes a day.  Nurse Case Manager Clinical Goals Over the next 30 days, patient will attend all scheduled medical appointments: Update 07/28/21:  Follow up appointments scheduled with Dr. Amalia Hailey, GYN.  Interventions:  Inter-disciplinary care team collaboration (see longitudinal plan of care) Evaluation of current treatment plan  related to fracture and patient's adherence to plan as established by provider. Provided patient with 1-800-QUITNOW number-patient smokes 7 cigarettes a day and is trying to stop on her own-is not interested in medications right now. Reviewed medications with patient. Discussed plans with patient for ongoing care management follow up and provided patient with direct contact information for care management team Pharmacy referral for medication review. Update 07/01/21:  Patient met with Pharmacist and is following.  Patient Goals/Self-Care Activities Over the next 30 days, patient will:  -Attends all scheduled provider  appointments Calls provider office for new concerns or questions  Follow Up Plan: The Managed Medicaid care management team will reach out to the patient again over the next 30 days.  The patient has been provided with contact information for the Managed Medicaid care management team and has been advised to call with any health related questions or concerns.         Follow Up:  Patient agrees to Care Plan and Follow-up.  Plan: The Managed Medicaid care management team will reach out to the patient again over the next 30 days. and The patient has been provided with contact information for the Managed Medicaid care management team and has been advised to call with any health related questions or concerns.  Date/time of next scheduled RN care management/care coordination outreach: 08/28/21 at 0900.

## 2021-07-28 NOTE — Patient Instructions (Signed)
Hi [name], thank you for speaking with me today-I am glad you are doing well.  Have a nice day.  [name] was given information about Medicaid Managed Care team care coordination services as a part of their [place] Medicaid benefit. [name] verbally consented to engagement with the [facility] Managed Care team.   If you are experiencing a medical emergency, please call 911 or report to your local emergency department or urgent care.   If you have a non-emergency medical problem during routine business hours, please contact your provider's office and ask to speak with a nurse.   For questions related to your [facility], please call: [phone] or visit the homepage here: [email or web address]  If you would like to schedule transportation through your [facility], please call the following number at least 2 days in advance of your appointment: [phone].   Call the [place] at [phone], at any time, 24 hours a day, 7 days a week. If you are in danger or need immediate medical attention call 911.  If you would like help to quit smoking, call 1-800-QUIT-NOW 334-353-3612) OR Espaol: 1-855-Djelo-Ya (8-453-646-8032) o para ms informacin haga clic aqu or Text READY to 200-400 to register via text  [name] - following are the goals we discussed in your visit today:   Goals Addressed             This Visit's Progress    Protect My Health       Timeframe:  Long-Range Goal Priority:  Medium Start Date:    07/02/21                         Expected End Date: ongoing                     Follow Up Date:  08/28/21   - schedule appointment for flu shot - schedule appointment for vaccines needed due to my age or health - schedule recommended health tests (blood work, mammogram, colonoscopy, pap test) - schedule and keep  appointment for annual check-up    Why is this important?   Screening tests can find diseases early when they are easier to treat.  Your doctor or nurse will talk with you about which tests are important for you.  Getting shots for common diseases like the flu and shingles will help prevent them.     Notes:  Update 07/28/21:  Patient seen by PCP and [place], Dr. [name],  this month.  Has appointment for U/S 07/30/21 and follow up with GYN 9/14. Dr. Amalia Hailey 08/17/21.        Patient verbalizes understanding of instructions provided today.   The Managed Medicaid care management team will reach out to the patient again over the next 30 days.  The  Patient                                              has been provided with contact information for the Managed Medicaid care management team and has been advised to call with any health related questions or concerns.  Aida Raider RN, BSN [place]  Triad Curator - Managed Medicaid High Risk [phone].    Following is a copy of your  plan of care:  Patient Care Plan: General Plan of Care (Adult)     Problem Identified: Health Promotion or Disease Self-Management (General Plan of Care)   Priority: Medium  Onset Date: 12/30/2020     Problem Identified: Harm or Injury   Priority: Medium  Onset Date: 12/30/2020     Long-Range Goal: Harm or Injury Prevented   Start Date: 12/30/2020  Expected End Date: 10/01/2021  Recent Progress: On track  Priority: Medium  Note:   Current Barriers:  Patient with recent fall and fracture requiring surgery.   8/9//22: Patient left foot improved-wearing shoe and uses walker/cane as needed.  Ongoing lymphedema-stable, uses compression stockings prn. Uterine fibroids-has GYN F/U and U/S appointment scheduled.  Smoker-7 cigarettes a day.  Nurse Case Manager Clinical Goals Over the next 30 days, patient will attend all scheduled medical appointments: Update  07/28/21:  Follow up appointments scheduled with Dr. Amalia Hailey, GYN.  Interventions:  Inter-disciplinary care team collaboration (see longitudinal plan of care) Evaluation of current treatment plan related to fracture and patient's adherence to plan as established by provider. Provided patient with 1-800-QUITNOW number-patient smokes 7 cigarettes a day and is trying to stop on her own-is not interested in medications right now. Reviewed medications with patient. Discussed plans with patient for ongoing care management follow up and provided patient with direct contact information for care management team Pharmacy referral for medication review. Update 07/01/21:  Patient met with Pharmacist and is following.  Patient Goals/Self-Care Activities Over the next 30 days, patient will:  -Attends all scheduled provider appointments Calls provider office for new concerns or questions  Follow Up Plan: The Managed Medicaid care management team will reach out to the patient again over the next 30 days.  The patient has been provided with contact information for the Managed Medicaid care management team and has been advised to call with any health related questions or concerns.

## 2021-07-30 ENCOUNTER — Ambulatory Visit (HOSPITAL_BASED_OUTPATIENT_CLINIC_OR_DEPARTMENT_OTHER): Payer: Medicaid Other

## 2021-08-03 ENCOUNTER — Other Ambulatory Visit (HOSPITAL_BASED_OUTPATIENT_CLINIC_OR_DEPARTMENT_OTHER): Payer: Medicaid Other

## 2021-08-03 ENCOUNTER — Other Ambulatory Visit (HOSPITAL_COMMUNITY): Payer: Self-pay

## 2021-08-03 DIAGNOSIS — S8262XA Displaced fracture of lateral malleolus of left fibula, initial encounter for closed fracture: Secondary | ICD-10-CM | POA: Diagnosis not present

## 2021-08-06 ENCOUNTER — Ambulatory Visit (HOSPITAL_BASED_OUTPATIENT_CLINIC_OR_DEPARTMENT_OTHER)
Admission: RE | Admit: 2021-08-06 | Discharge: 2021-08-06 | Disposition: A | Discharge status: Home / Self Care | Payer: Medicaid Other | Source: Ambulatory Visit | Attending: Obstetrics & Gynecology | Admitting: Obstetrics & Gynecology

## 2021-08-06 ENCOUNTER — Other Ambulatory Visit (HOSPITAL_BASED_OUTPATIENT_CLINIC_OR_DEPARTMENT_OTHER): Payer: Self-pay | Admitting: Obstetrics & Gynecology

## 2021-08-06 ENCOUNTER — Ambulatory Visit (HOSPITAL_BASED_OUTPATIENT_CLINIC_OR_DEPARTMENT_OTHER): Admission: RE | Admit: 2021-08-06 | Payer: Medicaid Other | Source: Ambulatory Visit

## 2021-08-06 ENCOUNTER — Other Ambulatory Visit: Payer: Self-pay

## 2021-08-06 ENCOUNTER — Encounter (HOSPITAL_BASED_OUTPATIENT_CLINIC_OR_DEPARTMENT_OTHER): Payer: Self-pay

## 2021-08-06 DIAGNOSIS — I700 Atherosclerosis of aorta: Secondary | ICD-10-CM | POA: Diagnosis not present

## 2021-08-06 DIAGNOSIS — D219 Benign neoplasm of connective and other soft tissue, unspecified: Secondary | ICD-10-CM | POA: Insufficient documentation

## 2021-08-06 DIAGNOSIS — R140 Abdominal distension (gaseous): Secondary | ICD-10-CM | POA: Insufficient documentation

## 2021-08-06 DIAGNOSIS — R1084 Generalized abdominal pain: Secondary | ICD-10-CM | POA: Diagnosis present

## 2021-08-06 DIAGNOSIS — K5730 Diverticulosis of large intestine without perforation or abscess without bleeding: Secondary | ICD-10-CM | POA: Diagnosis not present

## 2021-08-06 DIAGNOSIS — D259 Leiomyoma of uterus, unspecified: Secondary | ICD-10-CM | POA: Diagnosis not present

## 2021-08-06 DIAGNOSIS — N852 Hypertrophy of uterus: Secondary | ICD-10-CM | POA: Diagnosis not present

## 2021-08-06 MED ORDER — IOHEXOL 300 MG/ML  SOLN
100.0000 mL | Freq: Once | INTRAMUSCULAR | Status: AC | PRN
  Administered 2021-08-06: 100 mL via INTRAVENOUS

## 2021-08-10 ENCOUNTER — Telehealth: Payer: Self-pay

## 2021-08-10 NOTE — Telephone Encounter (Signed)
[name] called and made aware that her fibroid seems stable. [name] also made aware that her endometrial stripe is thickened and she needs and endometrial biopsy.  [name] scheduled to come back in office on 09-02-21 to discuss management of fibroids with Dr. Ihor Dow and can do EMB at that visit. [name] RN

## 2021-08-17 ENCOUNTER — Ambulatory Visit: Payer: Medicaid Other | Admitting: Podiatry

## 2021-08-19 ENCOUNTER — Telehealth: Payer: Self-pay | Admitting: Hematology and Oncology

## 2021-08-19 NOTE — Telephone Encounter (Signed)
Scheduled appts called and confirmed appts with pt

## 2021-08-25 ENCOUNTER — Ambulatory Visit: Payer: Medicaid Other | Admitting: Family Medicine

## 2021-08-27 ENCOUNTER — Ambulatory Visit: Payer: Self-pay

## 2021-08-28 ENCOUNTER — Other Ambulatory Visit: Payer: Self-pay | Admitting: Obstetrics and Gynecology

## 2021-08-28 NOTE — Patient Instructions (Signed)
Hi Ms. Lusky, I am sorry I missed you today, I hope you are doing well- as a part of your Medicaid benefit, you are eligible for care management and care coordination services at no cost or copay. I was unable to reach you by phone today but would be happy to help you with your health related needs. Please feel free to call me at [phone].  A member of the Managed Medicaid care management team will reach out to you again over the next 7-14 days.   Aida Raider RN, BSN [place]  Triad Curator - Managed Medicaid High Risk [phone].

## 2021-08-28 NOTE — Patient Outreach (Signed)
Care Coordination  08/28/2021  [name] 04/29/1961 [id number]   Medicaid Managed Care   Unsuccessful Outreach Note  08/28/2021 Name: [name] MRN: [id number] DOB: 1961-03-08  Referred by: [name], MD Reason for referral : High Risk Managed Medicaid (Unsuccessful telephone outreach)   An unsuccessful telephone outreach was attempted today. The patient was referred to the case management team for assistance with care management and care coordination.   Follow Up Plan: The care management team will reach out to the patient again over the next 7-14 days.   Aida Raider RN, BSN [place]  Triad Curator - Managed Medicaid High Risk [phone]

## 2021-08-31 ENCOUNTER — Other Ambulatory Visit (HOSPITAL_COMMUNITY): Payer: Self-pay

## 2021-08-31 ENCOUNTER — Other Ambulatory Visit: Payer: Self-pay

## 2021-08-31 ENCOUNTER — Other Ambulatory Visit: Payer: Self-pay | Admitting: Oncology

## 2021-08-31 ENCOUNTER — Ambulatory Visit: Payer: Medicaid Other | Admitting: Podiatry

## 2021-08-31 ENCOUNTER — Other Ambulatory Visit: Payer: Self-pay | Admitting: Obstetrics and Gynecology

## 2021-08-31 DIAGNOSIS — C50412 Malignant neoplasm of upper-outer quadrant of left female breast: Secondary | ICD-10-CM

## 2021-08-31 MED ORDER — METHADONE HCL 5 MG PO TABS
15.0000 mg | ORAL_TABLET | Freq: Three times a day (TID) | ORAL | 0 refills | Status: DC | PRN
  Filled 2021-08-31: qty 270, 30d supply, fill #0

## 2021-08-31 NOTE — Patient Outreach (Signed)
Care Coordination  08/31/2021  [name] [birth year] [id number]  RNCM returned patient's phone call, no answer-left message.  Aida Raider RN, BSN [place]  Triad Curator - Managed Medicaid High Risk [phone].

## 2021-09-02 ENCOUNTER — Ambulatory Visit: Payer: Medicaid Other | Admitting: Obstetrics & Gynecology

## 2021-09-02 ENCOUNTER — Other Ambulatory Visit (HOSPITAL_COMMUNITY): Payer: Self-pay

## 2021-09-05 ENCOUNTER — Other Ambulatory Visit (HOSPITAL_COMMUNITY): Payer: Self-pay

## 2021-09-08 ENCOUNTER — Other Ambulatory Visit: Payer: Self-pay

## 2021-09-08 ENCOUNTER — Other Ambulatory Visit: Payer: Self-pay | Admitting: Obstetrics and Gynecology

## 2021-09-08 NOTE — Patient Outreach (Signed)
Care Coordination  09/08/2021  [name] 10-24-1961 [id number]  RNCM returned patient's phone call.  Patient states she is in need of housing and vehicle resources as she and her significant other are "going their separate ways"  Patient states she is safe. BSW referral made for resources.  Patient very appreciative.  Aida Raider RN, BSN [place]  Triad Curator - Managed Medicaid High Risk [phone].

## 2021-09-09 ENCOUNTER — Other Ambulatory Visit: Payer: Self-pay

## 2021-09-09 NOTE — Patient Outreach (Signed)
Medicaid Managed Care Social Work Note  09/09/2021 Name:  [name] MRN:  [id number] DOB:  12-Jul-1961  [name] is an 60 y.o. year old female who is a primary patient of [name], MD.  The Medicaid Managed Care Coordination team was consulted for assistance with:  Community Resources   Ms. Killings was given information about Medicaid Managed Care Coordination team services today. [name] agreed to services and verbal consent obtained.  Engaged with patient  for by telephone forfollow up visit in response to referral for case management and/or care coordination services.   Assessments/Interventions:  Review of past medical history, allergies, medications, health status, including review of consultants reports, laboratory and other test data, was performed as part of comprehensive evaluation and provision of chronic care management services.  SDOH: (Social Determinant of Health) assessments and interventions performed: BSW spoke with patient about housing. Patient stated she did look at a condo but it was still dirty. Patient is looking to move asap into something no more than 1000 per month. BSW will research and provide patient with a list of places on 9/23   Advanced Directives Status:  Not addressed in this encounter.  Care Plan                 No Known Allergies  Medications Reviewed Today     Reviewed by Gayla Medicus, RN (Registered Nurse) on 09/08/21 at 1128  Med List Status: <None>   Medication Order Taking? Sig Documenting Provider Last Dose Status Informant  acetaminophen (TYLENOL) 500 MG tablet [id number] No Take 2 tablets (1,000 mg total) by mouth every 8 (eight) hours as needed. Carlyle Dolly, MD Taking Active Self  allopurinol (ZYLOPRIM) 300 MG tablet [id number] No TAKE 1 TABLET BY MOUTH EVERY DAY [name], MD Taking Active   amLODipine (NORVASC) 5 MG tablet [id number] No Take 1 tablet (5 mg total) by mouth daily. [name], MD  Taking Active   ascorbic acid (VITAMIN C) 500 MG tablet [id number] No Take 500 mg by mouth daily. [provider] Taking Active Self  aspirin EC 81 MG tablet [id number] No Take 81 mg by mouth daily. [provider] Taking Active Self  atorvastatin (LIPITOR) 40 MG tablet [id number] No Take 1 tablet (40 mg total) by mouth daily at 6 PM.  Patient taking differently: Take 40 mg by mouth daily.   [name], MD Taking Active   camphor-menthol [place]) lotion [id number] No Apply topically as needed for itching.  Patient not taking: Reported on 07/08/2021   Eugenie Filler, MD Not Taking Active Self  ELDERBERRY PO [id number] No Take 1,000 mg by mouth daily. [provider] Taking Active Self  Discontinued 03/10/12 1404 (Error)   Ginkgo Biloba 120 MG TABS [id number] No Take 120 mg by mouth daily. [provider] Taking Active Self  isosorbide mononitrate (IMDUR) 60 MG 24 hr tablet [id number] No Take 1 tablet (60 mg total) by mouth at bedtime.  Patient not taking: No sig reported   [name], MD Not Taking Active   KLOR-CON M20 20 MEQ tablet [id number] No TAKE 1 TABLET (20 MEQ TOTAL) BY MOUTH 3 (THREE) TIMES DAILY. [name], MD Taking Active   methadone (DOLOPHINE) 5 MG tablet [id number]  Take 3 tablets (15 mg total) by mouth every 8 (eight) hours as needed. Magrinat, Virgie Dad, MD  Active   metoprolol succinate (TOPROL-XL) 50 MG 24 hr tablet [id number] No Take 2 tablets (100 mg total)  by mouth daily. Take with or immediately following a meal. [name], MD Taking Active   nitroGLYCERIN (NITROSTAT) 0.4 MG SL tablet [id number] No Place 1 tablet (0.4 mg total) under the tongue every 5 (five) minutes as needed for chest pain. Richardson Dopp T, PA-C Taking Active Self  OVER THE COUNTER MEDICATION [id number] No Zinc 30 mg daily as needed [provider] Taking Active   OVER THE COUNTER MEDICATION [id number] No Ear drying drops - as needed [provider]  Taking Active   oxyCODONE (OXY IR/ROXICODONE) 5 MG immediate release tablet [id number] No Take 1-2 tablets (5-10 mg total) by mouth every 6 (six) hours as needed for severe pain.  Patient not taking: No sig reported   Stark Klein, MD Not Taking Active   tamoxifen (NOLVADEX) 20 MG tablet [id number] No Take 1 tablet (20 mg total) by mouth daily. Magrinat, Virgie Dad, MD Taking Active             Patient Active Problem List   Diagnosis Date Noted   COVID-19 vaccine administered 07/27/2021   Fall 07/27/2021   Prediabetes 01/01/2021   Concern about ear disease without diagnosis 01/01/2021   Neck mass 11/05/2020   Arthritis of right knee 06/11/2020   Acquired absence of both breasts 04/01/2020   Left facial swelling 02/22/2020   Left arm cellulitis 09/28/2019   Cellulitis 09/28/2019   Cellulitis of left arm    Bilateral leg edema 06/21/2019   Dysfunctional uterine bleeding 04/30/2019   Right leg pain 09/13/2018   Acquired trigger finger of right ring finger 05/29/2018   Hyperlipidemia 02/08/2018   History of ST elevation myocardial infarction (STEMI) 12/01/2017   Endometrial hyperplasia, simple 11/08/2017   Unilateral primary osteoarthritis, right knee 10/27/2017   Abdominal mass 08/15/2017   Hypertensive disorder 04/17/2016   CAD (coronary artery disease) 04/17/2016   Healthcare maintenance 08/17/2015   Thrombocytosis 03/10/2015   Tobacco abuse 12/02/2014   Cancer of overlapping sites of right female breast ([place]) 09/16/2014   Breast cancer of upper-outer quadrant of left female breast ([place]) 09/16/2014   Hot flashes due to tamoxifen 05/09/2014   Lymphedema of arm 11/08/2013   Chronic pain 03/31/2012   Hypokalemia 03/31/2012   Atherosclerosis of aorta (HCC)     Conditions to be addressed/monitored per PCP order:   housing  There are no care plans that you recently modified to display for this patient.   Follow up:  Patient agrees to Care Plan and Follow-up.  Plan: The  Managed Medicaid care management team will reach out to the patient again over the next 2 days.  Date/time of next scheduled Social Work care management/care coordination outreach:  09/11/21  Mickel Fuchs, Arita Miss, [place] Medicaid Team  [phone]

## 2021-09-09 NOTE — Patient Instructions (Signed)
Visit Information  [name] was given information about Medicaid Managed Care team care coordination services as a part of their [place] Medicaid benefit. [name] verbally consented to engagement with the [facility] Managed Care team.   If you are experiencing a medical emergency, please call 911 or report to your local emergency department or urgent care.   If you have a non-emergency medical problem during routine business hours, please contact your provider's office and ask to speak with a nurse.   For questions related to your [facility], please call: [phone] or visit the homepage here: [email or web address]  If you would like to schedule transportation through your [facility], please call the following number at least 2 days in advance of your appointment: [phone].   Call the [place] at [phone], at any time, 24 hours a day, 7 days a week. If you are in danger or need immediate medical attention call 911.  If you would like help to quit smoking, call 1-800-QUIT-NOW (743)874-3516) OR Espaol: 1-855-Djelo-Ya (7-622-633-3545) o para ms informacin haga clic aqu or Text READY to 200-400 to register via text  [name] - following are the goals we discussed in your visit today:   Goals Addressed   None      Social Worker will follow up .   [name], BSW, [place] Managed Medicaid Team  [phone]   Following is a copy of your plan of care:

## 2021-09-11 ENCOUNTER — Other Ambulatory Visit: Payer: Self-pay

## 2021-09-11 NOTE — Patient Outreach (Signed)
Medicaid Managed Care Social Work Note  09/11/2021 Name:  [name] MRN:  [id number] DOB:  [birth year]  [name] is an 60 y.o. year old female who is a primary patient of [name], MD.  The Medicaid Managed Care Coordination team was consulted for assistance with:   housing  Ms. Ormand was given information about Medicaid Managed Care Coordination team services today. [name] agreed to services and verbal consent obtained.  Engaged with patient  for by telephone forfollow up visit in response to referral for case management and/or care coordination services.   Assessments/Interventions:  Review of past medical history, allergies, medications, health status, including review of consultants reports, laboratory and other test data, was performed as part of comprehensive evaluation and provision of chronic care management services.  SDOH: (Social Determinant of Health) assessments and interventions performed:  BSW completed research and was able to locate and provide patient with information for [employer], [employer], [employer], [place]. BSW also provided patient with the telephone number for [facility] for car donations.   Advanced Directives Status:  Not addressed in this encounter.  Care Plan                 No Known Allergies  Medications Reviewed Today     Reviewed by Gayla Medicus, RN (Registered Nurse) on 09/08/21 at 1128  Med List Status: <None>   Medication Order Taking? Sig Documenting Provider Last Dose Status Informant  acetaminophen (TYLENOL) 500 MG tablet [id number] No Take 2 tablets (1,000 mg total) by mouth every 8 (eight) hours as needed. Carlyle Dolly, MD Taking Active Self  allopurinol (ZYLOPRIM) 300 MG tablet [id number] No TAKE 1 TABLET BY MOUTH EVERY DAY [name], MD Taking Active   amLODipine (NORVASC) 5 MG tablet [id number] No Take 1 tablet (5 mg total) by mouth daily. [name], MD Taking Active   ascorbic acid (VITAMIN C) 500 MG tablet [id number] No Take 500 mg by mouth daily. [provider] Taking Active Self  aspirin EC 81 MG tablet [id number] No Take 81 mg by mouth daily. [provider] Taking Active Self  atorvastatin (LIPITOR) 40 MG tablet [id number] No Take 1 tablet (40 mg total) by mouth daily at 6 PM.  Patient taking differently: Take 40 mg by mouth daily.   [name], MD Taking Active   camphor-menthol [facility]) lotion [id number] No Apply topically as needed for itching.  Patient not taking: Reported on 07/08/2021   Eugenie Filler, MD Not Taking Active Self  ELDERBERRY PO [id number] No Take 1,000 mg by mouth daily. [provider] Taking Active Self  Discontinued 03/10/12 1404 (Error)   Ginkgo Biloba 120 MG TABS [id number] No Take 120 mg by mouth daily. [provider] Taking Active Self  isosorbide mononitrate (IMDUR) 60 MG 24 hr tablet [id number] No Take 1 tablet (60 mg total) by mouth at bedtime.  Patient not taking: No sig reported   [name], MD Not Taking Active   KLOR-CON M20 20 MEQ tablet [id number] No TAKE 1 TABLET (20 MEQ TOTAL) BY MOUTH 3 (THREE) TIMES DAILY. [name], MD Taking Active   methadone (DOLOPHINE) 5 MG tablet [id number]  Take 3 tablets (15 mg total) by mouth every 8 (eight) hours as needed. Magrinat, Virgie Dad, MD  Active   metoprolol succinate (TOPROL-XL) 50 MG 24 hr tablet [id number] No Take 2 tablets (100 mg total) by mouth daily. Take with or immediately  following a meal. [name], MD Taking Active   nitroGLYCERIN (NITROSTAT) 0.4 MG SL tablet [id number] No Place 1 tablet (0.4 mg total) under the tongue every 5 (five) minutes as needed for chest pain. Richardson Dopp T, PA-C Taking Active Self  OVER THE COUNTER MEDICATION [id number] No Zinc 30 mg daily as needed [provider] Taking Active   OVER THE COUNTER MEDICATION [id number] No Ear drying drops - as needed [provider] Taking Active   oxyCODONE (OXY IR/ROXICODONE) 5 MG immediate release tablet [id number] No Take 1-2 tablets (5-10 mg total) by mouth every 6 (six) hours as needed for severe pain.  Patient not taking: No sig reported   Stark Klein, MD Not Taking Active   tamoxifen (NOLVADEX) 20 MG tablet [id number] No Take 1 tablet (20 mg total) by mouth daily. Magrinat, Virgie Dad, MD Taking Active             Patient Active Problem List   Diagnosis Date Noted   COVID-19 vaccine administered 07/27/2021   Fall 07/27/2021   Prediabetes 01/01/2021   Concern about ear disease without diagnosis 01/01/2021   Neck mass 11/05/2020   Arthritis of right knee 06/11/2020   Acquired absence of both breasts 04/01/2020   Left facial swelling 02/22/2020   Left arm cellulitis 09/28/2019   Cellulitis 09/28/2019   Cellulitis of left arm    Bilateral leg edema 06/21/2019   Dysfunctional uterine bleeding 04/30/2019   Right leg pain 09/13/2018   Acquired trigger finger of right ring finger 05/29/2018   Hyperlipidemia 02/08/2018   History of ST elevation myocardial infarction (STEMI) 12/01/2017   Endometrial hyperplasia, simple 11/08/2017   Unilateral primary osteoarthritis, right knee 10/27/2017   Abdominal mass 08/15/2017   Hypertensive disorder 04/17/2016   CAD (coronary artery disease) 04/17/2016   Healthcare maintenance 08/17/2015   Thrombocytosis 03/10/2015   Tobacco abuse 12/02/2014   Cancer of overlapping sites of right female breast ([place]) 09/16/2014   Breast cancer of upper-outer quadrant of left female breast ([place]) 09/16/2014   Hot flashes due to tamoxifen 05/09/2014   Lymphedema of arm 11/08/2013   Chronic pain 03/31/2012   Hypokalemia 03/31/2012   Atherosclerosis of aorta (HCC)     Conditions to be addressed/monitored per PCP order:   housing  There are no care plans that you recently modified to display for this patient.   Follow up:  Patient agrees to Care Plan and  Follow-up.  Plan: The Managed Medicaid care management team will reach out to the patient again over the next 30 days.  Date/time of next scheduled Social Work care management/care coordination outreach:  10/12/21  Mickel Fuchs, Arita Miss, [place] Managed Medicaid Team  [phone]

## 2021-09-11 NOTE — Patient Instructions (Signed)
Visit Information  Ms. Huffaker was given information about Medicaid Managed Care team care coordination services as a part of their [place] Medicaid benefit. [name] verbally consented to engagement with the [facility] Managed Care team.   If you are experiencing a medical emergency, please call 911 or report to your local emergency department or urgent care.   If you have a non-emergency medical problem during routine business hours, please contact your provider's office and ask to speak with a nurse.   For questions related to your [facility], please call: [phone] or visit the homepage here: [email or web address]  If you would like to schedule transportation through your [facility], please call the following number at least 2 days in advance of your appointment: [phone].   Call the [place] at [phone], at any time, 24 hours a day, 7 days a week. If you are in danger or need immediate medical attention call 911.  If you would like help to quit smoking, call 1-800-QUIT-NOW (787) 055-5031) OR Espaol: 1-855-Djelo-Ya (2-549-826-4158) o para ms informacin haga clic aqu or Text READY to 200-400 to register via text  Ms. Gladhill - following are the goals we discussed in your visit today:   Goals Addressed   None     Social Worker will follow up with patient in 30 days .   Mickel Fuchs, BSW, [place] Managed Medicaid Team  [phone]   Following is a copy of your plan of care:

## 2021-09-21 ENCOUNTER — Encounter (HOSPITAL_COMMUNITY): Payer: Self-pay | Admitting: Radiology

## 2021-09-21 ENCOUNTER — Other Ambulatory Visit: Payer: Self-pay | Admitting: Obstetrics and Gynecology

## 2021-09-21 ENCOUNTER — Other Ambulatory Visit: Payer: Self-pay

## 2021-09-21 NOTE — Patient Outreach (Signed)
Care Coordination  09/21/2021  [name] Aug 15, 1961 [id number]  [name] was referred to the [place] team for assistance with care coordination and care management services. Care coordination/care management services as part of the Medicaid benefit was offered to the patient today. The patient declined further assistance.  Plan: The Medicaid Managed Care High Risk team is available at any time in the future to assist with care coordination/care management services upon referral.   Aida Raider RN, [place] Management Coordinator - Managed [place] High Risk [phone].

## 2021-09-28 ENCOUNTER — Other Ambulatory Visit (HOSPITAL_COMMUNITY): Payer: Self-pay

## 2021-09-28 ENCOUNTER — Ambulatory Visit: Payer: Medicaid Other | Admitting: Podiatry

## 2021-09-28 ENCOUNTER — Other Ambulatory Visit: Payer: Self-pay | Admitting: Oncology

## 2021-09-28 DIAGNOSIS — C50412 Malignant neoplasm of upper-outer quadrant of left female breast: Secondary | ICD-10-CM

## 2021-09-28 MED ORDER — METHADONE HCL 5 MG PO TABS
15.0000 mg | ORAL_TABLET | Freq: Three times a day (TID) | ORAL | 0 refills | Status: DC | PRN
  Filled 2021-09-28: qty 270, 30d supply, fill #0

## 2021-09-29 ENCOUNTER — Other Ambulatory Visit: Payer: Self-pay | Admitting: *Deleted

## 2021-09-29 ENCOUNTER — Other Ambulatory Visit (HOSPITAL_COMMUNITY): Payer: Self-pay

## 2021-09-29 DIAGNOSIS — I890 Lymphedema, not elsewhere classified: Secondary | ICD-10-CM

## 2021-09-29 DIAGNOSIS — C50412 Malignant neoplasm of upper-outer quadrant of left female breast: Secondary | ICD-10-CM

## 2021-09-30 ENCOUNTER — Other Ambulatory Visit (HOSPITAL_COMMUNITY): Payer: Self-pay

## 2021-09-30 ENCOUNTER — Telehealth: Payer: Self-pay

## 2021-09-30 ENCOUNTER — Ambulatory Visit: Payer: Self-pay

## 2021-09-30 ENCOUNTER — Ambulatory Visit: Payer: Medicaid Other

## 2021-09-30 NOTE — Telephone Encounter (Signed)
Patient calls nurse line regarding need for PCS. Patient reports that she has been without services for approx. 1 month.   Will place PCS form in provider box.   Talbot Grumbling, RN

## 2021-10-01 NOTE — Telephone Encounter (Signed)
[place]**

## 2021-10-02 NOTE — Telephone Encounter (Signed)
The PCS form was faxed on 10/4.  I found it in the recently faxed files.  I will resend it today.  Spoke with the patient and informed her of this.

## 2021-10-05 ENCOUNTER — Ambulatory Visit (INDEPENDENT_AMBULATORY_CARE_PROVIDER_SITE_OTHER): Payer: Medicaid Other | Admitting: Obstetrics & Gynecology

## 2021-10-05 ENCOUNTER — Encounter: Payer: Self-pay | Admitting: General Practice

## 2021-10-05 ENCOUNTER — Other Ambulatory Visit: Payer: Self-pay

## 2021-10-05 ENCOUNTER — Other Ambulatory Visit (HOSPITAL_COMMUNITY): Payer: Self-pay

## 2021-10-05 ENCOUNTER — Other Ambulatory Visit (HOSPITAL_COMMUNITY)
Admission: RE | Admit: 2021-10-05 | Discharge: 2021-10-05 | Disposition: A | Discharge status: Home / Self Care | Payer: Medicaid Other | Source: Ambulatory Visit | Attending: Obstetrics & Gynecology | Admitting: Obstetrics & Gynecology

## 2021-10-05 ENCOUNTER — Encounter: Payer: Self-pay | Admitting: Obstetrics & Gynecology

## 2021-10-05 VITALS — BP 136/84 | HR 62 | Wt 239.0 lb

## 2021-10-05 DIAGNOSIS — R9389 Abnormal findings on diagnostic imaging of other specified body structures: Secondary | ICD-10-CM | POA: Diagnosis present

## 2021-10-05 DIAGNOSIS — N8501 Benign endometrial hyperplasia: Secondary | ICD-10-CM

## 2021-10-05 DIAGNOSIS — Z7689 Persons encountering health services in other specified circumstances: Secondary | ICD-10-CM

## 2021-10-05 DIAGNOSIS — Z6839 Body mass index (BMI) 39.0-39.9, adult: Secondary | ICD-10-CM | POA: Diagnosis not present

## 2021-10-05 DIAGNOSIS — N950 Postmenopausal bleeding: Secondary | ICD-10-CM

## 2021-10-05 NOTE — Progress Notes (Signed)
History:  60 y.o. G0P0000 here today for f/u of uterine fibroids. Pt denies bleeding but, feels that her abd is increasing in size. She feels that her weight is going up. She prev had simple hyperplasia on her endo bx.  Pt reports missed visit due to other personal and medical conflicts.   The following portions of the patient's history were reviewed and updated as appropriate: allergies, current medications, past family history, past medical history, past social history, past surgical history and problem list.  Review of Systems:  Pertinent items are noted in HPI.    Objective:  Physical Exam Blood pressure 136/84, pulse 62, weight 239 lb (108.4 kg), last menstrual period 08/07/2011.  CONSTITUTIONAL: Well-developed, well-nourished female in no acute distress.  HENT:  Normocephalic, atraumatic EYES: Conjunctivae and EOM are normal. No scleral icterus.  NECK: Normal range of motion SKIN: Skin is warm and dry. No rash noted. Not diaphoretic.No pallor. [place]: Alert and oriented to person, place, and time. Normal coordination.  Abd: obese, soft, nontender and nondistended. There is no palpable mass but this is difficult to ascertain due to body habitus.  Pelvic: Normal appearing external genitalia; normal appearing vaginal mucosa and cervix.  Normal discharge.  Enlarged uterus, no other palpable masses, no uterine or adnexal tenderness  .ass The indications for endometrial biopsy were reviewed.   Risks of the biopsy including cramping, bleeding, infection, uterine perforation, inadequate specimen and need for additional procedures  were discussed. The patient states she understands and agrees to undergo procedure today. Consent was signed. Time out was performed. Urine HCG was negative. A sterile speculum was placed in the patient's vagina and the cervix was prepped with Betadine. A single-toothed tenaculum was placed on the anterior lip of the cervix to stabilize it. The 3 mm pipelle was  introduced into the endometrial cavity without difficulty to a depth of 11cm, and a moderate amount of tissue was obtained and sent to pathology. The instruments were removed from the patient's vagina. Minimal bleeding from the cervix was noted. The patient tolerated the procedure well. Routine post-procedure instructions were given to the patient. The patient will follow up to review the results and for further management.      Labs and Imaging 10/31/2018 Diagnosis Endometrium, biopsy - PROLIFERATIVE PATTERN ENDOMETRIUM WITH FOCAL GLANDULAR CROWDING AND EXTENSIVE SQUAMOUS METAPLASIA - BENIGN SQUAMOUS EPITHELIUM - NO MALIGNANCY IDENTIFIED  08/06/2021 CLINICAL DATA:  Fibroids   EXAM: TRANSABDOMINAL ULTRASOUND OF PELVIS   TECHNIQUE: Transabdominal ultrasound examination of the pelvis was performed including evaluation of the uterus, ovaries, adnexal regions, and pelvic cul-de-sac.   COMPARISON:  CT today   FINDINGS: Uterus   Measurements: 14.2 x 10.0 x 12.7 cm = volume: 930 mL. Multiple fibroids. Three largest fibroids measure up to 4.1 cm off the fundus, 3.8 cm centrally, and 5.6 cm in the left posterior body.   Endometrium   Thickness: 13 mm in thickness.  No focal abnormality visualized.   Right ovary   Measurements: 4.5 x 2.4 x 1.8 cm = volume: 9.9 mL. Normal appearance/no adnexal mass.   Left ovary   Measurements: 2.6 x 2.0 x 1.8 cm = volume: 4.9 mL. Normal appearance/no adnexal mass.   Other findings:  No abnormal free fluid.   IMPRESSION: Enlarged uterus with multiple fibroids.   Endometrium 13 mm in thickness. Endometrial thickness is considered abnormal for an asymptomatic post-menopausal female. Endometrial sampling should be considered to exclude carcinoma.    08/06/2021 CLINICAL DATA:  Abdominal distention   EXAM:  CT ABDOMEN AND PELVIS WITH CONTRAST   TECHNIQUE: Multidetector CT imaging of the abdomen and pelvis was performed using the standard  protocol following bolus administration of intravenous contrast.   CONTRAST:  161mL OMNIPAQUE IOHEXOL 300 MG/ML  SOLN   COMPARISON:  None.   FINDINGS: Lower chest: Coronary artery calcifications.  No acute abnormality.   Hepatobiliary: No focal hepatic abnormality. Gallbladder unremarkable.   Pancreas: No focal abnormality or ductal dilatation.   Spleen: No focal abnormality.  Normal size.   Adrenals/Urinary Tract: No adrenal abnormality. No focal renal abnormality. No stones or hydronephrosis. Urinary bladder is unremarkable.   Stomach/Bowel: Normal appendix. Left colonic diverticulosis. No active diverticulitis. Stomach and small bowel decompressed, unremarkable.   Vascular/Lymphatic: Aortic atherosclerosis. No evidence of aneurysm or adenopathy.   Reproductive: Enlarged uterus with multiple fibroids. No adnexal mass.   Other: No free fluid or free air.   Musculoskeletal: No acute bony abnormality.   IMPRESSION: Enlarged fibroid uterus.   Left colonic diverticulosis.  No active diverticulitis.   Aortic atherosclerosis. Assessment & Plan:  Diagnoses and all orders for this visit:  thickened endometrium -     Surgical pathology  BMI 39.0-39.9,adult -     Amb Ref to Medical Weight Management  Encounter for weight management -     Amb Ref to Medical Weight Management   Will f/u on endo bx and determine management of hyperplasia based on results. D/w pt that I am not worried about her fibroids and we discussed the history of fibroids post menopause.   Marsean Elkhatib L. Harraway-Smith, M.D., Cherlynn June

## 2021-10-06 LAB — SURGICAL PATHOLOGY

## 2021-10-08 NOTE — Telephone Encounter (Signed)
Patient called on 3 way with her [facility] representative stating that they still hadn't received her forms for PCS. I refaxed it to [phone] per [facility].  They are aware that this was going to be refaxed today.  Abdiaziz Klahn,CMA

## 2021-10-12 ENCOUNTER — Other Ambulatory Visit: Payer: Self-pay

## 2021-10-12 ENCOUNTER — Telehealth: Payer: Self-pay | Admitting: Obstetrics & Gynecology

## 2021-10-12 NOTE — Telephone Encounter (Signed)
TC to pt and notified of results of endo bx. We discussed tx options. Pt agrees to go to [place] for hysterectomy.   All questions answered. Will make referral on today.  Farryn Linares L. Harraway-Smith, M.D., Cherlynn June

## 2021-10-12 NOTE — Patient Outreach (Signed)
Medicaid Managed Care Social Work Note  10/12/2021 Name:  [name] MRN:  [id number] DOB:  01-01-1961  [name] is an 60 y.o. year old female who is a primary patient of [name], MD.  The Medicaid Managed Care Coordination team was consulted for assistance with:  Community Resources   Ms. Hatcher was given information about Medicaid Managed Care Coordination team services today. [name] agreed to services and verbal consent obtained.  Engaged with patient  for by telephone forfollow up visit in response to referral for case management and/or care coordination services.   Assessments/Interventions:  Review of past medical history, allergies, medications, health status, including review of consultants reports, laboratory and other test data, was performed as part of comprehensive evaluation and provision of chronic care management services.  SDOH: (Social Determinant of Health) assessments and interventions performed: BSW completed follow up with patient. Patient stated she was okay and was meeting with a Art gallery manager on tomorrow. No resources or services needed at this time. Patient did inform RNCM that MM services were no longer needed.   Advanced Directives Status:  Not addressed in this encounter.  Care Plan                 No Known Allergies  Medications Reviewed Today     Reviewed by Lavonia Drafts, MD (Physician) on 10/05/21 at 1106  Med List Status: <None>   Medication Order Taking? Sig Documenting Provider Last Dose Status Informant  acetaminophen (TYLENOL) 500 MG tablet [id number] Yes Take 2 tablets (1,000 mg total) by mouth every 8 (eight) hours as needed. Carlyle Dolly, MD Taking Active Self  allopurinol (ZYLOPRIM) 300 MG tablet [id number] Yes TAKE 1 TABLET BY MOUTH EVERY DAY [name], MD Taking Active   amLODipine (NORVASC) 5 MG tablet [id number] Yes Take 1 tablet (5 mg total) by mouth daily. [name], MD Taking Active    ascorbic acid (VITAMIN C) 500 MG tablet [id number] Yes Take 500 mg by mouth daily. [provider] Taking Active Self  aspirin EC 81 MG tablet [id number] Yes Take 81 mg by mouth daily. [provider] Taking Active Self  atorvastatin (LIPITOR) 40 MG tablet [id number] Yes Take 1 tablet (40 mg total) by mouth daily at 6 PM.  Patient taking differently: Take 40 mg by mouth daily.   [name], MD Taking Active   camphor-menthol [facility]) lotion [id number] Yes Apply topically as needed for itching. Eugenie Filler, MD Taking Active   ELDERBERRY PO [id number] No Take 1,000 mg by mouth daily.  Patient not taking: Reported on 10/05/2021   [provider] Not Taking Active Self    Discontinued 03/10/12 1404 (Error)   Ginkgo Biloba 120 MG TABS [id number] No Take 120 mg by mouth daily.  Patient not taking: Reported on 10/05/2021   [provider] Not Taking Active Self  isosorbide mononitrate (IMDUR) 60 MG 24 hr tablet [id number] Yes Take 1 tablet (60 mg total) by mouth at bedtime. [name], MD Taking Active   KLOR-CON M20 20 MEQ tablet [id number] Yes TAKE 1 TABLET (20 MEQ TOTAL) BY MOUTH 3 (THREE) TIMES DAILY. [name], MD Taking Active   methadone (DOLOPHINE) 5 MG tablet [id number] Yes Take 3 tablets (15 mg total) by mouth every 8 (eight) hours as needed. Magrinat, Virgie Dad, MD Taking Active   metoprolol succinate (TOPROL-XL) 50 MG 24 hr tablet [id number] Yes Take 2 tablets (100 mg total) by mouth daily. Take with or immediately  following a meal. [name], MD Taking Active   nitroGLYCERIN (NITROSTAT) 0.4 MG SL tablet [id number] Yes Place 1 tablet (0.4 mg total) under the tongue every 5 (five) minutes as needed for chest pain. Richardson Dopp T, PA-C Taking Active Self  OVER THE COUNTER MEDICATION [id number] Yes Zinc 30 mg daily as needed [provider] Taking Active   OVER THE COUNTER MEDICATION [id number] Yes Ear drying drops - as needed [provider] Taking Active   oxyCODONE (OXY IR/ROXICODONE) 5 MG immediate release tablet [id number] No Take 1-2 tablets (5-10 mg total) by mouth every 6 (six) hours as needed for severe pain.  Patient not taking: No sig reported   Stark Klein, MD Not Taking Active   tamoxifen (NOLVADEX) 20 MG tablet [id number] Yes Take 1 tablet (20 mg total) by mouth daily. Magrinat, Virgie Dad, MD Taking Active             Patient Active Problem List   Diagnosis Date Noted   COVID-19 vaccine administered 07/27/2021   Fall 07/27/2021   Prediabetes 01/01/2021   Concern about ear disease without diagnosis 01/01/2021   Neck mass 11/05/2020   Arthritis of right knee 06/11/2020   Acquired absence of both breasts 04/01/2020   Left facial swelling 02/22/2020   Left arm cellulitis 09/28/2019   Cellulitis 09/28/2019   Cellulitis of left arm    Bilateral leg edema 06/21/2019   Dysfunctional uterine bleeding 04/30/2019   Right leg pain 09/13/2018   Acquired trigger finger of right ring finger 05/29/2018   Hyperlipidemia 02/08/2018   History of ST elevation myocardial infarction (STEMI) 12/01/2017   Endometrial hyperplasia, simple 11/08/2017   Unilateral primary osteoarthritis, right knee 10/27/2017   Abdominal mass 08/15/2017   Hypertensive disorder 04/17/2016   CAD (coronary artery disease) 04/17/2016   Healthcare maintenance 08/17/2015   Thrombocytosis 03/10/2015   Tobacco abuse 12/02/2014   Cancer of overlapping sites of right female breast ([place]) 09/16/2014   Breast cancer of upper-outer quadrant of left female breast ([place]) 09/16/2014   Hot flashes due to tamoxifen 05/09/2014   Lymphedema of arm 11/08/2013   Chronic pain 03/31/2012   Hypokalemia 03/31/2012   Atherosclerosis of aorta (HCC)     Conditions to be addressed/monitored per PCP order:   N/A  There are no care plans that you recently modified to display for this patient.   Follow up:  Patient requests no follow-up at this  time.  Plan: The  Patient has been provided with contact information for the Managed Medicaid care management team and has been advised to call with any health related questions or concerns.    Mickel Fuchs, BSW, [place] Managed Medicaid Team  [phone]

## 2021-10-12 NOTE — Patient Instructions (Signed)
Visit Information  [name] was given information about Medicaid Managed Care team care coordination services as a part of their [place] Medicaid benefit. [name] verbally consented to engagement with the [facility] Managed Care team.   If you are experiencing a medical emergency, please call 911 or report to your local emergency department or urgent care.   If you have a non-emergency medical problem during routine business hours, please contact your provider's office and ask to speak with a nurse.   For questions related to your [facility], please call: [phone] or visit the homepage here: [email or web address]  If you would like to schedule transportation through your [facility], please call the following number at least 2 days in advance of your appointment: [phone].   Call the [place] at [phone], at any time, 24 hours a day, 7 days a week. If you are in danger or need immediate medical attention call 911.  If you would like help to quit smoking, call 1-800-QUIT-NOW 315-048-8768) OR Espaol: 1-855-Djelo-Ya (1-027-253-6644) o para ms informacin haga clic aqu or Text READY to 200-400 to register via text  Ms. Kronick - following are the goals we discussed in your visit today:   Goals Addressed   None       The  Patient                                              has been provided with contact information for the Managed Medicaid care management team and has been advised to call with any health related questions or concerns.   Mickel Fuchs, BSW, [place] Managed Medicaid Team  [phone]   Following is a copy of your plan of care:

## 2021-10-13 ENCOUNTER — Other Ambulatory Visit: Payer: Self-pay | Admitting: *Deleted

## 2021-10-13 ENCOUNTER — Telehealth: Payer: Self-pay | Admitting: *Deleted

## 2021-10-13 NOTE — Telephone Encounter (Signed)
Spoke with the patient and scheduled a new patient appt for 11/7 at 11:15 am with Dr Berline Lopes. Patient aware of location and phone number.

## 2021-10-18 ENCOUNTER — Other Ambulatory Visit: Payer: Self-pay | Admitting: Family Medicine

## 2021-10-19 ENCOUNTER — Ambulatory Visit: Payer: Medicaid Other | Admitting: Podiatry

## 2021-10-19 ENCOUNTER — Other Ambulatory Visit: Payer: Self-pay | Admitting: Family Medicine

## 2021-10-19 ENCOUNTER — Telehealth: Payer: Self-pay

## 2021-10-19 NOTE — Telephone Encounter (Signed)
Pt calling to request a refill of Meloxicam. Pt said it has been a while since her last refill. I told pt she may be asked to make an appt. Pt requested for me to send a note to the Dr. To see if she can get a few until she can get an appt. Please let pt know what can be done. Ottis Stain, CMA

## 2021-10-20 ENCOUNTER — Other Ambulatory Visit: Payer: Self-pay | Admitting: Family Medicine

## 2021-10-20 ENCOUNTER — Other Ambulatory Visit (HOSPITAL_COMMUNITY): Payer: Self-pay

## 2021-10-20 DIAGNOSIS — M25462 Effusion, left knee: Secondary | ICD-10-CM

## 2021-10-20 MED ORDER — MELOXICAM 15 MG PO TABS
15.0000 mg | ORAL_TABLET | Freq: Every day | ORAL | 0 refills | Status: DC
  Filled 2021-10-20: qty 15, 15d supply, fill #0

## 2021-10-20 NOTE — Progress Notes (Signed)
Per chart review, the last time I can tell mobic was prescribed for patient was in 2020. I can prescribe a very limited amount of this, but patient will need follow up for labs given that I have never seen her and she is on multiple medications that can effect kidney function and electrolytes (lasix, methadone, reported ASA, etc). Please have her schedule a follow up appt.  Gladys Damme, MD [facility], PGY-3

## 2021-10-23 ENCOUNTER — Encounter: Payer: Self-pay | Admitting: Gynecologic Oncology

## 2021-10-23 ENCOUNTER — Ambulatory Visit: Payer: Self-pay

## 2021-10-23 ENCOUNTER — Telehealth: Payer: Self-pay | Admitting: Pharmacist

## 2021-10-23 NOTE — Patient Outreach (Signed)
10/23/2021 Name: [name] MRN: [id number] DOB: 12-23-1960  Referred by: [name], MD Reason for referral : No chief complaint on file.   A successful outreach was attempted today. Patient states she is at another appointment and is unable to talk. Requests I call her back a different day.  Follow Up Plan: Rescheduled appointment for next available date on 11/09/21 at 10:00 AM   Jolly, CPP [place] [phone]
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
## 2021-10-26 ENCOUNTER — Inpatient Hospital Stay: Payer: Medicaid Other | Attending: Oncology | Admitting: Gynecologic Oncology

## 2021-10-26 ENCOUNTER — Other Ambulatory Visit: Payer: Self-pay | Admitting: Oncology

## 2021-10-26 ENCOUNTER — Encounter: Payer: Self-pay | Admitting: Gynecologic Oncology

## 2021-10-26 ENCOUNTER — Ambulatory Visit (HOSPITAL_BASED_OUTPATIENT_CLINIC_OR_DEPARTMENT_OTHER): Payer: Medicaid Other | Admitting: Gynecologic Oncology

## 2021-10-26 ENCOUNTER — Other Ambulatory Visit (HOSPITAL_COMMUNITY): Payer: Self-pay

## 2021-10-26 ENCOUNTER — Other Ambulatory Visit: Payer: Self-pay

## 2021-10-26 VITALS — BP 136/78 | HR 72 | Temp 98.1°F | Resp 18 | Ht 65.0 in | Wt 245.0 lb

## 2021-10-26 DIAGNOSIS — R3915 Urgency of urination: Secondary | ICD-10-CM | POA: Diagnosis not present

## 2021-10-26 DIAGNOSIS — Z79891 Long term (current) use of opiate analgesic: Secondary | ICD-10-CM | POA: Diagnosis not present

## 2021-10-26 DIAGNOSIS — F17210 Nicotine dependence, cigarettes, uncomplicated: Secondary | ICD-10-CM | POA: Diagnosis not present

## 2021-10-26 DIAGNOSIS — I10 Essential (primary) hypertension: Secondary | ICD-10-CM | POA: Diagnosis not present

## 2021-10-26 DIAGNOSIS — Z780 Asymptomatic menopausal state: Secondary | ICD-10-CM | POA: Diagnosis not present

## 2021-10-26 DIAGNOSIS — C50412 Malignant neoplasm of upper-outer quadrant of left female breast: Secondary | ICD-10-CM

## 2021-10-26 DIAGNOSIS — Z6841 Body Mass Index (BMI) 40.0 and over, adult: Secondary | ICD-10-CM | POA: Insufficient documentation

## 2021-10-26 DIAGNOSIS — Z7982 Long term (current) use of aspirin: Secondary | ICD-10-CM | POA: Insufficient documentation

## 2021-10-26 DIAGNOSIS — C50919 Malignant neoplasm of unspecified site of unspecified female breast: Secondary | ICD-10-CM | POA: Diagnosis not present

## 2021-10-26 DIAGNOSIS — Z791 Long term (current) use of non-steroidal anti-inflammatories (NSAID): Secondary | ICD-10-CM | POA: Insufficient documentation

## 2021-10-26 DIAGNOSIS — I700 Atherosclerosis of aorta: Secondary | ICD-10-CM | POA: Diagnosis not present

## 2021-10-26 DIAGNOSIS — Z9013 Acquired absence of bilateral breasts and nipples: Secondary | ICD-10-CM | POA: Diagnosis not present

## 2021-10-26 DIAGNOSIS — D75839 Thrombocytosis, unspecified: Secondary | ICD-10-CM | POA: Insufficient documentation

## 2021-10-26 DIAGNOSIS — N8502 Endometrial intraepithelial neoplasia [EIN]: Secondary | ICD-10-CM | POA: Insufficient documentation

## 2021-10-26 DIAGNOSIS — R9389 Abnormal findings on diagnostic imaging of other specified body structures: Secondary | ICD-10-CM | POA: Insufficient documentation

## 2021-10-26 DIAGNOSIS — I2510 Atherosclerotic heart disease of native coronary artery without angina pectoris: Secondary | ICD-10-CM | POA: Insufficient documentation

## 2021-10-26 DIAGNOSIS — M109 Gout, unspecified: Secondary | ICD-10-CM | POA: Diagnosis not present

## 2021-10-26 DIAGNOSIS — Z9221 Personal history of antineoplastic chemotherapy: Secondary | ICD-10-CM | POA: Diagnosis not present

## 2021-10-26 DIAGNOSIS — Z923 Personal history of irradiation: Secondary | ICD-10-CM | POA: Diagnosis not present

## 2021-10-26 DIAGNOSIS — E785 Hyperlipidemia, unspecified: Secondary | ICD-10-CM | POA: Insufficient documentation

## 2021-10-26 DIAGNOSIS — Z720 Tobacco use: Secondary | ICD-10-CM

## 2021-10-26 DIAGNOSIS — Z955 Presence of coronary angioplasty implant and graft: Secondary | ICD-10-CM | POA: Insufficient documentation

## 2021-10-26 DIAGNOSIS — G893 Neoplasm related pain (acute) (chronic): Secondary | ICD-10-CM | POA: Diagnosis not present

## 2021-10-26 DIAGNOSIS — I252 Old myocardial infarction: Secondary | ICD-10-CM | POA: Insufficient documentation

## 2021-10-26 DIAGNOSIS — Z79810 Long term (current) use of selective estrogen receptor modulators (SERMs): Secondary | ICD-10-CM | POA: Diagnosis not present

## 2021-10-26 DIAGNOSIS — I517 Cardiomegaly: Secondary | ICD-10-CM | POA: Diagnosis not present

## 2021-10-26 DIAGNOSIS — I8390 Asymptomatic varicose veins of unspecified lower extremity: Secondary | ICD-10-CM | POA: Insufficient documentation

## 2021-10-26 DIAGNOSIS — Z79899 Other long term (current) drug therapy: Secondary | ICD-10-CM | POA: Insufficient documentation

## 2021-10-26 MED ORDER — SENNOSIDES-DOCUSATE SODIUM 8.6-50 MG PO TABS: 2.0000 | ORAL_TABLET | Freq: Every day | ORAL | 0 refills | Status: DC

## 2021-10-26 MED ORDER — IBUPROFEN 800 MG PO TABS: 800.0000 mg | ORAL_TABLET | Freq: Three times a day (TID) | ORAL | 0 refills | Status: DC | PRN

## 2021-10-26 MED ORDER — METHADONE HCL 5 MG PO TABS
15.0000 mg | ORAL_TABLET | Freq: Three times a day (TID) | ORAL | 0 refills | Status: DC | PRN
  Filled 2021-10-26 – 2021-10-28 (×3): qty 270, 30d supply, fill #0

## 2021-10-26 NOTE — Patient Instructions (Addendum)
Preparing for your Surgery  Plan for surgery on November 17, 2021 with Dr. Jeral Pinch at [place] will be scheduled for robotic assisted total laparoscopic hysterectomy (removal of the uterus and cervix), bilateral salpingo-oophorectomy (removal of both ovaries and fallopian tubes), sentinel lymph node biopsy, possible lymph node dissection, possible laparotomy (larger incision on the abdomen if needed), mini laparotomy for specimen delivery.   We will let you know Dr. Virgie Dad recommendations regarding your tamoxifen before and after surgery.  Pre-operative Testing -You will receive a phone call from presurgical testing at [facility] to arrange for a pre-operative appointment and lab work.  -Bring your insurance card, copy of an advanced directive if applicable, medication list  -At that visit, you will be asked to sign a consent for a possible blood transfusion in case a transfusion becomes necessary during surgery.  The need for a blood transfusion is rare but having consent is a necessary part of your care.     -You are fine to keep taking your baby aspirin (81mg ) with your last dose being the day before surgery.  -You will need to stop taking your supplements now. Do not take supplements such as fish oil (omega 3), red yeast rice, turmeric before your surgery. You want to avoid medications with aspirin in them including headache powders such as BC or Goody's), Excedrin migraine.  Day Before Surgery at [place] will be asked to take in a light diet the day before surgery. You will be advised you can have clear liquids up until 3 hours before your surgery.    Eat a light diet the day before surgery.  Examples including soups, broths, toast, yogurt, mashed potatoes.  AVOID GAS PRODUCING FOODS. Things to avoid include carbonated beverages (fizzy beverages, sodas), raw fruits and raw vegetables (uncooked), or beans.   If your bowels are filled with gas, your  surgeon will have difficulty visualizing your pelvic organs which increases your surgical risks.  Your role in recovery Your role is to become active as soon as directed by your doctor, while still giving yourself time to heal.  Rest when you feel tired. You will be asked to do the following in order to speed your recovery:  - Cough and breathe deeply. This helps to clear and expand your lungs and can prevent pneumonia after surgery.  - [place]. Do mild physical activity. Walking or moving your legs help your circulation and body functions return to normal. Do not try to get up or walk alone the first time after surgery.   -If you develop swelling on one leg or the other, pain in the back of your leg, redness/warmth in one of your legs, please call the office or go to the Emergency Room to have a doppler to rule out a blood clot. For shortness of breath, chest pain-seek care in the Emergency Room as soon as possible. - Actively manage your pain. Managing your pain lets you move in comfort. We will ask you to rate your pain on a scale of zero to 10. It is your responsibility to tell your doctor or nurse where and how much you hurt so your pain can be treated.  Special Considerations -If you are diabetic, you may be placed on insulin after surgery to have closer control over your blood sugars to promote healing and recovery.  This does not mean that you will be discharged on insulin.  If applicable, your oral antidiabetics will  be resumed when you are tolerating a solid diet.  -Your final pathology results from surgery should be available around one week after surgery and the results will be relayed to you when available.  -Dr. Lahoma Crocker is the surgeon that assists your GYN Oncologist with surgery.  If you end up staying the night, the next day after your surgery you will either see Dr. Denman George, Dr. Berline Lopes, or Dr. Lahoma Crocker.  -FMLA forms can be faxed to  [phone] and please allow 5-7 business days for completion.  Pain Management After Surgery -You have been prescribed your bowel regimen medication along with prescription strength ibuprofen before surgery so that you can have these available when you are discharged from the hospital. The pain medication is for use ONLY AFTER surgery. You can continue use of methadone after surgery as well.   -Make sure that you have Tylenol and Ibuprofen at home to use on a regular basis after surgery for pain control. We recommend alternating the medications every hour to six hours since they work differently and are processed in the body differently for pain relief.  -Review the attached handout on narcotic use and their risks and side effects.   Bowel Regimen -You have been prescribed Sennakot-S to take nightly to prevent constipation especially if you are taking the narcotic pain medication intermittently.  It is important to prevent constipation and drink adequate amounts of liquids. You can stop taking this medication when you are not taking pain medication and you are back on your normal bowel routine.  Risks of Surgery Risks of surgery are low but include bleeding, infection, damage to surrounding structures, re-operation, blood clots, and very rarely death.   Blood Transfusion Information (For the consent to be signed before surgery)  We will be checking your blood type before surgery so in case of emergencies, we will know what type of blood you would need.                                            WHAT IS A BLOOD TRANSFUSION?  A transfusion is the replacement of blood or some of its parts. Blood is made up of multiple cells which provide different functions. Red blood cells carry oxygen and are used for blood loss replacement. White blood cells fight against infection. Platelets control bleeding. Plasma helps clot blood. Other blood products are available for specialized needs, such as  hemophilia or other clotting disorders. BEFORE THE TRANSFUSION  Who gives blood for transfusions?  You may be able to donate blood to be used at a later date on yourself (autologous donation). Relatives can be asked to donate blood. This is generally not any safer than if you have received blood from a stranger. The same precautions are taken to ensure safety when a relative's blood is donated. Healthy volunteers who are fully evaluated to make sure their blood is safe. This is blood bank blood. Transfusion therapy is the safest it has ever been in the practice of medicine. Before blood is taken from a donor, a complete history is taken to make sure that person has no history of diseases nor engages in risky social behavior (examples are intravenous drug use or sexual activity with multiple partners). The donor's travel history is screened to minimize risk of transmitting infections, such as malaria. The donated blood is tested for signs of infectious diseases,  such as HIV and hepatitis. The blood is then tested to be sure it is compatible with you in order to minimize the chance of a transfusion reaction. If you or a relative donates blood, this is often done in anticipation of surgery and is not appropriate for emergency situations. It takes many days to process the donated blood. RISKS AND COMPLICATIONS Although transfusion therapy is very safe and saves many lives, the main dangers of transfusion include:  Getting an infectious disease. Developing a transfusion reaction. This is an allergic reaction to something in the blood you were given. Every precaution is taken to prevent this. The decision to have a blood transfusion has been considered carefully by your caregiver before blood is given. Blood is not given unless the benefits outweigh the risks.  AFTER SURGERY INSTRUCTIONS  Return to work: 4-6 weeks if applicable  You will have a white honeycomb dressing over your larger incision. This  dressing can be removed 5 days after surgery and you do not need to reapply a new dressing. Once you remove the dressing, you will notice that you have the surgical glue (dermabond) on the incision and this will peel off on its own. You can get this dressing wet in the shower the days after surgery prior to removal on the 5th day.   Activity: 1. Be up and out of the bed during the day.  Take a nap if needed.  You may walk up steps but be careful and use the hand rail.  Stair climbing will tire you more than you think, you may need to stop part way and rest.   2. No lifting or straining for 6 weeks over 10 pounds. No pushing, pulling, straining for 6 weeks.  3. No driving for 1 week(s).  Do not drive if you are taking narcotic pain medicine and make sure that your reaction time has returned.   4. You can shower as soon as the next day after surgery. Shower daily.  Use your regular soap and water (not directly on the incision) and pat your incision(s) dry afterwards; don't rub.  No tub baths or submerging your body in water until cleared by your surgeon. If you have the soap that was given to you by pre-surgical testing that was used before surgery, you do not need to use it afterwards because this can irritate your incisions.   5. No sexual activity and nothing in the vagina for 8 weeks.  6. You may experience a small amount of clear drainage from your incisions, which is normal.  If the drainage persists, increases, or changes color please call the office.  7. Do not use creams, lotions, or ointments such as neosporin on your incisions after surgery until advised by your surgeon because they can cause removal of the dermabond glue on your incisions.    8. You may experience vaginal spotting after surgery or around the 6-8 week mark from surgery when the stitches at the top of the vagina begin to dissolve.  The spotting is normal but if you experience heavy bleeding, call our office.  9. Take  Tylenol or ibuprofen first for pain and continue pain medication regimen with methadone.  Monitor your Tylenol intake to a max of 4,000 mg in a 24 hour period. You can alternate these medications after surgery.  Diet: 1. Low sodium Heart Healthy Diet is recommended but you are cleared to resume your normal (before surgery) diet after your procedure.  2. It is safe  to use a laxative, such as Miralax or Colace, if you have difficulty moving your bowels. You have been prescribed Sennakot at bedtime every evening to keep bowel movements regular and to prevent constipation.    Wound Care: 1. Keep clean and dry.  Shower daily.  Reasons to call the Doctor: Fever - Oral temperature greater than 100.4 degrees Fahrenheit Foul-smelling vaginal discharge Difficulty urinating Nausea and vomiting Increased pain at the site of the incision that is unrelieved with pain medicine. Difficulty breathing with or without chest pain New calf pain especially if only on one side Sudden, continuing increased vaginal bleeding with or without clots.   Contacts: For questions or concerns you should contact:  Dr. Jeral Pinch at [phone]  Joylene John, NP at [phone]  After Hours: call [phone] and have the GYN Oncologist paged/contacted (after 5 pm or on the weekends).  Messages sent via mychart are for non-urgent matters and are not responded to after hours so for urgent needs, please call the after hours number.

## 2021-10-27 ENCOUNTER — Telehealth: Payer: Self-pay

## 2021-10-27 ENCOUNTER — Other Ambulatory Visit: Payer: Self-pay | Admitting: Gynecologic Oncology

## 2021-10-27 ENCOUNTER — Other Ambulatory Visit (HOSPITAL_COMMUNITY): Payer: Self-pay

## 2021-10-27 DIAGNOSIS — N8502 Endometrial intraepithelial neoplasia [EIN]: Secondary | ICD-10-CM

## 2021-10-27 NOTE — Progress Notes (Signed)
Patient here for new patient consultation with Dr. Jeral Pinch and for a pre-operative discussion prior to her scheduled surgery on November 17, 2021. She is scheduled for robotic assisted total laparoscopic hysterectomy, bilateral salpingo-oophorectomy, sentinel lymph node biopsy, possible lymph node dissection, possible laparotomy, mini laparotomy for specimen delivery. The surgery was discussed in detail.  See after visit summary for additional details. Visual aids used to discuss items related to surgery including sequential compression stockings, foley catheter, IV pump, multi-modal pain regimen including tylenol, photo of the surgical robot, female reproductive system to discuss surgery in detail.      Discussed post-op pain management in detail including the aspects of the enhanced recovery pathway. She is on a chronic pain regimen including methadone and will plan to continue this in the post-op period. We discussed the use of tylenol post-op and to monitor for a maximum of 4,000 mg in a 24 hour period. Ibuprofen use discussed. She states she has not been taking mobic and advised to not take with ibuprofen if she resumes this. Also prescribed sennakot to be used after surgery and to hold if having loose stools.  Discussed bowel regimen in detail.     Discussed the use of heparin pre-op, SCDs, and measures to take at home to prevent DVT including frequent mobility.  Reportable signs and symptoms of DVT discussed. Post-operative instructions discussed and expectations for after surgery. Incisional care discussed as well including reportable signs and symptoms including erythema, drainage, wound separation. Information given around op site dressing care and removal.    10 minutes spent with the patient.  Verbalizing understanding of material discussed. No needs or concerns voiced at the end of the visit.   Advised patient to call for any needs.  Advised that her post-operative medications had been  prescribed and could be picked up at any time.   We will reach out to Dr. [name] about recommendations for tamoxifen use before and after surgery. She is advised to stop taking her supplements and herbal medications now.   This appointment is included in the global surgical bundle as pre-operative teaching and has no charge.

## 2021-10-27 NOTE — Patient Instructions (Signed)
Preparing for your Surgery   Plan for surgery on November 17, 2021 with Dr. Jeral Pinch at [facility] will be scheduled for robotic assisted total laparoscopic hysterectomy (removal of the uterus and cervix), bilateral salpingo-oophorectomy (removal of both ovaries and fallopian tubes), sentinel lymph node biopsy, possible lymph node dissection, possible laparotomy (larger incision on the abdomen if needed), mini laparotomy for specimen delivery.    We will let you know Dr. Virgie Dad recommendations regarding your tamoxifen before and after surgery.   Pre-operative Testing -You will receive a phone call from presurgical testing at [facility] to arrange for a pre-operative appointment and lab work.   -Bring your insurance card, copy of an advanced directive if applicable, medication list   -At that visit, you will be asked to sign a consent for a possible blood transfusion in case a transfusion becomes necessary during surgery.  The need for a blood transfusion is rare but having consent is a necessary part of your care.      -You are fine to keep taking your baby aspirin (81mg ) with your last dose being the day before surgery.   -You will need to stop taking your supplements now. Do not take supplements such as fish oil (omega 3), red yeast rice, turmeric before your surgery. You want to avoid medications with aspirin in them including headache powders such as BC or Goody's), Excedrin migraine.   Day Before Surgery at [place] will be asked to take in a light diet the day before surgery. You will be advised you can have clear liquids up until 3 hours before your surgery.     Eat a light diet the day before surgery.  Examples including soups, broths, toast, yogurt, mashed potatoes.  AVOID GAS PRODUCING FOODS. Things to avoid include carbonated beverages (fizzy beverages, sodas), raw fruits and raw vegetables (uncooked), or beans.    If your bowels are filled with  gas, your surgeon will have difficulty visualizing your pelvic organs which increases your surgical risks.   Your role in recovery Your role is to become active as soon as directed by your doctor, while still giving yourself time to heal.  Rest when you feel tired. You will be asked to do the following in order to speed your recovery:   - Cough and breathe deeply. This helps to clear and expand your lungs and can prevent pneumonia after surgery.  - [place]. Do mild physical activity. Walking or moving your legs help your circulation and body functions return to normal. Do not try to get up or walk alone the first time after surgery.   -If you develop swelling on one leg or the other, pain in the back of your leg, redness/warmth in one of your legs, please call the office or go to the Emergency Room to have a doppler to rule out a blood clot. For shortness of breath, chest pain-seek care in the Emergency Room as soon as possible. - Actively manage your pain. Managing your pain lets you move in comfort. We will ask you to rate your pain on a scale of zero to 10. It is your responsibility to tell your doctor or nurse where and how much you hurt so your pain can be treated.   Special Considerations -If you are diabetic, you may be placed on insulin after surgery to have closer control over your blood sugars to promote healing and recovery.  This does not mean that  you will be discharged on insulin.  If applicable, your oral antidiabetics will be resumed when you are tolerating a solid diet.   -Your final pathology results from surgery should be available around one week after surgery and the results will be relayed to you when available.   -Dr. Lahoma Crocker is the surgeon that assists your GYN Oncologist with surgery.  If you end up staying the night, the next day after your surgery you will either see Dr. Denman George, Dr. Berline Lopes, or Dr. Lahoma Crocker.   -FMLA forms can be  faxed to [phone] and please allow 5-7 business days for completion.   Pain Management After Surgery -You have been prescribed your bowel regimen medication along with prescription strength ibuprofen before surgery so that you can have these available when you are discharged from the hospital. The pain medication is for use ONLY AFTER surgery. You can continue use of methadone after surgery as well.    -Make sure that you have Tylenol and Ibuprofen at home to use on a regular basis after surgery for pain control. We recommend alternating the medications every hour to six hours since they work differently and are processed in the body differently for pain relief.   -Review the attached handout on narcotic use and their risks and side effects.    Bowel Regimen -You have been prescribed Sennakot-S to take nightly to prevent constipation especially if you are taking the narcotic pain medication intermittently.  It is important to prevent constipation and drink adequate amounts of liquids. You can stop taking this medication when you are not taking pain medication and you are back on your normal bowel routine.   Risks of Surgery Risks of surgery are low but include bleeding, infection, damage to surrounding structures, re-operation, blood clots, and very rarely death.     Blood Transfusion Information (For the consent to be signed before surgery)   We will be checking your blood type before surgery so in case of emergencies, we will know what type of blood you would need.                                             WHAT IS A BLOOD TRANSFUSION?   A transfusion is the replacement of blood or some of its parts. Blood is made up of multiple cells which provide different functions. Red blood cells carry oxygen and are used for blood loss replacement. White blood cells fight against infection. Platelets control bleeding. Plasma helps clot blood. Other blood products are available for specialized  needs, such as hemophilia or other clotting disorders. BEFORE THE TRANSFUSION  Who gives blood for transfusions?  You may be able to donate blood to be used at a later date on yourself (autologous donation). Relatives can be asked to donate blood. This is generally not any safer than if you have received blood from a stranger. The same precautions are taken to ensure safety when a relative's blood is donated. Healthy volunteers who are fully evaluated to make sure their blood is safe. This is blood bank blood. Transfusion therapy is the safest it has ever been in the practice of medicine. Before blood is taken from a donor, a complete history is taken to make sure that person has no history of diseases nor engages in risky social behavior (examples are intravenous drug use or sexual activity with multiple  partners). The donor's travel history is screened to minimize risk of transmitting infections, such as malaria. The donated blood is tested for signs of infectious diseases, such as HIV and hepatitis. The blood is then tested to be sure it is compatible with you in order to minimize the chance of a transfusion reaction. If you or a relative donates blood, this is often done in anticipation of surgery and is not appropriate for emergency situations. It takes many days to process the donated blood. RISKS AND COMPLICATIONS Although transfusion therapy is very safe and saves many lives, the main dangers of transfusion include:  Getting an infectious disease. Developing a transfusion reaction. This is an allergic reaction to something in the blood you were given. Every precaution is taken to prevent this. The decision to have a blood transfusion has been considered carefully by your caregiver before blood is given. Blood is not given unless the benefits outweigh the risks.   AFTER SURGERY INSTRUCTIONS   Return to work: 4-6 weeks if applicable   You will have a white honeycomb dressing over your larger  incision. This dressing can be removed 5 days after surgery and you do not need to reapply a new dressing. Once you remove the dressing, you will notice that you have the surgical glue (dermabond) on the incision and this will peel off on its own. You can get this dressing wet in the shower the days after surgery prior to removal on the 5th day.    Activity: 1. Be up and out of the bed during the day.  Take a nap if needed.  You may walk up steps but be careful and use the hand rail.  Stair climbing will tire you more than you think, you may need to stop part way and rest.    2. No lifting or straining for 6 weeks over 10 pounds. No pushing, pulling, straining for 6 weeks.   3. No driving for 1 week(s).  Do not drive if you are taking narcotic pain medicine and make sure that your reaction time has returned.    4. You can shower as soon as the next day after surgery. Shower daily.  Use your regular soap and water (not directly on the incision) and pat your incision(s) dry afterwards; don't rub.  No tub baths or submerging your body in water until cleared by your surgeon. If you have the soap that was given to you by pre-surgical testing that was used before surgery, you do not need to use it afterwards because this can irritate your incisions.    5. No sexual activity and nothing in the vagina for 8 weeks.   6. You may experience a small amount of clear drainage from your incisions, which is normal.  If the drainage persists, increases, or changes color please call the office.   7. Do not use creams, lotions, or ointments such as neosporin on your incisions after surgery until advised by your surgeon because they can cause removal of the dermabond glue on your incisions.     8. You may experience vaginal spotting after surgery or around the 6-8 week mark from surgery when the stitches at the top of the vagina begin to dissolve.  The spotting is normal but if you experience heavy bleeding, call our  office.   9. Take Tylenol or ibuprofen first for pain and continue pain medication regimen with methadone.  Monitor your Tylenol intake to a max of 4,000 mg in a 24  hour period. You can alternate these medications after surgery.   Diet: 1. Low sodium Heart Healthy Diet is recommended but you are cleared to resume your normal (before surgery) diet after your procedure.   2. It is safe to use a laxative, such as Miralax or Colace, if you have difficulty moving your bowels. You have been prescribed Sennakot at bedtime every evening to keep bowel movements regular and to prevent constipation.     Wound Care: 1. Keep clean and dry.  Shower daily.   Reasons to call the Doctor: Fever - Oral temperature greater than 100.4 degrees Fahrenheit Foul-smelling vaginal discharge Difficulty urinating Nausea and vomiting Increased pain at the site of the incision that is unrelieved with pain medicine. Difficulty breathing with or without chest pain New calf pain especially if only on one side Sudden, continuing increased vaginal bleeding with or without clots.   Contacts: For questions or concerns you should contact:   Dr. Jeral Pinch at [phone]   Joylene John, NP at [phone]   After Hours: call [phone] and have the GYN Oncologist paged/contacted (after 5 pm or on the weekends).   Messages sent via mychart are for non-urgent matters and are not responded to after hours so for urgent needs, please call the after hours number.

## 2021-10-27 NOTE — Telephone Encounter (Signed)
Spoke with Ms. Person regarding tamoxifen. Per Dr. [name] patient is to hold tamoxifen 1 week prior to surgery and 1 week after surgery.   Patient inquiring about staying over night. Dr. Lulu Riding plan will be for same day discharge and if there is a medical reason to stay, then that would take place.   Patient verbalizes understanding of above information. Instructed to call with any questions or concerns.

## 2021-10-28 ENCOUNTER — Other Ambulatory Visit (HOSPITAL_COMMUNITY): Payer: Self-pay

## 2021-10-29 ENCOUNTER — Telehealth: Payer: Self-pay | Admitting: *Deleted

## 2021-10-29 NOTE — Telephone Encounter (Signed)
   Pre-operative Risk Assessment    Patient Name: [name]  DOB: 1961-03-26 MRN: [id number]      Request for Surgical Clearance   Procedure:   ROBOTIC ASSISTED ENDOMETRIAL HYPERPLASIA LATERAL SALPINGO-OOPHORECTOMY, SENTINEL LYMPH NODE Bx, POSSIBLE MINI LAPAROTOMY  Date of Surgery: Clearance 11/17/21                                 Surgeon:  DR. Jeral Pinch Surgeon's Group or Practice Name:  [facility]; [place] Phone number:  [phone] Fax number:  [phone]   Type of Clearance Requested: - Medical  - Pharmacy:  Hold Aspirin ; SEE 02/08/18 OV NOTE SCOTT WEAVER, PAC IN REGARD TO ASA FOR PRE OP AT THE TIME; DR. Berline Lopes IS ASKING DUE TO MAJOR SURGERY AT THIS TIME, DOES PT NEED ANTICOAGULATION   Type of Anesthesia:   General    Additional requests/questions:   Jiles Prows   10/29/2021, 1:50 PM

## 2021-10-29 NOTE — Telephone Encounter (Signed)
Tried to contact pt 2x and it went to just a busy signal and there was no option to leave a VM. Jaden Abreu Zimmerman Rumple, CMA

## 2021-10-29 NOTE — Telephone Encounter (Signed)
I tried to call the pt to make an appt for pre op clearance . Line was busy.

## 2021-10-29 NOTE — Telephone Encounter (Signed)
Fax records and surgical optimization form to the patient's PCP and cardiology appt

## 2021-10-29 NOTE — Telephone Encounter (Signed)
   Name: [name]  DOB: [birth year]  MRN: [id number]  Primary Cardiologist: Sinclair Grooms, MD or Richardson Dopp, [place]  Chart reviewed as part of pre-operative protocol coverage. Because of [name] past medical history and time since last visit, she will require a follow-up visit in order to better assess preoperative cardiovascular risk.  Pre-op covering staff: - Please schedule appointment and call patient to inform them. If patient already had an upcoming appointment within acceptable timeframe, please add "pre-op clearance" to the appointment notes so provider is aware. - Please contact requesting surgeon's office via preferred method (i.e, phone, fax) to inform them of need for appointment prior to surgery.  If applicable, this message will also be routed to pharmacy pool and/or primary cardiologist for input on holding anticoagulant/antiplatelet agent as requested below so that this information is available to the clearing provider at time of patient's appointment.   [place], [place]  10/29/2021, 2:07 PM

## 2021-10-30 ENCOUNTER — Ambulatory Visit: Payer: Medicaid Other | Attending: Oncology | Admitting: Rehabilitation

## 2021-10-30 ENCOUNTER — Encounter: Payer: Self-pay | Admitting: *Deleted

## 2021-10-30 ENCOUNTER — Other Ambulatory Visit: Payer: Self-pay

## 2021-10-30 ENCOUNTER — Encounter: Payer: Self-pay | Admitting: Rehabilitation

## 2021-10-30 DIAGNOSIS — I890 Lymphedema, not elsewhere classified: Secondary | ICD-10-CM | POA: Diagnosis not present

## 2021-10-30 DIAGNOSIS — M6281 Muscle weakness (generalized): Secondary | ICD-10-CM | POA: Diagnosis present

## 2021-10-30 DIAGNOSIS — R293 Abnormal posture: Secondary | ICD-10-CM | POA: Insufficient documentation

## 2021-10-30 DIAGNOSIS — C50412 Malignant neoplasm of upper-outer quadrant of left female breast: Secondary | ICD-10-CM | POA: Diagnosis not present

## 2021-10-30 DIAGNOSIS — R262 Difficulty in walking, not elsewhere classified: Secondary | ICD-10-CM | POA: Diagnosis present

## 2021-10-30 DIAGNOSIS — I972 Postmastectomy lymphedema syndrome: Secondary | ICD-10-CM | POA: Diagnosis present
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
## 2021-10-30 NOTE — Telephone Encounter (Signed)
Received message from Dr Tamala Julian office that they have been trying to reach the patient. Called and spoke with the patient; gave the patient the message for call Dr Thompson Caul office.

## 2021-10-30 NOTE — Telephone Encounter (Signed)
I tried to reach pt again this morning to offer an appt today at 10:55 with [name], PAC for pre op clearance, though line busy. I then saw a DPR from [facility] with all [place] entities ok to s/w her fiance [name] or her niece. I called the number for the [place] [phone] though just kept ringing and no vm came on. I will update the requesting office that we are trying to reach the pt for appt for pre op clearance. If surgeon's office does happen to s/w the pt, maybe they can let her know she needs to call the cardiologist office for pre op appt, ph# [phone]. At this time our schedules are filling up quickly and she can be seen with Dr. Tamala Julian or any APP at either [place], [place] or [place] locations.

## 2021-10-30 NOTE — Progress Notes (Signed)
Clinical Social Work completed Lexicographer portion and sent on [employer].  Maryjean Morn, MSW, LCSW, OSW-C Clinical Social Worker [facility] [phone]

## 2021-10-30 NOTE — Telephone Encounter (Signed)
Pt was offered an appt today, though declined. Pt has been scheduled for 11/25/21 with Laurann [place], NP for pre op clearance. Pt's surgery will have to be post-pone until she has been seen by the cardiologist. I will update the surgeon's office. Will send notes to NP for upcoming appt . Pt has been placed on the wait list for possible sooner appt as well.

## 2021-11-02 ENCOUNTER — Ambulatory Visit: Payer: Medicaid Other | Admitting: Podiatry

## 2021-11-02 ENCOUNTER — Telehealth: Payer: Self-pay

## 2021-11-02 NOTE — Telephone Encounter (Signed)
NOted. Will address at upcoming follow up.   Loel Dubonnet, NP

## 2021-11-02 NOTE — Telephone Encounter (Signed)
Spoke with [name] this afternoon. Surgery has been rescheduled from 11/17/21 to 12/01/21 due to cardiology appointment. Patient anticipated this and is ok with moving surgery. She understands that the surgical date may need to be changed again if additional testing is needed.  Reminded patient that she is to hold tamoxifen 1 week prior to surgery and one week after surgery. The last day she will take tamoxifen is 11/23/21. Patient verbalizes understanding. Instructed patient that pre-admission testing will be in touch regarding her appointment. Instructed to call with any questions or concerns.

## 2021-11-02 NOTE — Telephone Encounter (Signed)
Contacted pt and informed her to schedule an appointment for future refills per Dr. Chauncey Reading and she said she would call to do this.Dmetrius Ambs Zimmerman Rumple, CMA

## 2021-11-06 DIAGNOSIS — S8262XA Displaced fracture of lateral malleolus of left fibula, initial encounter for closed fracture: Secondary | ICD-10-CM | POA: Diagnosis not present

## 2021-11-07 DIAGNOSIS — S8262XA Displaced fracture of lateral malleolus of left fibula, initial encounter for closed fracture: Secondary | ICD-10-CM | POA: Diagnosis not present

## 2021-11-08 DIAGNOSIS — S8262XA Displaced fracture of lateral malleolus of left fibula, initial encounter for closed fracture: Secondary | ICD-10-CM | POA: Diagnosis not present

## 2021-11-09 ENCOUNTER — Other Ambulatory Visit (HOSPITAL_COMMUNITY): Payer: Self-pay | Admitting: *Deleted

## 2021-11-09 ENCOUNTER — Ambulatory Visit: Payer: Medicaid Other

## 2021-11-09 DIAGNOSIS — S8262XA Displaced fracture of lateral malleolus of left fibula, initial encounter for closed fracture: Secondary | ICD-10-CM | POA: Diagnosis not present

## 2021-11-10 ENCOUNTER — Encounter (HOSPITAL_COMMUNITY): Payer: Medicaid Other

## 2021-11-10 DIAGNOSIS — S8262XA Displaced fracture of lateral malleolus of left fibula, initial encounter for closed fracture: Secondary | ICD-10-CM | POA: Diagnosis not present

## 2021-11-11 ENCOUNTER — Ambulatory Visit: Payer: Medicaid Other | Admitting: Podiatry

## 2021-11-11 DIAGNOSIS — S8262XA Displaced fracture of lateral malleolus of left fibula, initial encounter for closed fracture: Secondary | ICD-10-CM | POA: Diagnosis not present

## 2021-11-12 DIAGNOSIS — S8262XA Displaced fracture of lateral malleolus of left fibula, initial encounter for closed fracture: Secondary | ICD-10-CM | POA: Diagnosis not present

## 2021-11-13 DIAGNOSIS — S8262XA Displaced fracture of lateral malleolus of left fibula, initial encounter for closed fracture: Secondary | ICD-10-CM | POA: Diagnosis not present

## 2021-11-14 DIAGNOSIS — S8262XA Displaced fracture of lateral malleolus of left fibula, initial encounter for closed fracture: Secondary | ICD-10-CM | POA: Diagnosis not present

## 2021-11-15 DIAGNOSIS — S8262XA Displaced fracture of lateral malleolus of left fibula, initial encounter for closed fracture: Secondary | ICD-10-CM | POA: Diagnosis not present

## 2021-11-16 DIAGNOSIS — S8262XA Displaced fracture of lateral malleolus of left fibula, initial encounter for closed fracture: Secondary | ICD-10-CM | POA: Diagnosis not present

## 2021-11-17 DIAGNOSIS — N8502 Endometrial intraepithelial neoplasia [EIN]: Secondary | ICD-10-CM

## 2021-11-17 DIAGNOSIS — S8262XA Displaced fracture of lateral malleolus of left fibula, initial encounter for closed fracture: Secondary | ICD-10-CM | POA: Diagnosis not present

## 2021-11-18 DIAGNOSIS — S8262XA Displaced fracture of lateral malleolus of left fibula, initial encounter for closed fracture: Secondary | ICD-10-CM | POA: Diagnosis not present

## 2021-11-19 DIAGNOSIS — S8262XA Displaced fracture of lateral malleolus of left fibula, initial encounter for closed fracture: Secondary | ICD-10-CM | POA: Diagnosis not present

## 2021-11-20 DIAGNOSIS — S8262XA Displaced fracture of lateral malleolus of left fibula, initial encounter for closed fracture: Secondary | ICD-10-CM | POA: Diagnosis not present

## 2021-11-21 DIAGNOSIS — S8262XA Displaced fracture of lateral malleolus of left fibula, initial encounter for closed fracture: Secondary | ICD-10-CM | POA: Diagnosis not present

## 2021-11-22 DIAGNOSIS — S8262XA Displaced fracture of lateral malleolus of left fibula, initial encounter for closed fracture: Secondary | ICD-10-CM | POA: Diagnosis not present

## 2021-11-23 ENCOUNTER — Ambulatory Visit: Payer: Medicaid Other | Admitting: Podiatry

## 2021-11-23 ENCOUNTER — Other Ambulatory Visit (HOSPITAL_COMMUNITY): Payer: Self-pay

## 2021-11-23 ENCOUNTER — Other Ambulatory Visit: Payer: Self-pay | Admitting: Oncology

## 2021-11-23 DIAGNOSIS — C50412 Malignant neoplasm of upper-outer quadrant of left female breast: Secondary | ICD-10-CM

## 2021-11-23 DIAGNOSIS — S8262XA Displaced fracture of lateral malleolus of left fibula, initial encounter for closed fracture: Secondary | ICD-10-CM | POA: Diagnosis not present

## 2021-11-23 MED ORDER — METHADONE HCL 5 MG PO TABS
15.0000 mg | ORAL_TABLET | Freq: Three times a day (TID) | ORAL | 0 refills | Status: DC | PRN
  Filled 2021-11-23: qty 270, 30d supply, fill #0

## 2021-11-24 ENCOUNTER — Telehealth: Payer: Medicaid Other | Admitting: Gynecologic Oncology

## 2021-11-24 ENCOUNTER — Other Ambulatory Visit (HOSPITAL_COMMUNITY): Payer: Self-pay

## 2021-11-24 DIAGNOSIS — S8262XA Displaced fracture of lateral malleolus of left fibula, initial encounter for closed fracture: Secondary | ICD-10-CM | POA: Diagnosis not present

## 2021-11-25 ENCOUNTER — Other Ambulatory Visit (HOSPITAL_COMMUNITY): Payer: Self-pay

## 2021-11-25 ENCOUNTER — Ambulatory Visit (HOSPITAL_BASED_OUTPATIENT_CLINIC_OR_DEPARTMENT_OTHER): Payer: Medicaid Other | Admitting: Family

## 2021-11-25 DIAGNOSIS — S8262XA Displaced fracture of lateral malleolus of left fibula, initial encounter for closed fracture: Secondary | ICD-10-CM | POA: Diagnosis not present
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
## 2021-11-25 NOTE — Progress Notes (Addendum)
COVID swab appointment: N/A  COVID Vaccine Completed:  Yes x2 Date COVID Vaccine completed: 03-09-20 03-30-20 Has received booster: Yes x1 07-27-21 COVID vaccine manufacturer: Pfizer      Date of COVID positive in last 90 days:  PCP - [name], MD Cardiologist - Daneen Schick, MD  Chest x-ray - CT chest 03-17-21 Epic EKG -  Stress Test - 12-24-19 Epic ECHO - greater than 2 years, Epic Cardiac Cath - greater than 2 years Pacemaker/ICD device last checked: Spinal Cord Stimulator:  Sleep Study -  CPAP -   Fasting Blood Sugar -  Checks Blood Sugar _____ times a day  Blood Thinner Instructions: Aspirin Instructions:  ASA 81 mg  Last Dose:  Activity level:  Can go up a flight of stairs and perform activities of daily living without stopping and without symptoms of chest pain or shortness of breath.   Able to exercise without symptoms  Unable to go up a flight of stairs without symptoms of      Anesthesia review:  CAD, cardiomegaly, Hx of MI  Patient denies shortness of breath, fever, cough and chest pain at PAT appointment   Patient verbalized understanding of instructions that were given to them at the PAT appointment. Patient was also instructed that they will need to review over the PAT instructions again at home before surgery.

## 2021-11-25 NOTE — Telephone Encounter (Signed)
Called pt regarding her appointment that she missed today for preop clearance. Left a message for her to call back if she would like to reschedule.  I will route back to the requesting surgeon's office to make them aware.  With the difficulty in the past of reaching pt and getting her scheduled, I will fwd to preop pool to see if they would like to get it out of the pool, and if pt calls to reschedule, we can let the surgeon's office know.

## 2021-11-25 NOTE — Patient Instructions (Signed)
DUE TO COVID-19 ONLY ONE VISITOR IS ALLOWED TO COME WITH YOU AND STAY IN THE WAITING ROOM ONLY DURING PRE OP AND PROCEDURE.   **NO VISITORS ARE ALLOWED IN THE SHORT STAY AREA OR RECOVERY ROOM!!**  You are not required to quarantine, however you are required to wear a well-fitted mask when you are out and around people not in your household.  Hand Hygiene often Do NOT share personal items Notify your provider if you are in close contact with someone who has COVID or you develop fever 100.4 or greater, new onset of sneezing, cough, sore throat, shortness of breath or body aches.       Your procedure is scheduled on:  Tuesday, 12-01-21   Report to [facility] Main  Entrance     Report to admitting at 5:15 AM   Call this number if you have problems the morning of surgery [phone]   Do not eat food :After Midnight.   May have liquids until 4:30 AM day of surgery  CLEAR LIQUID DIET  Foods Allowed                                                                     Foods Excluded  Water, Black Coffee (no milk/no creamer) and tea, regular and decaf                              liquids that you cannot  Plain Jell-O in any flavor  (No red)                         see through such as: Fruit ices (not with fruit pulp)                                 milk, soups, orange juice  Iced Popsicles (No red)                                    All solid food                             Apple juices Sports drinks like Gatorade (No red) Lightly seasoned clear broth or consume(fat free) Sugar Sample Menu Breakfast                                Lunch                                     Supper Cranberry juice                    Beef broth                            Chicken broth Jell-O  Grape juice                           Apple juice Coffee or tea                        Jell-O                                      Popsicle                                                 Coffee or tea                        Coffee or tea      Drink 2 G2 drinks the night before surgery complete by 10 PM.    Complete one G2 drink the morning of surgery 3 hours prior to scheduled surgery at 4:30 AM.     The day of surgery:  Drink ONE (1) Pre-Surgery G2 the morning of surgery. Drink in one sitting. Do not sip.  This drink was given to you during your hospital  pre-op appointment visit. Nothing else to drink after completing the Pre-Surgery G2.          If you have questions, please contact your surgeon's office.     Oral Hygiene is also important to reduce your risk of infection.                                    Remember - BRUSH YOUR TEETH THE MORNING OF SURGERY WITH YOUR REGULAR TOOTHPASTE   Do NOT smoke after Midnight  Take these medicines the morning of surgery with A SIP OF WATER: Tylenol, Allopurinol, Amlodipine, Atorvastatin, Methadone, Metoprolol, Tamoxifen.  Oxycodone if needed   Stop all vitamins and herbal supplements a week before surgery             You may not have any metal on your body including hair pins, jewelry, and body piercing             Do not wear make-up, lotions, powders, perfumes or deodorant  Do not wear nail polish including gel and S&S, artificial/acrylic nails, or any other type of covering on natural nails including finger and toenails. If you have artificial nails, gel coating, etc. that needs to be removed by a nail salon please have this removed prior to surgery or surgery may need to be canceled/ delayed if the surgeon/ anesthesia feels like they are unable to be safely monitored.   Do not shave  48 hours prior to surgery.           Do not bring valuables to the hospital. [place].   Contacts, dentures or bridgework may not be worn into surgery.    Patients discharged the day of surgery will not be allowed to drive home.  Special Instructions: Bring a copy of your healthcare  power of attorney and living will documents the day of surgery if you haven't scanned them in before.  Please read over the following fact sheets you  were given: IF YOU HAVE QUESTIONS ABOUT YOUR PRE OP INSTRUCTIONS PLEASE CALL [phone]   [facility] - Preparing for Surgery Before surgery, you can play an important role.  Because skin is not sterile, your skin needs to be as free of germs as possible.  You can reduce the number of germs on your skin by washing with CHG (chlorahexidine gluconate) soap before surgery.  CHG is an antiseptic cleaner which kills germs and bonds with the skin to continue killing germs even after washing. Please DO NOT use if you have an allergy to CHG or antibacterial soaps.  If your skin becomes reddened/irritated stop using the CHG and inform your nurse when you arrive at Short Stay. Do not shave (including legs and underarms) for at least 48 hours prior to the first CHG shower.  You may shave your face/neck.  Please follow these instructions carefully:  1.  Shower with CHG Soap the night before surgery and the  morning of surgery.  2.  If you choose to wash your hair, wash your hair first as usual with your normal  shampoo.  3.  After you shampoo, rinse your hair and body thoroughly to remove the shampoo.                             4.  Use CHG as you would any other liquid soap.  You can apply chg directly to the skin and wash.  Gently with a scrungie or clean washcloth.  5.  Apply the CHG Soap to your body ONLY FROM THE NECK DOWN.   Do   not use on face/ open                           Wound or open sores. Avoid contact with eyes, ears mouth and   genitals (private parts).                       Wash face,  Genitals (private parts) with your normal soap.             6.  Wash thoroughly, paying special attention to the area where your    surgery  will be performed.  7.  Thoroughly rinse your body with warm water from the neck down.  8.  DO NOT shower/wash with your  normal soap after using and rinsing off the CHG Soap.                9.  Pat yourself dry with a clean towel.            10.  Wear clean pajamas.            11.  Place clean sheets on your bed the night of your first shower and do not  sleep with pets. Day of Surgery : Do not apply any lotions/deodorants the morning of surgery.  Please wear clean clothes to the hospital/surgery center.  FAILURE TO FOLLOW THESE INSTRUCTIONS MAY RESULT IN THE CANCELLATION OF YOUR SURGERY  PATIENT SIGNATURE_________________________________  NURSE SIGNATURE__________________________________  ________________________________________________________________________  WHAT IS A BLOOD TRANSFUSION? Blood Transfusion Information  A transfusion is the replacement of blood or some of its parts. Blood is made up of multiple cells which provide different functions. Red blood cells carry oxygen and are used for blood loss replacement. White blood cells fight against infection. Platelets control bleeding. Plasma helps  clot blood. Other blood products are available for specialized needs, such as hemophilia or other clotting disorders. BEFORE THE TRANSFUSION  Who gives blood for transfusions?  Healthy volunteers who are fully evaluated to make sure their blood is safe. This is blood bank blood. Transfusion therapy is the safest it has ever been in the practice of medicine. Before blood is taken from a donor, a complete history is taken to make sure that person has no history of diseases nor engages in risky social behavior (examples are intravenous drug use or sexual activity with multiple partners). The donor's travel history is screened to minimize risk of transmitting infections, such as malaria. The donated blood is tested for signs of infectious diseases, such as HIV and hepatitis. The blood is then tested to be sure it is compatible with you in order to minimize the chance of a transfusion reaction. If you or a relative  donates blood, this is often done in anticipation of surgery and is not appropriate for emergency situations. It takes many days to process the donated blood. RISKS AND COMPLICATIONS Although transfusion therapy is very safe and saves many lives, the main dangers of transfusion include:  Getting an infectious disease. Developing a transfusion reaction. This is an allergic reaction to something in the blood you were given. Every precaution is taken to prevent this. The decision to have a blood transfusion has been considered carefully by your caregiver before blood is given. Blood is not given unless the benefits outweigh the risks. AFTER THE TRANSFUSION Right after receiving a blood transfusion, you will usually feel much better and more energetic. This is especially true if your red blood cells have gotten low (anemic). The transfusion raises the level of the red blood cells which carry oxygen, and this usually causes an energy increase. The nurse administering the transfusion will monitor you carefully for complications. HOME CARE INSTRUCTIONS  No special instructions are needed after a transfusion. You may find your energy is better. Speak with your caregiver about any limitations on activity for underlying diseases you may have. SEEK MEDICAL CARE IF:  Your condition is not improving after your transfusion. You develop redness or irritation at the intravenous (IV) site. SEEK IMMEDIATE MEDICAL CARE IF:  Any of the following symptoms occur over the next 12 hours: Shaking chills. You have a temperature by mouth above 102 F (38.9 C), not controlled by medicine. Chest, back, or muscle pain. People around you feel you are not acting correctly or are confused. Shortness of breath or difficulty breathing. Dizziness and fainting. You get a rash or develop hives. You have a decrease in urine output. Your urine turns a dark color or changes to pink, red, or brown. Any of the following symptoms  occur over the next 10 days: You have a temperature by mouth above 102 F (38.9 C), not controlled by medicine. Shortness of breath. Weakness after normal activity. The white part of the eye turns yellow (jaundice). You have a decrease in the amount of urine or are urinating less often. Your urine turns a dark color or changes to pink, red, or brown. Document Released: 12/03/2000 Document Revised: 02/28/2012 Document Reviewed: 07/22/2008 [facility] Patient Information 2014 [place], [place].  _______________________________________________________________________

## 2021-11-25 NOTE — Telephone Encounter (Signed)
Patient was a no-show for preop clearance appointment today 11/25/21.  She was last seen by our office 05/2020 and as she is >1 year from last appointment will require office visit to grant clearance.   Will route to preop call back team to assist rescheduling patient appointment.   Will route to surgical team so they are aware.   Loel Dubonnet, NP
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
## 2021-11-26 ENCOUNTER — Telehealth: Payer: Self-pay | Admitting: *Deleted

## 2021-11-26 ENCOUNTER — Inpatient Hospital Stay: Payer: Medicaid Other | Attending: Oncology

## 2021-11-26 ENCOUNTER — Encounter: Payer: Self-pay | Admitting: Oncology

## 2021-11-26 ENCOUNTER — Encounter (HOSPITAL_COMMUNITY)
Admission: RE | Admit: 2021-11-26 | Discharge: 2021-11-26 | Disposition: A | Discharge status: Home / Self Care | Payer: Medicaid Other | Source: Ambulatory Visit | Attending: Anesthesiology | Admitting: Anesthesiology

## 2021-11-26 ENCOUNTER — Inpatient Hospital Stay (HOSPITAL_BASED_OUTPATIENT_CLINIC_OR_DEPARTMENT_OTHER): Payer: Medicaid Other | Admitting: Oncology

## 2021-11-26 ENCOUNTER — Telehealth: Payer: Self-pay

## 2021-11-26 DIAGNOSIS — C50811 Malignant neoplasm of overlapping sites of right female breast: Secondary | ICD-10-CM

## 2021-11-26 DIAGNOSIS — S8262XA Displaced fracture of lateral malleolus of left fibula, initial encounter for closed fracture: Secondary | ICD-10-CM | POA: Diagnosis not present

## 2021-11-26 DIAGNOSIS — R7303 Prediabetes: Secondary | ICD-10-CM

## 2021-11-26 NOTE — Progress Notes (Signed)
Patient was a no show for PAT appointment today.  Attempted to contact x2 with no return call.  Dr. Charisse March office notified.

## 2021-11-26 NOTE — Telephone Encounter (Signed)
Called and left the patient a message to call the office about her missed appt with the cardiology office for her medical clearance

## 2021-11-26 NOTE — Telephone Encounter (Signed)
Covering preop app box today. This msg was no longer in APP box as she has been deemed needing appt for clearance. Just needs this to be r/s since last OV >1 year ago. Thank you.

## 2021-11-26 NOTE — Telephone Encounter (Signed)
Attempted to reach patient regarding her missed cardiology appointment and clearance for surgery. Left message requesting return call.

## 2021-11-27 ENCOUNTER — Telehealth: Payer: Self-pay

## 2021-11-27 DIAGNOSIS — S8262XA Displaced fracture of lateral malleolus of left fibula, initial encounter for closed fracture: Secondary | ICD-10-CM | POA: Diagnosis not present

## 2021-11-27 NOTE — Telephone Encounter (Signed)
Left message for the pt that she is going to need to call the office to reschedule her appt for pre op clearance. I will update the surgeon that pt did not show for her appt and that we have left a message for her to reschedule.

## 2021-11-27 NOTE — Telephone Encounter (Signed)
Received call from Ms. Guest this afternoon. Patient reports she was not feeling well yesterday and took coricidin hbp and slept until 8pm. Patient states she missed her appointment with Dr. [name] and Pre-admission testing and knows her surgery is probably cancelled. Patient also missed her cardiology appointment for clearance for surgery.  Instructed patient to contact the cardiology office and schedule her appointment. She will call our office once that appointment is scheduled so surgical dates can be discussed and another pre-admission testing appointment will be setup. Patient verbalized understanding. Transferred patient call to scheduling to re-schedule her appointment with Dr. [name]. Instructed to call with questions or concerns.

## 2021-11-28 DIAGNOSIS — S8262XA Displaced fracture of lateral malleolus of left fibula, initial encounter for closed fracture: Secondary | ICD-10-CM | POA: Diagnosis not present

## 2021-11-29 DIAGNOSIS — S8262XA Displaced fracture of lateral malleolus of left fibula, initial encounter for closed fracture: Secondary | ICD-10-CM | POA: Diagnosis not present

## 2021-11-30 ENCOUNTER — Telehealth: Payer: Self-pay | Admitting: Oncology

## 2021-11-30 DIAGNOSIS — S8262XA Displaced fracture of lateral malleolus of left fibula, initial encounter for closed fracture: Secondary | ICD-10-CM | POA: Diagnosis not present

## 2021-11-30 NOTE — Telephone Encounter (Signed)
Scheduled per sch msg. Called and spoke with patient. Confirmed appt  

## 2021-12-01 ENCOUNTER — Ambulatory Visit: Admit: 2021-12-01 | Payer: Medicaid Other | Admitting: Gynecologic Oncology

## 2021-12-01 DIAGNOSIS — S8262XA Displaced fracture of lateral malleolus of left fibula, initial encounter for closed fracture: Secondary | ICD-10-CM | POA: Diagnosis not present

## 2021-12-01 DIAGNOSIS — N8502 Endometrial intraepithelial neoplasia [EIN]: Secondary | ICD-10-CM

## 2021-12-01 SURGERY — BIOPSY, LYMPH NODE, SENTINEL
Anesthesia: General

## 2021-12-02 ENCOUNTER — Encounter: Payer: Medicaid Other | Admitting: Rehabilitation

## 2021-12-02 ENCOUNTER — Telehealth: Payer: Self-pay

## 2021-12-02 DIAGNOSIS — S8262XA Displaced fracture of lateral malleolus of left fibula, initial encounter for closed fracture: Secondary | ICD-10-CM | POA: Diagnosis not present

## 2021-12-02 NOTE — Telephone Encounter (Signed)
Returning call to [name]. Patient inquiring what she needs to do to have her surgery rescheduled. Instructed patient that she needs to schedule an appointment with her cardiologist to make sure she is cleared for surgery. Advised patient to call our office once she knows when her cardiology appointment is so we can work on rescheduling her surgery. Patient verbalized understanding.

## 2021-12-03 ENCOUNTER — Telehealth: Payer: Self-pay

## 2021-12-03 DIAGNOSIS — S8262XA Displaced fracture of lateral malleolus of left fibula, initial encounter for closed fracture: Secondary | ICD-10-CM | POA: Diagnosis not present

## 2021-12-03 NOTE — Telephone Encounter (Signed)
Returning call to patient. Patient left a voicemail stating she has made an appointment with her cardiology office on 01/01/22 at 08:50 and would like to get her surgery scheduled with Dr. Berline Lopes.  Left message requesting return call.

## 2021-12-04 ENCOUNTER — Encounter: Payer: Medicaid Other | Admitting: Physical Therapy

## 2021-12-04 DIAGNOSIS — S8262XA Displaced fracture of lateral malleolus of left fibula, initial encounter for closed fracture: Secondary | ICD-10-CM | POA: Diagnosis not present

## 2021-12-05 DIAGNOSIS — S8262XA Displaced fracture of lateral malleolus of left fibula, initial encounter for closed fracture: Secondary | ICD-10-CM | POA: Diagnosis not present

## 2021-12-06 DIAGNOSIS — S8262XA Displaced fracture of lateral malleolus of left fibula, initial encounter for closed fracture: Secondary | ICD-10-CM | POA: Diagnosis not present

## 2021-12-07 ENCOUNTER — Encounter: Payer: Medicaid Other | Admitting: Gynecologic Oncology

## 2021-12-07 DIAGNOSIS — S8262XA Displaced fracture of lateral malleolus of left fibula, initial encounter for closed fracture: Secondary | ICD-10-CM | POA: Diagnosis not present

## 2021-12-08 ENCOUNTER — Inpatient Hospital Stay: Payer: Medicaid Other

## 2021-12-08 ENCOUNTER — Telehealth: Payer: Medicaid Other | Admitting: Gynecologic Oncology

## 2021-12-08 ENCOUNTER — Inpatient Hospital Stay: Payer: Medicaid Other | Admitting: Adult Health

## 2021-12-08 DIAGNOSIS — S8262XA Displaced fracture of lateral malleolus of left fibula, initial encounter for closed fracture: Secondary | ICD-10-CM | POA: Diagnosis not present

## 2021-12-08 NOTE — Telephone Encounter (Signed)
Following up with [name] this afternoon. Let patient know our office received her voicemail with her cardiology appointment details. Her surgery with Dr. Berline Lopes is tentatively scheduled for 01/19/22. Patient verbalized understanding. Instructed to call with any needs.

## 2021-12-09 DIAGNOSIS — S8262XA Displaced fracture of lateral malleolus of left fibula, initial encounter for closed fracture: Secondary | ICD-10-CM | POA: Diagnosis not present

## 2021-12-10 DIAGNOSIS — S8262XA Displaced fracture of lateral malleolus of left fibula, initial encounter for closed fracture: Secondary | ICD-10-CM | POA: Diagnosis not present

## 2021-12-11 DIAGNOSIS — S8262XA Displaced fracture of lateral malleolus of left fibula, initial encounter for closed fracture: Secondary | ICD-10-CM | POA: Diagnosis not present

## 2021-12-12 DIAGNOSIS — S8262XA Displaced fracture of lateral malleolus of left fibula, initial encounter for closed fracture: Secondary | ICD-10-CM | POA: Diagnosis not present

## 2021-12-13 DIAGNOSIS — S8262XA Displaced fracture of lateral malleolus of left fibula, initial encounter for closed fracture: Secondary | ICD-10-CM | POA: Diagnosis not present

## 2021-12-14 DIAGNOSIS — S8262XA Displaced fracture of lateral malleolus of left fibula, initial encounter for closed fracture: Secondary | ICD-10-CM | POA: Diagnosis not present

## 2021-12-15 ENCOUNTER — Ambulatory Visit: Payer: Medicaid Other

## 2021-12-15 DIAGNOSIS — S8262XA Displaced fracture of lateral malleolus of left fibula, initial encounter for closed fracture: Secondary | ICD-10-CM | POA: Diagnosis not present

## 2021-12-16 ENCOUNTER — Telehealth: Payer: Self-pay | Admitting: *Deleted

## 2021-12-16 ENCOUNTER — Other Ambulatory Visit: Payer: Self-pay | Admitting: Gynecologic Oncology

## 2021-12-16 ENCOUNTER — Telehealth: Payer: Self-pay

## 2021-12-16 DIAGNOSIS — N8502 Endometrial intraepithelial neoplasia [EIN]: Secondary | ICD-10-CM

## 2021-12-16 DIAGNOSIS — S8262XA Displaced fracture of lateral malleolus of left fibula, initial encounter for closed fracture: Secondary | ICD-10-CM | POA: Diagnosis not present

## 2021-12-16 MED ORDER — MEDROXYPROGESTERONE ACETATE 10 MG PO TABS: 10.0000 mg | ORAL_TABLET | Freq: Every day | ORAL | 1 refills | Status: DC

## 2021-12-16 NOTE — Telephone Encounter (Signed)
Fax surgical optimization form and records to patient's PCP and cardiology

## 2021-12-16 NOTE — Progress Notes (Signed)
See RN note. Provera 10 mg once daily sent in per Dr. Berline Lopes to treat her CAH while we await cardiac evaluation prior to surgery.

## 2021-12-16 NOTE — Telephone Encounter (Signed)
Spoke with [name] this afternoon. Advised patient that Dr. Berline Lopes would like her to begin taking Provera 10mg  daily. This will treat the lining while we wait for cardiac evaluation and surgery on 01/19/22.  Advised patient to monitor for blood clots when starting medication (Pain, redness, swelling in the back of legs or warm to touch. Shortness of breath or chest pain, seek medical care). Patient verbalized understanding. Instructed to call with any questions or concerns.

## 2021-12-17 ENCOUNTER — Encounter: Payer: Medicaid Other | Admitting: Rehabilitation

## 2021-12-17 DIAGNOSIS — S8262XA Displaced fracture of lateral malleolus of left fibula, initial encounter for closed fracture: Secondary | ICD-10-CM | POA: Diagnosis not present

## 2021-12-18 ENCOUNTER — Other Ambulatory Visit: Payer: Medicaid Other

## 2021-12-18 ENCOUNTER — Ambulatory Visit: Payer: Medicaid Other | Admitting: Adult Health

## 2021-12-18 DIAGNOSIS — S8262XA Displaced fracture of lateral malleolus of left fibula, initial encounter for closed fracture: Secondary | ICD-10-CM | POA: Diagnosis not present

## 2021-12-19 DIAGNOSIS — S8262XA Displaced fracture of lateral malleolus of left fibula, initial encounter for closed fracture: Secondary | ICD-10-CM | POA: Diagnosis not present

## 2021-12-20 DIAGNOSIS — W19XXXA Unspecified fall, initial encounter: Secondary | ICD-10-CM | POA: Diagnosis not present

## 2021-12-20 DIAGNOSIS — R609 Edema, unspecified: Secondary | ICD-10-CM | POA: Diagnosis not present

## 2021-12-20 DIAGNOSIS — S8262XA Displaced fracture of lateral malleolus of left fibula, initial encounter for closed fracture: Secondary | ICD-10-CM | POA: Diagnosis not present

## 2021-12-21 ENCOUNTER — Encounter (HOSPITAL_COMMUNITY): Payer: Self-pay

## 2021-12-21 ENCOUNTER — Other Ambulatory Visit: Payer: Self-pay | Admitting: Oncology

## 2021-12-21 ENCOUNTER — Emergency Department (HOSPITAL_COMMUNITY): Payer: Medicaid Other

## 2021-12-21 ENCOUNTER — Emergency Department (HOSPITAL_COMMUNITY)
Admission: EM | Admit: 2021-12-21 | Discharge: 2021-12-21 | Disposition: A | Discharge status: Home / Self Care | Payer: Medicaid Other | Attending: Emergency Medicine | Admitting: Emergency Medicine

## 2021-12-21 ENCOUNTER — Other Ambulatory Visit (HOSPITAL_COMMUNITY): Payer: Self-pay

## 2021-12-21 DIAGNOSIS — M25462 Effusion, left knee: Secondary | ICD-10-CM

## 2021-12-21 DIAGNOSIS — R600 Localized edema: Secondary | ICD-10-CM | POA: Diagnosis not present

## 2021-12-21 DIAGNOSIS — Z79899 Other long term (current) drug therapy: Secondary | ICD-10-CM | POA: Diagnosis not present

## 2021-12-21 DIAGNOSIS — Z853 Personal history of malignant neoplasm of breast: Secondary | ICD-10-CM | POA: Insufficient documentation

## 2021-12-21 DIAGNOSIS — M25562 Pain in left knee: Secondary | ICD-10-CM | POA: Diagnosis not present

## 2021-12-21 DIAGNOSIS — Z7982 Long term (current) use of aspirin: Secondary | ICD-10-CM | POA: Diagnosis not present

## 2021-12-21 DIAGNOSIS — C50412 Malignant neoplasm of upper-outer quadrant of left female breast: Secondary | ICD-10-CM

## 2021-12-21 DIAGNOSIS — W010XXA Fall on same level from slipping, tripping and stumbling without subsequent striking against object, initial encounter: Secondary | ICD-10-CM | POA: Diagnosis not present

## 2021-12-21 DIAGNOSIS — W19XXXA Unspecified fall, initial encounter: Secondary | ICD-10-CM

## 2021-12-21 DIAGNOSIS — R102 Pelvic and perineal pain: Secondary | ICD-10-CM | POA: Diagnosis not present

## 2021-12-21 DIAGNOSIS — I10 Essential (primary) hypertension: Secondary | ICD-10-CM | POA: Insufficient documentation

## 2021-12-21 DIAGNOSIS — M25512 Pain in left shoulder: Secondary | ICD-10-CM | POA: Diagnosis not present

## 2021-12-21 DIAGNOSIS — M25561 Pain in right knee: Secondary | ICD-10-CM | POA: Diagnosis not present

## 2021-12-21 MED ORDER — OXYCODONE-ACETAMINOPHEN 5-325 MG PO TABS
2.0000 | ORAL_TABLET | Freq: Once | ORAL | Status: AC
  Administered 2021-12-21: 2 via ORAL
  Filled 2021-12-21: qty 2

## 2021-12-21 MED ORDER — METHADONE HCL 5 MG PO TABS
15.0000 mg | ORAL_TABLET | Freq: Three times a day (TID) | ORAL | 0 refills | Status: DC | PRN
  Filled 2021-12-21: qty 270, 30d supply, fill #0

## 2021-12-21 NOTE — ED Provider Notes (Signed)
[facility] DEPT Provider Note   CSN: [id number] Arrival date & time: 12/21/21  0010     History  Chief Complaint  Patient presents with   [name] is a 61 y.o. female. 61 year old female history of hypertension, chronic pain, STEMI, breast cancer, status post bilateral mastectomy presents today complaining of fall last night.  He describes the event as a trip and fall.  States that she fell forward.  She is complaining of diffuse pain.  She denies striking her head, chronic anticoagulation, headache, or neck pain.  She reports she was at home alone at this time.  She was able to roll her sliding get up.  She called EMS and states she was on the porch waiting for them.  She was transported here via EMS.  She denies any treatment.  EMS run sheet reviewed she complained of knee pain, leg pain, back pain.  They noted some hypertension but otherwise vital signs within normal limits.  Reports she was standing on her fresh reports upon their arrival.  She reported that she had locked herself out of her house and fell in the grass 30 minutes prior to their arrival.  No treatment is noted I reviewed her last oncology note December 8 with Dr. [name]. At that time was noted that she was a daily smoker, no alcohol use noted, no current illicit drug use.          Home Medications Prior to Admission medications   Medication Sig Start Date End Date Taking? Authorizing Provider  acetaminophen (TYLENOL) 500 MG tablet Take 2 tablets (1,000 mg total) by mouth every 8 (eight) hours as needed. 03/14/18   Carlyle Dolly, MD  allopurinol (ZYLOPRIM) 300 MG tablet TAKE 1 TABLET BY MOUTH EVERY DAY 10/19/21   Gladys Damme, MD  amLODipine (NORVASC) 5 MG tablet Take 1 tablet (5 mg total) by mouth daily. 04/23/21   [name], MD  ascorbic acid (VITAMIN C) 500 MG tablet Take 500 mg by mouth daily.    [provider]  aspirin EC 81 MG tablet Take 81  mg by mouth daily.    [provider]  atorvastatin (LIPITOR) 40 MG tablet Take 1 tablet (40 mg total) by mouth daily at 6 PM. 04/21/21   [name], MD  camphor-menthol [facility]) lotion Apply topically as needed for itching. 07/17/19   Eugenie Filler, MD  cholecalciferol (VITAMIN D3) 25 MCG (1000 UNIT) tablet Take 1,000 Units by mouth daily.    [provider]  ELDERBERRY PO Take 1,000 mg by mouth daily.    [provider]  Ginkgo Biloba 120 MG TABS Take 120 mg by mouth daily.    [provider]  ibuprofen (ADVIL) 800 MG tablet Take 1 tablet (800 mg total) by mouth every 8 (eight) hours as needed for moderate pain. For AFTER surgery only 10/26/21   Joylene John D, NP  isosorbide mononitrate (IMDUR) 60 MG 24 hr tablet Take 1 tablet (60 mg total) by mouth at bedtime. 04/21/21   [name], MD  KLOR-CON M20 20 MEQ tablet TAKE 1 TABLET BY MOUTH 3 TIMES DAILY 10/19/21   Gladys Damme, MD  medroxyPROGESTERone (PROVERA) 10 MG tablet Take 1 tablet (10 mg total) by mouth daily. 12/16/21   Joylene John D, NP  meloxicam (MOBIC) 15 MG tablet Take 1 tablet (15 mg total) by mouth daily. 10/20/21   Gladys Damme, MD  methadone (DOLOPHINE) 5 MG tablet Take  3 tablets by mouth every 8  hours as needed. 11/23/21 05/22/22  [place]rin[place], Virgie Dad, MD  metoprolol succinate (TOPROL-XL) 50 MG 24 hr tablet TAKE 2 TABLETS (100 MG TOTAL) BY MOUTH DAILY. TAKE WITH OR IMMEDIATELY FOLLOWING A MEAL. 10/19/21   Gladys Damme, MD  nitroGLYCERIN (NITROSTAT) 0.4 MG SL tablet Place 1 tablet (0.4 mg total) under the tongue every 5 (five) minutes as needed for chest pain. 02/08/18   Kathlen Mody, Scott T, PA-C  OVER THE COUNTER MEDICATION Take 30 mg by mouth once a week. Zinc    [provider]  oxyCODONE (OXY IR/ROXICODONE) 5 MG immediate release tablet Take 1-2 tablets (5-10 mg total) by mouth every 6 (six) hours as needed for severe pain. Patient not taking: Reported on 11/25/2021 04/08/21    Stark Klein, MD  senna-docusate (SENOKOT-S) 8.6-50 MG tablet Take 2 tablets by mouth at bedtime. For AFTER surgery, do not take if having diarrhea 10/26/21   Joylene John D, NP  tamoxifen (NOLVADEX) 20 MG tablet Take 1 tablet (20 mg total) by mouth daily. 02/09/21   [place], Virgie Dad, MD  famotidine (PEPCID) 20 MG tablet Take 20 mg by mouth 2 (two) times daily.    03/10/12  [provider]      Allergies    Patient has no known allergies.    Review of Systems   Review of Systems  All other systems reviewed and are negative.  Physical Exam Updated Vital Signs BP (!) 156/90 (BP Location: Left Arm)    Pulse 69    Temp 97.9 F (36.6 C) (Oral)    Resp 18    LMP 08/07/2011    SpO2 94%  Physical Exam Vitals and nursing note reviewed.  Constitutional:      General: She is not in acute distress.    Appearance: Normal appearance. She is obese.  HENT:     Head: Normocephalic.     Right Ear: External ear normal.     Left Ear: External ear normal.     Nose: Nose normal.  Eyes:     Extraocular Movements: Extraocular movements intact.     Pupils: Pupils are equal, round, and reactive to light.  Neck:     Comments: Visual inspection of neck reveals no external signs of trauma anteriorly or posteriorly There is diffuse tenderness to palpation both over the spine and anteriorly Cardiovascular:     Rate and Rhythm: Normal rate and regular rhythm.     Comments: Heart is regular rate and rhythm Pulmonary:     Effort: Pulmonary effort is normal.     Breath sounds: Normal breath sounds.     Comments: Chest wall significant for bilateral mastectomies No external signs of trauma no crepitus noted There is diffuse tenderness palpation throughout chest Abdominal:     General: Abdomen is flat. Bowel sounds are normal.     Palpations: Abdomen is soft.     Comments: No external signs of trauma are noted There is diffuse tenderness palpation throughout abdomen  Musculoskeletal:      Cervical back: Normal range of motion and neck supple.     Comments: Diffuse tenderness palpation of thoracic and lumbar spine as well as diffusely throughout her back There is diffuse tenderness palpation left upper extremity shoulder down through elbow There is no obvious signs of deformity or trauma on her upper extremities or lower extremities Bilateral lower extremities appear to have stable pelvis She complains of pain with palpation or movement of his knees  There are no signs of trauma externally throughout lower extremities Pulses are intact  Skin:    General: Skin is warm and dry.     Capillary Refill: Capillary refill takes less than 2 seconds.  Neurological:     General: No focal deficit present.     Mental Status: She is alert.  Psychiatric:        Mood and Affect: Mood normal.    ED Results / Procedures / Treatments   Labs (all labs ordered are listed, but only abnormal results are displayed) Labs Reviewed - No data to display  EKG None  Radiology DG Chest 2 View  Result Date: 12/21/2021 CLINICAL DATA:  Fall last night, complains of generalized bilat knee, hip, lt shoulder lt elbow pain EXAM: CHEST - 2 VIEW COMPARISON:  09/27/2019 FINDINGS: Cardiac silhouette is normal in size. No mediastinal or hilar masses. No evidence of adenopathy. Clear lungs.  No pleural effusion or pneumothorax. Skeletal structures are intact. Stable changes from left breast surgery. IMPRESSION: No active cardiopulmonary disease. Electronically Signed   By: Lajean Manes M.D.   On: 12/21/2021 09:06   DG Pelvis 1-2 Views  Result Date: 12/21/2021 CLINICAL DATA:  Fall last night, complains of generalized bilat knee, hip, lt shoulder lt elbow pain EXAM: PELVIS - 1-2 VIEW COMPARISON:  None. FINDINGS: No fracture or bone lesion. Hip joints, SI joints and pubic symphysis are normally aligned. Dense femoral and iliac artery vascular calcifications. Soft tissues otherwise unremarkable. IMPRESSION: No  fracture or dislocation. Electronically Signed   By: Lajean Manes M.D.   On: 12/21/2021 09:06   DG Elbow Complete Left  Result Date: 12/21/2021 CLINICAL DATA:  Fall last night, complains of generalized bilat knee, hip, lt shoulder lt elbow pain EXAM: LEFT ELBOW - COMPLETE 3+ VIEW COMPARISON:  08/11/2020 FINDINGS: No fracture or bone lesion. Elbow joint normally spaced and aligned.  No joint effusion. Mild posterior subcutaneous soft tissue edema. IMPRESSION: No fracture or dislocation. Electronically Signed   By: Lajean Manes M.D.   On: 12/21/2021 09:05   DG Shoulder Left  Result Date: 12/21/2021 CLINICAL DATA:  Fall last night, complains of generalized bilat knee, hip, lt shoulder lt elbow pain EXAM: LEFT SHOULDER - 2+ VIEW COMPARISON:  None. FINDINGS: No fracture or bone lesion. Narrowed glenohumeral joint with marginal osteophytes from the inferior glenoid and humeral head. AC joint normally spaced and aligned. Surgical vascular clips lie along the left axilla. IMPRESSION: 1. No fracture or dislocation. 2. Glenohumeral joint arthropathic changes. Electronically Signed   By: Lajean Manes M.D.   On: 12/21/2021 09:04   DG Knee Complete 4 Views Left  Result Date: 12/21/2021 CLINICAL DATA:  Left knee pain after fall EXAM: LEFT KNEE - COMPLETE 4+ VIEW COMPARISON:  11/06/2020 FINDINGS: Generalized degenerative marginal spurring with joint space narrowing greatest at the lateral compartment. Moderate knee joint effusion. No acute fracture or subluxation. IMPRESSION: 1. 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 2. Knee joint effusion. 3. Negative for fracture or malalignment. Electronically Signed   By: Jorje Guild M.D.   On: 12/21/2021 09:01   DG Knee Complete 4 Views Right  Result Date: 12/21/2021 CLINICAL DATA:  Fall last night, complains of generalized bilat knee, hip, lt shoulder lt elbow pain EXAM: RIGHT KNEE - COMPLETE 4+ VIEW COMPARISON:  11/06/2020. FINDINGS: No  fracture or bone lesion. Knee prosthetic components are well seated and aligned, stable from the prior study. No convincing joint effusion. IMPRESSION: 1. No fracture or acute finding.  No evidence of loosening of the orthopedic hardware. Electronically Signed   By: Lajean Manes M.D.   On: 12/21/2021 09:02    Procedures Procedures  Medications Ordered in ED Medications  oxyCODONE-acetaminophen (PERCOCET/ROXICET) 5-325 MG per tablet 2 tablet (2 tablets Oral Given 12/21/21 0856)    ED Course/ Medical Decision Making/ A&P Clinical Course as of 12/21/21 0923  Mon Dec 21, 2021  0918 Pelvis independently reviewed no evidence of fracture, radiology interpretation reviewed and concurs Chest x-Tannen Vandezande and pelvis reviewed no evidence of acute abnormality, radiology interpretation reviewed and concurs Left elbow reviewed no evidence of fracture on my interpretation, radiology interpretation reviewed and concurs Left shoulder x-Arrin Pintor reviewed and independently interpreted with no evidence of acute abnormality, radiology interpretation reviewed and concurs Left knee with some tricompartmental changes and effusion noted on my interpretation, radiology interpretation reviewed and concurs Right knee without any evidence of acute abnormality on my interpretation radiology interpretation concurs [DR]    Clinical Course User Index [DR] Pattricia Boss, MD                           Medical Decision Making  61 year old female presents today with mechanical fall.  She has multiple diffuse tender areas.  Multiple x-rays obtained to ensure that there were no definite fractures.  Chest x-Alonso Gapinski, pelvis, left upper extremity without any evidence of acute abnormalities.  Left knee with known DJD and possible effusion and Final Clinical Impression(s) / ED Diagnoses Final diagnoses:  Fall, initial encounter  Effusion of left knee    Rx / DC Orders ED Discharge Orders     None         Pattricia Boss, MD 12/21/21  [phone]

## 2021-12-21 NOTE — Discharge Instructions (Signed)
X-rays obtained after your fall revealed no evidence of any broken bones There is a small amount of swelling in the left knee.  You are given chills so that you can walk with minimizing pain.  Please keep knee elevated, use mild compression such as a light Ace wrap, cold therapy, and anti-inflammatory medication such as ibuprofen.  Recheck with your doctor this week.

## 2021-12-21 NOTE — ED Triage Notes (Signed)
Patient arrived via [facility] with complaints of generalized left sided pain after a mechanical fall tonight. Possible ETOH on board per EMS. No blood thinners. Denies hitting her head or LOC.

## 2021-12-22 ENCOUNTER — Other Ambulatory Visit (HOSPITAL_COMMUNITY): Payer: Self-pay

## 2021-12-22 ENCOUNTER — Telehealth: Payer: Self-pay

## 2021-12-22 ENCOUNTER — Encounter: Payer: Medicaid Other | Admitting: Rehabilitation

## 2021-12-22 NOTE — Telephone Encounter (Signed)
Transition Care Management Unsuccessful Follow-up Telephone Call  Date of discharge and from where:  12/21/2021 from [facility]  Attempts:  1st Attempt  Reason for unsuccessful TCM follow-up call:  Left voice message

## 2021-12-23 NOTE — Telephone Encounter (Signed)
Transition Care Management Unsuccessful Follow-up Telephone Call  Date of discharge and from where:  12/21/2021-[facility]  Attempts:  2nd Attempt  Reason for unsuccessful TCM follow-up call:  Left voice message

## 2021-12-23 NOTE — Telephone Encounter (Signed)
Pt has appt 01/01/22 with Laurann Montana, NP. Will add pre op clearance needed to appt notes. Will forward notes to NP for upcoming appt. Will send FYI to requesting office pt has appt 01/01/22.

## 2021-12-23 NOTE — Telephone Encounter (Signed)
° °  Pre-operative Risk Assessment    Patient Name: [name]  DOB: 1961-05-27 MRN: [id number]      Request for Surgical Clearance    Procedure:   Robotic assisted total laparoscope hysterectomy, bilateral salpingo-oophorectomy, sentinel lymph node biopsy, possible lymph node dissection, possible laparotomy  Date of Surgery:  Clearance 01/19/22                                 Surgeon:  Dr. Jeral Pinch  Surgeon's Group or Practice Name:  [facility]  Phone number:  [phone] Fax number:  [phone]   Type of Clearance Requested:   Medical and Pharmacy-Hold Aspirin    Type of Anesthesia:  General    Additional requests/questions:   Does patient need anticoagulants? "W/ stent given ,manjor surgery/"  Signed, Gerald Stabs   12/23/2021, 2:44 PM

## 2021-12-24 ENCOUNTER — Other Ambulatory Visit: Payer: Self-pay

## 2021-12-24 DIAGNOSIS — C50811 Malignant neoplasm of overlapping sites of right female breast: Secondary | ICD-10-CM

## 2021-12-24 NOTE — Telephone Encounter (Signed)
Transition Care Management Follow-up Telephone Call Date of discharge and from where: 12/21/2021 from [facility] How have you been since you were released from the hospital? Pt stated that she is starting to feel better and did not have any questions or concerns.  Any questions or concerns? No  Items Reviewed: Did the pt receive and understand the discharge instructions provided? Yes  Medications obtained and verified? Yes  Other? No  Any new allergies since your discharge? No  Dietary orders reviewed? No Do you have support at home? Yes   Functional Questionnaire: (I = Independent and D = Dependent) ADLs: I Bathing/Dressing- I Meal Prep- I Eating- I Maintaining continence- I Transferring/Ambulation- I Managing Meds- I   Follow up appointments reviewed:  PCP Hospital f/u appt confirmed? No   Specialist Hospital f/u appt confirmed? No   Are transportation arrangements needed? No  If their condition worsens, is the pt aware to call PCP or go to the Emergency Dept.? Yes Was the patient provided with contact information for the PCP's office or ED? Yes Was to pt encouraged to call back with questions or concerns? Yes

## 2021-12-25 ENCOUNTER — Inpatient Hospital Stay: Payer: Medicaid Other

## 2021-12-25 ENCOUNTER — Inpatient Hospital Stay: Payer: Medicaid Other | Admitting: Adult Health

## 2021-12-25 ENCOUNTER — Encounter: Payer: Self-pay | Admitting: Licensed Clinical Social Worker

## 2021-12-25 NOTE — Progress Notes (Signed)
[facility] CSW Progress Note  [facility] representative received TC from pt inquiring about applying for public housing. Requested that contact information for section 8/ [employer] be sent to her. CSW sent the information for [employer] and [employer] office.    Christeen Douglas , LCSW

## 2021-12-27 DIAGNOSIS — M170 Bilateral primary osteoarthritis of knee: Secondary | ICD-10-CM | POA: Diagnosis not present

## 2021-12-28 ENCOUNTER — Encounter: Payer: Medicaid Other | Admitting: Gynecologic Oncology

## 2021-12-28 ENCOUNTER — Ambulatory Visit: Payer: Medicaid Other | Admitting: Podiatry

## 2021-12-28 DIAGNOSIS — M170 Bilateral primary osteoarthritis of knee: Secondary | ICD-10-CM | POA: Diagnosis not present

## 2021-12-29 DIAGNOSIS — M170 Bilateral primary osteoarthritis of knee: Secondary | ICD-10-CM | POA: Diagnosis not present

## 2021-12-30 ENCOUNTER — Encounter: Payer: Medicaid Other | Admitting: Rehabilitation

## 2021-12-30 DIAGNOSIS — M170 Bilateral primary osteoarthritis of knee: Secondary | ICD-10-CM | POA: Diagnosis not present

## 2021-12-31 ENCOUNTER — Other Ambulatory Visit: Payer: Self-pay | Admitting: Gynecologic Oncology

## 2021-12-31 DIAGNOSIS — M170 Bilateral primary osteoarthritis of knee: Secondary | ICD-10-CM | POA: Diagnosis not present

## 2021-12-31 DIAGNOSIS — N8502 Endometrial intraepithelial neoplasia [EIN]: Secondary | ICD-10-CM
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
## 2022-01-01 ENCOUNTER — Ambulatory Visit (HOSPITAL_BASED_OUTPATIENT_CLINIC_OR_DEPARTMENT_OTHER): Payer: Medicaid Other | Admitting: Family

## 2022-01-01 ENCOUNTER — Encounter: Payer: Medicaid Other | Admitting: Rehabilitation

## 2022-01-01 DIAGNOSIS — M170 Bilateral primary osteoarthritis of knee: Secondary | ICD-10-CM | POA: Diagnosis not present

## 2022-01-01 NOTE — Telephone Encounter (Signed)
Patient was offered appointment 10/30/21 which she declined. She was a no show for her preop appointments both 11/25/21 and 01/01/22. She is >1 year from last office visit (last OV 05/2020). Unable to provide clearance until follow up visit completed.   Will route to surgical team so they are aware.   Loel Dubonnet, NP

## 2022-01-02 DIAGNOSIS — M170 Bilateral primary osteoarthritis of knee: Secondary | ICD-10-CM | POA: Diagnosis not present

## 2022-01-03 DIAGNOSIS — M170 Bilateral primary osteoarthritis of knee: Secondary | ICD-10-CM | POA: Diagnosis not present

## 2022-01-04 ENCOUNTER — Telehealth: Payer: Self-pay | Admitting: *Deleted

## 2022-01-04 DIAGNOSIS — M170 Bilateral primary osteoarthritis of knee: Secondary | ICD-10-CM | POA: Diagnosis not present

## 2022-01-04 NOTE — Telephone Encounter (Signed)
Per Dr Berline Lopes called and scheduled the patient for a telephone visit with Dr Berline Lopes. ADvised that the patient to reschedule her missed cardiology appt

## 2022-01-05 DIAGNOSIS — M170 Bilateral primary osteoarthritis of knee: Secondary | ICD-10-CM | POA: Diagnosis not present

## 2022-01-06 ENCOUNTER — Encounter: Payer: Self-pay | Admitting: Gynecologic Oncology

## 2022-01-06 ENCOUNTER — Telehealth: Payer: Self-pay

## 2022-01-06 ENCOUNTER — Ambulatory Visit: Payer: Medicaid Other | Admitting: Physical Therapy

## 2022-01-06 ENCOUNTER — Encounter: Payer: Medicaid Other | Admitting: Rehabilitation

## 2022-01-06 ENCOUNTER — Inpatient Hospital Stay: Payer: Medicaid Other | Attending: Oncology | Admitting: Gynecologic Oncology

## 2022-01-06 DIAGNOSIS — M170 Bilateral primary osteoarthritis of knee: Secondary | ICD-10-CM | POA: Diagnosis not present

## 2022-01-06 DIAGNOSIS — N8502 Endometrial intraepithelial neoplasia [EIN]: Secondary | ICD-10-CM | POA: Diagnosis not present
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
## 2022-01-06 NOTE — Telephone Encounter (Signed)
Anderson Malta RN case Museum/gallery curator line requesting a DME order for a shower chair.   Please let me know once this is placed and I will route to [employer] for processing.

## 2022-01-07 ENCOUNTER — Telehealth: Payer: Self-pay | Admitting: Adult Health

## 2022-01-07 DIAGNOSIS — M170 Bilateral primary osteoarthritis of knee: Secondary | ICD-10-CM | POA: Diagnosis not present

## 2022-01-07 NOTE — Telephone Encounter (Signed)
Sch per 1/19 inbasket, left msg

## 2022-01-08 ENCOUNTER — Telehealth: Payer: Self-pay

## 2022-01-08 ENCOUNTER — Other Ambulatory Visit: Payer: Self-pay | Admitting: Family Medicine

## 2022-01-08 ENCOUNTER — Ambulatory Visit: Payer: Medicaid Other | Admitting: Physical Therapy

## 2022-01-08 DIAGNOSIS — M170 Bilateral primary osteoarthritis of knee: Secondary | ICD-10-CM | POA: Diagnosis not present

## 2022-01-08 DIAGNOSIS — M1990 Unspecified osteoarthritis, unspecified site: Secondary | ICD-10-CM

## 2022-01-08 DIAGNOSIS — N8502 Endometrial intraepithelial neoplasia [EIN]: Secondary | ICD-10-CM

## 2022-01-08 NOTE — Telephone Encounter (Signed)
Attempted to reach [name] regarding surgery scheduling. Unable to contact patient. Left message requesting return call.

## 2022-01-08 NOTE — Telephone Encounter (Signed)
I have placed this order thank you

## 2022-01-09 DIAGNOSIS — M170 Bilateral primary osteoarthritis of knee: Secondary | ICD-10-CM | POA: Diagnosis not present

## 2022-01-10 DIAGNOSIS — M170 Bilateral primary osteoarthritis of knee: Secondary | ICD-10-CM | POA: Diagnosis not present

## 2022-01-11 ENCOUNTER — Other Ambulatory Visit: Payer: Self-pay | Admitting: Gynecologic Oncology

## 2022-01-11 ENCOUNTER — Ambulatory Visit: Payer: Medicaid Other | Admitting: Adult Health

## 2022-01-11 ENCOUNTER — Encounter (HOSPITAL_COMMUNITY): Payer: Medicaid Other

## 2022-01-11 ENCOUNTER — Other Ambulatory Visit: Payer: Medicaid Other

## 2022-01-11 DIAGNOSIS — M170 Bilateral primary osteoarthritis of knee: Secondary | ICD-10-CM | POA: Diagnosis not present

## 2022-01-11 DIAGNOSIS — N8502 Endometrial intraepithelial neoplasia [EIN]: Secondary | ICD-10-CM

## 2022-01-11 NOTE — Telephone Encounter (Signed)
Attempted to reach Ms. Jeneen Rinks regarding surgery scheduling. Unable to contact patient. Left message requesting return call.

## 2022-01-11 NOTE — Telephone Encounter (Signed)
Community message sent to [employer]. Will await response.   Rayya Yagi C Nimai Burbach, RN  

## 2022-01-12 ENCOUNTER — Other Ambulatory Visit: Payer: Self-pay | Admitting: Family Medicine

## 2022-01-12 DIAGNOSIS — M170 Bilateral primary osteoarthritis of knee: Secondary | ICD-10-CM | POA: Diagnosis not present

## 2022-01-12 NOTE — Telephone Encounter (Signed)
Attempted to reach [name] regarding surgery scheduling. Unable to contact patient. Left message requesting return call.

## 2022-01-13 ENCOUNTER — Ambulatory Visit: Payer: Medicaid Other | Admitting: Physical Therapy

## 2022-01-13 ENCOUNTER — Ambulatory Visit: Payer: Medicaid Other | Admitting: Podiatry

## 2022-01-13 DIAGNOSIS — M170 Bilateral primary osteoarthritis of knee: Secondary | ICD-10-CM | POA: Diagnosis not present

## 2022-01-13 NOTE — Telephone Encounter (Signed)
Confirmation of order request received from [employer].   Talbot Grumbling, RN

## 2022-01-14 DIAGNOSIS — M170 Bilateral primary osteoarthritis of knee: Secondary | ICD-10-CM | POA: Diagnosis not present

## 2022-01-15 ENCOUNTER — Ambulatory Visit: Payer: Medicaid Other | Admitting: Physical Therapy

## 2022-01-15 DIAGNOSIS — M170 Bilateral primary osteoarthritis of knee: Secondary | ICD-10-CM | POA: Diagnosis not present

## 2022-01-16 DIAGNOSIS — M170 Bilateral primary osteoarthritis of knee: Secondary | ICD-10-CM | POA: Diagnosis not present

## 2022-01-17 DIAGNOSIS — M170 Bilateral primary osteoarthritis of knee: Secondary | ICD-10-CM | POA: Diagnosis not present

## 2022-01-17 NOTE — Progress Notes (Signed)
[name] did not show for her appointment today.  I sent my nurse a message asking her to please call her because [name] does get narcotics refilled with her office and needs to keep her follow-up appointments so that we can continue to take care of her.

## 2022-01-18 ENCOUNTER — Inpatient Hospital Stay: Payer: Medicaid Other | Admitting: Adult Health

## 2022-01-18 ENCOUNTER — Other Ambulatory Visit (HOSPITAL_COMMUNITY): Payer: Self-pay

## 2022-01-18 ENCOUNTER — Inpatient Hospital Stay: Payer: Medicaid Other

## 2022-01-18 ENCOUNTER — Telehealth: Payer: Self-pay

## 2022-01-18 ENCOUNTER — Other Ambulatory Visit: Payer: Self-pay | Admitting: Adult Health

## 2022-01-18 DIAGNOSIS — C50412 Malignant neoplasm of upper-outer quadrant of left female breast: Secondary | ICD-10-CM

## 2022-01-18 DIAGNOSIS — M170 Bilateral primary osteoarthritis of knee: Secondary | ICD-10-CM | POA: Diagnosis not present

## 2022-01-18 DIAGNOSIS — C50811 Malignant neoplasm of overlapping sites of right female breast: Secondary | ICD-10-CM

## 2022-01-18 NOTE — Telephone Encounter (Signed)
Attempt to call pt today regarding missed appointment.  No answer and no VM available.  Per Mendel Ryder we need to see this pt to continue refilling meds

## 2022-01-19 ENCOUNTER — Other Ambulatory Visit: Payer: Self-pay | Admitting: Adult Health

## 2022-01-19 ENCOUNTER — Other Ambulatory Visit (HOSPITAL_COMMUNITY): Payer: Self-pay

## 2022-01-19 ENCOUNTER — Telehealth: Payer: Self-pay | Admitting: *Deleted

## 2022-01-19 ENCOUNTER — Ambulatory Visit: Admit: 2022-01-19 | Payer: Medicaid Other | Admitting: Gynecologic Oncology

## 2022-01-19 DIAGNOSIS — M170 Bilateral primary osteoarthritis of knee: Secondary | ICD-10-CM | POA: Diagnosis not present

## 2022-01-19 DIAGNOSIS — C50412 Malignant neoplasm of upper-outer quadrant of left female breast: Secondary | ICD-10-CM

## 2022-01-19 SURGERY — BIOPSY, LYMPH NODE, SENTINEL
Anesthesia: General

## 2022-01-19 NOTE — Telephone Encounter (Signed)
Attempted to reach [name] regarding surgery scheduling. Unable to contact patient and unable to leave message.

## 2022-01-19 NOTE — Telephone Encounter (Signed)
Pt left VM requesting refill on methadone.  This RN attempted to contact pt to discuss need for visit ( missed last appt in December and then with [facility]/NP on 1/30).  Pt is not scheduled with a provider in this office presently.  Per call to pt - this RN obtained VM stating mail box was full and could not leave a message.

## 2022-01-20 ENCOUNTER — Ambulatory Visit: Payer: Medicaid Other | Admitting: Physical Therapy

## 2022-01-20 ENCOUNTER — Other Ambulatory Visit (HOSPITAL_COMMUNITY): Payer: Self-pay

## 2022-01-20 DIAGNOSIS — M170 Bilateral primary osteoarthritis of knee: Secondary | ICD-10-CM | POA: Diagnosis not present

## 2022-01-20 MED ORDER — METHADONE HCL 5 MG PO TABS
15.0000 mg | ORAL_TABLET | Freq: Three times a day (TID) | ORAL | 0 refills | Status: DC | PRN
  Filled 2022-01-20: qty 270, 30d supply, fill #0

## 2022-01-21 DIAGNOSIS — M170 Bilateral primary osteoarthritis of knee: Secondary | ICD-10-CM | POA: Diagnosis not present

## 2022-01-22 ENCOUNTER — Ambulatory Visit: Payer: Medicaid Other | Admitting: Physical Therapy

## 2022-01-22 DIAGNOSIS — M170 Bilateral primary osteoarthritis of knee: Secondary | ICD-10-CM | POA: Diagnosis not present

## 2022-01-23 DIAGNOSIS — M170 Bilateral primary osteoarthritis of knee: Secondary | ICD-10-CM | POA: Diagnosis not present

## 2022-01-24 DIAGNOSIS — M170 Bilateral primary osteoarthritis of knee: Secondary | ICD-10-CM | POA: Diagnosis not present

## 2022-01-25 DIAGNOSIS — M170 Bilateral primary osteoarthritis of knee: Secondary | ICD-10-CM | POA: Diagnosis not present

## 2022-01-26 DIAGNOSIS — M170 Bilateral primary osteoarthritis of knee: Secondary | ICD-10-CM | POA: Diagnosis not present

## 2022-01-27 ENCOUNTER — Ambulatory Visit: Payer: Medicaid Other | Admitting: Physical Therapy

## 2022-01-27 DIAGNOSIS — M170 Bilateral primary osteoarthritis of knee: Secondary | ICD-10-CM | POA: Diagnosis not present

## 2022-01-27 NOTE — Progress Notes (Signed)
Your procedure is scheduled on:         02/09/22   Report to [facility] Main  Entrance   Report to admitting at  [place] AM     Call this number if you have problems the morning of surgery [phone]    REMEMBER: NO  SOLID FOOD CANDY OR GUM AFTER MIDNIGHT. CLEAR LIQUIDS UNTIL  0430am        . NOTHING BY MOUTH EXCEPT CLEAR LIQUIDS UNTIL    . PLEASE FINISH ENSURE [id number]  PER SURGEON ORDER  WHICH NEEDS TO BE COMPLETED AT   0430am    .   Eat a light diet the day before surgeryl.  Avoid gas producing foods.    CLEAR LIQUID DIET   Foods Allowed                                                                    Coffee and tea, regular and decaf                            Fruit ices (not with fruit pulp)                                      Iced Popsicles                                    Carbonated beverages, regular and diet                                    Cranberry, grape and apple juices Sports drinks like Gatorade Lightly seasoned clear broth or consume(fat free) Sugar, honey syrup ___________________________________________________________________      BRUSH YOUR TEETH MORNING OF SURGERY AND RINSE YOUR MOUTH OUT, NO CHEWING GUM CANDY OR MINTS.     Take these medicines the morning of surgery with A SIP OF WATER:  amlodipine, allopurinol   DO NOT TAKE ANY DIABETIC MEDICATIONS DAY OF YOUR SURGERY                               You may not have any metal on your body including hair pins and              piercings  Do not wear jewelry, make-up, lotions, powders or perfumes, deodorant             Do not wear nail polish on your fingernails.  Do not shave  48 hours prior to surgery.              Men may shave face and neck.   Do not bring valuables to the hospital. [place].  Contacts, dentures or bridgework may not be worn into surgery.  Leave suitcase in the car. After surgery it may be brought  to your  room.     Patients discharged the day of surgery will not be allowed to drive home. IF YOU ARE HAVING SURGERY AND GOING HOME THE SAME DAY, YOU MUST HAVE AN ADULT TO DRIVE YOU HOME AND BE WITH YOU FOR 24 HOURS. YOU MAY GO HOME BY TAXI OR UBER OR ORTHERWISE, BUT AN ADULT MUST ACCOMPANY YOU HOME AND STAY WITH YOU FOR 24 HOURS.  Name and phone number of your driver:  Special Instructions: N/A              Please read over the following fact sheets you were given: _____________________________________________________________________  [facility] - Preparing for Surgery Before surgery, you can play an important role.  Because skin is not sterile, your skin needs to be as free of germs as possible.  You can reduce the number of germs on your skin by washing with CHG (chlorahexidine gluconate) soap before surgery.  CHG is an antiseptic cleaner which kills germs and bonds with the skin to continue killing germs even after washing. Please DO NOT use if you have an allergy to CHG or antibacterial soaps.  If your skin becomes reddened/irritated stop using the CHG and inform your nurse when you arrive at Short Stay. Do not shave (including legs and underarms) for at least 48 hours prior to the first CHG shower.  You may shave your face/neck. Please follow these instructions carefully:  1.  Shower with CHG Soap the night before surgery and the  morning of Surgery.  2.  If you choose to wash your hair, wash your hair first as usual with your  normal  shampoo.  3.  After you shampoo, rinse your hair and body thoroughly to remove the  shampoo.                           4.  Use CHG as you would any other liquid soap.  You can apply chg directly  to the skin and wash                       Gently with a scrungie or clean washcloth.  5.  Apply the CHG Soap to your body ONLY FROM THE NECK DOWN.   Do not use on face/ open                           Wound or open sores. Avoid contact with eyes, ears mouth and genitals  (private parts).                       Wash face,  Genitals (private parts) with your normal soap.             6.  Wash thoroughly, paying special attention to the area where your surgery  will be performed.  7.  Thoroughly rinse your body with warm water from the neck down.  8.  DO NOT shower/wash with your normal soap after using and rinsing off  the CHG Soap.                9.  Pat yourself dry with a clean towel.            10.  Wear clean pajamas.            11.  Place clean sheets on your bed the night of your first  shower and do not  sleep with pets. Day of Surgery : Do not apply any lotions/deodorants the morning of surgery.  Please wear clean clothes to the hospital/surgery center.  FAILURE TO FOLLOW THESE INSTRUCTIONS MAY RESULT IN THE CANCELLATION OF YOUR SURGERY PATIENT SIGNATURE_________________________________  NURSE SIGNATURE__________________________________  ________________________________________________________________________

## 2022-01-27 NOTE — Progress Notes (Signed)
Anesthesia Review:  PCP: Cardiologist : Chest x-ray : 12/21/21- 2v  EKG : 2020  Echo : Stress test:12/24/2019  Cardiac Cath :  Activity level:  Sleep Study/ CPAP : Fasting Blood Sugar :      / Checks Blood Sugar -- times a day:   Blood Thinner/ Instructions /Last Dose: ASA / Instructions/ Last Dose :
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
## 2022-01-28 ENCOUNTER — Ambulatory Visit (HOSPITAL_BASED_OUTPATIENT_CLINIC_OR_DEPARTMENT_OTHER): Payer: Medicaid Other | Admitting: Physician Assistant

## 2022-01-28 ENCOUNTER — Telehealth: Payer: Self-pay | Admitting: *Deleted

## 2022-01-28 DIAGNOSIS — I10 Essential (primary) hypertension: Secondary | ICD-10-CM

## 2022-01-28 DIAGNOSIS — E785 Hyperlipidemia, unspecified: Secondary | ICD-10-CM

## 2022-01-28 DIAGNOSIS — M170 Bilateral primary osteoarthritis of knee: Secondary | ICD-10-CM | POA: Diagnosis not present

## 2022-01-28 DIAGNOSIS — I2510 Atherosclerotic heart disease of native coronary artery without angina pectoris: Secondary | ICD-10-CM

## 2022-01-28 DIAGNOSIS — Z01810 Encounter for preprocedural cardiovascular examination: Secondary | ICD-10-CM

## 2022-01-28 DIAGNOSIS — Z720 Tobacco use: Secondary | ICD-10-CM

## 2022-01-28 NOTE — Telephone Encounter (Signed)
Following up with [name] this afternoon regarding patient message about pushing out her surgery.  Per Dr. Berline Lopes cardiac clearance would need to be obtained prior to surgery being rescheduled. Since there has been a long time from diagnosis, Dr. Berline Lopes is recommending some form of treatment. If surgery is to be delayed per patient request, she recommends medical treatment with either increasing the dose of progesterone or under going D&C with IUD placement. The D&C would be a scraping/sampling from the lining of the uterus and then placing a progesterone IUD that would treat the lining of the uterus directly with less systemic side effects. The D&C would be outpatient and she would only need downtime for about 1 day. Patient verbalized understanding. She would like to proceed with oral progesterone treatment at this time. Advised Dr. Berline Lopes will be notified and someone from the office will be in touch with her.

## 2022-01-28 NOTE — Telephone Encounter (Signed)
Patient called and left a message stated "I want to push out my surgery. I'm just scared and still dealing with my family/niece. I know it is on me to get it together. But I just want to push it out a little further." Message forwarded to Dr Berline Lopes and Lenna Sciara APP

## 2022-01-29 ENCOUNTER — Other Ambulatory Visit: Payer: Medicaid Other

## 2022-01-29 ENCOUNTER — Ambulatory Visit: Payer: Medicaid Other | Admitting: Physical Therapy

## 2022-01-29 ENCOUNTER — Other Ambulatory Visit: Payer: Self-pay | Admitting: Gynecologic Oncology

## 2022-01-29 ENCOUNTER — Ambulatory Visit: Payer: Medicaid Other | Admitting: Adult Health

## 2022-01-29 DIAGNOSIS — N8502 Endometrial intraepithelial neoplasia [EIN]: Secondary | ICD-10-CM

## 2022-01-29 DIAGNOSIS — M170 Bilateral primary osteoarthritis of knee: Secondary | ICD-10-CM | POA: Diagnosis not present

## 2022-01-29 MED ORDER — MEDROXYPROGESTERONE ACETATE 10 MG PO TABS: 20.0000 mg | ORAL_TABLET | Freq: Every day | ORAL | 2 refills | Status: DC

## 2022-01-29 NOTE — Progress Notes (Signed)
See RN note. Patient has cancelled her cardiology appointment for the third time to obtain clearance. She is wanting to place surgery on hold. Dr. Berline Lopes recommended D&C with IUD (VS increasing progesterone) as medical management until the pt is ready to proceed with surgery but the patient has chosen increase provera dose. Provera 20 mg daily sent in per Dr. Berline Lopes.

## 2022-01-29 NOTE — Telephone Encounter (Signed)
Attempted to follow up with [name] this morning regarding our conversation yesterday and a prescription. Unable to contact patient. Left message requesting return call.

## 2022-01-30 DIAGNOSIS — M170 Bilateral primary osteoarthritis of knee: Secondary | ICD-10-CM | POA: Diagnosis not present

## 2022-01-31 DIAGNOSIS — M170 Bilateral primary osteoarthritis of knee: Secondary | ICD-10-CM | POA: Diagnosis not present

## 2022-02-01 ENCOUNTER — Encounter (HOSPITAL_COMMUNITY)
Admission: RE | Admit: 2022-02-01 | Discharge: 2022-02-01 | Disposition: A | Discharge status: Home / Self Care | Payer: Medicaid Other | Source: Ambulatory Visit | Attending: Family Medicine | Admitting: Family Medicine

## 2022-02-01 DIAGNOSIS — M170 Bilateral primary osteoarthritis of knee: Secondary | ICD-10-CM | POA: Diagnosis not present

## 2022-02-01 NOTE — Telephone Encounter (Signed)
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 Told her that Dr. Berline Lopes would like to do a D&C and re biopsy her since it has been 4 months to make sure that there is no cancer and place a Mirena IUD to treat the lining of the uterus. Ms Rossetti stated that she would be open to having the procedure.  She would like to have it the second week of March if possible. Told her that Zoila Shutter said that she needs to review this with Dr. Berline Lopes. Someone will call her back regarding the procedure in the next day or two. Pt verbalized understanding.

## 2022-02-01 NOTE — Telephone Encounter (Signed)
LM for Patient to call back to discuss Dr. Charisse March recommendations for treatment options.

## 2022-02-02 DIAGNOSIS — M170 Bilateral primary osteoarthritis of knee: Secondary | ICD-10-CM | POA: Diagnosis not present

## 2022-02-03 ENCOUNTER — Ambulatory Visit: Payer: Medicaid Other | Admitting: Physical Therapy

## 2022-02-03 ENCOUNTER — Telehealth: Payer: Self-pay | Admitting: *Deleted

## 2022-02-03 DIAGNOSIS — M170 Bilateral primary osteoarthritis of knee: Secondary | ICD-10-CM | POA: Diagnosis not present

## 2022-02-03 NOTE — Telephone Encounter (Signed)
Per Dr Berline Lopes scheduled the patient for a telephone visit with Dr Berline Lopes on 2/20 at 4 pm

## 2022-02-04 ENCOUNTER — Telehealth: Payer: Self-pay

## 2022-02-04 DIAGNOSIS — M170 Bilateral primary osteoarthritis of knee: Secondary | ICD-10-CM | POA: Diagnosis not present

## 2022-02-04 NOTE — Telephone Encounter (Signed)
Received call from patients nurse navigator reporting patient has not received her shower chair.   Order was placed and processed back in January on our end. However, NN reports she spoke with [employer] and they need an updated order. [employer] reports we need to state "shower chair" and not "shower stool."   Will forward to PCP to update order and will fax to [employer].   [phone] fax #

## 2022-02-05 ENCOUNTER — Ambulatory Visit: Payer: Medicaid Other | Admitting: Physical Therapy

## 2022-02-05 DIAGNOSIS — M170 Bilateral primary osteoarthritis of knee: Secondary | ICD-10-CM | POA: Diagnosis not present

## 2022-02-06 DIAGNOSIS — M170 Bilateral primary osteoarthritis of knee: Secondary | ICD-10-CM | POA: Diagnosis not present

## 2022-02-07 DIAGNOSIS — M170 Bilateral primary osteoarthritis of knee: Secondary | ICD-10-CM | POA: Diagnosis not present

## 2022-02-08 ENCOUNTER — Other Ambulatory Visit: Payer: Self-pay

## 2022-02-08 ENCOUNTER — Inpatient Hospital Stay: Payer: Medicaid Other | Attending: Oncology | Admitting: Gynecologic Oncology

## 2022-02-08 ENCOUNTER — Telehealth: Payer: Self-pay | Admitting: Gynecologic Oncology

## 2022-02-08 ENCOUNTER — Other Ambulatory Visit: Payer: Self-pay | Admitting: Family Medicine

## 2022-02-08 DIAGNOSIS — M1990 Unspecified osteoarthritis, unspecified site: Secondary | ICD-10-CM

## 2022-02-08 DIAGNOSIS — M170 Bilateral primary osteoarthritis of knee: Secondary | ICD-10-CM | POA: Diagnosis not present

## 2022-02-08 NOTE — Telephone Encounter (Signed)
I called the patient at our scheduled phone visit time of 4 PM.  There was no answer and the call went straight to voicemail.  I left a voicemail requesting a call back.  Jeral Pinch MD Gynecologic Oncology

## 2022-02-08 NOTE — Telephone Encounter (Signed)
I have placed the order for shower chair. Thanks!

## 2022-02-09 ENCOUNTER — Ambulatory Visit: Admit: 2022-02-09 | Payer: Medicaid Other | Admitting: Gynecologic Oncology

## 2022-02-09 ENCOUNTER — Telehealth: Payer: Self-pay

## 2022-02-09 DIAGNOSIS — M170 Bilateral primary osteoarthritis of knee: Secondary | ICD-10-CM | POA: Diagnosis not present

## 2022-02-09 DIAGNOSIS — N8502 Endometrial intraepithelial neoplasia [EIN]: Secondary | ICD-10-CM

## 2022-02-09 SURGERY — BIOPSY, LYMPH NODE, SENTINEL
Anesthesia: General

## 2022-02-09 NOTE — Telephone Encounter (Signed)
Returning call to [name]. Patient left voicemail but message was not clear and was broken up. Left message for patient requesting return call.

## 2022-02-10 DIAGNOSIS — M170 Bilateral primary osteoarthritis of knee: Secondary | ICD-10-CM | POA: Diagnosis not present

## 2022-02-10 NOTE — Telephone Encounter (Signed)
Community message sent to [employer] for new shower chair order.   I asked them to please let us know if this order can not be fulfilled for any reason.

## 2022-02-11 DIAGNOSIS — M1711 Unilateral primary osteoarthritis, right knee: Secondary | ICD-10-CM | POA: Diagnosis not present

## 2022-02-11 DIAGNOSIS — M170 Bilateral primary osteoarthritis of knee: Secondary | ICD-10-CM | POA: Diagnosis not present

## 2022-02-12 ENCOUNTER — Telehealth: Payer: Self-pay

## 2022-02-12 ENCOUNTER — Telehealth: Payer: Self-pay | Admitting: *Deleted

## 2022-02-12 ENCOUNTER — Inpatient Hospital Stay: Payer: Medicaid Other

## 2022-02-12 ENCOUNTER — Inpatient Hospital Stay: Payer: Medicaid Other | Admitting: Adult Health

## 2022-02-12 DIAGNOSIS — M170 Bilateral primary osteoarthritis of knee: Secondary | ICD-10-CM | POA: Diagnosis not present

## 2022-02-12 NOTE — Telephone Encounter (Signed)
Pt called to say she cannot come in today for appointments due to cough/sinus. Message to scheduler. Pt also requesting a refill of methadone.

## 2022-02-12 NOTE — Telephone Encounter (Signed)
Called and spoke with pt, per [facility] we are declining refill request for methadone as pt has called to last minute cancel on her last few appointments.  I advised pt that she must come in for her visits to continue to receive these medications.  This is the second time she has cancelled and still asked for medicines.  Pt questions why, I advised her that it is policy.  I scheduled pt for an appointment 3/2 while we were on the phone, pt verbalized understanding and thanks.

## 2022-02-12 NOTE — Telephone Encounter (Signed)
Received phone call from Ms. Swayze this morning. Patient requesting to reschedule her telephone call she missed with Dr. Berline Lopes from 02/08/22. Patient apologized for missing her call.  Telephone appointment rescheduled for  03/01/22 at 3:45 pm. Patient is in agreement of appointment date and time.  Patient inquiring if our office has received any documents from [employer] firm. Patient states she dropped off paperwork to be completed on 02/05/22. Advised that the gyn oncology office has not received any paperwork but will investigate and check with registration and HIM department to see if they have it. Our office will call and follow up with her. Patient verbalized understanding.   Patient requesting Dr. Berline Lopes refill her methadone prescription. Explained that Wilber Bihari, NP manages her prescription and patient has an appointment with her today.

## 2022-02-12 NOTE — Telephone Encounter (Signed)
I am uncomfortable prescribing these types of medications without her adhering to her follow-up schedule.  This is the third same-day cancellation or no-show she has had in a row.  I will refill methadone when she comes in for an appointment.  I reviewed these concerns with Hinda Lenis last month and only agreed to refill methadone if she agreed to keep her appointment for this month.  Wilber Bihari, NP 02/12/22 4:22 PM Medical Oncology and Hematology [facility] [place], [place] [address] Tel. [phone]    Fax. [phone]

## 2022-02-13 DIAGNOSIS — M170 Bilateral primary osteoarthritis of knee: Secondary | ICD-10-CM | POA: Diagnosis not present

## 2022-02-14 DIAGNOSIS — M170 Bilateral primary osteoarthritis of knee: Secondary | ICD-10-CM | POA: Diagnosis not present

## 2022-02-15 ENCOUNTER — Ambulatory Visit: Payer: Medicaid Other | Admitting: Podiatry

## 2022-02-15 DIAGNOSIS — M170 Bilateral primary osteoarthritis of knee: Secondary | ICD-10-CM | POA: Diagnosis not present

## 2022-02-16 ENCOUNTER — Telehealth: Payer: Self-pay | Admitting: Adult Health

## 2022-02-16 DIAGNOSIS — M170 Bilateral primary osteoarthritis of knee: Secondary | ICD-10-CM | POA: Diagnosis not present

## 2022-02-16 NOTE — Telephone Encounter (Signed)
.  Called patient to schedule appointment per 2/28 inbasket, patient is aware of date and time.

## 2022-02-17 DIAGNOSIS — M170 Bilateral primary osteoarthritis of knee: Secondary | ICD-10-CM | POA: Diagnosis not present

## 2022-02-18 ENCOUNTER — Ambulatory Visit: Payer: Medicaid Other | Admitting: Adult Health

## 2022-02-18 ENCOUNTER — Other Ambulatory Visit: Payer: Self-pay | Admitting: Adult Health

## 2022-02-18 ENCOUNTER — Other Ambulatory Visit (HOSPITAL_COMMUNITY): Payer: Self-pay

## 2022-02-18 DIAGNOSIS — M170 Bilateral primary osteoarthritis of knee: Secondary | ICD-10-CM | POA: Diagnosis not present

## 2022-02-18 DIAGNOSIS — C50412 Malignant neoplasm of upper-outer quadrant of left female breast: Secondary | ICD-10-CM

## 2022-02-19 ENCOUNTER — Inpatient Hospital Stay: Payer: Medicaid Other

## 2022-02-19 ENCOUNTER — Other Ambulatory Visit: Payer: Self-pay

## 2022-02-19 ENCOUNTER — Inpatient Hospital Stay: Payer: Medicaid Other | Attending: Oncology | Admitting: Adult Health

## 2022-02-19 ENCOUNTER — Other Ambulatory Visit (HOSPITAL_COMMUNITY): Payer: Self-pay

## 2022-02-19 VITALS — BP 121/69 | HR 72 | Temp 97.5°F | Resp 16 | Ht 65.0 in | Wt 239.1 lb

## 2022-02-19 DIAGNOSIS — F17210 Nicotine dependence, cigarettes, uncomplicated: Secondary | ICD-10-CM | POA: Diagnosis not present

## 2022-02-19 DIAGNOSIS — M170 Bilateral primary osteoarthritis of knee: Secondary | ICD-10-CM | POA: Diagnosis not present

## 2022-02-19 DIAGNOSIS — C50811 Malignant neoplasm of overlapping sites of right female breast: Secondary | ICD-10-CM | POA: Diagnosis not present

## 2022-02-19 DIAGNOSIS — C50412 Malignant neoplasm of upper-outer quadrant of left female breast: Secondary | ICD-10-CM | POA: Diagnosis not present

## 2022-02-19 DIAGNOSIS — Z853 Personal history of malignant neoplasm of breast: Secondary | ICD-10-CM | POA: Insufficient documentation

## 2022-02-19 DIAGNOSIS — Z79899 Other long term (current) drug therapy: Secondary | ICD-10-CM | POA: Insufficient documentation

## 2022-02-19 MED ORDER — METHADONE HCL 5 MG PO TABS
15.0000 mg | ORAL_TABLET | Freq: Three times a day (TID) | ORAL | 0 refills | Status: DC | PRN
  Filled 2022-02-19: qty 270, 30d supply, fill #0
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
## 2022-02-20 ENCOUNTER — Encounter: Payer: Self-pay | Admitting: Adult Health

## 2022-02-20 DIAGNOSIS — M170 Bilateral primary osteoarthritis of knee: Secondary | ICD-10-CM | POA: Diagnosis not present

## 2022-02-20 NOTE — Assessment & Plan Note (Signed)
[name] is a 61 year old woman who has a history of stage IIIa triple positive breast cancer status post bilateral mastectomies, adjuvant chemotherapy, maintenance trastuzumab, and is currently on treatment with tamoxifen which began in August 2023. ? ?1.  Stage IIIa triple positive breast cancer: She has no clinical sign of breast cancer recurrence today.  I did recommend that she go ahead and discontinue tamoxifen considering her endometrial hyperplasia.  She does not want to do this. ? ?2.  Endometrial hyperplasia questionable invasive cancer: I recommended that she keep her follow-up appointment with Dr. Berline Lopes to discuss hysterectomy, and that she go ahead and proceed with this promptly. ? ?3.  Chronic pain: I refilled her methadone.  I reviewed with her that we do need to see her in the clinic for follow-up especially if we are prescribing her this medication.  She verbalized understanding. ? ?4.  Health maintenance: We discussed healthy diet and exercise and she tells me she is planning on working on this. ? ?Johari will return in 3 months for labs, follow-up.  She told me today that she does not want to see a new oncologist and that she would prefer to follow-up with me for the time being. ?

## 2022-02-21 DIAGNOSIS — M170 Bilateral primary osteoarthritis of knee: Secondary | ICD-10-CM | POA: Diagnosis not present

## 2022-02-22 DIAGNOSIS — M170 Bilateral primary osteoarthritis of knee: Secondary | ICD-10-CM | POA: Diagnosis not present

## 2022-02-23 DIAGNOSIS — M170 Bilateral primary osteoarthritis of knee: Secondary | ICD-10-CM | POA: Diagnosis not present

## 2022-02-24 DIAGNOSIS — M170 Bilateral primary osteoarthritis of knee: Secondary | ICD-10-CM | POA: Diagnosis not present

## 2022-02-25 DIAGNOSIS — M170 Bilateral primary osteoarthritis of knee: Secondary | ICD-10-CM | POA: Diagnosis not present

## 2022-02-26 DIAGNOSIS — M170 Bilateral primary osteoarthritis of knee: Secondary | ICD-10-CM | POA: Diagnosis not present

## 2022-02-27 DIAGNOSIS — M170 Bilateral primary osteoarthritis of knee: Secondary | ICD-10-CM | POA: Diagnosis not present

## 2022-02-28 DIAGNOSIS — M170 Bilateral primary osteoarthritis of knee: Secondary | ICD-10-CM | POA: Diagnosis not present

## 2022-03-01 ENCOUNTER — Inpatient Hospital Stay (HOSPITAL_BASED_OUTPATIENT_CLINIC_OR_DEPARTMENT_OTHER): Payer: Medicaid Other | Admitting: Gynecologic Oncology

## 2022-03-01 ENCOUNTER — Ambulatory Visit: Payer: Medicaid Other | Admitting: Podiatry

## 2022-03-01 ENCOUNTER — Encounter: Payer: Self-pay | Admitting: Gynecologic Oncology

## 2022-03-01 DIAGNOSIS — Z6841 Body Mass Index (BMI) 40.0 and over, adult: Secondary | ICD-10-CM

## 2022-03-01 DIAGNOSIS — N8502 Endometrial intraepithelial neoplasia [EIN]: Secondary | ICD-10-CM | POA: Diagnosis not present

## 2022-03-01 DIAGNOSIS — M170 Bilateral primary osteoarthritis of knee: Secondary | ICD-10-CM | POA: Diagnosis not present

## 2022-03-01 MED ORDER — MEDROXYPROGESTERONE ACETATE 10 MG PO TABS: 20.0000 mg | ORAL_TABLET | Freq: Every day | ORAL | 2 refills | Status: DC
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
## 2022-03-02 ENCOUNTER — Telehealth: Payer: Self-pay | Admitting: *Deleted

## 2022-03-02 DIAGNOSIS — M170 Bilateral primary osteoarthritis of knee: Secondary | ICD-10-CM | POA: Diagnosis not present

## 2022-03-02 NOTE — Telephone Encounter (Signed)
Per Dr Berline Lopes patient is scheduled for cardio clearance on 3/31 at 11:20 am. Patient aware  ?

## 2022-03-03 DIAGNOSIS — M170 Bilateral primary osteoarthritis of knee: Secondary | ICD-10-CM | POA: Diagnosis not present

## 2022-03-04 DIAGNOSIS — M170 Bilateral primary osteoarthritis of knee: Secondary | ICD-10-CM | POA: Diagnosis not present

## 2022-03-05 DIAGNOSIS — M170 Bilateral primary osteoarthritis of knee: Secondary | ICD-10-CM | POA: Diagnosis not present

## 2022-03-06 DIAGNOSIS — M170 Bilateral primary osteoarthritis of knee: Secondary | ICD-10-CM | POA: Diagnosis not present

## 2022-03-07 DIAGNOSIS — M170 Bilateral primary osteoarthritis of knee: Secondary | ICD-10-CM | POA: Diagnosis not present

## 2022-03-08 DIAGNOSIS — M170 Bilateral primary osteoarthritis of knee: Secondary | ICD-10-CM | POA: Diagnosis not present

## 2022-03-09 DIAGNOSIS — M170 Bilateral primary osteoarthritis of knee: Secondary | ICD-10-CM | POA: Diagnosis not present

## 2022-03-10 DIAGNOSIS — M170 Bilateral primary osteoarthritis of knee: Secondary | ICD-10-CM | POA: Diagnosis not present

## 2022-03-11 DIAGNOSIS — M170 Bilateral primary osteoarthritis of knee: Secondary | ICD-10-CM | POA: Diagnosis not present

## 2022-03-12 DIAGNOSIS — M170 Bilateral primary osteoarthritis of knee: Secondary | ICD-10-CM | POA: Diagnosis not present

## 2022-03-13 DIAGNOSIS — M170 Bilateral primary osteoarthritis of knee: Secondary | ICD-10-CM | POA: Diagnosis not present

## 2022-03-14 DIAGNOSIS — M170 Bilateral primary osteoarthritis of knee: Secondary | ICD-10-CM | POA: Diagnosis not present

## 2022-03-15 ENCOUNTER — Ambulatory Visit: Payer: Medicaid Other | Admitting: Podiatry

## 2022-03-15 DIAGNOSIS — M170 Bilateral primary osteoarthritis of knee: Secondary | ICD-10-CM | POA: Diagnosis not present

## 2022-03-16 DIAGNOSIS — M170 Bilateral primary osteoarthritis of knee: Secondary | ICD-10-CM | POA: Diagnosis not present

## 2022-03-17 ENCOUNTER — Other Ambulatory Visit: Payer: Self-pay | Admitting: Adult Health

## 2022-03-17 ENCOUNTER — Other Ambulatory Visit (HOSPITAL_COMMUNITY): Payer: Self-pay

## 2022-03-17 DIAGNOSIS — M170 Bilateral primary osteoarthritis of knee: Secondary | ICD-10-CM | POA: Diagnosis not present

## 2022-03-17 DIAGNOSIS — C50412 Malignant neoplasm of upper-outer quadrant of left female breast: Secondary | ICD-10-CM

## 2022-03-17 NOTE — Telephone Encounter (Signed)
[name], you last saw [name] the beginning of March with f/u in 3 months.  Can you please refill her Methadone, thanks! ?

## 2022-03-18 DIAGNOSIS — M170 Bilateral primary osteoarthritis of knee: Secondary | ICD-10-CM | POA: Diagnosis not present

## 2022-03-19 ENCOUNTER — Ambulatory Visit: Payer: Medicaid Other | Admitting: Physician Assistant

## 2022-03-19 ENCOUNTER — Other Ambulatory Visit: Payer: Self-pay | Admitting: Hematology and Oncology

## 2022-03-19 ENCOUNTER — Other Ambulatory Visit: Payer: Self-pay | Admitting: Adult Health

## 2022-03-19 ENCOUNTER — Other Ambulatory Visit (HOSPITAL_COMMUNITY): Payer: Self-pay

## 2022-03-19 DIAGNOSIS — C50412 Malignant neoplasm of upper-outer quadrant of left female breast: Secondary | ICD-10-CM

## 2022-03-19 DIAGNOSIS — M170 Bilateral primary osteoarthritis of knee: Secondary | ICD-10-CM | POA: Diagnosis not present

## 2022-03-20 ENCOUNTER — Other Ambulatory Visit (HOSPITAL_COMMUNITY): Payer: Self-pay

## 2022-03-20 ENCOUNTER — Other Ambulatory Visit: Payer: Self-pay | Admitting: Hematology and Oncology

## 2022-03-20 DIAGNOSIS — C50412 Malignant neoplasm of upper-outer quadrant of left female breast: Secondary | ICD-10-CM

## 2022-03-20 DIAGNOSIS — M170 Bilateral primary osteoarthritis of knee: Secondary | ICD-10-CM | POA: Diagnosis not present

## 2022-03-21 DIAGNOSIS — M170 Bilateral primary osteoarthritis of knee: Secondary | ICD-10-CM | POA: Diagnosis not present
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
## 2022-03-22 ENCOUNTER — Other Ambulatory Visit: Payer: Self-pay | Admitting: Adult Health

## 2022-03-22 ENCOUNTER — Other Ambulatory Visit (HOSPITAL_COMMUNITY): Payer: Self-pay

## 2022-03-22 ENCOUNTER — Telehealth: Payer: Self-pay | Admitting: Hematology and Oncology

## 2022-03-22 DIAGNOSIS — C50412 Malignant neoplasm of upper-outer quadrant of left female breast: Secondary | ICD-10-CM

## 2022-03-22 DIAGNOSIS — M170 Bilateral primary osteoarthritis of knee: Secondary | ICD-10-CM | POA: Diagnosis not present

## 2022-03-22 MED ORDER — METHADONE HCL 5 MG PO TABS
15.0000 mg | ORAL_TABLET | Freq: Three times a day (TID) | ORAL | 0 refills | Status: DC | PRN
  Filled 2022-03-22: qty 270, 30d supply, fill #0

## 2022-03-22 NOTE — Progress Notes (Signed)
Refilling Methadone.  PMP aware reviewed, no red flags. ? ?Wilber Bihari, NP 03/22/22 9:20 AM ?Medical Oncology and Hematology ?[place] ?[address] ?[place], [place] [address] ?Tel. [phone]    Fax. [phone] ? ?

## 2022-03-22 NOTE — Telephone Encounter (Signed)
.  Called pt per 4/3 inbasket , Patient was unavailable, a message with appt time and date was left with number on file.   ?

## 2022-03-23 ENCOUNTER — Telehealth: Payer: Self-pay | Admitting: Hematology and Oncology

## 2022-03-23 DIAGNOSIS — M170 Bilateral primary osteoarthritis of knee: Secondary | ICD-10-CM | POA: Diagnosis not present

## 2022-03-23 NOTE — Telephone Encounter (Signed)
Called to scheduled appointment per [facility]  staff message. Patient indicated that she wanted to hold on, on scheduling until she see's dr.tucker on 04/06. Please advise that I did not schedule appointments per patients request.  ?

## 2022-03-24 ENCOUNTER — Ambulatory Visit: Payer: Medicaid Other | Admitting: General Practice

## 2022-03-24 ENCOUNTER — Telehealth: Payer: Self-pay | Admitting: *Deleted

## 2022-03-24 DIAGNOSIS — M170 Bilateral primary osteoarthritis of knee: Secondary | ICD-10-CM | POA: Diagnosis not present

## 2022-03-24 NOTE — Telephone Encounter (Signed)
Spoke with pt this afternoon. Pt stated that she rescheduled her cardiologist appointment to 04/01/22.  Informed pt that we will cancel her appointment with Dr.Tucker tomorrow and pt is to call us and schedule a new appointment once she has seen the cardiologist. Pt verbalized understanding.  ?

## 2022-03-25 ENCOUNTER — Ambulatory Visit: Payer: Medicaid Other | Admitting: Gynecologic Oncology

## 2022-03-25 DIAGNOSIS — M170 Bilateral primary osteoarthritis of knee: Secondary | ICD-10-CM | POA: Diagnosis not present

## 2022-03-26 DIAGNOSIS — M170 Bilateral primary osteoarthritis of knee: Secondary | ICD-10-CM | POA: Diagnosis not present

## 2022-03-27 DIAGNOSIS — M170 Bilateral primary osteoarthritis of knee: Secondary | ICD-10-CM | POA: Diagnosis not present

## 2022-03-28 DIAGNOSIS — M170 Bilateral primary osteoarthritis of knee: Secondary | ICD-10-CM | POA: Diagnosis not present
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
## 2022-03-29 ENCOUNTER — Other Ambulatory Visit: Payer: Self-pay

## 2022-03-29 ENCOUNTER — Encounter (HOSPITAL_COMMUNITY): Payer: Self-pay

## 2022-03-29 ENCOUNTER — Observation Stay (HOSPITAL_COMMUNITY)
Admission: EM | Admit: 2022-03-29 | Discharge: 2022-03-31 | Disposition: A | Discharge status: Home / Self Care | Payer: Medicaid Other | Attending: Emergency Medicine | Admitting: Emergency Medicine

## 2022-03-29 ENCOUNTER — Emergency Department (HOSPITAL_COMMUNITY): Payer: Medicaid Other

## 2022-03-29 DIAGNOSIS — M170 Bilateral primary osteoarthritis of knee: Secondary | ICD-10-CM | POA: Diagnosis not present

## 2022-03-29 DIAGNOSIS — F1410 Cocaine abuse, uncomplicated: Secondary | ICD-10-CM

## 2022-03-29 DIAGNOSIS — Z9013 Acquired absence of bilateral breasts and nipples: Secondary | ICD-10-CM | POA: Diagnosis not present

## 2022-03-29 DIAGNOSIS — F1120 Opioid dependence, uncomplicated: Secondary | ICD-10-CM

## 2022-03-29 DIAGNOSIS — Z96651 Presence of right artificial knee joint: Secondary | ICD-10-CM | POA: Diagnosis not present

## 2022-03-29 DIAGNOSIS — E785 Hyperlipidemia, unspecified: Secondary | ICD-10-CM | POA: Insufficient documentation

## 2022-03-29 DIAGNOSIS — N938 Other specified abnormal uterine and vaginal bleeding: Secondary | ICD-10-CM | POA: Diagnosis not present

## 2022-03-29 DIAGNOSIS — F17210 Nicotine dependence, cigarettes, uncomplicated: Secondary | ICD-10-CM | POA: Diagnosis not present

## 2022-03-29 DIAGNOSIS — R079 Chest pain, unspecified: Secondary | ICD-10-CM | POA: Diagnosis not present

## 2022-03-29 DIAGNOSIS — W19XXXA Unspecified fall, initial encounter: Secondary | ICD-10-CM

## 2022-03-29 DIAGNOSIS — I2510 Atherosclerotic heart disease of native coronary artery without angina pectoris: Secondary | ICD-10-CM | POA: Diagnosis not present

## 2022-03-29 DIAGNOSIS — Z853 Personal history of malignant neoplasm of breast: Secondary | ICD-10-CM | POA: Insufficient documentation

## 2022-03-29 DIAGNOSIS — Z79899 Other long term (current) drug therapy: Secondary | ICD-10-CM | POA: Insufficient documentation

## 2022-03-29 DIAGNOSIS — Z20822 Contact with and (suspected) exposure to covid-19: Secondary | ICD-10-CM | POA: Insufficient documentation

## 2022-03-29 DIAGNOSIS — R531 Weakness: Secondary | ICD-10-CM

## 2022-03-29 DIAGNOSIS — Z7982 Long term (current) use of aspirin: Secondary | ICD-10-CM | POA: Diagnosis not present

## 2022-03-29 DIAGNOSIS — I10 Essential (primary) hypertension: Secondary | ICD-10-CM | POA: Diagnosis not present

## 2022-03-29 DIAGNOSIS — E876 Hypokalemia: Secondary | ICD-10-CM | POA: Diagnosis not present

## 2022-03-29 DIAGNOSIS — K5900 Constipation, unspecified: Secondary | ICD-10-CM | POA: Diagnosis not present

## 2022-03-29 DIAGNOSIS — R0789 Other chest pain: Secondary | ICD-10-CM | POA: Diagnosis not present

## 2022-03-29 DIAGNOSIS — Z6841 Body Mass Index (BMI) 40.0 and over, adult: Secondary | ICD-10-CM

## 2022-03-29 DIAGNOSIS — G44209 Tension-type headache, unspecified, not intractable: Secondary | ICD-10-CM | POA: Insufficient documentation

## 2022-03-29 LAB — CBC
HCT: 50 % — ABNORMAL HIGH (ref 36.0–46.0)
Hemoglobin: 16.5 g/dL — ABNORMAL HIGH (ref 12.0–15.0)
MCH: 28.4 pg (ref 26.0–34.0)
MCHC: 33 g/dL (ref 30.0–36.0)
MCV: 86.1 fL (ref 80.0–100.0)
Platelets: 477 10*3/uL — ABNORMAL HIGH (ref 150–400)
RBC: 5.81 MIL/uL — ABNORMAL HIGH (ref 3.87–5.11)
RDW: 16.3 % — ABNORMAL HIGH (ref 11.5–15.5)
WBC: 9.8 10*3/uL (ref 4.0–10.5)
nRBC: 0 % (ref 0.0–0.2)

## 2022-03-29 LAB — BASIC METABOLIC PANEL
Anion gap: 10 (ref 5–15)
BUN: 5 mg/dL — ABNORMAL LOW (ref 6–20)
CO2: 28 mmol/L (ref 22–32)
Calcium: 9 mg/dL (ref 8.9–10.3)
Chloride: 103 mmol/L (ref 98–111)
Creatinine, Ser: 0.79 mg/dL (ref 0.44–1.00)
GFR, Estimated: >60 mL/min (ref >60)
Glucose, Bld: 102 mg/dL — ABNORMAL HIGH (ref 70–99)
Potassium: 2.5 mmol/L — CL (ref 3.5–5.1)
Sodium: 141 mmol/L (ref 135–145)

## 2022-03-29 LAB — TROPONIN I (HIGH SENSITIVITY)
Troponin I (High Sensitivity): 10 ng/L (ref <18)
Troponin I (High Sensitivity): 10 ng/L (ref <18)

## 2022-03-29 LAB — MAGNESIUM: Magnesium: 1.6 mg/dL — ABNORMAL LOW (ref 1.7–2.4)

## 2022-03-29 LAB — D-DIMER, QUANTITATIVE: D-Dimer, Quant: 0.45 ug/mL-FEU (ref 0.00–0.50)

## 2022-03-29 MED ORDER — POTASSIUM CHLORIDE CRYS ER 20 MEQ PO TBCR
40.0000 meq | EXTENDED_RELEASE_TABLET | Freq: Once | ORAL | Status: AC
Start: 2022-03-29 — End: 2022-03-29
  Administered 2022-03-29: 40 meq via ORAL
  Filled 2022-03-29: qty 2

## 2022-03-29 MED ORDER — POTASSIUM CHLORIDE 10 MEQ/100ML IV SOLN
10.0000 meq | INTRAVENOUS | Status: AC
  Administered 2022-03-29 (×2): 10 meq via INTRAVENOUS
  Filled 2022-03-29: qty 100

## 2022-03-29 MED ORDER — NITROGLYCERIN 0.4 MG SL SUBL
0.4000 mg | SUBLINGUAL_TABLET | SUBLINGUAL | Status: DC | PRN
  Administered 2022-03-29: 0.4 mg via SUBLINGUAL
  Filled 2022-03-29: qty 1

## 2022-03-29 NOTE — ED Triage Notes (Signed)
Pt reports with sharp chest pain since this morning. Pt states that she has been experiencing weakness and falling x 2 today. Pt also reports being constipated x 2 days.  ?

## 2022-03-29 NOTE — ED Provider Notes (Signed)
?[facility] DEPT ?Provider Note ? ? ?CSN: [id number] ?Arrival date & time: 03/29/22  2054 ? ?  ? ?History ? ?Chief Complaint  ?Patient presents with  ? Chest Pain  ? Abdominal Pain  ? Weakness  ? ? ?[name] is a 61 y.o. female. ? ? ?Chest Pain ?Associated symptoms: abdominal pain and weakness   ?Abdominal Pain ?Associated symptoms: chest pain   ?Weakness ?Associated symptoms: abdominal pain and chest pain   ? ?Patient is a 61 year old female with a past medical history significant for CAD with stent, HLD, HTN, prediabetes, obesity, history of STEMI, hypokalemia, tobacco use.  Aortic atherosclerosis.  Also history of breast cancer status post bilateral mastectomy, told that she has some precancerous lesions on her uterus and is planning to go ahead with a hysterectomy. ? ?Patient presents emergency room today with complaints of sharp chest pain she states it has been constant since this morning she states is been ongoing for greater than 6 hours.  She states it has been constant.  She states that it radiates to her left shoulder she states it is somewhat worse with breathing and also with moving around.  She states that she has felt profoundly weak all day she states that twice she sank to the floor because of how weak she fell.  She states that her legs just gave out beneath her.  She did not strike her head or lose consciousness.  She also states that over the past 2 days she has had harder bowel movements has been generally more constipated has had some generalized mild abdominal pain as well.  Denies any vomiting but does endorse nausea. ? ?No cough or hemoptysis. ? ?  ? ?Home Medications ?Prior to Admission medications   ?Medication Sig Start Date End Date Taking? Authorizing Provider  ?acetaminophen (TYLENOL) 500 MG tablet Take 2 tablets (1,000 mg total) by mouth every 8 (eight) hours as needed. 03/14/18   Carlyle Dolly, MD  ?allopurinol (ZYLOPRIM) 300 MG tablet TAKE 1  TABLET BY MOUTH EVERY DAY 10/19/21   Gladys Damme, MD  ?amLODipine (NORVASC) 5 MG tablet Take 1 tablet (5 mg total) by mouth daily. 04/23/21   [name], MD  ?ascorbic acid (VITAMIN C) 500 MG tablet Take 500 mg by mouth daily.    [provider]  ?aspirin EC 81 MG tablet Take 81 mg by mouth daily.    [provider]  ?atorvastatin (LIPITOR) 40 MG tablet Take 1 tablet (40 mg total) by mouth daily at 6 PM. 04/21/21   [name], MD  ?camphor-menthol Timoteo Ace) lotion Apply topically as needed for itching. 07/17/19   Eugenie Filler, MD  ?cholecalciferol (VITAMIN D3) 25 MCG (1000 UNIT) tablet Take 1,000 Units by mouth daily.    [provider]  ?ELDERBERRY PO Take 1,000 mg by mouth daily.    [provider]  ?Ginkgo Biloba 120 MG TABS Take 120 mg by mouth daily.    [provider]  ?ibuprofen (ADVIL) 800 MG tablet Take 1 tablet (800 mg total) by mouth every 8 (eight) hours as needed for moderate pain. For AFTER surgery only 10/26/21   Joylene John D, NP  ?isosorbide mononitrate (IMDUR) 60 MG 24 hr tablet Take 1 tablet (60 mg total) by mouth at bedtime. 04/21/21   [name], MD  ?KLOR-CON M20 20 MEQ tablet TAKE 1 TABLET BY MOUTH THREE TIMES A DAY 01/12/22   [name], MD  ?medroxyPROGESTERone (PROVERA) 10 MG tablet Take  2 tablets (20 mg total) by mouth daily. 03/01/22   Lafonda Mosses, MD  ?meloxicam (MOBIC) 15 MG tablet Take 1 tablet (15 mg total) by mouth daily. 10/20/21   Gladys Damme, MD  ?methadone (DOLOPHINE) 5 MG tablet Take 3 tablets by mouth every 8  hours as needed. 03/22/22 09/18/22  Gardenia Phlegm, NP  ?metoprolol succinate (TOPROL-XL) 50 MG 24 hr tablet TAKE 2 TABLETS (100 MG TOTAL) BY MOUTH DAILY. TAKE WITH OR IMMEDIATELY FOLLOWING A MEAL. 10/19/21   Gladys Damme, MD  ?nitroGLYCERIN (NITROSTAT) 0.4 MG SL tablet Place 1 tablet (0.4 mg total) under the tongue every 5 (five) minutes as needed for chest pain. 02/08/18   Richardson Dopp T,  PA-C  ?OVER THE COUNTER MEDICATION Take 30 mg by mouth once a week. Zinc    [provider]  ?senna-docusate (SENOKOT-S) 8.6-50 MG tablet Take 2 tablets by mouth at bedtime. For AFTER surgery, do not take if having diarrhea 10/26/21   Joylene John D, NP  ?tamoxifen (NOLVADEX) 20 MG tablet Take 1 tablet (20 mg total) by mouth daily. 02/09/21   [place], Virgie Dad, MD  ?famotidine (PEPCID) 20 MG tablet Take 20 mg by mouth 2 (two) times daily.    03/10/12  [provider]  ?   ? ?Allergies    ?Patient has no known allergies.   ? ?Review of Systems   ?Review of Systems  ?Cardiovascular:  Positive for chest pain.  ?Gastrointestinal:  Positive for abdominal pain.  ?Neurological:  Positive for weakness.  ? ?Physical Exam ?Updated Vital Signs ?BP (!) 194/130   Pulse (!) 106   Temp 98.3 ?F (36.8 ?C) (Oral)   Resp (!) 29   Ht '5\' 5"'$  (1.651 m)   Wt 100.2 kg   LMP 08/07/2011   SpO2 96%   BMI 36.78 kg/m?  ?Physical Exam ?Vitals and nursing note reviewed.  ?Constitutional:   ?   General: She is in acute distress.  ?   Comments: Uncomfortable 61 year old female  ?HENT:  ?   Head: Normocephalic and atraumatic.  ?   Nose: Nose normal.  ?   Mouth/Throat:  ?   Mouth: Mucous membranes are moist.  ?Eyes:  ?   General: No scleral icterus. ?Cardiovascular:  ?   Rate and Rhythm: Regular rhythm. Tachycardia present.  ?   Pulses: Normal pulses.  ?   Heart sounds: Normal heart sounds.  ?   Comments: Irregular heartbeat ?Pulmonary:  ?   Effort: Pulmonary effort is normal. No respiratory distress.  ?   Breath sounds: Normal breath sounds. No wheezing.  ?Abdominal:  ?   Palpations: Abdomen is soft.  ?   Tenderness: There is abdominal tenderness. There is no guarding or rebound.  ?   Comments: Mild generalized abdominal tenderness.  No guarding or rebound.  ?Musculoskeletal:  ?   Cervical back: Normal range of motion.  ?   Right lower leg: No edema.  ?   Left lower leg: No edema.  ?Skin: ?   General: Skin is warm and dry.   ?   Capillary Refill: Capillary refill takes less than 2 seconds.  ?Neurological:  ?   Mental Status: She is alert. Mental status is at baseline.  ?Psychiatric:     ?   Mood and Affect: Mood normal.     ?   Behavior: Behavior normal.  ? ? ?ED Results / Procedures / Treatments   ?Labs ?(all labs ordered are listed, but only abnormal results  are displayed) ?Labs Reviewed  ?BASIC METABOLIC PANEL - Abnormal; Notable for the following components:  ?    Result Value  ? Potassium 2.5 (*)   ? Glucose, Bld 102 (*)   ? BUN 5 (*)   ? All other components within normal limits  ?CBC - Abnormal; Notable for the following components:  ? RBC 5.81 (*)   ? Hemoglobin 16.5 (*)   ? HCT 50.0 (*)   ? RDW 16.3 (*)   ? Platelets 477 (*)   ? All other components within normal limits  ?MAGNESIUM - Abnormal; Notable for the following components:  ? Magnesium 1.6 (*)   ? All other components within normal limits  ?RESP PANEL BY RT-PCR (FLU A&B, COVID) ARPGX2  ?D-DIMER, QUANTITATIVE  ?HEPATIC FUNCTION PANEL  ?LIPASE, BLOOD  ?MAGNESIUM  ?COMPREHENSIVE METABOLIC PANEL  ?CBC WITH DIFFERENTIAL/PLATELET  ?I-STAT BETA HCG BLOOD, ED ([facility], [facility], [facility] ONLY)  ?TROPONIN I (HIGH SENSITIVITY)  ?TROPONIN I (HIGH SENSITIVITY)  ?TROPONIN I (HIGH SENSITIVITY)  ? ? ?EKG ?EKG Interpretation ? ?Date/Time:  Monday March 29 2022 21:06:01 EDT ?Ventricular Rate:  105 ?PR Interval:  152 ?QRS Duration: 102 ?QT Interval:  423 ?QTC Calculation: 560 ?R Axis:   63 ?Text Interpretation: Sinus tachycardia with irregular rate Probable left atrial enlargement Left ventricular hypertrophy Inferior infarct, old Prolonged QT interval Confirmed by Regan Lemming ([id number]) on 03/29/2022 10:03:31 PM ? ?Radiology ?DG Chest 2 View ? ?Result Date: 03/29/2022 ?CLINICAL DATA:  Chest pain. EXAM: CHEST - 2 VIEW COMPARISON:  December 21, 2021 FINDINGS: There is no evidence of acute infiltrate, pleural effusion or pneumothorax. The heart size and mediastinal contours are within normal limits. Radiopaque  surgical clips are seen along the left axilla. Multilevel degenerative changes are noted throughout the thoracic spine. The visualized skeletal structures are otherwise unremarkable. IMPRESSION: No active car

## 2022-03-30 ENCOUNTER — Observation Stay (HOSPITAL_BASED_OUTPATIENT_CLINIC_OR_DEPARTMENT_OTHER): Payer: Medicaid Other

## 2022-03-30 ENCOUNTER — Other Ambulatory Visit (HOSPITAL_COMMUNITY): Payer: Self-pay

## 2022-03-30 DIAGNOSIS — I10 Essential (primary) hypertension: Secondary | ICD-10-CM

## 2022-03-30 DIAGNOSIS — R079 Chest pain, unspecified: Secondary | ICD-10-CM | POA: Diagnosis present

## 2022-03-30 DIAGNOSIS — I2510 Atherosclerotic heart disease of native coronary artery without angina pectoris: Secondary | ICD-10-CM | POA: Diagnosis not present

## 2022-03-30 DIAGNOSIS — M170 Bilateral primary osteoarthritis of knee: Secondary | ICD-10-CM | POA: Diagnosis not present

## 2022-03-30 DIAGNOSIS — R0789 Other chest pain: Secondary | ICD-10-CM

## 2022-03-30 DIAGNOSIS — E876 Hypokalemia: Secondary | ICD-10-CM

## 2022-03-30 DIAGNOSIS — K5904 Chronic idiopathic constipation: Secondary | ICD-10-CM

## 2022-03-30 DIAGNOSIS — R072 Precordial pain: Secondary | ICD-10-CM | POA: Diagnosis not present

## 2022-03-30 DIAGNOSIS — E785 Hyperlipidemia, unspecified: Secondary | ICD-10-CM | POA: Diagnosis not present

## 2022-03-30 DIAGNOSIS — K5900 Constipation, unspecified: Secondary | ICD-10-CM | POA: Diagnosis present

## 2022-03-30 LAB — ECHOCARDIOGRAM COMPLETE
Area-P 1/2: 1.86 cm2
Height: 65 in
S' Lateral: 4 cm
Weight: 3536 oz

## 2022-03-30 LAB — CBC WITH DIFFERENTIAL/PLATELET
Abs Immature Granulocytes: 0.06 10*3/uL (ref 0.00–0.07)
Basophils Absolute: 0 10*3/uL (ref 0.0–0.1)
Basophils Relative: 0 %
Eosinophils Absolute: 0.2 10*3/uL (ref 0.0–0.5)
Eosinophils Relative: 2 %
HCT: 45 % (ref 36.0–46.0)
Hemoglobin: 14.9 g/dL (ref 12.0–15.0)
Immature Granulocytes: 1 %
Lymphocytes Relative: 23 %
Lymphs Abs: 2.4 10*3/uL (ref 0.7–4.0)
MCH: 28.5 pg (ref 26.0–34.0)
MCHC: 33.1 g/dL (ref 30.0–36.0)
MCV: 86.2 fL (ref 80.0–100.0)
Monocytes Absolute: 0.7 10*3/uL (ref 0.1–1.0)
Monocytes Relative: 7 %
Neutro Abs: 7.2 10*3/uL (ref 1.7–7.7)
Neutrophils Relative %: 67 %
Platelets: 425 10*3/uL — ABNORMAL HIGH (ref 150–400)
RBC: 5.22 MIL/uL — ABNORMAL HIGH (ref 3.87–5.11)
RDW: 16 % — ABNORMAL HIGH (ref 11.5–15.5)
WBC: 10.7 10*3/uL — ABNORMAL HIGH (ref 4.0–10.5)
nRBC: 0 % (ref 0.0–0.2)

## 2022-03-30 LAB — COMPREHENSIVE METABOLIC PANEL
ALT: 20 U/L (ref 0–44)
AST: 29 U/L (ref 15–41)
Albumin: 3.5 g/dL (ref 3.5–5.0)
Alkaline Phosphatase: 90 U/L (ref 38–126)
Anion gap: 10 (ref 5–15)
BUN: 6 mg/dL (ref 6–20)
CO2: 26 mmol/L (ref 22–32)
Calcium: 8.6 mg/dL — ABNORMAL LOW (ref 8.9–10.3)
Chloride: 104 mmol/L (ref 98–111)
Creatinine, Ser: 0.78 mg/dL (ref 0.44–1.00)
GFR, Estimated: >60 mL/min (ref >60)
Glucose, Bld: 103 mg/dL — ABNORMAL HIGH (ref 70–99)
Potassium: 3.1 mmol/L — ABNORMAL LOW (ref 3.5–5.1)
Sodium: 140 mmol/L (ref 135–145)
Total Bilirubin: 0.7 mg/dL (ref 0.3–1.2)
Total Protein: 6.6 g/dL (ref 6.5–8.1)

## 2022-03-30 LAB — HEPATIC FUNCTION PANEL
ALT: 18 U/L (ref 0–44)
AST: 30 U/L (ref 15–41)
Albumin: 3.7 g/dL (ref 3.5–5.0)
Alkaline Phosphatase: 96 U/L (ref 38–126)
Bilirubin, Direct: 0.1 mg/dL (ref 0.0–0.2)
Indirect Bilirubin: 0.5 mg/dL (ref 0.3–0.9)
Total Bilirubin: 0.6 mg/dL (ref 0.3–1.2)
Total Protein: 7.1 g/dL (ref 6.5–8.1)

## 2022-03-30 LAB — RAPID URINE DRUG SCREEN, HOSP PERFORMED
Amphetamines: NOT DETECTED
Barbiturates: NOT DETECTED
Benzodiazepines: NOT DETECTED
Cocaine: POSITIVE — AB
Opiates: NOT DETECTED
Tetrahydrocannabinol: NOT DETECTED

## 2022-03-30 LAB — TROPONIN I (HIGH SENSITIVITY): Troponin I (High Sensitivity): 12 ng/L (ref <18)

## 2022-03-30 LAB — LIPID PANEL
Cholesterol: 186 mg/dL (ref 0–200)
HDL: 45 mg/dL (ref >40)
LDL Cholesterol: 115 mg/dL — ABNORMAL HIGH (ref 0–99)
Total CHOL/HDL Ratio: 4.1 RATIO
Triglycerides: 132 mg/dL (ref <150)
VLDL: 26 mg/dL (ref 0–40)

## 2022-03-30 LAB — RESP PANEL BY RT-PCR (FLU A&B, COVID) ARPGX2
Influenza A by PCR: NEGATIVE
Influenza B by PCR: NEGATIVE
SARS Coronavirus 2 by RT PCR: NEGATIVE

## 2022-03-30 LAB — MAGNESIUM: Magnesium: 2.1 mg/dL (ref 1.7–2.4)

## 2022-03-30 LAB — LIPASE, BLOOD: Lipase: 30 U/L (ref 11–51)

## 2022-03-30 MED ORDER — FENTANYL CITRATE PF 50 MCG/ML IJ SOSY
50.0000 ug | PREFILLED_SYRINGE | INTRAMUSCULAR | Status: DC | PRN
  Administered 2022-03-31: 50 ug via INTRAVENOUS
  Filled 2022-03-30: qty 1

## 2022-03-30 MED ORDER — ACETAMINOPHEN 325 MG PO TABS
650.0000 mg | ORAL_TABLET | ORAL | Status: DC | PRN
  Administered 2022-03-31: 650 mg via ORAL
  Filled 2022-03-30: qty 2

## 2022-03-30 MED ORDER — SODIUM CHLORIDE 0.9 % IV SOLN: INTRAVENOUS | Status: DC

## 2022-03-30 MED ORDER — ACETAMINOPHEN 325 MG PO TABS: 650.0000 mg | ORAL_TABLET | Freq: Four times a day (QID) | ORAL | Status: DC | PRN

## 2022-03-30 MED ORDER — AMLODIPINE BESYLATE 5 MG PO TABS
5.0000 mg | ORAL_TABLET | Freq: Every day | ORAL | Status: DC
  Administered 2022-03-30 – 2022-03-31 (×2): 5 mg via ORAL
  Filled 2022-03-30 (×2): qty 1

## 2022-03-30 MED ORDER — HEPARIN SODIUM (PORCINE) 5000 UNIT/ML IJ SOLN
5000.0000 [IU] | Freq: Three times a day (TID) | INTRAMUSCULAR | Status: DC
  Administered 2022-03-30 – 2022-03-31 (×4): 5000 [IU] via SUBCUTANEOUS
  Filled 2022-03-30 (×4): qty 1

## 2022-03-30 MED ORDER — ALUM & MAG HYDROXIDE-SIMETH 200-200-20 MG/5ML PO SUSP
30.0000 mL | Freq: Once | ORAL | Status: AC
  Administered 2022-03-30: 30 mL via ORAL
  Filled 2022-03-30: qty 30

## 2022-03-30 MED ORDER — POTASSIUM CHLORIDE CRYS ER 20 MEQ PO TBCR
40.0000 meq | EXTENDED_RELEASE_TABLET | Freq: Once | ORAL | Status: AC
Start: 2022-03-30 — End: 2022-03-30
  Administered 2022-03-30: 40 meq via ORAL
  Filled 2022-03-30: qty 2

## 2022-03-30 MED ORDER — LIDOCAINE VISCOUS HCL 2 % MT SOLN
15.0000 mL | Freq: Once | OROMUCOSAL | Status: AC
  Administered 2022-03-30: 15 mL via ORAL
  Filled 2022-03-30: qty 15

## 2022-03-30 MED ORDER — METHADONE HCL 5 MG PO TABS: 15.0000 mg | ORAL_TABLET | Freq: Three times a day (TID) | ORAL | Status: DC | PRN

## 2022-03-30 MED ORDER — NITROGLYCERIN 0.4 MG SL SUBL: 0.4000 mg | SUBLINGUAL_TABLET | SUBLINGUAL | Status: DC | PRN

## 2022-03-30 MED ORDER — POTASSIUM CHLORIDE 10 MEQ/100ML IV SOLN
10.0000 meq | INTRAVENOUS | Status: AC
  Administered 2022-03-30 (×2): 10 meq via INTRAVENOUS
  Filled 2022-03-30 (×2): qty 100

## 2022-03-30 MED ORDER — TAMOXIFEN CITRATE 20 MG PO TABS: 20.0000 mg | ORAL_TABLET | Freq: Every day | ORAL | Status: DC

## 2022-03-30 MED ORDER — FLEET PEDIATRIC 3.5-9.5 GM/59ML RE ENEM
1.0000 | ENEMA | Freq: Once | RECTAL | Status: DC
Start: 2022-03-30 — End: 2022-03-31
  Filled 2022-03-30: qty 1

## 2022-03-30 MED ORDER — ONDANSETRON HCL 4 MG/2ML IJ SOLN
4.0000 mg | Freq: Once | INTRAMUSCULAR | Status: AC
  Administered 2022-03-30: 4 mg via INTRAVENOUS
  Filled 2022-03-30: qty 2

## 2022-03-30 MED ORDER — MELOXICAM 15 MG PO TABS
15.0000 mg | ORAL_TABLET | Freq: Every day | ORAL | Status: DC
  Administered 2022-03-30 – 2022-03-31 (×2): 15 mg via ORAL
  Filled 2022-03-30 (×2): qty 1

## 2022-03-30 MED ORDER — ASPIRIN EC 81 MG PO TBEC
81.0000 mg | DELAYED_RELEASE_TABLET | Freq: Every day | ORAL | Status: DC
  Administered 2022-03-30 – 2022-03-31 (×2): 81 mg via ORAL
  Filled 2022-03-30 (×2): qty 1

## 2022-03-30 MED ORDER — ATORVASTATIN CALCIUM 40 MG PO TABS: 40.0000 mg | ORAL_TABLET | Freq: Every day | ORAL | Status: DC

## 2022-03-30 MED ORDER — METOPROLOL SUCCINATE ER 100 MG PO TB24
100.0000 mg | ORAL_TABLET | Freq: Every day | ORAL | Status: DC
  Administered 2022-03-30 – 2022-03-31 (×2): 100 mg via ORAL
  Filled 2022-03-30: qty 1
  Filled 2022-03-30: qty 2

## 2022-03-30 MED ORDER — ACETAMINOPHEN 650 MG RE SUPP: 650.0000 mg | Freq: Four times a day (QID) | RECTAL | Status: DC | PRN

## 2022-03-30 MED ORDER — MAGNESIUM SULFATE 2 GM/50ML IV SOLN
2.0000 g | Freq: Once | INTRAVENOUS | Status: AC
  Administered 2022-03-30: 2 g via INTRAVENOUS
  Filled 2022-03-30: qty 50

## 2022-03-30 MED ORDER — ELDERBERRY 500 MG PO CAPS
1000.0000 mg | ORAL_CAPSULE | Freq: Every day | ORAL | Status: DC
Start: 2022-03-30 — End: 2022-03-30

## 2022-03-30 MED ORDER — ALLOPURINOL 300 MG PO TABS
300.0000 mg | ORAL_TABLET | Freq: Every day | ORAL | Status: DC
  Administered 2022-03-30 – 2022-03-31 (×2): 300 mg via ORAL
  Filled 2022-03-30 (×2): qty 1

## 2022-03-30 MED ORDER — ONDANSETRON HCL 4 MG/2ML IJ SOLN
4.0000 mg | Freq: Four times a day (QID) | INTRAMUSCULAR | Status: DC | PRN
Start: 2022-03-30 — End: 2022-03-30

## 2022-03-30 MED ORDER — SENNOSIDES-DOCUSATE SODIUM 8.6-50 MG PO TABS
2.0000 | ORAL_TABLET | Freq: Two times a day (BID) | ORAL | Status: DC
  Administered 2022-03-30 – 2022-03-31 (×3): 2 via ORAL
  Filled 2022-03-30 (×3): qty 2

## 2022-03-30 MED ORDER — ISOSORBIDE MONONITRATE ER 60 MG PO TB24
60.0000 mg | ORAL_TABLET | Freq: Every day | ORAL | Status: DC
  Administered 2022-03-30: 60 mg via ORAL
  Filled 2022-03-30: qty 1

## 2022-03-30 MED ORDER — SENNOSIDES-DOCUSATE SODIUM 8.6-50 MG PO TABS: 2.0000 | ORAL_TABLET | Freq: Every day | ORAL | Status: DC

## 2022-03-30 MED ORDER — ATORVASTATIN CALCIUM 40 MG PO TABS
80.0000 mg | ORAL_TABLET | Freq: Every day | ORAL | Status: DC
  Administered 2022-03-30: 80 mg via ORAL
  Filled 2022-03-30: qty 2

## 2022-03-30 MED ORDER — ONDANSETRON HCL 4 MG/2ML IJ SOLN
4.0000 mg | Freq: Four times a day (QID) | INTRAMUSCULAR | Status: DC | PRN
Start: 2022-03-30 — End: 2022-03-31

## 2022-03-30 NOTE — ED Notes (Signed)
Given 2 frozen ices. Informed patient fleet enema has not arrived yet from pharmacy.  ?

## 2022-03-30 NOTE — ED Notes (Addendum)
Echo at bedside. Waiting on medication to be tubed from pharmacy.  ?

## 2022-03-30 NOTE — Progress Notes (Signed)
*  PRELIMINARY RESULTS* ?Echocardiogram ?2D Echocardiogram has been performed. ? ?[name] ?03/30/2022, 3:37 PM ?

## 2022-03-30 NOTE — ED Notes (Signed)
Provided w/ breakfast tray.  ?

## 2022-03-30 NOTE — ED Notes (Signed)
Attempting to take vitals to update, patient attempting to eat lunch and moving arm so vitals are inaccurate, remain unverified. Will try again after lunch.  ?

## 2022-03-30 NOTE — Assessment & Plan Note (Signed)
-   Continue statins ?-LDL 115 ? ?

## 2022-03-30 NOTE — ED Notes (Signed)
Medication remains unverified by pharmacy, will send a message.  ?

## 2022-03-30 NOTE — Assessment & Plan Note (Signed)
-   Patient reports bleeding has improved, 2 lesions has been notified on further urine ?-Plan for hysterectomy next week ?-Needs cardiology clearance ? ?-On this admission will evaluate, cardiology consulted ?To be cleared to proceed with her surgery next week ?

## 2022-03-30 NOTE — Assessment & Plan Note (Signed)
-   Patient reports that she has not had a bowel movement for past 3-5 days only small pellets ?-Senokot 2 tabs p.o. twice daily, as needed Fleet enema ?-We will monitor closely ?

## 2022-03-30 NOTE — Assessment & Plan Note (Signed)
Body mass index is 36.78 kg/m?. ?-Patient has been counseled regarding healthy diet and exercise ?-Continue discussion with PCP regarding weight loss ?

## 2022-03-30 NOTE — Assessment & Plan Note (Signed)
-   81.6, 2 g magnesium IV given ?Monitoring ?

## 2022-03-30 NOTE — Progress Notes (Signed)
PHARMACIST - PHYSICIAN ORDER COMMUNICATION ? ?CONCERNING: P&T Medication Policy on Herbal Medications ? ?DESCRIPTION:  This patient?s order for:  [name]  has been noted. ? ?This product(s) is classified as an ?herbal? or natural product. ?Due to a lack of definitive safety studies or FDA approval, nonstandard manufacturing practices, plus the potential risk of unknown drug-drug interactions while on inpatient medications, the Pharmacy and Therapeutics Committee does not permit the use of ?herbal? or natural products of this type within [facility]. ?  ?ACTION TAKEN: ?The pharmacy department is unable to verify this order at this time and your patient has been informed of this safety policy. ?Please reevaluate patient?s clinical condition at discharge and address if the herbal or natural product(s) should be resumed at that time. ? ?Lorenso Courier, PharmD ?Clinical Pharmacist ?03/30/2022 1:07 PM ? ? ? ? ? ?

## 2022-03-30 NOTE — ED Notes (Addendum)
Pharmacy messaged again regarding un-verified home medications. Patient transported to RR via steady, will obtain a urine sample.  ?

## 2022-03-30 NOTE — Assessment & Plan Note (Signed)
-   Admitted to telemetry floor ?-Cycle cardiac enzymes thus far troponin 10, 10, 12 ?-Repeating EKG as needed ?-As needed nitroglycerin, morphine as needed ?-Continue home medication aspirin, statin,  ? ?Chart reviewed, previous history of coronary artery disease with 2 stents, MRI 2008, last echo within normal limits in 2014, ejection fraction 60-65% ? ?-Cardiology consulted for evaluation recommendation ? ? ?

## 2022-03-30 NOTE — Hospital Course (Signed)
? ?  [name] is a 61 year old female with extensive history of coronary artery disease s/p PCI with 2 stents, prior STEMI in 2008, breast cancer(bilateral mastectomy, s/p chemo/radiation 08/09/2011), current URI lesions, HTN, HLD, chronic anemia, arthritis, cardiomyopathy, gout... Presented with acute onset of chest pain. ? ?Patient describes the pain as sharp substernal, at rest, nonradiating, started today, worse overnight not associate with any shortness of breath or cold sweating.  Relieved by nitroglycerin.  Patient reports pain is similar to her previous MI. ? ?Currently complaining of 5 out of 10 substernal chest pain with no radiation ? ?ED: ?Blood pressure 120/87, pulse (!) 101, temperature 98.3 ?F (36.8 ?C), temperature source Oral, resp. rate (!) 26, height '5\' 5"'$  (1.651 m), weight 100.2 kg, last menstrual period 08/07/2011, SpO2 92 %. ? ?CBC, CMP reviewed ?Potassium 2.5, 3.1, --- will supplement p.o. and IV potassium 40 mEq, ?Hemoglobin 10.7, ?Magnesium 1.62 g magnesium IV, 2.1 now, ?Proning 10, 10, 12, ?LDL 115, HDL 45 ?Respiratory panel influenza A, B, SARS-CoV-2... Negative ? ?EKG reviewed -mildly tachycardic, HR 105, negative for any ST elevation depression, subtle changes in inferior lead ?

## 2022-03-30 NOTE — Assessment & Plan Note (Signed)
-   Potassium on admission 2.5, 40 mg and IV KCl given, currently potassium 3.1 >> addition 40 mg KCl p.o. will be given ?-Exam 1.6, 2 g magnesium IV has been given ? ? ?
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
## 2022-03-30 NOTE — Assessment & Plan Note (Signed)
-   Coronary artery disease, MI in 2008, with 2 stents ?-Continue home medication of aspirin, statin and beta-blockers ?
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
## 2022-03-30 NOTE — Assessment & Plan Note (Addendum)
-   BP mildly elevated ?-Resuming home medications including Norvasc, Imdur, Toprol-XL ?-As needed IV hydralazine ?

## 2022-03-30 NOTE — Assessment & Plan Note (Signed)
-   History of bilateral mastectomy, with history of breast cancer ?-In remission ?

## 2022-03-30 NOTE — ED Notes (Signed)
Admission medications have not been verified, unable to administer at this time.  ?

## 2022-03-30 NOTE — Progress Notes (Signed)
?  Carryover admission to the Day Admitter.  I discussed this case with the EDP, Pati Gallo, PA. Per these discussions: ? ? ?This is a 61 year old female with history of coronary artery disease status post PCI with stent prior STEMI, who is being admitted for further evaluation management of presenting atypical chest pain.  She been experiencing 1 day of constant, sharp substernal chest pain, that appears to be pleuritic, nonexertional, and nonresponsive to nitroglycerin administered in the ED this evening.  However, the patient reports that his chest pain is very similar to that which she was experiencing at the time of her prior STEMI.  Has been associated with some nausea and nonbloody, nonbilious emesis x2 episodes. ? ?High-sensitivity troponin I x2 have both been found to be 10.  EKG shows no evidence of acute T wave or ST changes, including no evidence of ST elevation.  Chest x-ray reportedly shows no evidence of acute cardiopulmonary process.  D-dimer reportedly nonelevated. ? ?Additional labs notable for hypokalemia, with presenting potassium level 2.5, as well as mild hypomagnesemia.  She has received 4 mEq of oral potassium chloride and is currently receiving 20 mill equivalents of IV potassium chloride.  Also has ordered for 2 g of IV magnesium sulfate. ? ?I have placed an order for overnight observation for further evaluation management of presenting atypical chest pain. ? ?I have placed some additional preliminary admit orders via the adult multi-morbid admission order set. I have also ordered morning labs, including repeat troponin in the morning.  Have also ordered 20 additional milliequivalents of IV potassium chloride.  Prn IV fentanyl.  Prn Zofran. ? ? ? ?Babs Bertin, DO ?Hospitalist  ?

## 2022-03-31 ENCOUNTER — Encounter (HOSPITAL_COMMUNITY): Payer: Self-pay | Admitting: Family Medicine

## 2022-03-31 DIAGNOSIS — F1410 Cocaine abuse, uncomplicated: Secondary | ICD-10-CM

## 2022-03-31 DIAGNOSIS — F1120 Opioid dependence, uncomplicated: Secondary | ICD-10-CM

## 2022-03-31 DIAGNOSIS — R072 Precordial pain: Secondary | ICD-10-CM

## 2022-03-31 DIAGNOSIS — E876 Hypokalemia: Secondary | ICD-10-CM | POA: Diagnosis not present

## 2022-03-31 DIAGNOSIS — M170 Bilateral primary osteoarthritis of knee: Secondary | ICD-10-CM | POA: Diagnosis not present

## 2022-03-31 DIAGNOSIS — R079 Chest pain, unspecified: Secondary | ICD-10-CM | POA: Diagnosis not present

## 2022-03-31 DIAGNOSIS — Z20822 Contact with and (suspected) exposure to covid-19: Secondary | ICD-10-CM | POA: Diagnosis not present

## 2022-03-31 DIAGNOSIS — G44209 Tension-type headache, unspecified, not intractable: Secondary | ICD-10-CM

## 2022-03-31 HISTORY — DX: Cocaine abuse, uncomplicated: F14.10

## 2022-03-31 LAB — BASIC METABOLIC PANEL
Anion gap: 8 (ref 5–15)
BUN: 10 mg/dL (ref 6–20)
CO2: 25 mmol/L (ref 22–32)
Calcium: 8.2 mg/dL — ABNORMAL LOW (ref 8.9–10.3)
Chloride: 111 mmol/L (ref 98–111)
Creatinine, Ser: 0.68 mg/dL (ref 0.44–1.00)
GFR, Estimated: >60 mL/min (ref >60)
Glucose, Bld: 96 mg/dL (ref 70–99)
Potassium: 3.5 mmol/L (ref 3.5–5.1)
Sodium: 144 mmol/L (ref 135–145)

## 2022-03-31 MED ORDER — METHOCARBAMOL 1000 MG/10ML IJ SOLN
500.0000 mg | Freq: Once | INTRAVENOUS | Status: AC
  Administered 2022-03-31: 500 mg via INTRAVENOUS
  Filled 2022-03-31: qty 500

## 2022-03-31 NOTE — Plan of Care (Signed)
?  Problem: Clinical Measurements: Goal: Cardiovascular complication will be avoided Outcome: Progressing   Problem: Activity: Goal: Risk for activity intolerance will decrease Outcome: Progressing   Problem: Pain Managment: Goal: General experience of comfort will improve Outcome: Progressing   Problem: Safety: Goal: Ability to remain free from injury will improve Outcome: Progressing   

## 2022-03-31 NOTE — Plan of Care (Signed)
PIV removed. DC paperwork reviewed. Pt gathered belongings ? ? ?Problem: Education: ?Goal: Knowledge of General Education information will improve ?Description: Including pain rating scale, medication(s)/side effects and non-pharmacologic comfort measures ?Outcome: Completed/Met ?  ?Problem: Health Behavior/Discharge Planning: ?Goal: Ability to manage health-related needs will improve ?Outcome: Completed/Met ?  ?Problem: Clinical Measurements: ?Goal: Ability to maintain clinical measurements within normal limits will improve ?Outcome: Completed/Met ?Goal: Cardiovascular complication will be avoided ?Outcome: Completed/Met ?  ?Problem: Activity: ?Goal: Risk for activity intolerance will decrease ?Outcome: Completed/Met ?  ?Problem: Nutrition: ?Goal: Adequate nutrition will be maintained ?Outcome: Completed/Met ?  ?Problem: Pain Managment: ?Goal: General experience of comfort will improve ?Outcome: Completed/Met ?  ?Problem: Safety: ?Goal: Ability to remain free from injury will improve ?Outcome: Completed/Met ?  ?

## 2022-03-31 NOTE — Discharge Summary (Signed)
Physician Discharge Summary  ?[name] JHE:[id number] DOB: [birth year] DOA: 03/29/2022 ? ?PCP: [name], MD ? ?Admit date: 03/29/2022 ?Discharge date: 03/31/2022 ? ?Admitted From: Home ?Disposition:  Home ? ?Recommendations for Outpatient Follow-up:  ?Follow up with PCP in 1 week ?Follow-up with cardiology 4/27.  Planning for ischemic work-up prior to surgery ? ?Discharge Condition: Stable ?CODE STATUS: Full  ?Diet recommendation: Heart healthy  ? ?Brief/Interim Summary: ?From H&P by Dr. Roger Shelter: "[name] is a 61 year old female with extensive history of coronary artery disease s/p PCI with 2 stents, prior STEMI in 2008, breast cancer(bilateral mastectomy, s/p chemo/radiation 08/09/2011), current URI lesions, HTN, HLD, chronic anemia, arthritis, cardiomyopathy, gout... Presented with acute onset of chest pain. ?  ?Patient describes the pain as sharp substernal, at rest, nonradiating, started today, worse overnight not associate with any shortness of breath or cold sweating.  Relieved by nitroglycerin.  Patient reports pain is similar to her previous MI. ?  ?Currently complaining of 5 out of 10 substernal chest pain with no radiation" ? ?Interim: Troponins were checked which remained negative.  EKG without concerning signs for ACS.  Cardiology was consulted.  Echocardiogram without regional wall motion abnormalities.  Cardiology signed off, no inpatient work-up planned.  Potassium was replaced.  UDS positive for cocaine.  Patient follow-up with cardiology, recommended to sustain from drug use for complete risks stratification prior to her planned outpatient surgery. ? ?Discharge Diagnoses:  ?Principal Problem: ?  Chest pain ?Active Problems: ?  Hypokalemia ?  Hypertensive disorder ?  Hypomagnesemia ?  Constipation ?  CAD (coronary artery disease) ?  Hyperlipidemia ?  Dysfunctional uterine bleeding ?  Acquired absence of both breasts ?  BMI 40.0-44.9, adult ([place]) ?  Cocaine abuse ([place]) ?  Methadone  dependence ([place]) ?  Tension headache ? ? ? ?Discharge Instructions ? ?Discharge Instructions   ? ? Call MD for:  difficulty breathing, headache or visual disturbances   Complete by: As directed ?  ? Call MD for:  extreme fatigue   Complete by: As directed ?  ? Call MD for:  persistant dizziness or light-headedness   Complete by: As directed ?  ? Call MD for:  persistant nausea and vomiting   Complete by: As directed ?  ? Call MD for:  severe uncontrolled pain   Complete by: As directed ?  ? Call MD for:  temperature >100.4   Complete by: As directed ?  ? Diet - low sodium heart healthy   Complete by: As directed ?  ? Discharge instructions   Complete by: As directed ?  ? You were cared for by a hospitalist during your hospital stay. If you have any questions about your discharge medications or the care you received while you were in the hospital after you are discharged, you can call the unit and ask to speak with the hospitalist on call if the hospitalist that took care of you is not available. Once you are discharged, your primary care physician will handle any further medical issues. Please note that NO REFILLS for any discharge medications will be authorized once you are discharged, as it is imperative that you return to your primary care physician (or establish a relationship with a primary care physician if you do not have one) for your aftercare needs so that they can reassess your need for medications and monitor your lab values.  ? Increase activity slowly   Complete by: As directed ?  ? ?  ? ?Allergies as  of 03/31/2022   ?No Known Allergies ?  ? ?  ?Medication List  ?  ? ?STOP taking these medications   ? ?ibuprofen 800 MG tablet ?Commonly known as: ADVIL ?  ?senna-docusate 8.6-50 MG tablet ?Commonly known as: Senokot-S ?  ? ?  ? ?TAKE these medications   ? ?acetaminophen 500 MG tablet ?Commonly known as: TYLENOL ?Take 2 tablets (1,000 mg total) by mouth every 8 (eight) hours as needed. ?  ?allopurinol 300  MG tablet ?Commonly known as: ZYLOPRIM ?TAKE 1 TABLET BY MOUTH EVERY DAY ?  ?amLODipine 5 MG tablet ?Commonly known as: NORVASC ?Take 1 tablet (5 mg total) by mouth daily. ?  ?ascorbic acid 500 MG tablet ?Commonly known as: VITAMIN C ?Take 500 mg by mouth daily. ?  ?aspirin EC 81 MG tablet ?Take 81 mg by mouth daily. ?  ?atorvastatin 40 MG tablet ?Commonly known as: LIPITOR ?Take 1 tablet (40 mg total) by mouth daily at 6 PM. ?  ?camphor-menthol lotion ?Commonly known as: SARNA ?Apply topically as needed for itching. ?  ?cholecalciferol 25 MCG (1000 UNIT) tablet ?Commonly known as: VITAMIN D3 ?Take 1,000 Units by mouth daily. ?  ?ELDERBERRY PO ?Take 1,000 mg by mouth daily. ?  ?Ginkgo Biloba 120 MG Tabs ?Take 120 mg by mouth daily. ?  ?isosorbide mononitrate 60 MG 24 hr tablet ?Commonly known as: IMDUR ?Take 1 tablet (60 mg total) by mouth at bedtime. ?  ?Klor-Con M20 20 MEQ tablet ?Generic drug: potassium chloride SA ?TAKE 1 TABLET BY MOUTH THREE TIMES A DAY ?  ?medroxyPROGESTERone 10 MG tablet ?Commonly known as: Provera ?Take 2 tablets (20 mg total) by mouth daily. ?  ?meloxicam 15 MG tablet ?Commonly known as: MOBIC ?Take 1 tablet (15 mg total) by mouth daily. ?  ?methadone 5 MG tablet ?Commonly known as: DOLOPHINE ?Take 3 tablets by mouth every 8  hours as needed. ?What changed: reasons to take this ?  ?metoprolol succinate 50 MG 24 hr tablet ?Commonly known as: TOPROL-XL ?TAKE 2 TABLETS (100 MG TOTAL) BY MOUTH DAILY. TAKE WITH OR IMMEDIATELY FOLLOWING A MEAL. ?  ?nitroGLYCERIN 0.4 MG SL tablet ?Commonly known as: NITROSTAT ?Place 1 tablet (0.4 mg total) under the tongue every 5 (five) minutes as needed for chest pain. ?  ?OVER THE COUNTER MEDICATION ?Take 30 mg by mouth once a week. Zinc ?  ?tamoxifen 20 MG tablet ?Commonly known as: NOLVADEX ?Take 1 tablet (20 mg total) by mouth daily. ?  ? ?  ? ? Follow-up Information   ? ? Leanor Kail, PA. Go on 04/15/2022.   ?Specialty: Cardiology ?Contact  information: ?[address] ?[address] ?[place] [address] ?[phone] ? ? ?  ?  ? ? [name], MD Follow up.   ?Specialty: Family Medicine ?Contact information: ?[address] [place] [address] ?[phone] ? ? ?  ?  ? ?  ?  ? ?  ? ?No Known Allergies ? ?Consultations: ?Cardiology  ? ? ?Procedures/Studies: ?DG Chest 2 View ? ?Result Date: 03/29/2022 ?CLINICAL DATA:  Chest pain. EXAM: CHEST - 2 VIEW COMPARISON:  December 21, 2021 FINDINGS: There is no evidence of acute infiltrate, pleural effusion or pneumothorax. The heart size and mediastinal contours are within normal limits. Radiopaque surgical clips are seen along the left axilla. Multilevel degenerative changes are noted throughout the thoracic spine. The visualized skeletal structures are otherwise unremarkable. IMPRESSION: No active cardiopulmonary disease. Electronically Signed   By: Virgina Norfolk M.D.   On:  03/29/2022 21:44  ? ?ECHOCARDIOGRAM COMPLETE ? ?Result Date: 03/30/2022 ?   ECHOCARDIOGRAM REPORT   Patient Name:   [name] Date of Exam: 03/30/2022 Medical Rec #:  [id number]       Height:       65.0 in Accession #:    [id number]      Weight:       221.0 lb Date of Birth:  [birth year]      BSA:          2.064 m? Patient Age:    35 years        BP:           145/95 mmHg Patient Gender: F               HR:           91 bpm. Exam Location:  Inpatient Procedure: 2D Echo, Cardiac Doppler and Color Doppler Indications:    Chest Pain  History:        Patient has prior history of Echocardiogram examinations, most                 recent 09/28/2019. Risk Factors:Hypertension, Dyslipidemia and                 Prediabetes. Hx of COVID-19, Bilateral mastectomy, Breast                 cancer.  Sonographer:    Alvino Chapel RCS Referring Phys: [place]  1. Left ventricular ejection fraction, by estimation, is 60 to 65%. The left ventricle has normal function. The left ventricle has no regional wall motion abnormalities.  There is mild concentric left ventricular hypertrophy. Left ventricular diastolic parameters are consistent with Grade I diastolic dysfunction (impaired relaxation).  2. Right ventricular systolic function is no

## 2022-04-01 ENCOUNTER — Telehealth: Payer: Self-pay

## 2022-04-01 ENCOUNTER — Ambulatory Visit: Payer: Medicaid Other | Admitting: Student

## 2022-04-01 DIAGNOSIS — M170 Bilateral primary osteoarthritis of knee: Secondary | ICD-10-CM | POA: Diagnosis not present

## 2022-04-01 NOTE — Telephone Encounter (Signed)
Transition Care Management Unsuccessful Follow-up Telephone Call ? ?Date of discharge and from where:  03/31/2022-[facility] ? ?Attempts:  1st Attempt ? ?Reason for unsuccessful TCM follow-up call:  Left voice message ? ?  ?

## 2022-04-02 ENCOUNTER — Telehealth: Payer: Self-pay

## 2022-04-02 ENCOUNTER — Telehealth: Payer: Self-pay | Admitting: *Deleted

## 2022-04-02 DIAGNOSIS — M170 Bilateral primary osteoarthritis of knee: Secondary | ICD-10-CM | POA: Diagnosis not present

## 2022-04-02 NOTE — Telephone Encounter (Signed)
This RN received faxed refill for tamoxifen. ? ?Noted pt is pending surgery for endometrial hyperplasia and per LCC/NP note in March 2023 recommendation given to hold medication at this time. ? ?This RN faxed prescription back to the pharmacy stating no refills at this time. ? ?Prescription discontinued on MAR. ?

## 2022-04-02 NOTE — Telephone Encounter (Signed)
Transition Care Management Unsuccessful Follow-up Telephone Call ? ?Date of discharge and from where:  03/31/2022-[facility]  ? ?Attempts:  2nd Attempt ? ?Reason for unsuccessful TCM follow-up call:  Left voice message ? ?  ?

## 2022-04-02 NOTE — Telephone Encounter (Signed)
Following up with [name] this afternoon. Per NP, RN inquired if she is still taking the provera that Dr. Berline Lopes prescribed. "Yes Ma'am, I'm taking the two pills a day like it says." ?Advised patient that our office will follow up with her after her cardiac appointment on 04/15/22. Instructed to call with any needs. Patient verbalized understanding.  ?

## 2022-04-03 DIAGNOSIS — M170 Bilateral primary osteoarthritis of knee: Secondary | ICD-10-CM | POA: Diagnosis not present

## 2022-04-04 DIAGNOSIS — M170 Bilateral primary osteoarthritis of knee: Secondary | ICD-10-CM | POA: Diagnosis not present

## 2022-04-05 ENCOUNTER — Ambulatory Visit: Payer: Medicaid Other | Admitting: Podiatry

## 2022-04-05 DIAGNOSIS — M170 Bilateral primary osteoarthritis of knee: Secondary | ICD-10-CM | POA: Diagnosis not present

## 2022-04-06 DIAGNOSIS — M170 Bilateral primary osteoarthritis of knee: Secondary | ICD-10-CM | POA: Diagnosis not present

## 2022-04-06 NOTE — Telephone Encounter (Signed)
Transition Care Management Unsuccessful Follow-up Telephone Call ? ?Date of discharge and from where:  03/31/2022-[place]  ? ?Attempts:  3rd Attempt ? ?Reason for unsuccessful TCM follow-up call:  Left voice message ? ?  ?

## 2022-04-07 DIAGNOSIS — M170 Bilateral primary osteoarthritis of knee: Secondary | ICD-10-CM | POA: Diagnosis not present

## 2022-04-08 DIAGNOSIS — M170 Bilateral primary osteoarthritis of knee: Secondary | ICD-10-CM | POA: Diagnosis not present

## 2022-04-08 NOTE — Telephone Encounter (Signed)
Attempted to call pt; call went straight to voicemail without ringing. VM box full. Will send Mychart message,  ?

## 2022-04-09 DIAGNOSIS — M170 Bilateral primary osteoarthritis of knee: Secondary | ICD-10-CM | POA: Diagnosis not present

## 2022-04-10 DIAGNOSIS — M170 Bilateral primary osteoarthritis of knee: Secondary | ICD-10-CM | POA: Diagnosis not present

## 2022-04-11 DIAGNOSIS — M170 Bilateral primary osteoarthritis of knee: Secondary | ICD-10-CM | POA: Diagnosis not present

## 2022-04-12 DIAGNOSIS — M170 Bilateral primary osteoarthritis of knee: Secondary | ICD-10-CM | POA: Diagnosis not present

## 2022-04-13 DIAGNOSIS — M170 Bilateral primary osteoarthritis of knee: Secondary | ICD-10-CM | POA: Diagnosis not present

## 2022-04-14 ENCOUNTER — Telehealth: Payer: Self-pay | Admitting: *Deleted

## 2022-04-14 DIAGNOSIS — M170 Bilateral primary osteoarthritis of knee: Secondary | ICD-10-CM | POA: Diagnosis not present

## 2022-04-14 NOTE — Telephone Encounter (Signed)
Pt called in and stated that she rescheduled her cardiology appointment to 04-22-22 @ 3:30pm.  ?

## 2022-04-15 ENCOUNTER — Other Ambulatory Visit: Payer: Self-pay | Admitting: Family Medicine

## 2022-04-15 ENCOUNTER — Ambulatory Visit: Payer: Medicaid Other | Admitting: Physician Assistant

## 2022-04-15 DIAGNOSIS — M170 Bilateral primary osteoarthritis of knee: Secondary | ICD-10-CM | POA: Diagnosis not present

## 2022-04-16 ENCOUNTER — Other Ambulatory Visit (HOSPITAL_COMMUNITY): Payer: Self-pay

## 2022-04-16 ENCOUNTER — Other Ambulatory Visit: Payer: Self-pay | Admitting: Adult Health

## 2022-04-16 DIAGNOSIS — C50412 Malignant neoplasm of upper-outer quadrant of left female breast: Secondary | ICD-10-CM

## 2022-04-16 DIAGNOSIS — M170 Bilateral primary osteoarthritis of knee: Secondary | ICD-10-CM | POA: Diagnosis not present

## 2022-04-16 MED ORDER — METHADONE HCL 5 MG PO TABS
15.0000 mg | ORAL_TABLET | Freq: Three times a day (TID) | ORAL | 0 refills | Status: DC | PRN
  Filled 2022-04-16 – 2022-04-19 (×2): qty 270, 30d supply, fill #0

## 2022-04-17 DIAGNOSIS — M170 Bilateral primary osteoarthritis of knee: Secondary | ICD-10-CM | POA: Diagnosis not present

## 2022-04-18 DIAGNOSIS — M170 Bilateral primary osteoarthritis of knee: Secondary | ICD-10-CM | POA: Diagnosis not present

## 2022-04-19 ENCOUNTER — Other Ambulatory Visit (HOSPITAL_COMMUNITY): Payer: Self-pay

## 2022-04-19 ENCOUNTER — Ambulatory Visit: Payer: Medicaid Other | Admitting: Podiatry

## 2022-04-19 DIAGNOSIS — M170 Bilateral primary osteoarthritis of knee: Secondary | ICD-10-CM | POA: Diagnosis not present

## 2022-04-20 DIAGNOSIS — M170 Bilateral primary osteoarthritis of knee: Secondary | ICD-10-CM | POA: Diagnosis not present

## 2022-04-21 DIAGNOSIS — M170 Bilateral primary osteoarthritis of knee: Secondary | ICD-10-CM | POA: Diagnosis not present

## 2022-04-22 ENCOUNTER — Ambulatory Visit: Payer: Medicaid Other | Admitting: Nurse Practitioner

## 2022-04-22 DIAGNOSIS — M170 Bilateral primary osteoarthritis of knee: Secondary | ICD-10-CM | POA: Diagnosis not present

## 2022-04-23 DIAGNOSIS — M170 Bilateral primary osteoarthritis of knee: Secondary | ICD-10-CM | POA: Diagnosis not present

## 2022-04-24 ENCOUNTER — Other Ambulatory Visit: Payer: Self-pay | Admitting: Gynecologic Oncology

## 2022-04-24 DIAGNOSIS — N8502 Endometrial intraepithelial neoplasia [EIN]: Secondary | ICD-10-CM

## 2022-04-24 DIAGNOSIS — M170 Bilateral primary osteoarthritis of knee: Secondary | ICD-10-CM | POA: Diagnosis not present

## 2022-04-25 DIAGNOSIS — M170 Bilateral primary osteoarthritis of knee: Secondary | ICD-10-CM | POA: Diagnosis not present

## 2022-04-26 DIAGNOSIS — M170 Bilateral primary osteoarthritis of knee: Secondary | ICD-10-CM | POA: Diagnosis not present
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
## 2022-04-27 ENCOUNTER — Ambulatory Visit: Payer: Medicaid Other | Admitting: Physician Assistant

## 2022-04-27 DIAGNOSIS — I2510 Atherosclerotic heart disease of native coronary artery without angina pectoris: Secondary | ICD-10-CM

## 2022-04-27 DIAGNOSIS — R072 Precordial pain: Secondary | ICD-10-CM

## 2022-04-27 DIAGNOSIS — Z01810 Encounter for preprocedural cardiovascular examination: Secondary | ICD-10-CM

## 2022-04-27 DIAGNOSIS — M170 Bilateral primary osteoarthritis of knee: Secondary | ICD-10-CM | POA: Diagnosis not present

## 2022-04-27 DIAGNOSIS — E785 Hyperlipidemia, unspecified: Secondary | ICD-10-CM

## 2022-04-27 DIAGNOSIS — I10 Essential (primary) hypertension: Secondary | ICD-10-CM

## 2022-04-28 DIAGNOSIS — M170 Bilateral primary osteoarthritis of knee: Secondary | ICD-10-CM | POA: Diagnosis not present

## 2022-04-29 DIAGNOSIS — M170 Bilateral primary osteoarthritis of knee: Secondary | ICD-10-CM | POA: Diagnosis not present

## 2022-04-30 DIAGNOSIS — M170 Bilateral primary osteoarthritis of knee: Secondary | ICD-10-CM | POA: Diagnosis not present

## 2022-05-01 ENCOUNTER — Other Ambulatory Visit: Payer: Self-pay | Admitting: Family Medicine

## 2022-05-01 DIAGNOSIS — M170 Bilateral primary osteoarthritis of knee: Secondary | ICD-10-CM | POA: Diagnosis not present

## 2022-05-01 DIAGNOSIS — I10 Essential (primary) hypertension: Secondary | ICD-10-CM

## 2022-05-01 DIAGNOSIS — Z853 Personal history of malignant neoplasm of breast: Secondary | ICD-10-CM

## 2022-05-01 DIAGNOSIS — M792 Neuralgia and neuritis, unspecified: Secondary | ICD-10-CM

## 2022-05-01 DIAGNOSIS — G8929 Other chronic pain: Secondary | ICD-10-CM

## 2022-05-02 DIAGNOSIS — M170 Bilateral primary osteoarthritis of knee: Secondary | ICD-10-CM | POA: Diagnosis not present

## 2022-05-03 DIAGNOSIS — M170 Bilateral primary osteoarthritis of knee: Secondary | ICD-10-CM | POA: Diagnosis not present

## 2022-05-04 DIAGNOSIS — M170 Bilateral primary osteoarthritis of knee: Secondary | ICD-10-CM | POA: Diagnosis not present

## 2022-05-05 DIAGNOSIS — M170 Bilateral primary osteoarthritis of knee: Secondary | ICD-10-CM | POA: Diagnosis not present

## 2022-05-06 DIAGNOSIS — M170 Bilateral primary osteoarthritis of knee: Secondary | ICD-10-CM | POA: Diagnosis not present

## 2022-05-07 DIAGNOSIS — M170 Bilateral primary osteoarthritis of knee: Secondary | ICD-10-CM | POA: Diagnosis not present

## 2022-05-08 DIAGNOSIS — M170 Bilateral primary osteoarthritis of knee: Secondary | ICD-10-CM | POA: Diagnosis not present

## 2022-05-09 DIAGNOSIS — M170 Bilateral primary osteoarthritis of knee: Secondary | ICD-10-CM | POA: Diagnosis not present

## 2022-05-10 DIAGNOSIS — M170 Bilateral primary osteoarthritis of knee: Secondary | ICD-10-CM | POA: Diagnosis not present

## 2022-05-11 DIAGNOSIS — M170 Bilateral primary osteoarthritis of knee: Secondary | ICD-10-CM | POA: Diagnosis not present

## 2022-05-12 DIAGNOSIS — M170 Bilateral primary osteoarthritis of knee: Secondary | ICD-10-CM | POA: Diagnosis not present

## 2022-05-13 ENCOUNTER — Telehealth: Payer: Self-pay | Admitting: *Deleted

## 2022-05-13 DIAGNOSIS — M170 Bilateral primary osteoarthritis of knee: Secondary | ICD-10-CM | POA: Diagnosis not present

## 2022-05-13 NOTE — Telephone Encounter (Signed)
S/w pt due to being on MSW schedule.  Looks like pt belongs to SW or HS.  Moved to Scott's schedule. Pt is aware of new time and date.

## 2022-05-14 DIAGNOSIS — M170 Bilateral primary osteoarthritis of knee: Secondary | ICD-10-CM | POA: Diagnosis not present

## 2022-05-15 DIAGNOSIS — M170 Bilateral primary osteoarthritis of knee: Secondary | ICD-10-CM | POA: Diagnosis not present

## 2022-05-16 DIAGNOSIS — M170 Bilateral primary osteoarthritis of knee: Secondary | ICD-10-CM | POA: Diagnosis not present

## 2022-05-17 DIAGNOSIS — M170 Bilateral primary osteoarthritis of knee: Secondary | ICD-10-CM | POA: Diagnosis not present

## 2022-05-18 ENCOUNTER — Other Ambulatory Visit (HOSPITAL_COMMUNITY): Payer: Self-pay

## 2022-05-18 ENCOUNTER — Other Ambulatory Visit: Payer: Self-pay | Admitting: Hematology and Oncology

## 2022-05-18 DIAGNOSIS — C50412 Malignant neoplasm of upper-outer quadrant of left female breast: Secondary | ICD-10-CM

## 2022-05-18 DIAGNOSIS — M170 Bilateral primary osteoarthritis of knee: Secondary | ICD-10-CM | POA: Diagnosis not present

## 2022-05-19 ENCOUNTER — Ambulatory Visit: Payer: Medicaid Other | Admitting: Family Medicine

## 2022-05-19 ENCOUNTER — Other Ambulatory Visit: Payer: Self-pay | Admitting: Adult Health

## 2022-05-19 ENCOUNTER — Other Ambulatory Visit (HOSPITAL_COMMUNITY): Payer: Self-pay

## 2022-05-19 DIAGNOSIS — M170 Bilateral primary osteoarthritis of knee: Secondary | ICD-10-CM | POA: Diagnosis not present

## 2022-05-19 DIAGNOSIS — C50412 Malignant neoplasm of upper-outer quadrant of left female breast: Secondary | ICD-10-CM

## 2022-05-19 MED ORDER — METHADONE HCL 5 MG PO TABS
15.0000 mg | ORAL_TABLET | Freq: Three times a day (TID) | ORAL | 0 refills | Status: DC | PRN
  Filled 2022-05-19: qty 270, 30d supply, fill #0

## 2022-05-20 ENCOUNTER — Other Ambulatory Visit (HOSPITAL_COMMUNITY): Payer: Self-pay

## 2022-05-20 DIAGNOSIS — M170 Bilateral primary osteoarthritis of knee: Secondary | ICD-10-CM | POA: Diagnosis not present

## 2022-05-21 DIAGNOSIS — M170 Bilateral primary osteoarthritis of knee: Secondary | ICD-10-CM | POA: Diagnosis not present

## 2022-05-22 DIAGNOSIS — M170 Bilateral primary osteoarthritis of knee: Secondary | ICD-10-CM | POA: Diagnosis not present

## 2022-05-23 DIAGNOSIS — M170 Bilateral primary osteoarthritis of knee: Secondary | ICD-10-CM | POA: Diagnosis not present

## 2022-05-24 ENCOUNTER — Other Ambulatory Visit: Payer: Medicaid Other

## 2022-05-24 ENCOUNTER — Ambulatory Visit: Payer: Medicaid Other | Admitting: Adult Health

## 2022-05-24 ENCOUNTER — Ambulatory Visit: Payer: Medicaid Other | Admitting: Podiatry

## 2022-05-24 DIAGNOSIS — M170 Bilateral primary osteoarthritis of knee: Secondary | ICD-10-CM | POA: Diagnosis not present

## 2022-05-25 ENCOUNTER — Ambulatory Visit: Payer: Medicaid Other | Admitting: Rehabilitation

## 2022-05-25 ENCOUNTER — Encounter: Payer: Self-pay | Admitting: *Deleted

## 2022-05-25 DIAGNOSIS — M170 Bilateral primary osteoarthritis of knee: Secondary | ICD-10-CM | POA: Diagnosis not present

## 2022-05-26 ENCOUNTER — Ambulatory Visit: Payer: Medicaid Other | Admitting: Family Medicine

## 2022-05-26 DIAGNOSIS — M170 Bilateral primary osteoarthritis of knee: Secondary | ICD-10-CM | POA: Diagnosis not present

## 2022-05-27 ENCOUNTER — Ambulatory Visit: Payer: Medicaid Other | Admitting: Nurse Practitioner

## 2022-05-27 DIAGNOSIS — M170 Bilateral primary osteoarthritis of knee: Secondary | ICD-10-CM | POA: Diagnosis not present
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
## 2022-05-28 ENCOUNTER — Ambulatory Visit: Payer: Medicaid Other | Admitting: Physician Assistant

## 2022-05-28 DIAGNOSIS — M170 Bilateral primary osteoarthritis of knee: Secondary | ICD-10-CM | POA: Diagnosis not present

## 2022-05-29 DIAGNOSIS — M170 Bilateral primary osteoarthritis of knee: Secondary | ICD-10-CM | POA: Diagnosis not present

## 2022-05-30 DIAGNOSIS — M170 Bilateral primary osteoarthritis of knee: Secondary | ICD-10-CM | POA: Diagnosis not present

## 2022-05-31 DIAGNOSIS — M170 Bilateral primary osteoarthritis of knee: Secondary | ICD-10-CM | POA: Diagnosis not present

## 2022-06-01 DIAGNOSIS — M170 Bilateral primary osteoarthritis of knee: Secondary | ICD-10-CM | POA: Diagnosis not present

## 2022-06-02 DIAGNOSIS — M170 Bilateral primary osteoarthritis of knee: Secondary | ICD-10-CM | POA: Diagnosis not present

## 2022-06-03 ENCOUNTER — Encounter: Payer: Self-pay | Admitting: Rehabilitation

## 2022-06-03 DIAGNOSIS — M170 Bilateral primary osteoarthritis of knee: Secondary | ICD-10-CM | POA: Diagnosis not present

## 2022-06-04 ENCOUNTER — Ambulatory Visit: Payer: Medicaid Other | Admitting: Family Medicine

## 2022-06-04 DIAGNOSIS — M170 Bilateral primary osteoarthritis of knee: Secondary | ICD-10-CM | POA: Diagnosis not present

## 2022-06-05 DIAGNOSIS — M170 Bilateral primary osteoarthritis of knee: Secondary | ICD-10-CM | POA: Diagnosis not present

## 2022-06-06 DIAGNOSIS — M170 Bilateral primary osteoarthritis of knee: Secondary | ICD-10-CM | POA: Diagnosis not present

## 2022-06-07 ENCOUNTER — Inpatient Hospital Stay: Payer: Medicaid Other | Admitting: Hematology and Oncology

## 2022-06-07 ENCOUNTER — Other Ambulatory Visit (HOSPITAL_COMMUNITY): Payer: Self-pay

## 2022-06-07 ENCOUNTER — Inpatient Hospital Stay: Payer: Medicaid Other

## 2022-06-07 ENCOUNTER — Telehealth: Payer: Self-pay | Admitting: *Deleted

## 2022-06-07 ENCOUNTER — Telehealth: Payer: Self-pay

## 2022-06-07 DIAGNOSIS — N8502 Endometrial intraepithelial neoplasia [EIN]: Secondary | ICD-10-CM

## 2022-06-07 DIAGNOSIS — M170 Bilateral primary osteoarthritis of knee: Secondary | ICD-10-CM | POA: Diagnosis not present

## 2022-06-07 MED ORDER — MEDROXYPROGESTERONE ACETATE 10 MG PO TABS
20.0000 mg | ORAL_TABLET | Freq: Every day | ORAL | 2 refills | Status: DC
  Filled 2022-06-07 – 2022-06-17 (×2): qty 60, 30d supply, fill #0

## 2022-06-07 NOTE — Telephone Encounter (Signed)
Following up with [name] this afternoon regarding her provera prescription. Inquired if patient is still taking medication. Patient states "You know I'm going to be honest, I lost them. I wasn't taking them like I should've. The last time I took it was probably 2 weeks ago. Can you call me in a refill?" Informed patient that we will send in a refill for the provera. Advised patient that Dr. Berline Lopes would like to see her in the office and potentially perform a biopsy since it's been awhile. Patient is agreeable to this. Appointment scheduled for 06/25/22 at 3:15pm. Patient in agreement of appointment date and time. Instructed to call with any needs.

## 2022-06-07 NOTE — Telephone Encounter (Signed)
Patient called and left a message stating "Dr Berline Lopes I wanted to let you know I missed my appt with Dr Chryl Heck this morning. I over slept. I do have an appt with the heart doctor on 7/3 and see my primary this week on 6/21."

## 2022-06-08 ENCOUNTER — Other Ambulatory Visit (HOSPITAL_COMMUNITY): Payer: Self-pay

## 2022-06-08 DIAGNOSIS — M170 Bilateral primary osteoarthritis of knee: Secondary | ICD-10-CM | POA: Diagnosis not present

## 2022-06-09 ENCOUNTER — Encounter: Payer: Self-pay | Admitting: Family Medicine

## 2022-06-09 ENCOUNTER — Ambulatory Visit: Payer: Medicaid Other | Admitting: Family Medicine

## 2022-06-09 ENCOUNTER — Ambulatory Visit (INDEPENDENT_AMBULATORY_CARE_PROVIDER_SITE_OTHER): Payer: Medicaid Other | Admitting: Family Medicine

## 2022-06-09 VITALS — BP 156/100 | HR 81 | Ht 65.0 in | Wt 242.0 lb

## 2022-06-09 DIAGNOSIS — I10 Essential (primary) hypertension: Secondary | ICD-10-CM | POA: Diagnosis not present

## 2022-06-09 DIAGNOSIS — R400 Somnolence: Secondary | ICD-10-CM

## 2022-06-09 DIAGNOSIS — Z853 Personal history of malignant neoplasm of breast: Secondary | ICD-10-CM

## 2022-06-09 DIAGNOSIS — Z9189 Other specified personal risk factors, not elsewhere classified: Secondary | ICD-10-CM

## 2022-06-09 DIAGNOSIS — M170 Bilateral primary osteoarthritis of knee: Secondary | ICD-10-CM | POA: Diagnosis not present

## 2022-06-09 DIAGNOSIS — M792 Neuralgia and neuritis, unspecified: Secondary | ICD-10-CM

## 2022-06-09 DIAGNOSIS — G8929 Other chronic pain: Secondary | ICD-10-CM | POA: Diagnosis not present

## 2022-06-09 DIAGNOSIS — R4586 Emotional lability: Secondary | ICD-10-CM

## 2022-06-09 MED ORDER — AMLODIPINE BESYLATE 5 MG PO TABS
5.0000 mg | ORAL_TABLET | Freq: Every day | ORAL | 3 refills | Status: DC
Start: 2022-06-09 — End: 2022-07-09

## 2022-06-09 MED ORDER — ALLOPURINOL 300 MG PO TABS: 300.0000 mg | ORAL_TABLET | Freq: Every day | ORAL | 2 refills | Status: DC

## 2022-06-09 MED ORDER — POTASSIUM CHLORIDE CRYS ER 20 MEQ PO TBCR: 20.0000 meq | EXTENDED_RELEASE_TABLET | Freq: Three times a day (TID) | ORAL | 0 refills | Status: DC

## 2022-06-09 MED ORDER — ISOSORBIDE MONONITRATE ER 60 MG PO TB24: 60.0000 mg | ORAL_TABLET | Freq: Every day | ORAL | 3 refills | Status: DC

## 2022-06-09 MED ORDER — NITROGLYCERIN 0.4 MG SL SUBL: 0.4000 mg | SUBLINGUAL_TABLET | SUBLINGUAL | 6 refills | Status: AC | PRN

## 2022-06-09 MED ORDER — METOPROLOL SUCCINATE ER 50 MG PO TB24
100.0000 mg | ORAL_TABLET | Freq: Every day | ORAL | 2 refills | Status: DC
Start: 2022-06-09 — End: 2022-07-09

## 2022-06-09 NOTE — Patient Instructions (Signed)
Therapy and Counseling Resources Most providers on this list will take Medicaid. Patients with commercial insurance or Medicare should contact their insurance company to get a list of in network providers.  [facility] (spanish speaking therapist available)(habla espanol)(take medicare and medicaid)  [place], [place], [place] [address], [place] [email or web address] [phone]  [facility] Counseling [address] [place], [place] [address] [place], [place], [place] [address]      [phone]  [place] (spanish available) [place], [place] [address] [place] (take [facility] and medicare) [address], [place], [place] [address]       [phone]     [place] (virtual only) [phone]  [facility] [address], [place], [place]  [facility]:  [place]. [place] [phone]  [facility]:  [place] [address] [phone]  [facility] (under & uninsured) [address] [place] [phone]    [email or web address]    [place] [phone]  [place]    [phone]  [facility] [place]     Phone:  [phone]     [facility]illis [facility]ador  [place] [address]      [place], [place] ext [place]: [place], [place], [place]   [place] ([place] therapist) [email or web address]  [place] [address]   [place] [address]    [phone]    [facility]   [address],  [place], [place]   [place]  [place]  [facility]  [address] [place], [place]        [phone]  Open Access/Walk In Clinic under & uninsured  [facility]  [address] [place], [place] Crisis [phone]  [facility] the [place],  ([place])   [place], [place]: [phone]) 8:30 - 12; 1 - 2:30  [facility] of the [place],  [place], [place]    [phone]):8:30 - 12; 2 - 3PM  [facility] [employer],  [address],  [place]; [phone]):   Mon - Fri 8 AM - 5 PM  [facility] [place]  MWF 12:30 to 3:00 or call to schedule an appointment  [phone]  Specific Provider options Psychology Today  [email or web address] click on find a therapist  enter your zip code left side and select or tailor a therapist for your specific need.   [facility] Provider Directory [email or web address]  (Medicaid)   Follow all drop down to find a provider  [place] or [email or web address] [address], [place], [place] Recovery support and educational   24- Hour Availability:   [facility]  [address] [place], [place] Crisis [phone]  [facility] of the [employer] [phone]  [place]  [phone]   [place]  [phone] (after hours)  Therapeutic Alternative/Mobile Crisis   [phone]  [place] National Suicide  Hotline  [phone] (TALK)  Call 911 or go to emergency room  [facility]  [phone]);  [place] and [employer]  [phone]); [place], [place], [place], [place], [place], [place], [place]

## 2022-06-09 NOTE — Progress Notes (Signed)
     SUBJECTIVE:   CHIEF COMPLAINT / HPI:   [name] is a 61 y.o. female presents for meds refill  Med refills  Been off meds for a few months since April 23. Lost her medications when moving house and could not afford new refills.   Tired Feels tired a lot of the time and sleepy during the day. At night she has been told she  "snores bad.     PERTINENT  PMH / PSH: CAD, HTN, OA  OBJECTIVE:   BP (!) 156/100   Pulse 81   Ht '5\' 5"'$  (1.651 m)   Wt 242 lb (109.8 kg)   LMP 08/07/2011   SpO2 97%   BMI 40.27 kg/m    General: Alert, no acute distress Cardio: Normal S1 and S2, RRR, no r/m/g Pulm: CTAB, normal work of breathing Abdomen: Bowel sounds normal. Abdomen soft and non-tender.  Extremities: No peripheral edema.  Neuro: Cranial nerves grossly intact   ASSESSMENT/PLAN:   Essential hypertension BP elevated to 156/100. Repeat BP not charted. Likely in the setting of being off her medications. Refilled anti-hypertensives and emphasized the importance of compliance. Recommend follow up for BP check in 2 weeks.  At risk for obstructive sleep apnea STOP BANG 6, at high risk for OSA. Likely the cause of daytime fatigue. Referred for sleep studies.  Mood altered Emotionally labile and tearful at times today due to psychosocial stressors. Pt open to therapy, provided list to [facility] therapists. F/u as needed.    [name], MD PGY-3 [place]

## 2022-06-10 DIAGNOSIS — M170 Bilateral primary osteoarthritis of knee: Secondary | ICD-10-CM | POA: Diagnosis not present

## 2022-06-11 ENCOUNTER — Other Ambulatory Visit (HOSPITAL_COMMUNITY): Payer: Self-pay

## 2022-06-11 ENCOUNTER — Other Ambulatory Visit: Payer: Self-pay | Admitting: Adult Health

## 2022-06-11 DIAGNOSIS — M170 Bilateral primary osteoarthritis of knee: Secondary | ICD-10-CM | POA: Diagnosis not present

## 2022-06-11 DIAGNOSIS — C50412 Malignant neoplasm of upper-outer quadrant of left female breast: Secondary | ICD-10-CM

## 2022-06-12 DIAGNOSIS — M170 Bilateral primary osteoarthritis of knee: Secondary | ICD-10-CM | POA: Diagnosis not present

## 2022-06-13 DIAGNOSIS — M170 Bilateral primary osteoarthritis of knee: Secondary | ICD-10-CM | POA: Diagnosis not present

## 2022-06-14 ENCOUNTER — Other Ambulatory Visit (HOSPITAL_COMMUNITY): Payer: Self-pay

## 2022-06-14 DIAGNOSIS — M170 Bilateral primary osteoarthritis of knee: Secondary | ICD-10-CM | POA: Diagnosis not present

## 2022-06-15 ENCOUNTER — Other Ambulatory Visit (HOSPITAL_COMMUNITY): Payer: Self-pay

## 2022-06-15 DIAGNOSIS — M170 Bilateral primary osteoarthritis of knee: Secondary | ICD-10-CM | POA: Diagnosis not present

## 2022-06-15 DIAGNOSIS — Z9189 Other specified personal risk factors, not elsewhere classified: Secondary | ICD-10-CM | POA: Insufficient documentation

## 2022-06-15 DIAGNOSIS — R4586 Emotional lability: Secondary | ICD-10-CM | POA: Insufficient documentation

## 2022-06-15 MED ORDER — METHADONE HCL 5 MG PO TABS
15.0000 mg | ORAL_TABLET | Freq: Three times a day (TID) | ORAL | 0 refills | Status: DC | PRN
  Filled 2022-06-17: qty 270, 30d supply, fill #0

## 2022-06-15 NOTE — Assessment & Plan Note (Signed)
Emotionally labile and tearful at times today due to psychosocial stressors. Pt open to therapy, provided list to [facility] therapists. F/u as needed.

## 2022-06-16 ENCOUNTER — Other Ambulatory Visit: Payer: Self-pay | Admitting: *Deleted

## 2022-06-16 ENCOUNTER — Encounter: Payer: Self-pay | Admitting: Physical Therapy

## 2022-06-16 ENCOUNTER — Other Ambulatory Visit (HOSPITAL_COMMUNITY): Payer: Self-pay

## 2022-06-16 DIAGNOSIS — M170 Bilateral primary osteoarthritis of knee: Secondary | ICD-10-CM | POA: Diagnosis not present

## 2022-06-17 ENCOUNTER — Other Ambulatory Visit (HOSPITAL_COMMUNITY): Payer: Self-pay

## 2022-06-17 DIAGNOSIS — M170 Bilateral primary osteoarthritis of knee: Secondary | ICD-10-CM | POA: Diagnosis not present

## 2022-06-18 DIAGNOSIS — M170 Bilateral primary osteoarthritis of knee: Secondary | ICD-10-CM | POA: Diagnosis not present

## 2022-06-19 DIAGNOSIS — M170 Bilateral primary osteoarthritis of knee: Secondary | ICD-10-CM | POA: Diagnosis not present

## 2022-06-20 DIAGNOSIS — M170 Bilateral primary osteoarthritis of knee: Secondary | ICD-10-CM | POA: Diagnosis not present

## 2022-06-21 ENCOUNTER — Ambulatory Visit: Payer: Medicaid Other | Admitting: Physician Assistant

## 2022-06-21 DIAGNOSIS — M170 Bilateral primary osteoarthritis of knee: Secondary | ICD-10-CM | POA: Diagnosis not present
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
## 2022-06-22 DIAGNOSIS — M170 Bilateral primary osteoarthritis of knee: Secondary | ICD-10-CM | POA: Diagnosis not present

## 2022-06-23 ENCOUNTER — Inpatient Hospital Stay: Payer: Medicaid Other | Attending: Adult Health

## 2022-06-23 ENCOUNTER — Encounter: Payer: Self-pay | Admitting: Hematology and Oncology

## 2022-06-23 ENCOUNTER — Inpatient Hospital Stay (HOSPITAL_BASED_OUTPATIENT_CLINIC_OR_DEPARTMENT_OTHER): Payer: Medicaid Other | Admitting: Hematology and Oncology

## 2022-06-23 ENCOUNTER — Other Ambulatory Visit: Payer: Self-pay

## 2022-06-23 ENCOUNTER — Other Ambulatory Visit (HOSPITAL_COMMUNITY): Payer: Self-pay

## 2022-06-23 DIAGNOSIS — F17210 Nicotine dependence, cigarettes, uncomplicated: Secondary | ICD-10-CM | POA: Diagnosis not present

## 2022-06-23 DIAGNOSIS — G8929 Other chronic pain: Secondary | ICD-10-CM | POA: Diagnosis not present

## 2022-06-23 DIAGNOSIS — C50412 Malignant neoplasm of upper-outer quadrant of left female breast: Secondary | ICD-10-CM | POA: Diagnosis not present

## 2022-06-23 DIAGNOSIS — Z170 Estrogen receptor positive status [ER+]: Secondary | ICD-10-CM | POA: Diagnosis not present

## 2022-06-23 DIAGNOSIS — F1490 Cocaine use, unspecified, uncomplicated: Secondary | ICD-10-CM | POA: Diagnosis not present

## 2022-06-23 DIAGNOSIS — M170 Bilateral primary osteoarthritis of knee: Secondary | ICD-10-CM | POA: Diagnosis not present

## 2022-06-23 DIAGNOSIS — C50811 Malignant neoplasm of overlapping sites of right female breast: Secondary | ICD-10-CM

## 2022-06-23 DIAGNOSIS — Z79891 Long term (current) use of opiate analgesic: Secondary | ICD-10-CM | POA: Insufficient documentation

## 2022-06-23 DIAGNOSIS — N8502 Endometrial intraepithelial neoplasia [EIN]: Secondary | ICD-10-CM | POA: Diagnosis not present

## 2022-06-23 LAB — CMP (CANCER CENTER ONLY)
ALT: 16 U/L (ref 0–44)
AST: 21 U/L (ref 15–41)
Albumin: 3.6 g/dL (ref 3.5–5.0)
Alkaline Phosphatase: 104 U/L (ref 38–126)
Anion gap: 8 (ref 5–15)
BUN: 11 mg/dL (ref 6–20)
CO2: 28 mmol/L (ref 22–32)
Calcium: 9.1 mg/dL (ref 8.9–10.3)
Chloride: 105 mmol/L (ref 98–111)
Creatinine: 0.79 mg/dL (ref 0.44–1.00)
GFR, Estimated: >60 mL/min (ref >60)
Glucose, Bld: 103 mg/dL — ABNORMAL HIGH (ref 70–99)
Potassium: 2.8 mmol/L — ABNORMAL LOW (ref 3.5–5.1)
Sodium: 141 mmol/L (ref 135–145)
Total Bilirubin: 0.5 mg/dL (ref 0.3–1.2)
Total Protein: 6.6 g/dL (ref 6.5–8.1)

## 2022-06-23 LAB — CBC WITH DIFFERENTIAL (CANCER CENTER ONLY)
Abs Immature Granulocytes: 0.03 10*3/uL (ref 0.00–0.07)
Basophils Absolute: 0 10*3/uL (ref 0.0–0.1)
Basophils Relative: 0 %
Eosinophils Absolute: 0.2 10*3/uL (ref 0.0–0.5)
Eosinophils Relative: 3 %
HCT: 46.6 % — ABNORMAL HIGH (ref 36.0–46.0)
Hemoglobin: 15.6 g/dL — ABNORMAL HIGH (ref 12.0–15.0)
Immature Granulocytes: 0 %
Lymphocytes Relative: 26 %
Lymphs Abs: 1.9 10*3/uL (ref 0.7–4.0)
MCH: 28.2 pg (ref 26.0–34.0)
MCHC: 33.5 g/dL (ref 30.0–36.0)
MCV: 84.3 fL (ref 80.0–100.0)
Monocytes Absolute: 0.5 10*3/uL (ref 0.1–1.0)
Monocytes Relative: 7 %
Neutro Abs: 4.6 10*3/uL (ref 1.7–7.7)
Neutrophils Relative %: 64 %
Platelet Count: 432 10*3/uL — ABNORMAL HIGH (ref 150–400)
RBC: 5.53 MIL/uL — ABNORMAL HIGH (ref 3.87–5.11)
RDW: 15.2 % (ref 11.5–15.5)
WBC Count: 7.3 10*3/uL (ref 4.0–10.5)
nRBC: 0 % (ref 0.0–0.2)
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
## 2022-06-23 NOTE — Assessment & Plan Note (Addendum)
[name] is a 61 year old woman who has a history of stage IIIa triple positive breast cancer status post bilateral mastectomies, adjuvant chemotherapy, maintenance trastuzumab, and is currently on treatment with tamoxifen which began in August 2013  1.  Stage IIIa triple positive breast cancer: She has no clinical sign of breast cancer recurrence today.  I did recommend that she go ahead and discontinue tamoxifen considering her endometrial hyperplasia.  She was hesitant to stop tamoxifen per our nurse practitioner's note.  But at this time we are not inclined to refill any more tamoxifen since it will be 10 years from her initiation and there is no data supporting long-term use of tamoxifen beyond 10 years at this time.  We have held this given the endometrial hyperplasia that was discussed during her previous encounters with Dr. Berline Lopes.  Today I have clearly discussed that there is no role for tamoxifen beyond 10 years and she should discontinue it at this time given increased risk of endometrial changes as well as DVT/PE.  She is agreeable.  No examination findings concerning for recurrence.  No role for mammogram since she had bilateral mastectomy.  She can continue follow-up in the survivorship clinic for management for chronic issues.  2.  Endometrial hyperplasia questionable invasive cancer: According to last note from Dr. Berline Lopes, plan for hysterectomy in April but I do not see anything scheduled. \She has a follow-up scheduled with Dr. Berline Lopes at the end of the week she said.  She missed some appointments in the past.  I have strongly encouraged her to keep all appointments and talk to Dr. Berline Lopes about her upcoming hysterectomy  3.  Chronic pain:.  She can continue follow-up in survivorship clinic for chronic pain needs or be referred to pain management clinic.  She prefers to follow-up with Mendel Ryder for her chronic pain needs.  4.  We strongly encourage smoking cessation.  She was encouraged to  discuss with her cardiologist about the ongoing shortness of breath with exertion and she expressed understanding.  She was encouraged to reach out to [name] with any new questions or concerns.  She will return to clinic in 6 months to see Mendel Ryder and as needed with me

## 2022-06-24 ENCOUNTER — Encounter: Payer: Self-pay | Admitting: Gynecologic Oncology

## 2022-06-24 ENCOUNTER — Other Ambulatory Visit (HOSPITAL_COMMUNITY): Payer: Self-pay

## 2022-06-24 DIAGNOSIS — M170 Bilateral primary osteoarthritis of knee: Secondary | ICD-10-CM | POA: Diagnosis not present

## 2022-06-25 ENCOUNTER — Inpatient Hospital Stay: Payer: Medicaid Other | Admitting: Gynecologic Oncology

## 2022-06-25 ENCOUNTER — Telehealth: Payer: Self-pay | Admitting: *Deleted

## 2022-06-25 DIAGNOSIS — N8502 Endometrial intraepithelial neoplasia [EIN]: Secondary | ICD-10-CM

## 2022-06-25 DIAGNOSIS — M170 Bilateral primary osteoarthritis of knee: Secondary | ICD-10-CM | POA: Diagnosis not present

## 2022-06-25 NOTE — Telephone Encounter (Signed)
Patient called and rescheduled her appt from today to 7/27 due to being sick. Patient stated that she was vomiting and dizzy.

## 2022-06-26 DIAGNOSIS — M170 Bilateral primary osteoarthritis of knee: Secondary | ICD-10-CM | POA: Diagnosis not present

## 2022-06-27 DIAGNOSIS — M170 Bilateral primary osteoarthritis of knee: Secondary | ICD-10-CM | POA: Diagnosis not present

## 2022-06-28 ENCOUNTER — Ambulatory Visit: Payer: Medicaid Other | Admitting: Podiatry

## 2022-06-28 DIAGNOSIS — M170 Bilateral primary osteoarthritis of knee: Secondary | ICD-10-CM | POA: Diagnosis not present

## 2022-06-29 DIAGNOSIS — M170 Bilateral primary osteoarthritis of knee: Secondary | ICD-10-CM | POA: Diagnosis not present

## 2022-06-30 DIAGNOSIS — M170 Bilateral primary osteoarthritis of knee: Secondary | ICD-10-CM | POA: Diagnosis not present

## 2022-07-01 DIAGNOSIS — M170 Bilateral primary osteoarthritis of knee: Secondary | ICD-10-CM | POA: Diagnosis not present

## 2022-07-02 ENCOUNTER — Telehealth: Payer: Self-pay

## 2022-07-02 DIAGNOSIS — M170 Bilateral primary osteoarthritis of knee: Secondary | ICD-10-CM | POA: Diagnosis not present

## 2022-07-02 NOTE — Telephone Encounter (Signed)
Received call from patient regarding her provera. Patient states she just picked up the prescription from the pharmacy and confirmed how to take the medication. She is to take '20mg'$  daily.  Confirmed follow up appointments with cardiology on 7/19 and Dr. Berline Lopes on 7/27. Patient aware of appointments, reviewed importance of appointments. Patient verbalizes understanding, instructed to call with any needs.

## 2022-07-03 DIAGNOSIS — M170 Bilateral primary osteoarthritis of knee: Secondary | ICD-10-CM | POA: Diagnosis not present

## 2022-07-04 ENCOUNTER — Inpatient Hospital Stay (HOSPITAL_COMMUNITY)
Admission: EM | Admit: 2022-07-04 | Discharge: 2022-07-09 | DRG: 291 | Disposition: A | Discharge status: Home / Self Care | Payer: Medicaid Other | Attending: Family Medicine | Admitting: Family Medicine

## 2022-07-04 ENCOUNTER — Other Ambulatory Visit: Payer: Self-pay

## 2022-07-04 ENCOUNTER — Emergency Department (HOSPITAL_COMMUNITY): Payer: Medicaid Other

## 2022-07-04 ENCOUNTER — Encounter (HOSPITAL_COMMUNITY): Payer: Self-pay | Admitting: *Deleted

## 2022-07-04 DIAGNOSIS — R748 Abnormal levels of other serum enzymes: Secondary | ICD-10-CM | POA: Diagnosis present

## 2022-07-04 DIAGNOSIS — Z7982 Long term (current) use of aspirin: Secondary | ICD-10-CM

## 2022-07-04 DIAGNOSIS — R0602 Shortness of breath: Secondary | ICD-10-CM | POA: Diagnosis not present

## 2022-07-04 DIAGNOSIS — H9313 Tinnitus, bilateral: Secondary | ICD-10-CM | POA: Diagnosis not present

## 2022-07-04 DIAGNOSIS — I10 Essential (primary) hypertension: Secondary | ICD-10-CM | POA: Diagnosis not present

## 2022-07-04 DIAGNOSIS — E8809 Other disorders of plasma-protein metabolism, not elsewhere classified: Secondary | ICD-10-CM | POA: Diagnosis not present

## 2022-07-04 DIAGNOSIS — N8502 Endometrial intraepithelial neoplasia [EIN]: Secondary | ICD-10-CM | POA: Diagnosis present

## 2022-07-04 DIAGNOSIS — G4733 Obstructive sleep apnea (adult) (pediatric): Secondary | ICD-10-CM | POA: Diagnosis present

## 2022-07-04 DIAGNOSIS — I509 Heart failure, unspecified: Secondary | ICD-10-CM | POA: Diagnosis not present

## 2022-07-04 DIAGNOSIS — F1410 Cocaine abuse, uncomplicated: Secondary | ICD-10-CM | POA: Diagnosis present

## 2022-07-04 DIAGNOSIS — Z791 Long term (current) use of non-steroidal anti-inflammatories (NSAID): Secondary | ICD-10-CM

## 2022-07-04 DIAGNOSIS — M109 Gout, unspecified: Secondary | ICD-10-CM | POA: Diagnosis present

## 2022-07-04 DIAGNOSIS — R7401 Elevation of levels of liver transaminase levels: Secondary | ICD-10-CM | POA: Diagnosis present

## 2022-07-04 DIAGNOSIS — I252 Old myocardial infarction: Secondary | ICD-10-CM

## 2022-07-04 DIAGNOSIS — Z96651 Presence of right artificial knee joint: Secondary | ICD-10-CM | POA: Diagnosis present

## 2022-07-04 DIAGNOSIS — E876 Hypokalemia: Secondary | ICD-10-CM | POA: Diagnosis not present

## 2022-07-04 DIAGNOSIS — Z720 Tobacco use: Secondary | ICD-10-CM | POA: Diagnosis present

## 2022-07-04 DIAGNOSIS — Z923 Personal history of irradiation: Secondary | ICD-10-CM

## 2022-07-04 DIAGNOSIS — D751 Secondary polycythemia: Secondary | ICD-10-CM | POA: Diagnosis present

## 2022-07-04 DIAGNOSIS — M170 Bilateral primary osteoarthritis of knee: Secondary | ICD-10-CM | POA: Diagnosis not present

## 2022-07-04 DIAGNOSIS — F17210 Nicotine dependence, cigarettes, uncomplicated: Secondary | ICD-10-CM | POA: Diagnosis present

## 2022-07-04 DIAGNOSIS — Z9221 Personal history of antineoplastic chemotherapy: Secondary | ICD-10-CM

## 2022-07-04 DIAGNOSIS — J9601 Acute respiratory failure with hypoxia: Secondary | ICD-10-CM | POA: Diagnosis present

## 2022-07-04 DIAGNOSIS — R519 Headache, unspecified: Secondary | ICD-10-CM | POA: Diagnosis not present

## 2022-07-04 DIAGNOSIS — R778 Other specified abnormalities of plasma proteins: Secondary | ICD-10-CM | POA: Diagnosis present

## 2022-07-04 DIAGNOSIS — I5033 Acute on chronic diastolic (congestive) heart failure: Secondary | ICD-10-CM | POA: Diagnosis present

## 2022-07-04 DIAGNOSIS — I272 Pulmonary hypertension, unspecified: Secondary | ICD-10-CM | POA: Diagnosis present

## 2022-07-04 DIAGNOSIS — R252 Cramp and spasm: Secondary | ICD-10-CM | POA: Diagnosis not present

## 2022-07-04 DIAGNOSIS — F1120 Opioid dependence, uncomplicated: Secondary | ICD-10-CM | POA: Diagnosis present

## 2022-07-04 DIAGNOSIS — N179 Acute kidney failure, unspecified: Secondary | ICD-10-CM | POA: Diagnosis present

## 2022-07-04 DIAGNOSIS — I972 Postmastectomy lymphedema syndrome: Secondary | ICD-10-CM | POA: Diagnosis present

## 2022-07-04 DIAGNOSIS — E785 Hyperlipidemia, unspecified: Secondary | ICD-10-CM | POA: Diagnosis present

## 2022-07-04 DIAGNOSIS — I110 Hypertensive heart disease with heart failure: Principal | ICD-10-CM | POA: Diagnosis present

## 2022-07-04 DIAGNOSIS — Z9013 Acquired absence of bilateral breasts and nipples: Secondary | ICD-10-CM

## 2022-07-04 DIAGNOSIS — Z79890 Hormone replacement therapy (postmenopausal): Secondary | ICD-10-CM

## 2022-07-04 DIAGNOSIS — R0689 Other abnormalities of breathing: Secondary | ICD-10-CM | POA: Diagnosis not present

## 2022-07-04 DIAGNOSIS — Z6841 Body Mass Index (BMI) 40.0 and over, adult: Secondary | ICD-10-CM

## 2022-07-04 DIAGNOSIS — Z8249 Family history of ischemic heart disease and other diseases of the circulatory system: Secondary | ICD-10-CM

## 2022-07-04 DIAGNOSIS — D75839 Thrombocytosis, unspecified: Secondary | ICD-10-CM | POA: Diagnosis present

## 2022-07-04 DIAGNOSIS — E669 Obesity, unspecified: Secondary | ICD-10-CM | POA: Diagnosis present

## 2022-07-04 DIAGNOSIS — Z1152 Encounter for screening for COVID-19: Secondary | ICD-10-CM

## 2022-07-04 DIAGNOSIS — I2510 Atherosclerotic heart disease of native coronary artery without angina pectoris: Secondary | ICD-10-CM | POA: Diagnosis present

## 2022-07-04 DIAGNOSIS — Z955 Presence of coronary angioplasty implant and graft: Secondary | ICD-10-CM

## 2022-07-04 DIAGNOSIS — Z79899 Other long term (current) drug therapy: Secondary | ICD-10-CM

## 2022-07-04 DIAGNOSIS — I700 Atherosclerosis of aorta: Secondary | ICD-10-CM | POA: Diagnosis present

## 2022-07-04 DIAGNOSIS — R0902 Hypoxemia: Secondary | ICD-10-CM | POA: Diagnosis not present

## 2022-07-04 DIAGNOSIS — G894 Chronic pain syndrome: Secondary | ICD-10-CM | POA: Diagnosis present

## 2022-07-04 DIAGNOSIS — Z853 Personal history of malignant neoplasm of breast: Secondary | ICD-10-CM

## 2022-07-04 DIAGNOSIS — R069 Unspecified abnormalities of breathing: Secondary | ICD-10-CM | POA: Diagnosis not present

## 2022-07-04 LAB — I-STAT VENOUS BLOOD GAS, ED
Acid-base deficit: 3 mmol/L — ABNORMAL HIGH (ref 0.0–2.0)
Bicarbonate: 21.3 mmol/L (ref 20.0–28.0)
Calcium, Ion: 0.99 mmol/L — ABNORMAL LOW (ref 1.15–1.40)
HCT: 53 % — ABNORMAL HIGH (ref 36.0–46.0)
Hemoglobin: 18 g/dL — ABNORMAL HIGH (ref 12.0–15.0)
O2 Saturation: 99 %
Potassium: 4.7 mmol/L (ref 3.5–5.1)
Sodium: 139 mmol/L (ref 135–145)
TCO2: 22 mmol/L (ref 22–32)
pCO2, Ven: 36.8 mmHg — ABNORMAL LOW (ref 44–60)
pH, Ven: 7.371 (ref 7.25–7.43)
pO2, Ven: 158 mmHg — ABNORMAL HIGH (ref 32–45)

## 2022-07-04 LAB — I-STAT CHEM 8, ED
BUN: 12 mg/dL (ref 6–20)
Calcium, Ion: 0.99 mmol/L — ABNORMAL LOW (ref 1.15–1.40)
Chloride: 110 mmol/L (ref 98–111)
Creatinine, Ser: 0.7 mg/dL (ref 0.44–1.00)
Glucose, Bld: 205 mg/dL — ABNORMAL HIGH (ref 70–99)
HCT: 54 % — ABNORMAL HIGH (ref 36.0–46.0)
Hemoglobin: 18.4 g/dL — ABNORMAL HIGH (ref 12.0–15.0)
Potassium: 5.1 mmol/L (ref 3.5–5.1)
Sodium: 138 mmol/L (ref 135–145)
TCO2: 21 mmol/L — ABNORMAL LOW (ref 22–32)

## 2022-07-04 LAB — CBC WITH DIFFERENTIAL/PLATELET
Abs Immature Granulocytes: 0.09 10*3/uL — ABNORMAL HIGH (ref 0.00–0.07)
Basophils Absolute: 0.1 10*3/uL (ref 0.0–0.1)
Basophils Relative: 1 %
Eosinophils Absolute: 0.3 10*3/uL (ref 0.0–0.5)
Eosinophils Relative: 3 %
HCT: 54.4 % — ABNORMAL HIGH (ref 36.0–46.0)
Hemoglobin: 16.8 g/dL — ABNORMAL HIGH (ref 12.0–15.0)
Immature Granulocytes: 1 %
Lymphocytes Relative: 41 %
Lymphs Abs: 4.2 10*3/uL — ABNORMAL HIGH (ref 0.7–4.0)
MCH: 28 pg (ref 26.0–34.0)
MCHC: 30.9 g/dL (ref 30.0–36.0)
MCV: 90.5 fL (ref 80.0–100.0)
Monocytes Absolute: 0.8 10*3/uL (ref 0.1–1.0)
Monocytes Relative: 8 %
Neutro Abs: 5 10*3/uL (ref 1.7–7.7)
Neutrophils Relative %: 46 %
Platelets: 541 10*3/uL — ABNORMAL HIGH (ref 150–400)
RBC: 6.01 MIL/uL — ABNORMAL HIGH (ref 3.87–5.11)
RDW: 16.2 % — ABNORMAL HIGH (ref 11.5–15.5)
WBC: 10.4 10*3/uL (ref 4.0–10.5)
nRBC: 0.2 % (ref 0.0–0.2)

## 2022-07-04 LAB — PROTIME-INR
INR: 1.1 (ref 0.8–1.2)
Prothrombin Time: 14 seconds (ref 11.4–15.2)

## 2022-07-04 LAB — RESP PANEL BY RT-PCR (FLU A&B, COVID) ARPGX2
Influenza A by PCR: NEGATIVE
Influenza B by PCR: NEGATIVE
SARS Coronavirus 2 by RT PCR: NEGATIVE

## 2022-07-04 LAB — BRAIN NATRIURETIC PEPTIDE: B Natriuretic Peptide: 915.1 pg/mL — ABNORMAL HIGH (ref 0.0–100.0)

## 2022-07-04 LAB — TROPONIN I (HIGH SENSITIVITY): Troponin I (High Sensitivity): 19 ng/L — ABNORMAL HIGH (ref <18)

## 2022-07-04 MED ORDER — IPRATROPIUM-ALBUTEROL 0.5-2.5 (3) MG/3ML IN SOLN
3.0000 mL | Freq: Once | RESPIRATORY_TRACT | Status: AC
  Administered 2022-07-04: 3 mL via RESPIRATORY_TRACT
  Filled 2022-07-04: qty 3

## 2022-07-04 MED ORDER — FUROSEMIDE 10 MG/ML IJ SOLN
40.0000 mg | Freq: Once | INTRAMUSCULAR | Status: AC
  Administered 2022-07-04: 40 mg via INTRAVENOUS
  Filled 2022-07-04: qty 4

## 2022-07-04 MED ORDER — LORAZEPAM 2 MG/ML IJ SOLN
0.5000 mg | Freq: Once | INTRAMUSCULAR | Status: AC
  Administered 2022-07-04: 0.5 mg via INTRAVENOUS
  Filled 2022-07-04: qty 1

## 2022-07-04 MED ORDER — ENOXAPARIN SODIUM 40 MG/0.4ML IJ SOSY
40.0000 mg | PREFILLED_SYRINGE | INTRAMUSCULAR | Status: DC
  Administered 2022-07-05 – 2022-07-09 (×5): 40 mg via SUBCUTANEOUS
  Filled 2022-07-04 (×5): qty 0.4

## 2022-07-04 MED ORDER — METHYLPREDNISOLONE SODIUM SUCC 125 MG IJ SOLR
125.0000 mg | Freq: Once | INTRAMUSCULAR | Status: AC
  Administered 2022-07-04: 125 mg via INTRAVENOUS
  Filled 2022-07-04: qty 2

## 2022-07-04 NOTE — ED Triage Notes (Addendum)
Pt arrived from home for respiratory distress. EMS reported near silent lung sounds, initial sats 80%. Pt reports sob yesterday, sudden onset of resp distress today. EMS unable to obtain IV. 0.3 epi and duoneb given pta

## 2022-07-04 NOTE — Assessment & Plan Note (Signed)
Suspected uterine cancer. Planning for hysterectomy. Is on provera outpatient.  - Hold provera until med rec, restart when appropriate - Monitor CBC

## 2022-07-04 NOTE — Assessment & Plan Note (Addendum)
History of cocaine use which could contribute to symptoms. - UDS - TOC consult

## 2022-07-04 NOTE — Assessment & Plan Note (Signed)
Per prior notes, is on for chronic pain syndrome.  - Methadone '15mg'$  q8h PRN

## 2022-07-04 NOTE — Assessment & Plan Note (Addendum)
Respiratory status improved to RA with diuresis. - Strict I/O to monitor UOP - NM stress test positive - Consulted cardiology, recs appreciated - On RA, spO2 >95% - Continuous cardiac monitoring - Strict Is&Os - Daily weights - Return to home 15mq K TID - Coreg - Jardiance '10mg'$ 

## 2022-07-04 NOTE — H&P (Cosign Needed Addendum)
Hospital Admission History and Physical Service Pager: [phone]  Patient name: [name] Medical record number: [id number] Date of Birth: 1961-05-29 Age: 61 y.o. Gender: female  Primary Care Provider: Program, [facility] Residency Consultants: None Code Status: Full  Preferred Emergency Contact:  Name Relation Home Work Mobile   [name] Significant other   [phone]   Chief Complaint: SOB  Assessment and Plan: [name] is a 61 y.o. female presenting with SOB. Differential for this patient's presentation of this includes most likely CHF exacerbation (fluid overload on CXR and exam, BNP 915). Other considerations include pneumonia (CXR findings of edema, though afebrile, no leukocytosis), PE (given CAD and cancer history, though improvement with lasix and BiPAP), and MI (given history of STEMI, though negative ECG).  * Acute hypoxemic respiratory failure (HCC) Stable on BiPAP, satting 100%. Likely 2/2 CHF exacerbation. Improved with steroids, diuretics, ativan, duonebs. - Admit to FMTS attending Dr. Owens [name]. - Strict I/O to monitor UOP s/p 1x lasix, dose accordingly - ECHO to assess heart failure progression, last EF 60-65% with Grade I diastolic dysfunction 04/4655 - Continue BiPAP, wean to RA as tolerated - Vitals per floor protocol - Continuous cardiac monitoring - Continuous O2 monitoring and supplemental O2 - Strict Is&Os - Daily weights - NPO while on BiPAP - CMP in AM to assess Cr - Trend troponin if continued greater than 18 - Restart metoprolol if appropriate after med rec  Cocaine abuse ([name]) History of cocaine use which could contribute to symptoms. - UDS - TOC consult  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[name]) Per prior notes, is on for chronic pain syndrome.  - Awaiting med rec given PRN dosing listed  Complex atypical endometrial hyperplasia Suspected uterine cancer. Planning for hysterectomy. Is on provera outpatient.  - Hold  provera until med rec, restart when appropriate - Monitor CBC  Tobacco abuse Has history of tobacco use. Has been trying to wean down; currently takes 4 days to smoke one pack. - F/u PCP to encourage continued cessation efforts - Nicotine patch to be ordered upon patient request   Chronic conditions: HTN: restart amlodipine, imdur, nitroglycerin as appropriate HLD: restart statin after med rec CAD: on daily ASA, metoprolol, imdur  FEN/GI: NPO while on BiPAP VTE Prophylaxis: Lovenox  Disposition: Progressive  History of Present Illness:  [name] is a 61 y.o. female presenting with SOB.  [name] states her symptoms began yesterday when walking throughout the house. She became short of breath that continued to worsen over yesterday and today. She has also noted increased leg swelling though no redness or pain. This has never happened before. She has been taking her metoprolol as prescribed. She denies any fevers, chills, or sick contacts.  With EMS, she received 0.3 mg epi and duonebs with CPAP given initial SpO2 80% on RA. On arrival to ED, she was diaphoretic and in distress; BiPAP initiated with IV lasix 40 mg once, duonebs, Ativan 0.5 mg IV, and solumedrol 125 mg IV with remarkable improvement in status.  Review Of Systems: Per HPI  Pertinent Past Medical History: Anemia, CAD, breast cancer, chronic pain, dyslipidemia, gout, HTN, MI, umbilical hernia Remainder reviewed in history tab.   Pertinent Past Surgical History: Breast lumpectomy, mastectomy, left heart cath  Remainder reviewed in history tab.   Pertinent Social History: Tobacco use: 0.5 PPD daily Alcohol use: No Other Substance use: Cocaine, THC in past  Pertinent Family History: Mother: heart failure, MI Father: heart failure, MI Cousins (2): breast  cancer Remainder reviewed in history tab.   Important Outpatient Medications: Amlodipine 5 mg daily ASA 81 mg daily Imdur 60 mg QHS Methadone 5 mg TID  prn Nitroglycerin 0.4 mg Q5 mins prn Remainder reviewed in medication history.   Objective: BP 119/82   Pulse 89   Temp 98.7 F (37.1 C)   Resp 17   LMP 08/07/2011   SpO2 100%  Exam: General: Lying in bed, conversant, in NAD Eyes: Anicteric, some lacrimation Cardiovascular: RRR, no m/r/g, trace pitting edema of bilateral LE Respiratory: End-expiratory wheezes heard throughout, good air movement with BiPAP on Gastrointestinal: Soft, distended, non-tender MSK: Moves all extremities equally Derm: Androgenergic facial hair pattern Neuro: Alert and oriented Psych: Euthymic mood and affect   Labs:  CBC BMET  Recent Labs  Lab 07/04/22 2038 07/04/22 2047  WBC 10.4  --   HGB 16.8* 18.0*  18.4*  HCT 54.4* 53.0*  54.0*  PLT 541*  --    Recent Labs  Lab 07/04/22 2047  NA 139  138  K 4.7  5.1  CL 110  BUN 12  CREATININE 0.70  GLUCOSE 205*     VBG: 36.8 pCO2, 158 pO2 Troponin 19 BNP 915  EKG: Sinus tachycardia, rightward axis, no ST changes  Imaging Studies Performed:  CXR IMPRESSION: Pulmonary opacities are favored represent pulmonary edema. The opacity is a little more focal in the right base raising the possibility of infiltrate/infection instead. Recommend clinical correlation. My impression: agree with pulmonary edema given clinical picture  [name]. MD 07/04/2022, 11:06 PM PGY-1, [facility] Intern pager: [phone], text pages welcome Secure chat group [facility] Upper-Level Resident Addendum   I have independently interviewed and examined the patient. I have discussed the above with the original author and agree with their documentation. My edits for correction/addition/clarification are in within the document. Please see also any attending notes.   Rise Patience, DO  PGY-2, [facility] 07/04/2022 11:16 PM  FPTS Service pager: [phone] (text pages welcome  through [facility])

## 2022-07-04 NOTE — Assessment & Plan Note (Addendum)
Has history of tobacco use. Has been trying to wean down; currently takes 4 days to smoke one pack. - F/u PCP to encourage continued cessation efforts - Nicotine patch to be ordered upon patient request

## 2022-07-04 NOTE — ED Provider Notes (Signed)
[facility] EMERGENCY DEPARTMENT Provider Note   CSN: [id number] Arrival date & time: 07/04/22  2027     History  Chief Complaint  Patient presents with   Respiratory Distress    [name] is a 61 y.o. female.  61 year old female with prior medical history as detailed below presents for evaluation.  Patient with severe respiratory distress that apparently began over several hours prior to arrival.  Patient was placed on CPAP with EMS.  Patient was given 0.3 mg of epi subcu.  DuoNeb started during EMS transport.  EMS reports that his sats initially were in the 80% on room air.  Patient denies chest pain.  Patient is diaphoretic and clearly in distress.  BiPAP initiated upon arrival to the ED.  Patient with history of hypertension, CAD, chronic pain.  The history is provided by the patient and medical records.       Home Medications Prior to Admission medications   Medication Sig Start Date End Date Taking? Authorizing Provider  allopurinol (ZYLOPRIM) 300 MG tablet Take 1 tablet (300 mg total) by mouth daily. 06/09/22   [name], MD  amLODipine (NORVASC) 5 MG tablet Take 1 tablet (5 mg total) by mouth daily. 06/09/22   [name], MD  ascorbic acid (VITAMIN C) 500 MG tablet Take 500 mg by mouth daily.    [provider]  aspirin EC 81 MG tablet Take 81 mg by mouth daily.    [provider]  atorvastatin (LIPITOR) 40 MG tablet TAKE 1 TABLET BY MOUTH DAILY AT 6 PM. 05/03/22   [name], MD  camphor-menthol [facility]) lotion Apply topically as needed for itching. 07/17/19   Eugenie Filler, MD  cholecalciferol (VITAMIN D3) 25 MCG (1000 UNIT) tablet Take 1,000 Units by mouth daily.    [provider]  ELDERBERRY PO Take 1,000 mg by mouth daily.    [provider]  Ginkgo Biloba 120 MG TABS Take 120 mg by mouth daily.    [provider]  isosorbide mononitrate (IMDUR) 60 MG 24 hr tablet Take 1 tablet (60  mg total) by mouth at bedtime. 06/09/22   [name], MD  medroxyPROGESTERone (PROVERA) 10 MG tablet Take 2 tablets (20 mg total) by mouth daily. 06/07/22   Cross, Lenna Sciara D, NP  meloxicam (MOBIC) 15 MG tablet Take 1 tablet (15 mg total) by mouth daily. 10/20/21   Gladys Damme, MD  methadone (DOLOPHINE) 5 MG tablet Take 3 tablets by mouth every 8  hours as needed  **06/17/22 06/17/22 12/14/22  Gardenia Phlegm, NP  metoprolol succinate (TOPROL-XL) 50 MG 24 hr tablet Take 2 tablets (100 mg total) by mouth daily. Take with or immediately following a meal. 06/09/22   [name], MD  nitroGLYCERIN (NITROSTAT) 0.4 MG SL tablet Place 1 tablet (0.4 mg total) under the tongue every 5 (five) minutes as needed for chest pain. 06/09/22   [name], MD  potassium chloride SA (KLOR-CON M20) 20 MEQ tablet Take 1 tablet (20 mEq total) by mouth 3 (three) times daily. 06/09/22   [name], MD  famotidine (PEPCID) 20 MG tablet Take 20 mg by mouth 2 (two) times daily.    03/10/12  [provider]      Allergies    Patient has no known allergies.    Review of Systems   Review of Systems  All other systems reviewed and are negative.   Physical Exam Updated Vital Signs Pulse (!) 105   Temp  98.7 F (37.1 C)   Resp (!) 48   LMP 08/07/2011   SpO2 100%  Physical Exam Vitals and nursing note reviewed.  Constitutional:      General: She is not in acute distress.    Appearance: She is well-developed.     Comments: Obvious respiratory distress, diaphoretic  HENT:     Head: Normocephalic and atraumatic.  Eyes:     Conjunctiva/sclera: Conjunctivae normal.     Pupils: Pupils are equal, round, and reactive to light.  Cardiovascular:     Rate and Rhythm: Regular rhythm. Tachycardia present.     Heart sounds: Normal heart sounds.  Pulmonary:     Effort: Respiratory distress present.     Comments: Creased breath sounds bilaterally with trace expiratory wheezes Abdominal:      General: There is no distension.     Palpations: Abdomen is soft.     Tenderness: There is no abdominal tenderness.  Musculoskeletal:        General: No deformity. Normal range of motion.     Cervical back: Normal range of motion and neck supple.  Skin:    General: Skin is warm and dry.  Neurological:     General: No focal deficit present.     Mental Status: She is alert and oriented to person, place, and time.     ED Results / Procedures / Treatments   Labs (all labs ordered are listed, but only abnormal results are displayed) Labs Reviewed  CBC WITH DIFFERENTIAL/PLATELET - Abnormal; Notable for the following components:      Result Value   RBC 6.01 (*)    Hemoglobin 16.8 (*)    HCT 54.4 (*)    RDW 16.2 (*)    Platelets 541 (*)    Lymphs Abs 4.2 (*)    Abs Immature Granulocytes 0.09 (*)    All other components within normal limits  I-STAT CHEM 8, ED - Abnormal; Notable for the following components:   Glucose, Bld 205 (*)    Calcium, Ion 0.99 (*)    TCO2 21 (*)    Hemoglobin 18.4 (*)    HCT 54.0 (*)    All other components within normal limits  I-STAT VENOUS BLOOD GAS, ED - Abnormal; Notable for the following components:   pCO2, Ven 36.8 (*)    pO2, Ven 158 (*)    Acid-base deficit 3.0 (*)    Calcium, Ion 0.99 (*)    HCT 53.0 (*)    Hemoglobin 18.0 (*)    All other components within normal limits  RESP PANEL BY RT-PCR (FLU A&B, COVID) ARPGX2  BRAIN NATRIURETIC PEPTIDE  PROTIME-INR  COMPREHENSIVE METABOLIC PANEL  TROPONIN I (HIGH SENSITIVITY)    EKG EKG Interpretation  Date/Time:  Sunday July 04 2022 20:35:08 EDT Ventricular Rate:  99 PR Interval:  162 QRS Duration: 113 QT Interval:  377 QTC Calculation: 484 R Axis:   90 Text Interpretation: Sinus tachycardia Ventricular premature complex Probable anterior infarct, old Confirmed by Dene Gentry [phone]) on 07/04/2022 8:39:46 PM  Radiology DG Chest Port 1 View  Result Date: 07/04/2022 CLINICAL DATA:   Shortness of breath EXAM: PORTABLE CHEST 1 VIEW COMPARISON:  March 29, 2022 FINDINGS: Stable cardiomegaly. The hila and mediastinum are unchanged. No pneumothorax. Bilateral pulmonary opacities, right greater than left. The opacity is a little more focal in the medial right base. No other acute abnormalities. IMPRESSION: Pulmonary opacities are favored represent pulmonary edema. The opacity is a little more focal in the right  base raising the possibility of infiltrate/infection instead. Recommend clinical correlation. Electronically Signed   By: Dorise Bullion III M.D.   On: 07/04/2022 21:03    Procedures Procedures    Medications Ordered in ED Medications  furosemide (LASIX) injection 40 mg (has no administration in time range)  ipratropium-albuterol (DUONEB) 0.5-2.5 (3) MG/3ML nebulizer solution 3 mL (3 mLs Nebulization Given 07/04/22 2038)  methylPREDNISolone sodium succinate (SOLU-MEDROL) 125 mg/2 mL injection 125 mg (125 mg Intravenous Given 07/04/22 2037)  LORazepam (ATIVAN) injection 0.5 mg (0.5 mg Intravenous Given 07/04/22 2038)    ED Course/ Medical Decision Making/ A&P                           Medical Decision Making Amount and/or Complexity of Data Reviewed Labs: ordered. Radiology: ordered.  Risk Prescription drug management. Decision regarding hospitalization.    Medical Screen Complete  This patient presented to the ED with complaint of shortness of breath.  This complaint involves an extensive number of treatment options. The initial differential diagnosis includes, but is not limited to, CHF exacerbation, COPD exacerbation, other intrapulmonary pathology  This presentation is: Acute, Self-Limited, Previously Undiagnosed, Uncertain Prognosis, Complicated, Systemic Symptoms, and Threat to Life/Bodily Function Presents with acute onset shortness of breath  Patient with significant respiratory stress and hypoxia on initial evaluation with EMS.  On BiPAP patient is  significantly improved.  Exam and work-up is suggestive of CHF exacerbation.  Patient will require admission.  Family medicine teaching team is aware of case and will evaluate for admission.   Additional history obtained:  External records from outside sources obtained and reviewed including prior ED visits and prior Inpatient records.    Lab Tests:  I ordered and personally interpreted labs.  The pertinent results include: CBC, BMP, troponin, CMP, i-STAT Chem-8, i-STAT VBG   Imaging Studies ordered:  I ordered imaging studies including chest x-ray I independently visualized and interpreted obtained imaging which showed pulmonary edema I agree with the radiologist interpretation.   Cardiac Monitoring:  The patient was maintained on a cardiac monitor.  I personally viewed and interpreted the cardiac monitor which showed an underlying rhythm of: Sinus tach   Medicines ordered:  I ordered medication including DuoNeb, Solu-Medrol, Ativan, Lasix for shortness of breath Reevaluation of the patient after these medicines showed that the patient: improved    Problem List / ED Course:  Shortness of breath, pulmonary edema   Reevaluation:  After the interventions noted above, I reevaluated the patient and found that they have: improved Disposition:  After consideration of the diagnostic results and the patients response to treatment, I feel that the patent would benefit from admission.   CRITICAL CARE Performed by: Valarie Merino   Total critical care time: 30 minutes  Critical care time was exclusive of separately billable procedures and treating other patients.  Critical care was necessary to treat or prevent imminent or life-threatening deterioration.  Critical care was time spent personally by me on the following activities: development of treatment plan with patient and/or surrogate as well as nursing, discussions with consultants, evaluation of patient's  response to treatment, examination of patient, obtaining history from patient or surrogate, ordering and performing treatments and interventions, ordering and review of laboratory studies, ordering and review of radiographic studies, pulse oximetry and re-evaluation of patient's condition.          Final Clinical Impression(s) / ED Diagnoses Final diagnoses:  Shortness of breath  Rx / DC Orders ED Discharge Orders     None         Valarie Merino, MD 07/04/22 [phone]

## 2022-07-04 NOTE — ED Notes (Signed)
Pt resting calmly.  Respiratory distress improved.

## 2022-07-05 ENCOUNTER — Ambulatory Visit (HOSPITAL_COMMUNITY): Payer: Medicaid Other

## 2022-07-05 ENCOUNTER — Telehealth: Payer: Self-pay | Admitting: Interventional Cardiology

## 2022-07-05 ENCOUNTER — Encounter (HOSPITAL_COMMUNITY): Payer: Self-pay | Admitting: Family Medicine

## 2022-07-05 DIAGNOSIS — F1120 Opioid dependence, uncomplicated: Secondary | ICD-10-CM | POA: Diagnosis present

## 2022-07-05 DIAGNOSIS — R778 Other specified abnormalities of plasma proteins: Secondary | ICD-10-CM | POA: Diagnosis present

## 2022-07-05 DIAGNOSIS — I252 Old myocardial infarction: Secondary | ICD-10-CM | POA: Diagnosis not present

## 2022-07-05 DIAGNOSIS — E785 Hyperlipidemia, unspecified: Secondary | ICD-10-CM | POA: Diagnosis present

## 2022-07-05 DIAGNOSIS — I517 Cardiomegaly: Secondary | ICD-10-CM | POA: Diagnosis not present

## 2022-07-05 DIAGNOSIS — I248 Other forms of acute ischemic heart disease: Secondary | ICD-10-CM | POA: Diagnosis not present

## 2022-07-05 DIAGNOSIS — G894 Chronic pain syndrome: Secondary | ICD-10-CM | POA: Diagnosis present

## 2022-07-05 DIAGNOSIS — Z6841 Body Mass Index (BMI) 40.0 and over, adult: Secondary | ICD-10-CM | POA: Diagnosis not present

## 2022-07-05 DIAGNOSIS — I5031 Acute diastolic (congestive) heart failure: Secondary | ICD-10-CM | POA: Diagnosis not present

## 2022-07-05 DIAGNOSIS — Z720 Tobacco use: Secondary | ICD-10-CM | POA: Diagnosis not present

## 2022-07-05 DIAGNOSIS — I5033 Acute on chronic diastolic (congestive) heart failure: Secondary | ICD-10-CM | POA: Diagnosis not present

## 2022-07-05 DIAGNOSIS — I2510 Atherosclerotic heart disease of native coronary artery without angina pectoris: Secondary | ICD-10-CM | POA: Diagnosis not present

## 2022-07-05 DIAGNOSIS — I509 Heart failure, unspecified: Secondary | ICD-10-CM

## 2022-07-05 DIAGNOSIS — F1410 Cocaine abuse, uncomplicated: Secondary | ICD-10-CM | POA: Diagnosis present

## 2022-07-05 DIAGNOSIS — M170 Bilateral primary osteoarthritis of knee: Secondary | ICD-10-CM | POA: Diagnosis not present

## 2022-07-05 DIAGNOSIS — R0602 Shortness of breath: Secondary | ICD-10-CM | POA: Diagnosis not present

## 2022-07-05 DIAGNOSIS — E8809 Other disorders of plasma-protein metabolism, not elsewhere classified: Secondary | ICD-10-CM | POA: Diagnosis not present

## 2022-07-05 DIAGNOSIS — D75839 Thrombocytosis, unspecified: Secondary | ICD-10-CM | POA: Diagnosis present

## 2022-07-05 DIAGNOSIS — J9601 Acute respiratory failure with hypoxia: Secondary | ICD-10-CM | POA: Diagnosis not present

## 2022-07-05 DIAGNOSIS — D751 Secondary polycythemia: Secondary | ICD-10-CM | POA: Diagnosis not present

## 2022-07-05 DIAGNOSIS — E876 Hypokalemia: Secondary | ICD-10-CM | POA: Diagnosis not present

## 2022-07-05 DIAGNOSIS — I5021 Acute systolic (congestive) heart failure: Secondary | ICD-10-CM | POA: Diagnosis not present

## 2022-07-05 DIAGNOSIS — I272 Pulmonary hypertension, unspecified: Secondary | ICD-10-CM | POA: Diagnosis present

## 2022-07-05 DIAGNOSIS — Z1152 Encounter for screening for COVID-19: Secondary | ICD-10-CM | POA: Diagnosis not present

## 2022-07-05 DIAGNOSIS — G4733 Obstructive sleep apnea (adult) (pediatric): Secondary | ICD-10-CM | POA: Diagnosis present

## 2022-07-05 DIAGNOSIS — R079 Chest pain, unspecified: Secondary | ICD-10-CM | POA: Diagnosis not present

## 2022-07-05 DIAGNOSIS — R748 Abnormal levels of other serum enzymes: Secondary | ICD-10-CM | POA: Diagnosis not present

## 2022-07-05 DIAGNOSIS — Z955 Presence of coronary angioplasty implant and graft: Secondary | ICD-10-CM | POA: Diagnosis not present

## 2022-07-05 DIAGNOSIS — N8502 Endometrial intraepithelial neoplasia [EIN]: Secondary | ICD-10-CM | POA: Diagnosis not present

## 2022-07-05 DIAGNOSIS — F17210 Nicotine dependence, cigarettes, uncomplicated: Secondary | ICD-10-CM | POA: Diagnosis present

## 2022-07-05 DIAGNOSIS — I700 Atherosclerosis of aorta: Secondary | ICD-10-CM | POA: Diagnosis present

## 2022-07-05 DIAGNOSIS — N179 Acute kidney failure, unspecified: Secondary | ICD-10-CM | POA: Diagnosis present

## 2022-07-05 DIAGNOSIS — I110 Hypertensive heart disease with heart failure: Secondary | ICD-10-CM | POA: Diagnosis present

## 2022-07-05 HISTORY — DX: Secondary polycythemia: D75.1

## 2022-07-05 LAB — COMPREHENSIVE METABOLIC PANEL
ALT: 72 U/L — ABNORMAL HIGH (ref 0–44)
AST: 64 U/L — ABNORMAL HIGH (ref 15–41)
Albumin: 3.6 g/dL (ref 3.5–5.0)
Alkaline Phosphatase: 124 U/L (ref 38–126)
Anion gap: 12 (ref 5–15)
BUN: 10 mg/dL (ref 6–20)
CO2: 20 mmol/L — ABNORMAL LOW (ref 22–32)
Calcium: 8.7 mg/dL — ABNORMAL LOW (ref 8.9–10.3)
Chloride: 106 mmol/L (ref 98–111)
Creatinine, Ser: 0.91 mg/dL (ref 0.44–1.00)
GFR, Estimated: >60 mL/min (ref >60)
Glucose, Bld: 110 mg/dL — ABNORMAL HIGH (ref 70–99)
Potassium: 3.6 mmol/L (ref 3.5–5.1)
Sodium: 138 mmol/L (ref 135–145)
Total Bilirubin: 0.7 mg/dL (ref 0.3–1.2)
Total Protein: 7.4 g/dL (ref 6.5–8.1)

## 2022-07-05 LAB — ECHOCARDIOGRAM COMPLETE
AR max vel: 2.44 cm2
AV Peak grad: 11 mmHg
Ao pk vel: 1.66 m/s
Area-P 1/2: 4.01 cm2
S' Lateral: 4.1 cm
Single Plane A4C EF: 56.2 %

## 2022-07-05 LAB — BASIC METABOLIC PANEL
Anion gap: 14 (ref 5–15)
BUN: 20 mg/dL (ref 6–20)
CO2: 19 mmol/L — ABNORMAL LOW (ref 22–32)
Calcium: 8.7 mg/dL — ABNORMAL LOW (ref 8.9–10.3)
Chloride: 104 mmol/L (ref 98–111)
Creatinine, Ser: 1.21 mg/dL — ABNORMAL HIGH (ref 0.44–1.00)
GFR, Estimated: 52 mL/min — ABNORMAL LOW (ref >60)
Glucose, Bld: 144 mg/dL — ABNORMAL HIGH (ref 70–99)
Potassium: 3.7 mmol/L (ref 3.5–5.1)
Sodium: 137 mmol/L (ref 135–145)

## 2022-07-05 LAB — TROPONIN I (HIGH SENSITIVITY)
Troponin I (High Sensitivity): 64 ng/L — ABNORMAL HIGH (ref <18)
Troponin I (High Sensitivity): 45 ng/L — ABNORMAL HIGH (ref <18)

## 2022-07-05 LAB — RAPID URINE DRUG SCREEN, HOSP PERFORMED
Amphetamines: NOT DETECTED
Barbiturates: NOT DETECTED
Benzodiazepines: NOT DETECTED
Cocaine: NOT DETECTED
Opiates: NOT DETECTED
Tetrahydrocannabinol: NOT DETECTED

## 2022-07-05 LAB — TECHNOLOGIST SMEAR REVIEW

## 2022-07-05 LAB — HIV ANTIBODY (ROUTINE TESTING W REFLEX): HIV Screen 4th Generation wRfx: NONREACTIVE

## 2022-07-05 LAB — HEPATITIS PANEL, ACUTE
HCV Ab: NONREACTIVE
Hep A IgM: NONREACTIVE
Hep B C IgM: NONREACTIVE
Hepatitis B Surface Ag: NONREACTIVE

## 2022-07-05 LAB — MAGNESIUM: Magnesium: 1.6 mg/dL — ABNORMAL LOW (ref 1.7–2.4)

## 2022-07-05 MED ORDER — ALLOPURINOL 300 MG PO TABS
300.0000 mg | ORAL_TABLET | Freq: Every day | ORAL | Status: DC
  Administered 2022-07-05 – 2022-07-09 (×5): 300 mg via ORAL
  Filled 2022-07-05 (×4): qty 1

## 2022-07-05 MED ORDER — METHADONE HCL 10 MG PO TABS: 15.0000 mg | ORAL_TABLET | Freq: Three times a day (TID) | ORAL | Status: DC | PRN

## 2022-07-05 MED ORDER — FUROSEMIDE 10 MG/ML IJ SOLN
40.0000 mg | Freq: Once | INTRAMUSCULAR | Status: AC
Start: 2022-07-05 — End: 2022-07-05
  Administered 2022-07-05: 40 mg via INTRAVENOUS
  Filled 2022-07-05: qty 4

## 2022-07-05 MED ORDER — AMLODIPINE BESYLATE 5 MG PO TABS
5.0000 mg | ORAL_TABLET | Freq: Every day | ORAL | Status: DC
  Administered 2022-07-05 – 2022-07-06 (×2): 5 mg via ORAL
  Filled 2022-07-05 (×2): qty 1

## 2022-07-05 MED ORDER — ISOSORBIDE MONONITRATE ER 60 MG PO TB24
60.0000 mg | ORAL_TABLET | Freq: Every day | ORAL | Status: DC
  Administered 2022-07-05 – 2022-07-08 (×4): 60 mg via ORAL
  Filled 2022-07-05 (×4): qty 1

## 2022-07-05 MED ORDER — VITAMIN D 25 MCG (1000 UNIT) PO TABS
1000.0000 [IU] | ORAL_TABLET | Freq: Every day | ORAL | Status: DC
  Administered 2022-07-05 – 2022-07-09 (×5): 1000 [IU] via ORAL
  Filled 2022-07-05 (×5): qty 1

## 2022-07-05 MED ORDER — MEDROXYPROGESTERONE ACETATE 10 MG PO TABS
20.0000 mg | ORAL_TABLET | Freq: Every day | ORAL | Status: DC
  Administered 2022-07-05 – 2022-07-09 (×5): 20 mg via ORAL
  Filled 2022-07-05 (×5): qty 2

## 2022-07-05 MED ORDER — CARVEDILOL 25 MG PO TABS
25.0000 mg | ORAL_TABLET | Freq: Two times a day (BID) | ORAL | Status: DC
  Administered 2022-07-05 – 2022-07-09 (×8): 25 mg via ORAL
  Filled 2022-07-05 (×7): qty 1
  Filled 2022-07-05: qty 2

## 2022-07-05 MED ORDER — METOPROLOL SUCCINATE ER 25 MG PO TB24
100.0000 mg | ORAL_TABLET | Freq: Every day | ORAL | Status: DC
  Administered 2022-07-05: 100 mg via ORAL
  Filled 2022-07-05: qty 4

## 2022-07-05 MED ORDER — ASPIRIN 81 MG PO TBEC
81.0000 mg | DELAYED_RELEASE_TABLET | Freq: Every day | ORAL | Status: DC
  Administered 2022-07-05 – 2022-07-09 (×5): 81 mg via ORAL
  Filled 2022-07-05 (×5): qty 1

## 2022-07-05 MED ORDER — MAGNESIUM SULFATE IN D5W 1-5 GM/100ML-% IV SOLN
1.0000 g | Freq: Once | INTRAVENOUS | Status: AC
Start: 2022-07-05 — End: 2022-07-05
  Administered 2022-07-05: 1 g via INTRAVENOUS
  Filled 2022-07-05: qty 100

## 2022-07-05 MED ORDER — FUROSEMIDE 10 MG/ML IJ SOLN
40.0000 mg | Freq: Two times a day (BID) | INTRAMUSCULAR | Status: AC
  Administered 2022-07-05 (×2): 40 mg via INTRAVENOUS
  Filled 2022-07-05 (×2): qty 4

## 2022-07-05 MED ORDER — ATORVASTATIN CALCIUM 40 MG PO TABS
40.0000 mg | ORAL_TABLET | Freq: Every day | ORAL | Status: DC
  Administered 2022-07-05 – 2022-07-09 (×5): 40 mg via ORAL
  Filled 2022-07-05 (×5): qty 1

## 2022-07-05 NOTE — Progress Notes (Signed)
Daily Progress Note Intern Pager: [phone]  Patient name: [name] Medical record number: [id number] Date of birth: 08-24-1961 Age: 61 y.o. Gender: female  Primary Care Provider: Program, [facility] Residency Consultants: None Code Status: Full  Pt Overview and Major Events to Date:  -07/04/22 Admission  Assessment and Plan:  ANNTOINETTE HAEFELE is a 61 y.o. female presenting with SOB likely due to CHF exacerbation. Pertinent PMHx includes anemia, CAD, breast cancer, chronic pain, dyslipidemia, gout, HTN, MI, umbilical hernia.  * Acute hypoxemic respiratory failure (HCC) Stable on BiPAP, satting 100%. Likely 2/2 CHF exacerbation. Improved with steroids, diuretics, ativan, duonebs.  - Admit to FMTS attending Dr. Owens Shark. - Strict I/O to monitor UOP s/p 1x lasix, dose accordingly - ECHO to assess heart failure progression, last EF 60-65% with Grade I diastolic dysfunction 02/864 - Follow-up ECHO, cardiology consult if newfound cardiac function changes - On 4 L nasal cannula, wean to room air as tolerated - Vitals per floor protocol - Continuous cardiac monitoring - Continuous O2 monitoring and supplemental O2 - Strict Is&Os - Daily weights - 40 mg Lasix IV daily - If respiratory status does not improve with diuresis, low threshold for CTA to rule out PE - Home metoprolol changed to Coreg in setting of cocaine use  Cocaine abuse (HCC) History of cocaine use which could contribute to symptoms. - UDS negative - TOC consult  Elevated liver enzymes AST 64, ALT 72.  Likely transaminitis in the setting of gastric congestion due to heart failure. - Follow-up hepatitis panel - Consider right upper quadrant ultrasound if AST and ALT do not improve with diuresis  Polycythemia Hemoglobin elevated to 18.4.  Likely secondary due to OSA or long-term tobacco use.  Platelets elevated to 541. - Follow-up peripheral blood smear  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[place]) Per prior  notes, is on for chronic pain syndrome.  - Methadone '15mg'$  q8h PRN  Complex atypical endometrial hyperplasia Suspected uterine cancer. Planning for hysterectomy. Is on provera outpatient.  - Continue Provera - Monitor CBC  Tobacco abuse Has history of tobacco use. Has been trying to wean down; currently takes 4 days to smoke one pack. - F/u PCP to encourage continued cessation efforts - Nicotine patch to be ordered upon patient request      FEN/GI: Heart healthy PPx: Lovenox Dispo: Pending clinical improvement and PT OT eval  Subjective:  No acute events overnight.  Denies chest pain some shortness of breath.  Endorses that swelling in feet is better.  No nausea or vomiting.   Objective: Temp:  [97.1 F (36.2 C)-98.7 F (37.1 C)] 97.6 F (36.4 C) (07/17 0740) Pulse Rate:  [74-109] 86 (07/17 0900) Resp:  [17-48] 32 (07/17 0900) BP: (101-152)/(64-100) 146/97 (07/17 0900) SpO2:  [80 %-100 %] 96 % (07/17 0900) FiO2 (%):  [50 %] 50 % (07/16 2038)  I/O-net negative 850  Physical Exam: General: Lying in hospital bed in no acute distress Cardiovascular: Regular rate and rhythm, no murmurs, 1+ pitting edema up to mid shin bilaterally, Respiratory: Bibasilar crackles, normal work of breathing on nasal cannula Abdomen: Soft nontender, palpable uterine mass, no rebound or guarding Extremities: Distal pulses 2+  Laboratory: Most recent CBC Lab Results  Component Value Date   WBC 10.4 07/04/2022   HGB 18.4 (H) 07/04/2022   HGB 18.0 (H) 07/04/2022   HCT 54.0 (H) 07/04/2022   HCT 53.0 (H) 07/04/2022   MCV 90.5 07/04/2022   PLT 541 (H) 07/04/2022   Most  recent BMP    Latest Ref Rng & Units 07/04/2022   11:25 PM  BMP  Glucose 70 - 99 mg/dL 110   BUN 6 - 20 mg/dL 10   Creatinine 0.44 - 1.00 mg/dL 0.91   Sodium 135 - 145 mmol/L 138   Potassium 3.5 - 5.1 mmol/L 3.6   Chloride 98 - 111 mmol/L 106   CO2 22 - 32 mmol/L 20   Calcium 8.9 - 10.3 mg/dL 8.7     Other  pertinent labs: Troponins downtrend to 45 from 64, AST 64, ALT 72  Imaging/Diagnostic Tests: Radiologist Impression:   07/04/22 IMPRESSION: Pulmonary opacities are favored represent pulmonary edema. The opacity is a little more focal in the right base raising the possibility of infiltrate/infection instead. Recommend clinical correlation.   My interpretation: Cardiomegaly, possible pulmonary edema  Salvadore Oxford, MD 07/05/2022, 12:18 PM  PGY-1, [facility] Intern pager: [phone], text pages welcome Secure chat group [place]

## 2022-07-05 NOTE — Assessment & Plan Note (Signed)
-  Continue aspirin -Continue home Imdur

## 2022-07-05 NOTE — Telephone Encounter (Signed)
Patient called to say that she has been admitted into the hospital. Wanted the dr to know

## 2022-07-05 NOTE — ED Notes (Signed)
Pt tolerating crackers and drink, sats 92-96% on 4liters, resp 15-20. Pt repositioned and resting at this time. Will continue to assess need for BIPAP

## 2022-07-05 NOTE — Assessment & Plan Note (Addendum)
Hemoglobin elevated to 18.4.  Likely secondary due to OSA or long-term tobacco use.  Platelets elevated to 541. - Peripheral blood smear - Giant platelets

## 2022-07-05 NOTE — Hospital Course (Addendum)
[name] is a 61 y.o. female with a PMH of CAD, breast cancer, dyslipidemia, HTN, MI, and anemia who presented with SOB likely 2/2 CHF exacerbation. Brief hospital course below.   Acute hypoxemic respiratory failure likely 2/2 CHF exacerbation Patient presented in respiratory distress. Improved with steroids, bronchodilators, ativan, diuretics, BIPAP in ED. ECHO demonstrated moderately elevated pulm artery pressure with elevated LA, RA, IVC size. Cardiology consulted and recommended continued diuresis, outpatient follow-up with Cardiology. On further diuresis, patient reported chest pain with exertion and Cardiology recommended ischemic evaluation. Nuclear Stress test was negative for new ischemic changes. She was continued on diuresis over admission. By discharge, she was weaned down to RA. Weight on day of discharge 110.6 kg.    Cocaine, tobacco use; methadone dependence UDS negative on admission. TOC consulted with recommendations for drug cessation. Methadone continued over admission for chronic pain.   Complex atypical endometrial hyperplasia Suspected uterine cancer. Planning for preop evaluation for possible hysterectomy. Continued Provera over admission.  Issues for follow up: Cardiology will see outpatient. OSA evaluation, consider sleep studies.  Continue 40 Lasix PO daily, continue Zetia, continue potassium supplements 31mq TID. Recommend PCP recheck creatinine at follow up. AKI on admission had resolved to 0.90 on day of discharge. Recommend PCP recheck potassium on follow up. Required multiple rounds of repletion while admitted. K on discharge 3.4. Patient re-started on home K 60 mEq daily.  Heart failure symptom education. Cessation of substances. Reschedule pre-op evaluation for possible hysterectomy.

## 2022-07-05 NOTE — Assessment & Plan Note (Signed)
AST 64, ALT 72.  Likely transaminitis in the setting of gastric congestion due to heart failure. - Follow-up hepatitis panel - Consider right upper quadrant ultrasound if AST and ALT do not improve with diuresis

## 2022-07-05 NOTE — Progress Notes (Signed)
CALL PAGER [phone] for any questions or notifications regarding this patient  FMTS Attending Note: Savannah Mcmurray MD Patients ECHO results reviewed with Dr. Jeani Hawking. Interesting that her right heart parameters have changed since April of 2023. Now she has; - There is moderately elevated pulmonary artery systolic pressure. The estimated right ventricular systolic pressure is 98.9 mmHg. - Left atrial size was mildly dilated.-. Right atrial size was mildly dilated. - The inferior vena cava is dilated in size with <50% respiratory variability, suggesting right atrial pressure of 15 mmHg. -  - Will consult cardiology. She seems to have improved since admission with diuresis and I agree we can continue that but will be cautious as we do not want to acutely drop her preload.  - Hx of breast cancer in 2014. 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 Tamoxifen has been discontinued)  - I do not see any specific hx of pulmonary issues but she does have smoking history; we will continue to peruse chart and will discuss with day attending, Dr. Owens Shark.

## 2022-07-05 NOTE — Plan of Care (Signed)
  Problem: Education: Goal: Ability to demonstrate management of disease process will improve Outcome: Progressing   Problem: Clinical Measurements: Goal: Ability to maintain clinical measurements within normal limits will improve Outcome: Progressing Goal: Will remain free from infection Outcome: Progressing

## 2022-07-05 NOTE — Progress Notes (Signed)
FMTS Brief Progress Note  S: Patient is doing a bit better at this time. She is now off of the bi-pap, does describe some tightness/soreness in her chest with her breathing that has been present for the last month. She is wanting something to eat and drink and is stable on 4L.    O: BP 109/71   Pulse 81   Temp 98.7 F (37.1 C)   Resp 20   LMP 08/07/2011   SpO2 95%   General: NAD, supine in bed CV: RRR Respiratory: mildly increased WOB, on 4L [place] with largely stable O2 saturations  A/P: Respiratory distress Improving, stable off bi-pap for the last 30 minutes, will allow to eat and will closely monitor respiratory status for worsening.  - Maintain O2 goal of >92% - Closely monitor for worsening breathing - Will re-dose lasix per day-team  Rise Patience, DO 07/05/2022, 5:34 AM PGY-3, [facility] Night Resident  Please page [phone] with questions.

## 2022-07-05 NOTE — Progress Notes (Signed)
Pt arrived to room via stretcher from ED. Pt A+OX4, no c/o pain. Pt weighed with standing scale. Oriented to equipment in the room. Admission questions to follow.

## 2022-07-05 NOTE — Progress Notes (Signed)
Patient states she does not wear CPAP or BIPAP.

## 2022-07-06 ENCOUNTER — Telehealth (HOSPITAL_COMMUNITY): Payer: Self-pay | Admitting: Pharmacy Technician

## 2022-07-06 ENCOUNTER — Other Ambulatory Visit (HOSPITAL_COMMUNITY): Payer: Self-pay

## 2022-07-06 DIAGNOSIS — I5021 Acute systolic (congestive) heart failure: Secondary | ICD-10-CM | POA: Diagnosis not present

## 2022-07-06 DIAGNOSIS — F1410 Cocaine abuse, uncomplicated: Secondary | ICD-10-CM | POA: Diagnosis not present

## 2022-07-06 DIAGNOSIS — I248 Other forms of acute ischemic heart disease: Secondary | ICD-10-CM

## 2022-07-06 DIAGNOSIS — J9601 Acute respiratory failure with hypoxia: Secondary | ICD-10-CM | POA: Diagnosis not present

## 2022-07-06 DIAGNOSIS — Z720 Tobacco use: Secondary | ICD-10-CM | POA: Diagnosis not present

## 2022-07-06 DIAGNOSIS — I5033 Acute on chronic diastolic (congestive) heart failure: Secondary | ICD-10-CM | POA: Diagnosis not present

## 2022-07-06 DIAGNOSIS — I2510 Atherosclerotic heart disease of native coronary artery without angina pectoris: Secondary | ICD-10-CM | POA: Diagnosis not present

## 2022-07-06 DIAGNOSIS — N8502 Endometrial intraepithelial neoplasia [EIN]: Secondary | ICD-10-CM | POA: Diagnosis not present

## 2022-07-06 DIAGNOSIS — M170 Bilateral primary osteoarthritis of knee: Secondary | ICD-10-CM | POA: Diagnosis not present

## 2022-07-06 LAB — BASIC METABOLIC PANEL
Anion gap: 7 (ref 5–15)
Anion gap: 12 (ref 5–15)
BUN: 26 mg/dL — ABNORMAL HIGH (ref 6–20)
BUN: 26 mg/dL — ABNORMAL HIGH (ref 6–20)
CO2: 23 mmol/L (ref 22–32)
CO2: 22 mmol/L (ref 22–32)
Calcium: 8.3 mg/dL — ABNORMAL LOW (ref 8.9–10.3)
Calcium: 7.9 mg/dL — ABNORMAL LOW (ref 8.9–10.3)
Chloride: 108 mmol/L (ref 98–111)
Chloride: 108 mmol/L (ref 98–111)
Creatinine, Ser: 1.24 mg/dL — ABNORMAL HIGH (ref 0.44–1.00)
Creatinine, Ser: 1.33 mg/dL — ABNORMAL HIGH (ref 0.44–1.00)
GFR, Estimated: 50 mL/min — ABNORMAL LOW (ref >60)
GFR, Estimated: 46 mL/min — ABNORMAL LOW (ref >60)
Glucose, Bld: 122 mg/dL — ABNORMAL HIGH (ref 70–99)
Glucose, Bld: 158 mg/dL — ABNORMAL HIGH (ref 70–99)
Potassium: 3.1 mmol/L — ABNORMAL LOW (ref 3.5–5.1)
Potassium: 3.4 mmol/L — ABNORMAL LOW (ref 3.5–5.1)
Sodium: 138 mmol/L (ref 135–145)
Sodium: 142 mmol/L (ref 135–145)

## 2022-07-06 LAB — HEPATIC FUNCTION PANEL
ALT: 69 U/L — ABNORMAL HIGH (ref 0–44)
AST: 49 U/L — ABNORMAL HIGH (ref 15–41)
Albumin: 3.2 g/dL — ABNORMAL LOW (ref 3.5–5.0)
Alkaline Phosphatase: 106 U/L (ref 38–126)
Bilirubin, Direct: 0.1 mg/dL (ref 0.0–0.2)
Indirect Bilirubin: 0.5 mg/dL (ref 0.3–0.9)
Total Bilirubin: 0.6 mg/dL (ref 0.3–1.2)
Total Protein: 6.5 g/dL (ref 6.5–8.1)

## 2022-07-06 LAB — MAGNESIUM: Magnesium: 1.8 mg/dL (ref 1.7–2.4)

## 2022-07-06 MED ORDER — EMPAGLIFLOZIN 10 MG PO TABS
10.0000 mg | ORAL_TABLET | Freq: Every day | ORAL | Status: DC
  Administered 2022-07-06 – 2022-07-09 (×4): 10 mg via ORAL
  Filled 2022-07-06 (×4): qty 1

## 2022-07-06 MED ORDER — ACETAMINOPHEN 325 MG PO TABS
650.0000 mg | ORAL_TABLET | Freq: Four times a day (QID) | ORAL | Status: DC | PRN
  Administered 2022-07-06 (×3): 650 mg via ORAL
  Filled 2022-07-06 (×2): qty 2

## 2022-07-06 MED ORDER — FUROSEMIDE 40 MG PO TABS
40.0000 mg | ORAL_TABLET | Freq: Every day | ORAL | Status: DC
  Administered 2022-07-06 – 2022-07-08 (×3): 40 mg via ORAL
  Filled 2022-07-06 (×3): qty 1

## 2022-07-06 MED ORDER — POTASSIUM CHLORIDE CRYS ER 20 MEQ PO TBCR
60.0000 meq | EXTENDED_RELEASE_TABLET | Freq: Once | ORAL | Status: AC
Start: 2022-07-06 — End: 2022-07-06
  Administered 2022-07-06: 60 meq via ORAL
  Filled 2022-07-06: qty 3

## 2022-07-06 NOTE — Progress Notes (Signed)
SATURATION QUALIFICATIONS: (This note is used to comply with regulatory documentation for home oxygen)  Patient Saturations on Room Air at Rest = 96%  Patient Saturations on Room Air while Ambulating = 92%  Patient Saturations on -- Liters of oxygen while Ambulating = N/A

## 2022-07-06 NOTE — Consult Note (Addendum)
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 cigarettes daily - complete cessation advised  Cocaine abuse - h/o cocaine - UDS negative  Elevated LFTs - possbly  from congestion - trending down  Polycythemia - Hgb 18.4 - plt 541  For questions or updates, please contact [place] Please consult [email or web address] for contact info under    Signed, Cadence Ninfa Meeker, PA-C  07/06/2022 11:59 AM    ATTENDING ATTESTATION  I have seen, examined and evaluated the patient this afternoon in consultation along with Cadence Kathlen Mody, PA.  After reviewing all the available data and chart, we discussed the patients laboratory, study & physical findings as well as symptoms in detail. I agree with her findings, examination  as well as impression recommendations as per our discussion.    Attending adjustments noted in italics.   Overall seems to be resolving stages of mild exacerbation of diastolic heart failure.  Minimal echo showed changes suggesting that there was diastolic dysfunction likely related to volume overload on arrival.  Patient not been on diuretic prior to presentation and would likely be discharged on diuretic.  Currently since has been on 40 mg of IV Lasix, would consider 40 mg p.o. however this may be adjusted in the outpatient setting. I have provided sliding scale Lasix recommendations above.  I suspect that if her renal function is stable and she is breathing well she could be ready for discharge as of early tomorrow. We will help arrange outpatient follow-up.    Glenetta Hew, M.D., M.S. Interventional Cardiologist   Pager # [phone] Phone # [phone] [address]. [place],  [address]

## 2022-07-06 NOTE — Plan of Care (Signed)
  Problem: Education: Goal: Ability to demonstrate management of disease process will improve Outcome: Progressing Goal: Individualized Educational Video(s) Outcome: Progressing   Problem: Activity: Goal: Capacity to carry out activities will improve Outcome: Progressing   

## 2022-07-06 NOTE — Telephone Encounter (Addendum)
Patient Advocate Encounter   Received notification that prior authorization for Jardiance '10MG'$  tablets is required.   PA submitted on 07/06/2022 Key [id number] Status is pending       Lyndel Safe, [place] Patient Advocate Specialist [place] Patient Advocate Team Direct Number: [phone]  Fax: [phone]

## 2022-07-06 NOTE — Progress Notes (Addendum)
Daily Progress Note Intern Pager: [phone]  Patient name: [name] Medical record number: [id number] Date of birth: Apr 20, 1961 Age: 61 y.o. Gender: female  Primary Care Provider: [facility] Residency Consultants: None Code Status: Full  Pt Overview and Major Events to Date:  -07/04/22 Admission -07/05/22 Cardiology consulted  Assessment and Plan:  [name] is a 61 y.o. female presenting with SOB likely due to CHF exacerbation. Pertinent PMHx includes anemia, CAD, breast cancer, chronic pain, dyslipidemia, gout, HTN, MI, umbilical hernia.  * Acute exacerbation of CHF (congestive heart failure) ([place]), diastolic Improved. Echo shows new worsening of diastolic failure and pulmonary HTN.  - Strict I/O to monitor UOP s/p 1x lasix, dose accordingly - ECHO this admission grade II diastolic dysfunction with elevated right side heart pressures - Consulted cardiology - On RA, spO2 >95% - Vitals per floor protocol - Continuous cardiac monitoring - Continuous O2 monitoring and supplemental O2 - Strict Is&Os - Daily weights - Transition to PO Lasix '40mg'$  - Carvedilol 25 mg BID   Cocaine abuse (HCC) History of cocaine use which could contribute to symptoms. - UDS negative - TOC consult  CAD (coronary artery disease) -Continue aspirin -Continue home Imdur - Continue home statin   Elevated liver enzymes AST 64, ALT 72.  Likely transaminitis in the setting of gastric congestion due to heart failure. - Hepatitis panel negative - Consider right upper quadrant ultrasound if AST and ALT do not improve with diuresis  Polycythemia Hemoglobin elevated to 18.4.  Likely secondary due to OSA or long-term tobacco use.  Platelets elevated to 541. 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  - Methadone '15mg'$  q8h PRN  Complex atypical endometrial  hyperplasia Suspected uterine cancer. Planning for hysterectomy. Is on provera outpatient.  - Continue Provera - Monitor CBC  Tobacco abuse Has history of tobacco use. Has been trying to wean down; currently takes 4 days to smoke one pack. - F/u PCP to encourage continued cessation efforts - Nicotine patch to be ordered upon patient request  FEN/GI: Heart healthy PPx: Lovenox Dispo: Pending clinical improvement and PT OT eval  Subjective:  Acute events overnight.  Denies chest pain, some shortness of breath but is no longer requiring oxygen.  Endorses frequent urination due to Lasix.  Objective: Temp:  [97.6 F (36.4 C)-98.6 F (37 C)] 98 F (36.7 C) (07/18 0729) Pulse Rate:  [42-88] 76 (07/18 0911) Resp:  [14-27] 17 (07/18 0906) BP: (108-160)/(69-132) 113/71 (07/18 0911) SpO2:  [91 %-100 %] 94 % (07/18 0906) Weight:  [110 kg-110.4 kg] 110.4 kg (07/18 0355) Physical Exam: General: Patient lying comfortably in hospital bed sleeping Cardiovascular: Regular rate and rhythm, no murmurs rubs or gallops, 1+ pitting edema bilateral lower extremity (improved since yesterday) Respiratory: Mildly increased work of breathing on room air, no bibasilar crackles Abdomen: non-tender, no rebound or guarding Extremities: Bilateral dorsalis pedis pulses 2+  Laboratory: Most recent CBC Lab Results  Component Value Date   WBC 10.4 07/04/2022   HGB 18.4 (H) 07/04/2022   HGB 18.0 (H) 07/04/2022   HCT 54.0 (H) 07/04/2022   HCT 53.0 (H) 07/04/2022   MCV 90.5 07/04/2022   PLT 541 (H) 07/04/2022   Most recent BMP    Latest Ref Rng & Units 07/06/2022    4:23 AM  BMP  Glucose 70 - 99 mg/dL 122   BUN 6 -  20 mg/dL 26   Creatinine 0.44 - 1.00 mg/dL 1.24   Sodium 135 - 145 mmol/L 138   Potassium 3.5 - 5.1 mmol/L 3.1   Chloride 98 - 111 mmol/L 108   CO2 22 - 32 mmol/L 23   Calcium 8.9 - 10.3 mg/dL 8.3     Other pertinent labs: Mg 1.8, previous 1.6. Peripheral smear - Giant  Platelets  Imaging/Diagnostic Tests:  07/05/22 ECHO IMPRESSIONS   1. Left ventricular ejection fraction, by estimation, is 55 to 60%. The  left ventricle has normal function. The left ventricle has no regional  wall motion abnormalities. There is mild left ventricular hypertrophy.  Left ventricular diastolic parameters  are consistent with Grade II diastolic dysfunction (pseudonormalization).   2. Right ventricular systolic function is normal. The right ventricular  size is normal. There is moderately elevated pulmonary artery systolic  pressure. The estimated right ventricular systolic pressure is 16.6 mmHg.   3. Left atrial size was mildly dilated.   4. Right atrial size was mildly dilated.   5. The mitral valve is normal in structure. Trivial mitral valve  regurgitation. No evidence of mitral stenosis.   6. The aortic valve is tricuspid. Aortic valve regurgitation is not  visualized. No aortic stenosis is present.   7. The inferior vena cava is dilated in size with <50% respiratory  variability, suggesting right atrial pressure of 15 mmHg.  Savannah Oxford MD   PGY-1, [place] Intern pager: [phone], text pages welcome Secure chat group [place]

## 2022-07-06 NOTE — Assessment & Plan Note (Deleted)
-  ECHO showed changed from grade 1 diastolic dysfunction to grade 2 with new elevated right side pressures -[place] -Cardiology consulted

## 2022-07-06 NOTE — Progress Notes (Signed)
Mobility Specialist: Progress Note   07/06/22 1340  Mobility  Activity Ambulated with assistance in hallway  Level of Assistance Contact guard assist, steadying assist  Assistive Device Four point cane  Distance Ambulated (ft) 120 ft  Activity Response Tolerated well  $Mobility charge 1 Mobility   Pre-Mobility: 64 HR, 96% SpO2 During Mobility: 92% SpO2 Post-Mobility: 78 HR, 93% SpO2  Pt received in the bed and agreeable to mobility. C/o SOB during ambulation, otherwise asymptomatic. Pt back to bed after session with call bell and phone at her side.   [facility] Nodal Mobility Specialist Mobility Specialist [address] [place]: [phone]

## 2022-07-06 NOTE — Telephone Encounter (Signed)
Pharmacy Patient Advocate Encounter  Insurance verification completed.    The patient is insured through [facility]   The patient is currently admitted and ran test claims for the following: Jardiance 10 mg.  Copays and coinsurance results were relayed to Inpatient clinical team.

## 2022-07-06 NOTE — TOC Initial Note (Addendum)
Transition of Care [facility]) - Initial/Assessment Note    Patient Details  Name: [name] MRN: [id number] Date of Birth: 1961-12-18  [facility] CM/SW Contact:    Zenon Mayo, RN Phone Number: 07/06/2022, 3:07 PM  Clinical Narrative:                 Patient lives with her fiance, who is out of town right now.  Patient is indep.  She states she will need a cane, she is ok with [employer] supplying this for her. NCM made referral to [facility] with [facility].   She has an aide with [facility]  from 6 am to 8 am Mon thru Sat.  She has a PCP, she goes to [facility] on [address] and she also uses [facility] for her pain medications.  Presents with CHF , conts on IV lasix, hx PSA, NCM gave her SA resources.   She states she may need asst with transportation at discharge. TOC following.   Expected Discharge Plan: Home/Self Care Barriers to Discharge: Continued Medical Work up   Patient Goals and CMS Choice Patient states their goals for this hospitalization and ongoing recovery are:: return home   Choice offered to / list presented to : NA  Expected Discharge Plan and Services Expected Discharge Plan: Home/Self Care In-house Referral: NA Discharge Planning Services: CM Consult Post Acute Care Choice: NA Living arrangements for the past 2 months: Single Family Home                 DME Arranged: Kasandra Knudsen DME Agency: [employer] Date DME Agency Contacted: 07/06/22 Time DME Agency Contacted: [phone] Representative spoke with at DME Agency: Jodell Cipro HH Arranged: NA          Prior Living Arrangements/Services Living arrangements for the past 2 months: Single Family Home Lives with:: Significant Other (fiance) Patient language and need for interpreter reviewed:: Yes Do you feel safe going back to the place where you live?: Yes      Need for Family Participation in Patient Care: Yes (Comment) Care giver support system in place?: Yes (comment)   Criminal  Activity/Legal Involvement Pertinent to Current Situation/Hospitalization: No - Comment as needed  Activities of Daily Living Home Assistive Devices/Equipment: Cane (specify quad or straight) ADL Screening (condition at time of admission) Patient's cognitive ability adequate to safely complete daily activities?: Yes Is the patient deaf or have difficulty hearing?: No Does the patient have difficulty seeing, even when wearing glasses/contacts?: No Does the patient have difficulty concentrating, remembering, or making decisions?: No Patient able to express need for assistance with ADLs?: Yes Does the patient have difficulty dressing or bathing?: No Independently performs ADLs?: No Communication: Independent Dressing (OT): Independent Grooming: Independent Feeding: Independent Bathing: Independent with device (comment) Toileting: Independent In/Out Bed: Independent Walks in Home: Independent Does the patient have difficulty walking or climbing stairs?: Yes Weakness of Legs: Both Weakness of Arms/Hands: None  Permission Sought/Granted                  Emotional Assessment Appearance:: Appears stated age Attitude/Demeanor/Rapport: Engaged Affect (typically observed): Appropriate Orientation: : Oriented to Self, Oriented to Place, Oriented to  Time, Oriented to Situation Alcohol / Substance Use: Not Applicable Psych Involvement: No (comment)  Admission diagnosis:  Shortness of breath [R06.02] CHF (congestive heart failure) (HCC) [I50.9] Acute CHF (congestive heart failure) ([place]) [I50.9] Patient Active Problem List   Diagnosis Date Noted   Acute CHF (congestive heart failure) ([place])  07/05/2022   Polycythemia 07/05/2022   Elevated liver enzymes 07/05/2022   Acute exacerbation of CHF (congestive heart failure) ([place]) 07/04/2022   CHF (congestive heart failure) ([place]) 07/04/2022   At risk for obstructive sleep apnea 06/15/2022   Mood altered 06/15/2022   Cocaine abuse ([place])  03/31/2022   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[place]) 03/31/2022   Tension headache 03/31/2022   Chest pain 03/30/2022   Hypomagnesemia 03/30/2022    Class: Acute   Constipation 03/30/2022   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   BMI 40.0-44.9, adult ([place]) 10/26/2021   COVID-19 vaccine administered 07/27/2021   Prediabetes 01/01/2021   Concern about ear disease without diagnosis 01/01/2021   Neck mass 11/05/2020   Arthritis of right knee 06/11/2020   Acquired absence of both breasts 04/01/2020   Bilateral leg edema 06/21/2019   Dysfunctional uterine bleeding 04/30/2019   Right leg pain 09/13/2018   Acquired trigger finger of right ring finger 05/29/2018   Hyperlipidemia LDL goal <70 02/08/2018   History of ST elevation myocardial infarction (STEMI) 12/01/2017   Endometrial hyperplasia, simple 11/08/2017   Unilateral primary osteoarthritis, right knee 10/27/2017   Essential hypertension 04/17/2016   CAD (coronary artery disease) 04/17/2016   Thrombocytosis 03/10/2015   Tobacco abuse 12/02/2014   Cancer of overlapping sites of right female breast ([place]) 09/16/2014   Breast cancer of upper-outer quadrant of left female breast ([place]) 09/16/2014   Hot flashes due to tamoxifen 05/09/2014   Lymphedema of arm 11/08/2013   Hypokalemia 03/31/2012   Atherosclerosis of aorta ([place])    PCP:  Orvis Brill, DO Pharmacy:  No Pharmacies Listed    Social Determinants of Health (SDOH) Interventions    Readmission Risk Interventions    07/06/2022    3:02 PM  Readmission Risk Prevention Plan  Transportation Screening Complete  PCP or Specialist Appt within 3-5 Days Complete  HRI or [place] Complete  Social Work Consult for [place] Planning/Counseling Complete  Palliative Care Screening Not Applicable  Medication Review Press photographer) Complete

## 2022-07-06 NOTE — TOC Benefit Eligibility Note (Signed)
Patient Teacher, English as a foreign language completed.    The patient is currently admitted and upon discharge could be taking Jardiance 10 mg.  Requires Prior Authorization  The patient is insured through [place], [place] Patient Advocate Specialist [place] Patient Advocate Team Direct Number: [phone]  Fax: [phone]

## 2022-07-07 ENCOUNTER — Other Ambulatory Visit (HOSPITAL_COMMUNITY): Payer: Self-pay

## 2022-07-07 ENCOUNTER — Ambulatory Visit: Payer: Medicaid Other | Admitting: Physician Assistant

## 2022-07-07 DIAGNOSIS — I5033 Acute on chronic diastolic (congestive) heart failure: Secondary | ICD-10-CM | POA: Diagnosis not present

## 2022-07-07 DIAGNOSIS — I5031 Acute diastolic (congestive) heart failure: Secondary | ICD-10-CM | POA: Diagnosis not present

## 2022-07-07 DIAGNOSIS — M170 Bilateral primary osteoarthritis of knee: Secondary | ICD-10-CM | POA: Diagnosis not present

## 2022-07-07 LAB — COMPREHENSIVE METABOLIC PANEL
ALT: 69 U/L — ABNORMAL HIGH (ref 0–44)
AST: 39 U/L (ref 15–41)
Albumin: 2.6 g/dL — ABNORMAL LOW (ref 3.5–5.0)
Alkaline Phosphatase: 91 U/L (ref 38–126)
Anion gap: 8 (ref 5–15)
BUN: 27 mg/dL — ABNORMAL HIGH (ref 6–20)
CO2: 25 mmol/L (ref 22–32)
Calcium: 8.1 mg/dL — ABNORMAL LOW (ref 8.9–10.3)
Chloride: 107 mmol/L (ref 98–111)
Creatinine, Ser: 1.09 mg/dL — ABNORMAL HIGH (ref 0.44–1.00)
GFR, Estimated: 59 mL/min — ABNORMAL LOW (ref >60)
Glucose, Bld: 117 mg/dL — ABNORMAL HIGH (ref 70–99)
Potassium: 3.3 mmol/L — ABNORMAL LOW (ref 3.5–5.1)
Sodium: 140 mmol/L (ref 135–145)
Total Bilirubin: 0.3 mg/dL (ref 0.3–1.2)
Total Protein: 6 g/dL — ABNORMAL LOW (ref 6.5–8.1)

## 2022-07-07 LAB — CBC
HCT: 42.8 % (ref 36.0–46.0)
Hemoglobin: 14.3 g/dL (ref 12.0–15.0)
MCH: 28.2 pg (ref 26.0–34.0)
MCHC: 33.4 g/dL (ref 30.0–36.0)
MCV: 84.4 fL (ref 80.0–100.0)
Platelets: 433 10*3/uL — ABNORMAL HIGH (ref 150–400)
RBC: 5.07 MIL/uL (ref 3.87–5.11)
RDW: 15.4 % (ref 11.5–15.5)
WBC: 11.9 10*3/uL — ABNORMAL HIGH (ref 4.0–10.5)
nRBC: 0 % (ref 0.0–0.2)

## 2022-07-07 LAB — MAGNESIUM: Magnesium: 1.8 mg/dL (ref 1.7–2.4)

## 2022-07-07 MED ORDER — ACETAMINOPHEN 325 MG PO TABS
650.0000 mg | ORAL_TABLET | Freq: Four times a day (QID) | ORAL | Status: DC | PRN
  Administered 2022-07-07 – 2022-07-09 (×4): 650 mg via ORAL
  Filled 2022-07-07 (×3): qty 2

## 2022-07-07 MED ORDER — FLUTICASONE PROPIONATE 50 MCG/ACT NA SUSP
1.0000 | Freq: Every day | NASAL | Status: DC
Start: 2022-07-07 — End: 2022-07-09
  Administered 2022-07-08 – 2022-07-09 (×2): 1 via NASAL
  Filled 2022-07-07: qty 16

## 2022-07-07 MED ORDER — POTASSIUM CHLORIDE CRYS ER 20 MEQ PO TBCR
40.0000 meq | EXTENDED_RELEASE_TABLET | Freq: Once | ORAL | Status: AC
  Administered 2022-07-07: 40 meq via ORAL
  Filled 2022-07-07: qty 2

## 2022-07-07 MED ORDER — MAGNESIUM SULFATE 2 GM/50ML IV SOLN
2.0000 g | Freq: Once | INTRAVENOUS | Status: AC
  Administered 2022-07-07: 2 g via INTRAVENOUS
  Filled 2022-07-07: qty 50

## 2022-07-07 NOTE — Progress Notes (Signed)
PT Cancellation Note  Patient Details Name: [name] MRN: [id number] DOB: 02/17/1961   Cancelled Treatment:    Reason Eval/Treat Not Completed: Patient declined, no reason specified. Pt declines PT twice today, stating that she just walked with OT and doesn't feel like doing any more movement today. Says she would rather participate tomorrow. PT will follow-up when time permits.   Ashia Dehner 07/07/2022, 2:41 PM

## 2022-07-07 NOTE — Plan of Care (Signed)
  Problem: Education: Goal: Ability to demonstrate management of disease process will improve Outcome: Progressing Goal: Individualized Educational Video(s) Outcome: Progressing   Problem: Activity: Goal: Capacity to carry out activities will improve Outcome: Progressing   

## 2022-07-07 NOTE — Discharge Instructions (Addendum)
Dear [name],  Thank you for letting us participate in your care. You were hospitalized for Acute exacerbation of CHF (congestive heart failure) ([name]).   POST-HOSPITAL & CARE INSTRUCTIONS Continue Lasix 20 mg daily Continue potassium supplementation with 20 mEq 3 times daily (same as you are taking before at home). Start taking Zetia as prescribed.  This is a cholesterol reducing medication.  The heart doctor started for you in the hospital. Go see your primary care clinic.  Appointment details below.  They will check your potassium to make sure it is normal. Go to your follow up appointments (listed below)  Sliding scale Lasix (for '40mg'$  Lasix Basal Dose):             Weigh yourself when you get home, then Daily in the Morning.  Your dry weight will be what your scale says on the day you return home. If you gain more than 3 pounds from dry weight: Increase the Lasix dosing to 40 mg in the morning and 40 mg in the afternoon (or 80 mg - 2 tabs in the morning) Daily until weight returns to baseline dry weight. If weight gain is greater than 5 pounds in 2 days: Increased to Lasix 80 mg in the morning & 40 mg in the afternoon and  Contact the office for further assistance if weight does not go down the next day.  Then continue to take 80 mg in morning (or 40 mg 2 x daily) until back to dry weight If the weight goes down more than 3 pounds from dry weight: Hold Lasix until it returns to baseline dry weight  DOCTOR'S APPOINTMENT   Future Appointments  Date Time Provider [place]  07/15/2022  3:30 PM Lafonda Mosses, MD [facility] None  07/19/2022  9:20 AM ACCESS TO CARE POOL [facility] [place]  07/19/2022 10:55 AM [name], PA-C [facility]  07/26/2022 11:15 AM Edrick Kins, DPM [facility]  12/23/2022  1:15 PM Causey, Charlestine Massed, NP [facility] None    Follow-up [place], [facility] Follow up.   Why:  cane Contact information: [address] [address] [phone]         [name], PA-C Follow up.   Specialties: Cardiology, Radiology Why: [place] location - cardiology follow-up has been rescheduled to Monday Jul 19, 2022 at 10:55 AM (Arrive by 10:40 AM). Contact information: [address] [place] [address] [phone]         [place]. Go on 07/19/2022.   Specialty: Family Medicine Why: At 9:20 AM.  Please arrive by 9:05 AM.  This is a follow-up appointment with your primary care clinic.  Your PCP was unavailable, so you will see another resident in this clinic.  If this times a day does not work well for you, please call the office directly to reschedule.  Please note, this is scheduled before your cardiology appointment.  If this is too close, please call the clinic and reschedule. Contact information: [address] [id number] [place] [phone]              Take care and be well!  [facility]  [place], [place] [address] [phone]

## 2022-07-07 NOTE — Telephone Encounter (Signed)
Patient Advocate Encounter  Prior Authorization for Jardiance '10MG'$  tablets has been approved.    PA# [id number] Effective dates: 07/06/2022 through 07/07/2023     Lyndel Safe, [place] Patient Advocate Specialist [place] Patient Advocate Team Direct Number: [phone]  Fax: [phone]

## 2022-07-07 NOTE — Progress Notes (Addendum)
Progress Note  Patient Name: [name] Date of Encounter: 07/07/2022  Primary Cardiologist: Sinclair Grooms, MD  Subjective   C/o general weakness, still feeling like legs are swollen. Also notes generalized headache. No CP. Breathing better but still with some DOE. Reports severe snoring, denies prior dx of OSA.  Inpatient Medications    Scheduled Meds:  allopurinol  300 mg Oral Daily   aspirin EC  81 mg Oral Daily   atorvastatin  40 mg Oral Daily   carvedilol  25 mg Oral BID WC   cholecalciferol  1,000 Units Oral Daily   empagliflozin  10 mg Oral Daily   enoxaparin (LOVENOX) injection  40 mg Subcutaneous Q24H   fluticasone  1 spray Each Nare Daily   furosemide  40 mg Oral Daily   isosorbide mononitrate  60 mg Oral QHS   medroxyPROGESTERone  20 mg Oral Daily   Continuous Infusions:  magnesium sulfate bolus IVPB     PRN Meds: acetaminophen, methadone   Vital Signs    Vitals:   07/07/22 0015 07/07/22 0100 07/07/22 0429 07/07/22 0832  BP: 111/62  110/70 112/60  Pulse: 72  70 81  Resp: '20  16 20  '$ Temp: 98.3 F (36.8 C)  98.2 F (36.8 C)   TempSrc: Oral  Oral   SpO2: 98%  93% 97%  Weight:  111.6 kg    Height:        Intake/Output Summary (Last 24 hours) at 07/07/2022 0952 Last data filed at 07/07/2022 0837 Gross per 24 hour  Intake 695 ml  Output 1300 ml  Net -605 ml      07/07/2022    1:00 AM 07/06/2022    3:55 AM 07/05/2022    6:23 PM  Last 3 Weights  Weight (lbs) 246 lb 1.6 oz 243 lb 6.4 oz 242 lb 8.1 oz  Weight (kg) 111.63 kg 110.406 kg 110 kg     Telemetry    NSR, occasional PVCs - Personally Reviewed  Physical Exam   GEN: No acute distress.  HEENT: Normocephalic, atraumatic, sclera non-icteric. Neck: No JVD or bruits. Cardiac: RRR no murmurs, rubs, or gallops.  Respiratory: Clear to auscultation bilaterally. Breathing is unlabored. GI: Soft, nontender, non-distended, BS +x 4. MS: no deformity. Extremities: No clubbing or cyanosis.  Soft 1+ bilateral LE edema. Distal pedal pulses are 2+ and equal bilaterally. Neuro:  AAOx3. Follows commands. Psych:  Responds to questions appropriately with a normal affect.  Labs    High Sensitivity Troponin:   Recent Labs  Lab 07/04/22 2038 07/04/22 2325 07/05/22 0609  TROPONINIHS 19* 64* 45*      Cardiac EnzymesNo results for input(s): "TROPONINI" in the last 168 hours. No results for input(s): "TROPIPOC" in the last 168 hours.   Chemistry Recent Labs  Lab 07/04/22 2325 07/05/22 1803 07/06/22 0423 07/06/22 1356 07/07/22 0343  NA 138   < > 138 142 140  K 3.6   < > 3.1* 3.4* 3.3*  CL 106   < > 108 108 107  CO2 20*   < > '23 22 25  '$ GLUCOSE 110*   < > 122* 158* 117*  BUN 10   < > 26* 26* 27*  CREATININE 0.91   < > 1.24* 1.33* 1.09*  CALCIUM 8.7*   < > 8.3* 7.9* 8.1*  PROT 7.4  --  6.5  --  6.0*  ALBUMIN 3.6  --  3.2*  --  2.6*  AST 64*  --  49*  --  39  ALT 72*  --  69*  --  69*  ALKPHOS 124  --  106  --  91  BILITOT 0.7  --  0.6  --  0.3  GFRNONAA >60   < > 50* 46* 59*  ANIONGAP 12   < > '7 12 8   '$ < > = values in this interval not displayed.     Hematology Recent Labs  Lab 07/04/22 2038 07/04/22 2047 07/07/22 0343  WBC 10.4  --  11.9*  RBC 6.01*  --  5.07  HGB 16.8* 18.0*  18.4* 14.3  HCT 54.4* 53.0*  54.0* 42.8  MCV 90.5  --  84.4  MCH 28.0  --  28.2  MCHC 30.9  --  33.4  RDW 16.2*  --  15.4  PLT 541*  --  433*    BNP Recent Labs  Lab 07/04/22 2038  BNP 915.1*     DDimer No results for input(s): "DDIMER" in the last 168 hours.   Radiology    ECHOCARDIOGRAM COMPLETE  Result Date: 07/05/2022    ECHOCARDIOGRAM REPORT   Patient Name:   ARES TEGTMEYER Date of Exam: 07/05/2022 Medical Rec #:  [id number]       Height:       65.0 in Accession #:    [id number]      Weight:       245.7 lb Date of Birth:  October 06, 1961      BSA:          2.159 m Patient Age:    35 years        BP:           159/96 mmHg Patient Gender: F               HR:           90  bpm. Exam Location:  Inpatient Procedure: 2D Echo, Cardiac Doppler and Color Doppler Indications:    CHF  History:        Patient has prior history of Echocardiogram examinations, most                 recent 03/30/2022. CHF, CAD; Risk Factors:Hypertension.  Sonographer:    Jefferey Pica Referring Phys: [id number] [place]  1. Left ventricular ejection fraction, by estimation, is 55 to 60%. The left ventricle has normal function. The left ventricle has no regional wall motion abnormalities. There is mild left ventricular hypertrophy. Left ventricular diastolic parameters are consistent with Grade II diastolic dysfunction (pseudonormalization).  2. Right ventricular systolic function is normal. The right ventricular size is normal. There is moderately elevated pulmonary artery systolic pressure. The estimated right ventricular systolic pressure is 30.8 mmHg.  3. Left atrial size was mildly dilated.  4. Right atrial size was mildly dilated.  5. The mitral valve is normal in structure. Trivial mitral valve regurgitation. No evidence of mitral stenosis.  6. The aortic valve is tricuspid. Aortic valve regurgitation is not visualized. No aortic stenosis is present.  7. The inferior vena cava is dilated in size with <50% respiratory variability, suggesting right atrial pressure of 15 mmHg. Comparison(s): Prior images reviewed side by side. Conclusion(s)/Recommendation(s): Grade 2 diastolic dysfunction and elevated right sided pressures, consistent with diastolic heart failure. FINDINGS  Left Ventricle: Left ventricular ejection fraction, by estimation, is 55 to 60%. The left ventricle has normal function. The left ventricle has no regional wall motion abnormalities. The left ventricular internal cavity size was normal in  size. There is  mild left ventricular hypertrophy. Left ventricular diastolic parameters are consistent with Grade II diastolic dysfunction (pseudonormalization). Right Ventricle: The  right ventricular size is normal. Right vetricular wall thickness was not well visualized. Right ventricular systolic function is normal. There is moderately elevated pulmonary artery systolic pressure. The tricuspid regurgitant velocity is 3.07 m/s, and with an assumed right atrial pressure of 15 mmHg, the estimated right ventricular systolic pressure is 65.6 mmHg. Left Atrium: Left atrial size was mildly dilated. Right Atrium: Right atrial size was mildly dilated. Pericardium: Trivial pericardial effusion is present. Presence of epicardial fat layer. Mitral Valve: The mitral valve is normal in structure. Trivial mitral valve regurgitation. No evidence of mitral valve stenosis. Tricuspid Valve: The tricuspid valve is normal in structure. Tricuspid valve regurgitation is trivial. No evidence of tricuspid stenosis. Aortic Valve: The aortic valve is tricuspid. Aortic valve regurgitation is not visualized. No aortic stenosis is present. Aortic valve peak gradient measures 11.0 mmHg. Pulmonic Valve: The pulmonic valve was not well visualized. Pulmonic valve regurgitation is not visualized. No evidence of pulmonic stenosis. Aorta: The aortic root, ascending aorta, aortic arch and descending aorta are all structurally normal, with no evidence of dilitation or obstruction. Venous: The inferior vena cava is dilated in size with less than 50% respiratory variability, suggesting right atrial pressure of 15 mmHg. IAS/Shunts: The atrial septum is grossly normal.  LEFT VENTRICLE PLAX 2D LVIDd:         5.30 cm      Diastology LVIDs:         4.10 cm      LV e' lateral:   6.96 cm/s LV PW:         1.30 cm      LV E/e' lateral: 17.7 LV IVS:        1.20 cm LVOT diam:     2.00 cm LV SV:         80 LV SV Index:   37 LVOT Area:     3.14 cm  LV Volumes (MOD) LV vol d, MOD A4C: 171.0 ml LV vol s, MOD A4C: 74.9 ml LV SV MOD A4C:     171.0 ml RIGHT VENTRICLE          IVC RV Basal diam:  3.05 cm  IVC diam: 2.60 cm TAPSE (M-mode): 2.2 cm LEFT  ATRIUM             Index        RIGHT ATRIUM           Index LA diam:        3.90 cm 1.81 cm/m   RA Area:     19.50 cm LA Vol (A2C):   60.4 ml 27.98 ml/m  RA Volume:   53.30 ml  24.69 ml/m LA Vol (A4C):   75.2 ml 34.83 ml/m LA Biplane Vol: 67.8 ml 31.40 ml/m  AORTIC VALVE                 PULMONIC VALVE AV Area (Vmax): 2.44 cm     PV Vmax:       0.91 m/s AV Vmax:        166.00 cm/s  PV Peak grad:  3.3 mmHg AV Peak Grad:   11.0 mmHg LVOT Vmax:      129.00 cm/s LVOT Vmean:     81.800 cm/s LVOT VTI:       0.255 m  AORTA Ao Root diam: 3.10 cm Ao Asc diam:  2.90 cm MITRAL VALVE                TRICUSPID VALVE MV Area (PHT): 4.01 cm     TR Peak grad:   37.7 mmHg MV Decel Time: 189 msec     TR Vmax:        307.00 cm/s MV E velocity: 123.00 cm/s MV A velocity: 153.00 cm/s  SHUNTS MV E/A ratio:  0.80         Systemic VTI:  0.26 m                             Systemic Diam: 2.00 cm Buford Dresser MD Electronically signed by Buford Dresser MD Signature Date/Time: 07/05/2022/12:13:07 PM    Final     Cardiac Studies   2D Echo 07/05/22    1. Left ventricular ejection fraction, by estimation, is 55 to 60%. The  left ventricle has normal function. The left ventricle has no regional  wall motion abnormalities. There is mild left ventricular hypertrophy.  Left ventricular diastolic parameters  are consistent with Grade II diastolic dysfunction (pseudonormalization).   2. Right ventricular systolic function is normal. The right ventricular  size is normal. There is moderately elevated pulmonary artery systolic  pressure. The estimated right ventricular systolic pressure is 41.2 mmHg.   3. Left atrial size was mildly dilated.   4. Right atrial size was mildly dilated.   5. The mitral valve is normal in structure. Trivial mitral valve  regurgitation. No evidence of mitral stenosis.   6. The aortic valve is tricuspid. Aortic valve regurgitation is not  visualized. No aortic stenosis is present.   7.  The inferior vena cava is dilated in size with <50% respiratory  variability, suggesting right atrial pressure of 15 mmHg.   Comparison(s): Prior images reviewed side by side.   Conclusion(s)/Recommendation(s): Grade 2 diastolic dysfunction and  elevated right sided pressures, consistent with diastolic heart failure.   Patient Profile     61 y.o. female with CAD with MI 2008 s/p BMS to RCA, DES to LAD 2009, cath, HTN, HLD, pre-DM, obesity, tobacco abuse, prior cocaine use, aortic atherosclerosis, breast CA s/p bilateral mastectomy, suspected uterine CA with plan for hysterectomy. Last cath 2014 with patent stents and otherwise nonobstructive disease. She had a nuc stress for surgical clearance in 2021 that was abnormal with small reversible defect in the apex region as well as fixed defect in the basal/inferior location c/w prior infarct - findings c/w ischemia and prior MI, though small amount of ischemia - Richardson Dopp had reviewed with Dr. Tamala Julian and felt unchanged from prior so medical therapy recommended. Was admitted 03/2022 with chest pain and negative troponins but + cocaine. Echo showed EF 60-65%, g1DD, mild LAE, trivial pericardial effusion. Surgical clearance for hysterectomy also requested at that time though Dr. Margaretann Loveless commented that she frankly could not provide cardiac risk stratification in setting of positive UDS, and OP follow-up was recommended. She no-showed to her 04/2022 and 05/2022 cardiology visits.  Presented this admission with respiratory distress and hypoxia requiring CPAP. RVP, UDS neg. CXR showed pulmonary edema. Admitted for diastolic CHF.  Assessment & Plan    1. Acute hypoxic respiratory failure, acute diastolic CHF - 2D echo showed EF 55-60%, mild LVH, G2DD, normal RV, moderately elevated PASP, mild BAE, dilated IVC -> consistent with diastolic CHF - no prior history of CHF exacerbation that I can see, inciting event not totally clear  though I have concerns for  underlying OSA so we can plan to set her up with an [place] sleep study at her follow-up visit - looks to have turned around quickly with several doses of IV Lasix, transitioned to oral form yesterday but still with volume on board - will review with Dr. Margaretann Loveless to advise on further dosing - IM has held off PE r/o - no longer tachycardic, hypoxic - in the meantime, continue carvedilol (transitioned from home metoprolol), Jardiance (new), Imdur (home med), Lasix (new) - IM team held amlodipine - since BP acceptable now, OK to continue to hold. If she relapses with cocaine, can consider transitioning off BB and onto CCB again since LVEF normal  2. Essential HTN - manage in context above  3. CAD as above, with mildly elevated troponin - this has been suspected due to demand ischemia in setting of acute respiratory failure and CHF, no chest pain reported - continue medical management  4. Tobacco abuse, prior h/o cocaine use, methadone use - tobacco felt to be contributing to breathing issues, cessation encouraged - UDS neg  5.  ? Preop clearance for hysterectomy - per GYN notes, missed several appts to get this arranged as well - will ask for MD commentary on thoughts for clearance and holding ASA if needed  6. 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  For questions or updates, please contact [place] Please consult [email or web address] for contact info under Cardiology/STEMI.  Signed, [name], PA-C 07/07/2022, 9:52 AM    Patient seen and examined with Melina Copa PA-C.  Agree as above, with the following exceptions and changes as noted below. No chest pain, but  continues to have exertional dyspnea even walking to the bathroom. We discussed intermediate risk nuc and lack of consistent follow up. Discussed barriers to presentation to appts and the critical nature of CV follow up for CV optimization so she can have her hysterectomy with concern for cancerous lesions. Gen: NAD, CV: RRR, no murmurs, Lungs: bibasilar crackles, Abd: soft, Extrem: Warm, well perfused, puffy edema, Neuro/Psych: alert and oriented x 3, normal mood and affect. All available labs, radiology testing, previous records reviewed. Indeterminate diastolic function on echo but with mild LA dilation, likely has some degree of diastolic dysfunction. She is on PO lasix, ok to continue today. Needs to treat OSA. When ready for discharge, would plan for outpatient nuclear stress test to further risk stratify her DOE and for preop purposes. I have emphasized that she needs to see [name] in the office. She uses [facility] and finds this helps remove barriers to care.  Elouise Munroe, MD 07/07/22 12:53 PM

## 2022-07-07 NOTE — Evaluation (Signed)
Occupational Therapy Evaluation Patient Details Name: [name] MRN: [id number] DOB: 08-03-1961 Today's Date: 07/07/2022   History of Present Illness 61 yo presenting with acute hypoxemic respiratory failure, likely due to heart failure. Has had 30 pound weight per patient in 3 months. Endorses orthopnea, no PND. Has had bilateral LE edema. PMHx:  CAD s/p stent in 2008 (BMS to RCA, DES to LAD in '09), tobacco and cocaine use, HTN, and obesity.   Clinical Impression   Pt PTA Pt living independently with aid for 2 hours daily. Pt reports using SPC for mobility and use of aid mostly for iADL tasks that pt is too fatigued to do. Pt currently, limited by SOB, but able to perform own ADL tasks sitting and standing with increased time for rest breaks with modified independence. Pt performing ambulation tasks with SPC ~145* feet with modified independence. SpO2 >90% O2 on RA.  HR 80-90s with exertion. Pt appears very close to baseline functional status. Pt reports sedentary lifestyle. Mobility tech team can take over to encourage OOB mobility at this time. No further skilled OT skilled services. OT signing off.     Recommendations for follow up therapy are one component of a multi-disciplinary discharge planning process, led by the attending physician.  Recommendations may be updated based on patient status, additional functional criteria and insurance authorization.   Follow Up Recommendations  No OT follow up    Assistance Recommended at Discharge Set up Supervision/Assistance  Patient can return home with the following      Functional Status Assessment  Patient has had a recent decline in their functional status and demonstrates the ability to make significant improvements in function in a reasonable and predictable amount of time.  Equipment Recommendations  None recommended by OT    Recommendations for Other Services       Precautions / Restrictions Precautions Precautions:  None Restrictions Weight Bearing Restrictions: No      Mobility Bed Mobility Overal bed mobility: Modified Independent                  Transfers Overall transfer level: Modified independent                        Balance Overall balance assessment: No apparent balance deficits (not formally assessed)                                         ADL either performed or assessed with clinical judgement   ADL Overall ADL's : Modified independent                                       General ADL Comments: Pt limited by SOB, but able to perform own ADL tasks sitting and standing with increased time for rest breaks. Pt performing ambulation tasks with SPC ~145* feet with modified independence. SpO2 >90% O2 on RA.     Vision Baseline Vision/History: 0 No visual deficits Ability to See in Adequate Light: 0 Adequate Patient Visual Report: No change from baseline Vision Assessment?: No apparent visual deficits     Perception     Praxis      Pertinent Vitals/Pain Pain Assessment Pain Assessment: 0-10 Pain Score: 5  Pain Location: "all over" Pain Descriptors / Indicators: Discomfort Pain  Intervention(s): Monitored during session     Hand Dominance Right   Extremity/Trunk Assessment Upper Extremity Assessment Upper Extremity Assessment: Overall WFL for tasks assessed   Lower Extremity Assessment Lower Extremity Assessment: Overall WFL for tasks assessed   Cervical / Trunk Assessment Cervical / Trunk Assessment: Other exceptions Cervical / Trunk Exceptions: large body habitus   Communication Communication Communication: No difficulties   Cognition Arousal/Alertness: Awake/alert Behavior During Therapy: WFL for tasks assessed/performed Overall Cognitive Status: Within Functional Limits for tasks assessed                                       General Comments  O2 >90% on RA; HR 80-90s with exertion.     Exercises     Shoulder Instructions      Home Living Family/patient expects to be discharged to:: Private residence Living Arrangements: Spouse/significant other Available Help at Discharge: Family;Available PRN/intermittently Type of Home: House Home Access: Level entry     Home Layout: One level     Bathroom Shower/Tub: Occupational psychologist: Handicapped height     Home Equipment: Shower seat;Cane - single Barista (2 wheels)          Prior Functioning/Environment Prior Level of Function : Independent/Modified Independent             Mobility Comments: use of SPC ADLs Comments: assist with ADL tasks and iADL tasks from aide.        OT Problem List: Decreased activity tolerance      OT Treatment/Interventions:      OT Goals(Current goals can be found in the care plan section) Acute Rehab OT Goals Patient Stated Goal: to go home OT Goal Formulation: All assessment and education complete, DC therapy Potential to Achieve Goals: Good  OT Frequency:      Co-evaluation              AM-PAC OT "6 Clicks" Daily Activity     Outcome Measure Help from another person eating meals?: None Help from another person taking care of personal grooming?: None Help from another person toileting, which includes using toliet, bedpan, or urinal?: None Help from another person bathing (including washing, rinsing, drying)?: None Help from another person to put on and taking off regular upper body clothing?: None Help from another person to put on and taking off regular lower body clothing?: None 6 Click Score: 24   End of Session Equipment Utilized During Treatment: Other (comment) ([place]) Nurse Communication: Mobility status  Activity Tolerance: Patient tolerated treatment well Patient left: in bed;with call bell/phone within reach  OT Visit Diagnosis: Unsteadiness on feet (R26.81)                Time: 6073-7106 OT Time Calculation (min): 25  min Charges:  OT General Charges $OT Visit: 1 Visit OT Evaluation $OT Eval Moderate Complexity: 1 Mod OT Treatments $Self Care/Home Management : 8-22 mins  Jefferey Pica, OTR/L Acute Rehabilitation Services Office: [phone]   Audie Pinto 07/07/2022, 2:44 PM

## 2022-07-07 NOTE — Progress Notes (Signed)
Daily Progress Note Intern Pager: [phone]  Patient name: [name] Medical record number: [id number] Date of birth: 1961-01-16 Age: 61 y.o. Gender: female  Primary Care Provider: Orvis Brill, DO Consultants: Cardiology Code Status: FULL  Pt Overview and Major Events to Date:  -07/04/22 Admission -07/05/22 Cardiology consulted  Assessment and Plan:  ADISSON DEAK is a 61 y.o. female presenting with SOB likely due to CHF exacerbation. Pertinent PMHx includes anemia, CAD, breast cancer, chronic pain, dyslipidemia, gout, HTN, MI, umbilical hernia.   * Acute exacerbation of CHF (congestive heart failure) (HCC) Respiratory status improved to RA with diuresis. - Strict I/O to monitor UOP - ECHO this admission grade II diastolic dysfunction with elevated right side heart pressures - Consulted cardiology, recs appreciated - On RA, spO2 >95% - Continuous cardiac monitoring - Strict Is&Os - Daily weights - PO Lasix '40mg'$  - Coreg - Jardiance '10mg'$  - Potassium 3.3, Mg 1.8, replete  Cocaine abuse (HCC) History of cocaine use which could contribute to symptoms. - UDS negative - TOC consult  CAD (coronary artery disease) -Continue aspirin -Continue home Imdur  Elevated liver enzymes AST 64, ALT 72.  Likely transaminitis in the setting of gastric congestion due to heart failure. AST 49, ALT 69 07/06/22 - Hepatitis panel negative   Polycythemia Hemoglobin elevated to 18.4.  Likely secondary due to OSA or long-term tobacco use.  Platelets elevated to 541. 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  - Methadone '15mg'$  q8h PRN  Complex atypical endometrial hyperplasia Suspected uterine cancer. Planning for hysterectomy. Is on provera outpatient.  - Continue Provera - Monitor CBC  Tobacco abuse Has history of tobacco use. Has been trying to wean down; currently takes 4  days to smoke one pack. - F/u PCP to encourage continued cessation efforts - Nicotine patch to be ordered upon patient request       FEN/GI: Heart healthy PPx: Lovenox Dispo:Home with home health tomorrow.  Subjective:  Potassium repleted last night.  Reports having headache that is improved with Tylenol for past 24 hours.  Denies blurry vision, did feel dizzy with low blood pressures last evening and some chest pain that resolved.  No shortness of breath.   Objective: Temp:  [98.2 F (36.8 C)-98.3 F (36.8 C)] 98.2 F (36.8 C) (07/19 0429) Pulse Rate:  [70-81] 81 (07/19 0832) Resp:  [16-20] 20 (07/19 0832) BP: (92-113)/(49-71) 112/60 (07/19 0832) SpO2:  [93 %-98 %] 97 % (07/19 0832) Weight:  [111.6 kg] 111.6 kg (07/19 0100) Physical Exam: General: Female standing next to hospital bed in no acute distress Cardiovascular: Regular rate and rhythm, no murmurs rubs or gallops Respiratory: No bibasilar crackles, normal work of breathing on room air Abdomen: Not soft, nontender, abdomen (improved from admission), no rebound or guarding Extremities: 1+ pitting edema bilaterally of lower extremities, warm  Laboratory: Most recent CBC Lab Results  Component Value Date   WBC 11.9 (H) 07/07/2022   HGB 14.3 07/07/2022   HCT 42.8 07/07/2022   MCV 84.4 07/07/2022   PLT 433 (H) 07/07/2022   Most recent BMP    Latest Ref Rng & Units 07/07/2022    3:43 AM  BMP  Glucose 70 - 99 mg/dL 117   BUN 6 - 20 mg/dL 27   Creatinine 0.44 - 1.00 mg/dL 1.09   Sodium 135 - 145 mmol/L 140   Potassium 3.5 - 5.1 mmol/L  3.3   Chloride 98 - 111 mmol/L 107   CO2 22 - 32 mmol/L 25   Calcium 8.9 - 10.3 mg/dL 8.1     Other pertinent labs: Mg 1.8   Imaging/Diagnostic Tests: No new imaging.  Salvadore Oxford, MD 07/07/2022, 9:10 AM  PGY-1, [place] Intern pager: [phone], text pages welcome Secure chat group [place]

## 2022-07-07 NOTE — Progress Notes (Signed)
FMTS Interim Progress Note  S:Checked in on patient for reported headache. Patient reports not severe, has resolved with Tylenol. Began the day of admission. Left sided and posterior of her head. Patient reports having had blurry vision in the past but does not have it here in hospital with the headache. Some nausea of first day of headache, but that has resolved with treatment of her heart failure exacerbation.  O: BP 120/84   Pulse 83   Temp 98.2 F (36.8 C) (Oral)   Resp 17   Ht '5\' 5"'$  (1.651 m)   Wt 111.6 kg   LMP 08/07/2011   SpO2 97%   BMI 40.95 kg/m    General: Sitting up on hospital bed eating dinner Neuro: EOM, A&O, moves all extremities appropriately Resp: normal work of breathing on RA   A/P: Headache Likely tension or medication induced headache. -Follow up outpatient. -Continue Tylenol PRN   Salvadore Oxford, MD 07/07/2022, 5:33 PM PGY-1, [place] Service pager [phone]

## 2022-07-07 NOTE — Progress Notes (Signed)
FMTS Interim Progress Note  S:Patient reports intermittent ringing in ears for 1 year with some pain over past 1 year. Has headache that resolves with Tylenol. No fevers.  O: BP 113/66   Pulse 81   Temp 98.2 F (36.8 C) (Oral)   Resp 20   Ht '5\' 5"'$  (1.651 m)   Wt 111.6 kg   LMP 08/07/2011   SpO2 97%   BMI 40.95 kg/m    HEENT: Cerumen in ear bilaterally. Difficult TM visualization.  A/P: Ear ringing Discussed with patient, Tylenol for pain as needed, and best discussed with PCP at follow up visit.  Salvadore Oxford, MD 07/07/2022, 11:54 AM PGY-1, [place] [facility] Service pager [phone]

## 2022-07-08 ENCOUNTER — Inpatient Hospital Stay (HOSPITAL_COMMUNITY): Payer: Medicaid Other

## 2022-07-08 DIAGNOSIS — R519 Headache, unspecified: Secondary | ICD-10-CM

## 2022-07-08 DIAGNOSIS — R079 Chest pain, unspecified: Secondary | ICD-10-CM

## 2022-07-08 DIAGNOSIS — M170 Bilateral primary osteoarthritis of knee: Secondary | ICD-10-CM | POA: Diagnosis not present

## 2022-07-08 DIAGNOSIS — I5033 Acute on chronic diastolic (congestive) heart failure: Secondary | ICD-10-CM | POA: Diagnosis not present

## 2022-07-08 LAB — NM MYOCAR MULTI W/SPECT W/WALL MOTION / EF
Estimated workload: 1
Exercise duration (min): 9 min
Exercise duration (sec): 16 s
MPHR: 70 {beats}/min
Peak HR: 87 {beats}/min
Percent HR: 53 %
Rest HR: 68 {beats}/min
ST Depression (mm): 0 mm

## 2022-07-08 LAB — COMPREHENSIVE METABOLIC PANEL
ALT: 52 U/L — ABNORMAL HIGH (ref 0–44)
AST: 25 U/L (ref 15–41)
Albumin: 2.7 g/dL — ABNORMAL LOW (ref 3.5–5.0)
Alkaline Phosphatase: 81 U/L (ref 38–126)
Anion gap: 7 (ref 5–15)
BUN: 24 mg/dL — ABNORMAL HIGH (ref 6–20)
CO2: 27 mmol/L (ref 22–32)
Calcium: 8.3 mg/dL — ABNORMAL LOW (ref 8.9–10.3)
Chloride: 106 mmol/L (ref 98–111)
Creatinine, Ser: 0.94 mg/dL (ref 0.44–1.00)
GFR, Estimated: >60 mL/min (ref >60)
Glucose, Bld: 132 mg/dL — ABNORMAL HIGH (ref 70–99)
Potassium: 3.2 mmol/L — ABNORMAL LOW (ref 3.5–5.1)
Sodium: 140 mmol/L (ref 135–145)
Total Bilirubin: 0.4 mg/dL (ref 0.3–1.2)
Total Protein: 5.8 g/dL — ABNORMAL LOW (ref 6.5–8.1)

## 2022-07-08 LAB — MAGNESIUM: Magnesium: 2.1 mg/dL (ref 1.7–2.4)

## 2022-07-08 MED ORDER — REGADENOSON 0.4 MG/5ML IV SOLN
INTRAVENOUS | Status: AC
  Administered 2022-07-08: 0.4 mg via INTRAVENOUS
  Filled 2022-07-08: qty 5

## 2022-07-08 MED ORDER — POTASSIUM CHLORIDE CRYS ER 20 MEQ PO TBCR
40.0000 meq | EXTENDED_RELEASE_TABLET | Freq: Once | ORAL | Status: AC
  Administered 2022-07-08: 40 meq via ORAL
  Filled 2022-07-08: qty 2

## 2022-07-08 MED ORDER — POTASSIUM CHLORIDE CRYS ER 20 MEQ PO TBCR
40.0000 meq | EXTENDED_RELEASE_TABLET | Freq: Once | ORAL | Status: AC
Start: 2022-07-08 — End: 2022-07-08
  Administered 2022-07-08: 40 meq via ORAL
  Filled 2022-07-08: qty 2

## 2022-07-08 MED ORDER — REGADENOSON 0.4 MG/5ML IV SOLN
0.4000 mg | Freq: Once | INTRAVENOUS | Status: AC
  Administered 2022-07-08: 0.4 mg via INTRAVENOUS

## 2022-07-08 MED ORDER — NITROGLYCERIN 0.4 MG SL SUBL
SUBLINGUAL_TABLET | SUBLINGUAL | Status: AC
  Administered 2022-07-08: 0.4 mg via SUBLINGUAL
  Filled 2022-07-08: qty 1

## 2022-07-08 MED ORDER — NITROGLYCERIN 0.4 MG SL SUBL
0.4000 mg | SUBLINGUAL_TABLET | SUBLINGUAL | Status: DC | PRN
Start: 2022-07-08 — End: 2022-07-09

## 2022-07-08 MED ORDER — TECHNETIUM TC 99M TETROFOSMIN IV KIT
10.8000 | PACK | Freq: Once | INTRAVENOUS | Status: AC | PRN
  Administered 2022-07-08: 10.8 via INTRAVENOUS

## 2022-07-08 MED ORDER — POTASSIUM CHLORIDE CRYS ER 20 MEQ PO TBCR
40.0000 meq | EXTENDED_RELEASE_TABLET | Freq: Every day | ORAL | Status: DC
  Administered 2022-07-08 – 2022-07-09 (×2): 40 meq via ORAL
  Filled 2022-07-08 (×3): qty 2

## 2022-07-08 MED ORDER — TECHNETIUM TC 99M TETROFOSMIN IV KIT
30.4000 | PACK | Freq: Once | INTRAVENOUS | Status: AC | PRN
Start: 2022-07-08 — End: 2022-07-08
  Administered 2022-07-08: 30.4 via INTRAVENOUS

## 2022-07-08 MED ORDER — FUROSEMIDE 20 MG PO TABS
20.0000 mg | ORAL_TABLET | Freq: Every day | ORAL | Status: DC
  Administered 2022-07-09: 20 mg via ORAL
  Filled 2022-07-08: qty 1

## 2022-07-08 MED ORDER — BACLOFEN 5 MG HALF TABLET: 5.0000 mg | ORAL_TABLET | Freq: Two times a day (BID) | ORAL | Status: DC | PRN

## 2022-07-08 MED ORDER — LIP MEDEX EX OINT
TOPICAL_OINTMENT | CUTANEOUS | Status: DC | PRN
  Filled 2022-07-08: qty 7

## 2022-07-08 NOTE — Progress Notes (Signed)
Spoke with pt re bil mastectomy.  States LUE restricted but not the right.  States lymph nodes removed from LUE and was told no sticks to LUE but ok for RUE.  PIV placed in RAFA.  Pt requested R chest PIV to be removed due to burning.  Site pink and tender.  Pt states was placed in ambulance.

## 2022-07-08 NOTE — Progress Notes (Signed)
NM stress Myoview 07/08/22:  1.Multiple fixed defects consistent with infarction involving the left circumflex coronary artery and right coronary artery.   2. Left ventricular dilatation is identified with decreased thickening and wall motion involving the apical segment of the inferior wall and mid inferior wall.   3. Left ventricular ejection fraction 35%   4. Non invasive risk stratification*: High  Result informed cardiology Dr Margaretann Loveless

## 2022-07-08 NOTE — Progress Notes (Signed)
        [name] presented for a nuclear stress test today.  No immediate complications. She had onset of typical chest pain and SOB 30 second after receiving Lexiscan. BP and HR and Pox were stable. Due to persistent symptoms, SL Nitro 0.'4mg'$  x1 was given at 5 minute. No significant EKG changes noted. Pain and SOB eased around 8-9 minutes.  Stress imaging is pending at this time.  Preliminary EKG findings may be listed in the chart, but the stress test result will not be finalized until perfusion imaging is complete.  [facility] radiology to read.    Margie Billet, AGACNP-BC 07/08/2022, 2:17 PM

## 2022-07-08 NOTE — Evaluation (Signed)
Physical Therapy Evaluation Patient Details Name: [name] MRN: [id number] DOB: 1961/09/24 Today's Date: 07/08/2022  History of Present Illness  61 yo presenting to [facility] hospital on 07/04/2022 with acute hypoxemic respiratory failure 2/2 heart failure. PMHx:  CAD s/p stent in 2008 (BMS to RCA, DES to LAD in '09), tobacco and cocaine use, HTN, and obesity.  Clinical Impression  Pt presents to PT with deficits in activity tolerance and cardiopulmonary function. Pt reports feeling closer to baseline although continues to endorse fatigue and mild DOE. Pt is able to ambulate without assistance when utilizing SPC. Pt expresses no current concerns about mobility at this time. PT will continue to follow during admission in an effort to improve activity tolerance further.       Recommendations for follow up therapy are one component of a multi-disciplinary discharge planning process, led by the attending physician.  Recommendations may be updated based on patient status, additional functional criteria and insurance authorization.  Follow Up Recommendations No PT follow up      Assistance Recommended at Discharge PRN  Patient can return home with the following  Assistance with cooking/housework;Help with stairs or ramp for entrance;Assist for transportation    Equipment Recommendations None recommended by PT  Recommendations for Other Services       Functional Status Assessment Patient has had a recent decline in their functional status and demonstrates the ability to make significant improvements in function in a reasonable and predictable amount of time.     Precautions / Restrictions Precautions Precautions: None Restrictions Weight Bearing Restrictions: No      Mobility  Bed Mobility Overal bed mobility: Independent                  Transfers Overall transfer level: Modified independent Equipment used: Straight cane                     Ambulation/Gait Ambulation/Gait assistance: Modified independent (Device/Increase time) Gait Distance (Feet): 150 Feet Assistive device: Straight cane Gait Pattern/deviations: Step-through pattern Gait velocity: reduced Gait velocity interpretation: <1.8 ft/sec, indicate of risk for recurrent falls   General Gait Details: pt with slowed step-through gait, PRN use of railing in addition to cane, no LOB noted  Stairs            Wheelchair Mobility    Modified Rankin (Stroke Patients Only)       Balance Overall balance assessment: Needs assistance Sitting-balance support: No upper extremity supported, Feet supported Sitting balance-Leahy Scale: Good     Standing balance support: Single extremity supported, Reliant on assistive device for balance Standing balance-Leahy Scale: Poor                               Pertinent Vitals/Pain Pain Assessment Pain Assessment: No/denies pain    Home Living Family/patient expects to be discharged to:: Private residence Living Arrangements: Spouse/significant other Available Help at Discharge: Family;Available PRN/intermittently Type of Home: House Home Access: Level entry       Home Layout: One level Home Equipment: Shower seat;Cane - single Barista (2 wheels)      Prior Function Prior Level of Function : Independent/Modified Independent             Mobility Comments: ambulates for household distances with use of cane ADLs Comments: assist with ADL tasks and iADL tasks from aide.     Hand Dominance   Dominant Hand: Right  Extremity/Trunk Assessment   Upper Extremity Assessment Upper Extremity Assessment: Overall WFL for tasks assessed    Lower Extremity Assessment Lower Extremity Assessment: Overall WFL for tasks assessed    Cervical / Trunk Assessment Cervical / Trunk Assessment: Normal  Communication   Communication: No difficulties  Cognition Arousal/Alertness:  Awake/alert Behavior During Therapy: WFL for tasks assessed/performed Overall Cognitive Status: Within Functional Limits for tasks assessed                                          General Comments General comments (skin integrity, edema, etc.): VSS on RA, pt reports mild SOB although states this is close to her baseline with mobility    Exercises     Assessment/Plan    PT Assessment Patient needs continued PT services  PT Problem List Decreased activity tolerance;Cardiopulmonary status limiting activity       PT Treatment Interventions Gait training;Therapeutic activities;Therapeutic exercise;Balance training;Patient/family education    PT Goals (Current goals can be found in the Care Plan section)  Acute Rehab PT Goals Patient Stated Goal: to go home PT Goal Formulation: With patient Time For Goal Achievement: 07/22/22 Potential to Achieve Goals: Good Additional Goals Additional Goal #1: Pt will report 0/4 DOE when ambulating for >150' to demonstrate improved tolerance for mobility    Frequency Min 2X/week     Co-evaluation               AM-PAC PT "6 Clicks" Mobility  Outcome Measure Help needed turning from your back to your side while in a flat bed without using bedrails?: None Help needed moving from lying on your back to sitting on the side of a flat bed without using bedrails?: None Help needed moving to and from a bed to a chair (including a wheelchair)?: None Help needed standing up from a chair using your arms (e.g., wheelchair or bedside chair)?: None Help needed to walk in hospital room?: None Help needed climbing 3-5 steps with a railing? : A Little 6 Click Score: 23    End of Session   Activity Tolerance: Patient tolerated treatment well Patient left: in bed;with call bell/phone within reach Nurse Communication: Mobility status PT Visit Diagnosis: Other abnormalities of gait and mobility (R26.89)    Time: 0921-0929 PT Time  Calculation (min) (ACUTE ONLY): 8 min   Charges:   PT Evaluation $PT Eval Low Complexity: Isabela, PT, DPT Acute Rehabilitation Office [phone]   Zenaida Niece 07/08/2022, 9:38 AM

## 2022-07-08 NOTE — Progress Notes (Signed)
Daily Progress Note Intern Pager: [phone]  Patient name: [name] Medical record number: [id number] Date of birth: 04/04/1961 Age: 61 y.o. Gender: female  Primary Care Provider: Orvis Brill, DO Consultants: Cardiology Code Status: Full  Pt Overview and Major Events to Date:  -07/04/22 Admission -07/05/22 Cardiology consulted  Assessment and Plan: [name] is a 61 y.o. female presenting with SOB likely due to CHF exacerbation. Pertinent PMHx includes anemia, CAD, breast cancer, chronic pain, dyslipidemia, gout, HTN, MI, umbilical hernia.  * Acute exacerbation of CHF (congestive heart failure) (HCC) Respiratory status improved to RA with diuresis. - Strict I/O to monitor UOP - ECHO this admission grade II diastolic dysfunction with elevated right side heart pressures - Consulted cardiology, recs appreciated, recommending ischemic eval in the hospital - On RA, spO2 >95% - Continuous cardiac monitoring - Strict Is&Os - Daily weights - PO Lasix '40mg'$  - Coreg - Jardiance '10mg'$  Daily potassium  Cocaine abuse (HCC) History of cocaine use which could contribute to symptoms. - UDS negative - TOC consult  CAD (coronary artery disease) -Continue aspirin -Continue home Imdur  Headache Began day of admission.  Intermittent resolved with Tylenol.  Left-sided posterior.  Likely tension. -K-pad - Baclofen as needed   Elevated liver enzymes AST 64, ALT 72.  Likely transaminitis in the setting of gastric congestion due to heart failure. AST 39, ALT 69 07/07/22 - Hepatitis panel negative   Polycythemia Hemoglobin elevated to 18.4.  Likely secondary due to OSA or long-term tobacco use.  Platelets elevated to 541. - Peripheral blood smear - Giant platelets  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[place]) Per prior notes, is on for chronic pain syndrome.  - Methadone '15mg'$  q8h PRN  Complex atypical endometrial hyperplasia Suspected uterine cancer. Planning for hysterectomy. Is on  provera outpatient.  - Continue Provera - Monitor CBC  Tobacco abuse Has history of tobacco use. Has been trying to wean down; currently takes 4 days to smoke one pack. - F/u PCP to encourage continued cessation efforts - Nicotine patch to be ordered upon patient request     FEN/GI: Heart healthy PPx: Lovenox Dispo:Home with home health pending ischemic eval  Subjective:  No acute events overnight.  Still has headache.  Still relieved with Tylenol.  Some chest pain with exertion, not in pain right now.  No nausea or vomiting, no shortness of breath.  Objective: Temp:  [98.2 F (36.8 C)-98.4 F (36.9 C)] 98.3 F (36.8 C) (07/20 1107) Pulse Rate:  [69-83] 70 (07/20 1107) Resp:  [15-22] 15 (07/20 1107) BP: (110-128)/(66-84) 110/73 (07/20 1107) SpO2:  [95 %-98 %] 95 % (07/20 1107) Weight:  [111.2 kg] 111.2 kg (07/20 0505) Physical Exam: General: Well-appearing female sitting in hospital bed Cardiovascular: Regular rate and rhythm no murmurs rubs or gallops Respiratory: No bibasilar crackles normal work of breathing on room air Abdomen: Mildly distended, nontender diffusely Extremities: 1+ pitting edema up to mid shin bilateral lower extremities  Laboratory: Most recent CBC Lab Results  Component Value Date   WBC 11.9 (H) 07/07/2022   HGB 14.3 07/07/2022   HCT 42.8 07/07/2022   MCV 84.4 07/07/2022   PLT 433 (H) 07/07/2022   Most recent BMP    Latest Ref Rng & Units 07/08/2022    2:46 AM  BMP  Glucose 70 - 99 mg/dL 132   BUN 6 - 20 mg/dL 24   Creatinine 0.44 - 1.00 mg/dL 0.94   Sodium 135 - 145 mmol/L 140   Potassium 3.5 -  5.1 mmol/L 3.2   Chloride 98 - 111 mmol/L 106   CO2 22 - 32 mmol/L 27   Calcium 8.9 - 10.3 mg/dL 8.3     Other pertinent labs none  Imaging/Diagnostic Tests: No new imaging Salvadore Oxford, MD 07/08/2022, 12:43 PM  PGY-1, [place] Intern pager: [phone], text pages welcome Secure chat group [place]

## 2022-07-08 NOTE — Progress Notes (Signed)
Progress Note  Patient Name: [name] Date of Encounter: 07/08/2022  [facility] Cardiologist: Sinclair Grooms, MD   Subjective   Headache. Also has exertional chest pain.   Inpatient Medications    Scheduled Meds:  allopurinol  300 mg Oral Daily   aspirin EC  81 mg Oral Daily   atorvastatin  40 mg Oral Daily   carvedilol  25 mg Oral BID WC   cholecalciferol  1,000 Units Oral Daily   empagliflozin  10 mg Oral Daily   enoxaparin (LOVENOX) injection  40 mg Subcutaneous Q24H   fluticasone  1 spray Each Nare Daily   furosemide  40 mg Oral Daily   isosorbide mononitrate  60 mg Oral QHS   medroxyPROGESTERone  20 mg Oral Daily   Continuous Infusions:  PRN Meds: acetaminophen, methadone   Vital Signs    Vitals:   07/07/22 2006 07/08/22 0505 07/08/22 0756 07/08/22 0800  BP: 120/81 116/66 128/77 128/77  Pulse: 69 73 76 75  Resp: '20 16 20 '$ (!) 22  Temp: 98.2 F (36.8 C) 98.3 F (36.8 C) 98.4 F (36.9 C)   TempSrc: Oral Oral Oral   SpO2: 96% 96% 98% 96%  Weight:  111.2 kg    Height:        Intake/Output Summary (Last 24 hours) at 07/08/2022 0951 Last data filed at 07/08/2022 0757 Gross per 24 hour  Intake 1130 ml  Output 3450 ml  Net -2320 ml      07/08/2022    5:05 AM 07/07/2022    1:00 AM 07/06/2022    3:55 AM  Last 3 Weights  Weight (lbs) 245 lb 3.2 oz 246 lb 1.6 oz 243 lb 6.4 oz  Weight (kg) 111.222 kg 111.63 kg 110.406 kg      Telemetry    SR, ventricular bigeminy- Personally Reviewed  ECG    No new - Personally Reviewed  Physical Exam   GEN: No acute distress.   Neck: No JVD Cardiac: RRR, no murmurs, rubs, or gallops.  Respiratory: Clear to auscultation bilaterally. GI: Soft, nontender, non-distended  MS: mild bilateral edema; No deformity. Neuro:  Nonfocal  Psych: Normal affect   Labs    High Sensitivity Troponin:   Recent Labs  Lab 07/04/22 2038 07/04/22 2325 07/05/22 0609  TROPONINIHS 19* 64* 45*     Chemistry Recent  Labs  Lab 07/06/22 0423 07/06/22 1356 07/07/22 0343 07/08/22 0246  NA 138 142 140 140  K 3.1* 3.4* 3.3* 3.2*  CL 108 108 107 106  CO2 '23 22 25 27  '$ GLUCOSE 122* 158* 117* 132*  BUN 26* 26* 27* 24*  CREATININE 1.24* 1.33* 1.09* 0.94  CALCIUM 8.3* 7.9* 8.1* 8.3*  MG 1.8  --  1.8 2.1  PROT 6.5  --  6.0* 5.8*  ALBUMIN 3.2*  --  2.6* 2.7*  AST 49*  --  39 25  ALT 69*  --  69* 52*  ALKPHOS 106  --  91 81  BILITOT 0.6  --  0.3 0.4  GFRNONAA 50* 46* 59* >60  ANIONGAP '7 12 8 7    '$ Lipids No results for input(s): "CHOL", "TRIG", "HDL", "LABVLDL", "LDLCALC", "CHOLHDL" in the last 168 hours.  Hematology Recent Labs  Lab 07/04/22 2038 07/04/22 2047 07/07/22 0343  WBC 10.4  --  11.9*  RBC 6.01*  --  5.07  HGB 16.8* 18.0*  18.4* 14.3  HCT 54.4* 53.0*  54.0* 42.8  MCV 90.5  --  84.4  MCH 28.0  --  28.2  MCHC 30.9  --  33.4  RDW 16.2*  --  15.4  PLT 541*  --  433*   Thyroid No results for input(s): "TSH", "FREET4" in the last 168 hours.  BNP Recent Labs  Lab 07/04/22 2038  BNP 915.1*    DDimer No results for input(s): "DDIMER" in the last 168 hours.   Radiology    No results found.  Cardiac Studies   Echo: IMPRESSIONS     1. Left ventricular ejection fraction, by estimation, is 55 to 60%. The  left ventricle has normal function. The left ventricle has no regional  wall motion abnormalities. There is mild left ventricular hypertrophy.  Left ventricular diastolic parameters  are consistent with Grade II diastolic dysfunction (pseudonormalization). Personally interpreted as indeterminate  2. Right ventricular systolic function is normal. The right ventricular  size is normal. There is moderately elevated pulmonary artery systolic  pressure. The estimated right ventricular systolic pressure is 95.2 mmHg.   3. Left atrial size was mildly dilated.   4. Right atrial size was mildly dilated.   5. The mitral valve is normal in structure. Trivial mitral valve   regurgitation. No evidence of mitral stenosis.   6. The aortic valve is tricuspid. Aortic valve regurgitation is not  visualized. No aortic stenosis is present.   7. The inferior vena cava is dilated in size with <50% respiratory  variability, suggesting right atrial pressure of 15 mmHg.   Patient Profile     61 y.o. female with CAD with MI 2008 s/p BMS to RCA, DES to LAD 2009, cath, HTN, HLD, pre-DM, obesity, tobacco abuse, prior cocaine use, aortic atherosclerosis, breast CA s/p bilateral mastectomy, suspected uterine CA with plan for hysterectomy. Last cath 2014 with patent stents and otherwise nonobstructive disease. She had a nuc stress for surgical clearance in 2021 that was abnormal with small reversible defect in the apex region as well as fixed defect in the basal/inferior location c/w prior infarct - findings c/w ischemia and prior MI, though small amount of ischemia - Richardson Dopp had reviewed with Dr. Tamala Julian and felt unchanged from prior so medical therapy recommended. Was admitted 03/2022 with chest pain and negative troponins but + cocaine. Echo showed EF 60-65%, g1DD, mild LAE, trivial pericardial effusion. Surgical clearance for hysterectomy also requested at that time though Dr. Margaretann Loveless commented that she frankly could not provide cardiac risk stratification in setting of positive UDS, and OP follow-up was recommended. She no-showed to her 04/2022 and 05/2022 cardiology visits.   Presented this admission with respiratory distress and hypoxia requiring CPAP. RVP, UDS neg. CXR showed pulmonary edema. Admitted for diastolic CHF.  Assessment & Plan    Principal Problem:   Acute exacerbation of CHF (congestive heart failure) (HCC) Active Problems:   Tobacco abuse   CAD (coronary artery disease)   Complex atypical endometrial hyperplasia   Cocaine abuse ([place])   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[place])   Polycythemia   Elevated liver enzymes  1. Acute hypoxic respiratory failure, acute diastolic  CHF 2. Chest pain 3. CAD as above, with mildly elevated troponin - 2D echo showed EF 55-60%, mild LVH, G2DD, normal RV, moderately elevated PASP, mild BAE, dilated IVC -> consistent with diastolic CHF - CHF exacerbation may be secondary to untreated OSA, cannot exclude ischemia with chest pain walking to bathroom - diuresing well on po lasix, continue.  - continue carvedilol (transitioned from home metoprolol), Jardiance (new), Imdur (home med), Lasix (new) - DOE  and chest pain with exertion. We need an ischemic evaluation. Cannot have CCTA due to prior stents. Nuclear stress test with lexiscan to be performed today.    4. Tobacco abuse, prior h/o cocaine use, methadone use - tobacco felt to be contributing to breathing issues, cessation encouraged - UDS neg   5.  CV risk stratification needed for hysterectomy - per GYN notes, missed several appts to get this arranged as well - needs ischemic evaluation, will perform today.   6. 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 Essential HTN - manage in context above, normotensive today.   Other issues per IM - elevated LFTs, hypoalbuminemia, hypokalemia, relative hypomagnesemia, polycythemia -> internal medicine team managing electrolytes   Have tentatively rescheduled f/u to 7/31, appt info placed on AVS, but not yet ready for DC.      For questions or updates, please contact [place] Please consult [email or web address] for contact info under        Signed, Elouise Munroe, MD  07/08/2022, 9:51 AM

## 2022-07-08 NOTE — Assessment & Plan Note (Signed)
Began day of admission.  Intermittent resolved with Tylenol.  Left-sided posterior.  Likely tension. -K-pad - Baclofen as needed

## 2022-07-09 ENCOUNTER — Other Ambulatory Visit (HOSPITAL_COMMUNITY): Payer: Self-pay

## 2022-07-09 DIAGNOSIS — M170 Bilateral primary osteoarthritis of knee: Secondary | ICD-10-CM | POA: Diagnosis not present

## 2022-07-09 DIAGNOSIS — I5033 Acute on chronic diastolic (congestive) heart failure: Secondary | ICD-10-CM | POA: Diagnosis not present

## 2022-07-09 LAB — BASIC METABOLIC PANEL
Anion gap: 7 (ref 5–15)
BUN: 19 mg/dL (ref 6–20)
CO2: 26 mmol/L (ref 22–32)
Calcium: 8.2 mg/dL — ABNORMAL LOW (ref 8.9–10.3)
Chloride: 108 mmol/L (ref 98–111)
Creatinine, Ser: 0.9 mg/dL (ref 0.44–1.00)
GFR, Estimated: >60 mL/min (ref >60)
Glucose, Bld: 100 mg/dL — ABNORMAL HIGH (ref 70–99)
Potassium: 3.4 mmol/L — ABNORMAL LOW (ref 3.5–5.1)
Sodium: 141 mmol/L (ref 135–145)

## 2022-07-09 MED ORDER — EMPAGLIFLOZIN 10 MG PO TABS
10.0000 mg | ORAL_TABLET | Freq: Every day | ORAL | 1 refills | Status: DC
  Filled 2022-07-09 – 2022-08-19 (×2): qty 30, 30d supply, fill #0

## 2022-07-09 MED ORDER — CARVEDILOL 25 MG PO TABS
25.0000 mg | ORAL_TABLET | Freq: Two times a day (BID) | ORAL | 1 refills | Status: DC
  Filled 2022-07-09 – 2022-08-19 (×2): qty 60, 30d supply, fill #0

## 2022-07-09 MED ORDER — ACETAMINOPHEN 325 MG PO TABS: 650.0000 mg | ORAL_TABLET | Freq: Four times a day (QID) | ORAL | Status: DC | PRN

## 2022-07-09 MED ORDER — EZETIMIBE 10 MG PO TABS
10.0000 mg | ORAL_TABLET | Freq: Every day | ORAL | 1 refills | Status: DC
  Filled 2022-07-09 – 2022-08-19 (×2): qty 30, 30d supply, fill #0

## 2022-07-09 MED ORDER — FUROSEMIDE 20 MG PO TABS
20.0000 mg | ORAL_TABLET | Freq: Every day | ORAL | 0 refills | Status: DC
Start: 2022-07-10 — End: 2022-09-03
  Filled 2022-07-09: qty 60, 60d supply, fill #0

## 2022-07-09 MED ORDER — POTASSIUM CHLORIDE CRYS ER 20 MEQ PO TBCR: 20.0000 meq | EXTENDED_RELEASE_TABLET | Freq: Three times a day (TID) | ORAL | Status: DC

## 2022-07-09 MED ORDER — FLUTICASONE PROPIONATE 50 MCG/ACT NA SUSP
1.0000 | Freq: Every day | NASAL | 2 refills | Status: DC
  Filled 2022-07-09 – 2022-08-19 (×2): qty 16, 30d supply, fill #0

## 2022-07-09 MED ORDER — EZETIMIBE 10 MG PO TABS
10.0000 mg | ORAL_TABLET | Freq: Every day | ORAL | Status: DC
  Administered 2022-07-09: 10 mg via ORAL
  Filled 2022-07-09: qty 1

## 2022-07-09 MED ORDER — BACLOFEN 5 MG PO TABS
5.0000 mg | ORAL_TABLET | Freq: Two times a day (BID) | ORAL | 0 refills | Status: DC | PRN
  Filled 2022-07-09: qty 10, 5d supply, fill #0

## 2022-07-09 NOTE — Progress Notes (Addendum)
Progress Note  Patient Name: [name] Date of Encounter: 07/09/2022  [facility] Cardiologist: Sinclair Grooms, MD   Subjective   Denies any CP or significant SOB  Inpatient Medications    Scheduled Meds:  allopurinol  300 mg Oral Daily   aspirin EC  81 mg Oral Daily   atorvastatin  40 mg Oral Daily   carvedilol  25 mg Oral BID WC   cholecalciferol  1,000 Units Oral Daily   empagliflozin  10 mg Oral Daily   enoxaparin (LOVENOX) injection  40 mg Subcutaneous Q24H   fluticasone  1 spray Each Nare Daily   furosemide  20 mg Oral Daily   isosorbide mononitrate  60 mg Oral QHS   medroxyPROGESTERone  20 mg Oral Daily   potassium chloride  40 mEq Oral Daily   Continuous Infusions:  PRN Meds: acetaminophen, baclofen, lip balm, methadone, nitroGLYCERIN   Vital Signs    Vitals:   07/08/22 1432 07/08/22 1609 07/08/22 2004 07/09/22 0612  BP: 109/79 (!) 134/95 118/69 114/81  Pulse:  75 76 78  Resp: '18 16 15 19  '$ Temp:  98.2 F (36.8 C) 98.3 F (36.8 C) 98.5 F (36.9 C)  TempSrc:  Oral Oral Oral  SpO2: 95% 94% 95% 95%  Weight:    110.6 kg  Height:        Intake/Output Summary (Last 24 hours) at 07/09/2022 1044 Last data filed at 07/09/2022 8144 Gross per 24 hour  Intake 694 ml  Output 2550 ml  Net -1856 ml      07/09/2022    6:12 AM 07/08/2022    5:05 AM 07/07/2022    1:00 AM  Last 3 Weights  Weight (lbs) 243 lb 14.4 oz 245 lb 3.2 oz 246 lb 1.6 oz  Weight (kg) 110.632 kg 111.222 kg 111.63 kg      Telemetry    NSR without significant ventricular ectopy, single episode of 16 beats run of NSVT around 11AM yesterday - Personally Reviewed  ECG    NSR without significant ST-T wave changes - Personally Reviewed  Physical Exam   GEN: No acute distress.   Neck: No JVD Cardiac: RRR, no murmurs, rubs, or gallops.  Respiratory: Clear to auscultation bilaterally. GI: Soft, nontender, non-distended  MS: No edema; No deformity. Neuro:  Nonfocal  Psych:  Normal affect   Labs    High Sensitivity Troponin:   Recent Labs  Lab 07/04/22 2038 07/04/22 2325 07/05/22 0609  TROPONINIHS 19* 64* 45*     Chemistry Recent Labs  Lab 07/06/22 0423 07/06/22 1356 07/07/22 0343 07/08/22 0246 07/09/22 0336  NA 138   < > 140 140 141  K 3.1*   < > 3.3* 3.2* 3.4*  CL 108   < > 107 106 108  CO2 23   < > '25 27 26  '$ GLUCOSE 122*   < > 117* 132* 100*  BUN 26*   < > 27* 24* 19  CREATININE 1.24*   < > 1.09* 0.94 0.90  CALCIUM 8.3*   < > 8.1* 8.3* 8.2*  MG 1.8  --  1.8 2.1  --   PROT 6.5  --  6.0* 5.8*  --   ALBUMIN 3.2*  --  2.6* 2.7*  --   AST 49*  --  39 25  --   ALT 69*  --  69* 52*  --   ALKPHOS 106  --  91 81  --   BILITOT 0.6  --  0.3 0.4  --   GFRNONAA 50*   < > 59* >60 >60  ANIONGAP 7   < > '8 7 7   '$ < > = values in this interval not displayed.    Lipids No results for input(s): "CHOL", "TRIG", "HDL", "LABVLDL", "LDLCALC", "CHOLHDL" in the last 168 hours.  Hematology Recent Labs  Lab 07/04/22 2038 07/04/22 2047 07/07/22 0343  WBC 10.4  --  11.9*  RBC 6.01*  --  5.07  HGB 16.8* 18.0*  18.4* 14.3  HCT 54.4* 53.0*  54.0* 42.8  MCV 90.5  --  84.4  MCH 28.0  --  28.2  MCHC 30.9  --  33.4  RDW 16.2*  --  15.4  PLT 541*  --  433*   Thyroid No results for input(s): "TSH", "FREET4" in the last 168 hours.  BNP Recent Labs  Lab 07/04/22 2038  BNP 915.1*    DDimer No results for input(s): "DDIMER" in the last 168 hours.   Radiology    NM Myocar Multi W/Spect W/Wall Motion / EF  Result Date: 07/08/2022 CLINICAL DATA:  Chest pain EXAM: MYOCARDIAL IMAGING WITH SPECT (REST AND PHARMACOLOGIC-STRESS) GATED LEFT VENTRICULAR WALL MOTION STUDY LEFT VENTRICULAR EJECTION FRACTION TECHNIQUE: Standard myocardial SPECT imaging was performed after resting intravenous injection of 10.8 mCi Tc-16mtetrofosmin. Subsequently, intravenous infusion of Lexiscan was performed under the supervision of the Cardiology staff. At peak effect of the drug,  30.4 mCi Tc-947metrofosmin was injected intravenously and standard myocardial SPECT imaging was performed. Quantitative gated imaging was also performed to evaluate left ventricular wall motion, and estimate left ventricular ejection fraction. COMPARISON:  None Available. FINDINGS: Perfusion: Small in size, mild in severity, fixed defect involves the mid inferolateral and mid anterolateral segments. Medium in size, severe, fixed defect involving the basal inferior, mid inferior and apical inferior segments. Small in size, mild in severity fixed defect is noted involving the mid anterior segment. Wall Motion: Moderate to severe left ventricular dilatation. Severely decreased thickening and wall motion involving the apical segment of the inferior wall. Mildly decreased wall motion and thickening of the mid inferior segments of the inferior wall noted. Left Ventricular Ejection Fraction: 35 % End diastolic volume 12235l End systolic volume 79 ml IMPRESSION: 1. Multiple fixed defects consistent with infarction involving the left circumflex coronary artery and right coronary artery. 2. Left ventricular dilatation is identified with decreased thickening and wall motion involving the apical segment of the inferior wall and mid inferior wall. 3. Left ventricular ejection fraction 35% 4. Non invasive risk stratification*: High *2012 Appropriate Use Criteria for Coronary Revascularization Focused Update: J Am Coll Cardiol. 205732;20(2):542-706http://content.onairportbarriers.comspx?articleid=[email or web address] Electronically Signed   By: TaKerby Moors.D.   On: 07/08/2022 15:28    Cardiac Studies   Echo 07/05/2022  1. Left ventricular ejection fraction, by estimation, is 55 to 60%. The  left ventricle has normal function. The left ventricle has no regional  wall motion abnormalities. There is mild left ventricular hypertrophy.  Left ventricular diastolic parameters  are consistent with Grade II diastolic dysfunction  (pseudonormalization).   2. Right ventricular systolic function is normal. The right ventricular  size is normal. There is moderately elevated pulmonary artery systolic  pressure. The estimated right ventricular systolic pressure is 5223.7mHg.   3. Left atrial size was mildly dilated.   4. Right atrial size was mildly dilated.   5. The mitral valve is normal in structure. Trivial mitral valve  regurgitation. No evidence of mitral stenosis.  6. The aortic valve is tricuspid. Aortic valve regurgitation is not  visualized. No aortic stenosis is present.   7. The inferior vena cava is dilated in size with <50% respiratory  variability, suggesting right atrial pressure of 15 mmHg.   Comparison(s): Prior images reviewed side by side.   Conclusion(s)/Recommendation(s): Grade 2 diastolic dysfunction and  elevated right sided pressures, consistent with diastolic heart failure.   Patient Profile     61 y.o. female with PMH of CAD s/p BMES to RCA and DES to LAD 2009, HTN, HLD, prediabetes, obesity, tobacco abuse, prior cocaine use, breath CA s/p bilateral mastectomy, suspected uterine CA with plan for hysterectomy. Last cath 2014 with patent stents and otherwise nonobstructive disease. She had a nuc stress for surgical clearance in 2021 that was abnormal with small reversible defect in the apex region as well as fixed defect in the basal/inferior location c/w prior infarct - findings c/w ischemia and prior MI, though small amount of ischemia - Richardson Dopp had reviewed with Dr. Tamala Julian and felt unchanged from prior so medical therapy recommended. Was admitted 03/2022 with chest pain and negative troponins but + cocaine. Echo showed EF 60-65%, g1DD, mild LAE, trivial pericardial effusion. Surgical clearance for hysterectomy also requested at that time though Dr. Margaretann Loveless commented that she frankly could not provide cardiac risk stratification in setting of positive UDS, and OP follow-up was recommended. She  no-showed to her 04/2022 and 05/2022 cardiology visits.   Presented this admission with respiratory distress and hypoxia requiring CPAP. RVP, UDS neg. CXR showed pulmonary edema. Admitted for diastolic CHF.  Assessment & Plan    Acute diastolic CHF  - Echo 6/38/7564 EF 55-60%, mild LVH, G2DD, normal RV, moderately elevated PASP, dilated IVC  - continue coreg, jardiance, Imdur and lasix   CAD with chest pain - myoview 12/24/2019 EF 48%, small reversible defect of mild severity present in the apex location consistent with ischemia, small fixed defect of mild severity present in the basal inferior and mid inferior location consistent with prior MI  - myoview obtained on 07/08/2022 multiple fixed defect consistent with infarction involve LCx and RCA, EF 35%. MD to review and compare with previous myoview in 2021. If no significant ischemia or changes on myoview, suspect potential discharge today if able to ambulate without issue.   - discussed with clinical pharmacist, will add Zetia given elevated LDL, however unclear why her LDL increased so much when compare to Jan 2022 lab  Tobacco abuse  Preoperative clearance prior to hysterectomy: multiple fixed defect seen on nuclear stress test, but no mention of significant reversible ischemia. Will discuss with MD  CKD  Hypokalemia: K has been low despite repletion. Mag normal. Daily dosing of KCl supplement. Repeat BMET in 1 week.  HTN  Cocaine abuse  Methadone dependence   For questions or updates, please contact [place] Please consult [email or web address] for contact info under        Signed, Almyra Deforest, [place]  07/09/2022, 10:44 AM    Patient seen and examined with Almyra Deforest, PA.  Agree as above, with the following exceptions and changes as noted below.  No chest pain or shortness of breath. Gen: NAD, CV: RRR, no murmurs, Lungs: clear, Abd: soft, Extrem: Warm, well perfused, no edema, Neuro/Psych: alert and oriented x 3, normal mood and affect.  All available labs, radiology testing, previous records reviewed.  I have reviewed stress test from 2021 in comparison to 2023.  Images from 2021 exam are of superior  quality.  There is a similar appearing perfusion defect in the inferior wall, it appears more prominent on exam from 2023 however that is accounting for differences in technique.  In addition, images from current nuclear stress test show no ischemia and ejection fraction is likely erroneous in the setting of a recently normal echocardiogram.  Findings overall are intermediate risk, but with no ischemia, chest pain can likely be medically managed.  She tells me she has decided to stop using cocaine.  The patient was admitted with heart failure and cannot at this time be given cardiovascular optimization.  I strongly recommend she follow-up in the office as an outpatient.  It is helpful that she has had a nuclear stress test without ischemia.  At her next outpatient cardiology visit which I have urged her to attend, final preoperative risk comments can be provided if she does not have recurrent heart failure at that time.  It is imperative she follow-up with OB/GYN given concerns of uterine cancer.  She is diuresing appropriately on oral Lasix this can be continued.  Elouise Munroe, MD 07/09/22 12:56 PM

## 2022-07-09 NOTE — Progress Notes (Signed)
Daily Progress Note Intern Pager: [phone]  Patient name: [name] Medical record number: [id number] Date of birth: 1961/08/03 Age: 61 y.o. Gender: female  Primary Care Provider: Orvis Brill, DO Consultants: Cardiology Code Status: Full  Pt Overview and Major Events to Date:  -07/04/22 Admission -07/05/22 Cardiology consulted -07/08/22 NM stress test  Assessment and Plan:  [name] is a 61 y.o. female presenting with SOB likely due to CHF exacerbation. Pertinent PMHx includes anemia, CAD, breast cancer, chronic pain, dyslipidemia, gout, HTN, MI, umbilical hernia.  * Acute exacerbation of CHF (congestive heart failure) (HCC) Respiratory status improved to RA with diuresis. - Strict I/O to monitor UOP - NM stress test positive - Consulted cardiology, recs appreciated - On RA, spO2 >95% - Continuous cardiac monitoring - Strict Is&Os - Daily weights - Return to home 30mq K TID - Coreg - Jardiance '10mg'$   Cocaine abuse (HCC) History of cocaine use which could contribute to symptoms. - UDS negative - TOC consult  CAD (coronary artery disease) -Continue aspirin -Continue home Imdur  Headache Began day of admission.  Intermittent resolved with Tylenol.  Left-sided posterior.  Likely tension. -K-pad - Baclofen as needed   Elevated liver enzymes AST 64, ALT 72.  Likely transaminitis in the setting of gastric congestion due to heart failure. AST 39, ALT 69 07/07/22 - Hepatitis panel negative   Polycythemia Hemoglobin elevated to 18.4.  Likely secondary due to OSA or long-term tobacco use.  Platelets elevated to 541. - Peripheral blood smear - Giant platelets  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[place] Per prior notes, is on for chronic pain syndrome.  - Methadone '15mg'$  q8h PRN  Complex atypical endometrial hyperplasia Suspected uterine cancer. Planning for hysterectomy. Is on provera outpatient.  - Continue Provera - Monitor CBC  Tobacco abuse Has history of  tobacco use. Has been trying to wean down; currently takes 4 days to smoke one pack. - F/u PCP to encourage continued cessation efforts - Nicotine patch to be ordered upon patient request       FEN/GI: Heart healthy PPx: Lovenox Dispo:Home pending cardiology recs  Subjective:  No acute events overnight.  No shortness of breath, chest pain is improved.  Patient wants to leave the hospital, discussed leaving is dependent on what cardiology recommendations.  Objective: Temp:  [98.2 F (36.8 C)-98.5 F (36.9 C)] 98.5 F (36.9 C) (07/21 0612) Pulse Rate:  [75-78] 78 (07/21 0612) Resp:  [15-29] 19 (07/21 0612) BP: (103-142)/(69-95) 114/81 (07/21 0612) SpO2:  [94 %-95 %] 95 % (07/21 0612) Weight:  [110.6 kg] 110.6 kg (07/21 0612) Physical Exam: General: Lying comfortably in hospital bed, no acute distress Cardiovascular: Regular rate and rhythm, no murmurs appreciated Respiratory: Normal work of breathing on room air, no bibasilar crackles, good air movement Abdomen: Nontender to palpation diffusely, no rebound or guarding Extremities: Warm, well-perfused, 1+ edema of bilateral lower extremities no pitting  Laboratory: Most recent CBC Lab Results  Component Value Date   WBC 11.9 (H) 07/07/2022   HGB 14.3 07/07/2022   HCT 42.8 07/07/2022   MCV 84.4 07/07/2022   PLT 433 (H) 07/07/2022   Most recent BMP    Latest Ref Rng & Units 07/09/2022    3:36 AM  BMP  Glucose 70 - 99 mg/dL 100   BUN 6 - 20 mg/dL 19   Creatinine 0.44 - 1.00 mg/dL 0.90   Sodium 135 - 145 mmol/L 141   Potassium 3.5 - 5.1 mmol/L 3.4   Chloride 98 -  111 mmol/L 108   CO2 22 - 32 mmol/L 26   Calcium 8.9 - 10.3 mg/dL 8.2     Other pertinent labs none   Imaging/Diagnostic Tests:  07/08/22 IMPRESSION: 1. Multiple fixed defects consistent with infarction involving the left circumflex coronary artery and right coronary artery.   2. Left ventricular dilatation is identified with decreased thickening  and wall motion involving the apical segment of the inferior wall and mid inferior wall.   3. Left ventricular ejection fraction 35%   4. Non invasive risk stratification*: High  Salvadore Oxford, MD 07/09/2022, 12:31 PM  PGY-1, [place] Intern pager: [phone], text pages welcome Secure chat group [place]

## 2022-07-09 NOTE — Discharge Summary (Addendum)
[facility] Discharge Summary  Patient name: [name] Medical record number: [id number] Date of birth: 1961-06-28 Age: 62 y.o. Gender: female Date of Admission: 07/04/2022  Date of Discharge: 07/09/22 Admitting Physician: Jacelyn Grip, MD  Primary Care Provider: Orvis Brill, DO Consultants: Cardiology  Indication for Hospitalization: heart failure  Brief Hospital Course:  [name] is a 61 y.o. female with a PMH of CAD, breast cancer, dyslipidemia, HTN, MI, and anemia who presented with SOB likely 2/2 CHF exacerbation. Brief hospital course below.   Acute hypoxemic respiratory failure likely 2/2 CHF exacerbation Patient presented in respiratory distress. Improved with steroids, bronchodilators, ativan, diuretics, BIPAP in ED. ECHO demonstrated moderately elevated pulm artery pressure with elevated LA, RA, IVC size. Cardiology consulted and recommended continued diuresis, outpatient follow-up with Cardiology. On further diuresis, patient reported chest pain with exertion and Cardiology recommended ischemic evaluation. Nuclear Stress test was negative for new ischemic changes. She was continued on diuresis over admission. By discharge, she was weaned down to RA. Weight on day of discharge 110.6 kg.    Cocaine, tobacco use; methadone dependence UDS negative on admission. TOC consulted with recommendations for drug cessation. Methadone continued over admission for chronic pain.   Complex atypical endometrial hyperplasia Suspected uterine cancer. Planning for preop evaluation for possible hysterectomy. Continued Provera over admission.  Issues for follow up: Cardiology will see outpatient. OSA evaluation, consider sleep studies.  Continue 40 Lasix PO daily, continue Zetia, continue potassium supplements 72mq TID. Recommend PCP recheck creatinine at follow up. AKI on admission had resolved to 0.90 on day of discharge. Recommend PCP recheck  potassium on follow up. Required multiple rounds of repletion while admitted. K on discharge 3.4. Patient re-started on home K 60 mEq daily.  Heart failure symptom education. Cessation of substances. Reschedule pre-op evaluation for possible hysterectomy.  Disposition: home  Discharge Condition: stable  Discharge Exam:  Objective: Vitals:   07/09/22 1249 07/09/22 1250  BP: (!) 99/53 (!) 102/56  Pulse:    Resp: 18 18  Temp:    SpO2:     General: Female standing next to hospital bed in no acute distress Cardiovascular: Regular rate and rhythm, no murmurs rubs or gallops Respiratory: No bibasilar crackles, normal work of breathing on room air Abdomen: Not soft, nontender, abdomen (improved from admission), no rebound or guarding Extremities: 1+ pitting edema bilaterally of lower extremities, warm  Significant Procedures: None  Significant Labs and Imaging:   Recent Labs  Lab 07/08/22 0246 07/09/22 0336  NA 140 141  K 3.2* 3.4*  CL 106 108  CO2 27 26  GLUCOSE 132* 100*  BUN 24* 19  CREATININE 0.94 0.90  CALCIUM 8.3* 8.2*  MG 2.1  --   ALKPHOS 81  --   AST 25  --   ALT 52*  --   ALBUMIN 2.7*  --    MYOCARDIAL IMAGING WITH SPECT (REST AND PHARMACOLOGIC-STRESS) GATED LEFT VENTRICULAR WALL MOTION STUDY LEFT VENTRICULAR EJECTION FRACTION   07/08/22 IMPRESSION: 1. Multiple fixed defects consistent with infarction involving the left circumflex coronary artery and right coronary artery.   2. Left ventricular dilatation is identified with decreased thickening and wall motion involving the apical segment of the inferior wall and mid inferior wall.   3. Left ventricular ejection fraction 35%   4. Non invasive risk stratification*: High  ECHO 07/05/22 IMPRESSIONS   1. Left ventricular ejection fraction, by estimation, is 55 to 60%. The  left ventricle has normal function.  The left ventricle has no regional  wall motion abnormalities. There is mild left ventricular  hypertrophy.  Left ventricular diastolic parameters  are consistent with Grade II diastolic dysfunction (pseudonormalization).   2. Right ventricular systolic function is normal. The right ventricular  size is normal. There is moderately elevated pulmonary artery systolic  pressure. The estimated right ventricular systolic pressure is 81.8 mmHg.   3. Left atrial size was mildly dilated.   4. Right atrial size was mildly dilated.   5. The mitral valve is normal in structure. Trivial mitral valve  regurgitation. No evidence of mitral stenosis.   6. The aortic valve is tricuspid. Aortic valve regurgitation is not  visualized. No aortic stenosis is present.   7. The inferior vena cava is dilated in size with <50% respiratory  variability, suggesting right atrial pressure of 15 mmHg.   Comparison(s): Prior images reviewed side by side.   Conclusion(s)/Recommendation(s): Grade 2 diastolic dysfunction and  elevated right sided pressures, consistent with diastolic heart failure.    CXR 07/04/22 IMPRESSION: Pulmonary opacities are favored represent pulmonary edema. The opacity is a little more focal in the right base raising the possibility of infiltrate/infection instead. Recommend clinical correlation.  Results/Tests Pending at Time of Discharge: none.  Discharge Medications:  Allergies as of 07/09/2022   No Known Allergies      Medication List     STOP taking these medications    amLODipine 5 MG tablet Commonly known as: NORVASC   camphor-menthol lotion Commonly known as: SARNA   ELDERBERRY PO   metoprolol succinate 50 MG 24 hr tablet Commonly known as: TOPROL-XL       TAKE these medications    acetaminophen 325 MG tablet Commonly known as: TYLENOL Take 2 tablets (650 mg total) by mouth every 6 (six) hours as needed for mild pain, moderate pain or headache.   allopurinol 300 MG tablet Commonly known as: ZYLOPRIM Take 1 tablet (300 mg total) by mouth daily.    ascorbic acid 500 MG tablet Commonly known as: VITAMIN C Take 500 mg by mouth daily.   aspirin EC 81 MG tablet Take 81 mg by mouth daily.   atorvastatin 40 MG tablet Commonly known as: LIPITOR TAKE 1 TABLET BY MOUTH DAILY AT 6 PM. What changed: when to take this   Baclofen 5 MG Tabs Take 1 tablet (5 mg) by mouth 2 (two) times daily as needed for muscle spasms.   carvedilol 25 MG tablet Commonly known as: COREG Take 1 tablet (25 mg total) by mouth 2 (two) times daily with a meal.   cholecalciferol 25 MCG (1000 UNIT) tablet Commonly known as: VITAMIN D3 Take 1,000 Units by mouth daily.   ezetimibe 10 MG tablet Commonly known as: ZETIA Take 1 tablet (10 mg total) by mouth daily. Start taking on: July 10, 2022   fluticasone 50 MCG/ACT nasal spray Commonly known as: FLONASE Place 1 spray into both nostrils daily. Start taking on: July 10, 2022   furosemide 20 MG tablet Commonly known as: LASIX Take 1 tablet (20 mg total) by mouth daily. Start taking on: July 10, 2022   isosorbide mononitrate 60 MG 24 hr tablet Commonly known as: IMDUR Take 1 tablet (60 mg total) by mouth at bedtime.   Jardiance 10 MG Tabs tablet Generic drug: empagliflozin Take 1 tablet (10 mg total) by mouth daily.   medroxyPROGESTERone 10 MG tablet Commonly known as: Provera Take 2 tablets (20 mg total) by mouth daily.   methadone 5  MG tablet Commonly known as: DOLOPHINE Take 3 tablets by mouth every 8  hours as needed  **06/17/22 What changed: reasons to take this   nitroGLYCERIN 0.4 MG SL tablet Commonly known as: NITROSTAT Place 1 tablet (0.4 mg total) under the tongue every 5 (five) minutes as needed for chest pain.   potassium chloride SA 20 MEQ tablet Commonly known as: Klor-Con M20 Take 1 tablet (20 mEq total) by mouth 3 (three) times daily.               Durable Medical Equipment  (From admission, onward)           Start     Ordered   07/06/22 1505  For home use  only DME Cane  Once        07/06/22 1504           Discharge Instructions: Please refer to Patient Instructions section of EMR for full details.  Patient was counseled important signs and symptoms that should prompt return to medical care, changes in medications, dietary instructions, activity restrictions, and follow up appointments.   Follow-Up Appointments:  Follow-up Information     [facility] Follow up.   Why: cane Contact information: [address] [address] [phone]         [name], PA-C Follow up.   Specialties: Cardiology, Radiology Why: [place] location - cardiology follow-up has been rescheduled to Monday Jul 19, 2022 at 10:55 AM (Arrive by 10:40 AM). Contact information: [address] [place] [address] [phone]         [place]. Go on 07/19/2022.   Specialty: Family Medicine Why: At 9:20 AM.  Please arrive by 9:05 AM.  This is a follow-up appointment with your primary care clinic.  Your PCP was unavailable, so you will see another resident in this clinic.  If this times a day does not work well for you, please call the office directly to reschedule.  Please note, this is scheduled before your cardiology appointment.  If this is too close, please call the clinic and reschedule. Contact information: [address] [id number] [place]               Salvadore Oxford, MD 07/09/2022, 2:28 PM PGY-1, [facility]  Upper Level Addendum: I have reviewed the above note, making necessary revisions as appropriate. I agree with the medical decision making and physical exam as noted above. Ezequiel Essex, MD PGY-3 [facility]

## 2022-07-09 NOTE — Progress Notes (Signed)
RN went over discharge instructions with patient at the bedside. RN went over medication changes and followup appointments. TOC meds at bedside. Cane at bedside. Patient is taking a shower. [employer] will be called after patient gets dressed. Tele monitor removed and CCMD notified. PIV removed by NT. Site is clean dry and intact.

## 2022-07-09 NOTE — Progress Notes (Signed)
Mobility Specialist: Progress Note   07/09/22 1137  Mobility  Activity Ambulated with assistance in hallway  Level of Assistance Standby assist, set-up cues, supervision of patient - no hands on  Assistive Device Cane  Distance Ambulated (ft) 350 ft  Activity Response Tolerated well  $Mobility charge 1 Mobility   Pre-Mobility: 82 HR, 96% SpO2 Post-Mobility: 77 HR, 148/97 (104) BP, 96% SpO2  Pt received in the bed and agreeable to mobility. C/o SOB, otherwise asymptomatic. Pt states that her chest pain is improved as well this session. Pt back to bed after session with call bell and phone at her side.   [facility] Kemesha Mosey Mobility Specialist Mobility Specialist [address]: [phone]

## 2022-07-09 NOTE — TOC Transition Note (Signed)
Transition of Care [facility]) - CM/SW Discharge Note   Patient Details  Name: [name] MRN: [id number] Date of Birth: 1961/11/27  [facility]) CM/SW Contact:  Zenon Mayo, RN Phone Number: 07/09/2022, 3:31 PM   Clinical Narrative:    Patient is for dc home today, she told Staff RN she will take a lift home and she is paying for it.  She has the cane in her room.    Final next level of care: Home/Self Care Barriers to Discharge: Continued Medical Work up   Patient Goals and CMS Choice Patient states their goals for this hospitalization and ongoing recovery are:: return home   Choice offered to / list presented to : NA  Discharge Placement                       Discharge Plan and Services In-house Referral: NA Discharge Planning Services: CM Consult Post Acute Care Choice: NA          DME Arranged: Kasandra Knudsen DME Agency: [facility] Date DME Agency Contacted: 07/06/22 Time DME Agency Contacted: [id number] Representative spoke with at DME Agency: Jodell Cipro HH Arranged: NA          Social Determinants of Health ([place]) Interventions     Readmission Risk Interventions    07/06/2022    3:02 PM  Readmission Risk Prevention Plan  Transportation Screening Complete  PCP or Specialist Appt within 3-5 Days Complete  HRI or [place] Complete  Social Work Consult for [place] Planning/Counseling Complete  Palliative Care Screening Not Applicable  Medication Review [employer] photographer) Complete

## 2022-07-09 NOTE — Consult Note (Signed)
   [facility] Inpatient Consult   07/09/2022  [name] 13-Dec-1961 [id number]  Managed Medicaid:  Primary Care Provider: Orvis Brill, DO [facility]  Discussed in unit progression meeting.  Came to bedside, patient sound asleep listening to scriptures, did not awake to name, did not awaken, left an appointment reminder card with 24 hour nurse advise line. Patient with a history of MM team follow up in the past.  Plan:  Continue to follow. Follow up with the MM team  Natividad Brood, RN BSN [facility] Liaison  [phone] business mobile phone Toll free office [phone]  Fax number: [phone] [email or web address]

## 2022-07-10 DIAGNOSIS — M170 Bilateral primary osteoarthritis of knee: Secondary | ICD-10-CM | POA: Diagnosis not present

## 2022-07-11 DIAGNOSIS — M170 Bilateral primary osteoarthritis of knee: Secondary | ICD-10-CM | POA: Diagnosis not present

## 2022-07-12 ENCOUNTER — Telehealth: Payer: Self-pay

## 2022-07-12 DIAGNOSIS — M170 Bilateral primary osteoarthritis of knee: Secondary | ICD-10-CM | POA: Diagnosis not present

## 2022-07-12 NOTE — Patient Outreach (Signed)
  Care Coordination [facility] Note Transition Care Management Follow-up Telephone Call-  **Note; patient has temporary address; [address], [place], [place] [address] Date of discharge and from where: [facility] 07/04/22-07/09/22 How have you been since you were released from the hospital? "I am doing fine, trying to follow my discharge instructions". Any questions or concerns? Yes- Patient has a sliding Lasix dose based on her weight and she inquired about taking up to 4- '20mg'$  tabs per day.  Patient does not have a scale so encouraged her not to increase her dose to 4 tabs unless she is able to weigh herself.  Patient denies pedal edema and said she took one extra Lasix yesterday.  Referred patient to Managed Medicaid team for call and assistance with obtaining a scale.  Items Reviewed: Did the pt receive and understand the discharge instructions provided? Yes  Medications obtained and verified? Yes  Other? No  Any new allergies since your discharge? No  Dietary orders reviewed? No Do you have support at home? Yes   Home Care and Equipment/Supplies: Were home health services ordered? not applicable If so, what is the name of the agency? N/A  Has the agency set up a time to come to the patient's home? no Were any new equipment or medical supplies ordered?  No What is the name of the medical supply agency? N/A Were you able to get the supplies/equipment? not applicable Do you have any questions related to the use of the equipment or supplies? No  Functional Questionnaire: (I = Independent and D = Dependent) ADLs: I  Bathing/Dressing- I  Meal Prep- I  Eating- I  Maintaining continence- I  Transferring/Ambulation- I  Managing Meds- I  Follow up appointments reviewed:  PCP Hospital f/u appt confirmed? Yes  Scheduled to see [facility] resident on 07/19/22 @ 0920. [facility] f/u appt confirmed? Yes  Scheduled to see Dr. Idolina Primer on 07/19/22 @ 10:55. Are transportation arrangements needed? No   If their condition worsens, is the pt aware to call PCP or go to the Emergency Dept.? Yes Was the patient provided with contact information for the PCP's office or ED? Yes Was to pt encouraged to call back with questions or concerns? Yes  SDOH assessments and interventions completed:   Yes  Care Coordination Interventions Activated:  Yes Care Coordination Interventions:  Referred for Care Coordination Services:  RN Care Coordinator-Managed Medicaid team  Encounter Outcome:  Pt. Visit Completed

## 2022-07-13 ENCOUNTER — Other Ambulatory Visit (HOSPITAL_COMMUNITY): Payer: Self-pay

## 2022-07-13 ENCOUNTER — Other Ambulatory Visit: Payer: Self-pay | Admitting: Adult Health

## 2022-07-13 ENCOUNTER — Encounter: Payer: Self-pay | Admitting: Gynecologic Oncology

## 2022-07-13 DIAGNOSIS — C50412 Malignant neoplasm of upper-outer quadrant of left female breast: Secondary | ICD-10-CM

## 2022-07-13 DIAGNOSIS — M170 Bilateral primary osteoarthritis of knee: Secondary | ICD-10-CM | POA: Diagnosis not present

## 2022-07-14 ENCOUNTER — Other Ambulatory Visit (HOSPITAL_COMMUNITY): Payer: Self-pay

## 2022-07-14 DIAGNOSIS — M170 Bilateral primary osteoarthritis of knee: Secondary | ICD-10-CM | POA: Diagnosis not present

## 2022-07-15 ENCOUNTER — Other Ambulatory Visit (HOSPITAL_COMMUNITY): Payer: Self-pay

## 2022-07-15 ENCOUNTER — Telehealth: Payer: Self-pay | Admitting: *Deleted

## 2022-07-15 ENCOUNTER — Inpatient Hospital Stay: Payer: Medicaid Other | Admitting: Gynecologic Oncology

## 2022-07-15 ENCOUNTER — Other Ambulatory Visit: Payer: Self-pay | Admitting: Adult Health

## 2022-07-15 DIAGNOSIS — N8502 Endometrial intraepithelial neoplasia [EIN]: Secondary | ICD-10-CM

## 2022-07-15 DIAGNOSIS — M170 Bilateral primary osteoarthritis of knee: Secondary | ICD-10-CM | POA: Diagnosis not present

## 2022-07-15 DIAGNOSIS — C50412 Malignant neoplasm of upper-outer quadrant of left female breast: Secondary | ICD-10-CM

## 2022-07-15 MED ORDER — METHADONE HCL 5 MG PO TABS
15.0000 mg | ORAL_TABLET | Freq: Three times a day (TID) | ORAL | 0 refills | Status: DC | PRN
  Filled 2022-07-15: qty 270, 30d supply, fill #0
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
## 2022-07-16 ENCOUNTER — Other Ambulatory Visit: Payer: Self-pay | Admitting: *Deleted

## 2022-07-16 DIAGNOSIS — M170 Bilateral primary osteoarthritis of knee: Secondary | ICD-10-CM | POA: Diagnosis not present

## 2022-07-16 NOTE — Patient Outreach (Signed)
Medicaid Managed Care   Nurse Care Manager Note  07/16/2022 Name:  [name] MRN:  [id number] DOB:  1961/05/06  [name] is an 61 y.o. year old female who is a primary patient of Orvis Brill, DO.  The [facility] Managed Care Coordination team was consulted for assistance with:    CHF  [name] was given information about Medicaid Managed Care Coordination team services today. [name] agreed to services and verbal consent obtained.  Engaged with patient by telephone for initial visit in response to provider referral for case management and/or care coordination services.   Assessments/Interventions:  Review of past medical history, allergies, medications, health status, including review of consultants reports, laboratory and other test data, was performed as part of comprehensive evaluation and provision of chronic care management services.  SDOH (Social Determinants of Health) assessments and interventions performed: SDOH Interventions    Flowsheet Row Most Recent Value  SDOH Interventions   Food Insecurity Interventions Intervention Not Indicated  Housing Interventions Intervention Not Indicated  Transportation Interventions Intervention Not Indicated       Care Plan  No Known Allergies  Medications Reviewed Today     Reviewed by Melissa Montane, RN (Registered Nurse) on 07/16/22 at 55  Med List Status: <None>   Medication Order Taking? Sig Documenting Provider Last Dose Status Informant  acetaminophen (TYLENOL) 325 MG tablet [id number] Yes Take 2 tablets (650 mg total) by mouth every 6 (six) hours as needed for mild pain, moderate pain or headache. Ezequiel Essex, MD Taking Active   allopurinol (ZYLOPRIM) 300 MG tablet [id number] Yes Take 1 tablet (300 mg total) by mouth daily. [name], MD Taking Active Self  ascorbic acid (VITAMIN C) 500 MG tablet [id number] Yes Take 500 mg by mouth daily. [provider] Taking Active Self   aspirin EC 81 MG tablet [id number] Yes Take 81 mg by mouth daily. [provider] Taking Active Self  atorvastatin (LIPITOR) 40 MG tablet [id number] Yes TAKE 1 TABLET BY MOUTH DAILY AT 6 PM.  Patient taking differently: Take 40 mg by mouth daily.   [name], MD Taking Active Self  Baclofen 5 MG TABS [id number] Yes Take 1 tablet (5 mg) by mouth 2 (two) times daily as needed for muscle spasms. Ezequiel Essex, MD Taking Active   carvedilol (COREG) 25 MG tablet [id number] Yes Take 1 tablet (25 mg total) by mouth 2 (two) times daily with a meal. Ezequiel Essex, MD Taking Active   cholecalciferol (VITAMIN D3) 25 MCG (1000 UNIT) tablet [id number] Yes Take 1,000 Units by mouth daily. [provider] Taking Active Self  empagliflozin (JARDIANCE) 10 MG TABS tablet [id number] Yes Take 1 tablet (10 mg total) by mouth daily. Ezequiel Essex, MD Taking Active   ezetimibe (ZETIA) 10 MG tablet [id number] Yes Take 1 tablet (10 mg total) by mouth daily. Ezequiel Essex, MD Taking Active     Discontinued 03/10/12 1404 (Error)   fluticasone (FLONASE) 50 MCG/ACT nasal spray [id number] No Place 1 spray into both nostrils daily.  Patient not taking: Reported on 07/16/2022   Ezequiel Essex, MD Not Taking Active   furosemide (LASIX) 20 MG tablet [id number] Yes Take 1 tablet (20 mg total) by mouth daily. Ezequiel Essex, MD Taking Active   isosorbide mononitrate (IMDUR) 60 MG 24 hr tablet [id number] Yes Take 1 tablet (60 mg total) by mouth at bedtime. [name], MD Taking Active Self  medroxyPROGESTERone (PROVERA) 10 MG tablet [id number] Yes Take 2 tablets (20 mg  total) by mouth daily. Joylene John D, NP Taking Active Self  methadone (DOLOPHINE) 5 MG tablet [id number] Yes Take 3 tablets by mouth every 8  hours as needed  **06/17/22 Gardenia Phlegm, NP Taking Active   nitroGLYCERIN (NITROSTAT) 0.4 MG SL tablet [id number] Yes Place 1 tablet (0.4 mg total) under the tongue every 5 (five) minutes as  needed for chest pain. [name], MD Taking Active Self  potassium chloride SA (KLOR-CON M20) 20 MEQ tablet [id number] Yes Take 1 tablet (20 mEq total) by mouth 3 (three) times daily. [name], MD Taking Active Self            Patient Active Problem List   Diagnosis Date Noted   Headache 07/08/2022   Polycythemia 07/05/2022   Elevated liver enzymes 07/05/2022   Acute exacerbation of CHF (congestive heart failure) ([place]) 07/04/2022   CHF (congestive heart failure) ([place]) 07/04/2022   At risk for obstructive sleep apnea 06/15/2022   Mood altered 06/15/2022   Cocaine abuse ([place]) 03/31/2022   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[place]) 03/31/2022   Tension headache 03/31/2022   Chest pain 03/30/2022   Hypomagnesemia 03/30/2022    Class: Acute   Constipation 03/30/2022   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   BMI 40.0-44.9, adult ([place]) 10/26/2021   COVID-19 vaccine administered 07/27/2021   Prediabetes 01/01/2021   Concern about ear disease without diagnosis 01/01/2021   Neck mass 11/05/2020   Arthritis of right knee 06/11/2020   Acquired absence of both breasts 04/01/2020   Bilateral leg edema 06/21/2019   Dysfunctional uterine bleeding 04/30/2019   Right leg pain 09/13/2018   Acquired trigger finger of right ring finger 05/29/2018   Hyperlipidemia LDL goal <70 02/08/2018   History of ST elevation myocardial infarction (STEMI) 12/01/2017   Endometrial hyperplasia, simple 11/08/2017   Unilateral primary osteoarthritis, right knee 10/27/2017   Essential hypertension 04/17/2016   CAD (coronary artery disease) 04/17/2016   Shortness of breath    Thrombocytosis 03/10/2015   Tobacco abuse 12/02/2014   Cancer of overlapping sites of right female breast ([place]) 09/16/2014   Breast cancer of upper-outer quadrant of left female breast ([place]) 09/16/2014   Hot flashes due to tamoxifen 05/09/2014   Lymphedema of arm 11/08/2013   Hypokalemia 03/31/2012   Atherosclerosis of aorta  (HCC)     Conditions to be addressed/monitored per PCP order:  CHF  Care Plan : RN Care Manager Plan of Care  Updates made by Melissa Montane, RN since 07/16/2022 12:00 AM     Problem: Health Mangement needs related to CHF      Long-Range Goal: Development of Plan of Care to address Health Mangement needs related to CHF   Start Date: 07/16/2022  Expected End Date: 09/14/2022  Priority: High  Note:   Current Barriers:  Chronic Disease Management support and education needs related to CHF  RNCM Clinical Goal(s):  Patient will verbalize understanding of plan for management of CHF as evidenced by Patient reports take all medications exactly as prescribed and will call provider for medication related questions as evidenced by patient reports and EMR documentation    attend all scheduled medical appointments: 7/31 with PCP, Cardiology and GYN Oncology as evidenced by provider documentation           Interventions: Inter-disciplinary care team collaboration (see longitudinal plan of care) Evaluation of current treatment plan related to  self management and patient's adherence to plan as established by provider   Heart Failure Interventions:  (Status: New goal.)  Long Term Goal  Wt Readings from Last 3 Encounters:  07/09/22 243 lb 14.4 oz (110.6 kg)  06/23/22 245 lb 11.2 oz (111.4 kg)  06/09/22 242 lb (109.8 kg)   Basic overview and discussion of pathophysiology of Heart Failure reviewed Reviewed Heart Failure Action Plan in depth and provided written copy Assessed need for readable accurate scales in home Advised patient to weigh each morning after emptying bladder Discussed importance of daily weight and advised patient to weigh and record daily Discussed the importance of keeping all appointments with provider Provided patient with education about the role of exercise in the management of heart failure Assessed social determinant of health barriers Collaborated with PCP for needed  scale Advised patient to continue to work on cutting back on smoking, provided 1-800-QUIT-NOW Referral to BSW for assistance with resources(dental and vision providers)  Patient Goals/Self-Care Activities: Take medications as prescribed   Attend all scheduled provider appointments Call provider office for new concerns or questions  Work with the social worker to address care coordination needs and will continue to work with the clinical team to address health care and disease management related needs call office if I gain more than 2 pounds in one day or 5 pounds in one week track weight in diary use salt in moderation weigh myself daily follow rescue plan if symptoms flare-up       Follow Up:  Patient agrees to Care Plan and Follow-up.  Plan: The Managed Medicaid care management team will reach out to the patient again over the next 30 days.  Date/time of next scheduled RN care management/care coordination outreach:  08/16/22 @ 2:30pm  [name] RN, [place] RN Care Coordinator

## 2022-07-16 NOTE — Patient Instructions (Signed)
Visit Information  [name] was given information about Medicaid Managed Care team care coordination services as a part of their [place] Medicaid benefit. [name] verbally consented to engagement with the [facility] Managed Care team.   If you are experiencing a medical emergency, please call 911 or report to your local emergency department or urgent care.   If you have a non-emergency medical problem during routine business hours, please contact your provider's office and ask to speak with a nurse.   For questions related to your [facility], please call: [phone] or visit the homepage here: [email or web address]  If you would like to schedule transportation through your [facility], please call the following number at least 2 days in advance of your appointment: [phone]   Rides for urgent appointments can also be made after hours by calling Member Services.  Call the [place] at [phone], at any time, 24 hours a day, 7 days a week. If you are in danger or need immediate medical attention call 911.  If you would like help to quit smoking, call 1-800-QUIT-NOW (208)869-7386) OR Espaol: 1-855-Djelo-Ya (0-768-088-1103) o para ms informacin haga clic aqu or Text READY to 200-400 to register via text  [name],   Please see education materials related to CHF provided as print materials.   The patient verbalized understanding of instructions, educational materials, and care plan provided today and agreed to receive a mailed copy of patient instructions, educational materials, and care plan.   Telephone follow up appointment with Managed Medicaid care management team member scheduled for:08/16/22 @ 2:30pm  [name] RN, BSN [name] RN Care Coordinator   Following is a copy of your plan  of care:  Care Plan : [place] of Care  Updates made by Melissa Montane, RN since 07/16/2022 12:00 AM     Problem: Health Mangement needs related to CHF      Long-Range Goal: Development of Plan of Care to address Health Mangement needs related to CHF   Start Date: 07/16/2022  Expected End Date: 09/14/2022  Priority: High  Note:   Current Barriers:  Chronic Disease Management support and education needs related to CHF  RNCM Clinical Goal(s):  Patient will verbalize understanding of plan for management of CHF as evidenced by Patient reports take all medications exactly as prescribed and will call provider for medication related questions as evidenced by patient reports and EMR documentation    attend all scheduled medical appointments: 7/31 with PCP, Cardiology and GYN Oncology as evidenced by provider documentation           Interventions: Inter-disciplinary care team collaboration (see longitudinal plan of care) Evaluation of current treatment plan related to  self management and patient's adherence to plan as established by provider   Heart Failure Interventions:  (Status: New goal.)  Long Term Goal  Wt Readings from Last 3 Encounters:  07/09/22 243 lb 14.4 oz (110.6 kg)  06/23/22 245 lb 11.2 oz (111.4 kg)  06/09/22 242 lb (109.8 kg)   Basic overview and discussion of pathophysiology of Heart Failure reviewed Reviewed Heart Failure Action Plan in depth and provided written copy Assessed need for readable accurate scales in home Advised patient to weigh each morning after emptying bladder Discussed importance of daily weight and advised patient to weigh and record daily Discussed the importance of keeping all appointments with provider Provided patient with education  about the role of exercise in the management of heart failure Assessed social determinant of health barriers Collaborated with PCP for needed scale Advised patient to continue to work on cutting back on  smoking, provided 1-800-QUIT-NOW Referral to BSW for assistance with resources(dental and vision providers)  Patient Goals/Self-Care Activities: Take medications as prescribed   Attend all scheduled provider appointments Call provider office for new concerns or questions  Work with the social worker to address care coordination needs and will continue to work with the clinical team to address health care and disease management related needs call office if I gain more than 2 pounds in one day or 5 pounds in one week track weight in diary use salt in moderation weigh myself daily follow rescue plan if symptoms flare-up

## 2022-07-17 DIAGNOSIS — M170 Bilateral primary osteoarthritis of knee: Secondary | ICD-10-CM | POA: Diagnosis not present

## 2022-07-18 ENCOUNTER — Encounter: Payer: Self-pay | Admitting: Physician Assistant

## 2022-07-18 DIAGNOSIS — M170 Bilateral primary osteoarthritis of knee: Secondary | ICD-10-CM | POA: Diagnosis not present
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
## 2022-07-19 ENCOUNTER — Ambulatory Visit: Payer: Medicaid Other

## 2022-07-19 ENCOUNTER — Telehealth: Payer: Self-pay

## 2022-07-19 ENCOUNTER — Inpatient Hospital Stay: Payer: Medicaid Other | Admitting: Gynecologic Oncology

## 2022-07-19 ENCOUNTER — Ambulatory Visit: Payer: Medicaid Other | Admitting: Physician Assistant

## 2022-07-19 DIAGNOSIS — E785 Hyperlipidemia, unspecified: Secondary | ICD-10-CM

## 2022-07-19 DIAGNOSIS — N8502 Endometrial intraepithelial neoplasia [EIN]: Secondary | ICD-10-CM

## 2022-07-19 DIAGNOSIS — I10 Essential (primary) hypertension: Secondary | ICD-10-CM

## 2022-07-19 DIAGNOSIS — I5032 Chronic diastolic (congestive) heart failure: Secondary | ICD-10-CM

## 2022-07-19 DIAGNOSIS — Z01810 Encounter for preprocedural cardiovascular examination: Secondary | ICD-10-CM

## 2022-07-19 DIAGNOSIS — I272 Pulmonary hypertension, unspecified: Secondary | ICD-10-CM

## 2022-07-19 DIAGNOSIS — M170 Bilateral primary osteoarthritis of knee: Secondary | ICD-10-CM | POA: Diagnosis not present

## 2022-07-19 DIAGNOSIS — I2510 Atherosclerotic heart disease of native coronary artery without angina pectoris: Secondary | ICD-10-CM
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
## 2022-07-19 NOTE — Telephone Encounter (Signed)
Pt called the office today stating she needs to cancel her appointment with Dr. Berline Lopes for today. She states her side is hurting and she isn't leaving the house. She did state she is still taking the medication (Provera)  Melissa prescribed.

## 2022-07-20 ENCOUNTER — Other Ambulatory Visit: Payer: Self-pay

## 2022-07-20 DIAGNOSIS — M170 Bilateral primary osteoarthritis of knee: Secondary | ICD-10-CM | POA: Diagnosis not present

## 2022-07-20 NOTE — Patient Outreach (Signed)
Medicaid Managed Care Social Work Note  07/20/2022 Name:  [name] MRN:  [id number] DOB:  January 25, 1961  [name] is an 61 y.o. year old female who is a primary patient of Orvis Brill, DO.  The Medicaid Managed Care Coordination team was consulted for assistance with:  Community Resources   [name] was given information about Medicaid Managed Care Coordination team services today. [name] agreed to services and verbal consent obtained.  Engaged with patient  for by telephone forfollow up visit in response to referral for case management and/or care coordination services.   Assessments/Interventions:  Review of past medical history, allergies, medications, health status, including review of consultants reports, laboratory and other test data, was performed as part of comprehensive evaluation and provision of chronic care management services.  SDOH: (Social Determinant of Health) assessments and interventions performed: BSW completed a telephone outreach with patient regarding resources. Patient stated everything is good right now and no resources are needed at this time. She did have a question about receiving mail at her new address, BSW informed to go to the post office to have all mail forwarded.   Advanced Directives Status:  Not addressed in this encounter.  Care Plan                 No Known Allergies  Medications Reviewed Today     Reviewed by Melissa Montane, RN (Registered Nurse) on 07/16/22 at 25  Med List Status: <None>   Medication Order Taking? Sig Documenting Provider Last Dose Status Informant  acetaminophen (TYLENOL) 325 MG tablet [id number] Yes Take 2 tablets (650 mg total) by mouth every 6 (six) hours as needed for mild pain, moderate pain or headache. Ezequiel Essex, MD Taking Active   allopurinol (ZYLOPRIM) 300 MG tablet [id number] Yes Take 1 tablet (300 mg total) by mouth daily. [name], MD Taking Active Self  ascorbic acid  (VITAMIN C) 500 MG tablet [id number] Yes Take 500 mg by mouth daily. [provider] Taking Active Self  aspirin EC 81 MG tablet [id number] Yes Take 81 mg by mouth daily. [provider] Taking Active Self  atorvastatin (LIPITOR) 40 MG tablet [id number] Yes TAKE 1 TABLET BY MOUTH DAILY AT 6 PM.  Patient taking differently: Take 40 mg by mouth daily.   [name], MD Taking Active Self  Baclofen 5 MG TABS [id number] Yes Take 1 tablet (5 mg) by mouth 2 (two) times daily as needed for muscle spasms. Ezequiel Essex, MD Taking Active   carvedilol (COREG) 25 MG tablet [id number] Yes Take 1 tablet (25 mg total) by mouth 2 (two) times daily with a meal. Ezequiel Essex, MD Taking Active   cholecalciferol (VITAMIN D3) 25 MCG (1000 UNIT) tablet [id number] Yes Take 1,000 Units by mouth daily. [provider] Taking Active Self  empagliflozin (JARDIANCE) 10 MG TABS tablet [id number] Yes Take 1 tablet (10 mg total) by mouth daily. Ezequiel Essex, MD Taking Active   ezetimibe (ZETIA) 10 MG tablet [id number] Yes Take 1 tablet (10 mg total) by mouth daily. Ezequiel Essex, MD Taking Active     Discontinued 03/10/12 1404 (Error)   fluticasone (FLONASE) 50 MCG/ACT nasal spray [id number] No Place 1 spray into both nostrils daily.  Patient not taking: Reported on 07/16/2022   Ezequiel Essex, MD Not Taking Active   furosemide (LASIX) 20 MG tablet [id number] Yes Take 1 tablet (20 mg total) by mouth daily. Ezequiel Essex, MD Taking Active   isosorbide mononitrate (IMDUR) 60  MG 24 hr tablet [id number] Yes Take 1 tablet (60 mg total) by mouth at bedtime. [name], MD Taking Active Self  medroxyPROGESTERone (PROVERA) 10 MG tablet [id number] Yes Take 2 tablets (20 mg total) by mouth daily. Joylene John D, NP Taking Active Self  methadone (DOLOPHINE) 5 MG tablet [id number] Yes Take 3 tablets by mouth every 8  hours as needed  **06/17/22 Gardenia Phlegm, NP Taking Active   nitroGLYCERIN  (NITROSTAT) 0.4 MG SL tablet [id number] Yes Place 1 tablet (0.4 mg total) under the tongue every 5 (five) minutes as needed for chest pain. [name], MD Taking Active Self  potassium chloride SA (KLOR-CON M20) 20 MEQ tablet [id number] Yes Take 1 tablet (20 mEq total) by mouth 3 (three) times daily. 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   Tension headache 03/31/2022   Chest pain 03/30/2022   Hypomagnesemia 03/30/2022    Class: Acute   Constipation 03/30/2022   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   BMI 40.0-44.9, adult ([place]) 10/26/2021   COVID-19 vaccine administered 07/27/2021   Prediabetes 01/01/2021   Concern about ear disease without diagnosis 01/01/2021   Neck mass 11/05/2020   Arthritis of right knee 06/11/2020   Acquired absence of both breasts 04/01/2020   Bilateral leg edema 06/21/2019   Dysfunctional uterine bleeding 04/30/2019   Right leg pain 09/13/2018   Acquired trigger finger of right ring finger 05/29/2018   Hyperlipidemia LDL goal <70 02/08/2018   History of ST elevation myocardial infarction (STEMI) 12/01/2017   Endometrial hyperplasia, simple 11/08/2017   Unilateral primary osteoarthritis, right knee 10/27/2017   Essential hypertension 04/17/2016   CAD (coronary artery disease) 04/17/2016   Shortness of breath    Thrombocytosis 03/10/2015   Tobacco abuse 12/02/2014   Cancer of overlapping sites of right female breast ([place]) 09/16/2014   Breast cancer of upper-outer quadrant of left female breast ([place]) 09/16/2014   Hot flashes  due to tamoxifen 05/09/2014   Lymphedema of arm 11/08/2013   Hypokalemia 03/31/2012   Atherosclerosis of aorta (HCC)     Conditions to be addressed/monitored per PCP order:   resources  There are no care plans that you recently modified to display for this patient.   Follow up:  Patient agrees to Care Plan and Follow-up.  Plan: The Managed Medicaid care management team will reach out to the patient again over the next 30-45 days.  Date/time of next scheduled Social Work care management/care coordination outreach:  08/23/22  Mickel Fuchs, Arita Miss, [place] Medicaid Team  [phone]

## 2022-07-20 NOTE — Patient Instructions (Signed)
Visit Information  Ms. Fayette was given information about Medicaid Managed Care team care coordination services as a part of their [place] Medicaid benefit. [name] verbally consented to engagement with the [facility] Managed Care team.   If you are experiencing a medical emergency, please call 911 or report to your local emergency department or urgent care.   If you have a non-emergency medical problem during routine business hours, please contact your provider's office and ask to speak with a nurse.   For questions related to your [facility], please call: [phone] or visit the homepage here: [email or web address]  If you would like to schedule transportation through your [facility], please call the following number at least 2 days in advance of your appointment: [phone]   Rides for urgent appointments can also be made after hours by calling Member Services.  Call the [place] at [phone], at any time, 24 hours a day, 7 days a week. If you are in danger or need immediate medical attention call 911.  If you would like help to quit smoking, call 1-800-QUIT-NOW (218)598-7124) OR Espaol: 1-855-Djelo-Ya (9-741-638-4536) o para ms informacin haga clic aqu or Text READY to 200-400 to register via text  Ms. Aaberg - following are the goals we discussed in your visit today:   Goals Addressed   None      Social Worker will follow up in 30-45 days .   Mickel Fuchs, BSW, [place] Managed Medicaid Team  [phone]   Following is a copy of your plan of care:  There are no care plans that you recently modified to display for this patient.

## 2022-07-21 DIAGNOSIS — M170 Bilateral primary osteoarthritis of knee: Secondary | ICD-10-CM | POA: Diagnosis not present

## 2022-07-22 DIAGNOSIS — M170 Bilateral primary osteoarthritis of knee: Secondary | ICD-10-CM | POA: Diagnosis not present

## 2022-07-23 DIAGNOSIS — M170 Bilateral primary osteoarthritis of knee: Secondary | ICD-10-CM | POA: Diagnosis not present

## 2022-07-24 DIAGNOSIS — M170 Bilateral primary osteoarthritis of knee: Secondary | ICD-10-CM | POA: Diagnosis not present

## 2022-07-25 DIAGNOSIS — M170 Bilateral primary osteoarthritis of knee: Secondary | ICD-10-CM | POA: Diagnosis not present

## 2022-07-26 ENCOUNTER — Ambulatory Visit: Payer: Medicaid Other | Admitting: Family Medicine

## 2022-07-26 ENCOUNTER — Ambulatory Visit: Payer: Medicaid Other | Admitting: Podiatry

## 2022-07-26 DIAGNOSIS — M170 Bilateral primary osteoarthritis of knee: Secondary | ICD-10-CM | POA: Diagnosis not present

## 2022-07-27 DIAGNOSIS — M170 Bilateral primary osteoarthritis of knee: Secondary | ICD-10-CM | POA: Diagnosis not present

## 2022-07-28 DIAGNOSIS — M170 Bilateral primary osteoarthritis of knee: Secondary | ICD-10-CM | POA: Diagnosis not present

## 2022-07-29 DIAGNOSIS — M170 Bilateral primary osteoarthritis of knee: Secondary | ICD-10-CM | POA: Diagnosis not present

## 2022-07-30 DIAGNOSIS — M170 Bilateral primary osteoarthritis of knee: Secondary | ICD-10-CM | POA: Diagnosis not present

## 2022-07-31 DIAGNOSIS — M170 Bilateral primary osteoarthritis of knee: Secondary | ICD-10-CM | POA: Diagnosis not present

## 2022-08-01 DIAGNOSIS — M170 Bilateral primary osteoarthritis of knee: Secondary | ICD-10-CM | POA: Diagnosis not present
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
## 2022-08-02 DIAGNOSIS — M170 Bilateral primary osteoarthritis of knee: Secondary | ICD-10-CM | POA: Diagnosis not present

## 2022-08-03 DIAGNOSIS — M170 Bilateral primary osteoarthritis of knee: Secondary | ICD-10-CM | POA: Diagnosis not present

## 2022-08-04 ENCOUNTER — Ambulatory Visit: Payer: Medicaid Other | Admitting: Nurse Practitioner

## 2022-08-04 DIAGNOSIS — M170 Bilateral primary osteoarthritis of knee: Secondary | ICD-10-CM | POA: Diagnosis not present

## 2022-08-05 DIAGNOSIS — M170 Bilateral primary osteoarthritis of knee: Secondary | ICD-10-CM | POA: Diagnosis not present

## 2022-08-06 DIAGNOSIS — M170 Bilateral primary osteoarthritis of knee: Secondary | ICD-10-CM | POA: Diagnosis not present

## 2022-08-07 DIAGNOSIS — M170 Bilateral primary osteoarthritis of knee: Secondary | ICD-10-CM | POA: Diagnosis not present

## 2022-08-08 DIAGNOSIS — M170 Bilateral primary osteoarthritis of knee: Secondary | ICD-10-CM | POA: Diagnosis not present

## 2022-08-09 DIAGNOSIS — M170 Bilateral primary osteoarthritis of knee: Secondary | ICD-10-CM | POA: Diagnosis not present

## 2022-08-10 ENCOUNTER — Other Ambulatory Visit (HOSPITAL_COMMUNITY): Payer: Self-pay

## 2022-08-10 ENCOUNTER — Other Ambulatory Visit: Payer: Self-pay | Admitting: Adult Health

## 2022-08-10 DIAGNOSIS — M170 Bilateral primary osteoarthritis of knee: Secondary | ICD-10-CM | POA: Diagnosis not present

## 2022-08-10 DIAGNOSIS — C50412 Malignant neoplasm of upper-outer quadrant of left female breast: Secondary | ICD-10-CM

## 2022-08-11 ENCOUNTER — Other Ambulatory Visit: Payer: Self-pay | Admitting: Adult Health

## 2022-08-11 ENCOUNTER — Other Ambulatory Visit (HOSPITAL_COMMUNITY): Payer: Self-pay

## 2022-08-11 DIAGNOSIS — M170 Bilateral primary osteoarthritis of knee: Secondary | ICD-10-CM | POA: Diagnosis not present

## 2022-08-11 DIAGNOSIS — C50412 Malignant neoplasm of upper-outer quadrant of left female breast: Secondary | ICD-10-CM
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
## 2022-08-12 ENCOUNTER — Other Ambulatory Visit: Payer: Self-pay | Admitting: Hematology and Oncology

## 2022-08-12 ENCOUNTER — Other Ambulatory Visit (HOSPITAL_COMMUNITY): Payer: Self-pay

## 2022-08-12 DIAGNOSIS — C50412 Malignant neoplasm of upper-outer quadrant of left female breast: Secondary | ICD-10-CM

## 2022-08-12 DIAGNOSIS — M170 Bilateral primary osteoarthritis of knee: Secondary | ICD-10-CM | POA: Diagnosis not present

## 2022-08-12 MED ORDER — METHADONE HCL 5 MG PO TABS
15.0000 mg | ORAL_TABLET | Freq: Three times a day (TID) | ORAL | 0 refills | Status: DC | PRN
  Filled 2022-08-12: qty 270, 30d supply, fill #0

## 2022-08-12 NOTE — Progress Notes (Signed)
Methadone prescription refilled per patient request. She has chronic pain and has been on methadone for yrs

## 2022-08-13 ENCOUNTER — Ambulatory Visit: Payer: Medicaid Other | Admitting: Nurse Practitioner

## 2022-08-13 DIAGNOSIS — M170 Bilateral primary osteoarthritis of knee: Secondary | ICD-10-CM | POA: Diagnosis not present

## 2022-08-14 DIAGNOSIS — M170 Bilateral primary osteoarthritis of knee: Secondary | ICD-10-CM | POA: Diagnosis not present

## 2022-08-15 DIAGNOSIS — M170 Bilateral primary osteoarthritis of knee: Secondary | ICD-10-CM | POA: Diagnosis not present

## 2022-08-16 ENCOUNTER — Other Ambulatory Visit: Payer: Self-pay | Admitting: *Deleted

## 2022-08-16 DIAGNOSIS — M170 Bilateral primary osteoarthritis of knee: Secondary | ICD-10-CM | POA: Diagnosis not present

## 2022-08-16 NOTE — Patient Outreach (Signed)
  Medicaid Managed Care   Unsuccessful Attempt Note   08/16/2022 Name: [name] MRN: [id number] DOB: Jun 19, 1961  Referred by: Orvis Brill, DO Reason for referral : High Risk Managed Medicaid (Unsuccessful RNCM follow up telephone outreach)   An unsuccessful telephone outreach was attempted today. The patient was referred to the case management team for assistance with care management and care coordination.    Follow Up Plan: A HIPAA compliant phone message was left for the patient providing contact information and requesting a return call.    [name] RN, BSN [place]  [employer] manager

## 2022-08-16 NOTE — Patient Instructions (Signed)
Visit Information  Ms. [name]  - as a part of your Medicaid benefit, you are eligible for care management and care coordination services at no cost or copay. I was unable to reach you by phone today but would be happy to help you with your health related needs. Please feel free to call me @ [phone].   A member of the Managed Medicaid care management team will reach out to you again over the next 14 days.   [name] RN, BSN [place]  [employer] manager

## 2022-08-17 DIAGNOSIS — M170 Bilateral primary osteoarthritis of knee: Secondary | ICD-10-CM | POA: Diagnosis not present

## 2022-08-18 DIAGNOSIS — M170 Bilateral primary osteoarthritis of knee: Secondary | ICD-10-CM | POA: Diagnosis not present

## 2022-08-19 ENCOUNTER — Other Ambulatory Visit (HOSPITAL_COMMUNITY): Payer: Self-pay

## 2022-08-19 DIAGNOSIS — M170 Bilateral primary osteoarthritis of knee: Secondary | ICD-10-CM | POA: Diagnosis not present

## 2022-08-20 ENCOUNTER — Inpatient Hospital Stay: Payer: Medicaid Other | Attending: Adult Health | Admitting: Gynecologic Oncology

## 2022-08-20 DIAGNOSIS — D259 Leiomyoma of uterus, unspecified: Secondary | ICD-10-CM | POA: Insufficient documentation

## 2022-08-20 DIAGNOSIS — Z853 Personal history of malignant neoplasm of breast: Secondary | ICD-10-CM | POA: Insufficient documentation

## 2022-08-20 DIAGNOSIS — Z793 Long term (current) use of hormonal contraceptives: Secondary | ICD-10-CM | POA: Insufficient documentation

## 2022-08-20 DIAGNOSIS — M170 Bilateral primary osteoarthritis of knee: Secondary | ICD-10-CM | POA: Diagnosis not present

## 2022-08-20 DIAGNOSIS — N8502 Endometrial intraepithelial neoplasia [EIN]: Secondary | ICD-10-CM | POA: Insufficient documentation

## 2022-08-20 DIAGNOSIS — N939 Abnormal uterine and vaginal bleeding, unspecified: Secondary | ICD-10-CM | POA: Insufficient documentation

## 2022-08-20 DIAGNOSIS — F17210 Nicotine dependence, cigarettes, uncomplicated: Secondary | ICD-10-CM | POA: Insufficient documentation

## 2022-08-21 DIAGNOSIS — M170 Bilateral primary osteoarthritis of knee: Secondary | ICD-10-CM | POA: Diagnosis not present

## 2022-08-22 DIAGNOSIS — M170 Bilateral primary osteoarthritis of knee: Secondary | ICD-10-CM | POA: Diagnosis not present

## 2022-08-23 DIAGNOSIS — M170 Bilateral primary osteoarthritis of knee: Secondary | ICD-10-CM | POA: Diagnosis not present

## 2022-08-24 ENCOUNTER — Other Ambulatory Visit: Payer: Self-pay

## 2022-08-24 DIAGNOSIS — M170 Bilateral primary osteoarthritis of knee: Secondary | ICD-10-CM | POA: Diagnosis not present

## 2022-08-24 NOTE — Patient Outreach (Signed)
Care Coordination  08/24/2022  [name] 09-14-1961 [id number]  BSW spoke with patient. She stated she had just taken some medication and was getting ready to go to sleep and asked for a telephone call later.   Mickel Fuchs, BSW, [place] Managed Medicaid Team  [phone]

## 2022-08-25 ENCOUNTER — Telehealth: Payer: Self-pay

## 2022-08-25 DIAGNOSIS — M170 Bilateral primary osteoarthritis of knee: Secondary | ICD-10-CM | POA: Diagnosis not present

## 2022-08-25 NOTE — Telephone Encounter (Signed)
Returning [name]' call to reschedule her appointment from 08-20-22. [name] stated that she began with vaginal bleeding 2 weeks ago. She is wearing 3 panty liners at a time.  Not soaking through liner. She changes the liners bid. No clots noted  She had some mild abdominal cramping yesterday. Afebrile. No chills noted.  [name] states that she continues to take the medroxyprogesterone 20 mg daily as directed.  Pt not able to come tomorrow due to transportation. Scheduled for follow up with Dr. Berline Lopes on 09-09-22.  Will have above information reviewed with Dr. Emilio Math to see if any changes need to be made to her medroxyprogesterone and call her back today or tomorrow. Pt verbalized understanding.

## 2022-08-25 NOTE — Patient Instructions (Signed)
Visit Information  [name]  - as a part of your Medicaid benefit, you are eligible for care management and care coordination services at no cost or copay. I was unable to reach you by phone today but would be happy to help you with your health related needs. Please feel free to call me @ [phone].   A member of the Managed Medicaid care management team will reach out to you again over the next 7 days.   Mickel Fuchs, BSW, [place] Managed Medicaid Team  [phone]

## 2022-08-25 NOTE — Telephone Encounter (Signed)
Told [name] that Dr. Berline Lopes said that she needs to see cardiology to get clearance for surgery..  She needs to keep her cardiology appointment on 09-03-22. Her appointment with Dr. Berline Lopes on 09-09-22 would need to be cancelled if she does not keep her cardiology appointment per Dr. Berline Lopes. Pt verbalized understanding.

## 2022-08-25 NOTE — Telephone Encounter (Signed)
[name] called and stated that she wanted to let Dr. Berline [name] know that she is ready to have surgery.  Told her that she can discuss this at her visit on 09-09-22. Pt verbalized understanding.

## 2022-08-26 DIAGNOSIS — M170 Bilateral primary osteoarthritis of knee: Secondary | ICD-10-CM | POA: Diagnosis not present

## 2022-08-27 ENCOUNTER — Other Ambulatory Visit: Payer: Self-pay | Admitting: *Deleted

## 2022-08-27 ENCOUNTER — Other Ambulatory Visit (HOSPITAL_COMMUNITY): Payer: Self-pay

## 2022-08-27 DIAGNOSIS — M170 Bilateral primary osteoarthritis of knee: Secondary | ICD-10-CM | POA: Diagnosis not present

## 2022-08-27 NOTE — Patient Outreach (Signed)
Medicaid Managed Care   Nurse Care Manager Note  08/27/2022 Name:  [name] MRN:  [id number] DOB:  11-06-1961  [name] is an 61 y.o. year old female who is a primary patient of Orvis Brill, DO.  The [facility] Managed Care Coordination team was consulted for assistance with:    CHF  [name] was given information about Medicaid Managed Care Coordination team services today. [name] agreed to services and verbal consent obtained.  Engaged with patient by telephone for follow up visit in response to provider referral for case management and/or care coordination services.   Assessments/Interventions:  Review of past medical history, allergies, medications, health status, including review of consultants reports, laboratory and other test data, was performed as part of comprehensive evaluation and provision of chronic care management services.  SDOH (Social Determinants of Health) assessments and interventions performed: SDOH Interventions    Flowsheet Row Patient Outreach Telephone from 08/27/2022 in [place] Patient Outreach Telephone from 07/16/2022 in [place] Patient Outreach Telephone from 09/21/2021 in [place] Patient Outreach Telephone from 07/28/2021 in [place] Interventions      Food Insecurity Interventions -- Intervention Not Indicated Other (Comment) Intervention Not Indicated  Housing Interventions -- Intervention Not Indicated -- Intervention Not Indicated  Transportation Interventions Other (Comment)  [Provided patient with [facility] medical transportation [phone] Intervention Not Indicated -- Intervention Not Indicated  Financial Strain Interventions -- -- -- Intervention Not Indicated  Physical Activity Interventions -- -- -- Intervention Not Indicated  Stress  Interventions -- -- -- Intervention Not Indicated  Social Connections Interventions -- -- -- Intervention Not Indicated       Care Plan  No Known Allergies  Medications Reviewed Today     Reviewed by Melissa Montane, RN (Registered Nurse) on 08/27/22 at 1034  Med List Status: <None>   Medication Order Taking? Sig Documenting Provider Last Dose Status Informant  acetaminophen (TYLENOL) 325 MG tablet [id number] Yes Take 2 tablets (650 mg total) by mouth every 6 (six) hours as needed for mild pain, moderate pain or headache. Ezequiel Essex, MD Taking Active   allopurinol (ZYLOPRIM) 300 MG tablet [id number] No Take 1 tablet (300 mg total) by mouth daily.  Patient not taking: Reported on 08/27/2022   [name], MD Not Taking Active Self  ascorbic acid (VITAMIN C) 500 MG tablet [id number] Yes Take 500 mg by mouth daily. [provider] Taking Active Self  aspirin EC 81 MG tablet [id number] Yes Take 81 mg by mouth daily. [provider] Taking Active Self  atorvastatin (LIPITOR) 40 MG tablet [id number] Yes TAKE 1 TABLET BY MOUTH DAILY AT 6 PM.  Patient taking differently: Take 40 mg by mouth daily.   [name], MD Taking Active Self  Baclofen 5 MG TABS [id number] No Take 1 tablet (5 mg) by mouth 2 (two) times daily as needed for muscle spasms.  Patient not taking: Reported on 08/27/2022   Ezequiel Essex, MD Not Taking Active   carvedilol (COREG) 25 MG tablet [id number] No Take 1 tablet by mouth 2 times daily with a meal.  Patient not taking: Reported on 08/27/2022   Ezequiel Essex, MD Not Taking Active   cholecalciferol (VITAMIN D3) 25 MCG (1000 UNIT) tablet [id number] Yes Take 1,000 Units by mouth daily. [provider] Taking Active Self  empagliflozin (JARDIANCE) 10 MG TABS tablet [id number] No Take 1  tablet by mouth daily.  Patient not taking: Reported on 08/27/2022   Ezequiel Essex, MD Not Taking Active   ezetimibe (ZETIA) 10 MG tablet [id number] No Take 1 tablet  by mouth daily.  Patient not taking: Reported on 08/27/2022   Ezequiel Essex, MD Not Taking Active     Discontinued 03/10/12 1404 (Error)   fluticasone (FLONASE) 50 MCG/ACT nasal spray [id number] Yes Place 1 spray into both nostrils daily. Ezequiel Essex, MD Taking Active   furosemide (LASIX) 20 MG tablet [id number] No Take 1 tablet (20 mg total) by mouth daily.  Patient not taking: Reported on 08/27/2022   Ezequiel Essex, MD Not Taking Active            Med Note ([name] A   Fri Aug 27, 2022 10:30 AM) Ran out  isosorbide mononitrate (IMDUR) 60 MG 24 hr tablet [id number] Yes Take 1 tablet (60 mg total) by mouth at bedtime. [name], MD Taking Active Self  medroxyPROGESTERone (PROVERA) 10 MG tablet [id number] Yes Take 2 tablets (20 mg total) by mouth daily. Joylene John D, NP Taking Active Self  methadone (DOLOPHINE) 5 MG tablet [id number] Yes Take 3 tablets by mouth every 8  hours as needed Iruku, Arletha Pili, MD Taking Active   nitroGLYCERIN (NITROSTAT) 0.4 MG SL tablet [id number] Yes Place 1 tablet (0.4 mg total) under the tongue every 5 (five) minutes as needed for chest pain. [name], MD Taking Active Self  potassium chloride SA (KLOR-CON M20) 20 MEQ tablet [id number] Yes Take 1 tablet (20 mEq total) by mouth 3 (three) times daily. 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   Tension headache 03/31/2022   Chest pain 03/30/2022   Hypomagnesemia 03/30/2022    Class: Acute   Constipation 03/30/2022   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   BMI 40.0-44.9,  adult ([place]) 10/26/2021   COVID-19 vaccine administered 07/27/2021   Prediabetes 01/01/2021   Concern about ear disease without diagnosis 01/01/2021   Neck mass 11/05/2020   Arthritis of right knee 06/11/2020   Acquired absence of both breasts 04/01/2020   Bilateral leg edema 06/21/2019   Dysfunctional uterine bleeding 04/30/2019   Right leg pain 09/13/2018   Acquired trigger finger of right ring finger 05/29/2018   Hyperlipidemia LDL goal <70 02/08/2018   History of ST elevation myocardial infarction (STEMI) 12/01/2017   Acute ST segment elevation myocardial infarction ([place]) 12/01/2017   Endometrial hyperplasia, simple 11/08/2017   Unilateral primary osteoarthritis, right knee 10/27/2017   Essential hypertension 04/17/2016   CAD (coronary artery disease) 04/17/2016   Shortness of breath    Thrombocytosis 03/10/2015   Tobacco abuse 12/02/2014   Cancer of overlapping sites of right female breast ([place]) 09/16/2014   Breast cancer of upper-outer quadrant of left female breast ([place]) 09/16/2014   Hot flashes due to tamoxifen 05/09/2014   Lymphedema of arm 11/08/2013   Hypokalemia 03/31/2012   Atherosclerosis of aorta ([facility])     Conditions to be addressed/monitored per PCP order:  CHF  Care Plan : RN Care Manager Plan of  Care  Updates made by Melissa Montane, RN since 08/27/2022 12:00 AM     Problem: Health Mangement needs related to CHF      Long-Range Goal: Development of Plan of Care to address Health Mangement needs related to CHF   Start Date: 07/16/2022  Expected End Date: 09/28/2022  Priority: High  Note:   Current Barriers:  Chronic Disease Management support and education needs related to CHF-Ms. Donati reports needing 4 of her medications and will not be able to get them until 09/03/22. Recent weight 227#, down from 243#. She is working on weight loss by walking every day and eating more fruit.   RNCM Clinical Goal(s):  Patient will verbalize understanding of plan for  management of CHF as evidenced by Patient reports take all medications exactly as prescribed and will call provider for medication related questions as evidenced by patient reports and EMR documentation    attend all scheduled medical appointments: 09/03/22 with Cardiology, 09/09/22 with OB/GYN and scheduling PCP visit as evidenced by provider documentation           Interventions: Inter-disciplinary care team collaboration (see longitudinal plan of care) Evaluation of current treatment plan related to  self management and patient's adherence to plan as established by provider   Heart Failure Interventions:  (Status: Goal on Track (progressing): YES.)  Long Term Goal  Wt Readings from Last 3 Encounters:  07/09/22 243 lb 14.4 oz (110.6 kg)  06/23/22 245 lb 11.2 oz (111.4 kg)  06/09/22 242 lb (109.8 kg)   Basic overview and discussion of pathophysiology of Heart Failure reviewed Reviewed Heart Failure Action Plan in depth and provided written copy Assessed need for readable accurate scales in home Advised patient to weigh each morning after emptying bladder Discussed importance of daily weight and advised patient to weigh and record daily Discussed the importance of keeping all appointments with provider Provided patient with education about the role of exercise in the management of heart failure Assessed social determinant of health barriers Collaborated with PCP for needed scale Advised patient to continue to work on cutting back on smoking, provided 1-800-QUIT-NOW Assisted patient with calling [facility], establishing an account and requesting needed refills to be delivered Advised patient to attend upcoming Cardiology appointment on 09/03/22 Provided patient [facility] medical transportation [phone], advised patient to call today to arrange transportation to next two appointments Advised patient to schedule PCP visit to discuss medications and request a scale    Patient Goals/Self-Care Activities: Take medications as prescribed   Attend all scheduled provider appointments Call provider office for new concerns or questions  Work with the social worker to address care coordination needs and will continue to work with the clinical team to address health care and disease management related needs call office if I gain more than 2 pounds in one day or 5 pounds in one week track weight in diary use salt in moderation weigh myself daily follow rescue plan if symptoms flare-up       Follow Up:  Patient agrees to Care Plan and Follow-up.  Plan: The Managed Medicaid care management team will reach out to the patient again over the next 30 days.  Date/time of next scheduled RN care management/care coordination outreach:  09/28/22 @ 11:15am  [name] RN, BSN [place]  [employer] manager

## 2022-08-27 NOTE — Patient Instructions (Signed)
Visit Information  Ms. Shoaf was given information about Medicaid Managed Care team care coordination services as a part of their [place] Medicaid benefit. [name] verbally consented to engagement with the [facility] Managed Care team.   If you are experiencing a medical emergency, please call 911 or report to your local emergency department or urgent care.   If you have a non-emergency medical problem during routine business hours, please contact your provider's office and ask to speak with a nurse.   For questions related to your [facility], please call: [phone] or visit the homepage here: [email or web address]  If you would like to schedule transportation through your [facility] please call the following number at least 2 days in advance of your appointment: [phone]   Rides for urgent appointments can also be made after hours by calling Member Services.  Call the [place] at [phone], at any time, 24 hours a day, 7 days a week. If you are in danger or need immediate medical attention call 911.  If you would like help to quit smoking, call 1-800-QUIT-NOW 302-619-3423) OR Espaol: 1-855-Djelo-Ya (7-425-956-3875) o para ms informacin haga clic aqu or Text READY to 200-400 to register via text  Ms. Dueitt,   Please see education materials related to CHF provided as print materials.   The patient verbalized understanding of instructions, educational materials, and care plan provided today and agreed to receive a mailed copy of patient instructions, educational materials, and care plan.   Telephone follow up appointment with Managed Medicaid care management team member scheduled for:09/28/22 @ 11:15am  [name] RN, BSN [place] RN Care Coordinator   Following is a copy of your  plan of care:  Care Plan : RN Care Manager Plan of Care  Updates made by Melissa Montane, RN since 08/27/2022 12:00 AM     Problem: Health Mangement needs related to CHF      Long-Range Goal: Development of Plan of Care to address Health Mangement needs related to CHF   Start Date: 07/16/2022  Expected End Date: 09/28/2022  Priority: High  Note:   Current Barriers:  Chronic Disease Management support and education needs related to CHF-Ms. Surman reports needing 4 of her medications and will not be able to get them until 09/03/22. Recent weight 227#, down from 243#. She is working on weight loss by walking every day and eating more fruit.   RNCM Clinical Goal(s):  Patient will verbalize understanding of plan for management of CHF as evidenced by Patient reports take all medications exactly as prescribed and will call provider for medication related questions as evidenced by patient reports and EMR documentation    attend all scheduled medical appointments: 09/03/22 with Cardiology, 09/09/22 with OB/GYN and scheduling PCP visit as evidenced by provider documentation           Interventions: Inter-disciplinary care team collaboration (see longitudinal plan of care) Evaluation of current treatment plan related to  self management and patient's adherence to plan as established by provider   Heart Failure Interventions:  (Status: Goal on Track (progressing): YES.)  Long Term Goal  Wt Readings from Last 3 Encounters:  07/09/22 243 lb 14.4 oz (110.6 kg)  06/23/22 245 lb 11.2 oz (111.4 kg)  06/09/22 242 lb (109.8 kg)   Basic overview and discussion of pathophysiology of Heart Failure reviewed Reviewed Heart Failure Action Plan in depth and provided written  copy Assessed need for readable accurate scales in home Advised patient to weigh each morning after emptying bladder Discussed importance of daily weight and advised patient to weigh and record daily Discussed the importance of keeping all  appointments with provider Provided patient with education about the role of exercise in the management of heart failure Assessed social determinant of health barriers Collaborated with PCP for needed scale Advised patient to continue to work on cutting back on smoking, provided 1-800-QUIT-NOW Assisted patient with calling [facility], establishing an account and requesting needed refills to be delivered Advised patient to attend upcoming Cardiology appointment on 09/03/22 Provided patient [facility] medical transportation [phone], advised patient to call today to arrange transportation to next two appointments Advised patient to schedule PCP visit to discuss medications and request a scale   Patient Goals/Self-Care Activities: Take medications as prescribed   Attend all scheduled provider appointments Call provider office for new concerns or questions  Work with the social worker to address care coordination needs and will continue to work with the clinical team to address health care and disease management related needs call office if I gain more than 2 pounds in one day or 5 pounds in one week track weight in diary use salt in moderation weigh myself daily follow rescue plan if symptoms flare-up

## 2022-08-28 DIAGNOSIS — M170 Bilateral primary osteoarthritis of knee: Secondary | ICD-10-CM | POA: Diagnosis not present

## 2022-08-29 DIAGNOSIS — M170 Bilateral primary osteoarthritis of knee: Secondary | ICD-10-CM | POA: Diagnosis not present

## 2022-08-30 ENCOUNTER — Other Ambulatory Visit (HOSPITAL_COMMUNITY): Payer: Self-pay

## 2022-08-30 DIAGNOSIS — M170 Bilateral primary osteoarthritis of knee: Secondary | ICD-10-CM | POA: Diagnosis not present
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
## 2022-08-31 DIAGNOSIS — M170 Bilateral primary osteoarthritis of knee: Secondary | ICD-10-CM | POA: Diagnosis not present

## 2022-09-01 DIAGNOSIS — M170 Bilateral primary osteoarthritis of knee: Secondary | ICD-10-CM | POA: Diagnosis not present

## 2022-09-02 DIAGNOSIS — M170 Bilateral primary osteoarthritis of knee: Secondary | ICD-10-CM | POA: Diagnosis not present

## 2022-09-03 ENCOUNTER — Other Ambulatory Visit (HOSPITAL_COMMUNITY): Payer: Self-pay

## 2022-09-03 ENCOUNTER — Ambulatory Visit: Payer: Medicaid Other | Attending: Physician Assistant | Admitting: Nurse Practitioner

## 2022-09-03 ENCOUNTER — Encounter: Payer: Self-pay | Admitting: Nurse Practitioner

## 2022-09-03 VITALS — BP 144/90 | HR 70 | Ht 65.0 in | Wt 237.2 lb

## 2022-09-03 DIAGNOSIS — E785 Hyperlipidemia, unspecified: Secondary | ICD-10-CM

## 2022-09-03 DIAGNOSIS — M170 Bilateral primary osteoarthritis of knee: Secondary | ICD-10-CM | POA: Diagnosis not present

## 2022-09-03 DIAGNOSIS — I5033 Acute on chronic diastolic (congestive) heart failure: Secondary | ICD-10-CM

## 2022-09-03 DIAGNOSIS — Z01818 Encounter for other preprocedural examination: Secondary | ICD-10-CM | POA: Diagnosis not present

## 2022-09-03 DIAGNOSIS — I10 Essential (primary) hypertension: Secondary | ICD-10-CM

## 2022-09-03 DIAGNOSIS — I2510 Atherosclerotic heart disease of native coronary artery without angina pectoris: Secondary | ICD-10-CM | POA: Diagnosis not present

## 2022-09-03 DIAGNOSIS — Z720 Tobacco use: Secondary | ICD-10-CM | POA: Diagnosis not present

## 2022-09-03 MED ORDER — FUROSEMIDE 20 MG PO TABS
20.0000 mg | ORAL_TABLET | Freq: Every day | ORAL | 2 refills | Status: DC
Start: 2022-09-03 — End: 2022-12-06
  Filled 2022-09-03: qty 90, 60d supply, fill #0

## 2022-09-03 NOTE — Patient Instructions (Signed)
Medication Instructions:   TAKE AN EXTRA LASIX 20 MG IF WEIGHT GAIN OF 3 LBS IN 24 HOURS OR 5 LBS IN A WEEK   *If you need a refill on your cardiac medications before your next appointment, please call your pharmacy*   Lab Work: NONE ORDERED  TODAY    If you have labs (blood work) drawn today and your tests are completely normal, you will receive your results only by: [place] (if you have MyChart) OR A paper copy in the mail If you have any lab test that is abnormal or we need to change your treatment, we will call you to review the results.   Testing/Procedures: NONE ORDERED  TODAY     Follow-Up: At [facility], you and your health needs are our priority.  As part of our continuing mission to provide you with exceptional heart care, we have created designated Provider Care Teams.  These Care Teams include your primary Cardiologist (physician) and Advanced Practice Providers (APPs -  Physician Assistants and Nurse Practitioners) who all work together to provide you with the care you need, when you need it.  We recommend signing up for the patient portal called "MyChart".  Sign up information is provided on this After Visit Summary.  MyChart is used to connect with patients for Virtual Visits (Telemedicine).  Patients are able to view lab/test results, encounter notes, upcoming appointments, etc.  Non-urgent messages can be sent to your provider as well.   To learn more about what you can do with MyChart, go to [email or web address].    Your next appointment:   3 month(s)  The format for your next appointment:   In Person  Provider:   Sinclair Grooms, MD  / Ambrose Pancoast NP    Other Instructions   Important Information About [place]

## 2022-09-04 DIAGNOSIS — M170 Bilateral primary osteoarthritis of knee: Secondary | ICD-10-CM | POA: Diagnosis not present

## 2022-09-05 DIAGNOSIS — M170 Bilateral primary osteoarthritis of knee: Secondary | ICD-10-CM | POA: Diagnosis not present

## 2022-09-06 ENCOUNTER — Other Ambulatory Visit: Payer: Self-pay | Admitting: Hematology and Oncology

## 2022-09-06 ENCOUNTER — Other Ambulatory Visit: Payer: Self-pay | Admitting: Interventional Cardiology

## 2022-09-06 ENCOUNTER — Other Ambulatory Visit (HOSPITAL_COMMUNITY): Payer: Self-pay

## 2022-09-06 DIAGNOSIS — C50412 Malignant neoplasm of upper-outer quadrant of left female breast: Secondary | ICD-10-CM

## 2022-09-06 DIAGNOSIS — M170 Bilateral primary osteoarthritis of knee: Secondary | ICD-10-CM | POA: Diagnosis not present

## 2022-09-07 ENCOUNTER — Other Ambulatory Visit (HOSPITAL_COMMUNITY): Payer: Self-pay

## 2022-09-07 DIAGNOSIS — M170 Bilateral primary osteoarthritis of knee: Secondary | ICD-10-CM | POA: Diagnosis not present

## 2022-09-08 ENCOUNTER — Other Ambulatory Visit (HOSPITAL_COMMUNITY): Payer: Self-pay

## 2022-09-08 ENCOUNTER — Other Ambulatory Visit: Payer: Self-pay | Admitting: Hematology and Oncology

## 2022-09-08 DIAGNOSIS — M170 Bilateral primary osteoarthritis of knee: Secondary | ICD-10-CM | POA: Diagnosis not present

## 2022-09-08 DIAGNOSIS — C50412 Malignant neoplasm of upper-outer quadrant of left female breast: Secondary | ICD-10-CM

## 2022-09-08 MED ORDER — METHADONE HCL 5 MG PO TABS
15.0000 mg | ORAL_TABLET | Freq: Three times a day (TID) | ORAL | 0 refills | Status: DC | PRN
  Filled 2022-09-08 – 2022-09-09 (×2): qty 270, 30d supply, fill #0
  Filled ????-??-??: fill #0

## 2022-09-09 ENCOUNTER — Inpatient Hospital Stay (HOSPITAL_BASED_OUTPATIENT_CLINIC_OR_DEPARTMENT_OTHER): Payer: Medicaid Other | Admitting: Gynecologic Oncology

## 2022-09-09 ENCOUNTER — Other Ambulatory Visit: Payer: Self-pay

## 2022-09-09 ENCOUNTER — Encounter: Payer: Self-pay | Admitting: Gynecologic Oncology

## 2022-09-09 ENCOUNTER — Other Ambulatory Visit (HOSPITAL_COMMUNITY): Payer: Self-pay

## 2022-09-09 VITALS — BP 148/93 | HR 79 | Temp 98.4°F | Resp 18 | Ht 64.96 in | Wt 235.0 lb

## 2022-09-09 DIAGNOSIS — F17210 Nicotine dependence, cigarettes, uncomplicated: Secondary | ICD-10-CM | POA: Diagnosis not present

## 2022-09-09 DIAGNOSIS — N939 Abnormal uterine and vaginal bleeding, unspecified: Secondary | ICD-10-CM | POA: Diagnosis not present

## 2022-09-09 DIAGNOSIS — Z79890 Hormone replacement therapy (postmenopausal): Secondary | ICD-10-CM | POA: Diagnosis not present

## 2022-09-09 DIAGNOSIS — N8502 Endometrial intraepithelial neoplasia [EIN]: Secondary | ICD-10-CM

## 2022-09-09 DIAGNOSIS — I2510 Atherosclerotic heart disease of native coronary artery without angina pectoris: Secondary | ICD-10-CM

## 2022-09-09 DIAGNOSIS — E669 Obesity, unspecified: Secondary | ICD-10-CM | POA: Diagnosis not present

## 2022-09-09 DIAGNOSIS — D259 Leiomyoma of uterus, unspecified: Secondary | ICD-10-CM | POA: Diagnosis not present

## 2022-09-09 DIAGNOSIS — M170 Bilateral primary osteoarthritis of knee: Secondary | ICD-10-CM | POA: Diagnosis not present

## 2022-09-09 DIAGNOSIS — Z853 Personal history of malignant neoplasm of breast: Secondary | ICD-10-CM | POA: Diagnosis not present

## 2022-09-09 DIAGNOSIS — Z720 Tobacco use: Secondary | ICD-10-CM

## 2022-09-09 DIAGNOSIS — Z793 Long term (current) use of hormonal contraceptives: Secondary | ICD-10-CM | POA: Diagnosis not present

## 2022-09-09 NOTE — Patient Instructions (Addendum)
It was good to see you today.  I will call you with your endometrial biopsy results once I get them back, likely next week.  I will see you in about a month for your preoperative visit.  We will do this a little bit closer to the date of surgery.  We will tentatively plan for surgery at [facility] with Dr. Jeral Pinch on October 13, 2022. We will see you back in the office closer to the date for a preop appointment with Dr. Berline Lopes and Joylene John NP to discuss the instrcutions for before and after surgery.  You may also receive a phone call from the hospital to arrange for a pre-op appointment there as well. Usually both appointments can be combined on the same day.
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
## 2022-09-10 DIAGNOSIS — M170 Bilateral primary osteoarthritis of knee: Secondary | ICD-10-CM | POA: Diagnosis not present

## 2022-09-11 ENCOUNTER — Other Ambulatory Visit (HOSPITAL_COMMUNITY): Payer: Self-pay

## 2022-09-11 DIAGNOSIS — M170 Bilateral primary osteoarthritis of knee: Secondary | ICD-10-CM | POA: Diagnosis not present

## 2022-09-12 DIAGNOSIS — M170 Bilateral primary osteoarthritis of knee: Secondary | ICD-10-CM | POA: Diagnosis not present

## 2022-09-13 DIAGNOSIS — M170 Bilateral primary osteoarthritis of knee: Secondary | ICD-10-CM | POA: Diagnosis not present

## 2022-09-14 DIAGNOSIS — M170 Bilateral primary osteoarthritis of knee: Secondary | ICD-10-CM | POA: Diagnosis not present

## 2022-09-15 DIAGNOSIS — M170 Bilateral primary osteoarthritis of knee: Secondary | ICD-10-CM | POA: Diagnosis not present

## 2022-09-16 DIAGNOSIS — M170 Bilateral primary osteoarthritis of knee: Secondary | ICD-10-CM | POA: Diagnosis not present

## 2022-09-16 LAB — SURGICAL PATHOLOGY

## 2022-09-17 DIAGNOSIS — F331 Major depressive disorder, recurrent, moderate: Secondary | ICD-10-CM | POA: Diagnosis not present

## 2022-09-17 DIAGNOSIS — M170 Bilateral primary osteoarthritis of knee: Secondary | ICD-10-CM | POA: Diagnosis not present

## 2022-09-18 DIAGNOSIS — M170 Bilateral primary osteoarthritis of knee: Secondary | ICD-10-CM | POA: Diagnosis not present

## 2022-09-19 DIAGNOSIS — M170 Bilateral primary osteoarthritis of knee: Secondary | ICD-10-CM | POA: Diagnosis not present

## 2022-09-20 DIAGNOSIS — M170 Bilateral primary osteoarthritis of knee: Secondary | ICD-10-CM | POA: Diagnosis not present

## 2022-09-21 DIAGNOSIS — M170 Bilateral primary osteoarthritis of knee: Secondary | ICD-10-CM | POA: Diagnosis not present

## 2022-09-22 DIAGNOSIS — M170 Bilateral primary osteoarthritis of knee: Secondary | ICD-10-CM | POA: Diagnosis not present

## 2022-09-23 DIAGNOSIS — M170 Bilateral primary osteoarthritis of knee: Secondary | ICD-10-CM | POA: Diagnosis not present

## 2022-09-24 DIAGNOSIS — M170 Bilateral primary osteoarthritis of knee: Secondary | ICD-10-CM | POA: Diagnosis not present

## 2022-09-25 DIAGNOSIS — M170 Bilateral primary osteoarthritis of knee: Secondary | ICD-10-CM | POA: Diagnosis not present

## 2022-09-26 DIAGNOSIS — M170 Bilateral primary osteoarthritis of knee: Secondary | ICD-10-CM | POA: Diagnosis not present

## 2022-09-27 DIAGNOSIS — M170 Bilateral primary osteoarthritis of knee: Secondary | ICD-10-CM | POA: Diagnosis not present

## 2022-09-28 ENCOUNTER — Other Ambulatory Visit: Payer: Self-pay | Admitting: *Deleted

## 2022-09-28 DIAGNOSIS — M170 Bilateral primary osteoarthritis of knee: Secondary | ICD-10-CM | POA: Diagnosis not present

## 2022-09-28 NOTE — Patient Outreach (Signed)
Medicaid Managed Care   Nurse Care Manager Note  09/28/2022 Name:  [name] MRN:  [id number] DOB:  1961/10/28  [name] is an 61 y.o. year old female who is a primary patient of Orvis Brill, DO.  The [facility] Managed Care Coordination team was consulted for assistance with:    CHF  Ms. Furney was given information about Medicaid Managed Care Coordination team services today. [name] agreed to services and verbal consent obtained.  Engaged with patient by telephone for follow up visit in response to provider referral for case management and/or care coordination services.   Assessments/Interventions:  Review of past medical history, allergies, medications, health status, including review of consultants reports, laboratory and other test data, was performed as part of comprehensive evaluation and provision of chronic care management services.  SDOH (Social Determinants of Health) assessments and interventions performed: SDOH Interventions    Flowsheet Row Patient Outreach Telephone from 08/27/2022 in [place] Patient Outreach Telephone from 07/16/2022 in [place] Patient Outreach Telephone from 09/21/2021 in [place] Patient Outreach Telephone from 07/28/2021 in [place] Interventions      Food Insecurity Interventions -- Intervention Not Indicated Other (Comment) Intervention Not Indicated  Housing Interventions -- Intervention Not Indicated -- Intervention Not Indicated  Transportation Interventions Other (Comment)  [Provided patient with [facility] medical transportation [phone] Intervention Not Indicated -- Intervention Not Indicated  Financial Strain Interventions -- -- -- Intervention Not Indicated  Physical Activity Interventions -- -- -- Intervention Not Indicated  Stress  Interventions -- -- -- Intervention Not Indicated  Social Connections Interventions -- -- -- Intervention Not Indicated       Care Plan  No Known Allergies  Medications Reviewed Today     Reviewed by Melissa Montane, RN (Registered Nurse) on 09/28/22 at 1135  Med List Status: <None>   Medication Order Taking? Sig Documenting Provider Last Dose Status Informant  acetaminophen (TYLENOL) 325 MG tablet [id number] Yes Take 2 tablets (650 mg total) by mouth every 6 (six) hours as needed for mild pain, moderate pain or headache. Ezequiel Essex, MD Taking Active   allopurinol (ZYLOPRIM) 300 MG tablet [id number] Yes Take 1 tablet (300 mg total) by mouth daily. [name], MD Taking Active Self  ascorbic acid (VITAMIN C) 500 MG tablet [id number] Yes Take 500 mg by mouth daily. [provider] Taking Active Self  aspirin EC 81 MG tablet [id number] Yes Take 81 mg by mouth daily. [provider] Taking Active Self  atorvastatin (LIPITOR) 40 MG tablet [id number] Yes TAKE 1 TABLET BY MOUTH DAILY AT 6 PM.  Patient taking differently: Take 40 mg by mouth daily.   [name], MD Taking Active Self  Baclofen 5 MG TABS [id number] No Take 1 tablet (5 mg) by mouth 2 (two) times daily as needed for muscle spasms.  Patient not taking: Reported on 09/28/2022   Ezequiel Essex, MD Not Taking Active   carvedilol (COREG) 25 MG tablet [id number] Yes Take 1 tablet by mouth 2 times daily with a meal. Ezequiel Essex, MD Taking Active   cholecalciferol (VITAMIN D3) 25 MCG (1000 UNIT) tablet [id number] Yes Take 1,000 Units by mouth daily. [provider] Taking Active Self  empagliflozin (JARDIANCE) 10 MG TABS tablet [id number] Yes Take 1 tablet by mouth daily. Ezequiel Essex, MD Taking Active   ezetimibe (ZETIA) 10 MG tablet [id number] Yes Take 1  tablet by mouth daily. Ezequiel Essex, MD Taking Active     Discontinued 03/10/12 1404 (Error)   fluticasone (FLONASE) 50 MCG/ACT nasal spray  [id number] Yes Place 1 spray into both nostrils daily. Ezequiel Essex, MD Taking Active   furosemide (LASIX) 20 MG tablet [id number] Yes Take 1 tablet (20 mg total) by mouth daily. TAKE AN EXTRA 20 MG IF WGT GAIN 3 LBS IN 24 HOURS OR 5 LBS IN A WEEK Dick, Junius Creamer., NP Taking Active   isosorbide mononitrate (IMDUR) 60 MG 24 hr tablet [id number] Yes Take 1 tablet (60 mg total) by mouth at bedtime. [name], MD Taking Active Self  medroxyPROGESTERone (PROVERA) 10 MG tablet [id number] Yes Take 2 tablets (20 mg total) by mouth daily. Joylene John D, NP Taking Active Self  methadone (DOLOPHINE) 5 MG tablet [id number] Yes Take 3 tablets by mouth every 8  hours as needed Iruku, Arletha Pili, MD Taking Active   nitroGLYCERIN (NITROSTAT) 0.4 MG SL tablet [id number] Yes Place 1 tablet (0.4 mg total) under the tongue every 5 (five) minutes as needed for chest pain. [name], MD Taking Active Self  potassium chloride SA (KLOR-CON M20) 20 MEQ tablet [id number] Yes Take 1 tablet (20 mEq total) by mouth 3 (three) times daily. 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   Tension headache 03/31/2022   Chest pain 03/30/2022   Hypomagnesemia 03/30/2022    Class: Acute   Constipation 03/30/2022   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   BMI 40.0-44.9, adult ([place]) 10/26/2021   COVID-19 vaccine administered 07/27/2021   Prediabetes 01/01/2021   Concern about ear disease without diagnosis 01/01/2021   Neck mass 11/05/2020   Arthritis of right knee 06/11/2020   Acquired absence of  both breasts 04/01/2020   Bilateral leg edema 06/21/2019   Dysfunctional uterine bleeding 04/30/2019   Right leg pain 09/13/2018   Acquired trigger finger of right ring finger 05/29/2018   Hyperlipidemia LDL goal <70 02/08/2018   History of ST elevation myocardial infarction (STEMI) 12/01/2017   Acute ST segment elevation myocardial infarction ([place]) 12/01/2017   Endometrial hyperplasia, simple 11/08/2017   Unilateral primary osteoarthritis, right knee 10/27/2017   Essential hypertension 04/17/2016   CAD (coronary artery disease) 04/17/2016   Shortness of breath    Thrombocytosis 03/10/2015   Tobacco abuse 12/02/2014   Cancer of overlapping sites of right female breast ([place]) 09/16/2014   Breast cancer of upper-outer quadrant of left female breast ([place]) 09/16/2014   Hot flashes due to tamoxifen 05/09/2014   Lymphedema of arm 11/08/2013   Hypokalemia 03/31/2012   Atherosclerosis of aorta (HCC)     Conditions to be addressed/monitored per PCP order:  CHF  Care Plan : RN Care Manager Plan of Care  Updates made by Melissa Montane, RN since 09/28/2022 12:00 AM     Problem: Health Mangement needs related to CHF      Long-Range Goal: Development of Plan of Care to address Health Mangement needs related to CHF   Start Date: 07/16/2022  Expected End Date: 11/27/2022  Priority: High  Note:   Current Barriers:  Chronic Disease Management support and education needs related to CHF-Ms. Marlar reports needing a vehicle. She has used transportation provided by [facility]. She will have surgery on 10/13/22 for a hysterectomy.  RNCM Clinical Goal(s):  Patient will verbalize understanding of plan for management of CHF as evidenced by Patient reports take all medications exactly as prescribed and will call provider for medication related questions as evidenced by patient reports and EMR documentation    attend all scheduled medical appointments: 10/08/22 with OB/GYN and 10/19 with PCP as evidenced by  provider documentation           Interventions: Inter-disciplinary care team collaboration (see longitudinal plan of care) Evaluation of current treatment plan related to  self management and patient's adherence to plan as established by provider Referral to BSW, scheduled on 09/30/22 at 3pm for transportation resources   Heart Failure Interventions:  (Status: Goal on Track (progressing): YES.)  Long Term Goal  Wt Readings from Last 3 Encounters:  09/09/22 235 lb (106.6 kg)  09/03/22 237 lb 3.2 oz (107.6 kg)  07/09/22 243 lb 14.4 oz (110.6 kg)   Provided education on low sodium diet Discussed importance of daily weight and advised patient to weigh and record daily Reviewed role of diuretics in prevention of fluid overload and management of heart failure Discussed the importance of keeping all appointments with provider Advised patient to continue to work on cutting back on smoking, provided 1-800-QUIT-NOW   Patient Goals/Self-Care Activities: Take medications as prescribed   Attend all scheduled provider appointments Call provider office for new concerns or questions  Work with the social worker to address care coordination needs and will continue to work with the clinical team to address health care and disease management related needs call office if I gain more than 2 pounds in one day or 5 pounds in one week track weight in diary use salt in moderation weigh myself daily follow rescue plan if symptoms flare-up       Follow Up:  Patient agrees to Care Plan and Follow-up.  Plan: The Managed Medicaid care management team will reach out to the patient again over the next 30 days.  Date/time of next scheduled RN care management/care coordination outreach:  10/28/22 @ 11:15 am  [name] RN, [place] RN Care Coordinator

## 2022-09-28 NOTE — Patient Instructions (Signed)
Visit Information  [name] was given information about Medicaid Managed Care team care coordination services as a part of their [place] Medicaid benefit. [name] verbally consented to engagement with the [facility] Managed Care team.   If you are experiencing a medical emergency, please call 911 or report to your local emergency department or urgent care.   If you have a non-emergency medical problem during routine business hours, please contact your provider's office and ask to speak with a nurse.   For questions related to your [facility], please call: [phone] or visit the homepage here: [email or web address]  If you would like to schedule transportation through your [facility], please call the following number at least 2 days in advance of your appointment: [phone]   Rides for urgent appointments can also be made after hours by calling Member Services.  Call the [place] at [phone], at any time, 24 hours a day, 7 days a week. If you are in danger or need immediate medical attention call 911.  If you would like help to quit smoking, call 1-800-QUIT-NOW (314) 827-2239) OR Espaol: 1-855-Djelo-Ya (1-660-630-1601) o para ms informacin haga clic aqu or Text READY to 200-400 to register via text  [name],   Please see education materials related to CHF provided by MyChart link.  Patient verbalizes understanding of instructions and care plan provided today and agrees to view in [place]. Active MyChart status and patient understanding of how to access instructions and care plan via MyChart confirmed with patient.     Telephone follow up appointment with Managed Medicaid care management team member scheduled for:10/28/22 @ 11:15am  [name] RN, BSN [place] RN Care  Coordinator   Following is a copy of your plan of care:  Care Plan : RN Care Manager Plan of Care  Updates made by Melissa Montane, RN since 09/28/2022 12:00 AM     Problem: Health Mangement needs related to CHF      Long-Range Goal: Development of Plan of Care to address Health Mangement needs related to CHF   Start Date: 07/16/2022  Expected End Date: 11/27/2022  Priority: High  Note:   Current Barriers:  Chronic Disease Management support and education needs related to CHF-[name] reports needing a vehicle. She has used transportation provided by [facility]. She will have surgery on 10/13/22 for a hysterectomy.  RNCM Clinical Goal(s):  Patient will verbalize understanding of plan for management of CHF as evidenced by Patient reports take all medications exactly as prescribed and will call provider for medication related questions as evidenced by patient reports and EMR documentation    attend all scheduled medical appointments: 10/08/22 with OB/GYN and 10/19 with PCP as evidenced by provider documentation           Interventions: Inter-disciplinary care team collaboration (see longitudinal plan of care) Evaluation of current treatment plan related to  self management and patient's adherence to plan as established by provider Referral to BSW, scheduled on 09/30/22 at 3pm for transportation resources   Heart Failure Interventions:  (Status: Goal on Track (progressing): YES.)  Long Term Goal  Wt Readings from Last 3 Encounters:  09/09/22 235 lb (106.6 kg)  09/03/22 237 lb 3.2 oz (107.6 kg)  07/09/22 243 lb 14.4 oz (110.6 kg)   Provided education on low sodium diet Discussed importance of daily weight and advised patient to weigh and record daily Reviewed role  of diuretics in prevention of fluid overload and management of heart failure Discussed the importance of keeping all appointments with provider Advised patient to continue to work on cutting back on smoking, provided  1-800-QUIT-NOW   Patient Goals/Self-Care Activities: Take medications as prescribed   Attend all scheduled provider appointments Call provider office for new concerns or questions  Work with the social worker to address care coordination needs and will continue to work with the clinical team to address health care and disease management related needs call office if I gain more than 2 pounds in one day or 5 pounds in one week track weight in diary use salt in moderation weigh myself daily follow rescue plan if symptoms flare-up

## 2022-09-29 DIAGNOSIS — M170 Bilateral primary osteoarthritis of knee: Secondary | ICD-10-CM | POA: Diagnosis not present

## 2022-09-30 ENCOUNTER — Other Ambulatory Visit: Payer: Self-pay

## 2022-09-30 DIAGNOSIS — M170 Bilateral primary osteoarthritis of knee: Secondary | ICD-10-CM | POA: Diagnosis not present

## 2022-09-30 NOTE — Patient Instructions (Signed)
Visit Information  [name]  - as a part of your Medicaid benefit, you are eligible for care management and care coordination services at no cost or copay. I was unable to reach you by phone today but would be happy to help you with your health related needs. Please feel free to call me @ [phone].   A member of the Managed Medicaid care management team will reach out to you again over the next 7 days.   Mickel Fuchs, BSW, [place] Managed Medicaid Team  [phone]

## 2022-09-30 NOTE — Patient Outreach (Signed)
Care Coordination  09/30/2022  [name] Dec 10, 1961 [id number]    Medicaid Managed Care   Unsuccessful Outreach Note  09/30/2022 Name: [name] MRN: [id number] DOB: 1961-06-25  Referred by: Orvis Brill, DO Reason for referral : High Risk Managed Medicaid (MM Social work telephone outreach )   An unsuccessful telephone outreach was attempted today. The patient was referred to the case management team for assistance with care management and care coordination.   Follow Up Plan: The care management team will reach out to the patient again over the next 7 days.   Mickel Fuchs, BSW, [place] Managed Medicaid Team  [phone]

## 2022-10-01 DIAGNOSIS — M170 Bilateral primary osteoarthritis of knee: Secondary | ICD-10-CM | POA: Diagnosis not present

## 2022-10-02 DIAGNOSIS — M170 Bilateral primary osteoarthritis of knee: Secondary | ICD-10-CM | POA: Diagnosis not present

## 2022-10-03 DIAGNOSIS — M170 Bilateral primary osteoarthritis of knee: Secondary | ICD-10-CM | POA: Diagnosis not present

## 2022-10-04 ENCOUNTER — Other Ambulatory Visit: Payer: Self-pay | Admitting: Hematology and Oncology

## 2022-10-04 ENCOUNTER — Other Ambulatory Visit (HOSPITAL_COMMUNITY): Payer: Self-pay

## 2022-10-04 DIAGNOSIS — C50412 Malignant neoplasm of upper-outer quadrant of left female breast: Secondary | ICD-10-CM

## 2022-10-04 DIAGNOSIS — M170 Bilateral primary osteoarthritis of knee: Secondary | ICD-10-CM | POA: Diagnosis not present

## 2022-10-05 DIAGNOSIS — M170 Bilateral primary osteoarthritis of knee: Secondary | ICD-10-CM | POA: Diagnosis not present

## 2022-10-06 ENCOUNTER — Telehealth: Payer: Self-pay

## 2022-10-06 ENCOUNTER — Other Ambulatory Visit: Payer: Self-pay | Admitting: Family Medicine

## 2022-10-06 ENCOUNTER — Other Ambulatory Visit (HOSPITAL_COMMUNITY): Payer: Self-pay

## 2022-10-06 DIAGNOSIS — M170 Bilateral primary osteoarthritis of knee: Secondary | ICD-10-CM | POA: Diagnosis not present

## 2022-10-06 MED ORDER — EZETIMIBE 10 MG PO TABS
10.0000 mg | ORAL_TABLET | Freq: Every day | ORAL | 1 refills | Status: DC
  Filled 2022-10-06: qty 30, 30d supply, fill #0

## 2022-10-06 MED ORDER — EMPAGLIFLOZIN 10 MG PO TABS
10.0000 mg | ORAL_TABLET | Freq: Every day | ORAL | 1 refills | Status: DC
  Filled 2022-10-06: qty 30, 30d supply, fill #0

## 2022-10-06 MED ORDER — CARVEDILOL 25 MG PO TABS
25.0000 mg | ORAL_TABLET | Freq: Two times a day (BID) | ORAL | 1 refills | Status: DC
  Filled 2022-10-06: qty 60, 30d supply, fill #0

## 2022-10-06 NOTE — Telephone Encounter (Signed)
Per Joylene John NP, pt is aware of the surgery on 10/25 has been adjusted to November 2. Plan and instructions will be discussed when she sees Dr. Berline Lopes on Friday 10/20. Pt voiced an understanding with date/time.

## 2022-10-07 ENCOUNTER — Other Ambulatory Visit (HOSPITAL_COMMUNITY): Payer: Self-pay

## 2022-10-07 ENCOUNTER — Ambulatory Visit (INDEPENDENT_AMBULATORY_CARE_PROVIDER_SITE_OTHER): Payer: Medicaid Other | Admitting: Student

## 2022-10-07 ENCOUNTER — Encounter: Payer: Self-pay | Admitting: Gynecologic Oncology

## 2022-10-07 ENCOUNTER — Encounter: Payer: Self-pay | Admitting: Student

## 2022-10-07 VITALS — BP 128/76 | HR 76 | Ht 64.0 in

## 2022-10-07 DIAGNOSIS — M170 Bilateral primary osteoarthritis of knee: Secondary | ICD-10-CM | POA: Diagnosis not present

## 2022-10-07 DIAGNOSIS — Z01818 Encounter for other preprocedural examination: Secondary | ICD-10-CM

## 2022-10-07 LAB — POCT GLYCOSYLATED HEMOGLOBIN (HGB A1C): Hemoglobin A1C: 6 % — AB (ref 4.0–5.6)

## 2022-10-07 NOTE — Progress Notes (Signed)
    SUBJECTIVE:   CHIEF COMPLAINT / HPI:   Surgical Pre-operative Evaluation:  Procedure: Hysterectomy Procedural risk: Intermediate risk-hysterectomy (probable mini laparotomy)  Anesthesia: General  PMH: CAD, hypertension, endometrial hyperplasia with dysfunctional uterine bleeding, hyperlipidemia, history of cocaine use on methadone, breast cancer in remission, tobacco use, history of STEMI, prediabetes  Surgical History:  The patient denies any complication with anesthesia, bleeding, or post-operative confusion, nausea, or vomiting in prior surgical interventions. The patient denies any  history of spinal surgeries.  Family History: The patient denies any family history of complications with anesthesia and VTE.  Social History: Tobacco Use: down to 4 cigarettes  Alcohol Use: None Other substance use: History of cocaine use  Screening for OSA: Says that she was told to wear a CPAP machine in the past, but does not do so   Functional Capacity:  Walking 2 blocks = 4 METS  PERTINENT  PMH / PSH: Reviewed, see above  OBJECTIVE:   BP 128/76   Pulse 76   Ht '5\' 4"'$  (1.626 m)   LMP 08/07/2011   SpO2 96%   BMI 40.34 kg/m  General: Well-appearing, obese, no distress CV: Regular rate and rhythm Respiratory: Normal work of breathing on room air Abdomen: Enlarged, palpable fibroids in uterus.  Soft.  Nontender.  Normoactive bowel sounds. Extremities: 1+ pitting edema bilaterally, no calf tenderness.  No erythema, warmth in the calves. Skin: No rashes or lesions.   ASSESSMENT/PLAN:     Final Assessment:  The patient is at moderate to high risk of complications from a intermediate risk surgery.    She has already been seen by her cardiologist for perioperative risk assessment-with a major cardiac risk event of 11% according to the revised cardiac risk index (RCRI), placing her at high risk for perioperative complications.  Recommendation is that she does not need any  further cardiovascular testing, and may proceed to surgery at acceptable risk.  Recommendation is to hold her aspirin for 7 days prior to surgery, and resume when it is felt to be safe from a bleeding standpoint.  I recommend the following additional tests: CBC, BMP, BNP, A1c I recommend the following changes to medications in the perioperative setting: Hold Jardiance 3 to 4 days prior to surgery.  Hold aspirin 7 days prior to surgery.  Continue carvedilol, Imdur on day of surgery.  I recommended tobacco cessation at least 8 weeks prior to surgery to hopefully increase wound healing.   Orvis Brill, [place]

## 2022-10-07 NOTE — Patient Instructions (Addendum)
So great seeing you today.  Regarding your surgery, please follow the directions below: - STOP taking your Jardiance for 3-4 days before surgery - On the day of your surgery:  Take Aspirin, Carvedilol, Methadone, Imdur  DO NOT take Lasix  Please follow up with [name] after your surgery.  I am going to get some labs today. I will call if they are abnormal.  Dr. Orvis Brill

## 2022-10-08 ENCOUNTER — Other Ambulatory Visit: Payer: Self-pay

## 2022-10-08 ENCOUNTER — Other Ambulatory Visit (HOSPITAL_COMMUNITY): Payer: Self-pay

## 2022-10-08 ENCOUNTER — Other Ambulatory Visit: Payer: Self-pay | Admitting: Gynecologic Oncology

## 2022-10-08 ENCOUNTER — Inpatient Hospital Stay: Payer: Medicaid Other | Admitting: Gynecologic Oncology

## 2022-10-08 ENCOUNTER — Other Ambulatory Visit: Payer: Self-pay | Admitting: Hematology and Oncology

## 2022-10-08 DIAGNOSIS — N8502 Endometrial intraepithelial neoplasia [EIN]: Secondary | ICD-10-CM

## 2022-10-08 DIAGNOSIS — M170 Bilateral primary osteoarthritis of knee: Secondary | ICD-10-CM | POA: Diagnosis not present

## 2022-10-08 DIAGNOSIS — Z9011 Acquired absence of right breast and nipple: Secondary | ICD-10-CM | POA: Diagnosis not present

## 2022-10-08 DIAGNOSIS — C50412 Malignant neoplasm of upper-outer quadrant of left female breast: Secondary | ICD-10-CM

## 2022-10-08 DIAGNOSIS — C50912 Malignant neoplasm of unspecified site of left female breast: Secondary | ICD-10-CM | POA: Diagnosis not present

## 2022-10-08 MED ORDER — METHADONE HCL 5 MG PO TABS
15.0000 mg | ORAL_TABLET | Freq: Three times a day (TID) | ORAL | 0 refills | Status: DC | PRN
  Filled 2022-10-08: qty 270, 30d supply, fill #0

## 2022-10-08 NOTE — Patient Outreach (Signed)
Medicaid Managed Care Social Work Note  10/08/2022 Name:  [name] MRN:  [id number] DOB:  July 13, 1961  [name] is an 61 y.o. year old female who is a primary patient of Savannah Brill, DO.  The Medicaid Managed Care Coordination team was consulted for assistance with:  Community Resources   [name] was given information about Medicaid Managed Care Coordination team services today. [name] Patient agreed to services and verbal consent obtained.  Engaged with patient  for by telephone forfollow up visit in response to referral for case management and/or care coordination services.   Assessments/Interventions:  Review of past medical history, allergies, medications, health status, including review of consultants reports, laboratory and other test data, was performed as part of comprehensive evaluation and provision of chronic care management services.  SDOH: (Social Determinant of Health) assessments and interventions performed: SDOH Interventions    Flowsheet Row Patient Outreach Telephone from 08/27/2022 in [place] Patient Outreach Telephone from 07/16/2022 in [place] Patient Outreach Telephone from 09/21/2021 in [place] Patient Outreach Telephone from 07/28/2021 in [place] Interventions      Food Insecurity Interventions -- Intervention Not Indicated Other (Comment) Intervention Not Indicated  Housing Interventions -- Intervention Not Indicated -- Intervention Not Indicated  Transportation Interventions Other (Comment)  [Provided patient with [facility] medical transportation [phone] Intervention Not Indicated -- Intervention Not Indicated  Financial Strain Interventions -- -- -- Intervention Not Indicated  Physical Activity Interventions -- -- -- Intervention Not Indicated  Stress  Interventions -- -- -- Intervention Not Indicated  Social Connections Interventions -- -- -- Intervention Not Indicated     BSW completed a telephone outreach with patient. She left BSW a voicemail stating that she is now homeless. Patient continues to state that she is homesless and is needing somewhere to live. BSW verbally provided patient with income based housing resource that she wrote down. Patient states she is having surgery next month and needs somewhere to go. No other resources are needed at this time.   Advanced Directives Status:  Not addressed in this encounter.  Care Plan                 No Known Allergies  Medications Reviewed Today     Reviewed by [place], Kimberly, [place] (Certified Medical Assistant) on 10/07/22 at [phone]  Med List Status: <None>   Medication Order Taking? Sig Documenting Provider Last Dose Status Informant  acetaminophen (TYLENOL) 325 MG tablet [id number] No Take 2 tablets (650 mg total) by mouth every 6 (six) hours as needed for mild pain, moderate pain or headache. Savannah Essex, MD Taking Active   allopurinol (ZYLOPRIM) 300 MG tablet [id number] No Take 1 tablet (300 mg total) by mouth daily. Savannah Haw, MD Taking Active Self  ascorbic acid (VITAMIN C) 500 MG tablet [id number] No Take 500 mg by mouth daily. [provider] Taking Active Self  aspirin EC 81 MG tablet [id number] No Take 81 mg by mouth daily. [provider] Taking Active Self  atorvastatin (LIPITOR) 40 MG tablet [id number] No TAKE 1 TABLET BY MOUTH DAILY AT 6 PM.  Patient taking differently: Take 40 mg by mouth daily.   Savannah Haw, MD Taking Active Self  Patient not taking:  Discontinued 10/07/22 0942   carvedilol (COREG) 25 MG tablet [id number]  Take 1 tablet (25 mg total) by mouth 2 (two) times daily with  a meal. Savannah, Luna Fuse, DO  Active   cholecalciferol (VITAMIN D3) 25 MCG (1000 UNIT) tablet [id number] No Take 1,000 Units by mouth daily. [provider]  Taking Active Self  empagliflozin (JARDIANCE) 10 MG TABS tablet [id number]  Take 1 tablet (10 mg total) by mouth daily. Savannah Brill, DO  Active   ezetimibe (ZETIA) 10 MG tablet [id number]  Take 1 tablet (10 mg total) by mouth daily. Savannah Brill, DO  Active   Discontinued 03/10/12 1404 (Error)   fluticasone (FLONASE) 50 MCG/ACT nasal spray [id number] No Place 1 spray into both nostrils daily. Savannah Essex, MD Taking Active   furosemide (LASIX) 20 MG tablet [id number] No Take 1 tablet (20 mg total) by mouth daily. TAKE AN EXTRA 20 MG IF WGT GAIN 3 LBS IN 24 HOURS OR 5 LBS IN A WEEK Savannah, Junius [name]., NP Taking Active   isosorbide mononitrate (IMDUR) 60 MG 24 hr tablet [id number] No Take 1 tablet (60 mg total) by mouth at bedtime. Savannah Haw, MD Taking Active Self  medroxyPROGESTERone (PROVERA) 10 MG tablet [id number] No Take 2 tablets (20 mg total) by mouth daily. Savannah John D, NP Taking Active Self  methadone (DOLOPHINE) 5 MG tablet [id number] No Take 3 tablets by mouth every 8  hours as needed Savannah, Arletha Pili, MD Taking Active   nitroGLYCERIN (NITROSTAT) 0.4 MG SL tablet [id number] No Place 1 tablet (0.4 mg total) under the tongue every 5 (five) minutes as needed for chest pain. Savannah Haw, MD Taking Active Self  potassium chloride SA (KLOR-CON M20) 20 MEQ tablet [id number] No Take 1 tablet (20 mEq total) by mouth 3 (three) times daily. 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   BMI 40.0-44.9, adult ([place]) 10/26/2021   COVID-19 vaccine administered 07/27/2021   Prediabetes 01/01/2021   Concern about ear disease without diagnosis 01/01/2021   Neck mass 11/05/2020   Arthritis of right knee 06/11/2020   Acquired absence of both breasts 04/01/2020   Bilateral leg edema 06/21/2019   Dysfunctional uterine bleeding 04/30/2019   Right leg pain 09/13/2018   Acquired trigger finger of right ring finger 05/29/2018   Hyperlipidemia LDL goal <70 02/08/2018   History of ST elevation myocardial infarction (STEMI) 12/01/2017   Acute ST segment elevation myocardial infarction ([place]) 12/01/2017   Endometrial hyperplasia, simple 11/08/2017   Unilateral primary osteoarthritis, right knee 10/27/2017   Essential hypertension 04/17/2016   CAD (coronary artery disease) 04/17/2016   Shortness of breath    Thrombocytosis 03/10/2015   Tobacco abuse 12/02/2014   Cancer of overlapping sites of right female breast ([place]) 09/16/2014   Breast cancer of upper-outer quadrant of left female breast ([place]) 09/16/2014   Hot flashes due to tamoxifen 05/09/2014   Lymphedema of arm 11/08/2013   Hypokalemia 03/31/2012   Atherosclerosis of aorta (HCC)     Conditions to be addressed/monitored per PCP  order:  Homelessness  There are no care plans that you recently modified to display for this patient.   Follow up:  Patient agrees to Care Plan and Follow-up.  Plan: The Managed Medicaid care management team will reach out to the patient again over the next 14 days.  Date/time of next scheduled Social Work care management/care coordination outreach:  10/28/22  Mickel Fuchs, Arita Miss, [place] Medicaid Team  [phone]

## 2022-10-08 NOTE — Patient Instructions (Signed)
Visit Information  [name] was given information about Medicaid Managed Care team care coordination services as a part of their [employer] Medicaid benefit. [name] verbally consented to engagement with the [facility] Managed Care team.   If you are experiencing a medical emergency, please call 911 or report to your local emergency department or urgent care.   If you have a non-emergency medical problem during routine business hours, please contact your provider's office and ask to speak with a nurse.   For questions related to your [facility], please call: [phone] or visit the homepage here: [email or web address]  If you would like to schedule transportation through your [facility], please call the following number at least 2 days in advance of your appointment: [phone]   Rides for urgent appointments can also be made after hours by calling Member Services.  Call the [place] at [phone], at any time, 24 hours a day, 7 days a week. If you are in danger or need immediate medical attention call 911.  If you would like help to quit smoking, call 1-800-QUIT-NOW (614) 189-3489) OR Espaol: 1-855-Djelo-Ya (7-001-749-4496) o para ms informacin haga clic aqu or Text READY to 200-400 to register via text  [name] - following are the goals we discussed in your visit today:   Goals Addressed   None      Social Worker will follow up in 14 days .   Mickel Fuchs, BSW, [place] Managed Medicaid Team  [phone]   Following is a copy of your plan of care:  There are no care plans that you recently modified to display for this patient.

## 2022-10-09 DIAGNOSIS — M170 Bilateral primary osteoarthritis of knee: Secondary | ICD-10-CM | POA: Diagnosis not present

## 2022-10-09 LAB — BASIC METABOLIC PANEL
BUN/Creatinine Ratio: 9 (ref 9–23)
BUN: 7 mg/dL (ref 6–24)
CO2: 19 mmol/L — ABNORMAL LOW (ref 20–29)
Calcium: 9.1 mg/dL (ref 8.7–10.2)
Chloride: 106 mmol/L (ref 96–106)
Creatinine, Ser: 0.81 mg/dL (ref 0.57–1.00)
Glucose: 94 mg/dL (ref 70–99)
Potassium: 4 mmol/L (ref 3.5–5.2)
Sodium: 144 mmol/L (ref 134–144)
eGFR: 84 mL/min/{1.73_m2} (ref >59)

## 2022-10-09 LAB — CBC
Hematocrit: 47.9 % — ABNORMAL HIGH (ref 34.0–46.6)
Hemoglobin: 16 g/dL — ABNORMAL HIGH (ref 11.1–15.9)
MCH: 27.9 pg (ref 26.6–33.0)
MCHC: 33.4 g/dL (ref 31.5–35.7)
MCV: 84 fL (ref 79–97)
Platelets: 488 10*3/uL — ABNORMAL HIGH (ref 150–450)
RBC: 5.73 x10E6/uL — ABNORMAL HIGH (ref 3.77–5.28)
RDW: 16.8 % — ABNORMAL HIGH (ref 11.7–15.4)
WBC: 9.4 10*3/uL (ref 3.4–10.8)

## 2022-10-09 LAB — BRAIN NATRIURETIC PEPTIDE: BNP: 157 pg/mL — ABNORMAL HIGH (ref 0.0–100.0)

## 2022-10-10 DIAGNOSIS — M170 Bilateral primary osteoarthritis of knee: Secondary | ICD-10-CM | POA: Diagnosis not present

## 2022-10-11 DIAGNOSIS — M170 Bilateral primary osteoarthritis of knee: Secondary | ICD-10-CM | POA: Diagnosis not present

## 2022-10-12 DIAGNOSIS — M170 Bilateral primary osteoarthritis of knee: Secondary | ICD-10-CM | POA: Diagnosis not present

## 2022-10-13 DIAGNOSIS — M170 Bilateral primary osteoarthritis of knee: Secondary | ICD-10-CM | POA: Diagnosis not present

## 2022-10-13 NOTE — Progress Notes (Addendum)
COVID Vaccine Completed: yes  Date of COVID positive in last 90 days:  PCP - Orvis Brill, DO Cardiologist - Daneen Schick, MD  Chest x-ray - 07/04/22 Epic EKG - 09/03/22 Epic Stress Test - 07/08/22 Epic ECHO - 07/05/22 Epic Cardiac Cath -  Pacemaker/ICD device last checked: Spinal Cord Stimulator:  Bowel Prep -   Sleep Study -  CPAP -   Fasting Blood Sugar - preDM Checks Blood Sugar _____ times a day  Blood Thinner Instructions: Aspirin Instructions: Last Dose:  Activity level:  Can go up a flight of stairs and perform activities of daily living without stopping and without symptoms of chest pain or shortness of breath.  Able to exercise without symptoms  Unable to go up a flight of stairs without symptoms of     Anesthesia review: atherosclerosis, HTN, CAD, CHF, MI, thrombocytosis, pre DM, CKD  Patient denies shortness of breath, fever, cough and chest pain at PAT appointment  Patient verbalized understanding of instructions that were given to them at the PAT appointment. Patient was also instructed that they will need to review over the PAT instructions again at home before surgery.

## 2022-10-14 ENCOUNTER — Encounter (HOSPITAL_COMMUNITY): Payer: Self-pay

## 2022-10-14 ENCOUNTER — Encounter (HOSPITAL_COMMUNITY)
Admission: RE | Admit: 2022-10-14 | Discharge: 2022-10-14 | Disposition: A | Discharge status: Home / Self Care | Payer: Medicaid Other | Source: Ambulatory Visit | Attending: Gynecologic Oncology | Admitting: Gynecologic Oncology

## 2022-10-14 DIAGNOSIS — Z01812 Encounter for preprocedural laboratory examination: Secondary | ICD-10-CM | POA: Diagnosis present

## 2022-10-14 DIAGNOSIS — N8502 Endometrial intraepithelial neoplasia [EIN]: Secondary | ICD-10-CM | POA: Diagnosis not present

## 2022-10-14 DIAGNOSIS — M170 Bilateral primary osteoarthritis of knee: Secondary | ICD-10-CM | POA: Diagnosis not present

## 2022-10-14 HISTORY — DX: Lymphedema, not elsewhere classified: I89.0

## 2022-10-14 LAB — COMPREHENSIVE METABOLIC PANEL
ALT: 22 U/L (ref 0–44)
AST: 19 U/L (ref 15–41)
Albumin: 3.7 g/dL (ref 3.5–5.0)
Alkaline Phosphatase: 88 U/L (ref 38–126)
Anion gap: 8 (ref 5–15)
BUN: 11 mg/dL (ref 6–20)
CO2: 25 mmol/L (ref 22–32)
Calcium: 9 mg/dL (ref 8.9–10.3)
Chloride: 111 mmol/L (ref 98–111)
Creatinine, Ser: 0.83 mg/dL (ref 0.44–1.00)
GFR, Estimated: >60 mL/min (ref >60)
Glucose, Bld: 81 mg/dL (ref 70–99)
Potassium: 3.6 mmol/L (ref 3.5–5.1)
Sodium: 144 mmol/L (ref 135–145)
Total Bilirubin: 0.7 mg/dL (ref 0.3–1.2)
Total Protein: 7.1 g/dL (ref 6.5–8.1)

## 2022-10-14 LAB — CBC
HCT: 47.4 % — ABNORMAL HIGH (ref 36.0–46.0)
Hemoglobin: 15.2 g/dL — ABNORMAL HIGH (ref 12.0–15.0)
MCH: 27.7 pg (ref 26.0–34.0)
MCHC: 32.1 g/dL (ref 30.0–36.0)
MCV: 86.3 fL (ref 80.0–100.0)
Platelets: 478 10*3/uL — ABNORMAL HIGH (ref 150–400)
RBC: 5.49 MIL/uL — ABNORMAL HIGH (ref 3.87–5.11)
RDW: 18.5 % — ABNORMAL HIGH (ref 11.5–15.5)
WBC: 9.1 10*3/uL (ref 4.0–10.5)
nRBC: 0 % (ref 0.0–0.2)

## 2022-10-14 NOTE — Progress Notes (Signed)
   10/14/22 1419  OBSTRUCTIVE SLEEP APNEA  Have you ever been diagnosed with sleep apnea through a sleep study? No  Do you snore loudly (loud enough to be heard through closed doors)?  1  Do you often feel tired, fatigued, or sleepy during the daytime (such as falling asleep during driving or talking to someone)? 0  Has anyone observed you stop breathing during your sleep? 1  Do you have, or are you being treated for high blood pressure? 1  BMI more than 35 kg/m2? 1  Age > 50 (1-yes) 1  Neck circumference greater than:Female 16 inches or larger, Female 17inches or larger? 0  Female Gender (Yes=1) 0  Obstructive Sleep Apnea Score 5

## 2022-10-14 NOTE — Patient Instructions (Addendum)
SURGICAL WAITING ROOM VISITATION Patients having surgery or a procedure may have no more than 2 support people in the waiting area - these visitors may rotate.   Children under the age of 30 must have an adult with them who is not the patient. If the patient needs to stay at the hospital during part of their recovery, the visitor guidelines for inpatient rooms apply. Pre-op nurse will coordinate an appropriate time for 1 support person to accompany patient in pre-op.  This support person may not rotate.    Please refer to the [facility] website for the visitor guidelines for Inpatients (after your surgery is over and you are in a regular room).    Your procedure is scheduled on: 10/21/22   Report to [facility] Main Entrance    Report to admitting at 5:15 AM   Call this number if you have problems the morning of surgery [phone]   Eat a light diet the day before surgery   Do not eat food :After Midnight.   After Midnight you may have the following liquids until 4:30 AM DAY OF SURGERY  Water Non-Citrus Juices (without pulp, NO RED) Carbonated Beverages Black Coffee (NO MILK/CREAM OR CREAMERS, sugar ok)  Clear Tea (NO MILK/CREAM OR CREAMERS, sugar ok) regular and decaf                             Plain Jell-O (NO RED)                                           Fruit ices (not with fruit pulp, NO RED)                                     Popsicles (NO RED)                                                               Sports drinks like Gatorade (NO RED)                       If you have questions, please contact your surgeon's office.   FOLLOW BOWEL PREP AND ANY ADDITIONAL PRE OP INSTRUCTIONS YOU RECEIVED FROM YOUR SURGEON'S OFFICE!!!     Oral Hygiene is also important to reduce your risk of infection.                                    Remember - BRUSH YOUR TEETH THE MORNING OF SURGERY WITH YOUR REGULAR TOOTHPASTE   Do NOT smoke after Midnight   Take these medicines  the morning of surgery with A SIP OF WATER: Allopurinol, Carvedilol, Zetia, Isosorbide   Do not take Jardiance for 3 days before surgery. Last dose 10/17/22.                                 You may not have any metal  on your body including hair pins, jewelry, and body piercing             Do not wear make-up, lotions, powders, perfumes, or deodorant  Do not wear nail polish including gel and S&S, artificial/acrylic nails, or any other type of covering on natural nails including finger and toenails. If you have artificial nails, gel coating, etc. that needs to be removed by a nail salon please have this removed prior to surgery or surgery may need to be canceled/ delayed if the surgeon/ anesthesia feels like they are unable to be safely monitored.   Do not shave  48 hours prior to surgery.    Do not bring valuables to the hospital. [place].  DO NOT [place]. PHARMACY WILL DISPENSE MEDICATIONS LISTED ON YOUR MEDICATION LIST TO YOU DURING YOUR ADMISSION [place]!    Patients discharged on the day of surgery will not be allowed to drive home.  Someone NEEDS to stay with you for the first 24 hours after anesthesia.   Special Instructions: Bring a copy of your healthcare power of attorney and living will documents the day of surgery if you haven't scanned them before.              Please read over the following fact sheets you were given: IF [place] [id number] Schneiders    If you received a COVID test during your pre-op visit  it is requested that you wear a mask when out in public, stay away from anyone that may not be feeling well and notify your surgeon if you develop symptoms. If you test positive for Covid or have been in contact with anyone that has tested positive in the last 10 days please notify you surgeon.     [place] - Preparing for  Surgery Before surgery, you can play an important role.  Because skin is not sterile, your skin needs to be as free of germs as possible.  You can reduce the number of germs on your skin by washing with CHG (chlorahexidine gluconate) soap before surgery.  CHG is an antiseptic cleaner which kills germs and bonds with the skin to continue killing germs even after washing. Please DO NOT use if you have an allergy to CHG or antibacterial soaps.  If your skin becomes reddened/irritated stop using the CHG and inform your nurse when you arrive at Short Stay. Do not shave (including legs and underarms) for at least 48 hours prior to the first CHG shower.  You may shave your face/neck.  Please follow these instructions carefully:  1.  Shower with CHG Soap the night before surgery and the  morning of surgery.  2.  If you choose to wash your hair, wash your hair first as usual with your normal  shampoo.  3.  After you shampoo, rinse your hair and body thoroughly to remove the shampoo.                             4.  Use CHG as you would any other liquid soap.  You can apply chg directly to the skin and wash.  Gently with a scrungie or clean washcloth.  5.  Apply the CHG Soap to your body ONLY FROM THE NECK  DOWN.   Do   not use on face/ open                           Wound or open sores. Avoid contact with eyes, ears mouth and   genitals (private parts).                       Wash face,  Genitals (private parts) with your normal soap.             6.  Wash thoroughly, paying special attention to the area where your    surgery  will be performed.  7.  Thoroughly rinse your body with warm water from the neck down.  8.  DO NOT shower/wash with your normal soap after using and rinsing off the CHG Soap.                9.  Pat yourself dry with a clean towel.            10.  Wear clean pajamas.            11.  Place clean sheets on your bed the night of your first shower and do not  sleep with pets. Day of Surgery  : Do not apply any lotions/deodorants the morning of surgery.  Please wear clean clothes to the hospital/surgery center.  FAILURE TO FOLLOW THESE INSTRUCTIONS MAY RESULT IN THE CANCELLATION OF YOUR SURGERY  PATIENT SIGNATURE_________________________________  NURSE SIGNATURE__________________________________  ________________________________________________________________________   WHAT IS A BLOOD TRANSFUSION? Blood Transfusion Information  A transfusion is the replacement of blood or some of its parts. Blood is made up of multiple cells which provide different functions. Red blood cells carry oxygen and are used for blood loss replacement. White blood cells fight against infection. Platelets control bleeding. Plasma helps clot blood. Other blood products are available for specialized needs, such as hemophilia or other clotting disorders. BEFORE THE TRANSFUSION  Who gives blood for transfusions?  Healthy volunteers who are fully evaluated to make sure their blood is safe. This is blood bank blood. Transfusion therapy is the safest it has ever been in the practice of medicine. Before blood is taken from a donor, a complete history is taken to make sure that person has no history of diseases nor engages in risky social behavior (examples are intravenous drug use or sexual activity with multiple partners). The donor's travel history is screened to minimize risk of transmitting infections, such as malaria. The donated blood is tested for signs of infectious diseases, such as HIV and hepatitis. The blood is then tested to be sure it is compatible with you in order to minimize the chance of a transfusion reaction. If you or a relative donates blood, this is often done in anticipation of surgery and is not appropriate for emergency situations. It takes many days to process the donated blood. RISKS AND COMPLICATIONS Although transfusion therapy is very safe and saves many lives, the main dangers of  transfusion include:  Getting an infectious disease. Developing a transfusion reaction. This is an allergic reaction to something in the blood you were given. Every precaution is taken to prevent this. The decision to have a blood transfusion has been considered carefully by your caregiver before blood is given. Blood is not given unless the benefits outweigh the risks. AFTER THE TRANSFUSION Right after receiving a blood transfusion, you will usually feel much better and more energetic.  This is especially true if your red blood cells have gotten low (anemic). The transfusion raises the level of the red blood cells which carry oxygen, and this usually causes an energy increase. The nurse administering the transfusion will monitor you carefully for complications. HOME CARE INSTRUCTIONS  No special instructions are needed after a transfusion. You may find your energy is better. Speak with your caregiver about any limitations on activity for underlying diseases you may have. SEEK MEDICAL CARE IF:  Your condition is not improving after your transfusion. You develop redness or irritation at the intravenous (IV) site. SEEK IMMEDIATE MEDICAL CARE IF:  Any of the following symptoms occur over the next 12 hours: Shaking chills. You have a temperature by mouth above 102 F (38.9 C), not controlled by medicine. Chest, back, or muscle pain. People around you feel you are not acting correctly or are confused. Shortness of breath or difficulty breathing. Dizziness and fainting. You get a rash or develop hives. You have a decrease in urine output. Your urine turns a dark color or changes to pink, red, or brown. Any of the following symptoms occur over the next 10 days: You have a temperature by mouth above 102 F (38.9 C), not controlled by medicine. Shortness of breath. Weakness after normal activity. The white part of the eye turns yellow (jaundice). You have a decrease in the amount of urine or are  urinating less often. Your urine turns a dark color or changes to pink, red, or brown. Document Released: 12/03/2000 Document Revised: 02/28/2012 Document Reviewed: 07/22/2008 [facility] Patient Information 2014 [place], [place].  _______________________________________________________________________

## 2022-10-15 ENCOUNTER — Encounter (HOSPITAL_COMMUNITY): Payer: Self-pay | Admitting: Physician Assistant

## 2022-10-15 DIAGNOSIS — M170 Bilateral primary osteoarthritis of knee: Secondary | ICD-10-CM | POA: Diagnosis not present
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
## 2022-10-15 NOTE — Anesthesia Preprocedure Evaluation (Deleted)
Anesthesia Evaluation    Airway        Dental   Pulmonary Current Smoker and Patient abstained from smoking.,           Cardiovascular hypertension,      Neuro/Psych    GI/Hepatic   Endo/Other    Renal/GU      Musculoskeletal   Abdominal   Peds  Hematology   Anesthesia Other Findings   Reproductive/Obstetrics                             Anesthesia Physical Anesthesia Plan  ASA:   Anesthesia Plan:    Post-op Pain Management:    Induction:   PONV Risk Score and Plan:   Airway Management Planned:   Additional Equipment:   Intra-op Plan:   Post-operative Plan:   Informed Consent:   Plan Discussed with:   Anesthesia Plan Comments: (See PAT note 10/14/2022)        Anesthesia Quick Evaluation

## 2022-10-16 DIAGNOSIS — M170 Bilateral primary osteoarthritis of knee: Secondary | ICD-10-CM | POA: Diagnosis not present

## 2022-10-17 DIAGNOSIS — M170 Bilateral primary osteoarthritis of knee: Secondary | ICD-10-CM | POA: Diagnosis not present

## 2022-10-18 DIAGNOSIS — M170 Bilateral primary osteoarthritis of knee: Secondary | ICD-10-CM | POA: Diagnosis not present

## 2022-10-19 ENCOUNTER — Other Ambulatory Visit: Payer: Self-pay

## 2022-10-19 ENCOUNTER — Ambulatory Visit: Payer: Medicaid Other

## 2022-10-19 ENCOUNTER — Encounter: Payer: Self-pay | Admitting: Gynecologic Oncology

## 2022-10-19 ENCOUNTER — Inpatient Hospital Stay: Payer: Medicaid Other | Admitting: Gynecologic Oncology

## 2022-10-19 ENCOUNTER — Inpatient Hospital Stay: Payer: Medicaid Other | Attending: Adult Health | Admitting: Gynecologic Oncology

## 2022-10-19 ENCOUNTER — Ambulatory Visit (INDEPENDENT_AMBULATORY_CARE_PROVIDER_SITE_OTHER): Payer: Medicaid Other | Admitting: Podiatry

## 2022-10-19 VITALS — BP 138/84 | HR 81 | Temp 98.3°F | Resp 18 | Wt 240.8 lb

## 2022-10-19 DIAGNOSIS — Z79890 Hormone replacement therapy (postmenopausal): Secondary | ICD-10-CM | POA: Insufficient documentation

## 2022-10-19 DIAGNOSIS — Z853 Personal history of malignant neoplasm of breast: Secondary | ICD-10-CM | POA: Diagnosis not present

## 2022-10-19 DIAGNOSIS — N8502 Endometrial intraepithelial neoplasia [EIN]: Secondary | ICD-10-CM

## 2022-10-19 DIAGNOSIS — R600 Localized edema: Secondary | ICD-10-CM | POA: Diagnosis not present

## 2022-10-19 DIAGNOSIS — H9201 Otalgia, right ear: Secondary | ICD-10-CM

## 2022-10-19 DIAGNOSIS — M170 Bilateral primary osteoarthritis of knee: Secondary | ICD-10-CM | POA: Diagnosis not present

## 2022-10-19 DIAGNOSIS — M79672 Pain in left foot: Secondary | ICD-10-CM

## 2022-10-19 DIAGNOSIS — H9311 Tinnitus, right ear: Secondary | ICD-10-CM

## 2022-10-19 DIAGNOSIS — E669 Obesity, unspecified: Secondary | ICD-10-CM
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
## 2022-10-19 NOTE — Patient Instructions (Incomplete)
We will get you surgery rescheduled for December 02, 2022. We will see you back in the office for a pre-op appointment with Marijayne Rauth NP closer to the date.  Contacts: For questions or concerns you should contact:  Dr. Jeral Pinch at [phone]  Joylene John, NP at [phone]  After Hours: call [phone] and have the GYN Oncologist paged/contacted (after 5 pm or on the weekends).  Messages sent via mychart are for non-urgent matters and are not responded to after hours so for urgent needs, please call the after hours number.

## 2022-10-20 ENCOUNTER — Ambulatory Visit: Payer: Medicaid Other | Admitting: Student

## 2022-10-20 DIAGNOSIS — M170 Bilateral primary osteoarthritis of knee: Secondary | ICD-10-CM | POA: Diagnosis not present

## 2022-10-21 DIAGNOSIS — M170 Bilateral primary osteoarthritis of knee: Secondary | ICD-10-CM | POA: Diagnosis not present

## 2022-10-21 DIAGNOSIS — N8502 Endometrial intraepithelial neoplasia [EIN]: Secondary | ICD-10-CM

## 2022-10-21 LAB — TYPE AND SCREEN
ABO/RH(D): B POS
Antibody Screen: NEGATIVE

## 2022-10-22 DIAGNOSIS — M170 Bilateral primary osteoarthritis of knee: Secondary | ICD-10-CM | POA: Diagnosis not present

## 2022-10-23 DIAGNOSIS — M170 Bilateral primary osteoarthritis of knee: Secondary | ICD-10-CM | POA: Diagnosis not present

## 2022-10-24 DIAGNOSIS — M170 Bilateral primary osteoarthritis of knee: Secondary | ICD-10-CM | POA: Diagnosis not present

## 2022-10-25 ENCOUNTER — Ambulatory Visit: Payer: Medicaid Other | Admitting: Family Medicine

## 2022-10-25 DIAGNOSIS — M170 Bilateral primary osteoarthritis of knee: Secondary | ICD-10-CM | POA: Diagnosis not present

## 2022-10-25 NOTE — Progress Notes (Deleted)
    SUBJECTIVE:   CHIEF COMPLAINT / HPI: ear pain and ringing  ***  PERTINENT  PMH / PSH: HTN, CAD, CHF, Hx of MI, Hx of Breast Cancer, Endometrial squamous metaplasia  OBJECTIVE:   LMP 08/07/2011   ***  ASSESSMENT/PLAN:   No problem-specific Assessment & Plan notes found for this encounter.     Salvadore Oxford, MD [place]

## 2022-10-26 DIAGNOSIS — M170 Bilateral primary osteoarthritis of knee: Secondary | ICD-10-CM | POA: Diagnosis not present

## 2022-10-27 DIAGNOSIS — M170 Bilateral primary osteoarthritis of knee: Secondary | ICD-10-CM | POA: Diagnosis not present
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
## 2022-10-28 ENCOUNTER — Other Ambulatory Visit: Payer: Self-pay | Admitting: *Deleted

## 2022-10-28 ENCOUNTER — Other Ambulatory Visit: Payer: Self-pay

## 2022-10-28 DIAGNOSIS — M170 Bilateral primary osteoarthritis of knee: Secondary | ICD-10-CM | POA: Diagnosis not present

## 2022-10-28 NOTE — Patient Outreach (Signed)
Care Coordination  10/28/2022  JACLIN FINKS 1961-04-22 [id number]   Successful outreach with Ms. Speelman today. However, she was taking care of a family issue and unable to keep this telephone appointment and request to reschedule. A new appointment was scheduled for 11/09/22 @ 9am. Patient agreed to new date and time.  [name] RN, BSN [place]  [employer] manager

## 2022-10-28 NOTE — Patient Outreach (Signed)
Medicaid Managed Care Social Work Note  10/28/2022 Name:  [name] MRN:  [id number] DOB:  [birth year]  [name] is an 61 y.o. year old female who is a primary patient of Orvis Brill, DO.  The Medicaid Managed Care Coordination team was consulted for assistance with:  Community Resources   [name] was given information about Medicaid Managed Care Coordination team services today. [name] agreed to services and verbal consent obtained.  Engaged with patient  for by telephone forfollow up visit in response to referral for case management and/or care coordination services.   Assessments/Interventions:  Review of past medical history, allergies, medications, health status, including review of consultants reports, laboratory and other test data, was performed as part of comprehensive evaluation and provision of chronic care management services.  SDOH: (Social Determinant of Health) assessments and interventions performed: SDOH Interventions    Flowsheet Row Patient Outreach Telephone from 08/27/2022 in [place] Patient Outreach Telephone from 07/16/2022 in [place] Patient Outreach Telephone from 09/21/2021 in [place] Patient Outreach Telephone from 07/28/2021 in [place] Interventions      Food Insecurity Interventions -- Intervention Not Indicated Other (Comment) Intervention Not Indicated  Housing Interventions -- Intervention Not Indicated -- Intervention Not Indicated  Transportation Interventions Other (Comment)  [Provided patient with [facility] medical transportation [phone] Intervention Not Indicated -- Intervention Not Indicated  Financial Strain Interventions -- -- -- Intervention Not Indicated  Physical Activity Interventions -- -- -- Intervention Not Indicated  Stress  Interventions -- -- -- Intervention Not Indicated  Social Connections Interventions -- -- -- Intervention Not Indicated     BSW completed a telephone outreach with patient. She states she is doing better. Her surgery was pushed back to December since she is still taking a medication, and that has put her at ease. Patient states she missed placed the resources BSW verbally gave to her and requested those resources be emailed to her at [name] or [name]. No other resources are needed at this time.  Advanced Directives Status:  Not addressed in this encounter.  Care Plan                 No Known Allergies  Medications Reviewed Today     Reviewed by Stefani Dama, CMA (Certified Medical Assistant) on 10/19/22 at [place] List Status: <None>   Medication Order Taking? Sig Documenting Provider Last Dose Status Informant  acetaminophen (TYLENOL) 325 MG tablet [id number] Yes Take 2 tablets (650 mg total) by mouth every 6 (six) hours as needed for mild pain, moderate pain or headache. Ezequiel Essex, MD Taking Active Self  allopurinol (ZYLOPRIM) 300 MG tablet [id number] Yes Take 1 tablet (300 mg total) by mouth daily. [name], MD Taking Active Self  ascorbic acid (VITAMIN C) 500 MG tablet [id number] Yes Take 500 mg by mouth daily. [provider] Taking Active Self  aspirin EC 81 MG tablet [id number] Yes Take 81 mg by mouth daily. [provider] Taking Active Self  atorvastatin (LIPITOR) 40 MG tablet [id number] Yes TAKE 1 TABLET BY MOUTH DAILY AT 6 PM. [name], MD Taking Active Self  carvedilol (COREG) 25 MG tablet [id number] Yes Take 1 tablet (25 mg total) by mouth 2 (two) times daily with a meal. Orvis Brill, DO Taking Active Self  cholecalciferol (VITAMIN D3) 25 MCG (1000 UNIT) tablet [id number] Yes Take 1,000  Units by mouth daily. [provider] Taking Active Self  empagliflozin (JARDIANCE) 10 MG TABS tablet [id number] Yes  Take 1 tablet (10 mg total) by mouth daily. Orvis Brill, DO Taking Active Self  ezetimibe (ZETIA) 10 MG tablet [id number] Yes Take 1 tablet (10 mg total) by mouth daily. Orvis Brill, DO Taking Active Self    Discontinued 03/10/12 1404 (Error)   fluticasone (FLONASE) 50 MCG/ACT nasal spray [id number] Yes Place 1 spray into both nostrils daily.  Patient taking differently: Place 1 spray into both nostrils daily as needed for allergies.   Ezequiel Essex, MD Taking Active Self  furosemide (LASIX) 20 MG tablet [id number] Yes Take 1 tablet (20 mg total) by mouth daily. TAKE AN EXTRA 20 MG IF WGT GAIN 3 LBS IN 24 HOURS OR 5 LBS IN A WEEK Dick, Junius Creamer., NP Taking Active Self  isosorbide mononitrate (IMDUR) 60 MG 24 hr tablet [id number] Yes Take 1 tablet (60 mg total) by mouth at bedtime. [name], MD Taking Active Self  medroxyPROGESTERone (PROVERA) 10 MG tablet [id number] Yes Take 2 tablets (20 mg total) by mouth daily. Joylene John D, NP Taking Active Self  methadone (DOLOPHINE) 5 MG tablet [id number] Yes Take 3 tablets by mouth every 8  hours as needed Iruku, Arletha Pili, MD Taking Active Self  nitroGLYCERIN (NITROSTAT) 0.4 MG SL tablet [id number] Yes Place 1 tablet (0.4 mg total) under the tongue every 5 (five) minutes as needed for chest pain. [name], MD Taking Active Self  potassium chloride SA (KLOR-CON M20) 20 MEQ tablet [id number] Yes Take 1 tablet (20 mEq total) by mouth 3 (three) times daily. 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   BMI 40.0-44.9, adult ([place]) 10/26/2021   COVID-19 vaccine administered 07/27/2021   Prediabetes 01/01/2021   Concern about ear disease without diagnosis 01/01/2021   Neck mass 11/05/2020   Arthritis of right knee 06/11/2020   Acquired absence of both breasts 04/01/2020   Bilateral leg edema 06/21/2019   Dysfunctional uterine bleeding 04/30/2019   Right leg pain 09/13/2018   Acquired trigger finger of right ring finger 05/29/2018   Hyperlipidemia LDL goal <70 02/08/2018   History of ST elevation myocardial infarction (STEMI) 12/01/2017   Acute ST segment elevation myocardial infarction ([place]) 12/01/2017   Endometrial hyperplasia, simple 11/08/2017   Unilateral primary osteoarthritis, right knee 10/27/2017   Essential hypertension 04/17/2016   CAD (coronary artery disease) 04/17/2016   Shortness of breath    Thrombocytosis 03/10/2015   Tobacco abuse 12/02/2014   Cancer of overlapping sites of right female breast ([place]) 09/16/2014   Breast cancer of upper-outer quadrant of left female breast ([place]) 09/16/2014   Hot flashes due to tamoxifen 05/09/2014   Lymphedema of arm 11/08/2013   Hypokalemia 03/31/2012   Atherosclerosis of aorta (HCC)     Conditions to be addressed/monitored per PCP order:  community resources  There are no care plans that you recently modified to display for this patient.   Follow up:  Patient agrees to Care Plan and Follow-up.  Plan: The Managed Medicaid care management team will reach out to the patient again over the next 30 days.  Date/time of next scheduled Social Work care management/care coordination outreach:  11/29/22  Mickel Fuchs, Arita Miss, [place] Medicaid Team  [phone]

## 2022-10-28 NOTE — Patient Instructions (Signed)
Visit Information  Ms. Isaza was given information about Medicaid Managed Care team care coordination services as a part of their [place] Medicaid benefit. [name] verbally consented to engagement with the [facility] Managed Care team.   If you are experiencing a medical emergency, please call 911 or report to your local emergency department or urgent care.   If you have a non-emergency medical problem during routine business hours, please contact your provider's office and ask to speak with a nurse.   For questions related to your [facility], please call: [phone] or visit the homepage here: [email or web address]  If you would like to schedule transportation through your [facility], please call the following number at least 2 days in advance of your appointment: [phone]   Rides for urgent appointments can also be made after hours by calling Member Services.  Call the [place] at [phone], at any time, 24 hours a day, 7 days a week. If you are in danger or need immediate medical attention call 911.  If you would like help to quit smoking, call 1-800-QUIT-NOW 2283769413) OR Espaol: 1-855-Djelo-Ya (8-882-800-3491) o para ms informacin haga clic aqu or Text READY to 200-400 to register via text  Ms. Pinch - following are the goals we discussed in your visit today:   Goals Addressed   None       Social Worker will follow up in 11/29/22.   Mickel Fuchs, BSW, [place] Managed Medicaid Team  [phone]   Following is a copy of your plan of care:  There are no care plans that you recently modified to display for this patient.

## 2022-10-29 DIAGNOSIS — M170 Bilateral primary osteoarthritis of knee: Secondary | ICD-10-CM | POA: Diagnosis not present

## 2022-10-30 DIAGNOSIS — M170 Bilateral primary osteoarthritis of knee: Secondary | ICD-10-CM | POA: Diagnosis not present

## 2022-10-31 DIAGNOSIS — M170 Bilateral primary osteoarthritis of knee: Secondary | ICD-10-CM | POA: Diagnosis not present

## 2022-11-01 ENCOUNTER — Other Ambulatory Visit: Payer: Self-pay | Admitting: *Deleted

## 2022-11-01 DIAGNOSIS — M170 Bilateral primary osteoarthritis of knee: Secondary | ICD-10-CM | POA: Diagnosis not present

## 2022-11-02 DIAGNOSIS — M170 Bilateral primary osteoarthritis of knee: Secondary | ICD-10-CM | POA: Diagnosis not present

## 2022-11-03 ENCOUNTER — Other Ambulatory Visit (HOSPITAL_COMMUNITY): Payer: Self-pay

## 2022-11-03 ENCOUNTER — Other Ambulatory Visit: Payer: Self-pay | Admitting: Hematology and Oncology

## 2022-11-03 DIAGNOSIS — C50412 Malignant neoplasm of upper-outer quadrant of left female breast: Secondary | ICD-10-CM

## 2022-11-03 DIAGNOSIS — M170 Bilateral primary osteoarthritis of knee: Secondary | ICD-10-CM | POA: Diagnosis not present

## 2022-11-04 ENCOUNTER — Other Ambulatory Visit (HOSPITAL_COMMUNITY): Payer: Self-pay

## 2022-11-04 DIAGNOSIS — M170 Bilateral primary osteoarthritis of knee: Secondary | ICD-10-CM | POA: Diagnosis not present

## 2022-11-04 MED ORDER — METHADONE HCL 5 MG PO TABS
15.0000 mg | ORAL_TABLET | Freq: Three times a day (TID) | ORAL | 0 refills | Status: DC | PRN
  Filled 2022-11-04: qty 270, 30d supply, fill #0

## 2022-11-05 DIAGNOSIS — M170 Bilateral primary osteoarthritis of knee: Secondary | ICD-10-CM | POA: Diagnosis not present

## 2022-11-06 DIAGNOSIS — M170 Bilateral primary osteoarthritis of knee: Secondary | ICD-10-CM | POA: Diagnosis not present

## 2022-11-07 DIAGNOSIS — M170 Bilateral primary osteoarthritis of knee: Secondary | ICD-10-CM | POA: Diagnosis not present

## 2022-11-08 DIAGNOSIS — M170 Bilateral primary osteoarthritis of knee: Secondary | ICD-10-CM | POA: Diagnosis not present

## 2022-11-08 DIAGNOSIS — F331 Major depressive disorder, recurrent, moderate: Secondary | ICD-10-CM | POA: Diagnosis not present

## 2022-11-09 DIAGNOSIS — M170 Bilateral primary osteoarthritis of knee: Secondary | ICD-10-CM | POA: Diagnosis not present

## 2022-11-10 ENCOUNTER — Other Ambulatory Visit: Payer: Self-pay | Admitting: *Deleted

## 2022-11-10 ENCOUNTER — Encounter: Payer: Self-pay | Admitting: *Deleted

## 2022-11-10 DIAGNOSIS — M170 Bilateral primary osteoarthritis of knee: Secondary | ICD-10-CM | POA: Diagnosis not present

## 2022-11-10 NOTE — Patient Instructions (Signed)
Visit Information  Ms. Incorvaia was given information about Medicaid Managed Care team care coordination services as a part of their [place] Medicaid benefit. [name] verbally consented to engagement with the [facility] Managed Care team.   If you are experiencing a medical emergency, please call 911 or report to your local emergency department or urgent care.   If you have a non-emergency medical problem during routine business hours, please contact your provider's office and ask to speak with a nurse.   For questions related to your [facility], please call: [phone] or visit the homepage here: [email or web address]  If you would like to schedule transportation through your [facility], please call the following number at least 2 days in advance of your appointment: [phone]   Rides for urgent appointments can also be made after hours by calling Member Services.  Call the [place] at [phone], at any time, 24 hours a day, 7 days a week. If you are in danger or need immediate medical attention call 911.  If you would like help to quit smoking, call 1-800-QUIT-NOW 248-407-1353) OR Espaol: 1-855-Djelo-Ya (7-494-496-7591) o para ms informacin haga clic aqu or Text READY to 200-400 to register via text  Ms. Derk,   Please see education materials related to HF provided by MyChart link.  Patient verbalizes understanding of instructions and care plan provided today and agrees to view in [place]. Active MyChart status and patient understanding of how to access instructions and care plan via MyChart confirmed with patient.     Telephone follow up appointment with Managed Medicaid care management team member scheduled for:12/08/22 @ 1:15pm  [name] RN, BSN [place] RN Care  Coordinator   Following is a copy of your plan of care:  Care Plan : RN Care Manager Plan of Care  Updates made by Melissa Montane, RN since 11/10/2022 12:00 AM     Problem: Health Mangement needs related to CHF      Long-Range Goal: Development of Plan of Care to address Health Mangement needs related to CHF   Start Date: 07/16/2022  Expected End Date: 12/17/2022  Priority: High  Note:   Current Barriers:  Chronic Disease Management support and education needs related to CHF-Ms. Hansman is scheduled for outpatient surgery for hysterectomy on 12/02/22. She has transportation and has someone to be with her following the surgery. She denies any needs today.  RNCM Clinical Goal(s):  Patient will verbalize understanding of plan for management of CHF as evidenced by Patient reports take all medications exactly as prescribed and will call provider for medication related questions as evidenced by patient reports and EMR documentation    attend all scheduled medical appointments: 11/23/22 Pre Op, 11/25/22 for Pre Admission testing, 11/29/22 with BSW, 12/02/22 for Outpatient Surgery and 12/06/22 with Cardiology as evidenced by provider documentation           Interventions: Inter-disciplinary care team collaboration (see longitudinal plan of care) Evaluation of current treatment plan related to  self management and patient's adherence to plan as established by provider Review of upcoming appointments including:11/23/22 Pre Op, 11/25/22 for Pre Admission testing,11/29/22 with BSW, 12/02/22 for Outpatient surgery and 12/06/22 with Cardiology   Heart Failure Interventions:  (Status: Goal on Track (progressing): YES.)  Long Term Goal  Wt Readings from Last 3 Encounters:  10/19/22 240 lb 12.8 oz (109.2 kg)  10/14/22 235 lb 0.2 oz (106.6  kg)  09/09/22 235 lb (106.6 kg)   Provided education on low sodium diet Discussed importance of daily weight and advised patient to weigh and record daily Reviewed  role of diuretics in prevention of fluid overload and management of heart failure Discussed the importance of keeping all appointments with provider Reviewed medications and advised patient to request refills prior to running out of medication Advised patient to take all medications with her to PreOp appointments SDOH assessment   Patient Goals/Self-Care Activities: Take medications as prescribed   Attend all scheduled provider appointments Call provider office for new concerns or questions  Work with the social worker to address care coordination needs and will continue to work with the clinical team to address health care and disease management related needs call office if I gain more than 2 pounds in one day or 5 pounds in one week track weight in diary use salt in moderation weigh myself daily follow rescue plan if symptoms flare-up

## 2022-11-10 NOTE — Patient Outreach (Signed)
Medicaid Managed Care   Nurse Care Manager Note  11/10/2022 Name:  [name] MRN:  [id number] DOB:  July 15, 1961  [name] is an 61 y.o. year old female who is a primary patient of Orvis Brill, DO.  The [facility] Managed Care Coordination team was consulted for assistance with:    CHF  [name] was given information about Medicaid Managed Care Coordination team services today. [name] agreed to services and verbal consent obtained.  Engaged with patient by telephone for follow up visit in response to provider referral for case management and/or care coordination services.   Assessments/Interventions:  Review of past medical history, allergies, medications, health status, including review of consultants reports, laboratory and other test data, was performed as part of comprehensive evaluation and provision of chronic care management services.  SDOH (Social Determinants of Health) assessments and interventions performed: SDOH Interventions    Flowsheet Row Patient Outreach Telephone from 11/10/2022 in [place] Patient Outreach Telephone from 08/27/2022 in [place] Patient Outreach Telephone from 07/16/2022 in [place] Patient Outreach Telephone from 09/21/2021 in [place] Patient Outreach Telephone from 07/28/2021 in [place] Interventions       Food Insecurity Interventions Intervention Not Indicated -- Intervention Not Indicated Other (Comment) Intervention Not Indicated  Housing Interventions -- -- Intervention Not Indicated -- Intervention Not Indicated  Transportation Interventions Intervention Not Indicated Other (Comment)  [Provided patient with [facility] medical transportation [phone] Intervention Not Indicated -- Intervention Not Indicated   Financial Strain Interventions -- -- -- -- Intervention Not Indicated  Physical Activity Interventions -- -- -- -- Intervention Not Indicated  Stress Interventions -- -- -- -- Intervention Not Indicated  Social Connections Interventions -- -- -- -- Intervention Not Indicated       Care Plan  No Known Allergies  Medications Reviewed Today     Reviewed by Melissa Montane, RN (Registered Nurse) on 11/10/22 at [phone]  Med List Status: <None>   Medication Order Taking? Sig Documenting Provider Last Dose Status Informant  acetaminophen (TYLENOL) 325 MG tablet [id number] Yes Take 2 tablets (650 mg total) by mouth every 6 (six) hours as needed for mild pain, moderate pain or headache. Ezequiel Essex, MD Taking Active Self  allopurinol (ZYLOPRIM) 300 MG tablet [id number] Yes Take 1 tablet (300 mg total) by mouth daily. [name], MD Taking Active Self  ascorbic acid (VITAMIN C) 500 MG tablet [id number] Yes Take 500 mg by mouth daily. [provider] Taking Active Self  aspirin EC 81 MG tablet [id number] Yes Take 81 mg by mouth daily. [provider] Taking Active Self  atorvastatin (LIPITOR) 40 MG tablet [id number] Yes TAKE 1 TABLET BY MOUTH DAILY AT 6 PM. [name], MD Taking Active Self  carvedilol (COREG) 25 MG tablet [id number] Yes Take 1 tablet (25 mg total) by mouth 2 (two) times daily with a meal. Orvis Brill, DO Taking Active Self  cholecalciferol (VITAMIN D3) 25 MCG (1000 UNIT) tablet [id number] Yes Take 1,000 Units by mouth daily. [provider] Taking Active Self  empagliflozin (JARDIANCE) 10 MG TABS tablet [id number] Yes Take 1 tablet (10 mg total) by mouth daily. Orvis Brill, DO Taking Active Self  ezetimibe (ZETIA) 10 MG tablet [id number] Yes Take 1 tablet (10 mg total) by mouth daily. Orvis Brill, DO Taking Active Self    Discontinued 03/10/12 1404 (Error)  fluticasone (FLONASE) 50 MCG/ACT nasal spray [id number] Yes Place 1 spray into both  nostrils daily.  Patient taking differently: Place 1 spray into both nostrils daily as needed for allergies.   Ezequiel Essex, MD Taking Active Self  furosemide (LASIX) 20 MG tablet [id number] Yes Take 1 tablet (20 mg total) by mouth daily. TAKE AN EXTRA 20 MG IF WGT GAIN 3 LBS IN 24 HOURS OR 5 LBS IN A WEEK Dick, Junius Creamer., NP Taking Active Self  isosorbide mononitrate (IMDUR) 60 MG 24 hr tablet [id number] Yes Take 1 tablet (60 mg total) by mouth at bedtime. [name], MD Taking Active Self  medroxyPROGESTERone (PROVERA) 10 MG tablet [id number] Yes Take 2 tablets (20 mg total) by mouth daily. Joylene John D, NP Taking Active Self  methadone (DOLOPHINE) 5 MG tablet [id number] Yes Take 3 tablets by mouth every 8  hours as needed Iruku, Arletha Pili, MD Taking Active   nitroGLYCERIN (NITROSTAT) 0.4 MG SL tablet [id number] Yes Place 1 tablet (0.4 mg total) under the tongue every 5 (five) minutes as needed for chest pain. [name], MD Taking Active Self  potassium chloride SA (KLOR-CON M20) 20 MEQ tablet [id number] Yes Take 1 tablet (20 mEq total) by mouth 3 (three) times daily. 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   Tension headache 03/31/2022   Chest pain 03/30/2022   Hypomagnesemia 03/30/2022    Class: Acute   Constipation 03/30/2022   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   BMI 40.0-44.9, adult ([place]) 10/26/2021   COVID-19 vaccine administered 07/27/2021   Prediabetes 01/01/2021   Concern about ear disease without diagnosis 01/01/2021   Neck mass  11/05/2020   Arthritis of right knee 06/11/2020   Acquired absence of both breasts 04/01/2020   Bilateral leg edema 06/21/2019   Dysfunctional uterine bleeding 04/30/2019   Right leg pain 09/13/2018   Acquired trigger finger of right ring finger 05/29/2018   Hyperlipidemia LDL goal <70 02/08/2018   History of ST elevation myocardial infarction (STEMI) 12/01/2017   Acute ST segment elevation myocardial infarction ([place]) 12/01/2017   Endometrial hyperplasia, simple 11/08/2017   Unilateral primary osteoarthritis, right knee 10/27/2017   Essential hypertension 04/17/2016   CAD (coronary artery disease) 04/17/2016   Shortness of breath    Thrombocytosis 03/10/2015   Tobacco abuse 12/02/2014   Cancer of overlapping sites of right female breast ([place]) 09/16/2014   Breast cancer of upper-outer quadrant of left female breast ([place]) 09/16/2014   Hot flashes due to tamoxifen 05/09/2014   Lymphedema of arm 11/08/2013   Hypokalemia 03/31/2012   Atherosclerosis of aorta (HCC)     Conditions to be addressed/monitored per PCP order:  CHF  Care Plan : RN Care Manager Plan of Care  Updates made by Melissa Montane, RN since 11/10/2022 12:00 AM     Problem: Health Mangement needs related to CHF      Long-Range Goal: Development of Plan of Care to address Health Mangement needs related to CHF   Start Date: 07/16/2022  Expected End Date: 12/17/2022  Priority:  High  Note:   Current Barriers:  Chronic Disease Management support and education needs related to CHF-Ms. Pates is scheduled for outpatient surgery for hysterectomy on 12/02/22. She has transportation and has someone to be with her following the surgery. She denies any needs today.  RNCM Clinical Goal(s):  Patient will verbalize understanding of plan for management of CHF as evidenced by Patient reports take all medications exactly as prescribed and will call provider for medication related questions as evidenced by patient reports and EMR  documentation    attend all scheduled medical appointments: 11/23/22 Pre Op, 11/25/22 for Pre Admission testing, 11/29/22 with BSW, 12/02/22 for Outpatient Surgery and 12/06/22 with Cardiology as evidenced by provider documentation           Interventions: Inter-disciplinary care team collaboration (see longitudinal plan of care) Evaluation of current treatment plan related to  self management and patient's adherence to plan as established by provider Review of upcoming appointments including:11/23/22 Pre Op, 11/25/22 for Pre Admission testing,11/29/22 with BSW, 12/02/22 for Outpatient surgery and 12/06/22 with Cardiology   Heart Failure Interventions:  (Status: Goal on Track (progressing): YES.)  Long Term Goal  Wt Readings from Last 3 Encounters:  10/19/22 240 lb 12.8 oz (109.2 kg)  10/14/22 235 lb 0.2 oz (106.6 kg)  09/09/22 235 lb (106.6 kg)   Provided education on low sodium diet Discussed importance of daily weight and advised patient to weigh and record daily Reviewed role of diuretics in prevention of fluid overload and management of heart failure Discussed the importance of keeping all appointments with provider Reviewed medications and advised patient to request refills prior to running out of medication Advised patient to take all medications with her to PreOp appointments SDOH assessment   Patient Goals/Self-Care Activities: Take medications as prescribed   Attend all scheduled provider appointments Call provider office for new concerns or questions  Work with the social worker to address care coordination needs and will continue to work with the clinical team to address health care and disease management related needs call office if I gain more than 2 pounds in one day or 5 pounds in one week track weight in diary use salt in moderation weigh myself daily follow rescue plan if symptoms flare-up       Follow Up:  Patient agrees to Care Plan and Follow-up.  Plan: The  Managed Medicaid care management team will reach out to the patient again over the next 30 days.  Date/time of next scheduled RN care management/care coordination outreach:  12/08/22 @ 1:15pm  [name] RN, BSN [place]  [employer] manager

## 2022-11-11 DIAGNOSIS — M170 Bilateral primary osteoarthritis of knee: Secondary | ICD-10-CM | POA: Diagnosis not present

## 2022-11-12 DIAGNOSIS — M170 Bilateral primary osteoarthritis of knee: Secondary | ICD-10-CM | POA: Diagnosis not present

## 2022-11-13 DIAGNOSIS — M170 Bilateral primary osteoarthritis of knee: Secondary | ICD-10-CM | POA: Diagnosis not present

## 2022-11-14 DIAGNOSIS — M170 Bilateral primary osteoarthritis of knee: Secondary | ICD-10-CM | POA: Diagnosis not present

## 2022-11-15 DIAGNOSIS — M170 Bilateral primary osteoarthritis of knee: Secondary | ICD-10-CM | POA: Diagnosis not present

## 2022-11-16 DIAGNOSIS — M170 Bilateral primary osteoarthritis of knee: Secondary | ICD-10-CM | POA: Diagnosis not present

## 2022-11-17 DIAGNOSIS — M170 Bilateral primary osteoarthritis of knee: Secondary | ICD-10-CM | POA: Diagnosis not present

## 2022-11-18 ENCOUNTER — Other Ambulatory Visit: Payer: Self-pay

## 2022-11-18 ENCOUNTER — Other Ambulatory Visit (HOSPITAL_COMMUNITY): Payer: Self-pay

## 2022-11-18 ENCOUNTER — Telehealth: Payer: Self-pay | Admitting: *Deleted

## 2022-11-18 DIAGNOSIS — M170 Bilateral primary osteoarthritis of knee: Secondary | ICD-10-CM | POA: Diagnosis not present

## 2022-11-18 MED ORDER — POTASSIUM CHLORIDE CRYS ER 20 MEQ PO TBCR
20.0000 meq | EXTENDED_RELEASE_TABLET | Freq: Three times a day (TID) | ORAL | 0 refills | Status: DC
  Filled 2022-11-18: qty 270, 90d supply, fill #0

## 2022-11-18 NOTE — Telephone Encounter (Signed)
Patient called and stated "If Dr Berline Lopes wouldn't be to upset I would like to move my surgery to after Christmas. I am moving and have family stuff going on. And I just don't want to be sick for the holiday. Can my surgery be moved to after Christmas. Plus with my stomach so big due to the fibroids; can Dr Berline Lopes take them out also at the time of the surgery? I will still keep taking the medicine Dr Berline Lopes wants me to take." Explained to the patient that Dr Berline Lopes surgery schedule is booked out until the end of January. Patient voiced understanding.   Patient asked to be transferred to Dr Rob Hickman desk RN

## 2022-11-19 ENCOUNTER — Other Ambulatory Visit (HOSPITAL_COMMUNITY): Payer: Self-pay

## 2022-11-19 ENCOUNTER — Telehealth: Payer: Self-pay | Admitting: Adult Health

## 2022-11-19 ENCOUNTER — Telehealth: Payer: Self-pay | Admitting: Surgery

## 2022-11-19 DIAGNOSIS — M170 Bilateral primary osteoarthritis of knee: Secondary | ICD-10-CM | POA: Diagnosis not present

## 2022-11-19 NOTE — Telephone Encounter (Signed)
Rescheduled appointment per provider template. Left voicemail.

## 2022-11-19 NOTE — Telephone Encounter (Signed)
Scheduled f/u with Dr Berline Lopes and preop with [facility] for 2/8. Called patient and informed her of new surgery date of 2/14 and appointments on 2/8. Emphasized to patient importance of not postponing this surgery and how it is not best for her health and causes challenges for our other patients. Patient verbalized understanding and stated she would not cancel this time. Patient had no other concerns at this time.

## 2022-11-20 DIAGNOSIS — M170 Bilateral primary osteoarthritis of knee: Secondary | ICD-10-CM | POA: Diagnosis not present

## 2022-11-21 DIAGNOSIS — M170 Bilateral primary osteoarthritis of knee: Secondary | ICD-10-CM | POA: Diagnosis not present

## 2022-11-22 ENCOUNTER — Telehealth: Payer: Self-pay | Admitting: Hematology and Oncology

## 2022-11-22 DIAGNOSIS — M170 Bilateral primary osteoarthritis of knee: Secondary | ICD-10-CM | POA: Diagnosis not present

## 2022-11-22 NOTE — Telephone Encounter (Signed)
Scheduled appointment per 12/4 staff message. Patient is aware.

## 2022-11-23 ENCOUNTER — Inpatient Hospital Stay: Payer: Medicaid Other | Admitting: Gynecologic Oncology

## 2022-11-23 DIAGNOSIS — M170 Bilateral primary osteoarthritis of knee: Secondary | ICD-10-CM | POA: Diagnosis not present

## 2022-11-24 DIAGNOSIS — M170 Bilateral primary osteoarthritis of knee: Secondary | ICD-10-CM | POA: Diagnosis not present

## 2022-11-25 ENCOUNTER — Encounter (HOSPITAL_COMMUNITY): Admission: RE | Admit: 2022-11-25 | Payer: Medicaid Other | Source: Ambulatory Visit

## 2022-11-25 DIAGNOSIS — M170 Bilateral primary osteoarthritis of knee: Secondary | ICD-10-CM | POA: Diagnosis not present

## 2022-11-26 DIAGNOSIS — M170 Bilateral primary osteoarthritis of knee: Secondary | ICD-10-CM | POA: Diagnosis not present

## 2022-11-27 DIAGNOSIS — M170 Bilateral primary osteoarthritis of knee: Secondary | ICD-10-CM | POA: Diagnosis not present

## 2022-11-28 DIAGNOSIS — M170 Bilateral primary osteoarthritis of knee: Secondary | ICD-10-CM | POA: Diagnosis not present

## 2022-11-29 ENCOUNTER — Other Ambulatory Visit: Payer: Medicaid Other

## 2022-11-29 ENCOUNTER — Other Ambulatory Visit (HOSPITAL_COMMUNITY): Payer: Self-pay

## 2022-11-29 ENCOUNTER — Other Ambulatory Visit: Payer: Self-pay | Admitting: Hematology and Oncology

## 2022-11-29 ENCOUNTER — Ambulatory Visit: Payer: Medicaid Other | Admitting: Student

## 2022-11-29 DIAGNOSIS — C50412 Malignant neoplasm of upper-outer quadrant of left female breast: Secondary | ICD-10-CM

## 2022-11-29 DIAGNOSIS — M170 Bilateral primary osteoarthritis of knee: Secondary | ICD-10-CM | POA: Diagnosis not present

## 2022-11-29 MED ORDER — METHADONE HCL 5 MG PO TABS
15.0000 mg | ORAL_TABLET | Freq: Three times a day (TID) | ORAL | 0 refills | Status: DC | PRN
  Filled 2022-11-29 – 2022-12-02 (×2): qty 270, 30d supply, fill #0

## 2022-11-29 NOTE — Patient Instructions (Signed)
Visit Information  [name] was given information about Medicaid Managed Care team care coordination services as a part of their [place] Medicaid benefit. [name] verbally consented to engagement with the [facility] Managed Care team.   If you are experiencing a medical emergency, please call 911 or report to your local emergency department or urgent care.   If you have a non-emergency medical problem during routine business hours, please contact your provider's office and ask to speak with a nurse.   For questions related to your [facility], please call: [phone] or visit the homepage here: [email or web address]  If you would like to schedule transportation through your [facility], please call the following number at least 2 days in advance of your appointment: [phone]   Rides for urgent appointments can also be made after hours by calling Member Services.  Call the [place] at [phone], at any time, 24 hours a day, 7 days a week. If you are in danger or need immediate medical attention call 911.  If you would like help to quit smoking, call 1-800-QUIT-NOW (848)203-2247) OR Espaol: 1-855-Djelo-Ya (6-834-196-2229) o para ms informacin haga clic aqu or Text READY to 200-400 to register via text  [name] - following are the goals we discussed in your visit today:   Goals Addressed   None       Social Worker will follow up in 30 days.   [name], [name], [name]  High Risk Managed Medicaid Team  [phone]   Following is a copy of your plan of care:  Care Plan : [place] of Care  Updates made by Ethelda Chick since 11/29/2022 12:00 AM     Problem: Health Mangement needs related to CHF      Long-Range Goal: Development of Plan of Care to address  Health Mangement needs related to CHF   Start Date: 07/16/2022  Expected End Date: 12/17/2022  Priority: High  Note:   Current Barriers:  Chronic Disease Management support and education needs related to CHF-[name] is scheduled for outpatient surgery for hysterectomy on 12/02/22. She has transportation and has someone to be with her following the surgery. She denies any needs today.  RNCM Clinical Goal(s):  Patient will verbalize understanding of plan for management of CHF as evidenced by Patient reports take all medications exactly as prescribed and will call provider for medication related questions as evidenced by patient reports and EMR documentation    attend all scheduled medical appointments: 11/23/22 Pre Op, 11/25/22 for Pre Admission testing, 11/29/22 with [name], 12/02/22 for Outpatient Surgery and 12/06/22 with Cardiology as evidenced by provider documentation           Interventions: Inter-disciplinary care team collaboration (see longitudinal plan of care) Evaluation of current treatment plan related to  self management and patient's adherence to plan as established by provider Review of upcoming appointments including:11/23/22 Pre Op, 11/25/22 for Pre Admission testing,11/29/22 with [name], 12/02/22 for Outpatient surgery and 12/06/22 with Cardiology [name] completed a telephone outreach with patient. Patient stated she was in the process of getting her floors fixed in her home. Patient states everything is going well. No resources/needs right now. Patient states her surgery has been moved to feb 2024.   Heart Failure Interventions:  (Status: Goal on Track (progressing): YES.)  Long Term Goal  Wt Readings from Last 3 Encounters:  10/19/22 240  lb 12.8 oz (109.2 kg)  10/14/22 235 lb 0.2 oz (106.6 kg)  09/09/22 235 lb (106.6 kg)   Provided education on low sodium diet Discussed importance of daily weight and advised patient to weigh and record daily Reviewed role of diuretics in  prevention of fluid overload and management of heart failure Discussed the importance of keeping all appointments with provider Reviewed medications and advised patient to request refills prior to running out of medication Advised patient to take all medications with her to PreOp appointments SDOH assessment   Patient Goals/Self-Care Activities: Take medications as prescribed   Attend all scheduled provider appointments Call provider office for new concerns or questions  Work with the social worker to address care coordination needs and will continue to work with the clinical team to address health care and disease management related needs call office if I gain more than 2 pounds in one day or 5 pounds in one week track weight in diary use salt in moderation weigh myself daily follow rescue plan if symptoms flare-up

## 2022-11-29 NOTE — Patient Outreach (Signed)
Medicaid Managed Care Social Work Note  11/29/2022 Name:  [name] MRN:  [id number] DOB:  1961-02-14  [name] is an 61 y.o. year old female who is a primary patient of Orvis Brill, DO.  The Medicaid Managed Care Coordination team was consulted for assistance with:  Community Resources   [name] was given information about Medicaid Managed Care Coordination team services today. [name] Patient agreed to services and verbal consent obtained.  Engaged with patient  for by telephone forfollow up visit in response to referral for case management and/or care coordination services.   Assessments/Interventions:  Review of past medical history, allergies, medications, health status, including review of consultants reports, laboratory and other test data, was performed as part of comprehensive evaluation and provision of chronic care management services.  SDOH: (Social Determinant of Health) assessments and interventions performed: SDOH Interventions    Flowsheet Row Patient Outreach Telephone from 11/10/2022 in [place] Patient Outreach Telephone from 08/27/2022 in [place] Patient Outreach Telephone from 07/16/2022 in [place] Patient Outreach Telephone from 09/21/2021 in [place] Patient Outreach Telephone from 07/28/2021 in [place] Interventions       Food Insecurity Interventions Intervention Not Indicated -- Intervention Not Indicated Other (Comment) Intervention Not Indicated  Housing Interventions -- -- Intervention Not Indicated -- Intervention Not Indicated  Transportation Interventions Intervention Not Indicated Other (Comment)  [Provided patient with [facility] medical transportation [phone] Intervention Not Indicated -- Intervention Not Indicated   Financial Strain Interventions -- -- -- -- Intervention Not Indicated  Physical Activity Interventions -- -- -- -- Intervention Not Indicated  Stress Interventions -- -- -- -- Intervention Not Indicated  Social Connections Interventions -- -- -- -- Intervention Not Indicated     BSW completed a telephone outreach with patient. Patient stated she was in the process of getting her floors fixed in her home. Patient states everything is going well. No resources/needs right now. Patient states her surgery has been moved to feb 2024  Advanced Directives Status:  Not addressed in this encounter.  Care Plan                 No Known Allergies  Medications Reviewed Today     Reviewed by Melissa Montane, RN (Registered Nurse) on 11/10/22 at [phone]  Med List Status: <None>   Medication Order Taking? Sig Documenting Provider Last Dose Status Informant  acetaminophen (TYLENOL) 325 MG tablet [id number] Yes Take 2 tablets (650 mg total) by mouth every 6 (six) hours as needed for mild pain, moderate pain or headache. Ezequiel Essex, MD Taking Active Self  allopurinol (ZYLOPRIM) 300 MG tablet [id number] Yes Take 1 tablet (300 mg total) by mouth daily. [name], MD Taking Active Self  ascorbic acid (VITAMIN C) 500 MG tablet [id number] Yes Take 500 mg by mouth daily. [provider] Taking Active Self  aspirin EC 81 MG tablet [id number] Yes Take 81 mg by mouth daily. [provider] Taking Active Self  atorvastatin (LIPITOR) 40 MG tablet [id number] Yes TAKE 1 TABLET BY MOUTH DAILY AT 6 PM. [name], MD Taking Active Self  carvedilol (COREG) 25 MG tablet [id number] Yes Take 1 tablet (25 mg total) by mouth 2 (two) times daily with a meal. Orvis Brill, DO Taking Active Self  cholecalciferol (VITAMIN D3) 25 MCG (1000 UNIT) tablet [id number] Yes Take 1,000 Units by  mouth daily. [provider] Taking Active Self  empagliflozin (JARDIANCE) 10 MG TABS tablet [id number] Yes Take 1  tablet (10 mg total) by mouth daily. Orvis Brill, DO Taking Active Self  ezetimibe (ZETIA) 10 MG tablet [id number] Yes Take 1 tablet (10 mg total) by mouth daily. Orvis Brill, DO Taking Active Self    Discontinued 03/10/12 1404 (Error)   fluticasone (FLONASE) 50 MCG/ACT nasal spray [id number] Yes Place 1 spray into both nostrils daily.  Patient taking differently: Place 1 spray into both nostrils daily as needed for allergies.   Ezequiel Essex, MD Taking Active Self  furosemide (LASIX) 20 MG tablet [id number] Yes Take 1 tablet (20 mg total) by mouth daily. TAKE AN EXTRA 20 MG IF WGT GAIN 3 LBS IN 24 HOURS OR 5 LBS IN A WEEK Dick, Junius Creamer., NP Taking Active Self  isosorbide mononitrate (IMDUR) 60 MG 24 hr tablet [id number] Yes Take 1 tablet (60 mg total) by mouth at bedtime. [name], MD Taking Active Self  medroxyPROGESTERone (PROVERA) 10 MG tablet [id number] Yes Take 2 tablets (20 mg total) by mouth daily. Joylene John D, NP Taking Active Self  methadone (DOLOPHINE) 5 MG tablet [id number] Yes Take 3 tablets by mouth every 8  hours as needed Iruku, Arletha Pili, MD Taking Active   nitroGLYCERIN (NITROSTAT) 0.4 MG SL tablet [id number] Yes Place 1 tablet (0.4 mg total) under the tongue every 5 (five) minutes as needed for chest pain. [name], MD Taking Active Self  potassium chloride SA (KLOR-CON M20) 20 MEQ tablet [id number] Yes Take 1 tablet (20 mEq total) by mouth 3 (three) times daily. 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   Tension headache 03/31/2022   Chest  pain 03/30/2022   Hypomagnesemia 03/30/2022    Class: Acute   Constipation 03/30/2022   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   BMI 40.0-44.9, adult ([place]) 10/26/2021   COVID-19 vaccine administered 07/27/2021   Prediabetes 01/01/2021   Concern about ear disease without diagnosis 01/01/2021   Neck mass 11/05/2020   Arthritis of right knee 06/11/2020   Acquired absence of both breasts 04/01/2020   Bilateral leg edema 06/21/2019   Dysfunctional uterine bleeding 04/30/2019   Right leg pain 09/13/2018   Acquired trigger finger of right ring finger 05/29/2018   Hyperlipidemia LDL goal <70 02/08/2018   History of ST elevation myocardial infarction (STEMI) 12/01/2017   Acute ST segment elevation myocardial infarction ([place]) 12/01/2017   Endometrial hyperplasia, simple 11/08/2017   Unilateral primary osteoarthritis, right knee 10/27/2017   Essential hypertension 04/17/2016   CAD (coronary artery disease) 04/17/2016   Shortness of breath    Thrombocytosis 03/10/2015   Tobacco abuse 12/02/2014   Cancer of overlapping sites of right female breast ([place]) 09/16/2014   Breast cancer of upper-outer quadrant of left female breast ([place]) 09/16/2014   Hot flashes due to tamoxifen 05/09/2014   Lymphedema of arm 11/08/2013   Hypokalemia 03/31/2012   Atherosclerosis of aorta (HCC)     Conditions to be addressed/monitored per PCP order:   community  resources  Care Plan : RN Care Manager Plan of Care  Updates made by Ethelda Chick since 11/29/2022 12:00 AM     Problem: Health Mangement needs related to CHF      Long-Range Goal: Development of Plan of Care to address Health Mangement needs related to CHF   Start Date: 07/16/2022  Expected End Date: 12/17/2022  Priority: High  Note:   Current Barriers:  Chronic Disease Management support and education needs related to CHF-[name] is scheduled for outpatient surgery for hysterectomy on 12/02/22. She has transportation and has  someone to be with her following the surgery. She denies any needs today.  RNCM Clinical Goal(s):  Patient will verbalize understanding of plan for management of CHF as evidenced by Patient reports take all medications exactly as prescribed and will call provider for medication related questions as evidenced by patient reports and EMR documentation    attend all scheduled medical appointments: 11/23/22 Pre Op, 11/25/22 for Pre Admission testing, 11/29/22 with BSW, 12/02/22 for Outpatient Surgery and 12/06/22 with Cardiology as evidenced by provider documentation           Interventions: Inter-disciplinary care team collaboration (see longitudinal plan of care) Evaluation of current treatment plan related to  self management and patient's adherence to plan as established by provider Review of upcoming appointments including:11/23/22 Pre Op, 11/25/22 for Pre Admission testing,11/29/22 with BSW, 12/02/22 for Outpatient surgery and 12/06/22 with Cardiology BSW completed a telephone outreach with patient. Patient stated she was in the process of getting her floors fixed in her home. Patient states everything is going well. No resources/needs right now. Patient states her surgery has been moved to feb 2024.   Heart Failure Interventions:  (Status: Goal on Track (progressing): YES.)  Long Term Goal  Wt Readings from Last 3 Encounters:  10/19/22 240 lb 12.8 oz (109.2 kg)  10/14/22 235 lb 0.2 oz (106.6 kg)  09/09/22 235 lb (106.6 kg)   Provided education on low sodium diet Discussed importance of daily weight and advised patient to weigh and record daily Reviewed role of diuretics in prevention of fluid overload and management of heart failure Discussed the importance of keeping all appointments with provider Reviewed medications and advised patient to request refills prior to running out of medication Advised patient to take all medications with her to PreOp appointments SDOH assessment   Patient  Goals/Self-Care Activities: Take medications as prescribed   Attend all scheduled provider appointments Call provider office for new concerns or questions  Work with the social worker to address care coordination needs and will continue to work with the clinical team to address health care and disease management related needs call office if I gain more than 2 pounds in one day or 5 pounds in one week track weight in diary use salt in moderation weigh myself daily follow rescue plan if symptoms flare-up       Follow up:  Patient agrees to Care Plan and Follow-up.  Plan: The Managed Medicaid care management team will reach out to the patient again over the next 30 days.  Date/time of next scheduled Social Work care management/care coordination outreach:  12/30/22  Mickel Fuchs, Arita Miss, [place] Medicaid Team  [phone]

## 2022-11-30 ENCOUNTER — Other Ambulatory Visit (HOSPITAL_COMMUNITY): Payer: Self-pay

## 2022-11-30 ENCOUNTER — Ambulatory Visit: Payer: Medicaid Other | Admitting: Hematology and Oncology

## 2022-11-30 DIAGNOSIS — M170 Bilateral primary osteoarthritis of knee: Secondary | ICD-10-CM | POA: Diagnosis not present

## 2022-12-01 DIAGNOSIS — M170 Bilateral primary osteoarthritis of knee: Secondary | ICD-10-CM | POA: Diagnosis not present

## 2022-12-02 ENCOUNTER — Other Ambulatory Visit (HOSPITAL_COMMUNITY): Payer: Self-pay

## 2022-12-02 ENCOUNTER — Other Ambulatory Visit: Payer: Self-pay

## 2022-12-02 DIAGNOSIS — N8502 Endometrial intraepithelial neoplasia [EIN]: Secondary | ICD-10-CM

## 2022-12-02 DIAGNOSIS — M170 Bilateral primary osteoarthritis of knee: Secondary | ICD-10-CM | POA: Diagnosis not present

## 2022-12-03 DIAGNOSIS — M170 Bilateral primary osteoarthritis of knee: Secondary | ICD-10-CM | POA: Diagnosis not present

## 2022-12-04 DIAGNOSIS — M170 Bilateral primary osteoarthritis of knee: Secondary | ICD-10-CM | POA: Diagnosis not present

## 2022-12-05 DIAGNOSIS — M170 Bilateral primary osteoarthritis of knee: Secondary | ICD-10-CM | POA: Diagnosis not present
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
## 2022-12-06 ENCOUNTER — Ambulatory Visit: Payer: Medicaid Other | Admitting: Interventional Cardiology

## 2022-12-06 ENCOUNTER — Ambulatory Visit (INDEPENDENT_AMBULATORY_CARE_PROVIDER_SITE_OTHER): Payer: Medicaid Other | Admitting: Student

## 2022-12-06 ENCOUNTER — Inpatient Hospital Stay: Payer: Medicaid Other | Attending: Adult Health | Admitting: Hematology and Oncology

## 2022-12-06 ENCOUNTER — Encounter: Payer: Self-pay | Admitting: Hematology and Oncology

## 2022-12-06 ENCOUNTER — Encounter: Payer: Self-pay | Admitting: Student

## 2022-12-06 VITALS — BP 162/86 | HR 77 | Temp 97.5°F | Resp 22 | Wt 237.7 lb

## 2022-12-06 VITALS — BP 130/78 | HR 82 | Ht 65.0 in | Wt 237.0 lb

## 2022-12-06 DIAGNOSIS — M170 Bilateral primary osteoarthritis of knee: Secondary | ICD-10-CM | POA: Diagnosis not present

## 2022-12-06 DIAGNOSIS — M79605 Pain in left leg: Secondary | ICD-10-CM | POA: Diagnosis not present

## 2022-12-06 DIAGNOSIS — I10 Essential (primary) hypertension: Secondary | ICD-10-CM

## 2022-12-06 DIAGNOSIS — Z79810 Long term (current) use of selective estrogen receptor modulators (SERMs): Secondary | ICD-10-CM | POA: Diagnosis not present

## 2022-12-06 DIAGNOSIS — Z720 Tobacco use: Secondary | ICD-10-CM

## 2022-12-06 DIAGNOSIS — I2510 Atherosclerotic heart disease of native coronary artery without angina pectoris: Secondary | ICD-10-CM

## 2022-12-06 DIAGNOSIS — Z170 Estrogen receptor positive status [ER+]: Secondary | ICD-10-CM | POA: Diagnosis not present

## 2022-12-06 DIAGNOSIS — M792 Neuralgia and neuritis, unspecified: Secondary | ICD-10-CM | POA: Diagnosis not present

## 2022-12-06 DIAGNOSIS — Z853 Personal history of malignant neoplasm of breast: Secondary | ICD-10-CM | POA: Diagnosis not present

## 2022-12-06 DIAGNOSIS — C50412 Malignant neoplasm of upper-outer quadrant of left female breast: Secondary | ICD-10-CM | POA: Diagnosis present

## 2022-12-06 DIAGNOSIS — E785 Hyperlipidemia, unspecified: Secondary | ICD-10-CM

## 2022-12-06 DIAGNOSIS — M79604 Pain in right leg: Secondary | ICD-10-CM

## 2022-12-06 DIAGNOSIS — G8929 Other chronic pain: Secondary | ICD-10-CM

## 2022-12-06 DIAGNOSIS — I5032 Chronic diastolic (congestive) heart failure: Secondary | ICD-10-CM

## 2022-12-06 DIAGNOSIS — C50811 Malignant neoplasm of overlapping sites of right female breast: Secondary | ICD-10-CM | POA: Diagnosis not present

## 2022-12-06 DIAGNOSIS — N8502 Endometrial intraepithelial neoplasia [EIN]: Secondary | ICD-10-CM | POA: Insufficient documentation

## 2022-12-06 MED ORDER — GABAPENTIN 100 MG PO CAPS: 100.0000 mg | ORAL_CAPSULE | Freq: Every day | ORAL | 0 refills | Status: DC

## 2022-12-06 MED ORDER — FUROSEMIDE 20 MG PO TABS: 20.0000 mg | ORAL_TABLET | Freq: Every day | ORAL | 2 refills | Status: DC

## 2022-12-06 MED ORDER — CARVEDILOL 25 MG PO TABS: 25.0000 mg | ORAL_TABLET | Freq: Two times a day (BID) | ORAL | 1 refills | Status: DC

## 2022-12-06 MED ORDER — POTASSIUM CHLORIDE CRYS ER 20 MEQ PO TBCR: 20.0000 meq | EXTENDED_RELEASE_TABLET | Freq: Three times a day (TID) | ORAL | 0 refills | Status: DC

## 2022-12-06 MED ORDER — ATORVASTATIN CALCIUM 40 MG PO TABS: ORAL_TABLET | ORAL | 3 refills | Status: DC

## 2022-12-06 MED ORDER — ISOSORBIDE MONONITRATE ER 60 MG PO TB24: 60.0000 mg | ORAL_TABLET | Freq: Every day | ORAL | 3 refills | Status: DC

## 2022-12-06 MED ORDER — ALLOPURINOL 300 MG PO TABS: 300.0000 mg | ORAL_TABLET | Freq: Every day | ORAL | 2 refills | Status: DC

## 2022-12-06 MED ORDER — EMPAGLIFLOZIN 10 MG PO TABS: 10.0000 mg | ORAL_TABLET | Freq: Every day | ORAL | 1 refills | Status: DC

## 2022-12-06 MED ORDER — EZETIMIBE 10 MG PO TABS: 10.0000 mg | ORAL_TABLET | Freq: Every day | ORAL | 1 refills | Status: DC

## 2022-12-06 MED ORDER — ASPIRIN 81 MG PO TBEC: 81.0000 mg | DELAYED_RELEASE_TABLET | Freq: Every day | ORAL | 12 refills | Status: DC

## 2022-12-06 NOTE — Progress Notes (Signed)
    SUBJECTIVE:   CHIEF COMPLAINT / HPI:   Having sinus issues Was supposed to go to ENT  Did not go to cardiology appt this morning  Leg soreness  PERTINENT  PMH / PSH: ***  OBJECTIVE:   BP 130/78   Pulse 82   Ht '5\' 5"'$  (1.651 m)   Wt 237 lb (107.5 kg)   LMP 08/07/2011   SpO2 99%   BMI 39.44 kg/m  ***   ASSESSMENT/PLAN:   No problem-specific Assessment & Plan notes found for this encounter.     Orvis Brill, [place]    {    This will disappear when note is signed, click to select method of visit    :1}
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
## 2022-12-06 NOTE — Patient Instructions (Signed)
It was great seeing you today.  I refilled your medicines.  Start taking Gabapentin at nighttime.  I will see you in February before your surgery- call and get this appointment made.   If you have any questions or concerns, please feel free to call the clinic.   Have a wonderful day,  Dr. Orvis Brill [facility] [phone]

## 2022-12-07 DIAGNOSIS — M79604 Pain in right leg: Secondary | ICD-10-CM | POA: Insufficient documentation

## 2022-12-07 DIAGNOSIS — M170 Bilateral primary osteoarthritis of knee: Secondary | ICD-10-CM | POA: Diagnosis not present

## 2022-12-07 NOTE — Assessment & Plan Note (Signed)
BP within goal limits today at 130/78. Continue current regimen. Last BMP 09/2022, not due for repeat blood work.

## 2022-12-07 NOTE — Assessment & Plan Note (Signed)
Lower extremities generally normal-appearing. No concern for CHF exacerbation. Likely neuropathy, especially in setting of history of chemotherapy for breast cancer. Will resume gabapentin 100 mg nightly.

## 2022-12-08 ENCOUNTER — Other Ambulatory Visit: Payer: Medicaid Other | Admitting: *Deleted

## 2022-12-08 DIAGNOSIS — M170 Bilateral primary osteoarthritis of knee: Secondary | ICD-10-CM | POA: Diagnosis not present

## 2022-12-08 NOTE — Patient Outreach (Signed)
Care Coordination  12/08/2022  [name] Jun 18, 1961 [id number]   Successful outreach with [name] today. However, she is unable to keep this telephone appointment and request to reschedule. A new appointment was scheduled for 12/24/22 @ 10:30am. [name] agreed to new date and time.   [name] RN, BSN [place]  [employer] manager

## 2022-12-09 ENCOUNTER — Ambulatory Visit: Payer: Medicaid Other | Admitting: Hematology and Oncology

## 2022-12-09 DIAGNOSIS — M170 Bilateral primary osteoarthritis of knee: Secondary | ICD-10-CM | POA: Diagnosis not present

## 2022-12-10 DIAGNOSIS — M170 Bilateral primary osteoarthritis of knee: Secondary | ICD-10-CM | POA: Diagnosis not present

## 2022-12-11 DIAGNOSIS — M170 Bilateral primary osteoarthritis of knee: Secondary | ICD-10-CM | POA: Diagnosis not present

## 2022-12-12 DIAGNOSIS — M170 Bilateral primary osteoarthritis of knee: Secondary | ICD-10-CM | POA: Diagnosis not present

## 2022-12-13 DIAGNOSIS — M170 Bilateral primary osteoarthritis of knee: Secondary | ICD-10-CM | POA: Diagnosis not present

## 2022-12-14 DIAGNOSIS — M170 Bilateral primary osteoarthritis of knee: Secondary | ICD-10-CM | POA: Diagnosis not present

## 2022-12-15 DIAGNOSIS — M170 Bilateral primary osteoarthritis of knee: Secondary | ICD-10-CM | POA: Diagnosis not present

## 2022-12-16 DIAGNOSIS — M170 Bilateral primary osteoarthritis of knee: Secondary | ICD-10-CM | POA: Diagnosis not present

## 2022-12-17 DIAGNOSIS — M170 Bilateral primary osteoarthritis of knee: Secondary | ICD-10-CM | POA: Diagnosis not present

## 2022-12-18 DIAGNOSIS — M170 Bilateral primary osteoarthritis of knee: Secondary | ICD-10-CM | POA: Diagnosis not present

## 2022-12-19 DIAGNOSIS — M170 Bilateral primary osteoarthritis of knee: Secondary | ICD-10-CM | POA: Diagnosis not present

## 2022-12-20 DIAGNOSIS — M170 Bilateral primary osteoarthritis of knee: Secondary | ICD-10-CM | POA: Diagnosis not present

## 2022-12-23 ENCOUNTER — Encounter: Payer: Medicaid Other | Admitting: Adult Health

## 2022-12-24 ENCOUNTER — Encounter: Payer: Self-pay | Admitting: *Deleted

## 2022-12-24 ENCOUNTER — Other Ambulatory Visit: Payer: Medicaid Other | Admitting: *Deleted

## 2022-12-24 NOTE — Patient Outreach (Signed)
Medicaid Managed Care   Nurse Care Manager Note  12/24/2022 Name:  [name] MRN:  [id number] DOB:  February 13, 1961  [name] is an 62 y.o. year old female who is a primary [name] of Orvis Brill, DO.  The [facility] Managed Care Coordination team was consulted for assistance with:    CHF  [name] was given information about Medicaid Managed Care Coordination team services today. [name] agreed to services and verbal consent obtained.  Engaged with [name] by telephone for follow up visit in response to provider referral for case management and/or care coordination services.   Assessments/Interventions:  Review of past medical history, allergies, medications, health status, including review of consultants reports, laboratory and other test data, was performed as part of comprehensive evaluation and provision of chronic care management services.  SDOH (Social Determinants of Health) assessments and interventions performed: SDOH Interventions    Flowsheet [place] [name] Outreach Telephone from 12/24/2022 in [place] [name] Outreach Telephone from 11/10/2022 in [place] [name] Outreach Telephone from 08/27/2022 in [place] Coordination [name] Outreach Telephone from 07/16/2022 in [place] [name] Outreach Telephone from 09/21/2021 in [place] [name] Outreach Telephone from 07/28/2021 in [place] Interventions        Food Insecurity Interventions -- Intervention Not Indicated -- Intervention Not Indicated Other (Comment) Intervention Not Indicated  Housing Interventions Intervention Not Indicated -- -- Intervention Not Indicated -- Intervention Not Indicated  Transportation Interventions Intervention Not Indicated Intervention Not Indicated Other  (Comment)  [Provided [name] with [facility] medical transportation [phone] Intervention Not Indicated -- Intervention Not Indicated  Financial Strain Interventions -- -- -- -- -- Intervention Not Indicated  Physical Activity Interventions -- -- -- -- -- Intervention Not Indicated  Stress Interventions -- -- -- -- -- Intervention Not Indicated  Social Connections Interventions -- -- -- -- -- Intervention Not Indicated       Care Plan  No Known Allergies  Medications Reviewed Today     Reviewed by Melissa Montane, RN (Registered Nurse) on 12/24/22 at [place] Status: <None>   Medication Order Taking? Sig Documenting Provider Last Dose Status Informant  acetaminophen (TYLENOL) 325 MG tablet [id number] Yes Take 2 tablets (650 mg total) by mouth every 6 (six) hours as needed for mild pain, moderate pain or headache. Ezequiel Essex, MD Taking Active Self  allopurinol (ZYLOPRIM) 300 MG tablet [id number] Yes Take 1 tablet (300 mg total) by mouth daily. Orvis Brill, DO Taking Active   ascorbic acid (VITAMIN C) 500 MG tablet [id number]  Take 500 mg by mouth daily. [provider]  Active Self  aspirin EC 81 MG tablet [id number]  Take 81 mg by mouth daily. [provider]  Active Self  aspirin EC 81 MG tablet [id number]  Take 1 tablet (81 mg total) by mouth daily. Swallow whole. Orvis Brill, DO  Active   atorvastatin (LIPITOR) 40 MG tablet [id number] Yes TAKE 1 TABLET BY MOUTH DAILY AT 6 PM. Orvis Brill, DO Taking Active   carvedilol (COREG) 25 MG tablet [id number] Yes Take 1 tablet (25 mg total) by mouth 2 (two) times daily with a meal. Dameron, Luna Fuse, DO Taking Active   cholecalciferol (VITAMIN D3) 25 MCG (1000 UNIT) tablet [id number] Yes Take 1,000 Units by mouth daily. [provider] Taking Active Self  empagliflozin (JARDIANCE) 10 MG TABS tablet  [id number] Yes Take 1 tablet (10 mg total) by mouth daily. Orvis Brill, DO Taking Active   ezetimibe  (ZETIA) 10 MG tablet [id number] Yes Take 1 tablet (10 mg total) by mouth daily. Orvis Brill, DO Taking Active     Discontinued 03/10/12 1404 (Error)   fluticasone (FLONASE) 50 MCG/ACT nasal spray [id number] Yes Place 1 spray into both nostrils daily.  [name] taking differently: Place 1 spray into both nostrils daily as needed for allergies.   Ezequiel Essex, MD Taking Active Self  furosemide (LASIX) 20 MG tablet [id number] Yes Take 1 tablet (20 mg total) by mouth daily. TAKE AN EXTRA 20 MG IF WGT GAIN 3 LBS IN 24 HOURS OR 5 LBS IN A WEEK Dameron, Marisa, DO Taking Active   gabapentin (NEURONTIN) 100 MG capsule [id number] Yes Take 1 capsule (100 mg total) by mouth at bedtime. Orvis Brill, DO Taking Active   isosorbide mononitrate (IMDUR) 60 MG 24 hr tablet [id number] Yes Take 1 tablet (60 mg total) by mouth at bedtime. Orvis Brill, DO Taking Active   medroxyPROGESTERone (PROVERA) 10 MG tablet [id number] Yes Take 2 tablets (20 mg total) by mouth daily. Joylene John D, NP Taking Active Self  methadone (DOLOPHINE) 5 MG tablet [id number] Yes Take 3 tablets (15 mg) by mouth every 8  hours as needed Benay Pike, MD Taking Active   nitroGLYCERIN (NITROSTAT) 0.4 MG SL tablet [id number] Yes Place 1 tablet (0.4 mg total) under the tongue every 5 (five) minutes as needed for chest pain. [name], MD Taking Active Self  potassium chloride SA (KLOR-CON M20) 20 MEQ tablet [id number] Yes Take 1 tablet (20 mEq total) by mouth 3 (three) times daily. 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   Tension headache 03/31/2022   Chest pain 03/30/2022   Hypomagnesemia 03/30/2022    Class: Acute   Constipation 03/30/2022   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   BMI 40.0-44.9, adult ([place]) 10/26/2021   COVID-19 vaccine administered 07/27/2021   Prediabetes 01/01/2021   Concern about ear disease without diagnosis 01/01/2021   Neck mass 11/05/2020   Arthritis of right knee 06/11/2020   Acquired absence of both breasts 04/01/2020   Bilateral leg edema 06/21/2019   Dysfunctional uterine bleeding 04/30/2019   Right leg pain 09/13/2018   Acquired trigger finger of right ring finger 05/29/2018   Hyperlipidemia LDL goal <70 02/08/2018   History of ST elevation myocardial infarction (STEMI) 12/01/2017   Acute ST segment elevation myocardial infarction ([place]) 12/01/2017   Endometrial hyperplasia, simple 11/08/2017   Unilateral primary osteoarthritis, right knee 10/27/2017   Essential hypertension 04/17/2016   CAD (coronary artery disease) 04/17/2016   Shortness of breath    Thrombocytosis 03/10/2015   Tobacco abuse 12/02/2014   Cancer of overlapping sites of right female breast ([place]) 09/16/2014   Breast cancer of upper-outer quadrant of left female breast ([place]) 09/16/2014   Hot flashes due to tamoxifen 05/09/2014   Lymphedema of arm 11/08/2013   Hypokalemia 03/31/2012   Atherosclerosis of aorta (  [place])     Conditions to be addressed/monitored per PCP order:  CHF  Care Plan : RN Care Manager Plan of Care  Updates made by Melissa Montane, RN since 12/24/2022 12:00 AM     Problem: Health Mangement needs related to CHF      Long-Range Goal: Development of Plan of Care to address Health Mangement needs related to CHF   Start Date: 07/16/2022  Expected End Date: 02/17/2023  Priority: High  Note:   Current Barriers:  Chronic Disease Management support and education needs related to CHF-Ms. Lybrand is scheduled for outpatient surgery for  hysterectomy on 02/02/23. She has transportation and has someone to be with her following the surgery. She denies any needs today.  RNCM Clinical Goal(s):  [name] will verbalize understanding of plan for management of CHF as evidenced by [name] reports take all medications exactly as prescribed and will call provider for medication related questions as evidenced by [name] reports and EMR documentation    attend all scheduled medical appointments: 12/31/22 with PCP and Oncology, 01/27/22 Dr. Berline Lopes and Pre Op, 02/02/23 for Outpatient Surgery  as evidenced by provider documentation           Interventions: Inter-disciplinary care team collaboration (see longitudinal plan of care) Evaluation of current treatment plan related to  self management and [name]'s adherence to plan as established by provider Review of upcoming appointments including:12/31/22 with PCP and Oncology, 01/27/23 with Dr. Berline Lopes and Pre Op, 02/02/23 for Outpatient surgery    Heart Failure Interventions:  (Status: Goal on Track (progressing): YES.)  Long Term Goal  Wt Readings from Last 3 Encounters:  12/06/22 237 lb (107.5 kg)  12/06/22 237 lb 11.2 oz (107.8 kg)  10/19/22 240 lb 12.8 oz (109.2 kg)   Provided education on low sodium diet Discussed importance of daily weight and advised [name] to weigh and record daily Reviewed role of diuretics in prevention of fluid overload and management of heart failure Discussed the importance of keeping all appointments with provider Reviewed medications and advised [name] to request refills prior to running out of medication Advised [name] to take all medications with her to PreOp appointments SDOH assessment   [name] Goals/Self-Care Activities: Take medications as prescribed   Attend all scheduled provider appointments Call provider office for new concerns or questions  Work with the social worker to address care coordination needs and will continue to work with the clinical team  to address health care and disease management related needs call office if I gain more than 2 pounds in one day or 5 pounds in one week track weight in diary use salt in moderation weigh myself daily follow rescue plan if symptoms flare-up       Follow Up:  [name] agrees to Care Plan and Follow-up.  Plan: The Managed Medicaid care management team will reach out to the [name] again over the next 45 days.  Date/time of next scheduled RN care management/care coordination outreach:  02/08/23 @ 10:30am  [name] RN, BSN [place] RN Care Coordinator

## 2022-12-24 NOTE — Patient Instructions (Signed)
Visit Information  Ms. Deiss was given information about Medicaid Managed Care team care coordination services as a part of their [place] Medicaid benefit. [name] verbally consented to engagement with the [facility] Managed Care team.   If you are experiencing a medical emergency, please call 911 or report to your local emergency department or urgent care.   If you have a non-emergency medical problem during routine business hours, please contact your provider's office and ask to speak with a nurse.   For questions related to your [facility], please call: [phone] or visit the homepage here: [email or web address]  If you would like to schedule transportation through your [facility], please call the following number at least 2 days in advance of your appointment: [phone]   Rides for urgent appointments can also be made after hours by calling Member Services.  Call the [place] at [phone], at any time, 24 hours a day, 7 days a week. If you are in danger or need immediate medical attention call 911.  If you would like help to quit smoking, call 1-800-QUIT-NOW (251)139-5454) OR Espaol: 1-855-Djelo-Ya (0-349-179-1505) o para ms informacin haga clic aqu or Text READY to 200-400 to register via text  Ms. Rutt,   Please see education materials related to HF provided by MyChart link.  Patient verbalizes understanding of instructions and care plan provided today and agrees to view in [place]. Active MyChart status and patient understanding of how to access instructions and care plan via MyChart confirmed with patient.     Telephone follow up appointment with Managed Medicaid care management team member scheduled for:02/08/23 @ 10:30am  [name] RN, BSN [place] RN Care  Coordinator   Following is a copy of your plan of care:  Care Plan : RN Care Manager Plan of Care  Updates made by Melissa Montane, RN since 12/24/2022 12:00 AM     Problem: Health Mangement needs related to CHF      Long-Range Goal: Development of Plan of Care to address Health Mangement needs related to CHF   Start Date: 07/16/2022  Expected End Date: 02/17/2023  Priority: High  Note:   Current Barriers:  Chronic Disease Management support and education needs related to CHF-Ms. Walthers is scheduled for outpatient surgery for hysterectomy on 02/02/23. She has transportation and has someone to be with her following the surgery. She denies any needs today.  RNCM Clinical Goal(s):  Patient will verbalize understanding of plan for management of CHF as evidenced by Patient reports take all medications exactly as prescribed and will call provider for medication related questions as evidenced by patient reports and EMR documentation    attend all scheduled medical appointments: 12/31/22 with PCP and Oncology, 01/27/22 Dr. Berline Lopes and Pre Op, 02/02/23 for Outpatient Surgery  as evidenced by provider documentation           Interventions: Inter-disciplinary care team collaboration (see longitudinal plan of care) Evaluation of current treatment plan related to  self management and patient's adherence to plan as established by provider Review of upcoming appointments including:12/31/22 with PCP and Oncology, 01/27/23 with Dr. Berline Lopes and Pre Op, 02/02/23 for Outpatient surgery    Heart Failure Interventions:  (Status: Goal on Track (progressing): YES.)  Long Term Goal  Wt Readings from Last 3 Encounters:  12/06/22 237 lb (107.5 kg)  12/06/22 237 lb 11.2 oz (107.8 kg)  10/19/22 240 lb 12.8  oz (109.2 kg)   Provided education on low sodium diet Discussed importance of daily weight and advised patient to weigh and record daily Reviewed role of diuretics in prevention of fluid overload and management of heart  failure Discussed the importance of keeping all appointments with provider Reviewed medications and advised patient to request refills prior to running out of medication Advised patient to take all medications with her to PreOp appointments SDOH assessment   Patient Goals/Self-Care Activities: Take medications as prescribed   Attend all scheduled provider appointments Call provider office for new concerns or questions  Work with the social worker to address care coordination needs and will continue to work with the clinical team to address health care and disease management related needs call office if I gain more than 2 pounds in one day or 5 pounds in one week track weight in diary use salt in moderation weigh myself daily follow rescue plan if symptoms flare-up

## 2022-12-28 ENCOUNTER — Other Ambulatory Visit (HOSPITAL_COMMUNITY): Payer: Self-pay

## 2022-12-28 ENCOUNTER — Other Ambulatory Visit: Payer: Self-pay | Admitting: Hematology and Oncology

## 2022-12-28 DIAGNOSIS — M170 Bilateral primary osteoarthritis of knee: Secondary | ICD-10-CM | POA: Diagnosis not present

## 2022-12-28 DIAGNOSIS — C50412 Malignant neoplasm of upper-outer quadrant of left female breast: Secondary | ICD-10-CM

## 2022-12-28 MED ORDER — METHADONE HCL 5 MG PO TABS
15.0000 mg | ORAL_TABLET | Freq: Three times a day (TID) | ORAL | 0 refills | Status: DC | PRN
  Filled 2022-12-28 – 2022-12-30 (×3): qty 270, 30d supply, fill #0

## 2022-12-29 ENCOUNTER — Other Ambulatory Visit (HOSPITAL_COMMUNITY): Payer: Self-pay

## 2022-12-29 ENCOUNTER — Telehealth: Payer: Self-pay

## 2022-12-29 DIAGNOSIS — M170 Bilateral primary osteoarthritis of knee: Secondary | ICD-10-CM | POA: Diagnosis not present

## 2022-12-29 NOTE — Telephone Encounter (Signed)
Pt called and requested to r/s appt she has w/MD 1/12 d/t PCP visit. Pt was r/s to see Wilber Bihari, NP 01/04/23 at 0945. She knows she must come to this appt as we just refilled her Methadone and will not fill it next month if she does not come in 1/16. She verbalized understanding and knows to call with any further questions.

## 2022-12-30 ENCOUNTER — Other Ambulatory Visit: Payer: Medicaid Other

## 2022-12-30 ENCOUNTER — Other Ambulatory Visit (HOSPITAL_COMMUNITY): Payer: Self-pay

## 2022-12-30 DIAGNOSIS — M170 Bilateral primary osteoarthritis of knee: Secondary | ICD-10-CM | POA: Diagnosis not present

## 2022-12-30 NOTE — Patient Instructions (Signed)
  Medicaid Managed Care   Unsuccessful Outreach Note  12/30/2022 Name: [name] MRN: [id number] DOB: 10/30/1961  Referred by: Orvis Brill, DO Reason for referral : High Risk Managed Medicaid (MM Social Work [employer] )   An unsuccessful telephone outreach was attempted today. The patient was referred to the case management team for assistance with care management and care coordination.   Follow Up Plan: The care management team will reach out to the patient again over the next 30 days.   Mickel Fuchs, BSW, [place] Managed Medicaid Team  [phone]

## 2022-12-30 NOTE — Patient Outreach (Signed)
  Medicaid Managed Care   Unsuccessful Outreach Note  12/30/2022 Name: [name] MRN: [id number] DOB: 04-23-1961  Referred by: Orvis Brill, DO Reason for referral : High Risk Managed Medicaid (MM Social Work [employer] )   An unsuccessful telephone outreach was attempted today. The patient was referred to the case management team for assistance with care management and care coordination.   Follow Up Plan: The care management team will reach out to the patient again over the next 30 days.   Mickel Fuchs, BSW, [place] Managed Medicaid Team  [phone]

## 2022-12-31 ENCOUNTER — Inpatient Hospital Stay: Payer: Medicaid Other | Admitting: Hematology and Oncology

## 2022-12-31 ENCOUNTER — Ambulatory Visit: Payer: Medicaid Other | Admitting: Student

## 2022-12-31 DIAGNOSIS — M170 Bilateral primary osteoarthritis of knee: Secondary | ICD-10-CM | POA: Diagnosis not present

## 2023-01-01 DIAGNOSIS — M170 Bilateral primary osteoarthritis of knee: Secondary | ICD-10-CM | POA: Diagnosis not present

## 2023-01-02 DIAGNOSIS — M170 Bilateral primary osteoarthritis of knee: Secondary | ICD-10-CM | POA: Diagnosis not present

## 2023-01-03 DIAGNOSIS — M170 Bilateral primary osteoarthritis of knee: Secondary | ICD-10-CM | POA: Diagnosis not present

## 2023-01-04 ENCOUNTER — Encounter: Payer: Self-pay | Admitting: Nurse Practitioner

## 2023-01-04 ENCOUNTER — Inpatient Hospital Stay: Payer: Medicaid Other | Attending: Adult Health | Admitting: Adult Health

## 2023-01-04 ENCOUNTER — Inpatient Hospital Stay (HOSPITAL_BASED_OUTPATIENT_CLINIC_OR_DEPARTMENT_OTHER): Payer: Medicaid Other | Admitting: Nurse Practitioner

## 2023-01-04 ENCOUNTER — Encounter: Payer: Self-pay | Admitting: Adult Health

## 2023-01-04 VITALS — BP 153/103 | HR 83 | Temp 97.3°F | Resp 16 | Wt 234.0 lb

## 2023-01-04 VITALS — BP 153/103 | HR 83 | Temp 97.3°F | Resp 16 | Wt 234.8 lb

## 2023-01-04 DIAGNOSIS — Z515 Encounter for palliative care: Secondary | ICD-10-CM | POA: Diagnosis not present

## 2023-01-04 DIAGNOSIS — Z853 Personal history of malignant neoplasm of breast: Secondary | ICD-10-CM | POA: Diagnosis present

## 2023-01-04 DIAGNOSIS — G893 Neoplasm related pain (acute) (chronic): Secondary | ICD-10-CM

## 2023-01-04 DIAGNOSIS — C50412 Malignant neoplasm of upper-outer quadrant of left female breast: Secondary | ICD-10-CM

## 2023-01-04 DIAGNOSIS — F17210 Nicotine dependence, cigarettes, uncomplicated: Secondary | ICD-10-CM | POA: Diagnosis not present

## 2023-01-04 DIAGNOSIS — M170 Bilateral primary osteoarthritis of knee: Secondary | ICD-10-CM | POA: Diagnosis not present

## 2023-01-04 DIAGNOSIS — Z9221 Personal history of antineoplastic chemotherapy: Secondary | ICD-10-CM | POA: Insufficient documentation

## 2023-01-04 DIAGNOSIS — Z923 Personal history of irradiation: Secondary | ICD-10-CM | POA: Diagnosis not present

## 2023-01-04 DIAGNOSIS — K5900 Constipation, unspecified: Secondary | ICD-10-CM | POA: Diagnosis not present

## 2023-01-04 NOTE — Assessment & Plan Note (Addendum)
[name] is a 62 year old woman with history of stage IIIa triple positive breast cancer status post bilateral mastectomies followed by chemotherapy and maintenance Herceptin along with adjuvant radiation and tamoxifen which she took for 10 years beginning August 2013.  [name] has an upcoming surgery for precancerous cells noted in endometrial biopsy.  She also has a fibroid uterus which she we will have removed.  [name] is doing well today.  Her exam appears stable however we will get a left chest wall ultrasound to further evaluate this nodularity she is feeling.  Otherwise she has no clinical or radiographic signs of breast cancer recurrence and she will continue to see [name] for routine surveillance.  She has a history of chronic pain that she is taking methadone for.  She we will follow-up with palliative care today to further discuss this.  We will see [name] back in about 6 months for routine follow-up she knows to call for any questions or concerns that may arise between now and her next visit.
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
## 2023-01-05 ENCOUNTER — Telehealth: Payer: Self-pay | Admitting: Adult Health

## 2023-01-05 DIAGNOSIS — M170 Bilateral primary osteoarthritis of knee: Secondary | ICD-10-CM | POA: Diagnosis not present

## 2023-01-05 NOTE — Telephone Encounter (Signed)
Scheduled appointment per 1/16 los. Patient is aware.

## 2023-01-06 DIAGNOSIS — M170 Bilateral primary osteoarthritis of knee: Secondary | ICD-10-CM | POA: Diagnosis not present

## 2023-01-07 ENCOUNTER — Ambulatory Visit: Payer: Medicaid Other | Admitting: Family Medicine

## 2023-01-07 DIAGNOSIS — M170 Bilateral primary osteoarthritis of knee: Secondary | ICD-10-CM | POA: Diagnosis not present

## 2023-01-08 DIAGNOSIS — M170 Bilateral primary osteoarthritis of knee: Secondary | ICD-10-CM | POA: Diagnosis not present

## 2023-01-09 DIAGNOSIS — M170 Bilateral primary osteoarthritis of knee: Secondary | ICD-10-CM | POA: Diagnosis not present

## 2023-01-10 DIAGNOSIS — M170 Bilateral primary osteoarthritis of knee: Secondary | ICD-10-CM | POA: Diagnosis not present

## 2023-01-10 NOTE — Progress Notes (Deleted)
    SUBJECTIVE:   CHIEF COMPLAINT / HPI:  No chief complaint on file.   ***  PERTINENT  PMH / PSH: Breast cancer s/p bilateral mastectomy followed by chemotherapy and antiestrogen therapy with adjuvant radiation, CHF, CAD, morbid obesity, HTN, HLD, lymphedema, prediabetes, tobacco abuse  Patient Care Team: Orvis Brill, DO as PCP - General (Family Medicine) Belva Crome, MD as PCP - Cardiology (Cardiology) Lavonia Drafts, MD as Consulting Physician (Obstetrics and Gynecology) Newt Minion, MD as Consulting Physician (Orthopedic Surgery) Maczis, Carlena Hurl, PA-C as Physician Assistant (General Surgery) Edrick Kins, DPM as Consulting Physician (Podiatry) Melissa Montane, RN as Case Manager Ethelda Chick as Social Worker   OBJECTIVE:   LMP 08/07/2011   Physical Exam      12/06/2022    2:53 PM  Depression screen PHQ 2/9  Decreased Interest 0  Down, Depressed, Hopeless 0  PHQ - 2 Score 0  Altered sleeping 1  Tired, decreased energy 1  Change in appetite 0  Feeling bad or failure about yourself  0  Trouble concentrating 0  Moving slowly or fidgety/restless 0  Suicidal thoughts 0  PHQ-9 Score 2  Difficult doing work/chores Not difficult at all     {Show previous vital signs (optional):23777}  {Labs  Heme  Chem  Endocrine  Serology  Results Review (optional):23779}  ASSESSMENT/PLAN:   No problem-specific Assessment & Plan notes found for this encounter.    No follow-ups on file.   Zola Button, MD [place]

## 2023-01-10 NOTE — Patient Instructions (Incomplete)
It was nice seeing you today!  Blood work today.  See me in 3 months or whenever is a good for you.  Stay well, Hosteen Kienast, MD [facility] [phone]  --  Make sure to check out at the front desk before you leave today.  Please arrive at least 15 minutes prior to your scheduled appointments.  If you had blood work today, I will send you a MyChart message or a letter if results are normal. Otherwise, I will give you a call.  If you had a referral placed, they will call you to set up an appointment. Please give us a call if you don't hear back in the next 2 weeks.  If you need additional refills before your next appointment, please call your pharmacy first.  

## 2023-01-11 ENCOUNTER — Ambulatory Visit: Payer: Medicaid Other | Admitting: Family Medicine

## 2023-01-11 DIAGNOSIS — M170 Bilateral primary osteoarthritis of knee: Secondary | ICD-10-CM | POA: Diagnosis not present

## 2023-01-13 DIAGNOSIS — M170 Bilateral primary osteoarthritis of knee: Secondary | ICD-10-CM | POA: Diagnosis not present

## 2023-01-14 ENCOUNTER — Ambulatory Visit: Payer: Medicaid Other | Admitting: Student

## 2023-01-14 DIAGNOSIS — M170 Bilateral primary osteoarthritis of knee: Secondary | ICD-10-CM | POA: Diagnosis not present

## 2023-01-15 DIAGNOSIS — M170 Bilateral primary osteoarthritis of knee: Secondary | ICD-10-CM | POA: Diagnosis not present

## 2023-01-16 DIAGNOSIS — M170 Bilateral primary osteoarthritis of knee: Secondary | ICD-10-CM | POA: Diagnosis not present

## 2023-01-17 DIAGNOSIS — M170 Bilateral primary osteoarthritis of knee: Secondary | ICD-10-CM | POA: Diagnosis not present

## 2023-01-18 DIAGNOSIS — M170 Bilateral primary osteoarthritis of knee: Secondary | ICD-10-CM | POA: Diagnosis not present

## 2023-01-18 NOTE — Progress Notes (Deleted)
  SUBJECTIVE:   CHIEF COMPLAINT / HPI:   ***  PERTINENT  PMH / PSH: HTN, HLD, CAD, history of MI, OSA, morbid obesity, breast cancer, CHF, methadone dependence  OBJECTIVE:  LMP 08/07/2011  Physical Exam   ASSESSMENT/PLAN:  There are no diagnoses linked to this encounter. No follow-ups on file. Wells Guiles, DO 01/18/2023, 2:24 PM PGY-***, [facility] {    This will disappear when note is signed, click to select method of visit    :1}

## 2023-01-19 ENCOUNTER — Ambulatory Visit: Payer: Medicaid Other | Admitting: Student

## 2023-01-19 DIAGNOSIS — M170 Bilateral primary osteoarthritis of knee: Secondary | ICD-10-CM | POA: Diagnosis not present

## 2023-01-20 ENCOUNTER — Telehealth: Payer: Self-pay | Admitting: Student

## 2023-01-20 DIAGNOSIS — M170 Bilateral primary osteoarthritis of knee: Secondary | ICD-10-CM | POA: Diagnosis not present

## 2023-01-20 NOTE — Telephone Encounter (Signed)
[employer] called checking on the status of paperwork that was sent in for patient. It was sent 01/11/23. Patient has lost personal care services and needs the paperwork as soon as possible please.

## 2023-01-21 DIAGNOSIS — M170 Bilateral primary osteoarthritis of knee: Secondary | ICD-10-CM | POA: Diagnosis not present

## 2023-01-22 DIAGNOSIS — M170 Bilateral primary osteoarthritis of knee: Secondary | ICD-10-CM | POA: Diagnosis not present

## 2023-01-23 DIAGNOSIS — M170 Bilateral primary osteoarthritis of knee: Secondary | ICD-10-CM | POA: Diagnosis not present

## 2023-01-24 DIAGNOSIS — M170 Bilateral primary osteoarthritis of knee: Secondary | ICD-10-CM | POA: Diagnosis not present

## 2023-01-25 DIAGNOSIS — M170 Bilateral primary osteoarthritis of knee: Secondary | ICD-10-CM | POA: Diagnosis not present

## 2023-01-26 ENCOUNTER — Inpatient Hospital Stay: Payer: Medicaid Other | Attending: Adult Health | Admitting: Nurse Practitioner

## 2023-01-26 DIAGNOSIS — M170 Bilateral primary osteoarthritis of knee: Secondary | ICD-10-CM | POA: Diagnosis not present

## 2023-01-26 NOTE — Patient Instructions (Addendum)
DUE TO COVID-19 ONLY TWO VISITORS  (aged 60 and older)  ARE ALLOWED TO COME WITH YOU AND STAY IN THE WAITING ROOM ONLY DURING PRE OP AND PROCEDURE.   **NO VISITORS ARE ALLOWED IN THE SHORT STAY AREA OR RECOVERY ROOM!!**  IF YOU WILL BE ADMITTED INTO THE HOSPITAL YOU ARE ALLOWED ONLY FOUR SUPPORT PEOPLE DURING VISITATION HOURS ONLY (7 AM -8PM)   The support person(s) must pass our screening, gel in and out, and wear a mask at all times, including in the patient's room. Patients must also wear a mask when staff or their support person are in the room. Visitors GUEST BADGE MUST BE WORN VISIBLY  One adult visitor may remain with you overnight and MUST be in the room by 8 P.M.     Your procedure is scheduled on: 02/02/23   Report to [facility] Main Entrance    Report to admitting at   6:25 AM   Call this number if you have problems the morning of surgery [phone]  No Solid food after midnight on 2/13 start the full liquid diet until midnight on the 14th.  Light Diet- Full liquid diet  Strained creamy soups Tea, Coffee- with cream or mild and sugar or honey  Juices- cranberry , grape and apple  Jello  Milkshakes  Pudding , custards  Popsicles  Water Plain ice cream f, frozen yogurt, sherbet, plain yogurt  Fruit ices and popsicles with no fruit pulp  Sugar, honey and syrups Clear broths  Boost, Ensure, Resource and other liquid supplements NO CARBONATED BEVERAGES    After Midnight. Change to a clear liquid diet until 5:50 AM the morning of surgery   Water Black Coffee (sugar ok, NO MILK/CREAM OR CREAMERS)  Tea (sugar ok, NO MILK/CREAM OR CREAMERS) regular and decaf                             Plain Jell-O (NO RED)                                           Fruit ices (not with fruit pulp, NO RED)                                     Popsicles (NO RED)                                                                  Juice: apple, WHITE grape, WHITE cranberry Sports  drinks like Gatorade (NO RED)                   If you have questions, please contact your surgeon's office.       Oral Hygiene is also important to reduce your risk of infection.                                    Remember - BRUSH YOUR TEETH THE MORNING OF SURGERY WITH YOUR REGULAR  TOOTHPASTE  DENTURES WILL BE REMOVED PRIOR TO SURGERY PLEASE DO NOT APPLY "Poly grip" OR ADHESIVES!!!   Do NOT smoke after Midnight   Take these medicines the morning of surgery with A SIP OF WATER: Methadone                                                                                                                           Atorvastatin                                                                                                                           Zetia                                                                                                                           Carvedilol                                                                                                                           Allopurinol  DO NOT TAKE ANY ORAL DIABETIC MEDICATIONS DAY OF YOUR SURGERY Hold the Jardiance for 3 days before surgery. Last dose will be 01/30/23            Hold the ASA for 7 days prior to day of surgery     Last dose will be 01/25/23  You may not have any metal on your body including hair pins, jewelry, and body piercing             Do not wear make-up, lotions, powders, perfumes, or deodorant  Do not wear nail polish including gel and S&S, artificial/acrylic nails, or any other type of covering on natural nails including finger and toenails. If you have artificial nails, gel coating, etc. that needs to be removed by a nail salon please have this removed prior to surgery or surgery may need to be canceled/ delayed if the surgeon/ anesthesia feels like they are unable to be safely monitored.   Do not shave  48 hours prior to surgery.                  Do not bring valuables to  the hospital. [place].   Contacts, glasses, or bridgework may not be worn into surgery.   Bring small overnight bag day of surgery.   DO NOT [place]. PHARMACY WILL DISPENSE MEDICATIONS LISTED ON YOUR MEDICATION LIST TO YOU DURING YOUR ADMISSION [place]!    Patients discharged on the day of surgery will not be allowed to drive home.  Someone NEEDS to stay with you for the first 24 hours after anesthesia.   Special Instructions: Bring a copy of your healthcare power of attorney and living will documents the day of surgery if you haven't scanned them before.              Please read over the following fact sheets you were given: IF YOU HAVE QUESTIONS ABOUT YOUR PRE-OP INSTRUCTIONS PLEASE CALL [phone]    [facility] - Preparing for Surgery Before surgery, you can play an important role.  Because skin is not sterile, your skin needs to be as free of germs as possible.  You can reduce the number of germs on your skin by washing with CHG (chlorahexidine gluconate) soap before surgery.  CHG is an antiseptic cleaner which kills germs and bonds with the skin to continue killing germs even after washing. Please DO NOT use if you have an allergy to CHG or antibacterial soaps.  If your skin becomes reddened/irritated stop using the CHG and inform your nurse when you arrive at Short Stay. Do not shave (including legs and underarms) for at least 48 hours prior to the first CHG shower.   Please follow these instructions carefully:  1.  Shower with CHG Soap the night before surgery and the  morning of Surgery.  2.  If you choose to wash your hair, wash your hair first as usual with your  normal  shampoo.  3.  After you shampoo, rinse your hair and body thoroughly to remove the  shampoo.                            4.  Use CHG as you would any other liquid soap.  You can apply chg directly  to the skin and wash                        Gently with a scrungie or clean washcloth.  5.  Apply the CHG Soap to your body ONLY FROM THE NECK DOWN.   Do not use on face/ open  Wound or open sores. Avoid contact with eyes, ears mouth and genitals (private parts).                       Wash face,  Genitals (private parts) with your normal soap.             6.  Wash thoroughly, paying special attention to the area where your surgery  will be performed.  7.  Thoroughly rinse your body with warm water from the neck down.  8.  DO NOT shower/wash with your normal soap after using and rinsing off  the CHG Soap.             9.  Pat yourself dry with a clean towel.            10.  Wear clean pajamas.            11.  Place clean sheets on your bed the night of your first shower and do not  sleep with pets. Day of Surgery : Do not apply any lotions/deodorants the morning of surgery.  Please wear clean clothes to the hospital/surgery center.  FAILURE TO FOLLOW THESE INSTRUCTIONS MAY RESULT IN THE CANCELLATION OF YOUR SURGERY  ________________________________________________________________________
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
## 2023-01-27 ENCOUNTER — Ambulatory Visit: Payer: Medicaid Other | Admitting: Student

## 2023-01-27 ENCOUNTER — Other Ambulatory Visit (HOSPITAL_COMMUNITY): Payer: Self-pay

## 2023-01-27 ENCOUNTER — Inpatient Hospital Stay: Payer: Medicaid Other | Admitting: Gynecologic Oncology

## 2023-01-27 ENCOUNTER — Encounter: Payer: Self-pay | Admitting: Nurse Practitioner

## 2023-01-27 ENCOUNTER — Inpatient Hospital Stay (HOSPITAL_BASED_OUTPATIENT_CLINIC_OR_DEPARTMENT_OTHER): Payer: Medicaid Other | Admitting: Nurse Practitioner

## 2023-01-27 ENCOUNTER — Other Ambulatory Visit: Payer: Self-pay | Admitting: Nurse Practitioner

## 2023-01-27 ENCOUNTER — Encounter (HOSPITAL_COMMUNITY)
Admission: RE | Admit: 2023-01-27 | Discharge: 2023-01-27 | Disposition: A | Discharge status: Home / Self Care | Payer: Medicaid Other | Source: Ambulatory Visit | Attending: Student | Admitting: Student

## 2023-01-27 DIAGNOSIS — G893 Neoplasm related pain (acute) (chronic): Secondary | ICD-10-CM | POA: Diagnosis not present

## 2023-01-27 DIAGNOSIS — M170 Bilateral primary osteoarthritis of knee: Secondary | ICD-10-CM | POA: Diagnosis not present

## 2023-01-27 DIAGNOSIS — C50412 Malignant neoplasm of upper-outer quadrant of left female breast: Secondary | ICD-10-CM

## 2023-01-27 DIAGNOSIS — Z515 Encounter for palliative care: Secondary | ICD-10-CM | POA: Diagnosis not present

## 2023-01-27 DIAGNOSIS — N8502 Endometrial intraepithelial neoplasia [EIN]: Secondary | ICD-10-CM

## 2023-01-27 MED ORDER — METHADONE HCL 5 MG PO TABS
15.0000 mg | ORAL_TABLET | Freq: Three times a day (TID) | ORAL | 0 refills | Status: DC | PRN
  Filled 2023-01-27: qty 270, 30d supply, fill #0

## 2023-01-27 NOTE — Progress Notes (Signed)
Patient missed the PAT visit today. Message left on her phone.

## 2023-01-27 NOTE — Progress Notes (Signed)
No show
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
## 2023-01-28 DIAGNOSIS — M170 Bilateral primary osteoarthritis of knee: Secondary | ICD-10-CM | POA: Diagnosis not present

## 2023-01-28 NOTE — Telephone Encounter (Signed)
Received returned call about this paperwork.   Please advise if you have received this and if it has been completed.   If you need an additional copy, we can call Jinny Blossom, Care Manager at [phone].  Talbot Grumbling, RN

## 2023-01-29 DIAGNOSIS — M170 Bilateral primary osteoarthritis of knee: Secondary | ICD-10-CM | POA: Diagnosis not present

## 2023-01-30 DIAGNOSIS — M170 Bilateral primary osteoarthritis of knee: Secondary | ICD-10-CM | POA: Diagnosis not present

## 2023-01-31 ENCOUNTER — Other Ambulatory Visit (HOSPITAL_COMMUNITY): Payer: Self-pay

## 2023-01-31 ENCOUNTER — Ambulatory Visit (INDEPENDENT_AMBULATORY_CARE_PROVIDER_SITE_OTHER): Payer: Medicaid Other | Admitting: Student

## 2023-01-31 ENCOUNTER — Telehealth: Payer: Self-pay

## 2023-01-31 ENCOUNTER — Other Ambulatory Visit: Payer: Self-pay

## 2023-01-31 ENCOUNTER — Other Ambulatory Visit (HOSPITAL_BASED_OUTPATIENT_CLINIC_OR_DEPARTMENT_OTHER): Payer: Self-pay

## 2023-01-31 ENCOUNTER — Other Ambulatory Visit: Payer: Medicaid Other

## 2023-01-31 ENCOUNTER — Telehealth: Payer: Self-pay | Admitting: Student

## 2023-01-31 ENCOUNTER — Encounter: Payer: Self-pay | Admitting: Student

## 2023-01-31 VITALS — BP 142/87 | HR 79 | Ht 65.0 in | Wt 238.0 lb

## 2023-01-31 DIAGNOSIS — G8929 Other chronic pain: Secondary | ICD-10-CM | POA: Diagnosis not present

## 2023-01-31 DIAGNOSIS — I10 Essential (primary) hypertension: Secondary | ICD-10-CM | POA: Diagnosis not present

## 2023-01-31 DIAGNOSIS — Z599 Problem related to housing and economic circumstances, unspecified: Secondary | ICD-10-CM

## 2023-01-31 DIAGNOSIS — Z853 Personal history of malignant neoplasm of breast: Secondary | ICD-10-CM

## 2023-01-31 DIAGNOSIS — M170 Bilateral primary osteoarthritis of knee: Secondary | ICD-10-CM | POA: Diagnosis not present

## 2023-01-31 DIAGNOSIS — N8502 Endometrial intraepithelial neoplasia [EIN]: Secondary | ICD-10-CM | POA: Diagnosis not present

## 2023-01-31 DIAGNOSIS — R600 Localized edema: Secondary | ICD-10-CM | POA: Diagnosis not present

## 2023-01-31 DIAGNOSIS — M792 Neuralgia and neuritis, unspecified: Secondary | ICD-10-CM | POA: Diagnosis not present

## 2023-01-31 DIAGNOSIS — I509 Heart failure, unspecified: Secondary | ICD-10-CM | POA: Diagnosis not present

## 2023-01-31 MED ORDER — ISOSORBIDE MONONITRATE ER 60 MG PO TB24
60.0000 mg | ORAL_TABLET | Freq: Every day | ORAL | 3 refills | Status: DC
  Filled 2023-01-31: qty 90, 90d supply, fill #0
  Filled 2023-04-22: qty 90, 90d supply, fill #1
  Filled 2023-10-07 – 2023-10-20 (×2): qty 90, 90d supply, fill #2
  Filled 2023-11-28: qty 90, 90d supply, fill #3

## 2023-01-31 MED ORDER — FLUTICASONE PROPIONATE 50 MCG/ACT NA SUSP
1.0000 | Freq: Every day | NASAL | 2 refills | Status: DC
  Filled 2023-01-31: qty 16, 30d supply, fill #0
  Filled 2023-04-20: qty 16, 30d supply, fill #1
  Filled 2023-06-07 – 2023-06-24 (×3): qty 16, 30d supply, fill #2

## 2023-01-31 MED ORDER — MEDROXYPROGESTERONE ACETATE 10 MG PO TABS
20.0000 mg | ORAL_TABLET | Freq: Every day | ORAL | 2 refills | Status: DC
  Filled 2023-01-31: qty 60, 30d supply, fill #0
  Filled 2023-03-24: qty 60, 30d supply, fill #1

## 2023-01-31 MED ORDER — POTASSIUM CHLORIDE CRYS ER 20 MEQ PO TBCR
20.0000 meq | EXTENDED_RELEASE_TABLET | Freq: Three times a day (TID) | ORAL | 0 refills | Status: DC
  Filled 2023-01-31: qty 270, 90d supply, fill #0

## 2023-01-31 MED ORDER — FUROSEMIDE 40 MG PO TABS
40.0000 mg | ORAL_TABLET | Freq: Every day | ORAL | 0 refills | Status: DC
  Filled 2023-01-31: qty 7, 7d supply, fill #0

## 2023-01-31 NOTE — Telephone Encounter (Signed)
Any update on this?
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
## 2023-01-31 NOTE — Telephone Encounter (Signed)
Seth Bake from [employer] called inquiring on the status on the paperwork for personal care services. She said if there are any questions please call her, [phone]. Also left her fax number, [phone]

## 2023-01-31 NOTE — Patient Instructions (Signed)
It was great seeing you today.  Please start taking Lasix 40 mg daily for 7 days.  Please come back in 1 week for follow-up   If you have any questions or concerns, please feel free to call the clinic.   Have a wonderful day,  Dr. Orvis Brill [facility] [phone]

## 2023-01-31 NOTE — Telephone Encounter (Signed)
I have received it. I will print last office visit and fax.   Thanks [employer]

## 2023-01-31 NOTE — Patient Outreach (Signed)
  Medicaid Managed Care   Unsuccessful Outreach Note  01/31/2023 Name: [name] MRN: [id number] DOB: [birth year]  Referred by: Orvis Brill, DO Reason for referral : High Risk Managed Medicaid (MM Social work telephone outreach )   An unsuccessful telephone outreach was attempted today. The patient was referred to the case management team for assistance with care management and care coordination.   Follow Up Plan: A HIPAA compliant phone message was left for the patient providing contact information and requesting a return call.  Mickel Fuchs, BSW, [place] Managed Medicaid Team  [phone]

## 2023-01-31 NOTE — Telephone Encounter (Signed)
It was completed and faxed.  Thanks brooke

## 2023-01-31 NOTE — Telephone Encounter (Signed)
Megan case Freight forwarder with [facility] calls nurse line in regards to personal care service form faxed over.   She reports the original form was faxed over back in January. I do not see any notes in chart. She reports the form was never received back.   Jinny Blossom will be faxing over another copy this morning.

## 2023-01-31 NOTE — Patient Instructions (Signed)
  Medicaid Managed Care   Unsuccessful Outreach Note  01/31/2023 Name: [name] MRN: [id number] DOB: 10-23-1961  Referred by: Orvis Brill, DO Reason for referral : High Risk Managed Medicaid (MM Social work telephone outreach )   An unsuccessful telephone outreach was attempted today. The patient was referred to the case management team for assistance with care management and care coordination.   Follow Up Plan: The patient has been provided with contact information for the care management team and has been advised to call with any health related questions or concerns.   Mickel Fuchs, BSW, [place] Managed Medicaid Team  [phone]

## 2023-02-01 ENCOUNTER — Telehealth: Payer: Self-pay | Admitting: *Deleted

## 2023-02-01 DIAGNOSIS — M170 Bilateral primary osteoarthritis of knee: Secondary | ICD-10-CM | POA: Diagnosis not present

## 2023-02-01 NOTE — Telephone Encounter (Signed)
Patient called and stated "Dr Berline Lopes is going to be so mad at me, I have to cancel my surgery for tomorrow. I have the flu. I need Dr Berline Lopes to either reschedule it or referral me to another doctor." Explained that the message would be given to Dr Berline Lopes

## 2023-02-02 ENCOUNTER — Encounter (HOSPITAL_COMMUNITY): Admission: RE | Payer: Self-pay | Source: Ambulatory Visit

## 2023-02-02 ENCOUNTER — Ambulatory Visit (HOSPITAL_COMMUNITY): Admission: RE | Admit: 2023-02-02 | Payer: Medicaid Other | Source: Ambulatory Visit | Admitting: Gynecologic Oncology

## 2023-02-02 DIAGNOSIS — N8502 Endometrial intraepithelial neoplasia [EIN]: Secondary | ICD-10-CM

## 2023-02-02 DIAGNOSIS — M170 Bilateral primary osteoarthritis of knee: Secondary | ICD-10-CM | POA: Diagnosis not present

## 2023-02-02 DIAGNOSIS — E119 Type 2 diabetes mellitus without complications: Secondary | ICD-10-CM

## 2023-02-02 SURGERY — HYSTERECTOMY, TOTAL, ROBOT-ASSISTED, LAPAROSCOPIC, WITH BILATERAL SALPINGO-OOPHORECTOMY
Anesthesia: General

## 2023-02-02 NOTE — Telephone Encounter (Signed)
Called Seth Bake from [facility]. LVM letting her know I re faxed the paperwork for pt yesterday. I asked Seth Bake to call our office  if she did not receive the paperwork. Will wait to hear back from her if she did not receive the paperwork. Ottis Stain, CMA

## 2023-02-02 NOTE — Assessment & Plan Note (Addendum)
BLLE edema for a few days. SpO2 98% ORA, no crackles on lung exam. Not taking diuretics currently. Last echo in 2023 with EF 55-60%. Will start Lasix 40 mg daily x 1 week to help diurese fluid off to hopefully prevent worsening requiring hospitalization (last acute heart failure hospitalization 7 months ago) Encouraged to watch sodium intake, ambulate frequently ED return precautions discussed should she develop SOB or worsening edema Return in 1 week for f/u

## 2023-02-02 NOTE — Telephone Encounter (Signed)
02/01/2023 Called Seth Bake and refaxed the paperwork to her direct fax number.   Ottis Stain, CMA

## 2023-02-02 NOTE — Assessment & Plan Note (Signed)
BP elevated today, not at goal Has not taken medicines Refilled meds today

## 2023-02-03 DIAGNOSIS — M170 Bilateral primary osteoarthritis of knee: Secondary | ICD-10-CM | POA: Diagnosis not present

## 2023-02-03 NOTE — Telephone Encounter (Signed)
I called the patient to discuss steps moving forward.  No answer.  Left message requesting callback.

## 2023-02-04 DIAGNOSIS — M170 Bilateral primary osteoarthritis of knee: Secondary | ICD-10-CM | POA: Diagnosis not present

## 2023-02-05 DIAGNOSIS — M170 Bilateral primary osteoarthritis of knee: Secondary | ICD-10-CM | POA: Diagnosis not present

## 2023-02-06 DIAGNOSIS — M170 Bilateral primary osteoarthritis of knee: Secondary | ICD-10-CM | POA: Diagnosis not present

## 2023-02-07 DIAGNOSIS — I5020 Unspecified systolic (congestive) heart failure: Secondary | ICD-10-CM | POA: Diagnosis not present

## 2023-02-08 ENCOUNTER — Other Ambulatory Visit: Payer: Medicaid Other | Admitting: *Deleted

## 2023-02-08 DIAGNOSIS — I5020 Unspecified systolic (congestive) heart failure: Secondary | ICD-10-CM | POA: Diagnosis not present

## 2023-02-08 NOTE — Patient Instructions (Signed)
Visit Information  [name]  - as a part of your Medicaid benefit, you are eligible for care management and care coordination services at no cost or copay. I was unable to reach you by phone today but would be happy to help you with your health related needs. Please feel free to call me @ [phone].   A member of the Managed Medicaid care management team will reach out to you again over the next 7 days.   [name] RN, BSN [place]  [employer] manager

## 2023-02-08 NOTE — Patient Outreach (Signed)
  Medicaid Managed Care   Unsuccessful Attempt Note   02/08/2023 Name: SHANTEA LUDOVICO MRN: [id number] DOB: Aug 17, 1961  Referred by: Orvis Brill, DO Reason for referral : High Risk Managed Medicaid (Unsuccessful RNCM follow up telephone outreach)   A second unsuccessful telephone outreach was attempted today. The patient was referred to the case management team for assistance with care management and care coordination.    Follow Up Plan: A HIPAA compliant phone message was left for the patient providing contact information and requesting a return call. and The Managed Medicaid care management team will reach out to the patient again over the next 7 days.    [name] RN, BSN [place]  [employer] manager

## 2023-02-09 DIAGNOSIS — I5020 Unspecified systolic (congestive) heart failure: Secondary | ICD-10-CM | POA: Diagnosis not present

## 2023-02-10 DIAGNOSIS — I5020 Unspecified systolic (congestive) heart failure: Secondary | ICD-10-CM | POA: Diagnosis not present

## 2023-02-11 DIAGNOSIS — I5020 Unspecified systolic (congestive) heart failure: Secondary | ICD-10-CM | POA: Diagnosis not present

## 2023-02-12 DIAGNOSIS — I5020 Unspecified systolic (congestive) heart failure: Secondary | ICD-10-CM | POA: Diagnosis not present

## 2023-02-13 DIAGNOSIS — I5020 Unspecified systolic (congestive) heart failure: Secondary | ICD-10-CM | POA: Diagnosis not present

## 2023-02-14 ENCOUNTER — Encounter (HOSPITAL_COMMUNITY): Payer: Self-pay

## 2023-02-14 ENCOUNTER — Emergency Department (HOSPITAL_COMMUNITY): Payer: Medicaid Other

## 2023-02-14 ENCOUNTER — Emergency Department (HOSPITAL_COMMUNITY)
Admission: EM | Admit: 2023-02-14 | Discharge: 2023-02-14 | Disposition: A | Discharge status: Home / Self Care | Payer: Medicaid Other | Attending: Emergency Medicine | Admitting: Emergency Medicine

## 2023-02-14 ENCOUNTER — Other Ambulatory Visit (HOSPITAL_COMMUNITY): Payer: Self-pay

## 2023-02-14 ENCOUNTER — Telehealth: Payer: Self-pay

## 2023-02-14 ENCOUNTER — Other Ambulatory Visit: Payer: Self-pay

## 2023-02-14 DIAGNOSIS — R079 Chest pain, unspecified: Secondary | ICD-10-CM | POA: Diagnosis not present

## 2023-02-14 DIAGNOSIS — Z1152 Encounter for screening for COVID-19: Secondary | ICD-10-CM | POA: Diagnosis not present

## 2023-02-14 DIAGNOSIS — Z79899 Other long term (current) drug therapy: Secondary | ICD-10-CM | POA: Diagnosis not present

## 2023-02-14 DIAGNOSIS — I10 Essential (primary) hypertension: Secondary | ICD-10-CM | POA: Diagnosis not present

## 2023-02-14 DIAGNOSIS — J449 Chronic obstructive pulmonary disease, unspecified: Secondary | ICD-10-CM | POA: Diagnosis not present

## 2023-02-14 DIAGNOSIS — R519 Headache, unspecified: Secondary | ICD-10-CM | POA: Insufficient documentation

## 2023-02-14 DIAGNOSIS — I509 Heart failure, unspecified: Secondary | ICD-10-CM | POA: Insufficient documentation

## 2023-02-14 DIAGNOSIS — I2510 Atherosclerotic heart disease of native coronary artery without angina pectoris: Secondary | ICD-10-CM | POA: Diagnosis not present

## 2023-02-14 DIAGNOSIS — R0602 Shortness of breath: Secondary | ICD-10-CM | POA: Diagnosis not present

## 2023-02-14 DIAGNOSIS — B9789 Other viral agents as the cause of diseases classified elsewhere: Secondary | ICD-10-CM | POA: Diagnosis not present

## 2023-02-14 DIAGNOSIS — I5020 Unspecified systolic (congestive) heart failure: Secondary | ICD-10-CM | POA: Diagnosis not present

## 2023-02-14 DIAGNOSIS — I130 Hypertensive heart and chronic kidney disease with heart failure and stage 1 through stage 4 chronic kidney disease, or unspecified chronic kidney disease: Secondary | ICD-10-CM | POA: Insufficient documentation

## 2023-02-14 DIAGNOSIS — J069 Acute upper respiratory infection, unspecified: Secondary | ICD-10-CM | POA: Diagnosis not present

## 2023-02-14 DIAGNOSIS — Z853 Personal history of malignant neoplasm of breast: Secondary | ICD-10-CM | POA: Insufficient documentation

## 2023-02-14 DIAGNOSIS — J90 Pleural effusion, not elsewhere classified: Secondary | ICD-10-CM | POA: Diagnosis not present

## 2023-02-14 DIAGNOSIS — I110 Hypertensive heart disease with heart failure: Secondary | ICD-10-CM | POA: Diagnosis not present

## 2023-02-14 DIAGNOSIS — N189 Chronic kidney disease, unspecified: Secondary | ICD-10-CM | POA: Diagnosis not present

## 2023-02-14 DIAGNOSIS — Z7982 Long term (current) use of aspirin: Secondary | ICD-10-CM | POA: Insufficient documentation

## 2023-02-14 DIAGNOSIS — R0789 Other chest pain: Secondary | ICD-10-CM | POA: Diagnosis not present

## 2023-02-14 LAB — CBC
HCT: 49.2 % — ABNORMAL HIGH (ref 36.0–46.0)
Hemoglobin: 15.9 g/dL — ABNORMAL HIGH (ref 12.0–15.0)
MCH: 28 pg (ref 26.0–34.0)
MCHC: 32.3 g/dL (ref 30.0–36.0)
MCV: 86.8 fL (ref 80.0–100.0)
Platelets: 453 10*3/uL — ABNORMAL HIGH (ref 150–400)
RBC: 5.67 MIL/uL — ABNORMAL HIGH (ref 3.87–5.11)
RDW: 17 % — ABNORMAL HIGH (ref 11.5–15.5)
WBC: 10 10*3/uL (ref 4.0–10.5)
nRBC: 0 % (ref 0.0–0.2)

## 2023-02-14 LAB — TROPONIN I (HIGH SENSITIVITY)
Troponin I (High Sensitivity): 11 ng/L (ref <18)
Troponin I (High Sensitivity): 12 ng/L (ref <18)

## 2023-02-14 LAB — RESP PANEL BY RT-PCR (RSV, FLU A&B, COVID)  RVPGX2
Influenza A by PCR: NEGATIVE
Influenza B by PCR: NEGATIVE
Resp Syncytial Virus by PCR: NEGATIVE
SARS Coronavirus 2 by RT PCR: NEGATIVE

## 2023-02-14 LAB — BASIC METABOLIC PANEL
Anion gap: 9 (ref 5–15)
BUN: 8 mg/dL (ref 6–20)
CO2: 21 mmol/L — ABNORMAL LOW (ref 22–32)
Calcium: 8.4 mg/dL — ABNORMAL LOW (ref 8.9–10.3)
Chloride: 108 mmol/L (ref 98–111)
Creatinine, Ser: 0.87 mg/dL (ref 0.44–1.00)
GFR, Estimated: >60 mL/min (ref >60)
Glucose, Bld: 117 mg/dL — ABNORMAL HIGH (ref 70–99)
Potassium: 3.4 mmol/L — ABNORMAL LOW (ref 3.5–5.1)
Sodium: 138 mmol/L (ref 135–145)

## 2023-02-14 LAB — BRAIN NATRIURETIC PEPTIDE: B Natriuretic Peptide: 613.4 pg/mL — ABNORMAL HIGH (ref 0.0–100.0)

## 2023-02-14 MED ORDER — POTASSIUM CHLORIDE CRYS ER 20 MEQ PO TBCR
40.0000 meq | EXTENDED_RELEASE_TABLET | Freq: Once | ORAL | Status: AC
  Administered 2023-02-14: 40 meq via ORAL
  Filled 2023-02-14: qty 2

## 2023-02-14 MED ORDER — FUROSEMIDE 10 MG/ML IJ SOLN
40.0000 mg | Freq: Once | INTRAMUSCULAR | Status: AC
  Administered 2023-02-14: 40 mg via INTRAVENOUS
  Filled 2023-02-14: qty 4

## 2023-02-14 MED ORDER — IPRATROPIUM-ALBUTEROL 0.5-2.5 (3) MG/3ML IN SOLN
3.0000 mL | Freq: Once | RESPIRATORY_TRACT | Status: AC
  Administered 2023-02-14: 3 mL via RESPIRATORY_TRACT
  Filled 2023-02-14: qty 3

## 2023-02-14 MED ORDER — FUROSEMIDE 20 MG PO TABS
40.0000 mg | ORAL_TABLET | Freq: Every day | ORAL | 0 refills | Status: DC
  Filled 2023-02-14 – 2023-04-22 (×3): qty 60, 30d supply, fill #0

## 2023-02-14 MED ORDER — ACETAMINOPHEN 325 MG PO TABS
650.0000 mg | ORAL_TABLET | Freq: Once | ORAL | Status: AC
  Administered 2023-02-14: 650 mg via ORAL
  Filled 2023-02-14: qty 2

## 2023-02-14 NOTE — ED Provider Notes (Signed)
[facility] Provider Note   CSN: [id number] Arrival date & time: 02/14/23  1005     History  Chief Complaint  Patient presents with   Shortness of Breath    [name] is a 62 y.o. female.   Shortness of Breath    Patient has a history of hypertension, coronary artery disease, breast cancer, chronic kidney disease, CHF.  Patient also is a chronic smoker.  Patient presents to the ED with complaints of headache cough chest discomfort.  Patient states she started having symptoms last night.  She has been coughing up yellow mucus.  She has felt pressure pain and discomfort in her chest.  Patient also started to feel short of breath.  She has not measured any fevers or chills.  She is having a headache on the left side of her head.  She denies any numbness or weakness.  No recent falls or injury.  No fevers or chills.  Patient states she did run out of her Lasix a few days ago  Home Medications Prior to Admission medications   Medication Sig Start Date End Date Taking? Authorizing Provider  acetaminophen (TYLENOL) 325 MG tablet Take 2 tablets (650 mg total) by mouth every 6 (six) hours as needed for mild pain, moderate pain or headache. 07/09/22   Ezequiel Essex, MD  allopurinol (ZYLOPRIM) 300 MG tablet Take 1 tablet (300 mg total) by mouth daily. 12/06/22   Dameron, Luna Fuse, DO  ascorbic acid (VITAMIN C) 500 MG tablet Take 500 mg by mouth daily.    [provider]  aspirin EC 81 MG tablet Take 81 mg by mouth daily.    [provider]  atorvastatin (LIPITOR) 40 MG tablet TAKE 1 TABLET BY MOUTH DAILY AT 6 PM. 12/06/22   Dameron, Luna Fuse, DO  carvedilol (COREG) 25 MG tablet Take 1 tablet (25 mg total) by mouth 2 (two) times daily with a meal. 12/06/22   Dameron, Luna Fuse, DO  cholecalciferol (VITAMIN D3) 25 MCG (1000 UNIT) tablet Take 1,000 Units by mouth daily.    [provider]  empagliflozin (JARDIANCE) 10 MG TABS  tablet Take 1 tablet (10 mg total) by mouth daily. 12/06/22   Dameron, Luna Fuse, DO  ezetimibe (ZETIA) 10 MG tablet Take 1 tablet (10 mg total) by mouth daily. 12/06/22   Dameron, Luna Fuse, DO  fluticasone (FLONASE) 50 MCG/ACT nasal spray Place 1 spray into both nostrils daily. 01/31/23   Dameron, Luna Fuse, DO  furosemide (LASIX) 20 MG tablet Take 2 tablets (40 mg total) by mouth daily. TAKE AN EXTRA 20 MG IF WEIGHT GAIN 3 LBS IN 24 HOURS OR 5 LBS IN A WEEK 02/14/23 03/16/23  Dorie Rank, MD  gabapentin (NEURONTIN) 100 MG capsule Take 1 capsule (100 mg total) by mouth at bedtime. 12/06/22   Dameron, Luna Fuse, DO  isosorbide mononitrate (IMDUR) 60 MG 24 hr tablet Take 1 tablet (60 mg total) by mouth at bedtime. 01/31/23   Dameron, Luna Fuse, DO  medroxyPROGESTERone (PROVERA) 10 MG tablet Take 2 tablets (20 mg total) by mouth daily. 01/31/23   Dameron, Luna Fuse, DO  methadone (DOLOPHINE) 5 MG tablet Take 3 tablets (15 mg total) by mouth every 8 (eight) hours as needed. 01/27/23 07/26/23  Pickenpack-Cousar, Carlena Sax, NP  nitroGLYCERIN (NITROSTAT) 0.4 MG SL tablet Place 1 tablet (0.4 mg total) under the tongue every 5 (five) minutes as needed for chest pain. 06/09/22   [name], MD  potassium chloride SA (KLOR-CON M20) 20 MEQ  tablet Take 1 tablet (20 mEq total) by mouth 3 (three) times daily. 01/31/23   Dameron, Luna Fuse, DO  famotidine (PEPCID) 20 MG tablet Take 20 mg by mouth 2 (two) times daily.    03/10/12  [provider]      Allergies    Patient has no known allergies.    Review of Systems   Review of Systems  Respiratory:  Positive for shortness of breath.     Physical Exam Updated Vital Signs BP (!) 181/77   Pulse 66   Temp 98.1 F (36.7 C) (Oral)   Resp (!) 24   Ht 1.651 m ('5\' 5"'$ )   Wt 108.9 kg   LMP 08/07/2011   SpO2 91%   BMI 39.94 kg/m  Physical Exam Vitals and nursing note reviewed.  Constitutional:      General: She is not in acute distress.    Appearance: She is well-developed.   HENT:     Head: Normocephalic and atraumatic.     Right Ear: External ear normal.     Left Ear: External ear normal.  Eyes:     General: No scleral icterus.       Right eye: No discharge.        Left eye: No discharge.     Conjunctiva/sclera: Conjunctivae normal.  Neck:     Trachea: No tracheal deviation.  Cardiovascular:     Rate and Rhythm: Normal rate and regular rhythm.  Pulmonary:     Effort: Pulmonary effort is normal. No respiratory distress.     Breath sounds: No stridor. Decreased breath sounds present. No wheezing, rhonchi or rales.  Abdominal:     General: Bowel sounds are normal. There is no distension.     Palpations: Abdomen is soft.     Tenderness: There is no abdominal tenderness. There is no guarding or rebound.  Musculoskeletal:        General: No tenderness or deformity.     Cervical back: Neck supple.  Skin:    General: Skin is warm and dry.     Findings: No rash.  Neurological:     General: No focal deficit present.     Mental Status: She is alert.     Cranial Nerves: No cranial nerve deficit, dysarthria or facial asymmetry.     Sensory: No sensory deficit.     Motor: No abnormal muscle tone or seizure activity.     Coordination: Coordination normal.  Psychiatric:        Mood and Affect: Mood normal.     ED Results / Procedures / Treatments   Labs (all labs ordered are listed, but only abnormal results are displayed) Labs Reviewed  BASIC METABOLIC PANEL - Abnormal; Notable for the following components:      Result Value   Potassium 3.4 (*)    CO2 21 (*)    Glucose, Bld 117 (*)    Calcium 8.4 (*)    All other components within normal limits  CBC - Abnormal; Notable for the following components:   RBC 5.67 (*)    Hemoglobin 15.9 (*)    HCT 49.2 (*)    RDW 17.0 (*)    Platelets 453 (*)    All other components within normal limits  BRAIN NATRIURETIC PEPTIDE - Abnormal; Notable for the following components:   B Natriuretic Peptide 613.4 (*)     All other components within normal limits  RESP PANEL BY RT-PCR (RSV, FLU A&B, COVID)  RVPGX2  TROPONIN I (  HIGH SENSITIVITY)  TROPONIN I (HIGH SENSITIVITY)    EKG EKG Interpretation  Date/Time:  Monday February 14 2023 10:13:43 EST Ventricular Rate:  71 PR Interval:  165 QRS Duration: 103 QT Interval:  449 QTC Calculation: 488 R Axis:   69 Text Interpretation: Sinus rhythm Probable anterior infarct, old Nonspecific T abnormalities, lateral leads more prominent now compared to prior ECG Confirmed by Dorie Rank [phone]) on 02/14/2023 10:15:21 AM  Radiology CT Head Wo Contrast  Result Date: 02/14/2023 CLINICAL DATA:  Headache. Shortness of breath and chest pain since last evening. EXAM: CT HEAD WITHOUT CONTRAST TECHNIQUE: Contiguous axial images were obtained from the base of the skull through the vertex without intravenous contrast. RADIATION DOSE REDUCTION: This exam was performed according to the departmental dose-optimization program which includes automated exposure control, adjustment of the mA and/or kV according to patient size and/or use of iterative reconstruction technique. COMPARISON:  Brain MRI 07/04/2018 FINDINGS: Brain: No evidence of acute infarction, hemorrhage, hydrocephalus, extra-axial collection or mass lesion/mass effect. Cavum septum pellucidum, normal variant. Vascular: No hyperdense vessel or unexpected calcification. Skull: Normal. Negative for fracture or focal lesion. Sinuses/Orbits: No acute abnormality. Mild mucosal thickening identified within the left maxillary sinus. Other: No free fluid or fluid collections. IMPRESSION: 1. No acute intracranial abnormalities. 2. Mild left maxillary sinus mucosal thickening. Electronically Signed   By: Kerby Moors M.D.   On: 02/14/2023 12:00   DG Chest 2 View  Result Date: 02/14/2023 CLINICAL DATA:  Shortness of breath, chest pain EXAM: CHEST - 2 VIEW COMPARISON:  07/04/2022 FINDINGS: Cardiomegaly. Diffuse bilateral  interstitial pulmonary opacity. Small bilateral pleural effusions. Disc degenerative disease of the thoracic spine. IMPRESSION: Cardiomegaly with diffuse bilateral interstitial pulmonary opacity and small bilateral pleural effusions, most consistent with edema. No focal airspace opacity. Electronically Signed   By: Delanna Ahmadi M.D.   On: 02/14/2023 10:55    Procedures Procedures    Medications Ordered in ED Medications  acetaminophen (TYLENOL) tablet 650 mg (650 mg Oral Given 02/14/23 1117)  ipratropium-albuterol (DUONEB) 0.5-2.5 (3) MG/3ML nebulizer solution 3 mL (3 mLs Nebulization Given 02/14/23 1119)  furosemide (LASIX) injection 40 mg (40 mg Intravenous Given 02/14/23 1238)  potassium chloride SA (KLOR-CON M) CR tablet 40 mEq (40 mEq Oral Given 02/14/23 1235)    ED Course/ Medical Decision Making/ A&P Clinical Course as of 02/14/23 1508  Mon Feb 14, 2023  1145 Chest x-ray suggestive of pulmonary edema [JK]  1145 COVID flu RSV negative [JK]  1418 Patient is diuresing.  She is still anxious about going home however.  Will try to check her oxygen saturation with ambulation.  May end up requiring admission for CHF exacerbation [JK]  1452 Brain natriuretic peptide(!) BNP elevated [JK]  1453 Patient was able to ambulate in the emergency room without oxygen requirement [JK]    Clinical Course User Index [JK] Dorie Rank, MD                             Medical Decision Making Problems Addressed: Acute on chronic congestive heart failure, unspecified heart failure type [facility]): chronic illness or injury with exacerbation, progression, or side effects of treatment that poses a threat to life or bodily functions Viral upper respiratory tract infection: acute illness or injury that poses a threat to life or bodily functions  Amount and/or Complexity of Data Reviewed Labs: ordered. Decision-making details documented in ED Course. Radiology: ordered and independent interpretation  performed.  Risk OTC drugs. Prescription drug management.   Patient presented to the ED for evaluation of cough congestion.  Patient was also feeling short of breath and was having some chest discomfort.  ED workup does not show any signs of acute coronary syndrome.  Serial troponins normal.  Have elevated BNP and some signs of vascular congestion on chest x-ray.  Patient was treated with IV Lasix with good diuresis.  Patient's symptoms have improved although not completely resolved.  Patient was able to walk around emergency department without any oxygen requirement.  Oxygen saturation maintained in the mid to high 90s.  Patient was also complaining of headache no acute findings noted on head CT's.  Suspect possibly related to viral illness.  Patient has responded to diuresis.  She has not taken her diuretic for a few days which would explain her worsening symptoms.  Will discharge home with Lasix.  Patient appears appropriate for outpatient management.        Final Clinical Impression(s) / ED Diagnoses Final diagnoses:  Viral upper respiratory tract infection  Acute on chronic congestive heart failure, unspecified heart failure type [facility])    Rx / DC Orders ED Discharge Orders          Ordered    furosemide (LASIX) 20 MG tablet  Daily        02/14/23 1504              Dorie Rank, MD 02/14/23 [phone]

## 2023-02-14 NOTE — Telephone Encounter (Signed)
Patient calls nurse line requesting to speak with PCP.   She reports she is currently in the [employer]. She reports she has been discussing a "personal matter" with PCP and is requesting a call back.   She reports her phone "is in the shop" and she asks you call her at [facility]. For reference she called on [phone].  Will forward to PCP.

## 2023-02-14 NOTE — ED Notes (Signed)
Pt able to ambulate without assistants. O2 saturation between 93-97. Pt stated she felt dizzy.

## 2023-02-14 NOTE — ED Notes (Signed)
Lab called to add on troponin 

## 2023-02-14 NOTE — ED Notes (Signed)
Patient transported to CT 

## 2023-02-14 NOTE — ED Triage Notes (Signed)
Pt BIB [facility] from home C/O SOB, CP since last night. Also reports headache that started that AM.  Hx COPD, CHF

## 2023-02-14 NOTE — Discharge Instructions (Addendum)
Take the Lasix as prescribed to help with your chronic fluid retention.  Follow-up with your primary care doctor to be rechecked.  Return as needed for worsening symptoms.

## 2023-02-15 DIAGNOSIS — I5020 Unspecified systolic (congestive) heart failure: Secondary | ICD-10-CM | POA: Diagnosis not present

## 2023-02-15 NOTE — Telephone Encounter (Signed)
Tried to call patient with no answer. Will try again later

## 2023-02-16 DIAGNOSIS — I5020 Unspecified systolic (congestive) heart failure: Secondary | ICD-10-CM | POA: Diagnosis not present

## 2023-02-17 DIAGNOSIS — I5020 Unspecified systolic (congestive) heart failure: Secondary | ICD-10-CM | POA: Diagnosis not present

## 2023-02-18 DIAGNOSIS — I5020 Unspecified systolic (congestive) heart failure: Secondary | ICD-10-CM | POA: Diagnosis not present

## 2023-02-19 DIAGNOSIS — I5020 Unspecified systolic (congestive) heart failure: Secondary | ICD-10-CM | POA: Diagnosis not present

## 2023-02-20 DIAGNOSIS — I5020 Unspecified systolic (congestive) heart failure: Secondary | ICD-10-CM | POA: Diagnosis not present

## 2023-02-21 ENCOUNTER — Other Ambulatory Visit (HOSPITAL_COMMUNITY): Payer: Self-pay

## 2023-02-21 ENCOUNTER — Other Ambulatory Visit: Payer: Self-pay | Admitting: Nurse Practitioner

## 2023-02-21 DIAGNOSIS — I5020 Unspecified systolic (congestive) heart failure: Secondary | ICD-10-CM | POA: Diagnosis not present

## 2023-02-21 DIAGNOSIS — C50412 Malignant neoplasm of upper-outer quadrant of left female breast: Secondary | ICD-10-CM

## 2023-02-21 MED ORDER — METHADONE HCL 5 MG PO TABS
15.0000 mg | ORAL_TABLET | Freq: Three times a day (TID) | ORAL | 0 refills | Status: DC | PRN
  Filled 2023-02-24: qty 270, 30d supply, fill #0

## 2023-02-22 ENCOUNTER — Other Ambulatory Visit (HOSPITAL_COMMUNITY): Payer: Self-pay

## 2023-02-22 DIAGNOSIS — I5020 Unspecified systolic (congestive) heart failure: Secondary | ICD-10-CM | POA: Diagnosis not present

## 2023-02-23 ENCOUNTER — Other Ambulatory Visit (HOSPITAL_COMMUNITY): Payer: Self-pay

## 2023-02-23 ENCOUNTER — Telehealth: Payer: Self-pay

## 2023-02-23 DIAGNOSIS — I5020 Unspecified systolic (congestive) heart failure: Secondary | ICD-10-CM | POA: Diagnosis not present

## 2023-02-23 NOTE — Telephone Encounter (Signed)
..   Medicaid Managed Care   Unsuccessful Outreach Note  02/23/2023 Name: [name] MRN: [id number] DOB: 13-Feb-1961  Referred by: Orvis Brill, DO Reason for referral : Appointment (I called to reschedule her missed phone appointments with the MM RNCM and BSW. )   Third unsuccessful telephone outreach was attempted today. The patient was referred to the case management team for assistance with care management and care coordination. The patient's primary care provider has been notified of our unsuccessful attempts to make or maintain contact with the patient. The care management team is pleased to engage with this patient at any time in the future should he/she be interested in assistance from the care management team.   Follow Up Plan: A HIPAA compliant phone message was left for the patient providing contact information and requesting a return call.  The care management team will reach out to the patient again over the next 7 days.   [place]

## 2023-02-24 ENCOUNTER — Inpatient Hospital Stay: Payer: Medicaid Other | Attending: Adult Health | Admitting: Nurse Practitioner

## 2023-02-24 ENCOUNTER — Other Ambulatory Visit (HOSPITAL_COMMUNITY): Payer: Self-pay

## 2023-02-24 ENCOUNTER — Telehealth: Payer: Self-pay

## 2023-02-24 ENCOUNTER — Encounter: Payer: Self-pay | Admitting: Nurse Practitioner

## 2023-02-24 DIAGNOSIS — Z515 Encounter for palliative care: Secondary | ICD-10-CM | POA: Diagnosis not present

## 2023-02-24 DIAGNOSIS — G893 Neoplasm related pain (acute) (chronic): Secondary | ICD-10-CM | POA: Diagnosis not present

## 2023-02-24 DIAGNOSIS — C50412 Malignant neoplasm of upper-outer quadrant of left female breast: Secondary | ICD-10-CM | POA: Diagnosis not present

## 2023-02-24 DIAGNOSIS — K5900 Constipation, unspecified: Secondary | ICD-10-CM

## 2023-02-24 DIAGNOSIS — I5020 Unspecified systolic (congestive) heart failure: Secondary | ICD-10-CM | POA: Diagnosis not present
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
## 2023-02-24 NOTE — Telephone Encounter (Signed)
Patient attempted to be outreached by Lazarus Gowda, PharmD Candidate on 02/23/2023   to discuss hypertension. Left voicemail for patient to return our call at their convenience at [phone].   Lazarus Gowda, PharmD Candidate   Maryan Puls, PharmD PGY-1 [facility] Pharmacy Resident

## 2023-02-25 DIAGNOSIS — I5020 Unspecified systolic (congestive) heart failure: Secondary | ICD-10-CM | POA: Diagnosis not present

## 2023-02-26 DIAGNOSIS — I5020 Unspecified systolic (congestive) heart failure: Secondary | ICD-10-CM | POA: Diagnosis not present

## 2023-02-27 DIAGNOSIS — I5020 Unspecified systolic (congestive) heart failure: Secondary | ICD-10-CM | POA: Diagnosis not present

## 2023-02-28 ENCOUNTER — Telehealth: Payer: Self-pay

## 2023-02-28 DIAGNOSIS — I5020 Unspecified systolic (congestive) heart failure: Secondary | ICD-10-CM | POA: Diagnosis not present

## 2023-02-28 NOTE — Telephone Encounter (Signed)
..   Medicaid Managed Care   Unsuccessful Outreach Note  02/28/2023 Name: [name] MRN: [id number] DOB: Jul 07, 1961  Referred by: Orvis Brill, DO Reason for referral : Appointment (I called the patient today to reschedule her missed phone appt with the [facility] RNCM. I left my name and number on her VM.)   A second unsuccessful telephone outreach was attempted today. The patient was referred to the case management team for assistance with care management and care coordination.   Follow Up Plan: A HIPAA compliant phone message was left for the patient providing contact information and requesting a return call.  The care management team will reach out to the patient again over the next 7 days.   [place]

## 2023-03-01 ENCOUNTER — Telehealth: Payer: Self-pay

## 2023-03-01 DIAGNOSIS — I5020 Unspecified systolic (congestive) heart failure: Secondary | ICD-10-CM | POA: Diagnosis not present

## 2023-03-01 NOTE — Telephone Encounter (Signed)
Patient calls nurse line requesting returned call from Dr. Owens Shark. She reports that she would like to speak with her regarding receiving a new referral for gynecologist. She reports that she has had to cancel hysterectomy multiple times with Dr. Berline Lopes and is no longer able to see this provider.   She is requesting returned call at [phone].  Talbot Grumbling, RN

## 2023-03-02 ENCOUNTER — Other Ambulatory Visit: Payer: Self-pay | Admitting: Student

## 2023-03-02 ENCOUNTER — Encounter: Payer: Self-pay | Admitting: Student

## 2023-03-02 DIAGNOSIS — N8502 Endometrial intraepithelial neoplasia [EIN]: Secondary | ICD-10-CM

## 2023-03-02 DIAGNOSIS — I5020 Unspecified systolic (congestive) heart failure: Secondary | ICD-10-CM | POA: Diagnosis not present

## 2023-03-03 DIAGNOSIS — I5020 Unspecified systolic (congestive) heart failure: Secondary | ICD-10-CM | POA: Diagnosis not present

## 2023-03-04 ENCOUNTER — Telehealth: Payer: Self-pay | Admitting: *Deleted

## 2023-03-04 ENCOUNTER — Telehealth: Payer: Self-pay | Admitting: Gynecologic Oncology

## 2023-03-04 DIAGNOSIS — I5020 Unspecified systolic (congestive) heart failure: Secondary | ICD-10-CM | POA: Diagnosis not present

## 2023-03-04 NOTE — Telephone Encounter (Signed)
My office received a referral from the patient's primary care provider to see another provider in our practice because I "would not see her."  The patient has never been told this by myself or my staff.  I called the patient multiple times after she canceled surgery most recently and left messages which were never returned.    I called her again today.  She apologized profusely for canceling her last surgery.  I reviewed again with her that it is unfair to the other patients in my practice when someone cancels appointments or surgeries repeatedly with short notice because this does not allow [name] to get other patients who are waiting scheduled.  I offered to have my office schedule the patient for a follow-up with me to perform a repeat endometrial biopsy as she is overdue for this and to discuss possibly rescheduling surgery.  The patient voiced interest in proceeding with scheduling an appointment. I stressed the importance of giving [name] advance notice if she needs to cancel or move this appointment.  She voiced understanding.  Jeral Pinch MD Gynecologic Oncology

## 2023-03-04 NOTE — Telephone Encounter (Signed)
Per Dr Berline Lopes, patient scheduled for a est patient appt with Dr Berline Lopes on 4/25

## 2023-03-05 DIAGNOSIS — I5020 Unspecified systolic (congestive) heart failure: Secondary | ICD-10-CM | POA: Diagnosis not present

## 2023-03-06 DIAGNOSIS — I5020 Unspecified systolic (congestive) heart failure: Secondary | ICD-10-CM | POA: Diagnosis not present

## 2023-03-07 ENCOUNTER — Other Ambulatory Visit: Payer: Medicaid Other | Admitting: *Deleted

## 2023-03-07 DIAGNOSIS — I5020 Unspecified systolic (congestive) heart failure: Secondary | ICD-10-CM | POA: Diagnosis not present

## 2023-03-07 NOTE — Patient Outreach (Signed)
   Embedded Care Coordination  Case Closure Note  03/07/2023 Name: [name] MRN: [id number] DOB: [birth year]  [name] is a 62 y.o. year old female who is a primary care patient of Orvis Brill, DO. The Embedded Care Coordination team was consulted for assistance with chronic disease management and care coordination needs related to CHF  We have been unable to make contact with the patient for follow up. The care management team is available to follow up with the patient after provider conversation with the patient regarding recommendation for care management engagement and subsequent re-referral to the care management team [phone]).   If patient returns call to provider office and is in need of assistance from the embedded care coordination team, please advise that the patient call the [facility] at [phone] for assistance.   [name] RN, BSN [place] RN Care Coordinator [phone]

## 2023-03-08 DIAGNOSIS — I5020 Unspecified systolic (congestive) heart failure: Secondary | ICD-10-CM | POA: Diagnosis not present

## 2023-03-09 DIAGNOSIS — I5020 Unspecified systolic (congestive) heart failure: Secondary | ICD-10-CM | POA: Diagnosis not present

## 2023-03-10 DIAGNOSIS — I5020 Unspecified systolic (congestive) heart failure: Secondary | ICD-10-CM | POA: Diagnosis not present

## 2023-03-11 DIAGNOSIS — I5020 Unspecified systolic (congestive) heart failure: Secondary | ICD-10-CM | POA: Diagnosis not present

## 2023-03-12 DIAGNOSIS — I5020 Unspecified systolic (congestive) heart failure: Secondary | ICD-10-CM | POA: Diagnosis not present

## 2023-03-13 DIAGNOSIS — I5020 Unspecified systolic (congestive) heart failure: Secondary | ICD-10-CM | POA: Diagnosis not present

## 2023-03-14 DIAGNOSIS — I5020 Unspecified systolic (congestive) heart failure: Secondary | ICD-10-CM | POA: Diagnosis not present

## 2023-03-14 NOTE — Progress Notes (Signed)
Patient attempted to be outreached by Darrall Dears, PharmD Candidate on 03/14/2023 to discuss hypertension. Left voicemail for patient to return our call at their convenience at [phone].   Darrall Dears,  PharmD Candidate   Joseph Art, Pharm.D. PGY-2 [facility] Resident

## 2023-03-15 DIAGNOSIS — I5020 Unspecified systolic (congestive) heart failure: Secondary | ICD-10-CM | POA: Diagnosis not present

## 2023-03-16 DIAGNOSIS — I5020 Unspecified systolic (congestive) heart failure: Secondary | ICD-10-CM | POA: Diagnosis not present

## 2023-03-17 ENCOUNTER — Ambulatory Visit: Payer: Medicaid Other | Admitting: Family Medicine

## 2023-03-17 DIAGNOSIS — I5020 Unspecified systolic (congestive) heart failure: Secondary | ICD-10-CM | POA: Diagnosis not present

## 2023-03-18 DIAGNOSIS — I5020 Unspecified systolic (congestive) heart failure: Secondary | ICD-10-CM | POA: Diagnosis not present

## 2023-03-19 DIAGNOSIS — I5020 Unspecified systolic (congestive) heart failure: Secondary | ICD-10-CM | POA: Diagnosis not present

## 2023-03-20 DIAGNOSIS — I5020 Unspecified systolic (congestive) heart failure: Secondary | ICD-10-CM | POA: Diagnosis not present

## 2023-03-21 DIAGNOSIS — I5020 Unspecified systolic (congestive) heart failure: Secondary | ICD-10-CM | POA: Diagnosis not present

## 2023-03-22 DIAGNOSIS — I5020 Unspecified systolic (congestive) heart failure: Secondary | ICD-10-CM | POA: Diagnosis not present

## 2023-03-23 ENCOUNTER — Other Ambulatory Visit: Payer: Self-pay | Admitting: Nurse Practitioner

## 2023-03-23 ENCOUNTER — Other Ambulatory Visit (HOSPITAL_COMMUNITY): Payer: Self-pay

## 2023-03-23 DIAGNOSIS — I5020 Unspecified systolic (congestive) heart failure: Secondary | ICD-10-CM | POA: Diagnosis not present

## 2023-03-23 DIAGNOSIS — C50412 Malignant neoplasm of upper-outer quadrant of left female breast: Secondary | ICD-10-CM

## 2023-03-23 MED ORDER — METHADONE HCL 5 MG PO TABS
15.0000 mg | ORAL_TABLET | Freq: Three times a day (TID) | ORAL | 0 refills | Status: DC | PRN
  Filled 2023-03-23 – 2023-03-24 (×2): qty 270, 30d supply, fill #0

## 2023-03-24 ENCOUNTER — Other Ambulatory Visit (HOSPITAL_COMMUNITY): Payer: Self-pay

## 2023-03-24 DIAGNOSIS — I5020 Unspecified systolic (congestive) heart failure: Secondary | ICD-10-CM | POA: Diagnosis not present

## 2023-03-25 DIAGNOSIS — I5020 Unspecified systolic (congestive) heart failure: Secondary | ICD-10-CM | POA: Diagnosis not present

## 2023-03-26 DIAGNOSIS — I5020 Unspecified systolic (congestive) heart failure: Secondary | ICD-10-CM | POA: Diagnosis not present

## 2023-03-27 DIAGNOSIS — I5020 Unspecified systolic (congestive) heart failure: Secondary | ICD-10-CM | POA: Diagnosis not present

## 2023-03-28 ENCOUNTER — Other Ambulatory Visit (HOSPITAL_COMMUNITY): Payer: Self-pay

## 2023-03-28 ENCOUNTER — Other Ambulatory Visit: Payer: Self-pay | Admitting: Student

## 2023-03-28 DIAGNOSIS — I5020 Unspecified systolic (congestive) heart failure: Secondary | ICD-10-CM | POA: Diagnosis not present

## 2023-03-28 MED ORDER — EZETIMIBE 10 MG PO TABS
10.0000 mg | ORAL_TABLET | Freq: Every day | ORAL | 1 refills | Status: DC
  Filled 2023-03-28 – 2023-04-23 (×3): qty 30, 30d supply, fill #0
  Filled 2023-06-07 – 2023-06-24 (×3): qty 30, 30d supply, fill #1

## 2023-03-28 MED ORDER — EMPAGLIFLOZIN 10 MG PO TABS
10.0000 mg | ORAL_TABLET | Freq: Every day | ORAL | 1 refills | Status: DC
  Filled 2023-03-28 – 2023-04-23 (×3): qty 30, 30d supply, fill #0
  Filled 2023-06-07 – 2023-06-24 (×3): qty 30, 30d supply, fill #1

## 2023-03-29 ENCOUNTER — Other Ambulatory Visit (HOSPITAL_COMMUNITY): Payer: Self-pay

## 2023-03-29 DIAGNOSIS — I5020 Unspecified systolic (congestive) heart failure: Secondary | ICD-10-CM | POA: Diagnosis not present

## 2023-03-30 DIAGNOSIS — I5020 Unspecified systolic (congestive) heart failure: Secondary | ICD-10-CM | POA: Diagnosis not present

## 2023-03-31 DIAGNOSIS — I5020 Unspecified systolic (congestive) heart failure: Secondary | ICD-10-CM | POA: Diagnosis not present

## 2023-04-01 DIAGNOSIS — I5020 Unspecified systolic (congestive) heart failure: Secondary | ICD-10-CM | POA: Diagnosis not present

## 2023-04-02 DIAGNOSIS — I5020 Unspecified systolic (congestive) heart failure: Secondary | ICD-10-CM | POA: Diagnosis not present

## 2023-04-03 DIAGNOSIS — I5020 Unspecified systolic (congestive) heart failure: Secondary | ICD-10-CM | POA: Diagnosis not present

## 2023-04-04 DIAGNOSIS — I5020 Unspecified systolic (congestive) heart failure: Secondary | ICD-10-CM | POA: Diagnosis not present

## 2023-04-05 ENCOUNTER — Other Ambulatory Visit (HOSPITAL_COMMUNITY): Payer: Self-pay

## 2023-04-05 DIAGNOSIS — I5020 Unspecified systolic (congestive) heart failure: Secondary | ICD-10-CM | POA: Diagnosis not present

## 2023-04-06 DIAGNOSIS — I5020 Unspecified systolic (congestive) heart failure: Secondary | ICD-10-CM | POA: Diagnosis not present

## 2023-04-07 DIAGNOSIS — I5020 Unspecified systolic (congestive) heart failure: Secondary | ICD-10-CM | POA: Diagnosis not present

## 2023-04-07 NOTE — Progress Notes (Deleted)
    SUBJECTIVE:   CHIEF COMPLAINT / HPI:   [name] is a 62 year old female here for follow-up.  Sick symptoms She reports sinus infection symptoms which started***days ago.  Reports/denies fever***.  Reports/denies other sick symptoms including cough, nausea, vomiting, diarrhea. Sick contacts***.  Postmenopausal bleeding Of note, she was supposed to have a complete hysterectomy with OB/GYN for complex atypical endometrial hyperplasia in the setting of postmenopausal bleeding, but due to patient canceling the surgery multiple times, she is unable to see that provider anymore. She presents today asking for repeat referral.  Hypertension She reports compliance with her medications*** Blood pressure readings at home shows*** Reports/denies headache, shortness of breath, chest pain, lower extremity edema.  Barriers to care Has issues with transportation.  Was also having housing issues at our last visit  PERTINENT  PMH / PSH: CHF, history of STEMI, cocaine use  OBJECTIVE:   LMP 08/07/2011   General: Alert and cooperative and appears to be in no acute distress Cardio: Normal S1 and S2, no S3 or S4. Rhythm is regular. No murmurs or rubs.   Pulm: Clear to auscultation bilaterally, no crackles, wheezing, or diminished breath sounds. Normal respiratory effort Abdomen: Bowel sounds normal. Abdomen soft and non-tender.  Extremities: No peripheral edema. Warm/ well perfused.  Strong radial pulses. Neuro: Cranial nerves grossly intact    ASSESSMENT/PLAN:   No problem-specific Assessment & Plan notes found for this encounter.     Darral Dash, DO [facility]    {    This will disappear when note is signed, click to select method of visit    :1}

## 2023-04-08 ENCOUNTER — Ambulatory Visit: Payer: Medicaid Other | Admitting: Student

## 2023-04-08 DIAGNOSIS — I5020 Unspecified systolic (congestive) heart failure: Secondary | ICD-10-CM | POA: Diagnosis not present

## 2023-04-09 DIAGNOSIS — I5020 Unspecified systolic (congestive) heart failure: Secondary | ICD-10-CM | POA: Diagnosis not present

## 2023-04-10 DIAGNOSIS — I5020 Unspecified systolic (congestive) heart failure: Secondary | ICD-10-CM | POA: Diagnosis not present

## 2023-04-11 ENCOUNTER — Telehealth: Payer: Self-pay

## 2023-04-11 ENCOUNTER — Inpatient Hospital Stay: Payer: Medicaid Other | Attending: Adult Health | Admitting: Nurse Practitioner

## 2023-04-11 ENCOUNTER — Inpatient Hospital Stay: Payer: Medicaid Other | Admitting: Nurse Practitioner

## 2023-04-11 DIAGNOSIS — R0602 Shortness of breath: Secondary | ICD-10-CM | POA: Insufficient documentation

## 2023-04-11 DIAGNOSIS — G893 Neoplasm related pain (acute) (chronic): Secondary | ICD-10-CM

## 2023-04-11 DIAGNOSIS — N8502 Endometrial intraepithelial neoplasia [EIN]: Secondary | ICD-10-CM | POA: Insufficient documentation

## 2023-04-11 DIAGNOSIS — F17210 Nicotine dependence, cigarettes, uncomplicated: Secondary | ICD-10-CM | POA: Insufficient documentation

## 2023-04-11 DIAGNOSIS — Z79890 Hormone replacement therapy (postmenopausal): Secondary | ICD-10-CM | POA: Insufficient documentation

## 2023-04-11 DIAGNOSIS — C50412 Malignant neoplasm of upper-outer quadrant of left female breast: Secondary | ICD-10-CM

## 2023-04-11 DIAGNOSIS — Z515 Encounter for palliative care: Secondary | ICD-10-CM | POA: Diagnosis not present

## 2023-04-11 DIAGNOSIS — Z853 Personal history of malignant neoplasm of breast: Secondary | ICD-10-CM | POA: Insufficient documentation

## 2023-04-11 DIAGNOSIS — I5020 Unspecified systolic (congestive) heart failure: Secondary | ICD-10-CM | POA: Diagnosis not present

## 2023-04-11 MED ORDER — METHADONE HCL 5 MG PO TABS
15.0000 mg | ORAL_TABLET | Freq: Three times a day (TID) | ORAL | 0 refills | Status: DC | PRN
  Filled 2023-04-22: qty 270, 30d supply, fill #0
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
## 2023-04-11 NOTE — Telephone Encounter (Signed)
LVM and callback number regarding changed appt

## 2023-04-12 ENCOUNTER — Other Ambulatory Visit (HOSPITAL_COMMUNITY): Payer: Self-pay

## 2023-04-12 DIAGNOSIS — I5020 Unspecified systolic (congestive) heart failure: Secondary | ICD-10-CM | POA: Diagnosis not present

## 2023-04-13 DIAGNOSIS — I5020 Unspecified systolic (congestive) heart failure: Secondary | ICD-10-CM | POA: Diagnosis not present
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
## 2023-04-14 ENCOUNTER — Telehealth: Payer: Self-pay | Admitting: Gynecologic Oncology

## 2023-04-14 ENCOUNTER — Inpatient Hospital Stay (HOSPITAL_BASED_OUTPATIENT_CLINIC_OR_DEPARTMENT_OTHER): Payer: Medicaid Other | Admitting: Gynecologic Oncology

## 2023-04-14 ENCOUNTER — Other Ambulatory Visit: Payer: Self-pay

## 2023-04-14 ENCOUNTER — Encounter: Payer: Self-pay | Admitting: Gynecologic Oncology

## 2023-04-14 VITALS — BP 111/68 | HR 95 | Temp 98.2°F | Resp 18 | Ht 64.0 in | Wt 221.0 lb

## 2023-04-14 DIAGNOSIS — N8502 Endometrial intraepithelial neoplasia [EIN]: Secondary | ICD-10-CM | POA: Diagnosis present

## 2023-04-14 DIAGNOSIS — I5020 Unspecified systolic (congestive) heart failure: Secondary | ICD-10-CM | POA: Diagnosis not present

## 2023-04-14 DIAGNOSIS — Z79890 Hormone replacement therapy (postmenopausal): Secondary | ICD-10-CM | POA: Diagnosis not present

## 2023-04-14 DIAGNOSIS — R0602 Shortness of breath: Secondary | ICD-10-CM | POA: Diagnosis not present

## 2023-04-14 DIAGNOSIS — Z853 Personal history of malignant neoplasm of breast: Secondary | ICD-10-CM | POA: Diagnosis not present

## 2023-04-14 DIAGNOSIS — F17210 Nicotine dependence, cigarettes, uncomplicated: Secondary | ICD-10-CM | POA: Diagnosis not present

## 2023-04-14 NOTE — Telephone Encounter (Signed)
Patient left paperwork today at my office from an attorney regarding possible lawsuit related to cancer/precancer as well as fibroids in the setting of using hair relaxer.  Patient asked that I look over the paperwork as she has not had time to.  I looked over this at the end of the day when she had left clinic.  This is all information that she needs to fill out and return to the law office.  I left a message letting her know this and letting her know we would keep the paperwork until she is able to come pick it up.  Eugene Garnet MD Gynecologic Oncology

## 2023-04-14 NOTE — Patient Instructions (Signed)
We will contact you with potential dates for surgery and then an appointment to meet with Dr. Pricilla Holm and Efraim Kaufmann NP to discuss surgery in detail.

## 2023-04-15 DIAGNOSIS — I5020 Unspecified systolic (congestive) heart failure: Secondary | ICD-10-CM | POA: Diagnosis not present

## 2023-04-16 DIAGNOSIS — I5020 Unspecified systolic (congestive) heart failure: Secondary | ICD-10-CM | POA: Diagnosis not present

## 2023-04-17 DIAGNOSIS — I5020 Unspecified systolic (congestive) heart failure: Secondary | ICD-10-CM | POA: Diagnosis not present

## 2023-04-18 ENCOUNTER — Telehealth: Payer: Self-pay | Admitting: *Deleted

## 2023-04-18 DIAGNOSIS — I5020 Unspecified systolic (congestive) heart failure: Secondary | ICD-10-CM | POA: Diagnosis not present

## 2023-04-18 LAB — SURGICAL PATHOLOGY

## 2023-04-18 NOTE — Telephone Encounter (Signed)
-----   Message from Dineen Kid, RN sent at 04/18/2023  5:01 PM EDT ----- Could someone please call this patient and pass along the message that her endometrial biopsy is negative (no cancer or precancer). This is great news.   ----- Message ----- From: Carver Fila, MD Sent: 04/18/2023   3:50 PM EDT To: Dineen Kid, RN; Kimberly Swaziland, CMA  Could someone please call this patient and pass along the message that her endometrial biopsy is negative (no cancer or precancer). This is great news.

## 2023-04-18 NOTE — Telephone Encounter (Signed)
Left a voicemail for patient (regarding her results) to call the office at her earliest convenience.

## 2023-04-18 NOTE — Progress Notes (Signed)
Could someone please call this patient and pass along the message that her endometrial biopsy is negative (no cancer or precancer). This is great news.

## 2023-04-19 ENCOUNTER — Telehealth: Payer: Self-pay

## 2023-04-19 ENCOUNTER — Telehealth: Payer: Self-pay | Admitting: *Deleted

## 2023-04-19 DIAGNOSIS — I5020 Unspecified systolic (congestive) heart failure: Secondary | ICD-10-CM | POA: Diagnosis not present

## 2023-04-19 NOTE — Telephone Encounter (Signed)
Left message for the patient to contact the office and schedule a telehealth visit.

## 2023-04-19 NOTE — Telephone Encounter (Signed)
   Name: [name]  DOB: 10-06-1961  MRN: [id number]  Primary Cardiologist: Lesleigh Noe, MD (Inactive)   Preoperative team, please contact this patient and set up a phone call appointment for further preoperative risk assessment. Please obtain consent and complete medication review. Thank you for your help.  I confirm that guidance regarding antiplatelet and oral anticoagulation therapy has been completed and, if necessary, noted below.  Regarding ASA therapy,   it may be stopped 5-7 days prior to surgery with a plan to resume it as soon as felt to be feasible from a surgical standpoint in the post-operative period.    Napoleon Form, Leodis Rains, NP 04/19/2023, 12:00 PM [facility]

## 2023-04-19 NOTE — Telephone Encounter (Signed)
   Pre-operative Risk Assessment    Patient Name: [name]  DOB: 1961/04/29 MRN: [id number]      Request for Surgical Clearance    Procedure:   ROBOTIC ASSISTED TOTAL HYSTERECTOMY, B/L SALPINGO-OOPHORECTOMY, SENTINEL NODE Bx, POSSIBLE LYMPH NODE DISSECTION  Date of Surgery:  Clearance 05/19/23                                 Surgeon:  DR. Eugene Garnet Surgeon's Group or Practice Name:  [facility] Phone number:  [phone] Fax number:  [phone]   Type of Clearance Requested:   - Medical ; NO MEDICATIONS ARE LISTED TO BE HELD   Type of Anesthesia:  General    Additional requests/questions:    Elpidio Anis   04/19/2023, 10:23 AM

## 2023-04-19 NOTE — Telephone Encounter (Signed)
-----   Message from Doylene Bode, NP sent at 04/19/2023  9:25 AM EDT ----- Please let the patient know her surgery has been scheduled for May 30.  She will need to be seen in the office for preop discussion before this.  This day is always subject to change and if that occurs we will let her know.  She will need a preop in the office with Pricilla Holm and myself before this.   Thanks

## 2023-04-19 NOTE — Telephone Encounter (Signed)
See below message from Warner Mccreedy NP  LVM for pt to call office. Surgery is scheduled for 5/30 Follow up with Pricilla Holm on 5/23 @ 3:30  Pre-op with [facility] @ 3:45.

## 2023-04-19 NOTE — Telephone Encounter (Signed)
Per Dr Pricilla Holm fax surgical optimization form to the patient's cardiology office

## 2023-04-20 ENCOUNTER — Other Ambulatory Visit (HOSPITAL_COMMUNITY): Payer: Self-pay

## 2023-04-20 DIAGNOSIS — I5020 Unspecified systolic (congestive) heart failure: Secondary | ICD-10-CM | POA: Diagnosis not present

## 2023-04-20 NOTE — Telephone Encounter (Signed)
LVM for patient to call office regarding surgery date and follow up appointment with Dr.Tucker. MyChart message sent

## 2023-04-20 NOTE — Telephone Encounter (Signed)
2nd attempt to reach the pt to schedule a tele pre op appt. I will send FYI to requesting office that the pt will need tele visit for pre op clearance.

## 2023-04-21 ENCOUNTER — Other Ambulatory Visit (HOSPITAL_COMMUNITY): Payer: Self-pay

## 2023-04-21 DIAGNOSIS — I5020 Unspecified systolic (congestive) heart failure: Secondary | ICD-10-CM | POA: Diagnosis not present

## 2023-04-21 NOTE — Telephone Encounter (Signed)
Our office has tried x 3 to reach the pt to schedule a tele appt for pre op. I will send FYI to surgeon pt has not been able to be reached. I will remove from the pre op call back pool at this time.

## 2023-04-21 NOTE — Telephone Encounter (Signed)
LMOM for the patient to call the office back to schedule appts with Dr Pricilla Holm and Warner Mccreedy APP

## 2023-04-22 ENCOUNTER — Telehealth: Payer: Self-pay

## 2023-04-22 ENCOUNTER — Other Ambulatory Visit (HOSPITAL_COMMUNITY): Payer: Self-pay

## 2023-04-22 ENCOUNTER — Other Ambulatory Visit: Payer: Self-pay | Admitting: Nurse Practitioner

## 2023-04-22 ENCOUNTER — Other Ambulatory Visit: Payer: Self-pay

## 2023-04-22 DIAGNOSIS — Z515 Encounter for palliative care: Secondary | ICD-10-CM

## 2023-04-22 DIAGNOSIS — I5020 Unspecified systolic (congestive) heart failure: Secondary | ICD-10-CM | POA: Diagnosis not present

## 2023-04-22 DIAGNOSIS — C50412 Malignant neoplasm of upper-outer quadrant of left female breast: Secondary | ICD-10-CM

## 2023-04-22 DIAGNOSIS — G893 Neoplasm related pain (acute) (chronic): Secondary | ICD-10-CM

## 2023-04-22 MED ORDER — METHADONE HCL 5 MG PO TABS
15.0000 mg | ORAL_TABLET | Freq: Three times a day (TID) | ORAL | 0 refills | Status: DC
Start: 2023-04-22 — End: 2023-05-19
  Filled 2023-04-22: qty 270, 30d supply, fill #0

## 2023-04-22 MED ORDER — METHADONE HCL 10 MG PO TABS
10.0000 mg | ORAL_TABLET | Freq: Three times a day (TID) | ORAL | 0 refills | Status: DC | PRN
Start: 2023-04-22 — End: 2023-04-22
  Filled 2023-04-22: qty 90, 30d supply, fill #0

## 2023-04-22 NOTE — Telephone Encounter (Signed)
-----   Message from Carver Fila, MD sent at 04/18/2023  3:50 PM EDT ----- Could someone please call this patient and pass along the message that her endometrial biopsy is negative (no cancer or precancer). This is great news.

## 2023-04-22 NOTE — Telephone Encounter (Signed)
Pt is aware of below pathology report from recent endometrial biopsy

## 2023-04-22 NOTE — Telephone Encounter (Signed)
Pt called requesting that the script be changed back to 15mg , as originally ordered. Pt reported that she was speaking with her insurance company. Per Lowella Bandy, NP. Original script re-sent to pharmacy. No further updates at this time.

## 2023-04-22 NOTE — Telephone Encounter (Signed)
I spoke to [name] this morning. She is aware of her surgery being scheduled for 5/30. She states she may not be in town that day and is wanting it changed to a day in June.  Warner Mccreedy NP notified.   I also advised her cardiology office has been trying to get in touch with her. She states she will call them today.

## 2023-04-22 NOTE — Telephone Encounter (Signed)
Patient called the office several times this morning regarding a refill of her methadone. Refill sent earlier in the week, PA needed per patient. This RN reached out to patient's pharmacy to confirm the needs for a PA, pharmacy tech confirmed, this RN then reached out to the [facility] medication PA team to work on the PA for Ms.Fotopoulos. Ms.Johannes continued to call the office and this RN as well as Lowella Bandy, NP spoke with the patient and explained that it is now up to patient's insurance company to approve the medication and that our office has no control over weather or not the PA is accepted by patient's insurance. It was also explained to the patient that the PA team has already submitted the PA, as confirmed by this RN, and we are waiting on an approval, patient also offered the option to pay of of pocket for medication. Patient verbalized frustration and understanding of situation.   In an effort to have PA approval pharmacy made [facility] team aware that the insurance company prefers a quantity of 3 pills a day maximum. NP discussed with patient and pt verbalized that she would like to titrate down on her methadone. Patient was previously on 15mg  of methadone 3 times a day, using 3, 5mg  tabs. Patient Insurance prefers that pt is placed on 1 tab 3 times a day, as the only available strengths are 5mg  tabs and 10mg  tabs, pt daily dose titrated down to 10mg  every 8 hours using 1, 10mg  tab of methadone 3 times a day. It was thoroughly explained to the patient by the NP that is is a reduction in dose, pt verbalized understanding and desire to titrate down. Order placed per Lowella Bandy, NP. This medication also needs a PA, PA tem made aware as well as the patient. Patient verbalized understanding and frustration of the situation once again.    RN and NP verbalized that they would call with any updates as they become available. At the time of this note there are not any changes to the approval of the mediation.

## 2023-04-22 NOTE — Telephone Encounter (Signed)
Pt called twice while this RN was away from the phone. This RN also received an update form the PA team stating that pt had 2 insurances on file at pharmacy and pt needed to call her medicaid to correct this issue, then the authorization for her medication would go through. RN called pt and LVM informing patient of the need to call her insurance company, this RN also provided pt with the number for her to call. No further needs at this time.

## 2023-04-23 ENCOUNTER — Other Ambulatory Visit (HOSPITAL_COMMUNITY): Payer: Self-pay

## 2023-04-23 DIAGNOSIS — I5020 Unspecified systolic (congestive) heart failure: Secondary | ICD-10-CM | POA: Diagnosis not present

## 2023-04-24 DIAGNOSIS — I5020 Unspecified systolic (congestive) heart failure: Secondary | ICD-10-CM | POA: Diagnosis not present

## 2023-04-25 ENCOUNTER — Other Ambulatory Visit (HOSPITAL_COMMUNITY): Payer: Self-pay

## 2023-04-25 ENCOUNTER — Other Ambulatory Visit: Payer: Self-pay | Admitting: Nurse Practitioner

## 2023-04-25 DIAGNOSIS — I5020 Unspecified systolic (congestive) heart failure: Secondary | ICD-10-CM | POA: Diagnosis not present

## 2023-04-26 ENCOUNTER — Ambulatory Visit: Payer: Medicaid Other | Admitting: Student

## 2023-04-26 DIAGNOSIS — I5020 Unspecified systolic (congestive) heart failure: Secondary | ICD-10-CM | POA: Diagnosis not present

## 2023-04-26 NOTE — Telephone Encounter (Signed)
LVM for Ms. Olwell to call the office regarding the new surgery date. Per Warner Mccreedy NP, potential surgery date is 7/17.

## 2023-04-27 DIAGNOSIS — I5020 Unspecified systolic (congestive) heart failure: Secondary | ICD-10-CM | POA: Diagnosis not present

## 2023-04-27 NOTE — Telephone Encounter (Signed)
I have left messages for patient to call office regarding upcoming surgery on 5/30. MyChart message has been sent as well.  Dr.Tucker aware of not being able to reach patient.

## 2023-04-28 DIAGNOSIS — I5020 Unspecified systolic (congestive) heart failure: Secondary | ICD-10-CM | POA: Diagnosis not present

## 2023-04-29 DIAGNOSIS — I5020 Unspecified systolic (congestive) heart failure: Secondary | ICD-10-CM | POA: Diagnosis not present

## 2023-04-29 NOTE — Telephone Encounter (Signed)
Thank you - surgery will be rescheduled

## 2023-04-30 DIAGNOSIS — I5020 Unspecified systolic (congestive) heart failure: Secondary | ICD-10-CM | POA: Diagnosis not present

## 2023-05-01 DIAGNOSIS — I5020 Unspecified systolic (congestive) heart failure: Secondary | ICD-10-CM | POA: Diagnosis not present

## 2023-05-02 ENCOUNTER — Telehealth: Payer: Self-pay | Admitting: *Deleted

## 2023-05-02 ENCOUNTER — Other Ambulatory Visit: Payer: Self-pay | Admitting: Gynecologic Oncology

## 2023-05-02 ENCOUNTER — Other Ambulatory Visit (HOSPITAL_COMMUNITY): Payer: Self-pay

## 2023-05-02 DIAGNOSIS — N8502 Endometrial intraepithelial neoplasia [EIN]: Secondary | ICD-10-CM

## 2023-05-02 DIAGNOSIS — I5020 Unspecified systolic (congestive) heart failure: Secondary | ICD-10-CM | POA: Diagnosis not present

## 2023-05-02 MED ORDER — MEDROXYPROGESTERONE ACETATE 10 MG PO TABS
20.0000 mg | ORAL_TABLET | Freq: Every day | ORAL | 1 refills | Status: DC
Start: 2023-05-02 — End: 2023-05-02
  Filled 2023-05-02: qty 60, 30d supply, fill #0

## 2023-05-02 MED ORDER — MEDROXYPROGESTERONE ACETATE 10 MG PO TABS
10.0000 mg | ORAL_TABLET | Freq: Every day | ORAL | 1 refills | Status: DC
Start: 2023-05-02 — End: 2023-08-10
  Filled 2023-05-02 – 2023-05-19 (×2): qty 30, 30d supply, fill #0
  Filled 2023-06-24: qty 30, 30d supply, fill #1

## 2023-05-02 NOTE — Telephone Encounter (Signed)
Patient requested refill on provera

## 2023-05-02 NOTE — Telephone Encounter (Signed)
Spoke with the patient and moved surgery date from 5/30 to 7/17. Patient aware of new date and new dates of pre op appts. Patient aware that she needs cardiology clearance before surgery and her new appts

## 2023-05-03 ENCOUNTER — Other Ambulatory Visit (HOSPITAL_COMMUNITY): Payer: Self-pay

## 2023-05-03 DIAGNOSIS — I5020 Unspecified systolic (congestive) heart failure: Secondary | ICD-10-CM | POA: Diagnosis not present

## 2023-05-04 ENCOUNTER — Other Ambulatory Visit (HOSPITAL_COMMUNITY): Payer: Self-pay

## 2023-05-04 DIAGNOSIS — I5020 Unspecified systolic (congestive) heart failure: Secondary | ICD-10-CM | POA: Diagnosis not present

## 2023-05-05 DIAGNOSIS — I5020 Unspecified systolic (congestive) heart failure: Secondary | ICD-10-CM | POA: Diagnosis not present

## 2023-05-06 ENCOUNTER — Other Ambulatory Visit (HOSPITAL_COMMUNITY): Payer: Self-pay

## 2023-05-06 DIAGNOSIS — I5020 Unspecified systolic (congestive) heart failure: Secondary | ICD-10-CM | POA: Diagnosis not present

## 2023-05-07 DIAGNOSIS — I5020 Unspecified systolic (congestive) heart failure: Secondary | ICD-10-CM | POA: Diagnosis not present

## 2023-05-08 DIAGNOSIS — I5020 Unspecified systolic (congestive) heart failure: Secondary | ICD-10-CM | POA: Diagnosis not present

## 2023-05-09 ENCOUNTER — Other Ambulatory Visit (HOSPITAL_COMMUNITY): Payer: Self-pay

## 2023-05-09 DIAGNOSIS — I5020 Unspecified systolic (congestive) heart failure: Secondary | ICD-10-CM | POA: Diagnosis not present

## 2023-05-10 ENCOUNTER — Ambulatory Visit: Payer: Medicaid Other | Admitting: Student

## 2023-05-10 DIAGNOSIS — I5020 Unspecified systolic (congestive) heart failure: Secondary | ICD-10-CM | POA: Diagnosis not present

## 2023-05-11 DIAGNOSIS — I5020 Unspecified systolic (congestive) heart failure: Secondary | ICD-10-CM | POA: Diagnosis not present

## 2023-05-12 ENCOUNTER — Ambulatory Visit: Payer: Medicaid Other | Admitting: Gynecologic Oncology

## 2023-05-12 ENCOUNTER — Other Ambulatory Visit (HOSPITAL_COMMUNITY): Payer: Self-pay

## 2023-05-12 ENCOUNTER — Encounter: Payer: Medicaid Other | Admitting: Gynecologic Oncology

## 2023-05-12 DIAGNOSIS — I5020 Unspecified systolic (congestive) heart failure: Secondary | ICD-10-CM | POA: Diagnosis not present

## 2023-05-13 ENCOUNTER — Other Ambulatory Visit (HOSPITAL_COMMUNITY): Payer: Self-pay

## 2023-05-13 DIAGNOSIS — I5020 Unspecified systolic (congestive) heart failure: Secondary | ICD-10-CM | POA: Diagnosis not present

## 2023-05-14 DIAGNOSIS — I5020 Unspecified systolic (congestive) heart failure: Secondary | ICD-10-CM | POA: Diagnosis not present

## 2023-05-15 DIAGNOSIS — I5020 Unspecified systolic (congestive) heart failure: Secondary | ICD-10-CM | POA: Diagnosis not present

## 2023-05-16 DIAGNOSIS — I5020 Unspecified systolic (congestive) heart failure: Secondary | ICD-10-CM | POA: Diagnosis not present
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
## 2023-05-17 ENCOUNTER — Inpatient Hospital Stay: Payer: Medicaid Other | Attending: Adult Health | Admitting: Nurse Practitioner

## 2023-05-17 ENCOUNTER — Other Ambulatory Visit (HOSPITAL_COMMUNITY): Payer: Self-pay

## 2023-05-17 DIAGNOSIS — I5020 Unspecified systolic (congestive) heart failure: Secondary | ICD-10-CM | POA: Diagnosis not present

## 2023-05-18 ENCOUNTER — Other Ambulatory Visit (HOSPITAL_COMMUNITY): Payer: Self-pay

## 2023-05-18 DIAGNOSIS — I5020 Unspecified systolic (congestive) heart failure: Secondary | ICD-10-CM | POA: Diagnosis not present

## 2023-05-19 ENCOUNTER — Other Ambulatory Visit: Payer: Self-pay | Admitting: Nurse Practitioner

## 2023-05-19 ENCOUNTER — Other Ambulatory Visit (HOSPITAL_COMMUNITY): Payer: Self-pay

## 2023-05-19 DIAGNOSIS — G893 Neoplasm related pain (acute) (chronic): Secondary | ICD-10-CM

## 2023-05-19 DIAGNOSIS — C50412 Malignant neoplasm of upper-outer quadrant of left female breast: Secondary | ICD-10-CM

## 2023-05-19 DIAGNOSIS — N8502 Endometrial intraepithelial neoplasia [EIN]: Secondary | ICD-10-CM

## 2023-05-19 DIAGNOSIS — Z515 Encounter for palliative care: Secondary | ICD-10-CM

## 2023-05-19 DIAGNOSIS — I5020 Unspecified systolic (congestive) heart failure: Secondary | ICD-10-CM | POA: Diagnosis not present

## 2023-05-19 MED ORDER — METHADONE HCL 5 MG PO TABS
15.0000 mg | ORAL_TABLET | Freq: Three times a day (TID) | ORAL | 0 refills | Status: DC
Start: 2023-05-23 — End: 2023-06-14
  Filled 2023-05-23 (×2): qty 270, 30d supply, fill #0

## 2023-05-20 ENCOUNTER — Other Ambulatory Visit (HOSPITAL_COMMUNITY): Payer: Self-pay

## 2023-05-20 ENCOUNTER — Telehealth: Payer: Self-pay

## 2023-05-20 DIAGNOSIS — I5020 Unspecified systolic (congestive) heart failure: Secondary | ICD-10-CM | POA: Diagnosis not present

## 2023-05-20 NOTE — Telephone Encounter (Signed)
Pt returned call from missed appt this week, spoke with this RN and Lowella Bandy, NP, reported everything is "going well", pain is well controlled, no diarrhea/constipation, sleeping well,a dn going for walks. Pt refill of methadone sent into preferred pharmacy, no further needs at this time.

## 2023-05-21 DIAGNOSIS — I5020 Unspecified systolic (congestive) heart failure: Secondary | ICD-10-CM | POA: Diagnosis not present

## 2023-05-22 DIAGNOSIS — I5020 Unspecified systolic (congestive) heart failure: Secondary | ICD-10-CM | POA: Diagnosis not present

## 2023-05-23 ENCOUNTER — Other Ambulatory Visit (HOSPITAL_COMMUNITY): Payer: Self-pay

## 2023-05-23 ENCOUNTER — Other Ambulatory Visit: Payer: Self-pay

## 2023-05-23 ENCOUNTER — Telehealth: Payer: Self-pay

## 2023-05-23 ENCOUNTER — Other Ambulatory Visit (HOSPITAL_BASED_OUTPATIENT_CLINIC_OR_DEPARTMENT_OTHER): Payer: Self-pay

## 2023-05-23 DIAGNOSIS — I5020 Unspecified systolic (congestive) heart failure: Secondary | ICD-10-CM | POA: Diagnosis not present

## 2023-05-23 NOTE — Telephone Encounter (Signed)
Pt called reporting that she was unable to pick up her methadone. This RN called and confirmed with pharmacy and pharmacy confirmed that this is the same problem the pt faced last month, pt has 2 insurances on file while she is really only covered by her medicaid. This RN spoke with the pt and at the time of the call pt was on hold with the medicaid office to clarify. This RN confirmed with the pt what needed to be done per the pharmacy and pt verbalized understanding, no further needs at this time.

## 2023-05-24 ENCOUNTER — Other Ambulatory Visit (HOSPITAL_COMMUNITY): Payer: Self-pay

## 2023-05-24 ENCOUNTER — Other Ambulatory Visit (HOSPITAL_BASED_OUTPATIENT_CLINIC_OR_DEPARTMENT_OTHER): Payer: Self-pay

## 2023-05-24 DIAGNOSIS — I5020 Unspecified systolic (congestive) heart failure: Secondary | ICD-10-CM | POA: Diagnosis not present

## 2023-05-25 ENCOUNTER — Other Ambulatory Visit (HOSPITAL_COMMUNITY): Payer: Self-pay

## 2023-05-25 DIAGNOSIS — I5020 Unspecified systolic (congestive) heart failure: Secondary | ICD-10-CM | POA: Diagnosis not present

## 2023-05-26 DIAGNOSIS — I5020 Unspecified systolic (congestive) heart failure: Secondary | ICD-10-CM | POA: Diagnosis not present

## 2023-05-27 DIAGNOSIS — I5020 Unspecified systolic (congestive) heart failure: Secondary | ICD-10-CM | POA: Diagnosis not present

## 2023-05-28 DIAGNOSIS — I5020 Unspecified systolic (congestive) heart failure: Secondary | ICD-10-CM | POA: Diagnosis not present

## 2023-05-29 DIAGNOSIS — I5020 Unspecified systolic (congestive) heart failure: Secondary | ICD-10-CM | POA: Diagnosis not present

## 2023-05-30 DIAGNOSIS — I5020 Unspecified systolic (congestive) heart failure: Secondary | ICD-10-CM | POA: Diagnosis not present

## 2023-05-31 DIAGNOSIS — I5020 Unspecified systolic (congestive) heart failure: Secondary | ICD-10-CM | POA: Diagnosis not present

## 2023-06-01 DIAGNOSIS — I5020 Unspecified systolic (congestive) heart failure: Secondary | ICD-10-CM | POA: Diagnosis not present

## 2023-06-02 DIAGNOSIS — I5020 Unspecified systolic (congestive) heart failure: Secondary | ICD-10-CM | POA: Diagnosis not present

## 2023-06-03 DIAGNOSIS — I5020 Unspecified systolic (congestive) heart failure: Secondary | ICD-10-CM | POA: Diagnosis not present

## 2023-06-04 DIAGNOSIS — I5020 Unspecified systolic (congestive) heart failure: Secondary | ICD-10-CM | POA: Diagnosis not present

## 2023-06-05 DIAGNOSIS — I5020 Unspecified systolic (congestive) heart failure: Secondary | ICD-10-CM | POA: Diagnosis not present

## 2023-06-06 DIAGNOSIS — I5020 Unspecified systolic (congestive) heart failure: Secondary | ICD-10-CM | POA: Diagnosis not present

## 2023-06-07 ENCOUNTER — Other Ambulatory Visit (HOSPITAL_COMMUNITY): Payer: Self-pay

## 2023-06-07 ENCOUNTER — Other Ambulatory Visit (HOSPITAL_COMMUNITY): Payer: Self-pay | Admitting: Emergency Medicine

## 2023-06-07 ENCOUNTER — Other Ambulatory Visit: Payer: Self-pay | Admitting: Student

## 2023-06-07 DIAGNOSIS — I5020 Unspecified systolic (congestive) heart failure: Secondary | ICD-10-CM | POA: Diagnosis not present

## 2023-06-07 MED ORDER — POTASSIUM CHLORIDE CRYS ER 20 MEQ PO TBCR
20.0000 meq | EXTENDED_RELEASE_TABLET | Freq: Three times a day (TID) | ORAL | 0 refills | Status: DC
  Filled 2023-06-07 – 2023-06-20 (×2): qty 270, 90d supply, fill #0

## 2023-06-08 ENCOUNTER — Other Ambulatory Visit (HOSPITAL_COMMUNITY): Payer: Self-pay

## 2023-06-08 ENCOUNTER — Other Ambulatory Visit: Payer: Self-pay

## 2023-06-08 DIAGNOSIS — I5020 Unspecified systolic (congestive) heart failure: Secondary | ICD-10-CM | POA: Diagnosis not present

## 2023-06-09 ENCOUNTER — Other Ambulatory Visit (HOSPITAL_COMMUNITY): Payer: Self-pay

## 2023-06-09 DIAGNOSIS — I5020 Unspecified systolic (congestive) heart failure: Secondary | ICD-10-CM | POA: Diagnosis not present

## 2023-06-10 ENCOUNTER — Telehealth: Payer: Self-pay | Admitting: *Deleted

## 2023-06-10 DIAGNOSIS — I5020 Unspecified systolic (congestive) heart failure: Secondary | ICD-10-CM | POA: Diagnosis not present

## 2023-06-10 NOTE — Telephone Encounter (Signed)
Attempted to reach Ms. Goga in regards to message from Warner Mccreedy, NP Patient is supposed to see cardiology before her surgery is scheduled. Left voicemail for patient to call the office at [phone].

## 2023-06-10 NOTE — Telephone Encounter (Signed)
-----   Message from Doylene Bode, NP sent at 06/09/2023  3:22 PM EDT ----- This patient is supposed to see cardiology before surgery. It does not look this has been set up and she cancels and no shows frequently. Want her to be aware this would have to take place before surgery. Given this, we will wait for this to take place before picking a surgery date

## 2023-06-12 DIAGNOSIS — I5020 Unspecified systolic (congestive) heart failure: Secondary | ICD-10-CM | POA: Diagnosis not present

## 2023-06-13 ENCOUNTER — Ambulatory Visit: Payer: Medicaid Other | Admitting: Student

## 2023-06-13 DIAGNOSIS — I5020 Unspecified systolic (congestive) heart failure: Secondary | ICD-10-CM | POA: Diagnosis not present

## 2023-06-13 NOTE — Telephone Encounter (Signed)
Thank you - please try to reach out again later this week.

## 2023-06-13 NOTE — Telephone Encounter (Signed)
Called patient regarding Melissa's note, LVM.

## 2023-06-14 ENCOUNTER — Telehealth: Payer: Self-pay | Admitting: *Deleted

## 2023-06-14 ENCOUNTER — Telehealth: Payer: Self-pay | Admitting: Student

## 2023-06-14 ENCOUNTER — Other Ambulatory Visit (HOSPITAL_COMMUNITY): Payer: Self-pay

## 2023-06-14 ENCOUNTER — Other Ambulatory Visit: Payer: Self-pay | Admitting: Nurse Practitioner

## 2023-06-14 DIAGNOSIS — Z515 Encounter for palliative care: Secondary | ICD-10-CM

## 2023-06-14 DIAGNOSIS — G893 Neoplasm related pain (acute) (chronic): Secondary | ICD-10-CM

## 2023-06-14 DIAGNOSIS — C50412 Malignant neoplasm of upper-outer quadrant of left female breast: Secondary | ICD-10-CM

## 2023-06-14 DIAGNOSIS — I5020 Unspecified systolic (congestive) heart failure: Secondary | ICD-10-CM | POA: Diagnosis not present

## 2023-06-14 MED ORDER — METHADONE HCL 5 MG PO TABS
15.0000 mg | ORAL_TABLET | Freq: Three times a day (TID) | ORAL | 0 refills | Status: DC
Start: 2023-06-20 — End: 2023-07-22
  Filled 2023-06-20: qty 270, 30d supply, fill #0

## 2023-06-14 NOTE — Telephone Encounter (Signed)
2nd attempt to reach patient in regards to her making an appointment with her cardiologist prior to having her surgery. Left a voicemail requesting a call back at [phone].

## 2023-06-14 NOTE — Telephone Encounter (Addendum)
[name] called the office and left a message stating that she would be following up with her cardiologist before her surgery. Patient also stated on voicemail that the office could call her back but she has  difficulty with her phone because it has to be charged often.   Attempted to call the patient back and was unable to reach her. Voicemail was left requesting a call back. According to patient's documented appointments she only has a follow up for surgery clearance with Dr. Jerre Simon on July 5 at  3:30.

## 2023-06-14 NOTE — Telephone Encounter (Signed)
[employer] is calling to check on the status of the PCS form being completed for patient. The form was faxed to [name] on 06/03/23. It is in Dr. Weldon Picking box.   They will need this as soon as possible including her last office visit notes. It has been over the 5 day turn around time.

## 2023-06-15 ENCOUNTER — Other Ambulatory Visit (HOSPITAL_COMMUNITY): Payer: Self-pay

## 2023-06-15 ENCOUNTER — Telehealth: Payer: Self-pay | Admitting: *Deleted

## 2023-06-15 DIAGNOSIS — I5020 Unspecified systolic (congestive) heart failure: Secondary | ICD-10-CM | POA: Diagnosis not present

## 2023-06-15 NOTE — Telephone Encounter (Signed)
Pt called today to let us know that her surgery has been moved to 07/06/23. I called her back and left message that she needs to call the office as she still needs her tele pre op appt. Please see all previous notes. Our office had tried previously to reach pt to set up tele appt, which after 3 attempts they are removed from the pre op call back pool until the pt calls back.    Pt just called today to let our office know that her surgery is 07/06/23. I called her back and left message that she needs to call the office as she still needs her tele pre op appt.

## 2023-06-15 NOTE — Telephone Encounter (Signed)
Ms. Adee called the office and left a voicemail stating she has called her Cardiologist office -[facility]. And, "they will call pre-op then schedule with me."  From previous phone conversation, this office advised Patient that her surgery would not be scheduled until she has made a cardiology appointment and they would have to clear her for surgery.

## 2023-06-15 NOTE — Telephone Encounter (Signed)
Spoke with Ms. Mikels in regards to following up with her Cardiologist prior to scheduling a date for her surgery with Dr. Pricilla Holm.   Patient states, "I am well aware that I need to make an appointment with my cardiologist before my surgery"  The office asked the patient if she has called to make the appointment. Pt states, "I will call them tomorrow or later today before 4pm" My cardiologist, Dr. Verdis Prime III at [facility] retired, but I will see someone else in the office. They are located on [address] across from [facility].  Patient advised once she has made that appt. To please reach out to our office at [phone]. Pt agreed that she would call the office once she has made an appointment.

## 2023-06-15 NOTE — Telephone Encounter (Signed)
Patient is calling back, states the surgery been moved to 7/17. Please advise

## 2023-06-16 ENCOUNTER — Other Ambulatory Visit (HOSPITAL_COMMUNITY): Payer: Self-pay

## 2023-06-16 ENCOUNTER — Telehealth: Payer: Self-pay | Admitting: *Deleted

## 2023-06-16 DIAGNOSIS — I5020 Unspecified systolic (congestive) heart failure: Secondary | ICD-10-CM | POA: Diagnosis not present

## 2023-06-16 NOTE — Telephone Encounter (Signed)
I able to reach the pt today and she has been scheduled for tele pre op appt 06/22/23 @ 9:40. Med rec and consent are done. I will update al parties involved.

## 2023-06-16 NOTE — Telephone Encounter (Signed)
I able to reach the pt today and she has been scheduled for tele pre op appt 06/22/23 @ 9:40. Med rec and consent are done. I will update al parties involved.     Patient Consent for Virtual Visit        [name] has provided verbal consent on 06/16/2023 for a virtual visit (video or telephone).   CONSENT FOR VIRTUAL VISIT FOR:  [name]  By participating in this virtual visit I agree to the following:  I hereby voluntarily request, consent and authorize [facility] and its employed or contracted physicians, physician assistants, nurse practitioners or other licensed health care professionals (the Practitioner), to provide me with telemedicine health care services (the "Services") as deemed necessary by the treating Practitioner. I acknowledge and consent to receive the Services by the Practitioner via telemedicine. I understand that the telemedicine visit will involve communicating with the Practitioner through live audiovisual communication technology and the disclosure of certain medical information by electronic transmission. I acknowledge that I have been given the opportunity to request an in-person assessment or other available alternative prior to the telemedicine visit and am voluntarily participating in the telemedicine visit.  I understand that I have the right to withhold or withdraw my consent to the use of telemedicine in the course of my care at any time, without affecting my right to future care or treatment, and that the Practitioner or I may terminate the telemedicine visit at any time. I understand that I have the right to inspect all information obtained and/or recorded in the course of the telemedicine visit and may receive copies of available information for a reasonable fee.  I understand that some of the potential risks of receiving the Services via telemedicine include:  Delay or interruption in medical evaluation due to technological equipment failure  or disruption; Information transmitted may not be sufficient (e.g. poor resolution of images) to allow for appropriate medical decision making by the Practitioner; and/or  In rare instances, security protocols could fail, causing a breach of personal health information.  Furthermore, I acknowledge that it is my responsibility to provide information about my medical history, conditions and care that is complete and accurate to the best of my ability. I acknowledge that Practitioner's advice, recommendations, and/or decision may be based on factors not within their control, such as incomplete or inaccurate data provided by me or distortions of diagnostic images or specimens that may result from electronic transmissions. I understand that the practice of medicine is not an exact science and that Practitioner makes no warranties or guarantees regarding treatment outcomes. I acknowledge that a copy of this consent can be made available to me via my patient portal [facility] MyChart), or I can request a printed copy by calling the office of [facility].    I understand that my insurance will be billed for this visit.   I have read or had this consent read to me. I understand the contents of this consent, which adequately explains the benefits and risks of the Services being provided via telemedicine.  I have been provided ample opportunity to ask questions regarding this consent and the Services and have had my questions answered to my satisfaction. I give my informed consent for the services to be provided through the use of telemedicine in my medical care

## 2023-06-16 NOTE — Telephone Encounter (Signed)
Thank you :)

## 2023-06-17 DIAGNOSIS — I5020 Unspecified systolic (congestive) heart failure: Secondary | ICD-10-CM | POA: Diagnosis not present

## 2023-06-18 DIAGNOSIS — I5020 Unspecified systolic (congestive) heart failure: Secondary | ICD-10-CM | POA: Diagnosis not present

## 2023-06-19 DIAGNOSIS — I5020 Unspecified systolic (congestive) heart failure: Secondary | ICD-10-CM | POA: Diagnosis not present

## 2023-06-20 ENCOUNTER — Other Ambulatory Visit (HOSPITAL_COMMUNITY): Payer: Self-pay

## 2023-06-20 ENCOUNTER — Other Ambulatory Visit: Payer: Self-pay

## 2023-06-20 DIAGNOSIS — I5020 Unspecified systolic (congestive) heart failure: Secondary | ICD-10-CM | POA: Diagnosis not present

## 2023-06-21 ENCOUNTER — Other Ambulatory Visit (HOSPITAL_COMMUNITY): Payer: Self-pay

## 2023-06-21 DIAGNOSIS — I5020 Unspecified systolic (congestive) heart failure: Secondary | ICD-10-CM | POA: Diagnosis not present

## 2023-06-22 ENCOUNTER — Other Ambulatory Visit (HOSPITAL_COMMUNITY): Payer: Self-pay

## 2023-06-22 ENCOUNTER — Other Ambulatory Visit: Payer: Self-pay | Admitting: Student

## 2023-06-22 ENCOUNTER — Ambulatory Visit: Payer: Medicaid Other | Attending: Cardiovascular Disease | Admitting: Student

## 2023-06-22 DIAGNOSIS — Z01810 Encounter for preprocedural cardiovascular examination: Secondary | ICD-10-CM | POA: Diagnosis not present

## 2023-06-22 DIAGNOSIS — I5020 Unspecified systolic (congestive) heart failure: Secondary | ICD-10-CM | POA: Diagnosis not present
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
## 2023-06-23 DIAGNOSIS — I5020 Unspecified systolic (congestive) heart failure: Secondary | ICD-10-CM | POA: Diagnosis not present

## 2023-06-24 ENCOUNTER — Other Ambulatory Visit (HOSPITAL_COMMUNITY): Payer: Self-pay

## 2023-06-24 ENCOUNTER — Ambulatory Visit: Payer: Medicaid Other | Admitting: Student

## 2023-06-24 DIAGNOSIS — I5020 Unspecified systolic (congestive) heart failure: Secondary | ICD-10-CM | POA: Diagnosis not present

## 2023-06-25 DIAGNOSIS — I5020 Unspecified systolic (congestive) heart failure: Secondary | ICD-10-CM | POA: Diagnosis not present

## 2023-06-26 DIAGNOSIS — I5020 Unspecified systolic (congestive) heart failure: Secondary | ICD-10-CM | POA: Diagnosis not present

## 2023-06-27 ENCOUNTER — Encounter: Payer: Self-pay | Admitting: Gynecologic Oncology

## 2023-06-27 DIAGNOSIS — I5020 Unspecified systolic (congestive) heart failure: Secondary | ICD-10-CM | POA: Diagnosis not present

## 2023-06-28 DIAGNOSIS — I5020 Unspecified systolic (congestive) heart failure: Secondary | ICD-10-CM | POA: Diagnosis not present

## 2023-06-29 DIAGNOSIS — I5020 Unspecified systolic (congestive) heart failure: Secondary | ICD-10-CM | POA: Diagnosis not present
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
## 2023-06-30 ENCOUNTER — Other Ambulatory Visit: Payer: Self-pay

## 2023-06-30 ENCOUNTER — Inpatient Hospital Stay: Payer: Medicaid Other | Admitting: Gynecologic Oncology

## 2023-06-30 ENCOUNTER — Inpatient Hospital Stay: Payer: Medicaid Other | Attending: Adult Health | Admitting: Gynecologic Oncology

## 2023-06-30 ENCOUNTER — Ambulatory Visit: Payer: Medicaid Other | Admitting: Family Medicine

## 2023-06-30 VITALS — BP 133/87 | HR 91 | Temp 98.4°F | Resp 17 | Ht 65.0 in | Wt 207.8 lb

## 2023-06-30 DIAGNOSIS — M79662 Pain in left lower leg: Secondary | ICD-10-CM

## 2023-06-30 DIAGNOSIS — N8502 Endometrial intraepithelial neoplasia [EIN]: Secondary | ICD-10-CM

## 2023-06-30 DIAGNOSIS — M79661 Pain in right lower leg: Secondary | ICD-10-CM

## 2023-06-30 DIAGNOSIS — R600 Localized edema: Secondary | ICD-10-CM

## 2023-06-30 DIAGNOSIS — I5020 Unspecified systolic (congestive) heart failure: Secondary | ICD-10-CM | POA: Diagnosis not present

## 2023-06-30 NOTE — Progress Notes (Signed)
Patient here for follow up and for a pre-operative appointment prior to her scheduled surgery on July 27, 2023. She is scheduled for robotic assisted total laparoscopic hysterectomy, bilateral salpingo-oophorectomy, sentinel lymph node biopsy, possible lymph node dissection, possible laparotomy.  The surgery was discussed in detail.  See after visit summary for additional details.    Discussed post-op pain management in detail including the aspects of the enhanced recovery pathway. We discussed the use of tylenol post-op and to monitor for a maximum of 4,000 mg in a 24 hour period.  She will continue on her home methadone regimen. Also plan to prescribe sennakot to be used after surgery and to hold if having loose stools.  Discussed bowel regimen in detail.     Discussed the use of SCDs and measures to take at home to prevent DVT including frequent mobility.  Reportable signs and symptoms of DVT discussed. Post-operative instructions discussed and expectations for after surgery. Incisional care discussed as well including reportable signs and symptoms including erythema, drainage, wound separation.   She reports bilateral lower extremity tenderness on palpation with edema. She states this has been present for "awhile." Advised patient about need for a doppler preop. She would not like to have this next week but the week after due to her schedule. Advised of the risks with waiting esp if a blood clot was present with the complication as severe as death. She verbalizes understanding and would still like to wait several weeks.    10 minutes spent with the patient/preparing information.  Verbalizing understanding of material discussed. No needs or concerns voiced at the end of the visit.  Advised patient to call for any needs. She has a preop phone visit arranged with Dr. Pricilla Holm before surgery as well.    This appointment is included in the global surgical bundle as pre-operative teaching and has no charge.

## 2023-06-30 NOTE — Patient Instructions (Addendum)
Preparing for your Surgery   Plan for surgery on July 27, 2023 (could change based on anesthesia availability) with Dr. Eugene Garnet at [facility]. You will be scheduled for robotic assisted total laparoscopic hysterectomy (removal of the uterus and cervix), bilateral salpingo-oophorectomy (removal of both ovaries and fallopian tubes), sentinel lymph node biopsy, possible lymph node dissection, possible laparotomy (larger incision on the abdomen if needed), mini laparotomy for specimen delivery.    Pre-operative Testing -You will receive a phone call from presurgical testing at [facility] to arrange for a pre-operative appointment and lab work.   -Bring your insurance card, copy of an advanced directive if applicable, medication list   -At that visit, you will be asked to sign a consent for a possible blood transfusion in case a transfusion becomes necessary during surgery.  The need for a blood transfusion is rare but having consent is a necessary part of your care.      -You are fine to keep taking your baby aspirin (81mg ) with your last dose being the day before surgery.   -You will need to stop taking your supplements now. Do not take supplements such as fish oil (omega 3), red yeast rice, turmeric before your surgery. You want to avoid medications with aspirin in them including headache powders such as BC or Goody's), Excedrin migraine.   Day Before Surgery at Home -You will be asked to take in a light diet the day before surgery. You will be advised you can have clear liquids up until 3 hours before your surgery.     Eat a light diet the day before surgery.  Examples including soups, broths, toast, yogurt, mashed potatoes.  AVOID GAS PRODUCING FOODS. Things to avoid include carbonated beverages (fizzy beverages, sodas), raw fruits and raw vegetables (uncooked), or beans.    If your bowels are filled with gas, your surgeon will have difficulty visualizing your pelvic  organs which increases your surgical risks.   Your role in recovery Your role is to become active as soon as directed by your doctor, while still giving yourself time to heal.  Rest when you feel tired. You will be asked to do the following in order to speed your recovery:   - Cough and breathe deeply. This helps to clear and expand your lungs and can prevent pneumonia after surgery.  - STAY ACTIVE WHEN YOU GET HOME. Do mild physical activity. Walking or moving your legs help your circulation and body functions return to normal. Do not try to get up or walk alone the first time after surgery.   -If you develop swelling on one leg or the other, pain in the back of your leg, redness/warmth in one of your legs, please call the office or go to the Emergency Room to have a doppler to rule out a blood clot. For shortness of breath, chest pain-seek care in the Emergency Room as soon as possible. - Actively manage your pain. Managing your pain lets you move in comfort. We will ask you to rate your pain on a scale of zero to 10. It is your responsibility to tell your doctor or nurse where and how much you hurt so your pain can be treated.   Special Considerations -If you are diabetic, you may be placed on insulin after surgery to have closer control over your blood sugars to promote healing and recovery.  This does not mean that you will be discharged on insulin.  If applicable, your oral  antidiabetics will be resumed when you are tolerating a solid diet.   -Your final pathology results from surgery should be available around one week after surgery and the results will be relayed to you when available.   -Dr. Antionette Char is the surgeon that assists your GYN Oncologist with surgery.  If you end up staying the night, the next day after your surgery you will either see Dr. Andrey Farmer, Dr. Pricilla Holm, or Dr. Antionette Char.   -FMLA forms can be faxed to [phone] and please allow 5-7 business days for  completion.   Pain Management After Surgery -You can continue use of methadone after surgery as well.    -Make sure that you have Tylenol and Ibuprofen at home to use on a regular basis after surgery for pain control IF YOU ARE ABLE TO TAKE THESE MEDICATIONS. We recommend alternating the medications every hour to six hours since they work differently and are processed in the body differently for pain relief.   -Review the attached handout on narcotic use and their risks and side effects.    Bowel Regimen -You will be prescribed Sennakot-S to take nightly to prevent constipation especially if you are taking the narcotic pain medication intermittently.  It is important to prevent constipation and drink adequate amounts of liquids.    Risks of Surgery Risks of surgery are low but include bleeding, infection, damage to surrounding structures, re-operation, blood clots, and very rarely death.     Blood Transfusion Information (For the consent to be signed before surgery)   We will be checking your blood type before surgery so in case of emergencies, we will know what type of blood you would need.                                             WHAT IS A BLOOD TRANSFUSION?   A transfusion is the replacement of blood or some of its parts. Blood is made up of multiple cells which provide different functions. Red blood cells carry oxygen and are used for blood loss replacement. White blood cells fight against infection. Platelets control bleeding. Plasma helps clot blood. Other blood products are available for specialized needs, such as hemophilia or other clotting disorders. BEFORE THE TRANSFUSION  Who gives blood for transfusions?  You may be able to donate blood to be used at a later date on yourself (autologous donation). Relatives can be asked to donate blood. This is generally not any safer than if you have received blood from a stranger. The same precautions are taken to ensure safety when a  relative's blood is donated. Healthy volunteers who are fully evaluated to make sure their blood is safe. This is blood bank blood. Transfusion therapy is the safest it has ever been in the practice of medicine. Before blood is taken from a donor, a complete history is taken to make sure that person has no history of diseases nor engages in risky social behavior (examples are intravenous drug use or sexual activity with multiple partners). The donor's travel history is screened to minimize risk of transmitting infections, such as malaria. The donated blood is tested for signs of infectious diseases, such as HIV and hepatitis. The blood is then tested to be sure it is compatible with you in order to minimize the chance of a transfusion reaction. If you or a relative donates blood, this  is often done in anticipation of surgery and is not appropriate for emergency situations. It takes many days to process the donated blood. RISKS AND COMPLICATIONS Although transfusion therapy is very safe and saves many lives, the main dangers of transfusion include:  Getting an infectious disease. Developing a transfusion reaction. This is an allergic reaction to something in the blood you were given. Every precaution is taken to prevent this. The decision to have a blood transfusion has been considered carefully by your caregiver before blood is given. Blood is not given unless the benefits outweigh the risks.   AFTER SURGERY INSTRUCTIONS   Return to work: 4-6 weeks if applicable   You may have a white honeycomb dressing over your larger incision. This dressing can be removed 5 days after surgery and you do not need to reapply a new dressing. Once you remove the dressing, you will notice that you have the surgical glue (dermabond) on the incision and this will peel off on its own. You can get this dressing wet in the shower the days after surgery prior to removal on the 5th day.    Activity: 1. Be up and out of the  bed during the day.  Take a nap if needed.  You may walk up steps but be careful and use the hand rail.  Stair climbing will tire you more than you think, you may need to stop part way and rest.    2. No lifting or straining for 6 weeks over 10 pounds. No pushing, pulling, straining for 6 weeks.   3. No driving for 1 week(s).  Do not drive if you are taking narcotic pain medicine and make sure that your reaction time has returned.    4. You can shower as soon as the next day after surgery. Shower daily.  Use your regular soap and water (not directly on the incision) and pat your incision(s) dry afterwards; don't rub.  No tub baths or submerging your body in water until cleared by your surgeon. If you have the soap that was given to you by pre-surgical testing that was used before surgery, you do not need to use it afterwards because this can irritate your incisions.    5. No sexual activity and nothing in the vagina for 8-10 weeks.   6. You may experience a small amount of clear drainage from your incisions, which is normal.  If the drainage persists, increases, or changes color please call the office.   7. Do not use creams, lotions, or ointments such as neosporin on your incisions after surgery until advised by your surgeon because they can cause removal of the dermabond glue on your incisions.     8. You may experience vaginal spotting after surgery or when the stitches at the top of the vagina begin to dissolve.  The spotting is normal but if you experience heavy bleeding, call our office.   9. Take Tylenol or ibuprofen first for pain and continue pain medication regimen with methadone.  Monitor your Tylenol intake to a max of 4,000 mg in a 24 hour period. You can alternate these medications after surgery.   Diet: 1. Low sodium Heart Healthy Diet is recommended but you are cleared to resume your normal (before surgery) diet after your procedure.   2. It is safe to use a laxative, such as  Miralax or Colace, if you have difficulty moving your bowels. You have been prescribed Sennakot at bedtime every evening to keep bowel movements  regular and to prevent constipation.     Wound Care: 1. Keep clean and dry.  Shower daily.   Reasons to call the Doctor: Fever - Oral temperature greater than 100.4 degrees Fahrenheit Foul-smelling vaginal discharge Difficulty urinating Nausea and vomiting Increased pain at the site of the incision that is unrelieved with pain medicine. Difficulty breathing with or without chest pain New calf pain especially if only on one side Sudden, continuing increased vaginal bleeding with or without clots.   Contacts: For questions or concerns you should contact:   Dr. Eugene Garnet at [phone]   Warner Mccreedy, NP at [phone]   After Hours: call [phone] and have the GYN Oncologist paged/contacted (after 5 pm or on the weekends).   Messages sent via mychart are for non-urgent matters and are not responded to after hours so for urgent needs, please call the after hours number.

## 2023-07-01 ENCOUNTER — Encounter (HOSPITAL_COMMUNITY): Admission: RE | Admit: 2023-07-01 | Payer: Medicaid Other | Source: Ambulatory Visit

## 2023-07-01 DIAGNOSIS — I5020 Unspecified systolic (congestive) heart failure: Secondary | ICD-10-CM | POA: Diagnosis not present

## 2023-07-02 DIAGNOSIS — I5020 Unspecified systolic (congestive) heart failure: Secondary | ICD-10-CM | POA: Diagnosis not present

## 2023-07-03 DIAGNOSIS — I5020 Unspecified systolic (congestive) heart failure: Secondary | ICD-10-CM | POA: Diagnosis not present

## 2023-07-04 DIAGNOSIS — I5020 Unspecified systolic (congestive) heart failure: Secondary | ICD-10-CM | POA: Diagnosis not present
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
## 2023-07-05 ENCOUNTER — Inpatient Hospital Stay: Payer: Medicaid Other | Admitting: Nurse Practitioner

## 2023-07-05 ENCOUNTER — Inpatient Hospital Stay: Payer: Medicaid Other | Admitting: Hematology and Oncology

## 2023-07-05 ENCOUNTER — Ambulatory Visit: Payer: Medicaid Other | Admitting: Family Medicine

## 2023-07-05 DIAGNOSIS — I5020 Unspecified systolic (congestive) heart failure: Secondary | ICD-10-CM | POA: Diagnosis not present

## 2023-07-05 NOTE — Progress Notes (Deleted)
   Patient Name: [name] Date of Birth: 01/22/1961 Date of Visit: 07/05/23 PCP: Darral Dash, DO  SUBJECTIVE:   Chief Complaint: Preoperative Evaluation Surgeon: Dr. Pricilla Holm, Gyn-Onc Surgery: Total laparoscopic hysterectomy, bilateral salpingo-oophorectomy, and sentinel lymph node biopsy Date of Procedure: July 27, 2023  [name] is a pleasant 62 y.o. with medical history significant for STEMI in 2018, CAD, CHF, obesity, and complex atypical endometrial hyperplasia presenting today for follow up on lower extremity edema*** and for preoperative clearance.  The patient can walk *** city blocks and or up 2 flights of stairs without difficulty.  Prior surgeries: ***  Review of systems: Chest pain with exertion? {yes/no:20286} Dyspnea on exertion? {yes/no:20286} Can you walk 2 blocks without dyspnea or chest pain? {yes/no:20286} Can you walk up a flight of stairs without chest pain or dyspnea? {yes/no:20286} Any history of easy bruising or bleeding? {yes/no:20286} Any family history of bleeding diathesis? {yes/no:20286} Any personal or family of difficulty with anesthesia? {yes/no:20286} Any history of sleep apnea? {yes/no:20286}  Medical History: Cardiac conditions? CAD, diastolic heart failure, HTN Last TEE 07/05/2022: G2DD, EF 55-60% History of VTE? STEMI in 2018 History of adverse surgical outcome? ***  Diabetes? No Obesity? Yes OSA? ***   I have reviewed the patient's medical, surgical, family, and social history as appropriate.   OBJECTIVE:   LMP 08/07/2011  There were no vitals filed for this visit.  General: Age-appropriate, resting comfortably in chair, NAD, alert and at baseline. HEENT:  Head: Normocephalic, atraumatic. No tenderness to percussion over sinuses. Eyes: PERRLA. No conjunctival erythema or scleral injections. Ears: TMs non-bulging and non-erythematous bilaterally. No erythema of external ear canal. No cerumen impaction. Nose:  Non-erythematous turbinates. No rhinorrhea. Mouth/Oral: Clear, no tonsillar exudate. MMM. Neck: Supple. No LAD. Cardiovascular: Regular rate and rhythm. Normal S1/S2. No murmurs, rubs, or gallops appreciated. 2+ radial pulses. Pulmonary: Clear bilaterally to ascultation. No increased WOB, no accessory muscle usage. No wheezes, crackles, or rhonchi. Abdominal: No tenderness to deep or light palpation. No rebound or guarding. No HSM. Skin: Warm and dry. Extremities: No peripheral edema bilaterally. Capillary refill <2 seconds.  EKG:   No results found. However, due to the size of the patient record, not all encounters were searched. Please check Results Review for a complete set of results.  ASSESSMENT/PLAN:   Patient seen by Carlos Levering, NP on 06/22/23 for preop cardiovascular clearance and cleared as acceptable risk with RCRI 6.6% risk of MACE and activity of 4.95 METS (per DASI). Defer to cardiology The patient has been medically optimized for this procedure.  In addition, I make the following recommendations for further evaluation and medication adjustments prior to their  planned procedure: EKG today ***  Problem List Items Addressed This Visit   None  No follow-ups on file.   Sharion Dove, MD Family Medicine Teaching Service

## 2023-07-06 ENCOUNTER — Ambulatory Visit: Admit: 2023-07-06 | Payer: Medicaid Other | Admitting: Gynecologic Oncology

## 2023-07-06 ENCOUNTER — Inpatient Hospital Stay (HOSPITAL_BASED_OUTPATIENT_CLINIC_OR_DEPARTMENT_OTHER): Payer: Medicaid Other | Admitting: Gynecologic Oncology

## 2023-07-06 ENCOUNTER — Encounter: Payer: Self-pay | Admitting: Gynecologic Oncology

## 2023-07-06 DIAGNOSIS — Z7189 Other specified counseling: Secondary | ICD-10-CM

## 2023-07-06 DIAGNOSIS — N8502 Endometrial intraepithelial neoplasia [EIN]: Secondary | ICD-10-CM

## 2023-07-06 DIAGNOSIS — I5020 Unspecified systolic (congestive) heart failure: Secondary | ICD-10-CM | POA: Diagnosis not present

## 2023-07-06 DIAGNOSIS — E669 Obesity, unspecified: Secondary | ICD-10-CM | POA: Diagnosis not present

## 2023-07-06 SURGERY — HYSTERECTOMY, TOTAL, ROBOT-ASSISTED, LAPAROSCOPIC, WITH BILATERAL SALPINGO-OOPHORECTOMY
Anesthesia: General
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
## 2023-07-07 DIAGNOSIS — N8502 Endometrial intraepithelial neoplasia [EIN]: Secondary | ICD-10-CM

## 2023-07-07 DIAGNOSIS — I5020 Unspecified systolic (congestive) heart failure: Secondary | ICD-10-CM | POA: Diagnosis not present

## 2023-07-08 DIAGNOSIS — I5020 Unspecified systolic (congestive) heart failure: Secondary | ICD-10-CM | POA: Diagnosis not present

## 2023-07-09 DIAGNOSIS — I5020 Unspecified systolic (congestive) heart failure: Secondary | ICD-10-CM | POA: Diagnosis not present

## 2023-07-10 DIAGNOSIS — I5020 Unspecified systolic (congestive) heart failure: Secondary | ICD-10-CM | POA: Diagnosis not present

## 2023-07-11 ENCOUNTER — Ambulatory Visit (HOSPITAL_COMMUNITY): Payer: Medicaid Other

## 2023-07-11 DIAGNOSIS — I5020 Unspecified systolic (congestive) heart failure: Secondary | ICD-10-CM | POA: Diagnosis not present

## 2023-07-12 DIAGNOSIS — I5020 Unspecified systolic (congestive) heart failure: Secondary | ICD-10-CM | POA: Diagnosis not present

## 2023-07-13 DIAGNOSIS — I5020 Unspecified systolic (congestive) heart failure: Secondary | ICD-10-CM | POA: Diagnosis not present

## 2023-07-14 DIAGNOSIS — I5020 Unspecified systolic (congestive) heart failure: Secondary | ICD-10-CM | POA: Diagnosis not present

## 2023-07-15 DIAGNOSIS — I5020 Unspecified systolic (congestive) heart failure: Secondary | ICD-10-CM | POA: Diagnosis not present

## 2023-07-16 DIAGNOSIS — I5020 Unspecified systolic (congestive) heart failure: Secondary | ICD-10-CM | POA: Diagnosis not present

## 2023-07-17 DIAGNOSIS — I5020 Unspecified systolic (congestive) heart failure: Secondary | ICD-10-CM | POA: Diagnosis not present

## 2023-07-18 DIAGNOSIS — I5020 Unspecified systolic (congestive) heart failure: Secondary | ICD-10-CM | POA: Diagnosis not present

## 2023-07-19 DIAGNOSIS — I5020 Unspecified systolic (congestive) heart failure: Secondary | ICD-10-CM | POA: Diagnosis not present

## 2023-07-20 DIAGNOSIS — I5020 Unspecified systolic (congestive) heart failure: Secondary | ICD-10-CM | POA: Diagnosis not present

## 2023-07-21 ENCOUNTER — Telehealth: Payer: Self-pay | Admitting: *Deleted

## 2023-07-21 DIAGNOSIS — I5020 Unspecified systolic (congestive) heart failure: Secondary | ICD-10-CM | POA: Diagnosis not present

## 2023-07-21 NOTE — Patient Instructions (Signed)
SURGICAL WAITING ROOM VISITATION  Patients having surgery or a procedure may have no more than 2 support people in the waiting area - these visitors may rotate.    Children under the age of 75 must have an adult with them who is not the patient.  Due to an increase in RSV and influenza rates and associated hospitalizations, children ages 62 and under may not visit patients in [facility] hospitals.  If the patient needs to stay at the hospital during part of their recovery, the visitor guidelines for inpatient rooms apply. Pre-op nurse will coordinate an appropriate time for 1 support person to accompany patient in pre-op.  This support person may not rotate.    Please refer to the [name] website for the visitor guidelines for Inpatients (after your surgery is over and you are in a regular room).    Your procedure is scheduled on: 07/27/23   Report to [facility] Main Entrance    Report to admitting at 9:45 AM   Call this number if you have problems the morning of surgery [phone]   Do not eat food :After Midnight.   After Midnight you may have the following liquids until 9:00 AM DAY OF SURGERY  Water Non-Citrus Juices (without pulp, NO RED-Apple, White grape, White cranberry) Black Coffee (NO MILK/CREAM OR CREAMERS, sugar ok)  Clear Tea (NO MILK/CREAM OR CREAMERS, sugar ok) regular and decaf                             Plain Jell-O (NO RED)                                           Fruit ices (not with fruit pulp, NO RED)                                     Popsicles (NO RED)                                                               Sports drinks like Gatorade (NO RED)                      If you have questions, please contact your surgeon's office.   FOLLOW BOWEL PREP AND ANY ADDITIONAL PRE OP INSTRUCTIONS YOU RECEIVED FROM YOUR SURGEON'S OFFICE!!!     Oral Hygiene is also important to reduce your risk of infection.                                     Remember - BRUSH YOUR TEETH THE MORNING OF SURGERY WITH YOUR REGULAR TOOTHPASTE  DENTURES WILL BE REMOVED PRIOR TO SURGERY PLEASE DO NOT APPLY "Poly grip" OR ADHESIVES!!!   Do NOT smoke after Midnight   Stop all vitamins and herbal supplements 7 days before surgery.   Hold Jardiance for 3 days. Last dose 07/23/23.   Take these medicines the morning of surgery with A SIP  OF WATER: Tylenol, Allopurinol, Carvedilol, Zetia, Flonase, Methadone  DO NOT TAKE ANY ORAL DIABETIC MEDICATIONS DAY OF YOUR SURGERY  Bring CPAP mask and tubing day of surgery.                              You may not have any metal on your body including hair pins, jewelry, and body piercing             Do not wear make-up, lotions, powders, perfumes, or deodorant  Do not wear nail polish including gel and S&S, artificial/acrylic nails, or any other type of covering on natural nails including finger and toenails. If you have artificial nails, gel coating, etc. that needs to be removed by a nail salon please have this removed prior to surgery or surgery may need to be canceled/ delayed if the surgeon/ anesthesia feels like they are unable to be safely monitored.   Do not shave  48 hours prior to surgery.    Do not bring valuables to the hospital. [facility] IS NOT             RESPONSIBLE   FOR VALUABLES.   Contacts, glasses, dentures or bridgework may not be worn into surgery.  DO NOT BRING YOUR HOME MEDICATIONS TO THE HOSPITAL. PHARMACY WILL DISPENSE MEDICATIONS LISTED ON YOUR MEDICATION LIST TO YOU DURING YOUR ADMISSION IN THE HOSPITAL!    Patients discharged on the day of surgery will not be allowed to drive home.  Someone NEEDS to stay with you for the first 24 hours after anesthesia.   Special Instructions: Bring a copy of your healthcare power of attorney and living will documents the day of surgery if you haven't scanned them before.              Please read over the following fact sheets you were given: IF  YOU HAVE QUESTIONS ABOUT YOUR PRE-OP INSTRUCTIONS PLEASE CALL [name]   If you received a COVID test during your pre-op visit  it is requested that you wear a mask when out in public, stay away from anyone that may not be feeling well and notify your surgeon if you develop symptoms. If you test positive for Covid or have been in contact with anyone that has tested positive in the last 10 days please notify you surgeon.    [facility] - Preparing for Surgery Before surgery, you can play an important role.  Because skin is not sterile, your skin needs to be as free of germs as possible.  You can reduce the number of germs on your skin by washing with CHG (chlorahexidine gluconate) soap before surgery.  CHG is an antiseptic cleaner which kills germs and bonds with the skin to continue killing germs even after washing. Please DO NOT use if you have an allergy to CHG or antibacterial soaps.  If your skin becomes reddened/irritated stop using the CHG and inform your nurse when you arrive at Short Stay. Do not shave (including legs and underarms) for at least 48 hours prior to the first CHG shower.  You may shave your face/neck.  Please follow these instructions carefully:  1.  Shower with CHG Soap the night before surgery and the  morning of surgery.  2.  If you choose to wash your hair, wash your hair first as usual with your normal  shampoo.  3.  After you shampoo, rinse your hair and body thoroughly to  remove the shampoo.                             4.  Use CHG as you would any other liquid soap.  You can apply chg directly to the skin and wash.  Gently with a scrungie or clean washcloth.  5.  Apply the CHG Soap to your body ONLY FROM THE NECK DOWN.   Do   not use on face/ open                           Wound or open sores. Avoid contact with eyes, ears mouth and   genitals (private parts).                       Wash face,  Genitals (private parts) with your normal soap.             6.   Wash thoroughly, paying special attention to the area where your    surgery  will be performed.  7.  Thoroughly rinse your body with warm water from the neck down.  8.  DO NOT shower/wash with your normal soap after using and rinsing off the CHG Soap.                9.  Pat yourself dry with a clean towel.            10.  Wear clean pajamas.            11.  Place clean sheets on your bed the night of your first shower and do not  sleep with pets. Day of Surgery : Do not apply any lotions/deodorants the morning of surgery.  Please wear clean clothes to the hospital/surgery center.  FAILURE TO FOLLOW THESE INSTRUCTIONS MAY RESULT IN THE CANCELLATION OF YOUR SURGERY  PATIENT SIGNATURE_________________________________  NURSE SIGNATURE__________________________________  ________________________________________________________________________ WHAT IS A BLOOD TRANSFUSION? Blood Transfusion Information  A transfusion is the replacement of blood or some of its parts. Blood is made up of multiple cells which provide different functions. Red blood cells carry oxygen and are used for blood loss replacement. White blood cells fight against infection. Platelets control bleeding. Plasma helps clot blood. Other blood products are available for specialized needs, such as hemophilia or other clotting disorders. BEFORE THE TRANSFUSION  Who gives blood for transfusions?  Healthy volunteers who are fully evaluated to make sure their blood is safe. This is blood bank blood. Transfusion therapy is the safest it has ever been in the practice of medicine. Before blood is taken from a donor, a complete history is taken to make sure that person has no history of diseases nor engages in risky social behavior (examples are intravenous drug use or sexual activity with multiple partners). The donor's travel history is screened to minimize risk of transmitting infections, such as malaria. The donated blood is tested for  signs of infectious diseases, such as HIV and hepatitis. The blood is then tested to be sure it is compatible with you in order to minimize the chance of a transfusion reaction. If you or a relative donates blood, this is often done in anticipation of surgery and is not appropriate for emergency situations. It takes many days to process the donated blood. RISKS AND COMPLICATIONS Although transfusion therapy is very safe and saves many lives, the main dangers of transfusion include:  Getting  an infectious disease. Developing a transfusion reaction. This is an allergic reaction to something in the blood you were given. Every precaution is taken to prevent this. The decision to have a blood transfusion has been considered carefully by your caregiver before blood is given. Blood is not given unless the benefits outweigh the risks. AFTER THE TRANSFUSION Right after receiving a blood transfusion, you will usually feel much better and more energetic. This is especially true if your red blood cells have gotten low (anemic). The transfusion raises the level of the red blood cells which carry oxygen, and this usually causes an energy increase. The nurse administering the transfusion will monitor you carefully for complications. HOME CARE INSTRUCTIONS  No special instructions are needed after a transfusion. You may find your energy is better. Speak with your caregiver about any limitations on activity for underlying diseases you may have. SEEK MEDICAL CARE IF:  Your condition is not improving after your transfusion. You develop redness or irritation at the intravenous (IV) site. SEEK IMMEDIATE MEDICAL CARE IF:  Any of the following symptoms occur over the next 12 hours: Shaking chills. You have a temperature by mouth above 102 F (38.9 C), not controlled by medicine. Chest, back, or muscle pain. People around you feel you are not acting correctly or are confused. Shortness of breath or difficulty  breathing. Dizziness and fainting. You get a rash or develop hives. You have a decrease in urine output. Your urine turns a dark color or changes to pink, red, or brown. Any of the following symptoms occur over the next 10 days: You have a temperature by mouth above 102 F (38.9 C), not controlled by medicine. Shortness of breath. Weakness after normal activity. The white part of the eye turns yellow (jaundice). You have a decrease in the amount of urine or are urinating less often. Your urine turns a dark color or changes to pink, red, or brown. Document Released: 12/03/2000 Document Revised: 02/28/2012 Document Reviewed: 07/22/2008 [facility] Patient Information 2014 [place], [place].  _______________________________________________________________________

## 2023-07-21 NOTE — Telephone Encounter (Signed)
Spoke with [name] who originally stated she has car problems and may not be able to make it to her pre-admission testing appt. At [facility] tomorrow for 1pm and will call the office back.   Pt returned call and has some concerns that she wants to see her cardiologist in person before having her surgery on August 7th. Pt states I have been having some heart palpitations and pain on left side of chest, pt denies SOB and advised pt to go to urgent care and she said, "Oh, it's nothing like that"  pt states, "I don't want Dr. Pricilla Holm to be upset or mad because I am ready to have my surgery this time, it's real  I just want to reschedule it"  Pt states she canceled her appointment for an ultrasound that was ordered by her PCP for "knots in her legs" and pt states I want to reschedule that appointment as well. Pt advised to call her cardiologist for an appointment, and her message would be relayed to providers.   Pre-admission testing appointment canceled for Friday, August 2nd per patient request.

## 2023-07-21 NOTE — Telephone Encounter (Signed)
Call placed to [name] to check in with patient in regards to her scheduled surgery with Dr. Pricilla Holm on August 7 th. Left message requesting call back to [phone].

## 2023-07-21 NOTE — Progress Notes (Signed)
COVID Vaccine Completed: yes  Date of COVID positive in last 90 days:  PCP - Darral Dash, MD Cardiologist - Verdis Prime, MD  Cardiac clearance by Carlos Levering 06/22/23 in Epic   Chest x-ray - 02/14/23 Epic EKG - 02/14/23 Epic Stress Test - 07/08/22 Epic ECHO - 07/05/22 Epic Cardiac Cath -  Pacemaker/ICD device last checked: Spinal Cord Stimulator:  Bowel Prep -   Sleep Study -  CPAP -   Fasting Blood Sugar - preDM Checks Blood Sugar _____ times a day  Last dose of GLP1 agonist-  N/A GLP1 instructions:  N/A   Last dose of SGLT-2 inhibitors-  N/A SGLT-2 instructions: N/A   Blood Thinner Instructions:  Time Aspirin Instructions: ASA 81, hold 5-7 days Last Dose:  Activity level:  Can go up a flight of stairs and perform activities of daily living without stopping and without symptoms of chest pain or shortness of breath.  Able to exercise without symptoms  Unable to go up a flight of stairs without symptoms of     Anesthesia review: atherosclerosis of aorta, HTN, CAD, CH, STEMI, SOB  Patient denies shortness of breath, fever, cough and chest pain at PAT appointment  Patient verbalized understanding of instructions that were given to them at the PAT appointment. Patient was also instructed that they will need to review over the PAT instructions again at home before surgery.

## 2023-07-22 ENCOUNTER — Encounter: Payer: Self-pay | Admitting: Gynecologic Oncology

## 2023-07-22 ENCOUNTER — Encounter (HOSPITAL_COMMUNITY)
Admission: RE | Admit: 2023-07-22 | Discharge: 2023-07-22 | Disposition: A | Discharge status: Home / Self Care | Payer: Medicaid Other | Source: Ambulatory Visit | Attending: Anesthesiology | Admitting: Anesthesiology

## 2023-07-22 ENCOUNTER — Telehealth: Payer: Self-pay | Admitting: Nurse Practitioner

## 2023-07-22 ENCOUNTER — Telehealth: Payer: Self-pay | Admitting: *Deleted

## 2023-07-22 ENCOUNTER — Other Ambulatory Visit (HOSPITAL_COMMUNITY): Payer: Self-pay | Admitting: Emergency Medicine

## 2023-07-22 ENCOUNTER — Other Ambulatory Visit (HOSPITAL_COMMUNITY): Payer: Self-pay

## 2023-07-22 ENCOUNTER — Other Ambulatory Visit: Payer: Self-pay | Admitting: Nurse Practitioner

## 2023-07-22 DIAGNOSIS — Z515 Encounter for palliative care: Secondary | ICD-10-CM

## 2023-07-22 DIAGNOSIS — C50412 Malignant neoplasm of upper-outer quadrant of left female breast: Secondary | ICD-10-CM

## 2023-07-22 DIAGNOSIS — G893 Neoplasm related pain (acute) (chronic): Secondary | ICD-10-CM

## 2023-07-22 DIAGNOSIS — I5020 Unspecified systolic (congestive) heart failure: Secondary | ICD-10-CM | POA: Diagnosis not present

## 2023-07-22 MED ORDER — METHADONE HCL 5 MG PO TABS
15.0000 mg | ORAL_TABLET | Freq: Three times a day (TID) | ORAL | 0 refills | Status: DC
Start: 2023-07-23 — End: 2023-08-15
  Filled 2023-07-23: qty 270, 30d supply, fill #0

## 2023-07-22 NOTE — Telephone Encounter (Signed)
Pt called for med refill

## 2023-07-22 NOTE — Telephone Encounter (Signed)
Spoke with patient concerning symptoms. Patient complains of shortness of breath upon ambulating longer distances for the past several weeks per patient. Denies SOB at rest or basic ADL's, chest pain, n/v, or dizziness. Patient supposed to have endometrial surgery on 07/27/23 but states would feel more comfortable to reschedule surgery until can be seen by cardiology prior to for the peace of mind. Scheduled appointment on 08/05/23 with Inda Coke, NP. Patient thankful for the quick appointment and pleased to have her surgery rescheduled until after this time. No further needs at this time.

## 2023-07-22 NOTE — Telephone Encounter (Signed)
Patient called to state she has an in person appointment with her cardiologist for Friday, August 16 th at 0815.

## 2023-07-22 NOTE — Telephone Encounter (Signed)
Pt c/o Shortness Of Breath: STAT if SOB developed within the last 24 hours or pt is noticeably SOB on the phone  1. Are you currently SOB (can you hear that pt is SOB on the phone)? No  2. How long have you been experiencing SOB? About a week or 2  3. Are you SOB when sitting or when up moving around? Walking around  4. Are you currently experiencing any other symptoms? Not right now but the other day she had a little pinch so she'd like to speak with a nurse about an inpatient exam due to this and her having a surgery coming up. Please advise

## 2023-07-23 ENCOUNTER — Other Ambulatory Visit (HOSPITAL_COMMUNITY): Payer: Self-pay

## 2023-07-23 DIAGNOSIS — I5020 Unspecified systolic (congestive) heart failure: Secondary | ICD-10-CM | POA: Diagnosis not present

## 2023-07-24 DIAGNOSIS — I5020 Unspecified systolic (congestive) heart failure: Secondary | ICD-10-CM | POA: Diagnosis not present

## 2023-07-25 ENCOUNTER — Other Ambulatory Visit (HOSPITAL_COMMUNITY): Payer: Self-pay

## 2023-07-25 ENCOUNTER — Other Ambulatory Visit: Payer: Self-pay | Admitting: Student

## 2023-07-25 DIAGNOSIS — I5020 Unspecified systolic (congestive) heart failure: Secondary | ICD-10-CM | POA: Diagnosis not present

## 2023-07-25 MED ORDER — FUROSEMIDE 20 MG PO TABS
40.0000 mg | ORAL_TABLET | Freq: Every day | ORAL | 0 refills | Status: DC
  Filled 2023-07-25: qty 60, 30d supply, fill #0

## 2023-07-26 DIAGNOSIS — I5020 Unspecified systolic (congestive) heart failure: Secondary | ICD-10-CM | POA: Diagnosis not present

## 2023-07-27 ENCOUNTER — Ambulatory Visit (HOSPITAL_COMMUNITY): Admission: RE | Admit: 2023-07-27 | Payer: Medicaid Other | Source: Home / Self Care | Admitting: Gynecologic Oncology

## 2023-07-27 ENCOUNTER — Encounter (HOSPITAL_COMMUNITY): Admission: RE | Payer: Self-pay | Source: Home / Self Care

## 2023-07-27 DIAGNOSIS — N8502 Endometrial intraepithelial neoplasia [EIN]: Secondary | ICD-10-CM

## 2023-07-27 DIAGNOSIS — I5020 Unspecified systolic (congestive) heart failure: Secondary | ICD-10-CM | POA: Diagnosis not present

## 2023-07-27 SURGERY — HYSTERECTOMY, TOTAL, ROBOT-ASSISTED, LAPAROSCOPIC, WITH BILATERAL SALPINGO-OOPHORECTOMY
Anesthesia: General

## 2023-07-28 DIAGNOSIS — I5020 Unspecified systolic (congestive) heart failure: Secondary | ICD-10-CM | POA: Diagnosis not present

## 2023-07-29 DIAGNOSIS — I5020 Unspecified systolic (congestive) heart failure: Secondary | ICD-10-CM | POA: Diagnosis not present

## 2023-07-30 DIAGNOSIS — I5020 Unspecified systolic (congestive) heart failure: Secondary | ICD-10-CM | POA: Diagnosis not present

## 2023-07-31 DIAGNOSIS — I5020 Unspecified systolic (congestive) heart failure: Secondary | ICD-10-CM | POA: Diagnosis not present

## 2023-08-01 DIAGNOSIS — I5020 Unspecified systolic (congestive) heart failure: Secondary | ICD-10-CM | POA: Diagnosis not present

## 2023-08-02 DIAGNOSIS — I5020 Unspecified systolic (congestive) heart failure: Secondary | ICD-10-CM | POA: Diagnosis not present

## 2023-08-03 ENCOUNTER — Telehealth: Payer: Medicaid Other | Admitting: Gynecologic Oncology

## 2023-08-03 DIAGNOSIS — I5020 Unspecified systolic (congestive) heart failure: Secondary | ICD-10-CM | POA: Diagnosis not present

## 2023-08-04 DIAGNOSIS — I5020 Unspecified systolic (congestive) heart failure: Secondary | ICD-10-CM | POA: Diagnosis not present
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
## 2023-08-05 ENCOUNTER — Encounter: Payer: Self-pay | Admitting: Nurse Practitioner

## 2023-08-05 ENCOUNTER — Ambulatory Visit: Payer: Medicaid Other | Attending: Nurse Practitioner | Admitting: Nurse Practitioner

## 2023-08-05 DIAGNOSIS — I2510 Atherosclerotic heart disease of native coronary artery without angina pectoris: Secondary | ICD-10-CM

## 2023-08-05 DIAGNOSIS — R0602 Shortness of breath: Secondary | ICD-10-CM

## 2023-08-05 DIAGNOSIS — I10 Essential (primary) hypertension: Secondary | ICD-10-CM

## 2023-08-05 DIAGNOSIS — I5020 Unspecified systolic (congestive) heart failure: Secondary | ICD-10-CM | POA: Diagnosis not present

## 2023-08-05 DIAGNOSIS — E785 Hyperlipidemia, unspecified: Secondary | ICD-10-CM

## 2023-08-05 DIAGNOSIS — I509 Heart failure, unspecified: Secondary | ICD-10-CM

## 2023-08-06 DIAGNOSIS — I5020 Unspecified systolic (congestive) heart failure: Secondary | ICD-10-CM | POA: Diagnosis not present

## 2023-08-07 DIAGNOSIS — I5020 Unspecified systolic (congestive) heart failure: Secondary | ICD-10-CM | POA: Diagnosis not present

## 2023-08-08 ENCOUNTER — Encounter: Payer: Self-pay | Admitting: Student

## 2023-08-08 ENCOUNTER — Other Ambulatory Visit (HOSPITAL_COMMUNITY): Payer: Self-pay

## 2023-08-08 ENCOUNTER — Ambulatory Visit (INDEPENDENT_AMBULATORY_CARE_PROVIDER_SITE_OTHER): Payer: Medicaid Other | Admitting: Student

## 2023-08-08 VITALS — BP 130/90 | HR 79 | Ht 65.0 in | Wt 207.0 lb

## 2023-08-08 DIAGNOSIS — R296 Repeated falls: Secondary | ICD-10-CM

## 2023-08-08 DIAGNOSIS — R7303 Prediabetes: Secondary | ICD-10-CM | POA: Diagnosis not present

## 2023-08-08 DIAGNOSIS — R0602 Shortness of breath: Secondary | ICD-10-CM | POA: Diagnosis not present

## 2023-08-08 DIAGNOSIS — I872 Venous insufficiency (chronic) (peripheral): Secondary | ICD-10-CM | POA: Insufficient documentation

## 2023-08-08 DIAGNOSIS — E876 Hypokalemia: Secondary | ICD-10-CM

## 2023-08-08 DIAGNOSIS — I5020 Unspecified systolic (congestive) heart failure: Secondary | ICD-10-CM | POA: Diagnosis not present

## 2023-08-08 MED ORDER — ALLOPURINOL 300 MG PO TABS
300.0000 mg | ORAL_TABLET | Freq: Every day | ORAL | 2 refills | Status: AC
  Filled 2023-08-08 – 2023-12-05 (×3): qty 90, 90d supply, fill #0
  Filled 2024-03-26 – 2024-03-27 (×2): qty 90, 90d supply, fill #1
  Filled 2024-06-29: qty 90, 90d supply, fill #2

## 2023-08-08 MED ORDER — EMPAGLIFLOZIN 10 MG PO TABS
10.0000 mg | ORAL_TABLET | Freq: Every day | ORAL | 1 refills | Status: DC
  Filled 2023-08-08 – 2023-09-03 (×3): qty 30, 30d supply, fill #0
  Filled 2023-10-07 – 2023-10-20 (×2): qty 30, 30d supply, fill #1

## 2023-08-08 NOTE — Patient Instructions (Addendum)
It was great seeing you today.  As we discussed, -Continue taking your furosemide (Lasix) 40 mg daily.  Your swelling in your legs could be due to multiple different things (fluid overload, gravity, venous stasis dermatitis), but as we discussed it is important to wear compression stockings for 3 to 4 hours a day.  You may get stockings with zippers on [employer].  Your nurse can help put these on you.  -It is very important that you follow-up with your cardiologist at your appointment on 8/27. -I placed an order for a cane for you.  We collected labs today.  If anything is abnormal, I will call you.   If you have any questions or concerns, please feel free to call the clinic.   Have a wonderful day,  Dr. Darral Dash [facility] [phone]

## 2023-08-08 NOTE — Progress Notes (Signed)
    SUBJECTIVE:   CHIEF COMPLAINT / HPI:   [name] is a 62 year old female here to discuss shortness of breath, need for DME order.  Shortness of breath Reports intermittent shortness of breath off and on for the past year.  Follows with cardiology, has upcoming office visit on 8/27. Takes 40 mg daily of Lasix Does say that is worse with walking. Reports some swelling, soreness and spots in her legs Smokes 3 to 4 cigarettes a day.  Says she used to smoke 1 pack/day and has been trying to wean off cigarettes  History of falls Lost her cane and wants to know if I can order her a new one She has mobility issues and requires assistance with ambulation for safety   PERTINENT  PMH / PSH: Chronic CHF, chronic dyspnea  OBJECTIVE:   BP (!) 130/90   Pulse 79   Ht 5\' 5"  (1.651 m)   Wt 207 lb (93.9 kg)   LMP 08/07/2011   SpO2 97%   BMI 34.45 kg/m   General: Chronically ill-appearing, appears older than stated age CV: Regular rate and rhythm Respiratory: Normal work of breathing on room air.  Able to speak in full sentences.  Lungs are clear throughout, no crackles or wheezing Extremities: Nonpitting edema in bilateral lower extremities with chronic vascular changes consistent with venous stasis dermatitis.  No ulceration or open wounds  ASSESSMENT/PLAN:   Shortness of breath Chronic, ongoing for at least 1 year. No red flag signs on lung exam  concerning for pulmonary edema, pneumonia, obstructive lung disease, CHF exacerbation Likely worsened by her cigarette use, and highly encouraged cessation entirely.  Offered cessation resources. Has upcoming appointment in the next week with her cardiologist Likely needs repeat echocardiogram   Venous stasis dermatitis of both lower extremities Symptoms of lower extremities with hyperpigmentation, swelling consistent with venous stasis dermatitis Discussed compression stockings, to wear at least 3 to 4 hours a day with assistance  with home health nurse to put them on Also discussed home leg exercises     Darral Dash, DO [facility]

## 2023-08-08 NOTE — Assessment & Plan Note (Signed)
Symptoms of lower extremities with hyperpigmentation, swelling consistent with venous stasis dermatitis Discussed compression stockings, to wear at least 3 to 4 hours a day with assistance with home health nurse to put them on Also discussed home leg exercises
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
## 2023-08-08 NOTE — Assessment & Plan Note (Signed)
Chronic, ongoing for at least 1 year. No red flag signs on lung exam  concerning for pulmonary edema, pneumonia, obstructive lung disease, CHF exacerbation Likely worsened by her cigarette use, and highly encouraged cessation entirely.  Offered cessation resources. Has upcoming appointment in the next week with her cardiologist Likely needs repeat echocardiogram

## 2023-08-09 ENCOUNTER — Encounter: Payer: Self-pay | Admitting: Nurse Practitioner

## 2023-08-09 ENCOUNTER — Telehealth: Payer: Self-pay | Admitting: *Deleted

## 2023-08-09 ENCOUNTER — Inpatient Hospital Stay: Payer: Medicaid Other | Attending: Adult Health | Admitting: Nurse Practitioner

## 2023-08-09 DIAGNOSIS — Z515 Encounter for palliative care: Secondary | ICD-10-CM

## 2023-08-09 DIAGNOSIS — C50412 Malignant neoplasm of upper-outer quadrant of left female breast: Secondary | ICD-10-CM | POA: Diagnosis not present

## 2023-08-09 DIAGNOSIS — G893 Neoplasm related pain (acute) (chronic): Secondary | ICD-10-CM

## 2023-08-09 DIAGNOSIS — I5020 Unspecified systolic (congestive) heart failure: Secondary | ICD-10-CM | POA: Diagnosis not present

## 2023-08-09 LAB — BASIC METABOLIC PANEL
BUN/Creatinine Ratio: 11 (ref 9–23)
BUN: 11 mg/dL (ref 6–24)
CO2: 21 mmol/L (ref 20–29)
Calcium: 9.7 mg/dL (ref 8.7–10.2)
Chloride: 104 mmol/L (ref 96–106)
Creatinine, Ser: 0.96 mg/dL (ref 0.57–1.00)
Glucose: 83 mg/dL (ref 70–99)
Potassium: 3.8 mmol/L (ref 3.5–5.2)
Sodium: 143 mmol/L (ref 134–144)
eGFR: 68 mL/min/{1.73_m2} (ref >59)

## 2023-08-09 LAB — HEMOGLOBIN A1C
Est. average glucose Bld gHb Est-mCnc: 120 mg/dL
Hgb A1c MFr Bld: 5.8 % — ABNORMAL HIGH (ref 4.8–5.6)

## 2023-08-09 NOTE — Telephone Encounter (Signed)
This RN spoke with pt yesterday pm per her call stating request to be seen by Dr Al Pimple "I want her to feel these knots in my stomach and under my arm"  Note pt also states she has been "released from care with Dr Pricilla Holm because of multiple postponements initiated by her with recommended surgery for Atypical endometrial hyperplasia"  "I don't know if Dr Al Pimple can smooth things over so I could go back" - per chart review noted multiple surgical dates from 10/28/2021 to most recently 07/27/2023. Surgical cancellation per review are noted as relating to need for pt to obtain cardiac clearance and or pt having issues (flu,ear infection) or desire to delay for personal reasons.  Request for appt sent to scheduling for next available- note pt canceled appt scheduled in July 2024 with Dr Al Pimple.  Note pt is also followed by Palliative Care per pt's chronic pain issues and use of methadone with good control.

## 2023-08-10 ENCOUNTER — Other Ambulatory Visit: Payer: Self-pay | Admitting: Gynecologic Oncology

## 2023-08-10 ENCOUNTER — Telehealth: Payer: Self-pay

## 2023-08-10 ENCOUNTER — Other Ambulatory Visit (HOSPITAL_COMMUNITY): Payer: Self-pay

## 2023-08-10 DIAGNOSIS — N8502 Endometrial intraepithelial neoplasia [EIN]: Secondary | ICD-10-CM

## 2023-08-10 DIAGNOSIS — I5020 Unspecified systolic (congestive) heart failure: Secondary | ICD-10-CM | POA: Diagnosis not present

## 2023-08-10 MED ORDER — MEDROXYPROGESTERONE ACETATE 10 MG PO TABS
10.0000 mg | ORAL_TABLET | Freq: Every day | ORAL | 4 refills | Status: AC
  Filled 2023-08-10 – 2023-09-03 (×3): qty 30, 30d supply, fill #0
  Filled 2023-10-07 – 2023-10-20 (×2): qty 30, 30d supply, fill #1
  Filled 2023-11-28 – 2023-12-05 (×2): qty 30, 30d supply, fill #2
  Filled 2024-01-04 – 2024-01-10 (×2): qty 30, 30d supply, fill #3
  Filled 2024-02-13 – 2024-02-20 (×2): qty 30, 30d supply, fill #4

## 2023-08-10 MED ORDER — MEDROXYPROGESTERONE ACETATE 10 MG PO TABS
10.0000 mg | ORAL_TABLET | Freq: Every day | ORAL | 0 refills | Status: DC
  Filled 2023-08-10: qty 30, 30d supply, fill #0

## 2023-08-10 NOTE — Telephone Encounter (Signed)
Refill sent.

## 2023-08-10 NOTE — Telephone Encounter (Signed)
[name] calling for a refill of her Medroxyprogesterone until she can get re-established with her OB-GYN.  Pharmacy updated in chart

## 2023-08-11 DIAGNOSIS — I5020 Unspecified systolic (congestive) heart failure: Secondary | ICD-10-CM | POA: Diagnosis not present

## 2023-08-11 NOTE — Telephone Encounter (Signed)
Pt is aware of refill being sent to pharmacy. She was thankful and states she will call GYN office to get scheduled.

## 2023-08-12 DIAGNOSIS — I5020 Unspecified systolic (congestive) heart failure: Secondary | ICD-10-CM | POA: Diagnosis not present

## 2023-08-13 DIAGNOSIS — I5020 Unspecified systolic (congestive) heart failure: Secondary | ICD-10-CM | POA: Diagnosis not present

## 2023-08-14 DIAGNOSIS — I5020 Unspecified systolic (congestive) heart failure: Secondary | ICD-10-CM | POA: Diagnosis not present
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
## 2023-08-15 ENCOUNTER — Other Ambulatory Visit: Payer: Self-pay | Admitting: Nurse Practitioner

## 2023-08-15 ENCOUNTER — Other Ambulatory Visit (HOSPITAL_COMMUNITY): Payer: Self-pay

## 2023-08-15 ENCOUNTER — Other Ambulatory Visit: Payer: Self-pay | Admitting: Student

## 2023-08-15 DIAGNOSIS — Z515 Encounter for palliative care: Secondary | ICD-10-CM

## 2023-08-15 DIAGNOSIS — G893 Neoplasm related pain (acute) (chronic): Secondary | ICD-10-CM

## 2023-08-15 DIAGNOSIS — C50412 Malignant neoplasm of upper-outer quadrant of left female breast: Secondary | ICD-10-CM

## 2023-08-15 DIAGNOSIS — I5020 Unspecified systolic (congestive) heart failure: Secondary | ICD-10-CM | POA: Diagnosis not present

## 2023-08-15 MED ORDER — METHADONE HCL 5 MG PO TABS
15.0000 mg | ORAL_TABLET | Freq: Three times a day (TID) | ORAL | 0 refills | Status: DC
Start: 2023-08-19 — End: 2023-09-09
  Filled 2023-08-19: qty 270, 30d supply, fill #0

## 2023-08-15 MED ORDER — METHADONE HCL 5 MG PO TABS
15.0000 mg | ORAL_TABLET | Freq: Three times a day (TID) | ORAL | 0 refills | Status: DC
Start: 2023-08-20 — End: 2023-08-15
  Filled ????-??-??: fill #0

## 2023-08-15 NOTE — Telephone Encounter (Signed)
Pt called for refill, see associated orders

## 2023-08-16 ENCOUNTER — Other Ambulatory Visit (HOSPITAL_COMMUNITY): Payer: Self-pay

## 2023-08-16 ENCOUNTER — Encounter: Payer: Self-pay | Admitting: Adult Health

## 2023-08-16 ENCOUNTER — Ambulatory Visit: Payer: Medicaid Other | Attending: Nurse Practitioner | Admitting: Adult Health

## 2023-08-16 VITALS — BP 124/62 | HR 78 | Ht 65.0 in | Wt 213.8 lb

## 2023-08-16 DIAGNOSIS — I10 Essential (primary) hypertension: Secondary | ICD-10-CM

## 2023-08-16 DIAGNOSIS — Z01818 Encounter for other preprocedural examination: Secondary | ICD-10-CM

## 2023-08-16 DIAGNOSIS — I2510 Atherosclerotic heart disease of native coronary artery without angina pectoris: Secondary | ICD-10-CM

## 2023-08-16 DIAGNOSIS — E7800 Pure hypercholesterolemia, unspecified: Secondary | ICD-10-CM

## 2023-08-16 DIAGNOSIS — I5020 Unspecified systolic (congestive) heart failure: Secondary | ICD-10-CM | POA: Diagnosis not present

## 2023-08-16 DIAGNOSIS — I509 Heart failure, unspecified: Secondary | ICD-10-CM

## 2023-08-16 MED ORDER — EZETIMIBE 10 MG PO TABS
10.0000 mg | ORAL_TABLET | Freq: Every day | ORAL | 1 refills | Status: DC
  Filled 2023-08-16: qty 30, 30d supply, fill #0
  Filled 2023-09-21: qty 30, 30d supply, fill #1

## 2023-08-16 NOTE — Patient Instructions (Signed)
Medication Instructions:  No changes *If you need a refill on your cardiac medications before your next appointment, please call your pharmacy*   Lab Work: No Labs If you have labs (blood work) drawn today and your tests are completely normal, you will receive your results only by: MyChart Message (if you have MyChart) OR A paper copy in the mail If you have any lab test that is abnormal or we need to change your treatment, we will call you to review the results.   Testing/Procedures: [address], [address]. Your physician has requested that you have an echocardiogram. Echocardiography is a painless test that uses sound waves to create images of your heart. It provides your doctor with information about the size and shape of your heart and how well your heart's chambers and valves are working. This procedure takes approximately one hour. There are no restrictions for this procedure. Please do NOT wear cologne, perfume, aftershave, or lotions (deodorant is allowed). Please arrive 15 minutes prior to your appointment time.    [address]. Your physician has requested that you have a lexiscan myoview. For further information please visit [email or web address] Please follow instruction sheet, as given.  Follow-Up: At [facility], you and your health needs are our priority.  As part of our continuing mission to provide you with exceptional heart care, we have created designated Provider Care Teams.  These Care Teams include your primary Cardiologist (physician) and Advanced Practice Providers (APPs -  Physician Assistants and Nurse Practitioners) who all work together to provide you with the care you need, when you need it.  We recommend signing up for the patient portal called "MyChart".  Sign up information is provided on this After Visit Summary.  MyChart is used to connect with patients for Virtual Visits (Telemedicine).  Patients are able to view  lab/test results, encounter notes, upcoming appointments, etc.  Non-urgent messages can be sent to your provider as well.   To learn more about what you can do with MyChart, go to [email or web address].    Your next appointment:   First Available (Post Testing)  Provider:   Thomasene Ripple , DO

## 2023-08-17 ENCOUNTER — Other Ambulatory Visit (HOSPITAL_COMMUNITY): Payer: Self-pay

## 2023-08-17 DIAGNOSIS — I5020 Unspecified systolic (congestive) heart failure: Secondary | ICD-10-CM | POA: Diagnosis not present

## 2023-08-18 DIAGNOSIS — I5020 Unspecified systolic (congestive) heart failure: Secondary | ICD-10-CM | POA: Diagnosis not present

## 2023-08-19 ENCOUNTER — Other Ambulatory Visit (HOSPITAL_COMMUNITY): Payer: Self-pay

## 2023-08-19 ENCOUNTER — Encounter: Payer: Medicaid Other | Admitting: Gynecologic Oncology

## 2023-08-19 DIAGNOSIS — I5020 Unspecified systolic (congestive) heart failure: Secondary | ICD-10-CM | POA: Diagnosis not present

## 2023-08-20 DIAGNOSIS — I5020 Unspecified systolic (congestive) heart failure: Secondary | ICD-10-CM | POA: Diagnosis not present

## 2023-08-21 DIAGNOSIS — I5020 Unspecified systolic (congestive) heart failure: Secondary | ICD-10-CM | POA: Diagnosis not present

## 2023-08-22 DIAGNOSIS — I5020 Unspecified systolic (congestive) heart failure: Secondary | ICD-10-CM | POA: Diagnosis not present

## 2023-08-23 ENCOUNTER — Other Ambulatory Visit (HOSPITAL_COMMUNITY): Payer: Self-pay

## 2023-08-23 DIAGNOSIS — I5020 Unspecified systolic (congestive) heart failure: Secondary | ICD-10-CM | POA: Diagnosis not present

## 2023-08-24 ENCOUNTER — Other Ambulatory Visit (HOSPITAL_COMMUNITY): Payer: Self-pay

## 2023-08-24 DIAGNOSIS — I5020 Unspecified systolic (congestive) heart failure: Secondary | ICD-10-CM | POA: Diagnosis not present

## 2023-08-25 DIAGNOSIS — I5020 Unspecified systolic (congestive) heart failure: Secondary | ICD-10-CM | POA: Diagnosis not present

## 2023-08-26 DIAGNOSIS — I5020 Unspecified systolic (congestive) heart failure: Secondary | ICD-10-CM | POA: Diagnosis not present

## 2023-08-27 ENCOUNTER — Other Ambulatory Visit (HOSPITAL_COMMUNITY): Payer: Self-pay

## 2023-08-27 DIAGNOSIS — I5020 Unspecified systolic (congestive) heart failure: Secondary | ICD-10-CM | POA: Diagnosis not present

## 2023-08-28 DIAGNOSIS — I5020 Unspecified systolic (congestive) heart failure: Secondary | ICD-10-CM | POA: Diagnosis not present

## 2023-08-29 DIAGNOSIS — I5020 Unspecified systolic (congestive) heart failure: Secondary | ICD-10-CM | POA: Diagnosis not present

## 2023-08-30 ENCOUNTER — Other Ambulatory Visit (HOSPITAL_COMMUNITY): Payer: Self-pay

## 2023-08-30 DIAGNOSIS — I5020 Unspecified systolic (congestive) heart failure: Secondary | ICD-10-CM | POA: Diagnosis not present

## 2023-08-31 DIAGNOSIS — I5020 Unspecified systolic (congestive) heart failure: Secondary | ICD-10-CM | POA: Diagnosis not present

## 2023-09-01 ENCOUNTER — Other Ambulatory Visit: Payer: Self-pay | Admitting: Student

## 2023-09-01 ENCOUNTER — Other Ambulatory Visit (HOSPITAL_COMMUNITY): Payer: Self-pay

## 2023-09-01 ENCOUNTER — Telehealth (HOSPITAL_COMMUNITY): Payer: Self-pay

## 2023-09-01 DIAGNOSIS — I5020 Unspecified systolic (congestive) heart failure: Secondary | ICD-10-CM | POA: Diagnosis not present

## 2023-09-01 NOTE — Telephone Encounter (Signed)
Spoke with the patient, detailed instructions given. She stated that she would be here for her test. S.Zadin Lange CCT

## 2023-09-02 ENCOUNTER — Other Ambulatory Visit (HOSPITAL_COMMUNITY): Payer: Self-pay

## 2023-09-02 DIAGNOSIS — I5020 Unspecified systolic (congestive) heart failure: Secondary | ICD-10-CM | POA: Diagnosis not present

## 2023-09-02 MED ORDER — FUROSEMIDE 20 MG PO TABS
40.0000 mg | ORAL_TABLET | Freq: Every day | ORAL | 0 refills | Status: DC
  Filled 2023-09-02: qty 60, 30d supply, fill #0

## 2023-09-03 ENCOUNTER — Other Ambulatory Visit (HOSPITAL_COMMUNITY): Payer: Self-pay

## 2023-09-03 DIAGNOSIS — I5020 Unspecified systolic (congestive) heart failure: Secondary | ICD-10-CM | POA: Diagnosis not present

## 2023-09-04 DIAGNOSIS — I5020 Unspecified systolic (congestive) heart failure: Secondary | ICD-10-CM | POA: Diagnosis not present

## 2023-09-05 DIAGNOSIS — I5020 Unspecified systolic (congestive) heart failure: Secondary | ICD-10-CM | POA: Diagnosis not present

## 2023-09-06 ENCOUNTER — Ambulatory Visit (HOSPITAL_BASED_OUTPATIENT_CLINIC_OR_DEPARTMENT_OTHER): Payer: Medicaid Other

## 2023-09-06 ENCOUNTER — Ambulatory Visit (HOSPITAL_COMMUNITY): Payer: Medicaid Other | Attending: Adult Health

## 2023-09-06 VITALS — Ht 65.0 in | Wt 213.0 lb

## 2023-09-06 DIAGNOSIS — I2510 Atherosclerotic heart disease of native coronary artery without angina pectoris: Secondary | ICD-10-CM | POA: Diagnosis not present

## 2023-09-06 DIAGNOSIS — Z01818 Encounter for other preprocedural examination: Secondary | ICD-10-CM

## 2023-09-06 DIAGNOSIS — I509 Heart failure, unspecified: Secondary | ICD-10-CM

## 2023-09-06 DIAGNOSIS — E7800 Pure hypercholesterolemia, unspecified: Secondary | ICD-10-CM | POA: Diagnosis present

## 2023-09-06 DIAGNOSIS — E785 Hyperlipidemia, unspecified: Secondary | ICD-10-CM

## 2023-09-06 DIAGNOSIS — I10 Essential (primary) hypertension: Secondary | ICD-10-CM | POA: Diagnosis present

## 2023-09-06 DIAGNOSIS — I5020 Unspecified systolic (congestive) heart failure: Secondary | ICD-10-CM | POA: Diagnosis not present

## 2023-09-06 LAB — MYOCARDIAL PERFUSION IMAGING
LV dias vol: 156 mL (ref 46–106)
LV sys vol: 113 mL
Nuc Stress EF: 27 %
Peak HR: 100 {beats}/min
Rest HR: 86 {beats}/min
Rest Nuclear Isotope Dose: 10.7 mCi
SDS: 1
SRS: 2
SSS: 3
ST Depression (mm): 0 mm
Stress Nuclear Isotope Dose: 30.9 mCi
TID: 0.97

## 2023-09-06 LAB — ECHOCARDIOGRAM COMPLETE
Area-P 1/2: 5.54 cm2
Height: 65 in
S' Lateral: 4.2 cm
Weight: 3408 [oz_av]

## 2023-09-06 MED ORDER — REGADENOSON 0.4 MG/5ML IV SOLN
0.4000 mg | Freq: Once | INTRAVENOUS | Status: AC
  Administered 2023-09-06: 0.4 mg via INTRAVENOUS

## 2023-09-06 MED ORDER — TECHNETIUM TC 99M TETROFOSMIN IV KIT
30.9000 | PACK | Freq: Once | INTRAVENOUS | Status: AC | PRN
  Administered 2023-09-06: 30.9 via INTRAVENOUS

## 2023-09-06 MED ORDER — TECHNETIUM TC 99M TETROFOSMIN IV KIT
10.7000 | PACK | Freq: Once | INTRAVENOUS | Status: AC | PRN
  Administered 2023-09-06: 10.7 via INTRAVENOUS

## 2023-09-07 ENCOUNTER — Telehealth: Payer: Self-pay

## 2023-09-07 DIAGNOSIS — I5020 Unspecified systolic (congestive) heart failure: Secondary | ICD-10-CM | POA: Diagnosis not present

## 2023-09-07 NOTE — Telephone Encounter (Addendum)
Called patient regarding results. Unable to leave message.----- Message from Joni Reining sent at 09/06/2023  4:19 PM EDT ----- Nuclear medicine stress test revealed evidence of her prior heart attack with no new areas of ischemia.  She has reduced heart pumping function.  This test is the same as prior nuclear medicine stress test completed in July 2024.  Will consider follow-up with Dr. Marlowe Aschoff to discuss need to be seen by advanced heart failure team.  Echocardiogram is ordered to correlate with nuclear medicine stress test.

## 2023-09-07 NOTE — Telephone Encounter (Addendum)
Called patient regarding results. Unable to leave message.----- Message from Joni Reining sent at 09/06/2023  4:21 PM EDT ----- I have reviewed the echocardiogram.  This reveals significantly reduced ejection fraction of 30 to 35%.  This corresponds with nuclear medicine stress test revealing scar from previous heart attack.  She also has evidence of severe mitral valve regurgitation.  She should follow-up with Dr. Lavona Mound Tobb to discuss and plan treatment regimen to include evaluation of mitral valve, as well as referral to advanced heart failure team.  She has appointment October 3 previously scheduled.

## 2023-09-08 DIAGNOSIS — I5020 Unspecified systolic (congestive) heart failure: Secondary | ICD-10-CM | POA: Diagnosis not present

## 2023-09-09 ENCOUNTER — Other Ambulatory Visit: Payer: Self-pay

## 2023-09-09 ENCOUNTER — Other Ambulatory Visit (HOSPITAL_COMMUNITY): Payer: Self-pay

## 2023-09-09 DIAGNOSIS — C50412 Malignant neoplasm of upper-outer quadrant of left female breast: Secondary | ICD-10-CM

## 2023-09-09 DIAGNOSIS — I5020 Unspecified systolic (congestive) heart failure: Secondary | ICD-10-CM | POA: Diagnosis not present

## 2023-09-09 DIAGNOSIS — Z515 Encounter for palliative care: Secondary | ICD-10-CM

## 2023-09-09 DIAGNOSIS — G893 Neoplasm related pain (acute) (chronic): Secondary | ICD-10-CM

## 2023-09-09 MED ORDER — METHADONE HCL 5 MG PO TABS
15.0000 mg | ORAL_TABLET | Freq: Three times a day (TID) | ORAL | 0 refills | Status: DC
Start: 2023-09-17 — End: 2023-10-20
  Filled 2023-09-17: qty 270, 30d supply, fill #0

## 2023-09-09 NOTE — Telephone Encounter (Signed)
Pt called for medication refill, see orders

## 2023-09-10 DIAGNOSIS — I5020 Unspecified systolic (congestive) heart failure: Secondary | ICD-10-CM | POA: Diagnosis not present

## 2023-09-11 DIAGNOSIS — I5020 Unspecified systolic (congestive) heart failure: Secondary | ICD-10-CM | POA: Diagnosis not present

## 2023-09-12 ENCOUNTER — Other Ambulatory Visit (HOSPITAL_COMMUNITY): Payer: Self-pay

## 2023-09-12 DIAGNOSIS — I5020 Unspecified systolic (congestive) heart failure: Secondary | ICD-10-CM | POA: Diagnosis not present

## 2023-09-13 ENCOUNTER — Other Ambulatory Visit (HOSPITAL_COMMUNITY): Payer: Self-pay

## 2023-09-13 DIAGNOSIS — I5020 Unspecified systolic (congestive) heart failure: Secondary | ICD-10-CM | POA: Diagnosis not present

## 2023-09-14 DIAGNOSIS — I5020 Unspecified systolic (congestive) heart failure: Secondary | ICD-10-CM | POA: Diagnosis not present

## 2023-09-15 DIAGNOSIS — I5020 Unspecified systolic (congestive) heart failure: Secondary | ICD-10-CM | POA: Diagnosis not present

## 2023-09-16 ENCOUNTER — Inpatient Hospital Stay: Payer: Medicaid Other | Admitting: Hematology and Oncology

## 2023-09-16 DIAGNOSIS — I5020 Unspecified systolic (congestive) heart failure: Secondary | ICD-10-CM | POA: Diagnosis not present

## 2023-09-17 ENCOUNTER — Other Ambulatory Visit (HOSPITAL_COMMUNITY): Payer: Self-pay

## 2023-09-17 DIAGNOSIS — I5020 Unspecified systolic (congestive) heart failure: Secondary | ICD-10-CM | POA: Diagnosis not present

## 2023-09-18 DIAGNOSIS — I5020 Unspecified systolic (congestive) heart failure: Secondary | ICD-10-CM | POA: Diagnosis not present

## 2023-09-19 ENCOUNTER — Telehealth: Payer: Self-pay | Admitting: Hematology and Oncology

## 2023-09-19 ENCOUNTER — Other Ambulatory Visit (HOSPITAL_COMMUNITY): Payer: Self-pay

## 2023-09-19 DIAGNOSIS — I5020 Unspecified systolic (congestive) heart failure: Secondary | ICD-10-CM | POA: Diagnosis not present

## 2023-09-19 NOTE — Telephone Encounter (Signed)
Patient is aware of scheduled appointment times/dates with [facility]

## 2023-09-20 ENCOUNTER — Other Ambulatory Visit (HOSPITAL_COMMUNITY): Payer: Self-pay

## 2023-09-20 DIAGNOSIS — I5020 Unspecified systolic (congestive) heart failure: Secondary | ICD-10-CM | POA: Diagnosis not present
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
## 2023-09-21 ENCOUNTER — Encounter: Payer: Self-pay | Admitting: Nurse Practitioner

## 2023-09-21 ENCOUNTER — Other Ambulatory Visit (HOSPITAL_COMMUNITY): Payer: Self-pay

## 2023-09-21 ENCOUNTER — Inpatient Hospital Stay: Payer: Medicaid Other | Attending: Adult Health | Admitting: Nurse Practitioner

## 2023-09-21 DIAGNOSIS — Z515 Encounter for palliative care: Secondary | ICD-10-CM | POA: Diagnosis not present

## 2023-09-21 DIAGNOSIS — I5020 Unspecified systolic (congestive) heart failure: Secondary | ICD-10-CM | POA: Diagnosis not present

## 2023-09-21 DIAGNOSIS — C50412 Malignant neoplasm of upper-outer quadrant of left female breast: Secondary | ICD-10-CM

## 2023-09-21 DIAGNOSIS — G893 Neoplasm related pain (acute) (chronic): Secondary | ICD-10-CM | POA: Diagnosis not present

## 2023-09-22 ENCOUNTER — Ambulatory Visit: Payer: Medicaid Other | Admitting: Cardiology

## 2023-09-22 DIAGNOSIS — I5020 Unspecified systolic (congestive) heart failure: Secondary | ICD-10-CM | POA: Diagnosis not present

## 2023-09-23 DIAGNOSIS — I5020 Unspecified systolic (congestive) heart failure: Secondary | ICD-10-CM | POA: Diagnosis not present

## 2023-09-24 DIAGNOSIS — I5020 Unspecified systolic (congestive) heart failure: Secondary | ICD-10-CM | POA: Diagnosis not present

## 2023-09-25 DIAGNOSIS — I5020 Unspecified systolic (congestive) heart failure: Secondary | ICD-10-CM | POA: Diagnosis not present

## 2023-09-26 DIAGNOSIS — I5020 Unspecified systolic (congestive) heart failure: Secondary | ICD-10-CM | POA: Diagnosis not present

## 2023-09-27 DIAGNOSIS — I5020 Unspecified systolic (congestive) heart failure: Secondary | ICD-10-CM | POA: Diagnosis not present

## 2023-09-28 DIAGNOSIS — I5020 Unspecified systolic (congestive) heart failure: Secondary | ICD-10-CM | POA: Diagnosis not present

## 2023-09-29 DIAGNOSIS — I5020 Unspecified systolic (congestive) heart failure: Secondary | ICD-10-CM | POA: Diagnosis not present

## 2023-09-30 DIAGNOSIS — I5020 Unspecified systolic (congestive) heart failure: Secondary | ICD-10-CM | POA: Diagnosis not present

## 2023-10-01 DIAGNOSIS — I5020 Unspecified systolic (congestive) heart failure: Secondary | ICD-10-CM | POA: Diagnosis not present

## 2023-10-02 DIAGNOSIS — I5020 Unspecified systolic (congestive) heart failure: Secondary | ICD-10-CM | POA: Diagnosis not present

## 2023-10-03 DIAGNOSIS — I5020 Unspecified systolic (congestive) heart failure: Secondary | ICD-10-CM | POA: Diagnosis not present

## 2023-10-04 DIAGNOSIS — I5020 Unspecified systolic (congestive) heart failure: Secondary | ICD-10-CM | POA: Diagnosis not present

## 2023-10-05 DIAGNOSIS — I5020 Unspecified systolic (congestive) heart failure: Secondary | ICD-10-CM | POA: Diagnosis not present

## 2023-10-06 DIAGNOSIS — I5020 Unspecified systolic (congestive) heart failure: Secondary | ICD-10-CM | POA: Diagnosis not present

## 2023-10-07 ENCOUNTER — Other Ambulatory Visit (HOSPITAL_COMMUNITY): Payer: Self-pay

## 2023-10-07 ENCOUNTER — Inpatient Hospital Stay: Payer: Medicaid Other | Admitting: Hematology and Oncology

## 2023-10-07 ENCOUNTER — Other Ambulatory Visit: Payer: Self-pay | Admitting: Student

## 2023-10-07 DIAGNOSIS — I5020 Unspecified systolic (congestive) heart failure: Secondary | ICD-10-CM | POA: Diagnosis not present

## 2023-10-07 MED ORDER — POTASSIUM CHLORIDE CRYS ER 20 MEQ PO TBCR
20.0000 meq | EXTENDED_RELEASE_TABLET | Freq: Three times a day (TID) | ORAL | 0 refills | Status: DC
  Filled 2023-10-07 – 2023-10-20 (×2): qty 270, 90d supply, fill #0

## 2023-10-07 MED ORDER — FUROSEMIDE 20 MG PO TABS
40.0000 mg | ORAL_TABLET | Freq: Every day | ORAL | 0 refills | Status: DC
  Filled 2023-10-07 – 2023-10-20 (×2): qty 60, 30d supply, fill #0

## 2023-10-07 MED ORDER — FLUTICASONE PROPIONATE 50 MCG/ACT NA SUSP
1.0000 | Freq: Every day | NASAL | 2 refills | Status: DC
  Filled 2023-10-07: qty 16, 34d supply, fill #0
  Filled 2023-10-20: qty 16, 30d supply, fill #0
  Filled 2023-11-28 – 2024-07-31 (×2): qty 16, 30d supply, fill #1
  Filled 2024-09-17: qty 16, 30d supply, fill #2

## 2023-10-08 DIAGNOSIS — I5020 Unspecified systolic (congestive) heart failure: Secondary | ICD-10-CM | POA: Diagnosis not present

## 2023-10-09 DIAGNOSIS — I5020 Unspecified systolic (congestive) heart failure: Secondary | ICD-10-CM | POA: Diagnosis not present

## 2023-10-10 ENCOUNTER — Telehealth: Payer: Self-pay | Admitting: Hematology and Oncology

## 2023-10-10 ENCOUNTER — Other Ambulatory Visit: Payer: Self-pay | Admitting: Student

## 2023-10-10 ENCOUNTER — Telehealth: Payer: Self-pay

## 2023-10-10 ENCOUNTER — Other Ambulatory Visit (HOSPITAL_COMMUNITY): Payer: Self-pay

## 2023-10-10 DIAGNOSIS — I5020 Unspecified systolic (congestive) heart failure: Secondary | ICD-10-CM | POA: Diagnosis not present

## 2023-10-10 NOTE — Telephone Encounter (Signed)
Pt LVM stating that she missed her appt on 10/18 with Dr. Al Pimple. This RN sent scheduling a message to reschedule pt.

## 2023-10-10 NOTE — Telephone Encounter (Signed)
Spoke with patient confirming upcoming appointment  

## 2023-10-11 ENCOUNTER — Other Ambulatory Visit (HOSPITAL_COMMUNITY): Payer: Self-pay

## 2023-10-11 DIAGNOSIS — I5020 Unspecified systolic (congestive) heart failure: Secondary | ICD-10-CM | POA: Diagnosis not present

## 2023-10-11 MED ORDER — EZETIMIBE 10 MG PO TABS
10.0000 mg | ORAL_TABLET | Freq: Every day | ORAL | 1 refills | Status: DC
  Filled 2023-10-11 – 2023-10-20 (×2): qty 30, 30d supply, fill #0
  Filled 2023-11-28 – 2023-12-05 (×2): qty 30, 30d supply, fill #1

## 2023-10-12 DIAGNOSIS — I5020 Unspecified systolic (congestive) heart failure: Secondary | ICD-10-CM | POA: Diagnosis not present

## 2023-10-13 DIAGNOSIS — I5020 Unspecified systolic (congestive) heart failure: Secondary | ICD-10-CM | POA: Diagnosis not present

## 2023-10-14 DIAGNOSIS — I5020 Unspecified systolic (congestive) heart failure: Secondary | ICD-10-CM | POA: Diagnosis not present

## 2023-10-15 DIAGNOSIS — I5020 Unspecified systolic (congestive) heart failure: Secondary | ICD-10-CM | POA: Diagnosis not present

## 2023-10-16 ENCOUNTER — Other Ambulatory Visit: Payer: Self-pay

## 2023-10-16 ENCOUNTER — Emergency Department (HOSPITAL_COMMUNITY): Payer: Medicaid Other

## 2023-10-16 ENCOUNTER — Inpatient Hospital Stay (HOSPITAL_COMMUNITY)
Admission: EM | Admit: 2023-10-16 | Discharge: 2023-10-20 | DRG: 291 | Disposition: A | Discharge status: Home / Self Care | Payer: Medicaid Other | Attending: Family Medicine | Admitting: Family Medicine

## 2023-10-16 ENCOUNTER — Encounter (HOSPITAL_COMMUNITY): Payer: Self-pay

## 2023-10-16 DIAGNOSIS — I160 Hypertensive urgency: Secondary | ICD-10-CM | POA: Diagnosis not present

## 2023-10-16 DIAGNOSIS — E785 Hyperlipidemia, unspecified: Secondary | ICD-10-CM | POA: Diagnosis present

## 2023-10-16 DIAGNOSIS — R000 Tachycardia, unspecified: Secondary | ICD-10-CM | POA: Diagnosis not present

## 2023-10-16 DIAGNOSIS — I272 Pulmonary hypertension, unspecified: Secondary | ICD-10-CM | POA: Diagnosis present

## 2023-10-16 DIAGNOSIS — I213 ST elevation (STEMI) myocardial infarction of unspecified site: Secondary | ICD-10-CM | POA: Diagnosis not present

## 2023-10-16 DIAGNOSIS — Z79891 Long term (current) use of opiate analgesic: Secondary | ICD-10-CM

## 2023-10-16 DIAGNOSIS — Z8249 Family history of ischemic heart disease and other diseases of the circulatory system: Secondary | ICD-10-CM

## 2023-10-16 DIAGNOSIS — E669 Obesity, unspecified: Secondary | ICD-10-CM | POA: Diagnosis present

## 2023-10-16 DIAGNOSIS — Z7982 Long term (current) use of aspirin: Secondary | ICD-10-CM

## 2023-10-16 DIAGNOSIS — Z6835 Body mass index (BMI) 35.0-35.9, adult: Secondary | ICD-10-CM

## 2023-10-16 DIAGNOSIS — I509 Heart failure, unspecified: Secondary | ICD-10-CM

## 2023-10-16 DIAGNOSIS — Z1152 Encounter for screening for COVID-19: Secondary | ICD-10-CM

## 2023-10-16 DIAGNOSIS — E1122 Type 2 diabetes mellitus with diabetic chronic kidney disease: Secondary | ICD-10-CM | POA: Diagnosis present

## 2023-10-16 DIAGNOSIS — N182 Chronic kidney disease, stage 2 (mild): Secondary | ICD-10-CM | POA: Diagnosis present

## 2023-10-16 DIAGNOSIS — Z9221 Personal history of antineoplastic chemotherapy: Secondary | ICD-10-CM

## 2023-10-16 DIAGNOSIS — Z923 Personal history of irradiation: Secondary | ICD-10-CM

## 2023-10-16 DIAGNOSIS — I340 Nonrheumatic mitral (valve) insufficiency: Secondary | ICD-10-CM | POA: Diagnosis present

## 2023-10-16 DIAGNOSIS — Z5982 Transportation insecurity: Secondary | ICD-10-CM

## 2023-10-16 DIAGNOSIS — Z955 Presence of coronary angioplasty implant and graft: Secondary | ICD-10-CM

## 2023-10-16 DIAGNOSIS — Z853 Personal history of malignant neoplasm of breast: Secondary | ICD-10-CM

## 2023-10-16 DIAGNOSIS — Z79899 Other long term (current) drug therapy: Secondary | ICD-10-CM

## 2023-10-16 DIAGNOSIS — D75839 Thrombocytosis, unspecified: Secondary | ICD-10-CM | POA: Diagnosis present

## 2023-10-16 DIAGNOSIS — Z9013 Acquired absence of bilateral breasts and nipples: Secondary | ICD-10-CM

## 2023-10-16 DIAGNOSIS — I10 Essential (primary) hypertension: Secondary | ICD-10-CM | POA: Diagnosis present

## 2023-10-16 DIAGNOSIS — I5033 Acute on chronic diastolic (congestive) heart failure: Secondary | ICD-10-CM | POA: Diagnosis present

## 2023-10-16 DIAGNOSIS — E876 Hypokalemia: Secondary | ICD-10-CM | POA: Diagnosis present

## 2023-10-16 DIAGNOSIS — M109 Gout, unspecified: Secondary | ICD-10-CM | POA: Diagnosis present

## 2023-10-16 DIAGNOSIS — Z7984 Long term (current) use of oral hypoglycemic drugs: Secondary | ICD-10-CM

## 2023-10-16 DIAGNOSIS — R0689 Other abnormalities of breathing: Secondary | ICD-10-CM | POA: Diagnosis not present

## 2023-10-16 DIAGNOSIS — I2510 Atherosclerotic heart disease of native coronary artery without angina pectoris: Secondary | ICD-10-CM | POA: Diagnosis present

## 2023-10-16 DIAGNOSIS — F17210 Nicotine dependence, cigarettes, uncomplicated: Secondary | ICD-10-CM | POA: Diagnosis present

## 2023-10-16 DIAGNOSIS — I130 Hypertensive heart and chronic kidney disease with heart failure and stage 1 through stage 4 chronic kidney disease, or unspecified chronic kidney disease: Principal | ICD-10-CM | POA: Diagnosis present

## 2023-10-16 DIAGNOSIS — I5043 Acute on chronic combined systolic (congestive) and diastolic (congestive) heart failure: Principal | ICD-10-CM

## 2023-10-16 DIAGNOSIS — I110 Hypertensive heart disease with heart failure: Secondary | ICD-10-CM | POA: Diagnosis not present

## 2023-10-16 DIAGNOSIS — I959 Hypotension, unspecified: Secondary | ICD-10-CM | POA: Diagnosis not present

## 2023-10-16 DIAGNOSIS — I5020 Unspecified systolic (congestive) heart failure: Secondary | ICD-10-CM | POA: Diagnosis not present

## 2023-10-16 DIAGNOSIS — Z91199 Patient's noncompliance with other medical treatment and regimen due to unspecified reason: Secondary | ICD-10-CM

## 2023-10-16 DIAGNOSIS — Z91148 Patient's other noncompliance with medication regimen for other reason: Secondary | ICD-10-CM

## 2023-10-16 DIAGNOSIS — R062 Wheezing: Secondary | ICD-10-CM | POA: Diagnosis not present

## 2023-10-16 DIAGNOSIS — I878 Other specified disorders of veins: Secondary | ICD-10-CM | POA: Diagnosis present

## 2023-10-16 DIAGNOSIS — I890 Lymphedema, not elsewhere classified: Secondary | ICD-10-CM | POA: Diagnosis present

## 2023-10-16 DIAGNOSIS — I700 Atherosclerosis of aorta: Secondary | ICD-10-CM | POA: Diagnosis present

## 2023-10-16 DIAGNOSIS — E878 Other disorders of electrolyte and fluid balance, not elsewhere classified: Secondary | ICD-10-CM | POA: Insufficient documentation

## 2023-10-16 DIAGNOSIS — Z96651 Presence of right artificial knee joint: Secondary | ICD-10-CM | POA: Diagnosis present

## 2023-10-16 DIAGNOSIS — R7989 Other specified abnormal findings of blood chemistry: Secondary | ICD-10-CM | POA: Insufficient documentation

## 2023-10-16 DIAGNOSIS — G8929 Other chronic pain: Secondary | ICD-10-CM | POA: Diagnosis present

## 2023-10-16 DIAGNOSIS — R9431 Abnormal electrocardiogram [ECG] [EKG]: Secondary | ICD-10-CM | POA: Diagnosis present

## 2023-10-16 DIAGNOSIS — I252 Old myocardial infarction: Secondary | ICD-10-CM

## 2023-10-16 DIAGNOSIS — R079 Chest pain, unspecified: Secondary | ICD-10-CM | POA: Diagnosis present

## 2023-10-16 DIAGNOSIS — F1490 Cocaine use, unspecified, uncomplicated: Secondary | ICD-10-CM | POA: Diagnosis present

## 2023-10-16 LAB — I-STAT VENOUS BLOOD GAS, ED
Acid-base deficit: 3 mmol/L — ABNORMAL HIGH (ref 0.0–2.0)
Bicarbonate: 23.4 mmol/L (ref 20.0–28.0)
Calcium, Ion: 1.06 mmol/L — ABNORMAL LOW (ref 1.15–1.40)
HCT: 49 % — ABNORMAL HIGH (ref 36.0–46.0)
Hemoglobin: 16.7 g/dL — ABNORMAL HIGH (ref 12.0–15.0)
O2 Saturation: 100 %
Potassium: 3 mmol/L — ABNORMAL LOW (ref 3.5–5.1)
Sodium: 144 mmol/L (ref 135–145)
TCO2: 25 mmol/L (ref 22–32)
pCO2, Ven: 44.6 mm[Hg] (ref 44–60)
pH, Ven: 7.329 (ref 7.25–7.43)
pO2, Ven: 222 mm[Hg] — ABNORMAL HIGH (ref 32–45)

## 2023-10-16 NOTE — ED Triage Notes (Addendum)
Pt BIB EMS with c/o respiratory distress that started tonight. Pt received 0.3 epi x2, 10mg  albuterol, and 0.5 atrovent enroute. Pt arrived on CPAP. Per EMS, pt was unable to speak and was tripodding on their arrival. Per pt, she started having SOB and Cp earlier today and she ran out of her diuretic yesterday. Pt has a hx of CHF.
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
## 2023-10-17 ENCOUNTER — Inpatient Hospital Stay (HOSPITAL_COMMUNITY): Payer: Medicaid Other

## 2023-10-17 DIAGNOSIS — I272 Pulmonary hypertension, unspecified: Secondary | ICD-10-CM | POA: Diagnosis not present

## 2023-10-17 DIAGNOSIS — E785 Hyperlipidemia, unspecified: Secondary | ICD-10-CM | POA: Diagnosis not present

## 2023-10-17 DIAGNOSIS — N182 Chronic kidney disease, stage 2 (mild): Secondary | ICD-10-CM | POA: Diagnosis not present

## 2023-10-17 DIAGNOSIS — Z1152 Encounter for screening for COVID-19: Secondary | ICD-10-CM | POA: Diagnosis not present

## 2023-10-17 DIAGNOSIS — E669 Obesity, unspecified: Secondary | ICD-10-CM | POA: Diagnosis not present

## 2023-10-17 DIAGNOSIS — I110 Hypertensive heart disease with heart failure: Secondary | ICD-10-CM | POA: Diagnosis not present

## 2023-10-17 DIAGNOSIS — R0781 Pleurodynia: Secondary | ICD-10-CM | POA: Diagnosis not present

## 2023-10-17 DIAGNOSIS — D75839 Thrombocytosis, unspecified: Secondary | ICD-10-CM | POA: Diagnosis not present

## 2023-10-17 DIAGNOSIS — I5031 Acute diastolic (congestive) heart failure: Secondary | ICD-10-CM

## 2023-10-17 DIAGNOSIS — I890 Lymphedema, not elsewhere classified: Secondary | ICD-10-CM | POA: Diagnosis present

## 2023-10-17 DIAGNOSIS — I5033 Acute on chronic diastolic (congestive) heart failure: Secondary | ICD-10-CM | POA: Diagnosis not present

## 2023-10-17 DIAGNOSIS — R9431 Abnormal electrocardiogram [ECG] [EKG]: Secondary | ICD-10-CM | POA: Diagnosis present

## 2023-10-17 DIAGNOSIS — I5043 Acute on chronic combined systolic (congestive) and diastolic (congestive) heart failure: Secondary | ICD-10-CM

## 2023-10-17 DIAGNOSIS — I2510 Atherosclerotic heart disease of native coronary artery without angina pectoris: Secondary | ICD-10-CM | POA: Diagnosis not present

## 2023-10-17 DIAGNOSIS — I160 Hypertensive urgency: Secondary | ICD-10-CM | POA: Diagnosis not present

## 2023-10-17 DIAGNOSIS — G8929 Other chronic pain: Secondary | ICD-10-CM | POA: Diagnosis present

## 2023-10-17 DIAGNOSIS — Z9221 Personal history of antineoplastic chemotherapy: Secondary | ICD-10-CM | POA: Diagnosis not present

## 2023-10-17 DIAGNOSIS — Z79899 Other long term (current) drug therapy: Secondary | ICD-10-CM | POA: Diagnosis not present

## 2023-10-17 DIAGNOSIS — F1490 Cocaine use, unspecified, uncomplicated: Secondary | ICD-10-CM

## 2023-10-17 DIAGNOSIS — I340 Nonrheumatic mitral (valve) insufficiency: Secondary | ICD-10-CM | POA: Diagnosis not present

## 2023-10-17 DIAGNOSIS — I252 Old myocardial infarction: Secondary | ICD-10-CM | POA: Diagnosis not present

## 2023-10-17 DIAGNOSIS — F17210 Nicotine dependence, cigarettes, uncomplicated: Secondary | ICD-10-CM | POA: Diagnosis present

## 2023-10-17 DIAGNOSIS — E1122 Type 2 diabetes mellitus with diabetic chronic kidney disease: Secondary | ICD-10-CM | POA: Diagnosis not present

## 2023-10-17 DIAGNOSIS — Z91199 Patient's noncompliance with other medical treatment and regimen due to unspecified reason: Secondary | ICD-10-CM

## 2023-10-17 DIAGNOSIS — M109 Gout, unspecified: Secondary | ICD-10-CM | POA: Diagnosis present

## 2023-10-17 DIAGNOSIS — R7989 Other specified abnormal findings of blood chemistry: Secondary | ICD-10-CM | POA: Insufficient documentation

## 2023-10-17 DIAGNOSIS — I509 Heart failure, unspecified: Secondary | ICD-10-CM | POA: Diagnosis present

## 2023-10-17 DIAGNOSIS — Z923 Personal history of irradiation: Secondary | ICD-10-CM | POA: Diagnosis not present

## 2023-10-17 DIAGNOSIS — E876 Hypokalemia: Secondary | ICD-10-CM | POA: Diagnosis present

## 2023-10-17 DIAGNOSIS — I130 Hypertensive heart and chronic kidney disease with heart failure and stage 1 through stage 4 chronic kidney disease, or unspecified chronic kidney disease: Secondary | ICD-10-CM | POA: Diagnosis not present

## 2023-10-17 DIAGNOSIS — I700 Atherosclerosis of aorta: Secondary | ICD-10-CM | POA: Diagnosis not present

## 2023-10-17 DIAGNOSIS — I878 Other specified disorders of veins: Secondary | ICD-10-CM | POA: Diagnosis present

## 2023-10-17 DIAGNOSIS — E878 Other disorders of electrolyte and fluid balance, not elsewhere classified: Secondary | ICD-10-CM | POA: Insufficient documentation

## 2023-10-17 HISTORY — DX: Other specified abnormal findings of blood chemistry: R79.89

## 2023-10-17 LAB — HEPATITIS PANEL, ACUTE
HCV Ab: NONREACTIVE
Hep A IgM: NONREACTIVE
Hep B C IgM: NONREACTIVE
Hepatitis B Surface Ag: NONREACTIVE

## 2023-10-17 LAB — CBC
HCT: 41 % (ref 36.0–46.0)
Hemoglobin: 13.4 g/dL (ref 12.0–15.0)
MCH: 26.1 pg (ref 26.0–34.0)
MCHC: 32.7 g/dL (ref 30.0–36.0)
MCV: 79.8 fL — ABNORMAL LOW (ref 80.0–100.0)
Platelets: 492 10*3/uL — ABNORMAL HIGH (ref 150–400)
RBC: 5.14 MIL/uL — ABNORMAL HIGH (ref 3.87–5.11)
RDW: 16.2 % — ABNORMAL HIGH (ref 11.5–15.5)
WBC: 12.6 10*3/uL — ABNORMAL HIGH (ref 4.0–10.5)
nRBC: 0 % (ref 0.0–0.2)

## 2023-10-17 LAB — TROPONIN I (HIGH SENSITIVITY)
Troponin I (High Sensitivity): 24 ng/L — ABNORMAL HIGH (ref <18)
Troponin I (High Sensitivity): 72 ng/L — ABNORMAL HIGH (ref <18)
Troponin I (High Sensitivity): 133 ng/L (ref <18)
Troponin I (High Sensitivity): 120 ng/L (ref <18)

## 2023-10-17 LAB — COMPREHENSIVE METABOLIC PANEL
ALT: 156 U/L — ABNORMAL HIGH (ref 0–44)
ALT: 155 U/L — ABNORMAL HIGH (ref 0–44)
AST: 182 U/L — ABNORMAL HIGH (ref 15–41)
AST: 169 U/L — ABNORMAL HIGH (ref 15–41)
Albumin: 3.7 g/dL (ref 3.5–5.0)
Albumin: 3.5 g/dL (ref 3.5–5.0)
Alkaline Phosphatase: 135 U/L — ABNORMAL HIGH (ref 38–126)
Alkaline Phosphatase: 114 U/L (ref 38–126)
Anion gap: 12 (ref 5–15)
Anion gap: 12 (ref 5–15)
BUN: 15 mg/dL (ref 6–20)
BUN: 15 mg/dL (ref 6–20)
CO2: 20 mmol/L — ABNORMAL LOW (ref 22–32)
CO2: 21 mmol/L — ABNORMAL LOW (ref 22–32)
Calcium: 8.6 mg/dL — ABNORMAL LOW (ref 8.9–10.3)
Calcium: 8.7 mg/dL — ABNORMAL LOW (ref 8.9–10.3)
Chloride: 107 mmol/L (ref 98–111)
Chloride: 108 mmol/L (ref 98–111)
Creatinine, Ser: 0.95 mg/dL (ref 0.44–1.00)
Creatinine, Ser: 0.9 mg/dL (ref 0.44–1.00)
GFR, Estimated: >60 mL/min (ref >60)
GFR, Estimated: >60 mL/min (ref >60)
Glucose, Bld: 191 mg/dL — ABNORMAL HIGH (ref 70–99)
Glucose, Bld: 117 mg/dL — ABNORMAL HIGH (ref 70–99)
Potassium: 2.9 mmol/L — ABNORMAL LOW (ref 3.5–5.1)
Potassium: 3.2 mmol/L — ABNORMAL LOW (ref 3.5–5.1)
Sodium: 139 mmol/L (ref 135–145)
Sodium: 141 mmol/L (ref 135–145)
Total Bilirubin: 0.6 mg/dL (ref 0.3–1.2)
Total Bilirubin: 0.6 mg/dL (ref 0.3–1.2)
Total Protein: 7.3 g/dL (ref 6.5–8.1)
Total Protein: 6.7 g/dL (ref 6.5–8.1)

## 2023-10-17 LAB — BASIC METABOLIC PANEL
Anion gap: 7 (ref 5–15)
BUN: 11 mg/dL (ref 6–20)
CO2: 20 mmol/L — ABNORMAL LOW (ref 22–32)
Calcium: 8.2 mg/dL — ABNORMAL LOW (ref 8.9–10.3)
Chloride: 110 mmol/L (ref 98–111)
Creatinine, Ser: 0.98 mg/dL (ref 0.44–1.00)
GFR, Estimated: >60 mL/min (ref >60)
Glucose, Bld: 118 mg/dL — ABNORMAL HIGH (ref 70–99)
Potassium: 3.5 mmol/L (ref 3.5–5.1)
Sodium: 137 mmol/L (ref 135–145)

## 2023-10-17 LAB — RAPID URINE DRUG SCREEN, HOSP PERFORMED
Amphetamines: NOT DETECTED
Barbiturates: NOT DETECTED
Benzodiazepines: NOT DETECTED
Cocaine: POSITIVE — AB
Opiates: NOT DETECTED
Tetrahydrocannabinol: NOT DETECTED

## 2023-10-17 LAB — CBC WITH DIFFERENTIAL/PLATELET
Abs Immature Granulocytes: 0.15 10*3/uL — ABNORMAL HIGH (ref 0.00–0.07)
Basophils Absolute: 0.1 10*3/uL (ref 0.0–0.1)
Basophils Relative: 1 %
Eosinophils Absolute: 0.2 10*3/uL (ref 0.0–0.5)
Eosinophils Relative: 1 %
HCT: 48.8 % — ABNORMAL HIGH (ref 36.0–46.0)
Hemoglobin: 14.9 g/dL (ref 12.0–15.0)
Immature Granulocytes: 1 %
Lymphocytes Relative: 23 %
Lymphs Abs: 2.7 10*3/uL (ref 0.7–4.0)
MCH: 25.3 pg — ABNORMAL LOW (ref 26.0–34.0)
MCHC: 30.5 g/dL (ref 30.0–36.0)
MCV: 82.9 fL (ref 80.0–100.0)
Monocytes Absolute: 0.7 10*3/uL (ref 0.1–1.0)
Monocytes Relative: 6 %
Neutro Abs: 8 10*3/uL — ABNORMAL HIGH (ref 1.7–7.7)
Neutrophils Relative %: 68 %
Platelets: 567 10*3/uL — ABNORMAL HIGH (ref 150–400)
RBC: 5.89 MIL/uL — ABNORMAL HIGH (ref 3.87–5.11)
RDW: 17.1 % — ABNORMAL HIGH (ref 11.5–15.5)
WBC: 11.8 10*3/uL — ABNORMAL HIGH (ref 4.0–10.5)
nRBC: 0 % (ref 0.0–0.2)

## 2023-10-17 LAB — RESP PANEL BY RT-PCR (RSV, FLU A&B, COVID)  RVPGX2
Influenza A by PCR: NEGATIVE
Influenza B by PCR: NEGATIVE
Resp Syncytial Virus by PCR: NEGATIVE
SARS Coronavirus 2 by RT PCR: NEGATIVE

## 2023-10-17 LAB — ECHOCARDIOGRAM COMPLETE
Calc EF: 36.2 %
Height: 65 in
MV VTI: 2.43 cm2
S' Lateral: 4.1 cm
Single Plane A2C EF: 39.4 %
Single Plane A4C EF: 32.2 %
Weight: 3255.75 [oz_av]

## 2023-10-17 LAB — MAGNESIUM: Magnesium: 2 mg/dL (ref 1.7–2.4)

## 2023-10-17 LAB — TSH: TSH: 1.454 u[IU]/mL (ref 0.350–4.500)

## 2023-10-17 LAB — BRAIN NATRIURETIC PEPTIDE: B Natriuretic Peptide: 689.4 pg/mL — ABNORMAL HIGH (ref 0.0–100.0)

## 2023-10-17 MED ORDER — POTASSIUM CHLORIDE 10 MEQ/100ML IV SOLN
10.0000 meq | INTRAVENOUS | Status: AC
Start: 2023-10-17 — End: 2023-10-17
  Administered 2023-10-17 (×4): 10 meq via INTRAVENOUS
  Filled 2023-10-17 (×4): qty 100

## 2023-10-17 MED ORDER — FUROSEMIDE 10 MG/ML IJ SOLN
40.0000 mg | Freq: Two times a day (BID) | INTRAMUSCULAR | Status: DC
  Administered 2023-10-17 – 2023-10-18 (×3): 40 mg via INTRAVENOUS
  Filled 2023-10-17 (×4): qty 4

## 2023-10-17 MED ORDER — IOHEXOL 350 MG/ML SOLN
75.0000 mL | Freq: Once | INTRAVENOUS | Status: AC | PRN
  Administered 2023-10-17: 75 mL via INTRAVENOUS

## 2023-10-17 MED ORDER — EZETIMIBE 10 MG PO TABS
10.0000 mg | ORAL_TABLET | Freq: Every day | ORAL | Status: DC
  Administered 2023-10-17: 10 mg via ORAL
  Filled 2023-10-17: qty 1

## 2023-10-17 MED ORDER — ORAL CARE MOUTH RINSE: 15.0000 mL | OROMUCOSAL | Status: DC | PRN

## 2023-10-17 MED ORDER — IPRATROPIUM-ALBUTEROL 0.5-2.5 (3) MG/3ML IN SOLN: 3.0000 mL | Freq: Four times a day (QID) | RESPIRATORY_TRACT | Status: DC | PRN

## 2023-10-17 MED ORDER — FUROSEMIDE 10 MG/ML IJ SOLN
40.0000 mg | Freq: Once | INTRAMUSCULAR | Status: AC
  Administered 2023-10-17: 40 mg via INTRAVENOUS
  Filled 2023-10-17: qty 4

## 2023-10-17 MED ORDER — ISOSORBIDE MONONITRATE ER 30 MG PO TB24: 60.0000 mg | ORAL_TABLET | Freq: Every day | ORAL | Status: DC

## 2023-10-17 MED ORDER — METHADONE HCL 5 MG PO TABS
15.0000 mg | ORAL_TABLET | Freq: Three times a day (TID) | ORAL | Status: DC
  Filled 2023-10-17: qty 1

## 2023-10-17 MED ORDER — ALLOPURINOL 300 MG PO TABS
300.0000 mg | ORAL_TABLET | Freq: Every day | ORAL | Status: DC
  Administered 2023-10-17 – 2023-10-20 (×4): 300 mg via ORAL
  Filled 2023-10-17 (×4): qty 1

## 2023-10-17 MED ORDER — NITROGLYCERIN 2 % TD OINT
1.0000 [in_us] | TOPICAL_OINTMENT | Freq: Once | TRANSDERMAL | Status: AC
  Administered 2023-10-17: 1 [in_us] via TOPICAL
  Filled 2023-10-17: qty 1

## 2023-10-17 MED ORDER — NITROGLYCERIN IN D5W 200-5 MCG/ML-% IV SOLN: 0.0000 ug/min | INTRAVENOUS | Status: DC

## 2023-10-17 MED ORDER — OXYCODONE HCL 5 MG PO TABS
5.0000 mg | ORAL_TABLET | Freq: Four times a day (QID) | ORAL | Status: DC | PRN
  Administered 2023-10-17 – 2023-10-20 (×8): 5 mg via ORAL
  Filled 2023-10-17 (×8): qty 1

## 2023-10-17 MED ORDER — METHADONE HCL 5 MG PO TABS: 15.0000 mg | ORAL_TABLET | Freq: Three times a day (TID) | ORAL | Status: DC

## 2023-10-17 MED ORDER — FLUTICASONE PROPIONATE 50 MCG/ACT NA SUSP
1.0000 | Freq: Every day | NASAL | Status: DC
  Administered 2023-10-17 – 2023-10-20 (×4): 1 via NASAL
  Filled 2023-10-17: qty 16

## 2023-10-17 MED ORDER — ASPIRIN 81 MG PO TBEC
81.0000 mg | DELAYED_RELEASE_TABLET | Freq: Every day | ORAL | Status: DC
  Administered 2023-10-17 – 2023-10-20 (×4): 81 mg via ORAL
  Filled 2023-10-17 (×4): qty 1

## 2023-10-17 MED ORDER — POTASSIUM CHLORIDE 10 MEQ/100ML IV SOLN
10.0000 meq | INTRAVENOUS | Status: AC
  Administered 2023-10-17 (×2): 10 meq via INTRAVENOUS
  Filled 2023-10-17 (×2): qty 100

## 2023-10-17 MED ORDER — MEDROXYPROGESTERONE ACETATE 10 MG PO TABS
10.0000 mg | ORAL_TABLET | Freq: Every day | ORAL | Status: DC
  Administered 2023-10-17 – 2023-10-20 (×4): 10 mg via ORAL
  Filled 2023-10-17 (×4): qty 1

## 2023-10-17 MED ORDER — ENOXAPARIN SODIUM 40 MG/0.4ML IJ SOSY
40.0000 mg | PREFILLED_SYRINGE | INTRAMUSCULAR | Status: DC
  Administered 2023-10-17 – 2023-10-20 (×4): 40 mg via SUBCUTANEOUS
  Filled 2023-10-17 (×4): qty 0.4

## 2023-10-17 MED ORDER — VITAMIN D 25 MCG (1000 UNIT) PO TABS
1000.0000 [IU] | ORAL_TABLET | Freq: Every day | ORAL | Status: DC
  Administered 2023-10-17 – 2023-10-20 (×4): 1000 [IU] via ORAL
  Filled 2023-10-17 (×4): qty 1

## 2023-10-17 MED ORDER — POTASSIUM CHLORIDE CRYS ER 20 MEQ PO TBCR
40.0000 meq | EXTENDED_RELEASE_TABLET | Freq: Once | ORAL | Status: AC
  Administered 2023-10-17: 40 meq via ORAL
  Filled 2023-10-17: qty 2

## 2023-10-17 NOTE — Assessment & Plan Note (Addendum)
Likely due to running out of lasix and cocaine use. Most recent echo 09/24, EF 30-35%. Takes 40mg  lasix PO at home. S/p 40mg  IV lasix. Improving respiratory status with transition off BiPAP to supplemental oxygen via [place] in the ED. Dry weight 110kg? --admit to FMTS, progressive, Dr Miquel Dunn attending --continue supplemental oxygen, wean as tolerated --continuous cardiac monitoring --strict I/Os --daily weights --vitals per unit --Echo tomorrow AM --f/u UDS --COWs q4hr --f/u TSH --PT/OT/SLP eval and treat -- IV Lasix -redose in PM  -- AM CBC/CMP/Mag

## 2023-10-17 NOTE — H&P (Signed)
Hospital Admission History and Physical Service Pager: [phone]  Patient name: [name] Medical record number: [id number] Date of Birth: [birth year] Age: 62 y.o. Gender: female  Primary Care Provider: Darral Dash, DO Consultants: None Code Status: FULL Preferred Emergency Contact:  Contact Information     Name Relation Home Work Mobile   Rodriguez,[name] Significant other   [phone]      Other Contacts   None on File      Chief Complaint: Dyspnea  Assessment and Plan: AHNNA TREVETT is a 62 y.o. female presenting with dyspnea, chest pain, and hypertension . Differential for this patient's presentation of this includes   --Acute CHF exacerbation: fits best with clinical picture (orthopnea, pulmonary crackles, elevated BNP, pulmonary edema on CXR, etc), most likely triggered by underdiuresis for the past few days in setting of lack of adherence to lasix plus cocaine use --PE: possible given tachypnea, ?pleuritic chest pain, and hx of malignancy with possible current EIN based on recent gyn onc note although last EMB not concerning for dysplasia --CTA negative  --Pneumothorax: unlikely as this was not seen on CXR and oxygenation status improving from BiPAP to [place] --ACS: possible with rise in troponin 24 > 72 but more likely that this is demand ischemia, no acute changes on EKG --COPD exacerbation: pt does not have COPD diagnosis but has 10 year hx of tobacco use plus dyspnea and brown sputum production, less likely given presentation  Assessment & Plan Acute on chronic diastolic heart failure (HCC) Likely due to running out of lasix and cocaine use. Most recent echo 09/24, EF 30-35%. Takes 40mg  lasix PO at home. S/p 40mg  IV lasix. Improving respiratory status with transition off BiPAP to supplemental oxygen via [place] in the ED. Dry weight 110kg? --admit to FMTS, progressive, Dr Miquel Dunn attending --continue supplemental oxygen, wean as tolerated --continuous cardiac  monitoring --strict I/Os --daily weights --vitals per unit --Echo tomorrow AM --f/u UDS --COWs q4hr --f/u TSH --PT/OT/SLP eval and treat -- IV Lasix -redose in PM  -- AM CBC/CMP/Mag Chest pain Does not sound like anginal pain. Could possibly be pleuritic based on description. Given tachypnea, will image to rule out PE. More likely due to CHF exacerbation. Trending trops, repeat EKG reassuring.  --CT angio PE --continue to monitor HTN (hypertension) Blood pressure as high as 174/161 on admission without significant improvement after nitroglycerin paste. Appears to be 130s/80s-90s at recent clinic visits. After discussion with CCM given heart failure exacerbation in setting of abnormally high pressures, decided to monitor and could consider hydralazine if indicated. However, cautious as she has a history of cocaine use recently.  Abnormal LFTs Elevation in LFT AST 182, ALT 135. Liver injury in setting of heart failure, will check hepatitis panel. Alk phos normalized.  Electrolyte abnormality Hypokalemia, replete as indicated.     Chronic and Stable Conditions: Cocaine use: TOC for substance use counseling. COWs as patient also has a history of opioid use disorder. UDS ordered.  H/o EIN with squamous metaplasia, following with Dr. Pricilla Holm. EMB on 04/14/23: benign inactive endometrium, no hyperplasia or malignancy. On Progesterone for EIN. Started home dose  CADH/o MI in 2008: ASA home dose  H/o HLD: LDL 115 from 2023-Restarted Zetia  Lymphedema  Methadone use for Chronic Pain: Follows with palliative care for Methadone 15 mg q8hrs, TED hose. Holding Methadone presently with EKG QT 501. Restart as able  H/o Breast Cancer 2012 H/o thrombocytosis - Monitor CBC  Prediabetes -CBG on BMPs  FEN/GI: Heart Healthy VTE Prophylaxis: Lovenox   Disposition: Progressive   History of Present Illness:  [name] is a 62 y.o. female presenting with dyspnea and cough producing sputum  "brown." Reports that she snorted cocaine a few days ago and drank alcohol because there was a homecoming game this weekend. Eating and drinking normally. Notes that the symptoms have been going on over last couple of days.   Also states she was having chest pain before today. Describes it as as "poking" sensation. Was having some pain with inspiration prior to today.  Has been out of her Lasix and Jardiance for a couple of days. Does not wear CPAP at home.   Has been taking her blood pressure medications.   In the ED, had CXR that showed vascular congestion, received Lasix 40mg  IV and nitroglycerin paste for elevated BP.   Review Of Systems: Per HPI with the following additions: No fever or chills, + loose BM  Pertinent Past Medical History: HTN CHF 30% EF CAD HLD Lymphedema  H/o breast cancer  Thrombocytosis  H/o STEMI  H/o EIN  Prediabetes  Cocaine use  Venous stasis  Remainder reviewed in history tab.   Pertinent Past Surgical History:  Lumpectomy and mastectomy  TKA  Hernia repair   Remainder reviewed in history tab.  Pertinent Social History: Tobacco use: Yes- 4 a day for 10 years  Alcohol use: Occasionally, drank a bit over the weekend  Other Substance use: Snorted cocaine a few days ago Lives with [facility]   Pertinent Family History:  Mother- heart failure, MI Father - heart failure, MI   Remainder reviewed in history tab.   Important Outpatient Medications: Allopurinol Vitamin C Coreg ASA 81mg  Jardiance 10 mg daily  Zetia 10 mg daily  Flonase  Lasix 40 mg daily  Imdur Methadone  Provera Nitrostat [place] Con  Remainder reviewed in medication history.   Objective: BP 133/75 (BP Location: Right Leg)   Pulse 90   Temp 97.9 F (36.6 C) (Oral)   Resp 20   Wt 96.6 kg   LMP 08/07/2011   SpO2 95%   BMI 35.45 kg/m  Exam: General: Alert and oriented in no apparent distress, bipap in place Head: [place]/AT.   Eyes:  EOMI Nose:  Septum midline  Heart:  Regular rate and rhythm with no murmurs appreciated Lungs: CTA bilaterally, no wheezing Abdomen: Bowel sounds present, no abdominal pain Skin: Warm and dry Extremities: No lower extremity edema Derm: No rash Neuro: CN III, IV,VI: EOMI CVII: Symmetric brow raise Normal sensation in UE and LE bilaterally  Psych: Normal affect and mood   Labs:  CBC BMET  Recent Labs  Lab 10/16/23 2335 10/16/23 2357  WBC 11.8*  --   HGB 14.9 16.7*  HCT 48.8* 49.0*  PLT 567*  --    Recent Labs  Lab 10/17/23 0306  NA 141  K 3.2*  CL 108  CO2 21*  BUN 15  CREATININE 0.90  GLUCOSE 117*  CALCIUM 8.7*    Trop 24>72, TSH nml  EKG: QT 501, No acute ST segment changes, wide QRS, sinus rhythm    Imaging Studies Performed:  CT Angio Chest Pulmonary Embolism (PE) W or WO Contrast  Result Date: 10/17/2023 IMPRESSION: 1. Negative for central or lobar pulmonary embolism. The segmental and subsegmental branches are obscured by respiratory motion. 2. Cardiomegaly.  Three-vessel coronary artery calcification. Aortic Atherosclerosis (ICD10-I70.0).   DG Chest Port 1 View  Result Date: 10/17/2023 IMPRESSION: Findings suggestive of  CHF.     Alfredo Martinez, MD 10/17/2023, 4:09 AM PGY-3, [facility]  FPTS Intern pager: [phone], text pages welcome Secure chat group [facility] Teaching Service

## 2023-10-17 NOTE — Plan of Care (Signed)
  Problem: Education: Goal: Ability to demonstrate management of disease process will improve Outcome: Not Progressing   Problem: Activity: Goal: Capacity to carry out activities will improve Outcome: Not Progressing   Problem: Cardiac: Goal: Ability to achieve and maintain adequate cardiopulmonary perfusion will improve Outcome: Not Progressing

## 2023-10-17 NOTE — Evaluation (Signed)
Occupational Therapy Evaluation Patient Details Name: [name] MRN: [id number] DOB: 1961/07/23 Today's Date: 10/17/2023   History of Present Illness Pt is a 62 y/o female presenting with dyspnea, chest pain and HTN in setting of acute on chronic CHF exacerbation. Recent cocaine and etoh use. PMHx:  CAD s/p stent in 2008 (BMS to RCA, DES to LAD in '09), tobacco and cocaine use, HTN, and obesity.   Clinical Impression   PTA, pt lives with spouse (currently out of the country), typically ambulatory with a cane and receives daily aide assist for showering and IADLs. Pt presents now very restless and impulsive with movements. Pt requires Supervision for bed mobility, Min A progressing to CGA for BSC transfers and CGA for ADLs assessed. Pt endorsed dizziness with movement with BP 156/88. Pending acute progression, anticipate pt will be able to DC home with HHOT follow up.   SpO2 95% on RA       If plan is discharge home, recommend the following: A little help with bathing/dressing/bathroom;Assistance with cooking/housework;Direct supervision/assist for financial management;Direct supervision/assist for medications management;Assist for transportation    Functional Status Assessment  Patient has had a recent decline in their functional status and demonstrates the ability to make significant improvements in function in a reasonable and predictable amount of time.  Equipment Recommendations  None recommended by OT    Recommendations for Other Services       Precautions / Restrictions Precautions Precautions: Fall;Other (comment) Precaution Comments: monitor O2 Restrictions Weight Bearing Restrictions: No      Mobility Bed Mobility Overal bed mobility: Needs Assistance Bed Mobility: Supine to Sit, Sit to Supine     Supine to sit: Supervision Sit to supine: Supervision        Transfers Overall transfer level: Needs assistance Equipment used: None Transfers: Bed to  chair/wheelchair/BSC, Sit to/from Stand Sit to Stand: Supervision     Step pivot transfers: Contact guard assist, Min assist     General transfer comment: Min A to pivot to [facility] with IV line w/ pt reaching to armrests, progressed to CGA on return back to bed and multiple stands from bedside and BSC      Balance Overall balance assessment: Needs assistance, History of Falls Sitting-balance support: No upper extremity supported, Feet supported Sitting balance-Leahy Scale: Good     Standing balance support: No upper extremity supported, During functional activity, Single extremity supported Standing balance-Leahy Scale: Fair                             ADL either performed or assessed with clinical judgement   ADL Overall ADL's : Needs assistance/impaired Eating/Feeding: Independent   Grooming: Contact guard assist;Standing   Upper Body Bathing: Set up   Lower Body Bathing: Contact guard assist;Sit to/from stand;Sitting/lateral leans   Upper Body Dressing : Set up   Lower Body Dressing: Contact guard assist;Sit to/from stand;Sitting/lateral leans   Toilet Transfer: Minimal assistance;Contact guard assist;Stand-pivot;BSC/3in1 Toilet Transfer Details (indicate cue type and reason): cues for safe sequencing considering IV line. quick with movements Toileting- Clothing Manipulation and Hygiene: Contact guard assist;Sitting/lateral lean;Sit to/from stand Toileting - Clothing Manipulation Details (indicate cue type and reason): performing hygine seated, standing and quickly       General ADL Comments: Reporting dizziness with movement but vitals overall WFL, able to tirate to RA     Vision Ability to See in Adequate Light: 0 Adequate Patient Visual Report: No change from baseline  Vision Assessment?: No apparent visual deficits     Perception         Praxis         Pertinent Vitals/Pain Pain Assessment Pain Assessment: No/denies pain     Extremity/Trunk  Assessment Upper Extremity Assessment Upper Extremity Assessment: Overall WFL for tasks assessed;Right hand dominant   Lower Extremity Assessment Lower Extremity Assessment: Defer to PT evaluation   Cervical / Trunk Assessment Cervical / Trunk Assessment: Normal   Communication Communication Communication: No apparent difficulties Cueing Techniques: Verbal cues   Cognition Arousal: Alert Behavior During Therapy: Restless, Anxious, Impulsive Overall Cognitive Status: No family/caregiver present to determine baseline cognitive functioning                                 General Comments: very restless, impulsive and hyperverbose though pleasant. follows directions consistently, benefits from safety cues intermittently. attention deficits - may be close to baseline?     General Comments  O2 97% on 2 L O2, 95% on RA. BP 150s/80s post activity    Exercises     Shoulder Instructions      Home Living Family/patient expects to be discharged to:: Private residence Living Arrangements: Spouse/significant other Available Help at Discharge: Family;Friend(s);Personal care attendant;Available PRN/intermittently Type of Home: House Home Access: Stairs to enter [employer] of Steps: 2   Home Layout: One level     Bathroom Shower/Tub: Producer, television/film/video: Handicapped height     Home Equipment: Agricultural consultant (2 wheels);Cane - single point;Shower seat - built in;BSC/3in1   Additional Comments: Pt reports fiance is in [place] for a few weeks handling property business      Prior Functioning/Environment Prior Level of Function : Independent/Modified Independent;Needs assist             Mobility Comments: typically uses cane for mobility, reports multiple falls but unsure of cause ADLs Comments: reports aide assist daily for 2 hours in the morning, she assists with "everything" (showering, meals). Pt reports able to manage other  ADLs when aide not present. reports using [employer]'s "[employer]" for transporation to appointments, etc        OT Problem List: Decreased activity tolerance;Impaired balance (sitting and/or standing);Decreased cognition;Decreased safety awareness;Decreased knowledge of use of DME or AE      OT Treatment/Interventions: Self-care/ADL training;Therapeutic exercise;Energy conservation;DME and/or AE instruction;Therapeutic activities;Patient/family education;Balance training    OT Goals(Current goals can be found in the care plan section) Acute Rehab OT Goals Patient Stated Goal: resolve dizziness, feel better soon OT Goal Formulation: With patient Time For Goal Achievement: 10/31/23 Potential to Achieve Goals: Good ADL Goals Pt Will Perform Lower Body Dressing: with modified independence;sit to/from stand Pt Will Transfer to Toilet: with modified independence;ambulating Additional ADL Goal #1: Pt to verbalize at least 3 fall prevention strategies to implement at home  OT Frequency: Min 1X/week    Co-evaluation              AM-PAC OT "6 Clicks" Daily Activity     Outcome Measure Help from another person eating meals?: None Help from another person taking care of personal grooming?: A Little Help from another person toileting, which includes using toliet, bedpan, or urinal?: A Little Help from another person bathing (including washing, rinsing, drying)?: A Little Help from another person to put on and taking off regular upper body clothing?: A Little Help from another person to put on and  taking off regular lower body clothing?: A Little 6 Click Score: 19   End of Session Equipment Utilized During Treatment: Oxygen Nurse Communication: Mobility status  Activity Tolerance: Patient tolerated treatment well Patient left: in bed;with call bell/phone within reach;with bed alarm set  OT Visit Diagnosis: Unsteadiness on feet (R26.81);Other abnormalities of gait and mobility (R26.89);Muscle  weakness (generalized) (M62.81)                Time: 5284-1324 OT Time Calculation (min): 24 min Charges:  OT General Charges $OT Visit: 1 Visit OT Evaluation $OT Eval Moderate Complexity: 1 Mod OT Treatments $Self Care/Home Management : 8-22 mins  Bradd Canary, OTR/L Acute Rehab Services Office: [phone]   Lorre Munroe 10/17/2023, 8:52 AM

## 2023-10-17 NOTE — Assessment & Plan Note (Signed)
Hypokalemia, replete as indicated.

## 2023-10-17 NOTE — Progress Notes (Signed)
   10/17/23 1500  Spiritual Encounters  Type of Visit Attempt (pt unavailable)   Chaplain attempted to visit patient but she was sleeping. Chaplain will follow-up.

## 2023-10-17 NOTE — ED Notes (Signed)
Completed bed linen and gown change / repositioned on bed , adult brief applied .

## 2023-10-17 NOTE — Plan of Care (Signed)
  Problem: Education: Goal: Ability to demonstrate management of disease process will improve Outcome: Progressing Goal: Ability to verbalize understanding of medication therapies will improve Outcome: Progressing Goal: Individualized Educational Video(s) Outcome: Progressing   Problem: Activity: Goal: Capacity to carry out activities will improve Outcome: Progressing   Problem: Cardiac: Goal: Ability to achieve and maintain adequate cardiopulmonary perfusion will improve Outcome: Progressing   Problem: Education: Goal: Knowledge of General Education information will improve Description: Including pain rating scale, medication(s)/side effects and non-pharmacologic comfort measures Outcome: Progressing   Problem: Health Behavior/Discharge Planning: Goal: Ability to manage health-related needs will improve Outcome: Progressing   Problem: Clinical Measurements: Goal: Ability to maintain clinical measurements within normal limits will improve Outcome: Progressing Goal: Will remain free from infection Outcome: Progressing Goal: Diagnostic test results will improve Outcome: Progressing Goal: Respiratory complications will improve Outcome: Progressing Goal: Cardiovascular complication will be avoided Outcome: Progressing   Problem: Activity: Goal: Risk for activity intolerance will decrease Outcome: Progressing   Problem: Nutrition: Goal: Adequate nutrition will be maintained Outcome: Progressing   Problem: Coping: Goal: Level of anxiety will decrease Outcome: Progressing   Problem: Elimination: Goal: Will not experience complications related to bowel motility Outcome: Progressing Goal: Will not experience complications related to urinary retention Outcome: Progressing   Problem: Pain Management: Goal: General experience of comfort will improve Outcome: Progressing   Problem: Safety: Goal: Ability to remain free from injury will improve Outcome: Progressing    Problem: Skin Integrity: Goal: Risk for impaired skin integrity will decrease Outcome: Progressing

## 2023-10-17 NOTE — ED Notes (Signed)
RT at bedside , applied O2 nasal canula . 1st run of Kcl IV infusing .

## 2023-10-17 NOTE — ED Notes (Signed)
EDP notified on patient's hypertension .  

## 2023-10-17 NOTE — Evaluation (Signed)
Physical Therapy Evaluation Patient Details Name: [name] MRN: [id number] DOB: Apr 10, 1961 Today's Date: 10/17/2023  History of Present Illness  Pt is a 62 y/o female presenting with dyspnea, chest pain and HTN in setting of acute on chronic CHF exacerbation. Recent cocaine and etoh use. PMHx:  CAD s/p stent in 2008 (BMS to RCA, DES to LAD in '09), tobacco and cocaine use, HTN, and obesity.  Clinical Impression  Pt admitted with/for the above symptoms but generally feeling like not able to breathe.  Pt not at baseline function, needing CGA to min assist for mobility up OOB..  Pt currently limited functionally due to the problems listed below.  (see problems list.)  Pt will benefit from PT to maximize function and safety to be able to get home safely with available assist.         If plan is discharge home, recommend the following: A little help with walking and/or transfers;A little help with bathing/dressing/bathroom;Assistance with cooking/housework;Assist for transportation;Help with stairs or ramp for entrance   Can travel by private vehicle        Equipment Recommendations Other (comment) (TBD)  Recommendations for Other Services       Functional Status Assessment Patient has had a recent decline in their functional status and demonstrates the ability to make significant improvements in function in a reasonable and predictable amount of time.     Precautions / Restrictions Precautions Precautions: Fall;Other (comment) Precaution Comments: monitor O2 Restrictions Weight Bearing Restrictions: No      Mobility  Bed Mobility Overal bed mobility: Needs Assistance Bed Mobility: Supine to Sit, Sit to Supine     Supine to sit: Supervision Sit to supine: Supervision        Transfers Overall transfer level: Needs assistance Equipment used: None Transfers: Bed to chair/wheelchair/BSC, Sit to/from Stand Sit to Stand: Supervision   Step pivot transfers: Contact  guard assist, Min assist       General transfer comment: pt mildly unsteady, a bit dizzy.  She's asking for steadying assist.  Needing minimal assist.    Ambulation/Gait Ambulation/Gait assistance: Min assist Gait Distance (Feet): 15 Feet Assistive device: 1 person hand held assist Gait Pattern/deviations: Step-through pattern   Gait velocity interpretation: <1.31 ft/sec, indicative of household ambulator   General Gait Details: mildly unsteady overall, needing minimal HHA for stabiliity.  Stairs            Wheelchair Mobility     Tilt Bed    Modified Rankin (Stroke Patients Only)       Balance Overall balance assessment: Needs assistance, History of Falls Sitting-balance support: No upper extremity supported, Feet supported Sitting balance-Leahy Scale: Good     Standing balance support: No upper extremity supported, During functional activity, Single extremity supported Standing balance-Leahy Scale: Fair Standing balance comment: this pm patient still could stand without support, but dynamically needed external support                             Pertinent Vitals/Pain      Home Living Family/patient expects to be discharged to:: Private residence Living Arrangements: Spouse/significant other Available Help at Discharge: Family;Friend(s);Personal care attendant;Available PRN/intermittently Type of Home: House Home Access: Stairs to enter   Entrance Stairs-Number of Steps: 2   Home Layout: One level Home Equipment: Agricultural consultant (2 wheels);Cane - single point;Shower seat - built in;BSC/3in1 Additional Comments: Pt reports fiance is in [place] for a few weeks  handling property business    Prior Function Prior Level of Function : Independent/Modified Independent;Needs assist             Mobility Comments: typically uses cane for mobility, reports multiple falls but unsure of cause ADLs Comments: reports aide assist daily for 2  hours in the morning, she assists with "everything" (showering, meals). Pt reports able to manage other ADLs when aide not present. reports using [employer]'s "[employer]" for transporation to appointments, etc     Extremity/Trunk Assessment   Upper Extremity Assessment Upper Extremity Assessment: Overall WFL for tasks assessed    Lower Extremity Assessment Lower Extremity Assessment: Overall WFL for tasks assessed    Cervical / Trunk Assessment Cervical / Trunk Assessment: Normal  Communication   Communication Communication: No apparent difficulties  Cognition Arousal: Alert Behavior During Therapy: Restless, Impulsive (Looks anxious, but pt states not really anxious)                                   General Comments: pt is different than in the am.  Still unfocused, but not feeling well as if has crashed as far as Designer, industrial/product.        General Comments General comments (skin integrity, edema, etc.): VSS except BP taken from the calf were suspect at 156/108/(111). then 143/110 (115) respectively    Exercises     Assessment/Plan    PT Assessment Patient needs continued PT services  PT Problem List Decreased activity tolerance;Decreased balance;Decreased mobility;Other (comment);Cardiopulmonary status limiting activity;Decreased safety awareness       PT Treatment Interventions Gait training;Stair training;Functional mobility training;Therapeutic activities;Balance training;Patient/family education    PT Goals (Current goals can be found in the Care Plan section)  Acute Rehab PT Goals Patient Stated Goal: I want to feel better and be able to get home (safely) PT Goal Formulation: With patient Time For Goal Achievement: 10/31/23 Potential to Achieve Goals: Good    Frequency Min 1X/week     Co-evaluation               AM-PAC PT "6 Clicks" Mobility  Outcome Measure Help needed turning from your back to your side while in a flat bed without using bedrails?: A  Little Help needed moving from lying on your back to sitting on the side of a flat bed without using bedrails?: A Little Help needed moving to and from a bed to a chair (including a wheelchair)?: A Little Help needed standing up from a chair using your arms (e.g., wheelchair or bedside chair)?: A Little Help needed to walk in hospital room?: A Little Help needed climbing 3-5 steps with a railing? : A Little 6 Click Score: 18    End of Session   Activity Tolerance: Patient tolerated treatment well;Patient limited by fatigue Patient left: in bed;with call bell/phone within reach;with nursing/sitter in room Nurse Communication: Mobility status PT Visit Diagnosis: Unsteadiness on feet (R26.81)    Time: 1610-9604 PT Time Calculation (min) (ACUTE ONLY): 21 min   Charges:   PT Evaluation $PT Eval Moderate Complexity: 1 Mod   PT General Charges $$ ACUTE PT VISIT: 1 Visit         10/17/2023  Jacinto Halim., PT Acute Rehabilitation Services [phone]  (office)  Eliseo Gum Madisan Bice 10/17/2023, 5:35 PM

## 2023-10-17 NOTE — Progress Notes (Signed)
   10/17/23 0235  BiPAP/CPAP/SIPAP  Reason BIPAP/CPAP not in use  (Patient placed on [place] at this time per MD)   Weaned off per MD. Patient placed on 3L  at this time. No respiratory distress noted. BiPAP on standby at bedside.

## 2023-10-17 NOTE — Progress Notes (Signed)
FMTS Interim Progress Note  S: Patient was asleep in bed on evaluation this morning.  She awoke easily to voice and stated that she is having some ongoing chest pain consistent with what she was having on admission.  She denies any ongoing shortness of breath or other discomfort.  O: BP 133/75 (BP Location: Right Leg)   Pulse 90   Temp 97.9 F (36.6 C) (Oral)   Resp 20   Ht 5\' 5"  (1.651 m)   Wt 92.3 kg   LMP 08/07/2011   SpO2 95%   BMI 33.86 kg/m   Normal: Asleep, no obvious distress Cardiac: RRR, no M/R/G Respiratory: Bibasilar crackles, good air movement.  No increased work of breathing Extremities: 2+ peripheral pulses x 4, trace peripheral edema in the bilateral lower extremity  A/P: Repeat EKG shows no concerns for acute cardiovascular process, but her troponin rose to 133. We will continue to trend and consult cardiology. QTc is still prolonged to 549 ms.  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 Added on 5mg  of Oxy prn for pain.  Redose Lasix this afternoon and continue to monitor.  Gerrit Heck, DO 10/17/2023, 9:50 AM PGY-1, [facility] Family Medicine Service pager [phone]

## 2023-10-17 NOTE — Progress Notes (Signed)
PT Cancellation Note  Patient Details Name: [name] MRN: [id number] DOB: May 06, 1961   Cancelled Treatment:    Reason Eval/Treat Not Completed: Patient declined, no reason specified.  Pt stated not feeling well right now, come back later please. 10/17/2023  Jacinto Halim., PT Acute Rehabilitation Services [phone]  (office)   Eliseo Gum Urho Rio 10/17/2023, 3:35 PM

## 2023-10-17 NOTE — Hospital Course (Addendum)
[name] is a 62 y.o.female with a history of h/o breast cancer, CAD, CKD 2, gout, HTN, H/o MI, H/o pulm HTN who was admitted to the [facility] Teaching [facility] at [facility] for dyspnea. Her hospital course is detailed below:  Dyspnea  History of CHF  Presented to the ED after oxygen saturation was in the 70s and required Bipap administration with subsequent admission. CXR with evidence of CHF, increased interstitial markings and blunting of costophrenic angles. Able to quickly transition to RA after diuresis. Updated echo showed LVEF 40-45% with basal inferior LV wall hypokinesis. CTA negative for PE. IV diuresis continued through admission with net negative 1624.7 out and dry weight on discharge 92.1 kg.  Cardiology consulted and recommended starting Imdur and avoiding beta blockers, and ARBs due to rebound hypertension concern.  HTN  Severely elevated pressures on admission, responded to IV lasix and nitroglycerin paste initially. Did not require antihypertensives while admitted.  Other chronic conditions were medically managed with home medications and formulary alternatives as necessary   PCP Follow-up Recommendations: F/u with GYN-onc for recommended surgery Restart Imdur on discharge.

## 2023-10-17 NOTE — ED Notes (Signed)
Admitting MD at bedside.

## 2023-10-17 NOTE — Assessment & Plan Note (Signed)
Elevation in LFT AST 182, ALT 135. Liver injury in setting of heart failure, will check hepatitis panel. Alk phos normalized.

## 2023-10-17 NOTE — Assessment & Plan Note (Addendum)
Does not sound like anginal pain. Could possibly be pleuritic based on description. Given tachypnea, will image to rule out PE. More likely due to CHF exacerbation. Trending trops, repeat EKG reassuring.  --CT angio PE --continue to monitor
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
## 2023-10-17 NOTE — Assessment & Plan Note (Signed)
Blood pressure as high as 174/161 on admission without significant improvement after nitroglycerin paste. Appears to be 130s/80s-90s at recent clinic visits. After discussion with CCM given heart failure exacerbation in setting of abnormally high pressures, decided to monitor and could consider hydralazine if indicated. However, cautious as she has a history of cocaine use recently.

## 2023-10-17 NOTE — Assessment & Plan Note (Deleted)
Blood pressure as high as 174/161 on admission without significant improvement after nitroglycerin paste. Appears to be 130s/80s-90s at recent clinic visits.
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
## 2023-10-18 ENCOUNTER — Encounter (HOSPITAL_COMMUNITY): Payer: Self-pay | Admitting: Student

## 2023-10-18 ENCOUNTER — Other Ambulatory Visit (HOSPITAL_COMMUNITY): Payer: Self-pay

## 2023-10-18 DIAGNOSIS — I2510 Atherosclerotic heart disease of native coronary artery without angina pectoris: Secondary | ICD-10-CM | POA: Diagnosis not present

## 2023-10-18 DIAGNOSIS — R0781 Pleurodynia: Secondary | ICD-10-CM | POA: Diagnosis not present

## 2023-10-18 DIAGNOSIS — I5043 Acute on chronic combined systolic (congestive) and diastolic (congestive) heart failure: Secondary | ICD-10-CM | POA: Diagnosis not present

## 2023-10-18 LAB — CBC WITH DIFFERENTIAL/PLATELET
Abs Immature Granulocytes: 0.05 K/uL (ref 0.00–0.07)
Basophils Absolute: 0.1 K/uL (ref 0.0–0.1)
Basophils Relative: 1 %
Eosinophils Absolute: 0.2 K/uL (ref 0.0–0.5)
Eosinophils Relative: 2 %
HCT: 43.7 % (ref 36.0–46.0)
Hemoglobin: 14.1 g/dL (ref 12.0–15.0)
Immature Granulocytes: 1 %
Lymphocytes Relative: 25 %
Lymphs Abs: 2.4 K/uL (ref 0.7–4.0)
MCH: 25.8 pg — ABNORMAL LOW (ref 26.0–34.0)
MCHC: 32.3 g/dL (ref 30.0–36.0)
MCV: 79.9 fL — ABNORMAL LOW (ref 80.0–100.0)
Monocytes Absolute: 0.7 K/uL (ref 0.1–1.0)
Monocytes Relative: 8 %
Neutro Abs: 6 K/uL (ref 1.7–7.7)
Neutrophils Relative %: 63 %
Platelets: 471 K/uL — ABNORMAL HIGH (ref 150–400)
RBC: 5.47 MIL/uL — ABNORMAL HIGH (ref 3.87–5.11)
RDW: 16.3 % — ABNORMAL HIGH (ref 11.5–15.5)
WBC: 9.3 K/uL (ref 4.0–10.5)
nRBC: 0 % (ref 0.0–0.2)

## 2023-10-18 LAB — BASIC METABOLIC PANEL
Anion gap: 11 (ref 5–15)
Anion gap: 11 (ref 5–15)
BUN: 11 mg/dL (ref 6–20)
BUN: 14 mg/dL (ref 6–20)
CO2: 21 mmol/L — ABNORMAL LOW (ref 22–32)
CO2: 22 mmol/L (ref 22–32)
Calcium: 8.9 mg/dL (ref 8.9–10.3)
Calcium: 9 mg/dL (ref 8.9–10.3)
Chloride: 109 mmol/L (ref 98–111)
Chloride: 108 mmol/L (ref 98–111)
Creatinine, Ser: 0.99 mg/dL (ref 0.44–1.00)
Creatinine, Ser: 0.97 mg/dL (ref 0.44–1.00)
GFR, Estimated: >60 mL/min (ref >60)
GFR, Estimated: >60 mL/min (ref >60)
Glucose, Bld: 115 mg/dL — ABNORMAL HIGH (ref 70–99)
Glucose, Bld: 108 mg/dL — ABNORMAL HIGH (ref 70–99)
Potassium: 3 mmol/L — ABNORMAL LOW (ref 3.5–5.1)
Potassium: 3.3 mmol/L — ABNORMAL LOW (ref 3.5–5.1)
Sodium: 141 mmol/L (ref 135–145)
Sodium: 141 mmol/L (ref 135–145)

## 2023-10-18 LAB — HEPATIC FUNCTION PANEL
ALT: 96 U/L — ABNORMAL HIGH (ref 0–44)
AST: 59 U/L — ABNORMAL HIGH (ref 15–41)
Albumin: 3.1 g/dL — ABNORMAL LOW (ref 3.5–5.0)
Alkaline Phosphatase: 94 U/L (ref 38–126)
Bilirubin, Direct: <0.1 mg/dL (ref 0.0–0.2)
Total Bilirubin: 0.2 mg/dL — ABNORMAL LOW (ref 0.3–1.2)
Total Protein: 6 g/dL — ABNORMAL LOW (ref 6.5–8.1)

## 2023-10-18 MED ORDER — POTASSIUM CHLORIDE 10 MEQ/100ML IV SOLN
10.0000 meq | INTRAVENOUS | Status: AC
  Administered 2023-10-18 (×6): 10 meq via INTRAVENOUS
  Filled 2023-10-18 (×5): qty 100

## 2023-10-18 MED ORDER — POTASSIUM CHLORIDE 10 MEQ/100ML IV SOLN
INTRAVENOUS | Status: AC
  Filled 2023-10-18: qty 100

## 2023-10-18 MED ORDER — POTASSIUM CHLORIDE 20 MEQ PO PACK
60.0000 meq | PACK | Freq: Once | ORAL | Status: AC
  Administered 2023-10-18: 60 meq via ORAL
  Filled 2023-10-18: qty 3

## 2023-10-18 MED ORDER — EZETIMIBE 10 MG PO TABS
10.0000 mg | ORAL_TABLET | Freq: Every day | ORAL | Status: DC
  Administered 2023-10-18 – 2023-10-20 (×3): 10 mg via ORAL
  Filled 2023-10-18 (×3): qty 1

## 2023-10-18 NOTE — Progress Notes (Signed)
Daily Progress Note Intern Pager: [phone]  Patient name: [name] Medical record number: [id number] Date of birth: [birth year] Age: 62 y.o. Gender: female  Primary Care Provider: Darral Dash, DO Consultants: Cardiology Code Status: Full  Pt Overview and Major Events to Date:  10/28: Admitted to FMTS  Assessment and Plan:  Loredana Thompsen is a 62 year old lady with a history of chronic diastolic heart failure, hypertension and cocaine use admitted for acute on chronic heart failure in the setting of medication noncompliance and cocaine use.  Her blood pressures today are normal and her chest pain is reduced.  Cardiology is consulted and following. Assessment & Plan Acute on chronic diastolic heart failure (HCC) Patient appears to be tolerating diuresis well.  Echo shows improved function when compared to her previous study done on September 17.  Cardiology is on board and following see their recommendations below. -Continue with IV diuresis -Restart antihypertensive agent such as Imdur if needed -Could consider ARB but patient has been noncompliant previously -Would not recommend beta-blocker at this time -Strict I's and O's -Patient tolerating room air at this time -AM CMP Chest pain CT angio did not show central or lobar PE, could not rule out segmental or subsegmental PE.  Patient is still reporting some chest pain but reports that it is improving.  Tropes did continue to rise yesterday but did peak at 130.  Cardiology is consulted and following see above further recommendations. --continue to monitor -Added K-pad for likely chest wall pain HTN (hypertension) (Resolved: 10/18/2023) Resolved Abnormal LFTs Recheck CMP in a.m., hepatitis panel negative. Electrolyte abnormality Patient remains hypokalemic, will continue repletion as appropriate.   Chronic and Stable Problems:  Cocaine use: TOC consulted, COWS.  UDS positive only for cocaine H/oh EIN with squamous  metaplasia: Following with Dr. Pricilla Holm, continue home progesterone CAD: ASA home dose HLD: Restarted Zetia Lymphedema: Patient is taking oxycodone in the hospital Thrombocytosis: Monitor CBC Prediabetes: CBG on BMPs   FEN/GI: Heart healthy PPx: lovenox Dispo:Home pending clinical improvement .   Subjective:  Patient resting comfortably in bed on evaluation this afternoon.  She reports that she is still having some ongoing chest pain but it is less bothersome than when she first arrived.  She says that her legs are tender but the TED hose are helping and overall she is feeling well.  Objective: Temp:  [97.9 F (36.6 C)-98.4 F (36.9 C)] 98.3 F (36.8 C) (10/29 0504) Pulse Rate:  [86-96] 86 (10/29 0504) Resp:  [17-19] 17 (10/29 0504) BP: (118-154)/(80-98) 120/84 (10/29 0504) SpO2:  [94 %-100 %] 99 % (10/29 0504) Weight:  [92.8 kg] 92.8 kg (10/29 0504) Physical Exam: General: Alert, oriented.  No obvious distress Cardiovascular: RRR, no M/R/G Respiratory: CTAB, no increased work of breathing Abdomen: Flat, soft, nontender. Extremities: 2+ peripheral pulses in all 4 extremities.  Left arm with known lymphedema.  Lateral lower extremity with trace edema well-controlled with TED hose in place.  Laboratory: Most recent CBC Lab Results  Component Value Date   WBC 9.3 10/18/2023   HGB 14.1 10/18/2023   HCT 43.7 10/18/2023   MCV 79.9 (L) 10/18/2023   PLT 471 (H) 10/18/2023   Most recent BMP    Latest Ref Rng & Units 10/18/2023    3:53 AM  BMP  Glucose 70 - 99 mg/dL 644   BUN 6 - 20 mg/dL 11   Creatinine 0.34 - 1.00 mg/dL 7.42   Sodium 595 - 638 mmol/L 141   Potassium  3.5 - 5.1 mmol/L 3.0   Chloride 98 - 111 mmol/L 109   CO2 22 - 32 mmol/L 21   Calcium 8.9 - 10.3 mg/dL 8.9     Gerrit Heck, DO 10/18/2023, 7:54 AM  PGY-1, [facility] FPTS Intern pager: [phone], text pages welcome Secure chat group [facility] Teaching Service

## 2023-10-18 NOTE — Assessment & Plan Note (Addendum)
CT angio did not show central or lobar PE, could not rule out segmental or subsegmental PE.  Patient is still reporting some chest pain but reports that it is improving.  Tropes did continue to rise yesterday but did peak at 130.  Cardiology is consulted and following see above further recommendations. --continue to monitor -Added K-pad for likely chest wall pain

## 2023-10-18 NOTE — Progress Notes (Signed)
Pt potassium 3.3. Hindel, MD notified.

## 2023-10-18 NOTE — Progress Notes (Signed)
Rounding Note    Patient Name: [name] Date of Encounter: 10/18/2023  [facility] Cardiologist: Thomasene Ripple, DO   Subjective   Having intermittent pleuritic chest pain this AM but reports that breathing overall feels better. Reports good urine output. Attempted med rec, see below.  Inpatient Medications    Scheduled Meds:  allopurinol  300 mg Oral Daily   aspirin EC  81 mg Oral Daily   cholecalciferol  1,000 Units Oral Daily   enoxaparin (LOVENOX) injection  40 mg Subcutaneous Q24H   ezetimibe  10 mg Oral Daily   fluticasone  1 spray Each Nare Daily   furosemide  40 mg Intravenous BID   medroxyPROGESTERone  10 mg Oral Daily   Continuous Infusions:  potassium chloride 10 mEq (10/18/23 0835)   PRN Meds: ipratropium-albuterol, mouth rinse, oxyCODONE   Vital Signs    Vitals:   10/17/23 1958 10/18/23 0025 10/18/23 0504 10/18/23 0810  BP: 124/80 132/83 120/84 123/86  Pulse: 88 93 86 95  Resp: 19 19 17 18   Temp: 98.4 F (36.9 C) 98.2 F (36.8 C) 98.3 F (36.8 C) 98.3 F (36.8 C)  TempSrc: Oral Oral Oral Oral  SpO2: 100% 94% 99% 96%  Weight:   92.8 kg   Height:        Intake/Output Summary (Last 24 hours) at 10/18/2023 0905 Last data filed at 10/18/2023 0603 Gross per 24 hour  Intake 240 ml  Output 1000 ml  Net -760 ml      10/18/2023    5:04 AM 10/17/2023    4:11 AM 10/16/2023   11:29 PM  Last 3 Weights  Weight (lbs) 204 lb 9.4 oz 203 lb 7.8 oz 213 lb  Weight (kg) 92.8 kg 92.3 kg 96.616 kg      Telemetry    SR with occasional ectopy - Personally Reviewed  Physical Exam   GEN: No acute distress.   Neck: No JVD Cardiac: RRR, no murmurs, rubs, or gallops.  Respiratory: Clear to auscultation bilaterally. GI: Soft, nontender, non-distended  MS: No edema; No deformity. Neuro:  Nonfocal  Psych: Normal affect   New pertinent results (labs, ECG, imaging, cardiac studies)    Echo reviewed this admission  Patient Profile      62 y.o. female  with a hx of CAD with MI s/p BMS to RCA 2008, DES to LAD 2009, chronic HFrEF (reduced EF 08/2023 with moderate-severe mitral regurgitation), HTN, HLD, pre-DM, suspected CKD stage 2, obesity, tobacco abuse, chronic pain, cocaine use, aortic atherosclerosis, breast CA s/p bilateral mastectomy, possible uterine CA, medical nonadherence who is being seen 10/17/2023 for the evaluation of CHF at the request of Dr. Jena Gauss.   Assessment & Plan    Acute on chronic systolic and diastolic heart failure Medication nonadherence Cocaine use -on IV lasix. I/O not well captured. -Need to choose medications that are good for adherence and minimal risk if used intermittently. Given this, imdur is a good choice. Would not use beta blocker given withdrawal effect (even for nonselective agent such as carvedilol). She was not taking carvedilol at home. -blood pressure improved today. Considered ARB/MRA, but concern is for intermittent use and risk to renal function/potassium -was ordered for Jardiance as an outpatient. States this is new but was not taking yet. -she does not feel stable enough for discharge today. Encouraged ambulation  Hypokalemia -being repleted  CAD History of BMS RCA 2008, DES LAD 2009 -multiple stress tests showing prior infarct but no ischemia -reported  on aspirin prior to admission -atorvastatin listed on outside meds, also on zetia as outpatient. She reports that she was taken off the atorvastatin for unclear reasons.  Mitral regurgitation -reported as mild on most recent echo    Signed, Jodelle Red, MD  10/18/2023, 9:05 AM

## 2023-10-18 NOTE — Assessment & Plan Note (Addendum)
Patient appears to be tolerating diuresis well.  Echo shows improved function when compared to her previous study done on September 17.  Cardiology is on board and following see their recommendations below. -Continue with IV diuresis -Restart antihypertensive agent such as Imdur if needed -Could consider ARB but patient has been noncompliant previously -Would not recommend beta-blocker at this time -Strict I's and O's -Patient tolerating room air at this time -AM CMP

## 2023-10-18 NOTE — Assessment & Plan Note (Addendum)
Recheck CMP in a.m., hepatitis panel negative.

## 2023-10-18 NOTE — Assessment & Plan Note (Addendum)
Patient remains hypokalemic, will continue repletion as appropriate.
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
## 2023-10-18 NOTE — Plan of Care (Signed)
  Problem: Education: Goal: Ability to demonstrate management of disease process will improve Outcome: Progressing Goal: Ability to verbalize understanding of medication therapies will improve Outcome: Progressing Goal: Individualized Educational Video(s) Outcome: Progressing   Problem: Activity: Goal: Capacity to carry out activities will improve Outcome: Progressing   Problem: Cardiac: Goal: Ability to achieve and maintain adequate cardiopulmonary perfusion will improve Outcome: Progressing   Problem: Education: Goal: Knowledge of General Education information will improve Description: Including pain rating scale, medication(s)/side effects and non-pharmacologic comfort measures Outcome: Progressing   Problem: Health Behavior/Discharge Planning: Goal: Ability to manage health-related needs will improve Outcome: Progressing   Problem: Clinical Measurements: Goal: Ability to maintain clinical measurements within normal limits will improve Outcome: Progressing Goal: Will remain free from infection Outcome: Progressing Goal: Diagnostic test results will improve Outcome: Progressing Goal: Respiratory complications will improve Outcome: Progressing Goal: Cardiovascular complication will be avoided Outcome: Progressing   Problem: Activity: Goal: Risk for activity intolerance will decrease Outcome: Progressing   Problem: Nutrition: Goal: Adequate nutrition will be maintained Outcome: Progressing   Problem: Coping: Goal: Level of anxiety will decrease Outcome: Progressing   Problem: Elimination: Goal: Will not experience complications related to bowel motility Outcome: Progressing Goal: Will not experience complications related to urinary retention Outcome: Progressing   Problem: Pain Management: Goal: General experience of comfort will improve Outcome: Progressing   Problem: Safety: Goal: Ability to remain free from injury will improve Outcome: Progressing    Problem: Skin Integrity: Goal: Risk for impaired skin integrity will decrease Outcome: Progressing

## 2023-10-18 NOTE — Progress Notes (Signed)
Physical Therapy Treatment Patient Details Name: [name] MRN: [id number] DOB: Sep 26, 1961 Today's Date: 10/18/2023   History of Present Illness Pt is a 62 y/o female presenting with dyspnea, chest pain and HTN in setting of acute on chronic CHF exacerbation. Recent cocaine and etoh use. PMHx:  CAD s/p stent in 2008 (BMS to RCA, DES to LAD in '09), tobacco and cocaine use, HTN, and obesity.    PT Comments  Pt admitted with above diagnosis. Pt was able to ambulate without device with CGA.  Pt does have a RW she can use per pt if needed and used cane PTA. Pt should progress well.  Pt currently with functional limitations due to the deficits listed below (see PT Problem List). Pt will benefit from acute skilled PT to increase their independence and safety with mobility to allow discharge.       If plan is discharge home, recommend the following: A little help with walking and/or transfers;A little help with bathing/dressing/bathroom;Assistance with cooking/housework;Assist for transportation;Help with stairs or ramp for entrance   Can travel by private vehicle        Equipment Recommendations  Other (comment) (TBD)    Recommendations for Other Services       Precautions / Restrictions Precautions Precautions: Fall;Other (comment) Precaution Comments: monitor O2 Restrictions Weight Bearing Restrictions: No     Mobility  Bed Mobility Overal bed mobility: Needs Assistance Bed Mobility: Supine to Sit, Sit to Supine     Supine to sit: Supervision Sit to supine: Supervision        Transfers Overall transfer level: Needs assistance Equipment used: None Transfers: Bed to chair/wheelchair/BSC, Sit to/from Stand Sit to Stand: Supervision   Step pivot transfers: Contact guard assist, Min assist       General transfer comment: No assit to stand and transfer to the potty  chair    Ambulation/Gait Ambulation/Gait assistance: Contact guard assist Gait Distance (Feet):  100 Feet Assistive device: IV Pole Gait Pattern/deviations: Step-through pattern   Gait velocity interpretation: 1.31 - 2.62 ft/sec, indicative of limited community ambulator   General Gait Details: Pt slightly impulsive at times needing guard assist for safety but not LOB noted.  Pt wanted to hold onto IV pole.   Stairs             Wheelchair Mobility     Tilt Bed    Modified Rankin (Stroke Patients Only)       Balance Overall balance assessment: Needs assistance, History of Falls Sitting-balance support: No upper extremity supported, Feet supported Sitting balance-Leahy Scale: Good     Standing balance support: No upper extremity supported, During functional activity, Single extremity supported Standing balance-Leahy Scale: Fair Standing balance comment: patient still could stand statically without support, but dynamically needed external support                            Cognition Arousal: Alert Behavior During Therapy: Restless, Impulsive (Looks anxious, but pt states not really anxious) Overall Cognitive Status: No family/caregiver present to determine baseline cognitive functioning                                          Exercises      General Comments General comments (skin integrity, edema, etc.): VSS      Pertinent Vitals/Pain Pain Assessment Pain Assessment: No/denies pain  Home Living                          Prior Function            PT Goals (current goals can now be found in the care plan section) Acute Rehab PT Goals Patient Stated Goal: I want to feel better and be able to get home (safely) Progress towards PT goals: Progressing toward goals    Frequency    Min 1X/week      PT Plan      Co-evaluation              AM-PAC PT "6 Clicks" Mobility   Outcome Measure  Help needed turning from your back to your side while in a flat bed without using bedrails?: A Little Help  needed moving from lying on your back to sitting on the side of a flat bed without using bedrails?: A Little Help needed moving to and from a bed to a chair (including a wheelchair)?: A Little Help needed standing up from a chair using your arms (e.g., wheelchair or bedside chair)?: A Little Help needed to walk in hospital room?: A Little Help needed climbing 3-5 steps with a railing? : A Little 6 Click Score: 18    End of Session   Activity Tolerance: Patient tolerated treatment well;Patient limited by fatigue Patient left: in bed;with call bell/phone within reach Nurse Communication: Mobility status PT Visit Diagnosis: Unsteadiness on feet (R26.81)     Time: 1610-9604 PT Time Calculation (min) (ACUTE ONLY): 21 min  Charges:    $Gait Training: 8-22 mins PT General Charges $$ ACUTE PT VISIT: 1 Visit                     Ivory Bail M,PT Acute Rehab Services [phone]    Bevelyn Buckles 10/18/2023, 1:02 PM

## 2023-10-18 NOTE — Assessment & Plan Note (Addendum)
Resolved

## 2023-10-18 NOTE — Progress Notes (Signed)
   10/18/23 2105  BiPAP/CPAP/SIPAP  $ Non-Invasive Ventilator  Non-Invasive Vent Subsequent  BiPAP/CPAP/SIPAP Pt Type Adult  BiPAP/CPAP/SIPAP SERVO  Reason BIPAP/CPAP not in use Non-compliant (pt refused)  Mask Type Full face mask

## 2023-10-18 NOTE — Progress Notes (Signed)
   10/18/23 1539  Spiritual Encounters  Type of Visit Initial  Care provided to: Patient  Referral source Patient request  Reason for visit Routine spiritual support  OnCall Visit No  Spiritual Framework  Presenting Themes Values and beliefs  Values/beliefs belief in God, belief in prayer  Community/Connection Significant other  Patient Stress Factors Health changes  Family Stress Factors Not reviewed  Interventions  Spiritual Care Interventions Made Established relationship of care and support;Compassionate presence;Reflective listening;Prayer;Encouragement  Intervention Outcomes  Outcomes Connection to spiritual care;Awareness of support;Reduced isolation  Spiritual Care Plan  Spiritual Care Issues Still Outstanding No further spiritual care needs at this time (see row info)   Chaplain offered spiritual and emotional support through reflective listening, compassionate presence and prayer.   Arlyce Dice

## 2023-10-18 NOTE — Progress Notes (Signed)
   10/18/23 0013  BiPAP/CPAP/SIPAP  $ Non-Invasive Ventilator  Non-Invasive Vent Subsequent  BiPAP/CPAP/SIPAP Pt Type Adult  BiPAP/CPAP/SIPAP SERVO  Reason BIPAP/CPAP not in use Other(comment) (PT states does not want wear bipap for tonight. Np resp distress noted at this time. Bpap by bedside)

## 2023-10-18 NOTE — Progress Notes (Signed)
   Heart Failure Stewardship Pharmacist Progress Note   PCP: Darral Dash, DO PCP-Cardiologist: Thomasene Ripple, DO    HPI:  62 yo F with PMH of HFrEF, HTN, CAD (MI - BMS to RCA in 2008; MI - DES to LAD 2009), HLD, lymphedema, substance use (cocaine), venous stasis, and breast cancer.   Presented to the ED on 10/27 with shortness of breath and chest pressure. Reports she ran out of her lasix and Jardiance 2 days prior. Last use of cocaine was a few days before admission. CXR with stable cardiomegaly, pulmonary vascular congestion, and small L pleural effusion. CTA negative for PE. BNP 689.4. ECHO 10/28 showed LVEF 40-45%, RWMA, mild asymmetric LVH, RV normal, mild MR.   Cardiac testing: EF 60-65% 09/2019  Underwent stress test 12/2019 - intermediate risk EF 60-65% 03/2022  EF 55-60% 06/2022 EF 35-40% 08/2023 Underwent repeat stress test 08/2023 - no new areas of ischemia  Current HF Medications: Diuretic: furosemide 40 mg IV BID  Prior to admission HF Medications: Diuretic: furosemide 40 mg daily (or 60 mg daily PRN weight gain) Beta blocker: carvedilol 25 mg BID SGLT2i: Jardiance 10 mg daily  Pertinent Lab Values: Serum creatinine 0.99, BUN 11, Potassium 3.0, Sodium 141, BNP 689.4, Magnesium 2.0  Vital Signs: Weight: 204 lbs (admission weight: 203 lbs) Blood pressure: 120/80s  Heart rate: 80-90s  I/O: net -0.9L yesterday; net -1.2L since admission  Medication Assistance / Insurance Benefits Check: Does the patient have prescription insurance?  Yes Type of insurance plan: [place] Medicaid  Outpatient Pharmacy:  Prior to admission outpatient pharmacy: [facility] Is the patient willing to use [facility] TOC pharmacy at discharge? Yes   Assessment: 1. Acute on chronic systolic CHF (LVEF 40-45%), due to ICM. NYHA class III symptoms. - Continue furosemide 40 mg IV BID. Strict I/Os and daily weights. Keep K>4 - replaced with 6 runs IV KCl - and Mg>2. - Reported taking carvedilol 25 mg BID PTA.  Dispense history does not support this. Holding for now with noncompliance, still volume overloaded. - Consider starting losartan 25 mg daily and spironolactone 25 mg daily if agreeable to maintain medication compliance. - Consider restarting Jardiance 10 mg daily. It appears she was partially complaint with this (30 day supplies filled on 5/4, 7/5, and 9/14)  Plan: 1) Medication changes recommended at this time: - Continue IV diuresis - Restart Jardiance 10 mg daily  - Start losartan 25 mg daily and spironolactone 25 mg daily tomorrow - patient needs to confirm compliance to GDMT  2) Patient assistance: - None pending, has [place] Medicaid  3)  Education  - To be completed prior to discharge  Sharen Hones, PharmD, BCPS Heart Failure Engineer, building services Phone [phone]

## 2023-10-19 ENCOUNTER — Other Ambulatory Visit: Payer: Self-pay | Admitting: Nurse Practitioner

## 2023-10-19 ENCOUNTER — Other Ambulatory Visit (HOSPITAL_COMMUNITY): Payer: Self-pay

## 2023-10-19 ENCOUNTER — Telehealth: Payer: Self-pay | Admitting: *Deleted

## 2023-10-19 DIAGNOSIS — Z515 Encounter for palliative care: Secondary | ICD-10-CM

## 2023-10-19 DIAGNOSIS — I5043 Acute on chronic combined systolic (congestive) and diastolic (congestive) heart failure: Secondary | ICD-10-CM | POA: Diagnosis not present

## 2023-10-19 DIAGNOSIS — G893 Neoplasm related pain (acute) (chronic): Secondary | ICD-10-CM

## 2023-10-19 DIAGNOSIS — C50412 Malignant neoplasm of upper-outer quadrant of left female breast: Secondary | ICD-10-CM

## 2023-10-19 LAB — COMPREHENSIVE METABOLIC PANEL
ALT: 78 U/L — ABNORMAL HIGH (ref 0–44)
AST: 36 U/L (ref 15–41)
Albumin: 3 g/dL — ABNORMAL LOW (ref 3.5–5.0)
Alkaline Phosphatase: 92 U/L (ref 38–126)
Anion gap: 5 (ref 5–15)
BUN: 11 mg/dL (ref 6–20)
CO2: 23 mmol/L (ref 22–32)
Calcium: 8.7 mg/dL — ABNORMAL LOW (ref 8.9–10.3)
Chloride: 108 mmol/L (ref 98–111)
Creatinine, Ser: 0.78 mg/dL (ref 0.44–1.00)
GFR, Estimated: >60 mL/min (ref >60)
Glucose, Bld: 100 mg/dL — ABNORMAL HIGH (ref 70–99)
Potassium: 3.1 mmol/L — ABNORMAL LOW (ref 3.5–5.1)
Sodium: 136 mmol/L (ref 135–145)
Total Bilirubin: 0.5 mg/dL (ref 0.3–1.2)
Total Protein: 6.5 g/dL (ref 6.5–8.1)

## 2023-10-19 MED ORDER — FUROSEMIDE 40 MG PO TABS: 80.0000 mg | ORAL_TABLET | Freq: Every day | ORAL | Status: DC

## 2023-10-19 MED ORDER — LOSARTAN POTASSIUM 25 MG PO TABS
25.0000 mg | ORAL_TABLET | Freq: Every day | ORAL | Status: DC
  Administered 2023-10-19 – 2023-10-20 (×2): 25 mg via ORAL
  Filled 2023-10-19 (×2): qty 1

## 2023-10-19 MED ORDER — POTASSIUM CHLORIDE 20 MEQ PO PACK: 60.0000 meq | PACK | Freq: Once | ORAL | Status: DC

## 2023-10-19 MED ORDER — EMPAGLIFLOZIN 10 MG PO TABS
10.0000 mg | ORAL_TABLET | Freq: Every day | ORAL | Status: DC
  Administered 2023-10-19 – 2023-10-20 (×2): 10 mg via ORAL
  Filled 2023-10-19 (×2): qty 1

## 2023-10-19 MED ORDER — FUROSEMIDE 40 MG PO TABS
40.0000 mg | ORAL_TABLET | Freq: Every day | ORAL | Status: DC
Start: 2023-10-20 — End: 2023-10-20
  Administered 2023-10-20: 40 mg via ORAL
  Filled 2023-10-19: qty 1

## 2023-10-19 MED ORDER — POTASSIUM CHLORIDE CRYS ER 20 MEQ PO TBCR
40.0000 meq | EXTENDED_RELEASE_TABLET | Freq: Two times a day (BID) | ORAL | Status: AC
  Administered 2023-10-19: 40 meq via ORAL
  Filled 2023-10-19: qty 2

## 2023-10-19 MED ORDER — FUROSEMIDE 40 MG PO TABS: 80.0000 mg | ORAL_TABLET | Freq: Two times a day (BID) | ORAL | Status: DC

## 2023-10-19 MED ORDER — FUROSEMIDE 10 MG/ML IJ SOLN
40.0000 mg | Freq: Two times a day (BID) | INTRAMUSCULAR | Status: DC
  Administered 2023-10-19: 40 mg via INTRAVENOUS

## 2023-10-19 MED ORDER — POTASSIUM CHLORIDE CRYS ER 20 MEQ PO TBCR
40.0000 meq | EXTENDED_RELEASE_TABLET | Freq: Once | ORAL | Status: AC
  Administered 2023-10-19: 40 meq via ORAL
  Filled 2023-10-19: qty 2

## 2023-10-19 MED ORDER — POTASSIUM CHLORIDE CRYS ER 20 MEQ PO TBCR: 40.0000 meq | EXTENDED_RELEASE_TABLET | Freq: Two times a day (BID) | ORAL | Status: DC

## 2023-10-19 NOTE — Progress Notes (Signed)
Heart Failure Stewardship Pharmacist Progress Note   PCP: Darral Dash, DO PCP-Cardiologist: Thomasene Ripple, DO    HPI:  62 yo F with PMH of HFrEF, HTN, CAD (MI - BMS to RCA in 2008; MI - DES to LAD 2009), HLD, lymphedema, substance use (cocaine), venous stasis, and breast cancer.   Presented to the ED on 10/27 with shortness of breath and chest pressure. Reports she ran out of her lasix and Jardiance 2 days prior. Last use of cocaine was a few days before admission. CXR with stable cardiomegaly, pulmonary vascular congestion, and small L pleural effusion. CTA negative for PE. BNP 689.4. ECHO 10/28 showed LVEF 40-45%, RWMA, mild asymmetric LVH, RV normal, mild MR.   Cardiac testing: EF 60-65% 09/2019  Underwent stress test 12/2019 - intermediate risk EF 60-65% 03/2022  EF 55-60% 06/2022 EF 35-40% 08/2023 Underwent repeat stress test 08/2023 - no new areas of ischemia  Current HF Medications: Diuretic: furosemide 40 mg IV BID ACE/ARB/ARNI: losartan 25 mg daily SGLT2i: Jardiance 10 mg daily  Prior to admission HF Medications: Diuretic: furosemide 40 mg daily (or 60 mg daily PRN weight gain) Beta blocker: carvedilol 25 mg BID SGLT2i: Jardiance 10 mg daily  Pertinent Lab Values: Serum creatinine 0.78, BUN 11, Potassium 3.1, Sodium 136, BNP 689.4, Magnesium 2.0  Vital Signs: Weight: 203 lbs (admission weight: 203 lbs) Blood pressure: 120-140/80s  Heart rate: 70-80s  I/O: net -0.9L yesterday; net -1.6L since admission  Medication Assistance / Insurance Benefits Check: Does the patient have prescription insurance?  Yes Type of insurance plan: [place] Medicaid  Outpatient Pharmacy:  Prior to admission outpatient pharmacy: [facility] Is the patient willing to use [facility] TOC pharmacy at discharge? Yes   Assessment: 1. Acute on chronic systolic CHF (LVEF 40-45%), due to ICM. NYHA class III symptoms. - Continue furosemide 40 mg IV BID. Still somewhat fluid overloaded on exam. Reports shortness  of breath. Strict I/Os and daily weights. Keep K>4 - replaced with KCl 40 mEq x 2- and Mg>2. - Previously reported taking carvedilol 25 mg BID PTA. Dispense history does not support this. On interview today, patient denies taking this PTA - has been off of it for several months. Holding for now with noncompliance, still volume overloaded. Educated the patient that this may end up being restarted in the future pending compliance and abstinence from drug use.  - Agree with starting losartan 25 mg daily  - Consider starting spironolactone 25 mg daily if agreeable to maintain medication compliance. - Agree with restarting Jardiance 10 mg daily. It appears she was partially complaint with this (30 day supplies filled on 5/4, 7/5, and 9/14)  Plan: 1) Medication changes recommended at this time: - Agree with changes - Start spironolactone 25 mg daily tomorrow - patient needs to confirm compliance to GDMT  2) Patient assistance: - None pending, has [place] Medicaid - Will need all of her medications filled from [facility] pharmacy at discharge  3)  Education  - Patient has been educated on current HF medications and potential additions to HF medication regimen - Patient verbalizes understanding that over the next few months, these medication doses may change and more medications may be added to optimize HF regimen - Patient has been educated on basic disease state pathophysiology and goals of therapy - Patient is hesitant to start new medications but understands that these have been proven to keep her living longer, prevent hospitalizations, and help her feel better.    Sharen Hones, PharmD, BCPS  Heart Failure Engineer, building services Phone [phone]

## 2023-10-19 NOTE — Treatment Plan (Addendum)
Called to bedside by nursing.  Patient was inquiring whether we could represcribe her methadone as she was under the impression we had discontinued it and she was now no longer able to pick it up from her pharmacy.  Discussed with patient that we had not discontinued her methadone outside of the hospital, only held it while she was admitted inpatient as we were treating her pain with oxycodone.  Informed patient that if she were wanting [name] to take over prescription of this medication it would not be done on her inpatient visit, she would need to discuss this issue with her primary care doctor after being discharged.  After discussion patient requested that we call her oncologist and have him come and speak to her.  I relayed that at most I could send him a message relaying her concerns.  Patient expressed understanding and agreement with the plan.  Upon review of the medical record, it appears that oncology is already aware that patient would like for methadone to be refilled and they have informed her that they will not do so.  See telephone encounter note from 10/30 at 4:26 PM.  Gerrit Heck, DO PGY1, Family Medicine

## 2023-10-19 NOTE — Assessment & Plan Note (Addendum)
Patient is down a total of 1.6 L since admission.  Pending cardiology recommendations plan to transition to 80 mg of Lasix oral. -Continue with IV diuresis, plan to transition to 40 mg Lasix p.o. daily -Restart antihypertensive agent such as Imdur if needed -Cardiology recommends beginning losartan 25 mg and spironolactone 25 mg daily tomorrow, restart Jardiance 10 mg daily -Would not recommend beta-blocker at this time -Strict I's and O's -Patient tolerating room air at this time -AM CMP

## 2023-10-19 NOTE — Progress Notes (Signed)
Rounding Note    Patient Name: [name] Date of Encounter: 10/19/2023  [facility] Cardiologist: Thomasene Ripple, DO   Subjective   No acute events overnight. Breathing slowly improving. Plans to abstain from cocaine again on discharge.  Inpatient Medications    Scheduled Meds:  allopurinol  300 mg Oral Daily   aspirin EC  81 mg Oral Daily   cholecalciferol  1,000 Units Oral Daily   empagliflozin  10 mg Oral Daily   enoxaparin (LOVENOX) injection  40 mg Subcutaneous Q24H   ezetimibe  10 mg Oral Daily   fluticasone  1 spray Each Nare Daily   [START ON 10/20/2023] furosemide  40 mg Oral Daily   losartan  25 mg Oral Daily   medroxyPROGESTERone  10 mg Oral Daily   Continuous Infusions:   PRN Meds: ipratropium-albuterol, mouth rinse, oxyCODONE   Vital Signs    Vitals:   10/19/23 0330 10/19/23 0404 10/19/23 0829 10/19/23 1205  BP: (!) 149/85  124/81 (!) 136/96  Pulse: 89  80 72  Resp: 16  18 18   Temp: 98.7 F (37.1 C)  98 F (36.7 C) 98 F (36.7 C)  TempSrc: Oral  Oral Oral  SpO2: 96%  96% 96%  Weight:  92.1 kg    Height:        Intake/Output Summary (Last 24 hours) at 10/19/2023 1253 Last data filed at 10/19/2023 0100 Gross per 24 hour  Intake 600 ml  Output 1050 ml  Net -450 ml      10/19/2023    4:04 AM 10/18/2023    5:04 AM 10/17/2023    4:11 AM  Last 3 Weights  Weight (lbs) 203 lb 1.6 oz 204 lb 9.4 oz 203 lb 7.8 oz  Weight (kg) 92.126 kg 92.8 kg 92.3 kg      Telemetry    SR with occasional ectopy - Personally Reviewed  Physical Exam   GEN: Well nourished, well developed in no acute distress NECK: No JVD CARDIAC: regular rhythm, normal S1 and S2, no rubs or gallops. No murmur. VASCULAR: Radial pulses 2+ bilaterally.  RESPIRATORY:  Clear to auscultation without rales, wheezing or rhonchi  ABDOMEN: Soft, non-tender, non-distended MUSCULOSKELETAL:  Moves all 4 limbs independently SKIN: Warm and dry, no edema NEUROLOGIC:  No  focal neuro deficits noted. PSYCHIATRIC:  Normal affect    New pertinent results (labs, ECG, imaging, cardiac studies)    Echo reviewed this admission  Patient Profile     62 y.o. female  with a hx of CAD with MI s/p BMS to RCA 2008, DES to LAD 2009, chronic HFrEF (reduced EF 08/2023 with moderate-severe mitral regurgitation), HTN, HLD, pre-DM, suspected CKD stage 2, obesity, tobacco abuse, chronic pain, cocaine use, aortic atherosclerosis, breast CA s/p bilateral mastectomy, possible uterine CA, medical nonadherence who is being seen 10/17/2023 for the evaluation of CHF at the request of Dr. Jena Gauss.   Assessment & Plan    Acute on chronic systolic and diastolic heart failure Medication nonadherence Cocaine use -on IV lasix. I/O not well captured. Appears euvolemic today, will transition back to home dose of furosemide 40 mg oral tomorrow AM -appreciate pharmacy assistance. Does not appear that she was adherent to carvedilol based on fill history. Would be cautious with this given cocaine use (even with nonselective beta blocker) and nonadherence -will start losartan 25 mg daily given BP and persistently low K.  -would start spironolactone if losartan tolerated based on K/Cr/BP -restarting Jardiance 10 mg today  Hypokalemia -has required multiple repletions. Starting ARB as above, would start MRA as above if tolerated  CAD History of BMS RCA 2008, DES LAD 2009 -multiple stress tests showing prior infarct but no ischemia -reported on aspirin prior to admission -atorvastatin listed on outside meds, also on zetia as outpatient. She reports that she was taken off the atorvastatin for unclear reasons.  Mitral regurgitation -reported as mild on most recent echo    Signed, Jodelle Red, MD  10/19/2023, 12:53 PM

## 2023-10-19 NOTE — Progress Notes (Signed)
Called to bedside by patient.  She is requesting to speak with "the cardiologist" as soon as possible regarding medication concerns; no specific concern reported to this RN.  Noted she has heightened anxiety at this time and appears to have mildly escalating opioid withdrawal symptoms despite receiving oxycodone 5mg  PO approximately 1 hour ago.  Savannah Spies, PA for [facility] cardiology made aware by secure message.  [name] requested this RN make primary aware and that she will address any specific cardiology-related concern if identified by primary.  Paged FMTS at [phone] x 2 with no response and repeated secure message to FMTS attending MD, Dr. Burley [name].  Response received from Dr. Evette [name]; residency service will respond to patient.  Continuing to monitor and will address any ongoing patient issues.

## 2023-10-19 NOTE — Discharge Instructions (Addendum)
Dear [name],  Thank you for letting us participate in your care. You were hospitalized for shortness of breath and diagnosed with CHF (congestive heart failure) (HCC). You were treated with water pills and oxygen.   POST-HOSPITAL & CARE INSTRUCTIONS Be sure to take all of your medications as listed in this packet. Go to your follow up appointments (listed below)  DOCTOR'S APPOINTMENT   Future Appointments  Date Time Provider Department Center  10/24/2023  2:00 PM [facility] PALLIATIVE CARE [facility] None  10/25/2023 11:15 AM Savannah Moulds, MD [facility] None  10/26/2023  2:10 PM Savannah Rosenthal, MD [facility] None  10/31/2023  9:00 AM [facility] HEART IMPACT [facility] None  11/11/2023 11:00 AM [name], Savannah Mound, [name] [facility] None    Follow-up Information     [name], Savannah Hanlon, [name] Follow up.   Specialty: Family Medicine Contact information: [address] [place] [address] [phone]         [facility]. Go in 11 day(s).   Specialty: Cardiology Why: Hospital Follow-Up Please bring Medication list to appointment Free [address] Entrance C, Off [employer] information: [address] [place] [address] [phone]                Take care and be well!  Family Medicine Teaching Service Inpatient Team [place]  [facility]  [address] [place], [place] [address] [phone]

## 2023-10-19 NOTE — Progress Notes (Signed)
Daily Progress Note Intern Pager: [phone]  Patient name: [name] Medical record number: [id number] Date of birth: 01-25-1961 Age: 62 y.o. Gender: female  Primary Care Provider: Darral Dash, DO Consultants: Cardiology Code Status: Full  Pt Overview and Major Events to Date:  10/28: Admitted to FMTS  Assessment and Plan:  [name] is a 62 year old lady with a history of chronic diastolic heart failure, hypertension and cocaine use admitted for acute on chronic heart failure in the setting of medication noncompliance and cocaine use. Cardiology is following and will likely intiate GDMT for heart failure.  Patient could possibly discharge today or tomorrow pending further recommendations from cardiology. Assessment & Plan Acute on chronic diastolic heart failure (HCC) Patient is down a total of 1.6 L since admission.  Pending cardiology recommendations plan to transition to 80 mg of Lasix oral. -Continue with IV diuresis, plan to transition to 40 mg Lasix p.o. daily -Restart antihypertensive agent such as Imdur if needed -Cardiology recommends beginning losartan 25 mg and spironolactone 25 mg daily tomorrow, restart Jardiance 10 mg daily -Would not recommend beta-blocker at this time -Strict I's and O's -Patient tolerating room air at this time -AM CMP Chest pain CT angio did not show central or lobar PE, could not rule out segmental or subsegmental PE.  Patient is still reporting some chest pain but reports that it is improving.  Tropes did continue to rise yesterday but did peak at 130.  Cardiology is consulted and following see above further recommendations. --continue to monitor -Added K-pad for likely chest wall pain Abnormal LFTs Downtrending, likely related to heart failure Electrolyte abnormality Patient remains hypokalemic, will continue repletion as appropriate.  Patient this is an issue she deals with at home and she takes daily potassium  supplementation.   Chronic and Stable Problems:  Cocaine use: TOC, [place].  UDS positive for only cocaine H/O EIN with squamous metaplasia: Will follow up with Dr. Pricilla Holm outpatient, continue home progesterone CAD: ASA home dose HLD: Continue Zetia Lymphedema: Patient takes methadone outpatient, on oxycodone in the hospital Thrombocytosis: Monitor CBC Prediabetes: CBG on BMPs   FEN/GI: Heart healthy PPx: Lovenox Dispo:Home  pending cardiology recommendations .   Subjective:  Patient awake and alert resting comfortably in bed this morning.  She reports that she is feeling better but is still feeling somewhat anxious about her condition.  She reports that she did not receive a heating pad that was ordered but would ask for it today if she needed.  Objective: Temp:  [97.7 F (36.5 C)-98.7 F (37.1 C)] 98.7 F (37.1 C) (10/30 0330) Pulse Rate:  [76-95] 89 (10/30 0330) Resp:  [16-18] 16 (10/30 0330) BP: (120-149)/(79-105) 149/85 (10/30 0330) SpO2:  [94 %-97 %] 96 % (10/30 0330) Weight:  [92.1 kg] 92.1 kg (10/30 0404) Physical Exam: General: Alert, oriented.  No obvious distress Cardiovascular: RRR, no M/R/G Respiratory: CTAB, no increased work of breathing Abdomen: Flat, soft, nontender Extremities: Plus peripheral pulses in all 4 extremities, left arm with known lymphedema.  Bilateral lower extremity without edema today.  Laboratory: Most recent CBC Lab Results  Component Value Date   WBC 9.3 10/18/2023   HGB 14.1 10/18/2023   HCT 43.7 10/18/2023   MCV 79.9 (L) 10/18/2023   PLT 471 (H) 10/18/2023   Most recent BMP    Latest Ref Rng & Units 10/19/2023    3:45 AM  BMP  Glucose 70 - 99 mg/dL 213   BUN 6 - 20 mg/dL  11   Creatinine 0.44 - 1.00 mg/dL 9.81   Sodium 191 - 478 mmol/L 136   Potassium 3.5 - 5.1 mmol/L 3.1   Chloride 98 - 111 mmol/L 108   CO2 22 - 32 mmol/L 23   Calcium 8.9 - 10.3 mg/dL 8.7    Gerrit Heck, DO 10/19/2023, 7:21 AM  PGY-1, [facility]  Health Family Medicine FPTS Intern pager: [phone], text pages welcome Secure chat group [facility] Teaching Service

## 2023-10-19 NOTE — Assessment & Plan Note (Addendum)
Patient remains hypokalemic, will continue repletion as appropriate.  Patient this is an issue she deals with at home and she takes daily potassium supplementation.

## 2023-10-19 NOTE — Assessment & Plan Note (Addendum)
Downtrending, likely related to heart failure

## 2023-10-19 NOTE — Progress Notes (Signed)
Mobility Specialist Progress Note:    10/19/23 1633  Mobility  Activity Ambulated with assistance in hallway  Level of Assistance Standby assist, set-up cues, supervision of patient - no hands on  Assistive Device None  Distance Ambulated (ft) 300 ft  Activity Response Tolerated well  Mobility Referral Yes  $Mobility charge 1 Mobility  Mobility Specialist Start Time (ACUTE ONLY) 1610  Mobility Specialist Stop Time (ACUTE ONLY) 1625  Mobility Specialist Time Calculation (min) (ACUTE ONLY) 15 min   Received pt in bed having no complaints and agreeable to mobility. Pt was asymptomatic throughout ambulation and returned to room w/o fault. Left in bed w/ call bell in reach and all needs met.   Thompson Grayer Mobility Specialist  Please contact vis Secure Chat or  Rehab Office [phone]

## 2023-10-19 NOTE — Assessment & Plan Note (Signed)
CT angio did not show central or lobar PE, could not rule out segmental or subsegmental PE.  Patient is still reporting some chest pain but reports that it is improving.  Tropes did continue to rise yesterday but did peak at 130.  Cardiology is consulted and following see above further recommendations. --continue to monitor -Added K-pad for likely chest wall pain

## 2023-10-19 NOTE — Telephone Encounter (Signed)
This RN returned call to pt per her VM stating she will be at her appt on 10/25/2023 with MD but also she is request for Dr Al Pimple to refill her methadone due to palliative provider is declining to do.  This RN per informed pt Dr Al Pimple will not refill the methadone as her hospital records have been reviewed - and concerns discussed with palliative provider - and due to the severity of her cardiac issues the methadone is contra indicated and could cause greater heart failure.  This RN reiterated concern for her current cardiac status being life threatening and need to discuss her pain issues with her cardiologist to what medications would be appropriate.  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  Pt stated concern " but you cannot just stop the methadone "-  This RN inquired when was her last dose - which pt states was " at least 5 days ago" including not taking it for several days prior to admission.  This RN discussed likely symptoms secondary to not taking the methadone would have occurred already.  This RN validated the pt's current status and concern for her outcome including use of illicit drugs that very well could have taken her life.  Pt verbalized understanding and appreciation of discussion as well as while on the phone with this RN - she spoke with her inpt nurse asking for discussion with cardiologist.

## 2023-10-19 NOTE — Progress Notes (Signed)
Occupational Therapy Treatment Patient Details Name: [name] MRN: [id number] DOB: September 26, 1961 Today's Date: 10/19/2023   History of present illness Pt is a 62 y/o female presenting with dyspnea, chest pain and HTN in setting of acute on chronic CHF exacerbation. Recent cocaine and etoh use. PMHx:  CAD s/p stent in 2008 (BMS to RCA, DES to LAD in '09), tobacco and cocaine use, HTN, and obesity.   OT comments  Patient making progress towards goals. atient reporting dizziness with movement, but VSS. Patient supervision with ADLs, however patient reporting numbness on R side of face and R arm and fingers. Strength and dexterity assessed WNL, no facial droop or disorientation. No numbness in RLE. OT immediately informing RN at end of session. OT will continue to follow.       If plan is discharge home, recommend the following:  A little help with bathing/dressing/bathroom;Assistance with cooking/housework;Direct supervision/assist for financial management;Direct supervision/assist for medications management;Assist for transportation   Equipment Recommendations  None recommended by OT    Recommendations for Other Services      Precautions / Restrictions Precautions Precautions: Fall;Other (comment) Precaution Comments: monitor O2 Restrictions Weight Bearing Restrictions: No       Mobility Bed Mobility Overal bed mobility: Needs Assistance Bed Mobility: Supine to Sit, Sit to Supine     Supine to sit: Supervision Sit to supine: Supervision        Transfers Overall transfer level: Needs assistance Equipment used: None Transfers: Bed to chair/wheelchair/BSC, Sit to/from Stand Sit to Stand: Supervision     Step pivot transfers: Contact guard assist, Min assist     General transfer comment: No assit to stand and transfer to the bathroom     Balance Overall balance assessment: Needs assistance, History of Falls Sitting-balance support: No upper extremity supported,  Feet supported Sitting balance-Leahy Scale: Good     Standing balance support: No upper extremity supported, During functional activity, Single extremity supported Standing balance-Leahy Scale: Fair Standing balance comment: occasionally reaching out for furniture                           ADL either performed or assessed with clinical judgement   ADL Overall ADL's : Needs assistance/impaired     Grooming: Wash/dry face;Wash/dry hands;Oral care;Standing;Supervision/safety       Lower Body Bathing: Supervison/ safety;Sit to/from stand;Sitting/lateral leans           Toilet Transfer: Supervision/safety;Ambulation   Toileting- Clothing Manipulation and Hygiene: Supervision/safety;Sit to/from stand;Sitting/lateral lean       Functional mobility during ADLs: Supervision/safety;Cueing for safety General ADL Comments: Patient reporting dizziness with movement, but VSS. Patient supervision with ADLs, however patient reporting numbness on R side of face and R arm and fingers. Strength and dexterity assessed WNL, no facial droop or disorientation. No numbness in RLE. OT immediately informing RN at end of session. OT will continue to follow.    Extremity/Trunk Assessment              Vision       Perception     Praxis      Cognition Arousal: Alert Behavior During Therapy: Restless, Impulsive (Looks anxious, but pt states not really anxious) Overall Cognitive Status: No family/caregiver present to determine baseline cognitive functioning  Exercises      Shoulder Instructions       General Comments VSS, new numbness in R side of face and arm    Pertinent Vitals/ Pain       Pain Assessment Pain Assessment: No/denies pain  Home Living                                          Prior Functioning/Environment              Frequency  Min 1X/week        Progress Toward  Goals  OT Goals(current goals can now be found in the care plan section)  Progress towards OT goals: Progressing toward goals  Acute Rehab OT Goals Patient Stated Goal: to get this numbness figured out OT Goal Formulation: With patient Time For Goal Achievement: 10/31/23 Potential to Achieve Goals: Good  Plan      Co-evaluation                 AM-PAC OT "6 Clicks" Daily Activity     Outcome Measure   Help from another person eating meals?: None Help from another person taking care of personal grooming?: A Little Help from another person toileting, which includes using toliet, bedpan, or urinal?: A Little Help from another person bathing (including washing, rinsing, drying)?: A Little Help from another person to put on and taking off regular upper body clothing?: A Little Help from another person to put on and taking off regular lower body clothing?: A Little 6 Click Score: 19    End of Session    OT Visit Diagnosis: Unsteadiness on feet (R26.81);Other abnormalities of gait and mobility (R26.89);Muscle weakness (generalized) (M62.81)   Activity Tolerance Patient tolerated treatment well   Patient Left in bed;with call bell/phone within reach   Nurse Communication Mobility status        Time: 8469-6295 OT Time Calculation (min): 20 min  Charges: OT General Charges $OT Visit: 1 Visit OT Treatments $Self Care/Home Management : 8-22 mins  Pollyann Glen E. Shaquel Josephson, OTR/L Acute Rehabilitation Services [phone]   Cherlyn Cushing 10/19/2023, 9:51 AM

## 2023-10-20 ENCOUNTER — Other Ambulatory Visit (HOSPITAL_COMMUNITY): Payer: Self-pay

## 2023-10-20 ENCOUNTER — Other Ambulatory Visit: Payer: Self-pay

## 2023-10-20 ENCOUNTER — Telehealth: Payer: Self-pay | Admitting: Nurse Practitioner

## 2023-10-20 ENCOUNTER — Encounter: Payer: Self-pay | Admitting: Nurse Practitioner

## 2023-10-20 ENCOUNTER — Other Ambulatory Visit: Payer: Self-pay | Admitting: Nurse Practitioner

## 2023-10-20 DIAGNOSIS — C50412 Malignant neoplasm of upper-outer quadrant of left female breast: Secondary | ICD-10-CM

## 2023-10-20 DIAGNOSIS — I5033 Acute on chronic diastolic (congestive) heart failure: Secondary | ICD-10-CM | POA: Diagnosis not present

## 2023-10-20 DIAGNOSIS — C50811 Malignant neoplasm of overlapping sites of right female breast: Secondary | ICD-10-CM

## 2023-10-20 DIAGNOSIS — G893 Neoplasm related pain (acute) (chronic): Secondary | ICD-10-CM

## 2023-10-20 DIAGNOSIS — I5043 Acute on chronic combined systolic (congestive) and diastolic (congestive) heart failure: Secondary | ICD-10-CM | POA: Diagnosis not present

## 2023-10-20 DIAGNOSIS — Z515 Encounter for palliative care: Secondary | ICD-10-CM

## 2023-10-20 DIAGNOSIS — F1120 Opioid dependence, uncomplicated: Secondary | ICD-10-CM

## 2023-10-20 LAB — COMPREHENSIVE METABOLIC PANEL
ALT: 77 U/L — ABNORMAL HIGH (ref 0–44)
AST: 36 U/L (ref 15–41)
Albumin: 3 g/dL — ABNORMAL LOW (ref 3.5–5.0)
Alkaline Phosphatase: 84 U/L (ref 38–126)
Anion gap: 7 (ref 5–15)
BUN: 14 mg/dL (ref 6–20)
CO2: 22 mmol/L (ref 22–32)
Calcium: 8.7 mg/dL — ABNORMAL LOW (ref 8.9–10.3)
Chloride: 109 mmol/L (ref 98–111)
Creatinine, Ser: 0.93 mg/dL (ref 0.44–1.00)
GFR, Estimated: >60 mL/min (ref >60)
Glucose, Bld: 88 mg/dL (ref 70–99)
Potassium: 3.4 mmol/L — ABNORMAL LOW (ref 3.5–5.1)
Sodium: 138 mmol/L (ref 135–145)
Total Bilirubin: 0.4 mg/dL (ref 0.3–1.2)
Total Protein: 6 g/dL — ABNORMAL LOW (ref 6.5–8.1)

## 2023-10-20 MED ORDER — METHADONE HCL 5 MG PO TABS
10.0000 mg | ORAL_TABLET | Freq: Three times a day (TID) | ORAL | 0 refills | Status: DC
  Filled 2023-10-20: qty 90, 15d supply, fill #0

## 2023-10-20 MED ORDER — METHADONE HCL 10 MG PO TABS
10.0000 mg | ORAL_TABLET | Freq: Three times a day (TID) | ORAL | 0 refills | Status: DC
  Filled 2023-10-20: qty 63, 21d supply, fill #0

## 2023-10-20 MED ORDER — SPIRONOLACTONE 25 MG PO TABS
25.0000 mg | ORAL_TABLET | Freq: Every day | ORAL | 0 refills | Status: DC
  Filled 2023-10-20: qty 30, 30d supply, fill #0

## 2023-10-20 MED ORDER — SPIRONOLACTONE 25 MG PO TABS
25.0000 mg | ORAL_TABLET | Freq: Every day | ORAL | Status: DC
  Administered 2023-10-20: 25 mg via ORAL
  Filled 2023-10-20: qty 1

## 2023-10-20 MED ORDER — LOSARTAN POTASSIUM 25 MG PO TABS
25.0000 mg | ORAL_TABLET | Freq: Every day | ORAL | 0 refills | Status: DC
  Filled 2023-10-20: qty 30, 30d supply, fill #0

## 2023-10-20 MED ORDER — POTASSIUM CHLORIDE CRYS ER 20 MEQ PO TBCR
40.0000 meq | EXTENDED_RELEASE_TABLET | Freq: Once | ORAL | Status: AC
  Administered 2023-10-20: 40 meq via ORAL
  Filled 2023-10-20: qty 2

## 2023-10-20 NOTE — TOC Initial Note (Addendum)
Transition of Care [facility]) - Initial/Assessment Note    Patient Details  Name: [name] MRN: [id number] Date of Birth: 1961/03/03  Transition of Care [facility]) CM/SW Contact:    Leone Haven, RN Phone Number: 10/20/2023, 1:27 PM  Clinical Narrative:                 From home with boyfriend, has PCP and insurance on file, states has no HH services in place at this time, has a cane at home.  States she will need transport ast at discharge , states gets medications from [facility].  Pta self ambulatory.  Per pt , she needs no  PT f/u. Patient will call [facility] and schedule her apt for her lymphedema sleeves.   She has a ride waiver on file.  SA added to AVS.  Expected Discharge Plan: Home/Self Care Barriers to Discharge: No Barriers Identified   Patient Goals and CMS Choice Patient states their goals for this hospitalization and ongoing recovery are:: return to brothers home          Expected Discharge Plan and Services In-house Referral: NA Discharge Planning Services: CM Consult Post Acute Care Choice: NA Living arrangements for the past 2 months: Single Family Home Expected Discharge Date: 10/20/23               DME Arranged: N/A DME Agency: NA                  Prior Living Arrangements/Services Living arrangements for the past 2 months: Single Family Home Lives with:: Significant Other Patient language and need for interpreter reviewed:: Yes Do you feel safe going back to the place where you live?: Yes      Need for Family Participation in Patient Care: Yes (Comment) Care giver support system in place?: Yes (comment) Current home services: DME (cane) Criminal Activity/Legal Involvement Pertinent to Current Situation/Hospitalization: No - Comment as needed  Activities of Daily Living   ADL Screening (condition at time of admission) Independently performs ADLs?: No Does the patient have a NEW difficulty with  bathing/dressing/toileting/self-feeding that is expected to last >3 days?: No Does the patient have a NEW difficulty with getting in/out of bed, walking, or climbing stairs that is expected to last >3 days?: Yes (Initiates electronic notice to provider for possible PT consult) Does the patient have a NEW difficulty with communication that is expected to last >3 days?: No Is the patient deaf or have difficulty hearing?: No Does the patient have difficulty seeing, even when wearing glasses/contacts?: No Does the patient have difficulty concentrating, remembering, or making decisions?: No  Permission Sought/Granted Permission sought to share information with : Case Manager Permission granted to share information with : Yes, Verbal Permission Granted              Emotional Assessment Appearance:: Appears stated age Attitude/Demeanor/Rapport: Engaged Affect (typically observed): Appropriate Orientation: : Oriented to Self, Oriented to Place, Oriented to  Time, Oriented to Situation Alcohol / Substance Use: Illicit Drugs Psych Involvement: No (comment)  Admission diagnosis:  CHF (congestive heart failure) (HCC) [I50.9] Acute on chronic combined systolic and diastolic congestive heart failure (HCC) [I50.43] Patient Active Problem List   Diagnosis Date Noted   Acute on chronic diastolic heart failure (HCC) 10/17/2023   Abnormal LFTs 10/17/2023   Electrolyte abnormality 10/17/2023   Venous stasis dermatitis of both lower extremities 08/08/2023   Leg pain, bilateral 12/07/2022   Headache 07/08/2022   Polycythemia 07/05/2022   Elevated  liver enzymes 07/05/2022   CHF (congestive heart failure) (HCC) 07/04/2022   Mood altered 06/15/2022   Cocaine abuse (HCC) 03/31/2022   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   Tension headache 03/31/2022   Chest pain 03/30/2022   Hypomagnesemia 03/30/2022    Class: Acute   Constipation 03/30/2022   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    BMI 40.0-44.9, adult (HCC) 10/26/2021   Prediabetes 01/01/2021   Acquired absence of both breasts 04/01/2020   Bilateral leg edema 06/21/2019   Dysfunctional uterine bleeding 04/30/2019   Right leg pain 09/13/2018   Hyperlipidemia LDL goal <70 02/08/2018   History of ST elevation myocardial infarction (STEMI) 12/01/2017   Endometrial hyperplasia, simple 11/08/2017   Unilateral primary osteoarthritis, right knee 10/27/2017   CAD (coronary artery disease) 04/17/2016   Shortness of breath    Thrombocytosis 03/10/2015   Tobacco abuse 12/02/2014   Breast cancer of upper-outer quadrant of left female breast (HCC) 09/16/2014   Hot flashes due to tamoxifen 05/09/2014   Lymphedema of arm 11/08/2013   Hypokalemia 03/31/2012   Atherosclerosis of aorta (HCC)    PCP:  Darral Dash, DO Pharmacy:   [facility] [address] [place] [address] Phone: [phone] Fax: [phone]     Social Determinants of Health (SDOH) Social History: SDOH Screenings   Food Insecurity: No Food Insecurity (10/17/2023)  Housing: Low Risk  (10/18/2023)  Transportation Needs: No Transportation Needs (10/18/2023)  Recent Concern: Transportation Needs - Unmet Transportation Needs (10/17/2023)  Utilities: Not At Risk (10/17/2023)  Alcohol Screen: Low Risk  (09/21/2021)  Depression (PHQ2-9): Low Risk  (08/08/2023)  Financial Resource Strain: Low Risk  (10/18/2023)  Physical Activity: Sufficiently Active (07/28/2021)  Social Connections: Moderately Integrated (07/28/2021)  Stress: No Stress Concern Present (07/28/2021)  Tobacco Use: High Risk (10/20/2023)   SDOH Interventions: Housing Interventions: Intervention Not Indicated Transportation Interventions: Intervention Not Indicated Alcohol Usage Interventions: Intervention Not Indicated (Score <7) Financial Strain Interventions: Intervention Not Indicated   Readmission Risk Interventions    07/06/2022    3:02 PM   Readmission Risk Prevention Plan  Transportation Screening Complete  PCP or Specialist Appt within 3-5 Days Complete  HRI or Home Care Consult Complete  Social Work Consult for Recovery Care Planning/Counseling Complete  Palliative Care Screening Not Applicable  Medication Review Oceanographer) Complete

## 2023-10-20 NOTE — Plan of Care (Signed)
Care plan reviewed.

## 2023-10-20 NOTE — Discharge Summary (Signed)
Family Medicine Teaching [facility] Discharge Summary  Patient name: [name] Medical record number: [id number] Date of birth: 06-16-1961 Age: 62 y.o. Gender: female Date of Admission: 10/16/2023  Date of Discharge: 10/20/2023 Admitting Physician: Alfredo Martinez, MD  Primary Care Provider: Darral Dash, DO Consultants: Cardiology  Indication for Hospitalization: Acute on chronic diastolic heart failure  Discharge Diagnoses/Problem List:  Principal Problem for Admission: Acute on chronic diastolic heart failure Other Problems addressed during stay:  Principal Problem:   Acute on chronic diastolic heart failure [facility]) Active Problems:   Chest pain   CHF (congestive heart failure) (HCC)   Abnormal LFTs   Electrolyte abnormality    Brief Hospital Course:  [name] is a 62 y.o.female with a history of h/o breast cancer, CAD, CKD 2, gout, HTN, H/o MI, H/o pulm HTN who was admitted to the [facility] at [facility] for dyspnea. Her hospital course is detailed below:  Dyspnea  History of CHF  Presented to the ED after oxygen saturation was in the 70s and required Bipap administration with subsequent admission. CXR with evidence of CHF, increased interstitial markings and blunting of costophrenic angles. Able to quickly transition to RA after diuresis. Updated echo showed LVEF 40-45% with basal inferior LV wall hypokinesis. CTA negative for PE. IV diuresis continued through admission with net negative 1624.7 out and dry weight on discharge 92.1 kg.  Cardiology consulted and recommended starting Imdur and avoiding beta blockers due to concern for rebound hypertension. They also initiated GDMT with Losartan, spironolactone and restarting her Jardiance. On discharge she   HTN  Severely elevated pressures on admission, responded to IV lasix and nitroglycerin paste initially. Did not require antihypertensives while admitted.  Other chronic conditions were medically  managed with home medications and formulary alternatives as necessary   PCP Follow-up Recommendations: F/u with GYN-onc for recommended surgery Restart Imdur on discharge. Recheck potassium with initiating of K sparing therapy   Disposition: Home  Discharge Condition: Stable  Discharge Exam:  Vitals:   10/20/23 0330 10/20/23 0756  BP: 122/81 (!) 135/90  Pulse: 85 82  Resp: 20 18  Temp: 99.2 F (37.3 C) 98.5 F (36.9 C)  SpO2: 93% 92%   General: Alert, oriented.  No apparent distress Cardiac: RRR, no M/R/G Respiratory: CTAB, no increased work of breathing Abdomen, flat, soft, nontender. Extremities: 2+ peripheral pulses in the bilateral lower extremity, trace edema present.  TED hose in place   Significant Labs and Imaging:  No results for input(s): "WBC", "HGB", "HCT", "PLT" in the last 48 hours. Recent Labs  Lab 10/18/23 2014 10/19/23 0345 10/20/23 0327  NA 141 136 138  K 3.3* 3.1* 3.4*  CL 108 108 109  CO2 22 23 22   GLUCOSE 108* 100* 88  BUN 14 11 14   CREATININE 0.97 0.78 0.93  CALCIUM 9.0 8.7* 8.7*  ALKPHOS  --  92 84  AST  --  36 36  ALT  --  78* 77*  ALBUMIN  --  3.0* 3.0*    Results/Tests Pending at Time of Discharge: None  Discharge Medications:  Allergies as of 10/20/2023   No Known Allergies      Medication List     STOP taking these medications    carvedilol 25 MG tablet Commonly known as: COREG   methadone 5 MG tablet Commonly known as: DOLOPHINE   potassium chloride SA 20 MEQ tablet Commonly known as: Klor-Con M20       TAKE these medications  allopurinol 300 MG tablet Commonly known as: ZYLOPRIM Take 1 tablet (300 mg total) by mouth daily.   ascorbic acid 500 MG tablet Commonly known as: VITAMIN C Take 500 mg by mouth daily.   aspirin EC 81 MG tablet Take 81 mg by mouth daily.   cholecalciferol 25 MCG (1000 UNIT) tablet Commonly known as: VITAMIN D3 Take 1,000 Units by mouth daily.   ezetimibe 10 MG  tablet Commonly known as: ZETIA Take 1 tablet (10 mg total) by mouth daily.   fluticasone 50 MCG/ACT nasal spray Commonly known as: FLONASE Place 1 spray into both nostrils daily. What changed:  when to take this reasons to take this   furosemide 20 MG tablet Commonly known as: LASIX Take 2 tablets (40 mg total) by mouth daily. TAKE AN EXTRA TABLET IF WEIGHT GAIN 3 LBS IN 24 HOURS OR 5 LBS IN A WEEK   HAIR SKIN & NAILS ADVANCED PO Take 1 capsule by mouth daily.   isosorbide mononitrate 60 MG 24 hr tablet Commonly known as: IMDUR Take 1 tablet (60 mg total) by mouth at bedtime.   Jardiance 10 MG Tabs tablet Generic drug: empagliflozin Take 1 tablet (10 mg total) by mouth daily.   losartan 25 MG tablet Commonly known as: COZAAR Take 1 tablet (25 mg total) by mouth daily.   medroxyPROGESTERone 10 MG tablet Commonly known as: Provera Take 1 tablet (10 mg total) by mouth daily.   nitroGLYCERIN 0.4 MG SL tablet Commonly known as: NITROSTAT Place 1 tablet (0.4 mg total) under the tongue every 5 (five) minutes as needed for chest pain.   spironolactone 25 MG tablet Commonly known as: ALDACTONE Take 1 tablet (25 mg total) by mouth daily. Start taking on: October 21, 2023        Discharge Instructions: Please refer to Patient Instructions section of EMR for full details.  Patient was counseled important signs and symptoms that should prompt return to medical care, changes in medications, dietary instructions, activity restrictions, and follow up appointments.   Follow-Up Appointments:  Follow-up Information     [facility]. Go in 11 day(s).   Specialty: Cardiology Why: Hospital Follow-Up Please bring Medication list to appointment Free [address], Entrance [address], Off [employer] information: [address] [place] [address] [phone]        [facility]. Go on  10/24/2023.   Why: at 2:10 PM with Dr. Marisue Humble since Dr. Melissa Noon is not available. Contact information: [address] [place] [address] [phone]                Gerrit Heck, DO 10/20/2023, 10:55 AM PGY-1, [facility]

## 2023-10-20 NOTE — Progress Notes (Signed)
Rounding Note    Patient Name: [name] Date of Encounter: 10/20/2023  [facility] Cardiologist: Thomasene Ripple, DO   Subjective   No acute events overnight. Planned for discharge today. Reviewed medications as below and plans for outpatient follow up. Her concern today is that her methadone outpatient prescription needs refilled. She send a mychart request for this to her oncology team but is concerned that she will not be able to get it. She told me it was stopped because of her heart and asked me to write it for her. I discussed with her that I do not prescribe this medication.   Inpatient Medications    Scheduled Meds:  allopurinol  300 mg Oral Daily   aspirin EC  81 mg Oral Daily   cholecalciferol  1,000 Units Oral Daily   empagliflozin  10 mg Oral Daily   enoxaparin (LOVENOX) injection  40 mg Subcutaneous Q24H   ezetimibe  10 mg Oral Daily   fluticasone  1 spray Each Nare Daily   furosemide  40 mg Oral Daily   losartan  25 mg Oral Daily   medroxyPROGESTERone  10 mg Oral Daily   spironolactone  25 mg Oral Daily   Continuous Infusions:   PRN Meds: ipratropium-albuterol, mouth rinse, oxyCODONE   Vital Signs    Vitals:   10/19/23 1956 10/19/23 2302 10/20/23 0330 10/20/23 0756  BP: (!) 124/98 (!) 153/91 122/81 (!) 135/90  Pulse: 81 76 85 82  Resp: 18 20 20 18   Temp: 98 F (36.7 C) 98.4 F (36.9 C) 99.2 F (37.3 C) 98.5 F (36.9 C)  TempSrc: Oral Oral Oral Oral  SpO2:  97% 93% 92%  Weight:   92.5 kg   Height:        Intake/Output Summary (Last 24 hours) at 10/20/2023 1045 Last data filed at 10/20/2023 0332 Gross per 24 hour  Intake 480 ml  Output --  Net 480 ml      10/20/2023    3:30 AM 10/19/2023    4:04 AM 10/18/2023    5:04 AM  Last 3 Weights  Weight (lbs) 203 lb 14.8 oz 203 lb 1.6 oz 204 lb 9.4 oz  Weight (kg) 92.5 kg 92.126 kg 92.8 kg      Telemetry    SR with occasional ectopy - Personally Reviewed  Physical Exam    GEN: Well nourished, well developed in no acute distress NECK: No JVD CARDIAC: regular rhythm, normal S1 and S2, no rubs or gallops. No murmur. VASCULAR: Radial pulses 2+ bilaterally.  RESPIRATORY:  Clear to auscultation without rales, wheezing or rhonchi  ABDOMEN: Soft, non-tender, non-distended MUSCULOSKELETAL:  Moves all 4 limbs independently SKIN: Warm and dry, no edema NEUROLOGIC:  No focal neuro deficits noted. PSYCHIATRIC:  Normal affect    New pertinent results (labs, ECG, imaging, cardiac studies)    Echo reviewed this admission  Patient Profile     62 y.o. female  with a hx of CAD with MI s/p BMS to RCA 2008, DES to LAD 2009, chronic HFrEF (reduced EF 08/2023 with moderate-severe mitral regurgitation), HTN, HLD, pre-DM, suspected CKD stage 2, obesity, tobacco abuse, chronic pain, cocaine use, aortic atherosclerosis, breast CA s/p bilateral mastectomy, possible uterine CA, medical nonadherence who is being seen 10/17/2023 for the evaluation of CHF at the request of Dr. Jena Gauss.   Assessment & Plan    Acute on chronic systolic and diastolic heart failure Medication nonadherence Cocaine use -back on home oral lasix 40  mg daily -appreciate pharmacy assistance. Does not appear that she was adherent to carvedilol based on fill history. Would be cautious with this given cocaine use (even with nonselective beta blocker) and nonadherence -started losartan 25 mg daily  -started spironolactone 25 mg today -restarted Jardiance 10 mg  Hypokalemia -has required multiple repletions. Starting ARB and MRA as above  CAD History of BMS RCA 2008, DES LAD 2009 -multiple stress tests showing prior infarct but no ischemia -reported on aspirin prior to admission -atorvastatin listed on outside meds, also on zetia as outpatient. She reports that she was taken off the atorvastatin for unclear reasons.  Mitral regurgitation -reported as mild on most recent echo  Chronic methadone use:  defer to her palliative/oncology team. No cardiac contraindications if this needs to be continued. Her QT has been stable as compared to prior. There is a phone note from Dr. Remonia Richter team noting that cardiac concerns were the reason for stopping the methadone. Her heart failure exacerbation was due to cocaine use and medication nonadherence. Defer long term pain management strategy to her current providers.  [facility] will sign off.   Medication Recommendations:    Started losartan 25 mg daily, spironolactone 25 mg daily this admission Continue Jardiance 10 mg daily, furosemide 40 mg daily. May be able to change lasix to PRN as an outpatient No beta blocker as this is diastolic heart failure, history of cocaine use, history of nonadherence to carvedilol previously  Continue aspirin, ezetimibe  Change potassium to 20 meq daily from 60 meq given start of losartan and spironolactone this admission  Other recommendations (labs, testing, etc):  check BMET 1 week Follow up as an outpatient:  has appt with Dr. Servando Salina on 11/11/23     Signed, Jodelle Red, MD  10/20/2023, 10:45 AM

## 2023-10-20 NOTE — Progress Notes (Signed)
Physical Therapy Treatment and D/C Patient Details Name: [name] MRN: [id number] DOB: [birth year] Today's Date: 10/20/2023   History of Present Illness Pt is a 62 y/o female presenting with dyspnea, chest pain and HTN in setting of acute on chronic CHF exacerbation. Recent cocaine and etoh use. PMHx:  CAD s/p stent in 2008 (BMS to RCA, DES to LAD in '09), tobacco and cocaine use, HTN, and obesity.    PT Comments  Pt admitted with above diagnosis. Pt at baseline per pt with ambulation.  Accepts challenges to balance without LOB. No further  acute skilled PT needs and pt can f/u with Outpt PT for lymphedema treatment (pt was going to this PTA). Mobility specialists will continue to offer mobility while pt in hospital.  Sign off.     If plan is discharge home, recommend the following: A little help with bathing/dressing/bathroom;Assistance with cooking/housework;Assist for transportation;Help with stairs or ramp for entrance   Can travel by private vehicle        Equipment Recommendations  None recommended by PT    Recommendations for Other Services       Precautions / Restrictions Precautions Precautions: Fall;Other (comment) Precaution Comments: monitor O2 Restrictions Weight Bearing Restrictions: No     Mobility  Bed Mobility Overal bed mobility: Needs Assistance Bed Mobility: Supine to Sit, Sit to Supine     Supine to sit: Supervision Sit to supine: Supervision        Transfers Overall transfer level: Needs assistance Equipment used: None Transfers: Bed to chair/wheelchair/BSC, Sit to/from Stand Sit to Stand: Supervision   Step pivot transfers: Supervision       General transfer comment: No assist to stand and transfer to the bathroom    Ambulation/Gait Ambulation/Gait assistance: Supervision Gait Distance (Feet): 150 Feet Assistive device: None Gait Pattern/deviations: Step-through pattern   Gait velocity interpretation: 1.31 - 2.62 ft/sec,  indicative of limited community ambulator   General Gait Details: Pt slightly impulsive at times but was safe with no LOB noted.  Can accept min challenges to balance with out issues   Stairs             Wheelchair Mobility     Tilt Bed    Modified Rankin (Stroke Patients Only)       Balance Overall balance assessment: Needs assistance, History of Falls Sitting-balance support: No upper extremity supported, Feet supported Sitting balance-Leahy Scale: Good     Standing balance support: No upper extremity supported, During functional activity, Single extremity supported Standing balance-Leahy Scale: Fair Standing balance comment: occasionally reaching out for furniture                            Cognition Arousal: Alert Behavior During Therapy: Restless, Impulsive (Looks anxious, but pt states not really anxious) Overall Cognitive Status: No family/caregiver present to determine baseline cognitive functioning                                          Exercises      General Comments General comments (skin integrity, edema, etc.): VSS, O2 90% and > on RA      Pertinent Vitals/Pain Pain Assessment Pain Assessment: No/denies pain    Home Living  Prior Function            PT Goals (current goals can now be found in the care plan section) Acute Rehab PT Goals PT Goal Formulation: All assessment and education complete, DC therapy Progress towards PT goals: Progressing toward goals    Frequency    Min 1X/week      PT Plan      Co-evaluation              AM-PAC PT "6 Clicks" Mobility   Outcome Measure  Help needed turning from your back to your side while in a flat bed without using bedrails?: None Help needed moving from lying on your back to sitting on the side of a flat bed without using bedrails?: None Help needed moving to and from a bed to a chair (including a wheelchair)?:  None Help needed standing up from a chair using your arms (e.g., wheelchair or bedside chair)?: A Little Help needed to walk in hospital room?: A Little Help needed climbing 3-5 steps with a railing? : A Little 6 Click Score: 21    End of Session Equipment Utilized During Treatment: Gait belt Activity Tolerance: Patient tolerated treatment well;Patient limited by fatigue Patient left: in bed;with call bell/phone within reach Nurse Communication: Mobility status PT Visit Diagnosis: Muscle weakness (generalized) (M62.81)     Time: 4098-1191 PT Time Calculation (min) (ACUTE ONLY): 10 min  Charges:    $Gait Training: 8-22 mins PT General Charges $$ ACUTE PT VISIT: 1 Visit                     Evert Wenrich M,PT Acute Rehab Services [phone]    [name] 10/20/2023, 11:38 AM

## 2023-10-20 NOTE — Telephone Encounter (Signed)
Received a voicemail from patient requesting refill for her methadone stating she has spoken with Cardiologist today and was informed that her heart was "ok" and she was able to continue taking methadone.   Given patient's recent use of cocaine and events during hospitalization related to heart health I have made patient and Oncologist aware that palliative will no longer continue to manage patient's methadone. On yesterday patient was offered to be referred to outpatient pain management clinic which she declined at the time however today she has left a voicemail stating if that is her only option she would now like to proceed with referral.   As requested patient is being referred to [facility] clinic. They have advised it may take up to 14 days to get patient scheduled for an appointment.   Ms. Milliken has been hospitalized since 10/272024. On chart review no signs of withdrawals. Although she has not received methadone since admission. To prevent any complications at discharge I will send over a 21 day supply for methadone once she is discharged to the pharmacy to allow for coverage until she is scheduled to be seen at [facility] Pain clinic. Patient notified of this plan.

## 2023-10-20 NOTE — Telephone Encounter (Signed)
Late Entry from 10/19/2023   I received a call from [name] who shared she was currently admitted in the hospital at [facility] scheduled to be discharged on tomorrow (10/31) requested her methadone to be refilled. After review of her inpatient medical record provider returned called to patient.   I was able to connect with [name] via her personal cell phone identifying correct patient. She shared she was feeling much better and looking forward to discharging home soon. I discussed with patient concerns with her heart health and further refilling her methadone specifically due to prolonged Qtc (10/27 503 10/28 490). Also requiring adjustments for her heart health. I also had open and direct discussion with her regarding illicit drug use as patient was positive for cocaine use on admission. [name] acknowledge expressing "I slipped up. It was [employer] homecoming" I shared per pain contract she was to refain from use of illicit drugs in order to continue to receive pain management support. She verbalized understanding expressing dissatisfaction with not being allowed to receive prescription.   Patient is requesting to discuss with her Oncologist to refill. I advise that I would as I am not planning to refill at this time. I did offer to arrange for patient's care to be transferred to an outpatient pain management facility to continue prevent any lapse in her medication management. She has declined at this time expressing her request is for a refill only. I again politely confirmed I would not be refilling medication at this time.   Patient is aware I will discuss with Oncologist and someone will reach back out to notify her of final decisions.

## 2023-10-20 NOTE — Progress Notes (Signed)
Discharge instructions reviewed with pt.  Copy of instructions given to pt. [facility] has filled 2 new scripts for pt and they have been delivered to the pt in her room. Pt states she has several medications that were filled at [facility] pharmacy and were put back yesterday due to being in the hospital. This nurse reached out to [facility] pharmacy and they are able to refill those scripts here at [facility] and will be ready for pt to take home soon.   Pt needing paper scrubs to wear home, staff working on obtaining those for her. Pt will be taken down to the discharge lounge while she waits for her scripts to be filled and then a taxi cab will be called for her using cab voucher provided by [facility].    Pt will be d/c'd via wheelchair with belongings and she will be escorted by staff to the d/c lounge once paper scrubs obtained.   Aura Bibby,RN SWOT

## 2023-10-20 NOTE — Progress Notes (Signed)
Pain clinic referral placed, ordered faxed to [facility], conformation received.

## 2023-10-20 NOTE — Progress Notes (Signed)
   Heart Failure Stewardship Pharmacist Progress Note   PCP: Darral Dash, DO PCP-Cardiologist: Thomasene Ripple, DO    HPI:  62 yo F with PMH of HFrEF, HTN, CAD (MI - BMS to RCA in 2008; MI - DES to LAD 2009), HLD, lymphedema, substance use (cocaine), venous stasis, and breast cancer.   Presented to the ED on 10/27 with shortness of breath and chest pressure. Reports she ran out of her lasix and Jardiance 2 days prior. Last use of cocaine was a few days before admission. CXR with stable cardiomegaly, pulmonary vascular congestion, and small L pleural effusion. CTA negative for PE. BNP 689.4. ECHO 10/28 showed LVEF 40-45%, RWMA, mild asymmetric LVH, RV normal, mild MR.   Cardiac testing: EF 60-65% 09/2019  Underwent stress test 12/2019 - intermediate risk EF 60-65% 03/2022  EF 55-60% 06/2022 EF 35-40% 08/2023 Underwent repeat stress test 08/2023 - no new areas of ischemia  Discharge HF Medications: Diuretic: furosemide 40 mg daily (or 60 mg daily PRN weight gain) ACE/ARB/ARNI: losartan 25 mg daily MRA: spironolactone 25 mg daily SGLT2i: Jardiance 10 mg daily  Prior to admission HF Medications: Diuretic: furosemide 40 mg daily (or 60 mg daily PRN weight gain) Beta blocker: carvedilol 25 mg BID SGLT2i: Jardiance 10 mg daily  Pertinent Lab Values: Serum creatinine 0.93, BUN 14, Potassium 3.4, Sodium 138, BNP 689.4, Magnesium 2.0  Vital Signs: Weight: 203 lbs (admission weight: 203 lbs) Blood pressure: 120-130/80s  Heart rate: 70-80s  I/O: incomplete yesterday; net -1.1L since admission  Medication Assistance / Insurance Benefits Check: Does the patient have prescription insurance?  Yes Type of insurance plan: [place] [employer]  Outpatient Pharmacy:  Prior to admission outpatient pharmacy: [facility] Is the patient willing to use [facility] pharmacy at discharge? Yes   Assessment: 1. Acute on chronic systolic CHF (LVEF 40-45%), due to ICM. NYHA class III symptoms. - Continue furosemide 40  mg daily (or 60 mg daily PRN weight gain) at discharge. Strict I/Os and daily weights. Keep K>4 - replaced with KCl 40 mEq x 1 - and Mg>2. - Previously reported taking carvedilol 25 mg BID PTA. Dispense history does not support this. On interview, patient denies taking this PTA - has been off of it for several months. Holding for now with noncompliance, still volume overloaded. Educated the patient that this may end up being restarted in the future pending compliance and abstinence from drug use.  - Continue losartan 25 mg daily  - Agree with starting spironolactone 25 mg daily - Continue Jardiance 10 mg daily. It appears she was partially complaint with this (30 day supplies filled on 5/4, 7/5, and 9/14)  Plan: 1) Medication changes recommended at this time: - Agree with changes  2) Patient assistance: - None pending, has [place] - Will need all of her medications filled from [facility] pharmacy at discharge  3)  Education  - Patient has been educated on current HF medications and potential additions to HF medication regimen - Patient verbalizes understanding that over the next few months, these medication doses may change and more medications may be added to optimize HF regimen - Patient has been educated on basic disease state pathophysiology and goals of therapy - Patient is hesitant to start new medications but understands that these have been proven to keep her living longer, prevent hospitalizations, and help her feel better.    Sharen Hones, PharmD, BCPS Heart Failure Stewardship Pharmacist Phone [phone]

## 2023-10-20 NOTE — Progress Notes (Signed)
Physical Therapy Discharge Patient Details Name: [name] MRN: [id number] DOB: 1961/08/13 Today's Date: 10/20/2023 Time: 6295-2841 PT Time Calculation (min) (ACUTE ONLY): 10 min  Patient discharged from PT services secondary to goals met and no further PT needs identified.  Please see latest therapy progress note for current level of functioning and progress toward goals.    Progress and discharge plan discussed with patient and/or caregiver: Patient/Caregiver agrees with plan  GP     Bevelyn Buckles 10/20/2023, 11:41 AM  Tuleen Mandelbaum M,PT Acute Rehab Services [phone]

## 2023-10-20 NOTE — Telephone Encounter (Signed)
erroneous

## 2023-10-21 ENCOUNTER — Other Ambulatory Visit (HOSPITAL_COMMUNITY): Payer: Self-pay

## 2023-10-21 DIAGNOSIS — I5020 Unspecified systolic (congestive) heart failure: Secondary | ICD-10-CM | POA: Diagnosis not present

## 2023-10-22 DIAGNOSIS — I5020 Unspecified systolic (congestive) heart failure: Secondary | ICD-10-CM | POA: Diagnosis not present

## 2023-10-23 DIAGNOSIS — I5020 Unspecified systolic (congestive) heart failure: Secondary | ICD-10-CM | POA: Diagnosis not present

## 2023-10-24 ENCOUNTER — Telehealth: Payer: Medicaid Other

## 2023-10-24 ENCOUNTER — Telehealth: Payer: Self-pay

## 2023-10-24 ENCOUNTER — Ambulatory Visit: Payer: Self-pay | Admitting: Student

## 2023-10-24 DIAGNOSIS — I5020 Unspecified systolic (congestive) heart failure: Secondary | ICD-10-CM | POA: Diagnosis not present

## 2023-10-24 NOTE — Transitions of Care (Post Inpatient/ED Visit) (Signed)
10/24/2023  Name: [name] MRN: [id number] DOB: June 07, 1961  Today's TOC FU Call Status: Today's TOC FU Call Status:: Successful TOC FU Call Completed TOC FU Call Complete Date: 10/24/23 Patient's Name and Date of Birth confirmed.  Transition Care Management Follow-up Telephone Call Date of Discharge: 10/20/23 Discharge Facility: [facility]) Type of Discharge: Inpatient Admission Primary Inpatient Discharge Diagnosis:: Acute on Chronic Diastolic Heart Failure How have you been since you were released from the hospital?: Better Any questions or concerns?: Yes Patient Questions/Concerns:: Patient states she has a headache and is not going to her appointment wirh Dr. Marisue Humble this afternoon. Requested cancel appt. Patient Questions/Concerns Addressed: Notified Provider of Patient Questions/Concerns (Notified [facility], Amy that patient requests cancel of appt)  Items Reviewed: Did you receive and understand the discharge instructions provided?: Yes Medications obtained,verified, and reconciled?: Yes (Medications Reviewed) Any new allergies since your discharge?: No Dietary orders reviewed?: No Do you have support at home?: Yes People in Home: significant other Name of Support/Comfort Primary Source: [name]  Medications Reviewed Today: Medications Reviewed Today     Reviewed by Jodelle Gross, RN (Case Manager) on 10/24/23 at [phone]  Med List Status: <None>   Medication Order Taking? Sig Documenting Provider Last Dose Status Informant  allopurinol (ZYLOPRIM) 300 MG tablet [id number] No Take 1 tablet (300 mg total) by mouth daily.  Patient not taking: Reported on 10/17/2023   Darral Dash, DO Not Taking Active Self, Pharmacy Records  ascorbic acid (VITAMIN C) 500 MG tablet [id number] Yes Take 500 mg by mouth daily. [provider] Taking Active Self, Pharmacy Records  aspirin EC 81 MG tablet [id number] Yes Take 81 mg by mouth daily. [provider] Taking Active  Self, Pharmacy Records  cholecalciferol (VITAMIN D3) 25 MCG (1000 UNIT) tablet [id number] Yes Take 1,000 Units by mouth daily. [provider] Taking Active Self, Pharmacy Records  empagliflozin (JARDIANCE) 10 MG TABS tablet [id number] Yes Take 1 tablet (10 mg total) by mouth daily. Darral Dash, DO Taking Active Self, Pharmacy Records  ezetimibe (ZETIA) 10 MG tablet [id number] Yes Take 1 tablet (10 mg total) by mouth daily. Darral Dash, DO Taking Active Self, Pharmacy Records    Discontinued 03/10/12 1404 (Error)   fluticasone (FLONASE) 50 MCG/ACT nasal spray [id number] Yes Place 1 spray into both nostrils daily.  Patient taking differently: Place 1 spray into both nostrils daily as needed for allergies or rhinitis.   Darral Dash, DO Taking Active Self, Pharmacy Records  furosemide (LASIX) 20 MG tablet [id number] Yes Take 2 tablets (40 mg total) by mouth daily. TAKE AN EXTRA TABLET IF WEIGHT GAIN 3 LBS IN 24 HOURS OR 5 LBS IN A WEEK Dameron, Nolberto Hanlon, DO Taking Active Self, Pharmacy Records  isosorbide mononitrate (IMDUR) 60 MG 24 hr tablet [id number] No Take 1 tablet (60 mg total) by mouth at bedtime. Darral Dash, DO Unknown Active Self, Pharmacy Records           Med Note Sedonia Small Oct 17, 2023 12:58 PM) Dispense record does not support compliance.   losartan (COZAAR) 25 MG tablet [id number] Yes Take 1 tablet (25 mg total) by mouth daily. Darral Dash, DO Taking Active   medroxyPROGESTERone (PROVERA) 10 MG tablet [id number] Yes Take 1 tablet (10 mg total) by mouth daily. Carver Fila, MD Taking Active Self, Pharmacy Records  methadone (DOLOPHINE) 5 MG tablet [id number] Yes Take 2 tablets (10 mg total) by mouth every 8 (eight) hours for 15 days. Pickenpack-Cousar,  Arty Baumgartner, NP Taking Active   Multiple Vitamins-Minerals (HAIR SKIN & NAILS ADVANCED PO) [id number] Yes Take 1 capsule by mouth daily. [provider] Taking Active Self, Pharmacy Records   nitroGLYCERIN (NITROSTAT) 0.4 MG SL tablet [id number] Yes Place 1 tablet (0.4 mg total) under the tongue every 5 (five) minutes as needed for chest pain. Cathleen Corti, MD Taking Active Self, Pharmacy Records  spironolactone (ALDACTONE) 25 MG tablet [id number] Yes Take 1 tablet (25 mg total) by mouth daily. Darral Dash, DO Taking Active             Home Care and Equipment/Supplies: Were Home Health Services Ordered?: No Any new equipment or medical supplies ordered?: No  Functional Questionnaire: Do you need assistance with bathing/showering or dressing?: No Do you need assistance with meal preparation?: No Do you need assistance with eating?: No Do you have difficulty maintaining continence: No Do you need assistance with getting out of bed/getting out of a chair/moving?: No Do you have difficulty managing or taking your medications?: No  Follow up appointments reviewed: PCP Follow-up appointment confirmed?: Yes Date of PCP follow-up appointment?: 10/24/23 Follow-up Provider: Dr. Marisue Humble (Patient requested cancellation of this appointment, she has a headache and is not leaving her house) Specialist Hospital Follow-up appointment confirmed?: Yes Date of Specialist follow-up appointment?: 10/25/23 Follow-Up Specialty Provider:: Dr. Al Pimple Do you need transportation to your follow-up appointment?: No Do you understand care options if your condition(s) worsen?: Yes-patient verbalized understanding  SDOH Interventions Today    Flowsheet Row Most Recent Value  SDOH Interventions   Food Insecurity Interventions Intervention Not Indicated  Housing Interventions Intervention Not Indicated     Jodelle Gross RN, BSN, CCM RN Care Manager  Transitions of Care  [facility] - Population Health  [phone]

## 2023-10-25 ENCOUNTER — Inpatient Hospital Stay: Payer: Medicaid Other | Attending: Adult Health | Admitting: Hematology and Oncology

## 2023-10-25 DIAGNOSIS — I5020 Unspecified systolic (congestive) heart failure: Secondary | ICD-10-CM | POA: Diagnosis not present

## 2023-10-26 ENCOUNTER — Ambulatory Visit: Payer: Medicaid Other | Admitting: Obstetrics & Gynecology

## 2023-10-26 DIAGNOSIS — I5020 Unspecified systolic (congestive) heart failure: Secondary | ICD-10-CM | POA: Diagnosis not present

## 2023-10-27 DIAGNOSIS — I5020 Unspecified systolic (congestive) heart failure: Secondary | ICD-10-CM | POA: Diagnosis not present

## 2023-10-28 DIAGNOSIS — I5020 Unspecified systolic (congestive) heart failure: Secondary | ICD-10-CM | POA: Diagnosis not present

## 2023-10-29 DIAGNOSIS — I5020 Unspecified systolic (congestive) heart failure: Secondary | ICD-10-CM | POA: Diagnosis not present

## 2023-10-30 DIAGNOSIS — I5020 Unspecified systolic (congestive) heart failure: Secondary | ICD-10-CM | POA: Diagnosis not present

## 2023-10-31 ENCOUNTER — Encounter (HOSPITAL_COMMUNITY): Payer: Self-pay

## 2023-10-31 ENCOUNTER — Other Ambulatory Visit (HOSPITAL_COMMUNITY): Payer: Self-pay

## 2023-10-31 ENCOUNTER — Ambulatory Visit (HOSPITAL_COMMUNITY)
Admit: 2023-10-31 | Discharge: 2023-10-31 | Disposition: A | Discharge status: Home / Self Care | Payer: Medicaid Other | Source: Ambulatory Visit | Attending: Adult Health | Admitting: Adult Health

## 2023-10-31 VITALS — BP 142/88 | HR 86 | Wt 205.4 lb

## 2023-10-31 DIAGNOSIS — I255 Ischemic cardiomyopathy: Secondary | ICD-10-CM | POA: Insufficient documentation

## 2023-10-31 DIAGNOSIS — E785 Hyperlipidemia, unspecified: Secondary | ICD-10-CM | POA: Insufficient documentation

## 2023-10-31 DIAGNOSIS — I5022 Chronic systolic (congestive) heart failure: Secondary | ICD-10-CM | POA: Insufficient documentation

## 2023-10-31 DIAGNOSIS — Z91128 Patient's intentional underdosing of medication regimen for other reason: Secondary | ICD-10-CM | POA: Diagnosis not present

## 2023-10-31 DIAGNOSIS — I5042 Chronic combined systolic (congestive) and diastolic (congestive) heart failure: Secondary | ICD-10-CM

## 2023-10-31 DIAGNOSIS — N182 Chronic kidney disease, stage 2 (mild): Secondary | ICD-10-CM | POA: Insufficient documentation

## 2023-10-31 DIAGNOSIS — F17210 Nicotine dependence, cigarettes, uncomplicated: Secondary | ICD-10-CM | POA: Insufficient documentation

## 2023-10-31 DIAGNOSIS — R9431 Abnormal electrocardiogram [ECG] [EKG]: Secondary | ICD-10-CM | POA: Diagnosis not present

## 2023-10-31 DIAGNOSIS — Z9013 Acquired absence of bilateral breasts and nipples: Secondary | ICD-10-CM | POA: Diagnosis present

## 2023-10-31 DIAGNOSIS — Z853 Personal history of malignant neoplasm of breast: Secondary | ICD-10-CM | POA: Diagnosis present

## 2023-10-31 DIAGNOSIS — Z79891 Long term (current) use of opiate analgesic: Secondary | ICD-10-CM | POA: Diagnosis not present

## 2023-10-31 DIAGNOSIS — T50996A Underdosing of other drugs, medicaments and biological substances, initial encounter: Secondary | ICD-10-CM | POA: Diagnosis not present

## 2023-10-31 DIAGNOSIS — G894 Chronic pain syndrome: Secondary | ICD-10-CM | POA: Insufficient documentation

## 2023-10-31 DIAGNOSIS — I2510 Atherosclerotic heart disease of native coronary artery without angina pectoris: Secondary | ICD-10-CM | POA: Insufficient documentation

## 2023-10-31 DIAGNOSIS — Z8542 Personal history of malignant neoplasm of other parts of uterus: Secondary | ICD-10-CM | POA: Insufficient documentation

## 2023-10-31 DIAGNOSIS — F1410 Cocaine abuse, uncomplicated: Secondary | ICD-10-CM | POA: Diagnosis not present

## 2023-10-31 DIAGNOSIS — I130 Hypertensive heart and chronic kidney disease with heart failure and stage 1 through stage 4 chronic kidney disease, or unspecified chronic kidney disease: Secondary | ICD-10-CM | POA: Insufficient documentation

## 2023-10-31 DIAGNOSIS — Z955 Presence of coronary angioplasty implant and graft: Secondary | ICD-10-CM | POA: Insufficient documentation

## 2023-10-31 DIAGNOSIS — Z720 Tobacco use: Secondary | ICD-10-CM | POA: Diagnosis not present

## 2023-10-31 DIAGNOSIS — Z91148 Patient's other noncompliance with medication regimen for other reason: Secondary | ICD-10-CM

## 2023-10-31 DIAGNOSIS — I5020 Unspecified systolic (congestive) heart failure: Secondary | ICD-10-CM | POA: Diagnosis not present

## 2023-10-31 LAB — BASIC METABOLIC PANEL
Anion gap: 7 (ref 5–15)
BUN: 14 mg/dL (ref 6–20)
CO2: 25 mmol/L (ref 22–32)
Calcium: 9.3 mg/dL (ref 8.9–10.3)
Chloride: 106 mmol/L (ref 98–111)
Creatinine, Ser: 1.01 mg/dL — ABNORMAL HIGH (ref 0.44–1.00)
GFR, Estimated: >60 mL/min (ref >60)
Glucose, Bld: 100 mg/dL — ABNORMAL HIGH (ref 70–99)
Potassium: 3.9 mmol/L (ref 3.5–5.1)
Sodium: 138 mmol/L (ref 135–145)

## 2023-10-31 MED ORDER — LOSARTAN POTASSIUM 50 MG PO TABS
50.0000 mg | ORAL_TABLET | Freq: Every day | ORAL | 1 refills | Status: DC
  Filled 2023-10-31: qty 30, 30d supply, fill #0
  Filled 2023-11-28 – 2023-12-05 (×2): qty 30, 30d supply, fill #1
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
## 2023-10-31 NOTE — Patient Instructions (Signed)
Medication Changes:  INCREASE Losartan to 50 mg Daily  Lab Work:  Labs done today, your results will be available in MyChart, we will contact you for abnormal readings.  Special Instructions // Education:  Do the following things EVERYDAY: Weigh yourself in the morning before breakfast. Write it down and keep it in a log. Take your medicines as prescribed Eat low salt foods--Limit salt (sodium) to 2000 mg per day.  Stay as active as you can everyday Limit all fluids for the day to less than 2 liters   Follow-Up in: Thank you for allowing [name] to provider your heart failure care after your recent hospitalization. Please follow-up with Dr Servando Salina at [facility] as scheduled   If you have any questions, issues, or concerns before your next appointment please call our office at [phone], opt. 2 and leave a message for the triage nurse.

## 2023-11-01 DIAGNOSIS — I5020 Unspecified systolic (congestive) heart failure: Secondary | ICD-10-CM | POA: Diagnosis not present

## 2023-11-02 ENCOUNTER — Encounter: Payer: Self-pay | Admitting: Obstetrics & Gynecology

## 2023-11-02 ENCOUNTER — Other Ambulatory Visit (HOSPITAL_COMMUNITY): Payer: Self-pay

## 2023-11-02 ENCOUNTER — Telehealth: Payer: Self-pay

## 2023-11-02 ENCOUNTER — Ambulatory Visit (INDEPENDENT_AMBULATORY_CARE_PROVIDER_SITE_OTHER): Payer: Medicaid Other | Admitting: Obstetrics & Gynecology

## 2023-11-02 ENCOUNTER — Other Ambulatory Visit (HOSPITAL_COMMUNITY)
Admission: RE | Admit: 2023-11-02 | Discharge: 2023-11-02 | Disposition: A | Discharge status: Home / Self Care | Payer: Medicaid Other | Source: Ambulatory Visit | Attending: Obstetrics & Gynecology | Admitting: Obstetrics & Gynecology

## 2023-11-02 ENCOUNTER — Other Ambulatory Visit: Payer: Self-pay | Admitting: Nurse Practitioner

## 2023-11-02 VITALS — BP 105/61 | HR 82 | Ht 65.0 in | Wt 203.0 lb

## 2023-11-02 DIAGNOSIS — C50412 Malignant neoplasm of upper-outer quadrant of left female breast: Secondary | ICD-10-CM

## 2023-11-02 DIAGNOSIS — Z1339 Encounter for screening examination for other mental health and behavioral disorders: Secondary | ICD-10-CM | POA: Diagnosis not present

## 2023-11-02 DIAGNOSIS — I5020 Unspecified systolic (congestive) heart failure: Secondary | ICD-10-CM | POA: Diagnosis not present

## 2023-11-02 DIAGNOSIS — Z113 Encounter for screening for infections with a predominantly sexual mode of transmission: Secondary | ICD-10-CM | POA: Diagnosis not present

## 2023-11-02 DIAGNOSIS — Z01419 Encounter for gynecological examination (general) (routine) without abnormal findings: Secondary | ICD-10-CM

## 2023-11-02 DIAGNOSIS — F1120 Opioid dependence, uncomplicated: Secondary | ICD-10-CM

## 2023-11-02 MED ORDER — METHADONE HCL 5 MG PO TABS
10.0000 mg | ORAL_TABLET | Freq: Three times a day (TID) | ORAL | 0 refills | Status: AC
  Filled 2023-11-02: qty 35, 6d supply, fill #0

## 2023-11-02 NOTE — Telephone Encounter (Addendum)
This RN was made aware by patient the need for methadone maintenance. Patient reports [facility] has not called to schedule an appointment with pain management and patient will be out of Methadone tomorrow (11/14). First available appt with [facility] is 11/08/2023 at 5pm. Appointment scheduled for patient.

## 2023-11-02 NOTE — Progress Notes (Signed)
Subjective:     [name] is a 62 y.o. female here for a routine exam.  Current complaints: [name]. She reports that she wants her uterus removed due to fibroids. Reports that she missed surgery 5x with [name] and can't go back. She denies bleeding or pelvic pain. Fibroids are now asymptomatic.       Gynecologic History Patient's last menstrual period was 08/07/2011. Contraception: post menopausal status Last Pap: 06/28/2021. Results were: normal Last mammogram: last seen imaging ws 2012  Savannah reports that she was seen 3 months prev. I cannot find any results.   Obstetric History OB History  Gravida Para Term Preterm AB Living  1 0 0 0 0 0  SAB IAB Ectopic Multiple Live Births  0 0 0 0 0    # Outcome Date GA Lbr Len/2nd Weight Sex Type Anes PTL Lv  1 Gravida              The following portions of the patient's history were reviewed and updated as appropriate: allergies, current medications, past family history, past medical history, past social history, past surgical history, and problem list.  Review of Systems Pertinent items are noted in HPI.    Objective:  BP 105/61   Pulse 82   Ht 5\' 5"  (1.651 m)   Wt 203 lb (92.1 kg)   LMP 08/07/2011   BMI 33.78 kg/m   General Appearance:    Alert, cooperative, no distress, appears stated age  Head:    Normocephalic, without obvious abnormality, atraumatic  Eyes:    conjunctiva/corneas clear, EOM's intact, both eyes  Ears:    Normal external ear canals, both ears  Nose:   Nares normal, septum midline, mucosa normal, no drainage    or sinus tenderness  Throat:   Lips, mucosa, and tongue normal; teeth and gums normal  Neck:   Supple, symmetrical, trachea midline, no adenopathy;    thyroid:  no enlargement/tenderness/nodules  Back:     Symmetric, no curvature, ROM normal, no CVA tenderness  Lungs:     respirations unlabored  Chest Wall:    No tenderness or deformity   Heart:    Regular  rate and rhythm  Breast Exam:    No tenderness, masses, or nipple abnormality  Abdomen:     Soft, non-tender, bowel sounds active all four quadrants,    no masses, no organomegaly  Genitalia:    Normal female without lesion, discharge or tenderness   The uterus is enlarged and has an irreg contour. ~12 weeks sized. No palpable adnexal masses.   Extremities:   Extremities normal, atraumatic, no cyanosis or edema  Pulses:   2+ and symmetric all extremities  Skin:   Skin color, texture, turgor normal, no rashes or lesions     Assessment:    Healthy female exam.  Asymptomatic uterine fibroids. Due her many cancelled surgical appt, will not refer her to surgery at present. She expressed understanding. Offered UFE but, Savannah declined. All questions answered.   H/o breast cancer- has missed her f/u appts. We sent a message to get her reconnected with her provider. Last missed appt was last week.    Plan:  [name] was seen today for gynecologic exam.  Diagnoses and all orders for this visit:  Screening examination for STI -     Cervicovaginal ancillary only  Well woman exam with routine gynecological exam -     Cytology - PAP  F/u in 1 year or sooner prn   [name], M.D., [name]

## 2023-11-02 NOTE — Telephone Encounter (Signed)
This RN spoke with [facility] medical and confirmed that pt has and appt scheduled with them on November 19th at 5pm. RN called pt to confirm and pt confirmed appt date and time. Methadone refilled until the 19th when pt can go to [facility] medical appt. Pt verbalized understanding and gratitude. No further needs at this time.

## 2023-11-03 ENCOUNTER — Other Ambulatory Visit (HOSPITAL_COMMUNITY): Payer: Self-pay

## 2023-11-03 DIAGNOSIS — I5020 Unspecified systolic (congestive) heart failure: Secondary | ICD-10-CM | POA: Diagnosis not present

## 2023-11-04 ENCOUNTER — Other Ambulatory Visit (HOSPITAL_COMMUNITY): Payer: Self-pay

## 2023-11-04 DIAGNOSIS — I5020 Unspecified systolic (congestive) heart failure: Secondary | ICD-10-CM | POA: Diagnosis not present

## 2023-11-04 LAB — CERVICOVAGINAL ANCILLARY ONLY
Chlamydia: NEGATIVE
Comment: NEGATIVE
Comment: NEGATIVE
Comment: NORMAL
Neisseria Gonorrhea: NEGATIVE
Trichomonas: NEGATIVE

## 2023-11-05 DIAGNOSIS — I5020 Unspecified systolic (congestive) heart failure: Secondary | ICD-10-CM | POA: Diagnosis not present

## 2023-11-06 DIAGNOSIS — I5020 Unspecified systolic (congestive) heart failure: Secondary | ICD-10-CM | POA: Diagnosis not present

## 2023-11-07 DIAGNOSIS — I5020 Unspecified systolic (congestive) heart failure: Secondary | ICD-10-CM | POA: Diagnosis not present

## 2023-11-07 LAB — CYTOLOGY - PAP
Comment: NEGATIVE
Diagnosis: NEGATIVE
High risk HPV: NEGATIVE

## 2023-11-08 DIAGNOSIS — I5020 Unspecified systolic (congestive) heart failure: Secondary | ICD-10-CM | POA: Diagnosis not present

## 2023-11-09 ENCOUNTER — Telehealth: Payer: Self-pay | Admitting: Hematology and Oncology

## 2023-11-09 DIAGNOSIS — I5020 Unspecified systolic (congestive) heart failure: Secondary | ICD-10-CM | POA: Diagnosis not present

## 2023-11-09 NOTE — Telephone Encounter (Signed)
Per scheduling message left patient a voicemail regarding scheduling; left call back number when ready to schedule future appoinments

## 2023-11-10 DIAGNOSIS — I5020 Unspecified systolic (congestive) heart failure: Secondary | ICD-10-CM | POA: Diagnosis not present

## 2023-11-11 ENCOUNTER — Ambulatory Visit: Payer: Medicaid Other | Admitting: Cardiology

## 2023-11-11 ENCOUNTER — Telehealth: Payer: Self-pay | Admitting: Hematology and Oncology

## 2023-11-11 DIAGNOSIS — I5020 Unspecified systolic (congestive) heart failure: Secondary | ICD-10-CM | POA: Diagnosis not present

## 2023-11-11 NOTE — Telephone Encounter (Signed)
Spoke with patient confirming upcoming appointment  

## 2023-11-12 DIAGNOSIS — I5020 Unspecified systolic (congestive) heart failure: Secondary | ICD-10-CM | POA: Diagnosis not present

## 2023-11-13 DIAGNOSIS — I5020 Unspecified systolic (congestive) heart failure: Secondary | ICD-10-CM | POA: Diagnosis not present

## 2023-11-14 ENCOUNTER — Other Ambulatory Visit: Payer: Self-pay | Admitting: Nurse Practitioner

## 2023-11-14 ENCOUNTER — Other Ambulatory Visit (HOSPITAL_COMMUNITY): Payer: Self-pay

## 2023-11-14 DIAGNOSIS — F1120 Opioid dependence, uncomplicated: Secondary | ICD-10-CM

## 2023-11-14 DIAGNOSIS — C50412 Malignant neoplasm of upper-outer quadrant of left female breast: Secondary | ICD-10-CM

## 2023-11-14 DIAGNOSIS — Z79899 Other long term (current) drug therapy: Secondary | ICD-10-CM | POA: Diagnosis not present

## 2023-11-15 ENCOUNTER — Telehealth: Payer: Self-pay | Admitting: *Deleted

## 2023-11-15 DIAGNOSIS — I5020 Unspecified systolic (congestive) heart failure: Secondary | ICD-10-CM | POA: Diagnosis not present

## 2023-11-15 NOTE — Telephone Encounter (Signed)
This RN received at after hours [employer] fax this am from pt who called pm yesterday stating she is in pain and needs a refill on her methadone "urgent".  Noted in chart pt was given prescription earlier this month in amount to allow her to go to [facility] pain clinic for establishing care due to failure to be compliant with pain contract.  Per chart - pt was to be seen 11/19 at [facility].  This RN returned call - obtained identified VM- message left per above.  This note will be forwarded to the Palliative provider and nurse for review of communication.

## 2023-11-15 NOTE — Telephone Encounter (Signed)
Note to follow up -she was seen yesterday at 3 pm at [facility] ( she canceled and rescheduled the 19th appt) and was given a plan to order suboxone for pain management - she declined.   she does not have a follow up with them but may return when she wants to pursue therapy under them as recommended.

## 2023-11-16 ENCOUNTER — Other Ambulatory Visit (HOSPITAL_COMMUNITY): Payer: Self-pay

## 2023-11-16 ENCOUNTER — Telehealth: Payer: Self-pay

## 2023-11-16 ENCOUNTER — Telehealth (HOSPITAL_BASED_OUTPATIENT_CLINIC_OR_DEPARTMENT_OTHER): Payer: Medicaid Other | Admitting: Nurse Practitioner

## 2023-11-16 ENCOUNTER — Encounter: Payer: Self-pay | Admitting: Nurse Practitioner

## 2023-11-16 DIAGNOSIS — Z515 Encounter for palliative care: Secondary | ICD-10-CM | POA: Diagnosis not present

## 2023-11-16 DIAGNOSIS — G893 Neoplasm related pain (acute) (chronic): Secondary | ICD-10-CM

## 2023-11-16 DIAGNOSIS — I5020 Unspecified systolic (congestive) heart failure: Secondary | ICD-10-CM | POA: Diagnosis not present

## 2023-11-16 NOTE — Telephone Encounter (Signed)
Received notification from the [facility] pharmacy that patient was calling asking for methadone and reporting "not feeling" well. [facility] pharmacist made the palliative office aware and this RN called the patient. [name] reported "not feeling well", feeling weak, tired, and shaky, [name] confirmed going to [facility] to transfer her pain management per her discussion with Lowella Bandy, NP. The patient reported a negative experience at the clinic and a disagreement with the provider's plan to manage her pain and therefore did not pick up the new medications that the provider at [facility] planned to prescribe. [name] has been largely without her methadone for a week despite her also reporting that she "has a few methadone pills left to hold on to." [name] was urged to come into the clinic multiple times to be seen, patient continued to decline saying " I can't come just make an appointment for next week". RN informed Lowella Bandy, NP of patient situation and symptoms, NP then spoke to patient re-explaining why she was discontinued from the palliative practice, why she was sent to [facility] medical, and what the next steps that patient would have to take. [name] was informed that due to her recent EKG findings paired with her recent use of illicit drugs, the office would not be prescribing methadone for her. It was explained to [name] she was previously bridged until her appointment with [facility] and her decision to no longer be seen by the [facility] provider was her own decision and the palliative office would not be re-establishing care and re-prescribing her methadone. [name] was instructed to call [facility] to request another provider or to call the office if she wishes for a referral to the methadone clinic. [name] was also given strict ED precautions should her withdrawal symptoms worsen or should she feel like she be seen urgently. [name] verbalized understanding of discussion and  education, and verbalized a plan to call [facility] to see another provider at that office. No further needs at this time.

## 2023-11-16 NOTE — Telephone Encounter (Signed)
I connected with [name] on 11/16/2023 at 0840 by telephone and verified that I am speaking with the correct person using two identifiers.   Other persons participating in the visit and their role in the encounter: None   Patient's location: Home  Provider's location: [facility]   Chief Complaint: Methadone Management    [name] contacted the office requesting refill for her methadone prescription explaining she does not have pills left and fearful of withdrawals. Patient recently referred to [facility] for ongoing pain management with understanding she will no longer be under my care here at [facility] for her pain needs after violation of pain contract with use of Cocaine. Patient also with hospitalization where she was treated for cardiac needs related to substance use. At the time of referral [name] verbalized understanding of plan of care. Patient was given a refill of her methadone to provide her coverage until she was able to be seen by [facility] to prevent lapse in pain management.   [name] was seen by [facility] on November 08, 2023 as scheduled however shares she was not in agreement with provider's recommendations. He wanted to initiate Suboxone and discontinue use of methadone which patient is not in agreement with. She chose to leave appointment.   During our call today I advised patient that she would have to be open to considering change as requested by outside provider given they are now managing her pain. She does not wish to discontinue use of her methadone. I have previously offered patient a referral to the Methadone Clinic however she declined. I again provided education and offered Methadone Clinic referral given her expressed dissatisfaction of recent visit at [facility] specifically with seen provider. Patient again declines referral to Methadone clinic. I suggested to patient to contact Toma Copier if her concern was with seeing a different  provider to request to be seen by another available team member to allow for continued care given she is now established in their system. She verbalized understanding and appreciation expressing she would plan to proceed with this recommendation.   In the interim and in light of upcoming holiday weekend, I did offer patient to come into the clinic for urine UDS and EKG which would allow me comfort in seeing patient and providing additional 1-2 week of prescription methadone with hopes she can be seen for follow-up at [facility]. Patient unfortunately declines expressing her Significant Other is using the car to travel and she has no other means to come in for appointment. Attempts to brainstorm transportation options however patient expressed wishes for appointment next week. I advised she must be seen and have requested test prior to receiving prescription. Patient declined visit for today.   Education provided and patient advised to seek emergency assistance in the setting of methadone withdrawal symptoms given inability to come in to be seen.   No further interventions or orders to be placed by writer at this time. Writer has discussed plan of care with Medical Director (Dr. Anderson Malta) and plan is most appropriate with all possible efforts presented.   I provided 45 minutes of non face-to-face telephone visit time during this encounter, and > 50% was spent counseling as documented under my assessment & plan.

## 2023-11-17 DIAGNOSIS — I5020 Unspecified systolic (congestive) heart failure: Secondary | ICD-10-CM | POA: Diagnosis not present

## 2023-11-18 DIAGNOSIS — I5020 Unspecified systolic (congestive) heart failure: Secondary | ICD-10-CM | POA: Diagnosis not present

## 2023-11-19 DIAGNOSIS — I5020 Unspecified systolic (congestive) heart failure: Secondary | ICD-10-CM | POA: Diagnosis not present

## 2023-11-20 DIAGNOSIS — I5020 Unspecified systolic (congestive) heart failure: Secondary | ICD-10-CM | POA: Diagnosis not present

## 2023-11-21 DIAGNOSIS — I5020 Unspecified systolic (congestive) heart failure: Secondary | ICD-10-CM | POA: Diagnosis not present

## 2023-11-22 DIAGNOSIS — I5020 Unspecified systolic (congestive) heart failure: Secondary | ICD-10-CM | POA: Diagnosis not present

## 2023-11-23 DIAGNOSIS — I5020 Unspecified systolic (congestive) heart failure: Secondary | ICD-10-CM | POA: Diagnosis not present

## 2023-11-24 DIAGNOSIS — I5020 Unspecified systolic (congestive) heart failure: Secondary | ICD-10-CM | POA: Diagnosis not present

## 2023-11-25 ENCOUNTER — Inpatient Hospital Stay: Payer: Medicaid Other | Attending: Adult Health | Admitting: Hematology and Oncology

## 2023-11-25 DIAGNOSIS — I5020 Unspecified systolic (congestive) heart failure: Secondary | ICD-10-CM | POA: Diagnosis not present

## 2023-11-26 DIAGNOSIS — I5020 Unspecified systolic (congestive) heart failure: Secondary | ICD-10-CM | POA: Diagnosis not present

## 2023-11-27 DIAGNOSIS — I5020 Unspecified systolic (congestive) heart failure: Secondary | ICD-10-CM | POA: Diagnosis not present

## 2023-11-28 ENCOUNTER — Other Ambulatory Visit: Payer: Self-pay

## 2023-11-28 ENCOUNTER — Other Ambulatory Visit: Payer: Self-pay | Admitting: Student

## 2023-11-28 ENCOUNTER — Other Ambulatory Visit (HOSPITAL_COMMUNITY): Payer: Self-pay

## 2023-11-28 DIAGNOSIS — I5020 Unspecified systolic (congestive) heart failure: Secondary | ICD-10-CM | POA: Diagnosis not present

## 2023-11-28 MED ORDER — EMPAGLIFLOZIN 10 MG PO TABS
10.0000 mg | ORAL_TABLET | Freq: Every day | ORAL | 1 refills | Status: DC
  Filled 2023-11-28 – 2023-12-05 (×3): qty 30, 30d supply, fill #0
  Filled 2024-01-04 – 2024-01-10 (×2): qty 30, 30d supply, fill #1

## 2023-11-28 MED ORDER — SPIRONOLACTONE 25 MG PO TABS
25.0000 mg | ORAL_TABLET | Freq: Every day | ORAL | 0 refills | Status: DC
  Filled 2023-11-28 – 2023-12-05 (×3): qty 30, 30d supply, fill #0

## 2023-11-28 MED ORDER — FUROSEMIDE 20 MG PO TABS
40.0000 mg | ORAL_TABLET | Freq: Every day | ORAL | 0 refills | Status: DC
  Filled 2023-11-28 – 2023-12-05 (×2): qty 60, 30d supply, fill #0

## 2023-11-29 ENCOUNTER — Ambulatory Visit (INDEPENDENT_AMBULATORY_CARE_PROVIDER_SITE_OTHER): Payer: Medicaid Other | Admitting: Student

## 2023-11-29 ENCOUNTER — Other Ambulatory Visit: Payer: Self-pay | Admitting: Student

## 2023-11-29 ENCOUNTER — Encounter: Payer: Self-pay | Admitting: Student

## 2023-11-29 ENCOUNTER — Other Ambulatory Visit (HOSPITAL_COMMUNITY): Payer: Self-pay

## 2023-11-29 ENCOUNTER — Ambulatory Visit: Payer: Medicaid Other | Attending: Cardiology | Admitting: Cardiology

## 2023-11-29 VITALS — BP 150/88 | HR 91 | Ht 65.0 in | Wt 211.0 lb

## 2023-11-29 DIAGNOSIS — G8929 Other chronic pain: Secondary | ICD-10-CM | POA: Diagnosis present

## 2023-11-29 DIAGNOSIS — I10 Essential (primary) hypertension: Secondary | ICD-10-CM

## 2023-11-29 DIAGNOSIS — I890 Lymphedema, not elsewhere classified: Secondary | ICD-10-CM | POA: Diagnosis not present

## 2023-11-29 DIAGNOSIS — F1120 Opioid dependence, uncomplicated: Secondary | ICD-10-CM

## 2023-11-29 DIAGNOSIS — C50412 Malignant neoplasm of upper-outer quadrant of left female breast: Secondary | ICD-10-CM

## 2023-11-29 DIAGNOSIS — I5020 Unspecified systolic (congestive) heart failure: Secondary | ICD-10-CM | POA: Diagnosis not present

## 2023-11-29 NOTE — Patient Instructions (Signed)
It was great seeing you today.  As we discussed, - I sent a referral to pain management - Use ice/heat/tylenol as needed - Try to get in contact with lymphedema clinic for new compression sleeve   If you have any questions or concerns, please feel free to call the clinic.   Have a wonderful day,  Dr. Darral Dash [facility] [phone]
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
## 2023-11-30 DIAGNOSIS — I5020 Unspecified systolic (congestive) heart failure: Secondary | ICD-10-CM | POA: Diagnosis not present

## 2023-12-01 ENCOUNTER — Encounter: Payer: Self-pay | Admitting: Student

## 2023-12-01 ENCOUNTER — Ambulatory Visit: Payer: Medicaid Other | Admitting: Student

## 2023-12-01 ENCOUNTER — Other Ambulatory Visit: Payer: Self-pay | Admitting: Nurse Practitioner

## 2023-12-01 ENCOUNTER — Other Ambulatory Visit (HOSPITAL_COMMUNITY): Payer: Self-pay

## 2023-12-01 ENCOUNTER — Telehealth: Payer: Self-pay | Admitting: Nurse Practitioner

## 2023-12-01 DIAGNOSIS — I5020 Unspecified systolic (congestive) heart failure: Secondary | ICD-10-CM | POA: Diagnosis not present

## 2023-12-01 NOTE — Telephone Encounter (Signed)
Received several voicemails from patient requesting prescription for methadone in addition to another referral to a different pain clinic.   Attempted to reach patient however unable to reach. HIPAA appropriate voice message left on patient's voicemail that identified her name informing her that I am not able to call in additional refills for requested prescription. Last refill was sent in on 11/13. I am also not able to send patient to another pain clinic as she was referred to [facility] and patient was seen at their office as scheduled however did not agree with their recommended treatment plan.   Of note patient was given enough methadone on 11/13 to to allow her coverage to be seen at [facility]. She declined to have referral to methadone clinic initially. Patient is aware at the time of referral she was discharged from Palliative services at [facility] due to failure to comply with signed pain contract due to illicit drug use (cocaine). Patient verbalized understanding at the time of referral and final written prescription in addition to several occasions post prescription.   Due diligence to ensure patient's transition in care has been suffice and all efforts made as best possible to support patient despite situation.

## 2023-12-01 NOTE — Assessment & Plan Note (Signed)
Most bothersome report of pain is in her left arm. On exam, she does not have any concerning swelling or signs of infection. Encouraged her to return to the lymphedema clinic and get compression sleeves to help alleviate some of her pain. Discussed that I am unable to prescribe methadone today.  Encouraged other forms of analgesic with Tylenol, lidocaine patches, compression as above.

## 2023-12-01 NOTE — Assessment & Plan Note (Signed)
Not at goal, blood pressure 150/88 today in clinic. She had a recent increase in losartan with her cardiologist last month to 50 mg daily. Concern remains for not taking medications as prescribed. I encouraged her to take her diuretic, beta-blocker and ARB as prescribed especially in light of her chronic heart failure. Return in 2 weeks for blood pressure check, if necessary at that time we will increase losartan to 100 mg daily.

## 2023-12-02 DIAGNOSIS — I5020 Unspecified systolic (congestive) heart failure: Secondary | ICD-10-CM | POA: Diagnosis not present

## 2023-12-03 DIAGNOSIS — I5020 Unspecified systolic (congestive) heart failure: Secondary | ICD-10-CM | POA: Diagnosis not present

## 2023-12-04 DIAGNOSIS — I5020 Unspecified systolic (congestive) heart failure: Secondary | ICD-10-CM | POA: Diagnosis not present

## 2023-12-05 ENCOUNTER — Other Ambulatory Visit (HOSPITAL_COMMUNITY): Payer: Self-pay

## 2023-12-05 ENCOUNTER — Other Ambulatory Visit: Payer: Self-pay

## 2023-12-05 DIAGNOSIS — I5020 Unspecified systolic (congestive) heart failure: Secondary | ICD-10-CM | POA: Diagnosis not present

## 2023-12-06 DIAGNOSIS — I5020 Unspecified systolic (congestive) heart failure: Secondary | ICD-10-CM | POA: Diagnosis not present

## 2023-12-07 DIAGNOSIS — I5020 Unspecified systolic (congestive) heart failure: Secondary | ICD-10-CM | POA: Diagnosis not present

## 2023-12-08 DIAGNOSIS — I5020 Unspecified systolic (congestive) heart failure: Secondary | ICD-10-CM | POA: Diagnosis not present

## 2023-12-09 DIAGNOSIS — I5020 Unspecified systolic (congestive) heart failure: Secondary | ICD-10-CM | POA: Diagnosis not present

## 2023-12-10 DIAGNOSIS — I5020 Unspecified systolic (congestive) heart failure: Secondary | ICD-10-CM | POA: Diagnosis not present

## 2023-12-11 DIAGNOSIS — I5020 Unspecified systolic (congestive) heart failure: Secondary | ICD-10-CM | POA: Diagnosis not present

## 2023-12-12 DIAGNOSIS — I5020 Unspecified systolic (congestive) heart failure: Secondary | ICD-10-CM | POA: Diagnosis not present

## 2023-12-13 DIAGNOSIS — I5020 Unspecified systolic (congestive) heart failure: Secondary | ICD-10-CM | POA: Diagnosis not present

## 2023-12-14 DIAGNOSIS — I5020 Unspecified systolic (congestive) heart failure: Secondary | ICD-10-CM | POA: Diagnosis not present

## 2023-12-15 DIAGNOSIS — I5020 Unspecified systolic (congestive) heart failure: Secondary | ICD-10-CM | POA: Diagnosis not present

## 2023-12-16 DIAGNOSIS — I5020 Unspecified systolic (congestive) heart failure: Secondary | ICD-10-CM | POA: Diagnosis not present

## 2023-12-17 DIAGNOSIS — I5020 Unspecified systolic (congestive) heart failure: Secondary | ICD-10-CM | POA: Diagnosis not present

## 2023-12-18 DIAGNOSIS — I5020 Unspecified systolic (congestive) heart failure: Secondary | ICD-10-CM | POA: Diagnosis not present

## 2023-12-19 DIAGNOSIS — I5020 Unspecified systolic (congestive) heart failure: Secondary | ICD-10-CM | POA: Diagnosis not present

## 2023-12-20 DIAGNOSIS — I5020 Unspecified systolic (congestive) heart failure: Secondary | ICD-10-CM | POA: Diagnosis not present

## 2023-12-21 DIAGNOSIS — I5020 Unspecified systolic (congestive) heart failure: Secondary | ICD-10-CM | POA: Diagnosis not present

## 2023-12-22 DIAGNOSIS — I5020 Unspecified systolic (congestive) heart failure: Secondary | ICD-10-CM | POA: Diagnosis not present

## 2023-12-23 DIAGNOSIS — I5020 Unspecified systolic (congestive) heart failure: Secondary | ICD-10-CM | POA: Diagnosis not present

## 2023-12-24 DIAGNOSIS — I5020 Unspecified systolic (congestive) heart failure: Secondary | ICD-10-CM | POA: Diagnosis not present

## 2023-12-25 DIAGNOSIS — I5020 Unspecified systolic (congestive) heart failure: Secondary | ICD-10-CM | POA: Diagnosis not present

## 2023-12-26 DIAGNOSIS — I5020 Unspecified systolic (congestive) heart failure: Secondary | ICD-10-CM | POA: Diagnosis not present

## 2023-12-27 DIAGNOSIS — I5020 Unspecified systolic (congestive) heart failure: Secondary | ICD-10-CM | POA: Diagnosis not present

## 2023-12-28 ENCOUNTER — Inpatient Hospital Stay: Payer: Medicaid Other | Admitting: Hematology and Oncology

## 2023-12-28 DIAGNOSIS — I5020 Unspecified systolic (congestive) heart failure: Secondary | ICD-10-CM | POA: Diagnosis not present

## 2023-12-29 DIAGNOSIS — I5020 Unspecified systolic (congestive) heart failure: Secondary | ICD-10-CM | POA: Diagnosis not present

## 2023-12-30 DIAGNOSIS — I5020 Unspecified systolic (congestive) heart failure: Secondary | ICD-10-CM | POA: Diagnosis not present

## 2023-12-31 DIAGNOSIS — I5020 Unspecified systolic (congestive) heart failure: Secondary | ICD-10-CM | POA: Diagnosis not present

## 2024-01-01 DIAGNOSIS — I5020 Unspecified systolic (congestive) heart failure: Secondary | ICD-10-CM | POA: Diagnosis not present

## 2024-01-02 DIAGNOSIS — Z9011 Acquired absence of right breast and nipple: Secondary | ICD-10-CM | POA: Diagnosis not present

## 2024-01-02 DIAGNOSIS — C50912 Malignant neoplasm of unspecified site of left female breast: Secondary | ICD-10-CM | POA: Diagnosis not present

## 2024-01-02 DIAGNOSIS — I5020 Unspecified systolic (congestive) heart failure: Secondary | ICD-10-CM | POA: Diagnosis not present

## 2024-01-03 DIAGNOSIS — I5020 Unspecified systolic (congestive) heart failure: Secondary | ICD-10-CM | POA: Diagnosis not present

## 2024-01-04 ENCOUNTER — Other Ambulatory Visit (HOSPITAL_COMMUNITY): Payer: Self-pay

## 2024-01-04 ENCOUNTER — Other Ambulatory Visit (HOSPITAL_COMMUNITY): Payer: Self-pay | Admitting: Cardiology

## 2024-01-04 ENCOUNTER — Other Ambulatory Visit: Payer: Self-pay | Admitting: Student

## 2024-01-04 ENCOUNTER — Other Ambulatory Visit: Payer: Self-pay

## 2024-01-04 DIAGNOSIS — I5020 Unspecified systolic (congestive) heart failure: Secondary | ICD-10-CM | POA: Diagnosis not present

## 2024-01-04 MED ORDER — LOSARTAN POTASSIUM 50 MG PO TABS
50.0000 mg | ORAL_TABLET | Freq: Every day | ORAL | 5 refills | Status: DC
  Filled 2024-01-04 – 2024-01-10 (×2): qty 30, 30d supply, fill #0
  Filled 2024-02-13 – 2024-02-20 (×2): qty 30, 30d supply, fill #1
  Filled 2024-03-26: qty 30, 30d supply, fill #2

## 2024-01-05 ENCOUNTER — Other Ambulatory Visit (HOSPITAL_COMMUNITY): Payer: Self-pay

## 2024-01-05 DIAGNOSIS — I5020 Unspecified systolic (congestive) heart failure: Secondary | ICD-10-CM | POA: Diagnosis not present

## 2024-01-05 MED ORDER — EZETIMIBE 10 MG PO TABS
10.0000 mg | ORAL_TABLET | Freq: Every day | ORAL | 1 refills | Status: DC
  Filled 2024-01-05 – 2024-01-10 (×2): qty 30, 30d supply, fill #0
  Filled 2024-02-13 – 2024-02-20 (×2): qty 30, 30d supply, fill #1

## 2024-01-05 MED ORDER — FUROSEMIDE 20 MG PO TABS
40.0000 mg | ORAL_TABLET | Freq: Every day | ORAL | 0 refills | Status: DC
  Filled 2024-01-05 – 2024-01-10 (×2): qty 60, 30d supply, fill #0

## 2024-01-05 MED ORDER — SPIRONOLACTONE 25 MG PO TABS
25.0000 mg | ORAL_TABLET | Freq: Every day | ORAL | 0 refills | Status: DC
  Filled 2024-01-05 – 2024-01-10 (×2): qty 30, 30d supply, fill #0

## 2024-01-06 DIAGNOSIS — I5020 Unspecified systolic (congestive) heart failure: Secondary | ICD-10-CM | POA: Diagnosis not present

## 2024-01-07 DIAGNOSIS — I5020 Unspecified systolic (congestive) heart failure: Secondary | ICD-10-CM | POA: Diagnosis not present

## 2024-01-08 DIAGNOSIS — I5020 Unspecified systolic (congestive) heart failure: Secondary | ICD-10-CM | POA: Diagnosis not present

## 2024-01-09 DIAGNOSIS — I5020 Unspecified systolic (congestive) heart failure: Secondary | ICD-10-CM | POA: Diagnosis not present

## 2024-01-10 ENCOUNTER — Other Ambulatory Visit: Payer: Self-pay

## 2024-01-10 ENCOUNTER — Other Ambulatory Visit (HOSPITAL_COMMUNITY): Payer: Self-pay

## 2024-01-10 DIAGNOSIS — I5020 Unspecified systolic (congestive) heart failure: Secondary | ICD-10-CM | POA: Diagnosis not present

## 2024-01-11 ENCOUNTER — Inpatient Hospital Stay: Payer: Medicaid Other | Attending: Adult Health | Admitting: Hematology and Oncology

## 2024-01-11 DIAGNOSIS — I5020 Unspecified systolic (congestive) heart failure: Secondary | ICD-10-CM | POA: Diagnosis not present

## 2024-01-11 DIAGNOSIS — C50412 Malignant neoplasm of upper-outer quadrant of left female breast: Secondary | ICD-10-CM

## 2024-01-11 NOTE — Progress Notes (Unsigned)
Pt is no show today again. At this time since she has been cancer free for more than 10 yrs, I will discharge her to survivorship clinic. She will follow up with Mardella Layman as needed. We are unable to assist with the pain management for [name] given her recent hospitalization with evidence of drug use. She will have to continue follow up with pain management clinic as directed by our pall care team.   Rachel Moulds MD

## 2024-01-12 ENCOUNTER — Telehealth: Payer: Self-pay | Admitting: *Deleted

## 2024-01-12 ENCOUNTER — Other Ambulatory Visit: Payer: Self-pay | Admitting: *Deleted

## 2024-01-12 DIAGNOSIS — I890 Lymphedema, not elsewhere classified: Secondary | ICD-10-CM

## 2024-01-12 DIAGNOSIS — C50412 Malignant neoplasm of upper-outer quadrant of left female breast: Secondary | ICD-10-CM

## 2024-01-12 DIAGNOSIS — Z9013 Acquired absence of bilateral breasts and nipples: Secondary | ICD-10-CM

## 2024-01-12 DIAGNOSIS — I5020 Unspecified systolic (congestive) heart failure: Secondary | ICD-10-CM | POA: Diagnosis not present

## 2024-01-12 NOTE — Telephone Encounter (Signed)
Pt called and left VM stating she was unable to come to appt yesterday due to "the bad weather and this pain is killing me" She is requesting to reschedule appt as well as "talk with Dr Al Pimple about getting me back on the methadone "  This  RN returned call and reviewed with her Dr Remonia Richter note regarding medical follow recommendations per this office.  She presently does not need to be seen by oncologist per completion of therapy.   She can be seen by survivorship.  Discussed MD will not refill methadone with recommendation last year for establishing care with palliative for management of post chemo/lymphedema pain.  [name] is very adamant about re establishing use of methadone " it never bothered my heart "  This RN discussed referral to an other pain clinic post issues with palliative in this office who offered management with suboxone.  This RN stated known pain relief per above drug with [name] very adamant about not using due to " it made me itch all over, the percocet does the same - makes me look like some heroin addict"  This RN informed pt at this time appt for survivorship can be made per her concerns relating to cancer as well as new referral placed for the lymphedema clinic.

## 2024-01-13 DIAGNOSIS — I5020 Unspecified systolic (congestive) heart failure: Secondary | ICD-10-CM | POA: Diagnosis not present

## 2024-01-14 DIAGNOSIS — I5020 Unspecified systolic (congestive) heart failure: Secondary | ICD-10-CM | POA: Diagnosis not present

## 2024-01-15 DIAGNOSIS — I5020 Unspecified systolic (congestive) heart failure: Secondary | ICD-10-CM | POA: Diagnosis not present

## 2024-01-16 ENCOUNTER — Telehealth: Payer: Self-pay

## 2024-01-16 DIAGNOSIS — I5020 Unspecified systolic (congestive) heart failure: Secondary | ICD-10-CM | POA: Diagnosis not present

## 2024-01-16 NOTE — Telephone Encounter (Signed)
Received call from [facility] regarding form completion for [facility] services.   She states that this was faxed last week and wanted to check the status.   Forms in PCP box for completion.   Contact number for Megan: [phone]  Veronda Prude, RN

## 2024-01-17 ENCOUNTER — Telehealth: Payer: Self-pay | Admitting: *Deleted

## 2024-01-17 ENCOUNTER — Inpatient Hospital Stay: Payer: Medicaid Other | Admitting: Adult Health

## 2024-01-17 ENCOUNTER — Other Ambulatory Visit (HOSPITAL_COMMUNITY): Payer: Self-pay

## 2024-01-17 DIAGNOSIS — I5020 Unspecified systolic (congestive) heart failure: Secondary | ICD-10-CM | POA: Diagnosis not present

## 2024-01-17 NOTE — Telephone Encounter (Signed)
Pt left VM this AM stating " please do me a favor and reschedule my appt to next week with Mardella Layman"  " I am having diarrhea and will not leave the house"  " I am scheduled for PT next week but let Mardella Layman know this arm is in excruciating - I mean excruciating pain" ( Of note- pt was being followed by palliative for pain management - due to break in contract pt was deferred to other pain facilities x 2 with pt not following up post initial visit due to plan given not feasible for her)   This RN canceled appt and this note will be forwarded to provider.  Appt request will be sent as well.

## 2024-01-18 DIAGNOSIS — I5020 Unspecified systolic (congestive) heart failure: Secondary | ICD-10-CM | POA: Diagnosis not present

## 2024-01-18 NOTE — Telephone Encounter (Signed)
Received returned call from [facility] regarding this form.   She states that they have been trying to get this form for the last month.   Advised that as of Monday, we have received forms and they are in provider's box for completion.   Advised of protocol for forms. She states that she will call back on Monday if they have not received them.   Veronda Prude, RN

## 2024-01-19 DIAGNOSIS — I5020 Unspecified systolic (congestive) heart failure: Secondary | ICD-10-CM | POA: Diagnosis not present

## 2024-01-20 DIAGNOSIS — I5020 Unspecified systolic (congestive) heart failure: Secondary | ICD-10-CM | POA: Diagnosis not present

## 2024-01-21 DIAGNOSIS — I5020 Unspecified systolic (congestive) heart failure: Secondary | ICD-10-CM | POA: Diagnosis not present

## 2024-01-22 DIAGNOSIS — I5020 Unspecified systolic (congestive) heart failure: Secondary | ICD-10-CM | POA: Diagnosis not present

## 2024-01-23 ENCOUNTER — Telehealth: Payer: Self-pay | Admitting: *Deleted

## 2024-01-23 DIAGNOSIS — I5020 Unspecified systolic (congestive) heart failure: Secondary | ICD-10-CM | POA: Diagnosis not present

## 2024-01-23 NOTE — Telephone Encounter (Signed)
Message left by pt on RN VM stating need to reschedule missed appt with Lillard Anes on 01/17/2024.  Message sent to scheduling.

## 2024-01-24 ENCOUNTER — Telehealth: Payer: Self-pay | Admitting: Internal Medicine

## 2024-01-24 DIAGNOSIS — I5020 Unspecified systolic (congestive) heart failure: Secondary | ICD-10-CM | POA: Diagnosis not present
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
## 2024-01-24 NOTE — Telephone Encounter (Signed)
Rescheduled appointment per 2/3 scheduling message. Patient is aware of the changes made and is active on MyChart.

## 2024-01-25 ENCOUNTER — Other Ambulatory Visit: Payer: Self-pay

## 2024-01-25 ENCOUNTER — Encounter: Payer: Self-pay | Admitting: Rehabilitation

## 2024-01-25 ENCOUNTER — Ambulatory Visit: Payer: Medicaid Other | Attending: Adult Health | Admitting: Rehabilitation

## 2024-01-25 DIAGNOSIS — M79602 Pain in left arm: Secondary | ICD-10-CM | POA: Diagnosis present

## 2024-01-25 DIAGNOSIS — Z483 Aftercare following surgery for neoplasm: Secondary | ICD-10-CM

## 2024-01-25 DIAGNOSIS — I5020 Unspecified systolic (congestive) heart failure: Secondary | ICD-10-CM | POA: Diagnosis not present

## 2024-01-25 DIAGNOSIS — Z9013 Acquired absence of bilateral breasts and nipples: Secondary | ICD-10-CM | POA: Insufficient documentation

## 2024-01-25 DIAGNOSIS — C50412 Malignant neoplasm of upper-outer quadrant of left female breast: Secondary | ICD-10-CM

## 2024-01-25 DIAGNOSIS — I890 Lymphedema, not elsewhere classified: Secondary | ICD-10-CM

## 2024-01-26 DIAGNOSIS — I5020 Unspecified systolic (congestive) heart failure: Secondary | ICD-10-CM | POA: Diagnosis not present

## 2024-01-27 DIAGNOSIS — I5020 Unspecified systolic (congestive) heart failure: Secondary | ICD-10-CM | POA: Diagnosis not present

## 2024-01-28 DIAGNOSIS — I5020 Unspecified systolic (congestive) heart failure: Secondary | ICD-10-CM | POA: Diagnosis not present

## 2024-01-29 DIAGNOSIS — I5020 Unspecified systolic (congestive) heart failure: Secondary | ICD-10-CM | POA: Diagnosis not present

## 2024-01-30 DIAGNOSIS — I5020 Unspecified systolic (congestive) heart failure: Secondary | ICD-10-CM | POA: Diagnosis not present

## 2024-01-31 ENCOUNTER — Inpatient Hospital Stay: Payer: Medicaid Other | Attending: Adult Health | Admitting: Adult Health

## 2024-01-31 ENCOUNTER — Telehealth: Payer: Self-pay

## 2024-01-31 VITALS — BP 148/87 | HR 77 | Temp 97.7°F | Resp 16 | Wt 202.9 lb

## 2024-01-31 DIAGNOSIS — I5020 Unspecified systolic (congestive) heart failure: Secondary | ICD-10-CM | POA: Diagnosis not present

## 2024-01-31 DIAGNOSIS — Z853 Personal history of malignant neoplasm of breast: Secondary | ICD-10-CM | POA: Insufficient documentation

## 2024-01-31 DIAGNOSIS — F17210 Nicotine dependence, cigarettes, uncomplicated: Secondary | ICD-10-CM | POA: Diagnosis not present

## 2024-01-31 DIAGNOSIS — C50412 Malignant neoplasm of upper-outer quadrant of left female breast: Secondary | ICD-10-CM

## 2024-01-31 DIAGNOSIS — Z9013 Acquired absence of bilateral breasts and nipples: Secondary | ICD-10-CM | POA: Insufficient documentation

## 2024-01-31 NOTE — Telephone Encounter (Signed)
Attempted to call [place] pain [facility] to check on the status of referral for pt. Received voicemail twice. Lvm to return call back for follow-up on referral.
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
## 2024-02-01 DIAGNOSIS — I5020 Unspecified systolic (congestive) heart failure: Secondary | ICD-10-CM | POA: Diagnosis not present

## 2024-02-01 NOTE — Assessment & Plan Note (Signed)
[name] is a 63 year old woman with history of stage IIIa triple positive breast cancer status post bilateral mastectomies followed by chemotherapy and maintenance Herceptin along with adjuvant radiation and tamoxifen which she took for 10 years beginning August 2013.  [name] has no clinical signs of breast cancer recurrence.  She continues on observation alone.  [name]'s chronic pain is a concern.  She met with Dr. Al Pimple and Dr. Al Pimple and I both conveyed to [name] that we do not have experience in managing methadone long-term and that her situation is complex since she has screened positive for cocaine.  I gave her information for [facility] to [facility] which is a self-referral location.  Dr. Al Pimple conveyed to [name] that she would reach out to palliative care and see if they might reconsider giving her another chance.  I let [name] know that we would call her tomorrow to inform her about what we hear from palliative care.

## 2024-02-02 DIAGNOSIS — I5020 Unspecified systolic (congestive) heart failure: Secondary | ICD-10-CM | POA: Diagnosis not present

## 2024-02-03 ENCOUNTER — Telehealth: Payer: Self-pay | Admitting: *Deleted

## 2024-02-03 DIAGNOSIS — I5020 Unspecified systolic (congestive) heart failure: Secondary | ICD-10-CM | POA: Diagnosis not present

## 2024-02-03 NOTE — Telephone Encounter (Signed)
This RN received VM ( note message also left on 2/12) - stating : I saw Mardella Layman and Dr Al Pimple and was told they were going to talk with Lowella Bandy and give me a call"  "I haven't received a call"  "This pain is getting really bad"  Shraddha left return call number as [phone]

## 2024-02-03 NOTE — Telephone Encounter (Signed)
Jaklyn left a VM on RN line at 156pm today stating she called the [facility] " and that is not the place for me " " You have to come in every day and get your methadone and they treat people who do not treat for chronic pain so I need to go somewhere else "

## 2024-02-03 NOTE — Telephone Encounter (Signed)
I do not have any other recommendations.  Please recommend she f/u with palliative care for their recommendations, or with her PCP.  Thanks, Mardella Layman

## 2024-02-03 NOTE — Telephone Encounter (Signed)
Called an informed patient she would need to go to [facility] for Methadone and chronic pain management.  She verbalized understanding.    Lillard Anes, NP 02/03/24 12:07 PM Medical Oncology and Hematology [facility] [address] [place], [place] [address] Tel. [phone]    Fax. [phone]

## 2024-02-04 DIAGNOSIS — I5020 Unspecified systolic (congestive) heart failure: Secondary | ICD-10-CM | POA: Diagnosis not present

## 2024-02-05 DIAGNOSIS — I5020 Unspecified systolic (congestive) heart failure: Secondary | ICD-10-CM | POA: Diagnosis not present

## 2024-02-06 ENCOUNTER — Telehealth: Payer: Self-pay | Admitting: *Deleted

## 2024-02-06 DIAGNOSIS — I5020 Unspecified systolic (congestive) heart failure: Secondary | ICD-10-CM | POA: Diagnosis not present

## 2024-02-06 NOTE — Telephone Encounter (Signed)
Pt left message per follow up from prior call today ( see prior phone note entered ) stating she has contacted [facility] and has an appt scheduled for 3/11 at 415pm "with the same doctor and I am fine with that"  Her request presently is for something for the pain that she is incurring.  This note will be forwarded to provider for review of pt's request.

## 2024-02-06 NOTE — Telephone Encounter (Signed)
Pt left a VM today (note she left a message late in day on Friday that has not been returned) stating "please have Mardella Layman give me something for this pain" " Even if you have to send me back to [facility] and get the suboxone I will do that - but if she could just give me something now - percocet, vicodin ..." " Please do not ignore me"  This RN called her back and informed her I am not ignoring her. Pt stated concern that "since [facility] left my pain medication got messed up - I need something"   This RN informed her Mardella Layman does not have other resources for pain control as discussed with per call on Friday.  This RN discussed pt's request for pain medication and that request can be asked but also that she should proceed to call [facility] and proceed back to them asap for management.  Pt asked for phone number with this RN giving it to her - she stated "I'll call tomorrow because they are probably closed today"  This RN informed her pt website it states the office is open and she should call - once appt is scheduled to call this office back with appt time to this RN.

## 2024-02-07 DIAGNOSIS — I5020 Unspecified systolic (congestive) heart failure: Secondary | ICD-10-CM | POA: Diagnosis not present

## 2024-02-08 DIAGNOSIS — I5020 Unspecified systolic (congestive) heart failure: Secondary | ICD-10-CM | POA: Diagnosis not present

## 2024-02-09 DIAGNOSIS — I5020 Unspecified systolic (congestive) heart failure: Secondary | ICD-10-CM | POA: Diagnosis not present

## 2024-02-09 NOTE — Telephone Encounter (Signed)
Dawayne Cirri,   [phone] perspective makes sense, and I like the way she worded this.  Is there a way for nursing to align with this messaging (whether it be placed in the blue section of her chart, or something of the sort) so we can all be giving her the same information and providing a united front if/when she calls back?       Thanks,   Mardella Layman

## 2024-02-10 DIAGNOSIS — I5020 Unspecified systolic (congestive) heart failure: Secondary | ICD-10-CM | POA: Diagnosis not present

## 2024-02-11 DIAGNOSIS — I5020 Unspecified systolic (congestive) heart failure: Secondary | ICD-10-CM | POA: Diagnosis not present

## 2024-02-12 DIAGNOSIS — I5020 Unspecified systolic (congestive) heart failure: Secondary | ICD-10-CM | POA: Diagnosis not present

## 2024-02-13 ENCOUNTER — Other Ambulatory Visit (HOSPITAL_COMMUNITY): Payer: Self-pay

## 2024-02-13 ENCOUNTER — Other Ambulatory Visit: Payer: Self-pay | Admitting: Student

## 2024-02-13 DIAGNOSIS — I10 Essential (primary) hypertension: Secondary | ICD-10-CM

## 2024-02-13 DIAGNOSIS — I5020 Unspecified systolic (congestive) heart failure: Secondary | ICD-10-CM | POA: Diagnosis not present

## 2024-02-13 DIAGNOSIS — M792 Neuralgia and neuritis, unspecified: Secondary | ICD-10-CM

## 2024-02-13 DIAGNOSIS — Z853 Personal history of malignant neoplasm of breast: Secondary | ICD-10-CM

## 2024-02-13 DIAGNOSIS — G8929 Other chronic pain: Secondary | ICD-10-CM

## 2024-02-14 ENCOUNTER — Other Ambulatory Visit (HOSPITAL_COMMUNITY): Payer: Self-pay

## 2024-02-14 DIAGNOSIS — I5020 Unspecified systolic (congestive) heart failure: Secondary | ICD-10-CM | POA: Diagnosis not present

## 2024-02-14 MED ORDER — FUROSEMIDE 20 MG PO TABS
40.0000 mg | ORAL_TABLET | Freq: Every day | ORAL | 0 refills | Status: DC
  Filled 2024-02-14 – 2024-02-20 (×2): qty 60, 30d supply, fill #0

## 2024-02-14 MED ORDER — EMPAGLIFLOZIN 10 MG PO TABS
10.0000 mg | ORAL_TABLET | Freq: Every day | ORAL | 1 refills | Status: DC
  Filled 2024-02-14 – 2024-02-20 (×2): qty 30, 30d supply, fill #0
  Filled 2024-03-26: qty 30, 30d supply, fill #1

## 2024-02-14 MED ORDER — SPIRONOLACTONE 25 MG PO TABS
25.0000 mg | ORAL_TABLET | Freq: Every day | ORAL | 0 refills | Status: DC
  Filled 2024-02-14 – 2024-02-20 (×2): qty 30, 30d supply, fill #0

## 2024-02-14 MED ORDER — ISOSORBIDE MONONITRATE ER 60 MG PO TB24
60.0000 mg | ORAL_TABLET | Freq: Every day | ORAL | 3 refills | Status: AC
  Filled 2024-02-14 – 2024-02-20 (×2): qty 90, 90d supply, fill #0
  Filled 2024-04-30: qty 90, 90d supply, fill #1
  Filled 2024-07-31: qty 90, 90d supply, fill #2
  Filled 2024-11-07: qty 90, 90d supply, fill #3

## 2024-02-15 DIAGNOSIS — I5020 Unspecified systolic (congestive) heart failure: Secondary | ICD-10-CM | POA: Diagnosis not present

## 2024-02-16 DIAGNOSIS — I5020 Unspecified systolic (congestive) heart failure: Secondary | ICD-10-CM | POA: Diagnosis not present

## 2024-02-17 DIAGNOSIS — I5020 Unspecified systolic (congestive) heart failure: Secondary | ICD-10-CM | POA: Diagnosis not present

## 2024-02-18 DIAGNOSIS — I5020 Unspecified systolic (congestive) heart failure: Secondary | ICD-10-CM | POA: Diagnosis not present

## 2024-02-19 DIAGNOSIS — I5020 Unspecified systolic (congestive) heart failure: Secondary | ICD-10-CM | POA: Diagnosis not present

## 2024-02-20 ENCOUNTER — Other Ambulatory Visit (HOSPITAL_COMMUNITY): Payer: Self-pay

## 2024-02-20 ENCOUNTER — Other Ambulatory Visit: Payer: Self-pay

## 2024-02-20 ENCOUNTER — Ambulatory Visit: Payer: Medicaid Other

## 2024-02-20 DIAGNOSIS — I5020 Unspecified systolic (congestive) heart failure: Secondary | ICD-10-CM | POA: Diagnosis not present

## 2024-02-21 ENCOUNTER — Other Ambulatory Visit (HOSPITAL_COMMUNITY): Payer: Self-pay

## 2024-02-21 DIAGNOSIS — I5020 Unspecified systolic (congestive) heart failure: Secondary | ICD-10-CM | POA: Diagnosis not present

## 2024-02-22 DIAGNOSIS — I5020 Unspecified systolic (congestive) heart failure: Secondary | ICD-10-CM | POA: Diagnosis not present

## 2024-02-23 DIAGNOSIS — I5020 Unspecified systolic (congestive) heart failure: Secondary | ICD-10-CM | POA: Diagnosis not present

## 2024-02-24 DIAGNOSIS — I5020 Unspecified systolic (congestive) heart failure: Secondary | ICD-10-CM | POA: Diagnosis not present

## 2024-02-25 DIAGNOSIS — I5020 Unspecified systolic (congestive) heart failure: Secondary | ICD-10-CM | POA: Diagnosis not present

## 2024-02-26 DIAGNOSIS — I5020 Unspecified systolic (congestive) heart failure: Secondary | ICD-10-CM | POA: Diagnosis not present

## 2024-02-27 ENCOUNTER — Telehealth: Payer: Self-pay

## 2024-02-27 ENCOUNTER — Ambulatory Visit: Payer: Medicaid Other | Attending: Adult Health

## 2024-02-27 DIAGNOSIS — M79602 Pain in left arm: Secondary | ICD-10-CM | POA: Insufficient documentation

## 2024-02-27 DIAGNOSIS — C50412 Malignant neoplasm of upper-outer quadrant of left female breast: Secondary | ICD-10-CM | POA: Insufficient documentation

## 2024-02-27 DIAGNOSIS — I5020 Unspecified systolic (congestive) heart failure: Secondary | ICD-10-CM | POA: Diagnosis not present

## 2024-02-27 DIAGNOSIS — Z483 Aftercare following surgery for neoplasm: Secondary | ICD-10-CM | POA: Insufficient documentation

## 2024-02-27 DIAGNOSIS — I890 Lymphedema, not elsewhere classified: Secondary | ICD-10-CM | POA: Insufficient documentation

## 2024-02-27 NOTE — Telephone Encounter (Signed)
 Pt had not arrived for her 2:00 appt so called and spoke with pt. She reports she slipped getting out of the tub and her knee was hurting due to that, so she didn't come to her appt today. Reminded pt of her last scheduled visit in 1 week on 3/17 at 2:00. Pt verbalized understanding.

## 2024-02-28 ENCOUNTER — Other Ambulatory Visit (HOSPITAL_COMMUNITY): Payer: Self-pay

## 2024-02-28 ENCOUNTER — Other Ambulatory Visit: Payer: Self-pay | Admitting: Student

## 2024-02-28 DIAGNOSIS — Z79899 Other long term (current) drug therapy: Secondary | ICD-10-CM | POA: Diagnosis not present

## 2024-02-28 DIAGNOSIS — I5020 Unspecified systolic (congestive) heart failure: Secondary | ICD-10-CM | POA: Diagnosis not present

## 2024-02-28 MED ORDER — BUPRENORPHINE HCL-NALOXONE HCL 4-1 MG SL FILM
ORAL_FILM | SUBLINGUAL | 0 refills | Status: AC
  Filled 2024-02-28: qty 60, 30d supply, fill #0

## 2024-02-29 ENCOUNTER — Other Ambulatory Visit (HOSPITAL_COMMUNITY): Payer: Self-pay

## 2024-02-29 DIAGNOSIS — I5020 Unspecified systolic (congestive) heart failure: Secondary | ICD-10-CM | POA: Diagnosis not present

## 2024-03-01 ENCOUNTER — Other Ambulatory Visit (HOSPITAL_COMMUNITY): Payer: Self-pay

## 2024-03-01 DIAGNOSIS — I5020 Unspecified systolic (congestive) heart failure: Secondary | ICD-10-CM | POA: Diagnosis not present

## 2024-03-02 ENCOUNTER — Other Ambulatory Visit: Payer: Self-pay | Admitting: Student

## 2024-03-02 ENCOUNTER — Other Ambulatory Visit (HOSPITAL_COMMUNITY): Payer: Self-pay

## 2024-03-02 DIAGNOSIS — I5020 Unspecified systolic (congestive) heart failure: Secondary | ICD-10-CM | POA: Diagnosis not present

## 2024-03-03 DIAGNOSIS — I5020 Unspecified systolic (congestive) heart failure: Secondary | ICD-10-CM | POA: Diagnosis not present

## 2024-03-04 DIAGNOSIS — I5020 Unspecified systolic (congestive) heart failure: Secondary | ICD-10-CM | POA: Diagnosis not present

## 2024-03-05 ENCOUNTER — Encounter: Payer: Self-pay | Admitting: Rehabilitation

## 2024-03-05 ENCOUNTER — Ambulatory Visit: Payer: Medicaid Other | Admitting: Rehabilitation

## 2024-03-05 DIAGNOSIS — I5020 Unspecified systolic (congestive) heart failure: Secondary | ICD-10-CM | POA: Diagnosis not present

## 2024-03-05 DIAGNOSIS — C50412 Malignant neoplasm of upper-outer quadrant of left female breast: Secondary | ICD-10-CM | POA: Diagnosis present

## 2024-03-05 DIAGNOSIS — M79602 Pain in left arm: Secondary | ICD-10-CM | POA: Diagnosis present

## 2024-03-05 DIAGNOSIS — I890 Lymphedema, not elsewhere classified: Secondary | ICD-10-CM | POA: Diagnosis present

## 2024-03-05 DIAGNOSIS — Z483 Aftercare following surgery for neoplasm: Secondary | ICD-10-CM | POA: Diagnosis present
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
## 2024-03-05 NOTE — Telephone Encounter (Signed)
 Patient LVM regarding refill request.   Does patient need appointment prior to refill or is rx no longer indicated?   Veronda Prude, RN

## 2024-03-06 DIAGNOSIS — I5020 Unspecified systolic (congestive) heart failure: Secondary | ICD-10-CM | POA: Diagnosis not present

## 2024-03-07 DIAGNOSIS — I5020 Unspecified systolic (congestive) heart failure: Secondary | ICD-10-CM | POA: Diagnosis not present

## 2024-03-08 DIAGNOSIS — I5020 Unspecified systolic (congestive) heart failure: Secondary | ICD-10-CM | POA: Diagnosis not present

## 2024-03-09 DIAGNOSIS — I5020 Unspecified systolic (congestive) heart failure: Secondary | ICD-10-CM | POA: Diagnosis not present

## 2024-03-10 DIAGNOSIS — I5020 Unspecified systolic (congestive) heart failure: Secondary | ICD-10-CM | POA: Diagnosis not present

## 2024-03-11 DIAGNOSIS — I5020 Unspecified systolic (congestive) heart failure: Secondary | ICD-10-CM | POA: Diagnosis not present

## 2024-03-12 DIAGNOSIS — I5020 Unspecified systolic (congestive) heart failure: Secondary | ICD-10-CM | POA: Diagnosis not present

## 2024-03-13 DIAGNOSIS — I5020 Unspecified systolic (congestive) heart failure: Secondary | ICD-10-CM | POA: Diagnosis not present

## 2024-03-14 ENCOUNTER — Ambulatory Visit

## 2024-03-14 DIAGNOSIS — I5020 Unspecified systolic (congestive) heart failure: Secondary | ICD-10-CM | POA: Diagnosis not present

## 2024-03-15 DIAGNOSIS — I5020 Unspecified systolic (congestive) heart failure: Secondary | ICD-10-CM | POA: Diagnosis not present

## 2024-03-16 DIAGNOSIS — I5020 Unspecified systolic (congestive) heart failure: Secondary | ICD-10-CM | POA: Diagnosis not present

## 2024-03-17 DIAGNOSIS — I5020 Unspecified systolic (congestive) heart failure: Secondary | ICD-10-CM | POA: Diagnosis not present

## 2024-03-18 DIAGNOSIS — I5020 Unspecified systolic (congestive) heart failure: Secondary | ICD-10-CM | POA: Diagnosis not present

## 2024-03-19 DIAGNOSIS — I5020 Unspecified systolic (congestive) heart failure: Secondary | ICD-10-CM | POA: Diagnosis not present

## 2024-03-20 DIAGNOSIS — I5020 Unspecified systolic (congestive) heart failure: Secondary | ICD-10-CM | POA: Diagnosis not present

## 2024-03-21 ENCOUNTER — Ambulatory Visit: Attending: Adult Health

## 2024-03-21 DIAGNOSIS — I5020 Unspecified systolic (congestive) heart failure: Secondary | ICD-10-CM | POA: Diagnosis not present

## 2024-03-21 DIAGNOSIS — I890 Lymphedema, not elsewhere classified: Secondary | ICD-10-CM | POA: Insufficient documentation

## 2024-03-21 DIAGNOSIS — M25512 Pain in left shoulder: Secondary | ICD-10-CM | POA: Insufficient documentation

## 2024-03-21 DIAGNOSIS — C50412 Malignant neoplasm of upper-outer quadrant of left female breast: Secondary | ICD-10-CM | POA: Insufficient documentation

## 2024-03-21 DIAGNOSIS — Z483 Aftercare following surgery for neoplasm: Secondary | ICD-10-CM | POA: Insufficient documentation

## 2024-03-21 DIAGNOSIS — M25612 Stiffness of left shoulder, not elsewhere classified: Secondary | ICD-10-CM | POA: Insufficient documentation

## 2024-03-21 DIAGNOSIS — C50912 Malignant neoplasm of unspecified site of left female breast: Secondary | ICD-10-CM | POA: Diagnosis not present

## 2024-03-21 DIAGNOSIS — G8929 Other chronic pain: Secondary | ICD-10-CM | POA: Insufficient documentation

## 2024-03-21 DIAGNOSIS — Z9011 Acquired absence of right breast and nipple: Secondary | ICD-10-CM | POA: Diagnosis not present

## 2024-03-22 DIAGNOSIS — I5020 Unspecified systolic (congestive) heart failure: Secondary | ICD-10-CM | POA: Diagnosis not present

## 2024-03-23 DIAGNOSIS — I5020 Unspecified systolic (congestive) heart failure: Secondary | ICD-10-CM | POA: Diagnosis not present

## 2024-03-24 ENCOUNTER — Other Ambulatory Visit (HOSPITAL_COMMUNITY): Payer: Self-pay

## 2024-03-24 DIAGNOSIS — I5020 Unspecified systolic (congestive) heart failure: Secondary | ICD-10-CM | POA: Diagnosis not present

## 2024-03-25 DIAGNOSIS — I5020 Unspecified systolic (congestive) heart failure: Secondary | ICD-10-CM | POA: Diagnosis not present

## 2024-03-26 ENCOUNTER — Other Ambulatory Visit: Payer: Self-pay | Admitting: Student

## 2024-03-26 ENCOUNTER — Other Ambulatory Visit: Payer: Self-pay | Admitting: Gynecologic Oncology

## 2024-03-26 ENCOUNTER — Other Ambulatory Visit (HOSPITAL_BASED_OUTPATIENT_CLINIC_OR_DEPARTMENT_OTHER): Payer: Self-pay

## 2024-03-26 ENCOUNTER — Other Ambulatory Visit (HOSPITAL_COMMUNITY): Payer: Self-pay

## 2024-03-26 DIAGNOSIS — I5020 Unspecified systolic (congestive) heart failure: Secondary | ICD-10-CM | POA: Diagnosis not present

## 2024-03-26 DIAGNOSIS — N8502 Endometrial intraepithelial neoplasia [EIN]: Secondary | ICD-10-CM

## 2024-03-26 NOTE — Telephone Encounter (Signed)
 Hi Marijean Niemann - could you call this patient and let her know that she needs to reach out to her gynecologist for this prescription now? Thank you

## 2024-03-27 ENCOUNTER — Other Ambulatory Visit (HOSPITAL_COMMUNITY): Payer: Self-pay

## 2024-03-27 ENCOUNTER — Other Ambulatory Visit: Payer: Self-pay

## 2024-03-27 DIAGNOSIS — I5020 Unspecified systolic (congestive) heart failure: Secondary | ICD-10-CM | POA: Diagnosis not present

## 2024-03-27 MED ORDER — EMPAGLIFLOZIN 10 MG PO TABS: 10.0000 mg | ORAL_TABLET | Freq: Every day | ORAL | 1 refills | Status: DC

## 2024-03-27 MED ORDER — LOSARTAN POTASSIUM 50 MG PO TABS
50.0000 mg | ORAL_TABLET | Freq: Every day | ORAL | 5 refills | Status: DC
  Filled 2024-04-30: qty 30, 30d supply, fill #0
  Filled 2024-06-01: qty 30, 30d supply, fill #1
  Filled 2024-06-29: qty 30, 30d supply, fill #2
  Filled 2024-07-31: qty 30, 30d supply, fill #3
  Filled 2024-09-17: qty 30, 30d supply, fill #4
  Filled 2024-10-15: qty 30, 30d supply, fill #5

## 2024-03-27 MED FILL — Furosemide Tab 20 MG: ORAL | 30 days supply | Qty: 60 | Fill #0 | Status: CN

## 2024-03-27 MED FILL — Potassium Chloride Microencapsulated Crys ER Tab 20 mEq: ORAL | 90 days supply | Qty: 270 | Fill #0 | Status: AC

## 2024-03-27 MED FILL — Ezetimibe Tab 10 MG: ORAL | 30 days supply | Qty: 30 | Fill #0 | Status: AC

## 2024-03-27 MED FILL — Furosemide Tab 20 MG: ORAL | 30 days supply | Qty: 60 | Fill #0 | Status: AC

## 2024-03-27 MED FILL — Spironolactone Tab 25 MG: ORAL | 30 days supply | Qty: 30 | Fill #0 | Status: AC

## 2024-03-27 MED FILL — Spironolactone Tab 25 MG: ORAL | 30 days supply | Qty: 30 | Fill #0 | Status: CN

## 2024-03-27 MED FILL — Potassium Chloride Microencapsulated Crys ER Tab 20 mEq: ORAL | 90 days supply | Qty: 270 | Fill #0 | Status: CN

## 2024-03-27 MED FILL — Ezetimibe Tab 10 MG: ORAL | 30 days supply | Qty: 30 | Fill #0 | Status: CN

## 2024-03-28 ENCOUNTER — Other Ambulatory Visit: Payer: Self-pay

## 2024-03-28 ENCOUNTER — Other Ambulatory Visit (HOSPITAL_COMMUNITY): Payer: Self-pay

## 2024-03-28 ENCOUNTER — Ambulatory Visit

## 2024-03-28 DIAGNOSIS — I5020 Unspecified systolic (congestive) heart failure: Secondary | ICD-10-CM | POA: Diagnosis not present

## 2024-03-29 DIAGNOSIS — I5020 Unspecified systolic (congestive) heart failure: Secondary | ICD-10-CM | POA: Diagnosis not present

## 2024-03-30 ENCOUNTER — Other Ambulatory Visit (HOSPITAL_COMMUNITY): Payer: Self-pay

## 2024-03-30 DIAGNOSIS — I5020 Unspecified systolic (congestive) heart failure: Secondary | ICD-10-CM | POA: Diagnosis not present

## 2024-03-31 DIAGNOSIS — I5020 Unspecified systolic (congestive) heart failure: Secondary | ICD-10-CM | POA: Diagnosis not present

## 2024-04-01 DIAGNOSIS — I5020 Unspecified systolic (congestive) heart failure: Secondary | ICD-10-CM | POA: Diagnosis not present

## 2024-04-02 ENCOUNTER — Encounter

## 2024-04-02 ENCOUNTER — Other Ambulatory Visit (HOSPITAL_COMMUNITY): Payer: Self-pay

## 2024-04-02 DIAGNOSIS — I5020 Unspecified systolic (congestive) heart failure: Secondary | ICD-10-CM | POA: Diagnosis not present

## 2024-04-02 DIAGNOSIS — Z79899 Other long term (current) drug therapy: Secondary | ICD-10-CM | POA: Diagnosis not present

## 2024-04-02 MED ORDER — BUPRENORPHINE HCL-NALOXONE HCL 4-1 MG SL FILM
2.0000 mg | ORAL_FILM | Freq: Four times a day (QID) | SUBLINGUAL | 0 refills | Status: AC | PRN
  Filled 2024-04-02: qty 120, 30d supply, fill #0

## 2024-04-03 ENCOUNTER — Other Ambulatory Visit (HOSPITAL_COMMUNITY): Payer: Self-pay

## 2024-04-03 ENCOUNTER — Other Ambulatory Visit: Payer: Self-pay

## 2024-04-03 DIAGNOSIS — I5020 Unspecified systolic (congestive) heart failure: Secondary | ICD-10-CM | POA: Diagnosis not present

## 2024-04-04 ENCOUNTER — Other Ambulatory Visit (HOSPITAL_COMMUNITY): Payer: Self-pay

## 2024-04-04 ENCOUNTER — Ambulatory Visit: Admitting: Rehabilitation

## 2024-04-04 DIAGNOSIS — Z79899 Other long term (current) drug therapy: Secondary | ICD-10-CM | POA: Diagnosis not present

## 2024-04-04 DIAGNOSIS — I5020 Unspecified systolic (congestive) heart failure: Secondary | ICD-10-CM | POA: Diagnosis not present

## 2024-04-05 DIAGNOSIS — I5020 Unspecified systolic (congestive) heart failure: Secondary | ICD-10-CM | POA: Diagnosis not present

## 2024-04-06 ENCOUNTER — Telehealth: Payer: Self-pay

## 2024-04-06 DIAGNOSIS — C50412 Malignant neoplasm of upper-outer quadrant of left female breast: Secondary | ICD-10-CM

## 2024-04-06 DIAGNOSIS — I5020 Unspecified systolic (congestive) heart failure: Secondary | ICD-10-CM | POA: Diagnosis not present

## 2024-04-06 NOTE — Telephone Encounter (Signed)
 Pt called to request PT continuity for LUE lymphedema. Order/referral placed per MD verbal order. Pt aware and verbalized thanks.

## 2024-04-07 DIAGNOSIS — I5020 Unspecified systolic (congestive) heart failure: Secondary | ICD-10-CM | POA: Diagnosis not present

## 2024-04-08 DIAGNOSIS — I5020 Unspecified systolic (congestive) heart failure: Secondary | ICD-10-CM | POA: Diagnosis not present

## 2024-04-09 DIAGNOSIS — I5020 Unspecified systolic (congestive) heart failure: Secondary | ICD-10-CM | POA: Diagnosis not present

## 2024-04-10 DIAGNOSIS — I5020 Unspecified systolic (congestive) heart failure: Secondary | ICD-10-CM | POA: Diagnosis not present

## 2024-04-11 DIAGNOSIS — I5020 Unspecified systolic (congestive) heart failure: Secondary | ICD-10-CM | POA: Diagnosis not present

## 2024-04-12 DIAGNOSIS — I5020 Unspecified systolic (congestive) heart failure: Secondary | ICD-10-CM | POA: Diagnosis not present

## 2024-04-13 DIAGNOSIS — I5020 Unspecified systolic (congestive) heart failure: Secondary | ICD-10-CM | POA: Diagnosis not present

## 2024-04-14 DIAGNOSIS — I5020 Unspecified systolic (congestive) heart failure: Secondary | ICD-10-CM | POA: Diagnosis not present

## 2024-04-15 DIAGNOSIS — I5020 Unspecified systolic (congestive) heart failure: Secondary | ICD-10-CM | POA: Diagnosis not present
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
## 2024-04-16 ENCOUNTER — Ambulatory Visit

## 2024-04-16 ENCOUNTER — Other Ambulatory Visit: Payer: Self-pay

## 2024-04-16 DIAGNOSIS — I890 Lymphedema, not elsewhere classified: Secondary | ICD-10-CM

## 2024-04-16 DIAGNOSIS — M25612 Stiffness of left shoulder, not elsewhere classified: Secondary | ICD-10-CM

## 2024-04-16 DIAGNOSIS — G8929 Other chronic pain: Secondary | ICD-10-CM

## 2024-04-16 DIAGNOSIS — C50412 Malignant neoplasm of upper-outer quadrant of left female breast: Secondary | ICD-10-CM | POA: Diagnosis present

## 2024-04-16 DIAGNOSIS — I5020 Unspecified systolic (congestive) heart failure: Secondary | ICD-10-CM | POA: Diagnosis not present

## 2024-04-16 DIAGNOSIS — Z483 Aftercare following surgery for neoplasm: Secondary | ICD-10-CM | POA: Diagnosis present

## 2024-04-16 DIAGNOSIS — M25512 Pain in left shoulder: Secondary | ICD-10-CM | POA: Diagnosis present

## 2024-04-17 DIAGNOSIS — I5020 Unspecified systolic (congestive) heart failure: Secondary | ICD-10-CM | POA: Diagnosis not present

## 2024-04-18 DIAGNOSIS — I5020 Unspecified systolic (congestive) heart failure: Secondary | ICD-10-CM | POA: Diagnosis not present

## 2024-04-19 DIAGNOSIS — I5020 Unspecified systolic (congestive) heart failure: Secondary | ICD-10-CM | POA: Diagnosis not present

## 2024-04-20 DIAGNOSIS — I5020 Unspecified systolic (congestive) heart failure: Secondary | ICD-10-CM | POA: Diagnosis not present

## 2024-04-21 DIAGNOSIS — I5020 Unspecified systolic (congestive) heart failure: Secondary | ICD-10-CM | POA: Diagnosis not present

## 2024-04-22 DIAGNOSIS — I5020 Unspecified systolic (congestive) heart failure: Secondary | ICD-10-CM | POA: Diagnosis not present

## 2024-04-23 ENCOUNTER — Ambulatory Visit: Admitting: Podiatry

## 2024-04-23 DIAGNOSIS — I5020 Unspecified systolic (congestive) heart failure: Secondary | ICD-10-CM | POA: Diagnosis not present

## 2024-04-24 DIAGNOSIS — I5020 Unspecified systolic (congestive) heart failure: Secondary | ICD-10-CM | POA: Diagnosis not present

## 2024-04-25 DIAGNOSIS — I5020 Unspecified systolic (congestive) heart failure: Secondary | ICD-10-CM | POA: Diagnosis not present

## 2024-04-26 ENCOUNTER — Ambulatory Visit

## 2024-04-26 DIAGNOSIS — I5020 Unspecified systolic (congestive) heart failure: Secondary | ICD-10-CM | POA: Diagnosis not present

## 2024-04-27 DIAGNOSIS — I5020 Unspecified systolic (congestive) heart failure: Secondary | ICD-10-CM | POA: Diagnosis not present

## 2024-04-28 DIAGNOSIS — I5020 Unspecified systolic (congestive) heart failure: Secondary | ICD-10-CM | POA: Diagnosis not present

## 2024-04-29 DIAGNOSIS — I5020 Unspecified systolic (congestive) heart failure: Secondary | ICD-10-CM | POA: Diagnosis not present

## 2024-04-30 ENCOUNTER — Other Ambulatory Visit (HOSPITAL_COMMUNITY): Payer: Self-pay

## 2024-04-30 ENCOUNTER — Other Ambulatory Visit: Payer: Self-pay | Admitting: Student

## 2024-04-30 DIAGNOSIS — I5020 Unspecified systolic (congestive) heart failure: Secondary | ICD-10-CM | POA: Diagnosis not present

## 2024-04-30 MED FILL — Ezetimibe Tab 10 MG: ORAL | 30 days supply | Qty: 30 | Fill #1 | Status: AC

## 2024-05-01 ENCOUNTER — Other Ambulatory Visit (HOSPITAL_COMMUNITY): Payer: Self-pay

## 2024-05-01 DIAGNOSIS — I5020 Unspecified systolic (congestive) heart failure: Secondary | ICD-10-CM | POA: Diagnosis not present

## 2024-05-01 NOTE — Telephone Encounter (Signed)
 Patient returns call to nurse line regarding prescription refills.   Requesting as soon as possible.   Please advise.   Elsie Halo, RN

## 2024-05-02 ENCOUNTER — Other Ambulatory Visit (HOSPITAL_COMMUNITY): Payer: Self-pay

## 2024-05-02 ENCOUNTER — Other Ambulatory Visit: Payer: Self-pay

## 2024-05-02 DIAGNOSIS — I5020 Unspecified systolic (congestive) heart failure: Secondary | ICD-10-CM | POA: Diagnosis not present

## 2024-05-02 MED ORDER — FUROSEMIDE 20 MG PO TABS
40.0000 mg | ORAL_TABLET | Freq: Every day | ORAL | 0 refills | Status: DC
  Filled 2024-05-02: qty 60, 30d supply, fill #0

## 2024-05-02 MED ORDER — SPIRONOLACTONE 25 MG PO TABS
25.0000 mg | ORAL_TABLET | Freq: Every day | ORAL | 0 refills | Status: DC
  Filled 2024-05-02: qty 30, 30d supply, fill #0

## 2024-05-03 ENCOUNTER — Encounter: Payer: Self-pay | Admitting: Rehabilitation

## 2024-05-03 ENCOUNTER — Ambulatory Visit: Attending: Adult Health | Admitting: Rehabilitation

## 2024-05-03 DIAGNOSIS — C50412 Malignant neoplasm of upper-outer quadrant of left female breast: Secondary | ICD-10-CM | POA: Insufficient documentation

## 2024-05-03 DIAGNOSIS — I890 Lymphedema, not elsewhere classified: Secondary | ICD-10-CM | POA: Insufficient documentation

## 2024-05-03 DIAGNOSIS — M25612 Stiffness of left shoulder, not elsewhere classified: Secondary | ICD-10-CM | POA: Diagnosis present

## 2024-05-03 DIAGNOSIS — G8929 Other chronic pain: Secondary | ICD-10-CM | POA: Diagnosis present

## 2024-05-03 DIAGNOSIS — I5020 Unspecified systolic (congestive) heart failure: Secondary | ICD-10-CM | POA: Diagnosis not present

## 2024-05-03 DIAGNOSIS — M25512 Pain in left shoulder: Secondary | ICD-10-CM | POA: Insufficient documentation

## 2024-05-03 DIAGNOSIS — Z483 Aftercare following surgery for neoplasm: Secondary | ICD-10-CM | POA: Insufficient documentation

## 2024-05-03 DIAGNOSIS — M79602 Pain in left arm: Secondary | ICD-10-CM | POA: Insufficient documentation
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
## 2024-05-04 DIAGNOSIS — I5020 Unspecified systolic (congestive) heart failure: Secondary | ICD-10-CM | POA: Diagnosis not present

## 2024-05-05 DIAGNOSIS — I5020 Unspecified systolic (congestive) heart failure: Secondary | ICD-10-CM | POA: Diagnosis not present

## 2024-05-06 DIAGNOSIS — I5020 Unspecified systolic (congestive) heart failure: Secondary | ICD-10-CM | POA: Diagnosis not present

## 2024-05-07 DIAGNOSIS — I5020 Unspecified systolic (congestive) heart failure: Secondary | ICD-10-CM | POA: Diagnosis not present

## 2024-05-08 DIAGNOSIS — I5020 Unspecified systolic (congestive) heart failure: Secondary | ICD-10-CM | POA: Diagnosis not present

## 2024-05-09 DIAGNOSIS — I5020 Unspecified systolic (congestive) heart failure: Secondary | ICD-10-CM | POA: Diagnosis not present

## 2024-05-10 ENCOUNTER — Ambulatory Visit: Admitting: Physical Therapy

## 2024-05-10 ENCOUNTER — Encounter: Payer: Self-pay | Admitting: Physical Therapy

## 2024-05-10 DIAGNOSIS — G8929 Other chronic pain: Secondary | ICD-10-CM

## 2024-05-10 DIAGNOSIS — I890 Lymphedema, not elsewhere classified: Secondary | ICD-10-CM | POA: Diagnosis not present

## 2024-05-10 DIAGNOSIS — C50412 Malignant neoplasm of upper-outer quadrant of left female breast: Secondary | ICD-10-CM

## 2024-05-10 DIAGNOSIS — I5020 Unspecified systolic (congestive) heart failure: Secondary | ICD-10-CM | POA: Diagnosis not present

## 2024-05-10 DIAGNOSIS — Z483 Aftercare following surgery for neoplasm: Secondary | ICD-10-CM

## 2024-05-10 DIAGNOSIS — M25612 Stiffness of left shoulder, not elsewhere classified: Secondary | ICD-10-CM
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
## 2024-05-11 ENCOUNTER — Ambulatory Visit: Admitting: Student

## 2024-05-11 DIAGNOSIS — I5020 Unspecified systolic (congestive) heart failure: Secondary | ICD-10-CM | POA: Diagnosis not present

## 2024-05-12 DIAGNOSIS — I5020 Unspecified systolic (congestive) heart failure: Secondary | ICD-10-CM | POA: Diagnosis not present

## 2024-05-13 DIAGNOSIS — I5020 Unspecified systolic (congestive) heart failure: Secondary | ICD-10-CM | POA: Diagnosis not present

## 2024-05-14 DIAGNOSIS — I5020 Unspecified systolic (congestive) heart failure: Secondary | ICD-10-CM | POA: Diagnosis not present

## 2024-05-15 DIAGNOSIS — I5020 Unspecified systolic (congestive) heart failure: Secondary | ICD-10-CM | POA: Diagnosis not present

## 2024-05-16 ENCOUNTER — Ambulatory Visit

## 2024-05-16 DIAGNOSIS — I5020 Unspecified systolic (congestive) heart failure: Secondary | ICD-10-CM | POA: Diagnosis not present

## 2024-05-17 ENCOUNTER — Encounter: Admitting: Physical Therapy

## 2024-05-17 DIAGNOSIS — I5020 Unspecified systolic (congestive) heart failure: Secondary | ICD-10-CM | POA: Diagnosis not present

## 2024-05-18 DIAGNOSIS — I5020 Unspecified systolic (congestive) heart failure: Secondary | ICD-10-CM | POA: Diagnosis not present

## 2024-05-19 DIAGNOSIS — I5020 Unspecified systolic (congestive) heart failure: Secondary | ICD-10-CM | POA: Diagnosis not present

## 2024-05-20 DIAGNOSIS — I5020 Unspecified systolic (congestive) heart failure: Secondary | ICD-10-CM | POA: Diagnosis not present

## 2024-05-21 DIAGNOSIS — I5020 Unspecified systolic (congestive) heart failure: Secondary | ICD-10-CM | POA: Diagnosis not present

## 2024-05-22 ENCOUNTER — Ambulatory Visit: Admitting: Student

## 2024-05-22 DIAGNOSIS — I5020 Unspecified systolic (congestive) heart failure: Secondary | ICD-10-CM | POA: Diagnosis not present

## 2024-05-23 ENCOUNTER — Ambulatory Visit: Admitting: Podiatry

## 2024-05-23 DIAGNOSIS — I5020 Unspecified systolic (congestive) heart failure: Secondary | ICD-10-CM | POA: Diagnosis not present

## 2024-05-24 ENCOUNTER — Ambulatory Visit: Admitting: Physical Therapy

## 2024-05-24 DIAGNOSIS — I5020 Unspecified systolic (congestive) heart failure: Secondary | ICD-10-CM | POA: Diagnosis not present

## 2024-05-25 DIAGNOSIS — I5020 Unspecified systolic (congestive) heart failure: Secondary | ICD-10-CM | POA: Diagnosis not present

## 2024-05-26 DIAGNOSIS — I5020 Unspecified systolic (congestive) heart failure: Secondary | ICD-10-CM | POA: Diagnosis not present

## 2024-05-27 DIAGNOSIS — I5020 Unspecified systolic (congestive) heart failure: Secondary | ICD-10-CM | POA: Diagnosis not present

## 2024-05-28 DIAGNOSIS — I5020 Unspecified systolic (congestive) heart failure: Secondary | ICD-10-CM | POA: Diagnosis not present

## 2024-05-29 ENCOUNTER — Encounter: Payer: Self-pay | Admitting: *Deleted

## 2024-05-29 DIAGNOSIS — I5020 Unspecified systolic (congestive) heart failure: Secondary | ICD-10-CM | POA: Diagnosis not present

## 2024-05-30 DIAGNOSIS — I5020 Unspecified systolic (congestive) heart failure: Secondary | ICD-10-CM | POA: Diagnosis not present

## 2024-05-31 ENCOUNTER — Ambulatory Visit: Admitting: Rehabilitation

## 2024-05-31 DIAGNOSIS — I5020 Unspecified systolic (congestive) heart failure: Secondary | ICD-10-CM | POA: Diagnosis not present

## 2024-06-01 ENCOUNTER — Other Ambulatory Visit: Payer: Self-pay | Admitting: Student

## 2024-06-01 ENCOUNTER — Other Ambulatory Visit: Payer: Self-pay

## 2024-06-01 ENCOUNTER — Other Ambulatory Visit (HOSPITAL_COMMUNITY): Payer: Self-pay

## 2024-06-01 DIAGNOSIS — I5020 Unspecified systolic (congestive) heart failure: Secondary | ICD-10-CM | POA: Diagnosis not present

## 2024-06-01 MED ORDER — SPIRONOLACTONE 25 MG PO TABS
25.0000 mg | ORAL_TABLET | Freq: Every day | ORAL | 0 refills | Status: DC
  Filled 2024-06-01: qty 30, 30d supply, fill #0

## 2024-06-01 MED ORDER — FUROSEMIDE 20 MG PO TABS
40.0000 mg | ORAL_TABLET | Freq: Every day | ORAL | 0 refills | Status: DC
  Filled 2024-06-01: qty 60, 30d supply, fill #0

## 2024-06-01 MED ORDER — EZETIMIBE 10 MG PO TABS
10.0000 mg | ORAL_TABLET | Freq: Every day | ORAL | 1 refills | Status: DC
  Filled 2024-06-01: qty 30, 30d supply, fill #0
  Filled 2024-06-29: qty 30, 30d supply, fill #1

## 2024-06-02 DIAGNOSIS — I5020 Unspecified systolic (congestive) heart failure: Secondary | ICD-10-CM | POA: Diagnosis not present

## 2024-06-03 DIAGNOSIS — I5020 Unspecified systolic (congestive) heart failure: Secondary | ICD-10-CM | POA: Diagnosis not present

## 2024-06-04 DIAGNOSIS — I5020 Unspecified systolic (congestive) heart failure: Secondary | ICD-10-CM | POA: Diagnosis not present

## 2024-06-05 DIAGNOSIS — I5020 Unspecified systolic (congestive) heart failure: Secondary | ICD-10-CM | POA: Diagnosis not present

## 2024-06-06 ENCOUNTER — Other Ambulatory Visit: Payer: Self-pay | Admitting: *Deleted

## 2024-06-06 DIAGNOSIS — I5020 Unspecified systolic (congestive) heart failure: Secondary | ICD-10-CM | POA: Diagnosis not present

## 2024-06-06 DIAGNOSIS — I890 Lymphedema, not elsewhere classified: Secondary | ICD-10-CM

## 2024-06-06 DIAGNOSIS — C50412 Malignant neoplasm of upper-outer quadrant of left female breast: Secondary | ICD-10-CM

## 2024-06-06 NOTE — Progress Notes (Signed)
 Pt left message for referral for PT. Referral placed.

## 2024-06-07 ENCOUNTER — Ambulatory Visit (INDEPENDENT_AMBULATORY_CARE_PROVIDER_SITE_OTHER): Admitting: Student

## 2024-06-07 ENCOUNTER — Other Ambulatory Visit: Payer: Self-pay

## 2024-06-07 ENCOUNTER — Other Ambulatory Visit (HOSPITAL_COMMUNITY): Payer: Self-pay

## 2024-06-07 VITALS — BP 125/80 | HR 78 | Temp 98.0°F | Wt 208.8 lb

## 2024-06-07 DIAGNOSIS — I5020 Unspecified systolic (congestive) heart failure: Secondary | ICD-10-CM | POA: Diagnosis not present

## 2024-06-07 DIAGNOSIS — L209 Atopic dermatitis, unspecified: Secondary | ICD-10-CM | POA: Diagnosis present

## 2024-06-07 DIAGNOSIS — L259 Unspecified contact dermatitis, unspecified cause: Secondary | ICD-10-CM | POA: Diagnosis not present

## 2024-06-07 MED ORDER — HYDROCORTISONE 2.5 % EX OINT
TOPICAL_OINTMENT | Freq: Two times a day (BID) | CUTANEOUS | 0 refills | Status: DC
  Filled 2024-06-07: qty 28.35, 10d supply, fill #0

## 2024-06-07 NOTE — Progress Notes (Signed)
    SUBJECTIVE:   CHIEF COMPLAINT / HPI:   Rash -On abdomen, started 3-4 days ago -Very itchy -Has been putting Dove deodarant on the area -Using a healing ointment on the rash -New detergent- cousin washed her shirt with their detergent -Using Dial antibacterial soap feels better -No fever, new medications  PERTINENT  PMH / PSH:   OBJECTIVE:   BP 125/80   Pulse 78   Temp 98 F (36.7 C)   Wt 208 lb 12.8 oz (94.7 kg)   LMP 08/07/2011   SpO2 98%   BMI 34.75 kg/m   General: Pleasant, generally well appearing Resp: Normal effort on room air Skin: Raised urticarial rash with wheals across abdomen, no drainage or ulceration. No mucosal involvement.   ASSESSMENT/PLAN:   Contact dermatitis Rashes likely contact dermatitis given new use of deodorant detergent Treat with topical hydrocortisone  2.5% twice daily for 7 days rash Reassured against cellulitis or other infectious process, no red flag signs or symptoms such as mucosal involvement     Vallorie Gayer, DO [facility]

## 2024-06-07 NOTE — Assessment & Plan Note (Signed)
 Rashes likely contact dermatitis given new use of deodorant detergent Treat with topical hydrocortisone  2.5% twice daily for 7 days rash Reassured against cellulitis or other infectious process, no red flag signs or symptoms such as mucosal involvement

## 2024-06-07 NOTE — Patient Instructions (Signed)
 It was great seeing you today.  Rash -Likely from contact irritation -Use steroid cream twice per day for 7 to 10 days. Sent to your pharmacy -Avoid new detergents, deodorants, scented products (perfumes, etc)   Dr. Vallorie Gayer [facility] [phone]

## 2024-06-08 ENCOUNTER — Other Ambulatory Visit (HOSPITAL_COMMUNITY): Payer: Self-pay

## 2024-06-08 DIAGNOSIS — I5020 Unspecified systolic (congestive) heart failure: Secondary | ICD-10-CM | POA: Diagnosis not present

## 2024-06-09 DIAGNOSIS — I5020 Unspecified systolic (congestive) heart failure: Secondary | ICD-10-CM | POA: Diagnosis not present

## 2024-06-10 DIAGNOSIS — I5020 Unspecified systolic (congestive) heart failure: Secondary | ICD-10-CM | POA: Diagnosis not present

## 2024-06-11 DIAGNOSIS — I5020 Unspecified systolic (congestive) heart failure: Secondary | ICD-10-CM | POA: Diagnosis not present

## 2024-06-12 DIAGNOSIS — I5020 Unspecified systolic (congestive) heart failure: Secondary | ICD-10-CM | POA: Diagnosis not present

## 2024-06-13 DIAGNOSIS — I5020 Unspecified systolic (congestive) heart failure: Secondary | ICD-10-CM | POA: Diagnosis not present

## 2024-06-14 ENCOUNTER — Ambulatory Visit: Admitting: Rehabilitation

## 2024-06-14 DIAGNOSIS — I5020 Unspecified systolic (congestive) heart failure: Secondary | ICD-10-CM | POA: Diagnosis not present

## 2024-06-15 DIAGNOSIS — I5020 Unspecified systolic (congestive) heart failure: Secondary | ICD-10-CM | POA: Diagnosis not present

## 2024-06-16 DIAGNOSIS — I5020 Unspecified systolic (congestive) heart failure: Secondary | ICD-10-CM | POA: Diagnosis not present

## 2024-06-17 DIAGNOSIS — I5020 Unspecified systolic (congestive) heart failure: Secondary | ICD-10-CM | POA: Diagnosis not present

## 2024-06-18 DIAGNOSIS — I5020 Unspecified systolic (congestive) heart failure: Secondary | ICD-10-CM | POA: Diagnosis not present

## 2024-06-19 DIAGNOSIS — I5020 Unspecified systolic (congestive) heart failure: Secondary | ICD-10-CM | POA: Diagnosis not present

## 2024-06-20 DIAGNOSIS — I5020 Unspecified systolic (congestive) heart failure: Secondary | ICD-10-CM | POA: Diagnosis not present

## 2024-06-21 DIAGNOSIS — I5020 Unspecified systolic (congestive) heart failure: Secondary | ICD-10-CM | POA: Diagnosis not present

## 2024-06-22 DIAGNOSIS — I5020 Unspecified systolic (congestive) heart failure: Secondary | ICD-10-CM | POA: Diagnosis not present

## 2024-06-23 DIAGNOSIS — I5020 Unspecified systolic (congestive) heart failure: Secondary | ICD-10-CM | POA: Diagnosis not present

## 2024-06-24 DIAGNOSIS — I5020 Unspecified systolic (congestive) heart failure: Secondary | ICD-10-CM | POA: Diagnosis not present

## 2024-06-25 ENCOUNTER — Encounter: Payer: Self-pay | Admitting: Rehabilitation

## 2024-06-25 ENCOUNTER — Ambulatory Visit: Attending: Hematology and Oncology | Admitting: Rehabilitation

## 2024-06-25 ENCOUNTER — Other Ambulatory Visit: Payer: Self-pay

## 2024-06-25 DIAGNOSIS — M25612 Stiffness of left shoulder, not elsewhere classified: Secondary | ICD-10-CM | POA: Insufficient documentation

## 2024-06-25 DIAGNOSIS — I890 Lymphedema, not elsewhere classified: Secondary | ICD-10-CM | POA: Insufficient documentation

## 2024-06-25 DIAGNOSIS — M25512 Pain in left shoulder: Secondary | ICD-10-CM | POA: Diagnosis present

## 2024-06-25 DIAGNOSIS — I5020 Unspecified systolic (congestive) heart failure: Secondary | ICD-10-CM | POA: Diagnosis not present

## 2024-06-25 DIAGNOSIS — G8929 Other chronic pain: Secondary | ICD-10-CM | POA: Diagnosis present

## 2024-06-25 DIAGNOSIS — Z483 Aftercare following surgery for neoplasm: Secondary | ICD-10-CM | POA: Diagnosis present

## 2024-06-25 DIAGNOSIS — M79602 Pain in left arm: Secondary | ICD-10-CM | POA: Diagnosis present

## 2024-06-25 DIAGNOSIS — C50412 Malignant neoplasm of upper-outer quadrant of left female breast: Secondary | ICD-10-CM | POA: Insufficient documentation
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
## 2024-06-26 DIAGNOSIS — I5020 Unspecified systolic (congestive) heart failure: Secondary | ICD-10-CM | POA: Diagnosis not present

## 2024-06-27 DIAGNOSIS — I5020 Unspecified systolic (congestive) heart failure: Secondary | ICD-10-CM | POA: Diagnosis not present

## 2024-06-28 DIAGNOSIS — I5020 Unspecified systolic (congestive) heart failure: Secondary | ICD-10-CM | POA: Diagnosis not present

## 2024-06-29 ENCOUNTER — Other Ambulatory Visit (HOSPITAL_COMMUNITY): Payer: Self-pay

## 2024-06-29 ENCOUNTER — Other Ambulatory Visit: Payer: Self-pay

## 2024-06-29 ENCOUNTER — Other Ambulatory Visit: Payer: Self-pay | Admitting: Student

## 2024-06-29 DIAGNOSIS — I5020 Unspecified systolic (congestive) heart failure: Secondary | ICD-10-CM | POA: Diagnosis not present

## 2024-06-29 MED ORDER — EMPAGLIFLOZIN 10 MG PO TABS
10.0000 mg | ORAL_TABLET | Freq: Every day | ORAL | 2 refills | Status: DC
  Filled 2024-06-29: qty 30, 30d supply, fill #0

## 2024-06-29 MED ORDER — SPIRONOLACTONE 25 MG PO TABS
25.0000 mg | ORAL_TABLET | Freq: Every day | ORAL | 2 refills | Status: DC
  Filled 2024-06-29: qty 30, 30d supply, fill #0
  Filled 2024-07-31: qty 30, 30d supply, fill #1
  Filled 2024-09-17: qty 30, 30d supply, fill #2

## 2024-06-29 MED ORDER — FUROSEMIDE 20 MG PO TABS
40.0000 mg | ORAL_TABLET | Freq: Every day | ORAL | 2 refills | Status: DC
  Filled 2024-06-29: qty 60, 30d supply, fill #0
  Filled 2024-07-31: qty 60, 30d supply, fill #1
  Filled 2024-09-17: qty 60, 30d supply, fill #2

## 2024-06-29 MED ORDER — POTASSIUM CHLORIDE CRYS ER 20 MEQ PO TBCR
EXTENDED_RELEASE_TABLET | ORAL | 0 refills | Status: DC
Start: 2024-06-29 — End: 2024-11-07
  Filled 2024-06-29: qty 270, 90d supply, fill #0

## 2024-06-30 ENCOUNTER — Other Ambulatory Visit (HOSPITAL_COMMUNITY): Payer: Self-pay

## 2024-06-30 DIAGNOSIS — I5020 Unspecified systolic (congestive) heart failure: Secondary | ICD-10-CM | POA: Diagnosis not present

## 2024-07-01 DIAGNOSIS — I5020 Unspecified systolic (congestive) heart failure: Secondary | ICD-10-CM | POA: Diagnosis not present

## 2024-07-02 ENCOUNTER — Ambulatory Visit: Admitting: Podiatrist

## 2024-07-02 ENCOUNTER — Ambulatory Visit: Admitting: Podiatry

## 2024-07-02 DIAGNOSIS — I5020 Unspecified systolic (congestive) heart failure: Secondary | ICD-10-CM | POA: Diagnosis not present

## 2024-07-03 DIAGNOSIS — I5020 Unspecified systolic (congestive) heart failure: Secondary | ICD-10-CM | POA: Diagnosis not present

## 2024-07-04 DIAGNOSIS — I5020 Unspecified systolic (congestive) heart failure: Secondary | ICD-10-CM | POA: Diagnosis not present

## 2024-07-04 DIAGNOSIS — Z9011 Acquired absence of right breast and nipple: Secondary | ICD-10-CM | POA: Diagnosis not present

## 2024-07-05 DIAGNOSIS — Z9011 Acquired absence of right breast and nipple: Secondary | ICD-10-CM | POA: Diagnosis not present

## 2024-07-05 DIAGNOSIS — I5020 Unspecified systolic (congestive) heart failure: Secondary | ICD-10-CM | POA: Diagnosis not present

## 2024-07-06 ENCOUNTER — Other Ambulatory Visit (HOSPITAL_COMMUNITY): Payer: Self-pay

## 2024-07-06 ENCOUNTER — Other Ambulatory Visit: Payer: Self-pay

## 2024-07-06 DIAGNOSIS — I5020 Unspecified systolic (congestive) heart failure: Secondary | ICD-10-CM | POA: Diagnosis not present

## 2024-07-07 ENCOUNTER — Other Ambulatory Visit (HOSPITAL_COMMUNITY): Payer: Self-pay

## 2024-07-07 DIAGNOSIS — I5020 Unspecified systolic (congestive) heart failure: Secondary | ICD-10-CM | POA: Diagnosis not present

## 2024-07-08 DIAGNOSIS — I5020 Unspecified systolic (congestive) heart failure: Secondary | ICD-10-CM | POA: Diagnosis not present

## 2024-07-09 ENCOUNTER — Other Ambulatory Visit (HOSPITAL_COMMUNITY): Payer: Self-pay

## 2024-07-09 DIAGNOSIS — I5020 Unspecified systolic (congestive) heart failure: Secondary | ICD-10-CM | POA: Diagnosis not present

## 2024-07-10 DIAGNOSIS — Z9011 Acquired absence of right breast and nipple: Secondary | ICD-10-CM | POA: Diagnosis not present

## 2024-07-10 DIAGNOSIS — I5020 Unspecified systolic (congestive) heart failure: Secondary | ICD-10-CM | POA: Diagnosis not present

## 2024-07-10 DIAGNOSIS — C50912 Malignant neoplasm of unspecified site of left female breast: Secondary | ICD-10-CM | POA: Diagnosis not present

## 2024-07-11 ENCOUNTER — Ambulatory Visit: Payer: Self-pay

## 2024-07-11 DIAGNOSIS — I5020 Unspecified systolic (congestive) heart failure: Secondary | ICD-10-CM | POA: Diagnosis not present

## 2024-07-12 ENCOUNTER — Other Ambulatory Visit (HOSPITAL_COMMUNITY): Payer: Self-pay

## 2024-07-12 DIAGNOSIS — I5020 Unspecified systolic (congestive) heart failure: Secondary | ICD-10-CM | POA: Diagnosis not present

## 2024-07-13 DIAGNOSIS — I5020 Unspecified systolic (congestive) heart failure: Secondary | ICD-10-CM | POA: Diagnosis not present

## 2024-07-14 DIAGNOSIS — I5020 Unspecified systolic (congestive) heart failure: Secondary | ICD-10-CM | POA: Diagnosis not present

## 2024-07-15 DIAGNOSIS — I5020 Unspecified systolic (congestive) heart failure: Secondary | ICD-10-CM | POA: Diagnosis not present

## 2024-07-16 DIAGNOSIS — I5020 Unspecified systolic (congestive) heart failure: Secondary | ICD-10-CM | POA: Diagnosis not present

## 2024-07-17 DIAGNOSIS — I5020 Unspecified systolic (congestive) heart failure: Secondary | ICD-10-CM | POA: Diagnosis not present

## 2024-07-18 ENCOUNTER — Ambulatory Visit: Admitting: Podiatry

## 2024-07-18 DIAGNOSIS — I5020 Unspecified systolic (congestive) heart failure: Secondary | ICD-10-CM | POA: Diagnosis not present

## 2024-07-19 ENCOUNTER — Ambulatory Visit: Admitting: Rehabilitation

## 2024-07-19 DIAGNOSIS — I5020 Unspecified systolic (congestive) heart failure: Secondary | ICD-10-CM | POA: Diagnosis not present

## 2024-07-20 DIAGNOSIS — I5020 Unspecified systolic (congestive) heart failure: Secondary | ICD-10-CM | POA: Diagnosis not present

## 2024-07-21 DIAGNOSIS — I5020 Unspecified systolic (congestive) heart failure: Secondary | ICD-10-CM | POA: Diagnosis not present

## 2024-07-22 DIAGNOSIS — I5020 Unspecified systolic (congestive) heart failure: Secondary | ICD-10-CM | POA: Diagnosis not present

## 2024-07-23 DIAGNOSIS — I5020 Unspecified systolic (congestive) heart failure: Secondary | ICD-10-CM | POA: Diagnosis not present

## 2024-07-24 DIAGNOSIS — I890 Lymphedema, not elsewhere classified: Secondary | ICD-10-CM | POA: Diagnosis not present

## 2024-07-24 DIAGNOSIS — I5020 Unspecified systolic (congestive) heart failure: Secondary | ICD-10-CM | POA: Diagnosis not present

## 2024-07-25 DIAGNOSIS — I5020 Unspecified systolic (congestive) heart failure: Secondary | ICD-10-CM | POA: Diagnosis not present

## 2024-07-26 DIAGNOSIS — I5020 Unspecified systolic (congestive) heart failure: Secondary | ICD-10-CM | POA: Diagnosis not present

## 2024-07-27 ENCOUNTER — Telehealth: Payer: Self-pay

## 2024-07-27 DIAGNOSIS — R7303 Prediabetes: Secondary | ICD-10-CM

## 2024-07-27 DIAGNOSIS — I10 Essential (primary) hypertension: Secondary | ICD-10-CM

## 2024-07-27 DIAGNOSIS — I5020 Unspecified systolic (congestive) heart failure: Secondary | ICD-10-CM | POA: Diagnosis not present

## 2024-07-27 NOTE — Progress Notes (Signed)
 Complex Care Management Note  Care Guide Note 07/27/2024 Name: [name] MRN: [id number] DOB: March 22, 1961  [name] is a 63 y.o. year old female who sees Shitarev, Dimitry, MD for primary care. I reached out to Barnie JONETTA Agent by phone today to offer complex care management services.  Ms. Bumgardner was given information about Complex Care Management services today including:   The Complex Care Management services include support from the care team which includes your Nurse Care Manager, Clinical Social Worker, or Pharmacist.  The Complex Care Management team is here to help remove barriers to the health concerns and goals most important to you. Complex Care Management services are voluntary, and the patient may decline or stop services at any time by request to their care team member.   Complex Care Management Consent Status: Patient agreed to services and verbal consent obtained.   Follow up plan:  Telephone appointment with complex care management team member scheduled for:  07/30/2024  Encounter Outcome:  Patient Scheduled  Jeoffrey Buffalo , RMA     [facility] Guide  Direct Dial: [phone]  Website: [email or web address]

## 2024-07-28 DIAGNOSIS — I5020 Unspecified systolic (congestive) heart failure: Secondary | ICD-10-CM | POA: Diagnosis not present

## 2024-07-29 DIAGNOSIS — I5020 Unspecified systolic (congestive) heart failure: Secondary | ICD-10-CM | POA: Diagnosis not present

## 2024-07-30 ENCOUNTER — Telehealth: Payer: Self-pay

## 2024-07-30 DIAGNOSIS — I5020 Unspecified systolic (congestive) heart failure: Secondary | ICD-10-CM | POA: Diagnosis not present

## 2024-07-30 NOTE — Patient Instructions (Signed)
 Barnie JONETTA Agent - I  have rescheduled your appointment per your request for tomorrow 07-31-2024 at 11:00. I look forward to speaking with you then.  Hendricks Her RN, BSN  [place] [facility] RN Case Information systems manager [phone]

## 2024-07-31 ENCOUNTER — Other Ambulatory Visit: Payer: Self-pay

## 2024-07-31 ENCOUNTER — Other Ambulatory Visit: Payer: Self-pay | Admitting: Student

## 2024-07-31 ENCOUNTER — Other Ambulatory Visit (HOSPITAL_COMMUNITY): Payer: Self-pay

## 2024-07-31 DIAGNOSIS — I5020 Unspecified systolic (congestive) heart failure: Secondary | ICD-10-CM | POA: Diagnosis not present
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
## 2024-07-31 NOTE — Patient Instructions (Signed)
 Visit Information  Ms. Gorelick was given information about Medicaid Managed Care team care coordination services as a part of their [facility] Community Plan Medicaid benefit.   If you would like to schedule transportation through your [facility], please call the following number at least 2 days in advance of your appointment: [phone]   Rides for urgent appointments can also be made after hours by calling Member Services.  Call the Behavioral Health Crisis Line at [phone], at any time, 24 hours a day, 7 days a week. If you are in danger or need immediate medical attention call 911.   Ms. Wales - following are the goals we discussed in your visit today:   Goals Addressed             This Visit's Progress    VBCI RN Care Plan   On track    Problems:  Chronic Disease Management support and education needs related to CHF  Goal: Over the next 90 days the Patient will attend all scheduled medical appointments: with providers as evidenced by electronic medical record encounters for appointments         continue to work with RN Care Manager and/or Social Worker to address care management and care coordination needs related to CHF as evidenced by adherence to care management team scheduled appointments     demonstrate a decrease CHF in exacerbations as evidenced by avoiding weight gain of more than 2 pounds daily  take all medications exactly as prescribed and will call provider for medication related questions as evidenced by contacting provider for any questions regarding medications     verbalize basic understanding of CHF disease process and self health management plan as evidenced by verbalizing signs and symptoms of worsening heart failure , daily weights and following a DASH diet  verbalize understanding of plan for management of CHF as evidenced by taking medications as prescribed, reporting a weight gain of  > 3  pounds daily and > 5 pounds weekly    Interventions:   Heart Failure Interventions: Provided education on low sodium diet Reviewed Heart Failure Action Plan in depth and provided written copy Assessed need for readable accurate scales in home Provided education about placing scale on hard, flat surface Advised patient to weigh each morning after emptying bladder Discussed importance of daily weight and advised patient to weigh and record daily Reviewed role of diuretics in prevention of fluid overload and management of heart failure; Discussed the importance of keeping all appointments with provider Provided patient with education about the role of exercise in the management of heart failure Screening for signs and symptoms of depression related to chronic disease state  Assessed social determinant of health barriers   Patient Self-Care Activities:  check blood pressure daily learn about high blood pressure keep a blood pressure log take blood pressure log to all doctor appointments keep all doctor appointments take medications for blood pressure exactly as prescribed eat more whole grains, fruits and vegetables, lean meats and healthy fats limit salt intake to 1500 mg/day  Plan:  Telephone follow up appointment with care management team member scheduled for:  08-21-2024 11:00 AM           VBCI RN Care Plan   On track    Problems:  Chronic Disease Management support and education needs related to HTN  Goal: Over the next 90  days the Patient will attend all scheduled medical appointments: with providers as evidenced by electronic medical record  appointment encounter         continue to work with RN Care Manager and/or Social Worker to address care management and care coordination needs related to HTN as evidenced by adherence to care management team scheduled appointments     demonstrate a decrease HTN in exacerbations as evidenced by maintaining a BP  < 140/80 demonstrate ongoing self health care management  ability to monitor for exacerbations of increased blood pressure  as evidenced by     take all medications exactly as prescribed and will call provider for medication related questions as evidenced by calls made to provider for medication questions     verbalize basic understanding of HTN disease process and self health management plan as evidenced by verbalizing what causes HTN and the importance of medications and their role in managing blood pressure and keeping a blood pressure log   Interventions:   Hypertension Interventions: Last practice recorded BP readings:  BP Readings from Last 3 Encounters:  06/07/24 125/80  01/31/24 (!) 148/87  11/29/23 (!) 150/88   Most recent eGFR/CrCl:  Lab Results  Component Value Date   EGFR 68 08/08/2023    No components found for: CRCL  Evaluation of current treatment plan related to hypertension self management and patient's adherence to plan as established by provider Provided education to patient re: stroke prevention, s/s of heart attack and stroke Reviewed medications with patient and discussed importance of compliance Counseled on adverse effects of illicit drug and excessive alcohol use in patients with high blood pressure  Counseled on the importance of exercise goals with target of 150 minutes per week Discussed plans with patient for ongoing care management follow up and provided patient with direct contact information for care management team Advised patient, providing education and rationale, to monitor blood pressure daily and record, calling PCP for findings outside established parameters Provided education on prescribed diet DASH  Screening for signs and symptoms of depression related to chronic disease state  Assessed social determinant of health barriers  Patient Self-Care Activities:  Attend all scheduled provider appointments Call pharmacy for medication refills 3-7 days in advance of running out of medications Call provider  office for new concerns or questions  Take medications as prescribed   check blood pressure daily keep a blood pressure log keep all doctor appointments take medications for blood pressure exactly as prescribed limit salt intake to 1500 mg/day  Plan:  Telephone follow up appointment with care management team member scheduled for:  08-21-2024             Please see education materials related to hypertension and congestive heart failure provided by MyChart link.  Patient verbalizes understanding of instructions and care plan provided today and agrees to view in MyChart. Active MyChart status and patient understanding of how to access instructions and care plan via MyChart confirmed with patient.     Telephone follow up appointment with Managed Medicaid care management team member scheduled for:08-21-2024 11:00 AM  Hendricks Her RN, BSN   I VBCI-Population Health RN Case Manager   Direct [phone]   Following is a copy of your plan of care:  There are no care plans that you recently modified to display for this patient.

## 2024-08-01 ENCOUNTER — Other Ambulatory Visit: Payer: Self-pay | Admitting: Family Medicine

## 2024-08-01 DIAGNOSIS — I5020 Unspecified systolic (congestive) heart failure: Secondary | ICD-10-CM | POA: Diagnosis not present

## 2024-08-02 ENCOUNTER — Telehealth: Payer: Self-pay

## 2024-08-02 ENCOUNTER — Other Ambulatory Visit: Payer: Self-pay | Admitting: Family Medicine

## 2024-08-02 ENCOUNTER — Other Ambulatory Visit (HOSPITAL_COMMUNITY): Payer: Self-pay

## 2024-08-02 ENCOUNTER — Other Ambulatory Visit: Payer: Self-pay

## 2024-08-02 DIAGNOSIS — I5020 Unspecified systolic (congestive) heart failure: Secondary | ICD-10-CM | POA: Diagnosis not present

## 2024-08-02 DIAGNOSIS — I10 Essential (primary) hypertension: Secondary | ICD-10-CM

## 2024-08-02 DIAGNOSIS — I5022 Chronic systolic (congestive) heart failure: Secondary | ICD-10-CM

## 2024-08-02 MED ORDER — HYDROCORTISONE 2.5 % EX OINT
TOPICAL_OINTMENT | Freq: Two times a day (BID) | CUTANEOUS | 0 refills | Status: DC
  Filled 2024-08-02: qty 28.35, 20d supply, fill #0

## 2024-08-02 MED ORDER — EZETIMIBE 10 MG PO TABS
10.0000 mg | ORAL_TABLET | Freq: Every day | ORAL | 3 refills | Status: AC
  Filled 2024-08-02: qty 90, 90d supply, fill #0
  Filled 2024-11-07: qty 90, 90d supply, fill #1

## 2024-08-02 NOTE — Progress Notes (Signed)
 Complex Care Management program requested a home scale and BP monitor for [name] for her CHF and HTN.  DME order placed for both for team to process.  Appreciate assistance.   Kathrine Melena, DO

## 2024-08-03 ENCOUNTER — Telehealth: Payer: Self-pay

## 2024-08-03 DIAGNOSIS — I5020 Unspecified systolic (congestive) heart failure: Secondary | ICD-10-CM | POA: Diagnosis not present

## 2024-08-03 NOTE — Telephone Encounter (Signed)
 DME orders placed for home scale and blood pressure monitor.   Community message sent to [employer] for processing.

## 2024-08-04 DIAGNOSIS — I5020 Unspecified systolic (congestive) heart failure: Secondary | ICD-10-CM | POA: Diagnosis not present

## 2024-08-05 DIAGNOSIS — I5020 Unspecified systolic (congestive) heart failure: Secondary | ICD-10-CM | POA: Diagnosis not present

## 2024-08-06 DIAGNOSIS — I5020 Unspecified systolic (congestive) heart failure: Secondary | ICD-10-CM | POA: Diagnosis not present

## 2024-08-07 ENCOUNTER — Ambulatory Visit: Admitting: Rehabilitation

## 2024-08-07 DIAGNOSIS — I5020 Unspecified systolic (congestive) heart failure: Secondary | ICD-10-CM | POA: Diagnosis not present

## 2024-08-08 DIAGNOSIS — I5020 Unspecified systolic (congestive) heart failure: Secondary | ICD-10-CM | POA: Diagnosis not present

## 2024-08-09 DIAGNOSIS — I5020 Unspecified systolic (congestive) heart failure: Secondary | ICD-10-CM | POA: Diagnosis not present

## 2024-08-10 DIAGNOSIS — I5020 Unspecified systolic (congestive) heart failure: Secondary | ICD-10-CM | POA: Diagnosis not present

## 2024-08-11 DIAGNOSIS — I5020 Unspecified systolic (congestive) heart failure: Secondary | ICD-10-CM | POA: Diagnosis not present

## 2024-08-12 DIAGNOSIS — I5020 Unspecified systolic (congestive) heart failure: Secondary | ICD-10-CM | POA: Diagnosis not present

## 2024-08-13 ENCOUNTER — Telehealth (HOSPITAL_COMMUNITY): Payer: Self-pay | Admitting: Pharmacy Technician

## 2024-08-13 ENCOUNTER — Other Ambulatory Visit (HOSPITAL_COMMUNITY): Payer: Self-pay

## 2024-08-13 ENCOUNTER — Ambulatory Visit (INDEPENDENT_AMBULATORY_CARE_PROVIDER_SITE_OTHER): Admitting: Family Medicine

## 2024-08-13 ENCOUNTER — Encounter: Payer: Self-pay | Admitting: Family Medicine

## 2024-08-13 ENCOUNTER — Other Ambulatory Visit (HOSPITAL_COMMUNITY)
Admission: RE | Admit: 2024-08-13 | Discharge: 2024-08-13 | Disposition: A | Discharge status: Home / Self Care | Source: Ambulatory Visit | Attending: Family Medicine | Admitting: Family Medicine

## 2024-08-13 ENCOUNTER — Ambulatory Visit: Payer: Self-pay | Admitting: Family Medicine

## 2024-08-13 VITALS — BP 122/88 | HR 64 | Ht 65.0 in | Wt 215.0 lb

## 2024-08-13 DIAGNOSIS — I5020 Unspecified systolic (congestive) heart failure: Secondary | ICD-10-CM | POA: Diagnosis not present

## 2024-08-13 DIAGNOSIS — M25512 Pain in left shoulder: Secondary | ICD-10-CM | POA: Diagnosis present

## 2024-08-13 DIAGNOSIS — R102 Pelvic and perineal pain: Secondary | ICD-10-CM | POA: Insufficient documentation

## 2024-08-13 DIAGNOSIS — R300 Dysuria: Secondary | ICD-10-CM | POA: Diagnosis not present

## 2024-08-13 LAB — POCT URINALYSIS DIP (MANUAL ENTRY)
Bilirubin, UA: NEGATIVE
Blood, UA: NEGATIVE
Glucose, UA: NEGATIVE mg/dL
Ketones, POC UA: NEGATIVE mg/dL
Leukocytes, UA: NEGATIVE
Nitrite, UA: NEGATIVE
Protein Ur, POC: NEGATIVE mg/dL
Spec Grav, UA: 1.02 (ref 1.010–1.025)
Urobilinogen, UA: 0.2 U/dL
pH, UA: 6 (ref 5.0–8.0)

## 2024-08-13 MED ORDER — EMPAGLIFLOZIN 10 MG PO TABS
10.0000 mg | ORAL_TABLET | Freq: Every day | ORAL | 2 refills | Status: DC
  Filled 2024-08-13: qty 30, 30d supply, fill #0
  Filled 2024-09-17: qty 30, 30d supply, fill #1
  Filled 2024-10-15: qty 30, 30d supply, fill #2

## 2024-08-13 NOTE — Patient Instructions (Addendum)
 It was so good to see you today! Thank you for allowing me to take care of you.  Today we discussed the following concerns and plans:  Pelvic pain - swab and urine sample collected today - I will let you know the results.  Shoulder pain - An x-ray was ordered for you---you do not need an appointment to have this completed. I recommend going to [facility] [address] [place]  If you have any concerns, please call the clinic or schedule an appointment.  It was a pleasure to take care of you today. Be well!  Lauraine Norse, DO [facility], PGY-2  Do you need your medications delivered to your home?   We'll send your prescription to the [facility] for delivery.          Address: [address] [place], [place], [place] [address]          Phone: [phone]  Please call the [facility] to speak with a pharmacist and set up your home medication delivery. If you have any questions, feel free to contact us  -- we're happy to help!  Other [facility] that offer affordable prices on both prescriptions and over-the-counter items, as well as convenient services like vaccinations, are  [facility], [facility]         Address:  [address], [place], [place] [address]         Phone: [phone]  [facility], located in the [facility]        Address: [address] [place], [place] [address]        Phone: [phone]  [facility], at [facility]       Address: [address], [place], [place] [address]       Phone: [phone]  [facility], at [facility]       Address: [address] Floor, [place], [place] [address]       Phone: [phone]
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
## 2024-08-13 NOTE — Telephone Encounter (Signed)
 PA request has been Received. New Encounter has been or will be created for follow up. For additional info see Pharmacy Prior Auth telephone encounter from 08/13/24.

## 2024-08-14 ENCOUNTER — Other Ambulatory Visit (HOSPITAL_COMMUNITY): Payer: Self-pay

## 2024-08-14 ENCOUNTER — Other Ambulatory Visit: Payer: Self-pay

## 2024-08-14 ENCOUNTER — Telehealth (HOSPITAL_COMMUNITY): Payer: Self-pay

## 2024-08-14 ENCOUNTER — Other Ambulatory Visit: Payer: Self-pay | Admitting: Family Medicine

## 2024-08-14 DIAGNOSIS — I5020 Unspecified systolic (congestive) heart failure: Secondary | ICD-10-CM | POA: Diagnosis not present

## 2024-08-14 LAB — CERVICOVAGINAL ANCILLARY ONLY
Bacterial Vaginitis (gardnerella): NEGATIVE
Candida Glabrata: NEGATIVE
Candida Vaginitis: POSITIVE — AB
Chlamydia: NEGATIVE
Comment: NEGATIVE
Comment: NEGATIVE
Comment: NEGATIVE
Comment: NEGATIVE
Comment: NEGATIVE
Comment: NORMAL
Neisseria Gonorrhea: NEGATIVE
Trichomonas: NEGATIVE

## 2024-08-14 MED ORDER — FLUCONAZOLE 150 MG PO TABS
150.0000 mg | ORAL_TABLET | Freq: Once | ORAL | 0 refills | Status: AC
Start: 2024-08-14 — End: 2024-08-17
  Filled 2024-08-14: qty 2, 2d supply, fill #0

## 2024-08-14 NOTE — Telephone Encounter (Signed)
 Pharmacy Patient Advocate Encounter  Received notification from [facility] MEDICAID that Prior Authorization for Jardiance  10MG  tablets  has been APPROVED from 08/14/24 to 08/14/25. Ran test claim, Copay is $4. This test claim was processed through [facility]- copay amounts may vary at other pharmacies due to pharmacy/plan contracts, or as the patient moves through the different stages of their insurance plan.   PA #/Case ID/Reference #: [id number]

## 2024-08-14 NOTE — Telephone Encounter (Signed)
 Pharmacy Patient Advocate Encounter   Received notification from Pt Calls Messages that prior authorization for Jardiance  10MG  tablets  is required/requested.   Insurance verification completed.   The patient is insured through [facility] MEDICAID .   Per test claim: PA required; PA submitted to above mentioned insurance via Latent Key/confirmation #/EOC [id number] Status is pending

## 2024-08-15 ENCOUNTER — Other Ambulatory Visit (HOSPITAL_COMMUNITY): Payer: Self-pay

## 2024-08-15 ENCOUNTER — Other Ambulatory Visit: Payer: Self-pay

## 2024-08-15 DIAGNOSIS — I5020 Unspecified systolic (congestive) heart failure: Secondary | ICD-10-CM | POA: Diagnosis not present

## 2024-08-16 DIAGNOSIS — I5020 Unspecified systolic (congestive) heart failure: Secondary | ICD-10-CM | POA: Diagnosis not present

## 2024-08-17 DIAGNOSIS — I5020 Unspecified systolic (congestive) heart failure: Secondary | ICD-10-CM | POA: Diagnosis not present

## 2024-08-18 DIAGNOSIS — I5020 Unspecified systolic (congestive) heart failure: Secondary | ICD-10-CM | POA: Diagnosis not present

## 2024-08-19 DIAGNOSIS — I5020 Unspecified systolic (congestive) heart failure: Secondary | ICD-10-CM | POA: Diagnosis not present

## 2024-08-20 DIAGNOSIS — I5020 Unspecified systolic (congestive) heart failure: Secondary | ICD-10-CM | POA: Diagnosis not present

## 2024-08-21 ENCOUNTER — Telehealth: Payer: Self-pay

## 2024-08-21 ENCOUNTER — Other Ambulatory Visit: Payer: Self-pay

## 2024-08-21 DIAGNOSIS — I5020 Unspecified systolic (congestive) heart failure: Secondary | ICD-10-CM | POA: Diagnosis not present

## 2024-08-21 NOTE — Telephone Encounter (Signed)
 Received the following message.   Kay Hendricks MATSU, RN  Janna Ferrier, DO; Lunell Robart, Chiquita BROCKS, RN; Glendia Damien SQUIBB, CMA; Delores Suzann HERO, MD Good afternoon, Could someone please fax the DME order shown in the patient's chart for a blood pressure cuff and home scale to [name] at [phone]?  It appears the original order was sent to [employer], but they do not supply these items. [name] is our home delivery DME provider for this Medicaid patient. I do not have the ability to fax or I would do it myself.  Thank you!   Faxed orders to [facility] and Surgical supply.   Chiquita BROCKS English, RN

## 2024-08-21 NOTE — Patient Instructions (Signed)
 Visit Information  Thank you for taking time to visit with me today. Please don't hesitate to contact me if I can be of assistance to you before our next scheduled appointment.  Your next care management appointment is by telephone on 08-27-2024 at 2:00 PM   Telephone follow-up in 1 week  Please call the care guide team at [phone] if you need to cancel, schedule, or reschedule an appointment.   Please call the Suicide and Crisis Lifeline: 988 call the [place]  National Suicide Prevention Lifeline: 678-646-6643 or TTY: 334-484-6767 TTY (623) 156-9420) to talk to a trained counselor call 1-800-273-TALK (toll free, 24 hour hotline) go to [facility] [address], [place] [phone]) call 911 if you are experiencing a Mental Health or Behavioral Health Crisis or need someone to talk to.  Hendricks Her RN, BSN   I VBCI-Population Health RN Case Information systems manager [phone]

## 2024-08-22 DIAGNOSIS — I5020 Unspecified systolic (congestive) heart failure: Secondary | ICD-10-CM | POA: Diagnosis not present

## 2024-08-23 DIAGNOSIS — I5020 Unspecified systolic (congestive) heart failure: Secondary | ICD-10-CM | POA: Diagnosis not present

## 2024-08-23 NOTE — Telephone Encounter (Signed)
 Received follow up message that order needs to be signed by physician.   Order printed and placed in Dr. Janna box for signature, as her name is on original order.   Savannah JAYSON English, RN

## 2024-08-24 DIAGNOSIS — I5020 Unspecified systolic (congestive) heart failure: Secondary | ICD-10-CM | POA: Diagnosis not present

## 2024-08-24 NOTE — Telephone Encounter (Signed)
 Signed and placed in 'To Be Faxed'.  Thank you, Kathrine Melena, DO 08/24/24 2:39 PM

## 2024-08-25 DIAGNOSIS — I5020 Unspecified systolic (congestive) heart failure: Secondary | ICD-10-CM | POA: Diagnosis not present

## 2024-08-26 DIAGNOSIS — I5020 Unspecified systolic (congestive) heart failure: Secondary | ICD-10-CM | POA: Diagnosis not present

## 2024-08-27 ENCOUNTER — Telehealth: Payer: Self-pay

## 2024-08-27 DIAGNOSIS — I5020 Unspecified systolic (congestive) heart failure: Secondary | ICD-10-CM | POA: Diagnosis not present

## 2024-08-27 NOTE — Patient Instructions (Signed)
 Barnie JONETTA Agent - I am sorry I was unable to reach you today for our scheduled appointment. I work with Toma, Dimitry, MD and am calling to support your healthcare needs. I have rescheduled you for this Thursday 08-30-2024 at 1:00 PM . I look forward to speaking with you soon.   Thank you,  Hendricks Her RN, BSN  [place] I VBCI-Population Health RN Case Manager   Direct [phone]

## 2024-08-28 ENCOUNTER — Ambulatory Visit: Admitting: Rehabilitation

## 2024-08-28 ENCOUNTER — Ambulatory Visit: Admitting: Podiatry

## 2024-08-28 DIAGNOSIS — I5020 Unspecified systolic (congestive) heart failure: Secondary | ICD-10-CM | POA: Diagnosis not present

## 2024-08-29 ENCOUNTER — Telehealth: Payer: Self-pay

## 2024-08-29 DIAGNOSIS — C50412 Malignant neoplasm of upper-outer quadrant of left female breast: Secondary | ICD-10-CM

## 2024-08-29 DIAGNOSIS — I890 Lymphedema, not elsewhere classified: Secondary | ICD-10-CM

## 2024-08-29 DIAGNOSIS — I5020 Unspecified systolic (congestive) heart failure: Secondary | ICD-10-CM | POA: Diagnosis not present

## 2024-08-29 DIAGNOSIS — C50811 Malignant neoplasm of overlapping sites of right female breast: Secondary | ICD-10-CM

## 2024-08-29 NOTE — Telephone Encounter (Signed)
 Pt called and states she has noted visible knots on her abdomen and she is concerned her cancer has recurred. She denies abd discomfort, normal Bms and no trouble urinating, no N/V/D. She also states she is experiencing LUE tightness and is requesting to be referred back to specialty rehab. Pt was offered [facility] appt with Morna Kendall, DNP and she asked for an appt next week. She was scheduled for 09/04/24 at 1040 and accepted. Order for referral entered per MD.

## 2024-08-30 ENCOUNTER — Telehealth: Payer: Self-pay

## 2024-08-30 DIAGNOSIS — I5020 Unspecified systolic (congestive) heart failure: Secondary | ICD-10-CM | POA: Diagnosis not present

## 2024-08-30 NOTE — Patient Instructions (Addendum)
 [name] Agent - I am sorry I was unable to reach you today for our scheduled appointment. I work with Toma, Dimitry, MD and am calling to support your healthcare needs.I have rescheduled you for 09-05-2024 at 10:30 PM  Please contact me at [phone] . I look forward to speaking with you soon.   Thank you,  Hendricks Her RN, BSN  [place] I VBCI-Population Health RN Case Manager   Direct [phone]

## 2024-08-31 DIAGNOSIS — I5020 Unspecified systolic (congestive) heart failure: Secondary | ICD-10-CM | POA: Diagnosis not present

## 2024-09-02 DIAGNOSIS — I5020 Unspecified systolic (congestive) heart failure: Secondary | ICD-10-CM | POA: Diagnosis not present

## 2024-09-03 DIAGNOSIS — I5020 Unspecified systolic (congestive) heart failure: Secondary | ICD-10-CM | POA: Diagnosis not present

## 2024-09-04 ENCOUNTER — Inpatient Hospital Stay: Attending: Adult Health | Admitting: Adult Health

## 2024-09-04 ENCOUNTER — Telehealth: Payer: Self-pay

## 2024-09-04 DIAGNOSIS — I5020 Unspecified systolic (congestive) heart failure: Secondary | ICD-10-CM | POA: Diagnosis not present

## 2024-09-04 NOTE — Telephone Encounter (Signed)
 Called patient due to not showing up for her 1040 app with Morna Kendall NP, she stated she forgot the appointment she had a family emergency last night and was not able to be here for the appointment. Will get scheduling to reschedule her appointment.

## 2024-09-05 ENCOUNTER — Ambulatory Visit

## 2024-09-05 ENCOUNTER — Telehealth: Payer: Self-pay

## 2024-09-05 DIAGNOSIS — I5020 Unspecified systolic (congestive) heart failure: Secondary | ICD-10-CM | POA: Diagnosis not present

## 2024-09-06 DIAGNOSIS — I5020 Unspecified systolic (congestive) heart failure: Secondary | ICD-10-CM | POA: Diagnosis not present

## 2024-09-07 DIAGNOSIS — I5020 Unspecified systolic (congestive) heart failure: Secondary | ICD-10-CM | POA: Diagnosis not present

## 2024-09-08 DIAGNOSIS — I5020 Unspecified systolic (congestive) heart failure: Secondary | ICD-10-CM | POA: Diagnosis not present

## 2024-09-09 DIAGNOSIS — I5020 Unspecified systolic (congestive) heart failure: Secondary | ICD-10-CM | POA: Diagnosis not present

## 2024-09-10 ENCOUNTER — Ambulatory Visit: Admitting: Podiatry

## 2024-09-10 DIAGNOSIS — I5020 Unspecified systolic (congestive) heart failure: Secondary | ICD-10-CM | POA: Diagnosis not present

## 2024-09-11 ENCOUNTER — Ambulatory Visit
Admission: RE | Admit: 2024-09-11 | Discharge: 2024-09-11 | Disposition: A | Discharge status: Home / Self Care | Source: Ambulatory Visit | Attending: Family Medicine | Admitting: Family Medicine

## 2024-09-11 DIAGNOSIS — M19012 Primary osteoarthritis, left shoulder: Secondary | ICD-10-CM | POA: Diagnosis not present

## 2024-09-11 DIAGNOSIS — I5020 Unspecified systolic (congestive) heart failure: Secondary | ICD-10-CM | POA: Diagnosis not present

## 2024-09-11 DIAGNOSIS — M25512 Pain in left shoulder: Secondary | ICD-10-CM

## 2024-09-12 ENCOUNTER — Ambulatory Visit

## 2024-09-12 DIAGNOSIS — I5020 Unspecified systolic (congestive) heart failure: Secondary | ICD-10-CM | POA: Diagnosis not present

## 2024-09-12 NOTE — Telephone Encounter (Signed)
 Called pt, confirmed DOB.  Discussed xray results which show OA but no fracture. She is continuing to have significant pain; I recommend she follow up in our clinic to discuss next steps. She may need US  for further evaluation. She is continuing PT through [facility] - I think this is a good plan.  Appt scheduled with PCP 10/6.

## 2024-09-13 ENCOUNTER — Inpatient Hospital Stay: Admitting: Adult Health

## 2024-09-14 DIAGNOSIS — I5020 Unspecified systolic (congestive) heart failure: Secondary | ICD-10-CM | POA: Diagnosis not present

## 2024-09-15 DIAGNOSIS — I5020 Unspecified systolic (congestive) heart failure: Secondary | ICD-10-CM | POA: Diagnosis not present

## 2024-09-16 DIAGNOSIS — I5020 Unspecified systolic (congestive) heart failure: Secondary | ICD-10-CM | POA: Diagnosis not present

## 2024-09-17 ENCOUNTER — Other Ambulatory Visit (HOSPITAL_COMMUNITY): Payer: Self-pay

## 2024-09-17 DIAGNOSIS — I5020 Unspecified systolic (congestive) heart failure: Secondary | ICD-10-CM | POA: Diagnosis not present
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
## 2024-09-18 ENCOUNTER — Ambulatory Visit: Attending: Hematology and Oncology | Admitting: Rehabilitation

## 2024-09-18 ENCOUNTER — Other Ambulatory Visit: Payer: Self-pay

## 2024-09-18 DIAGNOSIS — M25612 Stiffness of left shoulder, not elsewhere classified: Secondary | ICD-10-CM | POA: Insufficient documentation

## 2024-09-18 DIAGNOSIS — C50412 Malignant neoplasm of upper-outer quadrant of left female breast: Secondary | ICD-10-CM | POA: Insufficient documentation

## 2024-09-18 DIAGNOSIS — M25512 Pain in left shoulder: Secondary | ICD-10-CM | POA: Insufficient documentation

## 2024-09-18 DIAGNOSIS — I5020 Unspecified systolic (congestive) heart failure: Secondary | ICD-10-CM | POA: Diagnosis not present

## 2024-09-18 DIAGNOSIS — M79602 Pain in left arm: Secondary | ICD-10-CM | POA: Insufficient documentation

## 2024-09-18 DIAGNOSIS — Z483 Aftercare following surgery for neoplasm: Secondary | ICD-10-CM | POA: Insufficient documentation

## 2024-09-18 DIAGNOSIS — G8929 Other chronic pain: Secondary | ICD-10-CM | POA: Insufficient documentation

## 2024-09-18 DIAGNOSIS — I890 Lymphedema, not elsewhere classified: Secondary | ICD-10-CM | POA: Insufficient documentation

## 2024-09-19 DIAGNOSIS — I5020 Unspecified systolic (congestive) heart failure: Secondary | ICD-10-CM | POA: Diagnosis not present

## 2024-09-20 DIAGNOSIS — I5020 Unspecified systolic (congestive) heart failure: Secondary | ICD-10-CM | POA: Diagnosis not present

## 2024-09-21 DIAGNOSIS — I5020 Unspecified systolic (congestive) heart failure: Secondary | ICD-10-CM | POA: Diagnosis not present

## 2024-09-22 DIAGNOSIS — I5020 Unspecified systolic (congestive) heart failure: Secondary | ICD-10-CM | POA: Diagnosis not present

## 2024-09-24 ENCOUNTER — Ambulatory Visit: Payer: Self-pay | Admitting: Family Medicine

## 2024-09-24 DIAGNOSIS — I5020 Unspecified systolic (congestive) heart failure: Secondary | ICD-10-CM | POA: Diagnosis not present
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
## 2024-09-25 DIAGNOSIS — I5020 Unspecified systolic (congestive) heart failure: Secondary | ICD-10-CM | POA: Diagnosis not present

## 2024-09-26 DIAGNOSIS — I5020 Unspecified systolic (congestive) heart failure: Secondary | ICD-10-CM | POA: Diagnosis not present

## 2024-09-27 DIAGNOSIS — I5020 Unspecified systolic (congestive) heart failure: Secondary | ICD-10-CM | POA: Diagnosis not present

## 2024-09-28 DIAGNOSIS — I5020 Unspecified systolic (congestive) heart failure: Secondary | ICD-10-CM | POA: Diagnosis not present

## 2024-09-29 DIAGNOSIS — I5020 Unspecified systolic (congestive) heart failure: Secondary | ICD-10-CM | POA: Diagnosis not present

## 2024-10-01 DIAGNOSIS — I5020 Unspecified systolic (congestive) heart failure: Secondary | ICD-10-CM | POA: Diagnosis not present

## 2024-10-02 DIAGNOSIS — I5020 Unspecified systolic (congestive) heart failure: Secondary | ICD-10-CM | POA: Diagnosis not present

## 2024-10-03 DIAGNOSIS — I5020 Unspecified systolic (congestive) heart failure: Secondary | ICD-10-CM | POA: Diagnosis not present

## 2024-10-04 DIAGNOSIS — I5020 Unspecified systolic (congestive) heart failure: Secondary | ICD-10-CM | POA: Diagnosis not present

## 2024-10-05 ENCOUNTER — Telehealth: Payer: Self-pay

## 2024-10-05 DIAGNOSIS — I5020 Unspecified systolic (congestive) heart failure: Secondary | ICD-10-CM | POA: Diagnosis not present

## 2024-10-05 NOTE — Telephone Encounter (Signed)
 Patient [name] on nurse line requesting medical records from visit on 8/25 and X-rays from [facility] from 9/23 to be sent to [employer].   Attempted to return call to patient to inform of process for med records, she did not answer, unable to [name].   If patient calls back, please advise that she would need to sign release of information for office visit notes to be faxed. Also, we are unable to release records from [facility]. She would need to reach out to them directly in order to obtain X-ray report.   Chiquita JAYSON English, RN

## 2024-10-06 DIAGNOSIS — I5020 Unspecified systolic (congestive) heart failure: Secondary | ICD-10-CM | POA: Diagnosis not present

## 2024-10-07 DIAGNOSIS — I5020 Unspecified systolic (congestive) heart failure: Secondary | ICD-10-CM | POA: Diagnosis not present

## 2024-10-08 ENCOUNTER — Telehealth: Payer: Self-pay | Admitting: Family Medicine

## 2024-10-08 DIAGNOSIS — I5020 Unspecified systolic (congestive) heart failure: Secondary | ICD-10-CM | POA: Diagnosis not present

## 2024-10-08 NOTE — Telephone Encounter (Signed)
 Reviewed form and placed in PCP's box for completion.  Cena JONELLE Pesa, CMA

## 2024-10-10 DIAGNOSIS — I5020 Unspecified systolic (congestive) heart failure: Secondary | ICD-10-CM | POA: Diagnosis not present

## 2024-10-11 DIAGNOSIS — I5020 Unspecified systolic (congestive) heart failure: Secondary | ICD-10-CM | POA: Diagnosis not present

## 2024-10-14 DIAGNOSIS — I5020 Unspecified systolic (congestive) heart failure: Secondary | ICD-10-CM | POA: Diagnosis not present

## 2024-10-15 ENCOUNTER — Other Ambulatory Visit: Payer: Self-pay | Admitting: Family Medicine

## 2024-10-15 ENCOUNTER — Other Ambulatory Visit: Payer: Self-pay

## 2024-10-15 ENCOUNTER — Other Ambulatory Visit (HOSPITAL_COMMUNITY): Payer: Self-pay

## 2024-10-15 DIAGNOSIS — I5020 Unspecified systolic (congestive) heart failure: Secondary | ICD-10-CM | POA: Diagnosis not present

## 2024-10-15 MED ORDER — SPIRONOLACTONE 25 MG PO TABS
25.0000 mg | ORAL_TABLET | Freq: Every day | ORAL | 2 refills | Status: AC
  Filled 2024-10-15: qty 30, 30d supply, fill #0
  Filled 2024-11-19: qty 30, 30d supply, fill #1
  Filled 2025-01-01: qty 30, 30d supply, fill #2

## 2024-10-15 MED ORDER — HYDROCORTISONE 2.5 % EX OINT
TOPICAL_OINTMENT | Freq: Two times a day (BID) | CUTANEOUS | 0 refills | Status: DC
  Filled 2024-10-15: qty 28.35, 10d supply, fill #0

## 2024-10-15 MED ORDER — FUROSEMIDE 20 MG PO TABS
40.0000 mg | ORAL_TABLET | Freq: Every day | ORAL | 2 refills | Status: DC
  Filled 2024-10-15: qty 60, 30d supply, fill #0
  Filled 2024-11-19: qty 60, 30d supply, fill #1
  Filled 2024-12-24: qty 60, 30d supply, fill #2

## 2024-10-16 ENCOUNTER — Other Ambulatory Visit (HOSPITAL_COMMUNITY): Payer: Self-pay

## 2024-10-16 ENCOUNTER — Other Ambulatory Visit: Payer: Self-pay

## 2024-10-16 DIAGNOSIS — I5020 Unspecified systolic (congestive) heart failure: Secondary | ICD-10-CM | POA: Diagnosis not present

## 2024-10-17 DIAGNOSIS — I5020 Unspecified systolic (congestive) heart failure: Secondary | ICD-10-CM | POA: Diagnosis not present

## 2024-10-18 DIAGNOSIS — I5020 Unspecified systolic (congestive) heart failure: Secondary | ICD-10-CM | POA: Diagnosis not present

## 2024-10-20 DIAGNOSIS — I5020 Unspecified systolic (congestive) heart failure: Secondary | ICD-10-CM | POA: Diagnosis not present

## 2024-10-21 DIAGNOSIS — I5020 Unspecified systolic (congestive) heart failure: Secondary | ICD-10-CM | POA: Diagnosis not present

## 2024-10-22 DIAGNOSIS — I5020 Unspecified systolic (congestive) heart failure: Secondary | ICD-10-CM | POA: Diagnosis not present

## 2024-10-23 DIAGNOSIS — I5020 Unspecified systolic (congestive) heart failure: Secondary | ICD-10-CM | POA: Diagnosis not present

## 2024-10-24 DIAGNOSIS — I5020 Unspecified systolic (congestive) heart failure: Secondary | ICD-10-CM | POA: Diagnosis not present

## 2024-10-25 DIAGNOSIS — I5020 Unspecified systolic (congestive) heart failure: Secondary | ICD-10-CM | POA: Diagnosis not present

## 2024-10-27 DIAGNOSIS — I5020 Unspecified systolic (congestive) heart failure: Secondary | ICD-10-CM | POA: Diagnosis not present

## 2024-10-28 DIAGNOSIS — I5020 Unspecified systolic (congestive) heart failure: Secondary | ICD-10-CM | POA: Diagnosis not present

## 2024-10-29 DIAGNOSIS — I5020 Unspecified systolic (congestive) heart failure: Secondary | ICD-10-CM | POA: Diagnosis not present

## 2024-10-30 DIAGNOSIS — I5020 Unspecified systolic (congestive) heart failure: Secondary | ICD-10-CM | POA: Diagnosis not present

## 2024-10-31 DIAGNOSIS — I5020 Unspecified systolic (congestive) heart failure: Secondary | ICD-10-CM | POA: Diagnosis not present

## 2024-11-01 DIAGNOSIS — I5020 Unspecified systolic (congestive) heart failure: Secondary | ICD-10-CM | POA: Diagnosis not present

## 2024-11-02 DIAGNOSIS — I5020 Unspecified systolic (congestive) heart failure: Secondary | ICD-10-CM | POA: Diagnosis not present

## 2024-11-03 DIAGNOSIS — I5020 Unspecified systolic (congestive) heart failure: Secondary | ICD-10-CM | POA: Diagnosis not present

## 2024-11-04 DIAGNOSIS — I5020 Unspecified systolic (congestive) heart failure: Secondary | ICD-10-CM | POA: Diagnosis not present

## 2024-11-05 DIAGNOSIS — I5020 Unspecified systolic (congestive) heart failure: Secondary | ICD-10-CM | POA: Diagnosis not present

## 2024-11-06 DIAGNOSIS — I5020 Unspecified systolic (congestive) heart failure: Secondary | ICD-10-CM | POA: Diagnosis not present

## 2024-11-07 ENCOUNTER — Other Ambulatory Visit: Payer: Self-pay | Admitting: Family Medicine

## 2024-11-07 ENCOUNTER — Other Ambulatory Visit (HOSPITAL_COMMUNITY): Payer: Self-pay

## 2024-11-07 DIAGNOSIS — I5020 Unspecified systolic (congestive) heart failure: Secondary | ICD-10-CM | POA: Diagnosis not present

## 2024-11-08 ENCOUNTER — Other Ambulatory Visit (HOSPITAL_COMMUNITY): Payer: Self-pay

## 2024-11-08 ENCOUNTER — Telehealth: Payer: Self-pay | Admitting: *Deleted

## 2024-11-08 ENCOUNTER — Other Ambulatory Visit: Payer: Self-pay

## 2024-11-08 DIAGNOSIS — I5020 Unspecified systolic (congestive) heart failure: Secondary | ICD-10-CM | POA: Diagnosis not present

## 2024-11-08 MED ORDER — POTASSIUM CHLORIDE CRYS ER 20 MEQ PO TBCR
20.0000 meq | EXTENDED_RELEASE_TABLET | Freq: Three times a day (TID) | ORAL | 0 refills | Status: AC
  Filled 2024-11-08: qty 270, 90d supply, fill #0

## 2024-11-08 NOTE — Telephone Encounter (Signed)
 Pt called stating that the nodule on her abdomen has increased in size since Sept [facility] visit as well as a new nodule in left axilla. Due to [facility] availability, first appt will be with Dr. Loretha on Dec 12th at 115pm. Pt verbalized understanding.

## 2024-11-09 DIAGNOSIS — I5020 Unspecified systolic (congestive) heart failure: Secondary | ICD-10-CM | POA: Diagnosis not present

## 2024-11-10 DIAGNOSIS — I5020 Unspecified systolic (congestive) heart failure: Secondary | ICD-10-CM | POA: Diagnosis not present

## 2024-11-11 DIAGNOSIS — I5020 Unspecified systolic (congestive) heart failure: Secondary | ICD-10-CM | POA: Diagnosis not present

## 2024-11-12 DIAGNOSIS — I5020 Unspecified systolic (congestive) heart failure: Secondary | ICD-10-CM | POA: Diagnosis not present

## 2024-11-13 DIAGNOSIS — I5020 Unspecified systolic (congestive) heart failure: Secondary | ICD-10-CM | POA: Diagnosis not present

## 2024-11-14 DIAGNOSIS — I5020 Unspecified systolic (congestive) heart failure: Secondary | ICD-10-CM | POA: Diagnosis not present

## 2024-11-15 DIAGNOSIS — I5020 Unspecified systolic (congestive) heart failure: Secondary | ICD-10-CM | POA: Diagnosis not present

## 2024-11-16 DIAGNOSIS — I5020 Unspecified systolic (congestive) heart failure: Secondary | ICD-10-CM | POA: Diagnosis not present

## 2024-11-17 ENCOUNTER — Other Ambulatory Visit (HOSPITAL_COMMUNITY): Payer: Self-pay

## 2024-11-17 DIAGNOSIS — I5020 Unspecified systolic (congestive) heart failure: Secondary | ICD-10-CM | POA: Diagnosis not present

## 2024-11-18 DIAGNOSIS — I5020 Unspecified systolic (congestive) heart failure: Secondary | ICD-10-CM | POA: Diagnosis not present

## 2024-11-19 ENCOUNTER — Other Ambulatory Visit (HOSPITAL_COMMUNITY): Payer: Self-pay

## 2024-11-19 ENCOUNTER — Other Ambulatory Visit: Payer: Self-pay

## 2024-11-19 ENCOUNTER — Other Ambulatory Visit: Payer: Self-pay | Admitting: Family Medicine

## 2024-11-19 DIAGNOSIS — I5020 Unspecified systolic (congestive) heart failure: Secondary | ICD-10-CM | POA: Diagnosis not present

## 2024-11-19 MED ORDER — LOSARTAN POTASSIUM 50 MG PO TABS
50.0000 mg | ORAL_TABLET | Freq: Every day | ORAL | 5 refills | Status: AC
  Filled 2024-11-19: qty 30, 30d supply, fill #0
  Filled 2025-01-01: qty 30, 30d supply, fill #1

## 2024-11-20 ENCOUNTER — Inpatient Hospital Stay: Admitting: Hematology and Oncology

## 2024-11-20 DIAGNOSIS — I5020 Unspecified systolic (congestive) heart failure: Secondary | ICD-10-CM | POA: Diagnosis not present

## 2024-11-21 DIAGNOSIS — I5020 Unspecified systolic (congestive) heart failure: Secondary | ICD-10-CM | POA: Diagnosis not present

## 2024-11-22 DIAGNOSIS — I5020 Unspecified systolic (congestive) heart failure: Secondary | ICD-10-CM | POA: Diagnosis not present

## 2024-11-23 DIAGNOSIS — I5020 Unspecified systolic (congestive) heart failure: Secondary | ICD-10-CM | POA: Diagnosis not present

## 2024-11-24 DIAGNOSIS — I5020 Unspecified systolic (congestive) heart failure: Secondary | ICD-10-CM | POA: Diagnosis not present

## 2024-11-25 DIAGNOSIS — I5020 Unspecified systolic (congestive) heart failure: Secondary | ICD-10-CM | POA: Diagnosis not present

## 2024-11-27 ENCOUNTER — Encounter: Admitting: Family Medicine

## 2024-11-28 DIAGNOSIS — I5020 Unspecified systolic (congestive) heart failure: Secondary | ICD-10-CM | POA: Diagnosis not present

## 2024-11-29 ENCOUNTER — Telehealth: Payer: Self-pay | Admitting: Hematology and Oncology

## 2024-11-29 DIAGNOSIS — I5020 Unspecified systolic (congestive) heart failure: Secondary | ICD-10-CM | POA: Diagnosis not present

## 2024-11-29 NOTE — Telephone Encounter (Signed)
 I spoke with patient to reschedule cancelled appointment on 11/30/2024. Patient aware of appointment on 12/28/2024.

## 2024-11-30 ENCOUNTER — Inpatient Hospital Stay: Admitting: Hematology and Oncology

## 2024-12-01 DIAGNOSIS — I5020 Unspecified systolic (congestive) heart failure: Secondary | ICD-10-CM | POA: Diagnosis not present

## 2024-12-02 DIAGNOSIS — I5020 Unspecified systolic (congestive) heart failure: Secondary | ICD-10-CM | POA: Diagnosis not present

## 2024-12-03 ENCOUNTER — Other Ambulatory Visit: Payer: Self-pay | Admitting: Family Medicine

## 2024-12-03 ENCOUNTER — Other Ambulatory Visit: Payer: Self-pay

## 2024-12-03 ENCOUNTER — Other Ambulatory Visit (HOSPITAL_COMMUNITY): Payer: Self-pay

## 2024-12-03 MED ORDER — HYDROCORTISONE 2.5 % EX OINT
TOPICAL_OINTMENT | Freq: Two times a day (BID) | CUTANEOUS | 0 refills | Status: DC
  Filled 2024-12-03: qty 28.35, 30d supply, fill #0

## 2024-12-03 MED ORDER — EMPAGLIFLOZIN 10 MG PO TABS
10.0000 mg | ORAL_TABLET | Freq: Every day | ORAL | 2 refills | Status: AC
  Filled 2024-12-03: qty 30, 30d supply, fill #0
  Filled 2025-01-01: qty 30, 30d supply, fill #1

## 2024-12-03 MED ORDER — FLUTICASONE PROPIONATE 50 MCG/ACT NA SUSP
1.0000 | Freq: Every day | NASAL | 2 refills | Status: AC
  Filled 2024-12-03: qty 16, 34d supply, fill #0

## 2024-12-04 ENCOUNTER — Other Ambulatory Visit: Payer: Self-pay

## 2024-12-24 ENCOUNTER — Other Ambulatory Visit (HOSPITAL_COMMUNITY): Payer: Self-pay

## 2024-12-27 ENCOUNTER — Inpatient Hospital Stay: Admitting: Hematology and Oncology

## 2024-12-28 ENCOUNTER — Telehealth: Payer: Self-pay | Admitting: Hematology and Oncology

## 2024-12-28 ENCOUNTER — Inpatient Hospital Stay: Attending: Hematology and Oncology | Admitting: Hematology and Oncology

## 2024-12-28 ENCOUNTER — Other Ambulatory Visit (HOSPITAL_COMMUNITY): Payer: Self-pay

## 2024-12-28 VITALS — BP 120/78 | HR 83 | Temp 97.9°F | Resp 17 | Wt 213.8 lb

## 2024-12-28 DIAGNOSIS — C50412 Malignant neoplasm of upper-outer quadrant of left female breast: Secondary | ICD-10-CM | POA: Diagnosis not present

## 2024-12-28 NOTE — Assessment & Plan Note (Signed)
 [name] is a 64 year old woman with history of stage IIIa triple positive breast cancer status post bilateral mastectomies followed by chemotherapy and maintenance Herceptin  along with adjuvant radiation and tamoxifen  which she took for 10 years beginning August 2013.  She comes in today saying the left chest wall looks different and left abdomen appears swollen At the site of left mastectomy scar,there is very small area of dehiscence with fluid filled blisters, no firm nodules concerning for recurrence I dont feel anything abnormal in the abdomen. Recommended systemic imaging to rule out recurrence because of aggressive biology of her breast cancer With regards to pain medication, I once again told her we will not be giving her pain medication. She requested if I can reach out to palliative care to discuss with them. She also requested a referral to PT, referral sent  We will schedule a telephone visit in a month to review CT results
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
## 2024-12-28 NOTE — Telephone Encounter (Signed)
 I left voicemail for patient to return my call if telephone visit on 01/30/2025 at 1:00 pm does not work for her schedule.

## 2025-01-01 ENCOUNTER — Other Ambulatory Visit: Payer: Self-pay | Admitting: Family Medicine

## 2025-01-01 ENCOUNTER — Other Ambulatory Visit (HOSPITAL_COMMUNITY): Payer: Self-pay

## 2025-01-01 NOTE — Progress Notes (Unsigned)
 "  SUBJECTIVE:   CHIEF COMPLAINT / HPI:  [name] is a 64 y.o. female with a pertinent past medical history of CHF, HTN, Hx STEMI, and breast cancer Hx presenting to the clinic for physical exam and sinus infection.  Sinus infection Patient complains of nasal congestion and rhinorrhea with nasal drainage. This AM, patient had thick nasal discharge. Symptoms began 9 days ago. Patient denies: chest pain, chills, fever, hemoptysis, shortness of breath, and wheezing. Patient does not have new pets.  Patient does not have a history of asthma. Patient does not have a history of environmental allergens. Patient has not recent travel.  Patient does have a history of smoking; patient is smoking 7 cigarettes a day. Patient  has not had previous Chest X-ray. Patient has not had a PPD done.  Combined systolic and diastolic congestive heart failure Last echo October 2024 with EF 40-45%. GDMT: Losartan  50 mg, Jardiance  10 mg, spironolactone  25 mg, no beta blocker. Diuretic: Lasix  40 mg daily, extra tab if gaining weight. Patient reports she weighs herself regularly. Diet includes limiting sodium. Patient dose follow with Cardiology, last seen November 2024. Patient has home health for nursing support at home.  Hypertension - BP: (!) 128/104 today. - Home medications include: Losartan  10 mg, spironolactone  25 mg, Jardiance  10 mg, Imdur  50 mg QHS. - She endorses taking these medications as prescribed. - Does not check blood pressure at home. - Denies hypotension symptoms including dizziness and orthostatic symptoms. - Denies headache, vision changes, LUQ pain, hematuria, chest pain.  Breast cancer Hx Diagnosed in 2012. Status post bilateral mastectomy. Has a new nodule, following up with Oncology on 1/23 for CT and further workup. Patient goes to outpatient cancer rehab. Patient uses hydrocortisone  at her mastectomy site intermittently and requires a refill.  Colorectal cancer  screening Colonoscopy/Cologuard due.   PERTINENT PMH / PSH: STEMI ~2008 Breast cancer 2012 s/p bilateral mastectomy, now with concern for recurrence CAD, HTN, prediabetes Combined systolic/diastolic HF  *Remainder reviewed in problem list.   OBJECTIVE:   BP (!) 128/104   Pulse 83   Ht 5' 5 (1.651 m)   Wt 219 lb (99.3 kg)   LMP 07/16/2011   SpO2 95%   BMI 36.44 kg/m   General: Age-appropriate, resting in chair in mild discomfort, NAD, alert and at baseline. HEENT:  Head: Normocephalic, atraumatic. Tenderness to percussion over maxillary sinuses. Eyes: PERRLA. No conjunctival erythema or scleral injections. Ears: TMs non-bulging and non-erythematous bilaterally. No erythema of external ear canal. No cerumen impaction. Nose: Erythematous turbinates with copious rhinorrhea. Mouth/Oral: Clear, no tonsillar exudate. MMM. Neck: Supple. No LAD. Cardiovascular: No JVD. Regular rate and rhythm. Normal S1/S2. No murmurs, rubs, or gallops appreciated. 2+ radial pulses. Pulmonary: Clear bilaterally to ascultation. No wheezes, crackles, or rhonchi. Normal WOB on room air. No accessory muscle use. Extremities: No peripheral edema bilaterally. Capillary refill <2 seconds.   ASSESSMENT/PLAN:   Assessment & Plan Chronic combined systolic and diastolic congestive heart failure (HCC) Currently e clinically uvolemic. - Continue Lasix  40 mg daily - Losartan  10 mg, spironolactone  25 mg, Jardiance  10 mg - Continue frequent weighing at home - BMP as future ordered due to lab closure, return 1/28 - Will fill out home health nursing form when available in inbox, as patient does have medical indication for home health nursing support and struggles to leave the home for frequent monitoring required Acute non-recurrent sinusitis, unspecified location 9 days of sinus symptoms consistent with sinusitis. - Augmentin  875-125 twice  daily x 10 days Primary hypertension Not adequately controlled  today, however patient in acute distress due to sinusitis.  Will plan to recheck in 1 month prior to adjusting meds. - Losartan  10 mg, spironolactone  25 mg, Jardiance  10 mg, Imdur  50 mg QHS Prediabetes - A1c is future ordered due to lab closure, return 1/28 Colon cancer screening - Cologuard ordered to patient's home  Savannah Karwowski Toma, MD [facility] "

## 2025-01-02 ENCOUNTER — Encounter: Payer: Self-pay | Admitting: Family Medicine

## 2025-01-02 ENCOUNTER — Other Ambulatory Visit (HOSPITAL_COMMUNITY): Payer: Self-pay

## 2025-01-02 ENCOUNTER — Other Ambulatory Visit: Payer: Self-pay

## 2025-01-02 ENCOUNTER — Ambulatory Visit: Payer: Self-pay | Admitting: Family Medicine

## 2025-01-02 ENCOUNTER — Telehealth: Payer: Self-pay | Admitting: Nurse Practitioner

## 2025-01-02 VITALS — BP 128/104 | HR 83 | Ht 65.0 in | Wt 219.0 lb

## 2025-01-02 DIAGNOSIS — R7303 Prediabetes: Secondary | ICD-10-CM | POA: Diagnosis not present

## 2025-01-02 DIAGNOSIS — I5042 Chronic combined systolic (congestive) and diastolic (congestive) heart failure: Secondary | ICD-10-CM

## 2025-01-02 DIAGNOSIS — J019 Acute sinusitis, unspecified: Secondary | ICD-10-CM | POA: Diagnosis not present

## 2025-01-02 DIAGNOSIS — Z1211 Encounter for screening for malignant neoplasm of colon: Secondary | ICD-10-CM

## 2025-01-02 DIAGNOSIS — I10 Essential (primary) hypertension: Secondary | ICD-10-CM | POA: Diagnosis not present

## 2025-01-02 MED ORDER — AMOXICILLIN-POT CLAVULANATE 875-125 MG PO TABS
1.0000 | ORAL_TABLET | Freq: Two times a day (BID) | ORAL | 0 refills | Status: AC
  Filled 2025-01-02: qty 20, 10d supply, fill #0

## 2025-01-02 MED ORDER — HYDROCORTISONE 2.5 % EX OINT
TOPICAL_OINTMENT | Freq: Two times a day (BID) | CUTANEOUS | 0 refills | Status: AC
  Filled 2025-01-02: qty 28.35, 10d supply, fill #0

## 2025-01-02 NOTE — Assessment & Plan Note (Signed)
-   A1c is future ordered due to lab closure, return 1/28

## 2025-01-02 NOTE — Assessment & Plan Note (Signed)
 Currently e clinically uvolemic. - Continue Lasix  40 mg daily - Losartan  10 mg, spironolactone  25 mg, Jardiance  10 mg - Continue frequent weighing at home - BMP as future ordered due to lab closure, return 1/28 - Will fill out home health nursing form when available in inbox, as patient does have medical indication for home health nursing support and struggles to leave the home for frequent monitoring required

## 2025-01-02 NOTE — Assessment & Plan Note (Signed)
 Not adequately controlled today, however patient in acute distress due to sinusitis.  Will plan to recheck in 1 month prior to adjusting meds. - Losartan  10 mg, spironolactone  25 mg, Jardiance  10 mg, Imdur  50 mg QHS

## 2025-01-02 NOTE — Patient Instructions (Signed)
 It was great to see you today! Thank you for choosing [facility] for your primary care.  Today we addressed: Sinus infection I have prescribed you an antibiotic course of Augmentin  which you should take twice daily for the next 10 days. Please return if your symptoms are worsening or you are having new fevers.  Colon cancer screening You will get a Cologuard test delivered to your house for colon cancer screening.  Please complete the test.  Heart failure Please call and schedule appoint with your cardiologist, you have not seen them in over a year. Please continue weighing yourself and call us  if you notice a weight change of more than 3 pounds in a day or 5 pounds in 3 days.  We are checking some labs today, including metabolic panel, diabetes screening.  You will get a MyChart message or a letter if results are normal. Otherwise, you will get a call from us .  You should return to our clinic in 3 months.  Thank you for coming to see us  at [facility] and for the opportunity to care for you! Jilliane Kazanjian, MD 01/02/2025, 11:51 AM

## 2025-01-03 ENCOUNTER — Inpatient Hospital Stay: Admitting: Hematology and Oncology

## 2025-01-09 ENCOUNTER — Telehealth: Payer: Self-pay | Admitting: Family Medicine

## 2025-01-09 ENCOUNTER — Other Ambulatory Visit: Payer: Self-pay

## 2025-01-09 DIAGNOSIS — Z515 Encounter for palliative care: Secondary | ICD-10-CM

## 2025-01-09 DIAGNOSIS — F1120 Opioid dependence, uncomplicated: Secondary | ICD-10-CM

## 2025-01-09 NOTE — Telephone Encounter (Signed)
-----   Message from [employer] sent at 01/09/2025  6:59 PM EST ----- Regarding: PCS form Received PCS form in inbox, need to discuss details with patient concerning ADL needs.  Patient did not respond to initial call today.

## 2025-01-09 NOTE — Progress Notes (Signed)
 Orders placed for lab appt 01/10/2025

## 2025-01-10 ENCOUNTER — Other Ambulatory Visit: Payer: Self-pay

## 2025-01-10 ENCOUNTER — Inpatient Hospital Stay: Admitting: Nurse Practitioner

## 2025-01-10 ENCOUNTER — Inpatient Hospital Stay

## 2025-01-10 ENCOUNTER — Ambulatory Visit

## 2025-01-10 ENCOUNTER — Encounter: Payer: Self-pay | Admitting: Family Medicine

## 2025-01-10 DIAGNOSIS — R251 Tremor, unspecified: Secondary | ICD-10-CM | POA: Insufficient documentation

## 2025-01-10 DIAGNOSIS — Z515 Encounter for palliative care: Secondary | ICD-10-CM

## 2025-01-10 DIAGNOSIS — R2681 Unsteadiness on feet: Secondary | ICD-10-CM | POA: Insufficient documentation

## 2025-01-10 DIAGNOSIS — F1120 Opioid dependence, uncomplicated: Secondary | ICD-10-CM

## 2025-01-10 LAB — URINE DRUG SCREEN
Amphetamines: NEGATIVE
Barbiturates: NEGATIVE
Benzodiazepines: NEGATIVE
Cocaine: POSITIVE — AB
Fentanyl: NEGATIVE
Methadone Scn, Ur: NEGATIVE
Opiates: NEGATIVE
Tetrahydrocannabinol: NEGATIVE

## 2025-01-10 NOTE — Progress Notes (Signed)
 Addendum 01/10/2025 for clarification:  ADL needs x3: Needs assistance with dressing because of lymphedema, cannot raise arm properly, lymphedema present since mastectomy and cancer treatment in 2012. Needs assistance with bathing because of bilateral hand tremors and lymphedema (cannot reach back and other areas). Needs assistance with feeding because of bilateral hand tremors (hand shakes too much and she drops food).   Has gait instability and requires a cane, has fallen a few times. Shortness of breath secondary to CHF interferes with ambulation as well.  Medicaid PCS filled out and placed for faxing on 01/10/2025, see media tab.

## 2025-01-10 NOTE — Telephone Encounter (Signed)
 Spoke with patient, verified DOB.  ADL needs (3/5): Needs assistance with dressing because of lymphedema, cannot raise arm properly. Needs assistance with bathing because of tremors and lymphedema (cannot reach back and other areas). Needs assistance with feeding because of hand tremors (hand shakes too much and she drops food).  Has gait instability and requires a cane, has fallen a few times.

## 2025-01-11 ENCOUNTER — Ambulatory Visit (HOSPITAL_COMMUNITY)

## 2025-01-16 ENCOUNTER — Other Ambulatory Visit: Payer: Self-pay

## 2025-01-21 NOTE — Therapy (Deleted)
.  [facility]

## 2025-01-22 ENCOUNTER — Ambulatory Visit

## 2025-01-25 ENCOUNTER — Other Ambulatory Visit (HOSPITAL_COMMUNITY): Payer: Self-pay

## 2025-01-25 ENCOUNTER — Other Ambulatory Visit: Payer: Self-pay

## 2025-01-25 ENCOUNTER — Other Ambulatory Visit: Payer: Self-pay | Admitting: Family Medicine

## 2025-01-25 MED ORDER — FUROSEMIDE 20 MG PO TABS
40.0000 mg | ORAL_TABLET | Freq: Every day | ORAL | 2 refills | Status: AC
  Filled 2025-01-25: qty 60, 30d supply, fill #0

## 2025-01-28 ENCOUNTER — Ambulatory Visit (HOSPITAL_COMMUNITY)

## 2025-01-30 ENCOUNTER — Inpatient Hospital Stay: Attending: Hematology and Oncology | Admitting: Hematology and Oncology

## 2025-02-13 ENCOUNTER — Ambulatory Visit: Payer: PRIVATE HEALTH INSURANCE
